# Patient Record
Sex: Female | Born: 1965 | Race: White | Hispanic: No | Smoking: Former smoker
Health system: Southern US, Community
[De-identification: ages and names within clinical notes are randomized; demographics above are authoritative.]

## PROBLEM LIST (undated history)

## (undated) DIAGNOSIS — J45909 Unspecified asthma, uncomplicated: Secondary | ICD-10-CM

## (undated) DIAGNOSIS — E079 Disorder of thyroid, unspecified: Secondary | ICD-10-CM

## (undated) DIAGNOSIS — U071 COVID-19: Secondary | ICD-10-CM

## (undated) DIAGNOSIS — I10 Essential (primary) hypertension: Secondary | ICD-10-CM

## (undated) DIAGNOSIS — F419 Anxiety disorder, unspecified: Secondary | ICD-10-CM

## (undated) DIAGNOSIS — E039 Hypothyroidism, unspecified: Secondary | ICD-10-CM

## (undated) DIAGNOSIS — D472 Monoclonal gammopathy: Secondary | ICD-10-CM

## (undated) DIAGNOSIS — K219 Gastro-esophageal reflux disease without esophagitis: Secondary | ICD-10-CM

## (undated) DIAGNOSIS — M1990 Unspecified osteoarthritis, unspecified site: Secondary | ICD-10-CM

## (undated) DIAGNOSIS — F32A Depression, unspecified: Secondary | ICD-10-CM

## (undated) DIAGNOSIS — J189 Pneumonia, unspecified organism: Secondary | ICD-10-CM

## (undated) DIAGNOSIS — F329 Major depressive disorder, single episode, unspecified: Secondary | ICD-10-CM

## (undated) HISTORY — DX: Depression, unspecified: F32.A

## (undated) HISTORY — DX: Anxiety disorder, unspecified: F41.9

## (undated) HISTORY — DX: Essential (primary) hypertension: I10

## (undated) HISTORY — PX: BREAST SURGERY: SHX581

## (undated) HISTORY — PX: HERNIA REPAIR: SHX51

## (undated) HISTORY — DX: Disorder of thyroid, unspecified: E07.9

## (undated) HISTORY — DX: Major depressive disorder, single episode, unspecified: F32.9

## (undated) HISTORY — PX: TOTAL THYROIDECTOMY: SHX2547

## (undated) HISTORY — PX: OTHER SURGICAL HISTORY: SHX169

## (undated) HISTORY — DX: Gastro-esophageal reflux disease without esophagitis: K21.9

## (undated) HISTORY — DX: Monoclonal gammopathy: D47.2

## (undated) HISTORY — DX: Unspecified osteoarthritis, unspecified site: M19.90

## (undated) HISTORY — DX: COVID-19: U07.1

## (undated) HISTORY — PX: NASAL SINUS SURGERY: SHX719

## (undated) HISTORY — PX: TUBAL LIGATION: SHX77

## (undated) HISTORY — PX: ENDOMETRIAL ABLATION: SHX621

## (undated) HISTORY — DX: Unspecified asthma, uncomplicated: J45.909

## (undated) MED FILL — Iron Sucrose Inj 20 MG/ML (Fe Equiv): INTRAVENOUS | Qty: 10 | Status: AC

---

## 1996-06-17 HISTORY — PX: BREAST SURGERY: SHX581

## 1996-06-17 HISTORY — PX: BREAST CYST ASPIRATION: SHX578

## 2003-06-18 HISTORY — PX: GASTRIC BYPASS: SHX52

## 2004-06-17 HISTORY — PX: ABLATION: SHX5711

## 2005-09-20 ENCOUNTER — Emergency Department: Payer: Self-pay | Admitting: Emergency Medicine

## 2006-02-06 ENCOUNTER — Ambulatory Visit: Payer: Self-pay | Admitting: Obstetrics and Gynecology

## 2006-02-11 ENCOUNTER — Ambulatory Visit: Payer: Self-pay | Admitting: Internal Medicine

## 2006-02-15 ENCOUNTER — Ambulatory Visit: Payer: Self-pay | Admitting: Internal Medicine

## 2006-03-17 ENCOUNTER — Ambulatory Visit: Payer: Self-pay | Admitting: Internal Medicine

## 2006-04-17 ENCOUNTER — Ambulatory Visit: Payer: Self-pay | Admitting: Internal Medicine

## 2006-06-13 ENCOUNTER — Ambulatory Visit: Payer: Self-pay | Admitting: Internal Medicine

## 2006-06-17 ENCOUNTER — Ambulatory Visit: Payer: Self-pay | Admitting: Internal Medicine

## 2006-08-16 ENCOUNTER — Ambulatory Visit: Payer: Self-pay | Admitting: Internal Medicine

## 2006-08-26 ENCOUNTER — Ambulatory Visit: Payer: Self-pay | Admitting: Oncology

## 2006-09-16 ENCOUNTER — Ambulatory Visit: Payer: Self-pay | Admitting: Internal Medicine

## 2006-09-16 ENCOUNTER — Ambulatory Visit: Payer: Self-pay | Admitting: Oncology

## 2006-10-21 ENCOUNTER — Ambulatory Visit: Payer: Self-pay | Admitting: Internal Medicine

## 2006-11-16 ENCOUNTER — Ambulatory Visit: Payer: Self-pay | Admitting: Internal Medicine

## 2006-12-16 ENCOUNTER — Ambulatory Visit: Payer: Self-pay | Admitting: Internal Medicine

## 2007-01-16 ENCOUNTER — Ambulatory Visit: Payer: Self-pay | Admitting: Internal Medicine

## 2007-01-26 ENCOUNTER — Emergency Department: Payer: Self-pay | Admitting: Emergency Medicine

## 2007-03-10 ENCOUNTER — Ambulatory Visit: Payer: Self-pay | Admitting: Internal Medicine

## 2007-03-18 ENCOUNTER — Ambulatory Visit: Payer: Self-pay | Admitting: Internal Medicine

## 2007-06-02 ENCOUNTER — Ambulatory Visit: Payer: Self-pay | Admitting: Internal Medicine

## 2007-06-18 ENCOUNTER — Ambulatory Visit: Payer: Self-pay | Admitting: Internal Medicine

## 2007-08-16 ENCOUNTER — Ambulatory Visit: Payer: Self-pay | Admitting: Internal Medicine

## 2007-08-24 ENCOUNTER — Ambulatory Visit: Payer: Self-pay | Admitting: Internal Medicine

## 2007-09-16 ENCOUNTER — Ambulatory Visit: Payer: Self-pay | Admitting: Internal Medicine

## 2007-11-24 ENCOUNTER — Ambulatory Visit: Payer: Self-pay | Admitting: Internal Medicine

## 2007-12-16 ENCOUNTER — Ambulatory Visit: Payer: Self-pay | Admitting: Internal Medicine

## 2008-02-26 ENCOUNTER — Ambulatory Visit: Payer: Self-pay | Admitting: Internal Medicine

## 2008-03-17 ENCOUNTER — Ambulatory Visit: Payer: Self-pay | Admitting: Internal Medicine

## 2008-04-12 ENCOUNTER — Emergency Department: Payer: Self-pay | Admitting: Emergency Medicine

## 2008-08-15 ENCOUNTER — Ambulatory Visit: Payer: Self-pay | Admitting: Internal Medicine

## 2008-08-24 ENCOUNTER — Ambulatory Visit: Payer: Self-pay | Admitting: Internal Medicine

## 2008-09-15 ENCOUNTER — Ambulatory Visit: Payer: Self-pay | Admitting: Internal Medicine

## 2008-11-18 ENCOUNTER — Ambulatory Visit: Payer: Self-pay | Admitting: Internal Medicine

## 2008-12-15 ENCOUNTER — Ambulatory Visit: Payer: Self-pay | Admitting: Internal Medicine

## 2009-02-13 ENCOUNTER — Ambulatory Visit: Payer: Self-pay | Admitting: Internal Medicine

## 2009-02-15 ENCOUNTER — Ambulatory Visit: Payer: Self-pay | Admitting: Internal Medicine

## 2009-05-10 ENCOUNTER — Ambulatory Visit: Payer: Self-pay | Admitting: Internal Medicine

## 2009-05-17 ENCOUNTER — Ambulatory Visit: Payer: Self-pay | Admitting: Internal Medicine

## 2009-07-18 ENCOUNTER — Ambulatory Visit: Payer: Self-pay | Admitting: Internal Medicine

## 2009-08-02 ENCOUNTER — Ambulatory Visit: Payer: Self-pay | Admitting: Internal Medicine

## 2009-08-15 ENCOUNTER — Ambulatory Visit: Payer: Self-pay | Admitting: Internal Medicine

## 2009-11-01 ENCOUNTER — Ambulatory Visit: Payer: Self-pay | Admitting: Internal Medicine

## 2009-11-15 ENCOUNTER — Ambulatory Visit: Payer: Self-pay | Admitting: Internal Medicine

## 2010-01-17 ENCOUNTER — Ambulatory Visit: Payer: Self-pay | Admitting: Internal Medicine

## 2010-02-15 ENCOUNTER — Ambulatory Visit: Payer: Self-pay | Admitting: Internal Medicine

## 2010-04-11 ENCOUNTER — Ambulatory Visit: Payer: Self-pay | Admitting: Internal Medicine

## 2010-04-17 ENCOUNTER — Ambulatory Visit: Payer: Self-pay | Admitting: Internal Medicine

## 2010-07-13 ENCOUNTER — Ambulatory Visit: Payer: Self-pay | Admitting: Internal Medicine

## 2010-07-18 ENCOUNTER — Ambulatory Visit: Payer: Self-pay | Admitting: Internal Medicine

## 2010-10-08 ENCOUNTER — Ambulatory Visit: Payer: Self-pay | Admitting: Internal Medicine

## 2010-10-16 ENCOUNTER — Ambulatory Visit: Payer: Self-pay | Admitting: Internal Medicine

## 2011-01-13 ENCOUNTER — Ambulatory Visit: Payer: Self-pay | Admitting: Internal Medicine

## 2011-04-04 ENCOUNTER — Ambulatory Visit: Payer: Self-pay | Admitting: Internal Medicine

## 2011-04-18 ENCOUNTER — Ambulatory Visit: Payer: Self-pay | Admitting: Internal Medicine

## 2011-04-22 LAB — HM MAMMOGRAPHY: HM Mammogram: NORMAL

## 2011-07-02 ENCOUNTER — Ambulatory Visit: Payer: Self-pay | Admitting: Internal Medicine

## 2011-07-02 LAB — CBC CANCER CENTER
Comment - H1-Com2: NORMAL
Eosinophil: 1 %
Lymphocytes: 35 %
MCV: 90 fL (ref 80–100)
Monocytes: 7 %
Platelet: 311 x10 3/mm (ref 150–440)
RDW: 13.4 % (ref 11.5–14.5)
Segmented Neutrophils: 57 %
WBC: 8.2 x10 3/mm (ref 3.6–11.0)

## 2011-07-02 LAB — IRON AND TIBC
Iron Bind.Cap.(Total): 389 ug/dL (ref 250–450)
Iron: 112 ug/dL (ref 50–170)
Unbound Iron-Bind.Cap.: 277 ug/dL

## 2011-07-19 ENCOUNTER — Ambulatory Visit: Payer: Self-pay | Admitting: Internal Medicine

## 2011-09-09 ENCOUNTER — Ambulatory Visit: Payer: Self-pay | Admitting: Family

## 2011-10-10 ENCOUNTER — Ambulatory Visit: Payer: Self-pay | Admitting: Family

## 2011-11-22 ENCOUNTER — Ambulatory Visit: Payer: Self-pay | Admitting: Internal Medicine

## 2011-11-22 LAB — IRON AND TIBC
Iron Saturation: 18 %
Unbound Iron-Bind.Cap.: 336 ug/dL

## 2011-12-16 ENCOUNTER — Ambulatory Visit: Payer: Self-pay | Admitting: Internal Medicine

## 2012-02-14 ENCOUNTER — Ambulatory Visit: Payer: Self-pay | Admitting: Internal Medicine

## 2012-02-14 LAB — FERRITIN: Ferritin (ARMC): 31 ng/mL (ref 8–388)

## 2012-02-14 LAB — CANCER CENTER HEMOGLOBIN: HGB: 13 g/dL (ref 12.0–16.0)

## 2012-02-14 LAB — IRON AND TIBC
Iron Saturation: 32 %
Unbound Iron-Bind.Cap.: 249 ug/dL

## 2012-02-16 ENCOUNTER — Ambulatory Visit: Payer: Self-pay | Admitting: Internal Medicine

## 2012-02-24 ENCOUNTER — Encounter: Payer: Self-pay | Admitting: Internal Medicine

## 2012-03-11 ENCOUNTER — Ambulatory Visit: Payer: Self-pay | Admitting: Internal Medicine

## 2012-04-21 ENCOUNTER — Ambulatory Visit (INDEPENDENT_AMBULATORY_CARE_PROVIDER_SITE_OTHER): Payer: No Typology Code available for payment source | Admitting: Internal Medicine

## 2012-04-21 ENCOUNTER — Encounter: Payer: Self-pay | Admitting: Internal Medicine

## 2012-04-21 VITALS — BP 130/90 | HR 78 | Temp 98.1°F | Ht 63.25 in | Wt 231.0 lb

## 2012-04-21 DIAGNOSIS — E669 Obesity, unspecified: Secondary | ICD-10-CM

## 2012-04-21 DIAGNOSIS — F419 Anxiety disorder, unspecified: Secondary | ICD-10-CM | POA: Insufficient documentation

## 2012-04-21 DIAGNOSIS — F329 Major depressive disorder, single episode, unspecified: Secondary | ICD-10-CM | POA: Insufficient documentation

## 2012-04-21 DIAGNOSIS — G4700 Insomnia, unspecified: Secondary | ICD-10-CM

## 2012-04-21 DIAGNOSIS — F341 Dysthymic disorder: Secondary | ICD-10-CM | POA: Insufficient documentation

## 2012-04-21 DIAGNOSIS — F32A Depression, unspecified: Secondary | ICD-10-CM

## 2012-04-21 DIAGNOSIS — E039 Hypothyroidism, unspecified: Secondary | ICD-10-CM | POA: Insufficient documentation

## 2012-04-21 DIAGNOSIS — E890 Postprocedural hypothyroidism: Secondary | ICD-10-CM | POA: Insufficient documentation

## 2012-04-21 MED ORDER — ALPRAZOLAM 1 MG PO TABS: 1.0000 mg | ORAL_TABLET | Freq: Three times a day (TID) | ORAL | Status: DC | PRN

## 2012-04-21 MED ORDER — SERTRALINE HCL 25 MG PO TABS: 25.0000 mg | ORAL_TABLET | Freq: Every day | ORAL | Status: DC

## 2012-04-21 MED ORDER — ZOLPIDEM TARTRATE 10 MG PO TABS: 10.0000 mg | ORAL_TABLET | Freq: Every evening | ORAL | Status: DC | PRN

## 2012-04-21 NOTE — Progress Notes (Signed)
Subjective:    Patient ID: Paula Garrett, female    DOB: 05/13/1966, 46 y.o.   MRN: [id number]  HPI 46 year old female with history of hypothyroidism, obesity status post gastric bypass, anxiety/depression, and insomnia presents to establish care. She is tearful today describing recent issues with ongoing anxiety and depression. She reports that previous PCP changed her medication from Wellbutrin and Pristique to Wellbutrin and Effexor. She reports significant worsening of her symptoms of depression with this change. She also reports that she was previously on alprazolam and this was changed to Clonopin was significant worsening of anxiety. She uses this medication for panic attacks with little improvement. She reports that symptoms of depression and anxiety first began after thyroidectomy and gastric bypass surgery. Ongoing issues with anxiety and depression have worsened as she has gained weight after gastric bypass surgery. She also feels that limited sleep is playing a role in her symptoms. She works as a Industrial/product designer. Her shift varies from day-to-day. She often has difficulty sleeping, and in the past used Ambien with improvement. However, her last PCP took her off this medication and she is now having difficulty sleeping. She has difficulty mostly falling asleep. She notes fatigue during the day.  Outpatient Encounter Prescriptions as of 04/21/2012  Medication Sig Dispense Refill  . buPROPion (WELLBUTRIN XL) 300 MG 24 hr tablet Take 300 mg by mouth daily.      . calcitRIOL (ROCALTROL) 0.5 MCG capsule Take 0.5 mcg by mouth once a week.      . ergocalciferol (VITAMIN D2) 50000 UNITS capsule Take 50,000 Units by mouth once a week.      . folic acid (FOLVITE) 1 MG tablet Take 1 mg by mouth daily.      Marland Kitchen levothyroxine (SYNTHROID, LEVOTHROID) 200 MCG tablet Take 200 mcg by mouth daily.      . metroNIDAZOLE (METROGEL) 0.75 % gel Apply 1 application topically 2 (two) times daily.      . Multiple Vitamin  (MULTIVITAMIN) tablet Take 1 tablet by mouth daily.      Marland Kitchen omeprazole (PRILOSEC) 40 MG capsule Take 40 mg by mouth daily.      . traMADol (ULTRAM) 50 MG tablet Take 50 mg by mouth 2 (two) times daily as needed.      . vitamin B-12 (CYANOCOBALAMIN) 1000 MCG tablet Take 1,000 mcg by mouth daily.      . [DISCONTINUED] clonazePAM (KLONOPIN) 1 MG tablet Take 1 mg by mouth 2 (two) times daily.      . [DISCONTINUED] venlafaxine XR (EFFEXOR-XR) 150 MG 24 hr capsule Take 150 mg by mouth daily.      Marland Kitchen ALPRAZolam (XANAX) 1 MG tablet Take 1 tablet (1 mg total) by mouth 3 (three) times daily as needed for sleep or anxiety.  90 tablet  2  . sertraline (ZOLOFT) 25 MG tablet Take 1 tablet (25 mg total) by mouth daily.  30 tablet  3  . zolpidem (AMBIEN) 10 MG tablet Take 1 tablet (10 mg total) by mouth at bedtime as needed for sleep.  30 tablet  3    Review of Systems  Constitutional: Positive for fatigue. Negative for fever, chills, appetite change and unexpected weight change.  HENT: Negative for ear pain, congestion, sore throat, trouble swallowing, neck pain, voice change and sinus pressure.   Eyes: Negative for visual disturbance.  Respiratory: Negative for cough, shortness of breath, wheezing and stridor.   Cardiovascular: Negative for chest pain, palpitations and leg swelling.  Gastrointestinal: Negative for nausea,  vomiting, abdominal pain, diarrhea, constipation, blood in stool, abdominal distention and anal bleeding.  Genitourinary: Negative for dysuria and flank pain.  Musculoskeletal: Negative for myalgias, arthralgias and gait problem.  Skin: Negative for color change and rash.  Neurological: Negative for dizziness and headaches.  Hematological: Negative for adenopathy. Does not bruise/bleed easily.  Psychiatric/Behavioral: Positive for sleep disturbance and dysphoric mood. Negative for suicidal ideas. The patient is nervous/anxious.        Objective:   Physical Exam  Constitutional: She is  oriented to person, place, and time. She appears well-developed and well-nourished. No distress.  HENT:  Head: Normocephalic and atraumatic.  Right Ear: External ear normal.  Left Ear: External ear normal.  Nose: Nose normal.  Mouth/Throat: Oropharynx is clear and moist. No oropharyngeal exudate.  Eyes: Conjunctivae normal are normal. Pupils are equal, round, and reactive to light. Right eye exhibits no discharge. Left eye exhibits no discharge. No scleral icterus.  Neck: Normal range of motion. Neck supple. No tracheal deviation present. No thyromegaly present.  Cardiovascular: Normal rate, regular rhythm, normal heart sounds and intact distal pulses.  Exam reveals no gallop and no friction rub.   No murmur heard. Pulmonary/Chest: Effort normal and breath sounds normal. No respiratory distress. She has no wheezes. She has no rales. She exhibits no tenderness.  Musculoskeletal: Normal range of motion. She exhibits no edema and no tenderness.  Lymphadenopathy:    She has no cervical adenopathy.  Neurological: She is alert and oriented to person, place, and time. No cranial nerve deficit. She exhibits normal muscle tone. Coordination normal.  Skin: Skin is warm and dry. No rash noted. She is not diaphoretic. No erythema. No pallor.  Psychiatric: Her speech is normal and behavior is normal. Judgment and thought content normal. Her mood appears anxious. Cognition and memory are normal. She exhibits a depressed mood.          Assessment & Plan:

## 2012-04-21 NOTE — Assessment & Plan Note (Signed)
Patient with severe symptoms of anxiety and depression which have recently worsened after change in medications by previous PCP. Will try stopping Effexor and changing to Zoloft 20 mg daily. We'll stop Clonopin and change back to alprazolam as this worked better for her. We'll continue Wellbutrin. Patient would likely benefit from counseling, however reluctant to start at this point. Followup for recheck in one month.

## 2012-04-21 NOTE — Assessment & Plan Note (Signed)
Recent TSH was normal per outside labs. We'll continue Synthroid at current dose. Followup in one month.

## 2012-04-21 NOTE — Assessment & Plan Note (Signed)
BMI 40. Suspect a recent weight gain is exacerbated by shift work schedule. Encouraged patient to take time for herself. Will review records on previous Gastric Bypass surgery and evaluation. Follow up 1 month.

## 2012-04-21 NOTE — Assessment & Plan Note (Signed)
Symptoms of insomnia exacerbated by shift work. Insomnia likely exacerbating symptoms of anxiety and depression. Patient did well with Ambien. Will restart medication. Followup one month.

## 2012-04-28 ENCOUNTER — Encounter: Payer: Self-pay | Admitting: Internal Medicine

## 2012-04-28 DIAGNOSIS — F419 Anxiety disorder, unspecified: Secondary | ICD-10-CM

## 2012-05-07 MED ORDER — SERTRALINE HCL 50 MG PO TABS: 50.0000 mg | ORAL_TABLET | Freq: Every day | ORAL | Status: DC

## 2012-05-21 ENCOUNTER — Encounter: Payer: Self-pay | Admitting: Internal Medicine

## 2012-05-21 ENCOUNTER — Ambulatory Visit (INDEPENDENT_AMBULATORY_CARE_PROVIDER_SITE_OTHER): Payer: No Typology Code available for payment source | Admitting: Internal Medicine

## 2012-05-21 VITALS — BP 140/90 | HR 77 | Temp 97.9°F | Resp 16 | Ht 63.5 in | Wt 232.0 lb

## 2012-05-21 DIAGNOSIS — F419 Anxiety disorder, unspecified: Secondary | ICD-10-CM

## 2012-05-21 DIAGNOSIS — J40 Bronchitis, not specified as acute or chronic: Secondary | ICD-10-CM | POA: Insufficient documentation

## 2012-05-21 DIAGNOSIS — F341 Dysthymic disorder: Secondary | ICD-10-CM

## 2012-05-21 MED ORDER — LEVOFLOXACIN 750 MG PO TABS: 750.0000 mg | ORAL_TABLET | Freq: Every day | ORAL | Status: AC

## 2012-05-21 MED ORDER — PREDNISONE (PAK) 10 MG PO TABS: ORAL_TABLET | ORAL | Status: DC

## 2012-05-21 MED ORDER — SERTRALINE HCL 100 MG PO TABS: 100.0000 mg | ORAL_TABLET | Freq: Every day | ORAL | Status: DC

## 2012-05-21 MED ORDER — ALBUTEROL SULFATE HFA 108 (90 BASE) MCG/ACT IN AERS: 2.0000 | INHALATION_SPRAY | Freq: Four times a day (QID) | RESPIRATORY_TRACT | Status: DC | PRN

## 2012-05-21 NOTE — Progress Notes (Signed)
Subjective:    Patient ID: Paula Garrett, female    DOB: Sep 09, 1965, 46 y.o.   MRN: [id number]  HPI 46 year old female with history of obesity, anxiety/depression, hypothyroidism presents for acute visit complaining of a two-week history of cough productive of purulent sputum, sinus congestion, frontal headache, shortness of breath. She denies any fever, chills, chest pain. She has been taking over-the-counter Mucinex with no improvement in her symptoms.  In regards to history of anxiety and depression, she was recently started on Zoloft. She reports some improvement in her symptoms on this medication. She denies any side effects from this medication. However, she continues to have some apathy and depressed mood. She questions whether increasing dose might be helpful.  Outpatient Encounter Prescriptions as of 05/21/2012  Medication Sig Dispense Refill  . ALPRAZolam (XANAX) 1 MG tablet Take 1 tablet (1 mg total) by mouth 3 (three) times daily as needed for sleep or anxiety.  90 tablet  2  . buPROPion (WELLBUTRIN XL) 300 MG 24 hr tablet Take 300 mg by mouth daily.      . calcitRIOL (ROCALTROL) 0.5 MCG capsule Take 0.5 mcg by mouth once a week.      . folic acid (FOLVITE) 1 MG tablet Take 1 mg by mouth daily.      Marland Kitchen levothyroxine (SYNTHROID, LEVOTHROID) 200 MCG tablet Take 200 mcg by mouth daily.      . metroNIDAZOLE (METROGEL) 0.75 % gel Apply 1 application topically 2 (two) times daily.      . Multiple Vitamin (MULTIVITAMIN) tablet Take 1 tablet by mouth daily.      Marland Kitchen omeprazole (PRILOSEC) 40 MG capsule Take 40 mg by mouth daily.      . sertraline (ZOLOFT) 100 MG tablet Take 1 tablet (100 mg total) by mouth daily.  30 tablet  6  . traMADol (ULTRAM) 50 MG tablet Take 50 mg by mouth 2 (two) times daily as needed.      . vitamin B-12 (CYANOCOBALAMIN) 1000 MCG tablet Take 1,000 mcg by mouth daily.      Marland Kitchen zolpidem (AMBIEN) 10 MG tablet Take 1 tablet (10 mg total) by mouth at bedtime as needed for  sleep.  30 tablet  3  . [DISCONTINUED] ergocalciferol (VITAMIN D2) 50000 UNITS capsule Take 50,000 Units by mouth once a week.      . [DISCONTINUED] sertraline (ZOLOFT) 50 MG tablet Take 1 tablet (50 mg total) by mouth daily.  30 tablet  6  . albuterol (PROVENTIL HFA;VENTOLIN HFA) 108 (90 BASE) MCG/ACT inhaler Inhale 2 puffs into the lungs every 6 (six) hours as needed for wheezing.  1 Inhaler  0  . levofloxacin (LEVAQUIN) 750 MG tablet Take 1 tablet (750 mg total) by mouth daily.  7 tablet  0  . predniSONE (STERAPRED UNI-PAK) 10 MG tablet Take 60mg  day 1 then taper by 10mg  daily  21 tablet  0    Review of Systems  Constitutional: Negative for fever, chills, appetite change, fatigue and unexpected weight change.  HENT: Positive for congestion, postnasal drip and sinus pressure. Negative for ear pain, sore throat, trouble swallowing, neck pain and voice change.   Eyes: Negative for visual disturbance.  Respiratory: Positive for cough and shortness of breath. Negative for wheezing and stridor.   Cardiovascular: Negative for chest pain, palpitations and leg swelling.  Gastrointestinal: Negative for nausea, vomiting, abdominal pain, diarrhea, constipation, blood in stool, abdominal distention and anal bleeding.  Genitourinary: Negative for dysuria and flank pain.  Musculoskeletal: Negative for myalgias,  arthralgias and gait problem.  Skin: Negative for color change and rash.  Neurological: Negative for dizziness and headaches.  Hematological: Negative for adenopathy. Does not bruise/bleed easily.  Psychiatric/Behavioral: Positive for dysphoric mood. Negative for suicidal ideas and sleep disturbance. The patient is nervous/anxious.        Objective:   Physical Exam  Constitutional: She is oriented to person, place, and time. She appears well-developed and well-nourished. No distress.  HENT:  Head: Normocephalic and atraumatic.  Right Ear: External ear normal.  Left Ear: External ear normal.   Nose: Nose normal.  Mouth/Throat: Oropharynx is clear and moist. No oropharyngeal exudate.  Eyes: Conjunctivae normal are normal. Pupils are equal, round, and reactive to light. Right eye exhibits no discharge. Left eye exhibits no discharge. No scleral icterus.  Neck: Normal range of motion. Neck supple. No tracheal deviation present. No thyromegaly present.  Cardiovascular: Normal rate, regular rhythm, normal heart sounds and intact distal pulses.  Exam reveals no gallop and no friction rub.   No murmur heard. Pulmonary/Chest: Effort normal. No accessory muscle usage. Not tachypneic. No respiratory distress. She has no decreased breath sounds. She has wheezes (scattered). She has rhonchi. She has no rales. She exhibits no tenderness.  Musculoskeletal: Normal range of motion. She exhibits no edema and no tenderness.  Lymphadenopathy:    She has no cervical adenopathy.  Neurological: She is alert and oriented to person, place, and time. No cranial nerve deficit. She exhibits normal muscle tone. Coordination normal.  Skin: Skin is warm and dry. No rash noted. She is not diaphoretic. No erythema. No pallor.  Psychiatric: Her speech is normal and behavior is normal. Judgment and thought content normal. She exhibits a depressed mood.          Assessment & Plan:

## 2012-05-21 NOTE — Patient Instructions (Signed)
[facility]

## 2012-05-21 NOTE — Assessment & Plan Note (Signed)
Symptoms are improved with use of Zoloft however patient continues to have some anxious and depressed mood. Will try increasing Zoloft to 100 mg daily. We discussed the potential interaction of Zoloft and Wellbutrin when used at higher doses. If she has any problems with these medications she will call or return to clinic. Followup 2 weeks.

## 2012-05-21 NOTE — Assessment & Plan Note (Signed)
Symptoms are consistent with acute bronchitis. Will treat with Levaquin and prednisone taper. We'll have albuterol as needed for cough, dyspnea, or wheezing. Patient will call his symptoms are not improving over the next 72 hours. Followup in 2 weeks.

## 2012-05-29 ENCOUNTER — Telehealth: Payer: Self-pay | Admitting: Internal Medicine

## 2012-05-29 NOTE — Telephone Encounter (Signed)
Patient Information:  Caller Name: Breona  Phone: [phone]  Patient: Paula Garrett, Paula Garrett  Gender: Female  DOB: [birth year]  Age: 46 Years  PCP: Ronna Polio (Adults only)  Pregnant: No  Office Follow Up:  Does the office need to follow up with this patient?: Yes  Instructions For The Office: Please follow up with patient if additional medication recommendations.  Patient was seen on 05-21-12 and treated with Levaquin and Prednisone and is still having Chest congestion, and ear pain.  RN Note:  Has noted some improvement with chest congestion states no longer choking on the phlegm.  Still has intermittent wheezing which is resolved by the albuterol. Mild right ear pain. Pain was present with evaluation and has worsened since last seen.  No drainage noted. Patient does not want to go to Urgent care today would like to check with provider to see if any additional medication recommendations.  If no follow up provided since end of day will call office on Monday to set up appointment for re-evaluation.  Symptoms  Reason For Call & Symptoms: Chest/Sinus congestion  Reviewed Health History In EMR: Yes  Reviewed Medications In EMR: Yes  Reviewed Allergies In EMR: Yes  Reviewed Surgeries / Procedures: Yes  Date of Onset of Symptoms: 05/14/2012  Treatments Tried: Patient on Prednisone taper and Levaquin  Treatments Tried Worked: Yes OB:  LMP: Unknown  Guideline(s) Used:  Colds  Disposition Per Guideline:   See Today in Office  Reason For Disposition Reached:   Earache  Advice Given:  For a Stuffy Nose - Use Nasal Washes:  Introduction: Saline (salt water) nasal irrigation (nasal wash) is an effective and simple home remedy for treating stuffy nose and sinus congestion. The nose can be irrigated by pouring, spraying, or squirting salt water into the nose and then letting it run back out.  How it Helps: The salt water rinses out excess mucus, washes out any irritants (dust, allergens)  that might be present, and moistens the nasal cavity.  Methods: There are several ways to perform nasal irrigation. You can use a saline nasal spray bottle (available over-the-counter), a rubber ear syringe, a medical syringe without the needle, or a Neti Pot.  Humidifier:  If the air in your home is dry, use a cool-mist humidifier  Patient Refused Recommendation:  Patient Will Follow Up With Office Later  Patient does not want to go to Urgent care and will follow up with office on Monday if provider does not call in mediction prior to based on office note.

## 2012-05-29 NOTE — Telephone Encounter (Signed)
Pt will need to be seen again if symptoms persistent. May either go to urgent care or [facility] over the weekend for Saturday clinic, or RTC here next week.

## 2012-06-01 NOTE — Telephone Encounter (Signed)
Pt has an appt with Dr. Lorin Picket tomorrow.

## 2012-06-02 ENCOUNTER — Encounter: Payer: Self-pay | Admitting: Internal Medicine

## 2012-06-02 ENCOUNTER — Ambulatory Visit (INDEPENDENT_AMBULATORY_CARE_PROVIDER_SITE_OTHER): Payer: No Typology Code available for payment source | Admitting: Internal Medicine

## 2012-06-02 VITALS — BP 132/80 | HR 83 | Temp 98.1°F | Ht 63.25 in | Wt 233.0 lb

## 2012-06-02 DIAGNOSIS — J069 Acute upper respiratory infection, unspecified: Secondary | ICD-10-CM

## 2012-06-02 MED ORDER — FIRST-DUKES MOUTHWASH MT SUSP: OROMUCOSAL | Status: DC

## 2012-06-02 MED ORDER — FLUTICASONE PROPIONATE 50 MCG/ACT NA SUSP: 2.0000 | Freq: Every day | NASAL | Status: DC

## 2012-06-02 MED ORDER — PREDNISONE 10 MG PO TABS: ORAL_TABLET | ORAL | Status: DC

## 2012-06-02 MED ORDER — CEFDINIR 300 MG PO CAPS: 300.0000 mg | ORAL_CAPSULE | Freq: Two times a day (BID) | ORAL | Status: DC

## 2012-06-02 NOTE — Patient Instructions (Addendum)
It was nice meeting you today.  I am sorry you are not feeling well.  I am going to give you an antibiotic [name]) - take one capsule 2x/day.  Flush with saline nasal spray 2-3x/day and use Flonase nasal spray as directed in the evening.  Prednisone taper as directed.  I am also going to give you Dukes mouthwash to swish and spit three times per day.  Let us know if your symptoms do not resolve.

## 2012-06-04 ENCOUNTER — Ambulatory Visit: Payer: No Typology Code available for payment source | Admitting: Internal Medicine

## 2012-06-07 ENCOUNTER — Encounter: Payer: Self-pay | Admitting: Internal Medicine

## 2012-06-07 NOTE — Progress Notes (Signed)
  Subjective:    Patient ID: Paula Garrett, female    DOB: 05-Feb-1966, 46 y.o.   MRN: [id number]  HPI 46 year old female with past history of hypothyroidism who comes in today with concerns regarding increased congestion.  Was seen on 05/21/12.  Was placed on Levaquin and prednisone taper.  Feels better, but still has increased congestion.  Congestion has loosened up some.   Still with increased chest congestion.  Head feels full.  Burning in her mouth/tongue.  Increased drainage and sore throat.  Ears feel like they have water in them and are itching.  Occasional subjective fever.  Previously on mucinex.    Past Medical History  Diagnosis Date  . Depression   . Arthritis   . GERD (gastroesophageal reflux disease)   . Thyroid disease     Review of Systems Patient denies any headache, lightheadedness or dizziness. Increased congestion and drainage as outlined.  Sore throat.  Cough occasionally productive.  No chest pain, tightness or palpitations.  No increased shortness of breath.  No nausea or vomiting.  No abdominal pain or cramping.  No bowel change, such as diarrhea.         Objective:   Physical Exam Filed Vitals:   06/02/12 0915  BP: 132/80  Pulse: 83  Temp: 98.1 F (62.59 C)   46 year old female in no acute distress.   HEENT:  Nares - erythematous turbinates.  OP- without lesions or erythema. TMs without erythema.  No significant tenderness to palpation over the sinuses.   NECK:  Supple.  Non tender.     HEART:  Appears to be regular. LUNGS:  Without crackles or wheezing audible.  Respirations even and unlabored.  Some increased congestion.  Clears some with coughing.  Increased cough with forced expiration.        Assessment & Plan:  PROBABLE URI/SINUSITIS.  Treated previously with Levaquin.  Will restart Mucinex/Robitussin as directed.  Saline nasal flushes and flonase nasal spray as directed.  Omnicef 300mg  2/day as directed.  Will also give prednisone taper starting at 40mg   and decreasing by 5mg  each day until down to zero mg.  Rest.  Fluids.  Explained to her if symptoms changed, worsened or did not resolve, she was to be reevaluated.

## 2012-06-09 ENCOUNTER — Encounter: Payer: Self-pay | Admitting: Internal Medicine

## 2012-06-09 ENCOUNTER — Ambulatory Visit (INDEPENDENT_AMBULATORY_CARE_PROVIDER_SITE_OTHER): Payer: No Typology Code available for payment source | Admitting: Internal Medicine

## 2012-06-09 ENCOUNTER — Ambulatory Visit (INDEPENDENT_AMBULATORY_CARE_PROVIDER_SITE_OTHER)
Admission: RE | Admit: 2012-06-09 | Discharge: 2012-06-09 | Disposition: A | Discharge status: Home / Self Care | Payer: No Typology Code available for payment source | Source: Ambulatory Visit | Attending: Internal Medicine | Admitting: Internal Medicine

## 2012-06-09 ENCOUNTER — Other Ambulatory Visit (HOSPITAL_COMMUNITY)
Admission: RE | Admit: 2012-06-09 | Discharge: 2012-06-09 | Disposition: A | Discharge status: Home / Self Care | Payer: No Typology Code available for payment source | Source: Ambulatory Visit | Attending: Internal Medicine | Admitting: Internal Medicine

## 2012-06-09 VITALS — BP 140/80 | HR 85 | Temp 97.6°F | Resp 16 | Ht 63.5 in | Wt 235.8 lb

## 2012-06-09 DIAGNOSIS — R9389 Abnormal findings on diagnostic imaging of other specified body structures: Secondary | ICD-10-CM

## 2012-06-09 DIAGNOSIS — Z1151 Encounter for screening for human papillomavirus (HPV): Secondary | ICD-10-CM | POA: Insufficient documentation

## 2012-06-09 DIAGNOSIS — F329 Major depressive disorder, single episode, unspecified: Secondary | ICD-10-CM

## 2012-06-09 DIAGNOSIS — Z720 Tobacco use: Secondary | ICD-10-CM

## 2012-06-09 DIAGNOSIS — G8929 Other chronic pain: Secondary | ICD-10-CM

## 2012-06-09 DIAGNOSIS — Z0000 Encounter for general adult medical examination without abnormal findings: Secondary | ICD-10-CM

## 2012-06-09 DIAGNOSIS — F419 Anxiety disorder, unspecified: Secondary | ICD-10-CM

## 2012-06-09 DIAGNOSIS — Z01419 Encounter for gynecological examination (general) (routine) without abnormal findings: Secondary | ICD-10-CM | POA: Insufficient documentation

## 2012-06-09 DIAGNOSIS — R05 Cough: Secondary | ICD-10-CM

## 2012-06-09 DIAGNOSIS — F17200 Nicotine dependence, unspecified, uncomplicated: Secondary | ICD-10-CM | POA: Insufficient documentation

## 2012-06-09 DIAGNOSIS — R059 Cough, unspecified: Secondary | ICD-10-CM

## 2012-06-09 DIAGNOSIS — E669 Obesity, unspecified: Secondary | ICD-10-CM

## 2012-06-09 DIAGNOSIS — F341 Dysthymic disorder: Secondary | ICD-10-CM

## 2012-06-09 DIAGNOSIS — R918 Other nonspecific abnormal finding of lung field: Secondary | ICD-10-CM

## 2012-06-09 MED ORDER — TRAMADOL HCL 50 MG PO TABS: 100.0000 mg | ORAL_TABLET | Freq: Two times a day (BID) | ORAL | Status: DC | PRN

## 2012-06-09 NOTE — Assessment & Plan Note (Signed)
BMI 41. Encouraged healthy diet, with limited intake of processed carbohydrates. Also encouraged regular physical activity with a goal of 40 minutes vigorous exercise 3 times per week.

## 2012-06-09 NOTE — Assessment & Plan Note (Signed)
Symptoms are much better controlled on current doses of sertraline and Wellbutrin. Will continue. Followup in 6 months or sooner as needed.

## 2012-06-09 NOTE — Progress Notes (Signed)
Subjective:    Patient ID: Paula Garrett, female    DOB: [birth year], 46 y.o.   MRN: [id number]  HPI 46 year old female with history of anxiety/depression, hypothyroidism, tobacco abuse presents for annual exam. She reports that she is generally doing well. She reports that symptoms of anxiety and depression are better controlled than they have been in the past. She denies any side effects noted from her sertraline or Wellbutrin. She reports that this is the first holiday season that she has been able to enjoy a long time.  In regards to tobacco use, she reports she is planning to quit in early 2014. She has been thinking about using an electronic cigarette.  She was recently evaluated and treated for bronchitis. She reports that symptoms of shortness of breath and cough have resolved. However, she notes that in the past she did have an abnormal chest x-ray, approximately one year ago which was supposed to have a followup study performed. She has not yet had the study performed.  Outpatient Prescriptions Prior to Visit  Medication Sig Dispense Refill  . albuterol (PROVENTIL HFA;VENTOLIN HFA) 108 (90 BASE) MCG/ACT inhaler Inhale 2 puffs into the lungs every 6 (six) hours as needed for wheezing.  1 Inhaler  0  . ALPRAZolam (XANAX) 1 MG tablet Take 1 tablet (1 mg total) by mouth 3 (three) times daily as needed for sleep or anxiety.  90 tablet  2  . buPROPion (WELLBUTRIN XL) 300 MG 24 hr tablet Take 300 mg by mouth daily.      . calcitRIOL (ROCALTROL) 0.5 MCG capsule Take 0.5 mcg by mouth once a week.      . Diphenhyd-Hydrocort-Nystatin (FIRST-DUKES MOUTHWASH) SUSP 10 cc's swish and spit tid  237 mL  0  . fluticasone (FLONASE) 50 MCG/ACT nasal spray Place 2 sprays into the nose daily.  16 g  0  . folic acid (FOLVITE) 1 MG tablet Take 1 mg by mouth daily.      Marland Kitchen levothyroxine (SYNTHROID, LEVOTHROID) 200 MCG tablet Take 200 mcg by mouth daily.      . metroNIDAZOLE (METROGEL) 0.75 % gel Apply 1  application topically 2 (two) times daily.      . Multiple Vitamin (MULTIVITAMIN) tablet Take 1 tablet by mouth daily.      Marland Kitchen omeprazole (PRILOSEC) 40 MG capsule Take 40 mg by mouth daily.      . sertraline (ZOLOFT) 100 MG tablet Take 1 tablet (100 mg total) by mouth daily.  30 tablet  6  . vitamin B-12 (CYANOCOBALAMIN) 1000 MCG tablet Take 1,000 mcg by mouth daily.      Marland Kitchen zolpidem (AMBIEN) 10 MG tablet Take 1 tablet (10 mg total) by mouth at bedtime as needed for sleep.  30 tablet  3   Last reviewed on 06/09/2012  9:55 AM by Jackson Latino, CMA  BP 140/80  Pulse 85  Temp 97.6 F (36.4 C) (Oral)  Resp 16  Ht 5' 3.5" (1.613 m)  Wt 235 lb 12 oz (106.935 kg)  BMI 41.11 kg/m2  SpO2 96%  Review of Systems  Constitutional: Negative for fever, chills, appetite change, fatigue and unexpected weight change.  HENT: Negative for ear pain, congestion, sore throat, trouble swallowing, neck pain, voice change and sinus pressure.   Eyes: Negative for visual disturbance.  Respiratory: Positive for cough. Negative for shortness of breath, wheezing and stridor.   Cardiovascular: Negative for chest pain, palpitations and leg swelling.  Gastrointestinal: Negative for nausea, vomiting, abdominal pain, diarrhea, constipation,  blood in stool, abdominal distention and anal bleeding.  Genitourinary: Negative for dysuria and flank pain.  Musculoskeletal: Negative for myalgias, arthralgias and gait problem.  Skin: Negative for color change and rash.  Neurological: Negative for dizziness and headaches.  Hematological: Negative for adenopathy. Does not bruise/bleed easily.  Psychiatric/Behavioral: Negative for suicidal ideas, sleep disturbance and dysphoric mood. The patient is not nervous/anxious.        Objective:   Physical Exam  Constitutional: She is oriented to person, place, and time. She appears well-developed and well-nourished. No distress.  HENT:  Head: Normocephalic and atraumatic.  Right  Ear: External ear normal.  Left Ear: External ear normal.  Nose: Nose normal.  Mouth/Throat: Oropharynx is clear and moist. No oropharyngeal exudate.  Eyes: Conjunctivae normal are normal. Pupils are equal, round, and reactive to light. Right eye exhibits no discharge. Left eye exhibits no discharge. No scleral icterus.  Neck: Normal range of motion. Neck supple. No tracheal deviation present. No thyromegaly present.  Cardiovascular: Normal rate, regular rhythm, normal heart sounds and intact distal pulses.  Exam reveals no gallop and no friction rub.   No murmur heard. Pulmonary/Chest: Effort normal and breath sounds normal. No respiratory distress. She has no wheezes. She has no rales. She exhibits no tenderness.  Abdominal: Soft. Bowel sounds are normal. She exhibits no distension and no mass. There is no tenderness. There is no rebound and no guarding.  Genitourinary: Rectum normal, vagina normal and uterus normal. No breast swelling, tenderness, discharge or bleeding. Pelvic exam was performed with patient supine. There is no rash, tenderness or lesion on the right labia. There is no rash, tenderness or lesion on the left labia. Uterus is not enlarged and not tender. Cervix exhibits no motion tenderness, no discharge and no friability. Right adnexum displays no mass, no tenderness and no fullness. Left adnexum displays no mass, no tenderness and no fullness. No erythema or tenderness around the vagina. No vaginal discharge found.  Musculoskeletal: Normal range of motion. She exhibits no edema and no tenderness.  Lymphadenopathy:    She has no cervical adenopathy.  Neurological: She is alert and oriented to person, place, and time. No cranial nerve deficit. She exhibits normal muscle tone. Coordination normal.  Skin: Skin is warm and dry. No rash noted. She is not diaphoretic. No erythema. No pallor.  Psychiatric: She has a normal mood and affect. Her behavior is normal. Judgment and thought  content normal.          Assessment & Plan:

## 2012-06-09 NOTE — Assessment & Plan Note (Signed)
General medical exam including breast exam and pelvic exam are normal today. Pap is pending. Health maintenance is up to date, except for mammogram which will be scheduled today. Encourage continued efforts at healthy diet and increase physical activity. Strongly encourage smoking cessation. Reviewed recent labs from November 2013 which showed normal CBC, normal kidney and liver function, normal thyroid function, and normal lipids. Followup for physical exam in one year or sooner as needed.

## 2012-06-09 NOTE — Assessment & Plan Note (Addendum)
Strongly encouraged smoking cessation. Discussed mechanisms of smoking cessation including the use of Wellbutrin, Chantix, or electronic cigarette for nicotine replacement. Approximately 5 min spent with pt discussing ongoing tobacco use and methods to assist her with smoking cessation.

## 2012-06-09 NOTE — Assessment & Plan Note (Signed)
Patient reported abnormal chest x-ray in the past. Chest x-ray today shows enlarged area at gastroesophageal junction. Suspect this is related to her history of gastric bypass surgery. However, will get chest CT for further evaluation.

## 2012-06-12 ENCOUNTER — Encounter: Payer: Self-pay | Admitting: Internal Medicine

## 2012-06-12 ENCOUNTER — Ambulatory Visit (HOSPITAL_COMMUNITY)
Admission: RE | Admit: 2012-06-12 | Discharge: 2012-06-12 | Disposition: A | Discharge status: Home / Self Care | Payer: No Typology Code available for payment source | Source: Ambulatory Visit | Attending: Internal Medicine | Admitting: Internal Medicine

## 2012-06-12 DIAGNOSIS — R05 Cough: Secondary | ICD-10-CM

## 2012-06-12 DIAGNOSIS — R059 Cough, unspecified: Secondary | ICD-10-CM

## 2012-06-12 DIAGNOSIS — J40 Bronchitis, not specified as acute or chronic: Secondary | ICD-10-CM | POA: Insufficient documentation

## 2012-06-12 DIAGNOSIS — K449 Diaphragmatic hernia without obstruction or gangrene: Secondary | ICD-10-CM | POA: Insufficient documentation

## 2012-06-12 DIAGNOSIS — R9389 Abnormal findings on diagnostic imaging of other specified body structures: Secondary | ICD-10-CM

## 2012-06-12 DIAGNOSIS — R918 Other nonspecific abnormal finding of lung field: Secondary | ICD-10-CM | POA: Insufficient documentation

## 2012-06-12 DIAGNOSIS — R0602 Shortness of breath: Secondary | ICD-10-CM | POA: Insufficient documentation

## 2012-06-12 MED ORDER — IOHEXOL 300 MG/ML  SOLN
100.0000 mL | Freq: Once | INTRAMUSCULAR | Status: AC | PRN
  Administered 2012-06-12: 100 mL via INTRAVENOUS

## 2012-06-17 ENCOUNTER — Ambulatory Visit: Payer: Self-pay | Admitting: Internal Medicine

## 2012-06-23 ENCOUNTER — Ambulatory Visit: Payer: Self-pay | Admitting: Internal Medicine

## 2012-07-07 ENCOUNTER — Encounter: Payer: Self-pay | Admitting: Internal Medicine

## 2012-07-19 ENCOUNTER — Other Ambulatory Visit: Payer: Self-pay | Admitting: Internal Medicine

## 2012-07-20 ENCOUNTER — Encounter: Payer: Self-pay | Admitting: Internal Medicine

## 2012-07-20 NOTE — Telephone Encounter (Signed)
Received refill electronically. Is it okay to refill medication.

## 2012-07-22 ENCOUNTER — Other Ambulatory Visit: Payer: Self-pay | Admitting: Internal Medicine

## 2012-07-22 NOTE — Telephone Encounter (Signed)
Pharmacist states the last refill they received was back in November with 2 refills. Is it okay to refill now and number of refills?

## 2012-07-22 NOTE — Telephone Encounter (Signed)
Rx called to pharmacy as instructed. 

## 2012-07-22 NOTE — Telephone Encounter (Signed)
Can we please check with pharmacy? I think that this was filled on 06/19/2012 for a 90 day supply.

## 2012-08-16 ENCOUNTER — Other Ambulatory Visit: Payer: Self-pay | Admitting: Internal Medicine

## 2012-08-28 ENCOUNTER — Encounter: Payer: Self-pay | Admitting: Internal Medicine

## 2012-08-28 MED ORDER — OMEPRAZOLE 40 MG PO CPDR: 40.0000 mg | DELAYED_RELEASE_CAPSULE | Freq: Every day | ORAL | Status: DC

## 2012-08-28 NOTE — Telephone Encounter (Signed)
Sent!

## 2012-09-14 ENCOUNTER — Other Ambulatory Visit: Payer: Self-pay | Admitting: Internal Medicine

## 2012-09-15 ENCOUNTER — Other Ambulatory Visit: Payer: Self-pay | Admitting: Internal Medicine

## 2012-09-27 ENCOUNTER — Other Ambulatory Visit: Payer: Self-pay | Admitting: Internal Medicine

## 2012-09-28 ENCOUNTER — Other Ambulatory Visit: Payer: Self-pay | Admitting: *Deleted

## 2012-09-28 MED ORDER — BUPROPION HCL ER (XL) 300 MG PO TB24: 300.0000 mg | ORAL_TABLET | Freq: Every day | ORAL | Status: DC

## 2012-09-28 NOTE — Telephone Encounter (Signed)
Eprescribed.

## 2012-11-05 ENCOUNTER — Other Ambulatory Visit: Payer: Self-pay | Admitting: Internal Medicine

## 2012-12-02 ENCOUNTER — Ambulatory Visit (INDEPENDENT_AMBULATORY_CARE_PROVIDER_SITE_OTHER): Payer: No Typology Code available for payment source | Admitting: Internal Medicine

## 2012-12-02 ENCOUNTER — Encounter: Payer: Self-pay | Admitting: Internal Medicine

## 2012-12-02 VITALS — BP 126/84 | HR 78 | Temp 98.1°F | Wt 241.0 lb

## 2012-12-02 DIAGNOSIS — E669 Obesity, unspecified: Secondary | ICD-10-CM

## 2012-12-02 DIAGNOSIS — K219 Gastro-esophageal reflux disease without esophagitis: Secondary | ICD-10-CM

## 2012-12-02 DIAGNOSIS — R5381 Other malaise: Secondary | ICD-10-CM | POA: Insufficient documentation

## 2012-12-02 DIAGNOSIS — M25519 Pain in unspecified shoulder: Secondary | ICD-10-CM

## 2012-12-02 DIAGNOSIS — E559 Vitamin D deficiency, unspecified: Secondary | ICD-10-CM | POA: Insufficient documentation

## 2012-12-02 DIAGNOSIS — F329 Major depressive disorder, single episode, unspecified: Secondary | ICD-10-CM

## 2012-12-02 DIAGNOSIS — R5383 Other fatigue: Secondary | ICD-10-CM

## 2012-12-02 DIAGNOSIS — E039 Hypothyroidism, unspecified: Secondary | ICD-10-CM

## 2012-12-02 DIAGNOSIS — M25512 Pain in left shoulder: Secondary | ICD-10-CM

## 2012-12-02 DIAGNOSIS — G8929 Other chronic pain: Secondary | ICD-10-CM | POA: Insufficient documentation

## 2012-12-02 DIAGNOSIS — G4700 Insomnia, unspecified: Secondary | ICD-10-CM

## 2012-12-02 DIAGNOSIS — F341 Dysthymic disorder: Secondary | ICD-10-CM

## 2012-12-02 MED ORDER — OMEPRAZOLE 40 MG PO CPDR: 40.0000 mg | DELAYED_RELEASE_CAPSULE | Freq: Every day | ORAL | Status: DC

## 2012-12-02 MED ORDER — LEVOTHYROXINE SODIUM 112 MCG PO TABS: 224.0000 ug | ORAL_TABLET | Freq: Every day | ORAL | Status: DC

## 2012-12-02 MED ORDER — ERGOCALCIFEROL 1.25 MG (50000 UT) PO CAPS: 50000.0000 [IU] | ORAL_CAPSULE | ORAL | Status: DC

## 2012-12-02 MED ORDER — BUPROPION HCL ER (XL) 300 MG PO TB24: 300.0000 mg | ORAL_TABLET | Freq: Every day | ORAL | Status: DC

## 2012-12-02 MED ORDER — ALPRAZOLAM 1 MG PO TABS: ORAL_TABLET | ORAL | Status: DC

## 2012-12-02 MED ORDER — ZOLPIDEM TARTRATE 10 MG PO TABS: ORAL_TABLET | ORAL | Status: DC

## 2012-12-02 NOTE — Assessment & Plan Note (Signed)
Symptoms most consistent with muscular strain from repetitive movement at her work. For now, we'll use conservative measures including resting her arm, icing and using nonsteroidals. If symptoms are persistent or worsening, discussed referral to sports medicine.

## 2012-12-02 NOTE — Progress Notes (Signed)
Subjective:    Patient ID: Paula Garrett, female    DOB: [birth year], 47 y.o.   MRN: [id number]  HPI 47 year old female with history of obesity status post gastric bypass, depression, anxiety, insomnia, hypothyroidism presents for followup. She reports that she is generally been feeling fatigued. She feels that she sleeps well at night and wakes refreshed. However, after work she is extremely fatigued it is not generally participate in family dinners or activities. She is unsure of depression may be related. She reports compliance with her medications. She is also frustrated by her weight. She reports her weight is back up to levels previous to bariatric surgery.  She also reports intermittent episodes of left shoulder pain. Pain is described as aching in the lateral left shoulder which is exacerbated by movement. She denies any weakness in her left arm. Pain improves without any intervention. She has a previous history of rotator cuff tear in her right shoulder and questions whether this may be contributing on the left.  Outpatient Encounter Prescriptions as of 12/02/2012  Medication Sig Dispense Refill  . ALPRAZolam (XANAX) 1 MG tablet TAKE ONE TABLET BY MOUTH THREE TIMES DAILY AS NEEDED FOR ANXIETY AND FOR SLEEP  90 tablet  1  . buPROPion (WELLBUTRIN XL) 300 MG 24 hr tablet Take 1 tablet (300 mg total) by mouth daily.  30 tablet  6  . cetirizine (ZYRTEC) 10 MG tablet Take 10 mg by mouth daily.      . fluticasone (FLONASE) 50 MCG/ACT nasal spray Place 2 sprays into the nose daily.  16 g  0  . levothyroxine (SYNTHROID, LEVOTHROID) 112 MCG tablet Take 2 tablets (224 mcg total) by mouth daily.  60 tablet  3  . Liniments (SALONPAS ARTHRITIS PAIN RELIEF) PADS Apply topically.      Marland Kitchen omeprazole (PRILOSEC) 40 MG capsule Take 1 capsule (40 mg total) by mouth daily.  30 capsule  11  . sertraline (ZOLOFT) 100 MG tablet Take 1 tablet (100 mg total) by mouth daily.  30 tablet  6  . vitamin B-12  (CYANOCOBALAMIN) 1000 MCG tablet Take 1,000 mcg by mouth daily.      Marland Kitchen zolpidem (AMBIEN) 10 MG tablet TAKE ONE TABLET BY MOUTH AT BEDTIME FOR SLEEP  30 tablet  3   No facility-administered encounter medications on file as of 12/02/2012.   BP 126/84  Pulse 78  Temp(Src) 98.1 F (36.7 C) (Oral)  Wt 241 lb (109.317 kg)  BMI 42.02 kg/m2  SpO2 97%  Review of Systems  Constitutional: Positive for fatigue. Negative for fever, chills, appetite change and unexpected weight change.  HENT: Negative for ear pain, congestion, sore throat, trouble swallowing, neck pain, voice change and sinus pressure.   Eyes: Negative for visual disturbance.  Respiratory: Negative for cough, shortness of breath, wheezing and stridor.   Cardiovascular: Negative for chest pain, palpitations and leg swelling.  Gastrointestinal: Negative for nausea, vomiting, abdominal pain, diarrhea, constipation, blood in stool, abdominal distention and anal bleeding.  Genitourinary: Negative for dysuria and flank pain.  Musculoskeletal: Positive for arthralgias (intermittent left shoulder pain ). Negative for myalgias and gait problem.  Skin: Negative for color change and rash.  Neurological: Negative for dizziness and headaches.  Hematological: Negative for adenopathy. Does not bruise/bleed easily.  Psychiatric/Behavioral: Positive for dysphoric mood. Negative for suicidal ideas and sleep disturbance. The patient is not nervous/anxious.        Objective:   Physical Exam  Constitutional: She is oriented to person, place, and time. She  appears well-developed and well-nourished. No distress.  HENT:  Head: Normocephalic and atraumatic.  Right Ear: External ear normal.  Left Ear: External ear normal.  Nose: Nose normal.  Mouth/Throat: Oropharynx is clear and moist. No oropharyngeal exudate.  Eyes: Conjunctivae are normal. Pupils are equal, round, and reactive to light. Right eye exhibits no discharge. Left eye exhibits no  discharge. No scleral icterus.  Neck: Normal range of motion. Neck supple. No tracheal deviation present. No thyromegaly present.  Cardiovascular: Normal rate, regular rhythm, normal heart sounds and intact distal pulses.  Exam reveals no gallop and no friction rub.   No murmur heard. Pulmonary/Chest: Effort normal and breath sounds normal. No accessory muscle usage. Not tachypneic. No respiratory distress. She has no decreased breath sounds. She has no wheezes. She has no rhonchi. She has no rales. She exhibits no tenderness.  Musculoskeletal: Normal range of motion. She exhibits no edema and no tenderness.  Lymphadenopathy:    She has no cervical adenopathy.  Neurological: She is alert and oriented to person, place, and time. No cranial nerve deficit. She exhibits normal muscle tone. Coordination normal.  Skin: Skin is warm and dry. No rash noted. She is not diaphoretic. No erythema. No pallor.  Psychiatric: Her speech is normal and behavior is normal. Judgment and thought content normal. Cognition and memory are normal. She exhibits a depressed mood. She expresses no suicidal ideation.          Assessment & Plan:

## 2012-12-02 NOTE — Assessment & Plan Note (Signed)
Recent TSH elevated at 16. Question malabsorption of levothyroxine given history of gastric bypass. Will try increasing dose to levothyroxine to 224 mcg daily. If no improvement in TSH in 4 weeks, discussed referral to endocrinology.

## 2012-12-02 NOTE — Assessment & Plan Note (Signed)
Recent vitamin D level low on labs at 18. Will start Drisdol 50,000 units weekly x12 weeks, then repeat level.

## 2012-12-02 NOTE — Assessment & Plan Note (Signed)
Symptoms well controlled with Ambien. Will continue. 

## 2012-12-02 NOTE — Assessment & Plan Note (Signed)
Generalized fatigue likely multifactorial with hypothyroidism, morbid obesity, depression. Also suspect sleep apnea is playing a role. Discussed setting up sleep study if symptoms are not improving with adjustment of thyroid medication. Followup 4 weeks.

## 2012-12-02 NOTE — Assessment & Plan Note (Signed)
Symptoms recently worsening. Related to fatigue. Suspect hypothyroidism playing a role. Will see if any improvement with higher dose of Levothyroxine. If no improvement, would favor adding Abilify.

## 2012-12-02 NOTE — Assessment & Plan Note (Signed)
Wt Readings from Last 3 Encounters:  12/02/12 241 lb (109.317 kg)  06/09/12 235 lb 12 oz (106.935 kg)  06/02/12 233 lb (105.688 kg)   Encouraged effort at healthy diet and regular physician activity. Fatigue currently limiting pt physical activity level. Will see if any improvement with better control of thyroid function.

## 2013-01-08 ENCOUNTER — Ambulatory Visit (INDEPENDENT_AMBULATORY_CARE_PROVIDER_SITE_OTHER): Payer: No Typology Code available for payment source | Admitting: Internal Medicine

## 2013-01-08 ENCOUNTER — Encounter: Payer: Self-pay | Admitting: Emergency Medicine

## 2013-01-08 ENCOUNTER — Encounter: Payer: Self-pay | Admitting: Internal Medicine

## 2013-01-08 VITALS — BP 152/98 | HR 80 | Temp 97.4°F | Wt 247.0 lb

## 2013-01-08 DIAGNOSIS — F341 Dysthymic disorder: Secondary | ICD-10-CM

## 2013-01-08 DIAGNOSIS — F329 Major depressive disorder, single episode, unspecified: Secondary | ICD-10-CM

## 2013-01-08 DIAGNOSIS — E538 Deficiency of other specified B group vitamins: Secondary | ICD-10-CM

## 2013-01-08 DIAGNOSIS — H6690 Otitis media, unspecified, unspecified ear: Secondary | ICD-10-CM

## 2013-01-08 DIAGNOSIS — H6691 Otitis media, unspecified, right ear: Secondary | ICD-10-CM | POA: Insufficient documentation

## 2013-01-08 DIAGNOSIS — E039 Hypothyroidism, unspecified: Secondary | ICD-10-CM

## 2013-01-08 MED ORDER — SERTRALINE HCL 100 MG PO TABS: 100.0000 mg | ORAL_TABLET | Freq: Every day | ORAL | Status: DC

## 2013-01-08 MED ORDER — ALPRAZOLAM 1 MG PO TABS: ORAL_TABLET | ORAL | Status: DC

## 2013-01-08 MED ORDER — BUPROPION HCL ER (XL) 300 MG PO TB24: 300.0000 mg | ORAL_TABLET | Freq: Every day | ORAL | Status: DC

## 2013-01-08 MED ORDER — CYANOCOBALAMIN 1000 MCG/ML IJ SOLN
1000.0000 ug | Freq: Once | INTRAMUSCULAR | Status: AC
  Administered 2013-01-08: 1000 ug via INTRAMUSCULAR

## 2013-01-08 MED ORDER — AMOXICILLIN-POT CLAVULANATE 875-125 MG PO TABS: 1.0000 | ORAL_TABLET | Freq: Two times a day (BID) | ORAL | Status: DC

## 2013-01-08 MED ORDER — ARIPIPRAZOLE 2 MG PO TABS: 2.0000 mg | ORAL_TABLET | Freq: Every day | ORAL | Status: DC

## 2013-01-08 NOTE — Assessment & Plan Note (Signed)
Exam is consistent with right otitis media. Will treat with Augmentin. Patient will return to clinic for recheck of ears next week. She will call sooner if symptoms are not improving.

## 2013-01-08 NOTE — Progress Notes (Signed)
Subjective:    Patient ID: Paula Garrett, female    DOB: 02-Oct-1965, 47 y.o.   MRN: [id number]  HPI 47 year old female with history of obesity status post gastric bypass surgery, depression, anxiety, hypothyroidism presents for followup. At her last visit she was noted to have elevated TSH at 16. Her dose of levothyroxine was increased to 224 mcg daily. Repeat TSH was 1. Despite this, she reports continued symptoms of fatigue, weight gain, depression. She reports frequent tearfulness. She reports frustration that her situation will never improve. She is not currently undergoing counseling. She denies suicidal ideation.  She is also concerned today about several days of right ear pain. She denies any fever or chills. She does have occasional nasal congestion. She has not taken any medication for pain.  Outpatient Encounter Prescriptions as of 01/08/2013  Medication Sig Dispense Refill  . ALPRAZolam (XANAX) 1 MG tablet TAKE ONE TABLET BY MOUTH THREE TIMES DAILY AS NEEDED FOR ANXIETY AND FOR SLEEP  90 tablet  1  . buPROPion (WELLBUTRIN XL) 300 MG 24 hr tablet Take 1 tablet (300 mg total) by mouth daily.  30 tablet  6  . cetirizine (ZYRTEC) 10 MG tablet Take 10 mg by mouth daily.      . fluticasone (FLONASE) 50 MCG/ACT nasal spray Place 2 sprays into the nose daily.  16 g  0  . levothyroxine (SYNTHROID, LEVOTHROID) 112 MCG tablet Take 2 tablets (224 mcg total) by mouth daily.  60 tablet  3  . omeprazole (PRILOSEC) 40 MG capsule Take 1 capsule (40 mg total) by mouth daily.  30 capsule  11  . sertraline (ZOLOFT) 100 MG tablet Take 1 tablet (100 mg total) by mouth daily.  30 tablet  6  . zolpidem (AMBIEN) 10 MG tablet TAKE ONE TABLET BY MOUTH AT BEDTIME FOR SLEEP  30 tablet  3  . [DISCONTINUED] ALPRAZolam (XANAX) 1 MG tablet TAKE ONE TABLET BY MOUTH THREE TIMES DAILY AS NEEDED FOR ANXIETY AND FOR SLEEP  90 tablet  1  . [DISCONTINUED] buPROPion (WELLBUTRIN XL) 300 MG 24 hr tablet Take 1 tablet (300 mg  total) by mouth daily.  30 tablet  6  . [DISCONTINUED] sertraline (ZOLOFT) 100 MG tablet Take 1 tablet (100 mg total) by mouth daily.  30 tablet  6  . albuterol (PROVENTIL HFA;VENTOLIN HFA) 108 (90 BASE) MCG/ACT inhaler Inhale 2 puffs into the lungs every 6 (six) hours as needed for wheezing.  1 Inhaler  0  . amoxicillin-clavulanate (AUGMENTIN) 875-125 MG per tablet Take 1 tablet by mouth 2 (two) times daily.  20 tablet  0  . ARIPiprazole (ABILIFY) 2 MG tablet Take 1 tablet (2 mg total) by mouth daily.  30 tablet  3  . calcitRIOL (ROCALTROL) 0.5 MCG capsule Take 0.5 mcg by mouth once a week.      . Diphenhyd-Hydrocort-Nystatin (FIRST-DUKES MOUTHWASH) SUSP 10 cc's swish and spit tid  237 mL  0  . ergocalciferol (DRISDOL) 50000 UNITS capsule Take 1 capsule (50,000 Units total) by mouth once a week.  4 capsule  3  . folic acid (FOLVITE) 1 MG tablet Take 1 mg by mouth daily.      . Liniments (SALONPAS ARTHRITIS PAIN RELIEF) PADS Apply topically.      . metroNIDAZOLE (METROGEL) 0.75 % gel Apply 1 application topically 2 (two) times daily.      . vitamin B-12 (CYANOCOBALAMIN) 1000 MCG tablet Take 1,000 mcg by mouth daily.      . [EXPIRED] cyanocobalamin ((VITAMIN  B-12)) injection 1,000 mcg        No facility-administered encounter medications on file as of 01/08/2013.   BP 152/98  Pulse 80  Temp(Src) 97.4 F (36.3 C) (Oral)  Wt 247 lb (112.038 kg)  BMI 43.06 kg/m2  SpO2 93%  Review of Systems  Constitutional: Negative for fever, chills, appetite change, fatigue and unexpected weight change.  HENT: Negative for ear pain, congestion, sore throat, trouble swallowing, neck pain, voice change and sinus pressure.   Eyes: Negative for visual disturbance.  Respiratory: Negative for cough, shortness of breath, wheezing and stridor.   Cardiovascular: Negative for chest pain, palpitations and leg swelling.  Gastrointestinal: Negative for nausea, vomiting, abdominal pain, diarrhea, constipation, blood in  stool, abdominal distention and anal bleeding.  Genitourinary: Negative for dysuria and flank pain.  Musculoskeletal: Negative for myalgias, arthralgias and gait problem.  Skin: Negative for color change and rash.  Neurological: Negative for dizziness and headaches.  Hematological: Negative for adenopathy. Does not bruise/bleed easily.  Psychiatric/Behavioral: Positive for sleep disturbance and dysphoric mood. Negative for suicidal ideas. The patient is nervous/anxious.        Objective:   Physical Exam  Constitutional: She is oriented to person, place, and time. She appears well-developed and well-nourished. No distress.  HENT:  Head: Normocephalic and atraumatic.  Right Ear: External ear normal.  Left Ear: External ear normal.  Nose: Nose normal.  Mouth/Throat: Oropharynx is clear and moist. No oropharyngeal exudate.  Eyes: Conjunctivae are normal. Pupils are equal, round, and reactive to light. Right eye exhibits no discharge. Left eye exhibits no discharge. No scleral icterus.  Neck: Normal range of motion. Neck supple. No tracheal deviation present. No thyromegaly present.  Cardiovascular: Normal rate, regular rhythm, normal heart sounds and intact distal pulses.  Exam reveals no gallop and no friction rub.   No murmur heard. Pulmonary/Chest: Effort normal and breath sounds normal. No respiratory distress. She has no wheezes. She has no rales. She exhibits no tenderness.  Musculoskeletal: Normal range of motion. She exhibits no edema and no tenderness.  Lymphadenopathy:    She has no cervical adenopathy.  Neurological: She is alert and oriented to person, place, and time. No cranial nerve deficit. She exhibits normal muscle tone. Coordination normal.  Skin: Skin is warm and dry. No rash noted. She is not diaphoretic. No erythema. No pallor.  Psychiatric: Her speech is normal and behavior is normal. Judgment and thought content normal. Her mood appears anxious. Cognition and memory  are normal. She exhibits a depressed mood. She expresses no suicidal ideation. She expresses no suicidal plans.          Assessment & Plan:

## 2013-01-08 NOTE — Assessment & Plan Note (Signed)
Symptoms of anxiety and depression are very poorly controlled. No improvement with Wellbutrin and sertraline. Will add Abilify  2 mg daily. Will set up psychiatric evaluation. Patient will contract for safety. Patient will call if any worsening symptoms.

## 2013-01-08 NOTE — Assessment & Plan Note (Signed)
Repeat TSH was normal at 1. Will continue levothyroxine 224 mcg daily. However, given ongoing symptoms of fatigue, depression, and weight gain ever since diagnosis of hypothyroidism, will set up an endocrinology evaluation. Question if low-dose Cytomel might be helpful.

## 2013-01-14 ENCOUNTER — Telehealth: Payer: Self-pay | Admitting: Internal Medicine

## 2013-01-14 MED ORDER — FLUCONAZOLE 150 MG PO TABS: 150.0000 mg | ORAL_TABLET | Freq: Once | ORAL | Status: DC

## 2013-01-14 NOTE — Telephone Encounter (Signed)
Fine to call in Diflucan 150mg po x 1 

## 2013-01-14 NOTE — Telephone Encounter (Signed)
Patient needing something for a yeast infection after taking antibiotics that were prescribed by Dr. Dan Humphreys.

## 2013-01-14 NOTE — Telephone Encounter (Signed)
Patient informed Rx sent to pharmacy.

## 2013-01-14 NOTE — Telephone Encounter (Signed)
To Dr. Walker 

## 2013-01-20 ENCOUNTER — Ambulatory Visit (INDEPENDENT_AMBULATORY_CARE_PROVIDER_SITE_OTHER): Payer: No Typology Code available for payment source | Admitting: Internal Medicine

## 2013-01-20 ENCOUNTER — Encounter: Payer: Self-pay | Admitting: Internal Medicine

## 2013-01-20 VITALS — BP 170/100 | HR 79 | Temp 98.5°F | Wt 250.0 lb

## 2013-01-20 DIAGNOSIS — E538 Deficiency of other specified B group vitamins: Secondary | ICD-10-CM

## 2013-01-20 DIAGNOSIS — F341 Dysthymic disorder: Secondary | ICD-10-CM

## 2013-01-20 DIAGNOSIS — I10 Essential (primary) hypertension: Secondary | ICD-10-CM | POA: Insufficient documentation

## 2013-01-20 DIAGNOSIS — F329 Major depressive disorder, single episode, unspecified: Secondary | ICD-10-CM

## 2013-01-20 MED ORDER — LISINOPRIL 10 MG PO TABS: 10.0000 mg | ORAL_TABLET | Freq: Every day | ORAL | Status: DC

## 2013-01-20 MED ORDER — CYANOCOBALAMIN 1000 MCG/ML IJ SOLN
1000.0000 ug | Freq: Once | INTRAMUSCULAR | Status: AC
  Administered 2013-01-20: 1000 ug via INTRAMUSCULAR

## 2013-01-20 NOTE — Patient Instructions (Signed)
Start Lisinopril 10mg  daily.  Check labs next week (not fasting).  Follow up 4 weeks.

## 2013-01-20 NOTE — Assessment & Plan Note (Signed)
Symptoms are much improved with the addition of Abilify. Psychiatric evaluation is pending for next week. We'll continue to monitor. Followup 4 weeks.

## 2013-01-20 NOTE — Progress Notes (Signed)
Subjective:    Patient ID: Paula Garrett, female    DOB: [birth year], 47 y.o.   MRN: [id number]  HPI 47 year old female with h/o depression presents for follow up. At her last visit, we started Abilify to better help control symptoms of depression. She reports significant improvement in symptoms of depressed mood. She is more interested in her typical activities. She denies any side effects from the medication. She is scheduled to establish care with a local psychiatrist next week.  She reports that ear pain has improved with treatment with antibiotics. She denies any recurrent fever, chills, cough.  She notes that her blood pressure has been running high, typically greater than 140/90. She denies any chest pain, palpitations, headache. She was on blood pressure medication the past prior to gastric bypass surgery. She is unsure which medication she took.  Outpatient Encounter Prescriptions as of 01/20/2013  Medication Sig Dispense Refill  . ALPRAZolam (XANAX) 1 MG tablet TAKE ONE TABLET BY MOUTH THREE TIMES DAILY AS NEEDED FOR ANXIETY AND FOR SLEEP  90 tablet  1  . ARIPiprazole (ABILIFY) 2 MG tablet Take 1 tablet (2 mg total) by mouth daily.  30 tablet  3  . buPROPion (WELLBUTRIN XL) 300 MG 24 hr tablet Take 1 tablet (300 mg total) by mouth daily.  30 tablet  6  . cetirizine (ZYRTEC) 10 MG tablet Take 10 mg by mouth daily.      . fluticasone (FLONASE) 50 MCG/ACT nasal spray Place 2 sprays into the nose daily.  16 g  0  . levothyroxine (SYNTHROID, LEVOTHROID) 112 MCG tablet Take 2 tablets (224 mcg total) by mouth daily.  60 tablet  3  . omeprazole (PRILOSEC) 40 MG capsule Take 1 capsule (40 mg total) by mouth daily.  30 capsule  11  . sertraline (ZOLOFT) 100 MG tablet Take 1 tablet (100 mg total) by mouth daily.  30 tablet  6  . zolpidem (AMBIEN) 10 MG tablet TAKE ONE TABLET BY MOUTH AT BEDTIME FOR SLEEP  30 tablet  3  . [DISCONTINUED] amoxicillin-clavulanate (AUGMENTIN) 875-125 MG per tablet Take 1  tablet by mouth 2 (two) times daily.  20 tablet  0  . albuterol (PROVENTIL HFA;VENTOLIN HFA) 108 (90 BASE) MCG/ACT inhaler Inhale 2 puffs into the lungs every 6 (six) hours as needed for wheezing.  1 Inhaler  0  . lisinopril (PRINIVIL,ZESTRIL) 10 MG tablet Take 1 tablet (10 mg total) by mouth daily.  90 tablet  3  . metroNIDAZOLE (METROGEL) 0.75 % gel Apply 1 application topically 2 (two) times daily.      . vitamin B-12 (CYANOCOBALAMIN) 1000 MCG tablet Take 1,000 mcg by mouth daily.       No facility-administered encounter medications on file as of 01/20/2013.   BP 170/100  Pulse 79  Temp(Src) 98.5 F (36.9 C) (Oral)  Wt 250 lb (113.399 kg)  BMI 43.59 kg/m2  SpO2 97%  Review of Systems  Constitutional: Negative for fever, chills, appetite change, fatigue and unexpected weight change.  HENT: Negative for ear pain, congestion, sore throat, trouble swallowing, neck pain, voice change and sinus pressure.   Eyes: Negative for visual disturbance.  Respiratory: Negative for cough, shortness of breath, wheezing and stridor.   Cardiovascular: Negative for chest pain, palpitations and leg swelling.  Gastrointestinal: Negative for nausea, vomiting, abdominal pain, diarrhea, constipation, blood in stool, abdominal distention and anal bleeding.  Genitourinary: Negative for dysuria and flank pain.  Musculoskeletal: Negative for myalgias, arthralgias and gait problem.  Skin:  Negative for color change and rash.  Neurological: Negative for dizziness and headaches.  Hematological: Negative for adenopathy. Does not bruise/bleed easily.  Psychiatric/Behavioral: Negative for suicidal ideas, sleep disturbance and dysphoric mood. The patient is not nervous/anxious.        Objective:   Physical Exam  Constitutional: She is oriented to person, place, and time. She appears well-developed and well-nourished. No distress.  HENT:  Head: Normocephalic and atraumatic.  Right Ear: External ear normal.  Left  Ear: External ear normal.  Nose: Nose normal.  Mouth/Throat: Oropharynx is clear and moist. No oropharyngeal exudate.  Eyes: Conjunctivae are normal. Pupils are equal, round, and reactive to light. Right eye exhibits no discharge. Left eye exhibits no discharge. No scleral icterus.  Neck: Normal range of motion. Neck supple. No tracheal deviation present. No thyromegaly present.  Cardiovascular: Normal rate, regular rhythm, normal heart sounds and intact distal pulses.  Exam reveals no gallop and no friction rub.   No murmur heard. Pulmonary/Chest: Effort normal and breath sounds normal. No accessory muscle usage. Not tachypneic. No respiratory distress. She has no decreased breath sounds. She has no wheezes. She has no rhonchi. She has no rales. She exhibits no tenderness.  Musculoskeletal: Normal range of motion. She exhibits no edema and no tenderness.  Lymphadenopathy:    She has no cervical adenopathy.  Neurological: She is alert and oriented to person, place, and time. No cranial nerve deficit. She exhibits normal muscle tone. Coordination normal.  Skin: Skin is warm and dry. No rash noted. She is not diaphoretic. No erythema. No pallor.  Psychiatric: She has a normal mood and affect. Her behavior is normal. Judgment and thought content normal.          Assessment & Plan:

## 2013-01-20 NOTE — Addendum Note (Signed)
Addended by: Chandra Batch E on: 01/20/2013 01:06 PM   Modules accepted: Orders

## 2013-01-20 NOTE — Assessment & Plan Note (Signed)
BP Readings from Last 3 Encounters:  01/20/13 170/100  01/08/13 152/98  12/02/12 126/84   Blood pressure recently elevated. Will restart lisinopril 10 mg daily. Will check creatinine and potassium next week. Followup in 4 weeks or sooner as needed.

## 2013-01-28 ENCOUNTER — Ambulatory Visit (INDEPENDENT_AMBULATORY_CARE_PROVIDER_SITE_OTHER): Payer: No Typology Code available for payment source | Admitting: *Deleted

## 2013-01-28 DIAGNOSIS — E538 Deficiency of other specified B group vitamins: Secondary | ICD-10-CM

## 2013-01-28 MED ORDER — CYANOCOBALAMIN 1000 MCG/ML IJ SOLN
1000.0000 ug | Freq: Once | INTRAMUSCULAR | Status: AC
  Administered 2013-01-28: 1000 ug via INTRAMUSCULAR

## 2013-02-02 ENCOUNTER — Other Ambulatory Visit: Payer: Self-pay | Admitting: *Deleted

## 2013-02-02 DIAGNOSIS — F329 Major depressive disorder, single episode, unspecified: Secondary | ICD-10-CM

## 2013-02-02 MED ORDER — ALPRAZOLAM 1 MG PO TABS: ORAL_TABLET | ORAL | Status: DC

## 2013-02-23 ENCOUNTER — Encounter: Payer: Self-pay | Admitting: *Deleted

## 2013-02-24 ENCOUNTER — Ambulatory Visit (INDEPENDENT_AMBULATORY_CARE_PROVIDER_SITE_OTHER): Payer: No Typology Code available for payment source | Admitting: Internal Medicine

## 2013-02-24 ENCOUNTER — Encounter: Payer: Self-pay | Admitting: Internal Medicine

## 2013-02-24 VITALS — BP 110/70 | HR 82 | Temp 97.9°F | Wt 252.0 lb

## 2013-02-24 DIAGNOSIS — I10 Essential (primary) hypertension: Secondary | ICD-10-CM

## 2013-02-24 DIAGNOSIS — F329 Major depressive disorder, single episode, unspecified: Secondary | ICD-10-CM

## 2013-02-24 DIAGNOSIS — E538 Deficiency of other specified B group vitamins: Secondary | ICD-10-CM

## 2013-02-24 DIAGNOSIS — G4700 Insomnia, unspecified: Secondary | ICD-10-CM

## 2013-02-24 DIAGNOSIS — F341 Dysthymic disorder: Secondary | ICD-10-CM

## 2013-02-24 DIAGNOSIS — E039 Hypothyroidism, unspecified: Secondary | ICD-10-CM

## 2013-02-24 DIAGNOSIS — Z23 Encounter for immunization: Secondary | ICD-10-CM

## 2013-02-24 MED ORDER — CYANOCOBALAMIN 1000 MCG/ML IJ SOLN
1000.0000 ug | Freq: Once | INTRAMUSCULAR | Status: AC
  Administered 2013-02-24: 1000 ug via INTRAMUSCULAR

## 2013-02-24 MED ORDER — CLONAZEPAM 0.5 MG PO TABS: 0.5000 mg | ORAL_TABLET | Freq: Three times a day (TID) | ORAL | Status: DC | PRN

## 2013-02-24 MED ORDER — SERTRALINE HCL 100 MG PO TABS: 150.0000 mg | ORAL_TABLET | Freq: Every day | ORAL | Status: DC

## 2013-02-24 NOTE — Progress Notes (Signed)
Subjective:    Patient ID: Paula Garrett, female    DOB: 28-Jan-1966, 47 y.o.   MRN: [id number]  HPI 47YO female with anxiety, depression, hypothyroidism, obesity, and hypertension presents for follow up.  HTN - Tolerating Lisinopril well. No side effects noted. Reports BP better controled at home, but did not bring record of BP today.  Anxiety/Depression - Recently seen by psychiatrist. Sertraline increased to 150mg  daily. Abilify continued. Wellbutrin stopped. Alprazolam stopped and Clonazepam started. Pt reports plan to taper off Clonazepam this month. Pt is tearful describing this, very anxious about stopping medication. Has follow up with psychiatrist this week.  Insomnia - Psychiatrist recently changed from Ambien to Trazodone. Notes some increased daytime drowsiness with this, however sleeping well at night.  Hypothyroidism - Continues to have fatigue, weight gain. Has scheduled evaluation with endocrinologist this month. Compliant with levothyroxine.  Outpatient Encounter Prescriptions as of 02/24/2013  Medication Sig Dispense Refill  . ARIPiprazole (ABILIFY) 2 MG tablet Take 1 tablet (2 mg total) by mouth daily.  30 tablet  3  . cetirizine (ZYRTEC) 10 MG tablet Take 10 mg by mouth daily.      . fluticasone (FLONASE) 50 MCG/ACT nasal spray Place 2 sprays into the nose daily.  16 g  0  . levothyroxine (SYNTHROID, LEVOTHROID) 112 MCG tablet Take 2 tablets (224 mcg total) by mouth daily.  60 tablet  3  . lisinopril (PRINIVIL,ZESTRIL) 10 MG tablet Take 1 tablet (10 mg total) by mouth daily.  90 tablet  3  . omeprazole (PRILOSEC) 40 MG capsule Take 1 capsule (40 mg total) by mouth daily.  30 capsule  11  . sertraline (ZOLOFT) 100 MG tablet Take 1.5 tablets (150 mg total) by mouth daily. Take 1.5 tablets daily  45 tablet  0  . traZODone (DESYREL) 100 MG tablet Take 100 mg by mouth at bedtime.      Marland Kitchen albuterol (PROVENTIL HFA;VENTOLIN HFA) 108 (90 BASE) MCG/ACT inhaler Inhale 2 puffs into the  lungs every 6 (six) hours as needed for wheezing.  1 Inhaler  0  . clonazePAM (KLONOPIN) 0.5 MG tablet Take 1 tablet (0.5 mg total) by mouth 3 (three) times daily as needed for anxiety.  20 tablet  1  . metroNIDAZOLE (METROGEL) 0.75 % gel Apply 1 application topically 2 (two) times daily.      . vitamin B-12 (CYANOCOBALAMIN) 1000 MCG tablet Take 1,000 mcg by mouth daily.       No facility-administered encounter medications on file as of 02/24/2013.   BP 110/70  Pulse 82  Temp(Src) 97.9 F (36.6 C) (Oral)  Wt 252 lb (114.306 kg)  BMI 43.93 kg/m2  SpO2 96%  Review of Systems  Constitutional: Positive for fatigue. Negative for fever, chills, appetite change and unexpected weight change.  HENT: Negative for ear pain, congestion, sore throat, trouble swallowing, neck pain, voice change and sinus pressure.   Eyes: Negative for visual disturbance.  Respiratory: Negative for cough, shortness of breath, wheezing and stridor.   Cardiovascular: Negative for chest pain, palpitations and leg swelling.  Gastrointestinal: Negative for nausea, vomiting, abdominal pain, diarrhea, constipation, blood in stool, abdominal distention and anal bleeding.  Genitourinary: Negative for dysuria and flank pain.  Musculoskeletal: Negative for myalgias, arthralgias and gait problem.  Skin: Negative for color change and rash.  Neurological: Negative for dizziness and headaches.  Hematological: Negative for adenopathy. Does not bruise/bleed easily.  Psychiatric/Behavioral: Positive for sleep disturbance and dysphoric mood. Negative for suicidal ideas. The patient is nervous/anxious.  Objective:   Physical Exam  Constitutional: She is oriented to person, place, and time. She appears well-developed and well-nourished. No distress.  HENT:  Head: Normocephalic and atraumatic.  Right Ear: External ear normal.  Left Ear: External ear normal.  Nose: Nose normal.  Mouth/Throat: Oropharynx is clear and moist. No  oropharyngeal exudate.  Eyes: Conjunctivae are normal. Pupils are equal, round, and reactive to light. Right eye exhibits no discharge. Left eye exhibits no discharge. No scleral icterus.  Neck: Normal range of motion. Neck supple. No tracheal deviation present. No thyromegaly present.  Cardiovascular: Normal rate, regular rhythm, normal heart sounds and intact distal pulses.  Exam reveals no gallop and no friction rub.   No murmur heard. Pulmonary/Chest: Effort normal and breath sounds normal. No accessory muscle usage. Not tachypneic. No respiratory distress. She has no decreased breath sounds. She has no wheezes. She has no rhonchi. She has no rales. She exhibits no tenderness.  Musculoskeletal: Normal range of motion. She exhibits no edema and no tenderness.  Lymphadenopathy:    She has no cervical adenopathy.  Neurological: She is alert and oriented to person, place, and time. No cranial nerve deficit. She exhibits normal muscle tone. Coordination normal.  Skin: Skin is warm and dry. No rash noted. She is not diaphoretic. No erythema. No pallor.  Psychiatric: Her speech is normal and behavior is normal. Judgment and thought content normal. Her mood appears anxious. Cognition and memory are normal. She exhibits a depressed mood. She expresses no suicidal ideation.          Assessment & Plan:

## 2013-02-24 NOTE — Addendum Note (Signed)
Addended by: Theola Sequin on: 02/24/2013 02:32 PM   Modules accepted: Orders

## 2013-02-24 NOTE — Assessment & Plan Note (Signed)
Endocrinology evaluation is pending. Will continue Levothyroxine daily. As previous note, question if low dose Cytomel or change to [place] thyroid might be helpful given persistent symptoms of fatigue, depression, weight gain.

## 2013-02-24 NOTE — Assessment & Plan Note (Signed)
Symptoms improved on current medications, however pt extremely anxious about any further tapering of Clonazepam. Encourage her to discuss this with her psychiatrist this week.

## 2013-02-24 NOTE — Assessment & Plan Note (Signed)
Recently changed form Ambien to Trazodone by her psychiatrist. Symptomatically doing well. Will continue Trazodone.

## 2013-02-24 NOTE — Assessment & Plan Note (Signed)
BP Readings from Last 3 Encounters:  02/24/13 110/70  01/20/13 170/100  01/08/13 152/98   BP well controlled on Lisinopril. Will continue.

## 2013-03-24 ENCOUNTER — Ambulatory Visit (INDEPENDENT_AMBULATORY_CARE_PROVIDER_SITE_OTHER): Payer: No Typology Code available for payment source | Admitting: Internal Medicine

## 2013-03-24 ENCOUNTER — Encounter: Payer: Self-pay | Admitting: Internal Medicine

## 2013-03-24 VITALS — BP 124/76 | HR 80 | Temp 97.8°F | Resp 12 | Ht 63.0 in | Wt 256.0 lb

## 2013-03-24 DIAGNOSIS — E890 Postprocedural hypothyroidism: Secondary | ICD-10-CM

## 2013-03-24 DIAGNOSIS — E039 Hypothyroidism, unspecified: Secondary | ICD-10-CM

## 2013-03-24 NOTE — Patient Instructions (Signed)
Please return in 3 months but we will stay in touch regarding your thyroid labs and further plan. Start Ergocalciferol 50,000 units once a week, as prescribed by Dr Dan Humphreys. Start a multivitamin a day. Start a B complex daily. Try the phone app My fitness pal or the website [email or web address].org for help with weight loss.

## 2013-03-24 NOTE — Progress Notes (Signed)
Patient ID: Paula Garrett, female   DOB: [birth year], 47 y.o.   MRN: [id number]   HPI  Paula Garrett is a 47 y.o.-year-old female, referred by her PCP, Dr. Dan Humphreys, for evaluation for hypothyroidism. She is here with her best friend who offers part of the history and offers support.   Pt. has been dx with hyperthyroidism in 2005, and she had hyperthyroidism and MNG - she had surgery as she opted for it against RAI tx. No clear dx of Graves ds.; is on Synthroid (brand name; tried generic >> did not work) 224 mcg, taken: - fasting - with water - separated by >30 min from b'fast  - no calcium,  Iron (was on IV iron as her po iron was irritating her stomach) - in pm: takes PPIs - no multivitamins    Pt denies feeling nodules in neck, hoarseness, dysphagia/odynophagia, SOB with lying down; she c/o: - weight gain: 35 lbs in last 6 mo - + fatigue: not getting out of bed all day in her days off. Works 5:30 am - 5:30 pm - no constipation; some diarrhea - + dry skin; dry lips  - no hair falling - no problems with concentration - + depression, + anxiety  She has a negative FH of thyroid disorders. No FH of thyroid cancer. No h/o radiation tx to head or neck.  I reviewed her chart and she also has a history of GBP - not compliant with vitamin supplements, vit D def. - did not start the Ergocalciferol Rx'd by Dr Dan Humphreys 2 mo ago, when vit D was 18, anxiety/depression  - Dr Edwin Dada, HTN, GERD  Had a prednisone taper 2 weeks ago for a respiratory inf.   Reviewed the following labs; - 10/30/2011: TSH 0.006, TT4 12 (4.5-12) - 01/16/2012: TSH 0.021 - 11/27/2012: TSH 16.2   Vit D 18.2 - 01/04/2013: TSH 1.060, TT4 7.9  ROS: Constitutional: + weight gain, + fatigue, no subjective hyperthermia/hypothermia, + poor sleep, + nocturia Eyes:+ blurry vision, no xerophthalmia ENT: no sore throat, + nodules palpated in throat, no dysphagia/odynophagia, no hoarseness Cardiovascular: no CP/+SOB/no palpitations/leg  swelling Respiratory: no cough/+ SOB/+ wheezing Gastrointestinal: no N/V/+ some D/no C, + GERD Musculoskeletal: + both  muscle/joint aches Skin: no rashes, + easy bruising + hair loss Neurological: no tremors/numbness/tingling/dizziness, + HA Psychiatric: no depression/anxiety Low libido  Past Medical History  Diagnosis Date  . Depression   . Arthritis   . GERD (gastroesophageal reflux disease)   . Thyroid disease    Past Surgical History  Procedure Laterality Date  . Gastric bypass  2005  . Total thyroidectomy    . Tubal ligation    . Breast surgery      cyst aspiration  . Nasal sinus surgery    . Breast surgery  1998    cyst aspiration  . Ablation  2006   History   Social History  . 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 Smoking status: Current Every Day Smoker  . Smokeless tobacco: Not on file  . Alcohol Use: 1.0 oz/week    2 drink(s) per week  . Drug Use: No  . Sexual Activity: Yes    Partners: Male   Social History Narrative   Lives in [place] with husband.      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 albuterol (PROVENTIL HFA;VENTOLIN HFA) 108 (90 BASE) MCG/ACT inhaler Inhale 2 puffs into the lungs every 6 (six) hours as needed for wheezing.  1 Inhaler  0  . cetirizine (ZYRTEC) 10 MG tablet Take 10 mg by mouth daily.      . clonazePAM (KLONOPIN) 0.5 MG tablet Take 1 tablet (0.5 mg total) by mouth 3 (three) times daily as needed for anxiety.  20 tablet  1  . fluticasone (FLONASE) 50 MCG/ACT nasal spray Place 2 sprays into the nose daily.  16 g  0  . levothyroxine (SYNTHROID, LEVOTHROID) 112 MCG tablet Take 2 tablets (224 mcg total) by mouth daily.  60 tablet  3  . lisinopril (PRINIVIL,ZESTRIL) 10 MG tablet Take  1 tablet (10 mg total) by mouth daily.  90 tablet  3  . omeprazole (PRILOSEC) 40 MG capsule Take 1 capsule (40 mg total) by mouth daily.  30 capsule  11  . traZODone (DESYREL) 100 MG tablet Take 100 mg by mouth at bedtime.       No current facility-administered medications on file prior to visit.   No Known Allergies  Family History  Problem Relation Age of Onset  . Heart disease Mother   . COPD Mother   . Arthritis Mother   . Cancer Mother     uterine  . COPD Father   . Arthritis Father   . Lupus Father   . Diabetes Maternal Grandmother   . Diabetes Maternal Grandfather   . Diabetes Paternal Grandmother   . Diabetes Paternal Grandfather    PE: BP 124/76  Pulse 80  Temp(Src) 97.8 F (36.6 C) (Oral)  Resp 12  Ht 5\' 3"  (1.6 m)  Wt 256 lb (116.121 kg)  BMI 45.36 kg/m2  SpO2 96% Wt Readings from Last 3 Encounters:  03/24/13 256 lb (116.121 kg)  02/24/13 252 lb (114.306 kg)  01/20/13 250 lb (113.399 kg)   Constitutional: obese, truncal obesity, in NAD but tearful Eyes: PERRLA, EOMI, no exophthalmos ENT: moist mucous membranes, no thyromegaly, no cervical lymphadenopathy. Large neck. Cardiovascular: RRR, No MRG Respiratory: CTA B Gastrointestinal: abdomen soft, NT, ND, BS+ Musculoskeletal: no deformities, strength intact in all 4 Skin: moist, warm, no rashes; angular cheilitis Neurological: slight tremor with outstretched hands, DTR normal in all 4  ASSESSMENT: 1. Hypothyroidism  PLAN:  1. Patient with long-standing hypothyroidism, on levothyroxine therapy. She feels fatigued and continues to gain weight. She also has anxiety and depression and is tearful throughout the visit today.  - We discussed about correct intake of levothyroxine, fasting, with water, separated by at least 30 minutes from breakfast, and separated by more than 4 hours from calcium, iron, multivitamins, acid reflux medications (PPIs). - last set of TFTs were normal, with TSH of 1.06 3 mo ago,  previously, 16, but this was after a period of noncompliance. I reinforced the need for absolute compliance with the Synthroid. From review of her previous test results and from her report, compliance does appear to be an issue for her. - will check thyroid tests today: TSH, free T4 - If these are abnormal, she will need to return in 6-8 weeks for repeat labs - If these are normal, I will see her back in 3 months - we discussed about the possible addition of T3 as suggested by PCP (this was not brought  about by the pt today) , but I explained that I do not think this is a good option for her as it is short-acting and can cause more emotional lability that pt already has, worsening both her anxiety and her depression.  - to help with her fatigue, I strongly suggested that she becomes compliant with her vitamins (did not start Ergocalciferol after her vitamin D was found low, at 18, and also has angular cheilitis, a sign of B vit deficiency). Advised to start a MVI a day - separated from Synthroid, and also a B complex a day. She agrees with this. She will need vitamin level recheck ~2 mo after starting the above.  - given suggestions for app and website for weight loss help (see pt instructions)  Received lab from [facility]: TSH 16.220. Did not receive free T4.   Discussed with the patient and she confirmed that she is taking the dose every day. In this case, we will need an increase in dose. I also advised her to return in 6 weeks for labs.

## 2013-03-26 ENCOUNTER — Telehealth: Payer: Self-pay | Admitting: *Deleted

## 2013-03-26 NOTE — Telephone Encounter (Signed)
Called pt and advised her that her TSH is 16.6. Please be sure that you take your Synthroid EVERY DAY, then we need to increase her dose. Advised her to not miss ANY doses. Pt understood.

## 2013-04-01 ENCOUNTER — Encounter: Payer: Self-pay | Admitting: Internal Medicine

## 2013-04-01 MED ORDER — LEVOTHYROXINE SODIUM 112 MCG PO TABS: ORAL_TABLET | ORAL | Status: DC

## 2013-04-01 MED ORDER — SYNTHROID 125 MCG PO TABS: ORAL_TABLET | ORAL | Status: DC

## 2013-04-05 ENCOUNTER — Encounter: Payer: Self-pay | Admitting: Internal Medicine

## 2013-04-24 ENCOUNTER — Other Ambulatory Visit: Payer: Self-pay | Admitting: Internal Medicine

## 2013-05-17 ENCOUNTER — Telehealth: Payer: Self-pay | Admitting: *Deleted

## 2013-05-17 NOTE — Telephone Encounter (Signed)
Called pt and lvm advising her that her labs are all normal. Gave her the values. Advised if she had any questions or concerns to call our office.

## 2013-05-28 ENCOUNTER — Ambulatory Visit (INDEPENDENT_AMBULATORY_CARE_PROVIDER_SITE_OTHER): Payer: No Typology Code available for payment source | Admitting: Internal Medicine

## 2013-05-28 VITALS — BP 126/72 | HR 77 | Wt 266.0 lb

## 2013-05-28 DIAGNOSIS — E559 Vitamin D deficiency, unspecified: Secondary | ICD-10-CM

## 2013-05-28 DIAGNOSIS — E039 Hypothyroidism, unspecified: Secondary | ICD-10-CM

## 2013-05-28 DIAGNOSIS — E669 Obesity, unspecified: Secondary | ICD-10-CM

## 2013-05-28 MED ORDER — PHENTERMINE HCL 37.5 MG PO CAPS: 37.5000 mg | ORAL_CAPSULE | ORAL | Status: DC

## 2013-05-28 NOTE — Progress Notes (Signed)
Pre visit review using our clinic review tool, if applicable. No additional management support is needed unless otherwise documented below in the visit note. 

## 2013-05-29 ENCOUNTER — Encounter: Payer: Self-pay | Admitting: Internal Medicine

## 2013-05-29 NOTE — Progress Notes (Signed)
Subjective:    Patient ID: Paula Garrett, female    DOB: 1966/05/31, 47 y.o.   MRN: [id number]  HPI 47 year old female with history of anxiety, obesity status post gastric bypass, hypertension, and hypothyroidism presents for followup. Her primary concern today is weight gain. She has gained almost 20 pounds in the last 3 months. She reports increased craving for carbohydrates. She is waking up during the night daily. Recently, her thyroid function was found to be low and she had dose of levothyroxine increased. Recheck of thyroid function she reports was normal. She is planning to start an exercise program with her friend. She would like to start medication to help with appetite suppression.  Outpatient Encounter Prescriptions as of 05/28/2013  Medication Sig  . albuterol (PROVENTIL HFA;VENTOLIN HFA) 108 (90 BASE) MCG/ACT inhaler Inhale 2 puffs into the lungs every 6 (six) hours as needed for wheezing.  . ARIPiprazole (ABILIFY) 5 MG tablet Take 5 mg by mouth daily.  . cetirizine (ZYRTEC) 10 MG tablet Take 10 mg by mouth daily.  . clonazePAM (KLONOPIN) 0.5 MG tablet Take 1 tablet (0.5 mg total) by mouth 3 (three) times daily as needed for anxiety.  . CYANOCOBALAMIN IJ Inject 1,000 mg as directed every 30 (thirty) days.  . fluticasone (FLONASE) 50 MCG/ACT nasal spray Place 2 sprays into the nose daily.  Marland Kitchen levothyroxine (SYNTHROID, LEVOTHROID) 112 MCG tablet Take by mouth 1 tab of 112 mcg and 1 tab of 125 mcg daily.  Marland Kitchen lisinopril (PRINIVIL,ZESTRIL) 10 MG tablet Take 1 tablet (10 mg total) by mouth daily.  Marland Kitchen omeprazole (PRILOSEC) 40 MG capsule Take 1 capsule (40 mg total) by mouth daily.  . sertraline (ZOLOFT) 100 MG tablet Take 200 mg by mouth daily. Take 1.5 tablets daily  . traZODone (DESYREL) 100 MG tablet Take 100 mg by mouth at bedtime.  . Vitamin D, Ergocalciferol, (DRISDOL) 50000 UNITS CAPS capsule Take 50,000 Units by mouth every 7 (seven) days.   BP 126/72  Pulse 77  Wt 266 lb  (120.657 kg)  SpO2 96%  Review of Systems  Constitutional: Positive for fatigue. Negative for fever, chills, appetite change and unexpected weight change.  HENT: Negative for congestion, ear pain, sinus pressure, sore throat, trouble swallowing and voice change.   Eyes: Negative for visual disturbance.  Respiratory: Negative for cough, shortness of breath, wheezing and stridor.   Cardiovascular: Negative for chest pain, palpitations and leg swelling.  Gastrointestinal: Negative for nausea, vomiting, abdominal pain, diarrhea, constipation, blood in stool, abdominal distention and anal bleeding.  Genitourinary: Negative for dysuria and flank pain.  Musculoskeletal: Negative for arthralgias, gait problem, myalgias and neck pain.  Skin: Negative for color change and rash.  Neurological: Negative for dizziness and headaches.  Hematological: Negative for adenopathy. Does not bruise/bleed easily.  Psychiatric/Behavioral: Positive for dysphoric mood. Negative for suicidal ideas and sleep disturbance. The patient is nervous/anxious.        Objective:   Physical Exam  Constitutional: She is oriented to person, place, and time. She appears well-developed and well-nourished. No distress.  HENT:  Head: Normocephalic and atraumatic.  Right Ear: External ear normal.  Left Ear: External ear normal.  Nose: Nose normal.  Mouth/Throat: Oropharynx is clear and moist. No oropharyngeal exudate.  Eyes: Conjunctivae are normal. Pupils are equal, round, and reactive to light. Right eye exhibits no discharge. Left eye exhibits no discharge. No scleral icterus.  Neck: Normal range of motion. Neck supple. No tracheal deviation present. No thyromegaly present.  Cardiovascular: Normal  rate, regular rhythm, normal heart sounds and intact distal pulses.  Exam reveals no gallop and no friction rub.   No murmur heard. Pulmonary/Chest: Effort normal and breath sounds normal. No accessory muscle usage. Not tachypneic.  No respiratory distress. She has no decreased breath sounds. She has no wheezes. She has no rhonchi. She has no rales. She exhibits no tenderness.  Musculoskeletal: Normal range of motion. She exhibits no edema and no tenderness.  Lymphadenopathy:    She has no cervical adenopathy.  Neurological: She is alert and oriented to person, place, and time. No cranial nerve deficit. She exhibits normal muscle tone. Coordination normal.  Skin: Skin is warm and dry. No rash noted. She is not diaphoretic. No erythema. No pallor.  Psychiatric: She has a normal mood and affect. Her behavior is normal. Judgment and thought content normal.          Assessment & Plan:

## 2013-05-29 NOTE — Assessment & Plan Note (Signed)
Recent thyroid function improved per pt report of outside labs. Continue current dose of Synthroid. Follow up with endocrinology as scheduled.

## 2013-05-29 NOTE — Assessment & Plan Note (Signed)
Previously low Vit D level. Will check level with labs before continuing high dose supplementation.

## 2013-05-29 NOTE — Assessment & Plan Note (Signed)
Wt Readings from Last 3 Encounters:  05/28/13 266 lb (120.657 kg)  03/24/13 256 lb (116.121 kg)  02/24/13 252 lb (114.306 kg)   Significant increase in weight, with 14lb weight gain over last 3 months. Complicated by hypothyroidism and anxiety with increased cravings for carbohydrates. Encouraged healthy diet and regular exercise. Will start phentermine to help with appetite suppression. She has taken this medication in the past and tolerated well. We discussed potential side effects of the medication. She will call if any problems. Follow up 4 weeks.

## 2013-06-17 DIAGNOSIS — K2270 Barrett's esophagus without dysplasia: Secondary | ICD-10-CM

## 2013-06-17 HISTORY — DX: Barrett's esophagus without dysplasia: K22.70

## 2013-06-30 ENCOUNTER — Encounter: Payer: Self-pay | Admitting: Internal Medicine

## 2013-06-30 ENCOUNTER — Ambulatory Visit (INDEPENDENT_AMBULATORY_CARE_PROVIDER_SITE_OTHER): Payer: No Typology Code available for payment source | Admitting: Internal Medicine

## 2013-06-30 VITALS — BP 118/84 | HR 74 | Temp 98.1°F | Ht 63.0 in | Wt 265.5 lb

## 2013-06-30 DIAGNOSIS — E890 Postprocedural hypothyroidism: Secondary | ICD-10-CM

## 2013-06-30 DIAGNOSIS — E039 Hypothyroidism, unspecified: Secondary | ICD-10-CM

## 2013-06-30 LAB — TSH: TSH: 9.82 u[IU]/mL — AB (ref 0.35–5.50)

## 2013-06-30 LAB — T4, FREE: Free T4: 1.02 ng/dL (ref 0.60–1.60)

## 2013-06-30 NOTE — Progress Notes (Signed)
Patient ID: Paula Garrett, female   DOB: 19-Sep-1965, 48 y.o.   MRN: [id number]   HPI  Paula Garrett is a 48 y.o.-year-old female, returning for f/u for hypothyroidism. Last visit 3 mo ago.  Reviewed hx: Pt. 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 for it against RAI tx) >> developed iatrogenic hypothyroidism >> tried generic Levothyroxine >> "did not work" >> Brand name Synthroid 224 mcg (probably malabsorption 2/2 GBP), taken: - fasting - with water - separated by >30 min from b'fast  - no calcium,  Iron (was on IV iron as her po iron was irritating her stomach) - + PPIs in pm - + multivitamins at night  Reviewed the following labs: - 05/06/2013: TSH 3.44, fT4 1.34 - 03/24/2013: TSH 16.22 - 01/04/2013: TSH 1.060, TT4 7.9  - 11/27/2012: TSH 16.2   Vit D 18.2 - 01/16/2012: TSH 0.021 - 10/30/2011: TSH 0.006, TT4 12 (4.5-12)  Pt denies feeling nodules in neck, hoarseness, dysphagia/odynophagia, SOB with lying down.  She c/o: - weight gain: 9 lbs since last visit - gained all her pre-GBP weight + 20 lbs. Started Phentermine 1 mo ago w/o results.  - + fatigue - no constipation; some diarrhea - + dry skin - no hair falling - no problems with concentration - + depression, + anxiety  She also has a history of GBP, vit D def., anxiety/depression  - Dr Jake Michaelis, HTN, GERD  No recent prednisone tapers.  I reviewed pt's medications, allergies, PMH, social hx, family hx and no changes required, except as mentioned above. She is tapering down Abilify.   ROS: Constitutional: + weight gain, + fatigue, + increased appetite, no subjective hyperthermia/hypothermia Eyes:no blurry vision, no xerophthalmia ENT: no sore throat, no nodules palpated in throat, no dysphagia/odynophagia, no hoarseness Cardiovascular: no CP/+SOB/no palpitations/leg swelling Respiratory: no cough/+ SOB/no wheezing Gastrointestinal: no N/V/D/C, +  GERD Musculoskeletal: no  Muscle/+ joint aches Skin: no rashes Neurological: no tremors/numbness/tingling/dizziness Low libido  PE: BP 118/84  Pulse 74  Temp(Src) 98.1 F (36.7 C) (Oral)  Ht 5\' 3"  (1.6 m)  Wt 265 lb 8 oz (120.43 kg)  BMI 47.04 kg/m2  SpO2 96% Wt Readings from Last 3 Encounters:  06/30/13 265 lb 8 oz (120.43 kg)  05/28/13 266 lb (120.657 kg)  03/24/13 256 lb (116.121 kg)   Constitutional: obese, truncal obesity, in NAD but tearful Re: weight Eyes: PERRLA, EOMI, no exophthalmos ENT: moist mucous membranes, no thyromegaly, no cervical lymphadenopathy. Large neck. Cardiovascular: RRR, No MRG Respiratory: CTA B Gastrointestinal: abdomen soft, NT, ND, BS+ Musculoskeletal: no deformities, strength intact in all 4 Skin: moist, warm, no rashes Neurological: slight tremor with outstretched hands, DTR normal in all 4  ASSESSMENT: 1. Post-surgical Hypothyroidism  2. Obesity  PLAN:  1. Patient with iatrogenic hypothyroidism hypothyroidism, on levothyroxine therapy. She feels fatigued and continues to gain weight. She also has anxiety and is again tearful throughout the visit today as she describes her trouble with weight gain.  - We discussed about correct intake of levothyroxine, fasting, with water, separated by at least 30 minutes from breakfast, and separated by more than 4 hours from calcium, iron, multivitamins, acid reflux medications (PPIs). She does take it EVERY day, correctly - last set of TFTs were normal. From review of her previous test results and from her report, compliance used to be an issue for her, but now takes the Synthroid daily - will check thyroid tests today: TSH,  free T4 - If these are abnormal, she will need to return in 6-8 weeks for repeat labs - If these are normal, I will see her back in 6 months  2. Obesity - discussed at length about diet, made suggestions and given materials to read about a plant-based diet  Office Visit on  06/30/2013  Component Date Value Range Status  . TSH 06/30/2013 9.82* 0.35 - 5.50 uIU/mL Final  . Free T4 06/30/2013 1.02  0.60 - 1.60 ng/dL Final   Msg sent: Dear Ms Penni Homans, [facility] is high again. Since it was so variable in the past, let's recheck the tests in 5 weeks (can drop by [facility] lab) and if still high, will adjust the thyroid hormone dose then. Please take the dose every day. Good luck with the diet!  Sincerely, Philemon Kingdom MD

## 2013-06-30 NOTE — Patient Instructions (Signed)
Please come back for a follow-up appointment in 6 months Please stop at the lab >> I will send you the results through [place]. Please let me know how the diet is going. Here are the materials about the vegan diet:  Plant-based diet materials: - Lectures (you tube):  Alyssa Grove: "Breaking the Food Seduction"  Doug Lisle: "How to Lose Weight, without Losing Your Mind"  Shari Heritage: "What is Insulin Resistance" [email or web address]://www.woods-mathews.com/ - Documentaries:  [place] over [employer], Sick and Nearly Dead  [employer] - Books:  Alyssa Grove: "Program for Reversing Diabetes"  Heath Gold: "The [place] Study"  Norma Fredrickson: "Supermarket Vegan" (cookbook) - Facebook pages:   Moshe Salisbury versus Knives  Vegucated  [employer] - Healthy nutrition info websites:  https://www.martin.info/

## 2013-06-30 NOTE — Progress Notes (Signed)
Pre-visit discussion using our clinic review tool. No additional management support is needed unless otherwise documented below in the visit note.  

## 2013-07-02 ENCOUNTER — Telehealth: Payer: Self-pay | Admitting: Internal Medicine

## 2013-07-02 MED ORDER — VITAMIN D (ERGOCALCIFEROL) 1.25 MG (50000 UNIT) PO CAPS: 50000.0000 [IU] | ORAL_CAPSULE | ORAL | Status: DC

## 2013-07-02 NOTE — Telephone Encounter (Signed)
Spoke with patient informed her lab results. Verbalized understanding, no other physician has given her this. It was las prescribed by Dr. Gilford Rile and she has 4 there to take. New prescription sent to pharmacy for patient to have 12 week supply as instructed by physician.

## 2013-07-02 NOTE — Telephone Encounter (Signed)
Recent Vit D level was very low at 15.9. I would recommend starting high dose Vit D, Drisdol 50000IU weekly x 12 weeks. Please ask pt if another provider has already started this?

## 2013-07-21 ENCOUNTER — Ambulatory Visit (INDEPENDENT_AMBULATORY_CARE_PROVIDER_SITE_OTHER): Payer: No Typology Code available for payment source | Admitting: Adult Health

## 2013-07-21 ENCOUNTER — Encounter: Payer: Self-pay | Admitting: Adult Health

## 2013-07-21 VITALS — BP 122/68 | HR 89 | Resp 12 | Wt 265.0 lb

## 2013-07-21 DIAGNOSIS — M545 Low back pain, unspecified: Secondary | ICD-10-CM

## 2013-07-21 MED ORDER — TRAMADOL HCL 50 MG PO TABS: 50.0000 mg | ORAL_TABLET | Freq: Three times a day (TID) | ORAL | Status: DC | PRN

## 2013-07-21 MED ORDER — CYCLOBENZAPRINE HCL 5 MG PO TABS: 5.0000 mg | ORAL_TABLET | Freq: Three times a day (TID) | ORAL | Status: DC | PRN

## 2013-07-21 MED ORDER — PREDNISONE 10 MG PO TABS: ORAL_TABLET | ORAL | Status: DC

## 2013-07-21 NOTE — Progress Notes (Signed)
   Subjective:    Patient ID: Paula Garrett, female    DOB: Jun 23, 1965, 48 y.o.   MRN: [id number]  HPI  Pt is a 48 y/o female presents with back pain that started on Saturday. She rode to [place] and noticed the pain when she got out of the car. She has been taking BC powders, ibuprofen, excedrin, tylenol. She reports that none of these have been helping. She cannot take NSAIDs 2/2 hx of gastric bypass but took one anyway because nothing was helping. She has a hx of low back pain with sciatica. No symptoms of sciatica this time. She finds it very difficult to sit. Works as a Programmer, applications and sits all day.  Current Outpatient Prescriptions on File Prior to Visit  Medication Sig Dispense Refill  . ARIPiprazole (ABILIFY) 5 MG tablet Take 2.5 mg by mouth daily.       . cetirizine (ZYRTEC) 10 MG tablet Take 10 mg by mouth daily.      . clonazePAM (KLONOPIN) 0.5 MG tablet Take 1 tablet (0.5 mg total) by mouth 3 (three) times daily as needed for anxiety.  20 tablet  1  . CYANOCOBALAMIN IJ Inject 1,000 mg as directed every 30 (thirty) days.      Marland Kitchen levothyroxine (SYNTHROID, LEVOTHROID) 112 MCG tablet Take by mouth 1 tab of 112 mcg and 1 tab of 125 mcg daily.  60 tablet  3  . lisinopril (PRINIVIL,ZESTRIL) 10 MG tablet Take 1 tablet (10 mg total) by mouth daily.  90 tablet  3  . omeprazole (PRILOSEC) 40 MG capsule Take 1 capsule (40 mg total) by mouth daily.  30 capsule  11  . phentermine 37.5 MG capsule Take 1 capsule (37.5 mg total) by mouth every morning.  30 capsule  1  . sertraline (ZOLOFT) 100 MG tablet Take 200 mg by mouth daily. Take 1.5 tablets daily      . traZODone (DESYREL) 100 MG tablet Take 100 mg by mouth at bedtime.      . Vitamin D, Ergocalciferol, (DRISDOL) 50000 UNITS CAPS capsule Take 1 capsule (50,000 Units total) by mouth every 7 (seven) days.  30 capsule  0   No current facility-administered medications on file prior to visit.    Review of Systems  Constitutional: Negative.     Respiratory: Negative.   Cardiovascular: Negative.   Musculoskeletal: Positive for back pain.       Trouble sitting. Pain is significant. No radiating down leg.  Neurological: Negative for weakness and numbness.  Psychiatric/Behavioral: Negative.        Objective:   Physical Exam  Constitutional: She is oriented to person, place, and time.  Pt is overweight. Appears to be very uncomfortable. She is leaning over the exam table.  Cardiovascular: Normal rate and regular rhythm.   Pulmonary/Chest: Effort normal. No respiratory distress.  Musculoskeletal: She exhibits tenderness.  Guarded movement. Positive straight leg raise on the right.  Neurological: She is alert and oriented to person, place, and time.  Skin: Skin is warm and dry.  Psychiatric: She has a normal mood and affect. Her behavior is normal. Judgment and thought content normal.    BP 122/68  Pulse 89  Resp 12  Wt 265 lb (120.203 kg)  SpO2 95%       Assessment & Plan:

## 2013-07-21 NOTE — Patient Instructions (Addendum)
  Also begin a prednisone taper as follows:   Day one-6 tablets  Day 2 - 5 1/2 tablets  Day 3 - 5 tablets  Day 4 - 4 1/2 tablets  Day 5 - 4 tablets  Day 6 - 3 1/2 tablets  Day 7 - 3 tablets  Day 8 - 2 1/2 tablets  Day 9 - 2 tablets  Day 10 - 1 1/2 tablets  Day 11 - 1 tablet  Day 12 - 1/2 tablet and complete   Flexeril for muscle spasms. This may cause sedation. Do not drive while taking.  Also take flexeril for muscle spasms. This will make you sleepy so only take this at bedtime when you are working.  Apply ice alternating with heat to the affected areas for 15 min at a time. Do this approximately 3-4 times daily.  Use a firm pillow between your knees when you lie on your side or under your knees when you lie on your back.    Return to clinic if your symptoms are not improving within 4 weeks or sooner if your symptoms worsen.

## 2013-07-21 NOTE — Assessment & Plan Note (Signed)
Prednisone taper to reduce inflammation Flexeril for muscle spasms Ultram for pain  RTC if no improvement within 2 weeks or sooner if necessary.

## 2013-07-21 NOTE — Progress Notes (Signed)
Pre visit review using our clinic review tool, if applicable. No additional management support is needed unless otherwise documented below in the visit note. 

## 2013-07-23 ENCOUNTER — Telehealth: Payer: Self-pay | Admitting: Internal Medicine

## 2013-07-23 NOTE — Telephone Encounter (Signed)
Relevant patient education assigned to patient using Emmi. ° °

## 2013-08-04 ENCOUNTER — Other Ambulatory Visit: Payer: No Typology Code available for payment source

## 2013-08-05 ENCOUNTER — Other Ambulatory Visit (INDEPENDENT_AMBULATORY_CARE_PROVIDER_SITE_OTHER): Payer: No Typology Code available for payment source

## 2013-08-05 DIAGNOSIS — E890 Postprocedural hypothyroidism: Secondary | ICD-10-CM

## 2013-08-05 DIAGNOSIS — E039 Hypothyroidism, unspecified: Secondary | ICD-10-CM

## 2013-08-05 LAB — TSH: TSH: 3.53 u[IU]/mL (ref 0.35–5.50)

## 2013-08-05 LAB — T4, FREE: Free T4: 1 ng/dL (ref 0.60–1.60)

## 2013-09-03 ENCOUNTER — Encounter: Payer: Self-pay | Admitting: *Deleted

## 2013-09-03 ENCOUNTER — Other Ambulatory Visit: Payer: Self-pay | Admitting: Internal Medicine

## 2013-09-03 NOTE — Telephone Encounter (Signed)
Sent mychart message

## 2013-09-03 NOTE — Telephone Encounter (Signed)
May refill x 1. Needs a follow up.

## 2013-09-03 NOTE — Telephone Encounter (Signed)
Okay to refill? 

## 2013-09-04 ENCOUNTER — Other Ambulatory Visit: Payer: Self-pay | Admitting: Internal Medicine

## 2013-09-06 NOTE — Telephone Encounter (Signed)
Okay to refill? 

## 2013-09-28 ENCOUNTER — Other Ambulatory Visit: Payer: Self-pay | Admitting: Internal Medicine

## 2013-12-17 ENCOUNTER — Other Ambulatory Visit: Payer: Self-pay | Admitting: Internal Medicine

## 2014-01-28 ENCOUNTER — Ambulatory Visit (INDEPENDENT_AMBULATORY_CARE_PROVIDER_SITE_OTHER): Payer: No Typology Code available for payment source | Admitting: Internal Medicine

## 2014-01-28 ENCOUNTER — Encounter: Payer: Self-pay | Admitting: Internal Medicine

## 2014-01-28 VITALS — BP 122/86 | HR 90 | Temp 98.0°F | Ht 63.0 in | Wt 263.0 lb

## 2014-01-28 DIAGNOSIS — R079 Chest pain, unspecified: Secondary | ICD-10-CM | POA: Insufficient documentation

## 2014-01-28 DIAGNOSIS — R51 Headache: Secondary | ICD-10-CM

## 2014-01-28 DIAGNOSIS — R0600 Dyspnea, unspecified: Secondary | ICD-10-CM | POA: Insufficient documentation

## 2014-01-28 DIAGNOSIS — F17200 Nicotine dependence, unspecified, uncomplicated: Secondary | ICD-10-CM

## 2014-01-28 DIAGNOSIS — F419 Anxiety disorder, unspecified: Secondary | ICD-10-CM

## 2014-01-28 DIAGNOSIS — Z1239 Encounter for other screening for malignant neoplasm of breast: Secondary | ICD-10-CM

## 2014-01-28 DIAGNOSIS — R519 Headache, unspecified: Secondary | ICD-10-CM | POA: Insufficient documentation

## 2014-01-28 DIAGNOSIS — D649 Anemia, unspecified: Secondary | ICD-10-CM | POA: Insufficient documentation

## 2014-01-28 DIAGNOSIS — Z1211 Encounter for screening for malignant neoplasm of colon: Secondary | ICD-10-CM

## 2014-01-28 DIAGNOSIS — R0989 Other specified symptoms and signs involving the circulatory and respiratory systems: Secondary | ICD-10-CM

## 2014-01-28 DIAGNOSIS — F329 Major depressive disorder, single episode, unspecified: Secondary | ICD-10-CM

## 2014-01-28 DIAGNOSIS — R5381 Other malaise: Secondary | ICD-10-CM

## 2014-01-28 DIAGNOSIS — R0609 Other forms of dyspnea: Secondary | ICD-10-CM

## 2014-01-28 DIAGNOSIS — I10 Essential (primary) hypertension: Secondary | ICD-10-CM

## 2014-01-28 DIAGNOSIS — E039 Hypothyroidism, unspecified: Secondary | ICD-10-CM

## 2014-01-28 DIAGNOSIS — E669 Obesity, unspecified: Secondary | ICD-10-CM | POA: Insufficient documentation

## 2014-01-28 DIAGNOSIS — E559 Vitamin D deficiency, unspecified: Secondary | ICD-10-CM

## 2014-01-28 DIAGNOSIS — F341 Dysthymic disorder: Secondary | ICD-10-CM

## 2014-01-28 DIAGNOSIS — Z720 Tobacco use: Secondary | ICD-10-CM

## 2014-01-28 DIAGNOSIS — D6489 Other specified anemias: Secondary | ICD-10-CM | POA: Insufficient documentation

## 2014-01-28 DIAGNOSIS — R5383 Other fatigue: Secondary | ICD-10-CM

## 2014-01-28 LAB — COMPREHENSIVE METABOLIC PANEL
ALBUMIN: 3.3 g/dL — AB (ref 3.5–5.2)
ALT: 15 U/L (ref 0–35)
AST: 15 U/L (ref 0–37)
Alkaline Phosphatase: 47 U/L (ref 39–117)
BILIRUBIN TOTAL: 0.6 mg/dL (ref 0.2–1.2)
BUN: 14 mg/dL (ref 6–23)
CO2: 26 mEq/L (ref 19–32)
Calcium: 9.7 mg/dL (ref 8.4–10.5)
Chloride: 104 mEq/L (ref 96–112)
Creatinine, Ser: 0.7 mg/dL (ref 0.4–1.2)
GFR: 95.57 mL/min (ref >60.00)
GLUCOSE: 85 mg/dL (ref 70–99)
POTASSIUM: 4.1 meq/L (ref 3.5–5.1)
Sodium: 137 mEq/L (ref 135–145)
Total Protein: 7.6 g/dL (ref 6.0–8.3)

## 2014-01-28 LAB — CBC WITH DIFFERENTIAL/PLATELET
BASOS ABS: 0 10*3/uL (ref 0.0–0.1)
Basophils Relative: 0.6 % (ref 0.0–3.0)
EOS ABS: 0 10*3/uL (ref 0.0–0.7)
EOS PCT: 0.5 % (ref 0.0–5.0)
HCT: 38.6 % (ref 36.0–46.0)
Hemoglobin: 12.8 g/dL (ref 12.0–15.0)
Lymphocytes Relative: 30.7 % (ref 12.0–46.0)
Lymphs Abs: 2.2 10*3/uL (ref 0.7–4.0)
MCHC: 33.1 g/dL (ref 30.0–36.0)
MCV: 92.2 fl (ref 78.0–100.0)
Monocytes Absolute: 0.5 10*3/uL (ref 0.1–1.0)
Monocytes Relative: 7.4 % (ref 3.0–12.0)
NEUTROS PCT: 60.8 % (ref 43.0–77.0)
Neutro Abs: 4.4 10*3/uL (ref 1.4–7.7)
Platelets: 263 10*3/uL (ref 150.0–400.0)
RBC: 4.19 Mil/uL (ref 3.87–5.11)
RDW: 15.6 % — ABNORMAL HIGH (ref 11.5–15.5)
WBC: 7.3 10*3/uL (ref 4.0–10.5)

## 2014-01-28 LAB — LUTEINIZING HORMONE: LH: 4.74 m[IU]/mL

## 2014-01-28 LAB — FOLLICLE STIMULATING HORMONE: FSH: 11 m[IU]/mL

## 2014-01-28 LAB — HEMOGLOBIN A1C: Hgb A1c MFr Bld: 5.8 % (ref 4.6–6.5)

## 2014-01-28 LAB — VITAMIN D 25 HYDROXY (VIT D DEFICIENCY, FRACTURES): VITD: 40.78 ng/mL (ref 30.00–100.00)

## 2014-01-28 LAB — TSH: TSH: 7.78 u[IU]/mL — AB (ref 0.35–4.50)

## 2014-01-28 LAB — T4, FREE: FREE T4: 1.01 ng/dL (ref 0.60–1.60)

## 2014-01-28 LAB — FERRITIN: Ferritin: 20.8 ng/mL (ref 10.0–291.0)

## 2014-01-28 MED ORDER — LISINOPRIL 10 MG PO TABS: 10.0000 mg | ORAL_TABLET | Freq: Every day | ORAL | Status: DC

## 2014-01-28 MED ORDER — OMEPRAZOLE 40 MG PO CPDR: 40.0000 mg | DELAYED_RELEASE_CAPSULE | Freq: Every day | ORAL | Status: DC

## 2014-01-28 MED ORDER — PHENTERMINE HCL 37.5 MG PO CAPS
37.5000 mg | ORAL_CAPSULE | ORAL | Status: DC
Start: 2014-01-28 — End: 2014-03-02

## 2014-01-28 MED ORDER — SYNTHROID 112 MCG PO TABS: 112.0000 ug | ORAL_TABLET | Freq: Every day | ORAL | Status: DC

## 2014-01-28 NOTE — Assessment & Plan Note (Signed)
Symptoms relatively stable on current medications. Will continue.

## 2014-01-28 NOTE — Patient Instructions (Addendum)
We will set up cardiology evaluation and GI evaluation.  Restart phentermine 37.5mg  in the morning.  Follow up in 4 weeks to recheck weight and blood pressure.

## 2014-01-28 NOTE — Assessment & Plan Note (Signed)
BP Readings from Last 3 Encounters:  01/28/14 122/86  07/21/13 122/68  06/30/13 118/84   BP well controlled on Lisinopril. Will continue. Renal function with labs.

## 2014-01-28 NOTE — Assessment & Plan Note (Signed)
Will check TSH with labs. Continue Levothyroxine. 

## 2014-01-28 NOTE — Assessment & Plan Note (Signed)
Recent mild daily headaches. Question if this may be related to recent anemia. Will check CBC with blood counts. Also recommended sleep study, but pt declines. Continue prn excedrine for now. Follow up in 4 weeks.

## 2014-01-28 NOTE — Assessment & Plan Note (Signed)
Recent bilateral chest pain. Symptoms atypical for CAD, EKG normal. However pt does have risk for CAD including tobacco abuse, HTN and obesity. Will set up cardiology referral for possible stress test.

## 2014-01-28 NOTE — Addendum Note (Signed)
Addended by: Ronette Deter A on: 01/28/2014 09:06 AM   Modules accepted: Orders

## 2014-01-28 NOTE — Assessment & Plan Note (Signed)
Generalized fatigue. Likely multifactorial with morbid obesity, tobacco abuse. Will check CBC, CMP, TSH, with labs. Recommended sleep study which she declines.

## 2014-01-28 NOTE — Assessment & Plan Note (Signed)
Encouraged smoking cessation. Not ready to quit.

## 2014-01-28 NOTE — Assessment & Plan Note (Signed)
Will recheck Vit D with labs today. 

## 2014-01-28 NOTE — Progress Notes (Signed)
Pre visit review using our clinic review tool, if applicable. No additional management support is needed unless otherwise documented below in the visit note. 

## 2014-01-28 NOTE — Assessment & Plan Note (Signed)
Wt Readings from Last 3 Encounters:  01/28/14 263 lb (119.296 kg)  07/21/13 265 lb (120.203 kg)  06/30/13 265 lb 8 oz (120.43 kg)   Encouraged healthy diet and exercise. Will restart phentermine, after cardiology evaluation complete.

## 2014-01-28 NOTE — Progress Notes (Signed)
Subjective:    Patient ID: Paula Garrett, female    DOB: 1965/08/23, 48 y.o.   MRN: [id number]  HPI 48YO female presents for follow up.  Headaches - having almost every day. Sometimes behind right eye.  No visual changes. No nausea. Taking Excedrin with some improvement.  Feeling tired. Questions if menopause may be playing a role. Has some occasional chest pain right or left chest off and on last several months. Occurs at rest, resolves after a few moments.Has chronic shortness of breath, but no change from previous. Continues to smoke. Not ready to quit.   Frustrated by weight. Not following any diet or exercise program. Would like to get started back on phentermine. Had gastric bypass in the past.  Review of Systems  Constitutional: Positive for fatigue. Negative for fever, chills, appetite change and unexpected weight change.  Eyes: Negative for visual disturbance.  Respiratory: Positive for shortness of breath. Negative for cough and wheezing.   Cardiovascular: Positive for chest pain. Negative for palpitations and leg swelling.  Gastrointestinal: Negative for abdominal pain, diarrhea and constipation.  Skin: Negative for color change and rash.  Neurological: Positive for headaches. Negative for weakness, light-headedness and numbness.  Hematological: Negative for adenopathy. Does not bruise/bleed easily.  Psychiatric/Behavioral: Positive for dysphoric mood. Negative for suicidal ideas and sleep disturbance. The patient is nervous/anxious.        Objective:    BP 122/86  Pulse 90  Temp(Src) 98 F (36.7 C) (Oral)  Ht 5\' 3"  (1.6 m)  Wt 263 lb (119.296 kg)  BMI 46.60 kg/m2  SpO2 95% Physical Exam  Constitutional: She is oriented to person, place, and time. She appears well-developed and well-nourished. No distress.  HENT:  Head: Normocephalic and atraumatic.  Right Ear: External ear normal.  Left Ear: External ear normal.  Nose: Nose normal.  Mouth/Throat: Oropharynx  is clear and moist. No oropharyngeal exudate.  Eyes: Conjunctivae are normal. Pupils are equal, round, and reactive to light. Right eye exhibits no discharge. Left eye exhibits no discharge. No scleral icterus.  Neck: Normal range of motion. Neck supple. No tracheal deviation present. No thyromegaly present.  Cardiovascular: Normal rate, regular rhythm, normal heart sounds and intact distal pulses.  Exam reveals no gallop and no friction rub.   No murmur heard. Pulmonary/Chest: Effort normal and breath sounds normal. No accessory muscle usage. Not tachypneic. No respiratory distress. She has no decreased breath sounds. She has no wheezes. She has no rhonchi. She has no rales. She exhibits no tenderness.  Musculoskeletal: Normal range of motion. She exhibits no edema and no tenderness.  Lymphadenopathy:    She has no cervical adenopathy.  Neurological: She is alert and oriented to person, place, and time. No cranial nerve deficit. She exhibits normal muscle tone. Coordination normal.  Skin: Skin is warm and dry. No rash noted. She is not diaphoretic. No erythema. No pallor.  Psychiatric: Her behavior is normal. Judgment and thought content normal. Her mood appears anxious. She exhibits a depressed mood.          Assessment & Plan:   Problem List Items Addressed This Visit     Unprioritized   Absolute anemia   Relevant Orders      CBC with Differential      Ferritin   Anxiety and depression - Primary     Symptoms relatively stable on current medications. Will continue.    Dyspnea     Mild chronic dyspnea. Likely secondary to obesity and smoking.  Encouraged weight loss and smoking cessation. Recommended sleep study to evaluate for sleep apnea, however she declines. Will set up cardiology eval given ongoing chest pain. We also discussed pulmonary referral for PFTs.    Relevant Orders      Ambulatory referral to Cardiology   Essential hypertension, benign      BP Readings from Last 3  Encounters:  01/28/14 122/86  07/21/13 122/68  06/30/13 118/84   BP well controlled on Lisinopril. Will continue. Renal function with labs.    Relevant Medications      lisinopril (PRINIVIL,ZESTRIL) tablet   Other Relevant Orders      Comprehensive metabolic panel   WCHENIDP(824.2)     Recent mild daily headaches. Question if this may be related to recent anemia. Will check CBC with blood counts. Also recommended sleep study, but pt declines. Continue prn excedrine for now. Follow up in 4 weeks.    Relevant Medications      clonazePAM (KLONOPIN) 0.5 MG tablet   Hypothyroidism     Will check TSH with labs. Continue Levothyroxine.    Relevant Medications      SYNTHROID 112 MCG tablet   Other Relevant Orders      TSH      T4, free   Other malaise and fatigue     Generalized fatigue. Likely multifactorial with morbid obesity, tobacco abuse. Will check CBC, CMP, TSH, with labs. Recommended sleep study which she declines.    Relevant Orders      LH      FSH   Pain in the chest     Recent bilateral chest pain. Symptoms atypical for CAD, EKG normal. However pt does have risk for CAD including tobacco abuse, HTN and obesity. Will set up cardiology referral for possible stress test.    Relevant Orders      Ambulatory referral to Cardiology      EKG 12-Lead (Completed)   Screening for breast cancer   Relevant Orders      MM Digital Screening   Severe obesity (BMI >= 40)      Wt Readings from Last 3 Encounters:  01/28/14 263 lb (119.296 kg)  07/21/13 265 lb (120.203 kg)  06/30/13 265 lb 8 oz (120.43 kg)   Encouraged healthy diet and exercise. Will restart phentermine, after cardiology evaluation complete.    Relevant Medications      phentermine capsule   Special screening for malignant neoplasms, colon   Relevant Orders      Ambulatory referral to Gastroenterology   Tobacco abuse     Encouraged smoking cessation. Not ready to quit.    Unspecified vitamin D deficiency      Will recheck Vit D with labs today.    Relevant Orders      Vit D  25 hydroxy (rtn osteoporosis monitoring)       Return in about 4 weeks (around 02/25/2014) for Recheck Weight.

## 2014-01-28 NOTE — Assessment & Plan Note (Signed)
Mild chronic dyspnea. Likely secondary to obesity and smoking. Encouraged weight loss and smoking cessation. Recommended sleep study to evaluate for sleep apnea, however she declines. Will set up cardiology eval given ongoing chest pain. We also discussed pulmonary referral for PFTs.

## 2014-02-01 ENCOUNTER — Other Ambulatory Visit: Payer: Self-pay | Admitting: *Deleted

## 2014-02-01 ENCOUNTER — Telehealth: Payer: Self-pay | Admitting: *Deleted

## 2014-02-01 MED ORDER — SYNTHROID 112 MCG PO TABS: 224.0000 ug | ORAL_TABLET | Freq: Every day | ORAL | Status: DC

## 2014-02-01 NOTE — Telephone Encounter (Signed)
Spoke with pt she states she has not seen Endocrinology.  She further states she is taking 2-112 mcg tablets of Synthroid

## 2014-02-01 NOTE — Telephone Encounter (Signed)
Fax received from pharmacy requesting Synthroid dosing.  Last refill on 4.14.15 pt was to take 2 Synthroid (224 mcg total) before breakfast however the 8.14.15 Rx stated 1 Synthroid (112 mcg total).  Please advise

## 2014-02-01 NOTE — Telephone Encounter (Signed)
Please confirm with pt, to be sure she has not seen endocrinology and they have made a change, but there should be no change in her dose, so should continue 270mcg daily for now.

## 2014-02-11 ENCOUNTER — Ambulatory Visit: Payer: No Typology Code available for payment source | Admitting: Cardiovascular Disease

## 2014-03-02 ENCOUNTER — Ambulatory Visit (INDEPENDENT_AMBULATORY_CARE_PROVIDER_SITE_OTHER): Payer: No Typology Code available for payment source | Admitting: Internal Medicine

## 2014-03-02 ENCOUNTER — Ambulatory Visit: Payer: Self-pay | Admitting: Internal Medicine

## 2014-03-02 ENCOUNTER — Encounter: Payer: Self-pay | Admitting: Internal Medicine

## 2014-03-02 VITALS — BP 130/92 | HR 78 | Temp 97.9°F | Ht 63.0 in | Wt 265.8 lb

## 2014-03-02 DIAGNOSIS — F419 Anxiety disorder, unspecified: Principal | ICD-10-CM

## 2014-03-02 DIAGNOSIS — F329 Major depressive disorder, single episode, unspecified: Secondary | ICD-10-CM

## 2014-03-02 DIAGNOSIS — F341 Dysthymic disorder: Secondary | ICD-10-CM

## 2014-03-02 DIAGNOSIS — I10 Essential (primary) hypertension: Secondary | ICD-10-CM

## 2014-03-02 LAB — HM MAMMOGRAPHY: HM Mammogram: NEGATIVE

## 2014-03-02 MED ORDER — CLONAZEPAM 1 MG PO TABS: 1.0000 mg | ORAL_TABLET | Freq: Two times a day (BID) | ORAL | Status: DC

## 2014-03-02 MED ORDER — CLONAZEPAM 0.5 MG PO TABS: 1.0000 mg | ORAL_TABLET | Freq: Two times a day (BID) | ORAL | Status: DC

## 2014-03-02 NOTE — Assessment & Plan Note (Signed)
BP Readings from Last 3 Encounters:  03/02/14 130/92  01/28/14 122/86  07/21/13 122/68   BP elevated today, however pt crying throughout visit. Encouraged her to keep follow up with Dr. Fletcher Anon.

## 2014-03-02 NOTE — Assessment & Plan Note (Signed)
Wt Readings from Last 3 Encounters:  03/02/14 265 lb 12 oz (120.543 kg)  01/28/14 263 lb (119.296 kg)  07/21/13 265 lb (120.203 kg)   Body mass index is 47.09 kg/(m^2). Encouraged healthy diet and exercise. She will also look into follow up with bariatric surgery.

## 2014-03-02 NOTE — Assessment & Plan Note (Signed)
Symptoms of anxiety poorly controlled. Will continue Sertraline and increase Clonazepam to 1mg  po bid. Follow up in 4 weeks or sooner as needed.

## 2014-03-02 NOTE — Progress Notes (Signed)
Subjective:    Patient ID: Paula Garrett, female    DOB: 1965/08/21, 48 y.o.   MRN: [id number]  HPI 48YO female presents for follow up.  Tearful today. Frustrated by psychiatrist, unable to find medications to help control anxiety. Depression is well controlled. Feeling anxious all of the time. Husband just had stroke, at age 21.   Frustrated by inability to lose weight. 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 Considering following up with bariatric surgery. Also working on [employer] and exercise with husband.   Review of Systems  Constitutional: Negative for fever, chills, appetite change, fatigue and unexpected weight change.  Eyes: Negative for visual disturbance.  Respiratory: Negative for shortness of breath.   Cardiovascular: Negative for chest pain and leg swelling.  Gastrointestinal: Negative for abdominal pain.  Skin: Negative for color change and rash.  Hematological: Negative for adenopathy. Does not bruise/bleed easily.  Psychiatric/Behavioral: Positive for sleep disturbance and dysphoric mood. Negative for suicidal ideas. The patient is nervous/anxious.        Objective:    BP 130/92  Pulse 78  Temp(Src) 97.9 F (36.6 C) (Oral)  Ht 5\' 3"  (1.6 m)  Wt 265 lb 12 oz (120.543 kg)  BMI 47.09 kg/m2  SpO2 95% Physical Exam  Constitutional: She is oriented to person, place, and time. She appears well-developed and well-nourished. No distress.  HENT:  Head: Normocephalic and atraumatic.  Right Ear: External ear normal.  Left Ear: External ear normal.  Nose: Nose normal.  Mouth/Throat: Oropharynx is clear and moist. No oropharyngeal exudate.  Eyes: Conjunctivae are normal. Pupils are equal, round, and reactive to light. Right eye exhibits no discharge. Left eye exhibits no discharge. No scleral icterus.  Neck: Normal range of motion. Neck supple. No tracheal deviation present. No thyromegaly present.  Cardiovascular: Normal rate, regular  rhythm, normal heart sounds and intact distal pulses.  Exam reveals no gallop and no friction rub.   No murmur heard. Pulmonary/Chest: Effort normal and breath sounds normal. No accessory muscle usage. Not tachypneic. No respiratory distress. She has no decreased breath sounds. She has no wheezes. She has no rhonchi. She has no rales. She exhibits no tenderness.  Musculoskeletal: Normal range of motion. She exhibits no edema and no tenderness.  Lymphadenopathy:    She has no cervical adenopathy.  Neurological: She is alert and oriented to person, place, and time. No cranial nerve deficit. She exhibits normal muscle tone. Coordination normal.  Skin: Skin is warm and dry. No rash noted. She is not diaphoretic. No erythema. No pallor.  Psychiatric: Her speech is normal. Judgment and thought content normal. Her mood appears anxious. She is agitated. Cognition and memory are normal. She exhibits a depressed mood. She expresses no suicidal ideation.          Assessment & Plan:   Problem List Items Addressed This Visit     Unprioritized   Anxiety and depression - Primary     Symptoms of anxiety poorly controlled. Will continue Sertraline and increase Clonazepam to 1mg  po bid. Follow up in 4 weeks or sooner as needed.    Essential hypertension, benign      BP Readings from Last 3 Encounters:  03/02/14 130/92  01/28/14 122/86  07/21/13 122/68   BP elevated today, however pt crying throughout visit. Encouraged her to keep follow up with Dr. Fletcher Anon.    Severe obesity (BMI >= 40)      Wt Readings from Last 3 Encounters:  03/02/14 265 lb 12 oz (120.543 kg)  01/28/14 263 lb (119.296 kg)  07/21/13 265 lb (120.203 kg)   Body mass index is 47.09 kg/(m^2). Encouraged healthy diet and exercise. She will also look into follow up with bariatric surgery.        Return in about 4 weeks (around 03/30/2014) for Recheck.

## 2014-03-02 NOTE — Progress Notes (Signed)
Pre visit review using our clinic review tool, if applicable. No additional management support is needed unless otherwise documented below in the visit note. 

## 2014-03-02 NOTE — Patient Instructions (Signed)
Increase Clonazepam to 1mg  twice daily as needed.  Follow up in 4 weeks or sooner as needed.

## 2014-03-03 ENCOUNTER — Encounter: Payer: Self-pay | Admitting: *Deleted

## 2014-03-07 ENCOUNTER — Ambulatory Visit (INDEPENDENT_AMBULATORY_CARE_PROVIDER_SITE_OTHER): Payer: No Typology Code available for payment source | Admitting: Cardiovascular Disease

## 2014-03-07 ENCOUNTER — Encounter: Payer: Self-pay | Admitting: Cardiovascular Disease

## 2014-03-07 VITALS — BP 131/88 | HR 75 | Ht 63.0 in | Wt 264.2 lb

## 2014-03-07 DIAGNOSIS — I10 Essential (primary) hypertension: Secondary | ICD-10-CM

## 2014-03-07 DIAGNOSIS — R079 Chest pain, unspecified: Secondary | ICD-10-CM

## 2014-03-07 DIAGNOSIS — R0789 Other chest pain: Secondary | ICD-10-CM

## 2014-03-07 DIAGNOSIS — F17200 Nicotine dependence, unspecified, uncomplicated: Secondary | ICD-10-CM

## 2014-03-07 DIAGNOSIS — Z720 Tobacco use: Secondary | ICD-10-CM

## 2014-03-07 NOTE — Assessment & Plan Note (Signed)
Chest pain is overall atypical with GI features. Anxiety might also be contributing. Nonetheless, she had significant exertional dyspnea with multiple risk factors for coronary artery disease including tobacco use, hypertension and family history of CAD. I recommend evaluation with a pharmacologic nuclear stress test. She reports inability to exercise on a treadmill due to significant shortness of breath. I discussed with the patient the importance of lifestyle changes in order to decrease the chance of future coronary artery disease and cardiovascular events. We discussed the importance of controlling risk factors, healthy diet as well as regular exercise. I also explained to him that a normal stress test does not rule out atherosclerosis.

## 2014-03-07 NOTE — Assessment & Plan Note (Signed)
Blood pressure is reasonably controlled. 

## 2014-03-07 NOTE — Patient Instructions (Addendum)
[facility] [place]  Your caregiver has ordered a Stress Test with nuclear imaging. The purpose of this test is to evaluate the blood supply to your heart muscle. This procedure is referred to as a "Non-Invasive Stress Test." This is because other than having an IV started in your vein, nothing is inserted or "invades" your body. Cardiac stress tests are done to find areas of poor blood flow to the heart by determining the extent of coronary artery disease (CAD). Some patients exercise on a treadmill, which naturally increases the blood flow to your heart, while others who are  unable to walk on a treadmill due to physical limitations have a pharmacologic/chemical stress agent called Lexiscan . This medicine will mimic walking on a treadmill by temporarily increasing your coronary blood flow.   Please note: these test may take anywhere between 2-4 hours to complete  PLEASE REPORT TO [place] AT THE FIRST DESK WILL DIRECT YOU WHERE TO GO  Date of Procedure:____09/24/15_________________________________  Arrival Time for Procedure:_____0715 am _________________________   PLEASE NOTIFY THE OFFICE AT LEAST 24 HOURS IN ADVANCE IF YOU ARE UNABLE TO KEEP YOUR APPOINTMENT.  [phone] AND  PLEASE NOTIFY NUCLEAR MEDICINE AT [facility] AT LEAST 24 HOURS IN ADVANCE IF YOU ARE UNABLE TO KEEP YOUR APPOINTMENT. [phone]  How to prepare for your Myoview test:  1. Do not eat or drink after midnight 2. No caffeine for 24 hours prior to test 3. No smoking 24 hours prior to test. 4. Your medication may be taken with water.  If your doctor stopped a medication because of this test, do not take that medication. 5. Ladies, please do not wear dresses.  Skirts or pants are appropriate. Please wear a short sleeve shirt. 6. No perfume, cologne or lotion. 7. Wear comfortable walking shoes. No heels!   Your physician recommends that you schedule a follow-up appointment in:  As needed

## 2014-03-07 NOTE — Progress Notes (Signed)
Primary care physician: Dr. Gilford Rile  HPI  This is a pleasant 48 year old female who was referred for evaluation of chest pain. She has no previous cardiac history. She has known history of hypertension, tobacco use, gastroesophageal reflux disease, stress, anxiety and family history of coronary artery disease. She reports intermittent episodes of substernal chest pain described as aching and occasional heartburn. Sometimes it's worse with certain food. She does complain of significant exertional dyspnea and thinks that she might have early COPD. She describes dyspnea with minimal activities. She continues to smoke one pack per day. She is not aware of previous cardiac workup.  No Known Allergies   Current Outpatient Prescriptions on File Prior to Visit  Medication Sig Dispense Refill  . cetirizine (ZYRTEC) 10 MG tablet Take 10 mg by mouth daily.      . clonazePAM (KLONOPIN) 1 MG tablet Take 1 tablet (1 mg total) by mouth 2 (two) times daily.  60 tablet  3  . CYANOCOBALAMIN IJ Inject 1,000 mg as directed every 30 (thirty) days.      Marland Kitchen lisinopril (PRINIVIL,ZESTRIL) 10 MG tablet Take 1 tablet (10 mg total) by mouth daily.  90 tablet  3  . omeprazole (PRILOSEC) 40 MG capsule Take 1 capsule (40 mg total) by mouth daily.  90 capsule  3  . sertraline (ZOLOFT) 100 MG tablet Take 200 mg by mouth daily. Take 1.5 tablets daily      . SYNTHROID 112 MCG tablet Take 2 tablets (224 mcg total) by mouth daily before breakfast.  60 tablet  3  . Vitamin D, Ergocalciferol, (DRISDOL) 50000 UNITS CAPS capsule Take 1 capsule (50,000 Units total) by mouth every 7 (seven) days.  30 capsule  0   No current facility-administered medications on file prior to visit.     Past Medical History  Diagnosis Date  . Depression   . Arthritis   . GERD (gastroesophageal reflux disease)   . Thyroid disease      Past Surgical History  Procedure Laterality Date  . Gastric bypass  2005  . Total thyroidectomy    . Tubal  ligation    . Breast surgery      cyst aspiration  . Nasal sinus surgery    . Breast surgery  1998    cyst aspiration  . Ablation  2006     Family History  Problem Relation Age of Onset  . Heart disease Mother   . COPD Mother   . Arthritis Mother   . Cancer Mother     uterine  . COPD Father   . Arthritis Father   . Lupus Father   . Diabetes Maternal Grandmother   . Diabetes Maternal Grandfather   . Diabetes Paternal Grandmother   . Diabetes Paternal Grandfather      History   Social History  . Marital Status: Married    Spouse Name: N/A    Number of Children: N/A  . Years of Education: N/A   Occupational History  . Not on file.   Social History Main Topics  . Smoking status: Current Every Day Smoker -- 1.00 packs/day for 30 years    Types: Cigarettes  . Smokeless tobacco: Never Used  . Alcohol Use: 1.0 oz/week    2 drink(s) per week  . Drug Use: No  . Sexual Activity: Yes    Partners: Male   Other Topics Concern  . Not on file   Social History Narrative   Lives in [place] with husband.  Work - [employer]      Diet - healthy diet   Exercise - limited   Caffeine use: daily     ROS A 10 point review of system was performed. It is negative other than that mentioned in the history of present illness.   PHYSICAL EXAM   BP 131/88  Pulse 75  Ht 5\' 3"  (1.6 m)  Wt 264 lb 4 oz (119.863 kg)  BMI 46.82 kg/m2 Constitutional: She is oriented to person, place, and time. She appears well-developed and well-nourished. No distress.  HENT: No nasal discharge.  Head: Normocephalic and atraumatic.  Eyes: Pupils are equal and round. No discharge.  Neck: Normal range of motion. Neck supple. No JVD present. No thyromegaly present.  Cardiovascular: Normal rate, regular rhythm, normal heart sounds. Exam reveals no gallop and no friction rub. No murmur heard.  Pulmonary/Chest: Effort normal and breath sounds normal. No stridor. No respiratory distress. She  has no wheezes. She has no rales. She exhibits no tenderness.  Abdominal: Soft. Bowel sounds are normal. She exhibits no distension. There is no tenderness. There is no rebound and no guarding.  Musculoskeletal: Normal range of motion. She exhibits no edema and no tenderness.  Neurological: She is alert and oriented to person, place, and time. Coordination normal.  Skin: Skin is warm and dry. No rash noted. She is not diaphoretic. No erythema. No pallor.  Psychiatric: She has a normal mood and affect. Her behavior is normal. Judgment and thought content normal.     EKG: Sinus  Rhythm  WITHIN NORMAL LIMITS    ASSESSMENT AND PLAN

## 2014-03-07 NOTE — Assessment & Plan Note (Signed)
I discussed with him the importance of smoking cessation. 

## 2014-03-10 ENCOUNTER — Ambulatory Visit: Payer: Self-pay | Admitting: Cardiovascular Disease

## 2014-03-10 DIAGNOSIS — R079 Chest pain, unspecified: Secondary | ICD-10-CM

## 2014-03-11 ENCOUNTER — Other Ambulatory Visit: Payer: Self-pay

## 2014-03-11 DIAGNOSIS — R079 Chest pain, unspecified: Secondary | ICD-10-CM

## 2014-03-16 ENCOUNTER — Telehealth: Payer: Self-pay | Admitting: *Deleted

## 2014-03-16 NOTE — Telephone Encounter (Signed)
Cardiac clearance faxed to [facility] clinic

## 2014-03-17 ENCOUNTER — Ambulatory Visit: Payer: No Typology Code available for payment source | Admitting: Internal Medicine

## 2014-03-17 HISTORY — PX: COLONOSCOPY: SHX174

## 2014-03-26 LAB — HM COLONOSCOPY

## 2014-03-31 ENCOUNTER — Ambulatory Visit: Payer: No Typology Code available for payment source | Admitting: Internal Medicine

## 2014-04-04 ENCOUNTER — Encounter: Payer: Self-pay | Admitting: Internal Medicine

## 2014-04-04 ENCOUNTER — Ambulatory Visit (INDEPENDENT_AMBULATORY_CARE_PROVIDER_SITE_OTHER): Payer: No Typology Code available for payment source | Admitting: Internal Medicine

## 2014-04-04 VITALS — BP 152/92 | HR 73 | Temp 98.1°F | Ht 63.0 in | Wt 262.8 lb

## 2014-04-04 DIAGNOSIS — F419 Anxiety disorder, unspecified: Principal | ICD-10-CM

## 2014-04-04 DIAGNOSIS — E039 Hypothyroidism, unspecified: Secondary | ICD-10-CM

## 2014-04-04 DIAGNOSIS — F418 Other specified anxiety disorders: Secondary | ICD-10-CM

## 2014-04-04 DIAGNOSIS — I10 Essential (primary) hypertension: Secondary | ICD-10-CM

## 2014-04-04 DIAGNOSIS — F329 Major depressive disorder, single episode, unspecified: Secondary | ICD-10-CM

## 2014-04-04 DIAGNOSIS — F32A Depression, unspecified: Secondary | ICD-10-CM

## 2014-04-04 LAB — T4, FREE: FREE T4: 0.95 ng/dL (ref 0.60–1.60)

## 2014-04-04 LAB — TSH: TSH: 10.09 u[IU]/mL — ABNORMAL HIGH (ref 0.35–4.50)

## 2014-04-04 NOTE — Patient Instructions (Addendum)
Labs today.  Follow up in 4 weeks. 

## 2014-04-04 NOTE — Progress Notes (Signed)
   Subjective:    Patient ID: Paula Garrett, female    DOB: 1965/06/20, 48 y.o.   MRN: [id number]  HPI 48YO female presents for follow up.  Recently evaluated by psychiatry for chest pain. Stress test was normal. Also recently had dose of Clonazepam increased to better control anxiety. Feels that dose of Clonazepam is helpful. Anxiety better controlled.  Scheduled for colonoscopy and endoscopy at [facility].  Review of Systems  Constitutional: Negative for fever, chills, appetite change, fatigue and unexpected weight change.  Eyes: Negative for visual disturbance.  Respiratory: Negative for shortness of breath.   Cardiovascular: Negative for chest pain and leg swelling.  Gastrointestinal: Negative for abdominal pain.  Skin: Negative for color change and rash.  Hematological: Negative for adenopathy. Does not bruise/bleed easily.  Psychiatric/Behavioral: Negative for dysphoric mood. The patient is nervous/anxious.        Objective:    BP 152/92  Pulse 73  Temp(Src) 98.1 F (36.7 C) (Oral)  Ht 5\' 3"  (1.6 m)  Wt 262 lb 12 oz (119.183 kg)  BMI 46.56 kg/m2  SpO2 96% Physical Exam  Constitutional: She is oriented to person, place, and time. She appears well-developed and well-nourished. No distress.  HENT:  Head: Normocephalic and atraumatic.  Right Ear: External ear normal.  Left Ear: External ear normal.  Nose: Nose normal.  Mouth/Throat: Oropharynx is clear and moist. No oropharyngeal exudate.  Eyes: Conjunctivae are normal. Pupils are equal, round, and reactive to light. Right eye exhibits no discharge. Left eye exhibits no discharge. No scleral icterus.  Neck: Normal range of motion. Neck supple. No tracheal deviation present. No thyromegaly present.  Cardiovascular: Normal rate, regular rhythm, normal heart sounds and intact distal pulses.  Exam reveals no gallop and no friction rub.   No murmur heard. Pulmonary/Chest: Effort normal and breath sounds normal. No  accessory muscle usage. Not tachypneic. No respiratory distress. She has no decreased breath sounds. She has no wheezes. She has no rhonchi. She has no rales. She exhibits no tenderness.  Musculoskeletal: Normal range of motion. She exhibits no edema and no tenderness.  Lymphadenopathy:    She has no cervical adenopathy.  Neurological: She is alert and oriented to person, place, and time. No cranial nerve deficit. She exhibits normal muscle tone. Coordination normal.  Skin: Skin is warm and dry. No rash noted. She is not diaphoretic. No erythema. No pallor.  Psychiatric: She has a normal mood and affect. Her behavior is normal. Judgment and thought content normal.          Assessment & Plan:   Problem List Items Addressed This Visit     Unprioritized   Anxiety and depression - Primary     Symptoms improved with higher dose of Clonazepam and continued Sertraline. Will continue.    Essential hypertension, benign      BP Readings from Last 3 Encounters:  04/04/14 152/92  03/07/14 131/88  03/02/14 130/92   BP elevated today, but has been generally well controlled. Will continue Lisinopril, recheck BP in 4 weeks.    Hypothyroidism     Last TSH elevated. Will recheck TSH and T4 with labs today.     Relevant Orders      TSH      T4, free       Return in about 4 weeks (around 05/02/2014) for Recheck.

## 2014-04-04 NOTE — Progress Notes (Signed)
Pre visit review using our clinic review tool, if applicable. No additional management support is needed unless otherwise documented below in the visit note. 

## 2014-04-04 NOTE — Assessment & Plan Note (Signed)
BP Readings from Last 3 Encounters:  04/04/14 152/92  03/07/14 131/88  03/02/14 130/92   BP elevated today, but has been generally well controlled. Will continue Lisinopril, recheck BP in 4 weeks.

## 2014-04-04 NOTE — Assessment & Plan Note (Signed)
Last TSH elevated. Will recheck TSH and T4 with labs today.

## 2014-04-04 NOTE — Assessment & Plan Note (Signed)
Symptoms improved with higher dose of Clonazepam and continued Sertraline. Will continue.

## 2014-04-05 ENCOUNTER — Ambulatory Visit: Payer: Self-pay | Admitting: Gastroenterology

## 2014-04-05 ENCOUNTER — Telehealth: Payer: Self-pay | Admitting: Internal Medicine

## 2014-04-05 LAB — HM COLONOSCOPY

## 2014-04-05 NOTE — Telephone Encounter (Signed)
emmi emailed °

## 2014-04-06 ENCOUNTER — Encounter: Payer: Self-pay | Admitting: Internal Medicine

## 2014-04-15 NOTE — Telephone Encounter (Signed)
The patient has scheduled an apt with Dr.Phadke

## 2014-04-27 ENCOUNTER — Encounter: Payer: Self-pay | Admitting: Internal Medicine

## 2014-05-03 ENCOUNTER — Encounter: Payer: Self-pay | Admitting: Endocrinology

## 2014-05-03 ENCOUNTER — Ambulatory Visit (INDEPENDENT_AMBULATORY_CARE_PROVIDER_SITE_OTHER): Payer: No Typology Code available for payment source | Admitting: Endocrinology

## 2014-05-03 VITALS — BP 144/86 | HR 89 | Wt 268.2 lb

## 2014-05-03 DIAGNOSIS — E890 Postprocedural hypothyroidism: Secondary | ICD-10-CM

## 2014-05-03 LAB — T4, FREE: Free T4: 0.81 ng/dL (ref 0.60–1.60)

## 2014-05-03 LAB — TSH: TSH: 10.4 u[IU]/mL — ABNORMAL HIGH (ref 0.35–4.50)

## 2014-05-03 NOTE — Patient Instructions (Addendum)
Is on a high dose of Synthroid. 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  Check thyroid levels today and reassess levels in 6 weeks. Most likely will need a higher dose ~237 mcg daily.   Please come back for a follow-up appointment in 6 weeks

## 2014-05-03 NOTE — Assessment & Plan Note (Signed)
Last TSH was elevated about 4 weeks ago. Will reassess her thyroid function today.  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   She appears to be taking her Synthroid correctly and not missed any doses except today.   If TSH still elevated, then will likely increase dose of Synthroid to 237 mcg daily.   RTC 6 weeks.

## 2014-05-03 NOTE — Progress Notes (Signed)
Reason for visit: Hypothyroidism  HPI  Paula Garrett is a 48 y.o.-year-old female, referred by her PCP, Rica Mast, MD,  for evaluation for hypothyroidism. Last seen by Dr Cruzita Lederer in 06/2013.   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 She was tried on generic levothyroxine and took it for about 6 months at that time. This medication "didnt work" and she has been on brand Synthroid since then.  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   Patient has been taking this medication fasting, with water, separate from breakfast > 30 minutes and from pills ( calcium, iron, PPIs, multivitamins)> 4 hours. Denies missing any doses recently, except didn't take a dose this morning. Since last week is working night and day shifts intermittently.  Her dose of Synthroid hasn't been changed recently.   I reviewed pt's thyroid tests: Lab Results  Component Value Date   TSH 10.09* 04/04/2014   TSH 7.78* 01/28/2014   TSH 3.53 08/05/2013   TSH 9.82* 06/30/2013   FREET4 0.95 04/04/2014   FREET4 1.01 01/28/2014   FREET4 1.00 08/05/2013   FREET4 1.02 06/30/2013     Review of systems: [ x  ] complains of    [  ] denies [ x ] weight gain, slow and progressive of close to 48 lbs in past 2 years  [  ] constipation  [ x ] fatigue, chronic since thyroid surgery   [  ] dry skin  [  ] cold intolerance [  ] hair loss [  ] noticing any enlargement in size of thyroid [  ] lumps in neck [  ] dysphagia [  ] change in voice [  ]  SOB with lying down.  No Family  Or personal history of thyroid cancer.  No history of XRT to head or neck.   I reviewed her chart and she also has a history of anxiety/depression and morbid obesity. She has tried phentermine this year with little success ( treated by Dr walker)   Current  Outpatient Prescriptions on File Prior to Visit  Medication Sig Dispense Refill  . cetirizine (ZYRTEC) 10 MG tablet Take 10 mg by mouth daily.    . clonazePAM (KLONOPIN) 1 MG tablet Take 1 tablet (1 mg total) by mouth 2 (two) times daily. 60 tablet 3  . CYANOCOBALAMIN IJ Inject 1,000 mg as directed every 30 (thirty) days.    Marland Kitchen lisinopril (PRINIVIL,ZESTRIL) 10 MG tablet Take 1 tablet (10 mg total) by mouth daily. 90 tablet 3  . pantoprazole (PROTONIX) 40 MG tablet Take 40 mg by mouth daily.    . sertraline (ZOLOFT) 100 MG tablet Take 200 mg by mouth daily.     Marland Kitchen SYNTHROID 112 MCG tablet Take 2 tablets (224 mcg total) by mouth daily before breakfast. 60 tablet 3  . Vitamin D, Ergocalciferol, (DRISDOL) 50000 UNITS CAPS capsule Take 1 capsule (50,000 Units total) by mouth every 7 (seven) days. 30 capsule 0   No current facility-administered medications on file prior to visit.   No Known Allergies     ROS: Review of Systems: [x]  complains of  [  ] denies General:   [x  ] Recent weight change [ x ] Fatigue  [  ] Loss of appetite Eyes: [  ]  Vision Difficulty [  ]  Eye pain  ENT: [  ]  Hearing difficulty [  ]  Difficulty Swallowing CVS: [  ] Chest pain [  ]  Palpitations/Irregular Heart beat [  ]  Shortness of breath lying flat [  ] Swelling of legs Resp: [  ] Frequent Cough [  ] Shortness of Breath  [  ]  Wheezing GI: [ x ] Heartburn  [  ] Nausea or Vomiting  [x  ] chronic Diarrhea [  ] Constipation  [  ] Abdominal Pain GU: [ x ]  Polyuria  [  ]  nocturia Bones/joints:  [  ]  Muscle aches  [x  ] Joint Pain  [  ] Bone pain Skin/Hair/Nails: [ x ]  Rash  [  ] New stretch marks [  ]  Itching [ x ] Hair loss [  ]  Excessive hair growth Reproduction: [  ] Low sexual desire , [  ]  Women: Menstrual cycle problems [  ]  Women: Breast Discharge [  ] Men: Difficulty with erections [  ]  Men: Enlarged Breasts CNS: [  ] Frequent Headaches [  ] Blurry vision [  ] Tremors [  ] Seizures [  ] Loss of  consciousness [  ] Localized weakness Endocrine: [  ]  Excess thirst [  ]  Feeling excessively hot [  ]  Feeling excessively cold Heme: [ x ]  Easy bruising [  ]  Enlarged glands or lumps in neck Allergy: [  ]  Food allergies [  ] Environmental allergies  PE: BP 144/86 mmHg  Pulse 89  Wt 268 lb 4 oz (121.677 kg)  SpO2 98% Wt Readings from Last 3 Encounters:  05/03/14 268 lb 4 oz (121.677 kg)  04/04/14 262 lb 12 oz (119.183 kg)  03/07/14 264 lb 4 oz (119.863 kg)    HEENT: [place]/AT, EOMI, no icterus, no proptosis, no chemosis, no mild lid lag, no retraction, eyes close completely Neck: thyroid gland - not palpable; negative Pemberton's sign; no lymphadenopathy Lungs: good air entry, clear bilaterally Heart: S1&S2 normal, regular rate & rhythm; no murmurs, rubs or gallops Abd: soft, NT, ND, no HSM, +BS, obesity Ext: min tremor in hands bilaterally, no edema, 2+ DP/PT pulses, good muscle mass Neuro: normal gait, 2+ reflexes bilaterally, normal 5/5 strength, no proximal myopathy  Derm: no pretibial myxoedema/skin dryness   ASSESSMENT: 1. Hypothyroidism, uncontrolled   Problem List Items Addressed This Visit      Endocrine   Hypothyroidism - Primary    Last TSH was elevated about 4 weeks ago. Will reassess her thyroid function today.  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   She appears to be taking her Synthroid correctly and not missed any doses except today.   If TSH still elevated, then will likely increase dose of Synthroid to 237 mcg daily.   RTC 6 weeks.     Relevant Orders      TSH      T4, free       -RTC in 6 weeks  Makenzee Choudhry [facility]  05/03/2014   1:46 PM \

## 2014-05-03 NOTE — Progress Notes (Signed)
Pre visit review using our clinic review tool, if applicable. No additional management support is needed unless otherwise documented below in the visit note. 

## 2014-05-04 ENCOUNTER — Other Ambulatory Visit: Payer: Self-pay | Admitting: Endocrinology

## 2014-05-04 MED ORDER — SYNTHROID 100 MCG PO TABS: 100.0000 ug | ORAL_TABLET | Freq: Every day | ORAL | Status: DC

## 2014-05-04 MED ORDER — SYNTHROID 137 MCG PO TABS: 137.0000 ug | ORAL_TABLET | Freq: Every day | ORAL | Status: DC

## 2014-05-27 DIAGNOSIS — Z86010 Personal history of colonic polyps: Secondary | ICD-10-CM | POA: Insufficient documentation

## 2014-05-27 DIAGNOSIS — K2270 Barrett's esophagus without dysplasia: Secondary | ICD-10-CM | POA: Insufficient documentation

## 2014-06-01 ENCOUNTER — Ambulatory Visit (INDEPENDENT_AMBULATORY_CARE_PROVIDER_SITE_OTHER): Payer: No Typology Code available for payment source | Admitting: Endocrinology

## 2014-06-01 ENCOUNTER — Encounter: Payer: Self-pay | Admitting: Endocrinology

## 2014-06-01 VITALS — BP 138/84 | HR 78 | Ht 63.0 in | Wt 264.2 lb

## 2014-06-01 DIAGNOSIS — E890 Postprocedural hypothyroidism: Secondary | ICD-10-CM

## 2014-06-01 DIAGNOSIS — Z23 Encounter for immunization: Secondary | ICD-10-CM

## 2014-06-01 LAB — TSH: TSH: 1.39 u[IU]/mL (ref 0.35–4.50)

## 2014-06-01 LAB — T4, FREE: FREE T4: 1.65 ng/dL — AB (ref 0.60–1.60)

## 2014-06-01 NOTE — Progress Notes (Signed)
Patient ID: Paula Garrett, female   DOB: [birth year], 48 y.o.   MRN: [id number]   Reason for visit: Hypothyroidism  HPI  Paula Garrett is a 48 y.o.-year-old female, for follow up for hypothyroidism. Last seen by me ~4 weeks ago.   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 She was tried on generic levothyroxine and took it for about 6 months at that time. This medication "didnt work" and she has been on brand Synthroid since then.  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   Dose of Synthroid changed ~4 weeks ago to 237 mcg daily from 225 mcg daily.   Patient has been taking this medication fasting, with water, separate from breakfast > 30 minutes and from pills ( calcium, iron, PPIs, multivitamins)> 4 hours. Denies missing any doses recently. Since last month is working night and day shifts intermittently.    I reviewed pt's thyroid tests: Lab Results  Component Value Date   TSH 10.40* 05/03/2014   TSH 10.09* 04/04/2014   TSH 7.78* 01/28/2014   TSH 3.53 08/05/2013   TSH 9.82* 06/30/2013   FREET4 0.81 05/03/2014   FREET4 0.95 04/04/2014   FREET4 1.01 01/28/2014   FREET4 1.00 08/05/2013   FREET4 1.02 06/30/2013    Interval ROS-  Review of systems: [ x  ] complains of    [  ] denies [ x ] weight gain, slow and progressive of close to 48 lbs in past 2 years- now weight loss 4 lbs past month  [  ] constipation  [ x ] fatigue, chronic since thyroid surgery - slight improvement recently  [  ] dry skin  [  ] cold intolerance [  ] hair loss [  ] noticing any enlargement in size of thyroid [  ] lumps in neck [  ] dysphagia [  ] change in voice [  ]  SOB with lying down.  No Family  Or personal history of thyroid cancer.  No history of XRT to head or neck.   I reviewed her chart and she also has a history of  anxiety/depression and morbid obesity. She has tried phentermine this year with little success ( treated by Dr walker) - took it for 2 months and has been off this since Oct 2015.   Current Outpatient Prescriptions on File Prior to Visit  Medication Sig Dispense Refill  . cetirizine (ZYRTEC) 10 MG tablet Take 10 mg by mouth daily.    . clonazePAM (KLONOPIN) 1 MG tablet Take 1 tablet (1 mg total) by mouth 2 (two) times daily. 60 tablet 3  . CYANOCOBALAMIN IJ Inject 1,000 mg as directed every 30 (thirty) days.    Marland Kitchen lisinopril (PRINIVIL,ZESTRIL) 10 MG tablet Take 1 tablet (10 mg total) by mouth daily. 90 tablet 3  . pantoprazole (PROTONIX) 40 MG tablet Take 40 mg by mouth daily.    . sertraline (ZOLOFT) 100 MG tablet Take 200 mg by mouth daily.     Marland Kitchen SYNTHROID 100 MCG tablet Take 1 tablet (100 mcg total) by mouth daily before breakfast. Take together with 137 mcg for TDD 237 mcg daily. DAW.code1 30 tablet 3  . SYNTHROID 137 MCG tablet Take 1 tablet (137 mcg total) by mouth daily before breakfast. Take together with Synthroid 100 mcg for TDD 237 mcg daily, DAW code1 30 tablet 3  .  Vitamin D, Ergocalciferol, (DRISDOL) 50000 UNITS CAPS capsule Take 1 capsule (50,000 Units total) by mouth every 7 (seven) days. 30 capsule 0   No current facility-administered medications on file prior to visit.   No Known Allergies      PE: BP 138/84 mmHg  Pulse 78  Ht 5\' 3"  (1.6 m)  Wt 264 lb 4 oz (119.863 kg)  BMI 46.82 kg/m2  SpO2 96% Wt Readings from Last 3 Encounters:  06/01/14 264 lb 4 oz (119.863 kg)  05/03/14 268 lb 4 oz (121.677 kg)  04/04/14 262 lb 12 oz (119.183 kg)    HEENT: [place]/AT, EOMI, no icterus, no proptosis, no chemosis, no mild lid lag, no retraction, eyes close completely Neck: thyroid gland - not palpable; negative Pemberton's sign; no lymphadenopathy Lungs: good air entry, clear bilaterally Heart: S1&S2 normal, regular rate & rhythm; no murmurs, rubs or gallops Ext: no tremor in  hands bilaterally, no edema, 2+ DP/PT pulses, good muscle mass Neuro: normal gait, 2+ reflexes bilaterally, normal 5/5 strength, no proximal myopathy  Derm: no pretibial myxoedema/skin dryness   ASSESSMENT: 1. Hypothyroidism, uncontrolled   Problem List Items Addressed This Visit      Endocrine   Hypothyroidism    Last TSH was elevated about 4 weeks ago. 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   She appears to be taking her Synthroid correctly and not missed any doses.  Dose increased about 4 weeks ago- check thyroid levels and adjust dose of Synthroid based on labs from today.    RTC 6 weeks.       Relevant Orders      TSH      T4, free    Other Visit Diagnoses    Need for influenza vaccination    -  Primary    Relevant Orders       Flu Vaccine QUAD 36+ mos PF IM (Fluarix Quad PF) (Completed)       Annual flu shot given to patient today in clinic.  -RTC in 2 months  Tremar Wickens [facility]  06/01/2014   4:18 PM \

## 2014-06-01 NOTE — Progress Notes (Signed)
Pre visit review using our clinic review tool, if applicable. No additional management support is needed unless otherwise documented below in the visit note. 

## 2014-06-01 NOTE — Assessment & Plan Note (Addendum)
Last TSH was elevated about 4 weeks ago. 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   She appears to be taking her Synthroid correctly and not missed any doses.  Dose increased about 4 weeks ago- check thyroid levels and adjust dose of Synthroid based on labs from today.   RTC 2 months

## 2014-06-01 NOTE — Patient Instructions (Signed)
Labs today.  Continue current synthroid for now. Adjust dose based on labs.   Please come back for a follow-up appointment in 2 months.

## 2014-06-02 ENCOUNTER — Other Ambulatory Visit: Payer: Self-pay | Admitting: Endocrinology

## 2014-06-02 DIAGNOSIS — E890 Postprocedural hypothyroidism: Secondary | ICD-10-CM

## 2014-06-23 ENCOUNTER — Encounter: Payer: Self-pay | Admitting: Internal Medicine

## 2014-06-23 ENCOUNTER — Ambulatory Visit (INDEPENDENT_AMBULATORY_CARE_PROVIDER_SITE_OTHER): Payer: No Typology Code available for payment source | Admitting: Internal Medicine

## 2014-06-23 VITALS — BP 138/84 | HR 87 | Temp 97.4°F | Ht 63.0 in | Wt 259.5 lb

## 2014-06-23 DIAGNOSIS — F329 Major depressive disorder, single episode, unspecified: Secondary | ICD-10-CM

## 2014-06-23 DIAGNOSIS — I10 Essential (primary) hypertension: Secondary | ICD-10-CM

## 2014-06-23 DIAGNOSIS — F418 Other specified anxiety disorders: Secondary | ICD-10-CM

## 2014-06-23 DIAGNOSIS — J320 Chronic maxillary sinusitis: Secondary | ICD-10-CM

## 2014-06-23 DIAGNOSIS — E890 Postprocedural hypothyroidism: Secondary | ICD-10-CM

## 2014-06-23 DIAGNOSIS — R5382 Chronic fatigue, unspecified: Secondary | ICD-10-CM

## 2014-06-23 DIAGNOSIS — F419 Anxiety disorder, unspecified: Principal | ICD-10-CM

## 2014-06-23 DIAGNOSIS — R519 Headache, unspecified: Secondary | ICD-10-CM

## 2014-06-23 DIAGNOSIS — E538 Deficiency of other specified B group vitamins: Secondary | ICD-10-CM

## 2014-06-23 DIAGNOSIS — R51 Headache: Secondary | ICD-10-CM

## 2014-06-23 MED ORDER — CLONAZEPAM 1 MG PO TABS: 1.0000 mg | ORAL_TABLET | Freq: Two times a day (BID) | ORAL | Status: DC

## 2014-06-23 MED ORDER — AMOXICILLIN-POT CLAVULANATE 875-125 MG PO TABS: 1.0000 | ORAL_TABLET | Freq: Two times a day (BID) | ORAL | Status: DC

## 2014-06-23 MED ORDER — CYANOCOBALAMIN 1000 MCG/ML IJ SOLN
1000.0000 ug | Freq: Once | INTRAMUSCULAR | Status: AC
  Administered 2014-06-23: 1000 ug via INTRAMUSCULAR

## 2014-06-23 MED ORDER — SERTRALINE HCL 100 MG PO TABS: 200.0000 mg | ORAL_TABLET | Freq: Every day | ORAL | Status: DC

## 2014-06-23 MED ORDER — PREDNISONE 10 MG PO TABS: ORAL_TABLET | ORAL | Status: DC

## 2014-06-23 NOTE — Patient Instructions (Addendum)
Start Prednisone taper and Augmentin for chronic sinus infection.  We will set up sleep study.  Follow up in 4 weeks for recheck.

## 2014-06-23 NOTE — Assessment & Plan Note (Signed)
Chronic fatigue, recently worsened. Suspect sleep apnea playing a role. Will set up sleep study. Will check CBC, CMP, TSH with labs.

## 2014-06-23 NOTE — Assessment & Plan Note (Signed)
BP Readings from Last 3 Encounters:  06/23/14 138/84  06/01/14 138/84  05/03/14 144/86   BP well controlled on current medication.

## 2014-06-23 NOTE — Progress Notes (Signed)
Pre visit review using our clinic review tool, if applicable. No additional management support is needed unless otherwise documented below in the visit note. 

## 2014-06-23 NOTE — Assessment & Plan Note (Signed)
Chronic recurrent sinus infections after previous sinus surgery. Will start Prednisone taper and Augmentin to see if any improvement in current headaches and sinus pressure. Follow up 4 weeks and prn.

## 2014-06-23 NOTE — Assessment & Plan Note (Signed)
Symptoms well controlled on current medications. Will continue. 

## 2014-06-23 NOTE — Assessment & Plan Note (Signed)
Wt Readings from Last 3 Encounters:  06/23/14 259 lb 8 oz (117.708 kg)  06/01/14 264 lb 4 oz (119.863 kg)  05/03/14 268 lb 4 oz (121.677 kg)   Congratulated pt on weight loss. Encouraged continued effort at healthy diet and exercise.

## 2014-06-23 NOTE — Assessment & Plan Note (Signed)
Recent headache likely related to chronic sinus infections. Will treat sinusitis. Will also set up sleep study to see if sleep apnea may be contributing.

## 2014-06-23 NOTE — Addendum Note (Signed)
Addended by: Leeanne Rio on: 06/23/2014 08:15 AM   Modules accepted: Orders

## 2014-06-23 NOTE — Progress Notes (Signed)
Subjective:    Patient ID: Paula Garrett, female    DOB: [birth year], 49 y.o.   MRN: [id number]  HPI 49YO female presents for follow up.  Headaches - wakes every morning over last few months with diffuse headache. Applies hot rag to head with some improvement. No fever. Notes some nasal congestion and sinus drainage. No consistent nausea or vomiting. Feels fatigued in general.   Wt Readings from Last 3 Encounters:  06/23/14 259 lb 8 oz (117.708 kg)  06/01/14 264 lb 4 oz (119.863 kg)  05/03/14 268 lb 4 oz (121.677 kg)     Past medical, surgical, family and social history per today's encounter.  Review of Systems  Constitutional: Negative for fever, chills, appetite change, fatigue and unexpected weight change.  Eyes: Negative for visual disturbance.  Respiratory: Negative for shortness of breath.   Cardiovascular: Negative for chest pain and leg swelling.  Gastrointestinal: Negative for nausea, vomiting, abdominal pain, diarrhea and constipation.  Musculoskeletal: Negative for myalgias and arthralgias.  Skin: Negative for color change and rash.  Neurological: Positive for headaches. Negative for tremors, syncope, weakness and numbness.  Hematological: Negative for adenopathy. Does not bruise/bleed easily.  Psychiatric/Behavioral: Negative for dysphoric mood. The patient is not nervous/anxious.        Objective:    BP 138/84 mmHg  Pulse 87  Temp(Src) 97.4 F (36.3 C) (Oral)  Ht 5\' 3"  (1.6 m)  Wt 259 lb 8 oz (117.708 kg)  BMI 45.98 kg/m2  SpO2 95% Physical Exam  Constitutional: She is oriented to person, place, and time. She appears well-developed and well-nourished. No distress.  HENT:  Head: Normocephalic and atraumatic.  Right Ear: External ear normal.  Left Ear: External ear normal.  Nose: Nose normal.  Mouth/Throat: Oropharynx is clear and moist. No oropharyngeal exudate.  Eyes: Conjunctivae are normal. Pupils are equal, round, and reactive to light. Right eye  exhibits no discharge. Left eye exhibits no discharge. No scleral icterus.  Neck: Normal range of motion. Neck supple. No tracheal deviation present. No thyromegaly present.  Cardiovascular: Normal rate, regular rhythm, normal heart sounds and intact distal pulses.  Exam reveals no gallop and no friction rub.   No murmur heard. Pulmonary/Chest: Effort normal and breath sounds normal. No accessory muscle usage. No tachypnea. No respiratory distress. She has no decreased breath sounds. She has no wheezes. She has no rhonchi. She has no rales. She exhibits no tenderness.  Musculoskeletal: Normal range of motion. She exhibits no edema or tenderness.  Lymphadenopathy:    She has no cervical adenopathy.  Neurological: She is alert and oriented to person, place, and time. No cranial nerve deficit. She exhibits normal muscle tone. Coordination normal.  Skin: Skin is warm and dry. No rash noted. She is not diaphoretic. No erythema. No pallor.  Psychiatric: She has a normal mood and affect. Her behavior is normal. Judgment and thought content normal.          Assessment & Plan:   Problem List Items Addressed This Visit      Unprioritized   Anxiety and depression - Primary    Symptoms well controlled on current medications. Will continue.    Cephalalgia    Recent headache likely related to chronic sinus infections. Will treat sinusitis. Will also set up sleep study to see if sleep apnea may be contributing.    Relevant Medications      sertraline (ZOLOFT) tablet      clonazePAM (KLONOPIN)  tablet   Chronic fatigue  Chronic fatigue, recently worsened. Suspect sleep apnea playing a role. Will set up sleep study. Will check CBC, CMP, TSH with labs.    Relevant Orders      Comprehensive metabolic panel      CBC w/Diff      Ferritin      Vitamin D (25 hydroxy)      Ambulatory referral to Sleep Studies   Chronic maxillary sinusitis    Chronic recurrent sinus infections after previous sinus  surgery. Will start Prednisone taper and Augmentin to see if any improvement in current headaches and sinus pressure. Follow up 4 weeks and prn.    Relevant Medications      AMOXICILLIN-POT CLAVULANATE 875-125 MG PO TABS      predniSONE (DELTASONE) tablet   Essential hypertension, benign    BP Readings from Last 3 Encounters:  06/23/14 138/84  06/01/14 138/84  05/03/14 144/86   BP well controlled on current medication.    Hypothyroidism    Repeat TSH and T4 next week.    Relevant Orders      TSH      T4, free   Severe obesity (BMI >= 40)    Wt Readings from Last 3 Encounters:  06/23/14 259 lb 8 oz (117.708 kg)  06/01/14 264 lb 4 oz (119.863 kg)  05/03/14 268 lb 4 oz (121.677 kg)   Congratulated pt on weight loss. Encouraged continued effort at healthy diet and exercise.        Return in about 4 weeks (around 07/21/2014) for Recheck.

## 2014-06-23 NOTE — Assessment & Plan Note (Signed)
Repeat TSH and T4 next week.

## 2014-06-24 ENCOUNTER — Other Ambulatory Visit: Payer: Self-pay | Admitting: Internal Medicine

## 2014-06-24 ENCOUNTER — Other Ambulatory Visit (INDEPENDENT_AMBULATORY_CARE_PROVIDER_SITE_OTHER): Payer: No Typology Code available for payment source

## 2014-06-24 ENCOUNTER — Telehealth: Payer: Self-pay | Admitting: Internal Medicine

## 2014-06-24 DIAGNOSIS — E890 Postprocedural hypothyroidism: Secondary | ICD-10-CM

## 2014-06-24 DIAGNOSIS — R5382 Chronic fatigue, unspecified: Secondary | ICD-10-CM

## 2014-06-24 LAB — COMPREHENSIVE METABOLIC PANEL
ALT: 17 U/L (ref 0–35)
AST: 18 U/L (ref 0–37)
Albumin: 3.5 g/dL (ref 3.5–5.2)
Alkaline Phosphatase: 53 U/L (ref 39–117)
BUN: 16 mg/dL (ref 6–23)
CO2: 24 mEq/L (ref 19–32)
Calcium: 9.4 mg/dL (ref 8.4–10.5)
Chloride: 103 mEq/L (ref 96–112)
Creatinine, Ser: 1.1 mg/dL (ref 0.4–1.2)
GFR: 55.12 mL/min — ABNORMAL LOW (ref >60.00)
GLUCOSE: 125 mg/dL — AB (ref 70–99)
POTASSIUM: 3.9 meq/L (ref 3.5–5.1)
SODIUM: 137 meq/L (ref 135–145)
TOTAL PROTEIN: 7.8 g/dL (ref 6.0–8.3)
Total Bilirubin: 0.4 mg/dL (ref 0.2–1.2)

## 2014-06-24 LAB — FERRITIN: Ferritin: 16.8 ng/mL (ref 10.0–291.0)

## 2014-06-24 LAB — CBC WITH DIFFERENTIAL/PLATELET
BASOS ABS: 0 10*3/uL (ref 0.0–0.1)
Basophils Relative: 0.2 % (ref 0.0–3.0)
EOS PCT: 0.2 % (ref 0.0–5.0)
Eosinophils Absolute: 0 10*3/uL (ref 0.0–0.7)
HCT: 40.3 % (ref 36.0–46.0)
HEMOGLOBIN: 13.2 g/dL (ref 12.0–15.0)
LYMPHS ABS: 2.7 10*3/uL (ref 0.7–4.0)
Lymphocytes Relative: 23.1 % (ref 12.0–46.0)
MCHC: 32.8 g/dL (ref 30.0–36.0)
MCV: 90.6 fl (ref 78.0–100.0)
MONO ABS: 0.8 10*3/uL (ref 0.1–1.0)
Monocytes Relative: 6.7 % (ref 3.0–12.0)
NEUTROS ABS: 8 10*3/uL — AB (ref 1.4–7.7)
NEUTROS PCT: 69.8 % (ref 43.0–77.0)
PLATELETS: 282 10*3/uL (ref 150.0–400.0)
RBC: 4.45 Mil/uL (ref 3.87–5.11)
RDW: 14.8 % (ref 11.5–15.5)
WBC: 11.5 10*3/uL — ABNORMAL HIGH (ref 4.0–10.5)

## 2014-06-24 LAB — VITAMIN D 25 HYDROXY (VIT D DEFICIENCY, FRACTURES): VITD: 16.8 ng/mL — ABNORMAL LOW (ref 30.00–100.00)

## 2014-06-24 LAB — T4, FREE: Free T4: 0.92 ng/dL (ref 0.60–1.60)

## 2014-06-24 LAB — TSH: TSH: 9.48 u[IU]/mL — AB (ref 0.35–4.50)

## 2014-06-24 MED ORDER — VITAMIN D (ERGOCALCIFEROL) 1.25 MG (50000 UNIT) PO CAPS: 50000.0000 [IU] | ORAL_CAPSULE | ORAL | Status: DC

## 2014-06-24 NOTE — Telephone Encounter (Signed)
emmi emailed °

## 2014-06-27 ENCOUNTER — Other Ambulatory Visit (INDEPENDENT_AMBULATORY_CARE_PROVIDER_SITE_OTHER): Payer: No Typology Code available for payment source

## 2014-06-27 ENCOUNTER — Telehealth: Payer: Self-pay | Admitting: *Deleted

## 2014-06-27 DIAGNOSIS — N179 Acute kidney failure, unspecified: Secondary | ICD-10-CM

## 2014-06-27 LAB — BASIC METABOLIC PANEL
BUN: 16 mg/dL (ref 6–23)
CO2: 28 mEq/L (ref 19–32)
Calcium: 9.4 mg/dL (ref 8.4–10.5)
Chloride: 101 mEq/L (ref 96–112)
Creatinine, Ser: 0.7 mg/dL (ref 0.4–1.2)
GFR: 88.01 mL/min (ref >60.00)
Glucose, Bld: 89 mg/dL (ref 70–99)
POTASSIUM: 4.1 meq/L (ref 3.5–5.1)
Sodium: 137 mEq/L (ref 135–145)

## 2014-06-27 NOTE — Telephone Encounter (Signed)
Was it just for a bmp?

## 2014-06-27 NOTE — Telephone Encounter (Signed)
What labs and dx?  

## 2014-06-27 NOTE — Telephone Encounter (Signed)
Yes, for acute renal failure

## 2014-07-22 ENCOUNTER — Ambulatory Visit (INDEPENDENT_AMBULATORY_CARE_PROVIDER_SITE_OTHER): Payer: No Typology Code available for payment source | Admitting: Internal Medicine

## 2014-07-22 ENCOUNTER — Encounter: Payer: Self-pay | Admitting: Internal Medicine

## 2014-07-22 VITALS — BP 116/79 | HR 74 | Temp 97.5°F | Ht 63.0 in | Wt 264.5 lb

## 2014-07-22 DIAGNOSIS — F418 Other specified anxiety disorders: Secondary | ICD-10-CM

## 2014-07-22 DIAGNOSIS — R519 Headache, unspecified: Secondary | ICD-10-CM

## 2014-07-22 DIAGNOSIS — R5382 Chronic fatigue, unspecified: Secondary | ICD-10-CM

## 2014-07-22 DIAGNOSIS — R51 Headache: Secondary | ICD-10-CM

## 2014-07-22 DIAGNOSIS — F419 Anxiety disorder, unspecified: Secondary | ICD-10-CM

## 2014-07-22 DIAGNOSIS — E559 Vitamin D deficiency, unspecified: Secondary | ICD-10-CM

## 2014-07-22 DIAGNOSIS — E039 Hypothyroidism, unspecified: Secondary | ICD-10-CM

## 2014-07-22 DIAGNOSIS — E538 Deficiency of other specified B group vitamins: Secondary | ICD-10-CM

## 2014-07-22 DIAGNOSIS — F329 Major depressive disorder, single episode, unspecified: Secondary | ICD-10-CM

## 2014-07-22 DIAGNOSIS — I10 Essential (primary) hypertension: Secondary | ICD-10-CM

## 2014-07-22 MED ORDER — CYANOCOBALAMIN 1000 MCG/ML IJ SOLN
1000.0000 ug | Freq: Once | INTRAMUSCULAR | Status: AC
  Administered 2014-07-22: 1000 ug via INTRAMUSCULAR

## 2014-07-22 MED ORDER — PANTOPRAZOLE SODIUM 40 MG PO TBEC: 40.0000 mg | DELAYED_RELEASE_TABLET | Freq: Every day | ORAL | Status: DC

## 2014-07-22 NOTE — Patient Instructions (Addendum)
Try splitting up Sertraline to 100mg  twice daily.  We will schedule a sleep study.  Labs in 3 weeks.

## 2014-07-22 NOTE — Assessment & Plan Note (Signed)
Will plan recheck Vit D in 1 month. Continue Drisdol.

## 2014-07-22 NOTE — Progress Notes (Signed)
Pre visit review using our clinic review tool, if applicable. No additional management support is needed unless otherwise documented below in the visit note. 

## 2014-07-22 NOTE — Progress Notes (Signed)
Subjective:    Patient ID: Paula Garrett, female    DOB: 30-Jan-1966, 49 y.o.   MRN: [id number]  HPI  48YO female presents for follow up.  Last seen 06/23/2014 - Treated for sinus infection, headache. Noted to have fatigue.  Continues to have fatigue.  No focal symptoms. On rotating shifts at work. Has not yet scheduled sleep study, but plans to. Snores at night, wakes self up at night. Sleep frequently interrupted.  Headache has improved after treatment of sinusitis.  Frustrated by persistent weight gain. Compliant with thyroid medication, however recent TSH 9.   Wt Readings from Last 3 Encounters:  07/22/14 264 lb 8 oz (119.976 kg)  06/23/14 259 lb 8 oz (117.708 kg)  06/01/14 264 lb 4 oz (119.863 kg)   Anxiety and depression have been generally well controlled with Sertraline and prn Clonazepam.  Past medical, surgical, family and social history per today's encounter.  Review of Systems  Constitutional: Positive for fatigue. Negative for fever, chills, appetite change and unexpected weight change.  Eyes: Negative for visual disturbance.  Respiratory: Negative for shortness of breath.   Cardiovascular: Negative for chest pain and leg swelling.  Gastrointestinal: Negative for nausea, vomiting, abdominal pain, diarrhea and constipation.  Skin: Negative for color change and rash.  Hematological: Negative for adenopathy. Does not bruise/bleed easily.  Psychiatric/Behavioral: Positive for sleep disturbance and dysphoric mood. The patient is not nervous/anxious.        Objective:    BP 116/79 mmHg  Pulse 74  Temp(Src) 97.5 F (36.4 C) (Oral)  Ht _0  (1.6 m)  Wt 264 lb 8 oz (119.976 kg)  BMI 46.87 kg/m2  SpO2 95% Physical Exam  Constitutional: She is oriented to person, place, and time. She appears well-developed and well-nourished. No distress.  HENT:  Head: Normocephalic and atraumatic.  Right Ear: External ear normal.  Left Ear: External ear normal.  Nose:  Nose normal.  Mouth/Throat: Oropharynx is clear and moist. No oropharyngeal exudate.  Eyes: Conjunctivae are normal. Pupils are equal, round, and reactive to light. Right eye exhibits no discharge. Left eye exhibits no discharge. No scleral icterus.  Neck: Normal range of motion. Neck supple. No tracheal deviation present. No thyromegaly present.  Cardiovascular: Normal rate, regular rhythm, normal heart sounds and intact distal pulses.  Exam reveals no gallop and no friction rub.   No murmur heard. Pulmonary/Chest: Effort normal and breath sounds normal. No respiratory distress. She has no wheezes. She has no rales. She exhibits no tenderness.  Musculoskeletal: Normal range of motion. She exhibits no edema or tenderness.  Lymphadenopathy:    She has no cervical adenopathy.  Neurological: She is alert and oriented to person, place, and time. No cranial nerve deficit. She exhibits normal muscle tone. Coordination normal.  Skin: Skin is warm and dry. No rash noted. She is not diaphoretic. No erythema. No pallor.  Psychiatric: Her speech is normal and behavior is normal. Judgment and thought content normal. Cognition and memory are normal. She exhibits a depressed mood.          Assessment & Plan:   Problem List Items Addressed This Visit      Unprioritized   Anxiety and depression    Symptoms generally well controlled on Sertraline however having some burning in abdomen when takes the medication. Will try dividing to BID.      Cephalalgia    Headache resolved after treatment of sinus infection.      Chronic fatigue  Chronic fatigue, multifactorial. Encouraged her to schedule sleep study and new referral placed. She will follow up with Dr. Howell Rucks regarding low thyroid function on Levothyroxine. Continue Vit D supplementation and B12 supplementation.      Relevant Orders   Ambulatory referral to Sleep Studies   Essential hypertension, benign - Primary    BP Readings from Last 3  Encounters:  07/22/14 116/79  06/23/14 138/84  06/01/14 138/84   BP well controlled on lisinopril. Will continue.      Relevant Orders   Comp Met (CMET)   Hypothyroidism    Lab Results  Component Value Date   TSH 9.48* 06/24/2014   TSH continues to be elevated on Levothyroxine 288mg daily. Suspect issues with absorption. Follow up scheduled with Dr. PHowell Rucksto discuss alternative options.      Vitamin D deficiency    Will plan recheck Vit D in 1 month. Continue Drisdol.      Relevant Orders   Vit D  25 hydroxy (rtn osteoporosis monitoring)    Other Visit Diagnoses    B12 deficiency        Relevant Medications    cyanocobalamin ((VITAMIN B-12)) injection 1,000 mcg (Completed) (Start on 07/22/2014 10:00 AM)        Return in about 4 weeks (around 08/19/2014) for Recheck.

## 2014-07-22 NOTE — Assessment & Plan Note (Signed)
Lab Results  Component Value Date   TSH 9.48* 06/24/2014   TSH continues to be elevated on Levothyroxine 248mcg daily. Suspect issues with absorption. Follow up scheduled with Dr. Howell Rucks to discuss alternative options.

## 2014-07-22 NOTE — Assessment & Plan Note (Signed)
Headache resolved after treatment of sinus infection.

## 2014-07-22 NOTE — Assessment & Plan Note (Signed)
Chronic fatigue, multifactorial. Encouraged her to schedule sleep study and new referral placed. She will follow up with Dr. Howell Rucks regarding low thyroid function on Levothyroxine. Continue Vit D supplementation and B12 supplementation.

## 2014-07-22 NOTE — Assessment & Plan Note (Signed)
BP Readings from Last 3 Encounters:  07/22/14 116/79  06/23/14 138/84  06/01/14 138/84   BP well controlled on lisinopril. Will continue.

## 2014-07-22 NOTE — Assessment & Plan Note (Signed)
Symptoms generally well controlled on Sertraline however having some burning in abdomen when takes the medication. Will try dividing to BID.

## 2014-08-03 ENCOUNTER — Encounter: Payer: Self-pay | Admitting: Endocrinology

## 2014-08-03 ENCOUNTER — Ambulatory Visit (INDEPENDENT_AMBULATORY_CARE_PROVIDER_SITE_OTHER): Payer: No Typology Code available for payment source | Admitting: Endocrinology

## 2014-08-03 VITALS — BP 128/82 | HR 82 | Resp 14 | Ht 63.0 in | Wt 265.5 lb

## 2014-08-03 DIAGNOSIS — E039 Hypothyroidism, unspecified: Secondary | ICD-10-CM

## 2014-08-03 LAB — TSH: TSH: 7.53 u[IU]/mL — ABNORMAL HIGH (ref 0.35–4.50)

## 2014-08-03 LAB — T4, FREE: Free T4: 0.91 ng/dL (ref 0.60–1.60)

## 2014-08-03 NOTE — Progress Notes (Signed)
Reason for visit: Hypothyroidism  HPI  Paula Garrett is a 49 y.o.-year-old female, for follow up for hypothyroidism. Last seen by me ~5-6 weeks ago.   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 She was tried on generic levothyroxine and took it for about 6 months at that time. This medication "didnt work" and she has been on brand Synthroid since then.  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   Dose of Synthroid changed Nov 2015 to 237 mcg daily from 225 mcg daily.   Patient has been taking this medication fasting, with water, separate from breakfast > 30 minutes and from pills ( calcium, iron, PPIs, multivitamins)> 4 hours. Denies missing any doses recently. Since last months is working night and day shifts intermittently.    I reviewed pt's thyroid tests: Lab Results  Component Value Date   TSH 9.48* 06/24/2014   TSH 1.39 06/01/2014   TSH 10.40* 05/03/2014   TSH 10.09* 04/04/2014   TSH 7.78* 01/28/2014   TSH 3.53 08/05/2013   TSH 9.82* 06/30/2013   FREET4 0.92 06/24/2014   FREET4 1.65* 06/01/2014   FREET4 0.81 05/03/2014   FREET4 0.95 04/04/2014   FREET4 1.01 01/28/2014   FREET4 1.00 08/05/2013   FREET4 1.02 06/30/2013    Interval ROS-  Review of systems: [ x  ] complains of    [  ] denies [ x ] weight gain, slow and progressive of close to 48 lbs in past 2 years- stable again  [  ] constipation  [ x ] fatigue, chronic since thyroid surgery - slight improvement recently  [x  ] dry skin-stable  [  ] cold intolerance [ x ] hair loss-stable [  ] noticing any enlargement in size of thyroid [  ] lumps in neck [  ] dysphagia [  ] change in voice [  ]  SOB with lying down.  No Family  Or personal history of thyroid cancer.  No history of XRT to head or neck.   I reviewed her chart and she also has a history  of anxiety/depression and morbid obesity. She has tried phentermine this year with little success ( treated by Dr walker) - took it for 2 months and has been off this since Oct 2015.   Current Outpatient Prescriptions on File Prior to Visit  Medication Sig Dispense Refill  . cetirizine (ZYRTEC) 10 MG tablet Take 10 mg by mouth daily.    . clonazePAM (KLONOPIN) 1 MG tablet Take 1 tablet (1 mg total) by mouth 2 (two) times daily. 60 tablet 3  . CYANOCOBALAMIN IJ Inject 1,000 mg as directed every 30 (thirty) days.    Marland Kitchen lisinopril (PRINIVIL,ZESTRIL) 10 MG tablet Take 1 tablet (10 mg total) by mouth daily. 90 tablet 3  . pantoprazole (PROTONIX) 40 MG tablet Take 1 tablet (40 mg total) by mouth daily. 90 tablet 3  . sertraline (ZOLOFT) 100 MG tablet Take 2 tablets (200 mg total) by mouth daily. 60 tablet 3  . SYNTHROID 100 MCG tablet Take 1 tablet (100 mcg total) by mouth daily before breakfast. Take together with 137 mcg for TDD 237 mcg daily. DAW.code1 30 tablet 3  . SYNTHROID 137 MCG tablet Take 1 tablet (137 mcg total) by mouth daily before breakfast. Take together with Synthroid 100 mcg for TDD 237 mcg daily, DAW code1  30 tablet 3  . Vitamin D, Ergocalciferol, (DRISDOL) 50000 UNITS CAPS capsule Take 1 capsule (50,000 Units total) by mouth every 7 (seven) days. 12 capsule 0   No current facility-administered medications on file prior to visit.   No Known Allergies      PE: BP 128/82 mmHg  Pulse 82  Resp 14  Ht 5\' 3"  (1.6 m)  Wt 265 lb 8 oz (120.43 kg)  BMI 47.04 kg/m2  SpO2 97% Wt Readings from Last 3 Encounters:  08/03/14 265 lb 8 oz (120.43 kg)  07/22/14 264 lb 8 oz (119.976 kg)  06/23/14 259 lb 8 oz (117.708 kg)    HEENT: [place]/AT, EOMI, no icterus, no proptosis, no chemosis, no mild lid lag, no retraction, eyes close completely Neck: thyroid gland - not palpable; negative Pemberton's sign; no lymphadenopathy Lungs: good air entry, clear bilaterally Heart: S1&S2 normal, regular  rate & rhythm; no murmurs, rubs or gallops Ext: fine tremor in hands bilaterally, no edema, 2+ DP/PT pulses, good muscle mass Neuro: normal gait, 2+ reflexes bilaterally, normal 5/5 strength, no proximal myopathy  Derm: no pretibial myxoedema/skin dryness   ASSESSMENT: 1. Hypothyroidism, uncontrolled   Problem List Items Addressed This Visit      Endocrine   Hypothyroidism - Primary    Last TSH was elevated about 4 weeks ago. 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 Goal would be to have her in mid normal range for TSH.   She appears to be taking her Synthroid correctly and not missed any doses.  check thyroid levels and adjust dose of Synthroid based on labs from today.   RTC 2 months           Relevant Orders   TSH   T4, free      -RTC in 2 months  Chapel Silverthorn [facility]  08/03/2014   1:52 PM

## 2014-08-03 NOTE — Assessment & Plan Note (Signed)
Last TSH was elevated about 4 weeks ago. 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 Goal would be to have her in mid normal range for TSH.   She appears to be taking her Synthroid correctly and not missed any doses.  check thyroid levels and adjust dose of Synthroid based on labs from today.   RTC 2 months

## 2014-08-03 NOTE — Patient Instructions (Signed)
Check labs today.  Adjust dose based on results.   Please come back for a follow-up appointment in 2 months.

## 2014-08-04 ENCOUNTER — Other Ambulatory Visit: Payer: Self-pay | Admitting: Endocrinology

## 2014-08-04 DIAGNOSIS — E039 Hypothyroidism, unspecified: Secondary | ICD-10-CM

## 2014-08-04 MED ORDER — SYNTHROID 125 MCG PO TABS: 250.0000 ug | ORAL_TABLET | Freq: Every day | ORAL | Status: DC

## 2014-08-24 ENCOUNTER — Other Ambulatory Visit (INDEPENDENT_AMBULATORY_CARE_PROVIDER_SITE_OTHER): Payer: No Typology Code available for payment source

## 2014-08-24 DIAGNOSIS — I10 Essential (primary) hypertension: Secondary | ICD-10-CM

## 2014-08-24 DIAGNOSIS — E559 Vitamin D deficiency, unspecified: Secondary | ICD-10-CM

## 2014-08-24 LAB — COMPREHENSIVE METABOLIC PANEL
ALT: 15 U/L (ref 0–35)
AST: 15 U/L (ref 0–37)
Albumin: 3.6 g/dL (ref 3.5–5.2)
Alkaline Phosphatase: 54 U/L (ref 39–117)
BUN: 18 mg/dL (ref 6–23)
CHLORIDE: 106 meq/L (ref 96–112)
CO2: 28 meq/L (ref 19–32)
CREATININE: 0.78 mg/dL (ref 0.40–1.20)
Calcium: 9.6 mg/dL (ref 8.4–10.5)
GFR: 82.77 mL/min (ref >60.00)
GLUCOSE: 90 mg/dL (ref 70–99)
POTASSIUM: 4.2 meq/L (ref 3.5–5.1)
SODIUM: 137 meq/L (ref 135–145)
TOTAL PROTEIN: 7.8 g/dL (ref 6.0–8.3)
Total Bilirubin: 0.2 mg/dL (ref 0.2–1.2)

## 2014-08-24 LAB — VITAMIN D 25 HYDROXY (VIT D DEFICIENCY, FRACTURES): VITD: 25.56 ng/mL — AB (ref 30.00–100.00)

## 2014-08-26 ENCOUNTER — Ambulatory Visit (INDEPENDENT_AMBULATORY_CARE_PROVIDER_SITE_OTHER): Payer: No Typology Code available for payment source | Admitting: Internal Medicine

## 2014-08-26 ENCOUNTER — Encounter: Payer: Self-pay | Admitting: Internal Medicine

## 2014-08-26 VITALS — BP 128/82 | HR 70 | Resp 14 | Ht 63.0 in | Wt 268.4 lb

## 2014-08-26 DIAGNOSIS — F418 Other specified anxiety disorders: Secondary | ICD-10-CM

## 2014-08-26 DIAGNOSIS — Z0000 Encounter for general adult medical examination without abnormal findings: Secondary | ICD-10-CM

## 2014-08-26 DIAGNOSIS — F329 Major depressive disorder, single episode, unspecified: Secondary | ICD-10-CM

## 2014-08-26 DIAGNOSIS — E538 Deficiency of other specified B group vitamins: Secondary | ICD-10-CM

## 2014-08-26 DIAGNOSIS — I10 Essential (primary) hypertension: Secondary | ICD-10-CM

## 2014-08-26 DIAGNOSIS — E039 Hypothyroidism, unspecified: Secondary | ICD-10-CM

## 2014-08-26 DIAGNOSIS — F419 Anxiety disorder, unspecified: Secondary | ICD-10-CM

## 2014-08-26 MED ORDER — CYANOCOBALAMIN 1000 MCG/ML IJ SOLN
1000.0000 ug | Freq: Once | INTRAMUSCULAR | Status: AC
  Administered 2014-08-26: 1000 ug via INTRAMUSCULAR

## 2014-08-26 NOTE — Assessment & Plan Note (Signed)
Symptomatically improved on 282mcg Synthroid dosing. Will plan to recheck TSH and Free t4 in 09/2013.

## 2014-08-26 NOTE — Assessment & Plan Note (Signed)
Symptoms well controlled on Sertraline and prn Clonazepam. Will continue.

## 2014-08-26 NOTE — Assessment & Plan Note (Signed)
BP Readings from Last 3 Encounters:  08/26/14 128/82  08/03/14 128/82  07/22/14 116/79   BP well controlled. Continue Lisinopril. Renal function with labs in 09/2014.

## 2014-08-26 NOTE — Patient Instructions (Signed)
Follow up for physical in 11/2013.

## 2014-08-26 NOTE — Progress Notes (Signed)
Subjective:    Patient ID: Paula Garrett, female    DOB: 1965-12-27, 49 y.o.   MRN: [id number]  HPI 48YO female presents for follow up.  Last seen 2/5 for hypothyroidism.  Recently dose of Synthroid was increased to 288mcg daily. Notes some improvement in energy level. Compliant with daily dosing. Takes on empty stomach with water.  Worked all night. Notes some fluid retention this morning.  Anxiety/Depression - Symptoms recently well controlled. Does not take Clonazepam every day.  Wt Readings from Last 3 Encounters:  08/26/14 268 lb 6.4 oz (121.745 kg)  08/03/14 265 lb 8 oz (120.43 kg)  07/22/14 264 lb 8 oz (119.976 kg)     Past medical, surgical, family and social history per today's encounter.  Review of Systems  Constitutional: Negative for fever, chills, appetite change, fatigue and unexpected weight change.  Eyes: Negative for visual disturbance.  Respiratory: Negative for shortness of breath.   Cardiovascular: Negative for chest pain and leg swelling.  Gastrointestinal: Negative for nausea, vomiting, abdominal pain, diarrhea and constipation.  Musculoskeletal: Negative for myalgias and arthralgias.  Skin: Negative for color change and rash.  Hematological: Negative for adenopathy. Does not bruise/bleed easily.  Psychiatric/Behavioral: Negative for sleep disturbance and dysphoric mood. The patient is not nervous/anxious.        Objective:    BP 128/82 mmHg  Pulse 70  Resp 14  Ht 5\' 3"  (1.6 m)  Wt 268 lb 6.4 oz (121.745 kg)  BMI 47.56 kg/m2  SpO2 96% Physical Exam  Constitutional: She is oriented to person, place, and time. She appears well-developed and well-nourished. No distress.  HENT:  Head: Normocephalic and atraumatic.  Right Ear: External ear normal.  Left Ear: External ear normal.  Nose: Nose normal.  Mouth/Throat: Oropharynx is clear and moist. No oropharyngeal exudate.  Eyes: Conjunctivae are normal. Pupils are equal, round, and reactive to  light. Right eye exhibits no discharge. Left eye exhibits no discharge. No scleral icterus.  Neck: Normal range of motion. Neck supple. No tracheal deviation present. No thyromegaly present.  Cardiovascular: Normal rate, regular rhythm, normal heart sounds and intact distal pulses.  Exam reveals no gallop and no friction rub.   No murmur heard. Pulmonary/Chest: Effort normal and breath sounds normal. No respiratory distress. She has no wheezes. She has no rales. She exhibits no tenderness.  Musculoskeletal: Normal range of motion. She exhibits no edema or tenderness.  Lymphadenopathy:    She has no cervical adenopathy.  Neurological: She is alert and oriented to person, place, and time. No cranial nerve deficit. She exhibits normal muscle tone. Coordination normal.  Skin: Skin is warm and dry. No rash noted. She is not diaphoretic. No erythema. No pallor.  Psychiatric: She has a normal mood and affect. Her behavior is normal. Judgment and thought content normal.          Assessment & Plan:   Problem List Items Addressed This Visit      Unprioritized   Anxiety and depression    Symptoms well controlled on Sertraline and prn Clonazepam. Will continue.      Essential hypertension, benign    BP Readings from Last 3 Encounters:  08/26/14 128/82  08/03/14 128/82  07/22/14 116/79   BP well controlled. Continue Lisinopril. Renal function with labs in 09/2014.      Hypothyroidism - Primary    Symptomatically improved on 239mcg Synthroid dosing. Will plan to recheck TSH and Free t4 in 09/2013.      Relevant  Orders   TSH   T4, free    Other Visit Diagnoses    Routine general medical examination at a health care facility        Relevant Orders    CBC with Differential/Platelet    Comprehensive metabolic panel    Lipid panel    Microalbumin / creatinine urine ratio    Vit D  25 hydroxy (rtn osteoporosis monitoring)    TSH    Hemoglobin A1c    T4, free        Return in about  3 months (around 11/26/2014) for Physical.

## 2014-08-26 NOTE — Progress Notes (Signed)
Pre visit review using our clinic review tool, if applicable. No additional management support is needed unless otherwise documented below in the visit note. 

## 2014-09-23 ENCOUNTER — Encounter: Payer: Self-pay | Admitting: Internal Medicine

## 2014-09-26 ENCOUNTER — Encounter: Payer: Self-pay | Admitting: Internal Medicine

## 2014-09-28 ENCOUNTER — Other Ambulatory Visit (INDEPENDENT_AMBULATORY_CARE_PROVIDER_SITE_OTHER): Payer: No Typology Code available for payment source

## 2014-09-28 DIAGNOSIS — E039 Hypothyroidism, unspecified: Secondary | ICD-10-CM | POA: Diagnosis not present

## 2014-09-28 DIAGNOSIS — Z0000 Encounter for general adult medical examination without abnormal findings: Secondary | ICD-10-CM

## 2014-09-28 LAB — COMPREHENSIVE METABOLIC PANEL
ALK PHOS: 52 U/L (ref 39–117)
ALT: 13 U/L (ref 0–35)
AST: 13 U/L (ref 0–37)
Albumin: 3.3 g/dL — ABNORMAL LOW (ref 3.5–5.2)
BUN: 15 mg/dL (ref 6–23)
CO2: 29 mEq/L (ref 19–32)
Calcium: 9.6 mg/dL (ref 8.4–10.5)
Chloride: 101 mEq/L (ref 96–112)
Creatinine, Ser: 0.79 mg/dL (ref 0.40–1.20)
GFR: 81.53 mL/min (ref >60.00)
Glucose, Bld: 90 mg/dL (ref 70–99)
Potassium: 4.4 mEq/L (ref 3.5–5.1)
SODIUM: 136 meq/L (ref 135–145)
TOTAL PROTEIN: 7.6 g/dL (ref 6.0–8.3)
Total Bilirubin: 0.3 mg/dL (ref 0.2–1.2)

## 2014-09-28 LAB — CBC WITH DIFFERENTIAL/PLATELET
BASOS PCT: 0.6 % (ref 0.0–3.0)
Basophils Absolute: 0 10*3/uL (ref 0.0–0.1)
EOS ABS: 0 10*3/uL (ref 0.0–0.7)
Eosinophils Relative: 0.6 % (ref 0.0–5.0)
HCT: 37 % (ref 36.0–46.0)
HEMOGLOBIN: 12.7 g/dL (ref 12.0–15.0)
LYMPHS PCT: 37.4 % (ref 12.0–46.0)
Lymphs Abs: 2.6 10*3/uL (ref 0.7–4.0)
MCHC: 34.3 g/dL (ref 30.0–36.0)
MCV: 89.6 fl (ref 78.0–100.0)
Monocytes Absolute: 0.6 10*3/uL (ref 0.1–1.0)
Monocytes Relative: 8.2 % (ref 3.0–12.0)
NEUTROS PCT: 53.2 % (ref 43.0–77.0)
Neutro Abs: 3.7 10*3/uL (ref 1.4–7.7)
Platelets: 233 10*3/uL (ref 150.0–400.0)
RBC: 4.13 Mil/uL (ref 3.87–5.11)
RDW: 14.9 % (ref 11.5–15.5)
WBC: 7 10*3/uL (ref 4.0–10.5)

## 2014-09-28 LAB — TSH: TSH: 7.88 u[IU]/mL — ABNORMAL HIGH (ref 0.35–4.50)

## 2014-09-28 LAB — LIPID PANEL
CHOL/HDL RATIO: 3
CHOLESTEROL: 251 mg/dL — AB (ref 0–200)
HDL: 89.7 mg/dL (ref >39.00)
LDL CALC: 138 mg/dL — AB (ref 0–99)
NonHDL: 161.3
TRIGLYCERIDES: 115 mg/dL (ref 0.0–149.0)
VLDL: 23 mg/dL (ref 0.0–40.0)

## 2014-09-28 LAB — MICROALBUMIN / CREATININE URINE RATIO
CREATININE, U: 109.7 mg/dL
Microalb Creat Ratio: 0.6 mg/g (ref 0.0–30.0)
Microalb, Ur: <0.7 mg/dL (ref 0.0–1.9)

## 2014-09-28 LAB — HEMOGLOBIN A1C: HEMOGLOBIN A1C: 5.8 % (ref 4.6–6.5)

## 2014-09-28 LAB — T4, FREE: FREE T4: 0.87 ng/dL (ref 0.60–1.60)

## 2014-09-28 LAB — VITAMIN D 25 HYDROXY (VIT D DEFICIENCY, FRACTURES): VITD: 24.79 ng/mL — AB (ref 30.00–100.00)

## 2014-09-30 ENCOUNTER — Other Ambulatory Visit: Payer: Self-pay

## 2014-09-30 DIAGNOSIS — E039 Hypothyroidism, unspecified: Secondary | ICD-10-CM

## 2014-09-30 MED ORDER — SYNTHROID 137 MCG PO TABS: 274.0000 ug | ORAL_TABLET | Freq: Every day | ORAL | Status: DC

## 2014-10-04 ENCOUNTER — Ambulatory Visit: Payer: No Typology Code available for payment source | Admitting: Endocrinology

## 2014-10-10 LAB — SURGICAL PATHOLOGY

## 2014-10-28 ENCOUNTER — Telehealth: Payer: Self-pay | Admitting: Internal Medicine

## 2014-10-28 MED ORDER — VITAMIN D (ERGOCALCIFEROL) 1.25 MG (50000 UNIT) PO CAPS: 50000.0000 [IU] | ORAL_CAPSULE | ORAL | Status: DC

## 2014-11-02 ENCOUNTER — Other Ambulatory Visit: Payer: Self-pay | Admitting: *Deleted

## 2014-11-02 MED ORDER — CLONAZEPAM 1 MG PO TABS: 1.0000 mg | ORAL_TABLET | Freq: Two times a day (BID) | ORAL | Status: DC

## 2014-11-02 NOTE — Telephone Encounter (Signed)
Rx printed- signed and faxed to pharmacy.

## 2014-11-02 NOTE — Telephone Encounter (Signed)
Fine to fill. 

## 2014-11-16 ENCOUNTER — Ambulatory Visit (INDEPENDENT_AMBULATORY_CARE_PROVIDER_SITE_OTHER): Payer: No Typology Code available for payment source | Admitting: Endocrinology

## 2014-11-16 ENCOUNTER — Encounter: Payer: Self-pay | Admitting: Endocrinology

## 2014-11-16 VITALS — BP 140/74 | HR 74 | Resp 12 | Ht 63.0 in | Wt 288.8 lb

## 2014-11-16 DIAGNOSIS — E039 Hypothyroidism, unspecified: Secondary | ICD-10-CM | POA: Diagnosis not present

## 2014-11-16 LAB — T4, FREE: FREE T4: 1.1 ng/dL (ref 0.60–1.60)

## 2014-11-16 LAB — TSH: TSH: 2.29 u[IU]/mL (ref 0.35–4.50)

## 2014-11-16 NOTE — Patient Instructions (Signed)
Labs today. Adjust dose based on levels.  Please come back for a follow-up appointment in 6 weeks

## 2014-11-16 NOTE — Progress Notes (Signed)
Pre visit review using our clinic review tool, if applicable. No additional management support is needed unless otherwise documented below in the visit note. 

## 2014-11-16 NOTE — Assessment & Plan Note (Signed)
Last TSH was elevated. 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 Goal would be to have her in mid normal range for TSH.   She appears to be taking her Synthroid correctly and not missed any doses.  check thyroid levels and adjust dose of Synthroid based on labs from today.

## 2014-11-16 NOTE — Progress Notes (Signed)
Patient ID: Paula Garrett, female   DOB: [birth year], 49 y.o.   MRN: [id number]    Reason for visit: Hypothyroidism  HPI  Paula Garrett is a 49 y.o.-year-old female, for follow up for hypothyroidism. Last seen by me Feb 2016.   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 She was tried on generic levothyroxine and took it for about 6 months at that time. This medication "didnt work" and she has been on brand Synthroid since then.  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   Dose of Synthroid changed April 2016 to 274 mcg daily from 250 mcg daily.   Patient has been taking this medication fasting, with water, separate from breakfast > 30 minutes and from pills ( calcium, iron, PPIs, multivitamins)> 4 hours. Denies missing any doses recently. Since last months is working night and day shifts intermittently.    I reviewed pt's thyroid tests: Lab Results  Component Value Date   TSH 7.88* 09/28/2014   TSH 7.53* 08/03/2014   TSH 9.48* 06/24/2014   TSH 1.39 06/01/2014   TSH 10.40* 05/03/2014   TSH 10.09* 04/04/2014   TSH 7.78* 01/28/2014   TSH 3.53 08/05/2013   TSH 9.82* 06/30/2013   FREET4 0.87 09/28/2014   FREET4 0.91 08/03/2014   FREET4 0.92 06/24/2014   FREET4 1.65* 06/01/2014   FREET4 0.81 05/03/2014   FREET4 0.95 04/04/2014   FREET4 1.01 01/28/2014   FREET4 1.00 08/05/2013   FREET4 1.02 06/30/2013    Interval ROS-  Review of systems: [ x  ] complains of    [  ] denies [ x ] weight gain, slow and progressive of close to 48 lbs in past 2 years- up since last time  [  ] constipation  [ x ] fatigue, chronic since thyroid surgery - slight improvement recently stable [ x ] hair loss-stable [  ] noticing any enlargement in size of thyroid [  ] lumps in neck [  ] dysphagia [  ] change in voice [  ]  SOB with lying  down.  No Family  Or personal history of thyroid cancer.  No history of XRT to head or neck.   I reviewed her chart and she also has a history of anxiety/depression and morbid obesity. She has tried phentermine this year with little success ( treated by Dr walker) - took it for 2 months and has been off this since Oct 2015.   Current Outpatient Prescriptions on File Prior to Visit  Medication Sig Dispense Refill  . cetirizine (ZYRTEC) 10 MG tablet Take 10 mg by mouth daily.    . clonazePAM (KLONOPIN) 1 MG tablet Take 1 tablet (1 mg total) by mouth 2 (two) times daily. 60 tablet 0  . CYANOCOBALAMIN IJ Inject 1,000 mg as directed every 30 (thirty) days.    Marland Kitchen lisinopril (PRINIVIL,ZESTRIL) 10 MG tablet Take 1 tablet (10 mg total) by mouth daily. 90 tablet 3  . pantoprazole (PROTONIX) 40 MG tablet Take 1 tablet (40 mg total) by mouth daily. 90 tablet 3  . sertraline (ZOLOFT) 100 MG tablet Take 2 tablets (200 mg total) by mouth daily. 60 tablet 3  . SYNTHROID 137 MCG tablet Take 2 tablets (274 mcg total) by mouth daily before breakfast. 60 tablet 3  . Vitamin D, Ergocalciferol, (DRISDOL) 50000 UNITS CAPS capsule Take 1  capsule (50,000 Units total) by mouth every 7 (seven) days. 12 capsule 0   No current facility-administered medications on file prior to visit.   No Known Allergies      PE: BP 140/74 mmHg  Pulse 74  Resp 12  Ht 5\' 3"  (1.6 m)  Wt 288 lb 12 oz (130.976 kg)  BMI 51.16 kg/m2  SpO2 96% Wt Readings from Last 3 Encounters:  11/16/14 288 lb 12 oz (130.976 kg)  08/26/14 268 lb 6.4 oz (121.745 kg)  08/03/14 265 lb 8 oz (120.43 kg)    HEENT: [place]/AT, EOMI, no icterus, no proptosis, no chemosis, no mild lid lag, no retraction, eyes close completely Neck: thyroid gland - not palpable; negative Pemberton's sign; no lymphadenopathy Lungs: good air entry, clear bilaterally Heart: S1&S2 normal, regular rate & rhythm; no murmurs, rubs or gallops Ext: no tremor in hands bilaterally,  no edema, 2+ DP/PT pulses, good muscle mass Neuro: normal gait, 2+ reflexes bilaterally, normal 5/5 strength, no proximal myopathy  Derm: no pretibial myxoedema/skin dryness   ASSESSMENT: 1. Hypothyroidism, uncontrolled   Problem List Items Addressed This Visit      Endocrine   Hypothyroidism - Primary    Last TSH was elevated. 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 Goal would be to have her in mid normal range for TSH.   She appears to be taking her Synthroid correctly and not missed any doses.  check thyroid levels and adjust dose of Synthroid based on labs from today.              Relevant Orders   TSH   T4, free      -RTC in 6 weeks  [facility]  11/16/2014   12:10 PM

## 2014-11-29 ENCOUNTER — Encounter: Payer: No Typology Code available for payment source | Admitting: Internal Medicine

## 2014-12-01 ENCOUNTER — Ambulatory Visit (INDEPENDENT_AMBULATORY_CARE_PROVIDER_SITE_OTHER): Payer: No Typology Code available for payment source | Admitting: Internal Medicine

## 2014-12-01 ENCOUNTER — Encounter: Payer: Self-pay | Admitting: Internal Medicine

## 2014-12-01 VITALS — BP 142/88 | HR 78 | Temp 98.1°F | Resp 14 | Ht 63.0 in | Wt 269.8 lb

## 2014-12-01 DIAGNOSIS — R300 Dysuria: Secondary | ICD-10-CM

## 2014-12-01 DIAGNOSIS — M545 Low back pain: Secondary | ICD-10-CM

## 2014-12-01 LAB — POCT URINALYSIS DIPSTICK
Bilirubin, UA: NEGATIVE
Blood, UA: NEGATIVE
GLUCOSE UA: NEGATIVE
Ketones, UA: NEGATIVE
Leukocytes, UA: NEGATIVE
NITRITE UA: NEGATIVE
PROTEIN UA: NEGATIVE
Spec Grav, UA: 1.01
Urobilinogen, UA: 0.2
pH, UA: 5.5

## 2014-12-01 MED ORDER — CYCLOBENZAPRINE HCL 5 MG PO TABS: 5.0000 mg | ORAL_TABLET | Freq: Three times a day (TID) | ORAL | Status: DC | PRN

## 2014-12-01 NOTE — Progress Notes (Signed)
   Subjective:    Patient ID: KELBI RENSTROM, female    DOB: Jul 04, 1965, 49 y.o.   MRN: [id number]  HPI  49YO female presents for acute visit.  Low back pain - Aching, throbbing pain across lower back over last several days with pressure in lower pelvis. No dysuria, hematuria. No fever, chills. Took Tylenol and Excedrine with no improvement.   Past medical, surgical, family and social history per today's encounter.  Review of Systems  Constitutional: Negative for fever, chills, appetite change, fatigue and unexpected weight change.  Eyes: Negative for visual disturbance.  Respiratory: Negative for shortness of breath.   Cardiovascular: Negative for chest pain and leg swelling.  Gastrointestinal: Negative for nausea, vomiting, abdominal pain, diarrhea and constipation.  Genitourinary: Positive for pelvic pain. Negative for dysuria, urgency and frequency.  Musculoskeletal: Positive for myalgias, back pain and arthralgias.  Skin: Negative for color change and rash.  Hematological: Negative for adenopathy. Does not bruise/bleed easily.  Psychiatric/Behavioral: Negative for dysphoric mood. The patient is not nervous/anxious.        Objective:    BP 142/88 mmHg  Pulse 78  Temp(Src) 98.1 F (36.7 C) (Oral)  Resp 14  Ht 5\' 3"  (1.6 m)  Wt 269 lb 12.8 oz (122.38 kg)  BMI 47.80 kg/m2  SpO2 97% Physical Exam  Constitutional: She is oriented to person, place, and time. She appears well-developed and well-nourished. No distress.  HENT:  Head: Normocephalic and atraumatic.  Right Ear: External ear normal.  Left Ear: External ear normal.  Nose: Nose normal.  Mouth/Throat: Oropharynx is clear and moist.  Eyes: Conjunctivae are normal. Pupils are equal, round, and reactive to light. Right eye exhibits no discharge. Left eye exhibits no discharge. No scleral icterus.  Neck: Normal range of motion. Neck supple. No tracheal deviation present. No thyromegaly present.  Cardiovascular: Normal  rate, regular rhythm, normal heart sounds and intact distal pulses.  Exam reveals no gallop and no friction rub.   No murmur heard. Pulmonary/Chest: Effort normal and breath sounds normal. No respiratory distress. She has no wheezes. She has no rales. She exhibits no tenderness.  Musculoskeletal: She exhibits no edema.       Lumbar back: She exhibits tenderness and pain. She exhibits normal range of motion and no bony tenderness.  Lymphadenopathy:    She has no cervical adenopathy.  Neurological: She is alert and oriented to person, place, and time. No cranial nerve deficit. She exhibits normal muscle tone. Coordination normal.  Skin: Skin is warm and dry. No rash noted. She is not diaphoretic. No erythema. No pallor.  Psychiatric: She has a normal mood and affect. Her behavior is normal. Judgment and thought content normal.          Assessment & Plan:   Problem List Items Addressed This Visit      Unprioritized   Low back pain - Primary    Low back pain c/w muscular strain. Will start prn Flexeril. Discussed potential side effects of this medication. Follow up prn if symptoms not improving. Discussed potential referral for PT and adding lumbar support at work.       Relevant Medications   cyclobenzaprine (FLEXERIL) 5 MG tablet    Other Visit Diagnoses    Dysuria        Relevant Orders    POCT Urinalysis Dipstick (Completed)        Return if symptoms worsen or fail to improve.

## 2014-12-01 NOTE — Patient Instructions (Signed)
Start Flexeril 5-10mg  every 8 hours for low back pain.  Follow up if symptoms are not improving.

## 2014-12-01 NOTE — Telephone Encounter (Signed)
Pt scheduled for 4/30

## 2014-12-01 NOTE — Progress Notes (Signed)
Pre visit review using our clinic review tool, if applicable. No additional management support is needed unless otherwise documented below in the visit note. 

## 2014-12-01 NOTE — Assessment & Plan Note (Signed)
Low back pain c/w muscular strain. Will start prn Flexeril. Discussed potential side effects of this medication. Follow up prn if symptoms not improving. Discussed potential referral for PT and adding lumbar support at work.

## 2014-12-12 ENCOUNTER — Encounter: Payer: Self-pay | Admitting: Internal Medicine

## 2014-12-12 ENCOUNTER — Ambulatory Visit (INDEPENDENT_AMBULATORY_CARE_PROVIDER_SITE_OTHER): Payer: No Typology Code available for payment source | Admitting: Internal Medicine

## 2014-12-12 VITALS — BP 135/82 | HR 76 | Temp 97.4°F | Ht 63.0 in | Wt 269.5 lb

## 2014-12-12 DIAGNOSIS — F419 Anxiety disorder, unspecified: Principal | ICD-10-CM

## 2014-12-12 DIAGNOSIS — F418 Other specified anxiety disorders: Secondary | ICD-10-CM

## 2014-12-12 DIAGNOSIS — F1190 Opioid use, unspecified, uncomplicated: Secondary | ICD-10-CM | POA: Insufficient documentation

## 2014-12-12 DIAGNOSIS — I10 Essential (primary) hypertension: Secondary | ICD-10-CM | POA: Diagnosis not present

## 2014-12-12 DIAGNOSIS — F329 Major depressive disorder, single episode, unspecified: Secondary | ICD-10-CM

## 2014-12-12 MED ORDER — VENLAFAXINE HCL ER 75 MG PO CP24: 75.0000 mg | ORAL_CAPSULE | Freq: Every day | ORAL | Status: DC

## 2014-12-12 MED ORDER — CLONAZEPAM 1 MG PO TABS: 1.0000 mg | ORAL_TABLET | Freq: Two times a day (BID) | ORAL | Status: DC

## 2014-12-12 NOTE — Progress Notes (Signed)
Subjective:    Patient ID: Paula Garrett, female    DOB: Jul 31, 1965, 49 y.o.   MRN: [id number]  HPI  49YO female presents for follow up.  Having some burning in stomach when taking Sertraline. Not similar to reflux symptoms. She would like to change to alternative medication. No nausea or vomiting. Similar to when she took iron supplements. Lasts for about 45min then improved. Taking pantoprazole.  Recent UDS was positive for oxycodone. She denies taking oxycodone. Reports there may have been Percocet in the bottle of Tylenol she had been using. Requests refill for Clonazepam.   Past medical, surgical, family and social history per today's encounter.  Review of Systems  Constitutional: Negative for fever, chills, appetite change, fatigue and unexpected weight change.  Eyes: Negative for visual disturbance.  Respiratory: Negative for shortness of breath.   Cardiovascular: Negative for chest pain and leg swelling.  Gastrointestinal: Negative for nausea, vomiting, abdominal pain, diarrhea and constipation.  Musculoskeletal: Positive for myalgias, back pain and arthralgias.  Skin: Negative for color change and rash.  Hematological: Negative for adenopathy. Does not bruise/bleed easily.  Psychiatric/Behavioral: Negative for sleep disturbance and dysphoric mood. The patient is not nervous/anxious.        Objective:    BP 135/82 mmHg  Pulse 76  Temp(Src) 97.4 F (36.3 C) (Oral)  Ht 5\' 3"  (1.6 m)  Wt 269 lb 8 oz (122.244 kg)  BMI 47.75 kg/m2  SpO2 96% Physical Exam  Constitutional: She is oriented to person, place, and time. She appears well-developed and well-nourished. No distress.  HENT:  Head: Normocephalic and atraumatic.  Right Ear: External ear normal.  Left Ear: External ear normal.  Nose: Nose normal.  Mouth/Throat: Oropharynx is clear and moist. No oropharyngeal exudate.  Eyes: Conjunctivae are normal. Pupils are equal, round, and reactive to light. Right eye  exhibits no discharge. Left eye exhibits no discharge. No scleral icterus.  Neck: Normal range of motion. Neck supple. No tracheal deviation present. No thyromegaly present.  Cardiovascular: Normal rate, regular rhythm, normal heart sounds and intact distal pulses.  Exam reveals no gallop and no friction rub.   No murmur heard. Pulmonary/Chest: Effort normal and breath sounds normal. No respiratory distress. She has no wheezes. She has no rales. She exhibits no tenderness.  Musculoskeletal: Normal range of motion. She exhibits no edema or tenderness.  Lymphadenopathy:    She has no cervical adenopathy.  Neurological: She is alert and oriented to person, place, and time. No cranial nerve deficit. She exhibits normal muscle tone. Coordination normal.  Skin: Skin is warm and dry. No rash noted. She is not diaphoretic. No erythema. No pallor.  Psychiatric: She has a normal mood and affect. Her speech is normal and behavior is normal. Judgment and thought content normal. She expresses no suicidal ideation.          Assessment & Plan:   Problem List Items Addressed This Visit      Unprioritized   Anxiety and depression - Primary    Chronic anxiety and depression. Unable to tolerate Sertraline because of abdominal pain with medication. Will try change back to Effexor, which she tolerated well in the past. Discussed potential side effects of these medications. Refilled Clonazepam x 1 month. Discussed risks of stopping this medication suddenly. Will set up psychiatry referral for any additional controlled refills, given UDS pos for oxycodone which was not prescribed.      Relevant Medications   venlafaxine XR (EFFEXOR XR) 75 MG 24  hr capsule   Other Relevant Orders   Ambulatory referral to Psychiatry   Essential hypertension, benign    BP Readings from Last 3 Encounters:  12/12/14 135/82  12/01/14 142/88  11/16/14 140/74   BP well controlled. Recent renal function normal. Continue  Lisinopril.      Narcotic drug use    UDS pos for Oxycodone. She is not prescribed this medication. Reports there may have been residue from Percocet in her Tylenol bottle. Discuss risks of narcotic use, particularly in combination with use of benzodiazepine. Will set up psychiatry evaluation for chronic management of anxiety.          Return in about 4 weeks (around 01/09/2015) for Physical.

## 2014-12-12 NOTE — Progress Notes (Signed)
Pre visit review using our clinic review tool, if applicable. No additional management support is needed unless otherwise documented below in the visit note. 

## 2014-12-12 NOTE — Assessment & Plan Note (Signed)
BP Readings from Last 3 Encounters:  12/12/14 135/82  12/01/14 142/88  11/16/14 140/74   BP well controlled. Recent renal function normal. Continue Lisinopril.

## 2014-12-12 NOTE — Assessment & Plan Note (Signed)
UDS pos for Oxycodone. She is not prescribed this medication. Reports there may have been residue from Percocet in her Tylenol bottle. Discuss risks of narcotic use, particularly in combination with use of benzodiazepine. Will set up psychiatry evaluation for chronic management of anxiety.

## 2014-12-12 NOTE — Assessment & Plan Note (Signed)
Chronic anxiety and depression. Unable to tolerate Sertraline because of abdominal pain with medication. Will try change back to Effexor, which she tolerated well in the past. Discussed potential side effects of these medications. Refilled Clonazepam x 1 month. Discussed risks of stopping this medication suddenly. Will set up psychiatry referral for any additional controlled refills, given UDS pos for oxycodone which was not prescribed.

## 2014-12-12 NOTE — Patient Instructions (Addendum)
Stop Sertraline.  Start Effexor 75mg  daily.  Follow up in 4 weeks or sooner as needed.

## 2015-01-03 ENCOUNTER — Ambulatory Visit: Payer: No Typology Code available for payment source | Admitting: Endocrinology

## 2015-01-04 ENCOUNTER — Encounter: Payer: No Typology Code available for payment source | Admitting: Endocrinology

## 2015-01-12 ENCOUNTER — Encounter: Payer: Self-pay | Admitting: Internal Medicine

## 2015-01-12 ENCOUNTER — Ambulatory Visit (INDEPENDENT_AMBULATORY_CARE_PROVIDER_SITE_OTHER): Payer: No Typology Code available for payment source | Admitting: Internal Medicine

## 2015-01-12 VITALS — BP 128/70 | HR 80 | Temp 98.1°F | Ht 63.0 in | Wt 269.0 lb

## 2015-01-12 DIAGNOSIS — F418 Other specified anxiety disorders: Secondary | ICD-10-CM

## 2015-01-12 DIAGNOSIS — F329 Major depressive disorder, single episode, unspecified: Secondary | ICD-10-CM

## 2015-01-12 DIAGNOSIS — F419 Anxiety disorder, unspecified: Secondary | ICD-10-CM

## 2015-01-12 DIAGNOSIS — E039 Hypothyroidism, unspecified: Secondary | ICD-10-CM

## 2015-01-12 DIAGNOSIS — Z0000 Encounter for general adult medical examination without abnormal findings: Secondary | ICD-10-CM | POA: Insufficient documentation

## 2015-01-12 DIAGNOSIS — I10 Essential (primary) hypertension: Secondary | ICD-10-CM

## 2015-01-12 LAB — COMPREHENSIVE METABOLIC PANEL
ALBUMIN: 3.5 g/dL (ref 3.5–5.2)
ALT: 16 U/L (ref 0–35)
AST: 16 U/L (ref 0–37)
Alkaline Phosphatase: 51 U/L (ref 39–117)
BUN: 9 mg/dL (ref 6–23)
CHLORIDE: 102 meq/L (ref 96–112)
CO2: 27 meq/L (ref 19–32)
Calcium: 9.4 mg/dL (ref 8.4–10.5)
Creatinine, Ser: 0.64 mg/dL (ref 0.40–1.20)
GFR: 103.84 mL/min (ref >60.00)
Glucose, Bld: 102 mg/dL — ABNORMAL HIGH (ref 70–99)
POTASSIUM: 4.1 meq/L (ref 3.5–5.1)
Sodium: 137 mEq/L (ref 135–145)
TOTAL PROTEIN: 7.8 g/dL (ref 6.0–8.3)
Total Bilirubin: 0.3 mg/dL (ref 0.2–1.2)

## 2015-01-12 LAB — T4, FREE: FREE T4: 0.93 ng/dL (ref 0.60–1.60)

## 2015-01-12 LAB — TSH: TSH: 5.08 u[IU]/mL — ABNORMAL HIGH (ref 0.35–4.50)

## 2015-01-12 MED ORDER — VENLAFAXINE HCL ER 150 MG PO CP24: 150.0000 mg | ORAL_CAPSULE | Freq: Every day | ORAL | Status: DC

## 2015-01-12 MED ORDER — CLONAZEPAM 1 MG PO TABS: 1.0000 mg | ORAL_TABLET | Freq: Two times a day (BID) | ORAL | Status: DC

## 2015-01-12 NOTE — Assessment & Plan Note (Signed)
Worsening anxiety and depression. Will increase Effexor to 150mg  daily. Referral to psychiatry in process. Encouraged her to limit use of Clonazepam as much as possible. Follow up in 4 weeks.

## 2015-01-12 NOTE — Assessment & Plan Note (Signed)
Wt Readings from Last 3 Encounters:  01/12/15 269 lb (122.018 kg)  12/12/14 269 lb 8 oz (122.244 kg)  12/01/14 269 lb 12.8 oz (122.38 kg)   Encouraged healthy diet and exercise. Will recheck TSH with labs.

## 2015-01-12 NOTE — Assessment & Plan Note (Signed)
Will check TSH with labs. 

## 2015-01-12 NOTE — Assessment & Plan Note (Signed)
BP Readings from Last 3 Encounters:  01/12/15 128/70  12/12/14 135/82  12/01/14 142/88   BP well controlled in clinic, however has been elevated at times between visits. Will continue Lisinopril. Renal function with labs.

## 2015-01-12 NOTE — Patient Instructions (Signed)
Labs today.  Increase Effexor to 150mg  daily.  Follow up in 4 weeks.

## 2015-01-12 NOTE — Progress Notes (Signed)
Pre visit review using our clinic review tool, if applicable. No additional management support is needed unless otherwise documented below in the visit note. 

## 2015-01-12 NOTE — Progress Notes (Signed)
Subjective:    Patient ID: Paula Garrett, female    DOB: 02-09-1966, 49 y.o.   MRN: [id number]  HPI  49YO female presents for follow up.   HTN - BP was elevated earlier this week, 190/100. Felt dizzy at the time. Had not taken medication. Took medication and BP improved. Last night was 150/90s.  Anxiety - Recently worsened with family and work stressors. Has not yet been contacted for psychiatry evaluation. Would like to increase Effexor. Trying to taper down on Clonazepam as much as possible. Skips evening dose intermittently.  Frustrated by lack of ability to lose weight. Has been more active. Not following any specific diet. Compliant with thyroid medication.  Wt Readings from Last 3 Encounters:  01/12/15 269 lb (122.018 kg)  12/12/14 269 lb 8 oz (122.244 kg)  12/01/14 269 lb 12.8 oz (122.38 kg)     Past medical, surgical, family and social history per today's encounter.  Review of Systems  Constitutional: Negative for fever, chills, appetite change, fatigue and unexpected weight change.  Eyes: Negative for visual disturbance.  Respiratory: Negative for shortness of breath.   Cardiovascular: Negative for chest pain and leg swelling.  Gastrointestinal: Negative for nausea, vomiting, abdominal pain, diarrhea and constipation.  Musculoskeletal: Negative for myalgias and arthralgias.  Skin: Negative for color change and rash.  Hematological: Negative for adenopathy. Does not bruise/bleed easily.  Psychiatric/Behavioral: Negative for sleep disturbance and dysphoric mood. The patient is not nervous/anxious.        Objective:    BP 128/70 mmHg  Pulse 80  Temp(Src) 98.1 F (36.7 C) (Oral)  Ht 5\' 3"  (1.6 m)  Wt 269 lb (122.018 kg)  BMI 47.66 kg/m2  SpO2 97% Physical Exam  Constitutional: She is oriented to person, place, and time. She appears well-developed and well-nourished. No distress.  HENT:  Head: Normocephalic and atraumatic.  Right Ear: External ear normal.    Left Ear: External ear normal.  Nose: Nose normal.  Mouth/Throat: Oropharynx is clear and moist. No oropharyngeal exudate.  Eyes: Conjunctivae are normal. Pupils are equal, round, and reactive to light. Right eye exhibits no discharge. Left eye exhibits no discharge. No scleral icterus.  Neck: Normal range of motion. Neck supple. No tracheal deviation present. No thyromegaly present.  Cardiovascular: Normal rate, regular rhythm, normal heart sounds and intact distal pulses.  Exam reveals no gallop and no friction rub.   No murmur heard. Pulmonary/Chest: Effort normal and breath sounds normal. No respiratory distress. She has no wheezes. She has no rales. She exhibits no tenderness.  Musculoskeletal: Normal range of motion. She exhibits no edema or tenderness.  Lymphadenopathy:    She has no cervical adenopathy.  Neurological: She is alert and oriented to person, place, and time. No cranial nerve deficit. She exhibits normal muscle tone. Coordination normal.  Skin: Skin is warm and dry. No rash noted. She is not diaphoretic. No erythema. No pallor.  Psychiatric: Her speech is normal and behavior is normal. Judgment and thought content normal. Her mood appears anxious. Cognition and memory are normal.          Assessment & Plan:   Problem List Items Addressed This Visit      Unprioritized   Anxiety and depression    Worsening anxiety and depression. Will increase Effexor to 150mg  daily. Referral to psychiatry in process. Encouraged her to limit use of Clonazepam as much as possible. Follow up in 4 weeks.      Relevant Medications   venlafaxine  XR (EFFEXOR-XR) 150 MG 24 hr capsule   Essential hypertension, benign    BP Readings from Last 3 Encounters:  01/12/15 128/70  12/12/14 135/82  12/01/14 142/88   BP well controlled in clinic, however has been elevated at times between visits. Will continue Lisinopril. Renal function with labs.      Relevant Orders   Comprehensive  metabolic panel   Hypothyroidism - Primary    Will check TSH with labs.      Relevant Orders   TSH   T4, free   Severe obesity (BMI >= 40)    Wt Readings from Last 3 Encounters:  01/12/15 269 lb (122.018 kg)  12/12/14 269 lb 8 oz (122.244 kg)  12/01/14 269 lb 12.8 oz (122.38 kg)   Encouraged healthy diet and exercise. Will recheck TSH with labs.          Return in about 4 weeks (around 02/09/2015) for Physical.

## 2015-02-07 ENCOUNTER — Other Ambulatory Visit: Payer: Self-pay | Admitting: Internal Medicine

## 2015-02-14 ENCOUNTER — Other Ambulatory Visit: Payer: Self-pay | Admitting: Internal Medicine

## 2015-02-14 ENCOUNTER — Other Ambulatory Visit: Payer: Self-pay | Admitting: *Deleted

## 2015-02-14 MED ORDER — CLONAZEPAM 1 MG PO TABS: 1.0000 mg | ORAL_TABLET | Freq: Two times a day (BID) | ORAL | Status: DC

## 2015-02-23 ENCOUNTER — Ambulatory Visit: Payer: PRIVATE HEALTH INSURANCE | Admitting: Psychiatry

## 2015-03-16 ENCOUNTER — Ambulatory Visit (INDEPENDENT_AMBULATORY_CARE_PROVIDER_SITE_OTHER): Payer: PRIVATE HEALTH INSURANCE | Admitting: Psychiatry

## 2015-03-16 ENCOUNTER — Encounter: Payer: Self-pay | Admitting: Psychiatry

## 2015-03-16 VITALS — BP 144/98 | HR 96 | Temp 97.4°F | Ht 63.0 in | Wt 267.0 lb

## 2015-03-16 DIAGNOSIS — F332 Major depressive disorder, recurrent severe without psychotic features: Secondary | ICD-10-CM

## 2015-03-16 MED ORDER — QUETIAPINE FUMARATE 50 MG PO TABS: ORAL_TABLET | ORAL | Status: DC

## 2015-03-16 MED ORDER — CLONAZEPAM 1 MG PO TABS: 1.0000 mg | ORAL_TABLET | Freq: Two times a day (BID) | ORAL | Status: DC
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
## 2015-03-27 ENCOUNTER — Encounter: Payer: Self-pay | Admitting: Internal Medicine

## 2015-03-27 ENCOUNTER — Ambulatory Visit (INDEPENDENT_AMBULATORY_CARE_PROVIDER_SITE_OTHER): Payer: No Typology Code available for payment source | Admitting: Internal Medicine

## 2015-03-27 ENCOUNTER — Other Ambulatory Visit (HOSPITAL_COMMUNITY)
Admission: RE | Admit: 2015-03-27 | Discharge: 2015-03-27 | Disposition: A | Discharge status: Home / Self Care | Payer: No Typology Code available for payment source | Source: Ambulatory Visit | Attending: Internal Medicine | Admitting: Internal Medicine

## 2015-03-27 VITALS — BP 149/79 | HR 89 | Temp 98.0°F | Ht 63.5 in | Wt 266.4 lb

## 2015-03-27 DIAGNOSIS — R1030 Lower abdominal pain, unspecified: Secondary | ICD-10-CM | POA: Diagnosis not present

## 2015-03-27 DIAGNOSIS — Z0000 Encounter for general adult medical examination without abnormal findings: Secondary | ICD-10-CM

## 2015-03-27 DIAGNOSIS — Z01419 Encounter for gynecological examination (general) (routine) without abnormal findings: Secondary | ICD-10-CM | POA: Insufficient documentation

## 2015-03-27 DIAGNOSIS — R109 Unspecified abdominal pain: Secondary | ICD-10-CM | POA: Insufficient documentation

## 2015-03-27 DIAGNOSIS — Z23 Encounter for immunization: Secondary | ICD-10-CM

## 2015-03-27 DIAGNOSIS — Z1151 Encounter for screening for human papillomavirus (HPV): Secondary | ICD-10-CM | POA: Insufficient documentation

## 2015-03-27 LAB — POCT URINALYSIS DIPSTICK
BILIRUBIN UA: NEGATIVE
Blood, UA: NEGATIVE
GLUCOSE UA: NEGATIVE
Ketones, UA: NEGATIVE
LEUKOCYTES UA: NEGATIVE
NITRITE UA: NEGATIVE
PH UA: 7
Protein, UA: 30
Spec Grav, UA: 1.015
Urobilinogen, UA: 0.2

## 2015-03-27 LAB — MICROALBUMIN / CREATININE URINE RATIO
Creatinine,U: 132.3 mg/dL
MICROALB UR: 1.6 mg/dL (ref 0.0–1.9)
Microalb Creat Ratio: 1.2 mg/g (ref 0.0–30.0)

## 2015-03-27 LAB — CBC WITH DIFFERENTIAL/PLATELET
BASOS PCT: 0.8 % (ref 0.0–3.0)
Basophils Absolute: 0.1 10*3/uL (ref 0.0–0.1)
EOS PCT: 0.3 % (ref 0.0–5.0)
Eosinophils Absolute: 0 10*3/uL (ref 0.0–0.7)
HEMATOCRIT: 37.3 % (ref 36.0–46.0)
HEMOGLOBIN: 12.7 g/dL (ref 12.0–15.0)
Lymphocytes Relative: 23.1 % (ref 12.0–46.0)
Lymphs Abs: 2.4 10*3/uL (ref 0.7–4.0)
MCHC: 34 g/dL (ref 30.0–36.0)
MCV: 89.6 fl (ref 78.0–100.0)
MONO ABS: 0.7 10*3/uL (ref 0.1–1.0)
MONOS PCT: 7.1 % (ref 3.0–12.0)
Neutro Abs: 7.1 10*3/uL (ref 1.4–7.7)
Neutrophils Relative %: 68.7 % (ref 43.0–77.0)
Platelets: 296 10*3/uL (ref 150.0–400.0)
RBC: 4.16 Mil/uL (ref 3.87–5.11)
RDW: 13.9 % (ref 11.5–15.5)
WBC: 10.4 10*3/uL (ref 4.0–10.5)

## 2015-03-27 LAB — COMPREHENSIVE METABOLIC PANEL
ALK PHOS: 57 U/L (ref 39–117)
ALT: 14 U/L (ref 0–35)
AST: 18 U/L (ref 0–37)
Albumin: 3.4 g/dL — ABNORMAL LOW (ref 3.5–5.2)
BUN: 10 mg/dL (ref 6–23)
CO2: 31 meq/L (ref 19–32)
Calcium: 9.9 mg/dL (ref 8.4–10.5)
Chloride: 97 mEq/L (ref 96–112)
Creatinine, Ser: 0.65 mg/dL (ref 0.40–1.20)
GFR: 101.92 mL/min (ref >60.00)
GLUCOSE: 94 mg/dL (ref 70–99)
POTASSIUM: 4.6 meq/L (ref 3.5–5.1)
SODIUM: 136 meq/L (ref 135–145)
TOTAL PROTEIN: 8.2 g/dL (ref 6.0–8.3)
Total Bilirubin: 0.2 mg/dL (ref 0.2–1.2)

## 2015-03-27 LAB — LIPID PANEL
CHOL/HDL RATIO: 3
Cholesterol: 210 mg/dL — ABNORMAL HIGH (ref 0–200)
HDL: 78.5 mg/dL (ref >39.00)
LDL CALC: 102 mg/dL — AB (ref 0–99)
NONHDL: 131.09
Triglycerides: 146 mg/dL (ref 0.0–149.0)
VLDL: 29.2 mg/dL (ref 0.0–40.0)

## 2015-03-27 LAB — VITAMIN D 25 HYDROXY (VIT D DEFICIENCY, FRACTURES): VITD: 22.2 ng/mL — AB (ref 30.00–100.00)

## 2015-03-27 LAB — HM PAP SMEAR

## 2015-03-27 LAB — TSH: TSH: 0.73 u[IU]/mL (ref 0.35–4.50)

## 2015-03-27 NOTE — Assessment & Plan Note (Addendum)
General medical exam normal today including breast and pelvic exam. PAP pending. Mammogram ordered. Colonoscopy UTD. Labs as ordered. Flu vaccine today. Encouraged healthy diet and exercise. Encourage smoking cessation.

## 2015-03-27 NOTE — Assessment & Plan Note (Signed)
Bilateral abdominal pain. Exam normal including pelvic exam. Will check CMP, CBC, urinalysis. Unclear etiology. Suspect increased bowel gas may be playing a role. Pt will call if symptoms are not improving.

## 2015-03-27 NOTE — Progress Notes (Signed)
Subjective:    Patient ID: Paula Garrett, female    DOB: 1965/12/10, 49 y.o.   MRN: [id number]  HPI  49YO female presents for physical exam.  Abdominal pain - Aching pressure x 5 days. Bilateral lower abdomen and lower back. No urinary symptoms. No fever, chills.  Treated for staph infection on face with Cipro a few weeks ago. Not MRSA per her report. No NVD. Not taking anything for pain.  Aside from this, feeling well. Completed Colonoscopy last year at [facility].  Wt Readings from Last 3 Encounters:  03/27/15 266 lb 6 oz (120.827 kg)  03/16/15 267 lb (121.11 kg)  01/12/15 269 lb (122.018 kg)   BP Readings from Last 3 Encounters:  03/27/15 149/79  03/16/15 144/98  01/12/15 128/70    Past Medical History  Diagnosis Date  . Depression   . Arthritis   . GERD (gastroesophageal reflux disease)   . Thyroid disease   . Hypertension   . Anxiety   . Asthma    Family History  Problem Relation Age of Onset  . Heart disease Mother   . COPD Mother   . Arthritis Mother   . Cancer Mother     uterine  . Lupus Mother   . Anxiety disorder Mother   . Depression Mother   . Drug abuse Mother   . COPD Father   . Arthritis Father   . Heart disease Father   . Heart attack Father   . Alcohol abuse Father   . Diabetes Maternal Grandmother   . Diabetes Maternal Grandfather   . Diabetes Paternal Grandmother   . Diabetes Paternal Grandfather   . Heart disease Brother   . Heart attack Brother   . Drug abuse Brother   . Anxiety disorder Brother   . Depression Brother   . Heart disease Brother    Past Surgical History  Procedure Laterality Date  . Gastric bypass  2005  . Total thyroidectomy    . Tubal ligation    . Breast surgery      cyst aspiration  . Nasal sinus surgery    . Breast surgery  1998    cyst aspiration  . Ablation  2006  . Hernia repair    . Endometrial ablation     Social History   Social History  . Marital Status: Married    Spouse Name: N/A    . Number of Children: N/A  . Years of Education: N/A   Social History Main Topics  . Smoking status: Current Every Day Smoker -- 1.00 packs/day for 30 years    Types: Cigarettes    Start date: 03/15/1980  . Smokeless tobacco: Never Used  . Alcohol Use: 13.2 oz/week    2 Standard drinks or equivalent, 20 Glasses of wine, 0 Cans of beer, 0 Shots of liquor per week  . Drug Use: No  . Sexual Activity:    Partners: Male   Other Topics Concern  . None   Social History Narrative   Lives in [place] with husband.      Work - [employer]      Diet - healthy diet   Exercise - limited   Caffeine use: daily    Review of Systems  Constitutional: Negative for fever, chills, appetite change, fatigue and unexpected weight change.  Eyes: Negative for visual disturbance.  Respiratory: Negative for shortness of breath.   Cardiovascular: Negative for chest pain and leg swelling.  Gastrointestinal: Positive for abdominal pain.  Negative for nausea, vomiting, diarrhea and constipation.  Genitourinary: Positive for pelvic pain. Negative for dysuria, urgency, frequency, vaginal bleeding, vaginal discharge and vaginal pain.  Musculoskeletal: Negative for myalgias and arthralgias.  Skin: Negative for color change and rash.  Hematological: Negative for adenopathy. Does not bruise/bleed easily.  Psychiatric/Behavioral: Negative for sleep disturbance and dysphoric mood. The patient is not nervous/anxious.        Objective:    BP 149/79 mmHg  Pulse 89  Temp(Src) 98 F (36.7 C) (Oral)  Ht 5' 3.5" (1.613 m)  Wt 266 lb 6 oz (120.827 kg)  BMI 46.44 kg/m2  SpO2 96% Physical Exam  Constitutional: She is oriented to person, place, and time. She appears well-developed and well-nourished. No distress.  HENT:  Head: Normocephalic and atraumatic.  Right Ear: External ear normal.  Left Ear: External ear normal.  Nose: Nose normal.  Mouth/Throat: Oropharynx is clear and moist. No oropharyngeal  exudate.  Eyes: Conjunctivae are normal. Pupils are equal, round, and reactive to light. Right eye exhibits no discharge. Left eye exhibits no discharge. No scleral icterus.  Neck: Normal range of motion. Neck supple. No tracheal deviation present. No thyromegaly present.  Cardiovascular: Normal rate, regular rhythm, normal heart sounds and intact distal pulses.  Exam reveals no gallop and no friction rub.   No murmur heard. Pulmonary/Chest: Effort normal and breath sounds normal. No respiratory distress. She has no wheezes. She has no rales. She exhibits no tenderness.  Abdominal: Soft. Bowel sounds are normal. She exhibits no distension and no mass. There is no tenderness. There is no rebound and no guarding.  Genitourinary: Rectum normal, vagina normal and uterus normal. No breast swelling, tenderness, discharge or bleeding. Pelvic exam was performed with patient supine. There is no rash, tenderness or lesion on the right labia. There is no rash, tenderness or lesion on the left labia. Uterus is not enlarged and not tender. Cervix exhibits no motion tenderness, no discharge and no friability. Right adnexum displays no mass, no tenderness and no fullness. Left adnexum displays no mass, no tenderness and no fullness. No erythema or tenderness in the vagina. No vaginal discharge found.  Musculoskeletal: Normal range of motion. She exhibits no edema or tenderness.  Lymphadenopathy:    She has no cervical adenopathy.  Neurological: She is alert and oriented to person, place, and time. No cranial nerve deficit. She exhibits normal muscle tone. Coordination normal.  Skin: Skin is warm and dry. No rash noted. She is not diaphoretic. No erythema. No pallor.  Psychiatric: She has a normal mood and affect. Her behavior is normal. Judgment and thought content normal.          Assessment & Plan:   Problem List Items Addressed This Visit      Unprioritized   Abdominal pain    Bilateral abdominal pain.  Exam normal including pelvic exam. Will check CMP, CBC, urinalysis. Unclear etiology. Suspect increased bowel gas may be playing a role. Pt will call if symptoms are not improving.      Routine general medical examination at a health care facility - Primary    General medical exam normal today including breast and pelvic exam. PAP pending. Mammogram ordered. Colonoscopy UTD. Labs as ordered. Flu vaccine today. Encouraged healthy diet and exercise. Encourage smoking cessation.      Relevant Orders   MM Digital Screening   CBC with Differential/Platelet   Comprehensive metabolic panel   Lipid panel   Microalbumin / creatinine urine ratio  Vit D  25 hydroxy (rtn osteoporosis monitoring)   TSH   POCT urinalysis dipstick       Return in about 2 weeks (around 04/10/2015) for Recheck Abdominal Pain.

## 2015-03-27 NOTE — Progress Notes (Signed)
Pre visit review using our clinic review tool, if applicable. No additional management support is needed unless otherwise documented below in the visit note. 

## 2015-03-27 NOTE — Addendum Note (Signed)
Addended by: Karlene Einstein D on: 03/27/2015 09:08 AM   Modules accepted: Orders

## 2015-03-27 NOTE — Patient Instructions (Addendum)
Call if any persistent symptoms of abdominal pain/pressure. We will send labs today.  Health Maintenance, Female Adopting a healthy lifestyle and getting preventive care can go a long way to promote health and wellness. Talk with your health care provider about what schedule of regular examinations is right for you. This is a good chance for you to check in with your provider about disease prevention and staying healthy. In between checkups, there are plenty of things you can do on your own. Experts have done a lot of research about which lifestyle changes and preventive measures are most likely to keep you healthy. Ask your health care provider for more information. WEIGHT AND DIET  Eat a healthy diet  Be sure to include plenty of vegetables, fruits, low-fat dairy products, and lean protein.  Do not eat a lot of foods high in solid fats, added sugars, or salt.  Get regular exercise. This is one of the most important things you can do for your health.  Most adults should exercise for at least 150 minutes each week. The exercise should increase your heart rate and make you sweat (moderate-intensity exercise).  Most adults should also do strengthening exercises at least twice a week. This is in addition to the moderate-intensity exercise.  Maintain a healthy weight  Body mass index (BMI) is a measurement that can be used to identify possible weight problems. It estimates body fat based on height and weight. Your health care provider can help determine your BMI and help you achieve or maintain a healthy weight.  For females 19 years of age and older:   A BMI below 18.5 is considered underweight.  A BMI of 18.5 to 24.9 is normal.  A BMI of 25 to 29.9 is considered overweight.  A BMI of 30 and above is considered obese.  Watch levels of cholesterol and blood lipids  You should start having your blood tested for lipids and cholesterol at 51 years of age, then have this test every 5  years.  You may need to have your cholesterol levels checked more often if:  Your lipid or cholesterol levels are high.  You are older than 51 years of age.  You are at high risk for heart disease.  CANCER SCREENING   Lung Cancer  Lung cancer screening is recommended for adults 42-2 years old who are at high risk for lung cancer because of a history of smoking.  A yearly low-dose CT scan of the lungs is recommended for people who:  Currently smoke.  Have quit within the past 15 years.  Have at least a 30-pack-year history of smoking. A pack year is smoking an average of one pack of cigarettes a day for 1 year.  Yearly screening should continue until it has been 15 years since you quit.  Yearly screening should stop if you develop a health problem that would prevent you from having lung cancer treatment.  Breast Cancer  Practice breast self-awareness. This means understanding how your breasts normally appear and feel.  It also means doing regular breast self-exams. Let your health care provider know about any changes, no matter how small.  If you are in your 20s or 30s, you should have a clinical breast exam (CBE) by a health care provider every 1-3 years as part of a regular health exam.  If you are 47 or older, have a CBE every year. Also consider having a breast X-ray (mammogram) every year.  If you have a family history of  breast cancer, talk to your health care provider about genetic screening.  If you are at high risk for breast cancer, talk to your health care provider about having an MRI and a mammogram every year.  Breast cancer gene (BRCA) assessment is recommended for women who have family members with BRCA-related cancers. BRCA-related cancers include:  Breast.  Ovarian.  Tubal.  Peritoneal cancers.  Results of the assessment will determine the need for genetic counseling and BRCA1 and BRCA2 testing. Cervical Cancer Your health care provider may  recommend that you be screened regularly for cancer of the pelvic organs (ovaries, uterus, and vagina). This screening involves a pelvic examination, including checking for microscopic changes to the surface of your cervix (Pap test). You may be encouraged to have this screening done every 3 years, beginning at age 50.  For women ages 32-65, health care providers may recommend pelvic exams and Pap testing every 3 years, or they may recommend the Pap and pelvic exam, combined with testing for human papilloma virus (HPV), every 5 years. Some types of HPV increase your risk of cervical cancer. Testing for HPV may also be done on women of any age with unclear Pap test results.  Other health care providers may not recommend any screening for nonpregnant women who are considered low risk for pelvic cancer and who do not have symptoms. Ask your health care provider if a screening pelvic exam is right for you.  If you have had past treatment for cervical cancer or a condition that could lead to cancer, you need Pap tests and screening for cancer for at least 20 years after your treatment. If Pap tests have been discontinued, your risk factors (such as having a new sexual partner) need to be reassessed to determine if screening should resume. Some women have medical problems that increase the chance of getting cervical cancer. In these cases, your health care provider may recommend more frequent screening and Pap tests. Colorectal Cancer  This type of cancer can be detected and often prevented.  Routine colorectal cancer screening usually begins at 51 years of age and continues through 51 years of age.  Your health care provider may recommend screening at an earlier age if you have risk factors for colon cancer.  Your health care provider may also recommend using home test kits to check for hidden blood in the stool.  A small camera at the end of a tube can be used to examine your colon directly  (sigmoidoscopy or colonoscopy). This is done to check for the earliest forms of colorectal cancer.  Routine screening usually begins at age 41.  Direct examination of the colon should be repeated every 5-10 years through 51 years of age. However, you may need to be screened more often if early forms of precancerous polyps or small growths are found. Skin Cancer  Check your skin from head to toe regularly.  Tell your health care provider about any new moles or changes in moles, especially if there is a change in a mole's shape or color.  Also tell your health care provider if you have a mole that is larger than the size of a pencil eraser.  Always use sunscreen. Apply sunscreen liberally and repeatedly throughout the day.  Protect yourself by wearing long sleeves, pants, a wide-brimmed hat, and sunglasses whenever you are outside. HEART DISEASE, DIABETES, AND HIGH BLOOD PRESSURE   High blood pressure causes heart disease and increases the risk of stroke. High blood pressure is more  likely to develop in:  People who have blood pressure in the high end of the normal range (130-139/85-89 mm Hg).  People who are overweight or obese.  People who are African American.  If you are 53-20 years of age, have your blood pressure checked every 3-5 years. If you are 47 years of age or older, have your blood pressure checked every year. You should have your blood pressure measured twice--once when you are at a hospital or clinic, and once when you are not at a hospital or clinic. Record the average of the two measurements. To check your blood pressure when you are not at a hospital or clinic, you can use:  An automated blood pressure machine at a pharmacy.  A home blood pressure monitor.  If you are between 63 years and 25 years old, ask your health care provider if you should take aspirin to prevent strokes.  Have regular diabetes screenings. This involves taking a blood sample to check your  fasting blood sugar level.  If you are at a normal weight and have a low risk for diabetes, have this test once every three years after 51 years of age.  If you are overweight and have a high risk for diabetes, consider being tested at a younger age or more often. PREVENTING INFECTION  Hepatitis B  If you have a higher risk for hepatitis B, you should be screened for this virus. You are considered at high risk for hepatitis B if:  You were born in a country where hepatitis B is common. Ask your health care provider which countries are considered high risk.  Your parents were born in a high-risk country, and you have not been immunized against hepatitis B (hepatitis B vaccine).  You have HIV or AIDS.  You use needles to inject street drugs.  You live with someone who has hepatitis B.  You have had sex with someone who has hepatitis B.  You get hemodialysis treatment.  You take certain medicines for conditions, including cancer, organ transplantation, and autoimmune conditions. Hepatitis C  Blood testing is recommended for:  Everyone born from [birth year] through 1967.  Anyone with known risk factors for hepatitis C. Sexually transmitted infections (STIs)  You should be screened for sexually transmitted infections (STIs) including gonorrhea and chlamydia if:  You are sexually active and are younger than 51 years of age.  You are older than 51 years of age and your health care provider tells you that you are at risk for this type of infection.  Your sexual activity has changed since you were last screened and you are at an increased risk for chlamydia or gonorrhea. Ask your health care provider if you are at risk.  If you do not have HIV, but are at risk, it may be recommended that you take a prescription medicine daily to prevent HIV infection. This is called pre-exposure prophylaxis (PrEP). You are considered at risk if:  You are sexually active and do not regularly use condoms or  know the HIV status of your partner(s).  You take drugs by injection.  You are sexually active with a partner who has HIV. Talk with your health care provider about whether you are at high risk of being infected with HIV. If you choose to begin PrEP, you should first be tested for HIV. You should then be tested every 3 months for as long as you are taking PrEP.  PREGNANCY   If you are premenopausal and you may  become pregnant, ask your health care provider about preconception counseling.  If you may become pregnant, take 400 to 800 micrograms (mcg) of folic acid every day.  If you want to prevent pregnancy, talk to your health care provider about birth control (contraception). OSTEOPOROSIS AND MENOPAUSE   Osteoporosis is a disease in which the bones lose minerals and strength with aging. This can result in serious bone fractures. Your risk for osteoporosis can be identified using a bone density scan.  If you are 85 years of age or older, or if you are at risk for osteoporosis and fractures, ask your health care provider if you should be screened.  Ask your health care provider whether you should take a calcium or vitamin D supplement to lower your risk for osteoporosis.  Menopause may have certain physical symptoms and risks.  Hormone replacement therapy may reduce some of these symptoms and risks. Talk to your health care provider about whether hormone replacement therapy is right for you.  HOME CARE INSTRUCTIONS   Schedule regular health, dental, and eye exams.  Stay current with your immunizations.   Do not use any tobacco products including cigarettes, chewing tobacco, or electronic cigarettes.  If you are pregnant, do not drink alcohol.  If you are breastfeeding, limit how much and how often you drink alcohol.  Limit alcohol intake to no more than 1 drink per day for nonpregnant women. One drink equals 12 ounces of beer, 5 ounces of wine, or 1 ounces of hard liquor.  Do  not use street drugs.  Do not share needles.  Ask your health care provider for help if you need support or information about quitting drugs.  Tell your health care provider if you often feel depressed.  Tell your health care provider if you have ever been abused or do not feel safe at home.   This information is not intended to replace advice given to you by your health care provider. Make sure you discuss any questions you have with your health care provider.   Document Released: 12/17/2010 Document Revised: 06/24/2014 Document Reviewed: 05/05/2013 Elsevier Interactive Patient Education [employer].

## 2015-03-28 LAB — CYTOLOGY - PAP

## 2015-04-05 ENCOUNTER — Ambulatory Visit: Payer: No Typology Code available for payment source

## 2015-04-15 ENCOUNTER — Emergency Department
Admission: EM | Admit: 2015-04-15 | Discharge: 2015-04-15 | Disposition: A | Discharge status: Home / Self Care | Payer: PRIVATE HEALTH INSURANCE | Attending: Emergency Medicine | Admitting: Emergency Medicine

## 2015-04-15 ENCOUNTER — Emergency Department: Payer: PRIVATE HEALTH INSURANCE

## 2015-04-15 DIAGNOSIS — R1032 Left lower quadrant pain: Secondary | ICD-10-CM | POA: Diagnosis present

## 2015-04-15 DIAGNOSIS — Z79899 Other long term (current) drug therapy: Secondary | ICD-10-CM | POA: Insufficient documentation

## 2015-04-15 DIAGNOSIS — R109 Unspecified abdominal pain: Secondary | ICD-10-CM

## 2015-04-15 DIAGNOSIS — K6389 Other specified diseases of intestine: Secondary | ICD-10-CM | POA: Insufficient documentation

## 2015-04-15 DIAGNOSIS — I10 Essential (primary) hypertension: Secondary | ICD-10-CM | POA: Insufficient documentation

## 2015-04-15 DIAGNOSIS — Z3202 Encounter for pregnancy test, result negative: Secondary | ICD-10-CM | POA: Diagnosis not present

## 2015-04-15 DIAGNOSIS — Z720 Tobacco use: Secondary | ICD-10-CM | POA: Insufficient documentation

## 2015-04-15 DIAGNOSIS — K529 Noninfective gastroenteritis and colitis, unspecified: Secondary | ICD-10-CM

## 2015-04-15 DIAGNOSIS — Z792 Long term (current) use of antibiotics: Secondary | ICD-10-CM | POA: Insufficient documentation

## 2015-04-15 DIAGNOSIS — F419 Anxiety disorder, unspecified: Secondary | ICD-10-CM | POA: Diagnosis not present

## 2015-04-15 LAB — URINALYSIS COMPLETE WITH MICROSCOPIC (ARMC ONLY)
Bilirubin Urine: NEGATIVE
Glucose, UA: NEGATIVE mg/dL
Hgb urine dipstick: NEGATIVE
Ketones, ur: NEGATIVE mg/dL
Leukocytes, UA: NEGATIVE
Nitrite: NEGATIVE
PH: 5 (ref 5.0–8.0)
PROTEIN: NEGATIVE mg/dL
SPECIFIC GRAVITY, URINE: 1.024 (ref 1.005–1.030)

## 2015-04-15 LAB — CBC
HEMATOCRIT: 35.6 % (ref 35.0–47.0)
HEMOGLOBIN: 12.1 g/dL (ref 12.0–16.0)
MCH: 30.4 pg (ref 26.0–34.0)
MCHC: 34.1 g/dL (ref 32.0–36.0)
MCV: 89 fL (ref 80.0–100.0)
Platelets: 279 10*3/uL (ref 150–440)
RBC: 4 MIL/uL (ref 3.80–5.20)
RDW: 13.7 % (ref 11.5–14.5)
WBC: 12.5 10*3/uL — ABNORMAL HIGH (ref 3.6–11.0)

## 2015-04-15 LAB — COMPREHENSIVE METABOLIC PANEL
ALBUMIN: 3.3 g/dL — AB (ref 3.5–5.0)
ALT: 18 U/L (ref 14–54)
ANION GAP: 8 (ref 5–15)
AST: 20 U/L (ref 15–41)
Alkaline Phosphatase: 60 U/L (ref 38–126)
BUN: 15 mg/dL (ref 6–20)
CHLORIDE: 103 mmol/L (ref 101–111)
CO2: 26 mmol/L (ref 22–32)
Calcium: 9.2 mg/dL (ref 8.9–10.3)
Creatinine, Ser: 0.68 mg/dL (ref 0.44–1.00)
GFR calc Af Amer: >60 mL/min (ref >60)
GFR calc non Af Amer: >60 mL/min (ref >60)
GLUCOSE: 99 mg/dL (ref 65–99)
POTASSIUM: 4.2 mmol/L (ref 3.5–5.1)
SODIUM: 137 mmol/L (ref 135–145)
Total Bilirubin: 0.3 mg/dL (ref 0.3–1.2)
Total Protein: 7.9 g/dL (ref 6.5–8.1)

## 2015-04-15 LAB — PREGNANCY, URINE: PREG TEST UR: NEGATIVE

## 2015-04-15 LAB — LIPASE, BLOOD: LIPASE: 26 U/L (ref 11–51)

## 2015-04-15 MED ORDER — HYDROCODONE-ACETAMINOPHEN 5-325 MG PO TABS: 1.0000 | ORAL_TABLET | ORAL | Status: DC | PRN

## 2015-04-15 MED ORDER — MORPHINE SULFATE (PF) 4 MG/ML IV SOLN
4.0000 mg | Freq: Once | INTRAVENOUS | Status: AC
  Administered 2015-04-15: 4 mg via INTRAVENOUS
  Filled 2015-04-15: qty 1

## 2015-04-15 MED ORDER — METRONIDAZOLE 500 MG PO TABS: 500.0000 mg | ORAL_TABLET | Freq: Two times a day (BID) | ORAL | Status: DC

## 2015-04-15 MED ORDER — CIPROFLOXACIN HCL 500 MG PO TABS: 500.0000 mg | ORAL_TABLET | Freq: Two times a day (BID) | ORAL | Status: DC

## 2015-04-15 MED ORDER — MORPHINE SULFATE (PF) 4 MG/ML IV SOLN
4.0000 mg | Freq: Once | INTRAVENOUS | Status: AC
  Administered 2015-04-15: 4 mg via INTRAVENOUS

## 2015-04-15 MED ORDER — ONDANSETRON HCL 4 MG/2ML IJ SOLN
4.0000 mg | Freq: Once | INTRAMUSCULAR | Status: AC
  Administered 2015-04-15: 4 mg via INTRAVENOUS
  Filled 2015-04-15: qty 2

## 2015-04-15 MED ORDER — MORPHINE SULFATE (PF) 4 MG/ML IV SOLN
INTRAVENOUS | Status: AC
  Filled 2015-04-15: qty 1

## 2015-04-15 NOTE — ED Notes (Addendum)
LLQ abd pain that started pain has been there for 3 weeks but pain has gotten worse recently. No fever.

## 2015-04-16 NOTE — ED Provider Notes (Signed)
[facility] Emergency Department Provider Note  ____________________________________________  Time seen: On arrival  I have reviewed the triage vital signs and the nursing notes.   HISTORY  Chief Complaint Abdominal Pain    HPI Paula Garrett is a 49 y.o. female presents with complaints of abdominal pain. She reports the pain is primarily in the left lower quadrant and is crampy in nature and moderate to severe. It is worse with pressure. It feels better when she lies on her opposite side. She denies fevers chills. No nausea no vomiting. She has had some loose stools but not diarrhea. No blood in her stools. She has never had this pain before.It started yesterday and became worse today     Past Medical History  Diagnosis Date  . Depression   . Arthritis   . GERD (gastroesophageal reflux disease)   . Thyroid disease   . Hypertension   . Anxiety   . Asthma     Patient Active Problem List   Diagnosis Date Noted  . Abdominal pain 03/27/2015  . Routine general medical examination at a health care facility 01/12/2015  . Narcotic drug use 12/12/2014  . Vitamin D deficiency 07/22/2014  . Chronic fatigue 06/23/2014  . Cephalalgia 06/23/2014  . 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 Severe obesity (BMI >= 40) ([place]) 01/28/2014  . Absolute anemia 01/28/2014  . Special screening for malignant neoplasms, colon 01/28/2014  . Screening for breast cancer 01/28/2014  . Headache(784.0) 01/28/2014  . Low back pain 07/21/2013  . Essential hypertension, benign 01/20/2013  . Unspecified vitamin D deficiency 12/02/2012  . Tobacco abuse 06/09/2012  . Anxiety and depression 04/21/2012  . Insomnia 04/21/2012  . Hypothyroidism 04/21/2012    Past Surgical History  Procedure Laterality Date  . Gastric bypass  2005  . Total thyroidectomy    . Tubal ligation    . Breast surgery      cyst aspiration  . Nasal sinus surgery    . Breast surgery  1998    cyst  aspiration  . Ablation  2006  . Hernia repair    . Endometrial ablation      Current Outpatient Rx  Name  Route  Sig  Dispense  Refill  . cetirizine (ZYRTEC) 10 MG tablet   Oral   Take 10 mg by mouth daily.         . ciprofloxacin (CIPRO) 500 MG tablet   Oral   Take 1 tablet (500 mg total) by mouth 2 (two) times daily.   14 tablet   0   . clonazePAM (KLONOPIN) 1 MG tablet   Oral   Take 1 tablet (1 mg total) by mouth 2 (two) times daily.   60 tablet   1   . CYANOCOBALAMIN IJ   Injection   Inject 1,000 mg as directed every 30 (thirty) days.         . cyclobenzaprine (FLEXERIL) 5 MG tablet   Oral   Take 1-2 tablets (5-10 mg total) by mouth 3 (three) times daily as needed for muscle spasms.   30 tablet   1   . HYDROcodone-acetaminophen (NORCO/VICODIN) 5-325 MG tablet   Oral   Take 1 tablet by mouth every 4 (four) hours as needed for moderate pain.   20 tablet   0   . lisinopril (PRINIVIL,ZESTRIL) 10 MG tablet      TAKE ONE TABLET BY MOUTH ONCE DAILY   90 tablet   0   . metroNIDAZOLE (FLAGYL)  500 MG tablet   Oral   Take 1 tablet (500 mg total) by mouth 2 (two) times daily after a meal.   14 tablet   0   . pantoprazole (PROTONIX) 40 MG tablet   Oral   Take 1 tablet (40 mg total) by mouth daily.   90 tablet   3   . QUEtiapine (SEROQUEL) 50 MG tablet      Take one tablet at bedtime for seven days then increase to two tablets at bedtime.   60 tablet   1   . SYNTHROID 137 MCG tablet   Oral   Take 2 tablets (274 mcg total) by mouth daily before breakfast.   60 tablet   3     Dispense as written.    New Rx change in dose. DAW: CODE 1 Brand   . venlafaxine XR (EFFEXOR-XR) 150 MG 24 hr capsule   Oral   Take 1 capsule (150 mg total) by mouth daily with breakfast.   30 capsule   3     Allergies Review of patient's allergies indicates no known allergies.  Family History  Problem Relation Age of Onset  . Heart disease Mother   . COPD Mother    . Arthritis Mother   . Cancer Mother     uterine  . Lupus Mother   . Anxiety disorder Mother   . Depression Mother   . Drug abuse Mother   . COPD Father   . Arthritis Father   . Heart disease Father   . Heart attack Father   . Alcohol abuse Father   . Diabetes Maternal Grandmother   . Diabetes Maternal Grandfather   . Diabetes Paternal Grandmother   . Diabetes Paternal Grandfather   . Heart disease Brother   . Heart attack Brother   . Drug abuse Brother   . Anxiety disorder Brother   . Depression Brother   . Heart disease Brother     Social History Social History  Substance Use Topics  . Smoking status: Current Every Day Smoker -- 1.00 packs/day for 30 years    Types: Cigarettes    Start date: 03/15/1980  . Smokeless tobacco: Never Used  . Alcohol Use: 13.2 oz/week    2 Standard drinks or equivalent, 20 Glasses of wine, 0 Cans of beer, 0 Shots of liquor per week    Review of Systems  Constitutional: Negative for fever. Eyes: Negative for visual changes. ENT: Negative for sore throat Cardiovascular: Negative for chest pain. Respiratory: Negative for shortness of breath. Gastrointestinal: Positive for abdominal pain Genitourinary: Negative for dysuria. Musculoskeletal: Negative for back pain. Skin: Negative for rash. Neurological: Negative for headaches or focal weakness Psychiatric: Positive for anxiety    ____________________________________________   PHYSICAL EXAM:  VITAL SIGNS: ED Triage Vitals  Enc Vitals Group     BP 04/15/15 1648 155/93 mmHg     Pulse Rate 04/15/15 1648 85     Resp 04/15/15 1648 16     Temp 04/15/15 1648 97.7 F (36.5 C)     Temp Source 04/15/15 1648 Oral     SpO2 04/15/15 1648 96 %     Weight 04/15/15 1648 266 lb (120.657 kg)     Height 04/15/15 1648 5\' 3"  (1.6 m)     Head Cir --      Peak Flow --      Pain Score 04/15/15 1650 4     Pain Loc --      Pain Edu? --  Excl. in [place]? --      Constitutional: Alert and  oriented. Well appearing and in no distress. Eyes: Conjunctivae are normal.  ENT   Head: Normocephalic and atraumatic.   Mouth/Throat: Mucous membranes are moist. Cardiovascular: Normal rate, regular rhythm. Normal and symmetric distal pulses are present in all extremities. No murmurs, rubs, or gallops. Respiratory: Normal respiratory effort without tachypnea nor retractions. Breath sounds are clear and equal bilaterally.  Gastrointestinal: Mild tenderness to palpation in the left lower quadrant. No rebound tenderness No distention. There is no CVA tenderness. Genitourinary: deferred Musculoskeletal: Nontender with normal range of motion in all extremities. No lower extremity tenderness nor edema. Neurologic:  Normal speech and language. No gross focal neurologic deficits are appreciated. Skin:  Skin is warm, dry and intact. No rash noted. Psychiatric: Mood and affect are normal. Patient exhibits appropriate insight and judgment.  ____________________________________________    LABS (pertinent positives/negatives)  Labs Reviewed  COMPREHENSIVE METABOLIC PANEL - Abnormal; Notable for the following:    Albumin 3.3 (*)    All other components within normal limits  CBC - Abnormal; Notable for the following:    WBC 12.5 (*)    All other components within normal limits  URINALYSIS COMPLETEWITH MICROSCOPIC ([facility] ONLY) - Abnormal; Notable for the following:    Color, Urine YELLOW (*)    APPearance HAZY (*)    Bacteria, UA RARE (*)    Squamous Epithelial / LPF 6-30 (*)    All other components within normal limits  LIPASE, BLOOD  PREGNANCY, URINE    ____________________________________________   EKG  None  ____________________________________________    RADIOLOGY I have personally reviewed any xrays that were ordered on this patient: CT shows possible colitis versus epiploic appendagitis  ____________________________________________   PROCEDURES  Procedure(s)  performed: none  Critical Care performed: none  ____________________________________________   INITIAL IMPRESSION / ASSESSMENT AND PLAN / ED COURSE  Pertinent labs & imaging results that were available during my care of the patient were reviewed by me and considered in my medical decision making (see chart for details).  Discussed CT results with patient. She feels significant better after receiving IV morphine in the emergency department. Her labs are overall reassuring. CT shows no surgical pathology. I will treat her with analgesics, antibiotics and have told her to return to the emergency department if any worsening of her symptoms.  ____________________________________________   FINAL CLINICAL IMPRESSION(S) / ED DIAGNOSES  Final diagnoses:  Flank pain, acute  Epiploic appendagitis     Lavonia Drafts, MD 04/16/15 0028

## 2015-04-18 ENCOUNTER — Ambulatory Visit: Payer: PRIVATE HEALTH INSURANCE | Admitting: Psychiatry

## 2015-04-21 ENCOUNTER — Ambulatory Visit
Admission: RE | Admit: 2015-04-21 | Discharge: 2015-04-21 | Disposition: A | Discharge status: Home / Self Care | Payer: No Typology Code available for payment source | Source: Ambulatory Visit | Attending: Internal Medicine | Admitting: Internal Medicine

## 2015-04-21 DIAGNOSIS — Z1231 Encounter for screening mammogram for malignant neoplasm of breast: Secondary | ICD-10-CM | POA: Diagnosis present

## 2015-04-21 DIAGNOSIS — Z0000 Encounter for general adult medical examination without abnormal findings: Secondary | ICD-10-CM

## 2015-04-23 ENCOUNTER — Other Ambulatory Visit: Payer: Self-pay | Admitting: Endocrinology

## 2015-04-25 ENCOUNTER — Encounter: Payer: Self-pay | Admitting: Internal Medicine

## 2015-04-25 ENCOUNTER — Encounter: Payer: Self-pay | Admitting: *Deleted

## 2015-04-25 ENCOUNTER — Ambulatory Visit (INDEPENDENT_AMBULATORY_CARE_PROVIDER_SITE_OTHER): Payer: No Typology Code available for payment source | Admitting: Internal Medicine

## 2015-04-25 VITALS — BP 142/84 | HR 79 | Temp 97.7°F | Wt 271.5 lb

## 2015-04-25 DIAGNOSIS — R1030 Lower abdominal pain, unspecified: Secondary | ICD-10-CM

## 2015-04-25 DIAGNOSIS — K22719 Barrett's esophagus with dysplasia, unspecified: Secondary | ICD-10-CM

## 2015-04-25 LAB — CBC WITH DIFFERENTIAL/PLATELET
BASOS ABS: 0 10*3/uL (ref 0.0–0.1)
Basophils Relative: 0.3 % (ref 0.0–3.0)
EOS ABS: 0 10*3/uL (ref 0.0–0.7)
Eosinophils Relative: 0.5 % (ref 0.0–5.0)
HCT: 38 % (ref 36.0–46.0)
Hemoglobin: 12.4 g/dL (ref 12.0–15.0)
LYMPHS ABS: 2.6 10*3/uL (ref 0.7–4.0)
LYMPHS PCT: 29.9 % (ref 12.0–46.0)
MCHC: 32.7 g/dL (ref 30.0–36.0)
MCV: 89.9 fl (ref 78.0–100.0)
MONOS PCT: 6.6 % (ref 3.0–12.0)
Monocytes Absolute: 0.6 10*3/uL (ref 0.1–1.0)
NEUTROS ABS: 5.4 10*3/uL (ref 1.4–7.7)
NEUTROS PCT: 62.7 % (ref 43.0–77.0)
PLATELETS: 286 10*3/uL (ref 150.0–400.0)
RBC: 4.22 Mil/uL (ref 3.87–5.11)
RDW: 14.2 % (ref 11.5–15.5)
WBC: 8.7 10*3/uL (ref 4.0–10.5)

## 2015-04-25 MED ORDER — HYDROCODONE-ACETAMINOPHEN 5-325 MG PO TABS: 1.0000 | ORAL_TABLET | ORAL | Status: DC | PRN

## 2015-04-25 MED ORDER — FLUCONAZOLE 150 MG PO TABS: 150.0000 mg | ORAL_TABLET | Freq: Once | ORAL | Status: DC

## 2015-04-25 MED ORDER — SYNTHROID 137 MCG PO TABS: 274.0000 ug | ORAL_TABLET | Freq: Every day | ORAL | Status: DC

## 2015-04-25 NOTE — Assessment & Plan Note (Addendum)
Persistent abdominal pain. CT showed colitis versus epiploic appendagitis. Minimal improvement with treatment with Cipro and Flagyl.  Recommended urgent evaluation with general surgery. She declines, stating she must go out of town for a family issue this week. Will plan to have her see general surgery and GI when she returns. Will continue Hydrocodone as needed for severe pain. Encouraged her to seek care in the ED when she is out of town if symptoms are worsening. She understands the potential risks of delaying her care.

## 2015-04-25 NOTE — Progress Notes (Signed)
Subjective:    Patient ID: Paula Garrett, female    DOB: 01-30-1966, 49 y.o.   MRN: [id number]  HPI  49YO female presents for colitis.  Colitis - Seen in ED 10/29 for abdominal pain. CT abdomen showed possible colitis versus epiploic appendagitis in descending colon. Treated with Cipro and Flagyl. Ever since starting Cipro/Flagyl having knee pain. Completed antibiotics, but continues to have abdominal pain. Improved compared to previous. Stools are loose. No fever, chills. Pain is 6-8/10 in pain in both abdomen and knees. No NV. Appetite poor. She reports she cannot pursue further workup of this until after 11/15. Planning to travel to [place] this weekend to care for her son's dogs.   Wt Readings from Last 3 Encounters:  04/25/15 271 lb 8 oz (123.152 kg)  04/15/15 266 lb (120.657 kg)  03/27/15 266 lb 6 oz (120.827 kg)   BP Readings from Last 3 Encounters:  04/25/15 142/84  04/15/15 121/72  03/27/15 149/79    Past Medical History  Diagnosis Date  . Depression   . Arthritis   . GERD (gastroesophageal reflux disease)   . Thyroid disease   . Hypertension   . Anxiety   . Asthma    Family History  Problem Relation Age of Onset  . Heart disease Mother   . COPD Mother   . Arthritis Mother   . Cancer Mother     uterine  . Lupus Mother   . Anxiety disorder Mother   . Depression Mother   . Drug abuse Mother   . COPD Father   . Arthritis Father   . Heart disease Father   . Heart attack Father   . Alcohol abuse Father   . Diabetes Maternal Grandmother   . Diabetes Maternal Grandfather   . Diabetes Paternal Grandmother   . Diabetes Paternal Grandfather   . Heart disease Brother   . Heart attack Brother   . Drug abuse Brother   . Anxiety disorder Brother   . Depression Brother   . Heart disease Brother    Past Surgical History  Procedure Laterality Date  . Gastric bypass  2005  . Total thyroidectomy    . Tubal ligation    . Breast surgery      cyst  aspiration  . Nasal sinus surgery    . Breast surgery  1998    cyst aspiration  . Ablation  2006  . Hernia repair    . Endometrial ablation    . Breast biopsy Left 1998   Social History   Social History  . Marital Status: Married    Spouse Name: N/A  . Number of Children: N/A  . Years of Education: N/A   Social History Main Topics  . Smoking status: Current Every Day Smoker -- 1.00 packs/day for 30 years    Types: Cigarettes    Start date: 03/15/1980  . Smokeless tobacco: Never Used  . Alcohol Use: 13.2 oz/week    2 Standard drinks or equivalent, 20 Glasses of wine, 0 Cans of beer, 0 Shots of liquor per week  . Drug Use: No  . Sexual Activity:    Partners: Male   Other Topics Concern  . None   Social History Narrative   Lives in [place] with husband.      Work - [employer]      Diet - healthy diet   Exercise - limited   Caffeine use: daily    Review of Systems  Constitutional: Positive for  appetite change and fatigue. Negative for fever, chills and unexpected weight change.  Eyes: Negative for visual disturbance.  Respiratory: Negative for cough and shortness of breath.   Cardiovascular: Negative for chest pain, palpitations and leg swelling.  Gastrointestinal: Positive for abdominal pain and diarrhea. Negative for nausea, vomiting, constipation, blood in stool and abdominal distention.  Musculoskeletal: Positive for arthralgias. Negative for joint swelling and gait problem.  Skin: Negative for color change and rash.  Hematological: Negative for adenopathy. Does not bruise/bleed easily.  Psychiatric/Behavioral: Positive for sleep disturbance and dysphoric mood. Negative for suicidal ideas. The patient is nervous/anxious.        Objective:    BP 142/84 mmHg  Pulse 79  Temp(Src) 97.7 F (36.5 C) (Oral)  Wt 271 lb 8 oz (123.152 kg)  SpO2 94% Physical Exam  Constitutional: She is oriented to person, place, and time. She appears well-developed and  well-nourished. No distress.  HENT:  Head: Normocephalic and atraumatic.  Right Ear: External ear normal.  Left Ear: External ear normal.  Nose: Nose normal.  Mouth/Throat: Oropharynx is clear and moist. No oropharyngeal exudate.  Eyes: Conjunctivae and EOM are normal. Pupils are equal, round, and reactive to light. Right eye exhibits no discharge.  Neck: Normal range of motion. Neck supple. No thyromegaly present.  Cardiovascular: Normal rate, regular rhythm, normal heart sounds and intact distal pulses.  Exam reveals no gallop and no friction rub.   No murmur heard. Pulmonary/Chest: Effort normal. No respiratory distress. She has no wheezes. She has no rales.  Abdominal: Soft. Bowel sounds are normal. She exhibits no distension and no mass. There is tenderness (mild diffuse). There is no rebound and no guarding.  Musculoskeletal: Normal range of motion. She exhibits no edema or tenderness.  Lymphadenopathy:    She has no cervical adenopathy.  Neurological: She is alert and oriented to person, place, and time. No cranial nerve deficit. Coordination normal.  Skin: Skin is warm and dry. No rash noted. She is not diaphoretic. No erythema. No pallor.  Psychiatric: Her speech is normal and behavior is normal. Judgment and thought content normal. Her mood appears anxious. She exhibits a depressed mood.          Assessment & Plan:   Problem List Items Addressed This Visit      Unprioritized   Abdominal pain - Primary    Persistent abdominal pain. CT showed colitis versus epiploic appendagitis. Minimal improvement with treatment with Cipro and Flagyl.  Recommended urgent evaluation with general surgery. She declines, stating she must go out of town for a family issue this week. Will plan to have her see general surgery and GI when she returns. Will continue Hydrocodone as needed for severe pain. Encouraged her to seek care in the ED when she is out of town if symptoms are worsening. She  understands the potential risks of delaying her care.      Relevant Medications   HYDROcodone-acetaminophen (NORCO/VICODIN) 5-325 MG tablet   Other Relevant Orders   Ambulatory referral to General Surgery   Ambulatory referral to Gastroenterology   CBC w/Diff   Barrett's esophagus with dysplasia    She reports history of BE. Will set up follow up with GI for follow up endoscopy.          Return in about 3 weeks (around 05/16/2015) for Recheck.

## 2015-04-25 NOTE — Patient Instructions (Signed)
We will set up evaluation with general surgeon to evaluate abdominal pain.  We will also set up follow up with GI.

## 2015-04-25 NOTE — Progress Notes (Signed)
Pre visit review using our clinic review tool, if applicable. No additional management support is needed unless otherwise documented below in the visit note. 

## 2015-04-25 NOTE — Assessment & Plan Note (Signed)
She reports history of BE. Will set up follow up with GI for follow up endoscopy.

## 2015-05-02 ENCOUNTER — Other Ambulatory Visit: Payer: Self-pay | Admitting: Internal Medicine

## 2015-05-03 ENCOUNTER — Ambulatory Visit (INDEPENDENT_AMBULATORY_CARE_PROVIDER_SITE_OTHER): Payer: PRIVATE HEALTH INSURANCE | Admitting: General Surgery

## 2015-05-03 ENCOUNTER — Encounter: Payer: Self-pay | Admitting: General Surgery

## 2015-05-03 ENCOUNTER — Telehealth: Payer: Self-pay

## 2015-05-03 VITALS — BP 154/88 | HR 80 | Resp 18 | Ht 63.0 in | Wt 268.0 lb

## 2015-05-03 DIAGNOSIS — R1032 Left lower quadrant pain: Secondary | ICD-10-CM | POA: Diagnosis not present

## 2015-05-03 NOTE — Telephone Encounter (Signed)
pt called states that for the last 2 weeks she is having joint pain and aching question if it could be the seroquel doing it.

## 2015-05-03 NOTE — Progress Notes (Signed)
Patient ID: Paula Garrett, female   DOB: [birth year], 49 y.o.   MRN: [id number]  Chief Complaint  Patient presents with  . Other    abdominal pain    HPI Paula Garrett is a 49 y.o. female here for evaluation of abdominal pain. She had a CT to check for possible kidney stones on 04/15/15. This study found inflammation in the descending colon with appendagitis most likely. She reports that she had lower abdominal pain that spread to her flank about a month and a half ago. She went to the ER for increased pain on 04/15/15. She now reports that the pain is almost gone and only has very little in the lower left abdominal area.  HPI  Past Medical History  Diagnosis Date  . Depression   . Arthritis   . GERD (gastroesophageal reflux disease)   . Thyroid disease   . Hypertension   . Anxiety   . Asthma     Past Surgical History  Procedure Laterality Date  . Gastric bypass  2005  . Total thyroidectomy    . Tubal ligation    . Breast surgery      cyst aspiration  . Nasal sinus surgery    . Breast surgery  1998    cyst aspiration  . Ablation  2006  . Hernia repair    . Endometrial ablation    . Breast biopsy Left 1998  . Colonoscopy  03/2014    Family History  Problem Relation Age of Onset  . Heart disease Mother   . COPD Mother   . Arthritis Mother   . Cancer Mother     uterine  . Lupus Mother   . Anxiety disorder Mother   . Depression Mother   . Drug abuse Mother   . COPD Father   . Arthritis Father   . Heart disease Father   . Heart attack Father   . Alcohol abuse Father   . Diabetes Maternal Grandmother   . Diabetes Maternal Grandfather   . Diabetes Paternal Grandmother   . Diabetes Paternal Grandfather   . Heart disease Brother   . Heart attack Brother   . Drug abuse Brother   . Anxiety disorder Brother   . Depression Brother   . Heart disease Brother     Social History Social History  Substance Use Topics  . Smoking status: Current Every Day Smoker --  1.00 packs/day for 30 years    Types: Cigarettes    Start date: 03/15/1980  . Smokeless tobacco: Never Used  . Alcohol Use: 13.2 oz/week    2 Standard drinks or equivalent, 20 Glasses of wine, 0 Cans of beer, 0 Shots of liquor per week    No Known Allergies  Current Outpatient Prescriptions  Medication Sig Dispense Refill  . clonazePAM (KLONOPIN) 1 MG tablet Take 1 tablet (1 mg total) by mouth 2 (two) times daily. 60 tablet 1  . cyclobenzaprine (FLEXERIL) 5 MG tablet Take 1-2 tablets (5-10 mg total) by mouth 3 (three) times daily as needed for muscle spasms. 30 tablet 1  . lisinopril (PRINIVIL,ZESTRIL) 10 MG tablet TAKE ONE TABLET BY MOUTH ONCE DAILY 90 tablet 0  . pantoprazole (PROTONIX) 40 MG tablet Take 1 tablet (40 mg total) by mouth daily. 90 tablet 3  . QUEtiapine (SEROQUEL) 50 MG tablet Take one tablet at bedtime for seven days then increase to two tablets at bedtime. 60 tablet 1  . SYNTHROID 137 MCG tablet Take 2 tablets (274  mcg total) by mouth daily before breakfast. 60 tablet 3  . venlafaxine XR (EFFEXOR-XR) 150 MG 24 hr capsule Take 1 capsule (150 mg total) by mouth daily with breakfast. 30 capsule 3  . HYDROcodone-acetaminophen (NORCO/VICODIN) 5-325 MG tablet Take 1 tablet by mouth every 6 (six) hours as needed for moderate pain. 30 tablet 0   No current facility-administered medications for this visit.    Review of Systems Review of Systems  Constitutional: Negative.   Respiratory: Negative.   Cardiovascular: Negative.   Gastrointestinal: Positive for abdominal pain (mild now) and diarrhea (due to bypass surgery). Negative for nausea, vomiting, constipation, blood in stool, abdominal distention, anal bleeding and rectal pain.    Blood pressure 154/88, pulse 80, resp. rate 18, height 5\' 3"  (1.6 m), weight 268 lb (121.564 kg).  Physical Exam Physical Exam  Constitutional: She is oriented to person, place, and time. She appears well-developed and well-nourished.  Eyes:  Conjunctivae are normal. No scleral icterus.  Neck: Neck supple.  Cardiovascular: Normal rate, regular rhythm and normal heart sounds.   Pulmonary/Chest: Effort normal and breath sounds normal.  Abdominal: Soft. Normal appearance. There is no tenderness.  Lymphadenopathy:    She has no cervical adenopathy.  Neurological: She is alert and oriented to person, place, and time.  Skin: Skin is warm and dry.  Psychiatric: She has a normal mood and affect.    Data Reviewed CT scan for stone completed through the emergency department dated 04/15/2015 with a diagnosis of 3 weeks of lower abdominal pain was notable for a focal area of inflammation in the junction of the descending/sigmoid colon just above appendicis epiploicae inflammation. 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 Uterine leiomyoma.  Assessment    Benign abdominal exam. Self resolution of appendices epiploicae inflammation.    Plan    No activity or dietary restrictions. She should call should she develop recurrent symptoms.    PCP: Ronette Deter   Robert Bellow 05/05/2015, 2:22 PM

## 2015-05-04 ENCOUNTER — Ambulatory Visit (INDEPENDENT_AMBULATORY_CARE_PROVIDER_SITE_OTHER): Payer: No Typology Code available for payment source | Admitting: Family Medicine

## 2015-05-04 ENCOUNTER — Encounter: Payer: Self-pay | Admitting: Family Medicine

## 2015-05-04 VITALS — BP 124/84 | HR 79 | Temp 97.8°F | Ht 63.0 in | Wt 268.4 lb

## 2015-05-04 DIAGNOSIS — M791 Myalgia: Secondary | ICD-10-CM

## 2015-05-04 DIAGNOSIS — R1030 Lower abdominal pain, unspecified: Secondary | ICD-10-CM | POA: Diagnosis not present

## 2015-05-04 DIAGNOSIS — M7918 Myalgia, other site: Secondary | ICD-10-CM | POA: Insufficient documentation

## 2015-05-04 MED ORDER — HYDROCODONE-ACETAMINOPHEN 5-325 MG PO TABS: 1.0000 | ORAL_TABLET | Freq: Four times a day (QID) | ORAL | Status: DC | PRN

## 2015-05-04 NOTE — Assessment & Plan Note (Signed)
Unremarkable exam. I refilled the patient's Vicodin 5/325 #30. All future refills need to come from the patient's primary as there are comments in the chart about potential narcotic abuse.

## 2015-05-04 NOTE — Progress Notes (Signed)
Subjective:  Patient ID: Paula Garrett, female    DOB: 18-Oct-1965  Age: 49 y.o. MRN: [id number]  CC: Shoulder pain, Knee pain  HPI:  49 year old female with an extensive past medical history presents with the above complaints.  Patient reports that for the past 2-3 weeks she's been experiencing bilateral shoulder pain as well as knee pain. She treats her symptoms to recent Cipro use as it is known to cause joint aches and pains. Exacerbated by certain activities. No relieving factors. She's been taking hydrocodone for her pain and is currently in need of a refill. No other complaints this time. No recent fall, trauma, injury.  Social Hx   Social History   Social History  . Marital Status: Married    Spouse Name: N/A  . Number of Children: N/A  . Years of Education: N/A   Social History Main Topics  . Smoking status: Current Every Day Smoker -- 1.00 packs/day for 30 years    Types: Cigarettes    Start date: 03/15/1980  . Smokeless tobacco: Never Used  . Alcohol Use: 13.2 oz/week    2 Standard drinks or equivalent, 20 Glasses of wine, 0 Cans of beer, 0 Shots of liquor per week  . Drug Use: No  . Sexual Activity:    Partners: Male   Other Topics Concern  . None   Social History Narrative   Lives in [place] with husband.      Work - [employer]      Diet - healthy diet   Exercise - limited   Caffeine use: daily   Review of Systems  Constitutional: Negative.   Musculoskeletal: Positive for arthralgias.   Objective:  BP 124/84 mmHg  Pulse 79  Temp(Src) 97.8 F (36.6 C) (Oral)  Ht 5\' 3"  (1.6 m)  Wt 268 lb 6 oz (121.734 kg)  BMI 47.55 kg/m2  SpO2 95%  BP/Weight 05/04/2015 05/03/2015 0000000  Systolic BP [id number] A999333  Diastolic BP 84 88 84  Wt. (Lbs) 268.38 268 271.5  BMI 47.55 47.49 48.11   Physical Exam  Constitutional: She appears well-developed. No distress.  Cardiovascular: Normal rate and regular rhythm.   Pulmonary/Chest: Effort normal and breath  sounds normal.  Musculoskeletal:  Knee exam unremarkable bilaterally. Shoulder exam with good range of motion. Patient reported pain with range of motion. No focal findings.  Neurological: She is alert.  Vitals reviewed.  Lab Results  Component Value Date   WBC 8.7 04/25/2015   HGB 12.4 04/25/2015   HCT 38.0 04/25/2015   PLT 286.0 04/25/2015   GLUCOSE 99 04/15/2015   CHOL 210* 03/27/2015   TRIG 146.0 03/27/2015   HDL 78.50 03/27/2015   LDLCALC 102* 03/27/2015   ALT 18 04/15/2015   AST 20 04/15/2015   NA 137 04/15/2015   K 4.2 04/15/2015   CL 103 04/15/2015   CREATININE 0.68 04/15/2015   BUN 15 04/15/2015   CO2 26 04/15/2015   TSH 0.73 03/27/2015   HGBA1C 5.8 09/28/2014   MICROALBUR 1.6 03/27/2015   Assessment & Plan:   Problem List Items Addressed This Visit    Musculoskeletal pain    Unremarkable exam. I refilled the patient's Vicodin 5/325 #30. All future refills need to come from the patient's primary as there are comments in the chart about potential narcotic abuse.      Abdominal pain - Primary   Relevant Medications   HYDROcodone-acetaminophen (NORCO/VICODIN) 5-325 MG tablet      Meds ordered this encounter  Medications  . HYDROcodone-acetaminophen (NORCO/VICODIN) 5-325 MG tablet    Sig: Take 1 tablet by mouth every 6 (six) hours as needed for moderate pain.    Dispense:  30 tablet    Refill:  0   Follow-up: PRN  Thersa Salt, DO

## 2015-05-04 NOTE — Patient Instructions (Signed)
Use the pain medication as directed.  See Dr. Gilford Rile for future refills or for recurrent pain  Take care  Dr. Lacinda Axon

## 2015-05-04 NOTE — Progress Notes (Signed)
Pre visit review using our clinic review tool, if applicable. No additional management support is needed unless otherwise documented below in the visit note. 

## 2015-05-05 ENCOUNTER — Ambulatory Visit: Payer: PRIVATE HEALTH INSURANCE | Admitting: Psychiatry

## 2015-05-05 DIAGNOSIS — R1032 Left lower quadrant pain: Secondary | ICD-10-CM | POA: Insufficient documentation

## 2015-05-10 NOTE — Telephone Encounter (Signed)
left message to call office back in regards to how her joint pain was doing.

## 2015-05-16 ENCOUNTER — Encounter: Payer: Self-pay | Admitting: Psychiatry

## 2015-05-16 ENCOUNTER — Ambulatory Visit (INDEPENDENT_AMBULATORY_CARE_PROVIDER_SITE_OTHER): Payer: PRIVATE HEALTH INSURANCE | Admitting: Psychiatry

## 2015-05-16 VITALS — BP 152/102 | HR 98 | Temp 98.9°F | Ht 63.0 in | Wt 265.0 lb

## 2015-05-16 DIAGNOSIS — F332 Major depressive disorder, recurrent severe without psychotic features: Secondary | ICD-10-CM

## 2015-05-16 MED ORDER — BREXPIPRAZOLE 1 MG PO TABS: 1.0000 mg | ORAL_TABLET | Freq: Every day | ORAL | Status: DC

## 2015-05-16 MED ORDER — CLONAZEPAM 1 MG PO TABS: 1.0000 mg | ORAL_TABLET | Freq: Two times a day (BID) | ORAL | Status: DC

## 2015-05-16 NOTE — Progress Notes (Addendum)
BH MD/PA/NP OP Progress Note  05/16/2015 3:01 PM Paula Garrett  MRN:  [id number]  Subjective:  Patient returns for follow-up of her major depressive disorder and generalized anxiety disorder. She states her biggest issues right now are physical problems. She states about 3 weeks after she started taking the Seroquel she developed joint pain. She states she's not sure to Seroquel but she did read this as a possible side effect of Seroquel. She stated however that the Seroquel has been helpful to her mood. She states she notices that she does not react as negatively to things. She states she does feel like her mood has been more level on the Seroquel. She states since she was worried about the Seroquel causing joint pain she has not remained at the 100 mg dose but rather just stayed at 50 mg.  She does state mental and emotional she feels like she's been slightly better. However she states that the joint pain has caused her stress. She also states she visited the emergency room in late October due to abdominal pain and was told she had appendagitis. She stated that also she took to take Cipro and that this caused her a lot of side effects. Chief Complaint: Physical Chief Complaint    Follow-up; Medication Refill     Visit Diagnosis:     ICD-9-CM ICD-10-CM   1. Major depressive disorder, recurrent, severe without psychotic features ([place]) 296.33 F33.2     Past Medical History:  Past Medical History  Diagnosis Date  . Depression   . Arthritis   . GERD (gastroesophageal reflux disease)   . Thyroid disease   . Hypertension   . Anxiety   . Asthma     Past Surgical History  Procedure Laterality Date  . Gastric bypass  2005  . Total thyroidectomy    . Tubal ligation    . Breast surgery      cyst aspiration  . Nasal sinus surgery    . Breast surgery  1998    cyst aspiration  . Ablation  2006  . Hernia repair    . Endometrial ablation    . Breast biopsy Left 1998  . Colonoscopy   03/2014   Family History:  Family History  Problem Relation Age of Onset  . Heart disease Mother   . COPD Mother   . Arthritis Mother   . Cancer Mother     uterine  . Lupus Mother   . Anxiety disorder Mother   . Depression Mother   . Drug abuse Mother   . COPD Father   . Arthritis Father   . Heart disease Father   . Heart attack Father   . Alcohol abuse Father   . Diabetes Maternal Grandmother   . Diabetes Maternal Grandfather   . Diabetes Paternal Grandmother   . Diabetes Paternal Grandfather   . Heart disease Brother   . Heart attack Brother   . Drug abuse Brother   . Anxiety disorder Brother   . Depression Brother   . Heart disease Brother    Social History:  Social History   Social History  . Marital Status: Married    Spouse Name: N/A  . Number of Children: N/A  . Years of Education: N/A   Social History Main Topics  . Smoking status: Current Every Day Smoker -- 1.00 packs/day for 30 years    Types: Cigarettes    Start date: 03/15/1980  . Smokeless tobacco: Never Used  . Alcohol Use:  13.2 oz/week    20 Glasses of wine, 0 Cans of beer, 0 Shots of liquor, 2 Standard drinks or equivalent per week  . Drug Use: No  . Sexual Activity:    Partners: Male   Other Topics Concern  . None   Social History Narrative   Lives in [place] with husband.      Work - [employer]      Diet - healthy diet   Exercise - limited   Caffeine use: daily   Additional History:   Assessment:   Musculoskeletal: Strength & Muscle Tone: within normal limits Gait & Station: normal Patient leans: N/A  Psychiatric Specialty Exam: HPI  Review of Systems  Musculoskeletal: Positive for joint pain.  Psychiatric/Behavioral: Negative for depression, suicidal ideas, hallucinations, memory loss and substance abuse. The patient is not nervous/anxious and does not have insomnia.   All other systems reviewed and are negative.   Blood pressure 152/102, pulse 98, temperature 98.9  F (37.2 C), temperature source Tympanic, height 5\' 3"  (1.6 m), weight 265 lb (120.203 kg), SpO2 94 %.Body mass index is 46.95 kg/(m^2).  General Appearance: Well Groomed  Eye Contact:  Good  Speech:  Normal Rate  Volume:  Normal  Mood:  Good mentally  Affect:  Constricted  Thought Process:  Linear  Orientation:  Full (Time, Place, and Person)  Thought Content:  Negative  Suicidal Thoughts:  No  Homicidal Thoughts:  No  Memory:  Immediate;   Good Recent;   Good Remote;   Good  Judgement:  Good  Insight:  Good  Psychomotor Activity:  Negative  Concentration:  Good  Recall:  Good  Fund of Knowledge: Good  Language: Good  Akathisia:  Negative  Handed:   AIMS (if indicated):  Done 05/16/15 and normal  Assets:  Communication Skills Desire for Improvement  ADL's:  Intact  Cognition: WNL  Sleep:  good   Is the patient at risk to self?  No. Has the patient been a risk to self in the past 6 months?  No. Has the patient been a risk to self within the distant past?  No. Is the patient a risk to others?  No. Has the patient been a risk to others in the past 6 months?  No. Has the patient been a risk to others within the distant past?  No.  Current Medications: Current Outpatient Prescriptions  Medication Sig Dispense Refill  . Brexpiprazole (REXULTI) 1 MG TABS Take 1 mg by mouth daily. 30 tablet 1  . clonazePAM (KLONOPIN) 1 MG tablet Take 1 tablet (1 mg total) by mouth 2 (two) times daily. 60 tablet 2  . lisinopril (PRINIVIL,ZESTRIL) 10 MG tablet TAKE ONE TABLET BY MOUTH ONCE DAILY 90 tablet 0  . pantoprazole (PROTONIX) 40 MG tablet Take 1 tablet (40 mg total) by mouth daily. 90 tablet 3  . SYNTHROID 137 MCG tablet Take 2 tablets (274 mcg total) by mouth daily before breakfast. 60 tablet 3  . venlafaxine XR (EFFEXOR-XR) 150 MG 24 hr capsule Take 1 capsule (150 mg total) by mouth daily with breakfast. 30 capsule 3  . cyclobenzaprine (FLEXERIL) 5 MG tablet Take 1-2 tablets (5-10 mg  total) by mouth 3 (three) times daily as needed for muscle spasms. (Patient not taking: Reported on 05/16/2015) 30 tablet 1  . HYDROcodone-acetaminophen (NORCO/VICODIN) 5-325 MG tablet Take 1 tablet by mouth every 6 (six) hours as needed for moderate pain. (Patient not taking: Reported on 05/16/2015) 30 tablet 0   No  current facility-administered medications for this visit.    Medical Decision Making:  Established Problem, Stable/Improving (1), Review of Medication Regimen & Side Effects (2) and Review of New Medication or Change in Dosage (2)  Treatment Plan Summary:Medication management and Plan   Major depressive disorder, recurrent, moderate-patient has been on Effexor XR which was recently raised in September to 150 mg daily. Did discuss the options would be to maximize her Effexor or perhaps looking at augmenting. She reports benefit with the Seroquel however she is concerned that it might be contributing to joint pain. Order to be sure within a discontinue the Seroquel at this time. Risk and benefits of Rexulti of been discussed and patient is able to consent. We will start rexulti 0.5 milligrams for 7 days and then she will go to 1 mg daily. We have provided patient with samples and then I have sent a prescription to her pharmacy that is on hold for her so she can assess her tolerance of the samples   Generalized anxiety disorder-patient continue her Klonopin 1 mg twice a day as needed.  Patient will follow up in 1 month.  Faith Rogue 05/16/2015, 3:01 PM

## 2015-05-16 NOTE — Patient Instructions (Signed)
Rexulti, take 0.5 (1/2) mg for seven days the increase to one mg daily.

## 2015-05-29 ENCOUNTER — Other Ambulatory Visit: Payer: Self-pay | Admitting: Internal Medicine

## 2015-05-29 NOTE — Telephone Encounter (Signed)
pt had appt on 05-16-15 and a follow up appt on 06-08-15

## 2015-05-31 ENCOUNTER — Telehealth: Payer: Self-pay

## 2015-05-31 ENCOUNTER — Encounter: Payer: Self-pay | Admitting: Internal Medicine

## 2015-05-31 ENCOUNTER — Ambulatory Visit (INDEPENDENT_AMBULATORY_CARE_PROVIDER_SITE_OTHER): Payer: No Typology Code available for payment source | Admitting: Internal Medicine

## 2015-05-31 VITALS — BP 146/95 | HR 86 | Temp 97.4°F | Ht 63.0 in | Wt 265.0 lb

## 2015-05-31 DIAGNOSIS — M2550 Pain in unspecified joint: Secondary | ICD-10-CM | POA: Diagnosis not present

## 2015-05-31 DIAGNOSIS — I10 Essential (primary) hypertension: Secondary | ICD-10-CM

## 2015-05-31 DIAGNOSIS — M791 Myalgia: Secondary | ICD-10-CM | POA: Diagnosis not present

## 2015-05-31 DIAGNOSIS — M609 Myositis, unspecified: Secondary | ICD-10-CM

## 2015-05-31 DIAGNOSIS — E538 Deficiency of other specified B group vitamins: Secondary | ICD-10-CM

## 2015-05-31 DIAGNOSIS — IMO0001 Reserved for inherently not codable concepts without codable children: Secondary | ICD-10-CM

## 2015-05-31 LAB — CBC WITH DIFFERENTIAL/PLATELET
Basophils Absolute: 0 10*3/uL (ref 0.0–0.1)
Basophils Relative: 0.3 % (ref 0.0–3.0)
EOS PCT: 0.7 % (ref 0.0–5.0)
Eosinophils Absolute: 0.1 10*3/uL (ref 0.0–0.7)
HEMATOCRIT: 38.1 % (ref 36.0–46.0)
HEMOGLOBIN: 12.8 g/dL (ref 12.0–15.0)
Lymphocytes Relative: 31.9 % (ref 12.0–46.0)
Lymphs Abs: 2.5 10*3/uL (ref 0.7–4.0)
MCHC: 33.5 g/dL (ref 30.0–36.0)
MCV: 88.6 fl (ref 78.0–100.0)
MONOS PCT: 6 % (ref 3.0–12.0)
Monocytes Absolute: 0.5 10*3/uL (ref 0.1–1.0)
NEUTROS ABS: 4.8 10*3/uL (ref 1.4–7.7)
Neutrophils Relative %: 61.1 % (ref 43.0–77.0)
Platelets: 297 10*3/uL (ref 150.0–400.0)
RBC: 4.3 Mil/uL (ref 3.87–5.11)
RDW: 14.4 % (ref 11.5–15.5)
WBC: 7.9 10*3/uL (ref 4.0–10.5)

## 2015-05-31 LAB — COMPREHENSIVE METABOLIC PANEL
ALBUMIN: 3.5 g/dL (ref 3.5–5.2)
ALK PHOS: 60 U/L (ref 39–117)
ALT: 22 U/L (ref 0–35)
AST: 21 U/L (ref 0–37)
BUN: 10 mg/dL (ref 6–23)
CALCIUM: 9.6 mg/dL (ref 8.4–10.5)
CHLORIDE: 100 meq/L (ref 96–112)
CO2: 28 mEq/L (ref 19–32)
CREATININE: 0.59 mg/dL (ref 0.40–1.20)
GFR: 113.89 mL/min (ref >60.00)
Glucose, Bld: 80 mg/dL (ref 70–99)
POTASSIUM: 4.6 meq/L (ref 3.5–5.1)
Sodium: 136 mEq/L (ref 135–145)
TOTAL PROTEIN: 7.7 g/dL (ref 6.0–8.3)
Total Bilirubin: 0.3 mg/dL (ref 0.2–1.2)

## 2015-05-31 LAB — CK: Total CK: 37 U/L (ref 7–177)

## 2015-05-31 LAB — C-REACTIVE PROTEIN: CRP: 0.3 mg/dL — AB (ref 0.5–20.0)

## 2015-05-31 LAB — RHEUMATOID FACTOR: RHEUMATOID FACTOR: 32 [IU]/mL — AB (ref <=14)

## 2015-05-31 LAB — SEDIMENTATION RATE: SED RATE: 29 mm/h — AB (ref 0–22)

## 2015-05-31 MED ORDER — LISINOPRIL 20 MG PO TABS: 20.0000 mg | ORAL_TABLET | Freq: Every day | ORAL | Status: DC

## 2015-05-31 MED ORDER — CYANOCOBALAMIN 1000 MCG/ML IJ SOLN
1000.0000 ug | Freq: Once | INTRAMUSCULAR | Status: AC
Start: 2015-05-31 — End: 2015-05-31
  Administered 2015-05-31: 1000 ug via INTRAMUSCULAR

## 2015-05-31 MED ORDER — GABAPENTIN 100 MG PO CAPS: 100.0000 mg | ORAL_CAPSULE | Freq: Three times a day (TID) | ORAL | Status: DC

## 2015-05-31 NOTE — Progress Notes (Signed)
Subjective:    Patient ID: Paula Garrett, female    DOB: [birth year], 49 y.o.   MRN: [id number]  HPI  49YO female presents for acute visit.  Continues to have severe pain in knees and shoulders bilaterally ever since taking Cipro. Ongoing now for several weeks. Pain is aching. Worse with movement and weight bearing. Pain across lateral shoulders and upper arms and in thigh muscles as well. No improvement in pain with Tylenol or Hydrocodone. Her mother has Lupus. Pain has limited her ability to exercise. She is trying to work on [employer] and exercise.  HTN - She is worried about her BP being elevated recently. Noted to be elevated at visit with psychiatrist and also with GI physician. No CP, HA, palpitations. She has been compliant with Lisinopril 66m daily.   Wt Readings from Last 3 Encounters:  05/31/15 265 lb (120.203 kg)  05/16/15 265 lb (120.203 kg)  05/04/15 268 lb 6 oz (121.734 kg)   BP Readings from Last 3 Encounters:  05/31/15 146/95  05/16/15 152/102  05/04/15 124/84    Past Medical History  Diagnosis Date  . Depression   . Arthritis   . GERD (gastroesophageal reflux disease)   . Thyroid disease   . Hypertension   . Anxiety   . Asthma    Family History  Problem Relation Age of Onset  . Heart disease Mother   . COPD Mother   . Arthritis Mother   . Cancer Mother     uterine  . Lupus Mother   . Anxiety disorder Mother   . Depression Mother   . Drug abuse Mother   . COPD Father   . Arthritis Father   . Heart disease Father   . Heart attack Father   . Alcohol abuse Father   . Diabetes Maternal Grandmother   . Diabetes Maternal Grandfather   . Diabetes Paternal Grandmother   . Diabetes Paternal Grandfather   . Heart disease Brother   . Heart attack Brother   . Drug abuse Brother   . Anxiety disorder Brother   . Depression Brother   . Heart disease Brother    Past Surgical History  Procedure Laterality Date  . Gastric bypass  2005  . Total  thyroidectomy    . Tubal ligation    . Breast surgery      cyst aspiration  . Nasal sinus surgery    . Breast surgery  1998    cyst aspiration  . Ablation  2006  . Hernia repair    . Endometrial ablation    . Breast biopsy Left 1998  . Colonoscopy  03/2014   Social History   Social History  . Marital Status: Married    Spouse Name: N/A  . Number of Children: N/A  . Years of Education: N/A   Social History Main Topics  . Smoking status: Current Every Day Smoker -- 1.00 packs/day for 30 years    Types: Cigarettes    Start date: 03/15/1980  . Smokeless tobacco: Never Used  . Alcohol Use: 13.2 oz/week    20 Glasses of wine, 0 Cans of beer, 0 Shots of liquor, 2 Standard drinks or equivalent per week  . Drug Use: No  . Sexual Activity:    Partners: Male   Other Topics Concern  . None   Social History Narrative   [place]      Work - [employer]      Diet -  healthy diet   Exercise - limited   Caffeine use: daily    Review of Systems  Constitutional: Negative for fever, chills, appetite change, fatigue and unexpected weight change.  Eyes: Negative for visual disturbance.  Respiratory: Negative for shortness of breath.   Cardiovascular: Negative for chest pain and leg swelling.  Gastrointestinal: Negative for abdominal pain.  Musculoskeletal: Positive for myalgias, back pain, arthralgias and gait problem. Negative for joint swelling, neck pain and neck stiffness.  Skin: Negative for color change and rash.  Neurological: Negative for dizziness, facial asymmetry, weakness, light-headedness, numbness and headaches.  Hematological: Negative for adenopathy. Does not bruise/bleed easily.  Psychiatric/Behavioral: Positive for sleep disturbance and dysphoric mood. Negative for suicidal ideas. The patient is nervous/anxious.        Objective:    BP 146/95 mmHg  Pulse 86  Temp(Src) 97.4 F (36.3 C) (Oral)  Ht '5\' 3"'  (1.6 m)  Wt 265 lb (120.203 kg)   BMI 46.95 kg/m2  SpO2 96% Physical Exam  Constitutional: She is oriented to person, place, and time. She appears well-developed and well-nourished. No distress.  HENT:  Head: Normocephalic and atraumatic.  Right Ear: External ear normal.  Left Ear: External ear normal.  Nose: Nose normal.  Mouth/Throat: Oropharynx is clear and moist. No oropharyngeal exudate.  Eyes: Conjunctivae are normal. Pupils are equal, round, and reactive to light. Right eye exhibits no discharge. Left eye exhibits no discharge. No scleral icterus.  Neck: Normal range of motion. Neck supple. No tracheal deviation present. No thyromegaly present.  Cardiovascular: Normal rate, regular rhythm, normal heart sounds and intact distal pulses.  Exam reveals no gallop and no friction rub.   No murmur heard. Pulmonary/Chest: Effort normal and breath sounds normal. No respiratory distress. She has no wheezes. She has no rales. She exhibits no tenderness.  Musculoskeletal: Normal range of motion. She exhibits no edema or tenderness.       Right shoulder: She exhibits pain. She exhibits normal range of motion, no tenderness and normal strength.       Left shoulder: She exhibits pain. She exhibits normal range of motion, no tenderness and normal strength.       Right knee: She exhibits normal range of motion and no swelling.       Left knee: She exhibits normal range of motion and no swelling.  Lymphadenopathy:    She has no cervical adenopathy.  Neurological: She is alert and oriented to person, place, and time. No cranial nerve deficit. She exhibits normal muscle tone. Coordination normal.  Skin: Skin is warm and dry. No rash noted. She is not diaphoretic. No erythema. No pallor.  Psychiatric: Her speech is normal and behavior is normal. Judgment and thought content normal. Her mood appears anxious. Cognition and memory are normal. She exhibits a depressed mood.          Assessment & Plan:   Problem List Items Addressed  This Visit      Unprioritized   Arthralgia - Primary    Diffuse arthralgia and myalgia which started fairly suddenly after taking Cipro. Exam normal today. Will check inflammatory markers and screen for lupus and PMR. Start Neurontin 175m po tid. Discussed potential side effects of this medication. Follow up in 2 weeks.      Relevant Medications   gabapentin (NEURONTIN) 100 MG capsule   Other Relevant Orders   CBC with Differential/Platelet   Comprehensive metabolic panel   Sed Rate (ESR)   C-reactive protein   ANA  Rheumatoid Factor   Essential hypertension, benign    BP Readings from Last 3 Encounters:  05/31/15 146/95  05/16/15 152/102  05/04/15 124/84   BP elevated. Will increase Lisinopril to 68m. Follow up recheck in 2 weeks. Will plan to recheck Cr and K at that time.      Relevant Medications   lisinopril (PRINIVIL,ZESTRIL) 20 MG tablet    Other Visit Diagnoses    Myalgia and myositis        Relevant Orders    CK (Creatine Kinase)        Return in about 2 weeks (around 06/14/2015) for Recheck.

## 2015-05-31 NOTE — Assessment & Plan Note (Signed)
BP Readings from Last 3 Encounters:  05/31/15 146/95  05/16/15 152/102  05/04/15 124/84   BP elevated. Will increase Lisinopril to 20mg . Follow up recheck in 2 weeks. Will plan to recheck Cr and K at that time.

## 2015-05-31 NOTE — Assessment & Plan Note (Signed)
Diffuse arthralgia and myalgia which started fairly suddenly after taking Cipro. Exam normal today. Will check inflammatory markers and screen for lupus and PMR. Start Neurontin 100mg  po tid. Discussed potential side effects of this medication. Follow up in 2 weeks.

## 2015-05-31 NOTE — Patient Instructions (Addendum)
Labs today.  Start Neurontin 100mg  three times daily.  Email with update early next week.  Increase Lisinopril to 20mg  daily.  Follow up 2 weeks.

## 2015-05-31 NOTE — Telephone Encounter (Signed)
pt states that you had changed her from seroquel 100mg  to rexulti because she had thought that the seroquel was giving her knee pain.  pt states that she has been off the seroquel and that she is still having knee pain.  pt states she is going to primary today about the knee pain.  Pt wants to know if she can go back on seroquel, she like it better then the rexulti.

## 2015-05-31 NOTE — Addendum Note (Signed)
Addended by: Vernetta Honey on: 05/31/2015 05:10 PM   Modules accepted: Orders

## 2015-06-01 ENCOUNTER — Encounter: Payer: Self-pay | Admitting: Internal Medicine

## 2015-06-01 LAB — ANA: Anti Nuclear Antibody(ANA): NEGATIVE

## 2015-06-02 NOTE — Telephone Encounter (Signed)
left message that it was ok to restart the seroquel per dr. Jimmye Norman.  left message that if she had any question or needed a refill to please notifiy the office.

## 2015-06-08 ENCOUNTER — Encounter: Payer: Self-pay | Admitting: Psychiatry

## 2015-06-08 ENCOUNTER — Ambulatory Visit (INDEPENDENT_AMBULATORY_CARE_PROVIDER_SITE_OTHER): Payer: PRIVATE HEALTH INSURANCE | Admitting: Psychiatry

## 2015-06-08 VITALS — BP 138/86 | HR 91 | Temp 99.4°F | Ht 63.0 in | Wt 268.2 lb

## 2015-06-08 DIAGNOSIS — F332 Major depressive disorder, recurrent severe without psychotic features: Secondary | ICD-10-CM

## 2015-06-08 MED ORDER — QUETIAPINE FUMARATE 100 MG PO TABS: 100.0000 mg | ORAL_TABLET | Freq: Every day | ORAL | Status: DC

## 2015-06-08 MED ORDER — VENLAFAXINE HCL ER 150 MG PO CP24: 150.0000 mg | ORAL_CAPSULE | ORAL | Status: DC

## 2015-06-08 NOTE — Progress Notes (Signed)
[facility] MD/PA/NP OP Progress Note  06/08/2015 2:47 PM Paula Garrett  MRN:  [id number]  Subjective:  Patient returns for follow-up of her major depressive disorder and generalized anxiety disorder. She states she is feeling good. At her last office visit we had started Rexulti and discontinued her Seroquel. We did this because she thought that the Seroquel may have been causing her to have joint pains. However she states she's had testing and it appears that she has rheumatoid arthritis and thus she realized it was not the Seroquel. She contacted the office and requested to restart Seroquel and discontinue the [place]. Return to Seroquel 100 mg at night. She states she feels good she feels like her mood is bright. She states that typically this time of year she somewhat stressed and depressed but she states this year she's not and she is ready for the holidays.  She indicated her primary care started some gabapentin for her joint pain and she states that has been helpful. Chief Complaint: Good Chief Complaint    Follow-up; Medication Refill     Visit Diagnosis:     ICD-9-CM ICD-10-CM   1. Major depressive disorder, recurrent, severe without psychotic features ([place]) 296.33 F33.2     Past Medical History:  Past Medical History  Diagnosis Date  . Depression   . Arthritis   . GERD (gastroesophageal reflux disease)   . Thyroid disease   . Hypertension   . Anxiety   . Asthma     Past Surgical History  Procedure Laterality Date  . Gastric bypass  2005  . Total thyroidectomy    . Tubal ligation    . Breast surgery      cyst aspiration  . Nasal sinus surgery    . Breast surgery  1998    cyst aspiration  . Ablation  2006  . Hernia repair    . Endometrial ablation    . Breast biopsy Left 1998  . Colonoscopy  03/2014   Family History:  Family History  Problem Relation Age of Onset  . Heart disease Mother   . COPD Mother   . Arthritis Mother   . Cancer Mother     uterine  . Lupus  Mother   . Anxiety disorder Mother   . Depression Mother   . Drug abuse Mother   . COPD Father   . Arthritis Father   . Heart disease Father   . Heart attack Father   . Alcohol abuse Father   . Diabetes Maternal Grandmother   . Diabetes Maternal Grandfather   . Diabetes Paternal Grandmother   . Diabetes Paternal Grandfather   . Heart disease Brother   . Heart attack Brother   . Drug abuse Brother   . Anxiety disorder Brother   . Depression Brother   . Heart disease Brother    Social History:  Social History   Social History  . Marital Status: Married    Spouse Name: N/A  . Number of Children: N/A  . Years of Education: N/A   Social History Main Topics  . Smoking status: Current Every Day Smoker -- 1.00 packs/day for 30 years    Types: Cigarettes    Start date: 03/15/1980  . Smokeless tobacco: Never Used  . Alcohol Use: 13.2 oz/week    20 Glasses of wine, 0 Cans of beer, 0 Shots of liquor, 2 Standard drinks or equivalent per week  . Drug Use: No  . Sexual Activity:    Partners: Male  Other Topics Concern  . None   Social History Narrative   Lives in [place] with husband.      Work - [employer]      Diet - healthy diet   Exercise - limited   Caffeine use: daily   Additional History:   Assessment:   Musculoskeletal: Strength & Muscle Tone: within normal limits Gait & Station: normal Patient leans: N/A  Psychiatric Specialty Exam: HPI  Review of Systems  Musculoskeletal: Negative for joint pain.  Psychiatric/Behavioral: Negative for depression, suicidal ideas, hallucinations, memory loss and substance abuse. The patient is not nervous/anxious and does not have insomnia.   All other systems reviewed and are negative.   Blood pressure 138/86, pulse 91, temperature 99.4 F (37.4 C), temperature source Tympanic, height 5\' 3"  (1.6 m), weight 268 lb 3.2 oz (121.655 kg), SpO2 95 %.Body mass index is 47.52 kg/(m^2).  General Appearance: Well Groomed  Eye  Contact:  Good  Speech:  Normal Rate  Volume:  Normal  Mood:  Good  Affect:  Bright, Able to laugh  Thought Process:  Linear  Orientation:  Full (Time, Place, and Person)  Thought Content:  Negative  Suicidal Thoughts:  No  Homicidal Thoughts:  No  Memory:  Immediate;   Good Recent;   Good Remote;   Good  Judgement:  Good  Insight:  Good  Psychomotor Activity:  Negative  Concentration:  Good  Recall:  Good  Fund of Knowledge: Good  Language: Good  Akathisia:  Negative  Handed:   AIMS (if indicated):  Done 05/16/15 and normal  Assets:  Communication Skills Desire for Improvement  ADL's:  Intact  Cognition: WNL  Sleep:  good   Is the patient at risk to self?  No. Has the patient been a risk to self in the past 6 months?  No. Has the patient been a risk to self within the distant past?  No. Is the patient a risk to others?  No. Has the patient been a risk to others in the past 6 months?  No. Has the patient been a risk to others within the distant past?  No.  Current Medications: Current Outpatient Prescriptions  Medication Sig Dispense Refill  . cetirizine (ZYRTEC) 10 MG tablet Take by mouth.    . clonazePAM (KLONOPIN) 1 MG tablet Take 1 tablet (1 mg total) by mouth 2 (two) times daily. 60 tablet 2  . gabapentin (NEURONTIN) 100 MG capsule Take 1 capsule (100 mg total) by mouth 3 (three) times daily. 90 capsule 3  . lisinopril (PRINIVIL,ZESTRIL) 20 MG tablet Take 1 tablet (20 mg total) by mouth daily. 90 tablet 3  . pantoprazole (PROTONIX) 40 MG tablet Take 1 tablet (40 mg total) by mouth daily. 90 tablet 3  . SYNTHROID 137 MCG tablet Take 2 tablets (274 mcg total) by mouth daily before breakfast. 60 tablet 3  . venlafaxine XR (EFFEXOR-XR) 150 MG 24 hr capsule Take 1 capsule (150 mg total) by mouth every morning. 30 capsule 4  . QUEtiapine (SEROQUEL) 100 MG tablet Take 1 tablet (100 mg total) by mouth at bedtime. 30 tablet 4   No current facility-administered medications  for this visit.    Medical Decision Making:  Established Problem, Stable/Improving (1), Review of Medication Regimen & Side Effects (2) and Review of New Medication or Change in Dosage (2)  Treatment Plan Summary:Medication management and Plan   Major depressive disorder, recurrent, moderate-patient has been on Effexor XR 150 mg. She resumed Seroquel 100  mg at bedtime for augmentation and reports good benefit on this combination.  Generalized anxiety disorder-patient continue her Klonopin 1 mg twice a day as needed.   Patient will follow up in 1 month.  Faith Rogue 06/08/2015, 2:47 PM

## 2015-06-13 NOTE — Progress Notes (Signed)
Spoke with Paula Garrett, discontinued medication.

## 2015-06-15 ENCOUNTER — Encounter: Payer: Self-pay | Admitting: Family Medicine

## 2015-06-15 ENCOUNTER — Ambulatory Visit (INDEPENDENT_AMBULATORY_CARE_PROVIDER_SITE_OTHER): Payer: No Typology Code available for payment source | Admitting: Family Medicine

## 2015-06-15 VITALS — BP 118/72 | HR 96 | Temp 97.7°F | Ht 63.0 in | Wt 262.0 lb

## 2015-06-15 DIAGNOSIS — R52 Pain, unspecified: Secondary | ICD-10-CM

## 2015-06-15 DIAGNOSIS — B349 Viral infection, unspecified: Secondary | ICD-10-CM

## 2015-06-15 LAB — POCT INFLUENZA A/B
INFLUENZA B, POC: NEGATIVE
Influenza A, POC: NEGATIVE

## 2015-06-15 NOTE — Patient Instructions (Signed)
Nice to meet you. Your symptoms are likely related to a viral illness. Your flu test is negative. You need to stay well hydrated. Please drink plenty of water. You can take Tylenol for any discomfort or fever. If you develop shortness of breath, chest pain, persistent fevers, recurrent severe abdominal pain, blood in your stool, vaginal discharge, urinary complaints, decreased oral intake, or any new or change in symptoms please seek medical attention.

## 2015-06-15 NOTE — Progress Notes (Signed)
Pre visit review using our clinic review tool, if applicable. No additional management support is needed unless otherwise documented below in the visit note. 

## 2015-06-15 NOTE — Progress Notes (Signed)
Patient ID: Paula Garrett, female   DOB: 1966/03/02, 49 y.o.   MRN: [id number]  Paula Rumps, MD Phone: [phone]  Paula Garrett is a 49 y.o. female who presents today for same-day appointment.  Patient comes in with a variety of complaints. She notes yesterday she developed sharp intermittent brief pains in her lower stomach. She felt like her energy was drained at that time. She then developed body aches. When she got home she developed liquid diarrhea. The abdominal pain resolved within 20-30 minutes of having the diarrhea. Has not had recurrence today of abdominal pain. Has continued to have diarrhea. She noted intermittent temperatures to 100.45F yesterday. She doesn't have any dysuria or urinary complaints. No vaginal discharge or vaginal complaints. No vaginal bleeding. She had a uterine ablation 10 years ago and has not had a period since that time. States she could not be pregnant. She's not ever had any abdominal surgeries. She has no abdominal pain today. No vomiting. Some mild nausea yesterday. No blood in her stool. She notes at this time her her major complaint is body aches, nasal congestion, and feeling drained. She has no abdominal pain since yesterday.  PMH: Current smoker   ROS see history of present illness  Objective  Physical Exam Filed Vitals:   06/15/15 1354  BP: 118/72  Pulse: 96  Temp: 97.7 F (36.5 C)    Physical Exam  Constitutional:  No acute distress, tired-appearing  HENT:  Head: Normocephalic and atraumatic.  Right Ear: External ear normal.  Left Ear: External ear normal.  Nose: Nose normal.  Mouth/Throat: Oropharynx is clear and moist. No oropharyngeal exudate.  Normal TMs bilaterally  Eyes: Conjunctivae are normal. Pupils are equal, round, and reactive to light.  Neck: Neck supple.  Cardiovascular: Normal rate, regular rhythm and normal heart sounds.   Pulmonary/Chest: Effort normal and breath sounds normal. No respiratory distress. She  has no wheezes. She has no rales.  Abdominal: Soft. Bowel sounds are normal. She exhibits no distension. There is no tenderness. There is no rebound and no guarding.  Musculoskeletal: She exhibits no edema.  Lymphadenopathy:    She has no cervical adenopathy.  Neurological: She is alert. Gait normal.  Skin: Skin is warm and dry.     Assessment/Plan: Please see individual problem list.  Viral illness History and exam consistent with viral illness likely leading to upper respiratory issues and diarrhea. Suspect abdominal discomfort was related to GI upset given that it has resolved following diarrhea and has not recurred. She has benign abdominal exam today. She is afebrile and her vital signs are stable. She had a negative rapid flu in the office. I discussed supportive care with the patient. She'll stay well hydrated. She'll continue to monitor. She is given return precautions.    Orders Placed This Encounter  Procedures  . POCT Influenza A/B    Dragon voice recognition software was used during the dictation process of this note. If any phrases or words seem inappropriate it is likely secondary to the translation process being inefficient.  Paula Garrett

## 2015-06-16 ENCOUNTER — Encounter: Payer: Self-pay | Admitting: Family Medicine

## 2015-06-16 DIAGNOSIS — B349 Viral infection, unspecified: Secondary | ICD-10-CM | POA: Insufficient documentation

## 2015-06-16 NOTE — Assessment & Plan Note (Signed)
History and exam consistent with viral illness likely leading to upper respiratory issues and diarrhea. Suspect abdominal discomfort was related to GI upset given that it has resolved following diarrhea and has not recurred. She has benign abdominal exam today. She is afebrile and her vital signs are stable. She had a negative rapid flu in the office. I discussed supportive care with the patient. She'll stay well hydrated. She'll continue to monitor. She is given return precautions.

## 2015-06-26 ENCOUNTER — Ambulatory Visit (INDEPENDENT_AMBULATORY_CARE_PROVIDER_SITE_OTHER)
Admission: RE | Admit: 2015-06-26 | Discharge: 2015-06-26 | Disposition: A | Discharge status: Home / Self Care | Payer: Managed Care, Other (non HMO) | Source: Ambulatory Visit | Attending: Internal Medicine | Admitting: Internal Medicine

## 2015-06-26 ENCOUNTER — Ambulatory Visit (INDEPENDENT_AMBULATORY_CARE_PROVIDER_SITE_OTHER): Payer: Managed Care, Other (non HMO) | Admitting: Internal Medicine

## 2015-06-26 ENCOUNTER — Encounter: Payer: Self-pay | Admitting: Internal Medicine

## 2015-06-26 VITALS — BP 122/71 | HR 88 | Temp 97.8°F | Ht 63.0 in | Wt 265.0 lb

## 2015-06-26 DIAGNOSIS — E538 Deficiency of other specified B group vitamins: Secondary | ICD-10-CM | POA: Diagnosis not present

## 2015-06-26 DIAGNOSIS — M2550 Pain in unspecified joint: Secondary | ICD-10-CM | POA: Diagnosis not present

## 2015-06-26 DIAGNOSIS — J209 Acute bronchitis, unspecified: Secondary | ICD-10-CM

## 2015-06-26 MED ORDER — PREDNISONE 10 MG PO TABS: ORAL_TABLET | ORAL | Status: DC

## 2015-06-26 MED ORDER — ALBUTEROL SULFATE HFA 108 (90 BASE) MCG/ACT IN AERS: 2.0000 | INHALATION_SPRAY | Freq: Four times a day (QID) | RESPIRATORY_TRACT | Status: DC | PRN

## 2015-06-26 MED ORDER — CYANOCOBALAMIN 1000 MCG/ML IJ SOLN
1000.0000 ug | Freq: Once | INTRAMUSCULAR | Status: AC
  Administered 2015-06-26: 1000 ug via INTRAMUSCULAR

## 2015-06-26 MED ORDER — HYDROCOD POLST-CPM POLST ER 10-8 MG/5ML PO SUER: 5.0000 mL | Freq: Two times a day (BID) | ORAL | Status: DC | PRN

## 2015-06-26 MED ORDER — AMOXICILLIN-POT CLAVULANATE 875-125 MG PO TABS: 1.0000 | ORAL_TABLET | Freq: Two times a day (BID) | ORAL | Status: DC

## 2015-06-26 NOTE — Assessment & Plan Note (Signed)
Symptoms initially improved with Gabapentin, then recently worsening with cold weather. Will continue Gabapentin. Adding prednisone for bronchitis which should also help with arthralgia. Recheck 2 weeks.

## 2015-06-26 NOTE — Assessment & Plan Note (Signed)
Symptoms of cough and dyspnea most consistent with bronchitis. Will start Augmentin. Prednisone taper. Encouraged smoking cessation. Albuterol prn. Tussionex as needed for cough. Discussed potential side effects of medication. Encouraged her to take a probiotic yogurt while on abx. CXR today. Follow up in 2 weeks and prn.

## 2015-06-26 NOTE — Progress Notes (Signed)
Subjective:    Patient ID: Paula Garrett, female    DOB: 1966-04-23, 50 y.o.   MRN: [id number]  HPI  50YO female presents for follow up.  Recently seen by Dr. Caryl Bis with viral infection. Also recently started on Gabapentin for arthralgia.  Initially symptoms improved, but now having dry cough and chest tightness. Has sore throat. Mild dyspnea. No wheezing. Not currently using albuterol. No recent fever. However felt hot yesterday. Trying to cut back on smoking.  Symptoms of arthralgia were initially improved with use of Gabapentin, then worsened with cooler weather. She is continuing to take Gabapentin.   Wt Readings from Last 3 Encounters:  06/26/15 265 lb (120.203 kg)  06/15/15 262 lb (118.842 kg)  06/08/15 268 lb 3.2 oz (121.655 kg)   BP Readings from Last 3 Encounters:  06/26/15 122/71  06/15/15 118/72  06/08/15 138/86    Past Medical History  Diagnosis Date  . Depression   . Arthritis   . GERD (gastroesophageal reflux disease)   . Thyroid disease   . Hypertension   . Anxiety   . Asthma    Family History  Problem Relation Age of Onset  . Heart disease Mother   . COPD Mother   . Arthritis Mother   . Cancer Mother     uterine  . Lupus Mother   . Anxiety disorder Mother   . Depression Mother   . Drug abuse Mother   . COPD Father   . Arthritis Father   . Heart disease Father   . Heart attack Father   . Alcohol abuse Father   . Diabetes Maternal Grandmother   . Diabetes Maternal Grandfather   . Diabetes Paternal Grandmother   . Diabetes Paternal Grandfather   . Heart disease Brother   . Heart attack Brother   . Drug abuse Brother   . Anxiety disorder Brother   . Depression Brother   . Heart disease Brother    Past Surgical History  Procedure Laterality Date  . Gastric bypass  2005  . Total thyroidectomy    . Tubal ligation    . Breast surgery      cyst aspiration  . Nasal sinus surgery    . Breast surgery  1998    cyst aspiration  .  Ablation  2006  . Hernia repair    . Endometrial ablation    . Breast biopsy Left 1998  . Colonoscopy  03/2014   Social History   Social History  . Marital Status: Married    Spouse Name: N/A  . Number of Children: N/A  . Years of Education: N/A   Social History Main Topics  . Smoking status: Current Every Day Smoker -- 1.00 packs/day for 30 years    Types: Cigarettes    Start date: 03/15/1980  . Smokeless tobacco: Never Used  . Alcohol Use: 13.2 oz/week    20 Glasses of wine, 0 Cans of beer, 0 Shots of liquor, 2 Standard drinks or equivalent per week  . Drug Use: No  . Sexual Activity:    Partners: Male   Other Topics Concern  . None   Social History Narrative   Lives in [place] with husband.      Work - [employer]      Diet - healthy diet   Exercise - limited   Caffeine use: daily    Review of Systems  Constitutional: Positive for fatigue. Negative for fever, chills and unexpected weight change.  HENT: Negative  for congestion, ear discharge, ear pain, facial swelling, hearing loss, mouth sores, nosebleeds, postnasal drip, rhinorrhea, sinus pressure, sneezing, sore throat, tinnitus, trouble swallowing and voice change.   Eyes: Negative for pain, discharge, redness and visual disturbance.  Respiratory: Positive for cough, chest tightness, shortness of breath and wheezing. Negative for stridor.   Cardiovascular: Negative for chest pain, palpitations and leg swelling.  Musculoskeletal: Positive for arthralgias. Negative for myalgias, neck pain and neck stiffness.  Skin: Negative for color change and rash.  Neurological: Negative for dizziness, weakness, light-headedness and headaches.  Hematological: Negative for adenopathy.  Psychiatric/Behavioral: Positive for sleep disturbance.       Objective:    BP 122/71 mmHg  Pulse 88  Temp(Src) 97.8 F (36.6 C) (Oral)  Ht 5\' 3"  (1.6 m)  Wt 265 lb (120.203 kg)  BMI 46.95 kg/m2  SpO2 95% Physical Exam    Constitutional: She is oriented to person, place, and time. She appears well-developed and well-nourished. No distress.  HENT:  Head: Normocephalic and atraumatic.  Right Ear: External ear normal.  Left Ear: External ear normal.  Nose: Nose normal.  Mouth/Throat: Oropharynx is clear and moist. No oropharyngeal exudate.  Eyes: Conjunctivae are normal. Pupils are equal, round, and reactive to light. Right eye exhibits no discharge. Left eye exhibits no discharge. No scleral icterus.  Neck: Normal range of motion. Neck supple. No tracheal deviation present. No thyromegaly present.  Cardiovascular: Normal rate, regular rhythm, normal heart sounds and intact distal pulses.  Exam reveals no gallop and no friction rub.   No murmur heard. Pulmonary/Chest: Effort normal. No accessory muscle usage. No tachypnea. No respiratory distress. She has decreased breath sounds. She has no wheezes. She has rhonchi. She has no rales. She exhibits no tenderness.  Musculoskeletal: Normal range of motion. She exhibits no edema or tenderness.  Lymphadenopathy:    She has no cervical adenopathy.  Neurological: She is alert and oriented to person, place, and time. No cranial nerve deficit. She exhibits normal muscle tone. Coordination normal.  Skin: Skin is warm and dry. No rash noted. She is not diaphoretic. No erythema. No pallor.  Psychiatric: She has a normal mood and affect. Her behavior is normal. Judgment and thought content normal.          Assessment & Plan:   Problem List Items Addressed This Visit      Unprioritized   Acute bronchitis - Primary    Symptoms of cough and dyspnea most consistent with bronchitis. Will start Augmentin. Prednisone taper. Encouraged smoking cessation. Albuterol prn. Tussionex as needed for cough. Discussed potential side effects of medication. Encouraged her to take a probiotic yogurt while on abx. CXR today. Follow up in 2 weeks and prn.      Relevant Medications    predniSONE (DELTASONE) 10 MG tablet   amoxicillin-clavulanate (AUGMENTIN) 875-125 MG tablet   Other Relevant Orders   DG Chest 2 View   Arthralgia    Symptoms initially improved with Gabapentin, then recently worsening with cold weather. Will continue Gabapentin. Adding prednisone for bronchitis which should also help with arthralgia. Recheck 2 weeks.          Return in about 2 weeks (around 07/10/2015) for Recheck.

## 2015-06-26 NOTE — Patient Instructions (Signed)
Chest xray today at [facility].  Start Augmentin twice daily.  Start Prednisone taper.  Use Tussionex as needed for cough.  Follow up 2 weeks and sooner as needed.

## 2015-06-26 NOTE — Addendum Note (Signed)
Addended by: Vernetta Honey on: 06/26/2015 05:00 PM   Modules accepted: Orders

## 2015-06-26 NOTE — Progress Notes (Signed)
Pre visit review using our clinic review tool, if applicable. No additional management support is needed unless otherwise documented below in the visit note. 

## 2015-06-27 ENCOUNTER — Telehealth: Payer: Self-pay | Admitting: *Deleted

## 2015-06-27 ENCOUNTER — Telehealth: Payer: Self-pay | Admitting: Internal Medicine

## 2015-06-27 ENCOUNTER — Other Ambulatory Visit: Payer: Self-pay | Admitting: Internal Medicine

## 2015-06-27 ENCOUNTER — Ambulatory Visit
Admission: RE | Admit: 2015-06-27 | Discharge: 2015-06-27 | Disposition: A | Discharge status: Home / Self Care | Payer: Managed Care, Other (non HMO) | Source: Ambulatory Visit | Attending: Internal Medicine | Admitting: Internal Medicine

## 2015-06-27 DIAGNOSIS — R9389 Abnormal findings on diagnostic imaging of other specified body structures: Secondary | ICD-10-CM

## 2015-06-27 DIAGNOSIS — R938 Abnormal findings on diagnostic imaging of other specified body structures: Secondary | ICD-10-CM | POA: Insufficient documentation

## 2015-06-27 MED ORDER — IOHEXOL 300 MG/ML  SOLN
75.0000 mL | Freq: Once | INTRAMUSCULAR | Status: AC | PRN
  Administered 2015-06-27: 75 mL via INTRAVENOUS

## 2015-06-27 MED ORDER — AZITHROMYCIN 250 MG PO TABS: ORAL_TABLET | ORAL | Status: DC

## 2015-06-27 NOTE — Progress Notes (Signed)
Called and spoke with pt about CXR results. Will get CT chest today.

## 2015-06-27 NOTE — Telephone Encounter (Signed)
Olivia Mackie from [facility] radiology wanted to make sure Dr Gilford Rile received patient Xray results. If not please call  Olivia Mackie at [phone].

## 2015-06-27 NOTE — Telephone Encounter (Signed)
Patient chest x-ray was faxed and is already in chart. The office where x-ray took place called to inform of final result.

## 2015-06-27 NOTE — Telephone Encounter (Signed)
Dr.Walker received x-ray results via epic

## 2015-06-28 ENCOUNTER — Encounter: Payer: Self-pay | Admitting: Internal Medicine

## 2015-07-10 ENCOUNTER — Encounter
Admission: RE | Disposition: A | Discharge status: Home / Self Care | Payer: Self-pay | Source: Ambulatory Visit | Attending: Gastroenterology

## 2015-07-10 ENCOUNTER — Ambulatory Visit
Admission: RE | Admit: 2015-07-10 | Discharge: 2015-07-10 | Disposition: A | Discharge status: Home / Self Care | Payer: Managed Care, Other (non HMO) | Source: Ambulatory Visit | Attending: Gastroenterology | Admitting: Gastroenterology

## 2015-07-10 ENCOUNTER — Ambulatory Visit: Payer: Managed Care, Other (non HMO) | Admitting: Anesthesiology

## 2015-07-10 ENCOUNTER — Encounter: Payer: Self-pay | Admitting: *Deleted

## 2015-07-10 DIAGNOSIS — J45909 Unspecified asthma, uncomplicated: Secondary | ICD-10-CM | POA: Diagnosis not present

## 2015-07-10 DIAGNOSIS — Z538 Procedure and treatment not carried out for other reasons: Secondary | ICD-10-CM | POA: Insufficient documentation

## 2015-07-10 DIAGNOSIS — Z79899 Other long term (current) drug therapy: Secondary | ICD-10-CM | POA: Diagnosis not present

## 2015-07-10 DIAGNOSIS — E079 Disorder of thyroid, unspecified: Secondary | ICD-10-CM | POA: Insufficient documentation

## 2015-07-10 DIAGNOSIS — Z8701 Personal history of pneumonia (recurrent): Secondary | ICD-10-CM | POA: Insufficient documentation

## 2015-07-10 DIAGNOSIS — K219 Gastro-esophageal reflux disease without esophagitis: Secondary | ICD-10-CM | POA: Insufficient documentation

## 2015-07-10 DIAGNOSIS — K2270 Barrett's esophagus without dysplasia: Secondary | ICD-10-CM | POA: Insufficient documentation

## 2015-07-10 DIAGNOSIS — I10 Essential (primary) hypertension: Secondary | ICD-10-CM | POA: Insufficient documentation

## 2015-07-10 DIAGNOSIS — F329 Major depressive disorder, single episode, unspecified: Secondary | ICD-10-CM | POA: Insufficient documentation

## 2015-07-10 DIAGNOSIS — M1991 Primary osteoarthritis, unspecified site: Secondary | ICD-10-CM | POA: Diagnosis not present

## 2015-07-10 SURGERY — ESOPHAGOGASTRODUODENOSCOPY (EGD) WITH PROPOFOL
Anesthesia: General

## 2015-07-10 MED ORDER — SODIUM CHLORIDE 0.9 % IV SOLN: INTRAVENOUS | Status: DC

## 2015-07-10 MED ORDER — SODIUM CHLORIDE 0.9 % IV SOLN
INTRAVENOUS | Status: DC
  Administered 2015-07-10: 10:00:00 via INTRAVENOUS

## 2015-07-10 NOTE — H&P (Signed)
Outpatient short stay form Pre-procedure 07/10/2015 9:48 AM Lollie Sails MD  Primary Physician: Dr. Ronette Deter  Reason for visit:  EGD  History of present illness:  Patient is a 50 year old female presenting today for EGD in regards to follow-up on Barrett's esophagus. Her last EGD was 05/27/2014 with findings of Barrett's esophagus and esophagitis at that time. She has been taking Protonix 40 mg daily. She denies any heartburn symptoms or dysphagia.    Current facility-administered medications:  .  0.9 %  sodium chloride infusion, , Intravenous, Continuous, Lollie Sails, MD, Last Rate: 20 mL/hr at 07/10/15 0946 .  0.9 %  sodium chloride infusion, , Intravenous, Continuous, Lollie Sails, MD  Prescriptions prior to admission  Medication Sig Dispense Refill Last Dose  . albuterol (PROVENTIL HFA;VENTOLIN HFA) 108 (90 Base) MCG/ACT inhaler Inhale 2 puffs into the lungs every 6 (six) hours as needed for wheezing or shortness of breath. 1 Inhaler 0 Past Month at Unknown time  . clonazePAM (KLONOPIN) 1 MG tablet Take 1 tablet (1 mg total) by mouth 2 (two) times daily. 60 tablet 2 07/09/2015 at Unknown time  . gabapentin (NEURONTIN) 100 MG capsule Take 1 capsule (100 mg total) by mouth 3 (three) times daily. 90 capsule 3 07/09/2015 at Unknown time  . lisinopril (PRINIVIL,ZESTRIL) 20 MG tablet Take 1 tablet (20 mg total) by mouth daily. 90 tablet 3 07/09/2015 at Unknown time  . pantoprazole (PROTONIX) 40 MG tablet Take 1 tablet (40 mg total) by mouth daily. 90 tablet 3 07/09/2015 at Unknown time  . QUEtiapine (SEROQUEL) 100 MG tablet Take 1 tablet (100 mg total) by mouth at bedtime. 30 tablet 4 07/09/2015 at Unknown time  . SYNTHROID 137 MCG tablet Take 2 tablets (274 mcg total) by mouth daily before breakfast. 60 tablet 3 07/09/2015 at Unknown time  . venlafaxine XR (EFFEXOR-XR) 150 MG 24 hr capsule Take 1 capsule (150 mg total) by mouth every morning. 30 capsule 4 07/09/2015 at Unknown  time  . amoxicillin-clavulanate (AUGMENTIN) 875-125 MG tablet Take 1 tablet by mouth 2 (two) times daily. 20 tablet 0   . azithromycin (ZITHROMAX Z-PAK) 250 MG tablet Take 2 pills day 1, then 1 pill daily days 2-5 6 tablet 0   . cetirizine (ZYRTEC) 10 MG tablet Take by mouth.   Taking  . chlorpheniramine-HYDROcodone (TUSSIONEX PENNKINETIC ER) 10-8 MG/5ML SUER Take 5 mLs by mouth every 12 (twelve) hours as needed. 140 mL 0   . predniSONE (DELTASONE) 10 MG tablet Take 60mg  by mouth on day 1, then taper by 10mg  daily until gone 21 tablet 0      No Known Allergies   Past Medical History  Diagnosis Date  . Depression   . Arthritis   . GERD (gastroesophageal reflux disease)   . Thyroid disease   . Hypertension   . Anxiety   . Asthma     Review of systems:      Physical Exam    Heart and lungs: Regular rate and rhythm without rub or gallop lungs are clear to auscultation    HEENT: Norm cephalic atraumatic eyes are anicteric    Other:     Pertinant exam for procedure: Soft protuberant nontender nondistended bowel sounds positive normoactive.    Planned proceedures: EGD and indicated procedures. I have discussed the risks benefits and complications of procedures to include not limited to bleeding, infection, perforation and the risk of sedation and the patient wishes to proceed.  On further discussion with  the patient states that she just completed a course of antibiotics for a diagnosis of pneumonia. This is been discussed with anesthesia. We'll need to wait about 2 or 3 weeks before we can proceed with EGD.    Lollie Sails, MD Gastroenterology 07/10/2015  9:48 AM

## 2015-07-10 NOTE — Anesthesia Preprocedure Evaluation (Deleted)
Anesthesia Evaluation  Patient identified by MRN, date of birth, ID band Patient awake    Reviewed: Allergy & Precautions, NPO status , Patient's Chart, lab work & pertinent test results  Airway Mallampati: III  TM Distance: <3 FB     Dental no notable dental hx.    Pulmonary asthma , Current Smoker,  Patient stated that she just had pneumonia.Marland Kitchenswelling of the ankles we will reschedule   Stated this after we had seen her   Pulmonary exam normal        Cardiovascular hypertension, Pt. on medications Normal cardiovascular exam     Neuro/Psych  Headaches, Anxiety Depression    GI/Hepatic Neg liver ROS, GERD  Medicated and Controlled,  Endo/Other  Hypothyroidism   Renal/GU negative Renal ROS  negative genitourinary   Musculoskeletal  (+) Arthritis , Osteoarthritis,    Abdominal Normal abdominal exam  (+)   Peds negative pediatric ROS (+)  Hematology  (+) anemia ,   Anesthesia Other Findings   Reproductive/Obstetrics                            Anesthesia Physical Anesthesia Plan  ASA: III  Anesthesia Plan: General   Post-op Pain Management:    Induction: Intravenous  Airway Management Planned: Nasal Cannula  Additional Equipment:   Intra-op Plan:   Post-operative Plan:   Informed Consent: I have reviewed the patients History and Physical, chart, labs and discussed the procedure including the risks, benefits and alternatives for the proposed anesthesia with the patient or authorized representative who has indicated his/her understanding and acceptance.   Dental advisory given  Plan Discussed with: CRNA and Surgeon  Anesthesia Plan Comments:         Anesthesia Quick Evaluation

## 2015-07-11 ENCOUNTER — Ambulatory Visit
Admission: RE | Admit: 2015-07-11 | Discharge: 2015-07-11 | Disposition: A | Discharge status: Home / Self Care | Payer: Managed Care, Other (non HMO) | Source: Ambulatory Visit | Attending: Internal Medicine | Admitting: Internal Medicine

## 2015-07-11 ENCOUNTER — Encounter: Payer: Self-pay | Admitting: Internal Medicine

## 2015-07-11 ENCOUNTER — Ambulatory Visit (INDEPENDENT_AMBULATORY_CARE_PROVIDER_SITE_OTHER): Payer: Managed Care, Other (non HMO) | Admitting: Internal Medicine

## 2015-07-11 VITALS — BP 117/73 | HR 79 | Temp 97.8°F | Ht 63.0 in | Wt 262.2 lb

## 2015-07-11 DIAGNOSIS — A419 Sepsis, unspecified organism: Secondary | ICD-10-CM | POA: Insufficient documentation

## 2015-07-11 DIAGNOSIS — J189 Pneumonia, unspecified organism: Secondary | ICD-10-CM

## 2015-07-11 DIAGNOSIS — Z538 Procedure and treatment not carried out for other reasons: Secondary | ICD-10-CM | POA: Diagnosis not present

## 2015-07-11 NOTE — Assessment & Plan Note (Signed)
Recent CXR and CT showed multifocal pneumonia. Symptoms improving after course of Augmentin and Azithromycin. Will get follow up CXR today. Follow up 4 weeks.

## 2015-07-11 NOTE — Patient Instructions (Addendum)
Chest xray today.  Follow up in 4 weeks. 

## 2015-07-11 NOTE — Progress Notes (Signed)
Subjective:    Patient ID: Paula Garrett, female    DOB: [birth year], 50 y.o.   MRN: [id number]  HPI  50YO female presents for follow up.  Recently seen for bronchitis. CT chest showed multifocal pneumonia. Treated with Augmentin and Azithromycin.  Feeling better. Coughing some, non-productive. No chest tightness. Temp at hospital yesterday 99.25F. She has been physically active walking without dyspnea.    Wt Readings from Last 3 Encounters:  07/11/15 262 lb 4 oz (118.956 kg)  06/26/15 265 lb (120.203 kg)  06/15/15 262 lb (118.842 kg)   BP Readings from Last 3 Encounters:  07/10/15 167/82  07/11/15 117/73  06/26/15 122/71    Past Medical History  Diagnosis Date  . Depression   . Arthritis   . GERD (gastroesophageal reflux disease)   . Thyroid disease   . Hypertension   . Anxiety   . Asthma    Family History  Problem Relation Age of Onset  . Heart disease Mother   . COPD Mother   . Arthritis Mother   . Cancer Mother     uterine  . Lupus Mother   . Anxiety disorder Mother   . Depression Mother   . Drug abuse Mother   . COPD Father   . Arthritis Father   . Heart disease Father   . Heart attack Father   . Alcohol abuse Father   . Diabetes Maternal Grandmother   . Diabetes Maternal Grandfather   . Diabetes Paternal Grandmother   . Diabetes Paternal Grandfather   . Heart disease Brother   . Heart attack Brother   . Drug abuse Brother   . Anxiety disorder Brother   . Depression Brother   . Heart disease Brother    Past Surgical History  Procedure Laterality Date  . Gastric bypass  2005  . Total thyroidectomy    . Tubal ligation    . Breast surgery      cyst aspiration  . Nasal sinus surgery    . Breast surgery  1998    cyst aspiration  . Ablation  2006  . Hernia repair    . Endometrial ablation    . Breast biopsy Left 1998  . Colonoscopy  03/2014   Social History   Social History  . Marital Status: Married    Spouse Name: N/A  . Number of  Children: N/A  . Years of Education: N/A   Social History Main Topics  . Smoking status: Current Every Day Smoker -- 1.00 packs/day for 30 years    Types: Cigarettes    Start date: 03/15/1980  . Smokeless tobacco: Never Used  . Alcohol Use: 13.2 oz/week    20 Glasses of wine, 0 Cans of beer, 0 Shots of liquor, 2 Standard drinks or equivalent per week  . Drug Use: No  . Sexual Activity:    Partners: Male   Other Topics Concern  . None   Social History Narrative   Lives in [place] with husband.      Work - [employer]      Diet - healthy diet   Exercise - limited   Caffeine use: daily    Review of Systems  Constitutional: Negative for fever, chills and unexpected weight change.  HENT: Negative for congestion, ear discharge, ear pain, facial swelling, hearing loss, mouth sores, nosebleeds, postnasal drip, rhinorrhea, sinus pressure, sneezing, sore throat, tinnitus, trouble swallowing and voice change.   Eyes: Negative for pain, discharge, redness and visual disturbance.  Respiratory: Positive for cough. Negative for chest tightness, shortness of breath, wheezing and stridor.   Cardiovascular: Negative for chest pain, palpitations and leg swelling.  Musculoskeletal: Negative for myalgias, arthralgias, neck pain and neck stiffness.  Skin: Negative for color change and rash.  Neurological: Negative for dizziness, weakness, light-headedness and headaches.  Hematological: Negative for adenopathy.       Objective:    BP 117/73 mmHg  Pulse 79  Temp(Src) 97.8 F (36.6 C) (Oral)  Ht 5\' 3"  (1.6 m)  Wt 262 lb 4 oz (118.956 kg)  BMI 46.47 kg/m2  SpO2 95% Physical Exam  Constitutional: She is oriented to person, place, and time. She appears well-developed and well-nourished. No distress.  HENT:  Head: Normocephalic and atraumatic.  Right Ear: External ear normal.  Left Ear: External ear normal.  Nose: Nose normal.  Mouth/Throat: Oropharynx is clear and moist. No  oropharyngeal exudate.  Eyes: Conjunctivae are normal. Pupils are equal, round, and reactive to light. Right eye exhibits no discharge. Left eye exhibits no discharge. No scleral icterus.  Neck: Normal range of motion. Neck supple. No tracheal deviation present. No thyromegaly present.  Cardiovascular: Normal rate, regular rhythm, normal heart sounds and intact distal pulses.  Exam reveals no gallop and no friction rub.   No murmur heard. Pulmonary/Chest: Effort normal. No accessory muscle usage. No tachypnea. No respiratory distress. She has no decreased breath sounds. She has wheezes (rare scattered). She has no rhonchi. She has no rales. She exhibits no tenderness.  Musculoskeletal: Normal range of motion. She exhibits no edema or tenderness.  Lymphadenopathy:    She has no cervical adenopathy.  Neurological: She is alert and oriented to person, place, and time. No cranial nerve deficit. She exhibits normal muscle tone. Coordination normal.  Skin: Skin is warm and dry. No rash noted. She is not diaphoretic. No erythema. No pallor.  Psychiatric: She has a normal mood and affect. Her behavior is normal. Judgment and thought content normal.          Assessment & Plan:   Problem List Items Addressed This Visit      Unprioritized   CAP (community acquired pneumonia) - Primary    Recent CXR and CT showed multifocal pneumonia. Symptoms improving after course of Augmentin and Azithromycin. Will get follow up CXR today. Follow up 4 weeks.      Relevant Orders   DG Chest 2 View       Return in about 4 weeks (around 08/08/2015) for Recheck.

## 2015-07-11 NOTE — Progress Notes (Signed)
Pre visit review using our clinic review tool, if applicable. No additional management support is needed unless otherwise documented below in the visit note. 

## 2015-07-14 ENCOUNTER — Ambulatory Visit (INDEPENDENT_AMBULATORY_CARE_PROVIDER_SITE_OTHER): Payer: 59 | Admitting: Psychiatry

## 2015-07-14 ENCOUNTER — Encounter: Payer: Self-pay | Admitting: Psychiatry

## 2015-07-14 VITALS — BP 156/98 | HR 84 | Resp 12 | Ht 63.0 in | Wt 263.0 lb

## 2015-07-14 DIAGNOSIS — F332 Major depressive disorder, recurrent severe without psychotic features: Secondary | ICD-10-CM | POA: Diagnosis not present

## 2015-07-14 MED ORDER — CLONAZEPAM 1 MG PO TABS: 1.0000 mg | ORAL_TABLET | Freq: Two times a day (BID) | ORAL | Status: DC

## 2015-07-14 NOTE — Patient Instructions (Signed)
Cognitive Behavioral Therapy (CBT)

## 2015-07-14 NOTE — Progress Notes (Signed)
BH MD/PA/NP OP Progress Note  07/14/2015 2:02 PM Paula Garrett  MRN:  [id number]  Subjective:  Patient returns for follow-up of her major depressive disorder and generalized anxiety disorder. He states things continue to go well for her. She states that her typical day is that she'll go to work, come home she might do some housework or clean. However she states her husband also does some of the housework and cleans. She states she is sleeping well. She does state one thing that she's noticed about herself or sometimes she'll think back to something in the past such as "i.e. her ex-husband" and then it stimulates strong emotions and she feels like she has difficulty letting go of the past issues. I discussed with her that perhaps therapy or even CBT might be helpful. Patient states she did go to therapy for a few times with a therapist and it was decided she did not need it. Patient is not interested in any therapy at this time.  Chief Complaint: Good  Visit Diagnosis:   No diagnosis found.  Past Medical History:  Past Medical History  Diagnosis Date  . Depression   . Arthritis   . GERD (gastroesophageal reflux disease)   . Thyroid disease   . Hypertension   . Anxiety   . Asthma     Past Surgical History  Procedure Laterality Date  . Gastric bypass  2005  . Total thyroidectomy    . Tubal ligation    . Breast surgery      cyst aspiration  . Nasal sinus surgery    . Breast surgery  1998    cyst aspiration  . Ablation  2006  . Hernia repair    . Endometrial ablation    . Breast biopsy Left 1998  . Colonoscopy  03/2014   Family History:  Family History  Problem Relation Age of Onset  . Heart disease Mother   . COPD Mother   . Arthritis Mother   . Cancer Mother     uterine  . Lupus Mother   . Anxiety disorder Mother   . Depression Mother   . Drug abuse Mother   . COPD Father   . Arthritis Father   . Heart disease Father   . Heart attack Father   . Alcohol abuse Father    . Diabetes Maternal Grandmother   . Diabetes Maternal Grandfather   . Diabetes Paternal Grandmother   . Diabetes Paternal Grandfather   . Heart disease Brother   . Heart attack Brother   . Drug abuse Brother   . Anxiety disorder Brother   . Depression Brother   . Heart disease Brother    Social History:  Social History   Social History  . Marital Status: Married    Spouse Name: N/A  . Number of Children: N/A  . Years of Education: N/A   Social History Main Topics  . Smoking status: Current Every Day Smoker -- 1.00 packs/day for 30 years    Types: Cigarettes    Start date: 03/15/1980  . Smokeless tobacco: Never Used  . Alcohol Use: 13.2 oz/week    20 Glasses of wine, 0 Cans of beer, 0 Shots of liquor, 2 Standard drinks or equivalent per week  . Drug Use: No  . Sexual Activity:    Partners: Male   Other Topics Concern  . Not on file   Social History Narrative   Lives in [place] with husband.      Work -  [employer] center      Diet - healthy diet   Exercise - limited   Caffeine use: daily   Additional History:   Assessment:   Musculoskeletal: Strength & Muscle Tone: within normal limits Gait & Station: normal Patient leans: N/A  Psychiatric Specialty Exam: HPI  Review of Systems  Musculoskeletal: Negative for joint pain.  Psychiatric/Behavioral: Negative for depression, suicidal ideas, hallucinations, memory loss and substance abuse. The patient is not nervous/anxious and does not have insomnia.   All other systems reviewed and are negative.   There were no vitals taken for this visit.There is no weight on file to calculate BMI.  General Appearance: Well Groomed  Eye Contact:  Good  Speech:  Normal Rate  Volume:  Normal  Mood:  Good  Affect:  joked about Probation officer leaving and her ending  up getting "shock treatment"  Thought Process:  Linear  Orientation:  Full (Time, Place, and Person)  Thought Content:  Negative  Suicidal Thoughts:  No  Homicidal  Thoughts:  No  Memory:  Immediate;   Good Recent;   Good Remote;   Good  Judgement:  Good  Insight:  Good  Psychomotor Activity:  Negative  Concentration:  Good  Recall:  Good  Fund of Knowledge: Good  Language: Good  Akathisia:  Negative  Handed:   AIMS (if indicated):  Done 05/16/15 and normal  Assets:  Communication Skills Desire for Improvement  ADL's:  Intact  Cognition: WNL  Sleep:  good   Is the patient at risk to self?  No. Has the patient been a risk to self in the past 6 months?  No. Has the patient been a risk to self within the distant past?  No. Is the patient a risk to others?  No. Has the patient been a risk to others in the past 6 months?  No. Has the patient been a risk to others within the distant past?  No.  Current Medications: Current Outpatient Prescriptions  Medication Sig Dispense Refill  . albuterol (PROVENTIL HFA;VENTOLIN HFA) 108 (90 Base) MCG/ACT inhaler Inhale 2 puffs into the lungs every 6 (six) hours as needed for wheezing or shortness of breath. 1 Inhaler 0  . cetirizine (ZYRTEC) 10 MG tablet Take by mouth.    . chlorpheniramine-HYDROcodone (TUSSIONEX PENNKINETIC ER) 10-8 MG/5ML SUER Take 5 mLs by mouth every 12 (twelve) hours as needed. 140 mL 0  . clonazePAM (KLONOPIN) 1 MG tablet Take 1 tablet (1 mg total) by mouth 2 (two) times daily. FILL IN MARCH 2017 60 tablet 2  . gabapentin (NEURONTIN) 100 MG capsule Take 1 capsule (100 mg total) by mouth 3 (three) times daily. 90 capsule 3  . lisinopril (PRINIVIL,ZESTRIL) 20 MG tablet Take 1 tablet (20 mg total) by mouth daily. 90 tablet 3  . pantoprazole (PROTONIX) 40 MG tablet Take 1 tablet (40 mg total) by mouth daily. 90 tablet 3  . QUEtiapine (SEROQUEL) 100 MG tablet Take 1 tablet (100 mg total) by mouth at bedtime. 30 tablet 4  . SYNTHROID 137 MCG tablet Take 2 tablets (274 mcg total) by mouth daily before breakfast. 60 tablet 3  . venlafaxine XR (EFFEXOR-XR) 150 MG 24 hr capsule Take 1 capsule  (150 mg total) by mouth every morning. 30 capsule 4   No current facility-administered medications for this visit.    Medical Decision Making:  Established Problem, Stable/Improving (1), Review of Medication Regimen & Side Effects (2) and Review of New Medication or Change in Dosage (2)  Treatment Plan Summary:Medication management and Plan   Major depressive disorder, recurrent, moderate-patient has been on Effexor XR 150 mg. She resumed Seroquel 100 mg at bedtime for augmentation and reports good benefit on this combination.  Generalized anxiety disorder-patient continue her Klonopin 1 mg twice a day as needed.   Patient will follow up in 3 month.is aware of my departure next month and that she will follow up with another provider within this clinic. He is been encouraged to call any questions or concerns prior to her next appointment.  Faith Rogue 07/14/2015, 2:02 PM

## 2015-08-08 ENCOUNTER — Encounter: Payer: Self-pay | Admitting: Internal Medicine

## 2015-08-08 ENCOUNTER — Ambulatory Visit (INDEPENDENT_AMBULATORY_CARE_PROVIDER_SITE_OTHER): Payer: Managed Care, Other (non HMO) | Admitting: Internal Medicine

## 2015-08-08 VITALS — BP 140/77 | HR 90 | Temp 97.5°F | Ht 63.0 in | Wt 266.2 lb

## 2015-08-08 DIAGNOSIS — R5382 Chronic fatigue, unspecified: Secondary | ICD-10-CM | POA: Diagnosis not present

## 2015-08-08 DIAGNOSIS — J189 Pneumonia, unspecified organism: Secondary | ICD-10-CM

## 2015-08-08 MED ORDER — CYANOCOBALAMIN 1000 MCG/ML IJ SOLN
1000.0000 ug | Freq: Once | INTRAMUSCULAR | Status: AC
  Administered 2015-08-08: 1000 ug via INTRAMUSCULAR

## 2015-08-08 NOTE — Progress Notes (Signed)
Pre visit review using our clinic review tool, if applicable. No additional management support is needed unless otherwise documented below in the visit note. 

## 2015-08-08 NOTE — Progress Notes (Signed)
Subjective:    Patient ID: Paula Garrett, female    DOB: 1966/05/01, 50 y.o.   MRN: [id number]  HPI  51YO female presents for follow up.  Breathing improved with no dyspnea. Occasional dry cough productive of thick phlegm, however improving. No fever. Not taking any medication for cough. Generally feeling well. No new concerns.   Wt Readings from Last 3 Encounters:  08/08/15 266 lb 4 oz (120.77 kg)  07/14/15 263 lb (119.296 kg)  07/11/15 262 lb 4 oz (118.956 kg)   BP Readings from Last 3 Encounters:  08/08/15 140/77  07/14/15 156/98  07/11/15 117/73    Past Medical History  Diagnosis Date  . Depression   . Arthritis   . GERD (gastroesophageal reflux disease)   . Thyroid disease   . Hypertension   . Anxiety   . Asthma    Family History  Problem Relation Age of Onset  . Heart disease Mother   . COPD Mother   . Arthritis Mother   . Cancer Mother     uterine  . Lupus Mother   . Anxiety disorder Mother   . Depression Mother   . Drug abuse Mother   . COPD Father   . Arthritis Father   . Heart disease Father   . Heart attack Father   . Alcohol abuse Father   . Diabetes Maternal Grandmother   . Diabetes Maternal Grandfather   . Diabetes Paternal Grandmother   . Diabetes Paternal Grandfather   . Heart disease Brother   . Heart attack Brother   . Drug abuse Brother   . Anxiety disorder Brother   . Depression Brother   . Heart disease Brother    Past Surgical History  Procedure Laterality Date  . Gastric bypass  2005  . Total thyroidectomy    . Tubal ligation    . Breast surgery      cyst aspiration  . Nasal sinus surgery    . Breast surgery  1998    cyst aspiration  . Ablation  2006  . Hernia repair    . Endometrial ablation    . Breast biopsy Left 1998  . Colonoscopy  03/2014   Social History   Social History  . Marital Status: Married    Spouse Name: N/A  . Number of Children: N/A  . Years of Education: N/A   Social History Main Topics    . Smoking status: Current Every Day Smoker -- 1.00 packs/day for 30 years    Types: Cigarettes    Start date: 03/15/1980  . Smokeless tobacco: Never Used  . Alcohol Use: 13.2 oz/week    20 Glasses of wine, 0 Cans of beer, 0 Shots of liquor, 2 Standard drinks or equivalent per week  . Drug Use: No  . Sexual Activity:    Partners: Male   Other Topics Concern  . None   Social History Narrative   Lives in [place] with husband.      Work - [employer] center      Diet - healthy diet   Exercise - limited   Caffeine use: daily    Review of Systems  Constitutional: Negative for fever, chills and unexpected weight change.  HENT: Negative for congestion, ear discharge, ear pain, facial swelling, hearing loss, mouth sores, nosebleeds, postnasal drip, rhinorrhea, sinus pressure, sneezing, sore throat, tinnitus, trouble swallowing and voice change.   Eyes: Negative for pain, discharge, redness and visual disturbance.  Respiratory: Positive for cough. Negative for  chest tightness, shortness of breath, wheezing and stridor.   Cardiovascular: Negative for chest pain, palpitations and leg swelling.  Musculoskeletal: Negative for myalgias, arthralgias, neck pain and neck stiffness.  Skin: Negative for color change and rash.  Neurological: Negative for dizziness, weakness, light-headedness and headaches.  Hematological: Negative for adenopathy.       Objective:    BP 140/77 mmHg  Pulse 90  Temp(Src) 97.5 F (36.4 C) (Oral)  Ht 5\' 3"  (1.6 m)  Wt 266 lb 4 oz (120.77 kg)  BMI 47.18 kg/m2  SpO2 94% Physical Exam  Constitutional: She is oriented to person, place, and time. She appears well-developed and well-nourished. No distress.  HENT:  Head: Normocephalic and atraumatic.  Right Ear: External ear normal.  Left Ear: External ear normal.  Nose: Nose normal.  Mouth/Throat: Oropharynx is clear and moist.  Eyes: Conjunctivae are normal. Pupils are equal, round, and reactive to light. Right  eye exhibits no discharge. Left eye exhibits no discharge. No scleral icterus.  Neck: Normal range of motion. Neck supple. No tracheal deviation present. No thyromegaly present.  Cardiovascular: Normal rate, regular rhythm, normal heart sounds and intact distal pulses.  Exam reveals no gallop and no friction rub.   No murmur heard. Pulmonary/Chest: Effort normal and breath sounds normal. No respiratory distress. She has no wheezes. She has no rales. She exhibits no tenderness.  Musculoskeletal: Normal range of motion. She exhibits no edema or tenderness.  Lymphadenopathy:    She has no cervical adenopathy.  Neurological: She is alert and oriented to person, place, and time. No cranial nerve deficit. She exhibits normal muscle tone. Coordination normal.  Skin: Skin is warm and dry. No rash noted. She is not diaphoretic. No erythema. No pallor.  Psychiatric: She has a normal mood and affect. Her behavior is normal. Judgment and thought content normal.          Assessment & Plan:   Problem List Items Addressed This Visit      Unprioritized   CAP (community acquired pneumonia) - Primary    Symptoms improving.  Exam normal.CXR showed near complete resolution of infiltrate. Will continue to monitor. Follow up in 2months and prn.          Return in about 3 months (around 11/05/2015) for Recheck.

## 2015-08-08 NOTE — Addendum Note (Signed)
Addended by: Nanci Pina on: 08/08/2015 11:14 AM   Modules accepted: Orders

## 2015-08-08 NOTE — Assessment & Plan Note (Signed)
Symptoms improving.  Exam normal.CXR showed near complete resolution of infiltrate. Will continue to monitor. Follow up in 42months and prn.

## 2015-08-14 ENCOUNTER — Encounter: Payer: Self-pay | Admitting: *Deleted

## 2015-08-15 ENCOUNTER — Ambulatory Visit: Payer: Managed Care, Other (non HMO) | Admitting: Certified Registered Nurse Anesthetist

## 2015-08-15 ENCOUNTER — Ambulatory Visit
Admission: RE | Admit: 2015-08-15 | Discharge: 2015-08-15 | Disposition: A | Discharge status: Home / Self Care | Payer: Managed Care, Other (non HMO) | Source: Ambulatory Visit | Attending: Gastroenterology | Admitting: Gastroenterology

## 2015-08-15 ENCOUNTER — Encounter
Admission: RE | Disposition: A | Discharge status: Home / Self Care | Payer: Self-pay | Source: Ambulatory Visit | Attending: Gastroenterology

## 2015-08-15 DIAGNOSIS — E039 Hypothyroidism, unspecified: Secondary | ICD-10-CM | POA: Insufficient documentation

## 2015-08-15 DIAGNOSIS — F419 Anxiety disorder, unspecified: Secondary | ICD-10-CM | POA: Insufficient documentation

## 2015-08-15 DIAGNOSIS — K219 Gastro-esophageal reflux disease without esophagitis: Secondary | ICD-10-CM | POA: Insufficient documentation

## 2015-08-15 DIAGNOSIS — F17210 Nicotine dependence, cigarettes, uncomplicated: Secondary | ICD-10-CM | POA: Insufficient documentation

## 2015-08-15 DIAGNOSIS — K2270 Barrett's esophagus without dysplasia: Secondary | ICD-10-CM | POA: Insufficient documentation

## 2015-08-15 DIAGNOSIS — I10 Essential (primary) hypertension: Secondary | ICD-10-CM | POA: Insufficient documentation

## 2015-08-15 DIAGNOSIS — Z79899 Other long term (current) drug therapy: Secondary | ICD-10-CM | POA: Insufficient documentation

## 2015-08-15 HISTORY — DX: Hypothyroidism, unspecified: E03.9

## 2015-08-15 HISTORY — PX: ESOPHAGOGASTRODUODENOSCOPY (EGD) WITH PROPOFOL: SHX5813

## 2015-08-15 HISTORY — DX: Pneumonia, unspecified organism: J18.9

## 2015-08-15 SURGERY — ESOPHAGOGASTRODUODENOSCOPY (EGD) WITH PROPOFOL
Anesthesia: General

## 2015-08-15 MED ORDER — PROPOFOL 500 MG/50ML IV EMUL
INTRAVENOUS | Status: DC | PRN
  Administered 2015-08-15: 140 ug/kg/min via INTRAVENOUS

## 2015-08-15 MED ORDER — LIDOCAINE HCL (CARDIAC) 20 MG/ML IV SOLN
INTRAVENOUS | Status: DC | PRN
  Administered 2015-08-15: 30 mg via INTRAVENOUS
  Administered 2015-08-15: 60 mg via INTRAVENOUS

## 2015-08-15 MED ORDER — SODIUM CHLORIDE 0.9 % IV SOLN
INTRAVENOUS | Status: DC
Start: 2015-08-15 — End: 2015-08-15
  Administered 2015-08-15: 1000 mL via INTRAVENOUS

## 2015-08-15 MED ORDER — MIDAZOLAM HCL 2 MG/2ML IJ SOLN
INTRAMUSCULAR | Status: DC | PRN
  Administered 2015-08-15: 1 mg via INTRAVENOUS

## 2015-08-15 MED ORDER — SODIUM CHLORIDE 0.9 % IV SOLN: INTRAVENOUS | Status: DC

## 2015-08-15 MED ORDER — PROPOFOL 10 MG/ML IV BOLUS
INTRAVENOUS | Status: DC | PRN
  Administered 2015-08-15 (×2): 30 mg via INTRAVENOUS
  Administered 2015-08-15: 20 mg via INTRAVENOUS
  Administered 2015-08-15 (×3): 30 mg via INTRAVENOUS
  Administered 2015-08-15: 20 mg via INTRAVENOUS
  Administered 2015-08-15: 30 mg via INTRAVENOUS

## 2015-08-15 MED ORDER — GLYCOPYRROLATE 0.2 MG/ML IJ SOLN
INTRAMUSCULAR | Status: DC | PRN
  Administered 2015-08-15: 0.2 mg via INTRAVENOUS

## 2015-08-15 NOTE — Anesthesia Postprocedure Evaluation (Signed)
Anesthesia Post Note  Patient: Paula Garrett  Procedure(s) Performed: Procedure(s) (LRB): ESOPHAGOGASTRODUODENOSCOPY (EGD) WITH PROPOFOL (N/A)  Patient location during evaluation: PACU Anesthesia Type: General Level of consciousness: awake and alert Pain management: pain level controlled Vital Signs Assessment: post-procedure vital signs reviewed and stable Respiratory status: spontaneous breathing, nonlabored ventilation, respiratory function stable and patient connected to nasal cannula oxygen Cardiovascular status: blood pressure returned to baseline and stable Postop Assessment: no signs of nausea or vomiting Anesthetic complications: no    Last Vitals:  Filed Vitals:   08/15/15 1318 08/15/15 1328  BP: 121/60 111/58  Pulse: 77 70  Temp:    Resp: 21 21    Last Pain: There were no vitals filed for this visit.               Molli Barrows

## 2015-08-15 NOTE — Anesthesia Procedure Notes (Signed)
Date/Time: 08/15/2015 12:26 PM Performed by: Johnna Acosta Pre-anesthesia Checklist: Patient identified, Emergency Drugs available, Suction available, Patient being monitored and Timeout performed Patient Re-evaluated:Patient Re-evaluated prior to inductionOxygen Delivery Method: Nasal cannula

## 2015-08-15 NOTE — H&P (Signed)
Outpatient short stay form Pre-procedure 08/15/2015 12:23 PM Paula Sails MD  Primary Physician: Dr. Ronette Deter  Reason for visit:  EGD  History of present illness:  Patient is a 50 year old female presenting today for EGD in follow-up of her history of Barrett's esophagus. This was seen initially on EGD about 2 years ago. She does have a history of gastric bypass. She currently takes Protonix daily. She denies any heartburn or dysphagia.    Current facility-administered medications:  .  0.9 %  sodium chloride infusion, , Intravenous, Continuous, Paula Sails, MD, Last Rate: 20 mL/hr at 08/15/15 1207, 1,000 mL at 08/15/15 1207 .  0.9 %  sodium chloride infusion, , Intravenous, Continuous, Paula Sails, MD  Prescriptions prior to admission  Medication Sig Dispense Refill Last Dose  . albuterol (PROVENTIL HFA;VENTOLIN HFA) 108 (90 Base) MCG/ACT inhaler Inhale 2 puffs into the lungs every 6 (six) hours as needed for wheezing or shortness of breath. 1 Inhaler 0 Taking  . cetirizine (ZYRTEC) 10 MG tablet Take by mouth.   Taking  . clonazePAM (KLONOPIN) 1 MG tablet Take 1 tablet (1 mg total) by mouth 2 (two) times daily. FILL IN MARCH 2017 60 tablet 2 Taking  . gabapentin (NEURONTIN) 100 MG capsule Take 1 capsule (100 mg total) by mouth 3 (three) times daily. 90 capsule 3 Taking  . lisinopril (PRINIVIL,ZESTRIL) 20 MG tablet Take 1 tablet (20 mg total) by mouth daily. 90 tablet 3 Taking  . pantoprazole (PROTONIX) 40 MG tablet Take 1 tablet (40 mg total) by mouth daily. 90 tablet 3 Taking  . QUEtiapine (SEROQUEL) 100 MG tablet Take 1 tablet (100 mg total) by mouth at bedtime. 30 tablet 4 Taking  . SYNTHROID 137 MCG tablet Take 2 tablets (274 mcg total) by mouth daily before breakfast. 60 tablet 3 Taking  . venlafaxine XR (EFFEXOR-XR) 150 MG 24 hr capsule Take 1 capsule (150 mg total) by mouth every morning. 30 capsule 4 Taking     No Known Allergies   Past Medical History   Diagnosis Date  . Depression   . Arthritis   . GERD (gastroesophageal reflux disease)   . Thyroid disease   . Hypertension   . Anxiety   . Asthma   . Hypothyroidism   . Pneumonia     Review of systems:      Physical Exam    Heart and lungs: Regular rate and rhythm without rub or gallop, lungs are bilaterally clear.    HEENT: Normocephalic atraumatic eyes are anicteric    Other:     Pertinant exam for procedure: Soft nontender nondistended bowel sounds positive normoactive.    Planned proceedures: EGD and indicated procedures. I have discussed the risks benefits and complications of procedures to include not limited to bleeding, infection, perforation and the risk of sedation and the patient wishes to proceed.    Paula Sails, MD Gastroenterology 08/15/2015  12:23 PM

## 2015-08-15 NOTE — Anesthesia Preprocedure Evaluation (Signed)
Anesthesia Evaluation  Patient identified by MRN, date of birth, ID band Patient awake    Reviewed: Allergy & Precautions, H&P , NPO status , Patient's Chart, lab work & pertinent test results, reviewed documented beta blocker date and time   Airway Mallampati: II   Neck ROM: full    Dental  (+) Poor Dentition   Pulmonary neg pulmonary ROS, asthma , resolved, Current Smoker,    Pulmonary exam normal        Cardiovascular hypertension, negative cardio ROS Normal cardiovascular exam     Neuro/Psych  Headaches, PSYCHIATRIC DISORDERS negative psych ROS   GI/Hepatic Neg liver ROS, GERD  ,  Endo/Other  Hypothyroidism   Renal/GU negative Renal ROS  negative genitourinary   Musculoskeletal   Abdominal   Peds  Hematology negative hematology ROS (+)   Anesthesia Other Findings Past Medical History:   Depression                                                   Arthritis                                                    GERD (gastroesophageal reflux disease)                       Thyroid disease                                              Hypertension                                                 Anxiety                                                      Asthma                                                       Hypothyroidism                                               Pneumonia                                                  Past Surgical History:   GASTRIC BYPASS  2005         TOTAL THYROIDECTOMY                                           TUBAL LIGATION                                                BREAST SURGERY                                                  Comment:cyst aspiration   NASAL SINUS SURGERY                                           BREAST SURGERY                                   1998           Comment:cyst aspiration   ABLATION                                          2006         HERNIA REPAIR                                                 ENDOMETRIAL ABLATION                                          BREAST BIOPSY                                   Left 1998         COLONOSCOPY                                      03/2014      Reproductive/Obstetrics                             Anesthesia Physical Anesthesia Plan  ASA: III  Anesthesia Plan: General   Post-op Pain Management:    Induction:   Airway Management Planned:   Additional Equipment:   Intra-op Plan:   Post-operative Plan:   Informed Consent: I have reviewed the patients History and Physical, chart, labs and discussed the procedure including the risks, benefits and alternatives for the proposed anesthesia with the patient or authorized representative who has indicated his/her understanding and acceptance.   Dental Advisory Given  Plan Discussed with: CRNA  Anesthesia Plan Comments:         Anesthesia Quick Evaluation

## 2015-08-15 NOTE — Transfer of Care (Signed)
Immediate Anesthesia Transfer of Care Note  Patient: Paula Garrett  Procedure(s) Performed: Procedure(s): ESOPHAGOGASTRODUODENOSCOPY (EGD) WITH PROPOFOL (N/A)  Patient Location: PACU  Anesthesia Type:General  Level of Consciousness: sedated  Airway & Oxygen Therapy: Patient Spontanous Breathing and Patient connected to nasal cannula oxygen  Post-op Assessment: Report given to RN and Post -op Vital signs reviewed and stable  Post vital signs: Reviewed and stable  Last Vitals:  Filed Vitals:   08/15/15 1258 08/15/15 1259  BP: 103/57 103/57  Pulse: 88 91  Temp: 36.3 C 36.3 C  Resp: 20 16    Complications: No apparent anesthesia complications

## 2015-08-15 NOTE — Op Note (Signed)
[facility] Gastroenterology Patient Name: Paula Garrett Procedure Date: 08/15/2015 11:51 AM MRN: [id number] Account #: [id number] Date of Birth: 03/14/1966 Admit Type: Outpatient Age: 50 Room: [facility] ENDO ROOM 3 Gender: Female Note Status: Finalized Procedure:            Upper GI endoscopy Indications:          Follow-up of Barrett's esophagus Providers:            Lollie Sails, MD Referring MD:         Eduard Clos. Gilford Rile, MD (Referring MD) Medicines:            Monitored Anesthesia Care Complications:        No immediate complications. Procedure:            Pre-Anesthesia Assessment:                       - ASA Grade Assessment: III - A patient with severe                        systemic disease.                       After obtaining informed consent, the endoscope was                        passed under direct vision. Throughout the procedure,                        the patient's blood pressure, pulse, and oxygen                        saturations were monitored continuously. The procedure                        was aborted. The scope was not inserted. Medications                        were given. The upper GI endoscopy was extremely                        difficult due to patient intolerance of esophageal                        intubation. Findings:      Multiple efforts were made to introduce the scope , unsuccessfully.       [name] would couch with introduction of the scope into the       hypopharyngeal area, and would not allow introduction of the scope into       the esophagus. Impression:           - No specimens collected. 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 Recommendation:       - Discharge patient to home.                       - Await repeat proceedure of about 6 weeks, consider                        steroid meds  before with inhalers. Will discuss with Dr                        Ronette Deter. 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 Number of Addenda: 0 Note Initiated On: 08/15/2015 11:51 AM      [facility]

## 2015-08-24 ENCOUNTER — Encounter: Payer: Self-pay | Admitting: Internal Medicine

## 2015-09-02 ENCOUNTER — Other Ambulatory Visit: Payer: Self-pay | Admitting: Internal Medicine

## 2015-09-30 ENCOUNTER — Other Ambulatory Visit: Payer: Self-pay | Admitting: Internal Medicine

## 2015-10-24 ENCOUNTER — Encounter: Payer: Self-pay | Admitting: Internal Medicine

## 2015-10-24 ENCOUNTER — Ambulatory Visit (INDEPENDENT_AMBULATORY_CARE_PROVIDER_SITE_OTHER): Payer: Managed Care, Other (non HMO) | Admitting: Internal Medicine

## 2015-10-24 VITALS — BP 130/84 | HR 83 | Temp 98.1°F | Resp 14 | Ht 63.0 in | Wt 264.4 lb

## 2015-10-24 DIAGNOSIS — M2550 Pain in unspecified joint: Secondary | ICD-10-CM

## 2015-10-24 DIAGNOSIS — K22719 Barrett's esophagus with dysplasia, unspecified: Secondary | ICD-10-CM

## 2015-10-24 DIAGNOSIS — R768 Other specified abnormal immunological findings in serum: Secondary | ICD-10-CM | POA: Diagnosis not present

## 2015-10-24 DIAGNOSIS — R5382 Chronic fatigue, unspecified: Secondary | ICD-10-CM | POA: Diagnosis not present

## 2015-10-24 DIAGNOSIS — J209 Acute bronchitis, unspecified: Secondary | ICD-10-CM | POA: Insufficient documentation

## 2015-10-24 MED ORDER — BUDESONIDE-FORMOTEROL FUMARATE 80-4.5 MCG/ACT IN AERO: 2.0000 | INHALATION_SPRAY | Freq: Two times a day (BID) | RESPIRATORY_TRACT | Status: DC

## 2015-10-24 MED ORDER — PREDNISONE 10 MG PO TABS: ORAL_TABLET | ORAL | Status: DC

## 2015-10-24 MED ORDER — AMOXICILLIN-POT CLAVULANATE 875-125 MG PO TABS: 1.0000 | ORAL_TABLET | Freq: Two times a day (BID) | ORAL | Status: DC

## 2015-10-24 NOTE — Progress Notes (Signed)
Subjective:    Patient ID: Paula Garrett, female    DOB: [birth year], 50 y.o.   MRN: [id number]  HPI  50YO female presents for follow up.  Having worsening joint pain in knees bilaterally. Taking Neurontin only at night because it makes her tired. Worse with physical activity. Improved some with rest. No swelling noted.  Feels tired all the time. Even with full night sleep. No focal symptoms other than arthralgia. Has not yet set up sleep study.  Some worsening dyspnea over the last week. Last week had some cough. No fever or chest pain. Notes increased smoking last week.   Wt Readings from Last 3 Encounters:  10/24/15 264 lb 6.4 oz (119.931 kg)  08/08/15 266 lb 4 oz (120.77 kg)  07/14/15 263 lb (119.296 kg)   BP Readings from Last 3 Encounters:  10/24/15 130/84  08/15/15 111/58  08/08/15 140/77    Past Medical History  Diagnosis Date  . Depression   . Arthritis   . GERD (gastroesophageal reflux disease)   . Thyroid disease   . Hypertension   . Anxiety   . Asthma   . Hypothyroidism   . Pneumonia    Family History  Problem Relation Age of Onset  . Heart disease Mother   . COPD Mother   . Arthritis Mother   . Cancer Mother     uterine  . Lupus Mother   . Anxiety disorder Mother   . Depression Mother   . Drug abuse Mother   . COPD Father   . Arthritis Father   . Heart disease Father   . Heart attack Father   . Alcohol abuse Father   . Diabetes Maternal Grandmother   . Diabetes Maternal Grandfather   . Diabetes Paternal Grandmother   . Diabetes Paternal Grandfather   . Heart disease Brother   . Heart attack Brother   . Drug abuse Brother   . Anxiety disorder Brother   . Depression Brother   . Heart disease Brother    Past Surgical History  Procedure Laterality Date  . Gastric bypass  2005  . Total thyroidectomy    . Tubal ligation    . Breast surgery      cyst aspiration  . Nasal sinus surgery    . Breast surgery  1998    cyst aspiration  .  Ablation  2006  . Hernia repair    . Endometrial ablation    . Breast biopsy Left 1998  . Colonoscopy  03/2014  . Esophagogastroduodenoscopy (egd) with propofol N/A 08/15/2015    Procedure: ESOPHAGOGASTRODUODENOSCOPY (EGD) WITH PROPOFOL;  Surgeon: Lollie Sails, MD;  Location: [facility] ENDOSCOPY;  Service: Endoscopy;  Laterality: N/A;   Social History   Social History  . Marital Status: Married    Spouse Name: N/A  . Number of Children: N/A  . Years of Education: N/A   Social History Main Topics  . Smoking status: Current Every Day Smoker -- 1.00 packs/day for 30 years    Types: Cigarettes    Start date: 03/15/1980  . Smokeless tobacco: Never Used  . Alcohol Use: 13.2 oz/week    20 Glasses of wine, 0 Cans of beer, 0 Shots of liquor, 2 Standard drinks or equivalent per week  . Drug Use: No  . Sexual Activity:    Partners: Male   Other Topics Concern  . None   Social History Narrative   Lives in [place] with husband.      Work -  [employer] center      Diet - healthy diet   Exercise - limited   Caffeine use: daily    Review of Systems  Constitutional: Positive for fatigue. Negative for fever, chills, appetite change and unexpected weight change.  HENT: Negative for congestion, postnasal drip, rhinorrhea, sinus pressure, sneezing, sore throat, trouble swallowing and voice change.   Eyes: Negative for visual disturbance.  Respiratory: Positive for cough, chest tightness, shortness of breath and wheezing.   Cardiovascular: Negative for chest pain, palpitations and leg swelling.  Gastrointestinal: Negative for abdominal pain, diarrhea and constipation.  Musculoskeletal: Positive for arthralgias.  Skin: Negative for color change and rash.  Neurological: Negative for weakness and headaches.  Hematological: Negative for adenopathy. Does not bruise/bleed easily.  Psychiatric/Behavioral: Negative for suicidal ideas, sleep disturbance and dysphoric mood. The patient is not  nervous/anxious.        Objective:    BP 130/84 mmHg  Pulse 83  Temp(Src) 98.1 F (36.7 C) (Oral)  Resp 14  Ht 5\' 3"  (1.6 m)  Wt 264 lb 6.4 oz (119.931 kg)  BMI 46.85 kg/m2  SpO2 94% Physical Exam  Constitutional: She is oriented to person, place, and time. She appears well-developed and well-nourished. No distress.  HENT:  Head: Normocephalic and atraumatic.  Right Ear: External ear normal.  Left Ear: External ear normal.  Nose: Nose normal.  Mouth/Throat: Oropharynx is clear and moist. No oropharyngeal exudate.  Eyes: Conjunctivae are normal. Pupils are equal, round, and reactive to light. Right eye exhibits no discharge. Left eye exhibits no discharge. No scleral icterus.  Neck: Normal range of motion. Neck supple. No tracheal deviation present. No thyromegaly present.  Cardiovascular: Normal rate, regular rhythm, normal heart sounds and intact distal pulses.  Exam reveals no gallop and no friction rub.   No murmur heard. Pulmonary/Chest: Effort normal. No accessory muscle usage. No tachypnea. No respiratory distress. She has decreased breath sounds. She has wheezes (scattered). She has rhonchi (scattered). She has no rales. She exhibits no tenderness.  Musculoskeletal: Normal range of motion. She exhibits no edema or tenderness.  Lymphadenopathy:    She has no cervical adenopathy.  Neurological: She is alert and oriented to person, place, and time. No cranial nerve deficit. She exhibits normal muscle tone. Coordination normal.  Skin: Skin is warm and dry. No rash noted. She is not diaphoretic. No erythema. No pallor.  Psychiatric: She has a normal mood and affect. Her behavior is normal. Judgment and thought content normal.          Assessment & Plan:   Problem List Items Addressed This Visit      Unprioritized   Acute bronchitis - Primary    Symptoms consistent with bronchitis. Will start Augmentin and Prednisone taper. Start Symbicort and continue prn Albuterol.  Recommended smoking cessation and pulmonary evaluation, which she declines for now.      Relevant Medications   predniSONE (DELTASONE) 10 MG tablet   Arthralgia    Chronic arthralgia with worsening bilateral knee pain. Starting prednisone taper. RF was pos. Suspect RA contributing to symptoms. Will set up rheumatology evaluation.      Relevant Orders   Ambulatory referral to Rheumatology   Barrett's esophagus with dysplasia    Discussed with Dr. Gustavo Lah yesterday. They have attempted EGD on 2 occasions. The first occasion, she had residual respiratory issues from pneumonia, and EGD was canceled. Second attempt she failed because of increased gagging and failure to pass tube. She is a smoker with likely  COPD and OSA that has been untreated (she has cancelled sleep studies). Will start Symbicort and Prednisone taper as well as Augmentin in effort to improve symptoms prior to upcoming endoscopy.       Chronic fatigue    Chronic fatigue. She is at high risk for OSA. She has postponed scheduled sleep study. Encouraged her to reschedule this.      Rheumatoid factor positive    Chronic arthralgia. RF pos. Will set up rheumatology evaluation.      Relevant Orders   Ambulatory referral to Rheumatology       Return in about 4 weeks (around 11/21/2015) for Recheck.  Ronette Deter, MD Internal Medicine [facility]

## 2015-10-24 NOTE — Assessment & Plan Note (Signed)
Chronic arthralgia. RF pos. Will set up rheumatology evaluation.

## 2015-10-24 NOTE — Assessment & Plan Note (Signed)
Symptoms consistent with bronchitis. Will start Augmentin and Prednisone taper. Start Symbicort and continue prn Albuterol. Recommended smoking cessation and pulmonary evaluation, which she declines for now.

## 2015-10-24 NOTE — Assessment & Plan Note (Signed)
Chronic fatigue. She is at high risk for OSA. She has postponed scheduled sleep study. Encouraged her to reschedule this.

## 2015-10-24 NOTE — Assessment & Plan Note (Signed)
Discussed with Dr. Gustavo Lah yesterday. They have attempted EGD on 2 occasions. The first occasion, she had residual respiratory issues from pneumonia, and EGD was canceled. Second attempt she failed because of increased gagging and failure to pass tube. She is a smoker with likely COPD and OSA that has been untreated (she has cancelled sleep studies). Will start Symbicort and Prednisone taper as well as Augmentin in effort to improve symptoms prior to upcoming endoscopy.

## 2015-10-24 NOTE — Patient Instructions (Signed)
Start Symbicort twice daily.  Start Prednisone taper.  Start Augmentin twice daily.  Use Albuterol as needed for shortness of breath.  Call [facility] to reschedule endoscopy.  We will set up rheumatology evaluation.  Consider pulmonary evaluation in the future.

## 2015-10-24 NOTE — Assessment & Plan Note (Signed)
Chronic arthralgia with worsening bilateral knee pain. Starting prednisone taper. RF was pos. Suspect RA contributing to symptoms. Will set up rheumatology evaluation.

## 2015-10-26 ENCOUNTER — Ambulatory Visit: Payer: PRIVATE HEALTH INSURANCE | Admitting: Psychiatry

## 2015-11-08 ENCOUNTER — Ambulatory Visit (INDEPENDENT_AMBULATORY_CARE_PROVIDER_SITE_OTHER): Payer: Managed Care, Other (non HMO) | Admitting: Internal Medicine

## 2015-11-08 ENCOUNTER — Encounter: Payer: Self-pay | Admitting: Internal Medicine

## 2015-11-08 VITALS — BP 138/82 | HR 78 | Ht 63.0 in | Wt 267.0 lb

## 2015-11-08 DIAGNOSIS — F329 Major depressive disorder, single episode, unspecified: Secondary | ICD-10-CM

## 2015-11-08 DIAGNOSIS — J209 Acute bronchitis, unspecified: Secondary | ICD-10-CM

## 2015-11-08 DIAGNOSIS — F418 Other specified anxiety disorders: Secondary | ICD-10-CM | POA: Diagnosis not present

## 2015-11-08 DIAGNOSIS — Z720 Tobacco use: Secondary | ICD-10-CM

## 2015-11-08 DIAGNOSIS — F419 Anxiety disorder, unspecified: Secondary | ICD-10-CM

## 2015-11-08 MED ORDER — PREDNISONE 10 MG PO TABS: ORAL_TABLET | ORAL | Status: DC

## 2015-11-08 NOTE — Assessment & Plan Note (Signed)
Encouraged smoking cessation. Discussed limitations of medical therapy with ongoing smoking.

## 2015-11-08 NOTE — Progress Notes (Signed)
Subjective:    Patient ID: Paula Garrett, female    DOB: 1966-01-09, 50 y.o.   MRN: [id number]  HPI  50YO female presents for follow up.  Recently treated for bronchitis with Prednisone and Augmentin. Continues to use Symbicort. Not using Albuterol.  Continues to have cough. Some chest tightness and wheezing at times. Was outside smoking for several hours last night.  Evaluation with rheumatology pending.  Tough week for her. Increased stress at work. 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 Difficult for her. Infant died. Taking Clonazepam prn to help with extreme anxiety. Worried about upcoming psychiatry appt.  Wt Readings from Last 3 Encounters:  11/08/15 267 lb (121.11 kg)  10/24/15 264 lb 6.4 oz (119.931 kg)  08/08/15 266 lb 4 oz (120.77 kg)   BP Readings from Last 3 Encounters:  11/08/15 138/82  10/24/15 130/84  08/15/15 111/58    Past Medical History  Diagnosis Date  . Depression   . Arthritis   . GERD (gastroesophageal reflux disease)   . Thyroid disease   . Hypertension   . Anxiety   . Asthma   . Hypothyroidism   . Pneumonia    Family History  Problem Relation Age of Onset  . Heart disease Mother   . COPD Mother   . Arthritis Mother   . Cancer Mother     uterine  . Lupus Mother   . Anxiety disorder Mother   . Depression Mother   . Drug abuse Mother   . COPD Father   . Arthritis Father   . Heart disease Father   . Heart attack Father   . Alcohol abuse Father   . Diabetes Maternal Grandmother   . Diabetes Maternal Grandfather   . Diabetes Paternal Grandmother   . Diabetes Paternal Grandfather   . Heart disease Brother   . Heart attack Brother   . Drug abuse Brother   . Anxiety disorder Brother   . Depression Brother   . Heart disease Brother    Past Surgical History  Procedure Laterality Date  . Gastric bypass  2005  . Total thyroidectomy    . Tubal ligation    . Breast surgery      cyst  aspiration  . Nasal sinus surgery    . Breast surgery  1998    cyst aspiration  . Ablation  2006  . Hernia repair    . Endometrial ablation    . Breast biopsy Left 1998  . Colonoscopy  03/2014  . Esophagogastroduodenoscopy (egd) with propofol N/A 08/15/2015    Procedure: ESOPHAGOGASTRODUODENOSCOPY (EGD) WITH PROPOFOL;  Surgeon: Lollie Sails, MD;  Location: [facility] ENDOSCOPY;  Service: Endoscopy;  Laterality: N/A;   Social History   Social History  . Marital Status: Married    Spouse Name: N/A  . Number of Children: N/A  . Years of Education: N/A   Social History Main Topics  . Smoking status: Current Every Day Smoker -- 1.00 packs/day for 30 years    Types: Cigarettes    Start date: 03/15/1980  . Smokeless tobacco: Never Used  . Alcohol Use: 13.2 oz/week    20 Glasses of wine, 0 Cans of beer, 0 Shots of liquor, 2 Standard drinks or equivalent per week  . Drug Use: No  . Sexual Activity:    Partners: Male   Other Topics Concern  . None   Social History Narrative   Lives in [place] with husband.  Work - [employer] center      Diet - healthy diet   Exercise - limited   Caffeine use: daily    Review of Systems  Constitutional: Negative for fever, chills, appetite change, fatigue and unexpected weight change.  HENT: Negative for congestion, postnasal drip, rhinorrhea, sinus pressure, sneezing, sore throat and voice change.   Eyes: Negative for visual disturbance.  Respiratory: Positive for cough, chest tightness and wheezing. Negative for shortness of breath.   Cardiovascular: Negative for chest pain and leg swelling.  Gastrointestinal: Negative for nausea, vomiting, abdominal pain, diarrhea and constipation.  Musculoskeletal: Positive for myalgias and arthralgias.  Skin: Negative for color change and rash.  Hematological: Negative for adenopathy. Does not bruise/bleed easily.  Psychiatric/Behavioral: Positive for sleep disturbance and dysphoric mood. Negative for  suicidal ideas. The patient is nervous/anxious.        Objective:    BP 138/82 mmHg  Pulse 78  Ht 5\' 3"  (1.6 m)  Wt 267 lb (121.11 kg)  BMI 47.31 kg/m2  SpO2 93% Physical Exam  Constitutional: She is oriented to person, place, and time. She appears well-developed and well-nourished. No distress.  HENT:  Head: Normocephalic and atraumatic.  Right Ear: External ear normal.  Left Ear: External ear normal.  Nose: Nose normal.  Mouth/Throat: Oropharynx is clear and moist. No oropharyngeal exudate or posterior oropharyngeal erythema.  Eyes: Conjunctivae are normal. Pupils are equal, round, and reactive to light. Right eye exhibits no discharge. Left eye exhibits no discharge. No scleral icterus.  Neck: Normal range of motion. Neck supple. No tracheal deviation present. No thyromegaly present.  Cardiovascular: Normal rate, regular rhythm, normal heart sounds and intact distal pulses.  Exam reveals no gallop and no friction rub.   No murmur heard. Pulmonary/Chest: Effort normal and breath sounds normal. No respiratory distress. She has no wheezes. She has no rales. She exhibits no tenderness.  Musculoskeletal: Normal range of motion. She exhibits no edema or tenderness.  Lymphadenopathy:    She has no cervical adenopathy.  Neurological: She is alert and oriented to person, place, and time. No cranial nerve deficit. She exhibits normal muscle tone. Coordination normal.  Skin: Skin is warm and dry. No rash noted. She is not diaphoretic. No erythema. No pallor.  Psychiatric: She has a normal mood and affect. Her behavior is normal. Judgment and thought content normal.          Assessment & Plan:   Problem List Items Addressed This Visit      Unprioritized   Acute bronchitis - Primary    Symptoms improving and exam improved, but continues to have some wheezing and dyspnea. Will repeat Prednisone taper. Follow up in 4 weeks and sooner as needed. Continue Symbicort and prn Albuterol.        Relevant Medications   predniSONE (DELTASONE) 10 MG tablet   Anxiety and depression    Difficult week for her. Offered support. Will continue prn Clonazepam. Continue Effexor. Continue Seroquel. Follow up with new psychiatrist as scheduled.      Tobacco abuse    Encouraged smoking cessation. Discussed limitations of medical therapy with ongoing smoking.          Return in about 4 weeks (around 12/06/2015) for Recheck.  Ronette Deter, MD Internal Medicine [facility]

## 2015-11-08 NOTE — Patient Instructions (Addendum)
Start Prednisone taper.  Follow up after evaluation with GI.

## 2015-11-08 NOTE — Assessment & Plan Note (Signed)
Difficult week for her. Offered support. Will continue prn Clonazepam. Continue Effexor. Continue Seroquel. Follow up with new psychiatrist as scheduled.

## 2015-11-08 NOTE — Assessment & Plan Note (Signed)
Symptoms improving and exam improved, but continues to have some wheezing and dyspnea. Will repeat Prednisone taper. Follow up in 4 weeks and sooner as needed. Continue Symbicort and prn Albuterol.

## 2015-11-08 NOTE — Progress Notes (Signed)
Pre visit review using our clinic review tool, if applicable. No additional management support is needed unless otherwise documented below in the visit note. 

## 2015-12-26 ENCOUNTER — Ambulatory Visit (INDEPENDENT_AMBULATORY_CARE_PROVIDER_SITE_OTHER): Payer: 59 | Admitting: Psychiatry

## 2015-12-26 DIAGNOSIS — M2550 Pain in unspecified joint: Secondary | ICD-10-CM

## 2015-12-26 DIAGNOSIS — F332 Major depressive disorder, recurrent severe without psychotic features: Secondary | ICD-10-CM | POA: Diagnosis not present

## 2015-12-26 MED ORDER — QUETIAPINE FUMARATE 100 MG PO TABS: 100.0000 mg | ORAL_TABLET | Freq: Every day | ORAL | Status: DC

## 2015-12-26 MED ORDER — CLONAZEPAM 0.5 MG PO TABS: 0.5000 mg | ORAL_TABLET | Freq: Two times a day (BID) | ORAL | Status: DC

## 2015-12-26 MED ORDER — VENLAFAXINE HCL ER 150 MG PO CP24: 150.0000 mg | ORAL_CAPSULE | Freq: Every day | ORAL | Status: DC

## 2015-12-26 MED ORDER — TOPIRAMATE 50 MG PO TABS
50.0000 mg | ORAL_TABLET | Freq: Every day | ORAL | Status: DC
Start: 2015-12-26 — End: 2016-01-31

## 2015-12-26 NOTE — Progress Notes (Signed)
BH MD/PA/NP OP Progress Note  12/26/2015 2:57 PM Paula Garrett  MRN:  [id number]  Subjective:  Patient is a 50 year old female who presented for the follow-up appointment. She was previously following Dr. Jimmye Norman. Patient reported that she is feeling very tired and has history of hypothyroidism and has gained a lot of weight. She appeared apprehensive during the interview. 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 She goes to work at 5:30 in the morning. She stated that she has been sleeping well with Seroquel but is concerned about her weight gain. She stated that she has been taking her medications as prescribed and has history of gastric bypass surgery in the past when she has lost 60 pounds. She continues to feel exhausted due to her massive weight . She is interested in taking medications to help her with the weight loss. She denied having any suicidal ideations or plans. She was calm and alert during the interview.  Chief Complaint:   Visit Diagnosis:     ICD-9-CM ICD-10-CM   1. Major depressive disorder, recurrent, severe without psychotic features ([place]) 296.33 F33.2     Past Medical History:  Past Medical History  Diagnosis Date  . Depression   . Arthritis   . GERD (gastroesophageal reflux disease)   . Thyroid disease   . Hypertension   . Anxiety   . Asthma   . Hypothyroidism   . Pneumonia     Past Surgical History  Procedure Laterality Date  . Gastric bypass  2005  . Total thyroidectomy    . Tubal ligation    . Breast surgery      cyst aspiration  . Nasal sinus surgery    . Breast surgery  1998    cyst aspiration  . Ablation  2006  . Hernia repair    . Endometrial ablation    . Breast biopsy Left 1998  . Colonoscopy  03/2014  . Esophagogastroduodenoscopy (egd) with propofol N/A 08/15/2015    Procedure: ESOPHAGOGASTRODUODENOSCOPY (EGD) WITH PROPOFOL;  Surgeon: Lollie Sails, MD;  Location: [facility] ENDOSCOPY;  Service: Endoscopy;   Laterality: N/A;   Family History:  Family History  Problem Relation Age of Onset  . Heart disease Mother   . COPD Mother   . Arthritis Mother   . Cancer Mother     uterine  . Lupus Mother   . Anxiety disorder Mother   . Depression Mother   . Drug abuse Mother   . COPD Father   . Arthritis Father   . Heart disease Father   . Heart attack Father   . Alcohol abuse Father   . Diabetes Maternal Grandmother   . Diabetes Maternal Grandfather   . Diabetes Paternal Grandmother   . Diabetes Paternal Grandfather   . Heart disease Brother   . Heart attack Brother   . Drug abuse Brother   . Anxiety disorder Brother   . Depression Brother   . Heart disease Brother    Social History:  Social History   Social History  . Marital Status: Married    Spouse Name: N/A  . Number of Children: N/A  . Years of Education: N/A   Social History Main Topics  . Smoking status: Current Every Day Smoker -- 1.00 packs/day for 30 years    Types: Cigarettes    Start date: 03/15/1980  . Smokeless tobacco: Never Used  . Alcohol Use: 13.2 oz/week    20 Glasses of wine, 0  Cans of beer, 0 Shots of liquor, 2 Standard drinks or equivalent per week  . Drug Use: No  . Sexual Activity:    Partners: Male   Other Topics Concern  . Not on file   Social History Narrative   Lives in [place] with husband.      Work - [employer] center      Diet - healthy diet   Exercise - limited   Caffeine use: daily   Additional History:   Assessment:   Musculoskeletal: Strength & Muscle Tone: within normal limits Gait & Station: normal Patient leans: N/A  Psychiatric Specialty Exam: HPI  Review of Systems  Musculoskeletal: Negative for joint pain.  Psychiatric/Behavioral: Negative for depression, suicidal ideas, hallucinations, memory loss and substance abuse. The patient is not nervous/anxious and does not have insomnia.   All other systems reviewed and are negative.   There were no vitals taken for this  visit.There is no weight on file to calculate BMI.  General Appearance: Well Groomed  Eye Contact:  Good  Speech:  Normal Rate  Volume:  Normal  Mood:  Anxious and Depressed  Affect:  Depressed and Tearful  Thought Process:  Linear  Orientation:  Full (Time, Place, and Person)  Thought Content:  WDL  Suicidal Thoughts:  No  Homicidal Thoughts:  No  Memory:  Immediate;   Good Recent;   Good Remote;   Good  Judgement:  Good  Insight:  Good  Psychomotor Activity:  Normal  Concentration:  Good  Recall:  Good  Fund of Knowledge: Good  Language: Good  Akathisia:  Negative  Handed:   AIMS (if indicated):  Done 05/16/15 and normal  Assets:  Communication Skills Desire for Improvement Social Support Talents/Skills  ADL's:  Intact  Cognition: WNL  Sleep:  good   Is the patient at risk to self?  No. Has the patient been a risk to self in the past 6 months?  No. Has the patient been a risk to self within the distant past?  No. Is the patient a risk to others?  No. Has the patient been a risk to others in the past 6 months?  No. Has the patient been a risk to others within the distant past?  No.  Current Medications: Current Outpatient Prescriptions  Medication Sig Dispense Refill  . albuterol (PROVENTIL HFA;VENTOLIN HFA) 108 (90 Base) MCG/ACT inhaler Inhale 2 puffs into the lungs every 6 (six) hours as needed for wheezing or shortness of breath. 1 Inhaler 0  . budesonide-formoterol (SYMBICORT) 80-4.5 MCG/ACT inhaler Inhale 2 puffs into the lungs 2 (two) times daily. 1 Inhaler 3  . cetirizine (ZYRTEC) 10 MG tablet Take by mouth.    . clonazePAM (KLONOPIN) 1 MG tablet Take 1 tablet (1 mg total) by mouth 2 (two) times daily. FILL IN MARCH 2017 60 tablet 2  . gabapentin (NEURONTIN) 100 MG capsule Take 1 capsule (100 mg total) by mouth 3 (three) times daily. 90 capsule 3  . lisinopril (PRINIVIL,ZESTRIL) 20 MG tablet Take 1 tablet (20 mg total) by mouth daily. 90 tablet 3  .  pantoprazole (PROTONIX) 40 MG tablet TAKE ONE TABLET BY MOUTH ONCE DAILY 90 tablet 3  . predniSONE (DELTASONE) 10 MG tablet Take 60mg  by mouth on day 1, then taper by 10mg  daily until gone 21 tablet 0  . QUEtiapine (SEROQUEL) 100 MG tablet Take 1 tablet (100 mg total) by mouth at bedtime. 30 tablet 4  . SYNTHROID 137 MCG tablet TAKE TWO TABLETS (  274 MCG) BY MOUTH ONCE DAILY BEFORE BREAKFAST 60 tablet 3  . venlafaxine XR (EFFEXOR-XR) 150 MG 24 hr capsule Take 1 capsule (150 mg total) by mouth every morning. 30 capsule 4   No current facility-administered medications for this visit.    Medical Decision Making:  Established Problem, Stable/Improving (1), Review of Medication Regimen & Side Effects (2) and Review of New Medication or Change in Dosage (2)  Treatment Plan Summary:Medication management and Plan   Major depressive disorder, recurrent, moderate-patient has been on Effexor XR 150 mg. She resumed Seroquel 100 mg at bedtime for augmentation and reports good benefit on this combination.  Generalized anxiety disorder-patient continue her Klonopin 0.5  mg twice a day as needed.  I will start her on Topamax 50 mg by mouth daily at bedtime to help with the weight loss. She agreed with the plan.    More than 50% of the time spent in psychoeducation, counseling and coordination of care.    This note was generated in part or whole with voice recognition software. Voice regonition is usually quite accurate but there are transcription errors that can and very often do occur. I apologize for any typographical errors that were not detected and corrected.   Rainey Pines, MD  12/26/2015, 2:57 PM

## 2015-12-29 ENCOUNTER — Ambulatory Visit: Payer: Managed Care, Other (non HMO) | Admitting: Internal Medicine

## 2016-01-17 ENCOUNTER — Ambulatory Visit (INDEPENDENT_AMBULATORY_CARE_PROVIDER_SITE_OTHER): Payer: Managed Care, Other (non HMO) | Admitting: Family Medicine

## 2016-01-17 ENCOUNTER — Encounter: Payer: Self-pay | Admitting: Family Medicine

## 2016-01-17 ENCOUNTER — Other Ambulatory Visit: Payer: Self-pay | Admitting: Family Medicine

## 2016-01-17 VITALS — BP 153/90 | HR 93 | Temp 98.3°F | Wt 265.4 lb

## 2016-01-17 DIAGNOSIS — E785 Hyperlipidemia, unspecified: Secondary | ICD-10-CM

## 2016-01-17 DIAGNOSIS — E559 Vitamin D deficiency, unspecified: Secondary | ICD-10-CM | POA: Diagnosis not present

## 2016-01-17 DIAGNOSIS — I10 Essential (primary) hypertension: Secondary | ICD-10-CM

## 2016-01-17 DIAGNOSIS — F32A Depression, unspecified: Secondary | ICD-10-CM

## 2016-01-17 DIAGNOSIS — F418 Other specified anxiety disorders: Secondary | ICD-10-CM

## 2016-01-17 DIAGNOSIS — F329 Major depressive disorder, single episode, unspecified: Secondary | ICD-10-CM

## 2016-01-17 DIAGNOSIS — F419 Anxiety disorder, unspecified: Secondary | ICD-10-CM

## 2016-01-17 DIAGNOSIS — E039 Hypothyroidism, unspecified: Secondary | ICD-10-CM

## 2016-01-17 DIAGNOSIS — Z130 Encounter for screening for diseases of the blood and blood-forming organs and certain disorders involving the immune mechanism: Secondary | ICD-10-CM

## 2016-01-17 LAB — CBC
HEMATOCRIT: 37.4 % (ref 36.0–46.0)
HEMOGLOBIN: 12.5 g/dL (ref 12.0–15.0)
MCHC: 33.4 g/dL (ref 30.0–36.0)
MCV: 89.4 fl (ref 78.0–100.0)
Platelets: 254 10*3/uL (ref 150.0–400.0)
RBC: 4.18 Mil/uL (ref 3.87–5.11)
RDW: 14.6 % (ref 11.5–15.5)
WBC: 6.9 10*3/uL (ref 4.0–10.5)

## 2016-01-17 LAB — LIPID PANEL
CHOL/HDL RATIO: 2
Cholesterol: 203 mg/dL — ABNORMAL HIGH (ref 0–200)
HDL: 84.7 mg/dL (ref >39.00)
LDL CALC: 100 mg/dL — AB (ref 0–99)
NONHDL: 117.81
TRIGLYCERIDES: 91 mg/dL (ref 0.0–149.0)
VLDL: 18.2 mg/dL (ref 0.0–40.0)

## 2016-01-17 LAB — HEMOGLOBIN A1C: HEMOGLOBIN A1C: 5.6 % (ref 4.6–6.5)

## 2016-01-17 LAB — TSH: TSH: 0.07 u[IU]/mL — AB (ref 0.35–4.50)

## 2016-01-17 LAB — COMPREHENSIVE METABOLIC PANEL
ALT: 16 U/L (ref 0–35)
AST: 17 U/L (ref 0–37)
Albumin: 3.5 g/dL (ref 3.5–5.2)
Alkaline Phosphatase: 45 U/L (ref 39–117)
BUN: 14 mg/dL (ref 6–23)
CALCIUM: 9.7 mg/dL (ref 8.4–10.5)
CHLORIDE: 105 meq/L (ref 96–112)
CO2: 24 meq/L (ref 19–32)
CREATININE: 0.65 mg/dL (ref 0.40–1.20)
GFR: 101.59 mL/min (ref >60.00)
GLUCOSE: 92 mg/dL (ref 70–99)
Potassium: 4.2 mEq/L (ref 3.5–5.1)
Sodium: 137 mEq/L (ref 135–145)
Total Bilirubin: 0.3 mg/dL (ref 0.2–1.2)
Total Protein: 8.3 g/dL (ref 6.0–8.3)

## 2016-01-17 LAB — VITAMIN D 25 HYDROXY (VIT D DEFICIENCY, FRACTURES): VITD: 23.92 ng/mL — AB (ref 30.00–100.00)

## 2016-01-17 MED ORDER — LEVOTHYROXINE SODIUM 112 MCG PO TABS: 112.0000 ug | ORAL_TABLET | Freq: Every day | ORAL | 0 refills | Status: DC

## 2016-01-17 NOTE — Assessment & Plan Note (Signed)
Uncontrolled. Secondary to noncompliance. Advise compliance with lisinopril.

## 2016-01-17 NOTE — Progress Notes (Signed)
Subjective:  Patient ID: Paula Garrett, female    DOB: 1965/07/18  Age: 50 y.o. MRN: [id number]  CC: Follow up/establish with me  HPI:  50 year old female with anxiety/depression, tobacco abuse, hypertension, hypothyroidism, vitamin d deficiency presents for follow up.  HTN  Has been well controlled.  Elevated today.  Patient has not taken her medication yesterday or today.  Anxiety/depression  Stable currently.  On Effexor, Klonopin, Seroquel.  Hypothyroidism  Stable. Needs labs today.  Social Hx   Social History   Social History  . Marital status: Married    Spouse name: N/A  . Number of children: N/A  . Years of education: N/A   Social History Main Topics  . Smoking status: Current Every Day Smoker    Packs/day: 1.00    Years: 30.00    Types: Cigarettes    Start date: 03/15/1980  . Smokeless tobacco: Never Used  . Alcohol use 13.2 oz/week    20 Glasses of wine, 2 Standard drinks or equivalent per week  . Drug use: No  . Sexual activity: Yes    Partners: Male   Other Topics Concern  . None   Social History Narrative   Lives in [place] with husband.      Work - [employer] center      Diet - healthy diet   Exercise - limited   Caffeine use: daily   Review of Systems  Constitutional: Negative.   Psychiatric/Behavioral: The patient is nervous/anxious.    Objective:  BP (!) 153/90 (BP Location: Right Arm, Patient Position: Sitting, Cuff Size: Large)   Pulse 93   Temp 98.3 F (36.8 C) (Oral)   Wt 265 lb 6 oz (120.4 kg)   SpO2 95%   BMI 47.01 kg/m   BP/Weight 01/17/2016 3/82/5053 02/21/6733  Systolic BP 193 790 240  Diastolic BP 90 82 84  Wt. (Lbs) 265.38 267 264.4  BMI 47.01 47.31 46.85   Physical Exam  Constitutional: She is oriented to person, place, and time.  Morbidly obese female in no acute distress.  Cardiovascular: Regular rhythm.   Pulmonary/Chest: Effort normal and breath sounds normal.  Neurological: She is alert and oriented to  person, place, and time.  Psychiatric:  Tearful during office visit today.  Vitals reviewed.  Lab Results  Component Value Date   WBC 6.9 01/17/2016   HGB 12.5 01/17/2016   HCT 37.4 01/17/2016   PLT 254.0 01/17/2016   GLUCOSE 92 01/17/2016   CHOL 203 (H) 01/17/2016   TRIG 91.0 01/17/2016   HDL 84.70 01/17/2016   LDLCALC 100 (H) 01/17/2016   ALT 16 01/17/2016   AST 17 01/17/2016   NA 137 01/17/2016   K 4.2 01/17/2016   CL 105 01/17/2016   CREATININE 0.65 01/17/2016   BUN 14 01/17/2016   CO2 24 01/17/2016   TSH 0.07 (L) 01/17/2016   HGBA1C 5.6 01/17/2016   MICROALBUR 1.6 03/27/2015    Assessment & Plan:   Problem List Items Addressed This Visit    Anxiety and depression    Stable at this time. Continue Klonopin, Effexor, Seroquel.      Essential hypertension - Primary    Uncontrolled. Secondary to noncompliance. Advise compliance with lisinopril.      Relevant Orders   Comp Met (CMET) (Completed)   Hypothyroidism    Stable. Labs today.      Relevant Orders   TSH (Completed)   Morbid obesity ([place])   Relevant Orders   HgB A1c (Completed)  Vitamin D deficiency   Relevant Orders   Vitamin D (25 hydroxy) (Completed)    Other Visit Diagnoses    Screening for deficiency anemia       Relevant Orders   CBC (Completed)   Hyperlipidemia       Relevant Orders   Lipid Profile (Completed)      No orders of the defined types were placed in this encounter.   Follow-up: No Follow-up on file.  [place]

## 2016-01-17 NOTE — Patient Instructions (Signed)
It was nice to see you today.  Continue current meds.  We will call regarding your lab results.  Follow-up in 6 months.  Take care  Dr. Lacinda Axon

## 2016-01-17 NOTE — Progress Notes (Signed)
Pre visit review using our clinic review tool, if applicable. No additional management support is needed unless otherwise documented below in the visit note. 

## 2016-01-17 NOTE — Assessment & Plan Note (Signed)
Stable at this time. Continue Klonopin, Effexor, Seroquel.

## 2016-01-17 NOTE — Assessment & Plan Note (Signed)
Stable. Labs today 

## 2016-01-19 ENCOUNTER — Encounter: Payer: Self-pay | Admitting: *Deleted

## 2016-01-19 ENCOUNTER — Other Ambulatory Visit: Payer: Self-pay | Admitting: Family Medicine

## 2016-01-19 ENCOUNTER — Encounter: Payer: Self-pay | Admitting: Family Medicine

## 2016-01-19 MED ORDER — LEVOTHYROXINE SODIUM 125 MCG PO TABS: 250.0000 ug | ORAL_TABLET | Freq: Every day | ORAL | 0 refills | Status: DC

## 2016-01-22 ENCOUNTER — Ambulatory Visit: Payer: Managed Care, Other (non HMO) | Admitting: Anesthesiology

## 2016-01-22 ENCOUNTER — Encounter
Admission: RE | Disposition: A | Discharge status: Home Health | Payer: Self-pay | Source: Ambulatory Visit | Attending: Gastroenterology

## 2016-01-22 ENCOUNTER — Encounter: Payer: Self-pay | Admitting: Anesthesiology

## 2016-01-22 ENCOUNTER — Ambulatory Visit
Admission: RE | Admit: 2016-01-22 | Discharge: 2016-01-22 | Disposition: A | Discharge status: Home Health | Payer: Managed Care, Other (non HMO) | Source: Ambulatory Visit | Attending: Gastroenterology | Admitting: Gastroenterology

## 2016-01-22 DIAGNOSIS — K2270 Barrett's esophagus without dysplasia: Secondary | ICD-10-CM | POA: Diagnosis present

## 2016-01-22 DIAGNOSIS — Z9884 Bariatric surgery status: Secondary | ICD-10-CM | POA: Insufficient documentation

## 2016-01-22 DIAGNOSIS — Z7951 Long term (current) use of inhaled steroids: Secondary | ICD-10-CM | POA: Insufficient documentation

## 2016-01-22 DIAGNOSIS — J45909 Unspecified asthma, uncomplicated: Secondary | ICD-10-CM | POA: Insufficient documentation

## 2016-01-22 DIAGNOSIS — Z79899 Other long term (current) drug therapy: Secondary | ICD-10-CM | POA: Insufficient documentation

## 2016-01-22 DIAGNOSIS — M1990 Unspecified osteoarthritis, unspecified site: Secondary | ICD-10-CM | POA: Diagnosis not present

## 2016-01-22 DIAGNOSIS — I10 Essential (primary) hypertension: Secondary | ICD-10-CM | POA: Insufficient documentation

## 2016-01-22 DIAGNOSIS — F419 Anxiety disorder, unspecified: Secondary | ICD-10-CM | POA: Insufficient documentation

## 2016-01-22 DIAGNOSIS — E039 Hypothyroidism, unspecified: Secondary | ICD-10-CM | POA: Diagnosis not present

## 2016-01-22 DIAGNOSIS — K219 Gastro-esophageal reflux disease without esophagitis: Secondary | ICD-10-CM | POA: Insufficient documentation

## 2016-01-22 DIAGNOSIS — F329 Major depressive disorder, single episode, unspecified: Secondary | ICD-10-CM | POA: Diagnosis not present

## 2016-01-22 DIAGNOSIS — K289 Gastrojejunal ulcer, unspecified as acute or chronic, without hemorrhage or perforation: Secondary | ICD-10-CM | POA: Diagnosis not present

## 2016-01-22 DIAGNOSIS — Z6841 Body Mass Index (BMI) 40.0 and over, adult: Secondary | ICD-10-CM | POA: Insufficient documentation

## 2016-01-22 HISTORY — PX: ESOPHAGOGASTRODUODENOSCOPY (EGD) WITH PROPOFOL: SHX5813

## 2016-01-22 SURGERY — ESOPHAGOGASTRODUODENOSCOPY (EGD) WITH PROPOFOL
Anesthesia: General

## 2016-01-22 MED ORDER — PROPOFOL 500 MG/50ML IV EMUL
INTRAVENOUS | Status: DC | PRN
  Administered 2016-01-22: 160 ug/kg/min via INTRAVENOUS

## 2016-01-22 MED ORDER — SODIUM CHLORIDE 0.9 % IV SOLN
INTRAVENOUS | Status: DC
  Administered 2016-01-22: 15:00:00 via INTRAVENOUS
  Administered 2016-01-22: 1000 mL via INTRAVENOUS

## 2016-01-22 MED ORDER — GLYCOPYRROLATE 0.2 MG/ML IJ SOLN
INTRAMUSCULAR | Status: DC | PRN
  Administered 2016-01-22: 0.2 mg via INTRAVENOUS

## 2016-01-22 MED ORDER — SODIUM CHLORIDE 0.9 % IV SOLN: INTRAVENOUS | Status: DC

## 2016-01-22 MED ORDER — PROPOFOL 10 MG/ML IV BOLUS
INTRAVENOUS | Status: DC | PRN
  Administered 2016-01-22 (×3): 50 mg via INTRAVENOUS
  Administered 2016-01-22: 20 mg via INTRAVENOUS

## 2016-01-22 MED ORDER — LIDOCAINE HCL (CARDIAC) 20 MG/ML IV SOLN
INTRAVENOUS | Status: DC | PRN
  Administered 2016-01-22: 100 mg via INTRAVENOUS

## 2016-01-22 MED ORDER — MIDAZOLAM HCL 2 MG/2ML IJ SOLN
INTRAMUSCULAR | Status: DC | PRN
Start: 2016-01-22 — End: 2016-01-22
  Administered 2016-01-22: 2 mg via INTRAVENOUS

## 2016-01-22 NOTE — Op Note (Signed)
[facility] Patient Name: Paula Garrett Procedure Date: 01/22/2016 2:52 PM MRN: [id number] Account #: [id number] Date of Birth: 04/18/1966 Admit Type: Outpatient Age: 50 Room: [facility] ENDO ROOM 4 Gender: Female Note Status: Finalized Procedure:            Upper GI endoscopy Indications:          Follow-up of Barrett's esophagus Providers:            Lollie Sails, MD Referring MD:         Barnie Del Lacinda Axon (Referring MD) Medicines:            Monitored Anesthesia Care Complications:        No immediate complications. Procedure:            Pre-Anesthesia Assessment:                       - ASA Grade Assessment: III - A patient with severe                        systemic disease.                       After obtaining informed consent, the endoscope was                        passed under direct vision. Throughout the procedure,                        the patient's blood pressure, pulse, and oxygen                        saturations were monitored continuously. The Endoscope                        was introduced through the mouth, and advanced to the                        proximal jejunum. The upper GI endoscopy was performed                        with moderate difficulty. The patient tolerated the                        procedure well. Findings:      There were esophageal mucosal changes consistent with short-segment       Barrett's esophagus present in the distal esophagus. The maximum       longitudinal extent of these mucosal changes was 3 cm in length. Mucosa       was biopsied with a cold forceps for histology in 4 quadrants at       intervals of 2 cm at 30 and 32 cm from the incisors. A total of 2       specimen bottles were sent to pathology.      Evidence of a gastric bypass was found. A gastric pouch with a 7 cm       length from the GE junction to the gastrojejunal anastomosis was found.       The staple line appeared intact. The  gastrojejunal anastomosis was       characterized by erosion. This was traversed. The pouch-to-jejunum limb  was characterized by healthy appearing mucosa. Impression:           - Esophageal mucosal changes consistent with                        short-segment Barrett's esophagus. Biopsied.                       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 Gastrojejunal anastomosis characterized by                        erosion with hematin material noted. Recommendation:       - Use Protonix (pantoprazole) 40 mg PO daily daily.                       - Use sucralfate tablets 1 gram PO QID for 1 month.                       - Use sucralfate tablets 1 gram PO BID indefinitely. Procedure Code(s):    --- Professional ---                       [phone], Esophagogastroduodenoscopy, flexible, transoral;                        with biopsy, single or multiple Diagnosis Code(s):    --- Professional ---                       K22.70, Barrett's esophagus without dysplasia                       K28.9, Gastrojejunal ulcer, unspecified as acute or                        chronic, without hemorrhage or perforation CPT copyright 2016 American Medical Association. All rights reserved. The codes documented in this report are preliminary and upon coder review may  be revised to meet current compliance requirements. Lollie Sails, MD 01/22/2016 3:21:03 PM This report has been signed electronically. Number of Addenda: 0 Note Initiated On: 01/22/2016 2:52 PM      [facility]

## 2016-01-22 NOTE — Anesthesia Preprocedure Evaluation (Signed)
Anesthesia Evaluation  Patient identified by MRN, date of birth, ID band Patient awake    Reviewed: Allergy & Precautions, NPO status , Patient's Chart, lab work & pertinent test results  Airway Mallampati: III  TM Distance: <3 FB     Dental no notable dental hx.    Pulmonary asthma , pneumonia, resolved, Current Smoker,  Patient stated that she just had pneumonia.Marland Kitchenswelling of the ankles we will reschedule   Stated this after we had seen her   Pulmonary exam normal        Cardiovascular hypertension, Pt. on medications Normal cardiovascular exam     Neuro/Psych  Headaches, PSYCHIATRIC DISORDERS Anxiety Depression negative neurological ROS     GI/Hepatic Neg liver ROS, GERD  Medicated and Controlled,  Endo/Other  Hypothyroidism   Renal/GU negative Renal ROS  Female GU complaint  negative genitourinary   Musculoskeletal  (+) Arthritis , Osteoarthritis,    Abdominal Normal abdominal exam  (+)   Peds negative pediatric ROS (+)  Hematology  (+) anemia ,   Anesthesia Other Findings Hx of endometrial ablation  Reproductive/Obstetrics                             Anesthesia Physical  Anesthesia Plan  ASA: III  Anesthesia Plan: General   Post-op Pain Management:    Induction: Intravenous  Airway Management Planned: Nasal Cannula  Additional Equipment:   Intra-op Plan:   Post-operative Plan:   Informed Consent: I have reviewed the patients History and Physical, chart, labs and discussed the procedure including the risks, benefits and alternatives for the proposed anesthesia with the patient or authorized representative who has indicated his/her understanding and acceptance.   Dental advisory given  Plan Discussed with: CRNA and Surgeon  Anesthesia Plan Comments:         Anesthesia Quick Evaluation

## 2016-01-22 NOTE — H&P (Signed)
Outpatient short stay form Pre-procedure 01/22/2016 2:48 PM Paula Sails MD  Primary Physician: Dr. Ronette Deter  Reason for visit:  EGD  History of present illness:  Patient is a 50 year old female presenting today for EGD. She has a personal history of Barrett's esophagus. Her last procedure was 04/05/2014. Patient had an attempted EGD on 08/15/2015 however due to reactive airway was unable to intubate. At that time there were some upper respiratory disorder that had complicated this. He is currently taking Protonix 40 mg a day. She denies any dysphagia or heartburn symptoms. 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    Current Facility-Administered Medications:  .  0.9 %  sodium chloride infusion, , Intravenous, Continuous, Paula Sails, MD, Last Rate: 20 mL/hr at 01/22/16 1353, 1,000 mL at 01/22/16 1353 .  0.9 %  sodium chloride infusion, , Intravenous, Continuous, Paula Sails, MD  Prescriptions Prior to Admission  Medication Sig Dispense Refill Last Dose  . albuterol (PROVENTIL HFA;VENTOLIN HFA) 108 (90 Base) MCG/ACT inhaler Inhale 2 puffs into the lungs every 6 (six) hours as needed for wheezing or shortness of breath. 1 Inhaler 0 01/21/2016 at Unknown time  . budesonide-formoterol (SYMBICORT) 80-4.5 MCG/ACT inhaler Inhale 2 puffs into the lungs 2 (two) times daily. 1 Inhaler 3 01/21/2016 at Unknown time  . cetirizine (ZYRTEC) 10 MG tablet Take by mouth.   01/21/2016 at Unknown time  . clonazePAM (KLONOPIN) 0.5 MG tablet Take 1 tablet (0.5 mg total) by mouth 2 (two) times daily. 60 tablet 1 01/21/2016 at Unknown time  . gabapentin (NEURONTIN) 100 MG capsule Take 1 capsule (100 mg total) by mouth 3 (three) times daily. 90 capsule 3 01/21/2016 at Unknown time  . levothyroxine (SYNTHROID) 125 MCG tablet Take 2 tablets (250 mcg total) by mouth daily. 120 tablet 0 01/21/2016 at Unknown time  . lisinopril (PRINIVIL,ZESTRIL) 20 MG tablet Take 1 tablet  (20 mg total) by mouth daily. 90 tablet 3 01/21/2016 at Unknown time  . pantoprazole (PROTONIX) 40 MG tablet TAKE ONE TABLET BY MOUTH ONCE DAILY 90 tablet 3 01/21/2016 at Unknown time  . QUEtiapine (SEROQUEL) 100 MG tablet Take 1 tablet (100 mg total) by mouth at bedtime. 30 tablet 1 01/21/2016 at Unknown time  . topiramate (TOPAMAX) 50 MG tablet Take 1 tablet (50 mg total) by mouth daily. 30 tablet 1 01/21/2016 at Unknown time  . venlafaxine XR (EFFEXOR-XR) 150 MG 24 hr capsule Take 1 capsule (150 mg total) by mouth daily with breakfast. 30 capsule 1 01/21/2016 at Unknown time     No Known Allergies   Past Medical History:  Diagnosis Date  . Anxiety   . Arthritis   . Asthma   . Depression   . GERD (gastroesophageal reflux disease)   . Hypertension   . Hypothyroidism   . Pneumonia   . Thyroid disease     Review of systems:      Physical Exam    Heart and lungs: Regular rate and rhythm without rub or gallop, lungs are bilaterally clear.    HEENT: Normocephalic atraumatic eyes are anicteric    Other:     Pertinant exam for procedure: Soft, morbidly obese. Bowel sounds positive normoactive. Nontender.    Planned proceedures: EGD and indicated procedures I have discussed the risks benefits and complications of procedures to include not limited to bleeding, infection, perforation and the risk of sedation and the patient wishes to proceed.    Billie Ruddy  Gustavo Lah, MD Gastroenterology 01/22/2016  2:48 PM

## 2016-01-22 NOTE — Transfer of Care (Signed)
Immediate Anesthesia Transfer of Care Note  Patient: Paula Garrett  Procedure(s) Performed: Procedure(s): ESOPHAGOGASTRODUODENOSCOPY (EGD) WITH PROPOFOL (N/A)  Patient Location: PACU  Anesthesia Type:General  Level of Consciousness: awake, alert  and oriented  Airway & Oxygen Therapy: Patient Spontanous Breathing and Patient connected to nasal cannula oxygen  Post-op Assessment: Report given to RN and Post -op Vital signs reviewed and stable  Post vital signs: Reviewed and stable  Last Vitals:  Vitals:   01/22/16 1336  BP: 133/90  Pulse: 77  Resp: 18  Temp: 37.6 C    Last Pain:  Vitals:   01/22/16 1336  TempSrc: Tympanic         Complications: No apparent anesthesia complications

## 2016-01-22 NOTE — Anesthesia Postprocedure Evaluation (Signed)
Anesthesia Post Note  Patient: Paula Garrett  Procedure(s) Performed: Procedure(s) (LRB): ESOPHAGOGASTRODUODENOSCOPY (EGD) WITH PROPOFOL (N/A)  Patient location during evaluation: PACU Anesthesia Type: General Level of consciousness: awake and alert and oriented Pain management: pain level controlled Vital Signs Assessment: post-procedure vital signs reviewed and stable Respiratory status: spontaneous breathing, nonlabored ventilation and respiratory function stable Cardiovascular status: blood pressure returned to baseline and stable Postop Assessment: no signs of nausea or vomiting Anesthetic complications: no    Last Vitals:  Vitals:   01/22/16 1530 01/22/16 1540  BP: 127/72 119/77  Pulse: 72 79  Resp: (!) 24 20  Temp:      Last Pain:  Vitals:   01/22/16 1522  TempSrc: Tympanic                 Reizel Calzada

## 2016-01-23 ENCOUNTER — Encounter: Payer: Self-pay | Admitting: Gastroenterology

## 2016-01-24 LAB — SURGICAL PATHOLOGY

## 2016-01-31 ENCOUNTER — Encounter: Payer: Self-pay | Admitting: Psychiatry

## 2016-01-31 ENCOUNTER — Ambulatory Visit (INDEPENDENT_AMBULATORY_CARE_PROVIDER_SITE_OTHER): Payer: 59 | Admitting: Psychiatry

## 2016-01-31 VITALS — BP 145/84 | HR 89 | Temp 97.8°F | Ht 63.0 in | Wt 268.6 lb

## 2016-01-31 DIAGNOSIS — F332 Major depressive disorder, recurrent severe without psychotic features: Secondary | ICD-10-CM | POA: Diagnosis not present

## 2016-01-31 MED ORDER — TOPIRAMATE 100 MG PO TABS: 100.0000 mg | ORAL_TABLET | Freq: Every day | ORAL | 1 refills | Status: DC

## 2016-01-31 NOTE — Progress Notes (Signed)
BH MD/PA/NP OP Progress Note  01/31/2016 2:59 PM Paula Garrett  MRN:  [id number]  Subjective:  Patient is a 50 year old female who presented for the follow-up appointment. She was previously following Dr. Jimmye Norman. Patient reported that she is feeling very tired and has history of hypothyroidism and has gained a lot of weight. She reported that she continues to feel anxious and has been working as a Brewing technologist. She started taking Topamax but has not noticed any difference. She wants to go higher on the dose. She sleeps well at night. She has been compliant with her weight loss medications. She is taking her medications as prescribed and is not having any side effects of the medication at this time. She denied having any perceptual disturbances. She denied having any suicidal homicidal ideations or plans.     Visit Diagnosis:     ICD-9-CM ICD-10-CM   1. Major depressive disorder, recurrent, severe without psychotic features ([place]) 296.33 F33.2     Past Medical History:  Past Medical History:  Diagnosis Date  . Anxiety   . Arthritis   . Asthma   . Depression   . GERD (gastroesophageal reflux disease)   . Hypertension   . Hypothyroidism   . Pneumonia   . Thyroid disease     Past Surgical History:  Procedure Laterality Date  . ABLATION  2006  . BREAST BIOPSY Left 1998  . BREAST SURGERY     cyst aspiration  . BREAST SURGERY  1998   cyst aspiration  . COLONOSCOPY  03/2014  . ENDOMETRIAL ABLATION    . ESOPHAGOGASTRODUODENOSCOPY (EGD) WITH PROPOFOL N/A 08/15/2015   Procedure: ESOPHAGOGASTRODUODENOSCOPY (EGD) WITH PROPOFOL;  Surgeon: Lollie Sails, MD;  Location: [facility] ENDOSCOPY;  Service: Endoscopy;  Laterality: N/A;  . ESOPHAGOGASTRODUODENOSCOPY (EGD) WITH PROPOFOL N/A 01/22/2016   Procedure: ESOPHAGOGASTRODUODENOSCOPY (EGD) WITH PROPOFOL;  Surgeon: Lollie Sails, MD;  Location[facility] END[facility]COPY;  Service: Endoscopy;  Laterality: N/A;  . GASTRIC BYPASS  2005  . HERNIA  REPAIR    . NASAL SINUS SURGERY    . TOTAL THYROIDECTOMY    . TUBAL LIGATION     Family History:  Family History  Problem Relation Age of Onset  . Heart disease Mother   . COPD Mother   . Arthritis Mother   . Cancer Mother     uterine  . Lupus Mother   . Anxiety disorder Mother   . Depression Mother   . Drug abuse Mother   . COPD Father   . Arthritis Father   . Heart disease Father   . Heart attack Father   . Alcohol abuse Father   . Heart disease Brother   . Heart attack Brother   . Drug abuse Brother   . Anxiety disorder Brother   . Depression Brother   . Heart disease Brother   . Diabetes Maternal Grandmother   . Diabetes Maternal Grandfather   . Diabetes Paternal Grandmother   . Diabetes Paternal Grandfather    Social History:  Social History   Social History  . Marital status: Married    Spouse name: N/A  . Number of children: N/A  . Years of education: N/A   Social History Main Topics  . Smoking status: Current Every Day Smoker    Packs/day: 1.00    Years: 30.00    Types: Cigarettes    Start date: 03/15/1980  . Smokeless tobacco: Never Used  . Alcohol use 13.2 oz/week    20 Glasses of  wine, 2 Standard drinks or equivalent per week  . Drug use: No  . Sexual activity: Yes    Partners: Male   Other Topics Concern  . Not on file   Social History Narrative   [place].      Work - [employer] center      Diet - healthy diet   Exercise - limited   Caffeine use: daily   Additional History:   Assessment:   Musculoskeletal: Strength & Muscle Tone: within normal limits Gait & Station: normal Patient leans: N/A  Psychiatric Specialty Exam: HPI  Review of Systems  Musculoskeletal: Negative for joint pain.  Psychiatric/Behavioral: Negative for depression, hallucinations, memory loss, substance abuse and suicidal ideas. The patient is not nervous/anxious and does not have insomnia.   All other systems reviewed and are negative.    There were no vitals taken for this visit.There is no height or weight on file to calculate BMI.  General Appearance: Well Groomed  Eye Contact:  Good  Speech:  Normal Rate  Volume:  Normal  Mood:  Anxious and Depressed  Affect:  Depressed and Tearful  Thought Process:  Linear  Orientation:  Full (Time, Place, and Person)  Thought Content:  WDL  Suicidal Thoughts:  No  Homicidal Thoughts:  No  Memory:  Immediate;   Good Recent;   Good Remote;   Good  Judgement:  Good  Insight:  Good  Psychomotor Activity:  Normal  Concentration:  Good  Recall:  Good  Fund of Knowledge: Good  Language: Good  Akathisia:  Negative  Handed:   AIMS (if indicated):  Done 05/16/15 and normal  Assets:  Communication Skills Desire for Improvement Social Support Talents/Skills  ADL's:  Intact  Cognition: WNL  Sleep:  good   Is the patient at risk to self?  No. Has the patient been a risk to self in the past 6 months?  No. Has the patient been a risk to self within the distant past?  No. Is the patient a risk to others?  No. Has the patient been a risk to others in the past 6 months?  No. Has the patient been a risk to others within the distant past?  No.  Current Medications: Current Outpatient Prescriptions  Medication Sig Dispense Refill  . albuterol (PROVENTIL HFA;VENTOLIN HFA) 108 (90 Base) MCG/ACT inhaler Inhale 2 puffs into the lungs every 6 (six) hours as needed for wheezing or shortness of breath. 1 Inhaler 0  . budesonide-formoterol (SYMBICORT) 80-4.5 MCG/ACT inhaler Inhale 2 puffs into the lungs 2 (two) times daily. 1 Inhaler 3  . cetirizine (ZYRTEC) 10 MG tablet Take by mouth.    . clonazePAM (KLONOPIN) 0.5 MG tablet Take 1 tablet (0.5 mg total) by mouth 2 (two) times daily. 60 tablet 1  . gabapentin (NEURONTIN) 100 MG capsule Take 1 capsule (100 mg total) by mouth 3 (three) times daily. 90 capsule 3  . levothyroxine (SYNTHROID) 125 MCG tablet Take 2 tablets (250 mcg total) by mouth  daily. 120 tablet 0  . lisinopril (PRINIVIL,ZESTRIL) 20 MG tablet Take 1 tablet (20 mg total) by mouth daily. 90 tablet 3  . pantoprazole (PROTONIX) 40 MG tablet TAKE ONE TABLET BY MOUTH ONCE DAILY 90 tablet 3  . QUEtiapine (SEROQUEL) 100 MG tablet Take 1 tablet (100 mg total) by mouth at bedtime. 30 tablet 1  . topiramate (TOPAMAX) 50 MG tablet Take 1 tablet (50 mg total) by mouth daily. 30 tablet 1  .  venlafaxine XR (EFFEXOR-XR) 150 MG 24 hr capsule Take 1 capsule (150 mg total) by mouth daily with breakfast. 30 capsule 1   No current facility-administered medications for this visit.     Medical Decision Making:  Established Problem, Stable/Improving (1), Review of Medication Regimen & Side Effects (2) and Review of New Medication or Change in Dosage (2)  Treatment Plan Summary:Medication management and Plan   Major depressive disorder, recurrent, moderate-patient has been on Effexor XR 150 mg. She resumed Seroquel 100 mg at bedtime for augmentation and reports good benefit on this combination.  Generalized anxiety disorder-patient continue her Klonopin 0.5  mg twice a day as needed.  I will start her on Topamax 100 mg by mouth daily at bedtime to help with the weight loss. She agreed with the plan.    More than 50% of the time spent in psychoeducation, counseling and coordination of care.    This note was generated in part or whole with voice recognition software. Voice regonition is usually quite accurate but there are transcription errors that can and very often do occur. I apologize for any typographical errors that were not detected and corrected.   Rainey Pines, MD  01/31/2016, 2:59 PM

## 2016-02-05 ENCOUNTER — Encounter: Payer: Self-pay | Admitting: Physician Assistant

## 2016-02-05 ENCOUNTER — Ambulatory Visit: Payer: Self-pay | Admitting: Physician Assistant

## 2016-02-05 VITALS — BP 140/89 | HR 93 | Temp 97.7°F

## 2016-02-05 DIAGNOSIS — J440 Chronic obstructive pulmonary disease with acute lower respiratory infection: Secondary | ICD-10-CM

## 2016-02-05 MED ORDER — METHYLPREDNISOLONE 4 MG PO TBPK: ORAL_TABLET | ORAL | 0 refills | Status: DC

## 2016-02-05 MED ORDER — CEFDINIR 300 MG PO CAPS: 300.0000 mg | ORAL_CAPSULE | Freq: Two times a day (BID) | ORAL | 0 refills | Status: DC

## 2016-02-05 NOTE — Progress Notes (Signed)
S: C/o cough and congestion with wheezing and chest pain, chest is sore from coughing, denies fever, chills, mucus was green this am, but mainly cough is dry and hacking; keeping pt awake at night;  denies cardiac type chest pain or sob, v/d, abd pain Remainder ros neg  O: vitals wnl, nad, tms clear, throat injected, neck supple no lymph, lungs with wheezing, cv rrr, neuro intact  A:  Acute bronchitis   P:  rx medication:  omnicef 300, medrol dose; smoking cessation, use otc meds, tylenol or motrin as needed for fever/chills, return if not better in 3 -5 days, return earlier if worsening

## 2016-02-28 ENCOUNTER — Encounter: Payer: Self-pay | Admitting: Psychiatry

## 2016-02-28 ENCOUNTER — Ambulatory Visit (INDEPENDENT_AMBULATORY_CARE_PROVIDER_SITE_OTHER): Payer: 59 | Admitting: Psychiatry

## 2016-02-28 VITALS — BP 163/100 | HR 90 | Temp 97.4°F | Ht 63.0 in | Wt 269.0 lb

## 2016-02-28 DIAGNOSIS — M2550 Pain in unspecified joint: Secondary | ICD-10-CM

## 2016-02-28 DIAGNOSIS — F331 Major depressive disorder, recurrent, moderate: Secondary | ICD-10-CM

## 2016-02-28 MED ORDER — GABAPENTIN 300 MG PO CAPS: 300.0000 mg | ORAL_CAPSULE | Freq: Every day | ORAL | 1 refills | Status: DC

## 2016-02-28 MED ORDER — VENLAFAXINE HCL ER 150 MG PO CP24: 150.0000 mg | ORAL_CAPSULE | Freq: Every day | ORAL | 1 refills | Status: DC

## 2016-02-28 MED ORDER — QUETIAPINE FUMARATE 100 MG PO TABS: 100.0000 mg | ORAL_TABLET | Freq: Every day | ORAL | 1 refills | Status: DC

## 2016-02-28 MED ORDER — CLONAZEPAM 0.5 MG PO TABS: 0.5000 mg | ORAL_TABLET | Freq: Two times a day (BID) | ORAL | 1 refills | Status: DC

## 2016-02-28 MED ORDER — TOPIRAMATE 100 MG PO TABS: 100.0000 mg | ORAL_TABLET | Freq: Every day | ORAL | 1 refills | Status: DC

## 2016-02-28 NOTE — Progress Notes (Signed)
BH MD/PA/NP OP Progress Note  02/28/2016 4:02 PM Paula Garrett  MRN:  [id number]  Subjective:  Patient is a 50 year old female who presented for the follow-up appointment. She reported that she is currently experiencing upper respiratory infection and has been taking medications for the same. She forgot to take her blood pressure medication and her blood pressure was extremely elevated. She reported that she is going to take the medication as soon as she will go home. We discussed about the medications for weight loss. She feels that the Topamax is helping her and she is not eating as well. However she drinks caffeine related beverages throughout the day including coffee and tea and soda. She is interested in losing weight. She is also interested in trying the phentermine. However I advised patient that due to her elevated blood pressure it might not be a good choice for her at this time.  She currently denied having any mood swings anger anxiety or paranoia   She is taking her medications as prescribed and is not having any side effects of the medication at this time. She denied having any perceptual disturbances. She denied having any suicidal homicidal ideations or plans.    Chief Complaint    Follow-up; Medication Refill     Visit Diagnosis:     ICD-9-CM ICD-10-CM   1. 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 Anxiety   . Arthritis   . Asthma   . Depression   . GERD (gastroesophageal reflux disease)   . Hypertension   . Hypothyroidism   . Pneumonia   . Thyroid disease     Past Surgical History:  Procedure Laterality Date  . ABLATION  2006  . BREAST BIOPSY Left 1998  . BREAST SURGERY     cyst aspiration  . BREAST SURGERY  1998   cyst aspiration  . COLONOSCOPY  03/2014  . ENDOMETRIAL ABLATION    . ESOPHAGOGASTRODUODENOSCOPY (EGD) WITH PROPOFOL N/A 08/15/2015    Procedure: ESOPHAGOGASTRODUODENOSCOPY (EGD) WITH PROPOFOL;  Surgeon: Lollie Sails, MD;  Location: [facility] ENDOSCOPY;  Service: Endoscopy;  Laterality: N/A;  . ESOPHAGOGASTRODUODENOSCOPY (EGD) WITH PROPOFOL N/A 01/22/2016   Procedure: ESOPHAGOGASTRODUODENOSCOPY (EGD) WITH PROPOFOL;  Surgeon: Lollie Sails, MD;  Location: [facility] ENDOSCOPY;  Service: Endoscopy;  Laterality: N/A;  . GASTRIC BYPASS  2005  . HERNIA REPAIR    . NASAL SINUS SURGERY    . TOTAL THYROIDECTOMY    . TUBAL LIGATION     Family History:  Family History  Problem Relation Age of Onset  . Heart disease Mother   . COPD Mother   . Arthritis Mother   . Cancer Mother     uterine  . Lupus Mother   . Anxiety disorder Mother   . Depression Mother   . Drug abuse Mother   . COPD Father   . Arthritis Father   . Heart disease Father   . Heart attack Father   . Alcohol abuse Father   . Heart disease Brother   . Heart attack Brother   . Drug abuse Brother   . Anxiety disorder Brother   . Depression Brother   . Heart disease Brother   . Diabetes Maternal Grandmother   . Diabetes Maternal Grandfather   . Diabetes Paternal Grandmother   . Diabetes Paternal Grandfather    Social History:  Social History   Social History  .  Marital status: Married    Spouse name: N/A  . Number of children: N/A  . Years of education: N/A   Social History Main Topics  . Smoking status: Current Every Day Smoker    Packs/day: 1.00    Years: 30.00    Types: Cigarettes    Start date: 03/15/1980  . Smokeless tobacco: Never Used  . Alcohol use 6.0 - 7.2 oz/week    2 Standard drinks or equivalent, 8 - 10 Glasses of wine per week  . Drug use: No  . Sexual activity: Yes    Partners: Male   Other Topics Concern  . None   Social History Narrative   Lives in [place] with husband.      Work - [employer] center      Diet - healthy diet   Exercise - limited   Caffeine use: daily   Additional History:   Assessment:    Musculoskeletal: Strength & Muscle Tone: within normal limits Gait & Station: normal Patient leans: N/A  Psychiatric Specialty Exam: HPI  Review of Systems  Musculoskeletal: Negative for joint pain.  Psychiatric/Behavioral: Negative for depression, hallucinations, memory loss, substance abuse and suicidal ideas. The patient is not nervous/anxious and does not have insomnia.   All other systems reviewed and are negative.   Blood pressure (!) 163/100, pulse 90, temperature 97.4 F (36.3 C), temperature source Oral, height 5\' 3"  (1.6 m), weight 269 lb (122 kg).Body mass index is 47.65 kg/m.  General Appearance: Well Groomed  Eye Contact:  Good  Speech:  Normal Rate  Volume:  Normal  Mood:  Anxious and Depressed  Affect:  Depressed and Tearful  Thought Process:  Linear  Orientation:  Full (Time, Place, and Person)  Thought Content:  WDL  Suicidal Thoughts:  No  Homicidal Thoughts:  No  Memory:  Immediate;   Good Recent;   Good Remote;   Good  Judgement:  Good  Insight:  Good  Psychomotor Activity:  Normal  Concentration:  Good  Recall:  Good  Fund of Knowledge: Good  Language: Good  Akathisia:  Negative  Handed:   AIMS (if indicated):  Done 05/16/15 and normal  Assets:  Communication Skills Desire for Improvement Social Support Talents/Skills  ADL's:  Intact  Cognition: WNL  Sleep:  good   Is the patient at risk to self?  No. Has the patient been a risk to self in the past 6 months?  No. Has the patient been a risk to self within the distant past?  No. Is the patient a risk to others?  No. Has the patient been a risk to others in the past 6 months?  No. Has the patient been a risk to others within the distant past?  No.  Current Medications: Current Outpatient Prescriptions  Medication Sig Dispense Refill  . budesonide-formoterol (SYMBICORT) 80-4.5 MCG/ACT inhaler Inhale 2 puffs into the lungs 2 (two) times daily. 1 Inhaler 3  . cefdinir (OMNICEF) 300 MG  capsule Take 1 capsule (300 mg total) by mouth 2 (two) times daily. 20 capsule 0  . cetirizine (ZYRTEC) 10 MG tablet Take by mouth.    . Cholecalciferol (VITAMIN D) 2000 units CAPS Take by mouth.    . clonazePAM (KLONOPIN) 0.5 MG tablet Take 1 tablet (0.5 mg total) by mouth 2 (two) times daily. 60 tablet 1  . gabapentin (NEURONTIN) 100 MG capsule Take 1 capsule (100 mg total) by mouth 3 (three) times daily. 90 capsule 3  . levothyroxine (SYNTHROID) 125 MCG  tablet Take 2 tablets (250 mcg total) by mouth daily. 120 tablet 0  . lisinopril (PRINIVIL,ZESTRIL) 20 MG tablet Take 1 tablet (20 mg total) by mouth daily. 90 tablet 3  . methylPREDNISolone (MEDROL DOSEPAK) 4 MG TBPK tablet Take 6 pills on day one then decrease by 1 pill each day 21 tablet 0  . pantoprazole (PROTONIX) 40 MG tablet TAKE ONE TABLET BY MOUTH ONCE DAILY 90 tablet 3  . QUEtiapine (SEROQUEL) 100 MG tablet Take 1 tablet (100 mg total) by mouth at bedtime. 30 tablet 1  . sucralfate (CARAFATE) 1 g tablet Take by mouth.    . topiramate (TOPAMAX) 100 MG tablet Take 1 tablet (100 mg total) by mouth daily. 30 tablet 1  . venlafaxine XR (EFFEXOR-XR) 150 MG 24 hr capsule Take 1 capsule (150 mg total) by mouth daily with breakfast. 30 capsule 1   No current facility-administered medications for this visit.     Medical Decision Making:  Established Problem, Stable/Improving (1), Review of Medication Regimen & Side Effects (2) and Review of New Medication or Change in Dosage (2)  Treatment Plan Summary:Medication management and Plan   Major depressive disorder, recurrent, moderate-patient has been on Effexor XR 150 mg. She resumed Seroquel 100 mg at bedtime for augmentation and reports good benefit on this combination.  Generalized anxiety disorder-patient continue her Klonopin 0.5 daily   Continue  Topamax 100 mg by mouth daily at bedtime to help with the weight loss. She agreed with the plan.    More than 50% of the time spent in  psychoeducation, counseling and coordination of care.    This note was generated in part or whole with voice recognition software. Voice regonition is usually quite accurate but there are transcription errors that can and very often do occur. I apologize for any typographical errors that were not detected and corrected.   Rainey Pines, MD  02/28/2016, 4:02 PM

## 2016-03-05 ENCOUNTER — Ambulatory Visit: Payer: Self-pay | Admitting: Physician Assistant

## 2016-03-05 ENCOUNTER — Encounter: Payer: Self-pay | Admitting: Physician Assistant

## 2016-03-05 VITALS — BP 120/70 | HR 95 | Temp 98.7°F

## 2016-03-05 DIAGNOSIS — J302 Other seasonal allergic rhinitis: Secondary | ICD-10-CM

## 2016-03-05 DIAGNOSIS — J4521 Mild intermittent asthma with (acute) exacerbation: Secondary | ICD-10-CM

## 2016-03-05 MED ORDER — MONTELUKAST SODIUM 10 MG PO TABS: 10.0000 mg | ORAL_TABLET | Freq: Every day | ORAL | 3 refills | Status: DC

## 2016-03-05 MED ORDER — METHYLPREDNISOLONE 4 MG PO TBPK: ORAL_TABLET | ORAL | 0 refills | Status: DC

## 2016-03-05 NOTE — Progress Notes (Signed)
S: C/o cough and congestion with wheezing and chest pain, chest is sore from coughing, denies fever, chills, mucus is clear, keeping pt awake at night;  Awakens with headache in the mornings, denies cardiac type chest pain or sob, v/d, abd pain + smoker Remainder ros neg  O: vitals wnl, nad, tms clear, throat injected, neck supple no lymph, lungs c t a,  cv rrr, neuro intact  A:  Acute bronchitis   P:  rx medication:  Singulair, medrol dose pack; smoking cessation, use otc meds, tylenol or motrin as needed for fever/chills, return if not better in 3 -5 days, return earlier if worsening, will refer to pulmonology if not improving in few days

## 2016-03-17 ENCOUNTER — Other Ambulatory Visit: Payer: Self-pay | Admitting: Family Medicine

## 2016-03-25 ENCOUNTER — Ambulatory Visit: Payer: 59 | Admitting: Psychiatry

## 2016-03-27 ENCOUNTER — Ambulatory Visit (INDEPENDENT_AMBULATORY_CARE_PROVIDER_SITE_OTHER): Payer: 59 | Admitting: Psychiatry

## 2016-03-27 VITALS — BP 128/82 | HR 92 | Ht 62.0 in | Wt 267.4 lb

## 2016-03-27 DIAGNOSIS — F331 Major depressive disorder, recurrent, moderate: Secondary | ICD-10-CM | POA: Diagnosis not present

## 2016-03-27 MED ORDER — CLONAZEPAM 0.5 MG PO TABS: 0.5000 mg | ORAL_TABLET | Freq: Two times a day (BID) | ORAL | 1 refills | Status: DC

## 2016-03-27 MED ORDER — VENLAFAXINE HCL ER 150 MG PO CP24: 150.0000 mg | ORAL_CAPSULE | Freq: Every day | ORAL | 1 refills | Status: DC

## 2016-03-27 MED ORDER — TOPIRAMATE 100 MG PO TABS: 100.0000 mg | ORAL_TABLET | Freq: Every day | ORAL | 1 refills | Status: DC

## 2016-03-27 MED ORDER — GABAPENTIN 300 MG PO CAPS: 300.0000 mg | ORAL_CAPSULE | Freq: Every day | ORAL | 1 refills | Status: DC

## 2016-03-27 MED ORDER — QUETIAPINE FUMARATE 100 MG PO TABS: 100.0000 mg | ORAL_TABLET | Freq: Every day | ORAL | 1 refills | Status: DC

## 2016-03-27 NOTE — Progress Notes (Signed)
BH MD/PA/NP OP Progress Note  03/27/2016 1:34 PM Paula Garrett  MRN:  [id number]  Subjective:  Patient is a 50 year old female who presented for the follow-up appointment. She was tearful during the interview. She reported that she has not lost any weight since her last appointment. 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 She reported it has been difficult for her to lose weight as she is set in treatment most of the time. We discussed about her medications.. She reported that she enjoys drinking soda and eats her lunch as she has been taking it from home. She reported that she has been trying hard to decrease weight but it is not moving. We discussed about her medications. She reported that she has been taking them as prescribed and is not experiencing any side effects at this time. Patient currently denied having any suicidal ideations or plans. She was recently given Medrol Dosepak for her upper respiratory infection. She has tried phentermine in the past but it was not effective for weight loss.     She currently denied having any mood swings anger anxiety or paranoia   She is taking her medications as prescribed and is not having any side effects of the medication at this time. She denied having any perceptual disturbances. She denied having any suicidal homicidal ideations or plans.     Visit Diagnosis:     ICD-9-CM ICD-10-CM   1. 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 Anxiety   . Arthritis   . Asthma   . Depression   . GERD (gastroesophageal reflux disease)   . Hypertension   . Hypothyroidism   . Pneumonia   . Thyroid disease     Past Surgical History:  Procedure Laterality Date  . ABLATION  2006  . BREAST BIOPSY Left 1998  . BREAST SURGERY     cyst aspiration  . BREAST SURGERY  1998   cyst aspiration  . COLONOSCOPY   03/2014  . ENDOMETRIAL ABLATION    . ESOPHAGOGASTRODUODENOSCOPY (EGD) WITH PROPOFOL N/A 08/15/2015   Procedure: ESOPHAGOGASTRODUODENOSCOPY (EGD) WITH PROPOFOL;  Surgeon: Lollie Sails, MD;  Location: [facility] ENDOSCOPY;  Service: Endoscopy;  Laterality: N/A;  . ESOPHAGOGASTRODUODENOSCOPY (EGD) WITH PROPOFOL N/A 01/22/2016   Procedure: ESOPHAGOGASTRODUODENOSCOPY (EGD) WITH PROPOFOL;  Surgeon: Lollie Sails, MD;  Location: [facility] ENDOSCOPY;  Service: Endoscopy;  Laterality: N/A;  . GASTRIC BYPASS  2005  . HERNIA REPAIR    . NASAL SINUS SURGERY    . TOTAL THYROIDECTOMY    . TUBAL LIGATION     Family History:  Family History  Problem Relation Age of Onset  . Heart disease Mother   . COPD Mother   . Arthritis Mother   . Cancer Mother     uterine  . Lupus Mother   . Anxiety disorder Mother   . Depression Mother   . Drug abuse Mother   . COPD Father   . Arthritis Father   . Heart disease Father   . Heart attack Father   . Alcohol abuse Father   . Heart disease Brother   . Heart attack Brother   . Drug abuse Brother   . Anxiety disorder Brother   . Depression Brother   . Heart disease Brother   . Diabetes Maternal Grandmother   . Diabetes Maternal Grandfather   .  Diabetes Paternal Grandmother   . Diabetes Paternal Grandfather    Social History:  Social History   Social History  . Marital status: Married    Spouse name: N/A  . Number of children: N/A  . Years of education: N/A   Social History Main Topics  . Smoking status: Current Every Day Smoker    Packs/day: 1.00    Years: 30.00    Types: Cigarettes    Start date: 03/15/1980  . Smokeless tobacco: Never Used  . Alcohol use 6.0 - 7.2 oz/week    2 Standard drinks or equivalent, 8 - 10 Glasses of wine per week  . Drug use: No  . Sexual activity: Yes    Partners: Male   Other Topics Concern  . Not on file   Social History Narrative   Lives in [place] with husband.      Work - [employer]      Diet - healthy  diet   Exercise - limited   Caffeine use: daily   Additional History:   Assessment:   Musculoskeletal: Strength & Muscle Tone: within normal limits Gait & Station: normal Patient leans: N/A  Psychiatric Specialty Exam: Medication Refill     Review of Systems  Musculoskeletal: Negative for joint pain.  Psychiatric/Behavioral: Negative for depression, hallucinations, memory loss, substance abuse and suicidal ideas. The patient is not nervous/anxious and does not have insomnia.   All other systems reviewed and are negative.   Blood pressure 128/82, pulse 92, height 5\' 2"  (1.575 m), weight 267 lb 6.4 oz (121.3 kg).Body mass index is 48.91 kg/m.  General Appearance: Well Groomed  Eye Contact:  Good  Speech:  Normal Rate  Volume:  Normal  Mood:  Anxious and Depressed  Affect:  Depressed and Tearful  Thought Process:  Linear  Orientation:  Full (Time, Place, and Person)  Thought Content:  WDL  Suicidal Thoughts:  No  Homicidal Thoughts:  No  Memory:  Immediate;   Good Recent;   Good Remote;   Good  Judgement:  Good  Insight:  Good  Psychomotor Activity:  Normal  Concentration:  Good  Recall:  Good  Fund of Knowledge: Good  Language: Good  Akathisia:  Negative  Handed:   AIMS (if indicated):  Done 05/16/15 and normal  Assets:  Communication Skills Desire for Improvement Social Support Talents/Skills  ADL's:  Intact  Cognition: WNL  Sleep:  good   Is the patient at risk to self?  No. Has the patient been a risk to self in the past 6 months?  No. Has the patient been a risk to self within the distant past?  No. Is the patient a risk to others?  No. Has the patient been a risk to others in the past 6 months?  No. Has the patient been a risk to others within the distant past?  No.  Current Medications: Current Outpatient Prescriptions  Medication Sig Dispense Refill  . budesonide-formoterol (SYMBICORT) 80-4.5 MCG/ACT inhaler Inhale 2 puffs into the lungs 2 (two)  times daily. 1 Inhaler 3  . cefdinir (OMNICEF) 300 MG capsule Take 1 capsule (300 mg total) by mouth 2 (two) times daily. (Patient not taking: Reported on 03/05/2016) 20 capsule 0  . cetirizine (ZYRTEC) 10 MG tablet Take by mouth.    . Cholecalciferol (VITAMIN D) 2000 units CAPS Take by mouth.    . clonazePAM (KLONOPIN) 0.5 MG tablet Take 1 tablet (0.5 mg total) by mouth 2 (two) times daily. 60 tablet 1  .  gabapentin (NEURONTIN) 300 MG capsule Take 1 capsule (300 mg total) by mouth at bedtime. 30 capsule 1  . lisinopril (PRINIVIL,ZESTRIL) 20 MG tablet Take 1 tablet (20 mg total) by mouth daily. 90 tablet 3  . montelukast (SINGULAIR) 10 MG tablet Take 1 tablet (10 mg total) by mouth at bedtime. 30 tablet 3  . pantoprazole (PROTONIX) 40 MG tablet TAKE ONE TABLET BY MOUTH ONCE DAILY 90 tablet 3  . QUEtiapine (SEROQUEL) 100 MG tablet Take 1 tablet (100 mg total) by mouth at bedtime. 30 tablet 1  . sucralfate (CARAFATE) 1 g tablet Take by mouth.    . SYNTHROID 125 MCG tablet TAKE TWO TABLETS BY MOUTH ONCE DAILY 120 tablet 1  . topiramate (TOPAMAX) 100 MG tablet Take 1 tablet (100 mg total) by mouth daily. 30 tablet 1  . venlafaxine XR (EFFEXOR-XR) 150 MG 24 hr capsule Take 1 capsule (150 mg total) by mouth daily with breakfast. 30 capsule 1   No current facility-administered medications for this visit.     Medical Decision Making:  Established Problem, Stable/Improving (1), Review of Medication Regimen & Side Effects (2) and Review of New Medication or Change in Dosage (2)  Treatment Plan Summary:Medication management and Plan   Patient has been on Effexor XR 150 mg.  She resumed Seroquel 100 mg at bedtime for augmentation and reports good benefit on this combination.  Generalized anxiety disorder-patient continue her Klonopin 0.5 daily   Continue  Topamax 100 mg by mouth daily at bedtime to help with the weight loss. She agreed with the plan.  Follow-up in one month or earlier depending on  her symptoms.    More than 50% of the time spent in psychoeducation, counseling and coordination of care.    This note was generated in part or whole with voice recognition software. Voice regonition is usually quite accurate but there are transcription errors that can and very often do occur. I apologize for any typographical errors that were not detected and corrected.   Rainey Pines, MD  03/27/2016, 1:34 PM

## 2016-04-24 ENCOUNTER — Ambulatory Visit: Payer: 59 | Admitting: Psychiatry

## 2016-05-01 ENCOUNTER — Ambulatory Visit (INDEPENDENT_AMBULATORY_CARE_PROVIDER_SITE_OTHER): Payer: Managed Care, Other (non HMO) | Admitting: Family Medicine

## 2016-05-01 ENCOUNTER — Encounter: Payer: Self-pay | Admitting: Family Medicine

## 2016-05-01 VITALS — BP 134/86 | HR 76 | Temp 98.2°F | Resp 18 | Wt 273.1 lb

## 2016-05-01 DIAGNOSIS — Z23 Encounter for immunization: Secondary | ICD-10-CM | POA: Diagnosis not present

## 2016-05-01 DIAGNOSIS — R0683 Snoring: Secondary | ICD-10-CM | POA: Diagnosis not present

## 2016-05-01 DIAGNOSIS — R5382 Chronic fatigue, unspecified: Secondary | ICD-10-CM

## 2016-05-01 DIAGNOSIS — M25531 Pain in right wrist: Secondary | ICD-10-CM | POA: Insufficient documentation

## 2016-05-01 MED ORDER — PREDNISONE 10 MG (48) PO TBPK: ORAL_TABLET | ORAL | 0 refills | Status: DC

## 2016-05-01 NOTE — Progress Notes (Signed)
Pre visit review using our clinic review tool, if applicable. No additional management support is needed unless otherwise documented below in the visit note. 

## 2016-05-01 NOTE — Patient Instructions (Signed)
Take the prednisone as prescribed.  We will call with your sleep study.  Follow up next year for routine follow up.  Take care  Dr. Lacinda Axon

## 2016-05-01 NOTE — Progress Notes (Addendum)
Subjective:  Patient ID: Paula Garrett, female    DOB: 08-28-1965  Age: 50 y.o. MRN: [id number]  CC: Wrist pain, Concern for OSA  HPI:  50 year old female with a complicated past medical history including hypertension, chronic fatigue, hypothyroidism, morbid obesity, tobacco abuse presents with the above complaints.  Right wrist pain  New problem.  Has been going on for the past 4 weeks.  Pain is located on the ulnar side of the wrist predominantly.  She does have some intermittent numbness and tingling. 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  Appears to be exacerbated by certain movements.  She's been taking Tylenol with no improvement. She does not take NSAIDs due to prior gastric bypass.  No associated swelling.  ? OSA  Patient reports chronic fatigue.  She also reports that she snores. She is morbidly obese.  She had spoken with her former doctor about this. She was scheduled for sleep study and never proceeded with it.  She would like to discuss having a sleep study.  Social Hx   Social History   Social History  . Marital status: Married    Spouse name: N/A  . Number of children: N/A  . Years of education: N/A   Social History Main Topics  . Smoking status: Current Every Day Smoker    Packs/day: 1.00    Years: 30.00    Types: Cigarettes    Start date: 03/15/1980  . Smokeless tobacco: Never Used  . Alcohol use 6.0 - 7.2 oz/week    2 Standard drinks or equivalent, 8 - 10 Glasses of wine per week  . Drug use: No  . Sexual activity: Yes    Partners: Male   Other Topics Concern  . None   Social History Narrative   Lives in [place] with husband.      Work - [employer] center      Diet - healthy diet   Exercise - limited   Caffeine use: daily   Review of Systems  Constitutional: Positive for fatigue.  Musculoskeletal:       Right wrist pain.   Objective:  BP 134/86 (BP  Location: Right Arm, Patient Position: Sitting, Cuff Size: Large)   Pulse 76   Temp 98.2 F (36.8 C) (Oral)   Resp 18   Wt 273 lb 2 oz (123.9 kg)   SpO2 95%   BMI 49.96 kg/m   BP/Weight 05/01/2016 03/27/2016 [id number]  Systolic BP [id number]  Diastolic BP 86 82 70  Wt. (Lbs) 273.13 267.4 -  BMI 49.96 48.91 -   Physical Exam  Constitutional: She is oriented to person, place, and time. She appears well-developed. No distress.  Morbidly obese.  Musculoskeletal:  Wrist: Inspection normal with no visible erythema or swelling. Pain with supination of wrist (ulnarly). Mild tenderness at the ulnar styloid. Negative Finkelstein, tinel's and phalens.  Neurological: She is alert and oriented to person, place, and time.  Psychiatric:  Morbidly obese.  Vitals reviewed.  Lab Results  Component Value Date   WBC 6.9 01/17/2016   HGB 12.5 01/17/2016   HCT 37.4 01/17/2016   PLT 254.0 01/17/2016   GLUCOSE 92 01/17/2016   CHOL 203 (H) 01/17/2016   TRIG 91.0 01/17/2016   HDL 84.70 01/17/2016   LDLCALC 100 (H) 01/17/2016   ALT 16 01/17/2016   AST 17 01/17/2016   NA 137 01/17/2016   K 4.2 01/17/2016   CL  105 01/17/2016   CREATININE 0.65 01/17/2016   BUN 14 01/17/2016   CO2 24 01/17/2016   TSH 0.07 (L) 01/17/2016   HGBA1C 5.6 01/17/2016   MICROALBUR 1.6 03/27/2015    Assessment & Plan:   Problem List Items Addressed This Visit    Snoring    Arranging sleep study.      Relevant Orders   Split night study   Right wrist pain - Primary    New problem. Appears to be secondary to tendonitis. I do not perform steroid injections on wrists (never been trained to do this). Trial of prednisone taper.      Chronic fatigue   Relevant Orders   Split night study    Other Visit Diagnoses    Encounter for immunization       Relevant Medications   predniSONE (STERAPRED UNI-PAK 48 TAB) 10 MG (48) TBPK tablet   Other Relevant Orders   Flu Vaccine QUAD 36+ mos IM (Completed)       Meds ordered this encounter  Medications  . predniSONE (STERAPRED UNI-PAK 48 TAB) 10 MG (48) TBPK tablet    Sig: Per package instructions    Dispense:  48 tablet    Refill:  0    Follow-up: PRN  [place]

## 2016-05-01 NOTE — Assessment & Plan Note (Signed)
New problem. Appears to be secondary to tendonitis. I do not perform steroid injections on wrists (never been trained to do this). Trial of prednisone taper.

## 2016-05-01 NOTE — Assessment & Plan Note (Signed)
Arranging sleep study. ?

## 2016-05-02 ENCOUNTER — Encounter: Payer: Self-pay | Admitting: Psychiatry

## 2016-05-02 ENCOUNTER — Ambulatory Visit (INDEPENDENT_AMBULATORY_CARE_PROVIDER_SITE_OTHER): Payer: 59 | Admitting: Psychiatry

## 2016-05-02 VITALS — BP 147/88 | HR 90 | Temp 97.8°F | Resp 17 | Wt 273.0 lb

## 2016-05-02 DIAGNOSIS — F331 Major depressive disorder, recurrent, moderate: Secondary | ICD-10-CM

## 2016-05-02 MED ORDER — CLONAZEPAM 0.5 MG PO TABS: 0.5000 mg | ORAL_TABLET | Freq: Two times a day (BID) | ORAL | 1 refills | Status: DC

## 2016-05-02 MED ORDER — PHENTERMINE HCL 30 MG PO CAPS: 30.0000 mg | ORAL_CAPSULE | ORAL | 0 refills | Status: DC

## 2016-05-02 MED ORDER — VENLAFAXINE HCL ER 150 MG PO CP24: 150.0000 mg | ORAL_CAPSULE | Freq: Every day | ORAL | 1 refills | Status: DC

## 2016-05-02 MED ORDER — GABAPENTIN 300 MG PO CAPS: 300.0000 mg | ORAL_CAPSULE | Freq: Every day | ORAL | 1 refills | Status: DC

## 2016-05-02 MED ORDER — TOPIRAMATE 100 MG PO TABS: 100.0000 mg | ORAL_TABLET | Freq: Every day | ORAL | 1 refills | Status: DC

## 2016-05-02 NOTE — Progress Notes (Signed)
BH MD/PA/NP OP Progress Note  05/02/2016 2:35 PM Paula Garrett  MRN:  [id number]  Subjective:  Patient is a 50 year old female who presented for the follow-up appointment. She was tearful during the interview. She reported that she is unable to lose any weight. She reported that she has noticed increase in her weight since her last appointment. 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 She is trying to lose weight and has been eating less but her weight continues to go higher. She reported that she has also tried phentermine in the past. She wants to start the medication again. We discussed about her medications. She wants to go higher on the dose of the phentermine. We discussed about stopping the Seroquel and she agreed with the plan. Patient appears anxious and apprehensive. She reported that her father is coming for the holidays. She is compliant with her medications. She denied having any suicidal ideations or plans at this time.      She currently denied having any mood swings anger anxiety or paranoia    Visit Diagnosis:     ICD-9-CM ICD-10-CM   1. 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 Anxiety   . Arthritis   . Asthma   . Depression   . GERD (gastroesophageal reflux disease)   . Hypertension   . Hypothyroidism   . Pneumonia   . Thyroid disease     Past Surgical History:  Procedure Laterality Date  . ABLATION  2006  . BREAST BIOPSY Left 1998  . BREAST SURGERY     cyst aspiration  . BREAST SURGERY  1998   cyst aspiration  . COLONOSCOPY  03/2014  . ENDOMETRIAL ABLATION    . ESOPHAGOGASTRODUODENOSCOPY (EGD) WITH PROPOFOL N/A 08/15/2015   Procedure: ESOPHAGOGASTRODUODENOSCOPY (EGD) WITH PROPOFOL;  Surgeon: Lollie Sails, MD;  Location: [facility] ENDOSCOPY;  Service: Endoscopy;  Laterality: N/A;  .  ESOPHAGOGASTRODUODENOSCOPY (EGD) WITH PROPOFOL N/A 01/22/2016   Procedure: ESOPHAGOGASTRODUODENOSCOPY (EGD) WITH PROPOFOL;  Surgeon: Lollie Sails, MD;  Location: [facility] ENDOSCOPY;  Service: Endoscopy;  Laterality: N/A;  . GASTRIC BYPASS  2005  . HERNIA REPAIR    . NASAL SINUS SURGERY    . TOTAL THYROIDECTOMY    . TUBAL LIGATION     Family History:  Family History  Problem Relation Age of Onset  . Heart disease Mother   . COPD Mother   . Arthritis Mother   . Cancer Mother     uterine  . Lupus Mother   . Anxiety disorder Mother   . Depression Mother   . Drug abuse Mother   . COPD Father   . Arthritis Father   . Heart disease Father   . Heart attack Father   . Alcohol abuse Father   . Heart disease Brother   . Heart attack Brother   . Drug abuse Brother   . Anxiety disorder Brother   . Depression Brother   . Heart disease Brother   . Diabetes Maternal Grandmother   . Diabetes Maternal Grandfather   . Diabetes Paternal Grandmother   . Diabetes Paternal Grandfather    Social History:  Social History   Social History  . Marital status: Married    Spouse name: N/A  . Number of children: N/A  . Years of education: N/A   Social History  Main Topics  . Smoking status: Current Every Day Smoker    Packs/day: 1.00    Years: 30.00    Types: Cigarettes    Start date: 03/15/1980  . Smokeless tobacco: Never Used  . Alcohol use 6.0 - 7.2 oz/week    2 Standard drinks or equivalent, 8 - 10 Glasses of wine per week  . Drug use: No  . Sexual activity: Yes    Partners: Male   Other Topics Concern  . Not on file   Social History Narrative   Lives in [place] with husband.      Work - [employer]      Diet - healthy diet   Exercise - limited   Caffeine use: daily   Additional History:   Assessment:   Musculoskeletal: Strength & Muscle Tone: within normal limits Gait & Station: normal Patient leans: N/A  Psychiatric Specialty Exam: Medication Refill     Review  of Systems  Musculoskeletal: Negative for joint pain.  Psychiatric/Behavioral: Negative for depression, hallucinations, memory loss, substance abuse and suicidal ideas. The patient is not nervous/anxious and does not have insomnia.   All other systems reviewed and are negative.   There were no vitals taken for this visit.There is no height or weight on file to calculate BMI.  General Appearance: Well Groomed  Eye Contact:  Good  Speech:  Normal Rate  Volume:  Normal  Mood:  Anxious and Depressed  Affect:  Depressed and Tearful  Thought Process:  Linear  Orientation:  Full (Time, Place, and Person)  Thought Content:  WDL  Suicidal Thoughts:  No  Homicidal Thoughts:  No  Memory:  Immediate;   Good Recent;   Good Remote;   Good  Judgement:  Good  Insight:  Good  Psychomotor Activity:  Normal  Concentration:  Good  Recall:  Good  Fund of Knowledge: Good  Language: Good  Akathisia:  Negative  Handed:   AIMS (if indicated):  Done 05/16/15 and normal  Assets:  Communication Skills Desire for Improvement Social Support Talents/Skills  ADL's:  Intact  Cognition: WNL  Sleep:  good   Is the patient at risk to self?  No. Has the patient been a risk to self in the past 6 months?  No. Has the patient been a risk to self within the distant past?  No. Is the patient a risk to others?  No. Has the patient been a risk to others in the past 6 months?  No. Has the patient been a risk to others within the distant past?  No.  Current Medications: Current Outpatient Prescriptions  Medication Sig Dispense Refill  . budesonide-formoterol (SYMBICORT) 80-4.5 MCG/ACT inhaler Inhale 2 puffs into the lungs 2 (two) times daily. 1 Inhaler 3  . cetirizine (ZYRTEC) 10 MG tablet Take by mouth.    . Cholecalciferol (VITAMIN D) 2000 units CAPS Take by mouth.    . clonazePAM (KLONOPIN) 0.5 MG tablet Take 1 tablet (0.5 mg total) by mouth 2 (two) times daily. 60 tablet 1  . gabapentin (NEURONTIN) 300 MG  capsule Take 1 capsule (300 mg total) by mouth at bedtime. 30 capsule 1  . lisinopril (PRINIVIL,ZESTRIL) 20 MG tablet Take 1 tablet (20 mg total) by mouth daily. 90 tablet 3  . montelukast (SINGULAIR) 10 MG tablet Take 1 tablet (10 mg total) by mouth at bedtime. 30 tablet 3  . pantoprazole (PROTONIX) 40 MG tablet TAKE ONE TABLET BY MOUTH ONCE DAILY 90 tablet 3  . predniSONE (STERAPRED  UNI-PAK 48 TAB) 10 MG (48) TBPK tablet Per package instructions 48 tablet 0  . QUEtiapine (SEROQUEL) 100 MG tablet Take 1 tablet (100 mg total) by mouth at bedtime. 30 tablet 1  . SYNTHROID 125 MCG tablet TAKE TWO TABLETS BY MOUTH ONCE DAILY 120 tablet 1  . topiramate (TOPAMAX) 100 MG tablet Take 1 tablet (100 mg total) by mouth daily. 30 tablet 1  . venlafaxine XR (EFFEXOR-XR) 150 MG 24 hr capsule Take 1 capsule (150 mg total) by mouth daily with breakfast. 30 capsule 1  . sucralfate (CARAFATE) 1 g tablet Take by mouth.     No current facility-administered medications for this visit.     Medical Decision Making:  Established Problem, Stable/Improving (1), Review of Medication Regimen & Side Effects (2) and Review of New Medication or Change in Dosage (2)  Treatment Plan Summary:Medication management and Plan   Patient has been on Effexor XR 150 mg. Continue Seroquel  Generalized anxiety disorder-patient continue her Klonopin 0.5 BID  Continue  Topamax 100 mg by mouth daily at bedtime to help with the weight loss. She agreed with the plan. I will start her on phentermine 30 mg daily. Advised her about the side effects and she agreed with the plan.  Follow-up in one month or earlier depending on her symptoms.    More than 50% of the time spent in psychoeducation, counseling and coordination of care.    This note was generated in part or whole with voice recognition software. Voice regonition is usually quite accurate but there are transcription errors that can and very often do occur. I apologize for any  typographical errors that were not detected and corrected.   Rainey Pines, MD  05/02/2016, 2:35 PM

## 2016-05-29 ENCOUNTER — Encounter: Payer: Self-pay | Admitting: Psychiatry

## 2016-05-29 ENCOUNTER — Ambulatory Visit (INDEPENDENT_AMBULATORY_CARE_PROVIDER_SITE_OTHER): Payer: 59 | Admitting: Psychiatry

## 2016-05-29 VITALS — BP 129/87 | HR 111 | Wt 265.2 lb

## 2016-05-29 DIAGNOSIS — F5081 Binge eating disorder: Secondary | ICD-10-CM

## 2016-05-29 DIAGNOSIS — F331 Major depressive disorder, recurrent, moderate: Secondary | ICD-10-CM

## 2016-05-29 MED ORDER — CLONAZEPAM 0.5 MG PO TABS: 0.5000 mg | ORAL_TABLET | Freq: Two times a day (BID) | ORAL | 1 refills | Status: DC

## 2016-05-29 MED ORDER — TOPIRAMATE 100 MG PO TABS: 100.0000 mg | ORAL_TABLET | Freq: Every day | ORAL | 1 refills | Status: DC

## 2016-05-29 MED ORDER — PHENTERMINE HCL 30 MG PO CAPS: 30.0000 mg | ORAL_CAPSULE | ORAL | 0 refills | Status: DC

## 2016-05-29 MED ORDER — VENLAFAXINE HCL ER 150 MG PO CP24: 150.0000 mg | ORAL_CAPSULE | Freq: Every day | ORAL | 1 refills | Status: DC

## 2016-05-29 MED ORDER — GABAPENTIN 300 MG PO CAPS: 300.0000 mg | ORAL_CAPSULE | Freq: Every day | ORAL | 1 refills | Status: DC

## 2016-05-29 NOTE — Progress Notes (Signed)
BH MD/PA/NP OP Progress Note  05/29/2016 3:06 PM Paula Garrett  MRN:  [id number]  Subjective:  Patient is a 50 year old female who presented for the follow-up appointment.She reported that she is trying to lose weight and she has almost lost 10 pounds since her last appointment. She was happy and reported that she has been compliant with her medications. She reported that she is taking her medications as prescribed. She is not having any side effects of the medications. She reported that she is willing to stop the Seroquel at this time. Patient currently denied having any suicidal ideations or plans. She reported that she slept well last night. She is anxious about the upcoming holidays as she is trying to eat less. She denied having any suicidal homicidal ideations or plans.  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 Binge eating disorder 307.50 F50.81     Past Medical History:  Past Medical History:  Diagnosis Date  . Anxiety   . Arthritis   . Asthma   . Depression   . GERD (gastroesophageal reflux disease)   . Hypertension   . Hypothyroidism   . Pneumonia   . Thyroid disease     Past Surgical History:  Procedure Laterality Date  . ABLATION  2006  . BREAST BIOPSY Left 1998  . BREAST SURGERY     cyst aspiration  . BREAST SURGERY  1998   cyst aspiration  . COLONOSCOPY  03/2014  . ENDOMETRIAL ABLATION    . ESOPHAGOGASTRODUODENOSCOPY (EGD) WITH PROPOFOL N/A 08/15/2015   Procedure: ESOPHAGOGASTRODUODENOSCOPY (EGD) WITH PROPOFOL;  Surgeon: Lollie Sails, MD;  Location: [facility] ENDOSCOPY;  Service: Endoscopy;  Laterality: N/A;  . ESOPHAGOGASTRODUODENOSCOPY (EGD) WITH PROPOFOL N/A 01/22/2016    Procedure: ESOPHAGOGASTRODUODENOSCOPY (EGD) WITH PROPOFOL;  Surgeon: Lollie Sails, MD;  Location: [facility] ENDOSCOPY;  Service: Endoscopy;  Laterality: N/A;  . GASTRIC BYPASS  2005  . HERNIA REPAIR    . NASAL SINUS SURGERY    . TOTAL THYROIDECTOMY    . TUBAL LIGATION     Family History:  Family History  Problem Relation Age of Onset  . Heart disease Mother   . COPD Mother   . Arthritis Mother   . Cancer Mother     uterine  . Lupus Mother   . Anxiety disorder Mother   . Depression Mother   . Drug abuse Mother   . COPD Father   . Arthritis Father   . Heart disease Father   . Heart attack Father   . Alcohol abuse Father   . Heart disease Brother   . Heart attack Brother   . Drug abuse Brother   . Anxiety disorder Brother   . Depression Brother   . Heart disease Brother   . Diabetes Maternal Grandmother   . Diabetes Maternal Grandfather   . Diabetes Paternal Grandmother   . Diabetes Paternal Grandfather    Social History:  Social History   Social History  . Marital status: Married    Spouse name: N/A  . Number of children: N/A  . Years of education: N/A   Social History Main Topics  . Smoking status: Current Every Day Smoker  Packs/day: 1.00    Years: 30.00    Types: Cigarettes    Start date: 03/15/1980  . Smokeless tobacco: Never Used  . Alcohol use 6.0 - 7.2 oz/week    2 Standard drinks or equivalent, 8 - 10 Glasses of wine per week  . Drug use: No  . Sexual activity: Yes    Partners: Male   Other Topics Concern  . None   Social History Narrative   Lives in [place] with husband.      Work - [employer]      Diet - healthy diet   Exercise - limited   Caffeine use: daily   Additional History:   Assessment:   Musculoskeletal: Strength & Muscle Tone: within normal limits Gait & Station: normal Patient leans: N/A  Psychiatric Specialty Exam: Medication Refill     Review of Systems  Musculoskeletal: Negative for joint pain.   Psychiatric/Behavioral: Negative for depression, hallucinations, memory loss, substance abuse and suicidal ideas. The patient is not nervous/anxious and does not have insomnia.   All other systems reviewed and are negative.   Blood pressure 129/87, pulse (!) 111, weight 265 lb 3.2 oz (120.3 kg).Body mass index is 48.51 kg/m.  General Appearance: Well Groomed  Eye Contact:  Good  Speech:  Normal Rate  Volume:  Normal  Mood:  Anxious and Depressed  Affect:  Depressed and Tearful  Thought Process:  Linear  Orientation:  Full (Time, Place, and Person)  Thought Content:  WDL  Suicidal Thoughts:  No  Homicidal Thoughts:  No  Memory:  Immediate;   Good Recent;   Good Remote;   Good  Judgement:  Good  Insight:  Good  Psychomotor Activity:  Normal  Concentration:  Good  Recall:  Good  Fund of Knowledge: Good  Language: Good  Akathisia:  Negative  Handed:   AIMS (if indicated):  Done 05/16/15 and normal  Assets:  Communication Skills Desire for Improvement Social Support Talents/Skills  ADL's:  Intact  Cognition: WNL  Sleep:  good   Is the patient at risk to self?  No. Has the patient been a risk to self in the past 6 months?  No. Has the patient been a risk to self within the distant past?  No. Is the patient a risk to others?  No. Has the patient been a risk to others in the past 6 months?  No. Has the patient been a risk to others within the distant past?  No.  Current Medications: Current Outpatient Prescriptions  Medication Sig Dispense Refill  . budesonide-formoterol (SYMBICORT) 80-4.5 MCG/ACT inhaler Inhale 2 puffs into the lungs 2 (two) times daily. 1 Inhaler 3  . cetirizine (ZYRTEC) 10 MG tablet Take by mouth.    . Cholecalciferol (VITAMIN D) 2000 units CAPS Take by mouth.    . clonazePAM (KLONOPIN) 0.5 MG tablet Take 1 tablet (0.5 mg total) by mouth 2 (two) times daily. 60 tablet 1  . gabapentin (NEURONTIN) 300 MG capsule Take 1 capsule (300 mg total) by mouth at  bedtime. 30 capsule 1  . lisinopril (PRINIVIL,ZESTRIL) 20 MG tablet Take 1 tablet (20 mg total) by mouth daily. 90 tablet 3  . montelukast (SINGULAIR) 10 MG tablet Take 1 tablet (10 mg total) by mouth at bedtime. 30 tablet 3  . pantoprazole (PROTONIX) 40 MG tablet TAKE ONE TABLET BY MOUTH ONCE DAILY 90 tablet 3  . phentermine 30 MG capsule Take 1 capsule (30 mg total) by mouth every morning. 30 capsule 0  .  predniSONE (STERAPRED UNI-PAK 48 TAB) 10 MG (48) TBPK tablet Per package instructions 48 tablet 0  . SYNTHROID 125 MCG tablet TAKE TWO TABLETS BY MOUTH ONCE DAILY 120 tablet 1  . topiramate (TOPAMAX) 100 MG tablet Take 1 tablet (100 mg total) by mouth daily. 30 tablet 1  . venlafaxine XR (EFFEXOR-XR) 150 MG 24 hr capsule Take 1 capsule (150 mg total) by mouth daily with breakfast. 30 capsule 1  . sucralfate (CARAFATE) 1 g tablet Take by mouth.     No current facility-administered medications for this visit.     Medical Decision Making:  Established Problem, Stable/Improving (1), Review of Medication Regimen & Side Effects (2) and Review of New Medication or Change in Dosage (2)  Treatment Plan Summary:Medication management and Plan   Patient has been on Effexor XR 150 mg.  She'll start tapering the dose of the Seroquel to 50 mg and then stop. Generalized anxiety disorder-patient continue her Klonopin 0.5 BID  Continue  Topamax 100 mg by mouth daily at bedtime to help with the weight loss. She agreed with the plan. Continue  phentermine 30 mg daily. Advised her about the side effects and she agreed with the plan.  Follow-up in one month or earlier depending on her symptoms.    More than 50% of the time spent in psychoeducation, counseling and coordination of care.    This note was generated in part or whole with voice recognition software. Voice regonition is usually quite accurate but there are transcription errors that can and very often do occur. I apologize for any typographical  errors that were not detected and corrected.   Rainey Pines, MD  05/29/2016, 3:06 PM

## 2016-06-07 ENCOUNTER — Encounter: Payer: Self-pay | Admitting: Physician Assistant

## 2016-06-07 ENCOUNTER — Ambulatory Visit: Payer: Self-pay | Admitting: Physician Assistant

## 2016-06-07 VITALS — BP 140/80 | HR 97 | Temp 97.8°F | Wt 264.0 lb

## 2016-06-07 DIAGNOSIS — N898 Other specified noninflammatory disorders of vagina: Secondary | ICD-10-CM

## 2016-06-07 DIAGNOSIS — N390 Urinary tract infection, site not specified: Secondary | ICD-10-CM

## 2016-06-07 LAB — POCT URINALYSIS DIPSTICK
Bilirubin, UA: NEGATIVE
Glucose, UA: NEGATIVE
KETONES UA: NEGATIVE
Nitrite, UA: NEGATIVE
PROTEIN UA: NEGATIVE
RBC UA: NEGATIVE
SPEC GRAV UA: 1.02
Urobilinogen, UA: 0.2
pH, UA: 5

## 2016-06-07 MED ORDER — METRONIDAZOLE 500 MG PO TABS: 500.0000 mg | ORAL_TABLET | Freq: Three times a day (TID) | ORAL | 0 refills | Status: DC

## 2016-06-07 MED ORDER — FLUCONAZOLE 150 MG PO TABS: ORAL_TABLET | ORAL | 0 refills | Status: DC

## 2016-06-07 MED ORDER — CIPROFLOXACIN HCL 500 MG PO TABS: 500.0000 mg | ORAL_TABLET | Freq: Two times a day (BID) | ORAL | 0 refills | Status: DC

## 2016-06-07 NOTE — Progress Notes (Addendum)
S:  C/o uti sx for 2 days, , urgency, frequency, denies vaginal discharge, abdominal pain or flank pain, no fever/chills, states she does have an odor from the vaginal area that smells like rotten cheese, is really strong odor, no discharge, no recent sexual activity Remainder ros neg  O:  Vitals wnl, nad, no cva tenderness, back nontender, lungs c t a,cv rrr, abd soft nontender, bs normal, n/v intact  A: uti, vaginal odor  P: cipro 500mg  bid x 3d, flagyl 500mg  tid x 7d, no etoh with medication, diflucan 150mg  1 now and 1 in a week; increase water intake, add cranberry juice, return if not improving in 2 -3 days, return earlier if worsening, discussed pyelonephritis sx   Urine culture + for ecoli, is resistant to cipro so sent rx for macrobid to [facility]

## 2016-06-10 LAB — URINE CULTURE

## 2016-06-11 MED ORDER — NITROFURANTOIN MONOHYD MACRO 100 MG PO CAPS: 100.0000 mg | ORAL_CAPSULE | Freq: Two times a day (BID) | ORAL | 0 refills | Status: DC

## 2016-06-11 NOTE — Addendum Note (Signed)
Addended by: Versie Starks on: 06/11/2016 08:49 AM   Modules accepted: Orders

## 2016-06-24 ENCOUNTER — Telehealth: Payer: Self-pay | Admitting: Family Medicine

## 2016-06-24 ENCOUNTER — Other Ambulatory Visit: Payer: Self-pay | Admitting: Family Medicine

## 2016-06-24 NOTE — Telephone Encounter (Signed)
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 Please transfer call back to me .

## 2016-06-25 NOTE — Telephone Encounter (Signed)
Pt called and left a voicemail stating for me to call her on her work number which is [phone].

## 2016-06-25 NOTE — Telephone Encounter (Signed)
Pt was called and the questions were given. She answered now form will be faxed.

## 2016-07-05 ENCOUNTER — Ambulatory Visit: Payer: 59 | Admitting: Psychiatry

## 2016-07-15 ENCOUNTER — Other Ambulatory Visit: Payer: Self-pay | Admitting: Family Medicine

## 2016-07-24 ENCOUNTER — Encounter: Payer: Self-pay | Admitting: Psychiatry

## 2016-07-24 ENCOUNTER — Ambulatory Visit (INDEPENDENT_AMBULATORY_CARE_PROVIDER_SITE_OTHER): Payer: 59 | Admitting: Psychiatry

## 2016-07-24 VITALS — BP 141/85 | HR 109 | Temp 97.3°F | Wt 240.2 lb

## 2016-07-24 DIAGNOSIS — F5081 Binge eating disorder: Secondary | ICD-10-CM | POA: Diagnosis not present

## 2016-07-24 DIAGNOSIS — F331 Major depressive disorder, recurrent, moderate: Secondary | ICD-10-CM

## 2016-07-24 MED ORDER — TOPIRAMATE 100 MG PO TABS: 100.0000 mg | ORAL_TABLET | Freq: Every day | ORAL | 1 refills | Status: DC

## 2016-07-24 MED ORDER — CLONAZEPAM 0.5 MG PO TABS: 0.5000 mg | ORAL_TABLET | Freq: Every day | ORAL | 1 refills | Status: DC

## 2016-07-24 MED ORDER — PHENTERMINE HCL 30 MG PO CAPS: 30.0000 mg | ORAL_CAPSULE | ORAL | 1 refills | Status: DC

## 2016-07-24 MED ORDER — GABAPENTIN 300 MG PO CAPS: 300.0000 mg | ORAL_CAPSULE | Freq: Every day | ORAL | 1 refills | Status: DC

## 2016-07-24 MED ORDER — VENLAFAXINE HCL ER 150 MG PO CP24: 150.0000 mg | ORAL_CAPSULE | Freq: Every day | ORAL | 1 refills | Status: DC

## 2016-07-24 NOTE — Progress Notes (Signed)
BH MD/PA/NP OP Progress Note  07/24/2016 3:24 PM Paula Garrett  MRN:  [id number]  Subjective:  Patient is a 51 year old female who presented for the follow-up appointment.She was sad and tearful during the interview. She reported that after her last appointment she has separated from her husband and is currently living with her friends. She reported that her husband has filed for separation. She was tearful during the interview. She reported that she has been looking for another place for herself. She reported that she is not planning to go back with him. Patient reported that she has lost almost 35 pounds since she has been started on the medication. She is doing well. She has started drinking since she has been separated from her husband as she has been feeling depressed. She reported that she drank 6 beers and to sort last night. Her blood pressure and pulse were elevated. We discussed about decreasing the dose of the Klonopin at this time.   She denied having any suicidal homicidal ideations or plans.   she was advised to start following with a therapist. Chief Complaint    Follow-up; Medication Refill     Visit Diagnosis:     ICD-9-CM ICD-10-CM   1. 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 Binge eating disorder 307.50 F50.81     Past Medical History:  Past Medical History:  Diagnosis Date  . Anxiety   . Arthritis   . Asthma   . Depression   . GERD (gastroesophageal reflux disease)   . Hypertension   . Hypothyroidism   . Pneumonia   . Thyroid disease     Past Surgical History:  Procedure Laterality Date  . ABLATION  2006  . BREAST BIOPSY Left 1998  . BREAST SURGERY     cyst aspiration  . BREAST SURGERY  1998   cyst aspiration  . COLONOSCOPY  03/2014  . ENDOMETRIAL ABLATION    . ESOPHAGOGASTRODUODENOSCOPY (EGD) WITH PROPOFOL N/A 08/15/2015   Procedure: ESOPHAGOGASTRODUODENOSCOPY (EGD) WITH PROPOFOL;  Surgeon: Lollie Sails,  MD;  Location: [facility] ENDOSCOPY;  Service: Endoscopy;  Laterality: N/A;  . ESOPHAGOGASTRODUODENOSCOPY (EGD) WITH PROPOFOL N/A 01/22/2016   Procedure: ESOPHAGOGASTRODUODENOSCOPY (EGD) WITH PROPOFOL;  Surgeon: Lollie Sails, MD;  Location: [facility] ENDOSCOPY;  Service: Endoscopy;  Laterality: N/A;  . GASTRIC BYPASS  2005  . HERNIA REPAIR    . NASAL SINUS SURGERY    . TOTAL THYROIDECTOMY    . TUBAL LIGATION     Family History:  Family History  Problem Relation Age of Onset  . Heart disease Mother   . COPD Mother   . Arthritis Mother   . Cancer Mother     uterine  . Lupus Mother   . Anxiety disorder Mother   . Depression Mother   . Drug abuse Mother   . COPD Father   . Arthritis Father   . Heart disease Father   . Heart attack Father   . Alcohol abuse Father   . Heart disease Brother   . Heart attack Brother   . Drug abuse Brother   . Anxiety disorder Brother   . Depression Brother   . Heart disease Brother   . Diabetes Maternal Grandmother   . Diabetes Maternal Grandfather   . Diabetes Paternal Grandmother   . Diabetes Paternal Grandfather    Social History:  Social History   Social History  . Marital status: Married    Spouse name: N/A  .  Number of children: N/A  . Years of education: N/A   Social History Main Topics  . Smoking status: Current Every Day Smoker    Packs/day: 1.00    Years: 30.00    Types: Cigarettes    Start date: 03/15/1980  . Smokeless tobacco: Never Used  . Alcohol use 6.0 - 7.2 oz/week    2 Standard drinks or equivalent, 8 - 10 Glasses of wine per week  . Drug use: No  . Sexual activity: Yes    Partners: Male   Other Topics Concern  . None   Social History Narrative   Lives in [place] with husband.      Work - [employer]      Diet - healthy diet   Exercise - limited   Caffeine use: daily   Additional History:   Assessment:   Musculoskeletal: Strength & Muscle Tone: within normal limits Gait & Station: normal Patient leans:  N/A  Psychiatric Specialty Exam: Medication Refill     Review of Systems  Musculoskeletal: Negative for joint pain.  Psychiatric/Behavioral: Negative for depression, hallucinations, memory loss, substance abuse and suicidal ideas. The patient is not nervous/anxious and does not have insomnia.   All other systems reviewed and are negative.   Blood pressure (!) 141/85, pulse (!) 109, temperature 97.3 F (36.3 C), temperature source Oral, weight 240 lb 3.2 oz (109 kg).Body mass index is 43.93 kg/m.  General Appearance: Well Groomed  Eye Contact:  Good  Speech:  Normal Rate  Volume:  Normal  Mood:  Anxious and Depressed  Affect:  Depressed and Tearful  Thought Process:  Linear  Orientation:  Full (Time, Place, and Person)  Thought Content:  WDL  Suicidal Thoughts:  No  Homicidal Thoughts:  No  Memory:  Immediate;   Good Recent;   Good Remote;   Good  Judgement:  Good  Insight:  Good  Psychomotor Activity:  Normal  Concentration:  Good  Recall:  Good  Fund of Knowledge: Good  Language: Good  Akathisia:  Negative  Handed:   AIMS (if indicated):  Done 05/16/15 and normal  Assets:  Communication Skills Desire for Improvement Social Support Talents/Skills  ADL's:  Intact  Cognition: WNL  Sleep:  good   Is the patient at risk to self?  No. Has the patient been a risk to self in the past 6 months?  No. Has the patient been a risk to self within the distant past?  No. Is the patient a risk to others?  No. Has the patient been a risk to others in the past 6 months?  No. Has the patient been a risk to others within the distant past?  No.  Current Medications: Current Outpatient Prescriptions  Medication Sig Dispense Refill  . budesonide-formoterol (SYMBICORT) 80-4.5 MCG/ACT inhaler Inhale 2 puffs into the lungs 2 (two) times daily. 1 Inhaler 3  . cetirizine (ZYRTEC) 10 MG tablet Take by mouth.    . Cholecalciferol (VITAMIN D) 2000 units CAPS Take by mouth.    . clonazePAM  (KLONOPIN) 0.5 MG tablet Take 1 tablet (0.5 mg total) by mouth daily. 30 tablet 1  . gabapentin (NEURONTIN) 300 MG capsule Take 1 capsule (300 mg total) by mouth at bedtime. 30 capsule 1  . lisinopril (PRINIVIL,ZESTRIL) 20 MG tablet Take 1 tablet (20 mg total) by mouth daily. 90 tablet 3  . montelukast (SINGULAIR) 10 MG tablet Take 1 tablet (10 mg total) by mouth at bedtime. 30 tablet 3  . pantoprazole (  PROTONIX) 40 MG tablet TAKE ONE TABLET BY MOUTH ONCE DAILY 90 tablet 3  . phentermine 30 MG capsule Take 1 capsule (30 mg total) by mouth every morning. 30 capsule 1  . SYNTHROID 125 MCG tablet TAKE TWO TABLETS BY MOUTH ONCE DAILY 180 tablet 3  . topiramate (TOPAMAX) 100 MG tablet Take 1 tablet (100 mg total) by mouth daily. 30 tablet 1  . venlafaxine XR (EFFEXOR-XR) 150 MG 24 hr capsule Take 1 capsule (150 mg total) by mouth daily with breakfast. 30 capsule 1  . sucralfate (CARAFATE) 1 g tablet Take by mouth.     No current facility-administered medications for this visit.     Medical Decision Making:  Established Problem, Stable/Improving (1), Review of Medication Regimen & Side Effects (2) and Review of New Medication or Change in Dosage (2)  Treatment Plan Summary:Medication management and Plan   Patient has been on Effexor XR 150 mg.  Generalized anxiety disorder-patient continue her Klonopin 0.5 qhs  Continue  Topamax 100 mg by mouth daily at bedtime to help with the weight loss. She agreed with the plan. Continue  phentermine 30 mg daily. Advised her about the side effects and she agreed with the plan. Advised patient to start seeing a therapist on a regular basis and she agreed with the plan.  Follow-up in one month or earlier depending on her symptoms.    More than 50% of the time spent in psychoeducation, counseling and coordination of care.    This note was generated in part or whole with voice recognition software. Voice regonition is usually quite accurate but there are  transcription errors that can and very often do occur. I apologize for any typographical errors that were not detected and corrected.   Rainey Pines, MD  07/24/2016, 3:24 PM

## 2016-08-13 ENCOUNTER — Other Ambulatory Visit: Payer: Self-pay

## 2016-08-13 MED ORDER — LISINOPRIL 20 MG PO TABS: 20.0000 mg | ORAL_TABLET | Freq: Every day | ORAL | 3 refills | Status: DC

## 2016-08-13 NOTE — Telephone Encounter (Signed)
Refilled on 05/31/2015 with 3 refills  Last Ov: 01/17/2016  Follow up not scheduled. Please advise.

## 2016-08-16 ENCOUNTER — Ambulatory Visit: Payer: Self-pay | Admitting: Physician Assistant

## 2016-08-16 ENCOUNTER — Encounter: Payer: Self-pay | Admitting: Physician Assistant

## 2016-08-16 VITALS — BP 159/90 | HR 110 | Temp 97.0°F | Ht 64.0 in | Wt 233.0 lb

## 2016-08-16 DIAGNOSIS — Z0000 Encounter for general adult medical examination without abnormal findings: Secondary | ICD-10-CM

## 2016-08-16 NOTE — Progress Notes (Signed)
   Subjective:Biometric physical    Patient ID: Paula Garrett, female    DOB: August 23, 1965, 51 y.o.   MRN: [id number]  HPI Patient here for annual physical for job.   Review of Systems Hypertension, Hyperthyroidism, and Depression.    Objective:   Physical Exam Obese.  HEENT unremarkable. Neck supple w/o adenopathy. Lungs CTA. Heart tachycardic at 110. BP elevate 159/90. Did not take BP medication this morning.         Assessment & Plan:annual physical  Follow up with PCP.

## 2016-08-23 ENCOUNTER — Other Ambulatory Visit: Payer: Self-pay | Admitting: Family Medicine

## 2016-08-23 MED ORDER — LISINOPRIL 20 MG PO TABS: 20.0000 mg | ORAL_TABLET | Freq: Every day | ORAL | 2 refills | Status: DC

## 2016-09-04 ENCOUNTER — Encounter: Payer: Self-pay | Admitting: Psychiatry

## 2016-09-04 ENCOUNTER — Ambulatory Visit (INDEPENDENT_AMBULATORY_CARE_PROVIDER_SITE_OTHER): Payer: 59 | Admitting: Psychiatry

## 2016-09-04 VITALS — BP 137/84 | HR 92 | Temp 97.4°F | Wt 227.0 lb

## 2016-09-04 DIAGNOSIS — F331 Major depressive disorder, recurrent, moderate: Secondary | ICD-10-CM | POA: Diagnosis not present

## 2016-09-04 DIAGNOSIS — F5081 Binge eating disorder: Secondary | ICD-10-CM

## 2016-09-04 MED ORDER — CLONAZEPAM 0.5 MG PO TABS: 0.5000 mg | ORAL_TABLET | Freq: Every day | ORAL | 1 refills | Status: DC

## 2016-09-04 MED ORDER — TOPIRAMATE 100 MG PO TABS: 100.0000 mg | ORAL_TABLET | Freq: Every day | ORAL | 1 refills | Status: DC

## 2016-09-04 MED ORDER — PHENTERMINE HCL 30 MG PO CAPS: 30.0000 mg | ORAL_CAPSULE | ORAL | 1 refills | Status: DC

## 2016-09-04 MED ORDER — GABAPENTIN 300 MG PO CAPS: 300.0000 mg | ORAL_CAPSULE | Freq: Every day | ORAL | 1 refills | Status: DC

## 2016-09-04 MED ORDER — VENLAFAXINE HCL ER 150 MG PO CP24: 150.0000 mg | ORAL_CAPSULE | Freq: Every day | ORAL | 1 refills | Status: DC

## 2016-09-04 NOTE — Progress Notes (Signed)
BH MD/PA/NP OP Progress Note  09/04/2016 11:21 AM Paula Garrett  MRN:  [id number]  Subjective:  Patient is a 51 year old female who presented for the follow-up appointment.She was off and alert during the interview. She reported that she is very excited that she has started losing weight. She is eating healthy and has been eating less during the daytime. She reported that she has been exercising on a regular basis and has been eating healthy. She is also taking Topamax on a regular basis. She reported that she does not take phentermine as prescribed. Patient reported that she has lost almost 13 pounds since her last appointment. Patient reported that she is looking for her apartment and her brother rents out  apartment and is waiting for her turn. She is currently renting a room with her friend. Patient reported that she wants to keep  her space clean. She is excited as her son is getting married in May. Patient reported that she is losing weight for his wedding plan. She appeared calm and alert during the interview. She denied using drugs or alcohol at this time. She is also smoking less now. She denied having any perceptual disturbances she denied suicidal ideations or plans.   Chief Complaint    Follow-up; Medication Refill     Visit Diagnosis:     ICD-9-CM ICD-10-CM   1. 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 Binge eating disorder 307.50 F50.81     Past Medical History:  Past Medical History:  Diagnosis Date  . Anxiety   . Arthritis   . Asthma   . Depression   . GERD (gastroesophageal reflux disease)   . Hypertension   . Hypothyroidism   . Pneumonia   . Thyroid disease     Past Surgical History:  Procedure Laterality Date  . ABLATION  2006  . BREAST BIOPSY Left 1998  . BREAST SURGERY     cyst aspiration  . BREAST SURGERY  1998   cyst aspiration  . COLONOSCOPY  03/2014  . ENDOMETRIAL ABLATION    . ESOPHAGOGASTRODUODENOSCOPY  (EGD) WITH PROPOFOL N/A 08/15/2015   Procedure: ESOPHAGOGASTRODUODENOSCOPY (EGD) WITH PROPOFOL;  Surgeon: Lollie Sails, MD;  [facility] ENDOSCOPY;  Service: Endoscopy;  Laterality: N/A;  . ESOPHAGOGASTRODUODENOSCOPY (EGD) WITH PROPOFOL N/A 01/22/2016   Procedure: ESOPHAGOGASTRODUODENOSCOPY (EGD) WITH PROPOFOL;  Surgeon: Lollie Sails, MD;  Location: [facility] ENDOSCOPY;  Service: Endoscopy;  Laterality: N/A;  . GASTRIC BYPASS  2005  . HERNIA REPAIR    . NASAL SINUS SURGERY    . TOTAL THYROIDECTOMY    . TUBAL LIGATION     Family History:  Family History  Problem Relation Age of Onset  . Heart disease Mother   . COPD Mother   . Arthritis Mother   . Cancer Mother     uterine  . Lupus Mother   . Anxiety disorder Mother   . Depression Mother   . Drug abuse Mother   . COPD Father   . Arthritis Father   . Heart disease Father   . Heart attack Father   . Alcohol abuse Father   . Heart disease Brother   . Heart attack Brother   . Drug abuse Brother   . Anxiety disorder Brother   . Depression Brother   . Heart disease Brother   . Diabetes Maternal Grandmother   . Diabetes Maternal Grandfather   . Diabetes Paternal Grandmother   . Diabetes Paternal Grandfather  Social History:  Social History   Social History  . Marital status: Married    Spouse name: N/A  . Number of children: N/A  . Years of education: N/A   Social History Main Topics  . Smoking status: Current Every Day Smoker    Packs/day: 1.00    Years: 30.00    Types: Cigarettes    Start date: 03/15/1980  . Smokeless tobacco: Never Used  . Alcohol use 6.0 - 7.2 oz/week    2 Standard drinks or equivalent, 8 - 10 Glasses of wine per week  . Drug use: No  . Sexual activity: Yes    Partners: Male   Other Topics Concern  . None   Social History Narrative   Lives in [place] with husband.      Work - [employer]      Diet - healthy diet   Exercise - limited   Caffeine use: daily   Additional  History:   Assessment:   Musculoskeletal: Strength & Muscle Tone: within normal limits Gait & Station: normal Patient leans: N/A  Psychiatric Specialty Exam: Medication Refill     Review of Systems  Musculoskeletal: Negative for joint pain.  Psychiatric/Behavioral: Negative for depression, hallucinations, memory loss, substance abuse and suicidal ideas. The patient is not nervous/anxious and does not have insomnia.   All other systems reviewed and are negative.   Blood pressure 137/84, pulse 92, temperature 97.4 F (36.3 C), temperature source Oral, weight 227 lb (103 kg).Body mass index is 38.96 kg/m.  General Appearance: Well Groomed  Eye Contact:  Good  Speech:  Normal Rate  Volume:  Normal  Mood:  Anxious and Depressed  Affect:  Depressed and Tearful  Thought Process:  Linear  Orientation:  Full (Time, Place, and Person)  Thought Content:  WDL  Suicidal Thoughts:  No  Homicidal Thoughts:  No  Memory:  Immediate;   Good Recent;   Good Remote;   Good  Judgement:  Good  Insight:  Good  Psychomotor Activity:  Normal  Concentration:  Good  Recall:  Good  Fund of Knowledge: Good  Language: Good  Akathisia:  Negative  Handed:   AIMS (if indicated):  Done 05/16/15 and normal  Assets:  Communication Skills Desire for Improvement Social Support Talents/Skills  ADL's:  Intact  Cognition: WNL  Sleep:  good   Is the patient at risk to self?  No. Has the patient been a risk to self in the past 6 months?  No. Has the patient been a risk to self within the distant past?  No. Is the patient a risk to others?  No. Has the patient been a risk to others in the past 6 months?  No. Has the patient been a risk to others within the distant past?  No.  Current Medications: Current Outpatient Prescriptions  Medication Sig Dispense Refill  . budesonide-formoterol (SYMBICORT) 80-4.5 MCG/ACT inhaler Inhale 2 puffs into the lungs 2 (two) times daily. 1 Inhaler 3  . cetirizine  (ZYRTEC) 10 MG tablet Take by mouth.    . Cholecalciferol (VITAMIN D) 2000 units CAPS Take by mouth.    . clonazePAM (KLONOPIN) 0.5 MG tablet Take 1 tablet (0.5 mg total) by mouth daily. 30 tablet 1  . gabapentin (NEURONTIN) 300 MG capsule Take 1 capsule (300 mg total) by mouth at bedtime. 30 capsule 1  . lisinopril (PRINIVIL,ZESTRIL) 20 MG tablet Take 1 tablet (20 mg total) by mouth daily. 90 tablet 2  . montelukast (SINGULAIR) 10  MG tablet Take 1 tablet (10 mg total) by mouth at bedtime. 30 tablet 3  . pantoprazole (PROTONIX) 40 MG tablet TAKE ONE TABLET BY MOUTH ONCE DAILY 90 tablet 3  . phentermine 30 MG capsule Take 1 capsule (30 mg total) by mouth every morning. 30 capsule 1  . SYNTHROID 125 MCG tablet TAKE TWO TABLETS BY MOUTH ONCE DAILY 180 tablet 3  . topiramate (TOPAMAX) 100 MG tablet Take 1 tablet (100 mg total) by mouth daily. 30 tablet 1  . venlafaxine XR (EFFEXOR-XR) 150 MG 24 hr capsule Take 1 capsule (150 mg total) by mouth daily with breakfast. 30 capsule 1  . sucralfate (CARAFATE) 1 g tablet Take by mouth.     No current facility-administered medications for this visit.     Medical Decision Making:  Established Problem, Stable/Improving (1), Review of Medication Regimen & Side Effects (2) and Review of New Medication or Change in Dosage (2)  Treatment Plan Summary:Medication management and Plan   Patient has been on Effexor XR 150 mg.  Generalized anxiety disorder-patient continue her Klonopin 0.5 qhs  She will also start taking gabapentin 300 mg by mouth daily at bedtime to help with her anxiety symptoms. Continue  Topamax 100 mg by mouth daily at bedtime to help with the weight loss. She agreed with the plan. Continue  phentermine 30 mg daily. Advised her about the side effects and she agreed with the plan. Advised patient to start seeing a therapist on a regular basis and she agreed with the plan.  Follow-up in  2 month or earlier depending on her symptoms.    More  than 50% of the time spent in psychoeducation, counseling and coordination of care.    This note was generated in part or whole with voice recognition software. Voice regonition is usually quite accurate but there are transcription errors that can and very often do occur. I apologize for any typographical errors that were not detected and corrected.   Rainey Pines, MD  09/04/2016, 11:21 AM

## 2016-09-12 ENCOUNTER — Encounter: Payer: Self-pay | Admitting: Physician Assistant

## 2016-09-12 ENCOUNTER — Ambulatory Visit: Payer: Self-pay | Admitting: Physician Assistant

## 2016-09-12 VITALS — BP 143/76 | HR 79 | Temp 97.6°F

## 2016-09-12 DIAGNOSIS — R059 Cough, unspecified: Secondary | ICD-10-CM

## 2016-09-12 DIAGNOSIS — R05 Cough: Secondary | ICD-10-CM

## 2016-09-12 DIAGNOSIS — J209 Acute bronchitis, unspecified: Secondary | ICD-10-CM

## 2016-09-12 MED ORDER — METHYLPREDNISOLONE 4 MG PO TBPK: ORAL_TABLET | ORAL | 0 refills | Status: DC

## 2016-09-12 MED ORDER — DEXAMETHASONE SODIUM PHOSPHATE 10 MG/ML IJ SOLN
10.0000 mg | Freq: Once | INTRAMUSCULAR | Status: AC
  Administered 2016-09-12: 10 mg via INTRAMUSCULAR

## 2016-09-12 MED ORDER — IPRATROPIUM-ALBUTEROL 0.5-2.5 (3) MG/3ML IN SOLN
3.0000 mL | Freq: Once | RESPIRATORY_TRACT | Status: AC
  Administered 2016-09-12: 3 mL via RESPIRATORY_TRACT

## 2016-09-12 MED ORDER — LEVOFLOXACIN 500 MG PO TABS: 500.0000 mg | ORAL_TABLET | Freq: Every day | ORAL | 0 refills | Status: DC

## 2016-09-12 MED ORDER — HYDROCOD POLST-CPM POLST ER 10-8 MG/5ML PO SUER: 5.0000 mL | Freq: Two times a day (BID) | ORAL | 0 refills | Status: DC | PRN

## 2016-09-12 NOTE — Progress Notes (Signed)
S: C/o cough and congestion, chest feels tight;  chest is sore from coughing, denies fever, chills, or body aches;  mucus is yellow but mostly cough is dry and hacking; keeping pt awake at night;  denies cardiac type chest pain or sob, v/d, abd pain Remainder ros neg  O: vitals wnl, nad, tms clear, throat injected, neck supple no lymph, lungs with wheezing, cv rrr, neuro intact, duoneb given, decreased coughing, increased air movement, pt tolerated svn well  A:  Acute bronchitis   P:  rx medication:  Medrol dose pack, tussionex 149ml nr, ; levaquin 500mg  qd x 7d;  smoking cessation, use otc meds, tylenol or motrin as needed for fever/chills, return if not better in 3 -5 days, return earlier if worsening

## 2016-09-12 NOTE — Patient Instructions (Addendum)
Acute Bronchitis, Adult Acute bronchitis is when air tubes (bronchi) in the lungs suddenly get swollen. The condition can make it hard to breathe. It can also cause these symptoms:  A cough.  Coughing up clear, yellow, or green mucus.  Wheezing.  Chest congestion.  Shortness of breath.  A fever.  Body aches.  Chills.  A sore throat. Follow these instructions at home: Medicines   Take over-the-counter and prescription medicines only as told by your doctor.  If you were prescribed an antibiotic medicine, take it as told by your doctor. Do not stop taking the antibiotic even if you start to feel better. General instructions   Rest.  Drink enough fluids to keep your pee (urine) clear or pale yellow.  Avoid smoking and secondhand smoke. If you smoke and you need help quitting, ask your doctor. Quitting will help your lungs heal faster.  Use an inhaler, cool mist vaporizer, or humidifier as told by your doctor.  Keep all follow-up visits as told by your doctor. This is important. How is this prevented? To lower your risk of getting this condition again:  Wash your hands often with soap and water. If you cannot use soap and water, use hand sanitizer.  Avoid contact with people who have cold symptoms.  Try not to touch your hands to your mouth, nose, or eyes.  Make sure to get the flu shot every year. Contact a doctor if:  Your symptoms do not get better in 2 weeks. Get help right away if:  You cough up blood.  You have chest pain.  You have very bad shortness of breath.  You become dehydrated.  You faint (pass out) or keep feeling like you are going to pass out.  You keep throwing up (vomiting).  You have a very bad headache.  Your fever or chills gets worse. This information is not intended to replace advice given to you by your health care provider. Make sure you discuss any questions you have with your health care provider. Document Released: 11/20/2007  Document Revised: 01/10/2016 Document Reviewed: 11/22/2015 Elsevier Interactive Patient Education  2017 [employer].  Steps to Quit Smoking Smoking tobacco can be bad for your health. It can also affect almost every organ in your body. Smoking puts you and people around you at risk for many serious long-lasting (chronic) diseases. Quitting smoking is hard, but it is one of the best things that you can do for your health. It is never too late to quit. What are the benefits of quitting smoking? When you quit smoking, you lower your risk for getting serious diseases and conditions. They can include:  Lung cancer or lung disease.  Heart disease.  Stroke.  Heart attack.  Not being able to have children (infertility).  Weak bones (osteoporosis) and broken bones (fractures). If you have coughing, wheezing, and shortness of breath, those symptoms may get better when you quit. You may also get sick less often. If you are pregnant, quitting smoking can help to lower your chances of having a baby of low birth weight. What can I do to help me quit smoking? Talk with your doctor about what can help you quit smoking. Some things you can do (strategies) include:  Quitting smoking totally, instead of slowly cutting back how much you smoke over a period of time.  Going to in-person counseling. You are more likely to quit if you go to many counseling sessions.  Using resources and support systems, such as:  Online  chats with a Social worker.  Phone quitlines.  Printed Furniture conservator/restorer.  Support groups or group counseling.  Text messaging programs.  Mobile phone apps or applications.  Taking medicines. Some of these medicines may have nicotine in them. If you are pregnant or breastfeeding, do not take any medicines to quit smoking unless your doctor says it is okay. Talk with your doctor about counseling or other things that can help you. Talk with your doctor about using more than one  strategy at the same time, such as taking medicines while you are also going to in-person counseling. This can help make quitting easier. What things can I do to make it easier to quit? Quitting smoking might feel very hard at first, but there is a lot that you can do to make it easier. Take these steps:  Talk to your family and friends. Ask them to support and encourage you.  Call phone quitlines, reach out to support groups, or work with a Social worker.  Ask people who smoke to not smoke around you.  Avoid places that make you want (trigger) to smoke, such as:  Bars.  Parties.  Smoke-break areas at work.  Spend time with people who do not smoke.  Lower the stress in your life. Stress can make you want to smoke. Try these things to help your stress:  Getting regular exercise.  Deep-breathing exercises.  Yoga.  Meditating.  Doing a body scan. To do this, close your eyes, focus on one area of your body at a time from head to toe, and notice which parts of your body are tense. Try to relax the muscles in those areas.  Download or buy apps on your mobile phone or tablet that can help you stick to your quit plan. There are many free apps, such as QuitGuide from the [employer] Office manager for Disease Control and Prevention). You can find more support from smokefree.gov and other websites. This information is not intended to replace advice given to you by your health care provider. Make sure you discuss any questions you have with your health care provider. Document Released: 03/30/2009 Document Revised: 01/30/2016 Document Reviewed: 10/18/2014 Elsevier Interactive Patient Education  2017 [employer].

## 2016-09-20 ENCOUNTER — Telehealth: Payer: Self-pay | Admitting: Family Medicine

## 2016-09-20 NOTE — Telephone Encounter (Signed)
Pt called is coming for an appt on 4/19 and would like to have a script for her lab work prior, she wants to have them done at work. States that it is over by the [facility] building. Please advise, thank you!  Call pt @ [phone]

## 2016-09-23 ENCOUNTER — Other Ambulatory Visit: Payer: Self-pay | Admitting: Family Medicine

## 2016-09-23 DIAGNOSIS — Z0000 Encounter for general adult medical examination without abnormal findings: Secondary | ICD-10-CM

## 2016-09-25 ENCOUNTER — Other Ambulatory Visit: Payer: Self-pay | Admitting: Family Medicine

## 2016-09-25 DIAGNOSIS — Z0000 Encounter for general adult medical examination without abnormal findings: Secondary | ICD-10-CM

## 2016-09-25 NOTE — Telephone Encounter (Signed)
Orders are in

## 2016-09-25 NOTE — Telephone Encounter (Signed)
A voicemail was left letting pt know labs were in.

## 2016-09-27 NOTE — Telephone Encounter (Signed)
Pt called back and wanted to know sure that labs were sent to the [facility] building for [employer]. Please advise, thank you!  Call pt @ [phone]

## 2016-09-27 NOTE — Telephone Encounter (Signed)
Disregard previous message 

## 2016-10-01 ENCOUNTER — Other Ambulatory Visit: Payer: Self-pay | Admitting: Psychiatry

## 2016-10-02 ENCOUNTER — Other Ambulatory Visit (INDEPENDENT_AMBULATORY_CARE_PROVIDER_SITE_OTHER): Payer: Managed Care, Other (non HMO)

## 2016-10-02 ENCOUNTER — Other Ambulatory Visit: Payer: Self-pay | Admitting: Family Medicine

## 2016-10-02 DIAGNOSIS — Z0000 Encounter for general adult medical examination without abnormal findings: Secondary | ICD-10-CM | POA: Diagnosis not present

## 2016-10-02 LAB — COMPREHENSIVE METABOLIC PANEL
ALBUMIN: 3.2 g/dL — AB (ref 3.5–5.2)
ALT: 25 U/L (ref 0–35)
AST: 19 U/L (ref 0–37)
Alkaline Phosphatase: 54 U/L (ref 39–117)
BUN: 12 mg/dL (ref 6–23)
CHLORIDE: 106 meq/L (ref 96–112)
CO2: 26 meq/L (ref 19–32)
CREATININE: 0.66 mg/dL (ref 0.40–1.20)
Calcium: 9.7 mg/dL (ref 8.4–10.5)
GFR: 99.55 mL/min (ref >60.00)
GLUCOSE: 97 mg/dL (ref 70–99)
Potassium: 4 mEq/L (ref 3.5–5.1)
SODIUM: 138 meq/L (ref 135–145)
Total Bilirubin: 0.4 mg/dL (ref 0.2–1.2)
Total Protein: 7.7 g/dL (ref 6.0–8.3)

## 2016-10-02 LAB — CBC
HCT: 40.5 % (ref 36.0–46.0)
Hemoglobin: 13.5 g/dL (ref 12.0–15.0)
MCHC: 33.3 g/dL (ref 30.0–36.0)
MCV: 90.8 fl (ref 78.0–100.0)
Platelets: 256 10*3/uL (ref 150.0–400.0)
RBC: 4.46 Mil/uL (ref 3.87–5.11)
RDW: 15.5 % (ref 11.5–15.5)
WBC: 6.2 10*3/uL (ref 4.0–10.5)

## 2016-10-02 LAB — LIPID PANEL
CHOL/HDL RATIO: 2
CHOLESTEROL: 213 mg/dL — AB (ref 0–200)
HDL: 86.1 mg/dL (ref >39.00)
LDL Cholesterol: 109 mg/dL — ABNORMAL HIGH (ref 0–99)
NONHDL: 126.8
Triglycerides: 89 mg/dL (ref 0.0–149.0)
VLDL: 17.8 mg/dL (ref 0.0–40.0)

## 2016-10-02 LAB — HEMOGLOBIN A1C: HEMOGLOBIN A1C: 5.9 % (ref 4.6–6.5)

## 2016-10-02 LAB — TSH: TSH: 0.01 u[IU]/mL — ABNORMAL LOW (ref 0.35–4.50)

## 2016-10-02 MED ORDER — LEVOTHYROXINE SODIUM 100 MCG PO TABS: 100.0000 ug | ORAL_TABLET | Freq: Every day | ORAL | 0 refills | Status: DC

## 2016-10-03 ENCOUNTER — Ambulatory Visit (INDEPENDENT_AMBULATORY_CARE_PROVIDER_SITE_OTHER): Payer: Managed Care, Other (non HMO) | Admitting: Family Medicine

## 2016-10-03 ENCOUNTER — Encounter: Payer: Self-pay | Admitting: Family Medicine

## 2016-10-03 VITALS — BP 136/88 | HR 83 | Temp 97.5°F | Ht 63.0 in | Wt 220.5 lb

## 2016-10-03 DIAGNOSIS — Z0000 Encounter for general adult medical examination without abnormal findings: Secondary | ICD-10-CM | POA: Diagnosis not present

## 2016-10-03 DIAGNOSIS — Z1239 Encounter for other screening for malignant neoplasm of breast: Secondary | ICD-10-CM

## 2016-10-03 DIAGNOSIS — Z1231 Encounter for screening mammogram for malignant neoplasm of breast: Secondary | ICD-10-CM | POA: Diagnosis not present

## 2016-10-03 MED ORDER — LEVOTHYROXINE SODIUM 200 MCG PO TABS: 200.0000 ug | ORAL_TABLET | Freq: Every day | ORAL | 0 refills | Status: DC

## 2016-10-03 NOTE — Patient Instructions (Signed)
Labs in 6 weeks.  Take care  Dr. Lacinda Axon  Health Maintenance, Female Adopting a healthy lifestyle and getting preventive care can go a long way to promote health and wellness. Talk with your health care provider about what schedule of regular examinations is right for you. This is a good chance for you to check in with your provider about disease prevention and staying healthy. In between checkups, there are plenty of things you can do on your own. Experts have done a lot of research about which lifestyle changes and preventive measures are most likely to keep you healthy. Ask your health care provider for more information. Weight and diet Eat a healthy diet  Be sure to include plenty of vegetables, fruits, low-fat dairy products, and lean protein.  Do not eat a lot of foods high in solid fats, added sugars, or salt.  Get regular exercise. This is one of the most important things you can do for your health.  Most adults should exercise for at least 150 minutes each week. The exercise should increase your heart rate and make you sweat (moderate-intensity exercise).  Most adults should also do strengthening exercises at least twice a week. This is in addition to the moderate-intensity exercise. Maintain a healthy weight  Body mass index (BMI) is a measurement that can be used to identify possible weight problems. It estimates body fat based on height and weight. Your health care provider can help determine your BMI and help you achieve or maintain a healthy weight.  For females 101 years of age and older:  A BMI below 18.5 is considered underweight.  A BMI of 18.5 to 24.9 is normal.  A BMI of 25 to 29.9 is considered overweight.  A BMI of 30 and above is considered obese. Watch levels of cholesterol and blood lipids  You should start having your blood tested for lipids and cholesterol at 53 years of age, then have this test every 5 years.  You may need to have your cholesterol levels  checked more often if:  Your lipid or cholesterol levels are high.  You are older than 53 years of age.  You are at high risk for heart disease. Cancer screening Lung Cancer  Lung cancer screening is recommended for adults 56-39 years old who are at high risk for lung cancer because of a history of smoking.  A yearly low-dose CT scan of the lungs is recommended for people who:  Currently smoke.  Have quit within the past 15 years.  Have at least a 30-pack-year history of smoking. A pack year is smoking an average of one pack of cigarettes a day for 1 year.  Yearly screening should continue until it has been 15 years since you quit.  Yearly screening should stop if you develop a health problem that would prevent you from having lung cancer treatment. Breast Cancer  Practice breast self-awareness. This means understanding how your breasts normally appear and feel.  It also means doing regular breast self-exams. Let your health care provider know about any changes, no matter how small.  If you are in your 20s or 30s, you should have a clinical breast exam (CBE) by a health care provider every 1-3 years as part of a regular health exam.  If you are 26 or older, have a CBE every year. Also consider having a breast X-ray (mammogram) every year.  If you have a family history of breast cancer, talk to your health care provider about genetic screening.  If you are at high risk for breast cancer, talk to your health care provider about having an MRI and a mammogram every year.  Breast cancer gene (BRCA) assessment is recommended for women who have family members with BRCA-related cancers. BRCA-related cancers include:  Breast.  Ovarian.  Tubal.  Peritoneal cancers.  Results of the assessment will determine the need for genetic counseling and BRCA1 and BRCA2 testing. Cervical Cancer  Your health care provider may recommend that you be screened regularly for cancer of the pelvic  organs (ovaries, uterus, and vagina). This screening involves a pelvic examination, including checking for microscopic changes to the surface of your cervix (Pap test). You may be encouraged to have this screening done every 3 years, beginning at age 75.  For women ages 49-65, health care providers may recommend pelvic exams and Pap testing every 3 years, or they may recommend the Pap and pelvic exam, combined with testing for human papilloma virus (HPV), every 5 years. Some types of HPV increase your risk of cervical cancer. Testing for HPV may also be done on women of any age with unclear Pap test results.  Other health care providers may not recommend any screening for nonpregnant women who are considered low risk for pelvic cancer and who do not have symptoms. Ask your health care provider if a screening pelvic exam is right for you.  If you have had past treatment for cervical cancer or a condition that could lead to cancer, you need Pap tests and screening for cancer for at least 20 years after your treatment. If Pap tests have been discontinued, your risk factors (such as having a new sexual partner) need to be reassessed to determine if screening should resume. Some women have medical problems that increase the chance of getting cervical cancer. In these cases, your health care provider may recommend more frequent screening and Pap tests. Colorectal Cancer  This type of cancer can be detected and often prevented.  Routine colorectal cancer screening usually begins at 53 years of age and continues through 53 years of age.  Your health care provider may recommend screening at an earlier age if you have risk factors for colon cancer.  Your health care provider may also recommend using home test kits to check for hidden blood in the stool.  A small camera at the end of a tube can be used to examine your colon directly (sigmoidoscopy or colonoscopy). This is done to check for the earliest forms  of colorectal cancer.  Routine screening usually begins at age 69.  Direct examination of the colon should be repeated every 5-10 years through 53 years of age. However, you may need to be screened more often if early forms of precancerous polyps or small growths are found. Skin Cancer  Check your skin from head to toe regularly.  Tell your health care provider about any new moles or changes in moles, especially if there is a change in a mole's shape or color.  Also tell your health care provider if you have a mole that is larger than the size of a pencil eraser.  Always use sunscreen. Apply sunscreen liberally and repeatedly throughout the day.  Protect yourself by wearing long sleeves, pants, a wide-brimmed hat, and sunglasses whenever you are outside. Heart disease, diabetes, and high blood pressure  High blood pressure causes heart disease and increases the risk of stroke. High blood pressure is more likely to develop in:  People who have blood pressure in the  high end of the normal range (130-139/85-89 mm Hg).  People who are overweight or obese.  People who are African American.  If you are 29-58 years of age, have your blood pressure checked every 3-5 years. If you are 1 years of age or older, have your blood pressure checked every year. You should have your blood pressure measured twice-once when you are at a hospital or clinic, and once when you are not at a hospital or clinic. Record the average of the two measurements. To check your blood pressure when you are not at a hospital or clinic, you can use:  An automated blood pressure machine at a pharmacy.  A home blood pressure monitor.  If you are between 46 years and 51 years old, ask your health care provider if you should take aspirin to prevent strokes.  Have regular diabetes screenings. This involves taking a blood sample to check your fasting blood sugar level.  If you are at a normal weight and have a low risk for  diabetes, have this test once every three years after 53 years of age.  If you are overweight and have a high risk for diabetes, consider being tested at a younger age or more often. Preventing infection Hepatitis B  If you have a higher risk for hepatitis B, you should be screened for this virus. You are considered at high risk for hepatitis B if:  You were born in a country where hepatitis B is common. Ask your health care provider which countries are considered high risk.  Your parents were born in a high-risk country, and you have not been immunized against hepatitis B (hepatitis B vaccine).  You have HIV or AIDS.  You use needles to inject street drugs.  You live with someone who has hepatitis B.  You have had sex with someone who has hepatitis B.  You get hemodialysis treatment.  You take certain medicines for conditions, including cancer, organ transplantation, and autoimmune conditions. Hepatitis C  Blood testing is recommended for:  Everyone born from [birth year] through 1967.  Anyone with known risk factors for hepatitis C. Sexually transmitted infections (STIs)  You should be screened for sexually transmitted infections (STIs) including gonorrhea and chlamydia if:  You are sexually active and are younger than 53 years of age.  You are older than 53 years of age and your health care provider tells you that you are at risk for this type of infection.  Your sexual activity has changed since you were last screened and you are at an increased risk for chlamydia or gonorrhea. Ask your health care provider if you are at risk.  If you do not have HIV, but are at risk, it may be recommended that you take a prescription medicine daily to prevent HIV infection. This is called pre-exposure prophylaxis (PrEP). You are considered at risk if:  You are sexually active and do not regularly use condoms or know the HIV status of your partner(s).  You take drugs by injection.  You are  sexually active with a partner who has HIV. Talk with your health care provider about whether you are at high risk of being infected with HIV. If you choose to begin PrEP, you should first be tested for HIV. You should then be tested every 3 months for as long as you are taking PrEP. Pregnancy  If you are premenopausal and you may become pregnant, ask your health care provider about preconception counseling.  If you may become  pregnant, take 400 to 800 micrograms (mcg) of folic acid every day.  If you want to prevent pregnancy, talk to your health care provider about birth control (contraception). Osteoporosis and menopause  Osteoporosis is a disease in which the bones lose minerals and strength with aging. This can result in serious bone fractures. Your risk for osteoporosis can be identified using a bone density scan.  If you are 7 years of age or older, or if you are at risk for osteoporosis and fractures, ask your health care provider if you should be screened.  Ask your health care provider whether you should take a calcium or vitamin D supplement to lower your risk for osteoporosis.  Menopause may have certain physical symptoms and risks.  Hormone replacement therapy may reduce some of these symptoms and risks. Talk to your health care provider about whether hormone replacement therapy is right for you. Follow these instructions at home:  Schedule regular health, dental, and eye exams.  Stay current with your immunizations.  Do not use any tobacco products including cigarettes, chewing tobacco, or electronic cigarettes.  If you are pregnant, do not drink alcohol.  If you are breastfeeding, limit how much and how often you drink alcohol.  Limit alcohol intake to no more than 1 drink per day for nonpregnant women. One drink equals 12 ounces of beer, 5 ounces of wine, or 1 ounces of hard liquor.  Do not use street drugs.  Do not share needles.  Ask your health care  provider for help if you need support or information about quitting drugs.  Tell your health care provider if you often feel depressed.  Tell your health care provider if you have ever been abused or do not feel safe at home. This information is not intended to replace advice given to you by your health care provider. Make sure you discuss any questions you have with your health care provider. Document Released: 12/17/2010 Document Revised: 11/09/2015 Document Reviewed: 03/07/2015 Elsevier Interactive Patient Education  2017 [employer].

## 2016-10-03 NOTE — Assessment & Plan Note (Signed)
Just had labs. Mammogram ordered. Will wait on Shingles vaccine until we have it here. Declines HIV and Hep C screening. Remainder of preventative health care up to date.

## 2016-10-03 NOTE — Progress Notes (Signed)
Pre visit review using our clinic review tool, if applicable. No additional management support is needed unless otherwise documented below in the visit note. 

## 2016-10-03 NOTE — Progress Notes (Signed)
Subjective:  Patient ID: Paula Garrett, female    DOB: 06-Nov-1965  Age: 51 y.o. MRN: [id number]  CC: Physical   HPI Paula Garrett is a 51 y.o. female presents to the clinic today for an annual physical exam.  Preventative Healthcare  Pap smear: Up to date.   Mammogram: In need of this year.   Colonoscopy: Up to date.   Immunizations  Tetanus - Up to date.  Pneumococcal - Up to date.   Flu - Up to date.   Zoster - Candidate for.   Hepatitis C screening - Declines.   Labs: Has had recent labs.   Alcohol use: See below.   Smoking/tobacco use: Current smoker.   STD/HIV testing: Declines.   PMH, Surgical Hx, Family Hx, Social History reviewed and updated as below.  Past Medical History:  Diagnosis Date  . Anxiety   . Arthritis   . Asthma   . Depression   . GERD (gastroesophageal reflux disease)   . Hypertension   . Hypothyroidism   . Pneumonia   . Thyroid disease    Past Surgical History:  Procedure Laterality Date  . ABLATION  2006  . BREAST BIOPSY Left 1998  . BREAST SURGERY     cyst aspiration  . BREAST SURGERY  1998   cyst aspiration  . COLONOSCOPY  03/2014  . ENDOMETRIAL ABLATION    . ESOPHAGOGASTRODUODENOSCOPY (EGD) WITH PROPOFOL N/A 08/15/2015   Procedure: ESOPHAGOGASTRODUODENOSCOPY (EGD) WITH PROPOFOL;  Surgeon: Lollie Sails, MD;  Location: [facility] ENDOSCOPY;  Service: Endoscopy;  Laterality: N/A;  . ESOPHAGOGASTRODUODENOSCOPY (EGD) WITH PROPOFOL N/A 01/22/2016   Procedure: ESOPHAGOGASTRODUODENOSCOPY (EGD) WITH PROPOFOL;  Surgeon: Lollie Sails, MD;  Location: [facility] ENDOSCOPY;  Service: Endoscopy;  Laterality: N/A;  . GASTRIC BYPASS  2005  . HERNIA REPAIR    . NASAL SINUS SURGERY    . TOTAL THYROIDECTOMY    . TUBAL LIGATION     Family History  Problem Relation Age of Onset  . Heart disease Mother   . COPD Mother   . Arthritis Mother   . Cancer Mother     uterine  . Lupus Mother   . Anxiety disorder Mother   . Depression Mother     . Drug abuse Mother   . COPD Father   . Arthritis Father   . Heart disease Father   . Heart attack Father   . Alcohol abuse Father   . Heart disease Brother   . Heart attack Brother   . Drug abuse Brother   . Anxiety disorder Brother   . Depression Brother   . Heart disease Brother   . Diabetes Maternal Grandmother   . Diabetes Maternal Grandfather   . Diabetes Paternal Grandmother   . Diabetes Paternal Grandfather    Social History  Substance Use Topics  . Smoking status: Current Every Day Smoker    Packs/day: 1.00    Years: 30.00    Types: Cigarettes    Start date: 03/15/1980  . Smokeless tobacco: Never Used  . Alcohol use 6.0 - 7.2 oz/week    2 Standard drinks or equivalent, 8 - 10 Glasses of wine per week   Review of Systems  Respiratory: Positive for cough and shortness of breath.   All other systems reviewed and are negative.  Objective:   Today's Vitals: BP 136/88   Pulse 83   Temp 97.5 F (36.4 C) (Oral)   Ht 5\' 3"  (1.6 m)   Wt 220 lb 8  oz (100 kg)   SpO2 97%   BMI 39.06 kg/m   Physical Exam  Constitutional: She is oriented to person, place, and time. She appears well-developed. No distress.  HENT:  Mouth/Throat: Oropharynx is clear and moist.  Eyes: Conjunctivae are normal.  Neck: Neck supple.  Cardiovascular: Normal rate and regular rhythm.   Pulmonary/Chest: Effort normal and breath sounds normal.  Abdominal: Soft. She exhibits no distension. There is no tenderness. There is no rebound and no guarding.  Musculoskeletal: Normal range of motion.  Neurological: She is alert and oriented to person, place, and time.  Skin: No rash noted.  Psychiatric: She has a normal mood and affect.  Vitals reviewed.  Assessment & Plan:   Problem List Items Addressed This Visit    Annual physical exam - Primary    Just had labs. Mammogram ordered. Will wait on Shingles vaccine until we have it here. Declines HIV and Hep C screening. Remainder of preventative  health care up to date.       Other Visit Diagnoses    Breast cancer screening       Relevant Orders   MM Digital Screening      Meds ordered this encounter  Medications  . levothyroxine (SYNTHROID, LEVOTHROID) 200 MCG tablet    Sig: Take 1 tablet (200 mcg total) by mouth daily before breakfast.    Dispense:  90 tablet    Refill:  0   Follow-up: 6 weeks for labs  Thersa Salt DO [facility]

## 2016-10-05 ENCOUNTER — Other Ambulatory Visit: Payer: Self-pay | Admitting: Physician Assistant

## 2016-10-07 ENCOUNTER — Other Ambulatory Visit: Payer: Self-pay | Admitting: Family Medicine

## 2016-10-07 MED ORDER — PANTOPRAZOLE SODIUM 40 MG PO TBEC: 40.0000 mg | DELAYED_RELEASE_TABLET | Freq: Every day | ORAL | 3 refills | Status: DC

## 2016-10-07 NOTE — Telephone Encounter (Signed)
Med refill for singulair approved 

## 2016-10-29 ENCOUNTER — Encounter: Payer: Self-pay | Admitting: Family Medicine

## 2016-11-13 ENCOUNTER — Ambulatory Visit
Admission: RE | Admit: 2016-11-13 | Discharge: 2016-11-13 | Disposition: A | Discharge status: Home / Self Care | Payer: Managed Care, Other (non HMO) | Source: Ambulatory Visit | Attending: Family Medicine | Admitting: Family Medicine

## 2016-11-13 DIAGNOSIS — Z1231 Encounter for screening mammogram for malignant neoplasm of breast: Secondary | ICD-10-CM | POA: Insufficient documentation

## 2017-01-09 ENCOUNTER — Other Ambulatory Visit: Payer: Self-pay | Admitting: Family Medicine

## 2017-04-16 ENCOUNTER — Other Ambulatory Visit: Payer: Self-pay | Admitting: Family Medicine

## 2017-05-02 ENCOUNTER — Ambulatory Visit: Payer: Self-pay | Admitting: Physician Assistant

## 2017-05-02 ENCOUNTER — Encounter: Payer: Self-pay | Admitting: Physician Assistant

## 2017-05-02 VITALS — BP 120/80 | HR 87 | Temp 97.7°F | Resp 16

## 2017-05-02 DIAGNOSIS — J069 Acute upper respiratory infection, unspecified: Secondary | ICD-10-CM

## 2017-05-02 LAB — POCT INFLUENZA A/B
INFLUENZA A, POC: NEGATIVE
INFLUENZA B, POC: NEGATIVE

## 2017-05-02 MED ORDER — METHYLPREDNISOLONE 4 MG PO TBPK: ORAL_TABLET | ORAL | 0 refills | Status: DC

## 2017-05-02 MED ORDER — CEFDINIR 300 MG PO CAPS: 300.0000 mg | ORAL_CAPSULE | Freq: Two times a day (BID) | ORAL | 0 refills | Status: DC

## 2017-05-02 NOTE — Progress Notes (Signed)
S: C/o runny nose and congestion with dry cough for 3 days, + fever, chills, and body aches;  denies cp/sob, v/d; mucus was green this am but clear throughout the day, cough is sporadic, is worried she will get bronchitis, did not go to work yesterday  Using otc meds: robitussin  O: PE: vitals wnl, nad,  perrl eomi, normocephalic, tms dull, nasal mucosa red and swollen, throat injected, neck supple no lymph, lungs with scattered wheezing in the lower lung fields bilaterally, cv rrr, neuro intact, flu swab neg  A:  Acute uri  P: drink fluids, continue regular meds , use otc meds of choice, return if not improving in 5 days, return earlier if worsening . Omnicef, medrol dose pack, work note

## 2017-05-05 ENCOUNTER — Ambulatory Visit: Payer: Self-pay | Admitting: Internal Medicine

## 2017-05-15 ENCOUNTER — Other Ambulatory Visit: Payer: Self-pay | Admitting: Family Medicine

## 2017-05-19 ENCOUNTER — Other Ambulatory Visit: Payer: Self-pay

## 2017-05-19 ENCOUNTER — Ambulatory Visit: Payer: 59 | Admitting: Psychiatry

## 2017-05-19 ENCOUNTER — Encounter: Payer: Self-pay | Admitting: Psychiatry

## 2017-05-19 VITALS — BP 154/83 | HR 99 | Temp 97.9°F | Wt 209.4 lb

## 2017-05-19 DIAGNOSIS — F331 Major depressive disorder, recurrent, moderate: Secondary | ICD-10-CM | POA: Diagnosis not present

## 2017-05-19 DIAGNOSIS — F5081 Binge eating disorder: Secondary | ICD-10-CM

## 2017-05-19 MED ORDER — TOPIRAMATE 50 MG PO TABS: 50.0000 mg | ORAL_TABLET | Freq: Two times a day (BID) | ORAL | 1 refills | Status: DC

## 2017-05-19 MED ORDER — TOPIRAMATE 50 MG PO TABS: 50.0000 mg | ORAL_TABLET | Freq: Every day | ORAL | 1 refills | Status: DC

## 2017-05-19 MED ORDER — TRAZODONE HCL 100 MG PO TABS: 100.0000 mg | ORAL_TABLET | Freq: Every day | ORAL | 1 refills | Status: DC

## 2017-05-19 MED ORDER — DULOXETINE HCL 30 MG PO CPEP: 30.0000 mg | ORAL_CAPSULE | Freq: Every day | ORAL | 1 refills | Status: DC

## 2017-05-19 NOTE — Progress Notes (Signed)
BH MD/PA/NP OP Progress Note  05/19/2017 3:45 PM Paula Garrett  MRN:  [id number]  Subjective:  Patient is a 51 year old female who presented for the follow-up appointment.She was last since in March.  She reported that she has stopped taking her medications in June. She was sedentary poorly the interview. Patient reported that she has lost several pounds since then. She reported that she has been feeling depressed and anxious and wants to restart her medications. She was anxious about her weight gain. We discussed about her medications and she reported that she wants to try something else. She is interested in trying the Topamax again as it has helped with her weight loss. She reported that she is not sleeping well at night.    Patient reported that she has poor relationship with her family. She denied having any suicidal ideations or plans. She does not use any drugs or alcohol at this time.   .   Chief Complaint    Follow-up; Medication Refill; Anxiety; Medication Problem     Visit Diagnosis:     ICD-10-CM   1. MDD (major depressive disorder), recurrent episode, moderate (HCC) F33.1   2. Binge eating disorder F50.81     Past Medical History:  Past Medical History:  Diagnosis Date  . Anxiety   . Arthritis   . Asthma   . Depression   . GERD (gastroesophageal reflux disease)   . Hypertension   . Hypothyroidism   . Pneumonia   . Thyroid disease     Past Surgical History:  Procedure Laterality Date  . ABLATION  2006  . BREAST BIOPSY Left 1998  . BREAST SURGERY     cyst aspiration  . BREAST SURGERY  1998   cyst aspiration  . COLONOSCOPY  03/2014  . ENDOMETRIAL ABLATION    . ESOPHAGOGASTRODUODENOSCOPY (EGD) WITH PROPOFOL N/A 08/15/2015   Procedure: ESOPHAGOGASTRODUODENOSCOPY (EGD) WITH PROPOFOL;  Surgeon: Lollie Sails, MD;  Location: [facility] ENDOSCOPY;  Service: Endoscopy;  Laterality: N/A;  . ESOPHAGOGASTRODUODENOSCOPY (EGD) WITH PROPOFOL N/A 01/22/2016   Procedure:  ESOPHAGOGASTRODUODENOSCOPY (EGD) WITH PROPOFOL;  Surgeon: Lollie Sails, MD;  Location: [facility] ENDOSCOPY;  Service: Endoscopy;  Laterality: N/A;  . GASTRIC BYPASS  2005  . HERNIA REPAIR    . NASAL SINUS SURGERY    . TOTAL THYROIDECTOMY    . TUBAL LIGATION     Family History:  Family History  Problem Relation Age of Onset  . Heart disease Mother   . COPD Mother   . Arthritis Mother   . Cancer Mother        uterine  . Lupus Mother   . Anxiety disorder Mother   . Depression Mother   . Drug abuse Mother   . COPD Father   . Arthritis Father   . Heart disease Father   . Heart attack Father   . Alcohol abuse Father   . Heart disease Brother   . Heart attack Brother   . Drug abuse Brother   . Anxiety disorder Brother   . Depression Brother   . Heart disease Brother   . Diabetes Maternal Grandmother   . Diabetes Maternal Grandfather   . Diabetes Paternal Grandmother   . Diabetes Paternal Grandfather    Social History:  Social History   Socioeconomic History  . Marital status: Divorced    Spouse name: None  . Number of children: 2  . Years of education: None  . Highest education level: High school graduate  Social Needs  .  Financial resource strain: Not hard at all  . Food insecurity - worry: Never true  . Food insecurity - inability: Never true  . Transportation needs - medical: No  . Transportation needs - non-medical: No  Occupational History  . None  Tobacco Use  . Smoking status: Current Every Day Smoker    Packs/day: 1.00    Years: 30.00    Pack years: 30.00    Types: Cigarettes    Start date: 03/15/1980  . Smokeless tobacco: Never Used  Substance and Sexual Activity  . Alcohol use: Yes    Alcohol/week: 6.0 - 7.2 oz    Types: 8 - 10 Glasses of wine, 2 Standard drinks or equivalent per week  . Drug use: No  . Sexual activity: Yes    Partners: Male  Other Topics Concern  . None  Social History Narrative   Lives in [place] with husband.      Work -  [employer]      Diet - healthy diet   Exercise - limited   Caffeine use: daily   Additional History:   Assessment:   Musculoskeletal: Strength & Muscle Tone: within normal limits Gait & Station: normal Patient leans: N/A  Psychiatric Specialty Exam: Medication Refill   Anxiety  Patient reports no insomnia, nervous/anxious behavior or suicidal ideas.      Review of Systems  Musculoskeletal: Negative for joint pain.  Psychiatric/Behavioral: Negative for depression, hallucinations, memory loss, substance abuse and suicidal ideas. The patient is not nervous/anxious and does not have insomnia.   All other systems reviewed and are negative.   Blood pressure (!) 154/83, pulse 99, temperature 97.9 F (36.6 C), temperature source Oral, weight 209 lb 6.4 oz (95 kg).Body mass index is 37.09 kg/m.  General Appearance: Well Groomed  Eye Contact:  Good  Speech:  Normal Rate  Volume:  Normal  Mood:  Anxious and Depressed  Affect:  Depressed and Tearful  Thought Process:  Linear  Orientation:  Full (Time, Place, and Person)  Thought Content:  WDL  Suicidal Thoughts:  No  Homicidal Thoughts:  No  Memory:  Immediate;   Good Recent;   Good Remote;   Good  Judgement:  Good  Insight:  Good  Psychomotor Activity:  Normal  Concentration:  Good  Recall:  Good  Fund of Knowledge: Good  Language: Good  Akathisia:  Negative  Handed:   AIMS (if indicated):  Done 05/16/15 and normal  Assets:  Communication Skills Desire for Improvement Social Support Talents/Skills  ADL's:  Intact  Cognition: WNL  Sleep:  good   Is the patient at risk to self?  No. Has the patient been a risk to self in the past 6 months?  No. Has the patient been a risk to self within the distant past?  No. Is the patient a risk to others?  No. Has the patient been a risk to others in the past 6 months?  No. Has the patient been a risk to others within the distant past?  No.  Current Medications: Current  Outpatient Medications  Medication Sig Dispense Refill  . SYNTHROID 200 MCG tablet TAKE 1 TABLET BY MOUTH ONCE DAILY WITH BREAKFAST 30 tablet 0  . budesonide-formoterol (SYMBICORT) 80-4.5 MCG/ACT inhaler Inhale 2 puffs into the lungs 2 (two) times daily. (Patient not taking: Reported on 05/19/2017) 1 Inhaler 3  . cefdinir (OMNICEF) 300 MG capsule Take 1 capsule (300 mg total) 2 (two) times daily by mouth. (Patient not taking: Reported on  05/19/2017) 20 capsule 0  . cetirizine (ZYRTEC) 10 MG tablet Take by mouth.    . Cholecalciferol (VITAMIN D) 2000 units CAPS Take by mouth.    Marland Kitchen lisinopril (PRINIVIL,ZESTRIL) 20 MG tablet Take 1 tablet (20 mg total) by mouth daily. (Patient not taking: Reported on 05/19/2017) 90 tablet 2  . montelukast (SINGULAIR) 10 MG tablet TAKE ONE TABLET BY MOUTH AT BEDTIME (Patient not taking: Reported on 05/19/2017) 90 tablet 3  . pantoprazole (PROTONIX) 40 MG tablet Take 1 tablet (40 mg total) by mouth daily. (Patient not taking: Reported on 05/19/2017) 90 tablet 3  . venlafaxine XR (EFFEXOR-XR) 150 MG 24 hr capsule Take 1 capsule (150 mg total) by mouth daily with breakfast. (Patient not taking: Reported on 05/19/2017) 30 capsule 1   No current facility-administered medications for this visit.     Medical Decision Making:  Established Problem, Stable/Improving (1), Review of Medication Regimen & Side Effects (2) and Review of New Medication or Change in Dosage (2)  Treatment Plan Summary:Medication management and Plan   I will start her on Cymbalta 30 mg by mouth daily. She will titrate the dose to 60 mg in 1 week. Discussed with her about the side effects of the medications and she agreed with the plan. She will be started on Topamax 50 mg by mouth daily to help with the weight loss. She will be started on trazodone 100 mg by mouth daily at bedtime for insomnia. Follow-up in one month or earlier depending on her symptoms Discussed her about the side effects the medication  and she agreed with the plan      More than 50% of the time spent in psychoeducation, counseling and coordination of care.    This note was generated in part or whole with voice recognition software. Voice regonition is usually quite accurate but there are transcription errors that can and very often do occur. I apologize for any typographical errors that were not detected and corrected.   Rainey Pines, MD  05/19/2017, 3:45 PM

## 2017-05-24 ENCOUNTER — Other Ambulatory Visit: Payer: Self-pay | Admitting: Family Medicine

## 2017-05-28 ENCOUNTER — Ambulatory Visit: Payer: Self-pay | Admitting: Internal Medicine

## 2017-06-03 ENCOUNTER — Telehealth: Payer: Self-pay | Admitting: Internal Medicine

## 2017-06-03 NOTE — Telephone Encounter (Unsigned)
Copied from [place] [id number]. Topic: Appointment Scheduling - New Patient >> Jun 03, 2017  1:06 PM Neva Seat wrote: Pt had an establish care appt with Dr. Terese Door on Dec. 12th.  She was a pt with Dr. Lacinda Axon.  However, the appt was canceled due to the weather.  She called to reschedule.  There was no New Pt availability with Dr. Aundra Dubin in time for her to get a refill on her medications Lisinopril and Synthroid.  Please call her asap to schedule an appt for pt to be seen with Dr. Aundra Dubin and to work out an arrangement for pt to receive the meds she is needing.  Her preferred pharmacy is [facility] on [address] [place]

## 2017-06-03 NOTE — Telephone Encounter (Signed)
Can we call and schedule OV for her to come in Thursday or Friday?

## 2017-06-23 ENCOUNTER — Other Ambulatory Visit: Payer: Self-pay

## 2017-06-23 MED ORDER — LISINOPRIL 20 MG PO TABS: 20.0000 mg | ORAL_TABLET | Freq: Every day | ORAL | 0 refills | Status: DC

## 2017-06-23 MED ORDER — SYNTHROID 200 MCG PO TABS: ORAL_TABLET | ORAL | 0 refills | Status: DC

## 2017-06-23 NOTE — Telephone Encounter (Signed)
No office visit scheduled  Previous office visit scheduled cancelled due to weather  Spoke to patient she is completely out of medication Office visit scheduled of 07/23/16

## 2017-07-14 ENCOUNTER — Ambulatory Visit: Payer: 59 | Admitting: Psychiatry

## 2017-07-23 ENCOUNTER — Encounter: Payer: Self-pay | Admitting: Internal Medicine

## 2017-07-23 ENCOUNTER — Ambulatory Visit: Payer: Managed Care, Other (non HMO) | Admitting: Internal Medicine

## 2017-07-23 VITALS — BP 160/90 | HR 71 | Temp 98.1°F | Ht 63.0 in | Wt 209.6 lb

## 2017-07-23 DIAGNOSIS — J4530 Mild persistent asthma, uncomplicated: Secondary | ICD-10-CM | POA: Diagnosis not present

## 2017-07-23 DIAGNOSIS — E538 Deficiency of other specified B group vitamins: Secondary | ICD-10-CM

## 2017-07-23 DIAGNOSIS — E785 Hyperlipidemia, unspecified: Secondary | ICD-10-CM | POA: Diagnosis not present

## 2017-07-23 DIAGNOSIS — R7303 Prediabetes: Secondary | ICD-10-CM | POA: Diagnosis not present

## 2017-07-23 DIAGNOSIS — Z1231 Encounter for screening mammogram for malignant neoplasm of breast: Secondary | ICD-10-CM

## 2017-07-23 DIAGNOSIS — Z780 Asymptomatic menopausal state: Secondary | ICD-10-CM

## 2017-07-23 DIAGNOSIS — I10 Essential (primary) hypertension: Secondary | ICD-10-CM

## 2017-07-23 DIAGNOSIS — K219 Gastro-esophageal reflux disease without esophagitis: Secondary | ICD-10-CM

## 2017-07-23 DIAGNOSIS — E559 Vitamin D deficiency, unspecified: Secondary | ICD-10-CM

## 2017-07-23 DIAGNOSIS — F329 Major depressive disorder, single episode, unspecified: Secondary | ICD-10-CM

## 2017-07-23 DIAGNOSIS — E039 Hypothyroidism, unspecified: Secondary | ICD-10-CM | POA: Diagnosis not present

## 2017-07-23 DIAGNOSIS — F419 Anxiety disorder, unspecified: Secondary | ICD-10-CM | POA: Diagnosis not present

## 2017-07-23 DIAGNOSIS — Z13818 Encounter for screening for other digestive system disorders: Secondary | ICD-10-CM | POA: Diagnosis not present

## 2017-07-23 MED ORDER — LISINOPRIL 20 MG PO TABS: 20.0000 mg | ORAL_TABLET | Freq: Every day | ORAL | 0 refills | Status: DC

## 2017-07-23 MED ORDER — BUDESONIDE-FORMOTEROL FUMARATE 80-4.5 MCG/ACT IN AERO: 2.0000 | INHALATION_SPRAY | Freq: Two times a day (BID) | RESPIRATORY_TRACT | 11 refills | Status: DC

## 2017-07-23 MED ORDER — CETIRIZINE HCL 10 MG PO TABS: 10.0000 mg | ORAL_TABLET | Freq: Every day | ORAL | 0 refills | Status: DC

## 2017-07-23 MED ORDER — TOPIRAMATE 50 MG PO TABS: 50.0000 mg | ORAL_TABLET | Freq: Two times a day (BID) | ORAL | 1 refills | Status: DC

## 2017-07-23 MED ORDER — PANTOPRAZOLE SODIUM 40 MG PO TBEC: 40.0000 mg | DELAYED_RELEASE_TABLET | Freq: Every day | ORAL | 3 refills | Status: DC

## 2017-07-23 MED ORDER — MONTELUKAST SODIUM 10 MG PO TABS: 10.0000 mg | ORAL_TABLET | Freq: Every day | ORAL | 3 refills | Status: DC

## 2017-07-23 MED ORDER — ALBUTEROL SULFATE HFA 108 (90 BASE) MCG/ACT IN AERS: 2.0000 | INHALATION_SPRAY | Freq: Four times a day (QID) | RESPIRATORY_TRACT | 11 refills | Status: DC | PRN

## 2017-07-23 MED ORDER — DULOXETINE HCL 30 MG PO CPEP: 60.0000 mg | ORAL_CAPSULE | Freq: Every day | ORAL | 1 refills | Status: DC

## 2017-07-23 NOTE — Patient Instructions (Signed)
F/u in 1-2 months    Tobacco Use Disorder Tobacco use disorder (TUD) is a mental disorder. It is the long-term use of tobacco in spite of related health problems or difficulty with normal life activities. Tobacco is most commonly smoked as cigarettes and less commonly as cigars or pipes. Smokeless chewing tobacco and snuff are also popular. People with TUD get a feeling of extreme pleasure (euphoria) from using tobacco and have a desire to use it again and again. Repeated use of tobacco can cause problems. The addictive effects of tobacco are due mainly tothe ingredient nicotine. Nicotine also causes a rush of adrenaline (epinephrine) in the body. This leads to increased blood pressure, heart rate, and breathing rate. These changes may cause problems for people with high blood pressure, weak hearts, or lung disease. High doses of nicotine in children and pets can lead to seizures and death. Tobacco contains a number of other unsafe chemicals. These chemicals are especially harmful when inhaled as smoke and can damage almost every organ in the body. Smokers live shorter lives than nonsmokers and are at risk of dying from a number of diseases and cancers. Tobacco smoke can also cause health problems for nonsmokers (due to inhaling secondhand smoke). Smoking is also a fire hazard. TUD usually starts in the late teenage years and is most common in young adults between the ages of 63 and 76 years. People who start smoking earlier in life are more likely to continue smoking as adults. TUD is somewhat more common in men than women. People with TUD are at higher risk for using alcohol and other drugs of abuse. What increases the risk? Risk factors for TUD include:  Having family members with the disorder.  Being around people who use tobacco.  Having an existing mental health issue such as schizophrenia, depression, bipolar disorder, ADHD, or posttraumatic stress disorder (PTSD).  What are the signs or  symptoms? People with tobacco use disorder have two or more of the following signs and symptoms within 12 months:  Use of more tobacco over a longer period than intended.  Not able to cut down or control tobacco use.  A lot of time spent obtaining or using tobacco.  Strong desire or urge to use tobacco (craving). Cravings may last for 6 months or longer after quitting.  Use of tobacco even when use leads to major problems at work, school, or home.  Use of tobacco even when use leads to relationship problems.  Giving up or cutting down on important life activities because of tobacco use.  Repeatedly using tobacco in situations where it puts you or others in physical danger, like smoking in bed.  Use of tobacco even when it is known that a physical or mental problem is likely related to tobacco use. ? Physical problems are numerous and may include chronic bronchitis, emphysema, lung and other cancers, gum disease, high blood pressure, heart disease, and stroke. ? Mental problems caused by tobacco may include difficulty sleeping and anxiety.  Need to use greater amounts of tobacco to get the same effect. This means you have developed a tolerance.  Withdrawal symptoms as a result of stopping or rapidly cutting back use. These symptoms may last a month or more after quitting and include the following: ? Depressed, anxious, or irritable mood. ? Difficulty concentrating. ? Increased appetite. ? Restlessness or trouble sleeping. ? Use of tobacco to avoid withdrawal symptoms.  How is this diagnosed? Tobacco use disorder is diagnosed by your health care  provider. A diagnosis may be made by:  Your health care provider asking questions about your tobacco use and any problems it may be causing.  A physical exam.  Lab tests.  You may be referred to a mental health professional or addiction specialist.  The severity of tobacco use disorder depends on the number of signs and symptoms you  have:  Mild-Two or three symptoms.  Moderate-Four or five symptoms.  Severe-Six or more symptoms.  How is this treated? Many people with tobacco use disorder are unable to quit on their own and need help. Treatment options include the following:  Nicotine replacement therapy (NRT). NRT provides nicotine without the other harmful chemicals in tobacco. NRT gradually lowers the dosage of nicotine in the body and reduces withdrawal symptoms. NRT is available in over-the-counter forms (gum, lozenges, and skin patches) as well as prescription forms (mouth inhaler and nasal spray).  Medicines.This may include: ? Antidepressant medicine that may reduce nicotine cravings. ? A medicine that acts on nicotine receptors in the brain to reduce cravings and withdrawal symptoms. It may also block the effects of tobacco in people with TUD who relapse.  Counseling or talk therapy. A form of talk therapy called behavioral therapy is commonly used to treat people with TUD. Behavioral therapy looks at triggers for tobacco use, how to avoid them, and how to cope with cravings. It is most effective in person or by phone but is also available in self-help forms (books and Internet websites).  Support groups. These provide emotional support, advice, and guidance for quitting tobacco.  The most effective treatment for TUD is usually a combination of medicine, talk therapy, and support groups. Follow these instructions at home:  Keep all follow-up visits as directed by your health care provider. This is important.  Take medicines only as directed by your health care provider.  Check with your health care provider before starting new prescription or over-the-counter medicines. Contact a health care provider if:  You are not able to take your medicines as prescribed.  Treatment is not helping your TUD and your symptoms get worse. Get help right away if:  You have serious thoughts about hurting yourself or  others.  You have trouble breathing, chest pain, sudden weakness, or sudden numbness in part of your body. This information is not intended to replace advice given to you by your health care provider. Make sure you discuss any questions you have with your health care provider. Document Released: 02/07/2004 Document Revised: 02/04/2016 Document Reviewed: 07/30/2013 Elsevier Interactive Patient Education  2018 [employer].   Bronchospasm, Adult Bronchospasm is a tightening of the airways going into the lungs. During an episode, it may be harder to breathe. You may cough, and you may make a whistling sound when you breathe (wheeze). This condition often affects people with asthma. What are the causes? This condition is caused by swelling and irritation in the airways. It can be triggered by:  An infection (common).  Seasonal allergies.  An allergic reaction.  Exercise.  Irritants. These include pollution, cigarette smoke, strong odors, aerosol sprays, and paint fumes.  Weather changes. Winds increase molds and pollens in the air. Cold air may cause swelling.  Stress and emotional upset.  What are the signs or symptoms? Symptoms of this condition include:  Wheezing. If the episode was triggered by an allergy, wheezing may start right away or hours later.  Nighttime coughing.  Frequent or severe coughing with a simple cold.  Chest tightness.  Shortness  of breath.  Decreased ability to exercise.  How is this diagnosed? This condition is usually diagnosed with a review of your medical history and a physical exam. Tests, such as lung function tests, are sometimes done to look for other conditions. The need for a chest X-ray depends on where the wheezing occurs and whether it is the first time you have wheezed. How is this treated? This condition may be treated with:  Inhaled medicines. These open up the airways and help you breathe. They can be taken with an inhaler or a  nebulizer device.  Corticosteroid medicines. These may be given for severe bronchospasm, usually when it is associated with asthma.  Avoiding triggers, such as irritants, infection, or allergies.  Follow these instructions at home: Medicines  Take over-the-counter and prescription medicines only as told by your health care provider.  If you need to use an inhaler or nebulizer to take your medicine, ask your health care provider to explain how to use it correctly. If you were given a spacer, always use it with your inhaler. Lifestyle  Reduce the number of triggers in your home. To do this: ? Change your heating and air conditioning filter at least once a month. ? Limit your use of fireplaces and wood stoves. ? Do not smoke. Do not allow smoking in your home. ? Avoid using perfumes and fragrances. ? Get rid of pests, such as roaches and mice, and their droppings. ? Remove any mold from your home. ? Keep your house clean and dust free. Use unscented cleaning products. ? Replace carpet with wood, tile, or vinyl flooring. Carpet can trap dander and dust. ? Use allergy-proof pillows, mattress covers, and box spring covers. ? Wash bed sheets and blankets every week in hot water. Dry them in a dryer. ? Use blankets that are made of polyester or cotton. ? Wash your hands often. ? Do not allow pets in your bedroom.  Avoid breathing in cold air when you exercise. General instructions  Have a plan for seeking medical care. Know when to call your health care provider and local emergency services, and where to get emergency care.  Stay up to date on your immunizations.  When you have an episode of bronchospasm, stay calm. Try to relax and breathe more slowly.  If you have asthma, make sure you have an asthma action plan.  Keep all follow-up visits as told by your health care provider. This is important. Contact a health care provider if:  You have muscle aches.  You have chest  pain.  The mucus that you cough up (sputum) changes from clear or white to yellow, green, gray, or bloody.  You have a fever.  Your sputum gets thicker. Get help right away if:  Your wheezing and coughing get worse, even after you take your prescribed medicines.  It gets even harder to breathe.  You develop severe chest pain. Summary  Bronchospasm is a tightening of the airways going into the lungs.  During an episode of bronchospasm, you may have a harder time breathing. You may cough and make a whistling sound when you breathe (wheeze).  Avoid exposure to triggers such as smoke, dust, mold, animal dander, and fragrances.  When you have an episode of bronchospasm, stay calm. Try to relax and breathe more slowly. This information is not intended to replace advice given to you by your health care provider. Make sure you discuss any questions you have with your health care provider. Document Released: 06/06/2003  Document Revised: 05/30/2016 Document Reviewed: 05/30/2016 Elsevier Interactive Patient Education  2017 [employer].

## 2017-07-23 NOTE — Progress Notes (Signed)
Pre visit review using our clinic review tool, if applicable. No additional management support is needed unless otherwise documented below in the visit note. 

## 2017-07-25 ENCOUNTER — Other Ambulatory Visit: Payer: Managed Care, Other (non HMO)

## 2017-07-25 ENCOUNTER — Other Ambulatory Visit (INDEPENDENT_AMBULATORY_CARE_PROVIDER_SITE_OTHER): Payer: Managed Care, Other (non HMO)

## 2017-07-25 DIAGNOSIS — E559 Vitamin D deficiency, unspecified: Secondary | ICD-10-CM | POA: Diagnosis not present

## 2017-07-25 DIAGNOSIS — Z780 Asymptomatic menopausal state: Secondary | ICD-10-CM

## 2017-07-25 DIAGNOSIS — E039 Hypothyroidism, unspecified: Secondary | ICD-10-CM

## 2017-07-25 DIAGNOSIS — I10 Essential (primary) hypertension: Secondary | ICD-10-CM

## 2017-07-25 DIAGNOSIS — E538 Deficiency of other specified B group vitamins: Secondary | ICD-10-CM | POA: Diagnosis not present

## 2017-07-25 DIAGNOSIS — Z13818 Encounter for screening for other digestive system disorders: Secondary | ICD-10-CM | POA: Diagnosis not present

## 2017-07-25 DIAGNOSIS — R7303 Prediabetes: Secondary | ICD-10-CM | POA: Diagnosis not present

## 2017-07-25 DIAGNOSIS — E785 Hyperlipidemia, unspecified: Secondary | ICD-10-CM

## 2017-07-25 LAB — CBC WITH DIFFERENTIAL/PLATELET
Basophils Absolute: 20 cells/uL (ref 0–200)
Basophils Relative: 0.3 %
EOS PCT: 0.3 %
Eosinophils Absolute: 20 cells/uL (ref 15–500)
HCT: 39.3 % (ref 35.0–45.0)
HEMOGLOBIN: 13.3 g/dL (ref 11.7–15.5)
Lymphs Abs: 2613 cells/uL (ref 850–3900)
MCH: 30.7 pg (ref 27.0–33.0)
MCHC: 33.8 g/dL (ref 32.0–36.0)
MCV: 90.8 fL (ref 80.0–100.0)
MONOS PCT: 7.8 %
MPV: 11.3 fL (ref 7.5–12.5)
NEUTROS ABS: 3524 {cells}/uL (ref 1500–7800)
Neutrophils Relative %: 52.6 %
PLATELETS: 264 10*3/uL (ref 140–400)
RBC: 4.33 10*6/uL (ref 3.80–5.10)
RDW: 14.1 % (ref 11.0–15.0)
TOTAL LYMPHOCYTE: 39 %
WBC mixed population: 523 cells/uL (ref 200–950)
WBC: 6.7 10*3/uL (ref 3.8–10.8)

## 2017-07-25 LAB — LIPID PANEL
CHOL/HDL RATIO: 2
CHOLESTEROL: 188 mg/dL (ref 0–200)
HDL: 75.8 mg/dL (ref >39.00)
LDL Cholesterol: 93 mg/dL (ref 0–99)
NonHDL: 112.3
TRIGLYCERIDES: 95 mg/dL (ref 0.0–149.0)
VLDL: 19 mg/dL (ref 0.0–40.0)

## 2017-07-25 LAB — URINALYSIS, ROUTINE W REFLEX MICROSCOPIC
Bilirubin Urine: NEGATIVE
HGB URINE DIPSTICK: NEGATIVE
KETONES UR: NEGATIVE
Nitrite: NEGATIVE
RBC / HPF: NONE SEEN (ref 0–?)
SPECIFIC GRAVITY, URINE: 1.015 (ref 1.000–1.030)
Total Protein, Urine: NEGATIVE
Urine Glucose: NEGATIVE
Urobilinogen, UA: 0.2 (ref 0.0–1.0)
pH: 6 (ref 5.0–8.0)

## 2017-07-25 LAB — COMPREHENSIVE METABOLIC PANEL
ALBUMIN: 3.1 g/dL — AB (ref 3.5–5.2)
ALK PHOS: 41 U/L (ref 39–117)
ALT: 12 U/L (ref 0–35)
AST: 13 U/L (ref 0–37)
BILIRUBIN TOTAL: 0.5 mg/dL (ref 0.2–1.2)
BUN: 11 mg/dL (ref 6–23)
CALCIUM: 9.5 mg/dL (ref 8.4–10.5)
CO2: 29 meq/L (ref 19–32)
CREATININE: 0.68 mg/dL (ref 0.40–1.20)
Chloride: 104 mEq/L (ref 96–112)
GFR: 95.88 mL/min (ref >60.00)
Glucose, Bld: 90 mg/dL (ref 70–99)
Potassium: 4.5 mEq/L (ref 3.5–5.1)
Sodium: 138 mEq/L (ref 135–145)
TOTAL PROTEIN: 7.3 g/dL (ref 6.0–8.3)

## 2017-07-25 LAB — VITAMIN D 25 HYDROXY (VIT D DEFICIENCY, FRACTURES): VITD: 18.49 ng/mL — AB (ref 30.00–100.00)

## 2017-07-25 LAB — VITAMIN B12: VITAMIN B 12: 137 pg/mL — AB (ref 211–911)

## 2017-07-25 LAB — TSH: TSH: 21.14 u[IU]/mL — ABNORMAL HIGH (ref 0.35–4.50)

## 2017-07-25 LAB — HEMOGLOBIN A1C: Hgb A1c MFr Bld: 5.7 % (ref 4.6–6.5)

## 2017-07-25 LAB — FOLLICLE STIMULATING HORMONE: FSH: 21 m[IU]/mL

## 2017-07-26 ENCOUNTER — Encounter: Payer: Self-pay | Admitting: Internal Medicine

## 2017-07-26 DIAGNOSIS — J45909 Unspecified asthma, uncomplicated: Secondary | ICD-10-CM | POA: Insufficient documentation

## 2017-07-26 DIAGNOSIS — E538 Deficiency of other specified B group vitamins: Secondary | ICD-10-CM | POA: Insufficient documentation

## 2017-07-26 LAB — HEPATITIS B SURFACE ANTIBODY, QUANTITATIVE: Hepatitis B-Post: <5 m[IU]/mL — ABNORMAL LOW (ref >=10)

## 2017-07-26 LAB — HEPATITIS C ANTIBODY
HEP C AB: NONREACTIVE
SIGNAL TO CUT-OFF: 0 (ref <1.00)

## 2017-07-26 MED ORDER — CHOLECALCIFEROL 1.25 MG (50000 UT) PO CAPS: 50000.0000 [IU] | ORAL_CAPSULE | ORAL | 1 refills | Status: DC

## 2017-07-26 MED ORDER — SYNTHROID 200 MCG PO TABS: ORAL_TABLET | ORAL | 0 refills | Status: DC

## 2017-07-26 NOTE — Progress Notes (Signed)
Chief Complaint  Patient presents with  . Follow-up    transfer from Dr. Lacinda Axon   Follow up  1. HTN uncontrolled has not taken meds needs refill Lisinopril 20 mg qd  2. Allergies needs refill of Singulair and zyrtec 3. Hypothyroidism on synthryoid 200 mcg qd will need refill  4. She c/o wt gain  5. H/o asthma/bronchitis still smoking x 30 years 1 ppd She is not using Symbicort inhaler regularly. She c/o wheezing and dry cough  6. 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 H/o anxiety/depression following with Dr. Gretel Acre. She does think she is going through menopause and c/o hot flashes and mood change     Review of Systems  Constitutional: Negative for weight loss.  HENT: Negative for hearing loss.   Eyes:       No vision changes   Respiratory: Positive for cough and wheezing.   Cardiovascular: Negative for chest pain.  Gastrointestinal: Negative for abdominal pain.  Musculoskeletal: Negative for falls.  Skin: Negative for rash.  Neurological: Negative for headaches.  Psychiatric/Behavioral: Negative for depression. The patient is not nervous/anxious.    Past Medical History:  Diagnosis Date  . Anxiety   . Arthritis   . Asthma   . Depression   . GERD (gastroesophageal reflux disease)   . Hypertension   . Hypothyroidism   . Pneumonia   . Thyroid disease    Past Surgical History:  Procedure Laterality Date  . ABLATION  2006  . BREAST BIOPSY Left 1998  . BREAST SURGERY     cyst aspiration  . BREAST SURGERY  1998   cyst aspiration  . COLONOSCOPY  03/2014  . ENDOMETRIAL ABLATION    . ESOPHAGOGASTRODUODENOSCOPY (EGD) WITH PROPOFOL N/A 08/15/2015   Procedure: ESOPHAGOGASTRODUODENOSCOPY (EGD) WITH PROPOFOL;  Surgeon: Lollie Sails, MD;  Location: [facility] ENDOSCOPY;  Service: Endoscopy;  Laterality: N/A;  . ESOPHAGOGASTRODUODENOSCOPY (EGD) WITH PROPOFOL N/A 01/22/2016   Procedure: ESOPHAGOGASTRODUODENOSCOPY (EGD) WITH PROPOFOL;  Surgeon: Lollie Sails, MD;   Location: [facility] ENDOSCOPY;  Service: Endoscopy;  Laterality: N/A;  . GASTRIC BYPASS  2005  . HERNIA REPAIR    . NASAL SINUS SURGERY    . TOTAL THYROIDECTOMY    . TUBAL LIGATION     Family History  Problem Relation Age of Onset  . Heart disease Mother   . COPD Mother   . Arthritis Mother   . Cancer Mother        uterine  . Lupus Mother   . Anxiety disorder Mother   . Depression Mother   . Drug abuse Mother   . COPD Father   . Arthritis Father   . Heart disease Father   . Heart attack Father   . Alcohol abuse Father   . Heart disease Brother   . Heart attack Brother   . Drug abuse Brother   . Anxiety disorder Brother   . Depression Brother   . Heart disease Brother   . Diabetes Maternal Grandmother   . Diabetes Maternal Grandfather   . Diabetes Paternal Grandmother   . Diabetes Paternal Grandfather    Social History   Socioeconomic History  . Marital status: Divorced    Spouse name: Not on file  . Number of children: 2  . Years of education: Not on file  . Highest education level: High school graduate  Social Needs  . Financial resource strain: Not hard at all  . Food insecurity - worry: Never true  .  Food insecurity - inability: Never true  . Transportation needs - medical: No  . Transportation needs - non-medical: No  Occupational History  . Not on file  Tobacco Use  . Smoking status: Current Every Day Smoker    Packs/day: 1.00    Years: 30.00    Pack years: 30.00    Types: Cigarettes    Start date: 03/15/1980  . Smokeless tobacco: Never Used  Substance and Sexual Activity  . Alcohol use: Yes    Alcohol/week: 6.0 - 7.2 oz    Types: 8 - 10 Glasses of wine, 2 Standard drinks or equivalent per week  . Drug use: No  . Sexual activity: Yes    Partners: Male  Other Topics Concern  . Not on file  Social History Narrative   Lives in [place] with husband.      Work - [employer] center      Diet - healthy diet   Exercise - limited   Caffeine use: daily    Current Meds  Medication Sig  . budesonide-formoterol (SYMBICORT) 80-4.5 MCG/ACT inhaler Inhale 2 puffs into the lungs 2 (two) times daily. Rinse mouth  . cefdinir (OMNICEF) 300 MG capsule Take 1 capsule (300 mg total) 2 (two) times daily by mouth.  . cetirizine (ZYRTEC) 10 MG tablet Take 1 tablet (10 mg total) by mouth daily.  . Cholecalciferol (VITAMIN D) 2000 units CAPS Take by mouth.  . DULoxetine (CYMBALTA) 30 MG capsule Take 2 capsules (60 mg total) by mouth daily.  Marland Kitchen lisinopril (PRINIVIL,ZESTRIL) 20 MG tablet Take 1 tablet (20 mg total) by mouth daily.  . montelukast (SINGULAIR) 10 MG tablet Take 1 tablet (10 mg total) by mouth at bedtime.  . pantoprazole (PROTONIX) 40 MG tablet Take 1 tablet (40 mg total) by mouth daily.  Marland Kitchen SYNTHROID 200 MCG tablet TAKE 1 TABLET BY MOUTH ONCE DAILY WITH BREAKFAST  . topiramate (TOPAMAX) 50 MG tablet Take 1 tablet (50 mg total) by mouth 2 (two) times daily.  . traZODone (DESYREL) 100 MG tablet Take 1 tablet (100 mg total) by mouth at bedtime.  . [DISCONTINUED] budesonide-formoterol (SYMBICORT) 80-4.5 MCG/ACT inhaler Inhale 2 puffs into the lungs 2 (two) times daily.  . [DISCONTINUED] cetirizine (ZYRTEC) 10 MG tablet Take by mouth.  . [DISCONTINUED] cetirizine (ZYRTEC) 10 MG tablet Take 1 tablet (10 mg total) by mouth daily.  . [DISCONTINUED] DULoxetine (CYMBALTA) 30 MG capsule Take 1 capsule (30 mg total) by mouth daily. Take 1 pill in am x 1 week then 2 pills daily  . [DISCONTINUED] lisinopril (PRINIVIL,ZESTRIL) 20 MG tablet Take 1 tablet (20 mg total) by mouth daily.  . [DISCONTINUED] lisinopril (PRINIVIL,ZESTRIL) 20 MG tablet Take 1 tablet (20 mg total) by mouth daily.  . [DISCONTINUED] montelukast (SINGULAIR) 10 MG tablet TAKE ONE TABLET BY MOUTH AT BEDTIME  . [DISCONTINUED] montelukast (SINGULAIR) 10 MG tablet Take 1 tablet (10 mg total) by mouth at bedtime.  . [DISCONTINUED] pantoprazole (PROTONIX) 40 MG tablet Take 1 tablet (40 mg total) by  mouth daily.  . [DISCONTINUED] topiramate (TOPAMAX) 50 MG tablet Take 1 tablet (50 mg total) by mouth daily.   No Known Allergies Recent Results (from the past 2160 hour(s))  POCT Influenza A/B (POC66)     Status: Normal   Collection Time: 05/02/17 10:49 AM  Result Value Ref Range   Influenza A, POC Negative Negative   Influenza B, POC Negative Negative  Hepatitis C antibody     Status: None   Collection Time: 07/25/17  8:23 AM  Result Value Ref Range   Hepatitis C Ab NON-REACTIVE NON-REACTI   SIGNAL TO CUT-OFF 0.00 <1.00  Hepatitis B surface antibody     Status: Abnormal   Collection Time: 07/25/17  8:23 AM  Result Value Ref Range   Hepatitis B-Post <5 (L) > OR = 10 mIU/mL    Comment: . Patient does not have immunity to hepatitis B virus. . For additional information, please refer to http://education.[facility].com/faq/FAQ105 (This link is being provided for informational/ educational purposes only).   [place]     Status: None   Collection Time: 07/25/17  8:23 AM  Result Value Ref Range   FSH 21.0 mIU/ML    Comment: Female Reference Range:  1.4-18.1 mIU/mLFemale Reference Range:Follicular Phase          2.5-10.2 mIU/mLMidCycle Peak          3.4-33.4 mIU/mLLuteal Phase          1.5-9.1 mIU/mLPost Menopausal     23.0-116.3 mIU/mLPregnant          <0.3 mIU/mL  B12     Status: Abnormal   Collection Time: 07/25/17  8:23 AM  Result Value Ref Range   Vitamin B-12 137 (L) 211 - 911 pg/mL  Vitamin D (25 hydroxy)     Status: Abnormal   Collection Time: 07/25/17  8:23 AM  Result Value Ref Range   VITD 18.49 (L) 30.00 - 100.00 ng/mL  TSH     Status: Abnormal   Collection Time: 07/25/17  8:23 AM  Result Value Ref Range   TSH 21.14 (H) 0.35 - 4.50 uIU/mL  Urinalysis, Routine w reflex microscopic     Status: Abnormal   Collection Time: 07/25/17  8:23 AM  Result Value Ref Range   Color, Urine YELLOW Yellow;Lt. Yellow   APPearance CLEAR Clear   Specific Gravity, Urine 1.015 1.000 -  1.030   pH 6.0 5.0 - 8.0   Total Protein, Urine NEGATIVE Negative   Urine Glucose NEGATIVE Negative   Ketones, ur NEGATIVE Negative   Bilirubin Urine NEGATIVE Negative   Hgb urine dipstick NEGATIVE Negative   Urobilinogen, UA 0.2 0.0 - 1.0   Leukocytes, UA TRACE (A) Negative   Nitrite NEGATIVE Negative   WBC, UA 3-6/hpf (A) 0-2/hpf   RBC / HPF none seen 0-2/hpf   Squamous Epithelial / LPF Rare(0-4/hpf) Rare(0-4/hpf)   Bacteria, UA Few(10-50/hpf) (A) None  Hemoglobin A1c     Status: None   Collection Time: 07/25/17  8:23 AM  Result Value Ref Range   Hgb A1c MFr Bld 5.7 4.6 - 6.5 %    Comment: Glycemic Control Guidelines for People with Diabetes:Non Diabetic:  <6%Goal of Therapy: <7%Additional Action Suggested:  >8%   Lipid panel     Status: None   Collection Time: 07/25/17  8:23 AM  Result Value Ref Range   Cholesterol 188 0 - 200 mg/dL    Comment: ATP III Classification       Desirable:  < 200 mg/dL               Borderline High:  200 - 239 mg/dL          High:  > = 240 mg/dL   Triglycerides 95.0 0.0 - 149.0 mg/dL    Comment: Normal:  <150 mg/dLBorderline High:  150 - 199 mg/dL   HDL 75.80 >39.00 mg/dL   VLDL 19.0 0.0 - 40.0 mg/dL   LDL Cholesterol 93 0 - 99 mg/dL   Total CHOL/HDL Ratio  2     Comment:                Men          Women1/2 Average Risk     3.4          3.3Average Risk          5.0          4.42X Average Risk          9.6          7.13X Average Risk          15.0          11.0                       NonHDL 112.30     Comment: NOTE:  Non-HDL goal should be 30 mg/dL higher than patient's LDL goal (i.e. LDL goal of < 70 mg/dL, would have non-HDL goal of < 100 mg/dL)  Comprehensive metabolic panel     Status: Abnormal   Collection Time: 07/25/17  8:23 AM  Result Value Ref Range   Sodium 138 135 - 145 mEq/L   Potassium 4.5 3.5 - 5.1 mEq/L   Chloride 104 96 - 112 mEq/L   CO2 29 19 - 32 mEq/L   Glucose, Bld 90 70 - 99 mg/dL   BUN 11 6 - 23 mg/dL   Creatinine, Ser 0.68  0.40 - 1.20 mg/dL   Total Bilirubin 0.5 0.2 - 1.2 mg/dL   Alkaline Phosphatase 41 39 - 117 U/L   AST 13 0 - 37 U/L   ALT 12 0 - 35 U/L   Total Protein 7.3 6.0 - 8.3 g/dL   Albumin 3.1 (L) 3.5 - 5.2 g/dL   Calcium 9.5 8.4 - 10.5 mg/dL   GFR 95.88 >60.00 mL/min  CBC with Differential/Platelet     Status: None   Collection Time: 07/25/17  1:03 PM  Result Value Ref Range   WBC 6.7 3.8 - 10.8 Thousand/uL   RBC 4.33 3.80 - 5.10 Million/uL   Hemoglobin 13.3 11.7 - 15.5 g/dL   HCT 39.3 35.0 - 45.0 %   MCV 90.8 80.0 - 100.0 fL   MCH 30.7 27.0 - 33.0 pg   MCHC 33.8 32.0 - 36.0 g/dL   RDW 14.1 11.0 - 15.0 %   Platelets 264 140 - 400 Thousand/uL   MPV 11.3 7.5 - 12.5 fL   Neutro Abs 3,524 1,500 - 7,800 cells/uL   Lymphs Abs 2,613 850 - 3,900 cells/uL   WBC mixed population 523 200 - 950 cells/uL   Eosinophils Absolute 20 15 - 500 cells/uL   Basophils Absolute 20 0 - 200 cells/uL   Neutrophils Relative % 52.6 %   Total Lymphocyte 39.0 %   Monocytes Relative 7.8 %   Eosinophils Relative 0.3 %   Basophils Relative 0.3 %   Objective  Body mass index is 37.13 kg/m. Wt Readings from Last 3 Encounters:  07/23/17 209 lb 9.6 oz (95.1 kg)  10/03/16 220 lb 8 oz (100 kg)  08/16/16 233 lb (105.7 kg)   Temp Readings from Last 3 Encounters:  07/23/17 98.1 F (36.7 C) (Oral)  05/02/17 97.7 F (36.5 C) (Oral)  10/03/16 97.5 F (36.4 C) (Oral)   BP Readings from Last 3 Encounters:  07/23/17 (!) 160/90  05/02/17 120/80  10/03/16 136/88   Pulse Readings from Last 3 Encounters:  07/23/17 71  05/02/17 87  10/03/16  83   O2 sat room air 95%  Physical Exam  Constitutional: She is oriented to person, place, and time and well-developed, well-nourished, and in no distress.  HENT:  Head: Normocephalic and atraumatic.  Mouth/Throat: Oropharynx is clear and moist and mucous membranes are normal.  Eyes: Conjunctivae are normal. Pupils are equal, round, and reactive to light.  Cardiovascular:  Normal rate, regular rhythm and normal heart sounds.  Pulmonary/Chest: Effort normal. She has wheezes.  Abdominal: Soft. Bowel sounds are normal.  Neurological: She is alert and oriented to person, place, and time. Gait normal. Gait normal.  Skin: Skin is warm, dry and intact.  Psychiatric: Mood, memory, affect and judgment normal.  Nursing note and vitals reviewed.   Assessment   1. Asthma/bronchitis uncontrolled pt noncompliant with symbicort still smoking  2. HTN uncontrolled no meds today  3. Allergies  4. Hypothyroidism uncontrolled  5. S/p gastric bypass  6. Anxiety/depression controlled for now with Dr. Gretel Acre  7. C/w menopause sx's  8.HM Plan  1.  Resume symbicort encouraged compliance  Prn albuterol  rec smoking cessation  Consider repeat CT chest age 52 y.o smoking x 30 years 1 ppd  2. rec take Lis 20 mg qd has not been taking  3. Refilled zyrtec and singulair   4.  Check TSH today Synthyroid on 200 mcg qd will determine dose after lab  5. Check B12 low will rec 1000 mcg orally qd , vit D  6.  Fu psych Dr. Gretel Acre  7. Check FSH today  8.  Flu shot given 2  Days after visit at lab visit  pna 23 had 03/2012 due at f/u  Tdap UTD  rec hep vaccine   Check labs CMET, CBC, lipid, A1C, UA, TSH, FSH, vit D, B12 hep B/C Friday   mammo due 10/2017 referred  Pap due 03/2018  EGD/colonoscopy had [facility] , need to get report colonoscopy 03/26/14     Provider: Dr. Olivia Mackie McLean-Scocuzza-Internal Medicine

## 2017-08-04 ENCOUNTER — Ambulatory Visit: Payer: 59 | Admitting: Psychiatry

## 2017-08-18 ENCOUNTER — Encounter: Payer: Self-pay | Admitting: Psychiatry

## 2017-08-18 ENCOUNTER — Ambulatory Visit: Payer: 59 | Admitting: Psychiatry

## 2017-08-18 VITALS — BP 159/90 | HR 88 | Ht 63.0 in | Wt 211.2 lb

## 2017-08-18 DIAGNOSIS — F5081 Binge eating disorder: Secondary | ICD-10-CM

## 2017-08-18 DIAGNOSIS — F331 Major depressive disorder, recurrent, moderate: Secondary | ICD-10-CM

## 2017-08-18 MED ORDER — TRAZODONE HCL 100 MG PO TABS: 100.0000 mg | ORAL_TABLET | Freq: Every day | ORAL | 2 refills | Status: DC

## 2017-08-18 MED ORDER — TOPIRAMATE 100 MG PO TABS: 100.0000 mg | ORAL_TABLET | Freq: Every day | ORAL | 2 refills | Status: DC

## 2017-08-18 MED ORDER — DULOXETINE HCL 60 MG PO CPEP: 60.0000 mg | ORAL_CAPSULE | Freq: Every day | ORAL | 2 refills | Status: DC

## 2017-08-18 NOTE — Progress Notes (Signed)
BH MD/PA/NP OP Progress Note  08/18/2017 3:35 PM Paula Garrett  MRN:  [id number]  Subjective:  Patient is a 52 year old female who presented for the follow-up appointment.She reported that she is currently having laryngitis and is having problems with speaking. Patient reported that she has been trying to lose weight and has lost approximately 60 pounds. She has been exercising and trying to eat less. Patient reported that she has been taking Topamax as well. She is not having any side effects the medication. We discussed about her medications in detail. She appeared compliant with her medications. Patient does not have any acute side effects of her medication. She reported that she will start exercising again as she has stopped due to the recent weather change and her laryngitis. She denied having any side effects of medication. She denied having any perceptual disturbances. She appeared receptive to her medications at this time.  .   .    Visit Diagnosis:     ICD-10-CM   1. MDD (major depressive disorder), recurrent episode, moderate (HCC) F33.1   2. Binge eating disorder F50.81     Past Medical History:  Past Medical History:  Diagnosis Date  . Anxiety   . Arthritis   . Asthma   . Depression   . GERD (gastroesophageal reflux disease)   . Hypertension   . Hypothyroidism   . Pneumonia   . Thyroid disease     Past Surgical History:  Procedure Laterality Date  . ABLATION  2006  . BREAST BIOPSY Left 1998  . BREAST SURGERY     cyst aspiration  . BREAST SURGERY  1998   cyst aspiration  . COLONOSCOPY  03/2014  . ENDOMETRIAL ABLATION    . ESOPHAGOGASTRODUODENOSCOPY (EGD) WITH PROPOFOL N/A 08/15/2015   Procedure: ESOPHAGOGASTRODUODENOSCOPY (EGD) WITH PROPOFOL;  Surgeon: Lollie Sails, MD;  Location: [facility] ENDOSCOPY;  Service: Endoscopy;  Laterality: N/A;  . ESOPHAGOGASTRODUODENOSCOPY (EGD) WITH PROPOFOL N/A 01/22/2016   Procedure: ESOPHAGOGASTRODUODENOSCOPY (EGD) WITH PROPOFOL;   Surgeon: Lollie Sails, MD;  Location: [facility] ENDOSCOPY;  Service: Endoscopy;  Laterality: N/A;  . GASTRIC BYPASS  2005  . HERNIA REPAIR    . NASAL SINUS SURGERY    . TOTAL THYROIDECTOMY    . TUBAL LIGATION     Family History:  Family History  Problem Relation Age of Onset  . Heart disease Mother   . COPD Mother   . Arthritis Mother   . Cancer Mother        uterine  . Lupus Mother   . Anxiety disorder Mother   . Depression Mother   . Drug abuse Mother   . COPD Father   . Arthritis Father   . Heart disease Father   . Heart attack Father   . Alcohol abuse Father   . Heart disease Brother   . Heart attack Brother   . Drug abuse Brother   . Anxiety disorder Brother   . Depression Brother   . Heart disease Brother   . Diabetes Maternal Grandmother   . Diabetes Maternal Grandfather   . Diabetes Paternal Grandmother   . Diabetes Paternal Grandfather    Social History:  Social History   Socioeconomic History  . Marital status: Divorced    Spouse name: None  . Number of children: 2  . Years of education: None  . Highest education level: High school graduate  Social Needs  . Financial resource strain: Not hard at all  . Food insecurity - worry: Never  true  . Food insecurity - inability: Never true  . Transportation needs - medical: No  . Transportation needs - non-medical: No  Occupational History  . None  Tobacco Use  . Smoking status: Current Every Day Smoker    Packs/day: 1.00    Years: 30.00    Pack years: 30.00    Types: Cigarettes    Start date: 03/15/1980  . Smokeless tobacco: Never Used  Substance and Sexual Activity  . Alcohol use: Yes    Alcohol/week: 6.0 - 7.2 oz    Types: 8 - 10 Glasses of wine, 2 Standard drinks or equivalent per week  . Drug use: No  . Sexual activity: Yes    Partners: Male  Other Topics Concern  . None  Social History Narrative   Lives in [place] with husband.      Work - [employer]      Diet - healthy diet    Exercise - limited   Caffeine use: daily   Additional History:   Assessment:   Musculoskeletal: Strength & Muscle Tone: within normal limits Gait & Station: normal Patient leans: N/A  Psychiatric Specialty Exam: Medication Refill   Anxiety  Patient reports no insomnia, nervous/anxious behavior or suicidal ideas.      Review of Systems  Musculoskeletal: Negative for joint pain.  Psychiatric/Behavioral: Negative for depression, hallucinations, memory loss, substance abuse and suicidal ideas. The patient is not nervous/anxious and does not have insomnia.   All other systems reviewed and are negative.   Blood pressure (!) 159/90, pulse 88, height 5\' 3"  (1.6 m), weight 211 lb 3.2 oz (95.8 kg).Body mass index is 37.41 kg/m.  General Appearance: Well Groomed  Eye Contact:  Good  Speech:  Normal Rate  Volume:  Normal  Mood:  Anxious and Depressed  Affect:  Depressed and Tearful  Thought Process:  Linear  Orientation:  Full (Time, Place, and Person)  Thought Content:  WDL  Suicidal Thoughts:  No  Homicidal Thoughts:  No  Memory:  Immediate;   Good Recent;   Good Remote;   Good  Judgement:  Good  Insight:  Good  Psychomotor Activity:  Normal  Concentration:  Good  Recall:  Good  Fund of Knowledge: Good  Language: Good  Akathisia:  Negative  Handed:   AIMS (if indicated):  Done 05/16/15 and normal  Assets:  Communication Skills Desire for Improvement Social Support Talents/Skills  ADL's:  Intact  Cognition: WNL  Sleep:  good   Is the patient at risk to self?  No. Has the patient been a risk to self in the past 6 months?  No. Has the patient been a risk to self within the distant past?  No. Is the patient a risk to others?  No. Has the patient been a risk to others in the past 6 months?  No. Has the patient been a risk to others within the distant past?  No.  Current Medications: Current Outpatient Medications  Medication Sig Dispense Refill  . albuterol  (PROVENTIL HFA;VENTOLIN HFA) 108 (90 Base) MCG/ACT inhaler Inhale 2 puffs into the lungs every 6 (six) hours as needed for wheezing or shortness of breath. 1 Inhaler 11  . budesonide-formoterol (SYMBICORT) 80-4.5 MCG/ACT inhaler Inhale 2 puffs into the lungs 2 (two) times daily. Rinse mouth 1 Inhaler 11  . cetirizine (ZYRTEC) 10 MG tablet Take 1 tablet (10 mg total) by mouth daily. 90 tablet 0  . Cholecalciferol (VITAMIN D) 2000 units CAPS Take by mouth.    Marland Kitchen  Cholecalciferol 50000 units capsule Take 1 capsule (50,000 Units total) by mouth once a week. 13 capsule 1  . DULoxetine (CYMBALTA) 60 MG capsule Take 1 capsule (60 mg total) by mouth daily. 30 capsule 2  . lisinopril (PRINIVIL,ZESTRIL) 20 MG tablet Take 1 tablet (20 mg total) by mouth daily. 90 tablet 0  . montelukast (SINGULAIR) 10 MG tablet Take 1 tablet (10 mg total) by mouth at bedtime. 90 tablet 3  . pantoprazole (PROTONIX) 40 MG tablet Take 1 tablet (40 mg total) by mouth daily. 90 tablet 3  . SYNTHROID 200 MCG tablet TAKE 1 TABLET BY MOUTH ONCE daily before breakfast 30 minutes 90 tablet 0  . topiramate (TOPAMAX) 100 MG tablet Take 1 tablet (100 mg total) by mouth daily. 30 tablet 2  . traZODone (DESYREL) 100 MG tablet Take 1 tablet (100 mg total) by mouth at bedtime. 30 tablet 2  . cefdinir (OMNICEF) 300 MG capsule Take 1 capsule (300 mg total) 2 (two) times daily by mouth. 20 capsule 0   No current facility-administered medications for this visit.     Medical Decision Making:  Established Problem, Stable/Improving (1), Review of Medication Regimen & Side Effects (2) and Review of New Medication or Change in Dosage (2)  Treatment Plan Summary:Medication management and Plan   Continue Cymbalta 60 mg by mouth daily Continue Topamax 100 mg by mouth daily at bedtime Continue  trazodone 100 mg by mouth daily at bedtime  Follow-up in 2 months or earlier depending on her symptoms     More than 50% of the time spent in  psychoeducation, counseling and coordination of care.    This note was generated in part or whole with voice recognition software. Voice regonition is usually quite accurate but there are transcription errors that can and very often do occur. I apologize for any typographical errors that were not detected and corrected.   Rainey Pines, MD  08/18/2017, 3:35 PM

## 2017-08-19 ENCOUNTER — Encounter: Payer: Self-pay | Admitting: Internal Medicine

## 2017-08-19 ENCOUNTER — Ambulatory Visit: Payer: Managed Care, Other (non HMO) | Admitting: Internal Medicine

## 2017-08-19 VITALS — BP 140/90 | HR 80 | Temp 98.0°F | Ht 63.0 in | Wt 209.8 lb

## 2017-08-19 DIAGNOSIS — E559 Vitamin D deficiency, unspecified: Secondary | ICD-10-CM | POA: Diagnosis not present

## 2017-08-19 DIAGNOSIS — E890 Postprocedural hypothyroidism: Secondary | ICD-10-CM | POA: Diagnosis not present

## 2017-08-19 DIAGNOSIS — E538 Deficiency of other specified B group vitamins: Secondary | ICD-10-CM | POA: Diagnosis not present

## 2017-08-19 DIAGNOSIS — J4521 Mild intermittent asthma with (acute) exacerbation: Secondary | ICD-10-CM | POA: Diagnosis not present

## 2017-08-19 DIAGNOSIS — J040 Acute laryngitis: Secondary | ICD-10-CM | POA: Diagnosis not present

## 2017-08-19 DIAGNOSIS — H9203 Otalgia, bilateral: Secondary | ICD-10-CM | POA: Diagnosis not present

## 2017-08-19 DIAGNOSIS — J029 Acute pharyngitis, unspecified: Secondary | ICD-10-CM | POA: Diagnosis not present

## 2017-08-19 MED ORDER — NEOMYCIN-POLYMYXIN-HC 3.5-10000-1 OT SOLN: 4.0000 [drp] | Freq: Four times a day (QID) | OTIC | 0 refills | Status: DC

## 2017-08-19 MED ORDER — AMOXICILLIN-POT CLAVULANATE 875-125 MG PO TABS: 1.0000 | ORAL_TABLET | Freq: Two times a day (BID) | ORAL | 0 refills | Status: DC

## 2017-08-19 MED ORDER — PREDNISONE 20 MG PO TABS: ORAL_TABLET | ORAL | 0 refills | Status: DC

## 2017-08-19 NOTE — Patient Instructions (Addendum)
Please let me know by the end of the week if you are not better  I will refer you to a thyroid specialist for your thyroid being uncontrolled-[facility]  1 week as needed otherwise 10/2017 with me  Results for Paula Garrett, Paula Garrett (MRN [id number]) as of 08/19/2017 16:08  Ref. Range 07/25/2017 08:23 07/25/2017 13:03  COMPREHENSIVE METABOLIC PANEL Unknown Rpt (A)   Sodium Latest Ref Range: 135 - 145 mEq/L 138   Potassium Latest Ref Range: 3.5 - 5.1 mEq/L 4.5   Chloride Latest Ref Range: 96 - 112 mEq/L 104   CO2 Latest Ref Range: 19 - 32 mEq/L 29   Glucose Latest Ref Range: 70 - 99 mg/dL 90   BUN Latest Ref Range: 6 - 23 mg/dL 11   Creatinine Latest Ref Range: 0.40 - 1.20 mg/dL 0.68   Calcium Latest Ref Range: 8.4 - 10.5 mg/dL 9.5   Alkaline Phosphatase Latest Ref Range: 39 - 117 U/L 41   Albumin Latest Ref Range: 3.5 - 5.2 g/dL 3.1 (L)   AST Latest Ref Range: 0 - 37 U/L 13   ALT Latest Ref Range: 0 - 35 U/L 12   Total Protein Latest Ref Range: 6.0 - 8.3 g/dL 7.3   Total Bilirubin Latest Ref Range: 0.2 - 1.2 mg/dL 0.5   GFR Latest Ref Range: >60.00 mL/min 95.88   Total CHOL/HDL Ratio Unknown 2   Cholesterol Latest Ref Range: 0 - 200 mg/dL 188   HDL Cholesterol Latest Ref Range: >39.00 mg/dL 75.80   LDL (calc) Latest Ref Range: 0 - 99 mg/dL 93   NonHDL Unknown 112.30   Triglycerides Latest Ref Range: 0.0 - 149.0 mg/dL 95.0   VLDL Latest Ref Range: 0.0 - 40.0 mg/dL 19.0   VITD Latest Ref Range: 30.00 - 100.00 ng/mL 18.49 (L)   Vitamin B12 Latest Ref Range: 211 - 911 pg/mL 137 (L)   WBC Latest Ref Range: 3.8 - 10.8 Thousand/uL  6.7  WBC mixed population Latest Ref Range: 200 - 950 cells/uL  523  RBC Latest Ref Range: 3.80 - 5.10 Million/uL  4.33  Hemoglobin Latest Ref Range: 11.7 - 15.5 g/dL  13.3  HCT Latest Ref Range: 35.0 - 45.0 %  39.3  MCV Latest Ref Range: 80.0 - 100.0 fL  90.8  MCH Latest Ref Range: 27.0 - 33.0 pg  30.7  MCHC Latest Ref Range: 32.0 - 36.0 g/dL  33.8  RDW  Latest Ref Range: 11.0 - 15.0 %  14.1  Platelets Latest Ref Range: 140 - 400 Thousand/uL  264  MPV Latest Ref Range: 7.5 - 12.5 fL  11.3  Neutrophils Latest Units: %  52.6  Monocytes Relative Latest Units: %  7.8  Eosinophil Latest Units: %  0.3  Basophil Latest Units: %  0.3  NEUT# Latest Ref Range: 1,500 - 7,800 cells/uL  3,524  Lymphocyte # Latest Ref Range: 850 - 3,900 cells/uL  2,613  Total Lymphocyte Latest Units: %  39.0  Eosinophils Absolute Latest Ref Range: 15 - 500 cells/uL  20  Basophils Absolute Latest Ref Range: 0 - 200 cells/uL  20  FSH Latest Units: mIU/ML 21.0   Hemoglobin A1C Latest Ref Range: 4.6 - 6.5 % 5.7   TSH Latest Ref Range: 0.35 - 4.50 uIU/mL 21.14 (H)   Hepatitis B-Post Latest Ref Range: > OR = 10 mIU/mL <5 (L)   Hepatitis C Ab Latest Ref Range: NON-REACTI  NON-REACTIVE   SIGNAL TO CUT-OFF Latest Ref Range: <1.00  0.00  URINALYSIS, ROUTINE W REFLEX MICROSCOPIC Unknown Rpt (A)   Appearance Latest Ref Range: Clear  CLEAR   Bilirubin Urine Latest Ref Range: Negative  NEGATIVE   Color, Urine Latest Ref Range: Yellow;Lt. Yellow  YELLOW   Hgb urine dipstick Latest Ref Range: Negative  NEGATIVE   Ketones, ur Latest Ref Range: Negative  NEGATIVE   Leukocytes, UA Latest Ref Range: Negative  TRACE (A)   Nitrite Latest Ref Range: Negative  NEGATIVE   pH Latest Ref Range: 5.0 - 8.0  6.0   Specific Gravity, Urine Latest Ref Range: 1.000 - 1.030  1.015   Urine Glucose Latest Ref Range: Negative  NEGATIVE   Urobilinogen, UA Latest Ref Range: 0.0 - 1.0  0.2

## 2017-08-19 NOTE — Progress Notes (Signed)
Pre visit review using our clinic review tool, if applicable. No additional management support is needed unless otherwise documented below in the visit note. 

## 2017-08-20 ENCOUNTER — Encounter: Payer: Self-pay | Admitting: Internal Medicine

## 2017-08-20 NOTE — Progress Notes (Signed)
Chief Complaint  Patient presents with  . Sore Throat    loss of voice   Sick visit  1. C/o no voice today and yesterday and sick for 2 days. One of co works had flu yesterday though she has not felt like flu sx's. She has been wheezing, sob, cough with white thick sputum, felt warm but not checked temp and sore throat x 2 weeks. She tried warm drinks and mouth wash w/o relief. She also c/o b/l ear pain  2. H/o asthma and allergies with allergy shots in the past.  3. Reviewed prior labs she is taking B12 1000 mcg otc and 50K vit D3.  4. Hypothyroidism s/p thyroidectomy she reports years ago she had hyperthyroidism. She is not missing doses of synthryroid 200 mcg and takes qam w/o other meds but TSH is 21.14.      Review of Systems  Constitutional: Positive for fever.  HENT: Positive for ear pain and sore throat.        +hoarseness   Eyes: Negative for blurred vision.  Respiratory: Positive for cough, sputum production, shortness of breath and wheezing.   Cardiovascular: Negative for chest pain.  Skin: Negative for rash.   Past Medical History:  Diagnosis Date  . Anxiety   . Arthritis   . Asthma   . Depression   . GERD (gastroesophageal reflux disease)   . Hypertension   . Hypothyroidism   . Pneumonia   . Thyroid disease    Past Surgical History:  Procedure Laterality Date  . ABLATION  2006  . BREAST BIOPSY Left 1998  . BREAST SURGERY     cyst aspiration  . BREAST SURGERY  1998   cyst aspiration  . COLONOSCOPY  03/2014  . ENDOMETRIAL ABLATION    . ESOPHAGOGASTRODUODENOSCOPY (EGD) WITH PROPOFOL N/A 08/15/2015   Procedure: ESOPHAGOGASTRODUODENOSCOPY (EGD) WITH PROPOFOL;  Surgeon: Lollie Sails, MD;  Location: [facility] ENDOSCOPY;  Service: Endoscopy;  Laterality: N/A;  . ESOPHAGOGASTRODUODENOSCOPY (EGD) WITH PROPOFOL N/A 01/22/2016   Procedure: ESOPHAGOGASTRODUODENOSCOPY (EGD) WITH PROPOFOL;  Surgeon: Lollie Sails, MD;  Location: [facility] ENDOSCOPY;  Service: Endoscopy;   Laterality: N/A;  . GASTRIC BYPASS  2005  . HERNIA REPAIR    . NASAL SINUS SURGERY    . TOTAL THYROIDECTOMY    . TUBAL LIGATION     Family History  Problem Relation Age of Onset  . Heart disease Mother   . COPD Mother   . Arthritis Mother   . Cancer Mother        uterine  . Lupus Mother   . Anxiety disorder Mother   . Depression Mother   . Drug abuse Mother   . COPD Father   . Arthritis Father   . Heart disease Father   . Heart attack Father   . Alcohol abuse Father   . Heart disease Brother   . Heart attack Brother   . Drug abuse Brother   . Anxiety disorder Brother   . Depression Brother   . Heart disease Brother   . Diabetes Maternal Grandmother   . Diabetes Maternal Grandfather   . Diabetes Paternal Grandmother   . Diabetes Paternal Grandfather    Social History   Socioeconomic History  . Marital status: Divorced    Spouse name: Not on file  . Number of children: 2  . Years of education: Not on file  . Highest education level: High school graduate  Social Needs  . Financial resource strain: Not hard at all  .  Food insecurity - worry: Never true  . Food insecurity - inability: Never true  . Transportation needs - medical: No  . Transportation needs - non-medical: No  Occupational History  . Not on file  Tobacco Use  . Smoking status: Current Every Day Smoker    Packs/day: 1.00    Years: 30.00    Pack years: 30.00    Types: Cigarettes    Start date: 03/15/1980  . Smokeless tobacco: Never Used  Substance and Sexual Activity  . Alcohol use: Yes    Alcohol/week: 6.0 - 7.2 oz    Types: 8 - 10 Glasses of wine, 2 Standard drinks or equivalent per week  . Drug use: No  . Sexual activity: Yes    Partners: Male  Other Topics Concern  . Not on file  Social History Narrative   Lives in [place] with husband.      Work - [employer]      Diet - healthy diet   Exercise - limited   Caffeine use: daily   Current Meds  Medication Sig  . albuterol  (PROVENTIL HFA;VENTOLIN HFA) 108 (90 Base) MCG/ACT inhaler Inhale 2 puffs into the lungs every 6 (six) hours as needed for wheezing or shortness of breath.  . budesonide-formoterol (SYMBICORT) 80-4.5 MCG/ACT inhaler Inhale 2 puffs into the lungs 2 (two) times daily. Rinse mouth  . cefdinir (OMNICEF) 300 MG capsule Take 1 capsule (300 mg total) 2 (two) times daily by mouth.  . cetirizine (ZYRTEC) 10 MG tablet Take 1 tablet (10 mg total) by mouth daily.  . Cholecalciferol (VITAMIN D) 2000 units CAPS Take by mouth.  . Cholecalciferol 50000 units capsule Take 1 capsule (50,000 Units total) by mouth once a week.  . DULoxetine (CYMBALTA) 60 MG capsule Take 1 capsule (60 mg total) by mouth daily.  Marland Kitchen lisinopril (PRINIVIL,ZESTRIL) 20 MG tablet Take 1 tablet (20 mg total) by mouth daily.  . montelukast (SINGULAIR) 10 MG tablet Take 1 tablet (10 mg total) by mouth at bedtime.  . pantoprazole (PROTONIX) 40 MG tablet Take 1 tablet (40 mg total) by mouth daily.  Marland Kitchen SYNTHROID 200 MCG tablet TAKE 1 TABLET BY MOUTH ONCE daily before breakfast 30 minutes  . topiramate (TOPAMAX) 100 MG tablet Take 1 tablet (100 mg total) by mouth daily.  . traZODone (DESYREL) 100 MG tablet Take 1 tablet (100 mg total) by mouth at bedtime.   No Known Allergies Recent Results (from the past 2160 hour(s))  Hepatitis C antibody     Status: None   Collection Time: 07/25/17  8:23 AM  Result Value Ref Range   Hepatitis C Ab NON-REACTIVE NON-REACTI   SIGNAL TO CUT-OFF 0.00 <1.00  Hepatitis B surface antibody     Status: Abnormal   Collection Time: 07/25/17  8:23 AM  Result Value Ref Range   Hepatitis B-Post <5 (L) > OR = 10 mIU/mL    Comment: . Patient does not have immunity to hepatitis B virus. . For additional information, please refer to http://education.[facility].com/faq/FAQ105 (This link is being provided for informational/ educational purposes only).   [place]     Status: None   Collection Time: 07/25/17  8:23 AM   Result Value Ref Range   FSH 21.0 mIU/ML    Comment: Female Reference Range:  1.4-18.1 mIU/mLFemale Reference Range:Follicular Phase          2.5-10.2 mIU/mLMidCycle Peak          3.4-33.4 mIU/mLLuteal Phase  1.5-9.1 mIU/mLPost Menopausal     23.0-116.3 mIU/mLPregnant          <0.3 mIU/mL  B12     Status: Abnormal   Collection Time: 07/25/17  8:23 AM  Result Value Ref Range   Vitamin B-12 137 (L) 211 - 911 pg/mL  Vitamin D (25 hydroxy)     Status: Abnormal   Collection Time: 07/25/17  8:23 AM  Result Value Ref Range   VITD 18.49 (L) 30.00 - 100.00 ng/mL  TSH     Status: Abnormal   Collection Time: 07/25/17  8:23 AM  Result Value Ref Range   TSH 21.14 (H) 0.35 - 4.50 uIU/mL  Urinalysis, Routine w reflex microscopic     Status: Abnormal   Collection Time: 07/25/17  8:23 AM  Result Value Ref Range   Color, Urine YELLOW Yellow;Lt. Yellow   APPearance CLEAR Clear   Specific Gravity, Urine 1.015 1.000 - 1.030   pH 6.0 5.0 - 8.0   Total Protein, Urine NEGATIVE Negative   Urine Glucose NEGATIVE Negative   Ketones, ur NEGATIVE Negative   Bilirubin Urine NEGATIVE Negative   Hgb urine dipstick NEGATIVE Negative   Urobilinogen, UA 0.2 0.0 - 1.0   Leukocytes, UA TRACE (A) Negative   Nitrite NEGATIVE Negative   WBC, UA 3-6/hpf (A) 0-2/hpf   RBC / HPF none seen 0-2/hpf   Squamous Epithelial / LPF Rare(0-4/hpf) Rare(0-4/hpf)   Bacteria, UA Few(10-50/hpf) (A) None  Hemoglobin A1c     Status: None   Collection Time: 07/25/17  8:23 AM  Result Value Ref Range   Hgb A1c MFr Bld 5.7 4.6 - 6.5 %    Comment: Glycemic Control Guidelines for People with Diabetes:Non Diabetic:  <6%Goal of Therapy: <7%Additional Action Suggested:  >8%   Lipid panel     Status: None   Collection Time: 07/25/17  8:23 AM  Result Value Ref Range   Cholesterol 188 0 - 200 mg/dL    Comment: ATP III Classification       Desirable:  < 200 mg/dL               Borderline High:  200 - 239 mg/dL          High:  > = 240  mg/dL   Triglycerides 95.0 0.0 - 149.0 mg/dL    Comment: Normal:  <150 mg/dLBorderline High:  150 - 199 mg/dL   HDL 75.80 >39.00 mg/dL   VLDL 19.0 0.0 - 40.0 mg/dL   LDL Cholesterol 93 0 - 99 mg/dL   Total CHOL/HDL Ratio 2     Comment:                Men          Women1/2 Average Risk     3.4          3.3Average Risk          5.0          4.42X Average Risk          9.6          7.13X Average Risk          15.0          11.0                       NonHDL 112.30     Comment: NOTE:  Non-HDL goal should be 30 mg/dL higher than patient's LDL goal (i.e. LDL goal of <  70 mg/dL, would have non-HDL goal of < 100 mg/dL)  Comprehensive metabolic panel     Status: Abnormal   Collection Time: 07/25/17  8:23 AM  Result Value Ref Range   Sodium 138 135 - 145 mEq/L   Potassium 4.5 3.5 - 5.1 mEq/L   Chloride 104 96 - 112 mEq/L   CO2 29 19 - 32 mEq/L   Glucose, Bld 90 70 - 99 mg/dL   BUN 11 6 - 23 mg/dL   Creatinine, Ser 0.68 0.40 - 1.20 mg/dL   Total Bilirubin 0.5 0.2 - 1.2 mg/dL   Alkaline Phosphatase 41 39 - 117 U/L   AST 13 0 - 37 U/L   ALT 12 0 - 35 U/L   Total Protein 7.3 6.0 - 8.3 g/dL   Albumin 3.1 (L) 3.5 - 5.2 g/dL   Calcium 9.5 8.4 - 10.5 mg/dL   GFR 95.88 >60.00 mL/min  CBC with Differential/Platelet     Status: None   Collection Time: 07/25/17  1:03 PM  Result Value Ref Range   WBC 6.7 3.8 - 10.8 Thousand/uL   RBC 4.33 3.80 - 5.10 Million/uL   Hemoglobin 13.3 11.7 - 15.5 g/dL   HCT 39.3 35.0 - 45.0 %   MCV 90.8 80.0 - 100.0 fL   MCH 30.7 27.0 - 33.0 pg   MCHC 33.8 32.0 - 36.0 g/dL   RDW 14.1 11.0 - 15.0 %   Platelets 264 140 - 400 Thousand/uL   MPV 11.3 7.5 - 12.5 fL   Neutro Abs 3,524 1,500 - 7,800 cells/uL   Lymphs Abs 2,613 850 - 3,900 cells/uL   WBC mixed population 523 200 - 950 cells/uL   Eosinophils Absolute 20 15 - 500 cells/uL   Basophils Absolute 20 0 - 200 cells/uL   Neutrophils Relative % 52.6 %   Total Lymphocyte 39.0 %   Monocytes Relative 7.8 %   Eosinophils  Relative 0.3 %   Basophils Relative 0.3 %   Objective  Body mass index is 37.16 kg/m. Wt Readings from Last 3 Encounters:  08/19/17 209 lb 12.8 oz (95.2 kg)  07/23/17 209 lb 9.6 oz (95.1 kg)  10/03/16 220 lb 8 oz (100 kg)   Temp Readings from Last 3 Encounters:  08/19/17 98 F (36.7 C) (Oral)  07/23/17 98.1 F (36.7 C) (Oral)  05/02/17 97.7 F (36.5 C) (Oral)   BP Readings from Last 3 Encounters:  08/19/17 140/90  07/23/17 (!) 160/90  05/02/17 120/80   Pulse Readings from Last 3 Encounters:  08/19/17 80  07/23/17 71  05/02/17 87   O2 sat room air 93%  Physical Exam  Constitutional: She is oriented to person, place, and time and well-developed, well-nourished, and in no distress. Vital signs are normal.  HENT:  Head: Normocephalic and atraumatic.  Mouth/Throat: Mucous membranes are normal. Posterior oropharyngeal erythema present.  Redness in b/l ears  +hoarse voice   Eyes: Conjunctivae are normal. Pupils are equal, round, and reactive to light.  Cardiovascular: Normal rate, regular rhythm and normal heart sounds.  Pulmonary/Chest: Effort normal. She has wheezes.  Mild wheezing b/l   Neurological: She is alert and oriented to person, place, and time. Gait normal. Gait normal.  Skin: Skin is warm, dry and intact.  Psychiatric: Mood, memory, affect and judgment normal.  Nursing note and vitals reviewed.   Assessment   1. Sore throat  2. Laryngitis  3. Asthma exacerbation  4. C/w otitis media with b/l ear pain and redness  5.  Hypothyroidism s/p thyroidectomy uncontrolled TSH 21.14. With h/o hyperthyroidism  6. Vit B12 and D def  7. HM Plan  1. Supportive care  2. See above warm gargles with salt, warm tea, lozengers  Note to be off work until next week  3. Prednisone x 10days, 4 puffs bid symbicort while sick then back to 2 p bid, prn albuterol Needs smoking cessation  4. Neomycin ear drops b/l, augmentin bid x 7 dayts  5. Refer to endocrine on synthryoid 200  mcg qam and TSH 21.14  6. On b12 1000 mcg qd, 50K vit D for now  7.  Flu shot had pna 23 had 03/2012 due at f/u  Tdap UTD  rec hep B vaccine  mammo due 10/2017 referred  Pap due 03/2018  EGD/colonoscopy had [facility] , need to get report colonoscopy 03/26/14   Provider: Dr. Olivia Mackie McLean-Scocuzza-Internal Medicine

## 2017-09-22 ENCOUNTER — Ambulatory Visit: Payer: Self-pay | Admitting: Internal Medicine

## 2017-10-13 ENCOUNTER — Ambulatory Visit: Payer: 59 | Admitting: Psychiatry

## 2017-10-23 ENCOUNTER — Ambulatory Visit: Payer: 59 | Admitting: Psychiatry

## 2017-10-27 ENCOUNTER — Ambulatory Visit: Payer: Self-pay | Admitting: Family Medicine

## 2017-10-27 VITALS — BP 163/91 | HR 74 | Temp 98.3°F | Resp 16 | Ht 63.0 in | Wt 208.0 lb

## 2017-10-27 DIAGNOSIS — Z008 Encounter for other general examination: Secondary | ICD-10-CM

## 2017-10-27 DIAGNOSIS — Z0189 Encounter for other specified special examinations: Principal | ICD-10-CM

## 2017-10-27 LAB — GLUCOSE, POCT (MANUAL RESULT ENTRY): POC Glucose: 92 mg/dl (ref 70–99)

## 2017-10-27 NOTE — Progress Notes (Signed)
Subjective: Annual biometrics screening  Patient presents for her annual biometric screening. Patient reports that she regularly sees her primary care provider and eats a healthy diet.  Patient reports she does not regularly exercise but that she intends to start going to the gym again regularly. PCP: Dr. Terese Door. Patient works for the [employer]. Patient denies any other issues or concerns.   Review of Systems Unremarkable  Objective  Physical Exam General: Awake, alert and oriented. No acute distress. Well developed, hydrated and nourished. Appears stated age.  HEENT: Supple neck without adenopathy. Sclera is non-icteric. The ear canal is clear without discharge. The tympanic membrane is normal in appearance with normal landmarks and cone of light. Nasal mucosa is pink and moist. Oral mucosa is pink and moist. The pharynx is normal in appearance without tonsillar swelling or exudates.  Skin: Skin in warm, dry and intact without rashes or lesions. Appropriate color for ethnicity. Cardiac: Heart rate and rhythm are normal. No murmurs, gallops, or rubs are auscultated.  Respiratory: The chest wall is symmetric and without deformity. No signs of respiratory distress. Lung sounds are clear in all lobes bilaterally without rales, ronchi, or wheezes.  Neurological: The patient is awake, alert and oriented to person, place, and time with normal speech.  Memory is normal and thought processes intact. No gait abnormalities are appreciated.  Psychiatric: Appropriate mood and affect.   Assessment Annual biometrics screening  Plan  Lipid panel pending. Encouraged routine visits with primary care provider.  Fasting blood sugar is 92 today. Patient's blood pressure is 163/91 today.  Patient reports she has not taken her blood pressure medication for 2 days.  Advised patient to take her blood pressure medication as prescribed and regularly monitor her blood pressure at home.  Discussed normal  values and instructed patient to report abnormal values to her primary care provider. Encouraged patient to get regular exercise and eat a healthy, well-rounded diet.

## 2017-10-28 LAB — LIPID PANEL
CHOLESTEROL TOTAL: 206 mg/dL — AB (ref 100–199)
Chol/HDL Ratio: 2.2 ratio (ref 0.0–4.4)
HDL: 92 mg/dL (ref >39)
LDL Calculated: 74 mg/dL (ref 0–99)
TRIGLYCERIDES: 198 mg/dL — AB (ref 0–149)
VLDL Cholesterol Cal: 40 mg/dL (ref 5–40)

## 2017-10-29 NOTE — Progress Notes (Signed)
Dear Ms. Penni Homans, I wanted to let you know that your lipid panel came back.  Everything is normal, with the exception of your total cholesterol and triglyceride level.  Your total cholesterol is elevated at 206, normal values are between 100 and 199.  Your triglyceride level is elevated at 198, normal values are between 0 and 149.  I want you to follow-up with your primary care provider regarding these results.

## 2017-11-10 ENCOUNTER — Other Ambulatory Visit: Payer: Self-pay | Admitting: Family Medicine

## 2017-11-20 ENCOUNTER — Other Ambulatory Visit (HOSPITAL_COMMUNITY)
Admission: RE | Admit: 2017-11-20 | Discharge: 2017-11-20 | Disposition: A | Discharge status: Home / Self Care | Payer: Managed Care, Other (non HMO) | Source: Ambulatory Visit | Attending: Internal Medicine | Admitting: Internal Medicine

## 2017-11-20 ENCOUNTER — Encounter: Payer: Self-pay | Admitting: Internal Medicine

## 2017-11-20 ENCOUNTER — Ambulatory Visit (INDEPENDENT_AMBULATORY_CARE_PROVIDER_SITE_OTHER): Payer: Managed Care, Other (non HMO) | Admitting: Internal Medicine

## 2017-11-20 VITALS — BP 148/80 | HR 87 | Temp 98.3°F | Ht 63.0 in | Wt 211.8 lb

## 2017-11-20 DIAGNOSIS — Z113 Encounter for screening for infections with a predominantly sexual mode of transmission: Secondary | ICD-10-CM

## 2017-11-20 DIAGNOSIS — G4700 Insomnia, unspecified: Secondary | ICD-10-CM | POA: Diagnosis not present

## 2017-11-20 DIAGNOSIS — F329 Major depressive disorder, single episode, unspecified: Secondary | ICD-10-CM

## 2017-11-20 DIAGNOSIS — E039 Hypothyroidism, unspecified: Secondary | ICD-10-CM

## 2017-11-20 DIAGNOSIS — Z1151 Encounter for screening for human papillomavirus (HPV): Secondary | ICD-10-CM | POA: Insufficient documentation

## 2017-11-20 DIAGNOSIS — I10 Essential (primary) hypertension: Secondary | ICD-10-CM | POA: Diagnosis not present

## 2017-11-20 DIAGNOSIS — R232 Flushing: Secondary | ICD-10-CM

## 2017-11-20 DIAGNOSIS — F419 Anxiety disorder, unspecified: Secondary | ICD-10-CM | POA: Diagnosis not present

## 2017-11-20 DIAGNOSIS — Z124 Encounter for screening for malignant neoplasm of cervix: Secondary | ICD-10-CM

## 2017-11-20 DIAGNOSIS — J309 Allergic rhinitis, unspecified: Secondary | ICD-10-CM | POA: Insufficient documentation

## 2017-11-20 DIAGNOSIS — E785 Hyperlipidemia, unspecified: Secondary | ICD-10-CM

## 2017-11-20 MED ORDER — LISINOPRIL-HYDROCHLOROTHIAZIDE 20-12.5 MG PO TABS
1.0000 | ORAL_TABLET | Freq: Every day | ORAL | 1 refills | Status: DC
Start: 2017-11-20 — End: 2018-05-12

## 2017-11-20 MED ORDER — CETIRIZINE HCL 10 MG PO TABS: 10.0000 mg | ORAL_TABLET | Freq: Every day | ORAL | 3 refills | Status: DC

## 2017-11-20 MED ORDER — VENLAFAXINE HCL ER 75 MG PO CP24: 75.0000 mg | ORAL_CAPSULE | Freq: Every day | ORAL | 1 refills | Status: DC

## 2017-11-20 MED ORDER — SYNTHROID 200 MCG PO TABS: ORAL_TABLET | ORAL | 0 refills | Status: DC

## 2017-11-20 MED ORDER — TEMAZEPAM 15 MG PO CAPS: 15.0000 mg | ORAL_CAPSULE | Freq: Every evening | ORAL | 1 refills | Status: DC | PRN

## 2017-11-20 NOTE — Patient Instructions (Signed)
Follow up in 6 weeks  Please schedule your mammogram  Take care   Hypertension Hypertension, commonly called high blood pressure, is when the force of blood pumping through the arteries is too strong. The arteries are the blood vessels that carry blood from the heart throughout the body. Hypertension forces the heart to work harder to pump blood and may cause arteries to become narrow or stiff. Having untreated or uncontrolled hypertension can cause heart attacks, strokes, kidney disease, and other problems. A blood pressure reading consists of a higher number over a lower number. Ideally, your blood pressure should be below 120/80. The first ("top") number is called the systolic pressure. It is a measure of the pressure in your arteries as your heart beats. The second ("bottom") number is called the diastolic pressure. It is a measure of the pressure in your arteries as the heart relaxes. What are the causes? The cause of this condition is not known. What increases the risk? Some risk factors for high blood pressure are under your control. Others are not. Factors you can change  Smoking.  Having type 2 diabetes mellitus, high cholesterol, or both.  Not getting enough exercise or physical activity.  Being overweight.  Having too much fat, sugar, calories, or salt (sodium) in your diet.  Drinking too much alcohol. Factors that are difficult or impossible to change  Having chronic kidney disease.  Having a family history of high blood pressure.  Age. Risk increases with age.  Race. You may be at higher risk if you are African-American.  Gender. Men are at higher risk than women before age 2. After age 23, women are at higher risk than men.  Having obstructive sleep apnea.  Stress. What are the signs or symptoms? Extremely high blood pressure (hypertensive crisis) may cause:  Headache.  Anxiety.  Shortness of breath.  Nosebleed.  Nausea and vomiting.  Severe chest  pain.  Jerky movements you cannot control (seizures).  How is this diagnosed? This condition is diagnosed by measuring your blood pressure while you are seated, with your arm resting on a surface. The cuff of the blood pressure monitor will be placed directly against the skin of your upper arm at the level of your heart. It should be measured at least twice using the same arm. Certain conditions can cause a difference in blood pressure between your right and left arms. Certain factors can cause blood pressure readings to be lower or higher than normal (elevated) for a short period of time:  When your blood pressure is higher when you are in a health care provider's office than when you are at home, this is called white coat hypertension. Most people with this condition do not need medicines.  When your blood pressure is higher at home than when you are in a health care provider's office, this is called masked hypertension. Most people with this condition may need medicines to control blood pressure.  If you have a high blood pressure reading during one visit or you have normal blood pressure with other risk factors:  You may be asked to return on a different day to have your blood pressure checked again.  You may be asked to monitor your blood pressure at home for 1 week or longer.  If you are diagnosed with hypertension, you may have other blood or imaging tests to help your health care provider understand your overall risk for other conditions. How is this treated? This condition is treated by making healthy  lifestyle changes, such as eating healthy foods, exercising more, and reducing your alcohol intake. Your health care provider may prescribe medicine if lifestyle changes are not enough to get your blood pressure under control, and if:  Your systolic blood pressure is above 130.  Your diastolic blood pressure is above 80.  Your personal target blood pressure may vary depending on your  medical conditions, your age, and other factors. Follow these instructions at home: Eating and drinking  Eat a diet that is high in fiber and potassium, and low in sodium, added sugar, and fat. An example eating plan is called the DASH (Dietary Approaches to Stop Hypertension) diet. To eat this way: ? Eat plenty of fresh fruits and vegetables. Try to fill half of your plate at each meal with fruits and vegetables. ? Eat whole grains, such as whole wheat pasta, brown rice, or whole grain bread. Fill about one quarter of your plate with whole grains. ? Eat or drink low-fat dairy products, such as skim milk or low-fat yogurt. ? Avoid fatty cuts of meat, processed or cured meats, and poultry with skin. Fill about one quarter of your plate with lean proteins, such as fish, chicken without skin, beans, eggs, and tofu. ? Avoid premade and processed foods. These tend to be higher in sodium, added sugar, and fat.  Reduce your daily sodium intake. Most people with hypertension should eat less than 1,500 mg of sodium a day.  Limit alcohol intake to no more than 1 drink a day for nonpregnant women and 2 drinks a day for men. One drink equals 12 oz of beer, 5 oz of wine, or 1 oz of hard liquor. Lifestyle  Work with your health care provider to maintain a healthy body weight or to lose weight. Ask what an ideal weight is for you.  Get at least 30 minutes of exercise that causes your heart to beat faster (aerobic exercise) most days of the week. Activities may include walking, swimming, or biking.  Include exercise to strengthen your muscles (resistance exercise), such as pilates or lifting weights, as part of your weekly exercise routine. Try to do these types of exercises for 30 minutes at least 3 days a week.  Do not use any products that contain nicotine or tobacco, such as cigarettes and e-cigarettes. If you need help quitting, ask your health care provider.  Monitor your blood pressure at home as  told by your health care provider.  Keep all follow-up visits as told by your health care provider. This is important. Medicines  Take over-the-counter and prescription medicines only as told by your health care provider. Follow directions carefully. Blood pressure medicines must be taken as prescribed.  Do not skip doses of blood pressure medicine. Doing this puts you at risk for problems and can make the medicine less effective.  Ask your health care provider about side effects or reactions to medicines that you should watch for. Contact a health care provider if:  You think you are having a reaction to a medicine you are taking.  You have headaches that keep coming back (recurring).  You feel dizzy.  You have swelling in your ankles.  You have trouble with your vision. Get help right away if:  You develop a severe headache or confusion.  You have unusual weakness or numbness.  You feel faint.  You have severe pain in your chest or abdomen.  You vomit repeatedly.  You have trouble breathing. Summary  Hypertension is when the  force of blood pumping through your arteries is too strong. If this condition is not controlled, it may put you at risk for serious complications.  Your personal target blood pressure may vary depending on your medical conditions, your age, and other factors. For most people, a normal blood pressure is less than 120/80.  Hypertension is treated with lifestyle changes, medicines, or a combination of both. Lifestyle changes include weight loss, eating a healthy, low-sodium diet, exercising more, and limiting alcohol. This information is not intended to replace advice given to you by your health care provider. Make sure you discuss any questions you have with your health care provider. Document Released: 06/03/2005 Document Revised: 05/01/2016 Document Reviewed: 05/01/2016 Elsevier Interactive Patient Education  [employer].

## 2017-11-20 NOTE — Progress Notes (Addendum)
Chief Complaint  Patient presents with  . Gynecologic Exam   F/u and wants pap  1. HTN uncontrolled on lis 20  2. HLD pt is not exercising  3. Hypothyroidism TSH >21 07/2017 has f/u 12/10/17 endocrine she is compliant with meds  4. Anxiety and depression stopped cymbalta 60 mg, topomax 100 mg, trazadone 100 mg qhs and does not want to see Dr. Gretel Acre again    Review of Systems  Constitutional: Negative for weight loss.  HENT: Negative for hearing loss.   Eyes: Negative for blurred vision.  Respiratory: Negative for shortness of breath.   Cardiovascular: Negative for chest pain.  Genitourinary:       +hot flashes   Skin: Negative for rash.  Neurological: Negative for headaches.  Psychiatric/Behavioral: Positive for depression. The patient is nervous/anxious and has insomnia.    Past Medical History:  Diagnosis Date  . Anxiety   . Arthritis   . Asthma   . Depression   . GERD (gastroesophageal reflux disease)   . Hypertension   . Hypothyroidism   . Pneumonia   . Thyroid disease    Past Surgical History:  Procedure Laterality Date  . ABLATION  2006  . BREAST BIOPSY Left 1998  . BREAST SURGERY     cyst aspiration  . BREAST SURGERY  1998   cyst aspiration  . COLONOSCOPY  03/2014  . ENDOMETRIAL ABLATION    . ESOPHAGOGASTRODUODENOSCOPY (EGD) WITH PROPOFOL N/A 08/15/2015   Procedure: ESOPHAGOGASTRODUODENOSCOPY (EGD) WITH PROPOFOL;  Surgeon: Lollie Sails, MD;  Location: [facility] ENDOSCOPY;  Service: Endoscopy;  Laterality: N/A;  . ESOPHAGOGASTRODUODENOSCOPY (EGD) WITH PROPOFOL N/A 01/22/2016   Procedure: ESOPHAGOGASTRODUODENOSCOPY (EGD) WITH PROPOFOL;  Surgeon: Lollie Sails, MD;  Location: [facility] ENDOSCOPY;  Service: Endoscopy;  Laterality: N/A;  . GASTRIC BYPASS  2005  . HERNIA REPAIR    . NASAL SINUS SURGERY    . TOTAL THYROIDECTOMY    . TUBAL LIGATION     Family History  Problem Relation Age of Onset  . Heart disease Mother   . COPD Mother   . Arthritis Mother   .  Cancer Mother        uterine  . Lupus Mother   . Anxiety disorder Mother   . Depression Mother   . Drug abuse Mother   . COPD Father   . Arthritis Father   . Heart disease Father   . Heart attack Father   . Alcohol abuse Father   . Heart disease Brother   . Heart attack Brother   . Drug abuse Brother   . Anxiety disorder Brother   . Depression Brother   . Heart disease Brother   . Diabetes Maternal Grandmother   . Diabetes Maternal Grandfather   . Diabetes Paternal Grandmother   . Diabetes Paternal Grandfather    Social History   Socioeconomic History  . Marital status: Divorced    Spouse name: Not on file  . Number of children: 2  . Years of education: Not on file  . Highest education level: High school graduate  Occupational History  . Not on file  Social Needs  . Financial resource strain: Not hard at all  . Food insecurity:    Worry: Never true    Inability: Never true  . Transportation needs:    Medical: No    Non-medical: No  Tobacco Use  . Smoking status: Current Every Day Smoker    Packs/day: 1.00    Years: 30.00  Pack years: 30.00    Types: Cigarettes    Start date: 03/15/1980  . Smokeless tobacco: Never Used  Substance and Sexual Activity  . Alcohol use: Yes    Alcohol/week: 6.0 - 7.2 oz    Types: 8 - 10 Glasses of wine, 2 Standard drinks or equivalent per week  . Drug use: No  . Sexual activity: Yes    Partners: Male  Lifestyle  . Physical activity:    Days per week: 4 days    Minutes per session: 40 min  . Stress: Very much  Relationships  . Social connections:    Talks on phone: Three times a week    Gets together: Once a week    Attends religious service: Never    Active member of club or organization: No    Attends meetings of clubs or organizations: Never    Relationship status: Divorced  . Intimate partner violence:    Fear of current or ex partner: No    Emotionally abused: No    Physically abused: No    Forced sexual  activity: No  Other Topics Concern  . Not on file  Social History Narrative   Lives in [place] with husband.      Work - [employer]      Diet - healthy diet   Exercise - limited   Caffeine use: daily   Current Meds  Medication Sig  . albuterol (PROVENTIL HFA;VENTOLIN HFA) 108 (90 Base) MCG/ACT inhaler Inhale 2 puffs into the lungs every 6 (six) hours as needed for wheezing or shortness of breath.  . budesonide-formoterol (SYMBICORT) 80-4.5 MCG/ACT inhaler Inhale 2 puffs into the lungs 2 (two) times daily. Rinse mouth  . cetirizine (ZYRTEC) 10 MG tablet Take 1 tablet (10 mg total) by mouth daily.  . Cholecalciferol (VITAMIN D) 2000 units CAPS Take by mouth.  . Cholecalciferol 50000 units capsule Take 1 capsule (50,000 Units total) by mouth once a week.  . montelukast (SINGULAIR) 10 MG tablet Take 1 tablet (10 mg total) by mouth at bedtime.  . pantoprazole (PROTONIX) 40 MG tablet TAKE 1 TABLET BY MOUTH ONCE DAILY  . SYNTHROID 200 MCG tablet TAKE 1 TABLET BY MOUTH ONCE daily before breakfast 30 minutes  . [DISCONTINUED] cetirizine (ZYRTEC) 10 MG tablet Take 1 tablet (10 mg total) by mouth daily.  . [DISCONTINUED] lisinopril (PRINIVIL,ZESTRIL) 20 MG tablet Take 1 tablet (20 mg total) by mouth daily.  . [DISCONTINUED] SYNTHROID 200 MCG tablet TAKE 1 TABLET BY MOUTH ONCE daily before breakfast 30 minutes   No Known Allergies Recent Results (from the past 2160 hour(s))  Lipid panel     Status: Abnormal   Collection Time: 10/27/17 10:03 AM  Result Value Ref Range   Cholesterol, Total 206 (H) 100 - 199 mg/dL   Triglycerides 198 (H) 0 - 149 mg/dL   HDL 92 >39 mg/dL   VLDL Cholesterol Cal 40 5 - 40 mg/dL   LDL Calculated 74 0 - 99 mg/dL   Chol/HDL Ratio 2.2 0.0 - 4.4 ratio    Comment:                                   T. Chol/HDL Ratio  Men  Women                               1/2 Avg.Risk  3.4    3.3                                    Avg.Risk  5.0    4.4                                2X Avg.Risk  9.6    7.1                                3X Avg.Risk 23.4   11.0   POCT glucose (manual entry)     Status: None   Collection Time: 10/27/17 10:14 AM  Result Value Ref Range   POC Glucose 92 70 - 99 mg/dl   Objective  Body mass index is 37.52 kg/m. Wt Readings from Last 3 Encounters:  11/20/17 211 lb 12.8 oz (96.1 kg)  10/27/17 208 lb (94.3 kg)  08/19/17 209 lb 12.8 oz (95.2 kg)   Temp Readings from Last 3 Encounters:  11/20/17 98.3 F (36.8 C) (Oral)  10/27/17 98.3 F (36.8 C) (Oral)  08/19/17 98 F (36.7 C) (Oral)   BP Readings from Last 3 Encounters:  11/20/17 (!) 148/80  10/27/17 (!) 163/91  08/19/17 140/90   Pulse Readings from Last 3 Encounters:  11/20/17 87  10/27/17 74  08/19/17 80    Physical Exam  Constitutional: She is oriented to person, place, and time. Vital signs are normal. She appears well-developed and well-nourished. She is cooperative.  HENT:  Head: Normocephalic and atraumatic.  Mouth/Throat: Oropharynx is clear and moist and mucous membranes are normal.  Eyes: Pupils are equal, round, and reactive to light. Conjunctivae are normal.  Cardiovascular: Normal rate, regular rhythm and normal heart sounds.  Pulmonary/Chest: Effort normal and breath sounds normal. Right breast exhibits no inverted nipple, no mass, no nipple discharge, no skin change and no tenderness. Left breast exhibits inverted nipple. Left breast exhibits no mass, no nipple discharge, no skin change and no tenderness. No breast swelling, tenderness, discharge or bleeding. Breasts are symmetrical.    Genitourinary: Vagina normal and uterus normal. Pelvic exam was performed with patient supine. Cervix exhibits no motion tenderness, no discharge and no friability.  Neurological: She is alert and oriented to person, place, and time. Gait normal.  Skin: Skin is warm, dry and intact.  Psychiatric: She has a normal mood and  affect. Her speech is normal and behavior is normal. Judgment and thought content normal. Cognition and memory are normal.  Nursing note and vitals reviewed.   Assessment   1. HTN/HLD uncontrolled  2. Hypothyroidism uncontrolled  3. Hot flashes  4. Anxiety and depression and insomnia 5. HM  Plan   1. Lis-hct 20-12.5 x 1 week 1 pill then increase to 2 pills  Given cholesterol handout  2. F/u endocrine 12/10/17  Refilled med temp supply  3. Add back effexor 75 xl qd  4. See #3 consider titrate up to 150 mg qd  Add restoril 15--30 mg qhs prn  5.  Flu shot had pna 23 had 03/2012 due at f/u in future  Tdap UTD  rec hep B vaccinefor future  mammo due 10/2017 referred pt needs to sch  Pap done today with breast exam  EGD/colonoscopy had [facility] , need to get report colonoscopy 03/26/14 rec smoking cesasation    Provider: Dr. Olivia Mackie McLean-Scocuzza-Internal Medicine

## 2017-11-24 LAB — CYTOLOGY - PAP
BACTERIAL VAGINITIS: NEGATIVE
CANDIDA VAGINITIS: NEGATIVE
Chlamydia: NEGATIVE
Diagnosis: NEGATIVE
HPV (WINDOPATH): NOT DETECTED
Neisseria Gonorrhea: NEGATIVE
Trichomonas: NEGATIVE

## 2017-12-08 DIAGNOSIS — Z9884 Bariatric surgery status: Secondary | ICD-10-CM | POA: Insufficient documentation

## 2017-12-31 ENCOUNTER — Telehealth: Payer: Self-pay | Admitting: Internal Medicine

## 2017-12-31 NOTE — Telephone Encounter (Signed)
mychart message has been sent to patient to ask question.

## 2017-12-31 NOTE — Telephone Encounter (Signed)
Call patient did she see Endocrine on 12/08/17?  They need to refill thyroid medication  Paula Garrett

## 2018-01-02 ENCOUNTER — Other Ambulatory Visit: Payer: Self-pay | Admitting: Internal Medicine

## 2018-01-02 DIAGNOSIS — E039 Hypothyroidism, unspecified: Secondary | ICD-10-CM

## 2018-01-02 MED ORDER — SYNTHROID 200 MCG PO TABS: ORAL_TABLET | ORAL | 0 refills | Status: DC

## 2018-01-09 ENCOUNTER — Other Ambulatory Visit: Payer: Self-pay

## 2018-01-09 ENCOUNTER — Ambulatory Visit: Payer: Managed Care, Other (non HMO) | Admitting: Internal Medicine

## 2018-01-09 ENCOUNTER — Encounter: Payer: Self-pay | Admitting: Internal Medicine

## 2018-01-09 VITALS — BP 132/74 | HR 108 | Temp 98.6°F | Ht 63.0 in | Wt 210.2 lb

## 2018-01-09 DIAGNOSIS — G4700 Insomnia, unspecified: Secondary | ICD-10-CM | POA: Diagnosis not present

## 2018-01-09 DIAGNOSIS — K22719 Barrett's esophagus with dysplasia, unspecified: Secondary | ICD-10-CM

## 2018-01-09 DIAGNOSIS — J4520 Mild intermittent asthma, uncomplicated: Secondary | ICD-10-CM

## 2018-01-09 DIAGNOSIS — E559 Vitamin D deficiency, unspecified: Secondary | ICD-10-CM

## 2018-01-09 DIAGNOSIS — E785 Hyperlipidemia, unspecified: Secondary | ICD-10-CM

## 2018-01-09 DIAGNOSIS — J029 Acute pharyngitis, unspecified: Secondary | ICD-10-CM

## 2018-01-09 DIAGNOSIS — I10 Essential (primary) hypertension: Secondary | ICD-10-CM

## 2018-01-09 DIAGNOSIS — E538 Deficiency of other specified B group vitamins: Secondary | ICD-10-CM

## 2018-01-09 DIAGNOSIS — G4733 Obstructive sleep apnea (adult) (pediatric): Secondary | ICD-10-CM

## 2018-01-09 MED ORDER — TEMAZEPAM 15 MG PO CAPS: 15.0000 mg | ORAL_CAPSULE | Freq: Every evening | ORAL | 2 refills | Status: DC | PRN

## 2018-01-09 MED ORDER — AZITHROMYCIN 250 MG PO TABS: ORAL_TABLET | ORAL | 0 refills | Status: DC

## 2018-01-09 NOTE — Progress Notes (Addendum)
Chief Complaint  Patient presents with  . Follow-up   F/u  1. HTN controlled on lis-hctz 20-12.5 2 pills qd  2. Hypothyroidism on cytomel 5 mg qd and levo 200 mcg last TSH 4.185 normal Ft4 1.07, Ft3 2.4  3. H/o OSA years ago before gastric bypass not on cpap consider cpap in future she does snore at night 4. Sore throat and cough new x 1 week nothing tried.  Review of Systems  Constitutional: Negative for weight loss.  HENT: Positive for sore throat. Negative for hearing loss.   Eyes: Negative for blurred vision.  Respiratory: Positive for cough.   Cardiovascular: Negative for chest pain.  Skin: Negative for rash.  Psychiatric/Behavioral: Negative for depression.   Past Medical History:  Diagnosis Date  . Anxiety   . Arthritis   . Asthma   . Depression   . GERD (gastroesophageal reflux disease)   . Hypertension   . Hypothyroidism   . Pneumonia   . Thyroid disease    Past Surgical History:  Procedure Laterality Date  . ABLATION  2006  . BREAST BIOPSY Left 1998  . BREAST SURGERY     cyst aspiration  . BREAST SURGERY  1998   cyst aspiration  . COLONOSCOPY  03/2014  . ENDOMETRIAL ABLATION    . ESOPHAGOGASTRODUODENOSCOPY (EGD) WITH PROPOFOL N/A 08/15/2015   Procedure: ESOPHAGOGASTRODUODENOSCOPY (EGD) WITH PROPOFOL;  Surgeon: Lollie Sails, MD;  Location: [facility] ENDOSCOPY;  Service: Endoscopy;  Laterality: N/A;  . ESOPHAGOGASTRODUODENOSCOPY (EGD) WITH PROPOFOL N/A 01/22/2016   Procedure: ESOPHAGOGASTRODUODENOSCOPY (EGD) WITH PROPOFOL;  Surgeon: Lollie Sails, MD;  Location: [facility] ENDOSCOPY;  Service: Endoscopy;  Laterality: N/A;  . GASTRIC BYPASS  2005  . HERNIA REPAIR    . NASAL SINUS SURGERY    . TOTAL THYROIDECTOMY    . TUBAL LIGATION     Family History  Problem Relation Age of Onset  . Heart disease Mother   . COPD Mother   . Arthritis Mother   . Cancer Mother        uterine  . Lupus Mother   . Anxiety disorder Mother   . Depression Mother   . Drug abuse  Mother   . COPD Father   . Arthritis Father   . Heart disease Father   . Heart attack Father   . Alcohol abuse Father   . Heart disease Brother   . Heart attack Brother   . Drug abuse Brother   . Anxiety disorder Brother   . Depression Brother   . Heart disease Brother   . Diabetes Maternal Grandmother   . Diabetes Maternal Grandfather   . Diabetes Paternal Grandmother   . Diabetes Paternal Grandfather    Social History   Socioeconomic History  . Marital status: Divorced    Spouse name: Not on file  . Number of children: 2  . Years of education: Not on file  . Highest education level: High school graduate  Occupational History  . Not on file  Social Needs  . Financial resource strain: Not hard at all  . Food insecurity:    Worry: Never true    Inability: Never true  . Transportation needs:    Medical: No    Non-medical: No  Tobacco Use  . Smoking status: Current Every Day Smoker    Packs/day: 1.00    Years: 30.00    Pack years: 30.00    Types: Cigarettes    Start date: 03/15/1980  . Smokeless tobacco: Never Used  Substance and Sexual Activity  . Alcohol use: Yes    Alcohol/week: 6.0 - 7.2 oz    Types: 8 - 10 Glasses of wine, 2 Standard drinks or equivalent per week  . Drug use: No  . Sexual activity: Yes    Partners: Male  Lifestyle  . Physical activity:    Days per week: 4 days    Minutes per session: 40 min  . Stress: Very much  Relationships  . Social connections:    Talks on phone: Three times a week    Gets together: Once a week    Attends religious service: Never    Active member of club or organization: No    Attends meetings of clubs or organizations: Never    Relationship status: Divorced  . Intimate partner violence:    Fear of current or ex partner: No    Emotionally abused: No    Physically abused: No    Forced sexual activity: No  Other Topics Concern  . Not on file  Social History Narrative   Lives in [place] with husband.       Work - [employer]      Diet - healthy diet   Exercise - limited   Caffeine use: daily   Current Meds  Medication Sig  . albuterol (PROVENTIL HFA;VENTOLIN HFA) 108 (90 Base) MCG/ACT inhaler Inhale 2 puffs into the lungs every 6 (six) hours as needed for wheezing or shortness of breath.  . budesonide-formoterol (SYMBICORT) 80-4.5 MCG/ACT inhaler Inhale 2 puffs into the lungs 2 (two) times daily. Rinse mouth  . cetirizine (ZYRTEC) 10 MG tablet Take 1 tablet (10 mg total) by mouth daily.  . Cholecalciferol (VITAMIN D) 2000 units CAPS Take by mouth.  . Cholecalciferol 50000 units capsule Take 1 capsule (50,000 Units total) by mouth once a week.  Marland Kitchen liothyronine (CYTOMEL) 5 MCG tablet Take 5 mcg by mouth daily.  Marland Kitchen lisinopril-hydrochlorothiazide (ZESTORETIC) 20-12.5 MG tablet Take 1 tablet by mouth daily. X 1 week in the  am then 2 pills daily in am  . montelukast (SINGULAIR) 10 MG tablet Take 1 tablet (10 mg total) by mouth at bedtime.  . pantoprazole (PROTONIX) 40 MG tablet TAKE 1 TABLET BY MOUTH ONCE DAILY  . SYNTHROID 200 MCG tablet TAKE 1 TABLET BY MOUTH ONCE daily before breakfast 30 minutes  . temazepam (RESTORIL) 15 MG capsule Take 1-2 capsules (15-30 mg total) by mouth at bedtime as needed for sleep.  Marland Kitchen venlafaxine XR (EFFEXOR XR) 75 MG 24 hr capsule Take 1 capsule (75 mg total) by mouth daily with breakfast.   No Known Allergies Recent Results (from the past 2160 hour(s))  Lipid panel     Status: Abnormal   Collection Time: 10/27/17 10:03 AM  Result Value Ref Range   Cholesterol, Total 206 (H) 100 - 199 mg/dL   Triglycerides 198 (H) 0 - 149 mg/dL   HDL 92 >39 mg/dL   VLDL Cholesterol Cal 40 5 - 40 mg/dL   LDL Calculated 74 0 - 99 mg/dL   Chol/HDL Ratio 2.2 0.0 - 4.4 ratio    Comment:                                   T. Chol/HDL Ratio  Men  Women                               1/2 Avg.Risk  3.4    3.3                                    Avg.Risk  5.0    4.4                                2X Avg.Risk  9.6    7.1                                3X Avg.Risk 23.4   11.0   POCT glucose (manual entry)     Status: None   Collection Time: 10/27/17 10:14 AM  Result Value Ref Range   POC Glucose 92 70 - 99 mg/dl  Cytology - PAP     Status: None   Collection Time: 11/20/17 12:00 AM  Result Value Ref Range   Adequacy      Satisfactory for evaluation  endocervical/transformation zone component PRESENT.   Diagnosis      NEGATIVE FOR INTRAEPITHELIAL LESIONS OR MALIGNANCY.   Bacterial vaginitis Negative for Bacterial Vaginitis Microorganisms     Comment: Normal Reference Range - Negative   Candida vaginitis Negative for Candida species     Comment: Normal Reference Range - Negative   Chlamydia Negative     Comment: Normal Reference Range - Negative   Neisseria gonorrhea Negative     Comment: Normal Reference Range - Negative   HPV NOT DETECTED     Comment: Normal Reference Range - NOT Detected   Trichomonas Negative     Comment: Normal Reference Range - Negative   Material Submitted CervicoVaginal Pap [ThinPrep Imaged]    Objective  Body mass index is 37.24 kg/m. Wt Readings from Last 3 Encounters:  01/09/18 210 lb 3.2 oz (95.3 kg)  11/20/17 211 lb 12.8 oz (96.1 kg)  10/27/17 208 lb (94.3 kg)   Temp Readings from Last 3 Encounters:  01/09/18 98.6 F (37 C) (Oral)  11/20/17 98.3 F (36.8 C) (Oral)  10/27/17 98.3 F (36.8 C) (Oral)   BP Readings from Last 3 Encounters:  01/09/18 132/74  11/20/17 (!) 148/80  10/27/17 (!) 163/91   Pulse Readings from Last 3 Encounters:  01/09/18 (!) 108  11/20/17 87  10/27/17 74    Physical Exam  Constitutional: She is oriented to person, place, and time. Vital signs are normal. She appears well-developed and well-nourished. She is cooperative.  HENT:  Head: Normocephalic and atraumatic.  Mouth/Throat: Oropharynx is clear and moist and mucous membranes are normal.  Eyes:  Pupils are equal, round, and reactive to light. Conjunctivae are normal.  Cardiovascular: Normal rate, regular rhythm and normal heart sounds.  Pulmonary/Chest: Effort normal and breath sounds normal. She has no wheezes.  Neurological: She is alert and oriented to person, place, and time. Gait normal.  Skin: Skin is warm, dry and intact.  Psychiatric: She has a normal mood and affect. Her speech is normal and behavior is normal. Judgment and thought content normal. Cognition and memory are normal.  Nursing note and vitals reviewed.   Assessment   1. HTN  2. Hypothyroidism  TSH nl 4.185  3. H/o osa not on cpap 4. HM 5. Sore throat and mild asthma exacerbation w/o wheezing Plan   1. Cont meds for now f/u 4 months  2. F/u endocrine 03/2018 reviewed labs  Levo 200, T3 5 mg qd  3. Consider sleep study in future  4.  Flu shothad pna 23 had 03/2012 due at f/u in future  Tdap UTD rec hepBvaccinefor future  Consider MMR in future   mammo due 10/2017 referred pt needs to sch  Pap neg 11/20/17 no HPV EGD/colonoscopy had [facility] , need to get report colonoscopy 03/26/14 Also EGD due 01/22/2019 barretts last 01/21/14 no dysplasia  rec smoking cesasation  5. zpack rec use inhalers and stop smoking   Provider: Dr. Olivia Mackie McLean-Scocuzza-Internal Medicine

## 2018-01-09 NOTE — Patient Instructions (Addendum)
  Vitamin D 38  T3, Free - [facility]  2.4    OTHER CHEM Component Name 12/08/2017   146 (L)  Vitamin B12   THYROID Component Name 12/08/2017 12/08/2017   4.185 TSH  T4  1.07   F/u 03/2018 Endocrine  Robitussin DM or Delsym  Vitamin D3 2000 IU total daily  Warm salt water gargles  Sore Throat A sore throat is pain, burning, irritation, or scratchiness in the throat. When you have a sore throat, you may feel pain or tenderness in your throat when you swallow or talk. Many things can cause a sore throat, including:  An infection.  Seasonal allergies.  Dryness in the air.  Irritants, such as smoke or pollution.  Gastroesophageal reflux disease (GERD).  A tumor.  A sore throat is often the first sign of another sickness. It may happen with other symptoms, such as coughing, sneezing, fever, and swollen neck glands. Most sore throats go away without medical treatment. Follow these instructions at home:  Take over-the-counter medicines only as told by your health care provider.  Drink enough fluids to keep your urine clear or pale yellow.  Rest as needed.  To help with pain, try: ? Sipping warm liquids, such as broth, herbal tea, or warm water. ? Eating or drinking cold or frozen liquids, such as frozen ice pops. ? Gargling with a salt-water mixture 3-4 times a day or as needed. To make a salt-water mixture, completely dissolve -1 tsp of salt in 1 cup of warm water. ? Sucking on hard candy or throat lozenges. ? Putting a cool-mist humidifier in your bedroom at night to moisten the air. ? Sitting in the bathroom with the door closed for 5-10 minutes while you run hot water in the shower.  Do not use any tobacco products, such as cigarettes, chewing tobacco, and e-cigarettes. If you need help quitting, ask your health care provider. Contact a health care provider if:  You have a fever for more than 2-3 days.  You have symptoms that last (are persistent) for more than  2-3 days.  Your throat does not get better within 7 days.  You have a fever and your symptoms suddenly get worse. Get help right away if:  You have difficulty breathing.  You cannot swallow fluids, soft foods, or your saliva.  You have increased swelling in your throat or neck.  You have persistent nausea and vomiting. This information is not intended to replace advice given to you by your health care provider. Make sure you discuss any questions you have with your health care provider. Document Released: 07/11/2004 Document Revised: 01/28/2016 Document Reviewed: 03/24/2015 Elsevier Interactive Patient Education  [employer].

## 2018-01-14 ENCOUNTER — Telehealth: Payer: Self-pay | Admitting: Internal Medicine

## 2018-01-14 NOTE — Telephone Encounter (Signed)
Call pharmacy and d/c cymbalta and trazdone   Thanks Eugenio Saenz

## 2018-01-14 NOTE — Telephone Encounter (Signed)
Called [facility] in [place] to get cymbalta and trazdone d/c those medications were DC'd by the pharmacist per Dr. Aundra Dubin.

## 2018-01-25 ENCOUNTER — Other Ambulatory Visit: Payer: Self-pay | Admitting: Internal Medicine

## 2018-01-25 ENCOUNTER — Telehealth: Payer: Self-pay | Admitting: Internal Medicine

## 2018-01-25 DIAGNOSIS — Z1231 Encounter for screening mammogram for malignant neoplasm of breast: Secondary | ICD-10-CM

## 2018-01-25 NOTE — Telephone Encounter (Signed)
Please have pt call and sch mammgram she is overdue  Given # [place]   Thanks [place]

## 2018-01-26 NOTE — Telephone Encounter (Signed)
mychart message has been sent to inform patient. 

## 2018-03-13 ENCOUNTER — Encounter: Payer: Self-pay | Admitting: Internal Medicine

## 2018-03-30 ENCOUNTER — Ambulatory Visit: Payer: Managed Care, Other (non HMO) | Admitting: Family Medicine

## 2018-03-30 ENCOUNTER — Encounter: Payer: Self-pay | Admitting: Family Medicine

## 2018-03-30 VITALS — BP 156/100 | HR 102 | Temp 98.3°F | Ht 62.0 in | Wt 213.6 lb

## 2018-03-30 DIAGNOSIS — Z23 Encounter for immunization: Secondary | ICD-10-CM

## 2018-03-30 DIAGNOSIS — A084 Viral intestinal infection, unspecified: Secondary | ICD-10-CM

## 2018-03-30 DIAGNOSIS — R112 Nausea with vomiting, unspecified: Secondary | ICD-10-CM

## 2018-03-30 MED ORDER — ONDANSETRON 4 MG PO TBDP: 4.0000 mg | ORAL_TABLET | Freq: Three times a day (TID) | ORAL | 1 refills | Status: DC | PRN

## 2018-03-30 NOTE — Progress Notes (Signed)
ll

## 2018-03-30 NOTE — Progress Notes (Signed)
Subjective:    Patient ID: Paula Garrett, female    DOB: [birth year], 52 y.o.   MRN: [id number]  HPI   Patient presents to clinic due to nausea, vomiting and upset stomach for 1 day.  States she was throwing up multiple times yesterday, but stomach seems better this morning.  States a GI bug has been going around at work.  Denies any fever or chills.  Denies diarrhea.  Denies any specific point tenderness of abdominal pain.  Patient states she has not taken her blood pressure medicine today and yesterday due to stomach being upset.  Patient Active Problem List   Diagnosis Date Noted  . OSA (obstructive sleep apnea) 01/09/2018  . Allergic rhinitis 11/20/2017  . Hyperlipidemia 11/20/2017  . Hot flashes 11/20/2017  . Asthma 07/26/2017  . Vitamin B12 deficiency 07/26/2017  . Snoring 05/01/2016  . 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 Annual physical exam 01/12/2015  . Chronic fatigue 06/23/2014  . 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 Barrett esophagus 05/27/2014  . Morbid obesity ([place]) 01/28/2014  . Essential hypertension 01/20/2013  . Vitamin D deficiency 12/02/2012  . Tobacco abuse 06/09/2012  . Anxiety and depression 04/21/2012  . Insomnia 04/21/2012  . Hypothyroidism 04/21/2012   Social History   Tobacco Use  . Smoking status: Current Every Day Smoker    Packs/day: 1.00    Years: 30.00    Pack years: 30.00    Types: Cigarettes    Start date: 03/15/1980  . Smokeless tobacco: Never Used  Substance Use Topics  . Alcohol use: Yes    Alcohol/week: 10.0 - 12.0 standard drinks    Types: 8 - 10 Glasses of wine, 2 Standard drinks or equivalent per week   Review of Systems  Constitutional: Negative for chills, fatigue and fever.  HENT: Negative for congestion, ear pain, sinus pain and sore throat.   Eyes: Negative.   Respiratory: Negative for cough, shortness of breath and wheezing.   Cardiovascular: Negative for chest pain, palpitations and leg swelling.   Gastrointestinal: +abdominal pain, nausea and vomiting. No diarrhea. Genitourinary: Negative for dysuria, frequency and urgency.  Musculoskeletal: Negative for arthralgias and myalgias.  Skin: Negative for color change, pallor and rash.  Neurological: Negative for syncope, light-headedness and headaches.  Psychiatric/Behavioral: The patient is not nervous/anxious.       Objective:   Physical Exam   Constitutional: She appears well-developed and well-nourished. No distress.  Head: Normocephalic and atraumatic.  Eyes: EOM are normal. No scleral icterus.  Neck: Normal range of motion. Neck supple. No tracheal deviation present.  Cardiovascular: Normal rate, regular rhythm and normal heart sounds.  Pulmonary/Chest: Effort normal and breath sounds normal. No respiratory distress.  Abdominal: Soft. Bowel sounds are normal. There is no tenderness. No guarding or rebound.  Neurological: She is alert and oriented to person, place, and time. Gait normal  Skin: Skin is warm and dry. No pallor.  Psychiatric: She has a normal mood and affect. Her behavior is normal. Thought content normal.   Nursing note and vitals reviewed.    Vitals:   03/30/18 1057 03/30/18 1122  BP: (!) 170/120 (!) 156/100  Pulse: (!) 102   Temp: 98.3 F (36.8 C)   SpO2: 94%    Assessment & Plan:   Viral gastroenteritis/nausea and vomiting- patient will take Zofran as needed for any nausea or vomiting.  Advised to drink clear liquids such as water, Gatorade, ginger ale, chicken broth, Jell-O and eat only bland foods such as  crackers, white rice, plain toast, plain pasta for the next 1 to 2 days, then slowly advance diet as tolerated.  Get good rest, do good handwashing.  Patient would like flu vaccine today, she has no fever so flu vaccine given in clinic.  Keep regular follow up as planned. Return to clinic sooner if issues arise.

## 2018-03-31 ENCOUNTER — Ambulatory Visit
Admission: RE | Admit: 2018-03-31 | Discharge: 2018-03-31 | Disposition: A | Discharge status: Home / Self Care | Payer: Managed Care, Other (non HMO) | Source: Ambulatory Visit | Attending: Internal Medicine | Admitting: Internal Medicine

## 2018-03-31 DIAGNOSIS — Z1231 Encounter for screening mammogram for malignant neoplasm of breast: Secondary | ICD-10-CM | POA: Diagnosis not present

## 2018-04-08 ENCOUNTER — Other Ambulatory Visit: Payer: Self-pay | Admitting: Internal Medicine

## 2018-04-08 DIAGNOSIS — E039 Hypothyroidism, unspecified: Secondary | ICD-10-CM

## 2018-04-08 MED ORDER — SYNTHROID 200 MCG PO TABS: ORAL_TABLET | ORAL | 0 refills | Status: DC

## 2018-05-12 ENCOUNTER — Ambulatory Visit: Payer: Managed Care, Other (non HMO) | Admitting: Internal Medicine

## 2018-05-12 ENCOUNTER — Encounter: Payer: Self-pay | Admitting: Internal Medicine

## 2018-05-12 VITALS — BP 150/90 | HR 93 | Temp 98.2°F | Ht 62.0 in | Wt 219.0 lb

## 2018-05-12 DIAGNOSIS — Z0184 Encounter for antibody response examination: Secondary | ICD-10-CM

## 2018-05-12 DIAGNOSIS — E039 Hypothyroidism, unspecified: Secondary | ICD-10-CM | POA: Diagnosis not present

## 2018-05-12 DIAGNOSIS — F419 Anxiety disorder, unspecified: Secondary | ICD-10-CM

## 2018-05-12 DIAGNOSIS — Z1159 Encounter for screening for other viral diseases: Secondary | ICD-10-CM

## 2018-05-12 DIAGNOSIS — F329 Major depressive disorder, single episode, unspecified: Secondary | ICD-10-CM

## 2018-05-12 DIAGNOSIS — Z720 Tobacco use: Secondary | ICD-10-CM

## 2018-05-12 DIAGNOSIS — Z6841 Body Mass Index (BMI) 40.0 and over, adult: Secondary | ICD-10-CM

## 2018-05-12 DIAGNOSIS — E559 Vitamin D deficiency, unspecified: Secondary | ICD-10-CM

## 2018-05-12 DIAGNOSIS — E538 Deficiency of other specified B group vitamins: Secondary | ICD-10-CM

## 2018-05-12 DIAGNOSIS — I10 Essential (primary) hypertension: Secondary | ICD-10-CM

## 2018-05-12 DIAGNOSIS — G4700 Insomnia, unspecified: Secondary | ICD-10-CM

## 2018-05-12 MED ORDER — TEMAZEPAM 30 MG PO CAPS: 30.0000 mg | ORAL_CAPSULE | Freq: Every evening | ORAL | 5 refills | Status: DC | PRN

## 2018-05-12 MED ORDER — LIRAGLUTIDE -WEIGHT MANAGEMENT 18 MG/3ML ~~LOC~~ SOPN: 0.6000 mg | PEN_INJECTOR | Freq: Every day | SUBCUTANEOUS | 2 refills | Status: DC

## 2018-05-12 MED ORDER — VENLAFAXINE HCL ER 150 MG PO CP24: 150.0000 mg | ORAL_CAPSULE | Freq: Every day | ORAL | 11 refills | Status: DC

## 2018-05-12 MED ORDER — LISINOPRIL-HYDROCHLOROTHIAZIDE 20-12.5 MG PO TABS: 2.0000 | ORAL_TABLET | Freq: Every day | ORAL | 3 refills | Status: DC

## 2018-05-12 MED ORDER — CYANOCOBALAMIN 1000 MCG/ML IJ SOLN
1000.0000 ug | Freq: Once | INTRAMUSCULAR | Status: AC
  Administered 2018-05-12: 1000 ug via INTRAMUSCULAR

## 2018-05-12 MED ORDER — PEN NEEDLES 31G X 8 MM MISC: 1.0000 | Freq: Every day | 3 refills | Status: DC

## 2018-05-12 NOTE — Progress Notes (Signed)
Chief Complaint  Patient presents with  . Follow-up   F/u 1. HTN uncontrolled on zestoretic 20-12.5 mg qd she had dentist appt today and was in pain and numbing to mouth right now  2. 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 She has not been working out 2/2 work schedule 3. Hypothyroidism on synthyroid 200 mg qd and cytomel 5 mg qd  4. Vitamin b12 def agreeable to do shots at home if B12 low will give B12 shot today  5. Tobacco abuse  6. Anxiety/depression pt has been taking effexor XR 150 mg qd instead of 75 mg qd and it it helpnig  Review of Systems  Constitutional: Negative for weight loss.  HENT: Negative for hearing loss.   Eyes: Negative for blurred vision.  Respiratory: Negative for shortness of breath.   Cardiovascular: Negative for chest pain.  Gastrointestinal: Negative for abdominal pain.  Musculoskeletal: Negative for falls.  Skin: Negative for rash.  Neurological: Negative for headaches.  Psychiatric/Behavioral: Negative for depression.   Past Medical History:  Diagnosis Date  . Anxiety   . Arthritis   . Asthma   . Depression   . GERD (gastroesophageal reflux disease)   . Hypertension   . Hypothyroidism   . Pneumonia   . Thyroid disease    Past Surgical History:  Procedure Laterality Date  . ABLATION  2006  . BREAST CYST ASPIRATION Left 1998  . BREAST SURGERY     cyst aspiration  . BREAST SURGERY  1998   cyst aspiration  . COLONOSCOPY  03/2014  . ENDOMETRIAL ABLATION    . ESOPHAGOGASTRODUODENOSCOPY (EGD) WITH PROPOFOL N/A 08/15/2015   Procedure: ESOPHAGOGASTRODUODENOSCOPY (EGD) WITH PROPOFOL;  Surgeon: Lollie Sails, MD;  Location: [facility] ENDOSCOPY;  Service: Endoscopy;  Laterality: N/A;  . ESOPHAGOGASTRODUODENOSCOPY (EGD) WITH PROPOFOL N/A 01/22/2016   Procedure: ESOPHAGOGASTRODUODENOSCOPY (EGD) WITH PROPOFOL;  Surgeon: Lollie Sails, MD;  Location: [facility] ENDOSCOPY;  Service: Endoscopy;  Laterality: N/A;  . GASTRIC BYPASS  2005  .  HERNIA REPAIR    . NASAL SINUS SURGERY    . TOTAL THYROIDECTOMY    . TUBAL LIGATION     Family History  Problem Relation Age of Onset  . Heart disease Mother   . COPD Mother   . Arthritis Mother   . Cancer Mother        uterine  . Lupus Mother   . Anxiety disorder Mother   . Depression Mother   . Drug abuse Mother   . COPD Father   . Arthritis Father   . Heart disease Father   . Heart attack Father   . Alcohol abuse Father   . Heart disease Brother   . Heart attack Brother   . Drug abuse Brother   . Anxiety disorder Brother   . Depression Brother   . Heart disease Brother   . Diabetes Maternal Grandmother   . Diabetes Maternal Grandfather   . Diabetes Paternal Grandmother   . Diabetes Paternal Grandfather    Social History   Socioeconomic History  . Marital status: Divorced    Spouse name: Not on file  . Number of children: 2  . Years of education: Not on file  . Highest education level: High school graduate  Occupational History  . Not on file  Social Needs  . Financial resource strain: Not hard at all  . Food insecurity:    Worry: Never true    Inability: Never true  .  Transportation needs:    Medical: No    Non-medical: No  Tobacco Use  . Smoking status: Current Every Day Smoker    Packs/day: 1.00    Years: 30.00    Pack years: 30.00    Types: Cigarettes    Start date: 03/15/1980  . Smokeless tobacco: Never Used  Substance and Sexual Activity  . Alcohol use: Yes    Alcohol/week: 10.0 - 12.0 standard drinks    Types: 8 - 10 Glasses of wine, 2 Standard drinks or equivalent per week  . Drug use: No  . Sexual activity: Yes    Partners: Male  Lifestyle  . Physical activity:    Days per week: 4 days    Minutes per session: 40 min  . Stress: Very much  Relationships  . Social connections:    Talks on phone: Three times a week    Gets together: Once a week    Attends religious service: Never    Active member of club or organization: No    Attends  meetings of clubs or organizations: Never    Relationship status: Divorced  . Intimate partner violence:    Fear of current or ex partner: No    Emotionally abused: No    Physically abused: No    Forced sexual activity: No  Other Topics Concern  . Not on file  Social History Narrative   Lives in [place] with husband.      Work - [employer]      Diet - healthy diet   Exercise - limited   Caffeine use: daily   Current Meds  Medication Sig  . albuterol (PROVENTIL HFA;VENTOLIN HFA) 108 (90 Base) MCG/ACT inhaler Inhale 2 puffs into the lungs every 6 (six) hours as needed for wheezing or shortness of breath.  . budesonide-formoterol (SYMBICORT) 80-4.5 MCG/ACT inhaler Inhale 2 puffs into the lungs 2 (two) times daily. Rinse mouth  . cetirizine (ZYRTEC) 10 MG tablet Take 1 tablet (10 mg total) by mouth daily.  . Cholecalciferol (VITAMIN D) 2000 units CAPS Take by mouth.  . liothyronine (CYTOMEL) 5 MCG tablet Take 5 mcg by mouth daily.  Marland Kitchen lisinopril-hydrochlorothiazide (ZESTORETIC) 20-12.5 MG tablet Take 2 tablets by mouth daily.  . montelukast (SINGULAIR) 10 MG tablet Take 1 tablet (10 mg total) by mouth at bedtime.  . pantoprazole (PROTONIX) 40 MG tablet TAKE 1 TABLET BY MOUTH ONCE DAILY  . SYNTHROID 200 MCG tablet TAKE 1 TABLET BY MOUTH ONCE daily before breakfast 30 minutes  . temazepam (RESTORIL) 15 MG capsule Take 1-2 capsules (15-30 mg total) by mouth at bedtime as needed for sleep.  Marland Kitchen venlafaxine XR (EFFEXOR XR) 75 MG 24 hr capsule Take 1 capsule (75 mg total) by mouth daily with breakfast.  . [DISCONTINUED] azithromycin (ZITHROMAX) 250 MG tablet 2 pills 01/10/18 and 1 pill day 2-5  . [DISCONTINUED] Cholecalciferol 50000 units capsule Take 1 capsule (50,000 Units total) by mouth once a week.  . [DISCONTINUED] lisinopril-hydrochlorothiazide (ZESTORETIC) 20-12.5 MG tablet Take 1 tablet by mouth daily. X 1 week in the  am then 2 pills daily in am  . [DISCONTINUED] ondansetron (ZOFRAN  ODT) 4 MG disintegrating tablet Take 1 tablet (4 mg total) by mouth every 8 (eight) hours as needed for nausea or vomiting.   No Known Allergies No results found for this or any previous visit (from the past 2160 hour(s)). Objective  Body mass index is 40.06 kg/m. Wt Readings from Last 3 Encounters:  05/12/18 219 lb (  99.3 kg)  03/30/18 213 lb 9.6 oz (96.9 kg)  01/09/18 210 lb 3.2 oz (95.3 kg)   Temp Readings from Last 3 Encounters:  05/12/18 98.2 F (36.8 C) (Oral)  03/30/18 98.3 F (36.8 C) (Oral)  01/09/18 98.6 F (37 C) (Oral)   BP Readings from Last 3 Encounters:  05/12/18 (!) 150/90  03/30/18 (!) 156/100  01/09/18 132/74   Pulse Readings from Last 3 Encounters:  05/12/18 93  03/30/18 (!) 102  01/09/18 (!) 108    Physical Exam  Constitutional: She is oriented to person, place, and time. Vital signs are normal. She appears well-developed and well-nourished. She is cooperative.  HENT:  Head: Normocephalic and atraumatic.  Mouth/Throat: Oropharynx is clear and moist and mucous membranes are normal.  Eyes: Pupils are equal, round, and reactive to light. Conjunctivae are normal.  Cardiovascular: Normal rate, regular rhythm and normal heart sounds.  Pulmonary/Chest: Effort normal and breath sounds normal.  Neurological: She is alert and oriented to person, place, and time. Gait normal.  Skin: Skin is warm, dry and intact.  Psychiatric: She has a normal mood and affect. Her speech is normal and behavior is normal. Judgment and thought content normal. Cognition and memory are normal.  Nursing note and vitals reviewed.   Assessment   1. HTN uncontrolled  2. Hypothyroidism post op 3. 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 Vitamin D and b12 def  5. Tobacco abuse  6. Anxiety and depression and insomnia 7. HM Plan  1. Increase zestoretic to 40-25 mg qd  2. Cont synthyroid 200 mg qd and cytomel 5 mg qd  F/u Endocrine 07/03/2018 sch fasting labs  3. Will try to Rx saxenda failed adipex in  past  4. Check vitamin D b12 shot today  5. rec smoking cessation  6. Refilled effexor xr 150 mg qd  Refilled restoril 30 mg qhs prn  7.  Flu shothad 03/30/18  pna 21 had 03/2012 due at f/uin future Tdap UTD10/25/13 rec hepBvaccinefor future Consider MMR in future   mammo  03/31/18 neg  Pap neg 11/20/17 no HPV EGD/colonoscopy had [facility] , need to get report colonoscopy 10/10/15KC Also EGD due 01/22/2019 barretts last 01/21/14 no dysplasia  rec smoking cesasation  Provider: Dr. Olivia Mackie McLean-Scocuzza-Internal Medicine

## 2018-05-12 NOTE — Patient Instructions (Signed)
Take 2 of blood pressure pills

## 2018-05-12 NOTE — Progress Notes (Signed)
Pre visit review using our clinic review tool, if applicable. No additional management support is needed unless otherwise documented below in the visit note. 

## 2018-05-21 ENCOUNTER — Other Ambulatory Visit: Payer: Self-pay

## 2018-06-01 ENCOUNTER — Ambulatory Visit: Payer: Self-pay

## 2018-06-01 ENCOUNTER — Encounter: Payer: Self-pay | Admitting: Family Medicine

## 2018-06-01 ENCOUNTER — Ambulatory Visit: Payer: Managed Care, Other (non HMO) | Admitting: Family Medicine

## 2018-06-01 VITALS — BP 142/82 | HR 88 | Temp 97.3°F | Ht 62.0 in | Wt 214.0 lb

## 2018-06-01 DIAGNOSIS — K5900 Constipation, unspecified: Secondary | ICD-10-CM

## 2018-06-01 DIAGNOSIS — E559 Vitamin D deficiency, unspecified: Secondary | ICD-10-CM

## 2018-06-01 DIAGNOSIS — Z0184 Encounter for antibody response examination: Secondary | ICD-10-CM

## 2018-06-01 DIAGNOSIS — Z1159 Encounter for screening for other viral diseases: Secondary | ICD-10-CM

## 2018-06-01 DIAGNOSIS — R1084 Generalized abdominal pain: Secondary | ICD-10-CM

## 2018-06-01 DIAGNOSIS — N390 Urinary tract infection, site not specified: Secondary | ICD-10-CM

## 2018-06-01 DIAGNOSIS — E039 Hypothyroidism, unspecified: Secondary | ICD-10-CM

## 2018-06-01 DIAGNOSIS — E538 Deficiency of other specified B group vitamins: Secondary | ICD-10-CM

## 2018-06-01 DIAGNOSIS — I10 Essential (primary) hypertension: Secondary | ICD-10-CM | POA: Diagnosis not present

## 2018-06-01 LAB — COMPREHENSIVE METABOLIC PANEL
ALBUMIN: 3.3 g/dL — AB (ref 3.5–5.2)
ALK PHOS: 58 U/L (ref 39–117)
ALT: 13 U/L (ref 0–35)
AST: 14 U/L (ref 0–37)
BILIRUBIN TOTAL: 0.3 mg/dL (ref 0.2–1.2)
BUN: 8 mg/dL (ref 6–23)
CALCIUM: 10.2 mg/dL (ref 8.4–10.5)
CO2: 28 mEq/L (ref 19–32)
Chloride: 99 mEq/L (ref 96–112)
Creatinine, Ser: 0.67 mg/dL (ref 0.40–1.20)
GFR: 97.22 mL/min (ref >60.00)
Glucose, Bld: 111 mg/dL — ABNORMAL HIGH (ref 70–99)
POTASSIUM: 3.7 meq/L (ref 3.5–5.1)
Sodium: 138 mEq/L (ref 135–145)
TOTAL PROTEIN: 7.8 g/dL (ref 6.0–8.3)

## 2018-06-01 LAB — CBC WITH DIFFERENTIAL/PLATELET
BASOS ABS: 0 10*3/uL (ref 0.0–0.1)
Basophils Relative: 0.5 % (ref 0.0–3.0)
EOS PCT: 0.6 % (ref 0.0–5.0)
Eosinophils Absolute: 0 10*3/uL (ref 0.0–0.7)
HEMATOCRIT: 38 % (ref 36.0–46.0)
HEMOGLOBIN: 12.9 g/dL (ref 12.0–15.0)
LYMPHS ABS: 1.9 10*3/uL (ref 0.7–4.0)
LYMPHS PCT: 30.3 % (ref 12.0–46.0)
MCHC: 33.8 g/dL (ref 30.0–36.0)
MCV: 92.1 fl (ref 78.0–100.0)
MONOS PCT: 8.8 % (ref 3.0–12.0)
Monocytes Absolute: 0.6 10*3/uL (ref 0.1–1.0)
NEUTROS PCT: 59.8 % (ref 43.0–77.0)
Neutro Abs: 3.7 10*3/uL (ref 1.4–7.7)
Platelets: 356 10*3/uL (ref 150.0–400.0)
RBC: 4.13 Mil/uL (ref 3.87–5.11)
RDW: 13.7 % (ref 11.5–15.5)
WBC: 6.3 10*3/uL (ref 4.0–10.5)

## 2018-06-01 LAB — POCT URINALYSIS DIPSTICK
Bilirubin, UA: NEGATIVE
Blood, UA: NEGATIVE
Glucose, UA: NEGATIVE
Ketones, UA: NEGATIVE
Nitrite, UA: NEGATIVE
PH UA: 6 (ref 5.0–8.0)
Protein, UA: NEGATIVE
SPEC GRAV UA: 1.015 (ref 1.010–1.025)
UROBILINOGEN UA: 0.2 U/dL

## 2018-06-01 LAB — LIPID PANEL
Cholesterol: 206 mg/dL — ABNORMAL HIGH (ref 0–200)
HDL: 86.5 mg/dL (ref >39.00)
LDL Cholesterol: 99 mg/dL (ref 0–99)
NONHDL: 119.1
Total CHOL/HDL Ratio: 2
Triglycerides: 103 mg/dL (ref 0.0–149.0)
VLDL: 20.6 mg/dL (ref 0.0–40.0)

## 2018-06-01 LAB — POCT URINE PREGNANCY: Preg Test, Ur: NEGATIVE

## 2018-06-01 LAB — AMYLASE: AMYLASE: 19 U/L — AB (ref 27–131)

## 2018-06-01 LAB — VITAMIN B12: Vitamin B-12: 190 pg/mL — ABNORMAL LOW (ref 211–911)

## 2018-06-01 LAB — VITAMIN D 25 HYDROXY (VIT D DEFICIENCY, FRACTURES): VITD: 27.82 ng/mL — ABNORMAL LOW (ref 30.00–100.00)

## 2018-06-01 LAB — T4, FREE: Free T4: 0.81 ng/dL (ref 0.60–1.60)

## 2018-06-01 LAB — LIPASE: LIPASE: 8 U/L — AB (ref 11.0–59.0)

## 2018-06-01 LAB — TSH: TSH: 0.12 u[IU]/mL — ABNORMAL LOW (ref 0.35–4.50)

## 2018-06-01 NOTE — Addendum Note (Signed)
Addended by: Neta Ehlers on: 06/01/2018 10:10 AM   Modules accepted: Orders

## 2018-06-01 NOTE — Telephone Encounter (Signed)
Pt. Reports she started having abdominal pain in the middle of her abdomen 1 week ago. Thought she was constipated and took a laxative. Having watery stools now. Having low abdominal pain with radiation to her back.Feels like she has gas. No distention. Tries to eat and can only take a "couple bites and I'm full." Appointment made for today.  Reason for Disposition . [1] MODERATE pain (e.g., interferes with normal activities) AND [2] pain comes and goes (cramps) AND [3] present > 24 hours  (Exception: pain with Vomiting or Diarrhea - see that Guideline)  Answer Assessment - Initial Assessment Questions 1. LOCATION: "Where does it hurt?"       Hurts low abd. 2. RADIATION: "Does the pain shoot anywhere else?" (e.g., chest, back)     Hurts into her back 3. ONSET: "When did the pain begin?" (e.g., minutes, hours or days ago)      About 1 week ago 4. SUDDEN: "Gradual or sudden onset?"     gRADUAL 5. PATTERN "Does the pain come and go, or is it constant?"    - If constant: "Is it getting better, staying the same, or worsening?"      (Note: Constant means the pain never goes away completely; most serious pain is constant and it progresses)     - If intermittent: "How long does it last?" "Do you have pain now?"     (Note: Intermittent means the pain goes away completely between bouts)     Comes and goes 6. SEVERITY: "How bad is the pain?"  (e.g., Scale 1-10; mild, moderate, or severe)   - MILD (1-3): doesn't interfere with normal activities, abdomen soft and not tender to touch    - MODERATE (4-7): interferes with normal activities or awakens from sleep, tender to touch    - SEVERE (8-10): excruciating pain, doubled over, unable to do any normal activities      4-5 7. RECURRENT SYMPTOM: "Have you ever had this type of abdominal pain before?" If so, ask: "When was the last time?" and "What happened that time?"      Yes 8. CAUSE: "What do you think is causing the abdominal pain?"     Unsure 9.  RELIEVING/AGGRAVATING FACTORS: "What makes it better or worse?" (e.g., movement, antacids, bowel movement)     Laying helps 10. OTHER SYMPTOMS: "Has there been any vomiting, diarrhea, constipation, or urine problems?"       Did have constipation and can't eat much 11. PREGNANCY: "Is there any chance you are pregnant?" "When was your last menstrual period?"       No  Protocols used: ABDOMINAL PAIN - [facility]

## 2018-06-01 NOTE — Progress Notes (Signed)
Subjective:    Patient ID: Paula Garrett, female    DOB: 05-18-1966, 52 y.o.   MRN: [id number]  HPI  Presents to clinic c/o ABD pain off and on for the past week.  Patient describes the pain as a pressure that comes and goes on both sides of abdomen, also sometimes in the lower abdomen.  States she has not really been having great bowel movements, so has been taking MiraLAX and also did an enema and drink a bottle of mag citrate yesterday.  Patient states she did have a small bowel movement and now has been having some loose stools.  Patient denies any fever or chills.  Patient denies vomiting.  Patient denies pain with urination.  Patient also has pending lab orders from her PCP Dr. Aundra Dubin, states she was well to get these labs last week but missed her appointment.  These labs are for check of thyroid, cholesterol, MMR immunity status, vit D and b12.  Patient Active Problem List   Diagnosis Date Noted  . Morbid obesity with BMI of 40.0-44.9, adult ([place]) 05/12/2018  . OSA (obstructive sleep apnea) 01/09/2018  . Allergic rhinitis 11/20/2017  . Hyperlipidemia 11/20/2017  . Hot flashes 11/20/2017  . Asthma 07/26/2017  . B12 deficiency 07/26/2017  . Snoring 05/01/2016  . 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 Annual physical exam 01/12/2015  . Chronic fatigue 06/23/2014  . 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 Barrett esophagus 05/27/2014  . Morbid obesity ([place]) 01/28/2014  . Essential hypertension 01/20/2013  . Vitamin D deficiency 12/02/2012  . Tobacco abuse 06/09/2012  . Anxiety and depression 04/21/2012  . Insomnia 04/21/2012  . Hypothyroidism 04/21/2012   Social History   Tobacco Use  . Smoking status: Current Every Day Smoker    Packs/day: 1.00    Years: 30.00    Pack years: 30.00    Types: Cigarettes    Start date: 03/15/1980  . Smokeless tobacco: Never Used  Substance Use Topics  . Alcohol use: Yes    Alcohol/week: 10.0 - 12.0 standard drinks   Types: 8 - 10 Glasses of wine, 2 Standard drinks or equivalent per week   Review of Systems  Constitutional: Negative for chills, fatigue and fever.  HENT: Negative for congestion, ear pain, sinus pain and sore throat.   Eyes: Negative.   Respiratory: Negative for cough, shortness of breath and wheezing.   Cardiovascular: Negative for chest pain, palpitations and leg swelling.  Gastrointestinal: +abdominal pain/pressure, diarrhea & bloating. No nausea and vomiting.  Genitourinary: Negative for dysuria, frequency and urgency.  Musculoskeletal: Negative for arthralgias and myalgias.  Skin: Negative for color change, pallor and rash.  Neurological: Negative for syncope, light-headedness and headaches.  Psychiatric/Behavioral: The patient is not nervous/anxious.       Objective:   Physical Exam Vitals signs and nursing note reviewed.  Constitutional:      General: She is not in acute distress.    Appearance: She is obese. She is not ill-appearing, toxic-appearing or diaphoretic.  HENT:     Head: Normocephalic and atraumatic.     Mouth/Throat:     Mouth: Mucous membranes are moist.  Eyes:     General: No scleral icterus.    Extraocular Movements: Extraocular movements intact.  Cardiovascular:     Rate and Rhythm: Normal rate and regular rhythm.     Heart sounds: Normal heart sounds.  Pulmonary:     Effort: Pulmonary effort is normal. No respiratory distress.     Breath  sounds: Normal breath sounds.  Abdominal:     Tenderness: There is generalized abdominal tenderness and tenderness in the right lower quadrant, suprapubic area and left lower quadrant. There is no right CVA tenderness, left CVA tenderness, guarding or rebound.  Skin:    General: Skin is warm and dry.     Capillary Refill: Capillary refill takes less than 2 seconds.     Coloration: Skin is not pale.  Neurological:     Mental Status: She is alert and oriented to person, place, and time.  Psychiatric:        Mood  and Affect: Mood normal.        Behavior: Behavior normal.       Vitals:   06/01/18 0909  BP: (!) 142/82  Pulse: 88  Temp: (!) 97.3 F (36.3 C)  SpO2: 96%   Wt Readings from Last 3 Encounters:  06/01/18 214 lb (97.1 kg)  05/12/18 219 lb (99.3 kg)  03/30/18 213 lb 9.6 oz (96.9 kg)    Assessment & Plan:   Generalized abdominal pain/constipation - UA unremarkable. Urine pregnancy negative. patient advised to continue using MiraLAX to help add fiber to oral intake and get bowel moving.  Patient will also get lab work today including CBC, CMP, amylase and lipase to further investigate abdominal pain.  We will get CT scan of abdomen and pelvis to investigate pain as well due to its unclear reason for cause.  Patient did get other lab work today as ordered by her PCP as discussed in the HPI.  Patient will keep regularly scheduled follow-up with PCP as planned.  Advised she will be contacted in regard to CT scan appointment.  Advised abdominal pain becomes increasingly worse, to call and let us know and/or go to ER right away.

## 2018-06-02 LAB — MEASLES/MUMPS/RUBELLA IMMUNITY
Mumps IgG: <9 AU/mL — ABNORMAL LOW
Rubella: <0.9 index — ABNORMAL LOW
Rubeola IgG: <13.5 AU/mL — ABNORMAL LOW

## 2018-06-03 LAB — URINE CULTURE
MICRO NUMBER:: 91501886
SPECIMEN QUALITY:: ADEQUATE

## 2018-06-03 MED ORDER — SULFAMETHOXAZOLE-TRIMETHOPRIM 800-160 MG PO TABS: 1.0000 | ORAL_TABLET | Freq: Two times a day (BID) | ORAL | 0 refills | Status: DC

## 2018-06-03 NOTE — Addendum Note (Signed)
Addended by: Philis Nettle on: 06/03/2018 03:00 PM   Modules accepted: Orders

## 2018-06-05 ENCOUNTER — Other Ambulatory Visit: Payer: Self-pay | Admitting: Internal Medicine

## 2018-06-05 DIAGNOSIS — E039 Hypothyroidism, unspecified: Secondary | ICD-10-CM

## 2018-06-05 MED ORDER — LIOTHYRONINE SODIUM 5 MCG PO TABS: 5.0000 ug | ORAL_TABLET | ORAL | Status: DC

## 2018-06-12 ENCOUNTER — Ambulatory Visit: Payer: Managed Care, Other (non HMO)

## 2018-06-18 ENCOUNTER — Telehealth: Payer: Self-pay | Admitting: Internal Medicine

## 2018-06-18 ENCOUNTER — Other Ambulatory Visit: Payer: Self-pay | Admitting: Internal Medicine

## 2018-06-18 DIAGNOSIS — E039 Hypothyroidism, unspecified: Secondary | ICD-10-CM

## 2018-06-18 MED ORDER — SYNTHROID 200 MCG PO TABS: ORAL_TABLET | ORAL | 0 refills | Status: DC

## 2018-06-18 NOTE — Telephone Encounter (Signed)
Call pt she needs to call [facility] endocrine and schedule appt with them to f/u with thyroid issues thyroid is still not controlled I want her to take cytomel T3 every other day 5 mg and same dose of thyroid medication synthyroid but she needs to continue to follow up with [facility] for her thyroid   Melissa call to schedule [facility] endocrine appt   [place]

## 2018-06-19 NOTE — Telephone Encounter (Signed)
Patient was informed.  Patient understood and no questions, comments, or concerns at this time.  

## 2018-07-30 ENCOUNTER — Encounter: Payer: Self-pay | Admitting: Internal Medicine

## 2018-08-18 ENCOUNTER — Encounter: Payer: Self-pay | Admitting: Internal Medicine

## 2018-09-10 ENCOUNTER — Other Ambulatory Visit: Payer: Self-pay | Admitting: Internal Medicine

## 2018-09-10 ENCOUNTER — Other Ambulatory Visit: Payer: Self-pay

## 2018-09-10 ENCOUNTER — Encounter: Payer: Self-pay | Admitting: Internal Medicine

## 2018-09-10 ENCOUNTER — Ambulatory Visit (INDEPENDENT_AMBULATORY_CARE_PROVIDER_SITE_OTHER): Payer: Managed Care, Other (non HMO) | Admitting: Internal Medicine

## 2018-09-10 VITALS — BP 154/86 | HR 75 | Temp 97.5°F | Ht 62.0 in | Wt 218.2 lb

## 2018-09-10 DIAGNOSIS — R7303 Prediabetes: Secondary | ICD-10-CM | POA: Diagnosis not present

## 2018-09-10 DIAGNOSIS — G4700 Insomnia, unspecified: Secondary | ICD-10-CM | POA: Diagnosis not present

## 2018-09-10 DIAGNOSIS — F329 Major depressive disorder, single episode, unspecified: Secondary | ICD-10-CM

## 2018-09-10 DIAGNOSIS — E538 Deficiency of other specified B group vitamins: Secondary | ICD-10-CM

## 2018-09-10 DIAGNOSIS — E039 Hypothyroidism, unspecified: Secondary | ICD-10-CM | POA: Diagnosis not present

## 2018-09-10 DIAGNOSIS — F419 Anxiety disorder, unspecified: Secondary | ICD-10-CM | POA: Diagnosis not present

## 2018-09-10 DIAGNOSIS — E785 Hyperlipidemia, unspecified: Secondary | ICD-10-CM | POA: Diagnosis not present

## 2018-09-10 DIAGNOSIS — I10 Essential (primary) hypertension: Secondary | ICD-10-CM | POA: Diagnosis not present

## 2018-09-10 DIAGNOSIS — Z0000 Encounter for general adult medical examination without abnormal findings: Secondary | ICD-10-CM | POA: Diagnosis not present

## 2018-09-10 DIAGNOSIS — J40 Bronchitis, not specified as acute or chronic: Secondary | ICD-10-CM

## 2018-09-10 DIAGNOSIS — J309 Allergic rhinitis, unspecified: Secondary | ICD-10-CM

## 2018-09-10 DIAGNOSIS — Z1231 Encounter for screening mammogram for malignant neoplasm of breast: Secondary | ICD-10-CM

## 2018-09-10 DIAGNOSIS — J4530 Mild persistent asthma, uncomplicated: Secondary | ICD-10-CM

## 2018-09-10 DIAGNOSIS — E559 Vitamin D deficiency, unspecified: Secondary | ICD-10-CM

## 2018-09-10 DIAGNOSIS — K219 Gastro-esophageal reflux disease without esophagitis: Secondary | ICD-10-CM

## 2018-09-10 LAB — TSH: TSH: 1.55 u[IU]/mL (ref 0.35–4.50)

## 2018-09-10 LAB — CBC WITH DIFFERENTIAL/PLATELET
BASOS PCT: 0.5 % (ref 0.0–3.0)
Basophils Absolute: 0 10*3/uL (ref 0.0–0.1)
Eosinophils Absolute: 0 10*3/uL (ref 0.0–0.7)
Eosinophils Relative: 0.4 % (ref 0.0–5.0)
HCT: 39.7 % (ref 36.0–46.0)
Hemoglobin: 13.8 g/dL (ref 12.0–15.0)
Lymphocytes Relative: 39.3 % (ref 12.0–46.0)
Lymphs Abs: 2.4 10*3/uL (ref 0.7–4.0)
MCHC: 34.7 g/dL (ref 30.0–36.0)
MCV: 92.6 fl (ref 78.0–100.0)
Monocytes Absolute: 0.5 10*3/uL (ref 0.1–1.0)
Monocytes Relative: 8 % (ref 3.0–12.0)
Neutro Abs: 3.1 10*3/uL (ref 1.4–7.7)
Neutrophils Relative %: 51.8 % (ref 43.0–77.0)
PLATELETS: 269 10*3/uL (ref 150.0–400.0)
RBC: 4.29 Mil/uL (ref 3.87–5.11)
RDW: 14.1 % (ref 11.5–15.5)
WBC: 6 10*3/uL (ref 4.0–10.5)

## 2018-09-10 LAB — LIPID PANEL
Cholesterol: 241 mg/dL — ABNORMAL HIGH (ref 0–200)
HDL: 115.4 mg/dL (ref >39.00)
LDL Cholesterol: 105 mg/dL — ABNORMAL HIGH (ref 0–99)
NonHDL: 125.38
Total CHOL/HDL Ratio: 2
Triglycerides: 104 mg/dL (ref 0.0–149.0)
VLDL: 20.8 mg/dL (ref 0.0–40.0)

## 2018-09-10 LAB — COMPREHENSIVE METABOLIC PANEL
ALT: 16 U/L (ref 0–35)
AST: 16 U/L (ref 0–37)
Albumin: 3.5 g/dL (ref 3.5–5.2)
Alkaline Phosphatase: 51 U/L (ref 39–117)
BUN: 11 mg/dL (ref 6–23)
CO2: 30 mEq/L (ref 19–32)
Calcium: 10.4 mg/dL (ref 8.4–10.5)
Chloride: 98 mEq/L (ref 96–112)
Creatinine, Ser: 0.78 mg/dL (ref 0.40–1.20)
GFR: 76.68 mL/min (ref >60.00)
Glucose, Bld: 98 mg/dL (ref 70–99)
Potassium: 4.2 mEq/L (ref 3.5–5.1)
Sodium: 137 mEq/L (ref 135–145)
Total Bilirubin: 0.5 mg/dL (ref 0.2–1.2)
Total Protein: 7.9 g/dL (ref 6.0–8.3)

## 2018-09-10 LAB — HEMOGLOBIN A1C: Hgb A1c MFr Bld: 5.8 % (ref 4.6–6.5)

## 2018-09-10 MED ORDER — AMLODIPINE BESYLATE 2.5 MG PO TABS: 2.5000 mg | ORAL_TABLET | Freq: Every day | ORAL | 3 refills | Status: DC

## 2018-09-10 MED ORDER — CETIRIZINE HCL 10 MG PO TABS: 10.0000 mg | ORAL_TABLET | Freq: Every day | ORAL | 3 refills | Status: DC

## 2018-09-10 MED ORDER — B-12 1000 MCG SL SUBL: 1.0000 | SUBLINGUAL_TABLET | Freq: Every day | SUBLINGUAL | Status: DC

## 2018-09-10 MED ORDER — ALBUTEROL SULFATE HFA 108 (90 BASE) MCG/ACT IN AERS: 2.0000 | INHALATION_SPRAY | Freq: Four times a day (QID) | RESPIRATORY_TRACT | 11 refills | Status: DC | PRN

## 2018-09-10 MED ORDER — MONTELUKAST SODIUM 10 MG PO TABS: 10.0000 mg | ORAL_TABLET | Freq: Every day | ORAL | 3 refills | Status: DC

## 2018-09-10 MED ORDER — SYNTHROID 200 MCG PO TABS: ORAL_TABLET | ORAL | 3 refills | Status: DC

## 2018-09-10 MED ORDER — CHOLECALCIFEROL 1.25 MG (50000 UT) PO CAPS: 50000.0000 [IU] | ORAL_CAPSULE | ORAL | 1 refills | Status: DC

## 2018-09-10 MED ORDER — CLONAZEPAM 1 MG PO TABS: 1.0000 mg | ORAL_TABLET | Freq: Every evening | ORAL | 5 refills | Status: DC | PRN

## 2018-09-10 MED ORDER — VENLAFAXINE HCL ER 150 MG PO CP24: 150.0000 mg | ORAL_CAPSULE | Freq: Every day | ORAL | 3 refills | Status: DC

## 2018-09-10 MED ORDER — BUDESONIDE-FORMOTEROL FUMARATE 80-4.5 MCG/ACT IN AERO: 2.0000 | INHALATION_SPRAY | Freq: Two times a day (BID) | RESPIRATORY_TRACT | 11 refills | Status: DC

## 2018-09-10 MED ORDER — LISINOPRIL-HYDROCHLOROTHIAZIDE 20-12.5 MG PO TABS: 2.0000 | ORAL_TABLET | Freq: Every day | ORAL | 3 refills | Status: DC

## 2018-09-10 MED ORDER — PANTOPRAZOLE SODIUM 40 MG PO TBEC: 40.0000 mg | DELAYED_RELEASE_TABLET | Freq: Every day | ORAL | 3 refills | Status: DC

## 2018-09-10 NOTE — Patient Instructions (Addendum)
Pneumonia 23 vaccine due -every 5 years (we have here)  Mumps and measles MMR vaccine 2 doses  Hepatitis B vaccine we have here 2 doses    Vitamin D Deficiency Vitamin D deficiency is when your body does not have enough vitamin D. Vitamin D is important to your body for many reasons:  It helps the body to absorb two important minerals, called calcium and phosphorus.  It plays a role in bone health.  It may help to prevent some diseases, such as diabetes and multiple sclerosis.  It plays a role in muscle function, including heart function. You can get vitamin D by:  Eating foods that naturally contain vitamin D.  Eating or drinking milk or other dairy products that have vitamin D added to them.  Taking a vitamin D supplement or a multivitamin supplement that contains vitamin D.  Being in the sun. Your body naturally makes vitamin D when your skin is exposed to sunlight. Your body changes the sunlight into a form of the vitamin that the body can use. If vitamin D deficiency is severe, it can cause a condition in which your bones become soft. In adults, this condition is called osteomalacia. In children, this condition is called rickets. What are the causes? Vitamin D deficiency may be caused by:  Not eating enough foods that contain vitamin D.  Not getting enough sun exposure.  Having certain digestive system diseases that make it difficult for your body to absorb vitamin D. These diseases include Crohn disease, chronic pancreatitis, and cystic fibrosis.  Having a surgery in which a part of the stomach or a part of the small intestine is removed.  Being obese.  Having chronic kidney disease or liver disease. What increases the risk? This condition is more likely to develop in:  Older people.  People who do not spend much time outdoors.  People who live in a long-term care facility.  People who have had broken bones.  People with weak or thin bones  (osteoporosis).  People who have a disease or condition that changes how the body absorbs vitamin D.  People who have dark skin.  People who take certain medicines, such as steroid medicines or certain seizure medicines.  People who are overweight or obese. What are the signs or symptoms? In mild cases of vitamin D deficiency, there may not be any symptoms. If the condition is severe, symptoms may include:  Bone pain.  Muscle pain.  Falling often.  Broken bones caused by a minor injury. How is this diagnosed? This condition is usually diagnosed with a blood test. How is this treated? Treatment for this condition may depend on what caused the condition. Treatment options include:  Taking vitamin D supplements.  Taking a calcium supplement. Your health care provider will suggest what dose is best for you. Follow these instructions at home:  Take medicines and supplements only as told by your health care provider.  Eat foods that contain vitamin D. Choices include: ? Fortified dairy products, cereals, or juices. Fortified means that vitamin D has been added to the food. Check the label on the package to be sure. ? Fatty fish, such as salmon or trout. ? Eggs. ? Oysters.  Do not use a tanning bed.  Maintain a healthy weight. Lose weight, if needed.  Keep all follow-up visits as told by your health care provider. This is important. Contact a health care provider if:  Your symptoms do not go away.  You feel like throwing  up (nausea) or you throw up (vomit).  You have fewer bowel movements than usual or it is difficult for you to have a bowel movement (constipation). This information is not intended to replace advice given to you by your health care provider. Make sure you discuss any questions you have with your health care provider. Document Released: 08/26/2011 Document Revised: 11/15/2015 Document Reviewed: 10/19/2014 Elsevier Interactive Patient Education  2019  [place].  Insomnia Insomnia is a sleep disorder that makes it difficult to fall asleep or stay asleep. Insomnia can cause fatigue, low energy, difficulty concentrating, mood swings, and poor performance at work or school. There are three different ways to classify insomnia:  Difficulty falling asleep.  Difficulty staying asleep.  Waking up too early in the morning. Any type of insomnia can be long-term (chronic) or short-term (acute). Both are common. Short-term insomnia usually lasts for three months or less. Chronic insomnia occurs at least three times a week for longer than three months. What are the causes? Insomnia may be caused by another condition, situation, or substance, such as:  Anxiety.  Certain medicines.  Gastroesophageal reflux disease (GERD) or other gastrointestinal conditions.  Asthma or other breathing conditions.  Restless legs syndrome, sleep apnea, or other sleep disorders.  Chronic pain.  Menopause.  Stroke.  Abuse of alcohol, tobacco, or illegal drugs.  Mental health conditions, such as depression.  Caffeine.  Neurological disorders, such as Alzheimer's disease.  An overactive thyroid (hyperthyroidism). Sometimes, the cause of insomnia may not be known. What increases the risk? Risk factors for insomnia include:  Gender. Women are affected more often than men.  Age. Insomnia is more common as you get older.  Stress.  Lack of exercise.  Irregular work schedule or working night shifts.  Traveling between different time zones.  Certain medical and mental health conditions. What are the signs or symptoms? If you have insomnia, the main symptom is having trouble falling asleep or having trouble staying asleep. This may lead to other symptoms, such as:  Feeling fatigued or having low energy.  Feeling nervous about going to sleep.  Not feeling rested in the morning.  Having trouble concentrating.  Feeling irritable, anxious, or  depressed. How is this diagnosed? This condition may be diagnosed based on:  Your symptoms and medical history. Your health care provider may ask about: ? Your sleep habits. ? Any medical conditions you have. ? Your mental health.  A physical exam. How is this treated? Treatment for insomnia depends on the cause. Treatment may focus on treating an underlying condition that is causing insomnia. Treatment may also include:  Medicines to help you sleep.  Counseling or therapy.  Lifestyle adjustments to help you sleep better. Follow these instructions at home: Eating and drinking   Limit or avoid alcohol, caffeinated beverages, and cigarettes, especially close to bedtime. These can disrupt your sleep.  Do not eat a large meal or eat spicy foods right before bedtime. This can lead to digestive discomfort that can make it hard for you to sleep. Sleep habits   Keep a sleep diary to help you and your health care provider figure out what could be causing your insomnia. Write down: ? When you sleep. ? When you wake up during the night. ? How well you sleep. ? How rested you feel the next day. ? Any side effects of medicines you are taking. ? What you eat and drink.  Make your bedroom a dark, comfortable place where it is easy to  fall asleep. ? Put up shades or blackout curtains to block light from outside. ? Use a white noise machine to block noise. ? Keep the temperature cool.  Limit screen use before bedtime. This includes: ? Watching TV. ? Using your smartphone, tablet, or computer.  Stick to a routine that includes going to bed and waking up at the same times every day and night. This can help you fall asleep faster. Consider making a quiet activity, such as reading, part of your nighttime routine.  Try to avoid taking naps during the day so that you sleep better at night.  Get out of bed if you are still awake after 15 minutes of trying to sleep. Keep the lights down, but  try reading or doing a quiet activity. When you feel sleepy, go back to bed. General instructions  Take over-the-counter and prescription medicines only as told by your health care provider.  Exercise regularly, as told by your health care provider. Avoid exercise starting several hours before bedtime.  Use relaxation techniques to manage stress. Ask your health care provider to suggest some techniques that may work well for you. These may include: ? Breathing exercises. ? Routines to release muscle tension. ? Visualizing peaceful scenes.  Make sure that you drive carefully. Avoid driving if you feel very sleepy.  Keep all follow-up visits as told by your health care provider. This is important. Contact a health care provider if:  You are tired throughout the day.  You have trouble in your daily routine due to sleepiness.  You continue to have sleep problems, or your sleep problems get worse. Get help right away if:  You have serious thoughts about hurting yourself or someone else. If you ever feel like you may hurt yourself or others, or have thoughts about taking your own life, get help right away. You can go to your nearest emergency department or call:  Your local emergency services (911 in the [place]  A suicide crisis helpline, such as the [place] at (367)540-2779. This is open 24 hours a day. Summary  Insomnia is a sleep disorder that makes it difficult to fall asleep or stay asleep.  Insomnia can be long-term (chronic) or short-term (acute).  Treatment for insomnia depends on the cause. Treatment may focus on treating an underlying condition that is causing insomnia.  Keep a sleep diary to help you and your health care provider figure out what could be causing your insomnia. This information is not intended to replace advice given to you by your health care provider. Make sure you discuss any questions you have with your health care  provider. Document Released: 05/31/2000 Document Revised: 03/13/2017 Document Reviewed: 03/13/2017 Elsevier Interactive Patient Education  2019 [employer].

## 2018-09-10 NOTE — Progress Notes (Signed)
Chief Complaint  Patient presents with  . Follow-up   Annual/Physical with work form 1. HTN uncontrolled noncompliant with meds on norvasc 2.5  Mg, zestroric 2 pills 20-12.5 but she misses some doses  2. Vitamin B12 and vitamin D on B12 1000 SL, D3 50K IU weekly  3. Anxiety/insomnia uncontrolled temazepam 30 mg qhs prn not helping she is on effexor 150 xr which is helping work is stressfull works as Production designer, theatre/television/film and more calls with suicidal attempts/ideation and family issues due to [place] she was on klonopin in the past which helped but no help with trazadone, lunesta, ambien  4. Hypothyroidism f/u with endocrine [facility] saw 06/2018 and d/c cytomet and only continue synthyroid (brand) 200 mcg qd due to cytomel not working  5. Chronic bronchitis with tobacco abuse she is smoking still but using symbicort inhaler and prn albuterol which is helping  6. Prediabetes last A1C 5.7    Review of Systems  Constitutional: Negative for weight loss.  HENT: Negative for hearing loss.   Eyes: Negative for blurred vision.  Respiratory: Negative for shortness of breath and wheezing.   Cardiovascular: Negative for chest pain.  Gastrointestinal: Negative for abdominal pain.  Skin: Negative for rash.  Neurological: Negative for headaches.  Psychiatric/Behavioral: The patient is nervous/anxious and has insomnia.    Past Medical History:  Diagnosis Date  . Anxiety   . Arthritis   . Asthma   . Depression   . GERD (gastroesophageal reflux disease)   . Hypertension   . Hypothyroidism   . Pneumonia   . Thyroid disease    Past Surgical History:  Procedure Laterality Date  . ABLATION  2006  . BREAST CYST ASPIRATION Left 1998  . BREAST SURGERY     cyst aspiration  . BREAST SURGERY  1998   cyst aspiration  . COLONOSCOPY  03/2014  . ENDOMETRIAL ABLATION    . ESOPHAGOGASTRODUODENOSCOPY (EGD) WITH PROPOFOL N/A 08/15/2015   Procedure: ESOPHAGOGASTRODUODENOSCOPY (EGD) WITH PROPOFOL;  Surgeon: Lollie Sails, MD;  Location[facility] ENDOSCOPY;  Service: Endoscopy;  Laterality: N/A;  . ESOPHAGOGASTRODUODENOSCOPY (EGD) WITH PROPOFOL N/A 01/22/2016   Procedure: ESOPHAGOGASTRODUODENOSCOPY (EGD) WITH PROPOFOL;  Surgeon: Lollie Sails, MD;  Location: [facility] ENDOSCOPY;  Service: Endoscopy;  Laterality: N/A;  . GASTRIC BYPASS  2005  . HERNIA REPAIR    . NASAL SINUS SURGERY    . TOTAL THYROIDECTOMY    . TUBAL LIGATION     Family History  Problem Relation Age of Onset  . Heart disease Mother   . COPD Mother   . Arthritis Mother   . Cancer Mother        uterine  . Lupus Mother   . Anxiety disorder Mother   . Depression Mother   . Drug abuse Mother   . COPD Father   . Arthritis Father   . Heart disease Father   . Heart attack Father   . Alcohol abuse Father   . Heart disease Brother   . Heart attack Brother   . Drug abuse Brother   . Anxiety disorder Brother   . Depression Brother   . Heart disease Brother   . Diabetes Maternal Grandmother   . Diabetes Maternal Grandfather   . Diabetes Paternal Grandmother   . Diabetes Paternal Grandfather    Social History   Socioeconomic History  . Marital status: Divorced    Spouse name: Not on file  . Number of children: 2  . Years of education: Not on file  .  Highest education level: High school graduate  Occupational History  . Not on file  Social Needs  . Financial resource strain: Not hard at all  . Food insecurity:    Worry: Never true    Inability: Never true  . Transportation needs:    Medical: No    Non-medical: No  Tobacco Use  . Smoking status: Current Every Day Smoker    Packs/day: 1.00    Years: 30.00    Pack years: 30.00    Types: Cigarettes    Start date: 03/15/1980  . Smokeless tobacco: Never Used  Substance and Sexual Activity  . Alcohol use: Yes    Alcohol/week: 10.0 - 12.0 standard drinks    Types: 8 - 10 Glasses of wine, 2 Standard drinks or equivalent per week  . Drug use: No  . Sexual activity: Yes     Partners: Male  Lifestyle  . Physical activity:    Days per week: 4 days    Minutes per session: 40 min  . Stress: Very much  Relationships  . Social connections:    Talks on phone: Three times a week    Gets together: Once a week    Attends religious service: Never    Active member of club or organization: No    Attends meetings of clubs or organizations: Never    Relationship status: Divorced  . Intimate partner violence:    Fear of current or ex partner: No    Emotionally abused: No    Physically abused: No    Forced sexual activity: No  Other Topics Concern  . 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 albuterol (PROVENTIL HFA;VENTOLIN HFA) 108 (90 Base) MCG/ACT inhaler Inhale 2 puffs into the lungs every 6 (six) hours as needed for wheezing or shortness of breath.  Marland Kitchen amLODipine (NORVASC) 2.5 MG tablet Take by mouth.  . budesonide-formoterol (SYMBICORT) 80-4.5 MCG/ACT inhaler Inhale 2 puffs into the lungs 2 (two) times daily. Rinse mouth  . cetirizine (ZYRTEC) 10 MG tablet Take 1 tablet (10 mg total) by mouth daily.  . Cholecalciferol (VITAMIN D) 2000 units CAPS Take by mouth.  . Insulin Pen Needle (PEN NEEDLES) 31G X 8 MM MISC 1 Device by Does not apply route daily.  Marland Kitchen liothyronine (CYTOMEL) 5 MCG tablet Take 1 tablet (5 mcg total) by mouth every other day.  . Liraglutide -Weight Management (SAXENDA) 18 MG/3ML SOPN Inject 0.6 mg into the skin daily. X 1 week, week 2 inject 1.2 mg daily, week 3 1.8 daily, week 4 2.4 daily and week 5 3.0 daily  . lisinopril-hydrochlorothiazide (ZESTORETIC) 20-12.5 MG tablet Take 2 tablets by mouth daily.  . montelukast (SINGULAIR) 10 MG tablet Take 1 tablet (10 mg total) by mouth at bedtime.  . pantoprazole (PROTONIX) 40 MG tablet TAKE 1 TABLET BY MOUTH ONCE DAILY  .  sulfamethoxazole-trimethoprim (BACTRIM DS,SEPTRA DS) 800-160 MG tablet Take 1 tablet by mouth 2 (two) times daily.  Marland Kitchen SYNTHROID 200 MCG tablet TAKE 1 TABLET BY MOUTH ONCE daily before breakfast 30 minutes  Further refills from [facility] endocrine Dr. Honor Junes, Marcello Moores no exceptions. Call to make appt endocrine  . temazepam (RESTORIL) 30 MG capsule Take 1 capsule (30 mg total) by mouth at  bedtime as needed for sleep.  Marland Kitchen venlafaxine XR (EFFEXOR-XR) 150 MG 24 hr capsule Take 1 capsule (150 mg total) by mouth daily with breakfast.   No Known Allergies No results found for this or any previous visit (from the past 2160 hour(s)). Objective  Body mass index is 39.91 kg/m. Wt Readings from Last 3 Encounters:  09/10/18 218 lb 3.2 oz (99 kg)  06/01/18 214 lb (97.1 kg)  05/12/18 219 lb (99.3 kg)   Temp Readings from Last 3 Encounters:  09/10/18 (!) 97.5 F (36.4 C) (Oral)  06/01/18 (!) 97.3 F (36.3 C) (Oral)  05/12/18 98.2 F (36.8 C) (Oral)   BP Readings from Last 3 Encounters:  09/10/18 (!) 154/86  06/01/18 (!) 142/82  05/12/18 (!) 150/90   Pulse Readings from Last 3 Encounters:  09/10/18 75  06/01/18 88  05/12/18 93    Physical Exam Vitals signs and nursing note reviewed.  Constitutional:      Appearance: Normal appearance. She is well-developed and well-groomed. She is obese.  HENT:     Head: Normocephalic and atraumatic.     Mouth/Throat:     Mouth: Mucous membranes are moist.     Pharynx: Oropharynx is clear.  Eyes:     Conjunctiva/sclera: Conjunctivae normal.     Pupils: Pupils are equal, round, and reactive to light.  Cardiovascular:     Rate and Rhythm: Normal rate and regular rhythm.     Heart sounds: Normal heart sounds. No murmur.  Pulmonary:     Effort: Pulmonary effort is normal.     Breath sounds: Wheezing present.     Comments: Mild wheezing right lower lung field but overall sig improved   Skin:    General: Skin is warm and dry.  Neurological:      General: No focal deficit present.     Mental Status: She is alert and oriented to person, place, and time. Mental status is at baseline.     Gait: Gait normal.  Psychiatric:        Attention and Perception: Attention and perception normal.        Mood and Affect: Mood and affect normal.        Speech: Speech normal.        Behavior: Behavior normal. Behavior is cooperative.        Thought Content: Thought content normal.        Cognition and Memory: Cognition and memory normal.        Judgment: Judgment normal.     Assessment   1. HTN/HLD uncontrolled noncompliant with meds  2. Vitamin B12 and vitamin D def  3. 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 Hypothyroidism f/u with endocrine [facility] saw 06/2018 and d/c cytomel and only continue synthyroid (brand) 200 mcg qd due to cytomel not working  5. Chronic bronchitis/asthma with tobacco abuse overall improved she is smoking still but using symbicort inhaler and prn albuterol which is helping  6. Prediabetes last A1C 5.7  7. Annual   Plan   1. Stressed med compliance  on norvasc 2.5  Mg, zestroric 2 pills 20-12.5 but she misses some doses at times Check fasting labs today cmet, cbc lipid, A1C  2.  Cont B12 1000 qd and D3 50K weekly  3. D/c restoril 30 mg and change to klonopin 1 mg qhs prn  Cont effexor 150 mg xr qd  4. F/u endocrine [facility]  Off cytomel 5 mg qd  Cont synthyroid 200 mcg qd brancd only  5. Cont inhalers  rec smoking cessation  6. Check A1C fasting today 7.  Flu shothad 03/30/18  pna 23 had 03/2012 due at f/uin future Tdap UTD10/25/13 rec hepBvaccinefor future Consider MMR in future Check fasting labs today and TSH today  with A1C   mammo  03/31/18 neg ordered today  Papneg 11/20/17 no HPV EGD/colonoscopy had [facility] , need to get report colonoscopy 10/10/15[facility] Also EGD due 01/22/2019 barretts last 01/21/14 no dysplasia rec smoking cesasationand exercise to lose with healthy diet choices  Filled out physical form for [employer]   Provider: Dr. Olivia Mackie McLean-Scocuzza-Internal Medicine

## 2018-09-10 NOTE — Progress Notes (Signed)
Pre visit review using our clinic review tool, if applicable. No additional management support is needed unless otherwise documented below in the visit note. 

## 2018-09-11 ENCOUNTER — Encounter: Payer: Self-pay | Admitting: Internal Medicine

## 2018-09-23 ENCOUNTER — Telehealth: Payer: Self-pay | Admitting: Internal Medicine

## 2018-09-23 NOTE — Telephone Encounter (Signed)
sch f/u by 03/2019   thanks [place]

## 2018-10-23 ENCOUNTER — Telehealth: Payer: Self-pay | Admitting: Internal Medicine

## 2018-10-23 ENCOUNTER — Other Ambulatory Visit: Payer: Self-pay

## 2018-10-23 ENCOUNTER — Other Ambulatory Visit: Payer: Self-pay | Admitting: Internal Medicine

## 2018-10-23 ENCOUNTER — Ambulatory Visit (INDEPENDENT_AMBULATORY_CARE_PROVIDER_SITE_OTHER): Payer: Managed Care, Other (non HMO) | Admitting: Internal Medicine

## 2018-10-23 DIAGNOSIS — W19XXXA Unspecified fall, initial encounter: Secondary | ICD-10-CM

## 2018-10-23 DIAGNOSIS — G4700 Insomnia, unspecified: Secondary | ICD-10-CM

## 2018-10-23 DIAGNOSIS — M7918 Myalgia, other site: Secondary | ICD-10-CM

## 2018-10-23 DIAGNOSIS — M25511 Pain in right shoulder: Secondary | ICD-10-CM | POA: Diagnosis not present

## 2018-10-23 DIAGNOSIS — R0781 Pleurodynia: Secondary | ICD-10-CM

## 2018-10-23 DIAGNOSIS — F419 Anxiety disorder, unspecified: Secondary | ICD-10-CM

## 2018-10-23 MED ORDER — OXYCODONE-ACETAMINOPHEN 5-325 MG PO TABS: 1.0000 | ORAL_TABLET | Freq: Two times a day (BID) | ORAL | 0 refills | Status: DC | PRN

## 2018-10-23 MED ORDER — CYCLOBENZAPRINE HCL 5 MG PO TABS: 5.0000 mg | ORAL_TABLET | Freq: Two times a day (BID) | ORAL | 0 refills | Status: DC | PRN

## 2018-10-23 MED ORDER — CLONAZEPAM 1 MG PO TABS: 1.0000 mg | ORAL_TABLET | Freq: Every evening | ORAL | 5 refills | Status: DC | PRN

## 2018-10-23 NOTE — Telephone Encounter (Signed)
Pt pharmacy has been updated. It had the incorrect state. Please resend medication. Pt is waitng at the pharmacy. Thank you!  Pharmacy is [facility] [place], [address] [place]

## 2018-10-23 NOTE — Telephone Encounter (Signed)
Called and informed pt that her medication was sent to the correct pharmacy.  Jesse Nosbisch,cma

## 2018-10-23 NOTE — Progress Notes (Signed)
Virtual Visit via Video Note  I connected with Paula Garrett  on 10/23/18 at 10:16 AM EDT by a video enabled telemedicine application and verified that I am speaking with the correct person using two identifiers.  Location patient: home Location provider:work  Persons participating in the virtual visit: patient, provider  I discussed the limitations of evaluation and management by telemedicine and the availability of in person appointments. The patient expressed understanding and agreed to proceed.   HPI: Fall down 13 carpeted stairs in home at 5:15 am this am and hitting bottom which is hardwood in a ball right shoulder hurting with small bruise, left rib painful but does not hurt to breath in and buttocks hurt nothing tried. No LOC and did not hit her head she was rushing to go down stairs to go to work. She doesn't feel like has broken bones. Tried Excedrin w/o relief.   ROS: See pertinent positives and negatives per HPI.  Past Medical History:  Diagnosis Date  . Anxiety   . Arthritis   . Asthma   . Depression   . GERD (gastroesophageal reflux disease)   . Hypertension   . Hypothyroidism   . Pneumonia   . Thyroid disease     Past Surgical History:  Procedure Laterality Date  . ABLATION  2006  . BREAST CYST ASPIRATION Left 1998  . BREAST SURGERY     cyst aspiration  . BREAST SURGERY  1998   cyst aspiration  . COLONOSCOPY  03/2014  . ENDOMETRIAL ABLATION    . ESOPHAGOGASTRODUODENOSCOPY (EGD) WITH PROPOFOL N/A 08/15/2015   Procedure: ESOPHAGOGASTRODUODENOSCOPY (EGD) WITH PROPOFOL;  Surgeon: Lollie Sails, MD;  Location: [facility] ENDOSCOPY;  Service: Endoscopy;  Laterality: N/A;  . ESOPHAGOGASTRODUODENOSCOPY (EGD) WITH PROPOFOL N/A 01/22/2016   Procedure: ESOPHAGOGASTRODUODENOSCOPY (EGD) WITH PROPOFOL;  Surgeon: Lollie Sails, MD;  Location: [facility] ENDOSCOPY;  Service: Endoscopy;  Laterality: N/A;  . GASTRIC BYPASS  2005  . HERNIA REPAIR    . NASAL SINUS SURGERY    . TOTAL  THYROIDECTOMY    . TUBAL LIGATION      Family History  Problem Relation Age of Onset  . Heart disease Mother   . COPD Mother   . Arthritis Mother   . Cancer Mother        uterine  . Lupus Mother   . Anxiety disorder Mother   . Depression Mother   . Drug abuse Mother   . COPD Father   . Arthritis Father   . Heart disease Father   . Heart attack Father   . Alcohol abuse Father   . Heart disease Brother   . Heart attack Brother   . Drug abuse Brother   . Anxiety disorder Brother   . Depression Brother   . Heart disease Brother   . Diabetes Maternal Grandmother   . Diabetes Maternal Grandfather   . Diabetes Paternal Grandmother   . Diabetes Paternal Grandfather     SOCIAL HX: lives alone   Current Outpatient Medications:  .  albuterol (PROVENTIL HFA;VENTOLIN HFA) 108 (90 Base) MCG/ACT inhaler, Inhale 2 puffs into the lungs every 6 (six) hours as needed for wheezing or shortness of breath., Disp: 1 Inhaler, Rfl: 11 .  amLODipine (NORVASC) 2.5 MG tablet, Take 1 tablet (2.5 mg total) by mouth daily., Disp: 90 tablet, Rfl: 3 .  budesonide-formoterol (SYMBICORT) 80-4.5 MCG/ACT inhaler, Inhale 2 puffs into the lungs 2 (two) times daily. Rinse mouth, Disp: 1 Inhaler, Rfl: 11 .  cetirizine (  ZYRTEC) 10 MG tablet, Take 1 tablet (10 mg total) by mouth daily., Disp: 90 tablet, Rfl: 3 .  Cholecalciferol 1.25 MG (50000 UT) capsule, Take 1 capsule (50,000 Units total) by mouth once a week., Disp: 13 capsule, Rfl: 1 .  clonazePAM (KLONOPIN) 1 MG tablet, Take 1 tablet (1 mg total) by mouth at bedtime as needed for anxiety., Disp: 30 tablet, Rfl: 5 .  Cyanocobalamin (B-12) 1000 MCG SUBL, Place 1 tablet under the tongue daily., Disp: 30 each, Rfl:  .  cyclobenzaprine (FLEXERIL) 5 MG tablet, Take 1-2 tablets (5-10 mg total) by mouth 2 (two) times daily as needed for muscle spasms., Disp: 40 tablet, Rfl: 0 .  Insulin Pen Needle (PEN NEEDLES) 31G X 8 MM MISC, 1 Device by Does not apply route  daily., Disp: 90 each, Rfl: 3 .  lisinopril-hydrochlorothiazide (ZESTORETIC) 20-12.5 MG tablet, Take 2 tablets by mouth daily., Disp: 180 tablet, Rfl: 3 .  montelukast (SINGULAIR) 10 MG tablet, Take 1 tablet (10 mg total) by mouth at bedtime., Disp: 90 tablet, Rfl: 3 .  oxyCODONE-acetaminophen (PERCOCET) 5-325 MG tablet, Take 1 tablet by mouth 2 (two) times daily as needed for severe pain., Disp: 10 tablet, Rfl: 0 .  pantoprazole (PROTONIX) 40 MG tablet, Take 1 tablet (40 mg total) by mouth daily. In am 30 min before food, Disp: 90 tablet, Rfl: 3 .  SYNTHROID 200 MCG tablet, TAKE 1 TABLET BY MOUTH ONCE daily before breakfast 30 minutes  Further refills from [facility] clinic endocrine Dr. Honor Junes, Marcello Moores no exceptions. Call to make appt endocrine, Disp: 90 tablet, Rfl: 3 .  venlafaxine XR (EFFEXOR-XR) 150 MG 24 hr capsule, Take 1 capsule (150 mg total) by mouth daily with breakfast., Disp: 90 capsule, Rfl: 3  EXAM:  VITALS per patient if applicable:  GENERAL: alert, oriented, appears well and in no acute distress  HEENT: atraumatic, conjunttiva clear, no obvious abnormalities on inspection of external nose and ears  NECK: normal movements of the head and neck  LUNGS: on inspection no signs of respiratory distress, breathing rate appears normal, no obvious gross SOB, gasping or wheezing  CV: no obvious cyanosis  MS: moves all visible extremities without noticeable abnormality  PSYCH/NEURO: pleasant and cooperative, no obvious depression or anxiety, speech and thought processing grossly intact  Skin small bruise right shoulder ASSESSMENT AND PLAN:  Discussed the following assessment and plan:  Acute pain of right shoulder - Plan: oxyCODONE-acetaminophen (PERCOCET) 5-325 MG tablet bid #10 no refills, cyclobenzaprine (FLEXERIL) 5 MG tablet 1-2 tablets bid prn #40   Rib pain on left side - Plan: oxyCODONE-acetaminophen (PERCOCET) 5-325 MG tablet, cyclobenzaprine (FLEXERIL) 5 MG  tablet  Buttock pain - Plan: oxyCODONE-acetaminophen (PERCOCET) 5-325 MG tablet, cyclobenzaprine (FLEXERIL) 5 MG tablet  Fall, initial encounter - Plan: oxyCODONE-acetaminophen (PERCOCET) 5-325 MG tablet, cyclobenzaprine (FLEXERIL) 5 MG tablet -let me know in 2 weeks if not better  Try heat, warm bath, aspercream/salonpas or bengay otc as well   Anxiety - Plan: clonazePAM (KLONOPIN) 1 MG tablet Insomnia, unspecified type - Plan: clonazePAM (KLONOPIN) 1 MG tablet Wants to change pharmacy to [facility] in [place] not due until 11/10/2018   HM Flu shothad10/14/19 pna 23 had 03/2012 due at f/uin future Tdap UTD10/25/13 rec hepBvaccinefor future Consider MMR in future Check fasting labs today and TSH today with A1C   mammo10/15/19 negordered today  Papneg 11/20/17 no HPV EGD/colonoscopy had [facility] , need to get report colonoscopy 10/10/15[facility] Also EGD due 01/22/2019 barretts last 01/21/14 no dysplasia  rec smoking cesasationand exercise to lose with healthy diet choices  Filled out physical form for [employer]      I discussed the assessment and treatment plan with the patient. The patient was provided an opportunity to ask questions and all were answered. The patient agreed with the plan and demonstrated an understanding of the instructions.   The patient was advised to call back or seek an in-person evaluation if the symptoms worsen or if the condition fails to improve as anticipated.  Time spent 15 minutes Delorise Jackson, MD

## 2018-10-23 NOTE — Telephone Encounter (Signed)
Resent   [place]

## 2018-10-23 NOTE — Telephone Encounter (Signed)
Sent to correct pharmacy sorry about this   [place]

## 2018-10-23 NOTE — Telephone Encounter (Signed)
Copied from [place] [id number]. Topic: General - Other >> Oct 23, 2018 11:39 AM Carolyn Stare wrote: Medication was sent to the wrong pharmacy, it was [facility] not [facility] , Pharmacy has been corrected in Epic, please resend     clonazePAM (KLONOPIN) 1 MG tablet    cyclobenzaprine (FLEXERIL) 5 MG tablet   oxyCODONE-acetaminophen (PERCOCET) 5-325 MG tablet

## 2018-12-24 ENCOUNTER — Inpatient Hospital Stay: Admission: RE | Admit: 2018-12-24 | Payer: Managed Care, Other (non HMO) | Source: Ambulatory Visit

## 2018-12-24 ENCOUNTER — Other Ambulatory Visit: Payer: Self-pay

## 2018-12-24 ENCOUNTER — Telehealth: Payer: Self-pay | Admitting: *Deleted

## 2018-12-24 ENCOUNTER — Encounter: Payer: Self-pay | Admitting: Internal Medicine

## 2018-12-24 ENCOUNTER — Ambulatory Visit (INDEPENDENT_AMBULATORY_CARE_PROVIDER_SITE_OTHER): Payer: Managed Care, Other (non HMO) | Admitting: Internal Medicine

## 2018-12-24 ENCOUNTER — Ambulatory Visit (INDEPENDENT_AMBULATORY_CARE_PROVIDER_SITE_OTHER): Payer: Managed Care, Other (non HMO)

## 2018-12-24 DIAGNOSIS — M545 Low back pain: Secondary | ICD-10-CM

## 2018-12-24 DIAGNOSIS — M5416 Radiculopathy, lumbar region: Secondary | ICD-10-CM

## 2018-12-24 DIAGNOSIS — G8929 Other chronic pain: Secondary | ICD-10-CM

## 2018-12-24 DIAGNOSIS — M25552 Pain in left hip: Secondary | ICD-10-CM

## 2018-12-24 MED ORDER — OXYCODONE-ACETAMINOPHEN 5-325 MG PO TABS: 1.0000 | ORAL_TABLET | Freq: Three times a day (TID) | ORAL | 0 refills | Status: DC | PRN

## 2018-12-24 MED ORDER — CYCLOBENZAPRINE HCL 5 MG PO TABS: 5.0000 mg | ORAL_TABLET | Freq: Two times a day (BID) | ORAL | 2 refills | Status: DC | PRN

## 2018-12-24 NOTE — Patient Instructions (Signed)

## 2018-12-24 NOTE — Telephone Encounter (Signed)
Letter in my chart  [place]

## 2018-12-24 NOTE — Progress Notes (Signed)
Virtual Visit via Video Note  I connected with Paula Garrett  on 12/24/18 at 10:35 AM EDT by a video enabled telemedicine application and verified that I am speaking with the correct person using two identifiers.  Location patient: home Location provider:work  Persons participating in the virtual visit: patient, provider  I discussed the limitations of evaluation and management by telemedicine and the availability of in person appointments. The patient expressed understanding and agreed to proceed.   HPI: 1. Low back pain and left hip pain chronic on and off x 8-10 years worse with sitting 60 hours she feels like she has a pinched nerve. Pain worse over the weekend severe and also worse yesterday 7/7 and 01/2019. Pain is  Severe today worse with standing, lying, sitting. She tried Excedrine at 4 am which did not help. She is not having numbness, weakness or bowel/bladder incontinence. Pain radiates down left lower leg and hip and she is having numbness.  She has tried lidocaine patch in the past w/o relief     ROS: See pertinent positives and negatives per HPI.  Past Medical History:  Diagnosis Date  . Anxiety   . Arthritis   . Asthma   . Depression   . GERD (gastroesophageal reflux disease)   . Hypertension   . Hypothyroidism   . Pneumonia   . Thyroid disease     Past Surgical History:  Procedure Laterality Date  . ABLATION  2006  . BREAST CYST ASPIRATION Left 1998  . BREAST SURGERY     cyst aspiration  . BREAST SURGERY  1998   cyst aspiration  . COLONOSCOPY  03/2014  . ENDOMETRIAL ABLATION    . ESOPHAGOGASTRODUODENOSCOPY (EGD) WITH PROPOFOL N/A 08/15/2015   Procedure: ESOPHAGOGASTRODUODENOSCOPY (EGD) WITH PROPOFOL;  Surgeon: Lollie Sails, MD;  Location: [facility] ENDOSCOPY;  Service: Endoscopy;  Laterality: N/A;  . ESOPHAGOGASTRODUODENOSCOPY (EGD) WITH PROPOFOL N/A 01/22/2016   Procedure: ESOPHAGOGASTRODUODENOSCOPY (EGD) WITH PROPOFOL;  Surgeon: Lollie Sails, MD;   Location: [facility] ENDOSCOPY;  Service: Endoscopy;  Laterality: N/A;  . GASTRIC BYPASS  2005  . HERNIA REPAIR    . NASAL SINUS SURGERY    . TOTAL THYROIDECTOMY    . TUBAL LIGATION      Family History  Problem Relation Age of Onset  . Heart disease Mother   . COPD Mother   . Arthritis Mother   . Cancer Mother        uterine  . Lupus Mother   . Anxiety disorder Mother   . Depression Mother   . Drug abuse Mother   . COPD Father   . Arthritis Father   . Heart disease Father   . Heart attack Father   . Alcohol abuse Father   . Heart disease Brother   . Heart attack Brother   . Drug abuse Brother   . Anxiety disorder Brother   . Depression Brother   . Heart disease Brother   . Cancer Brother        liver cancer age 82 y.o  . Diabetes Maternal Grandmother   . Diabetes Maternal Grandfather   . Diabetes Paternal Grandmother   . Diabetes Paternal Grandfather     SOCIAL HX: lives alone    Current Outpatient Medications:  .  albuterol (PROVENTIL HFA;VENTOLIN HFA) 108 (90 Base) MCG/ACT inhaler, Inhale 2 puffs into the lungs every 6 (six) hours as needed for wheezing or shortness of breath., Disp: 1 Inhaler, Rfl: 11 .  amLODipine (NORVASC) 2.5 MG tablet,  Take 1 tablet (2.5 mg total) by mouth daily., Disp: 90 tablet, Rfl: 3 .  budesonide-formoterol (SYMBICORT) 80-4.5 MCG/ACT inhaler, Inhale 2 puffs into the lungs 2 (two) times daily. Rinse mouth, Disp: 1 Inhaler, Rfl: 11 .  cetirizine (ZYRTEC) 10 MG tablet, Take 1 tablet (10 mg total) by mouth daily., Disp: 90 tablet, Rfl: 3 .  Cholecalciferol 1.25 MG (50000 UT) capsule, Take 1 capsule (50,000 Units total) by mouth once a week., Disp: 13 capsule, Rfl: 1 .  clonazePAM (KLONOPIN) 1 MG tablet, Take 1 tablet (1 mg total) by mouth at bedtime as needed for anxiety., Disp: 30 tablet, Rfl: 5 .  Cyanocobalamin (B-12) 1000 MCG SUBL, Place 1 tablet under the tongue daily., Disp: 30 each, Rfl:  .  cyclobenzaprine (FLEXERIL) 5 MG tablet, Take 1-2  tablets (5-10 mg total) by mouth 2 (two) times daily as needed for muscle spasms., Disp: 40 tablet, Rfl: 2 .  Insulin Pen Needle (PEN NEEDLES) 31G X 8 MM MISC, 1 Device by Does not apply route daily., Disp: 90 each, Rfl: 3 .  lisinopril-hydrochlorothiazide (ZESTORETIC) 20-12.5 MG tablet, Take 2 tablets by mouth daily., Disp: 180 tablet, Rfl: 3 .  montelukast (SINGULAIR) 10 MG tablet, Take 1 tablet (10 mg total) by mouth at bedtime., Disp: 90 tablet, Rfl: 3 .  oxyCODONE-acetaminophen (PERCOCET) 5-325 MG tablet, Take 1 tablet by mouth every 8 (eight) hours as needed for severe pain., Disp: 15 tablet, Rfl: 0 .  pantoprazole (PROTONIX) 40 MG tablet, Take 1 tablet (40 mg total) by mouth daily. In am 30 min before food, Disp: 90 tablet, Rfl: 3 .  SYNTHROID 200 MCG tablet, TAKE 1 TABLET BY MOUTH ONCE daily before breakfast 30 minutes  Further refills from [facility] clinic endocrine Dr. Honor Junes, Marcello Moores no exceptions. Call to make appt endocrine, Disp: 90 tablet, Rfl: 3 .  venlafaxine XR (EFFEXOR-XR) 150 MG 24 hr capsule, Take 1 capsule (150 mg total) by mouth daily with breakfast., Disp: 90 capsule, Rfl: 3  EXAM:  VITALS per patient if applicable:  GENERAL: alert, oriented, appears well and in no acute distress  HEENT: atraumatic, conjunttiva clear, no obvious abnormalities on inspection of external nose and ears  NECK: normal movements of the head and neck  LUNGS: on inspection no signs of respiratory distress, breathing rate appears normal, no obvious gross SOB, gasping or wheezing  CV: no obvious cyanosis  MS: moves all visible extremities without noticeable abnormality  PSYCH/NEURO: pleasant and cooperative, no obvious depression or anxiety, speech and thought processing grossly intact  ASSESSMENT AND PLAN:  Discussed the following assessment and plan:  Chronic low back pain, unspecified back pain laterality, unspecified whether sciatica present - Plan: cyclobenzaprine (FLEXERIL) 5 MG  tablet, oxyCODONE-acetaminophen (PERCOCET) 5-325 MG tablet, DG Lumbar Spine Complete, Xray low back and left hip   Left hip pain - Plan: cyclobenzaprine (FLEXERIL) 5 MG tablet, oxyCODONE-acetaminophen (PERCOCET) 5-325 MG tablet, DG Hip Unilat W OR W/O Pelvis 2-3 Views Left,  Lumbar radiculopathy - Plan: DG Lumbar Spine Complete, DG Hip Unilat W OR W/O Pelvis 2-3 Views Left See meds above   Hold steroids for now  Consider MRI in the future  PT did not help in the past      I discussed the assessment and treatment plan with the patient. The patient was provided an opportunity to ask questions and all were answered. The patient agreed with the plan and demonstrated an understanding of the instructions.   The patient was advised to call back  or seek an in-person evaluation if the symptoms worsen or if the condition fails to improve as anticipated.  Time spent 15 minute s Delorise Jackson, MD

## 2018-12-24 NOTE — Telephone Encounter (Signed)
Copied from [place] [id number]. Topic: General - Inquiry >> Dec 24, 2018 11:18 AM Yvette Rack wrote: Reason for CRM: Pt stated she needs a doctor's note for today's visit. Pt stated she has to come in later today for x-ray so she can pick it up then or it can be sent to [facility].

## 2019-01-20 ENCOUNTER — Encounter: Payer: Self-pay | Admitting: Internal Medicine

## 2019-01-20 ENCOUNTER — Ambulatory Visit (INDEPENDENT_AMBULATORY_CARE_PROVIDER_SITE_OTHER): Payer: Managed Care, Other (non HMO) | Admitting: Internal Medicine

## 2019-01-20 ENCOUNTER — Other Ambulatory Visit: Payer: Self-pay

## 2019-01-20 DIAGNOSIS — R197 Diarrhea, unspecified: Secondary | ICD-10-CM | POA: Diagnosis not present

## 2019-01-20 DIAGNOSIS — Z20828 Contact with and (suspected) exposure to other viral communicable diseases: Secondary | ICD-10-CM | POA: Diagnosis not present

## 2019-01-20 DIAGNOSIS — R51 Headache: Secondary | ICD-10-CM | POA: Diagnosis not present

## 2019-01-20 DIAGNOSIS — R519 Headache, unspecified: Secondary | ICD-10-CM

## 2019-01-20 DIAGNOSIS — R110 Nausea: Secondary | ICD-10-CM

## 2019-01-20 DIAGNOSIS — Z20822 Contact with and (suspected) exposure to covid-19: Secondary | ICD-10-CM

## 2019-01-20 MED ORDER — PROMETHAZINE HCL 25 MG PO TABS: 12.5000 mg | ORAL_TABLET | Freq: Three times a day (TID) | ORAL | 0 refills | Status: DC | PRN

## 2019-01-20 NOTE — Progress Notes (Signed)
Virtual Visit via Video Note  I connected with Paula Garrett   on 01/20/19 at  2:15 PM EDT by a video enabled telemedicine application and verified that I am speaking with the correct person using two identifiers.  Location patient: home Location provider:work or home office Persons participating in the virtual visit: patient, provider  I discussed the limitations of evaluation and management by telemedicine and the availability of in person appointments. The patient expressed understanding and agreed to proceed.   HPI: 1. Sunday c/o stomach virus sx's and Monday with diarrhea Monday watery loose projective x 10 x and abdominal cramps and leg pain. She has had h/a, sweats and chills as well. No fever, no sick contacts. H/a x 3 days with nausea nothing tried other than gatorade zero no diarrhea since Monday but has really not been eating. She did go to [place] last Friday with friends in [place] and has not been wearing a mask at work. No recent Abx    ROS: See pertinent positives and negatives per HPI.  Past Medical History:  Diagnosis Date  . Anxiety   . Arthritis   . Asthma   . Depression   . GERD (gastroesophageal reflux disease)   . Hypertension   . Hypothyroidism   . Pneumonia   . Thyroid disease     Past Surgical History:  Procedure Laterality Date  . ABLATION  2006  . BREAST CYST ASPIRATION Left 1998  . BREAST SURGERY     cyst aspiration  . BREAST SURGERY  1998   cyst aspiration  . COLONOSCOPY  03/2014  . ENDOMETRIAL ABLATION    . ESOPHAGOGASTRODUODENOSCOPY (EGD) WITH PROPOFOL N/A 08/15/2015   Procedure: ESOPHAGOGASTRODUODENOSCOPY (EGD) WITH PROPOFOL;  Surgeon: Lollie Sails, MD;  Location: [facility] ENDOSCOPY;  Service: Endoscopy;  Laterality: N/A;  . ESOPHAGOGASTRODUODENOSCOPY (EGD) WITH PROPOFOL N/A 01/22/2016   Procedure: ESOPHAGOGASTRODUODENOSCOPY (EGD) WITH PROPOFOL;  Surgeon: Lollie Sails, MD;  Location: [facility] ENDOSCOPY;  Service: Endoscopy;  Laterality: N/A;   . GASTRIC BYPASS  2005  . HERNIA REPAIR    . NASAL SINUS SURGERY    . TOTAL THYROIDECTOMY    . TUBAL LIGATION      Family History  Problem Relation Age of Onset  . Heart disease Mother   . COPD Mother   . Arthritis Mother   . Cancer Mother        uterine  . Lupus Mother   . Anxiety disorder Mother   . Depression Mother   . Drug abuse Mother   . COPD Father   . Arthritis Father   . Heart disease Father   . Heart attack Father   . Alcohol abuse Father   . Heart disease Brother   . Heart attack Brother   . Drug abuse Brother   . Anxiety disorder Brother   . Depression Brother   . Heart disease Brother   . Cancer Brother        liver cancer age 44 y.o  . Diabetes Maternal Grandmother   . Diabetes Maternal Grandfather   . Diabetes Paternal Grandmother   . Diabetes Paternal Grandfather     SOCIAL HX:  Lives in [place] single, divorced   Work - [employer]  Diet - healthy diet Exercise - limited Caffeine use: daily   Current Outpatient Medications:  .  albuterol (PROVENTIL HFA;VENTOLIN HFA) 108 (90 Base) MCG/ACT inhaler, Inhale 2 puffs into the lungs every 6 (six) hours as needed for wheezing or shortness of breath.,  Disp: 1 Inhaler, Rfl: 11 .  amLODipine (NORVASC) 2.5 MG tablet, Take 1 tablet (2.5 mg total) by mouth daily., Disp: 90 tablet, Rfl: 3 .  budesonide-formoterol (SYMBICORT) 80-4.5 MCG/ACT inhaler, Inhale 2 puffs into the lungs 2 (two) times daily. Rinse mouth, Disp: 1 Inhaler, Rfl: 11 .  cetirizine (ZYRTEC) 10 MG tablet, Take 1 tablet (10 mg total) by mouth daily., Disp: 90 tablet, Rfl: 3 .  Cholecalciferol 1.25 MG (50000 UT) capsule, Take 1 capsule (50,000 Units total) by mouth once a week., Disp: 13 capsule, Rfl: 1 .  clonazePAM (KLONOPIN) 1 MG tablet, Take 1 tablet (1 mg total) by mouth at bedtime as needed for anxiety., Disp: 30 tablet, Rfl: 5 .  Cyanocobalamin (B-12) 1000 MCG SUBL, Place 1 tablet under the tongue daily., Disp: 30 each, Rfl:  .   cyclobenzaprine (FLEXERIL) 5 MG tablet, Take 1-2 tablets (5-10 mg total) by mouth 2 (two) times daily as needed for muscle spasms., Disp: 40 tablet, Rfl: 2 .  Insulin Pen Needle (PEN NEEDLES) 31G X 8 MM MISC, 1 Device by Does not apply route daily., Disp: 90 each, Rfl: 3 .  lisinopril-hydrochlorothiazide (ZESTORETIC) 20-12.5 MG tablet, Take 2 tablets by mouth daily., Disp: 180 tablet, Rfl: 3 .  montelukast (SINGULAIR) 10 MG tablet, Take 1 tablet (10 mg total) by mouth at bedtime., Disp: 90 tablet, Rfl: 3 .  oxyCODONE-acetaminophen (PERCOCET) 5-325 MG tablet, Take 1 tablet by mouth every 8 (eight) hours as needed for severe pain., Disp: 15 tablet, Rfl: 0 .  pantoprazole (PROTONIX) 40 MG tablet, Take 1 tablet (40 mg total) by mouth daily. In am 30 min before food, Disp: 90 tablet, Rfl: 3 .  SYNTHROID 200 MCG tablet, TAKE 1 TABLET BY MOUTH ONCE daily before breakfast 30 minutes  Further refills from [facility] clinic endocrine Dr. Honor Junes, Marcello Moores no exceptions. Call to make appt endocrine, Disp: 90 tablet, Rfl: 3 .  venlafaxine XR (EFFEXOR-XR) 150 MG 24 hr capsule, Take 1 capsule (150 mg total) by mouth daily with breakfast., Disp: 90 capsule, Rfl: 3 .  promethazine (PHENERGAN) 25 MG tablet, Take 0.5-1 tablets (12.5-25 mg total) by mouth every 8 (eight) hours as needed for nausea or vomiting., Disp: 40 tablet, Rfl: 0  EXAM:  VITALS per patient if applicable:  GENERAL: alert, oriented, appears well and in no acute distress  HEENT: atraumatic, conjunttiva clear, no obvious abnormalities on inspection of external nose and ears  NECK: normal movements of the head and neck  LUNGS: on inspection no signs of respiratory distress, breathing rate appears normal, no obvious gross SOB, gasping or wheezing  CV: no obvious cyanosis  MS: moves all visible extremities without noticeable abnormality  PSYCH/NEURO: pleasant and cooperative, no obvious depression or anxiety, speech and thought processing grossly  intact  ASSESSMENT AND PLAN:  Discussed the following assessment and plan:  Nausea - Plan: promethazine (PHENERGAN) 25 MG tablet, Novel Coronavirus, NAA ([facility])  Brat diet  Call back if not better will do GI path panel and C diff   Diarrhea, unspecified type - Plan: Novel Coronavirus, NAA ([facility]),  Also rec peptobismol otc   Nonintractable headache, unspecified chronicity pattern, unspecified headache type - Plan: Novel Coronavirus, NAA ([facility]) Prn tylenol   Close Exposure to Covid-19 Virus - Plan: Novel Coronavirus, NAA ([facility]) Note for work to be off until results come back      I discussed the assessment and treatment plan with the patient. The patient was provided an opportunity to ask questions and  all were answered. The patient agreed with the plan and demonstrated an understanding of the instructions.   The patient was advised to call back or seek an in-person evaluation if the symptoms worsen or if the condition fails to improve as anticipated.  Time spent 15 minutes   Delorise Jackson, MD

## 2019-01-21 ENCOUNTER — Other Ambulatory Visit: Payer: Self-pay

## 2019-01-21 DIAGNOSIS — Z20822 Contact with and (suspected) exposure to covid-19: Secondary | ICD-10-CM

## 2019-01-22 LAB — NOVEL CORONAVIRUS, NAA: SARS-CoV-2, NAA: NOT DETECTED

## 2019-02-07 IMAGING — MG MM DIGITAL SCREENING BILAT W/ TOMO W/ CAD
8 series · 8 of 24 positions shown · non-contrast
Comparison: Previous exam(s).

CLINICAL DATA: Screening.

EXAM:
DIGITAL SCREENING BILATERAL MAMMOGRAM WITH TOMO AND CAD

[R CC synth-2D]
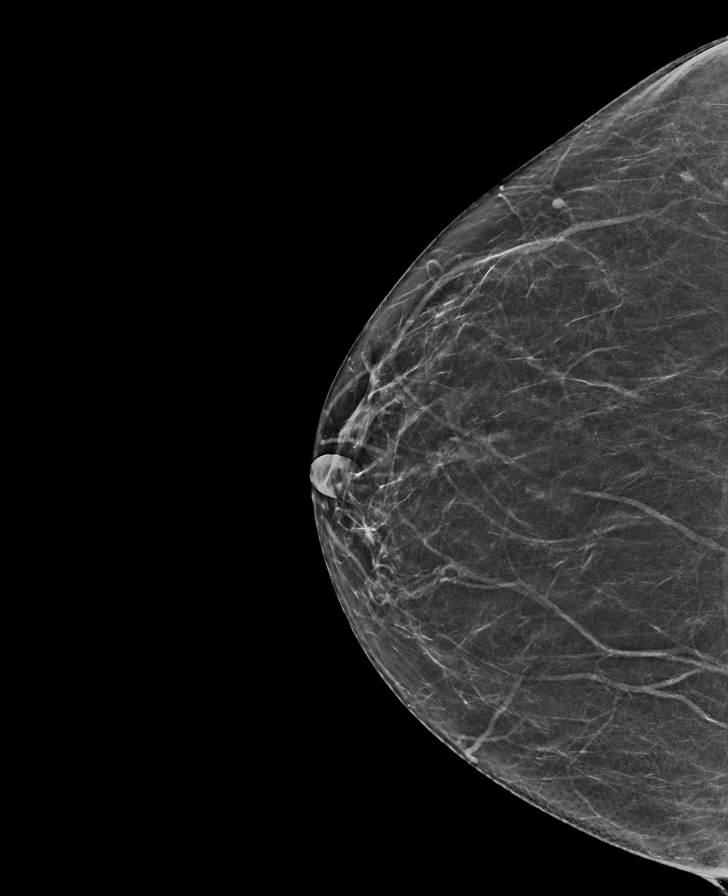

[R MLO synth-2D]
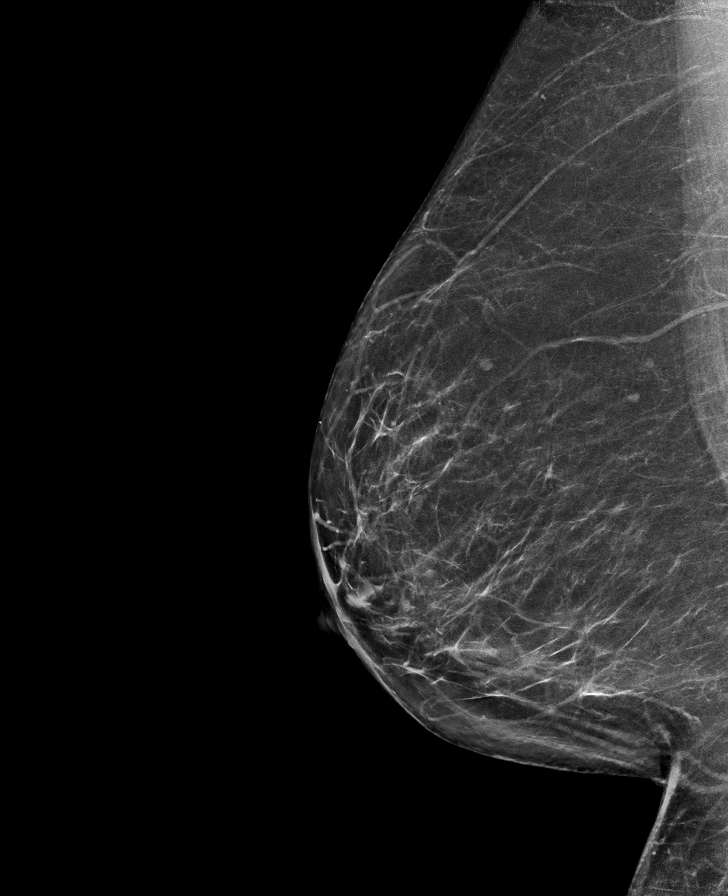

[L CC synth-2D]
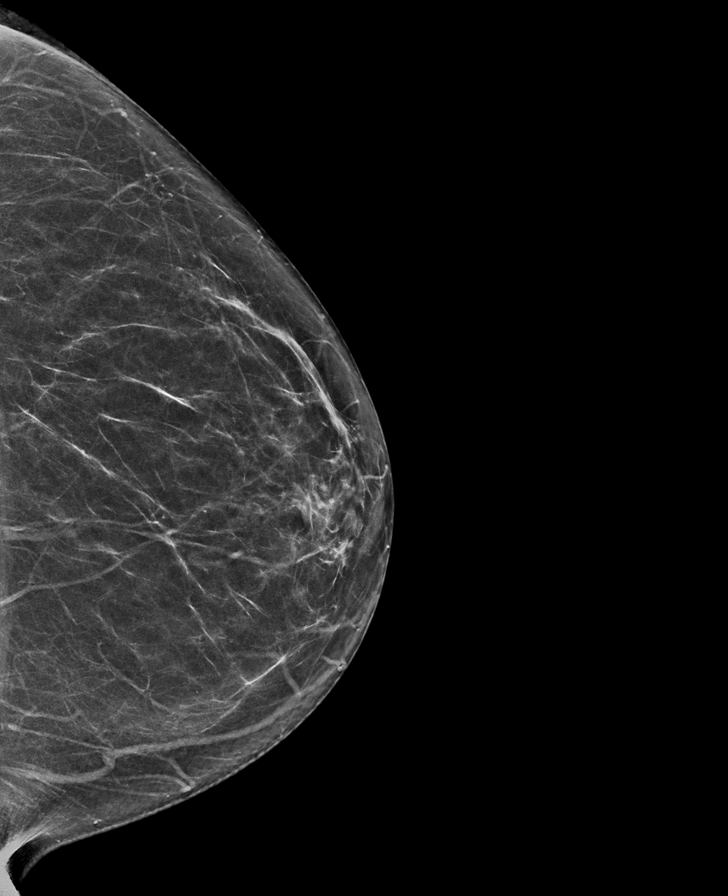

[L MLO synth-2D]
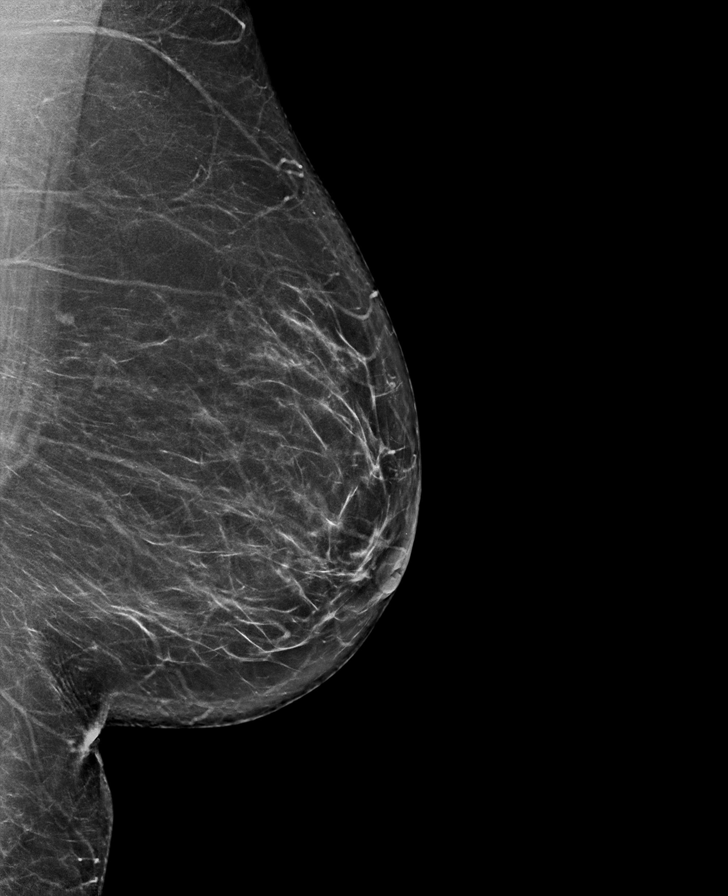

[L CC tomo · tomo slice 34/67.0]
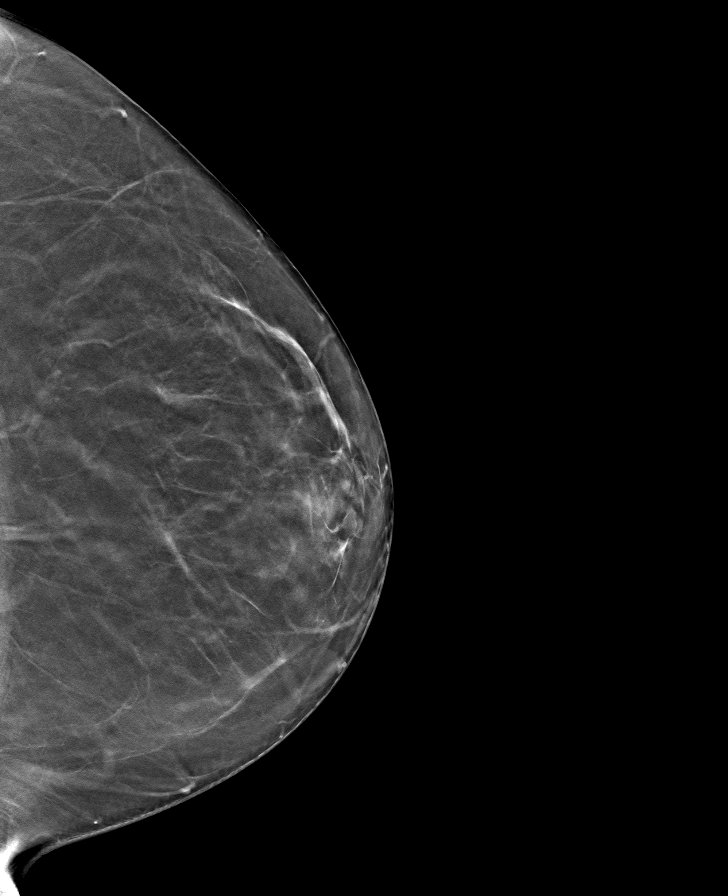

[R MLO tomo · tomo slice 35/70.0]
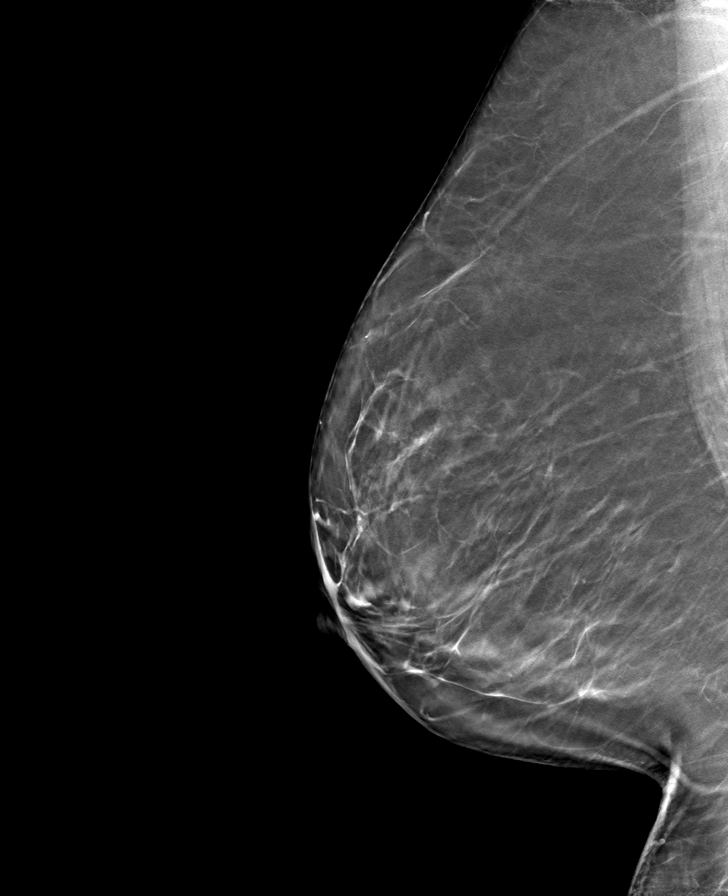

[R CC tomo · tomo slice 31/61.0]
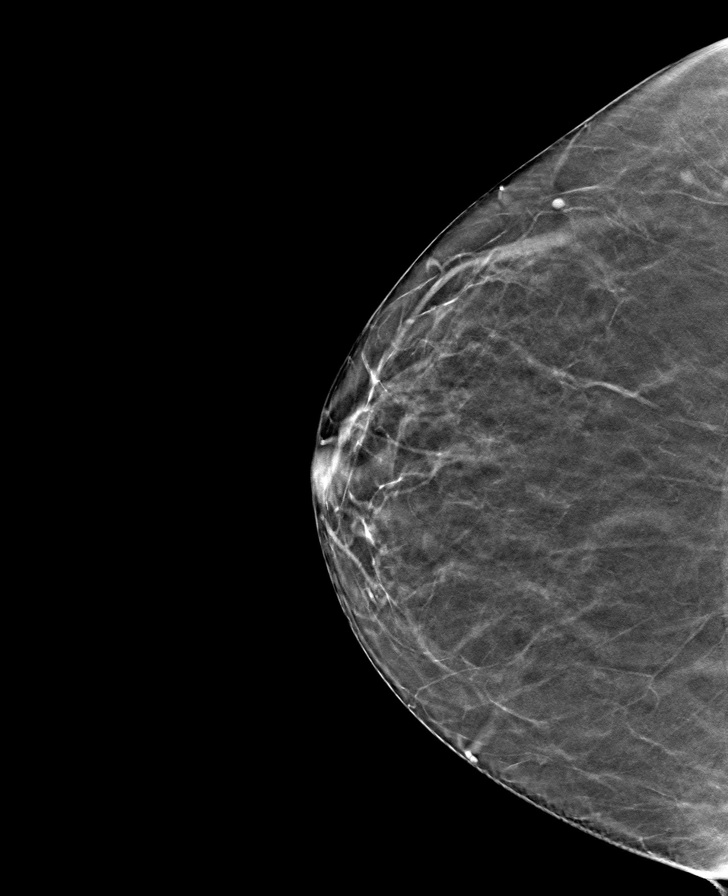

[L MLO tomo · tomo slice 39/76.0]
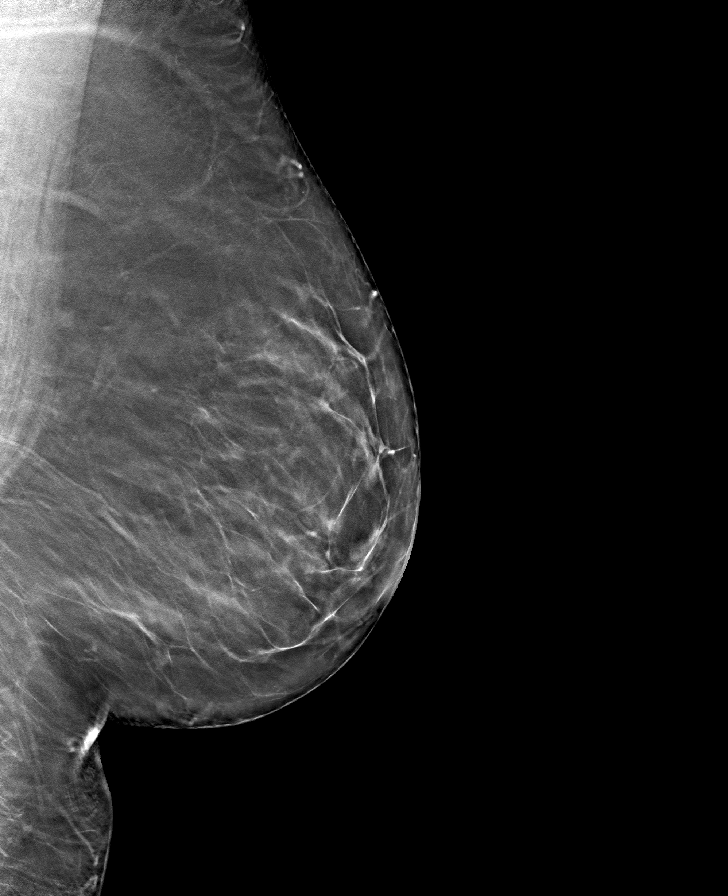

[8 of 24 positions shown; findings below may reference images not displayed]

ACR Breast Density Category b: There are scattered areas of
fibroglandular density.
FINDINGS: There are no findings suspicious for malignancy. Images were
processed with CAD.
IMPRESSION: No mammographic evidence of malignancy. A result letter of this
screening mammogram will be mailed directly to the patient.

RECOMMENDATION:
Screening mammogram in one year. (Code:[id number])

BI-RADS CATEGORY  1: Negative.

## 2019-03-10 ENCOUNTER — Telehealth: Payer: Self-pay

## 2019-03-10 ENCOUNTER — Ambulatory Visit (INDEPENDENT_AMBULATORY_CARE_PROVIDER_SITE_OTHER): Payer: Managed Care, Other (non HMO) | Admitting: Internal Medicine

## 2019-03-10 ENCOUNTER — Other Ambulatory Visit: Payer: Self-pay

## 2019-03-10 VITALS — Ht 62.0 in | Wt 218.2 lb

## 2019-03-10 DIAGNOSIS — E785 Hyperlipidemia, unspecified: Secondary | ICD-10-CM

## 2019-03-10 DIAGNOSIS — I10 Essential (primary) hypertension: Secondary | ICD-10-CM

## 2019-03-10 DIAGNOSIS — E559 Vitamin D deficiency, unspecified: Secondary | ICD-10-CM | POA: Diagnosis not present

## 2019-03-10 DIAGNOSIS — Z9884 Bariatric surgery status: Secondary | ICD-10-CM

## 2019-03-10 DIAGNOSIS — R110 Nausea: Secondary | ICD-10-CM

## 2019-03-10 DIAGNOSIS — E538 Deficiency of other specified B group vitamins: Secondary | ICD-10-CM

## 2019-03-10 DIAGNOSIS — K2270 Barrett's esophagus without dysplasia: Secondary | ICD-10-CM | POA: Diagnosis not present

## 2019-03-10 DIAGNOSIS — R7303 Prediabetes: Secondary | ICD-10-CM

## 2019-03-10 DIAGNOSIS — E039 Hypothyroidism, unspecified: Secondary | ICD-10-CM

## 2019-03-10 NOTE — Progress Notes (Signed)
Virtual Visit via Video Note  I connected with Tressa Busman  on 03/10/19 at  9:00 AM EDT by a video enabled telemedicine application and verified that I am speaking with the correct person using two identifiers.  Location patient: home Location provider:work or home office Persons participating in the virtual visit: patient, provider  I discussed the limitations of evaluation and management by telemedicine and the availability of in person appointments. The patient expressed understanding and agreed to proceed.   HPI: 1. HTN taking her meds but not checking BP at home on lis-hct 2 pills 20.12.5 and norvasc 2.5  2. Hypothyroidism on synthyroid 200 mcg qd  3. C/o nausea w/o vomitting s/p gastric bypass with h/o barretts. She still has GB and is not bothered by food 4. Arthritis right shoulder noted 2008 mild AC deg changes pain 4-5/10 and b/l knees and low back at times declines Xrays for now      ROS: See pertinent positives and negatives per HPI.  Past Medical History:  Diagnosis Date  . Anxiety   . Arthritis   . Asthma   . Depression   . GERD (gastroesophageal reflux disease)   . Hypertension   . Hypothyroidism   . Pneumonia   . Thyroid disease     Past Surgical History:  Procedure Laterality Date  . ABLATION  2006  . BREAST CYST ASPIRATION Left 1998  . BREAST SURGERY     cyst aspiration  . BREAST SURGERY  1998   cyst aspiration  . COLONOSCOPY  03/2014  . ENDOMETRIAL ABLATION    . ESOPHAGOGASTRODUODENOSCOPY (EGD) WITH PROPOFOL N/A 08/15/2015   Procedure: ESOPHAGOGASTRODUODENOSCOPY (EGD) WITH PROPOFOL;  Surgeon: Lollie Sails, MD;  Location: [facility] ENDOSCOPY;  Service: Endoscopy;  Laterality: N/A;  . ESOPHAGOGASTRODUODENOSCOPY (EGD) WITH PROPOFOL N/A 01/22/2016   Procedure: ESOPHAGOGASTRODUODENOSCOPY (EGD) WITH PROPOFOL;  Surgeon: Lollie Sails, MD;  Location: [facility] ENDOSCOPY;  Service: Endoscopy;  Laterality: N/A;  . GASTRIC BYPASS  2005  . HERNIA REPAIR    .  NASAL SINUS SURGERY    . TOTAL THYROIDECTOMY    . TUBAL LIGATION      Family History  Problem Relation Age of Onset  . Heart disease Mother   . COPD Mother   . Arthritis Mother   . Cancer Mother        uterine  . Lupus Mother   . Anxiety disorder Mother   . Depression Mother   . Drug abuse Mother   . COPD Father   . Arthritis Father   . Heart disease Father   . Heart attack Father   . Alcohol abuse Father   . Heart disease Brother   . Heart attack Brother   . Drug abuse Brother   . Anxiety disorder Brother   . Depression Brother   . Heart disease Brother   . Cancer Brother        liver cancer age 36 y.o  . Diabetes Maternal Grandmother   . Diabetes Maternal Grandfather   . Diabetes Paternal Grandmother   . Diabetes Paternal Grandfather     SOCIAL HX:  Lives in [place] single, divorced   Work - [employer] center    Current Outpatient Medications:  .  albuterol (PROVENTIL HFA;VENTOLIN HFA) 108 (90 Base) MCG/ACT inhaler, Inhale 2 puffs into the lungs every 6 (six) hours as needed for wheezing or shortness of breath., Disp: 1 Inhaler, Rfl: 11 .  amLODipine (NORVASC) 2.5 MG tablet, Take 1 tablet (2.5 mg total) by  mouth daily., Disp: 90 tablet, Rfl: 3 .  budesonide-formoterol (SYMBICORT) 80-4.5 MCG/ACT inhaler, Inhale 2 puffs into the lungs 2 (two) times daily. Rinse mouth, Disp: 1 Inhaler, Rfl: 11 .  cetirizine (ZYRTEC) 10 MG tablet, Take 1 tablet (10 mg total) by mouth daily., Disp: 90 tablet, Rfl: 3 .  Cholecalciferol 1.25 MG (50000 UT) capsule, Take 1 capsule (50,000 Units total) by mouth once a week., Disp: 13 capsule, Rfl: 1 .  clonazePAM (KLONOPIN) 1 MG tablet, Take 1 tablet (1 mg total) by mouth at bedtime as needed for anxiety., Disp: 30 tablet, Rfl: 5 .  Cyanocobalamin (B-12) 1000 MCG SUBL, Place 1 tablet under the tongue daily., Disp: 30 each, Rfl:  .  cyclobenzaprine (FLEXERIL) 5 MG tablet, Take 1-2 tablets (5-10 mg total) by mouth 2 (two) times daily as needed for  muscle spasms., Disp: 40 tablet, Rfl: 2 .  Insulin Pen Needle (PEN NEEDLES) 31G X 8 MM MISC, 1 Device by Does not apply route daily., Disp: 90 each, Rfl: 3 .  lisinopril-hydrochlorothiazide (ZESTORETIC) 20-12.5 MG tablet, Take 2 tablets by mouth daily., Disp: 180 tablet, Rfl: 3 .  montelukast (SINGULAIR) 10 MG tablet, Take 1 tablet (10 mg total) by mouth at bedtime., Disp: 90 tablet, Rfl: 3 .  oxyCODONE-acetaminophen (PERCOCET) 5-325 MG tablet, Take 1 tablet by mouth every 8 (eight) hours as needed for severe pain., Disp: 15 tablet, Rfl: 0 .  pantoprazole (PROTONIX) 40 MG tablet, Take 1 tablet (40 mg total) by mouth daily. In am 30 min before food, Disp: 90 tablet, Rfl: 3 .  promethazine (PHENERGAN) 25 MG tablet, Take 0.5-1 tablets (12.5-25 mg total) by mouth every 8 (eight) hours as needed for nausea or vomiting., Disp: 40 tablet, Rfl: 0 .  SYNTHROID 200 MCG tablet, TAKE 1 TABLET BY MOUTH ONCE daily before breakfast 30 minutes  Further refills from [facility] clinic endocrine Dr. Honor Junes, Marcello Moores no exceptions. Call to make appt endocrine, Disp: 90 tablet, Rfl: 3 .  venlafaxine XR (EFFEXOR-XR) 150 MG 24 hr capsule, Take 1 capsule (150 mg total) by mouth daily with breakfast., Disp: 90 capsule, Rfl: 3  EXAM:  VITALS per patient if applicable:  GENERAL: alert, oriented, appears well and in no acute distress  HEENT: atraumatic, conjunttiva clear, no obvious abnormalities on inspection of external nose and ears  NECK: normal movements of the head and neck  LUNGS: on inspection no signs of respiratory distress, breathing rate appears normal, no obvious gross SOB, gasping or wheezing  CV: no obvious cyanosis  MS: moves all visible extremities without noticeable abnormality  PSYCH/NEURO: pleasant and cooperative, no obvious depression or anxiety, speech and thought processing grossly intact  ASSESSMENT AND PLAN:  Discussed the following assessment and plan:  Barrett's esophagus without  dysplasia - Plan: Ambulatory referral to Gastroenterology needs EGD refer Dr. Gustavo Lah  H/O gastric bypass - Plan: Ambulatory referral to Gastroenterology  Nausea r/o GB disease - Plan: US Abdomen Complete, Ambulatory referral to Gastroenterology  Vitamin D deficiency - Plan: Vitamin D (25 hydroxy)  Essential hypertension - Plan: Comprehensive metabolic panel, CBC with Differential/Platelet, Lipid panel -rec buy bP cuff to monitor cont meds   B12 deficiency - Plan: B12  Hyperlipidemia, unspecified hyperlipidemia type rec healthy diet and exercise   Prediabetes - Plan: Hemoglobin A1c  Hypothyroidism, unspecified type - Plan: TSH Cont meds   HM Flu shothad10/14/19 pna 23 had 03/2012 due at f/uin future Tdap UTD10/25/13 rec hepBvaccinefor future Consider MMR in future Check fasting labs  mammo10/15/19 negordered needs to be sch Papneg 11/20/17 no HPV EGD/colonoscopy had [facility] , need to get report colonoscopy 10/10/15[facility] Also EGD due 01/22/2019 barretts last 01/21/14 no dysplasia -referred today  rec smoking cesasationand exercise to lose with healthy diet choices  -we discussed possible serious and likely etiologies, options for evaluation and workup, limitations of telemedicine visit vs in person visit, treatment, treatment risks and precautions. Pt prefers to treat via telemedicine empirically rather then risking or undertaking an in person visit at this moment. Patient agrees to seek prompt in person care if worsening, new symptoms arise, or if is not improving with treatment.   I discussed the assessment and treatment plan with the patient. The patient was provided an opportunity to ask questions and all were answered. The patient agreed with the plan and demonstrated an understanding of the instructions.   The patient was advised to call back or seek an in-person evaluation if the symptoms worsen or if the condition fails to improve as anticipated.  Time  spent 25 minutes  Delorise Jackson, MD

## 2019-03-10 NOTE — Telephone Encounter (Signed)
Copied from [place] [id number]. Topic: Appointment Scheduling - Scheduling Inquiry for Clinic >> Mar 10, 2019  7:54 AM Scherrie Gerlach wrote: Reason for CRM: pt aware virtual visit this am. Pt called 7:50 am.  Unable to reach office. I did document she is aware in notes.  Pt has done virtual w/ dr tracey before, and knows what to do.

## 2019-03-17 ENCOUNTER — Ambulatory Visit: Payer: Managed Care, Other (non HMO)

## 2019-03-19 ENCOUNTER — Other Ambulatory Visit: Payer: Managed Care, Other (non HMO)

## 2019-03-25 ENCOUNTER — Other Ambulatory Visit: Payer: Self-pay

## 2019-03-25 ENCOUNTER — Other Ambulatory Visit (INDEPENDENT_AMBULATORY_CARE_PROVIDER_SITE_OTHER): Payer: Managed Care, Other (non HMO)

## 2019-03-25 DIAGNOSIS — E538 Deficiency of other specified B group vitamins: Secondary | ICD-10-CM

## 2019-03-25 DIAGNOSIS — R7303 Prediabetes: Secondary | ICD-10-CM

## 2019-03-25 DIAGNOSIS — E559 Vitamin D deficiency, unspecified: Secondary | ICD-10-CM | POA: Diagnosis not present

## 2019-03-25 DIAGNOSIS — E039 Hypothyroidism, unspecified: Secondary | ICD-10-CM | POA: Diagnosis not present

## 2019-03-25 DIAGNOSIS — I10 Essential (primary) hypertension: Secondary | ICD-10-CM

## 2019-03-25 LAB — LIPID PANEL
Cholesterol: 218 mg/dL — ABNORMAL HIGH (ref 0–200)
HDL: 96.8 mg/dL (ref >39.00)
LDL Cholesterol: 94 mg/dL (ref 0–99)
NonHDL: 120.95
Total CHOL/HDL Ratio: 2
Triglycerides: 136 mg/dL (ref 0.0–149.0)
VLDL: 27.2 mg/dL (ref 0.0–40.0)

## 2019-03-25 LAB — COMPREHENSIVE METABOLIC PANEL
ALT: 16 U/L (ref 0–35)
AST: 15 U/L (ref 0–37)
Albumin: 3.2 g/dL — ABNORMAL LOW (ref 3.5–5.2)
Alkaline Phosphatase: 51 U/L (ref 39–117)
BUN: 15 mg/dL (ref 6–23)
CO2: 27 mEq/L (ref 19–32)
Calcium: 9.9 mg/dL (ref 8.4–10.5)
Chloride: 101 mEq/L (ref 96–112)
Creatinine, Ser: 0.71 mg/dL (ref 0.40–1.20)
GFR: 85.3 mL/min (ref >60.00)
Glucose, Bld: 85 mg/dL (ref 70–99)
Potassium: 4.4 mEq/L (ref 3.5–5.1)
Sodium: 137 mEq/L (ref 135–145)
Total Bilirubin: 0.3 mg/dL (ref 0.2–1.2)
Total Protein: 7.8 g/dL (ref 6.0–8.3)

## 2019-03-25 LAB — VITAMIN D 25 HYDROXY (VIT D DEFICIENCY, FRACTURES): VITD: 34.8 ng/mL (ref 30.00–100.00)

## 2019-03-25 LAB — HEMOGLOBIN A1C: Hgb A1c MFr Bld: 5.9 % (ref 4.6–6.5)

## 2019-03-25 LAB — TSH: TSH: 2.63 u[IU]/mL (ref 0.35–4.50)

## 2019-03-25 LAB — VITAMIN B12: Vitamin B-12: 287 pg/mL (ref 211–911)

## 2019-03-26 LAB — CBC WITH DIFFERENTIAL/PLATELET
Basophils Absolute: 0 10*3/uL (ref 0.0–0.1)
Basophils Relative: 0.8 % (ref 0.0–3.0)
Eosinophils Absolute: 0 10*3/uL (ref 0.0–0.7)
Eosinophils Relative: 1 % (ref 0.0–5.0)
HCT: 39 % (ref 36.0–46.0)
Hemoglobin: 13.1 g/dL (ref 12.0–15.0)
Lymphocytes Relative: 51.5 % — ABNORMAL HIGH (ref 12.0–46.0)
Lymphs Abs: 2.4 10*3/uL (ref 0.7–4.0)
MCHC: 33.5 g/dL (ref 30.0–36.0)
MCV: 95.7 fl (ref 78.0–100.0)
Monocytes Absolute: 0.4 10*3/uL (ref 0.1–1.0)
Monocytes Relative: 9.3 % (ref 3.0–12.0)
Neutro Abs: 1.7 10*3/uL (ref 1.4–7.7)
Neutrophils Relative %: 37.4 % — ABNORMAL LOW (ref 43.0–77.0)
Platelets: 255 10*3/uL (ref 150.0–400.0)
RBC: 4.08 Mil/uL (ref 3.87–5.11)
RDW: 14.5 % (ref 11.5–15.5)
WBC: 4.6 10*3/uL (ref 4.0–10.5)

## 2019-04-06 ENCOUNTER — Other Ambulatory Visit: Payer: Self-pay

## 2019-04-06 ENCOUNTER — Ambulatory Visit (INDEPENDENT_AMBULATORY_CARE_PROVIDER_SITE_OTHER): Payer: Managed Care, Other (non HMO) | Admitting: Family Medicine

## 2019-04-06 DIAGNOSIS — R05 Cough: Secondary | ICD-10-CM

## 2019-04-06 DIAGNOSIS — Z20828 Contact with and (suspected) exposure to other viral communicable diseases: Secondary | ICD-10-CM

## 2019-04-06 DIAGNOSIS — R0981 Nasal congestion: Secondary | ICD-10-CM

## 2019-04-06 DIAGNOSIS — R059 Cough, unspecified: Secondary | ICD-10-CM

## 2019-04-06 DIAGNOSIS — Z20822 Contact with and (suspected) exposure to covid-19: Secondary | ICD-10-CM

## 2019-04-06 MED ORDER — AMOXICILLIN-POT CLAVULANATE 875-125 MG PO TABS: 1.0000 | ORAL_TABLET | Freq: Two times a day (BID) | ORAL | 0 refills | Status: DC

## 2019-04-06 NOTE — Progress Notes (Signed)
Patient ID: Paula Garrett, female   DOB: 12/04/1965, 53 y.o.   MRN: [id number]    Virtual Visit via video Note  This visit type was conducted due to national recommendations for restrictions regarding the COVID-19 pandemic (e.g. social distancing).  This format is felt to be most appropriate for this patient at this time.  All issues noted in this document were discussed and addressed.  No physical exam was performed (except for noted visual exam findings with Video Visits).   I connected with Paula Garrett today at  3:20 PM EDT by a video enabled telemedicine application and verified that I am speaking with the correct person using two identifiers. Location patient: home Location provider: work or home office Persons participating in the virtual visit: patient, provider  I discussed the limitations, risks, security and privacy concerns of performing an evaluation and management service by video and the availability of in person appointments. I also discussed with the patient that there may be a patient responsible charge related to this service. The patient expressed understanding and agreed to proceed.   HPI:  Patient and I connected via video due to complaint of sinus congestion, cough and feeling rundown for a little over a week.  Patient states she has not been exposed to anyone who has been diagnosed with COVID-19.  States she does not believe she has gone anywhere that she could have been exposed, but also reports she did travel to the beach last week with her family.  Has been using Mucinex to help break up the cough with some effect.  Denies fever or chills.  Denies vomiting or diarrhea.  Denies body aches.  Denies chest pain.  Appetite normal.   ROS: See pertinent positives and negatives per HPI.  Past Medical History:  Diagnosis Date  . Anxiety   . Arthritis   . Asthma   . Depression   . GERD (gastroesophageal reflux disease)   . Hypertension   . Hypothyroidism   . Pneumonia    . Thyroid disease     Past Surgical History:  Procedure Laterality Date  . ABLATION  2006  . BREAST CYST ASPIRATION Left 1998  . BREAST SURGERY     cyst aspiration  . BREAST SURGERY  1998   cyst aspiration  . COLONOSCOPY  03/2014  . ENDOMETRIAL ABLATION    . ESOPHAGOGASTRODUODENOSCOPY (EGD) WITH PROPOFOL N/A 08/15/2015   Procedure: ESOPHAGOGASTRODUODENOSCOPY (EGD) WITH PROPOFOL;  Surgeon: Lollie Sails, MD;  Location: [facility] ENDOSCOPY;  Service: Endoscopy;  Laterality: N/A;  . ESOPHAGOGASTRODUODENOSCOPY (EGD) WITH PROPOFOL N/A 01/22/2016   Procedure: ESOPHAGOGASTRODUODENOSCOPY (EGD) WITH PROPOFOL;  Surgeon: Lollie Sails, MD;  Location: [facility] ENDOSCOPY;  Service: Endoscopy;  Laterality: N/A;  . GASTRIC BYPASS  2005  . HERNIA REPAIR    . NASAL SINUS SURGERY    . TOTAL THYROIDECTOMY    . TUBAL LIGATION      Family History  Problem Relation Age of Onset  . Heart disease Mother   . COPD Mother   . Arthritis Mother   . Cancer Mother        uterine  . Lupus Mother   . Anxiety disorder Mother   . Depression Mother   . Drug abuse Mother   . COPD Father   . Arthritis Father   . Heart disease Father   . Heart attack Father   . Alcohol abuse Father   . Heart disease Brother   . Heart attack Brother   . Drug  abuse Brother   . Anxiety disorder Brother   . Depression Brother   . Heart disease Brother   . Cancer Brother        liver cancer age 57 y.o  . Diabetes Maternal Grandmother   . Diabetes Maternal Grandfather   . Diabetes Paternal Grandmother   . Diabetes Paternal Grandfather     Social History   Tobacco Use  . Smoking status: Current Every Day Smoker    Packs/day: 1.00    Years: 30.00    Pack years: 30.00    Types: Cigarettes    Start date: 03/15/1980  . Smokeless tobacco: Never Used  Substance Use Topics  . Alcohol use: Yes    Alcohol/week: 10.0 - 12.0 standard drinks    Types: 8 - 10 Glasses of wine, 2 Standard drinks or equivalent per week     Current Outpatient Medications:  .  albuterol (PROVENTIL HFA;VENTOLIN HFA) 108 (90 Base) MCG/ACT inhaler, Inhale 2 puffs into the lungs every 6 (six) hours as needed for wheezing or shortness of breath., Disp: 1 Inhaler, Rfl: 11 .  amLODipine (NORVASC) 2.5 MG tablet, Take 1 tablet (2.5 mg total) by mouth daily., Disp: 90 tablet, Rfl: 3 .  budesonide-formoterol (SYMBICORT) 80-4.5 MCG/ACT inhaler, Inhale 2 puffs into the lungs 2 (two) times daily. Rinse mouth, Disp: 1 Inhaler, Rfl: 11 .  cetirizine (ZYRTEC) 10 MG tablet, Take 1 tablet (10 mg total) by mouth daily., Disp: 90 tablet, Rfl: 3 .  Cholecalciferol 1.25 MG (50000 UT) capsule, Take 1 capsule (50,000 Units total) by mouth once a week., Disp: 13 capsule, Rfl: 1 .  clonazePAM (KLONOPIN) 1 MG tablet, Take 1 tablet (1 mg total) by mouth at bedtime as needed for anxiety., Disp: 30 tablet, Rfl: 5 .  Cyanocobalamin (B-12) 1000 MCG SUBL, Place 1 tablet under the tongue daily., Disp: 30 each, Rfl:  .  cyclobenzaprine (FLEXERIL) 5 MG tablet, Take 1-2 tablets (5-10 mg total) by mouth 2 (two) times daily as needed for muscle spasms., Disp: 40 tablet, Rfl: 2 .  Insulin Pen Needle (PEN NEEDLES) 31G X 8 MM MISC, 1 Device by Does not apply route daily., Disp: 90 each, Rfl: 3 .  lisinopril-hydrochlorothiazide (ZESTORETIC) 20-12.5 MG tablet, Take 2 tablets by mouth daily., Disp: 180 tablet, Rfl: 3 .  montelukast (SINGULAIR) 10 MG tablet, Take 1 tablet (10 mg total) by mouth at bedtime., Disp: 90 tablet, Rfl: 3 .  oxyCODONE-acetaminophen (PERCOCET) 5-325 MG tablet, Take 1 tablet by mouth every 8 (eight) hours as needed for severe pain., Disp: 15 tablet, Rfl: 0 .  pantoprazole (PROTONIX) 40 MG tablet, Take 1 tablet (40 mg total) by mouth daily. In am 30 min before food, Disp: 90 tablet, Rfl: 3 .  promethazine (PHENERGAN) 25 MG tablet, Take 0.5-1 tablets (12.5-25 mg total) by mouth every 8 (eight) hours as needed for nausea or vomiting., Disp: 40 tablet, Rfl: 0 .   SYNTHROID 200 MCG tablet, TAKE 1 TABLET BY MOUTH ONCE daily before breakfast 30 minutes  Further refills from [facility] clinic endocrine Dr. Honor Junes, Marcello Moores no exceptions. Call to make appt endocrine, Disp: 90 tablet, Rfl: 3 .  venlafaxine XR (EFFEXOR-XR) 150 MG 24 hr capsule, Take 1 capsule (150 mg total) by mouth daily with breakfast., Disp: 90 capsule, Rfl: 3  EXAM: GENERAL: alert, oriented, appears well and in no acute distress  HEENT: atraumatic, conjunttiva clear, no obvious abnormalities on inspection of external nose and ears  NECK: normal movements of the head and  neck  LUNGS: on inspection no signs of respiratory distress, breathing rate appears normal, no obvious gross SOB, gasping or wheezing.  Coughs a few times  on video visit, cough does sound a little congested.  CV: no obvious cyanosis  MS: moves all visible extremities without noticeable abnormality  PSYCH/NEURO: pleasant and cooperative, no obvious depression or anxiety, speech and thought processing grossly intact  ASSESSMENT AND PLAN:  Discussed the following assessment and plan:  Cough, sinus congestion, possible COVID-19-advised patient that due to her symptoms I strongly recommend she be tested for COVID-19.  Patient states she knows she does not have that and refuses to be tested.  Long discussion with patient regards to COVID-19 symptoms being similar to other things like a sinus infection, congestion, upper respiratory infections, common cold viruses and many others.  Advised patient that we cannot easily determine one versus the other, that is why the testing is necessary.  Patient states because she does not have a fever she does not believe she has COVID-19.  Advised patient that fever is not a requirement to be diagnosed with COVID-19.  Again strongly recommended she be tested, she refuses pain.  Patient states she needs to go to work and cannot miss work.  Advised patient that I would assume her job requires her  to do a questionnaire every day about symptoms, and that if she were to lie about symptoms she could be putting herself and her, others in jeopardy as well as her job in jeopardy.  Patient verbalized understanding of this.  We will cover her with Augmentin to treat possible sinus infection.  Advised to continue Mucinex.  Advised to keep good fluid intake.  Again strongly recommended she be tested, she again refuses to be tested for COVID-19.   I discussed the assessment and treatment plan with the patient. The patient was provided an opportunity to ask questions and all were answered. The patient agreed with the plan and demonstrated an understanding of the instructions.   The patient was advised to call back or seek an in-person evaluation if the symptoms worsen or if the condition fails to improve as anticipated.    Jodelle Green, FNP

## 2019-04-14 ENCOUNTER — Other Ambulatory Visit: Payer: Self-pay | Admitting: Internal Medicine

## 2019-04-14 DIAGNOSIS — G4700 Insomnia, unspecified: Secondary | ICD-10-CM

## 2019-04-14 DIAGNOSIS — F419 Anxiety disorder, unspecified: Secondary | ICD-10-CM

## 2019-04-14 MED ORDER — CLONAZEPAM 1 MG PO TABS: 1.0000 mg | ORAL_TABLET | Freq: Every evening | ORAL | 5 refills | Status: DC | PRN

## 2019-07-07 ENCOUNTER — Other Ambulatory Visit: Payer: Self-pay

## 2019-07-07 ENCOUNTER — Ambulatory Visit (INDEPENDENT_AMBULATORY_CARE_PROVIDER_SITE_OTHER): Payer: Managed Care, Other (non HMO) | Admitting: Internal Medicine

## 2019-07-07 ENCOUNTER — Encounter: Payer: Self-pay | Admitting: Internal Medicine

## 2019-07-07 ENCOUNTER — Telehealth: Payer: Self-pay | Admitting: Internal Medicine

## 2019-07-07 VITALS — Ht 62.0 in | Wt 218.2 lb

## 2019-07-07 DIAGNOSIS — E039 Hypothyroidism, unspecified: Secondary | ICD-10-CM

## 2019-07-07 DIAGNOSIS — I10 Essential (primary) hypertension: Secondary | ICD-10-CM

## 2019-07-07 DIAGNOSIS — K2270 Barrett's esophagus without dysplasia: Secondary | ICD-10-CM

## 2019-07-07 DIAGNOSIS — R5383 Other fatigue: Secondary | ICD-10-CM

## 2019-07-07 DIAGNOSIS — F419 Anxiety disorder, unspecified: Secondary | ICD-10-CM | POA: Diagnosis not present

## 2019-07-07 DIAGNOSIS — R768 Other specified abnormal immunological findings in serum: Secondary | ICD-10-CM

## 2019-07-07 DIAGNOSIS — F32A Depression, unspecified: Secondary | ICD-10-CM

## 2019-07-07 DIAGNOSIS — M2550 Pain in unspecified joint: Secondary | ICD-10-CM

## 2019-07-07 DIAGNOSIS — R5382 Chronic fatigue, unspecified: Secondary | ICD-10-CM

## 2019-07-07 DIAGNOSIS — F329 Major depressive disorder, single episode, unspecified: Secondary | ICD-10-CM

## 2019-07-07 DIAGNOSIS — G4700 Insomnia, unspecified: Secondary | ICD-10-CM

## 2019-07-07 DIAGNOSIS — Z720 Tobacco use: Secondary | ICD-10-CM

## 2019-07-07 MED ORDER — CLONAZEPAM 1 MG PO TABS: ORAL_TABLET | ORAL | 5 refills | Status: DC

## 2019-07-07 MED ORDER — PREDNISONE 20 MG PO TABS: 20.0000 mg | ORAL_TABLET | Freq: Every day | ORAL | 0 refills | Status: DC

## 2019-07-07 NOTE — Telephone Encounter (Signed)
Call in klonopin to [facility]   Thanks [place]

## 2019-07-07 NOTE — Progress Notes (Signed)
Virtual Visit via Video Note  I connected with Paula Garrett  on 07/07/19 at 11:50 AM EST by a video enabled telemedicine application and verified that I am speaking with the correct person using two identifiers.  Location patient: work Environmental manager or home office Persons participating in the virtual visit: patient, provider  I discussed the limitations of evaluation and management by telemedicine and the availability of in person appointments. The patient expressed understanding and agreed to proceed.   HPI: 1. C/o fatigue, multiple joints painful right elbow swollen and painful, back, hips, worse pain in shoulders. She also has pain in ankles and knees worse x 2 months. Pain limits her activities she has to give her 50 yo mom a bath and this is painful. 5/10. Tries Tylenol w/o relief cant take nsaids has also tried excedrin w/o relief  Pain limits exercise and she wants to get healthy   05/31/15  Results for LORIEL, DIEHL (MRN [id number]) as of 07/07/2019 12:46  Ref. Range 05/31/2015 11:59  Anti Nuclear Antibody (ANA) Latest Ref Range: NEGATIVE  NEG  RA Latex Turbid. Latest Ref Range: <=14 IU/mL 32 (H)   2. 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 Tobacco abuse smoking cig on video today  4. HTN no machine still will order one get coworker to check BP at work  On norvasc 2.5 mg qd, lis-hctz 20-12.5 2 pills qd  5. Hypothyroidism on synthyroid 200 mcg qd   ROS: See pertinent positives and negatives per HPI.  Past Medical History:  Diagnosis Date  . Anxiety   . Arthritis   . Asthma   . Depression   . GERD (gastroesophageal reflux disease)   . Hypertension   . Hypothyroidism   . Pneumonia   . Thyroid disease     Past Surgical History:  Procedure Laterality  Date  . ABLATION  2006  . BREAST CYST ASPIRATION Left 1998  . BREAST SURGERY     cyst aspiration  . BREAST SURGERY  1998   cyst aspiration  . COLONOSCOPY  03/2014  . ENDOMETRIAL ABLATION    . ESOPHAGOGASTRODUODENOSCOPY (EGD) WITH PROPOFOL N/A 08/15/2015   Procedure: ESOPHAGOGASTRODUODENOSCOPY (EGD) WITH PROPOFOL;  Surgeon: Lollie Sails, MD;  Location: [facility] ENDOSCOPY;  Service: Endoscopy;  Laterality: N/A;  . ESOPHAGOGASTRODUODENOSCOPY (EGD) WITH PROPOFOL N/A 01/22/2016   Procedure: ESOPHAGOGASTRODUODENOSCOPY (EGD) WITH PROPOFOL;  Surgeon: Lollie Sails, MD;  Location: [facility] ENDOSCOPY;  Service: Endoscopy;  Laterality: N/A;  . GASTRIC BYPASS  2005  . HERNIA REPAIR    . NASAL SINUS SURGERY    . TOTAL THYROIDECTOMY    . TUBAL LIGATION      Family History  Problem Relation Age of Onset  . Heart disease Mother   . COPD Mother   . Arthritis Mother   . Cancer Mother        uterine  . Lupus Mother   . Anxiety disorder Mother   . Depression Mother   . Drug abuse Mother   . COPD Father   . Arthritis Father   . Heart disease Father   . Heart attack Father   . Alcohol abuse Father   . Heart disease Brother   . Heart attack Brother   . Drug abuse Brother   .  Anxiety disorder Brother   . Depression Brother   . Heart disease Brother   . Cancer Brother        liver cancer age 68 y.o  . Diabetes Maternal Grandmother   . Diabetes Maternal Grandfather   . Diabetes Paternal Grandmother   . 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  albuterol (PROVENTIL HFA;VENTOLIN HFA) 108 (90 Base) MCG/ACT inhaler, Inhale 2 puffs into the lungs every 6 (six) hours as needed for wheezing or shortness of breath., Disp: 1 Inhaler, Rfl: 11 .  amLODipine (NORVASC) 2.5 MG tablet, Take 1 tablet (2.5 mg total) by mouth daily., Disp: 90 tablet, Rfl: 3 .  budesonide-formoterol (SYMBICORT) 80-4.5 MCG/ACT inhaler, Inhale 2 puffs into the  lungs 2 (two) times daily. Rinse mouth, Disp: 1 Inhaler, Rfl: 11 .  cetirizine (ZYRTEC) 10 MG tablet, Take 1 tablet (10 mg total) by mouth daily., Disp: 90 tablet, Rfl: 3 .  Cholecalciferol 1.25 MG (50000 UT) capsule, Take 1 capsule (50,000 Units total) by mouth once a week., Disp: 13 capsule, Rfl: 1 .  clonazePAM (KLONOPIN) 1 MG tablet, 0.5 in am as needed and 1 mg qhs prn, Disp: 45 tablet, Rfl: 5 .  Cyanocobalamin (B-12) 1000 MCG SUBL, Place 1 tablet under the tongue daily., Disp: 30 each, Rfl:  .  cyclobenzaprine (FLEXERIL) 5 MG tablet, Take 1-2 tablets (5-10 mg total) by mouth 2 (two) times daily as needed for muscle spasms., Disp: 40 tablet, Rfl: 2 .  Insulin Pen Needle (PEN NEEDLES) 31G X 8 MM MISC, 1 Device by Does not apply route daily., Disp: 90 each, Rfl: 3 .  lisinopril-hydrochlorothiazide (ZESTORETIC) 20-12.5 MG tablet, Take 2 tablets by mouth daily., Disp: 180 tablet, Rfl: 3 .  montelukast (SINGULAIR) 10 MG tablet, Take 1 tablet (10 mg total) by mouth at bedtime., Disp: 90 tablet, Rfl: 3 .  oxyCODONE-acetaminophen (PERCOCET) 5-325 MG tablet, Take 1 tablet by mouth every 8 (eight) hours as needed for severe pain., Disp: 15 tablet, Rfl: 0 .  pantoprazole (PROTONIX) 40 MG tablet, Take 1 tablet (40 mg total) by mouth daily. In am 30 min before food, Disp: 90 tablet, Rfl: 3 .  promethazine (PHENERGAN) 25 MG tablet, Take 0.5-1 tablets (12.5-25 mg total) by mouth every 8 (eight) hours as needed for nausea or vomiting., Disp: 40 tablet, Rfl: 0 .  SYNTHROID 200 MCG tablet, TAKE 1 TABLET BY MOUTH ONCE daily before breakfast 30 minutes  Further refills from [facility] clinic endocrine Dr. Honor Junes, Marcello Moores no exceptions. Call to make appt endocrine, Disp: 90 tablet, Rfl: 3 .  venlafaxine XR (EFFEXOR-XR) 150 MG 24 hr capsule, Take 1 capsule (150 mg total) by mouth daily with breakfast., Disp: 90 capsule, Rfl: 3 .  predniSONE (DELTASONE) 20 MG tablet, Take 1 tablet (20 mg total) by mouth daily with  breakfast. 7-10 days, Disp: 10 tablet, Rfl: 0  EXAM:  VITALS per patient if applicable:  GENERAL: alert, oriented, appears well and in no acute distress  HEENT: atraumatic, conjunttiva clear, no obvious abnormalities on inspection of external nose and ears  NECK: normal movements of the head and neck  LUNGS: on inspection no signs of respiratory distress, breathing rate appears normal, no obvious gross SOB, gasping or wheezing  CV: no obvious cyanosis  MS: moves all visible extremities without noticeable abnormality  PSYCH/NEURO: pleasant and cooperative, no obvious depression or anxiety, speech and thought processing grossly intact  ASSESSMENT  AND PLAN:  Discussed the following assessment and plan:  Rheumatoid factor positive/polyarthralgia and fatigue - Plan: predniSONE (DELTASONE) 20 MG tablet, Rheumatoid Factor, Antinuclear Antib (ANA), Uric acid, Sedimentation rate, C-reactive protein, Cyclic citrul peptide antibody, IgG (QUEST), Comprehensive metabolic panel, CBC with Differential/Platelet Refer Dr. Amil Amen Also ? Right elbow bursitis vs tendonitis defer rheum  Anxiety/depression - Plan: clonazePAM (KLONOPIN) 1 MG tablet Insomnia, unspecified type - Plan: clonazePAM (KLONOPIN) 1 MG tablet Cont meds   Fatigue, unspecified type - Plan: Comprehensive metabolic panel, CBC with Differential/Platelet, TSH, Urinalysis, Routine w reflex microscopic  Hypothyroidism, unspecified type - Plan: TSH Cont meds last tsh 03/2019 normal   Tobacco abuse rec smoking cessation   Essential hypertension ? BP no check  rec cuff and monitor goal <130/<80  Chronic fatigue W/u with labs   Barrett's esophagus without dysplasia F/u [facility]   [facility] Flu shothad10/14/19sch upcoming if desired  pna 23 had 03/2012 due at f/uin futureshe is current smoker  Tdap UTD10/25/13 rec hepBvaccinefor future(consider new x 2 doses) Consider MMR in future Check nonfasting labs    mammo10/15/19 negordered needs to be sch givne # to do this Papneg 11/20/17 no HPV EGD/colonoscopy had [facility] , need to get report colonoscopy 10/10/15[facility] Also EGD due 01/22/2019 barretts last 01/21/14 no dysplasia -referred prev. Pt has not set up encouraged to do this  Skin 07/07/19 no issues   rec smoking cesasationand exercise to lose with healthy diet choices  -we discussed possible serious and likely etiologies, options for evaluation and workup, limitations of telemedicine visit vs in person visit, treatment, treatment risks and precautions. Pt prefers to treat via telemedicine empirically rather then risking or undertaking an in person visit at this moment. Patient agrees to seek prompt in person care if worsening, new symptoms arise, or if is not improving with treatment.   I discussed the assessment and treatment plan with the patient. The patient was provided an opportunity to ask questions and all were answered. The patient agreed with the plan and demonstrated an understanding of the instructions.   The patient was advised to call back or seek an in-person evaluation if the symptoms worsen or if the condition fails to improve as anticipated.  Time spent 30 minutes  Delorise Jackson, MD

## 2019-07-07 NOTE — Patient Instructions (Addendum)
Please call [facility] to schedule your mammogram [phone]  Consider calling [facility] -ask when next colonoscopy due and follow with your upper endoscopy this is due and was due 01/2019    Arthritis Arthritis is a term that is commonly used to refer to joint pain or joint disease. There are more than 100 types of arthritis. What are the causes? The most common cause of this condition is wear and tear of a joint. Other causes include:  Gout.  Inflammation of a joint.  An infection of a joint.  Sprains and other injuries near the joint.  A reaction to medicines or drugs, or an allergic reaction. In some cases, the cause may not be known. What are the signs or symptoms? The main symptom of this condition is pain in the joint during movement. Other symptoms include:  Redness, swelling, or stiffness at a joint.  Warmth coming from the joint.  Fever.  Overall feeling of illness. How is this diagnosed? This condition may be diagnosed with a physical exam and tests, including:  Blood tests.  Urine tests.  Imaging tests, such as X-rays, an MRI, or a CT scan. Sometimes, fluid is removed from a joint for testing. How is this treated? This condition may be treated with:  Treatment of the cause, if it is known.  Rest.  Raising (elevating) the joint.  Applying cold or hot packs to the joint.  Medicines to improve symptoms and reduce inflammation.  Injections of a steroid such as cortisone into the joint to help reduce pain and inflammation. Depending on the cause of your arthritis, you may need to make lifestyle changes to reduce stress on your joint. Changes may include:  Exercising more.  Losing weight. Follow these instructions at home: Medicines  Take over-the-counter and prescription medicines only as told by your health care provider.  Do not take aspirin to relieve pain if your health care provider thinks that gout may be causing your  pain. Activity  Rest your joint if told by your health care provider. Rest is important when your disease is active and your joint feels painful, swollen, or stiff.  Avoid activities that make the pain worse. It is important to balance activity with rest.  Exercise your joint regularly with range-of-motion exercises as told by your health care provider. Try doing low-impact exercise, such as: ? Swimming. ? Water aerobics. ? Biking. ? Walking. Managing pain, stiffness, and swelling      If directed, put ice on the joint. ? Put ice in a plastic bag. ? Place a towel between your skin and the bag. ? Leave the ice on for 20 minutes, 2-3 times per day.  If your joint is swollen, raise (elevate) it above the level of your heart if directed by your health care provider.  If your joint feels stiff in the morning, try taking a warm shower.  If directed, apply heat to the affected area as often as told by your health care provider. Use the heat source that your health care provider recommends, such as a moist heat pack or a heating pad. If you have diabetes, do not apply heat without permission from your health care provider. To apply heat: ? Place a towel between your skin and the heat source. ? Leave the heat on for 20-30 minutes. ? Remove the heat if your skin turns bright red. This is especially important if you are unable to feel pain, heat, or cold. You may have a  greater risk of getting burned. General instructions  Do not use any products that contain nicotine or tobacco, such as cigarettes, e-cigarettes, and chewing tobacco. If you need help quitting, ask your health care provider.  Keep all follow-up visits as told by your health care provider. This is important. Contact a health care provider if:  The pain gets worse.  You have a fever. Get help right away if:  You develop severe joint pain, swelling, or redness.  Many joints become painful and swollen.  You develop  severe back pain.  You develop severe weakness in your leg.  You cannot control your bladder or bowels. Summary  Arthritis is a term that is commonly used to refer to joint pain or joint disease. There are more than 100 types of arthritis.  The most common cause of this condition is wear and tear of a joint. Other causes include gout, inflammation or infection of the joint, sprains, or allergies.  Symptoms of this condition include redness, swelling, or stiffness of the joint. Other symptoms include warmth, fever, or feeling ill.  This condition is treated with rest, elevation, medicines, and applying cold or hot packs.  Follow your health care provider's instructions about medicines, activity, exercises, and other home care treatments. This information is not intended to replace advice given to you by your health care provider. Make sure you discuss any questions you have with your health care provider. Document Revised: 05/11/2018 Document Reviewed: 05/11/2018 Elsevier Patient Education  [place] is when the fluid-filled sac (bursa) that covers and protects a joint is swollen (inflamed). Bursitis is most common near joints such as the knees, elbows, hips, and shoulders. It can cause pain and stiffness. Follow these instructions at home: Medicines  Take over-the-counter and prescription medicines only as told by your doctor.  If you were prescribed an antibiotic medicine, take it as told by your doctor. Do not stop taking it even if you start to feel better. General instructions   Rest the affected area as told by your doctor. ? If you can, raise (elevate) the affected area above the level of your heart while you are sitting or lying down. ? Avoid doing things that make the pain worse.  Use a splint, brace, pad, or walking aid as told by your doctor.  If directed, put ice on the affected area: ? If you have a removable splint or brace, take it  off as told by your doctor. ? Put ice in a plastic bag. ? Place a towel between your skin and the bag, or between the splint or brace and the bag. ? Leave the ice on for 20 minutes, 2-3 times a day.  Keep all follow-up visits as told by your doctor. This is important. Preventing symptoms Do these things to help you not have symptoms again:  Wear knee pads if you kneel often.  Wear sturdy running or walking shoes that fit you well.  Take a lot of breaks during activities that involve doing the same movements again and again.  Before you do any activity that takes a lot of effort, get your body ready by stretching.  Stay at a healthy weight or lose weight if your doctor says you should. If you need help doing this, ask your doctor.  Exercise often. If you start any new physical activity, do it slowly. Contact a doctor if you:  Have a fever.  Have chills.  Have symptoms that do not  get better with treatment or home care. Summary  Bursitis is when the fluid-filled sac (bursa) that covers and protects a joint is swollen.  Rest the affected area as told by your doctor.  Avoid doing things that make the pain worse.  Put ice on the affected area as told by your doctor. This information is not intended to replace advice given to you by your health care provider. Make sure you discuss any questions you have with your health care provider. Document Revised: 05/16/2017 Document Reviewed: 04/18/2017 Elsevier Patient Education  [place].   Tendinitis  Tendinitis is inflammation of a tendon. A tendon is a strong cord of tissue that connects muscle to bone. Tendinitis can affect any tendon, but it most commonly affects the:  Shoulder tendon (rotator cuff).  Ankle tendon (Achilles tendon).  Elbow tendon (triceps tendon).  Tendons in the wrist. What are the causes? This condition may be caused by:  Overusing a tendon or muscle. This is common.  Age-related wear and  tear.  Injury.  Inflammatory conditions, such as arthritis.  Certain medicines. What increases the risk? You are more likely to develop this condition if you do activities that involve the same movements over and over again (repetitive motions). What are the signs or symptoms? Symptoms of this condition may include:  Pain.  Tenderness.  Mild swelling.  Decreased range of motion. How is this diagnosed? This condition is diagnosed with a physical exam. You may also have tests, such as:  Ultrasound. This uses sound waves to make an image of the inside of your body in the affected area.  MRI. How is this treated? This condition may be treated by resting, icing, applying pressure (compression), and raising (elevating) the affected area above the level of your heart. This is known as RICE therapy. Treatment may also include:  Medicines to help reduce inflammation or to help reduce pain.  Exercises or physical therapy to strengthen and stretch the tendon.  A brace or splint.  Surgery. This is rarely needed. Follow these instructions at home: If you have a splint or brace:  Wear the splint or brace as told by your health care provider. Remove it only as told by your health care provider.  Loosen the splint or brace if your fingers or toes tingle, become numb, or turn cold and blue.  Keep the splint or brace clean.  If the splint or brace is not waterproof: ? Do not let it get wet. ? Cover it with a watertight covering when you take a bath or shower. Managing pain, stiffness, and swelling  If directed, put ice on the affected area. ? If you have a removable splint or brace, remove it as told by your health care provider. ? Put ice in a plastic bag. ? Place a towel between your skin and the bag. ? Leave the ice on for 20 minutes, 2-3 times a day.  Move the fingers or toes of the affected limb often, if this applies. This can help to prevent stiffness and lessen  swelling.  If directed, raise (elevate) the affected area above the level of your heart while you are sitting or lying down.  If directed, apply heat to the affected area before you exercise. Use the heat source that your health care provider recommends, such as a moist heat pack or a heating pad.     ? Place a towel between your skin and the heat source. ? Leave the heat on for 20-30  minutes. ? Remove the heat if your skin turns bright red. This is especially important if you are unable to feel pain, heat, or cold. You may have a greater risk of getting burned. Driving  Do not drive or use heavy machinery while taking prescription pain medicine.  Ask your health care provider when it is safe to drive if you have a splint or brace on any part of your arm or leg. Activity  Rest the affected area as told by your health care provider.  Return to your normal activities as told by your health care provider. Ask your health care provider what activities are safe for you.  Avoid using the affected area while you are experiencing symptoms of tendinitis.  Do exercises as told by your health care provider. General instructions  If you have a splint, do not put pressure on any part of the splint until it is fully hardened. This may take several hours.  Wear an elastic bandage or compression wrap only as told by your health care provider.  Take over-the-counter and prescription medicines only as told by your health care provider.  Keep all follow-up visits as told by your health care provider. This is important. Contact a health care provider if:  Your symptoms do not improve.  You develop new, unexplained problems, such as numbness in your hands. Summary  Tendinitis is inflammation of a tendon.  You are more likely to develop this condition if you do activities that involve the same movements over and over again.  This condition may be treated by resting, icing, applying pressure  (compression), and elevating the area above the level of your heart. This is known as RICE therapy.  Avoid using the affected area while you are experiencing symptoms of tendinitis. This information is not intended to replace advice given to you by your health care provider. Make sure you discuss any questions you have with your health care provider. Document Revised: 12/09/2017 Document Reviewed: 10/22/2017 Elsevier Patient Education  [place].

## 2019-07-07 NOTE — Telephone Encounter (Signed)
Rx has been called in  

## 2019-07-08 ENCOUNTER — Telehealth: Payer: Self-pay | Admitting: Internal Medicine

## 2019-07-08 NOTE — Telephone Encounter (Signed)
[facility] wanted to have pt labs results for Rheumatoid Factor that are to be drawn on 07/14/19 sent to them

## 2019-07-14 ENCOUNTER — Other Ambulatory Visit (INDEPENDENT_AMBULATORY_CARE_PROVIDER_SITE_OTHER): Payer: Managed Care, Other (non HMO)

## 2019-07-14 ENCOUNTER — Other Ambulatory Visit: Payer: Self-pay

## 2019-07-14 DIAGNOSIS — E039 Hypothyroidism, unspecified: Secondary | ICD-10-CM

## 2019-07-14 DIAGNOSIS — R768 Other specified abnormal immunological findings in serum: Secondary | ICD-10-CM | POA: Diagnosis not present

## 2019-07-14 DIAGNOSIS — R7689 Other specified abnormal immunological findings in serum: Secondary | ICD-10-CM

## 2019-07-14 DIAGNOSIS — R5383 Other fatigue: Secondary | ICD-10-CM | POA: Diagnosis not present

## 2019-07-14 DIAGNOSIS — Z23 Encounter for immunization: Secondary | ICD-10-CM | POA: Diagnosis not present

## 2019-07-14 DIAGNOSIS — M2550 Pain in unspecified joint: Secondary | ICD-10-CM | POA: Diagnosis not present

## 2019-07-15 ENCOUNTER — Telehealth: Payer: Self-pay | Admitting: Internal Medicine

## 2019-07-15 LAB — URINALYSIS, ROUTINE W REFLEX MICROSCOPIC
Bilirubin Urine: NEGATIVE
Glucose, UA: NEGATIVE
Hgb urine dipstick: NEGATIVE
Ketones, ur: NEGATIVE
Leukocytes,Ua: NEGATIVE
Nitrite: NEGATIVE
Protein, ur: NEGATIVE
Specific Gravity, Urine: 1.01 (ref 1.001–1.03)
pH: 5.5 (ref 5.0–8.0)

## 2019-07-15 LAB — CBC WITH DIFFERENTIAL/PLATELET
Basophils Absolute: 0.1 10*3/uL (ref 0.0–0.1)
Basophils Relative: 0.7 % (ref 0.0–3.0)
Eosinophils Absolute: 0 10*3/uL (ref 0.0–0.7)
Eosinophils Relative: 0.3 % (ref 0.0–5.0)
HCT: 38.5 % (ref 36.0–46.0)
Hemoglobin: 12.7 g/dL (ref 12.0–15.0)
Lymphocytes Relative: 38.1 % (ref 12.0–46.0)
Lymphs Abs: 3.2 10*3/uL (ref 0.7–4.0)
MCHC: 33 g/dL (ref 30.0–36.0)
MCV: 96.4 fl (ref 78.0–100.0)
Monocytes Absolute: 0.4 10*3/uL (ref 0.1–1.0)
Monocytes Relative: 5.2 % (ref 3.0–12.0)
Neutro Abs: 4.6 10*3/uL (ref 1.4–7.7)
Neutrophils Relative %: 55.7 % (ref 43.0–77.0)
Platelets: 296 10*3/uL (ref 150.0–400.0)
RBC: 4 Mil/uL (ref 3.87–5.11)
RDW: 14 % (ref 11.5–15.5)
WBC: 8.3 10*3/uL (ref 4.0–10.5)

## 2019-07-15 LAB — COMPREHENSIVE METABOLIC PANEL
ALT: 16 U/L (ref 0–35)
AST: 13 U/L (ref 0–37)
Albumin: 3.5 g/dL (ref 3.5–5.2)
Alkaline Phosphatase: 51 U/L (ref 39–117)
BUN: 12 mg/dL (ref 6–23)
CO2: 26 mEq/L (ref 19–32)
Calcium: 10.2 mg/dL (ref 8.4–10.5)
Chloride: 100 mEq/L (ref 96–112)
Creatinine, Ser: 0.81 mg/dL (ref 0.40–1.20)
GFR: 73.18 mL/min (ref >60.00)
Glucose, Bld: 189 mg/dL — ABNORMAL HIGH (ref 70–99)
Potassium: 3.8 mEq/L (ref 3.5–5.1)
Sodium: 138 mEq/L (ref 135–145)
Total Bilirubin: 0.3 mg/dL (ref 0.2–1.2)
Total Protein: 8 g/dL (ref 6.0–8.3)

## 2019-07-15 LAB — URIC ACID: Uric Acid, Serum: 7.7 mg/dL — ABNORMAL HIGH (ref 2.4–7.0)

## 2019-07-15 LAB — SEDIMENTATION RATE: Sed Rate: 74 mm/hr — ABNORMAL HIGH (ref 0–30)

## 2019-07-15 LAB — C-REACTIVE PROTEIN: CRP: <1 mg/dL (ref 0.5–20.0)

## 2019-07-15 LAB — TSH: TSH: 11.82 u[IU]/mL — ABNORMAL HIGH (ref 0.35–4.50)

## 2019-07-15 NOTE — Telephone Encounter (Signed)
Pt returned call for lab results.

## 2019-07-16 LAB — RHEUMATOID FACTOR: Rheumatoid fact SerPl-aCnc: <14 IU/mL (ref <14)

## 2019-07-16 LAB — CYCLIC CITRUL PEPTIDE ANTIBODY, IGG: Cyclic Citrullin Peptide Ab: <16 UNITS

## 2019-07-16 LAB — ANA: Anti Nuclear Antibody (ANA): NEGATIVE

## 2019-07-26 ENCOUNTER — Encounter: Payer: Self-pay | Admitting: Internal Medicine

## 2019-07-26 DIAGNOSIS — K635 Polyp of colon: Secondary | ICD-10-CM | POA: Insufficient documentation

## 2019-07-28 ENCOUNTER — Ambulatory Visit
Admission: RE | Admit: 2019-07-28 | Discharge: 2019-07-28 | Disposition: A | Discharge status: Home / Self Care | Payer: Managed Care, Other (non HMO) | Source: Ambulatory Visit | Attending: Internal Medicine | Admitting: Internal Medicine

## 2019-07-28 ENCOUNTER — Ambulatory Visit: Payer: Managed Care, Other (non HMO)

## 2019-07-28 DIAGNOSIS — Z1231 Encounter for screening mammogram for malignant neoplasm of breast: Secondary | ICD-10-CM

## 2019-09-30 ENCOUNTER — Other Ambulatory Visit: Payer: Self-pay | Admitting: Internal Medicine

## 2019-09-30 DIAGNOSIS — K219 Gastro-esophageal reflux disease without esophagitis: Secondary | ICD-10-CM

## 2019-09-30 MED ORDER — PANTOPRAZOLE SODIUM 40 MG PO TBEC: 40.0000 mg | DELAYED_RELEASE_TABLET | Freq: Every day | ORAL | 3 refills | Status: DC

## 2019-10-07 ENCOUNTER — Ambulatory Visit: Payer: Managed Care, Other (non HMO) | Admitting: Internal Medicine

## 2019-10-07 ENCOUNTER — Encounter: Payer: Self-pay | Admitting: Internal Medicine

## 2019-10-07 ENCOUNTER — Other Ambulatory Visit: Payer: Self-pay

## 2019-10-07 VITALS — BP 124/82 | HR 92 | Temp 97.7°F | Ht 62.0 in | Wt 233.4 lb

## 2019-10-07 DIAGNOSIS — E039 Hypothyroidism, unspecified: Secondary | ICD-10-CM

## 2019-10-07 DIAGNOSIS — M1990 Unspecified osteoarthritis, unspecified site: Secondary | ICD-10-CM

## 2019-10-07 DIAGNOSIS — E538 Deficiency of other specified B group vitamins: Secondary | ICD-10-CM

## 2019-10-07 DIAGNOSIS — G4700 Insomnia, unspecified: Secondary | ICD-10-CM

## 2019-10-07 DIAGNOSIS — J40 Bronchitis, not specified as acute or chronic: Secondary | ICD-10-CM | POA: Diagnosis not present

## 2019-10-07 DIAGNOSIS — F419 Anxiety disorder, unspecified: Secondary | ICD-10-CM

## 2019-10-07 DIAGNOSIS — M1380 Other specified arthritis, unspecified site: Secondary | ICD-10-CM | POA: Insufficient documentation

## 2019-10-07 DIAGNOSIS — Z0000 Encounter for general adult medical examination without abnormal findings: Secondary | ICD-10-CM

## 2019-10-07 DIAGNOSIS — J309 Allergic rhinitis, unspecified: Secondary | ICD-10-CM

## 2019-10-07 DIAGNOSIS — I10 Essential (primary) hypertension: Secondary | ICD-10-CM

## 2019-10-07 DIAGNOSIS — F329 Major depressive disorder, single episode, unspecified: Secondary | ICD-10-CM

## 2019-10-07 DIAGNOSIS — E559 Vitamin D deficiency, unspecified: Secondary | ICD-10-CM

## 2019-10-07 MED ORDER — MONTELUKAST SODIUM 10 MG PO TABS: 10.0000 mg | ORAL_TABLET | Freq: Every day | ORAL | 3 refills | Status: DC

## 2019-10-07 MED ORDER — LISINOPRIL-HYDROCHLOROTHIAZIDE 20-12.5 MG PO TABS: 2.0000 | ORAL_TABLET | Freq: Every day | ORAL | 3 refills | Status: DC

## 2019-10-07 MED ORDER — ALBUTEROL SULFATE HFA 108 (90 BASE) MCG/ACT IN AERS: 2.0000 | INHALATION_SPRAY | Freq: Four times a day (QID) | RESPIRATORY_TRACT | 11 refills | Status: DC | PRN

## 2019-10-07 MED ORDER — BUDESONIDE-FORMOTEROL FUMARATE 80-4.5 MCG/ACT IN AERO: 2.0000 | INHALATION_SPRAY | Freq: Two times a day (BID) | RESPIRATORY_TRACT | 11 refills | Status: DC

## 2019-10-07 MED ORDER — B-12 1000 MCG SL SUBL: 1.0000 | SUBLINGUAL_TABLET | Freq: Every day | SUBLINGUAL | 3 refills | Status: DC

## 2019-10-07 MED ORDER — CETIRIZINE HCL 10 MG PO TABS: 10.0000 mg | ORAL_TABLET | Freq: Every day | ORAL | 3 refills | Status: DC

## 2019-10-07 MED ORDER — CLONAZEPAM 1 MG PO TABS: 1.0000 mg | ORAL_TABLET | Freq: Two times a day (BID) | ORAL | 5 refills | Status: DC | PRN

## 2019-10-07 MED ORDER — CHOLECALCIFEROL 1.25 MG (50000 UT) PO CAPS: 50000.0000 [IU] | ORAL_CAPSULE | ORAL | 1 refills | Status: DC

## 2019-10-07 MED ORDER — BUPROPION HCL ER (XL) 150 MG PO TB24: 150.0000 mg | ORAL_TABLET | Freq: Every day | ORAL | 3 refills | Status: DC

## 2019-10-07 NOTE — Progress Notes (Signed)
Chief Complaint  Patient presents with  . Annual Exam   Annual filled out health physical form for work  1. HTN doing better on lis hctz 2 pills qd stopped norvasc 2.5 mg qd  2. Hypothyroidism tsh uncontrolled tsh check 4 weeks with endocrine and on levo 200 mcg qd and f/u upcoming Endocrine 11/2019  3. ? Inflammatory arthritis Xrays with rheum all negative but ESR elevated and on prednisone x 30 days per Dr. Amil Amen  4. Anxiety/depression GAD 7 12 andPHQ 9 score 19 on effexor 150 mg qd and klonopin 0.5 in am and 1 mg qhs declines psych for now and does not want to see Dr. Nicolasa Ducking in the future and has seen 3 psych in the past and seen therapy in the past w/o help  She has tried zoloft, paxil, cymbalta in the past    Review of Systems  Constitutional: Negative for weight loss.  HENT: Negative for hearing loss.   Eyes: Negative for blurred vision.  Respiratory: Negative for shortness of breath.   Cardiovascular: Negative for chest pain.  Gastrointestinal: Negative for abdominal pain.  Musculoskeletal: Negative for falls.  Skin: Negative for rash.  Neurological: Negative for headaches.  Psychiatric/Behavioral: Positive for depression. The patient is nervous/anxious and has insomnia.    Past Medical History:  Diagnosis Date  . Anxiety   . Arthritis   . Asthma   . Depression   . GERD (gastroesophageal reflux disease)   . Hypertension   . Hypothyroidism   . Pneumonia   . Thyroid disease    Past Surgical History:  Procedure Laterality Date  . ABLATION  2006  . BREAST CYST ASPIRATION Left 1998  . BREAST SURGERY     cyst aspiration  . BREAST SURGERY  1998   cyst aspiration  . COLONOSCOPY  03/2014  . ENDOMETRIAL ABLATION    . ESOPHAGOGASTRODUODENOSCOPY (EGD) WITH PROPOFOL N/A 08/15/2015   Procedure: ESOPHAGOGASTRODUODENOSCOPY (EGD) WITH PROPOFOL;  Surgeon: Lollie Sails, MD;  Location: [facility] ENDOSCOPY;  Service: Endoscopy;  Laterality: N/A;  . ESOPHAGOGASTRODUODENOSCOPY (EGD)  WITH PROPOFOL N/A 01/22/2016   Procedure: ESOPHAGOGASTRODUODENOSCOPY (EGD) WITH PROPOFOL;  Surgeon: Lollie Sails, MD;  Location: [facility] ENDOSCOPY;  Service: Endoscopy;  Laterality: N/A;  . GASTRIC BYPASS  2005  . HERNIA REPAIR    . NASAL SINUS SURGERY    . TOTAL THYROIDECTOMY    . TUBAL LIGATION     Family History  Problem Relation Age of Onset  . Heart disease Mother   . COPD Mother   . Arthritis Mother   . Cancer Mother        uterine  . Lupus Mother   . Anxiety disorder Mother   . Depression Mother   . Drug abuse Mother   . COPD Father   . Arthritis Father   . Heart disease Father   . Heart attack Father   . Alcohol abuse Father   . Heart disease Brother   . Heart attack Brother   . Drug abuse Brother   . Anxiety disorder Brother   . Depression Brother   . Heart disease Brother   . Cancer Brother        liver cancer age 86 y.o  . Diabetes Maternal Grandmother   . Diabetes Maternal Grandfather   . Diabetes Paternal Grandmother   . Diabetes Paternal Grandfather    Social History   Socioeconomic History  . Marital status: Divorced    Spouse name: Not on file  . Number of  children: 2  . Years of education: Not on file  . Highest education level: High school graduate  Occupational History  . Not on file  Tobacco Use  . Smoking status: Current Every Day Smoker    Packs/day: 1.00    Years: 30.00    Pack years: 30.00    Types: Cigarettes    Start date: 03/15/1980  . Smokeless tobacco: Never Used  Substance and Sexual Activity  . Alcohol use: Yes    Alcohol/week: 10.0 - 12.0 standard drinks    Types: 8 - 10 Glasses of wine, 2 Standard drinks or equivalent per week  . Drug use: No  . Sexual activity: Yes    Partners: Male  Other Topics Concern  . Not on file  Social History Narrative   Lives in [place] single, divorced       Work - 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       Diet - healthy diet   Exercise - limited   Caffeine use: daily   Social  Determinants of Health   Financial Resource Strain:   . Difficulty of Paying Living Expenses:   Food Insecurity:   . Worried About Charity fundraiser in the Last Year:   . Arboriculturist in the Last Year:   Transportation Needs:   . Film/video editor (Medical):   Marland Kitchen Lack of Transportation (Non-Medical):   Physical Activity:   . Days of Exercise per Week:   . Minutes of Exercise per Session:   Stress:   . Feeling of Stress :   Social Connections:   . Frequency of Communication with Friends and Family:   . Frequency of Social Gatherings with Friends and Family:   . Attends Religious Services:   . Active Member of Clubs or Organizations:   . Attends Archivist Meetings:   Marland Kitchen Marital Status:   Intimate Partner Violence:   . Fear of Current or Ex-Partner:   . Emotionally Abused:   Marland Kitchen Physically Abused:   . Sexually Abused:    Current Meds  Medication Sig  . clonazePAM (KLONOPIN) 1 MG tablet Take 1 tablet (1 mg total) by mouth 2 (two) times daily as needed for anxiety. 0.5 in am as needed and 1 mg qhs prn  . Insulin Pen Needle (PEN NEEDLES) 31G X 8 MM MISC 1 Device by Does not apply route daily.  . pantoprazole (PROTONIX) 40 MG tablet Take 1 tablet (40 mg total) by mouth daily. In am 30 min before food  . predniSONE (DELTASONE) 20 MG tablet Take 1 tablet (20 mg total) by mouth daily with breakfast. 7-10 days  . SYNTHROID 200 MCG tablet TAKE 1 TABLET BY MOUTH ONCE daily before breakfast 30 minutes  Further refills from [facility] clinic endocrine Dr. Honor Junes, Marcello Moores no exceptions. Call to make appt endocrine  . venlafaxine XR (EFFEXOR-XR) 150 MG 24 hr capsule Take 1 capsule (150 mg total) by mouth daily with breakfast.  . [DISCONTINUED] albuterol (PROVENTIL HFA;VENTOLIN HFA) 108 (90 Base) MCG/ACT inhaler Inhale 2 puffs into the lungs every 6 (six) hours as needed for wheezing or shortness of breath.  . [DISCONTINUED] budesonide-formoterol (SYMBICORT) 80-4.5 MCG/ACT inhaler  Inhale 2 puffs into the lungs 2 (two) times daily. Rinse mouth  . [DISCONTINUED] cetirizine (ZYRTEC) 10 MG tablet Take 1 tablet (10 mg total) by mouth daily.  . [DISCONTINUED] Cholecalciferol 1.25 MG (50000 UT) capsule Take 1 capsule (50,000 Units total) by mouth once a week.  . [  DISCONTINUED] clonazePAM (KLONOPIN) 1 MG tablet 0.5 in am as needed and 1 mg qhs prn  . [DISCONTINUED] Cyanocobalamin (B-12) 1000 MCG SUBL Place 1 tablet under the tongue daily.  . [DISCONTINUED] lisinopril-hydrochlorothiazide (ZESTORETIC) 20-12.5 MG tablet Take 2 tablets by mouth daily.  . [DISCONTINUED] montelukast (SINGULAIR) 10 MG tablet Take 1 tablet (10 mg total) by mouth at bedtime.  Marland Kitchen albuterol (VENTOLIN HFA) 108 (90 Base) MCG/ACT inhaler Inhale 2 puffs into the lungs every 6 (six) hours as needed for wheezing or shortness of breath.  . budesonide-formoterol (SYMBICORT) 80-4.5 MCG/ACT inhaler Inhale 2 puffs into the lungs 2 (two) times daily. Rinse mouth  . cetirizine (ZYRTEC) 10 MG tablet Take 1 tablet (10 mg total) by mouth daily.  . Cholecalciferol 1.25 MG (50000 UT) capsule Take 1 capsule (50,000 Units total) by mouth once a week.  . Cyanocobalamin (B-12) 1000 MCG SUBL Place 1 tablet under the tongue daily.  Marland Kitchen lisinopril-hydrochlorothiazide (ZESTORETIC) 20-12.5 MG tablet Take 2 tablets by mouth daily.  . montelukast (SINGULAIR) 10 MG tablet Take 1 tablet (10 mg total) by mouth at bedtime.   No Known Allergies Recent Results (from the past 2160 hour(s))  Urinalysis, Routine w reflex microscopic     Status: None   Collection Time: 07/14/19  2:59 PM  Result Value Ref Range   Color, Urine YELLOW YELLOW   APPearance CLEAR CLEAR   Specific Gravity, Urine 1.010 1.001 - 1.03   pH 5.5 5.0 - 8.0   Glucose, UA NEGATIVE NEGATIVE   Bilirubin Urine NEGATIVE NEGATIVE   Ketones, ur NEGATIVE NEGATIVE   Hgb urine dipstick NEGATIVE NEGATIVE   Protein, ur NEGATIVE NEGATIVE   Nitrite NEGATIVE NEGATIVE   Leukocytes,Ua  NEGATIVE NEGATIVE  TSH     Status: Abnormal   Collection Time: 07/14/19  2:59 PM  Result Value Ref Range   TSH 11.82 (H) 0.35 - 4.50 uIU/mL  CBC with Differential/Platelet     Status: None   Collection Time: 07/14/19  2:59 PM  Result Value Ref Range   WBC 8.3 4.0 - 10.5 K/uL   RBC 4.00 3.87 - 5.11 Mil/uL   Hemoglobin 12.7 12.0 - 15.0 g/dL   HCT 38.5 36.0 - 46.0 %   MCV 96.4 78.0 - 100.0 fl   MCHC 33.0 30.0 - 36.0 g/dL   RDW 14.0 11.5 - 15.5 %   Platelets 296.0 150.0 - 400.0 K/uL   Neutrophils Relative % 55.7 43.0 - 77.0 %   Lymphocytes Relative 38.1 12.0 - 46.0 %   Monocytes Relative 5.2 3.0 - 12.0 %   Eosinophils Relative 0.3 0.0 - 5.0 %   Basophils Relative 0.7 0.0 - 3.0 %   Neutro Abs 4.6 1.4 - 7.7 K/uL   Lymphs Abs 3.2 0.7 - 4.0 K/uL   Monocytes Absolute 0.4 0.1 - 1.0 K/uL   Eosinophils Absolute 0.0 0.0 - 0.7 K/uL   Basophils Absolute 0.1 0.0 - 0.1 K/uL  Comprehensive metabolic panel     Status: Abnormal   Collection Time: 07/14/19  2:59 PM  Result Value Ref Range   Sodium 138 135 - 145 mEq/L   Potassium 3.8 3.5 - 5.1 mEq/L   Chloride 100 96 - 112 mEq/L   CO2 26 19 - 32 mEq/L   Glucose, Bld 189 (H) 70 - 99 mg/dL   BUN 12 6 - 23 mg/dL   Creatinine, Ser 0.81 0.40 - 1.20 mg/dL   Total Bilirubin 0.3 0.2 - 1.2 mg/dL   Alkaline Phosphatase 51 39 -  117 U/L   AST 13 0 - 37 U/L   ALT 16 0 - 35 U/L   Total Protein 8.0 6.0 - 8.3 g/dL   Albumin 3.5 3.5 - 5.2 g/dL   GFR 73.18 >60.00 mL/min   Calcium 10.2 8.4 - 45.6 mg/dL  Cyclic citrul peptide antibody, IgG ([facility])     Status: None   Collection Time: 07/14/19  2:59 PM  Result Value Ref Range   Cyclic Citrullin Peptide Ab <16 UNITS    Comment: Reference Range Negative:            <20 Weak Positive:       20-39 Moderate Positive:   40-59 Strong Positive:     >59 .   C-reactive protein     Status: None   Collection Time: 07/14/19  2:59 PM  Result Value Ref Range   CRP <1.0 0.5 - 20.0 mg/dL  Sedimentation rate      Status: Abnormal   Collection Time: 07/14/19  2:59 PM  Result Value Ref Range   Sed Rate 74 (H) 0 - 30 mm/hr  Uric acid     Status: Abnormal   Collection Time: 07/14/19  2:59 PM  Result Value Ref Range   Uric Acid, Serum 7.7 (H) 2.4 - 7.0 mg/dL  Antinuclear Antib (ANA)     Status: None   Collection Time: 07/14/19  2:59 PM  Result Value Ref Range   Anti Nuclear Antibody (ANA) NEGATIVE NEGATIVE    Comment: ANA IFA is a first line screen for detecting the presence of up to approximately 150 autoantibodies in various autoimmune diseases. A negative ANA IFA result suggests an ANA-associated autoimmune disease is not present at this time, but is not definitive. If there is high clinical suspicion for Sjogren's syndrome, testing for anti-SS-A/Ro antibody should be considered. Anti-Jo-1 antibody should be considered for clinically suspected inflammatory myopathies. . AC-0: Negative . International Consensus on ANA Patterns (https://www.hernandez-brewer.com/) . For additional information, please refer to http://education.[facility].com/faq/FAQ177 (This link is being provided for informational/ educational purposes only.) .   Rheumatoid Factor     Status: None   Collection Time: 07/14/19  2:59 PM  Result Value Ref Range   Rhuematoid fact SerPl-aCnc <14 <14 IU/mL   Objective  Body mass index is 42.69 kg/m. Wt Readings from Last 3 Encounters:  10/07/19 233 lb 6.4 oz (105.9 kg)  07/07/19 218 lb 3.2 oz (99 kg)  03/10/19 218 lb 3.2 oz (99 kg)   Temp Readings from Last 3 Encounters:  10/07/19 97.7 F (36.5 C) (Oral)  09/10/18 (!) 97.5 F (36.4 C) (Oral)  06/01/18 (!) 97.3 F (36.3 C) (Oral)   BP Readings from Last 3 Encounters:  10/07/19 124/82  09/10/18 (!) 154/86  06/01/18 (!) 142/82   Pulse Readings from Last 3 Encounters:  10/07/19 92  09/10/18 75  06/01/18 88    Physical Exam Vitals and nursing note reviewed.  Constitutional:      Appearance: Normal  appearance. She is well-developed and well-groomed. She is morbidly obese.  HENT:     Head: Normocephalic and atraumatic.     Mouth/Throat:     Mouth: Mucous membranes are moist.     Pharynx: Oropharynx is clear.  Eyes:     Conjunctiva/sclera: Conjunctivae normal.     Pupils: Pupils are equal, round, and reactive to light.  Cardiovascular:     Rate and Rhythm: Normal rate and regular rhythm.     Heart sounds: Normal heart sounds. No murmur.  Pulmonary:     Effort: Pulmonary effort is normal.     Breath sounds: Normal breath sounds.  Skin:    General: Skin is warm and dry.  Neurological:     General: No focal deficit present.     Mental Status: She is alert and oriented to person, place, and time. Mental status is at baseline.     Gait: Gait normal.  Psychiatric:        Attention and Perception: Attention and perception normal.        Mood and Affect: Mood and affect normal.        Speech: Speech normal.        Behavior: Behavior normal. Behavior is cooperative.        Thought Content: Thought content normal.        Cognition and Memory: Cognition and memory normal.        Judgment: Judgment normal.     Assessment  Plan  Annual physical exam Flu shothad1/27/21 pna 23 had 03/2012 due at f/uin futureshe is current smoker  covid shot had 2/2 need to doc dates   Tdap UTD10/25/13 rec hepBvaccinefor future(consider new x 2 doses) Consider MMR in future  mammo2/10/21 neg Papneg 11/20/17 no HPV EGD/colonoscopy had [facility] , need to get report colonoscopy 10/10/15[facility] Also EGD due 01/22/2019 barretts last 01/21/14 no dysplasia -referred prev. Pt has not set up encouraged to do this  Saw telephone visit [facility] 09/29/19 resch procedures Skin 10/07/19 no issues   rec smoking cesasationand exercise to lose with healthy diet choiceswaist 48.5 in today F/u [facility] Endocrine Dr. Asa Lente connell   Anxiety - Plan: clonazePAM (KLONOPIN) 1 MG tablet Insomnia, unspecified type -  Plan: clonazePAM (KLONOPIN) 1 MG tablet bid prn Anxiety and depression - Plan: buPROPion (WELLBUTRIN XL) 150 MG 24 hr tablet to effexor 150 mg qd -consider psych Dr. Harlene Ramus in [place] vs [place]  Bronchitis - Plan: albuterol (VENTOLIN HFA) 108 (90 Base) MCG/ACT inhaler, budesonide-formoterol (SYMBICORT) 80-4.5 MCG/ACT inhaler  Allergic rhinitis, unspecified seasonality, unspecified trigger - Plan: cetirizine (ZYRTEC) 10 MG tablet, montelukast (SINGULAIR) 10 MG tablet  Vitamin D deficiency - Plan: Cholecalciferol 1.25 MG (50000 UT) capsule  Vitamin B12 deficiency - Plan: Cyanocobalamin (B-12) 1000 MCG SUBL  Essential hypertension - Plan: lisinopril-hydrochlorothiazide (ZESTORETIC) 20-12.5 MG tablet Pt stopped norvasc 2.5 on her own   Hypothyroidism, unspecified type Cont meds  F/u [facility] endocrine  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 Olivia Mackie McLean-[facility]oc[facility]

## 2019-10-07 NOTE — Patient Instructions (Addendum)
Results for Paula Garrett, Paula Garrett (MRN [id number]) as of 10/07/2019 11:31  Ref. Range 06/01/2018 09:29 09/10/2018 10:10 03/25/2019 10:54 07/14/2019 14:59  TSH Latest Ref Range: 0.35 - 4.50 uIU/mL 0.12 (L) 1.55 2.63 11.82 (H)  T4,Free(Direct) Latest Ref Range: 0.60 - 1.60 ng/dL 0.81      Replika, bloom, headspace, calm, insight timer meditation apps   Results for Paula Garrett, Paula Garrett (MRN [id number]) as of 10/07/2019 11:32  Ref. Range 03/25/2019 10:54  Total CHOL/HDL Ratio Unknown 2  Cholesterol Latest Ref Range: 0 - 200 mg/dL 218 (H)  HDL Cholesterol Latest Ref Range: >39.00 mg/dL 96.80  LDL (calc) Latest Ref Range: 0 - 99 mg/dL 94  NonHDL Unknown 120.95  Triglycerides Latest Ref Range: 0.0 - 149.0 mg/dL 136.0  VLDL Latest Ref Range: 0.0 - 40.0 mg/dL 27.2     Cholesterol Content in Foods Cholesterol is a waxy, fat-like substance that helps to carry fat in the blood. The body needs cholesterol in small amounts, but too much cholesterol can cause damage to the arteries and heart. Most people should eat less than 200 milligrams (mg) of cholesterol a day. Foods with cholesterol  Cholesterol is found in animal-based foods, such as meat, seafood, and dairy. Generally, low-fat dairy and lean meats have less cholesterol than full-fat dairy and fatty meats. The milligrams of cholesterol per serving (mg per serving) of common cholesterol-containing foods are listed below. Meat and other proteins  Egg -- one large whole egg has 186 mg.  Veal shank -- 4 oz has 141 mg.  Lean ground [place] (93% lean) -- 4 oz has 118 mg.  Fat-trimmed lamb loin -- 4 oz has 106 mg.  Lean ground beef (90% lean) -- 4 oz has 100 mg.  Lobster -- 3.5 oz has 90 mg.  Pork loin chops -- 4 oz has 86 mg.  Canned salmon -- 3.5 oz has 83 mg.  Fat-trimmed beef top loin -- 4 oz has 78 mg.  Frankfurter -- 1 frank (3.5 oz) has 77 mg.  Crab -- 3.5 oz has 71 mg.  Roasted chicken without skin, white meat -- 4 oz has 66 mg.  Light  bologna -- 2 oz has 45 mg.  Deli-cut [place] -- 2 oz has 31 mg.  Canned tuna -- 3.5 oz has 31 mg.  Berniece Salines -- 1 oz has 29 mg.  Oysters and mussels (raw) -- 3.5 oz has 25 mg.  Mackerel -- 1 oz has 22 mg.  Trout -- 1 oz has 20 mg.  Pork sausage -- 1 link (1 oz) has 17 mg.  Salmon -- 1 oz has 16 mg.  Tilapia -- 1 oz has 14 mg. Dairy  Soft-serve ice cream --  cup (4 oz) has 103 mg.  Whole-milk yogurt -- 1 cup (8 oz) has 29 mg.  Cheddar cheese -- 1 oz has 28 mg.  American cheese -- 1 oz has 28 mg.  Whole milk -- 1 cup (8 oz) has 23 mg.  2% milk -- 1 cup (8 oz) has 18 mg.  Cream cheese -- 1 tablespoon (Tbsp) has 15 mg.  Cottage cheese --  cup (4 oz) has 14 mg.  Low-fat (1%) milk -- 1 cup (8 oz) has 10 mg.  Sour cream -- 1 Tbsp has 8.5 mg.  Low-fat yogurt -- 1 cup (8 oz) has 8 mg.  Nonfat Greek yogurt -- 1 cup (8 oz) has 7 mg.  Half-and-half cream -- 1 Tbsp has 5 mg. Fats and oils  Cod liver  oil -- 1 tablespoon (Tbsp) has 82 mg.  Butter -- 1 Tbsp has 15 mg.  Lard -- 1 Tbsp has 14 mg.  Bacon grease -- 1 Tbsp has 14 mg.  Mayonnaise -- 1 Tbsp has 5-10 mg.  Margarine -- 1 Tbsp has 3-10 mg. Exact amounts of cholesterol in these foods may vary depending on specific ingredients and brands. Foods without cholesterol Most plant-based foods do not have cholesterol unless you combine them with a food that has cholesterol. Foods without cholesterol include:  Grains and cereals.  Vegetables.  Fruits.  Vegetable oils, such as olive, canola, and sunflower oil.  Legumes, such as peas, beans, and lentils.  Nuts and seeds.  Egg whites. Summary  The body needs cholesterol in small amounts, but too much cholesterol can cause damage to the arteries and heart.  Most people should eat less than 200 milligrams (mg) of cholesterol a day. This information is not intended to replace advice given to you by your health care provider. Make sure you discuss any questions you  have with your health care provider. Document Revised: 05/16/2017 Document Reviewed: 01/28/2017 Elsevier Patient Education  [place].  High Cholesterol  High cholesterol is a condition in which the blood has high levels of a white, waxy, fat-like substance (cholesterol). The human body needs small amounts of cholesterol. The liver makes all the cholesterol that the body needs. Extra (excess) cholesterol comes from the food that we eat. Cholesterol is carried from the liver by the blood through the blood vessels. If you have high cholesterol, deposits (plaques) may build up on the walls of your blood vessels (arteries). Plaques make the arteries narrower and stiffer. Cholesterol plaques increase your risk for heart attack and stroke. Work with your health care provider to keep your cholesterol levels in a healthy range. What increases the risk? This condition is more likely to develop in people who:  Eat foods that are high in animal fat (saturated fat) or cholesterol.  Are overweight.  Are not getting enough exercise.  Have a family history of high cholesterol. What are the signs or symptoms? There are no symptoms of this condition. How is this diagnosed? This condition may be diagnosed from the results of a blood test.  If you are older than age 66, your health care provider may check your cholesterol every 4-6 years.  You may be checked more often if you already have high cholesterol or other risk factors for heart disease. The blood test for cholesterol measures:  "Bad" cholesterol (LDL cholesterol). This is the main type of cholesterol that causes heart disease. The desired level for LDL is less than 100.  "Good" cholesterol (HDL cholesterol). This type helps to protect against heart disease by cleaning the arteries and carrying the LDL away. The desired level for HDL is 60 or higher.  Triglycerides. These are fats that the body can store or burn for energy. The desired  number for triglycerides is lower than 150.  Total cholesterol. This is a measure of the total amount of cholesterol in your blood, including LDL cholesterol, HDL cholesterol, and triglycerides. A healthy number is less than 200. How is this treated? This condition is treated with diet changes, lifestyle changes, and medicines. Diet changes  This may include eating more whole grains, fruits, vegetables, nuts, and fish.  This may also include cutting back on red meat and foods that have a lot of added sugar. Lifestyle changes  Changes may include getting at least 40  minutes of aerobic exercise 3 times a week. Aerobic exercises include walking, biking, and swimming. Aerobic exercise along with a healthy diet can help you maintain a healthy weight.  Changes may also include quitting smoking. Medicines  Medicines are usually given if diet and lifestyle changes have failed to reduce your cholesterol to healthy levels.  Your health care provider may prescribe a statin medicine. Statin medicines have been shown to reduce cholesterol, which can reduce the risk of heart disease. Follow these instructions at home: Eating and drinking If told by your health care provider:  Eat chicken (without skin), fish, veal, shellfish, ground [place] breast, and round or loin cuts of red meat.  Do not eat fried foods or fatty meats, such as hot dogs and salami.  Eat plenty of fruits, such as apples.  Eat plenty of vegetables, such as broccoli, potatoes, and carrots.  Eat beans, peas, and lentils.  Eat grains such as barley, rice, couscous, and bulgur wheat.  Eat pasta without cream sauces.  Use skim or nonfat milk, and eat low-fat or nonfat yogurt and cheeses.  Do not eat or drink whole milk, cream, ice cream, egg yolks, or hard cheeses.  Do not eat stick margarine or tub margarines that contain trans fats (also called partially hydrogenated oils).  Do not eat saturated tropical oils, such as  coconut oil and palm oil.  Do not eat cakes, cookies, crackers, or other baked goods that contain trans fats.  General instructions  Exercise as directed by your health care provider. Increase your activity level with activities such as gardening, walking, and taking the stairs.  Take over-the-counter and prescription medicines only as told by your health care provider.  Do not use any products that contain nicotine or tobacco, such as cigarettes and e-cigarettes. If you need help quitting, ask your health care provider.  Keep all follow-up visits as told by your health care provider. This is important. Contact a health care provider if:  You are struggling to maintain a healthy diet or weight.  You need help to start on an exercise program.  You need help to stop smoking. Get help right away if:  You have chest pain.  You have trouble breathing. This information is not intended to replace advice given to you by your health care provider. Make sure you discuss any questions you have with your health care provider. Document Revised: 06/06/2017 Document Reviewed: 12/02/2015 Elsevier Patient Education  [place].

## 2019-10-11 ENCOUNTER — Encounter: Payer: Self-pay | Admitting: Internal Medicine

## 2019-10-12 ENCOUNTER — Encounter: Payer: Self-pay | Admitting: Internal Medicine

## 2019-10-12 ENCOUNTER — Other Ambulatory Visit: Payer: Self-pay | Admitting: Internal Medicine

## 2019-10-12 DIAGNOSIS — G4700 Insomnia, unspecified: Secondary | ICD-10-CM

## 2019-10-12 DIAGNOSIS — F419 Anxiety disorder, unspecified: Secondary | ICD-10-CM

## 2019-10-12 MED ORDER — CLONAZEPAM 1 MG PO TABS: 1.0000 mg | ORAL_TABLET | Freq: Two times a day (BID) | ORAL | 5 refills | Status: DC | PRN

## 2019-10-15 ENCOUNTER — Telehealth: Payer: Self-pay | Admitting: Internal Medicine

## 2019-10-15 NOTE — Telephone Encounter (Signed)
-----   Message from Delorise Jackson, MD sent at 10/07/2019 12:27 PM EDT ----- Documment covid vaccine dates please check [email or web address]

## 2019-10-15 NOTE — Telephone Encounter (Signed)
Shots not documented I [facility]. Will send mychart to patient to respond with a picture of her vaccination card.

## 2019-10-20 NOTE — Telephone Encounter (Signed)
Patient has seen the message on mychart but there has been no reply.

## 2019-10-27 ENCOUNTER — Other Ambulatory Visit: Payer: Self-pay | Admitting: Internal Medicine

## 2019-10-27 DIAGNOSIS — M1990 Unspecified osteoarthritis, unspecified site: Secondary | ICD-10-CM

## 2019-10-27 MED ORDER — HYDROXYCHLOROQUINE SULFATE 200 MG PO TABS: 200.0000 mg | ORAL_TABLET | Freq: Two times a day (BID) | ORAL | Status: DC

## 2020-01-04 ENCOUNTER — Other Ambulatory Visit
Admission: RE | Admit: 2020-01-04 | Discharge: 2020-01-04 | Disposition: A | Discharge status: Home / Self Care | Payer: Managed Care, Other (non HMO) | Source: Ambulatory Visit | Attending: Internal Medicine | Admitting: Internal Medicine

## 2020-01-04 ENCOUNTER — Other Ambulatory Visit: Payer: Self-pay

## 2020-01-04 DIAGNOSIS — Z01812 Encounter for preprocedural laboratory examination: Secondary | ICD-10-CM | POA: Insufficient documentation

## 2020-01-04 DIAGNOSIS — Z20822 Contact with and (suspected) exposure to covid-19: Secondary | ICD-10-CM | POA: Insufficient documentation

## 2020-01-04 LAB — SARS CORONAVIRUS 2 (TAT 6-24 HRS): SARS Coronavirus 2: NEGATIVE

## 2020-01-05 ENCOUNTER — Encounter: Payer: Self-pay | Admitting: Internal Medicine

## 2020-01-06 ENCOUNTER — Encounter
Admission: RE | Disposition: A | Discharge status: Home / Self Care | Payer: Self-pay | Source: Home / Self Care | Attending: Internal Medicine

## 2020-01-06 ENCOUNTER — Other Ambulatory Visit: Payer: Self-pay

## 2020-01-06 ENCOUNTER — Ambulatory Visit: Payer: Managed Care, Other (non HMO) | Admitting: Certified Registered Nurse Anesthetist

## 2020-01-06 ENCOUNTER — Encounter: Payer: Self-pay | Admitting: Internal Medicine

## 2020-01-06 ENCOUNTER — Ambulatory Visit
Admission: RE | Admit: 2020-01-06 | Discharge: 2020-01-06 | Disposition: A | Discharge status: Home / Self Care | Payer: Managed Care, Other (non HMO) | Attending: Internal Medicine | Admitting: Internal Medicine

## 2020-01-06 DIAGNOSIS — E039 Hypothyroidism, unspecified: Secondary | ICD-10-CM | POA: Insufficient documentation

## 2020-01-06 DIAGNOSIS — F329 Major depressive disorder, single episode, unspecified: Secondary | ICD-10-CM | POA: Diagnosis not present

## 2020-01-06 DIAGNOSIS — Z7951 Long term (current) use of inhaled steroids: Secondary | ICD-10-CM | POA: Insufficient documentation

## 2020-01-06 DIAGNOSIS — Z79890 Hormone replacement therapy (postmenopausal): Secondary | ICD-10-CM | POA: Insufficient documentation

## 2020-01-06 DIAGNOSIS — K219 Gastro-esophageal reflux disease without esophagitis: Secondary | ICD-10-CM | POA: Insufficient documentation

## 2020-01-06 DIAGNOSIS — K2270 Barrett's esophagus without dysplasia: Secondary | ICD-10-CM | POA: Insufficient documentation

## 2020-01-06 DIAGNOSIS — Z1211 Encounter for screening for malignant neoplasm of colon: Secondary | ICD-10-CM | POA: Insufficient documentation

## 2020-01-06 DIAGNOSIS — K5730 Diverticulosis of large intestine without perforation or abscess without bleeding: Secondary | ICD-10-CM | POA: Diagnosis not present

## 2020-01-06 DIAGNOSIS — G4730 Sleep apnea, unspecified: Secondary | ICD-10-CM | POA: Diagnosis not present

## 2020-01-06 DIAGNOSIS — J449 Chronic obstructive pulmonary disease, unspecified: Secondary | ICD-10-CM | POA: Diagnosis not present

## 2020-01-06 DIAGNOSIS — Z86010 Personal history of colonic polyps: Secondary | ICD-10-CM | POA: Diagnosis not present

## 2020-01-06 DIAGNOSIS — Z7952 Long term (current) use of systemic steroids: Secondary | ICD-10-CM | POA: Diagnosis not present

## 2020-01-06 DIAGNOSIS — Z79899 Other long term (current) drug therapy: Secondary | ICD-10-CM | POA: Diagnosis not present

## 2020-01-06 DIAGNOSIS — F419 Anxiety disorder, unspecified: Secondary | ICD-10-CM | POA: Diagnosis not present

## 2020-01-06 DIAGNOSIS — I10 Essential (primary) hypertension: Secondary | ICD-10-CM | POA: Diagnosis not present

## 2020-01-06 DIAGNOSIS — Z980 Intestinal bypass and anastomosis status: Secondary | ICD-10-CM | POA: Insufficient documentation

## 2020-01-06 DIAGNOSIS — Z9884 Bariatric surgery status: Secondary | ICD-10-CM | POA: Diagnosis not present

## 2020-01-06 HISTORY — PX: COLONOSCOPY WITH PROPOFOL: SHX5780

## 2020-01-06 HISTORY — PX: ESOPHAGOGASTRODUODENOSCOPY (EGD) WITH PROPOFOL: SHX5813

## 2020-01-06 LAB — SURGICAL PATHOLOGY

## 2020-01-06 SURGERY — COLONOSCOPY WITH PROPOFOL
Anesthesia: General

## 2020-01-06 MED ORDER — PROPOFOL 10 MG/ML IV BOLUS
INTRAVENOUS | Status: DC | PRN
  Administered 2020-01-06: 10 mg via INTRAVENOUS
  Administered 2020-01-06: 60 mg via INTRAVENOUS
  Administered 2020-01-06: 10 mg via INTRAVENOUS

## 2020-01-06 MED ORDER — LIDOCAINE HCL (CARDIAC) PF 100 MG/5ML IV SOSY
PREFILLED_SYRINGE | INTRAVENOUS | Status: DC | PRN
  Administered 2020-01-06: 100 mg via INTRAVENOUS

## 2020-01-06 MED ORDER — PROPOFOL 500 MG/50ML IV EMUL
INTRAVENOUS | Status: DC | PRN
  Administered 2020-01-06: 160 ug/kg/min via INTRAVENOUS

## 2020-01-06 MED ORDER — FENTANYL CITRATE (PF) 100 MCG/2ML IJ SOLN
INTRAMUSCULAR | Status: DC | PRN
  Administered 2020-01-06: 75 ug via INTRAVENOUS
  Administered 2020-01-06: 25 ug via INTRAVENOUS

## 2020-01-06 MED ORDER — SODIUM CHLORIDE 0.9 % IV SOLN
INTRAVENOUS | Status: DC
  Administered 2020-01-06: 1000 mL via INTRAVENOUS

## 2020-01-06 MED ORDER — FENTANYL CITRATE (PF) 100 MCG/2ML IJ SOLN
INTRAMUSCULAR | Status: AC
  Filled 2020-01-06: qty 2

## 2020-01-06 MED ORDER — GLYCOPYRROLATE 0.2 MG/ML IJ SOLN
INTRAMUSCULAR | Status: DC | PRN
  Administered 2020-01-06: .2 mg via INTRAVENOUS

## 2020-01-06 NOTE — Interval H&P Note (Signed)
History and Physical Interval Note:  01/06/2020 10:48 AM  Paula Garrett  has presented today for surgery, with the diagnosis of PRS HX COLON POLYPS BARRETTS.  The various methods of treatment have been discussed with the patient and family. After consideration of risks, benefits and other options for treatment, the patient has consented to  Procedure(s): COLONOSCOPY WITH PROPOFOL (N/A) ESOPHAGOGASTRODUODENOSCOPY (EGD) WITH PROPOFOL (N/A) as a surgical intervention.  The patient's history has been reviewed, patient examined, no change in status, stable for surgery.  I have reviewed the patient's chart and labs.  Questions were answered to the patient's satisfaction.     [place], [place]

## 2020-01-06 NOTE — H&P (Signed)
Outpatient short stay form Pre-procedure 01/06/2020 10:45 AM Marten Iles K. Alice Reichert, M.D.  Primary Physician: Orland Mustard, M.D.  Reason for visit: Barrett's esophagus, personal history of colon polyps.   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 No further GERD. No weight loss.                           Patient presents for colonoscopy for a personal hx of colon polyps. The patient denies abdominal pain, abnormal weight loss or rectal bleeding.      Current Facility-Administered Medications:  .  0.9 %  sodium chloride infusion, , Intravenous, Continuous, [place], Benay Pike, MD, Last Rate: 20 mL/hr at 01/06/20 1044, 1,000 mL at 01/06/20 1044  Medications Prior to Admission  Medication Sig Dispense Refill Last Dose  . amLODipine (NORVASC) 2.5 MG tablet Take 2.5 mg by mouth daily.   Past Week at Unknown time  . buPROPion (WELLBUTRIN XL) 150 MG 24 hr tablet Take 1 tablet (150 mg total) by mouth daily. 90 tablet 3 01/05/2020 at Unknown time  . cetirizine (ZYRTEC) 10 MG tablet Take 1 tablet (10 mg total) by mouth daily. 90 tablet 3 01/05/2020 at Unknown time  . Cholecalciferol 1.25 MG (50000 UT) capsule Take 1 capsule (50,000 Units total) by mouth once a week. 13 capsule 1 01/05/2020 at Unknown time  . clonazePAM (KLONOPIN) 1 MG tablet Take 1 tablet (1 mg total) by mouth 2 (two) times daily as needed for anxiety. (Patient taking differently: Take 1 mg by mouth 2 (two) times daily as needed for anxiety (Take 1/2 tablet every morning and 1 tablet every evening as needed). ) 60 tablet 5 01/06/2020 at 0700  . Cyanocobalamin (B-12) 1000 MCG SUBL Place 1 tablet under the tongue daily. 90 tablet 3 Past Week at Unknown time  . hydroxychloroquine (PLAQUENIL) 200 MG tablet Take 1 tablet (200 mg total) by mouth 2 (two) times daily.   Past Week at Unknown time  . lisinopril-hydrochlorothiazide (ZESTORETIC) 20-12.5 MG tablet Take 2 tablets by mouth daily. 180 tablet 3  01/05/2020 at Unknown time  . montelukast (SINGULAIR) 10 MG tablet Take 1 tablet (10 mg total) by mouth at bedtime. 90 tablet 3 01/05/2020 at Unknown time  . pantoprazole (PROTONIX) 40 MG tablet Take 1 tablet (40 mg total) by mouth daily. In am 30 min before food 90 tablet 3 01/05/2020 at Unknown time  . promethazine (PHENERGAN) 25 MG tablet    Past Week at Unknown time  . SYNTHROID 200 MCG tablet TAKE 1 TABLET BY MOUTH ONCE daily before breakfast 30 minutes  Further refills from [facility] clinic endocrine Dr. Honor Junes, Marcello Moores no exceptions. Call to make appt endocrine 90 tablet 3 01/05/2020 at Unknown time  . venlafaxine XR (EFFEXOR-XR) 150 MG 24 hr capsule Take 1 capsule (150 mg total) by mouth daily with breakfast. 90 capsule 3 01/05/2020 at Unknown time  . albuterol (VENTOLIN HFA) 108 (90 Base) MCG/ACT inhaler Inhale 2 puffs into the lungs every 6 (six) hours as needed for wheezing or shortness of breath. 18 g 11  at prn  . budesonide-formoterol (SYMBICORT) 80-4.5 MCG/ACT inhaler Inhale 2 puffs into the lungs 2 (two) times daily. Rinse mouth (Patient not taking: Reported on 01/06/2020) 1 Inhaler 11 Not Taking at Unknown time  . Insulin Pen Needle (PEN NEEDLES) 31G X 8 MM MISC 1 Device by Does not apply route daily. (Patient not taking: Reported on 01/06/2020) 90 each 3 Not  Taking at Unknown time  . predniSONE (DELTASONE) 20 MG tablet Take 1 tablet (20 mg total) by mouth daily with breakfast. 7-10 days (Patient not taking: Reported on 01/06/2020) 10 tablet 0 Completed Course at Unknown time  . sucralfate (CARAFATE) 1 g tablet Take 1 g by mouth 2 (two) times daily. (Patient not taking: Reported on 01/06/2020)   Not Taking at Unknown time     No Known Allergies   Past Medical History:  Diagnosis Date  . Anxiety   . Arthritis   . Asthma   . Barrett's esophagus 2015  . Depression   . GERD (gastroesophageal reflux disease)   . Hypertension   . Hypothyroidism   . Pneumonia   . Thyroid disease      Review of systems:  Otherwise negative.    Physical Exam  Gen: Alert, oriented. Appears stated age.  HEENT: [place]/AT. PERRLA. Lungs: CTA, no wheezes. CV: RR nl S1, S2. Abd: soft, benign, no masses. BS+ Ext: No edema. Pulses 2+    Planned procedures: Proceed with EGD and colonoscopy. The patient understands the nature of the planned procedure, indications, risks, alternatives and potential complications including but not limited to bleeding, infection, perforation, damage to internal organs and possible oversedation/side effects from anesthesia. The patient agrees and gives consent to proceed.  Please refer to procedure notes for findings, recommendations and patient disposition/instructions.     Ethel Meisenheimer K. Alice Reichert, M.D. Gastroenterology 01/06/2020  10:45 AM

## 2020-01-06 NOTE — Op Note (Signed)
[facility] Gastroenterology Patient Name: Paula Garrett Procedure Date: 01/06/2020 10:46 AM MRN: [id number] Account #: [id number] Date of Birth: 29-Sep-1965 Admit Type: Outpatient Age: 54 Room: [facility] ENDO ROOM 2 Gender: Female Note Status: Finalized Procedure:             Colonoscopy Indications:           Surveillance: Personal history of adenomatous polyps                         on last colonoscopy > 5 years ago Providers:             Lorie Apley K. Jaqwon Manfred MD, MD Medicines:             Propofol per Anesthesia Complications:         No immediate complications. Procedure:             Pre-Anesthesia Assessment:                        - The risks and benefits of the procedure and the                         sedation options and risks were discussed with the                         patient. All questions were answered and informed                         consent was obtained.                        - Patient identification and proposed procedure were                         verified prior to the procedure by the nurse. The                         procedure was verified in the procedure room.                        - ASA Grade Assessment: III - A patient with severe                         systemic disease.                        - After reviewing the risks and benefits, the patient                         was deemed in satisfactory condition to undergo the                         procedure.                        After obtaining informed consent, the colonoscope was                         passed under direct vision. Throughout the procedure,  the patient's blood pressure, pulse, and oxygen                         saturations were monitored continuously. The                         Colonoscope was introduced through the anus and                         advanced to the the cecum, identified by appendiceal                         orifice and  ileocecal valve. The colonoscopy was                         performed without difficulty. The patient tolerated                         the procedure well. The quality of the bowel                         preparation was excellent. The ileocecal valve,                         appendiceal orifice, and rectum were photographed. Findings:      The perianal and digital rectal examinations were normal. Pertinent       negatives include normal sphincter tone and no palpable rectal lesions.      Many small-mouthed diverticula were found in the sigmoid colon.      The exam was otherwise without abnormality on direct and retroflexion       views. Impression:            - Diverticulosis in the sigmoid colon.                        - The examination was otherwise normal on direct and                         retroflexion views.                        - No specimens collected. Recommendation:        - Await pathology results from EGD, also performed                         today.                        - Patient has a contact number available for                         emergencies. The signs and symptoms of potential                         delayed complications were discussed with the patient.                         Return to normal activities tomorrow. Written  discharge instructions were provided to the patient.                        - Resume previous diet.                        - Continue present medications.                        - Repeat colonoscopy in 5 years for surveillance.                        - Return to GI office PRN.                        - The findings and recommendations were discussed with                         the patient.                        - The findings and recommendations were discussed with                         the patient. Procedure Code(s):     --- Professional ---                        E0814, Colorectal cancer screening;  colonoscopy on                         individual at high risk Diagnosis Code(s):     --- Professional ---                        K57.30, Diverticulosis of large intestine without                         perforation or abscess without bleeding                        Z86.010, Personal history of colonic polyps CPT copyright 2019 [employer]. All rights reserved. The codes documented in this report are preliminary and upon coder review may  be revised to meet current compliance requirements. Efrain Sella MD, MD 01/06/2020 11:20:20 AM This report has been signed electronically. Number of Addenda: 0 Note Initiated On: 01/06/2020 10:46 AM Scope Withdrawal Time: 0 hours 6 minutes 7 seconds  Total Procedure Duration: 0 hours 9 minutes 15 seconds  Estimated Blood Loss:  Estimated blood loss: none.      [facility]

## 2020-01-06 NOTE — Op Note (Signed)
[facility] Gastroenterology Patient Name: Paula Garrett Procedure Date: 01/06/2020 10:47 AM MRN: [id number] Account #: [id number] Date of Birth: [birth year] Admit Type: Outpatient Age: 54 Room: [facility] ENDO ROOM 2 Gender: Female Note Status: Finalized Procedure:             Upper GI endoscopy Indications:           Surveillance for malignancy due to personal history of                         Barrett's esophagus Providers:             Benay Pike. Alice Reichert MD, MD Referring MD:          Nino Glow Mclean-Scocuzza MD, MD (Referring MD) Medicines:             Propofol per Anesthesia Complications:         No immediate complications. Procedure:             Pre-Anesthesia Assessment:                        - The risks and benefits of the procedure and the                         sedation options and risks were discussed with the                         patient. All questions were answered and informed                         consent was obtained.                        - Patient identification and proposed procedure were                         verified prior to the procedure by the nurse. The                         procedure was verified in the procedure room.                        - ASA Grade Assessment: III - A patient with severe                         systemic disease.                        - After reviewing the risks and benefits, the patient                         was deemed in satisfactory condition to undergo the                         procedure.                        After obtaining informed consent, the endoscope was                         passed under direct  vision. Throughout the procedure,                         the patient's blood pressure, pulse, and oxygen                         saturations were monitored continuously. The Endoscope                         was introduced through the mouth, and advanced to the                         third part of  duodenum. The upper GI endoscopy was                         accomplished without difficulty. The patient tolerated                         the procedure well. Findings:      There were esophageal mucosal changes secondary to established       long-segment Barrett's disease present in the distal esophagus. The       maximum longitudinal extent of these mucosal changes was 4 cm in length.       Mucosa was biopsied with a cold forceps for histology in 4 quadrants at       intervals of 2 cm from 27 to 31 cm from the incisors. A total of 2       specimen bottles were sent to pathology.      Evidence of a Roux-en-Y gastrojejunostomy was found. The gastrojejunal       anastomosis was characterized by healthy appearing mucosa. The       duodenum-to-jejunum limb was not examined as it could not be traversed.      The exam was otherwise without abnormality.      The examined jejunum was normal. Impression:            - Esophageal mucosal changes secondary to established                         long-segment Barrett's disease. Biopsied.                        - Roux-en-Y gastrojejunostomy with gastrojejunal                         anastomosis characterized by healthy appearing mucosa.                        - The examination was otherwise normal.                        - Normal examined jejunum. Recommendation:        - Await pathology results.                        - Proceed with colonoscopy Procedure Code(s):     --- Professional ---                        [phone], Esophagogastroduodenoscopy, flexible,  transoral; with biopsy, single or multiple Diagnosis Code(s):     --- Professional ---                        Z98.0, Intestinal bypass and anastomosis status                        K22.70, Barrett's esophagus without dysplasia CPT copyright 2019 American Medical Association. All rights reserved. The codes documented in this report are preliminary and upon coder review may   be revised to meet current compliance requirements. Efrain Sella MD, MD 01/06/2020 11:06:27 AM This report has been signed electronically. Number of Addenda: 0 Note Initiated On: 01/06/2020 10:47 AM Estimated Blood Loss:  Estimated blood loss: none.      [facility]

## 2020-01-06 NOTE — Transfer of Care (Signed)
Immediate Anesthesia Transfer of Care Note  Patient: Paula Garrett  Procedure(s) Performed: COLONOSCOPY WITH PROPOFOL (N/A ) ESOPHAGOGASTRODUODENOSCOPY (EGD) WITH PROPOFOL (N/A )  Patient Location: PACU  Anesthesia Type:General  Level of Consciousness: drowsy  Airway & Oxygen Therapy: Patient Spontanous Breathing and Patient connected to nasal cannula oxygen  Post-op Assessment: Report given to RN and Post -op Vital signs reviewed and stable  Post vital signs: Reviewed and stable  Last Vitals:  Vitals Value Taken Time  BP 104/65 01/06/20 1120  Temp    Pulse 72 01/06/20 1120  Resp 22 01/06/20 1120  SpO2 98 % 01/06/20 1120  Vitals shown include unvalidated device data.  Last Pain:  Vitals:   01/06/20 1120  TempSrc:   PainSc: 0-No pain         Complications: No complications documented.

## 2020-01-06 NOTE — Anesthesia Preprocedure Evaluation (Signed)
Anesthesia Evaluation  Patient identified by MRN, date of birth, ID band Patient awake    Reviewed: Allergy & Precautions, NPO status , Patient's Chart, lab work & pertinent test results  History of Anesthesia Complications Negative for: history of anesthetic complications  Airway Mallampati: III  TM Distance: <3 FB Neck ROM: Full    Dental no notable dental hx. (+) Teeth Intact   Pulmonary asthma , sleep apnea , neg pneumonia , COPD,  COPD inhaler, Current SmokerPatient did not abstain from smoking.,  Likely undiagnosed, not on cpap   Pulmonary exam normal breath sounds clear to auscultation       Cardiovascular Exercise Tolerance: Good METShypertension, Pt. on medications (-) CAD and (-) Past MI Normal cardiovascular exam(-) dysrhythmias  Rhythm:Regular Rate:Normal - Systolic murmurs    Neuro/Psych  Headaches, PSYCHIATRIC DISORDERS Anxiety Depression    GI/Hepatic Neg liver ROS, GERD  Medicated and Controlled,  Endo/Other  neg diabetesHypothyroidism   Renal/GU negative Renal ROS  Female GU complaint  negative genitourinary   Musculoskeletal  (+) Arthritis , Osteoarthritis,    Abdominal Normal abdominal exam  (+) + obese,   Peds negative pediatric ROS (+)  Hematology  (+) anemia ,   Anesthesia Other Findings Hx of endometrial ablation Past Medical History: No date: Anxiety No date: Arthritis No date: Asthma 2015: Barrett's esophagus No date: Depression No date: GERD (gastroesophageal reflux disease) No date: Hypertension No date: Hypothyroidism No date: Pneumonia No date: Thyroid disease   Reproductive/Obstetrics                             Anesthesia Physical  Anesthesia Plan  ASA: III  Anesthesia Plan: General   Post-op Pain Management:    Induction: Intravenous  PONV Risk Score and Plan: 2 and Ondansetron, Propofol infusion and TIVA  Airway Management Planned:  Nasal Cannula  Additional Equipment: None  Intra-op Plan:   Post-operative Plan:   Informed Consent: I have reviewed the patients History and Physical, chart, labs and discussed the procedure including the risks, benefits and alternatives for the proposed anesthesia with the patient or authorized representative who has indicated his/her understanding and acceptance.     Dental advisory given  Plan Discussed with: CRNA and Surgeon  Anesthesia Plan Comments: (Discussed risks of anesthesia with patient, including possibility of difficulty with spontaneous ventilation under anesthesia necessitating airway intervention, PONV, and rare risks such as cardiac or respiratory or neurological events. Patient understands.)        Anesthesia Quick Evaluation

## 2020-01-06 NOTE — Anesthesia Postprocedure Evaluation (Signed)
Anesthesia Post Note  Patient: Paula Garrett  Procedure(s) Performed: COLONOSCOPY WITH PROPOFOL (N/A ) ESOPHAGOGASTRODUODENOSCOPY (EGD) WITH PROPOFOL (N/A )  Patient location during evaluation: Endoscopy Anesthesia Type: General Level of consciousness: awake and alert Pain management: pain level controlled Vital Signs Assessment: post-procedure vital signs reviewed and stable Respiratory status: spontaneous breathing, nonlabored ventilation, respiratory function stable and patient connected to nasal cannula oxygen Cardiovascular status: blood pressure returned to baseline and stable Postop Assessment: no apparent nausea or vomiting Anesthetic complications: no   No complications documented.   Last Vitals:  Vitals:   01/06/20 1028 01/06/20 1120  BP: (!) 165/98 104/65  Pulse: 81 73  Resp: 16 (!) 6  Temp: (!) 35.6 C   SpO2: 98% 97%    Last Pain:  Vitals:   01/06/20 1120  TempSrc:   PainSc: 0-No pain                 Arita Miss

## 2020-01-07 ENCOUNTER — Encounter: Payer: Self-pay | Admitting: Internal Medicine

## 2020-01-17 ENCOUNTER — Encounter: Payer: Self-pay | Admitting: Internal Medicine

## 2020-02-02 ENCOUNTER — Ambulatory Visit (INDEPENDENT_AMBULATORY_CARE_PROVIDER_SITE_OTHER): Payer: Managed Care, Other (non HMO) | Admitting: Internal Medicine

## 2020-02-02 ENCOUNTER — Ambulatory Visit (INDEPENDENT_AMBULATORY_CARE_PROVIDER_SITE_OTHER): Payer: Managed Care, Other (non HMO)

## 2020-02-02 ENCOUNTER — Encounter: Payer: Self-pay | Admitting: Internal Medicine

## 2020-02-02 ENCOUNTER — Other Ambulatory Visit: Payer: Self-pay

## 2020-02-02 VITALS — BP 118/78 | HR 94 | Temp 98.1°F | Ht 63.0 in | Wt 219.0 lb

## 2020-02-02 DIAGNOSIS — Z23 Encounter for immunization: Secondary | ICD-10-CM | POA: Diagnosis not present

## 2020-02-02 DIAGNOSIS — M25561 Pain in right knee: Secondary | ICD-10-CM | POA: Diagnosis not present

## 2020-02-02 DIAGNOSIS — F419 Anxiety disorder, unspecified: Secondary | ICD-10-CM | POA: Diagnosis not present

## 2020-02-02 DIAGNOSIS — G8929 Other chronic pain: Secondary | ICD-10-CM

## 2020-02-02 DIAGNOSIS — T792XXD Traumatic secondary and recurrent hemorrhage and seroma, subsequent encounter: Secondary | ICD-10-CM

## 2020-02-02 DIAGNOSIS — Z1231 Encounter for screening mammogram for malignant neoplasm of breast: Secondary | ICD-10-CM

## 2020-02-02 DIAGNOSIS — G4700 Insomnia, unspecified: Secondary | ICD-10-CM | POA: Diagnosis not present

## 2020-02-02 DIAGNOSIS — R2241 Localized swelling, mass and lump, right lower limb: Secondary | ICD-10-CM

## 2020-02-02 DIAGNOSIS — E669 Obesity, unspecified: Secondary | ICD-10-CM

## 2020-02-02 DIAGNOSIS — R7303 Prediabetes: Secondary | ICD-10-CM | POA: Diagnosis not present

## 2020-02-02 DIAGNOSIS — E538 Deficiency of other specified B group vitamins: Secondary | ICD-10-CM

## 2020-02-02 DIAGNOSIS — E559 Vitamin D deficiency, unspecified: Secondary | ICD-10-CM

## 2020-02-02 DIAGNOSIS — M25512 Pain in left shoulder: Secondary | ICD-10-CM

## 2020-02-02 DIAGNOSIS — M1990 Unspecified osteoarthritis, unspecified site: Secondary | ICD-10-CM

## 2020-02-02 LAB — POCT GLYCOSYLATED HEMOGLOBIN (HGB A1C): Hemoglobin A1C: 5.3 % (ref 4.0–5.6)

## 2020-02-02 MED ORDER — CYANOCOBALAMIN 1000 MCG/ML IJ SOLN: 1000.0000 ug | INTRAMUSCULAR | 12 refills | Status: DC

## 2020-02-02 MED ORDER — "NEEDLE (DISP) 25G X 1-1/2"" MISC": 1.0000 | 2 refills | Status: DC

## 2020-02-02 MED ORDER — CHOLECALCIFEROL 1.25 MG (50000 UT) PO CAPS: 50000.0000 [IU] | ORAL_CAPSULE | ORAL | 3 refills | Status: DC

## 2020-02-02 MED ORDER — CYANOCOBALAMIN 1000 MCG/ML IJ SOLN: 1000.0000 ug | Freq: Once | INTRAMUSCULAR | 0 refills | Status: DC

## 2020-02-02 MED ORDER — CLONAZEPAM 1 MG PO TABS: 1.0000 mg | ORAL_TABLET | Freq: Two times a day (BID) | ORAL | 5 refills | Status: DC | PRN

## 2020-02-02 NOTE — Progress Notes (Addendum)
Chief Complaint  Patient presents with  . Leg Problem    right leg swelling/lump from fall   F/u  1. 11/2019 fell down stairs and bumped leg and has right upper shin knot which is painful if she rubs the area and c/o right knee pain 7/10 and c/o left shoulder pain Xray negative  2. Prediabetes repeat A1C 5.3 today she has lost 20 lbs in 6 weeks and [place] [facility] may try her on [facility] upcoming appt  3. Hypothyroidism tsh was 0.178 11/24/19 Dr. Jeanette Caprice endocrine  4. B12 was 202 agreeable to do home B12 injections 5. Inflammatory arthritis on plaquinel 200 mg bid   Review of Systems  Constitutional: Positive for weight loss.  HENT: Negative for hearing loss.   Eyes: Negative for blurred vision.  Respiratory: Negative for shortness of breath.   Cardiovascular: Negative for chest pain.  Gastrointestinal: Negative for abdominal pain.  Musculoskeletal: Negative for falls.  Skin: Negative for rash.  Neurological: Negative for headaches.  Psychiatric/Behavioral: Negative for depression. The patient is not nervous/anxious.    Past Medical History:  Diagnosis Date  . Anxiety   . Arthritis   . Asthma   . Barrett's esophagus 2015  . Depression   . GERD (gastroesophageal reflux disease)   . Hypertension   . Hypothyroidism   . Pneumonia   . Thyroid disease    Past Surgical History:  Procedure Laterality Date  . ABLATION  2006  . BREAST CYST ASPIRATION Left 1998  . BREAST SURGERY     cyst aspiration  . BREAST SURGERY  1998   cyst aspiration  . COLONOSCOPY  03/2014  . COLONOSCOPY WITH PROPOFOL N/A 01/06/2020   Procedure: COLONOSCOPY WITH PROPOFOL;  Surgeon: Toledo, Benay Pike, MD;  Location: [facility] ENDOSCOPY;  Service: Gastroenterology;  Laterality: N/A;  . ENDOMETRIAL ABLATION    . ESOPHAGOGASTRODUODENOSCOPY (EGD) WITH PROPOFOL N/A 08/15/2015   Procedure: ESOPHAGOGASTRODUODENOSCOPY (EGD) WITH PROPOFOL;  Surgeon: Lollie Sails, MD;  Location: [facility] ENDOSCOPY;  Service: Endoscopy;   Laterality: N/A;  . ESOPHAGOGASTRODUODENOSCOPY (EGD) WITH PROPOFOL N/A 01/22/2016   Procedure: ESOPHAGOGASTRODUODENOSCOPY (EGD) WITH PROPOFOL;  Surgeon: Lollie Sails, MD;  Location: [facility] ENDOSCOPY;  Service: Endoscopy;  Laterality: N/A;  . ESOPHAGOGASTRODUODENOSCOPY (EGD) WITH PROPOFOL N/A 01/06/2020   Procedure: ESOPHAGOGASTRODUODENOSCOPY (EGD) WITH PROPOFOL;  Surgeon: Toledo, Benay Pike, MD;  Location: [facility] ENDOSCOPY;  Service: Gastroenterology;  Laterality: N/A;  . GASTRIC BYPASS  2005  . HERNIA REPAIR    . NASAL SINUS SURGERY    . 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 TOTAL THYROIDECTOMY    . TUBAL LIGATION     Family History  Problem Relation Age of Onset  . Heart disease Mother   . COPD Mother   . Arthritis Mother   . Cancer Mother        uterine  . Lupus Mother   . Anxiety disorder Mother   . Depression Mother   . Drug abuse Mother   . COPD Father   . Arthritis Father   . Heart disease Father   . Heart attack Father   . Alcohol abuse Father   . Heart disease Brother   . Heart attack Brother   . Drug abuse Brother   . Anxiety disorder Brother   . Depression Brother   . Heart disease Brother   . Cancer Brother        liver cancer age 94 y.o  . Diabetes Maternal Grandmother   . Diabetes  Maternal Grandfather   . Diabetes Paternal Grandmother   . Diabetes Paternal Grandfather    Social History   Socioeconomic History  . Marital status: Divorced    Spouse name: Not on file  . Number of children: 2  . Years of education: Not on file  . Highest education level: High school graduate  Occupational History  . Not on file  Tobacco Use  . Smoking status: Current Every Day Smoker    Packs/day: 1.00    Years: 30.00    Pack years: 30.00    Types: Cigarettes    Start date: 03/15/1980  . Smokeless tobacco: Never Used  Vaping Use  . Vaping Use: Never used  Substance and Sexual Activity  . Alcohol use: Yes    Alcohol/week: 10.0 - 12.0 standard drinks    Types:  8 - 10 Glasses of wine, 2 Standard drinks or equivalent per week  . Drug use: No  . Sexual activity: Yes    Partners: Male  Other Topics Concern  . Not on file  Social History Narrative   Lives in [place] single, divorced       Work - 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       Diet - healthy diet   Exercise - limited   Caffeine use: daily   Social Determinants of Health   Financial Resource Strain:   . Difficulty of Paying Living Expenses:   Food Insecurity:   . Worried About Charity fundraiser in the Last Year:   . Arboriculturist in the Last Year:   Transportation Needs:   . Film/video editor (Medical):   Marland Kitchen Lack of Transportation (Non-Medical):   Physical Activity:   . Days of Exercise per Week:   . Minutes of Exercise per Session:   Stress:   . Feeling of Stress :   Social Connections:   . Frequency of Communication with Friends and Family:   . Frequency of Social Gatherings with Friends and Family:   . Attends Religious Services:   . Active Member of Clubs or Organizations:   . Attends Archivist Meetings:   Marland Kitchen Marital Status:   Intimate Partner Violence:   . Fear of Current or Ex-Partner:   . Emotionally Abused:   Marland Kitchen Physically Abused:   . Sexually Abused:    Current Meds  Medication Sig  . albuterol (VENTOLIN HFA) 108 (90 Base) MCG/ACT inhaler Inhale 2 puffs into the lungs every 6 (six) hours as needed for wheezing or shortness of breath.  . budesonide-formoterol (SYMBICORT) 80-4.5 MCG/ACT inhaler Inhale 2 puffs into the lungs 2 (two) times daily. Rinse mouth  . buPROPion (WELLBUTRIN XL) 150 MG 24 hr tablet Take 1 tablet (150 mg total) by mouth daily.  . cetirizine (ZYRTEC) 10 MG tablet Take 1 tablet (10 mg total) by mouth daily.  . Cholecalciferol 1.25 MG (50000 UT) capsule Take 1 capsule (50,000 Units total) by mouth once a week.  . clonazePAM (KLONOPIN) 1 MG tablet Take 1 tablet (1 mg total) by mouth 2 (two) times daily as needed for anxiety  (Take 1/2 tablet every morning and 1 tablet every evening as needed).  . hydroxychloroquine (PLAQUENIL) 200 MG tablet Take 1 tablet (200 mg total) by mouth 2 (two) times daily.  Marland Kitchen lisinopril-hydrochlorothiazide (ZESTORETIC) 20-12.5 MG tablet Take 2 tablets by mouth daily.  . montelukast (SINGULAIR) 10 MG tablet Take 1 tablet (10 mg total) by mouth at bedtime.  . pantoprazole (PROTONIX) 40  MG tablet Take 1 tablet (40 mg total) by mouth daily. In am 30 min before food  . SYNTHROID 200 MCG tablet TAKE 1 TABLET BY MOUTH ONCE daily before breakfast 30 minutes  Further refills from [facility] clinic endocrine Dr. Honor Junes, Marcello Moores no exceptions. Call to make appt endocrine  . venlafaxine XR (EFFEXOR-XR) 150 MG 24 hr capsule Take 1 capsule (150 mg total) by mouth daily with breakfast.  . [DISCONTINUED] Cholecalciferol 1.25 MG (50000 UT) capsule Take 1 capsule (50,000 Units total) by mouth once a week.  . [DISCONTINUED] clonazePAM (KLONOPIN) 1 MG tablet Take 1 tablet (1 mg total) by mouth 2 (two) times daily as needed for anxiety. (Patient taking differently: Take 1 mg by mouth 2 (two) times daily as needed for anxiety (Take 1/2 tablet every morning and 1 tablet every evening as needed). )  . [DISCONTINUED] Cyanocobalamin (B-12) 1000 MCG SUBL Place 1 tablet under the tongue daily.   No Known Allergies Recent Results (from the past 2160 hour(s))  SARS CORONAVIRUS 2 (TAT 6-24 HRS) Nasopharyngeal Nasopharyngeal Swab     Status: None   Collection Time: 01/04/20  9:39 AM   Specimen: Nasopharyngeal Swab  Result Value Ref Range   SARS Coronavirus 2 NEGATIVE NEGATIVE    Comment: (NOTE) SARS-CoV-2 target nucleic acids are NOT DETECTED.  The SARS-CoV-2 RNA is generally detectable in upper and lower respiratory specimens during the acute phase of infection. Negative results do not preclude SARS-CoV-2 infection, do not rule out co-infections with other pathogens, and should not be used as the sole basis for  treatment or other patient management decisions. Negative results must be combined with clinical observations, patient history, and epidemiological information. The expected result is Negative.  Fact Sheet for Patients: SugarRoll.be  Fact Sheet for Healthcare Providers: https://www.woods-mathews.com/  This test is not yet approved or cleared by the [place] FDA and  has been authorized for detection and/or diagnosis of SARS-CoV-2 by FDA under an Emergency Use Authorization (EUA). This EUA will remain  in effect (meaning this test can be used) for the duration of the COVID-19 declaration under Se ction 564(b)(1) of the Act, 21 U.S.C. section 360bbb-3(b)(1), unless the authorization is terminated or revoked sooner.  Performed at [facility], [place] [address], [place], [place] [address]   Surgical pathology     Status: None   Collection Time: 01/06/20 11:00 AM  Result Value Ref Range   SURGICAL PATHOLOGY      SURGICAL PATHOLOGY CASE: [id number] PATIENT: Paula Garrett Surgical Pathology Report     Specimen Submitted: A. Esophagus, '@27'  cm; cbx B. Esophagus, '@29'  cm; cbx  Clinical History: PRS HX colon polyps, Barrett's.  Barrett's esophagus; post surgical anatomical changes; diverticulosis; hemorrhoids      DIAGNOSIS: A. ESOPHAGUS, 27 CM; COLD BIOPSY: - BARRETT'S ESOPHAGUS. - NEGATIVE FOR DYSPLASIA AND MALIGNANCY.  B. ESOPHAGUS, 29 CM; COLD BIOPSY: - BARRETT'S ESOPHAGUS. - NEGATIVE FOR DYSPLASIA AND MALIGNANCY.   GROSS DESCRIPTION: A. Labeled: cbx esophagus at 27 cm Received: Formalin Tissue fragment(s): Multiple Size: Aggregate, 1 x 0.4 x 0.2 cm Description: Tan soft tissue fragments Entirely submitted in 1 cassette.  B. Labeled: cbx esophagus at 29 cm Received: Formalin Tissue fragment(s): Multiple Size: Aggregate, 0.8 x 0.5 x 0.2 cm Description: Tan soft tissue fragments Entirely submitted in 1  cassette.    Final Diagnosis performed by Betsy Pries, MD.   Electronically signed 01/10/2020 2:16:25PM The electronic signature indicates that the named Attending Pathologist has evaluated the specimen  Technical component performed at [facility], [address], [place], [place] [address] Lab: [id number] Dir: Rush Farmer, MD, MMM  Professional component performed at [facility], [facility], [place], [place],  [address] Lab: [phone] Dir: Dellia Nims. Rubinas, MD   POCT glycosylated hemoglobin (Hb A1C)     Status: Normal   Collection Time: 02/02/20  9:32 AM  Result Value Ref Range   Hemoglobin A1C 5.3 4.0 - 5.6 %   HbA1c POC (<> result, manual entry)     HbA1c, POC (prediabetic range)     HbA1c, POC (controlled diabetic range)     Objective  Body mass index is 38.79 kg/m. Wt Readings from Last 3 Encounters:  02/02/20 219 lb (99.3 kg)  01/06/20 (!) 222 lb 7.1 oz (100.9 kg)  10/07/19 233 lb 6.4 oz (105.9 kg)   Temp Readings from Last 3 Encounters:  02/02/20 98.1 F (36.7 C) (Oral)  01/06/20 (!) 96 F (35.6 C) (Temporal)  10/07/19 97.7 F (36.5 C) (Oral)   BP Readings from Last 3 Encounters:  02/02/20 118/78  01/06/20 104/65  10/07/19 124/82   Pulse Readings from Last 3 Encounters:  02/02/20 94  01/06/20 73  10/07/19 92    Physical Exam Vitals and nursing note reviewed.  Constitutional:      Appearance: Normal appearance. She is well-developed and well-groomed. She is obese.  HENT:     Head: Normocephalic and atraumatic.  Eyes:     Conjunctiva/sclera: Conjunctivae normal.     Pupils: Pupils are equal, round, and reactive to light.  Cardiovascular:     Rate and Rhythm: Normal rate and regular rhythm.     Heart sounds: Normal heart sounds. No murmur heard.   Pulmonary:     Effort: Pulmonary effort is normal.     Breath sounds: Normal breath sounds.  Musculoskeletal:       Legs:  Skin:    General: Skin is warm and dry.    Neurological:     General: No focal deficit present.     Mental Status: She is alert and oriented to person, place, and time. Mental status is at baseline.     Gait: Gait normal.  Psychiatric:        Attention and Perception: Attention and perception normal.        Mood and Affect: Mood and affect normal.        Speech: Speech normal.        Behavior: Behavior normal. Behavior is cooperative.        Thought Content: Thought content normal.        Cognition and Memory: Cognition and memory normal.        Judgment: Judgment normal.     Assessment  Plan  Chronic pain of right knee - Plan: DG Knee Complete 4 Views Right  Vitamin D deficiency - Plan: Cholecalciferol 1.25 MG (50000 UT) capsule  Anxiety - Plan: clonazePAM (KLONOPIN) 1 MG tablet Insomnia, unspecified type - Plan: clonazePAM (KLONOPIN) 1 MG tablet  B12 deficiency - Plan: NEEDLE, DISP, 25 G 25G X 1-1/2" MISC, cyanocobalamin (,VITAMIN B-12,) 1000 MCG/ML injection  Prediabetes - Plan: POCT glycosylated hemoglobin (Hb A1C)  Lower leg mass, right - Plan: US SOFT TISSUE LOWER EXTREMITY LIMITED RIGHT (NON-VASCULAR) Consider ortho to drain   Chronic left shoulder pain Consider MRI left shoulder and ortho   Inflammatory arthritis Cont plaquenil 200 mg bid  Will needed yearly eye exam   Obesity (BMI 30-39.9)  Continue healthy diet and  exercise   HM Flu shothad1/27/21 pna 23 had 03/2012, given today 02/02/20   covid shot had 2/2 pfizer disc booster today   Tdap UTD10/25/13 rec hepBvaccinefor future(consider new x 2 doses) Consider MMR in future  mammo2/10/21 neg, ordered  Papneg 11/20/17 no HPV EGD/colonoscopy had [facility] , need to get report colonoscopy 10/10/15[facility] Also EGD due 01/22/2019 barretts last 01/21/14 no dysplasia  EGD/colonoscopy [facility] 01/06/20 Dr. Alice Reichert EGD/Colonoscopy barretts neg bx f/u in 3 years egd and 5 years colonoscopy   Skin 02/02/20 no issues  rec smoking cesasationand exercise  to lose with healthy diet choiceswaist 46.5 in today F/u [facility] Dr. Asa Lente connell may get on wegovy  Anxiety - Plan: clonazePAM (KLONOPIN) 1 MG tablet Insomnia, unspecified type - Plan: clonazePAM (KLONOPIN) 1 MG tablet bid prn Anxiety and depression - Plan: buPROPion (WELLBUTRIN XL) 150 MG 24 hr tablet to effexor 150 mg qd -consider psych Dr. Harlene Ramus in [place] vs [place]  Vitamin D deficiency - Plan: Cholecalciferol 1.25 MG (50000 UT) capsule  Vitamin B12 deficiency - Plan: Cyanocobalamin (B-12) 1000 MCG SUBL  Essential hypertension - Plan: lisinopril-hydrochlorothiazide (ZESTORETIC) 20-12.5 MG tablet x 2 pills   Controlled   Hypothyroidism, unspecified type Cont meds  F/u [facility]  Inflammatory arthritis  F/u Dr. Amil Amen On steroids qd x 30 days completed course  Plaquenil 200 bid    Provider: Dr. Olivia Mackie McLean-Scocuzza-Internal Medicine

## 2020-02-03 MED ORDER — CLONAZEPAM 1 MG PO TABS: 1.0000 mg | ORAL_TABLET | Freq: Two times a day (BID) | ORAL | 5 refills | Status: DC | PRN

## 2020-02-03 NOTE — Addendum Note (Signed)
Addended by: Orland Mustard on: 02/03/2020 12:46 PM   Modules accepted: Orders

## 2020-02-07 ENCOUNTER — Ambulatory Visit
Admission: RE | Admit: 2020-02-07 | Discharge: 2020-02-07 | Disposition: A | Discharge status: Home / Self Care | Payer: Managed Care, Other (non HMO) | Source: Ambulatory Visit | Attending: Internal Medicine | Admitting: Internal Medicine

## 2020-02-07 ENCOUNTER — Other Ambulatory Visit: Payer: Self-pay

## 2020-02-07 DIAGNOSIS — R2241 Localized swelling, mass and lump, right lower limb: Secondary | ICD-10-CM | POA: Diagnosis not present

## 2020-02-09 ENCOUNTER — Encounter: Payer: Self-pay | Admitting: Internal Medicine

## 2020-02-09 DIAGNOSIS — M2550 Pain in unspecified joint: Secondary | ICD-10-CM

## 2020-02-09 DIAGNOSIS — M1990 Unspecified osteoarthritis, unspecified site: Secondary | ICD-10-CM

## 2020-02-09 DIAGNOSIS — S8011XD Contusion of right lower leg, subsequent encounter: Secondary | ICD-10-CM | POA: Insufficient documentation

## 2020-02-11 NOTE — Telephone Encounter (Signed)
Received fax requesting patient have a repeat BMP. Please advise   Dx codes given: M25.50, M19.90

## 2020-02-14 NOTE — Telephone Encounter (Signed)
It is okay to get her scheduled for the labs.  Please place the order.  It also looks like the B12 was refilled for 4 mL which should be 4 individual doses and it looks like there were 12 refills attached to that.  I am not sure why the pharmacy only gave her 1 vial.  Please follow-up with the patient regarding that.

## 2020-02-22 ENCOUNTER — Encounter: Payer: Self-pay | Admitting: Internal Medicine

## 2020-02-29 NOTE — Addendum Note (Signed)
Addended by: Orland Mustard on: 02/29/2020 12:45 AM   Modules accepted: Orders

## 2020-03-09 ENCOUNTER — Encounter: Payer: Self-pay | Admitting: Internal Medicine

## 2020-03-09 ENCOUNTER — Telehealth: Payer: Self-pay | Admitting: Internal Medicine

## 2020-03-10 NOTE — Telephone Encounter (Signed)
err

## 2020-04-12 ENCOUNTER — Telehealth: Payer: Self-pay | Admitting: Internal Medicine

## 2020-04-12 NOTE — Telephone Encounter (Signed)
Rejection Reason - Patient Declined - Pt has a balance of $1436 on her account. Pt is aware but is unable to get some of this paid down. Unable to schedule at this time " [facility] said on Apr 12, 2020 12:26 PM  "Pt will set up a payment plan when she can afford to do so. She understands she cannot be seen until then" [facility] said on Mar 21, 2020 3:11 PM  "she is aware that she has to speak to the financial department in order to schedule appt." [facility] said on Feb 29, 2020 2:36 PM

## 2020-04-13 NOTE — Telephone Encounter (Signed)
Good afternoon!  I spoke with pt she is willing to go to [facility]. Need new referral please and Thank you!

## 2020-04-13 NOTE — Addendum Note (Signed)
Addended by: Orland Mustard on: 04/13/2020 09:15 PM   Modules accepted: Orders

## 2020-04-13 NOTE — Telephone Encounter (Signed)
Thank you new referral sent

## 2020-04-13 NOTE — Telephone Encounter (Signed)
Ask pt if wants referral [facility] in reference to ortho referral 02/2020  This issue needs to be addressed   Thanks [place]

## 2020-04-14 NOTE — Telephone Encounter (Signed)
Your welcome received and sent.

## 2020-05-03 ENCOUNTER — Other Ambulatory Visit: Payer: Self-pay

## 2020-05-05 ENCOUNTER — Other Ambulatory Visit: Payer: Self-pay

## 2020-05-05 ENCOUNTER — Encounter: Payer: Self-pay | Admitting: Internal Medicine

## 2020-05-05 ENCOUNTER — Ambulatory Visit: Payer: Managed Care, Other (non HMO) | Admitting: Internal Medicine

## 2020-05-05 VITALS — BP 136/70 | HR 81 | Temp 97.5°F | Ht 63.0 in | Wt 203.1 lb

## 2020-05-05 DIAGNOSIS — F339 Major depressive disorder, recurrent, unspecified: Secondary | ICD-10-CM | POA: Insufficient documentation

## 2020-05-05 DIAGNOSIS — E039 Hypothyroidism, unspecified: Secondary | ICD-10-CM

## 2020-05-05 DIAGNOSIS — I10 Essential (primary) hypertension: Secondary | ICD-10-CM

## 2020-05-05 DIAGNOSIS — F419 Anxiety disorder, unspecified: Secondary | ICD-10-CM | POA: Diagnosis not present

## 2020-05-05 DIAGNOSIS — E669 Obesity, unspecified: Secondary | ICD-10-CM

## 2020-05-05 DIAGNOSIS — Z23 Encounter for immunization: Secondary | ICD-10-CM | POA: Diagnosis not present

## 2020-05-05 DIAGNOSIS — T792XXD Traumatic secondary and recurrent hemorrhage and seroma, subsequent encounter: Secondary | ICD-10-CM

## 2020-05-05 DIAGNOSIS — F32A Depression, unspecified: Secondary | ICD-10-CM

## 2020-05-05 MED ORDER — VENLAFAXINE HCL ER 150 MG PO CP24: 150.0000 mg | ORAL_CAPSULE | Freq: Every day | ORAL | 3 refills | Status: DC

## 2020-05-05 NOTE — Patient Instructions (Addendum)
Life insurance  Health insurance I.e Lenore Cordia or see card  [facility]   Dr. Toy Care  [phone] Goal blood pressure <130/<80 take lisinopril-hct 2 pills in the am   05/29/2020 Ancillary Orders Lab Toni Arthurs, MD  [address]  [place], [place] [address]  [phone]  [phone] (Fax)    06/05/2020 Office Visit Endocrinology Toni Arthurs, [place],  [address]  [phone]  [phone] (Fax)     Mammogram due 07/27/20  Pap due 11/20/20

## 2020-05-05 NOTE — Progress Notes (Signed)
Chief Complaint  Patient presents with  . Follow-up  . Immunizations   F/u  1. Anxiety and depression on wellbutrin 150 mg xl and klonopin 1 mg bid prn and effexor 150 mg qd  2. Obesity with weight loss trying goal is the 170s and [facility] thinking about Wegovy vs saxenda  3. Uncontrolled hypothyroidism 02/2020 tsh > 6 s/p thyroid removal and hyperca f/u Dr. Honor Junes 05/2020 for w/u hypercalcemia and repeat labs  4. Left shoulder pain better with [facility] shot [facility] Sabra Heck and right traumatic seroma healing per pt no intervention per ortho  5. +covid still no taste and smell since dx'ed 03/10/20 6. HTN sl elevated on lis hctz 20-12.5 2 pills qhs and will change meds to the am and if still elevated will consider add CCB norvasc 2.5 mg qd   Review of Systems  Constitutional: Positive for weight loss.  HENT: Negative for hearing loss.        +loss of taste and smell  Eyes: Negative for blurred vision.  Respiratory: Negative for shortness of breath.   Cardiovascular: Negative for chest pain.  Gastrointestinal: Negative for abdominal pain.  Musculoskeletal: Negative for falls.  Skin: Negative for rash.  Neurological: Negative for headaches.  Psychiatric/Behavioral: Positive for depression. The patient is nervous/anxious.    Past Medical History:  Diagnosis Date  . Anxiety   . Arthritis   . Asthma   . Barrett's esophagus 2015  . COVID-19    03/10/20  . Depression   . GERD (gastroesophageal reflux disease)   . Hypertension   . Hypothyroidism   . Pneumonia   . Thyroid disease    Past Surgical History:  Procedure Laterality Date  . ABLATION  2006  . BREAST CYST ASPIRATION Left 1998  . BREAST SURGERY     cyst aspiration  . BREAST SURGERY  1998   cyst aspiration  . COLONOSCOPY  03/2014  . COLONOSCOPY WITH PROPOFOL N/A 01/06/2020   Procedure: COLONOSCOPY WITH PROPOFOL;  Surgeon: Toledo, Benay Pike, MD;  Location: [facility] ENDOSCOPY;  Service: Gastroenterology;   Laterality: N/A;  . ENDOMETRIAL ABLATION    . ESOPHAGOGASTRODUODENOSCOPY (EGD) WITH PROPOFOL N/A 08/15/2015   Procedure: ESOPHAGOGASTRODUODENOSCOPY (EGD) WITH PROPOFOL;  Surgeon: Lollie Sails, MD;  Location[facility] ENDOSCOPY;  Service: Endoscopy;  Laterality: N/A;  . ESOPHAGOGASTRODUODENOSCOPY (EGD) WITH PROPOFOL N/A 01/22/2016   Procedure: ESOPHAGOGASTRODUODENOSCOPY (EGD) WITH PROPOFOL;  Surgeon: Lollie Sails, MD;  Location: [facility] ENDOSCOPY;  Service: Endoscopy;  Laterality: N/A;  . ESOPHAGOGASTRODUODENOSCOPY (EGD) WITH PROPOFOL N/A 01/06/2020   Procedure: ESOPHAGOGASTRODUODENOSCOPY (EGD) WITH PROPOFOL;  Surgeon: Toledo, Benay Pike, MD;  Location: [facility] ENDOSCOPY;  Service: Gastroenterology;  Laterality: N/A;  . GASTRIC BYPASS  2005  . HERNIA REPAIR    . NASAL SINUS SURGERY    . 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 TOTAL THYROIDECTOMY    . TUBAL LIGATION     Family History  Problem Relation Age of Onset  . Heart disease Mother   . COPD Mother   . Arthritis Mother   . Cancer Mother        uterine  . Lupus Mother   . Anxiety disorder Mother   . Depression Mother   . Drug abuse Mother   . COPD Father   . Arthritis Father   . Heart disease Father   . Heart attack Father   . Alcohol abuse Father   . Heart disease Brother   . Heart attack Brother   .  Drug abuse Brother   . Anxiety disorder Brother   . Depression Brother   . Heart disease Brother   . Cancer Brother        liver cancer age 35 y.o  . Diabetes Maternal Grandmother   . Diabetes Maternal Grandfather   . Diabetes Paternal Grandmother   . Diabetes Paternal Grandfather    Social History   Socioeconomic History  . Marital status: Divorced    Spouse name: Not on file  . Number of children: 2  . Years of education: Not on file  . Highest education level: High school graduate  Occupational History  . Not on file  Tobacco Use  . Smoking status: Current Every Day Smoker    Packs/day: 1.00    Years: 30.00     Pack years: 30.00    Types: Cigarettes    Start date: 03/15/1980  . Smokeless tobacco: Never Used  Vaping Use  . Vaping Use: Never used  Substance and Sexual Activity  . Alcohol use: Yes    Alcohol/week: 10.0 - 12.0 standard drinks    Types: 8 - 10 Glasses of wine, 2 Standard drinks or equivalent per week  . Drug use: No  . Sexual activity: Yes    Partners: Male  Other Topics Concern  . Not on file  Social History Narrative   Lives in [place] single, divorced       Work - 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       Diet - healthy diet   Exercise - limited   Caffeine use: daily   Social Determinants of Health   Financial Resource Strain:   . Difficulty of Paying Living Expenses: Not on file  Food Insecurity:   . Worried About Charity fundraiser in the Last Year: Not on file  . Ran Out of Food in the Last Year: Not on file  Transportation Needs:   . Lack of Transportation (Medical): Not on file  . Lack of Transportation (Non-Medical): Not on file  Physical Activity:   . Days of Exercise per Week: Not on file  . Minutes of Exercise per Session: Not on file  Stress:   . Feeling of Stress : Not on file  Social Connections:   . Frequency of Communication with Friends and Family: Not on file  . Frequency of Social Gatherings with Friends and Family: Not on file  . Attends Religious Services: Not on file  . Active Member of Clubs or Organizations: Not on file  . Attends Archivist Meetings: Not on file  . Marital Status: Not on file  Intimate Partner Violence:   . Fear of Current or Ex-Partner: Not on file  . Emotionally Abused: Not on file  . Physically Abused: Not on file  . Sexually Abused: Not on file   Current Meds  Medication Sig  . albuterol (VENTOLIN HFA) 108 (90 Base) MCG/ACT inhaler Inhale 2 puffs into the lungs every 6 (six) hours as needed for wheezing or shortness of breath.  . budesonide-formoterol (SYMBICORT) 80-4.5 MCG/ACT inhaler Inhale 2  puffs into the lungs 2 (two) times daily. Rinse mouth  . buPROPion (WELLBUTRIN XL) 150 MG 24 hr tablet Take 1 tablet (150 mg total) by mouth daily.  . cetirizine (ZYRTEC) 10 MG tablet Take 1 tablet (10 mg total) by mouth daily.  . Cholecalciferol 1.25 MG (50000 UT) capsule Take 1 capsule (50,000 Units total) by mouth once a week.  . clonazePAM (KLONOPIN) 1 MG tablet  Take 1 tablet (1 mg total) by mouth 2 (two) times daily as needed for anxiety.  . cyanocobalamin (,VITAMIN B-12,) 1000 MCG/ML injection Inject 1 mL (1,000 mcg total) into the muscle every 30 (thirty) days.  . hydroxychloroquine (PLAQUENIL) 200 MG tablet Take 1 tablet (200 mg total) by mouth 2 (two) times daily.  Marland Kitchen lisinopril-hydrochlorothiazide (ZESTORETIC) 20-12.5 MG tablet Take 2 tablets by mouth daily.  . methocarbamol (ROBAXIN) 500 MG tablet Take 500 mg by mouth 2 (two) times daily.  . montelukast (SINGULAIR) 10 MG tablet Take 1 tablet (10 mg total) by mouth at bedtime.  Marland Kitchen NEEDLE, DISP, 25 G 25G X 1-1/2" MISC 1 Device by Does not apply route every 30 (thirty) days.  . pantoprazole (PROTONIX) 40 MG tablet Take 1 tablet (40 mg total) by mouth daily. In am 30 min before food  . SYNTHROID 200 MCG tablet TAKE 1 TABLET BY MOUTH ONCE daily before breakfast 30 minutes  Further refills from [facility] clinic endocrine Dr. Honor Junes, Marcello Moores no exceptions. Call to make appt endocrine  . venlafaxine XR (EFFEXOR-XR) 150 MG 24 hr capsule Take 1 capsule (150 mg total) by mouth daily with breakfast.  . [DISCONTINUED] venlafaxine XR (EFFEXOR-XR) 150 MG 24 hr capsule Take 1 capsule (150 mg total) by mouth daily with breakfast.   No Known Allergies No results found for this or any previous visit (from the past 2160 hour(s)). Objective  Body mass index is 35.98 kg/m. Wt Readings from Last 3 Encounters:  05/05/20 203 lb 1.9 oz (92.1 kg)  02/02/20 219 lb (99.3 kg)  01/06/20 (!) 222 lb 7.1 oz (100.9 kg)   Temp Readings from Last 3 Encounters:   05/05/20 (!) 97.5 F (36.4 C) (Oral)  02/02/20 98.1 F (36.7 C) (Oral)  01/06/20 (!) 96 F (35.6 C) (Temporal)   BP Readings from Last 3 Encounters:  05/05/20 136/70  02/02/20 118/78  01/06/20 104/65   Pulse Readings from Last 3 Encounters:  05/05/20 81  02/02/20 94  01/06/20 73    Physical Exam Vitals and nursing note reviewed.  Constitutional:      Appearance: Normal appearance. She is well-developed and well-groomed. She is obese.  HENT:     Head: Normocephalic and atraumatic.  Eyes:     Conjunctiva/sclera: Conjunctivae normal.     Pupils: Pupils are equal, round, and reactive to light.  Cardiovascular:     Rate and Rhythm: Normal rate and regular rhythm.     Heart sounds: Normal heart sounds.  Pulmonary:     Effort: Pulmonary effort is normal.     Breath sounds: Normal breath sounds.  Skin:    General: Skin is warm and moist.  Neurological:     Mental Status: She is alert and oriented to person, place, and time.     Gait: Gait normal.  Psychiatric:        Attention and Perception: Attention and perception normal.        Mood and Affect: Mood and affect normal.        Speech: Speech normal.        Behavior: Behavior normal. Behavior is cooperative.        Thought Content: Thought content normal.        Cognition and Memory: Cognition and memory normal.        Judgment: Judgment normal.     Assessment  Plan  Anxiety and depression - Plan: venlafaxine XR (EFFEXOR-XR) 150 MG 24 hr capsule, Prn klonopin 1 mg bid prn  Ambulatory referral to Psychiatry, Ambulatory referral to Psychology  Traumatic seroma of lower leg, right, subsequent encounter [facility] right leg Dr. Sabra Heck no intervention will resolve on its own  Hypertension, unspecified type rec take lis-hctz 20-12.5 2 pills in the am consider add norvasc 2.5 mg qd  Hypercalcemia  Pending w/u 05/2020 with [facility]  Obesity (BMI 30-39.9) S/p wt loss surgery  rec healthy diet and exercise    Acquired hypothyroidism  F/u [facility] 05/2020   HM Flu shotgiven today pna 23 had 02/02/20   covid shot had 2/2 pfizer and booster 04/13/20 had covid 03/10/20 +   Tdap UTD10/25/13 rec hepBvaccinefor future(consider new x 2 doses) Consider MMR in future  mammo2/10/21neg, ordered   Papneg 11/20/17 no HPV will due pap at f/u  EGD/colonoscopy had [facility] , need to get report colonoscopy 10/10/15KC Also EGD due 01/22/2019 barretts last 01/21/14 no dysplasia EGD/colonoscopy [facility] 01/06/20 Dr. Alice Reichert EGD/Colonoscopy barretts neg bx f/u in 3 years egd and 5 years colonoscopy   Skin8/18/21 no issues  rec smoking cesasationand exercise to lose with healthy diet choiceswaist 46.5 in today  F/u [facility] Dr. Eldridge Scot get on wegovy vs saxenda   Provider: Dr. Olivia Mackie McLean-Scocuzza-Internal Medicine

## 2020-05-10 ENCOUNTER — Encounter: Payer: Self-pay | Admitting: Internal Medicine

## 2020-05-15 ENCOUNTER — Other Ambulatory Visit: Payer: Self-pay | Admitting: Internal Medicine

## 2020-05-15 DIAGNOSIS — B379 Candidiasis, unspecified: Secondary | ICD-10-CM

## 2020-05-15 MED ORDER — FLUCONAZOLE 150 MG PO TABS: 150.0000 mg | ORAL_TABLET | Freq: Once | ORAL | 0 refills | Status: DC

## 2020-05-31 ENCOUNTER — Telehealth: Payer: Self-pay | Admitting: Internal Medicine

## 2020-05-31 NOTE — Telephone Encounter (Signed)
lft vm at [facility] to follow up on referral.

## 2020-06-20 ENCOUNTER — Ambulatory Visit
Admission: RE | Admit: 2020-06-20 | Discharge: 2020-06-20 | Disposition: A | Discharge status: Home / Self Care | Payer: Managed Care, Other (non HMO) | Source: Ambulatory Visit | Attending: Oncology | Admitting: Oncology

## 2020-06-20 ENCOUNTER — Other Ambulatory Visit: Payer: Self-pay

## 2020-06-20 ENCOUNTER — Inpatient Hospital Stay: Payer: Managed Care, Other (non HMO)

## 2020-06-20 ENCOUNTER — Encounter: Payer: Self-pay | Admitting: Oncology

## 2020-06-20 ENCOUNTER — Inpatient Hospital Stay: Payer: Managed Care, Other (non HMO) | Attending: Oncology | Admitting: Oncology

## 2020-06-20 VITALS — BP 138/80 | HR 66 | Temp 97.8°F | Resp 18 | Wt 193.8 lb

## 2020-06-20 DIAGNOSIS — Z8719 Personal history of other diseases of the digestive system: Secondary | ICD-10-CM | POA: Insufficient documentation

## 2020-06-20 DIAGNOSIS — E039 Hypothyroidism, unspecified: Secondary | ICD-10-CM | POA: Diagnosis not present

## 2020-06-20 DIAGNOSIS — E538 Deficiency of other specified B group vitamins: Secondary | ICD-10-CM | POA: Insufficient documentation

## 2020-06-20 DIAGNOSIS — F17210 Nicotine dependence, cigarettes, uncomplicated: Secondary | ICD-10-CM | POA: Insufficient documentation

## 2020-06-20 DIAGNOSIS — R778 Other specified abnormalities of plasma proteins: Secondary | ICD-10-CM

## 2020-06-20 DIAGNOSIS — E559 Vitamin D deficiency, unspecified: Secondary | ICD-10-CM | POA: Insufficient documentation

## 2020-06-20 DIAGNOSIS — R5383 Other fatigue: Secondary | ICD-10-CM | POA: Insufficient documentation

## 2020-06-20 LAB — COMPREHENSIVE METABOLIC PANEL
ALT: 27 U/L (ref 0–44)
AST: 26 U/L (ref 15–41)
Albumin: 3.3 g/dL — ABNORMAL LOW (ref 3.5–5.0)
Alkaline Phosphatase: 40 U/L (ref 38–126)
Anion gap: 11 (ref 5–15)
BUN: 13 mg/dL (ref 6–20)
CO2: 24 mmol/L (ref 22–32)
Calcium: 10 mg/dL (ref 8.9–10.3)
Chloride: 87 mmol/L — ABNORMAL LOW (ref 98–111)
Creatinine, Ser: 0.61 mg/dL (ref 0.44–1.00)
GFR, Estimated: >60 mL/min (ref >60)
Glucose, Bld: 101 mg/dL — ABNORMAL HIGH (ref 70–99)
Potassium: 4 mmol/L (ref 3.5–5.1)
Sodium: 122 mmol/L — ABNORMAL LOW (ref 135–145)
Total Bilirubin: 0.3 mg/dL (ref 0.3–1.2)
Total Protein: 8.5 g/dL — ABNORMAL HIGH (ref 6.5–8.1)

## 2020-06-20 LAB — CBC WITH DIFFERENTIAL/PLATELET
Abs Immature Granulocytes: 0.01 10*3/uL (ref 0.00–0.07)
Basophils Absolute: 0 10*3/uL (ref 0.0–0.1)
Basophils Relative: 0 %
Eosinophils Absolute: 0 10*3/uL (ref 0.0–0.5)
Eosinophils Relative: 0 %
HCT: 34 % — ABNORMAL LOW (ref 36.0–46.0)
Hemoglobin: 12.3 g/dL (ref 12.0–15.0)
Immature Granulocytes: 0 %
Lymphocytes Relative: 42 %
Lymphs Abs: 2.8 10*3/uL (ref 0.7–4.0)
MCH: 31.8 pg (ref 26.0–34.0)
MCHC: 36.2 g/dL — ABNORMAL HIGH (ref 30.0–36.0)
MCV: 87.9 fL (ref 80.0–100.0)
Monocytes Absolute: 0.6 10*3/uL (ref 0.1–1.0)
Monocytes Relative: 9 %
Neutro Abs: 3.2 10*3/uL (ref 1.7–7.7)
Neutrophils Relative %: 49 %
Platelets: 267 10*3/uL (ref 150–400)
RBC: 3.87 MIL/uL (ref 3.87–5.11)
RDW: 13.6 % (ref 11.5–15.5)
Smear Review: NORMAL
WBC: 6.7 10*3/uL (ref 4.0–10.5)
nRBC: 0 % (ref 0.0–0.2)

## 2020-06-20 LAB — LACTATE DEHYDROGENASE: LDH: 77 U/L — ABNORMAL LOW (ref 98–192)

## 2020-06-20 NOTE — Progress Notes (Signed)
Hematology/Oncology Consult note [facility] [phone] [phone]  Patient Care Team: McLean-Scocuzza, Nino Glow, MD as PCP - General (Internal Medicine) Bary Castilla, Forest Gleason, MD (General Surgery) Jackolyn Confer, MD (Internal Medicine)   Name of the patient: Paula Garrett  [id number]  03-10-1966    Reason for referral-abnormal SPEP   Referring physician- O'connell  Date of visit: 06/20/20   History of presenting illness- Patient is a 55 year old female with a past medical history significant for vitamin D deficiency B12 deficiency hypothyroidism and history of gastric bypass.  She was recently seen by Dr. Janit Pagan from endocrinology for her hypothyroidism.  She was found to have a mildly elevated calcium and therefore an SPEP was checked.  SPEP and urine showed IgG lambda light chains.  SPEP showed 2.2 g of M protein serum calcium was mildly elevated at 10.7.  Serum creatinine normal at 0.8.  She is actively trying to lose weight and is on [employer].  She is also taking B12 injections.  Reports mild fatigue but denies other complaints.  Denies any back pain but reports occasional bilateral leg pain  ECOG PS- 1  Pain scale- 0   Review of systems- Review of Systems  Constitutional: Positive for malaise/fatigue. Negative for chills, fever and weight loss.  HENT: Negative for congestion, ear discharge and nosebleeds.   Eyes: Negative for blurred vision.  Respiratory: Negative for cough, hemoptysis, sputum production, shortness of breath and wheezing.   Cardiovascular: Negative for chest pain, palpitations, orthopnea and claudication.  Gastrointestinal: Negative for abdominal pain, blood in stool, constipation, diarrhea, heartburn, melena, nausea and vomiting.  Genitourinary: Negative for dysuria, flank pain, frequency, hematuria and urgency.  Musculoskeletal: Negative for back pain, joint pain and myalgias.  Skin: Negative for rash.   Neurological: Negative for dizziness, tingling, focal weakness, seizures, weakness and headaches.  Endo/Heme/Allergies: Does not bruise/bleed easily.  Psychiatric/Behavioral: Negative for depression and suicidal ideas. The patient does not have insomnia.     No Known Allergies  Patient Active Problem List   Diagnosis Date Noted  . Depression, recurrent ([place]) 05/05/2020  . Hypercalcemia 05/05/2020  . Traumatic seroma of lower leg, right, subsequent encounter 05/05/2020  . Traumatic hematoma of lower leg, right, subsequent encounter 02/09/2020  . 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 Colon polyps 07/26/2019  . 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 Prediabetes 09/10/2018  . Morbid obesity with BMI of 40.0-44.9, adult ([place]) 05/12/2018  . OSA (obstructive sleep apnea) 01/09/2018  . Allergic rhinitis 11/20/2017  . Hyperlipidemia 11/20/2017  . Hot flashes 11/20/2017  . Asthma 07/26/2017  . B12 deficiency 07/26/2017  . Snoring 05/01/2016  . Rheumatoid factor positive 10/24/2015  . Annual physical exam 01/12/2015  . Chronic fatigue 06/23/2014  . 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 Barrett esophagus 05/27/2014  . Essential hypertension 01/20/2013  . Vitamin D deficiency 12/02/2012  . Chronic left shoulder pain 12/02/2012  . Tobacco abuse 06/09/2012  . Anxiety and depression 04/21/2012  . Insomnia 04/21/2012  . Hypothyroidism 04/21/2012  . Obesity (BMI 30-39.9) 04/21/2012     Past Medical History:  Diagnosis Date  . Anxiety   . Arthritis   . Asthma   . Barrett's esophagus 2015  . COVID-19    03/10/20  . Depression   . GERD (gastroesophageal reflux disease)   . Hypertension   . Hypothyroidism   . Pneumonia   . Thyroid disease      Past Surgical History:  Procedure Laterality Date  . ABLATION  2006  .  BREAST CYST ASPIRATION Left 1998  . BREAST SURGERY     cyst aspiration  . BREAST SURGERY  1998   cyst aspiration  . COLONOSCOPY  03/2014  . COLONOSCOPY WITH  PROPOFOL N/A 01/06/2020   Procedure: COLONOSCOPY WITH PROPOFOL;  Surgeon: Toledo, Benay Pike, MD;  Location: [facility] ENDOSCOPY;  Service: Gastroenterology;  Laterality: N/A;  . ENDOMETRIAL ABLATION    . ESOPHAGOGASTRODUODENOSCOPY (EGD) WITH PROPOFOL N/A 08/15/2015   Procedure: ESOPHAGOGASTRODUODENOSCOPY (EGD) WITH PROPOFOL;  Surgeon: Lollie Sails, MD;  Location: [facility] ENDOSCOPY;  Service: Endoscopy;  Laterality: N/A;  . ESOPHAGOGASTRODUODENOSCOPY (EGD) WITH PROPOFOL N/A 01/22/2016   Procedure: ESOPHAGOGASTRODUODENOSCOPY (EGD) WITH PROPOFOL;  Surgeon: Lollie Sails, MD;  Location: [facility] ENDOSCOPY;  Service: Endoscopy;  Laterality: N/A;  . ESOPHAGOGASTRODUODENOSCOPY (EGD) WITH PROPOFOL N/A 01/06/2020   Procedure: ESOPHAGOGASTRODUODENOSCOPY (EGD) WITH PROPOFOL;  Surgeon: Toledo, Benay Pike, MD;  Location: [facility] ENDOSCOPY;  Service: Gastroenterology;  Laterality: N/A;  . GASTRIC BYPASS  2005  . HERNIA REPAIR    . NASAL SINUS SURGERY    . 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 TOTAL THYROIDECTOMY    . TUBAL LIGATION      Social History   Socioeconomic History  . Marital status: Divorced    Spouse name: Not on file  . Number of children: 2  . Years of education: Not on file  . Highest education level: High school graduate  Occupational History  . Not on file  Tobacco Use  . Smoking status: Current Every Day Smoker    Packs/day: 1.00    Years: 30.00    Pack years: 30.00    Types: Cigarettes    Start date: 03/15/1980  . Smokeless tobacco: Never Used  Vaping Use  . Vaping Use: Never used  Substance and Sexual Activity  . Alcohol use: Yes    Alcohol/week: 10.0 - 12.0 standard drinks    Types: 8 - 10 Glasses of wine, 2 Standard drinks or equivalent per week  . Drug use: No  . Sexual activity: Yes    Partners: Male  Other Topics Concern  . Not on file  Social History Narrative   Lives in [place] single, divorced       Work - 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       Diet -  healthy diet   Exercise - limited   Caffeine use: daily   Social Determinants of Health   Financial Resource Strain: Not on file  Food Insecurity: Not on file  Transportation Needs: Not on file  Physical Activity: Not on file  Stress: Not on file  Social Connections: Not on file  Intimate Partner Violence: Not on file     Family History  Problem Relation Age of Onset  . Heart disease Mother   . COPD Mother   . Arthritis Mother   . Cancer Mother        uterine  . Lupus Mother   . Anxiety disorder Mother   . Depression Mother   . Drug abuse Mother   . COPD Father   . Arthritis Father   . Heart disease Father   . Heart attack Father   . Alcohol abuse Father   . Heart disease Brother   . Heart attack Brother   . Drug abuse Brother   . Anxiety disorder Brother   . Depression Brother   . Heart disease Brother   . Cancer Brother        liver cancer  age 84 y.o  . Diabetes Maternal Grandmother   . Diabetes Maternal Grandfather   . Diabetes Paternal Grandmother   . Diabetes Paternal Grandfather      Current Outpatient Medications:  .  albuterol (VENTOLIN HFA) 108 (90 Base) MCG/ACT inhaler, Inhale 2 puffs into the lungs every 6 (six) hours as needed for wheezing or shortness of breath., Disp: 18 g, Rfl: 11 .  budesonide-formoterol (SYMBICORT) 80-4.5 MCG/ACT inhaler, Inhale 2 puffs into the lungs 2 (two) times daily. Rinse mouth, Disp: 1 Inhaler, Rfl: 11 .  buPROPion (WELLBUTRIN XL) 150 MG 24 hr tablet, Take 1 tablet (150 mg total) by mouth daily., Disp: 90 tablet, Rfl: 3 .  cetirizine (ZYRTEC) 10 MG tablet, Take 1 tablet (10 mg total) by mouth daily., Disp: 90 tablet, Rfl: 3 .  Cholecalciferol 1.25 MG (50000 UT) capsule, Take 1 capsule (50,000 Units total) by mouth once a week., Disp: 13 capsule, Rfl: 3 .  clonazePAM (KLONOPIN) 1 MG tablet, Take 1 tablet (1 mg total) by mouth 2 (two) times daily as needed for anxiety., Disp: 60 tablet, Rfl: 5 .  cyanocobalamin (,VITAMIN  B-12,) 1000 MCG/ML injection, Inject 1 mL (1,000 mcg total) into the muscle every 30 (thirty) days., Disp: 4 mL, Rfl: 12 .  hydroxychloroquine (PLAQUENIL) 200 MG tablet, Take 1 tablet (200 mg total) by mouth 2 (two) times daily., Disp: , Rfl:  .  lisinopril-hydrochlorothiazide (ZESTORETIC) 20-12.5 MG tablet, Take 2 tablets by mouth daily., Disp: 180 tablet, Rfl: 3 .  methocarbamol (ROBAXIN) 500 MG tablet, Take 500 mg by mouth 2 (two) times daily., Disp: , Rfl:  .  montelukast (SINGULAIR) 10 MG tablet, Take 1 tablet (10 mg total) by mouth at bedtime., Disp: 90 tablet, Rfl: 3 .  NEEDLE, DISP, 25 G 25G X 1-1/2" MISC, 1 Device by Does not apply route every 30 (thirty) days., Disp: 30 each, Rfl: 2 .  pantoprazole (PROTONIX) 40 MG tablet, Take 1 tablet (40 mg total) by mouth daily. In am 30 min before food, Disp: 90 tablet, Rfl: 3 .  SYNTHROID 200 MCG tablet, TAKE 1 TABLET BY MOUTH ONCE daily before breakfast 30 minutes  Further refills from [facility] clinic endocrine Dr. Honor Junes, Marcello Moores no exceptions. Call to make appt endocrine, Disp: 90 tablet, Rfl: 3 .  venlafaxine XR (EFFEXOR-XR) 150 MG 24 hr capsule, Take 1 capsule (150 mg total) by mouth daily with breakfast., Disp: 90 capsule, Rfl: 3   Physical exam: There were no vitals filed for this visit. Physical Exam Constitutional:      General: She is not in acute distress. Eyes:     Extraocular Movements: EOM normal.  Cardiovascular:     Rate and Rhythm: Normal rate and regular rhythm.     Heart sounds: Normal heart sounds.  Pulmonary:     Effort: Pulmonary effort is normal.     Breath sounds: Normal breath sounds.  Abdominal:     General: Bowel sounds are normal.     Palpations: Abdomen is soft.  Skin:    General: Skin is warm and dry.  Neurological:     Mental Status: She is alert and oriented to person, place, and time.        CMP Latest Ref Rng & Units 07/14/2019  Glucose 70 - 99 mg/dL 189(H)  BUN 6 - 23 mg/dL 12  Creatinine 0.40  - 1.20 mg/dL 0.81  Sodium 135 - 145 mEq/L 138  Potassium 3.5 - 5.1 mEq/L 3.8  Chloride 96 - 112 mEq/L  100  CO2 19 - 32 mEq/L 26  Calcium 8.4 - 10.5 mg/dL 10.2  Total Protein 6.0 - 8.3 g/dL 8.0  Total Bilirubin 0.2 - 1.2 mg/dL 0.3  Alkaline Phos 39 - 117 U/L 51  AST 0 - 37 U/L 13  ALT 0 - 35 U/L 16   CBC Latest Ref Rng & Units 07/14/2019  WBC 4.0 - 10.5 K/uL 8.3  Hemoglobin 12.0 - 15.0 g/dL 12.7  Hematocrit 36.0 - 46.0 % 38.5  Platelets 150.0 - 400.0 K/uL 296.0    No images are attached to the encounter.  No results found.  Assessment and plan- Patient is a 55 y.o. 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  Discussed differences between MGUS smoldering myeloma and overt multiple myeloma.  Patient does have a mildly elevated serum calcium of 10.7.  Total protein is at the upper limit of normal at 8.2.  Multiple myeloma remains in the differential.  Today I will check a CBC with differential, CMP, beta-2 microglobulin, LDH, myeloma panel, serum free light chains and 24-hour urine protein electrophoresis.  We will also order a bone survey.  I will see her back in 2 weeks time to discuss the results of her blood work and further management.  Depending on the results of her blood work from today she may also need a bone marrow biopsy to complete her work-up   Thank you for this kind referral and the opportunity to participate in the care of this patient   Visit Diagnosis 1. Abnormal SPEP     Dr. Randa Evens, MD, MPH [facility] at [facility] [phone] 06/20/2020 4:56 PM

## 2020-06-21 ENCOUNTER — Other Ambulatory Visit: Payer: Self-pay | Admitting: *Deleted

## 2020-06-21 DIAGNOSIS — R778 Other specified abnormalities of plasma proteins: Secondary | ICD-10-CM

## 2020-06-21 DIAGNOSIS — R768 Other specified abnormal immunological findings in serum: Secondary | ICD-10-CM

## 2020-06-21 LAB — KAPPA/LAMBDA LIGHT CHAINS
Kappa free light chain: 12.3 mg/L (ref 3.3–19.4)
Kappa, lambda light chain ratio: 0.04 — ABNORMAL LOW (ref 0.26–1.65)
Lambda free light chains: 303.8 mg/L — ABNORMAL HIGH (ref 5.7–26.3)

## 2020-06-21 LAB — BETA 2 MICROGLOBULIN, SERUM: Beta-2 Microglobulin: 1.7 mg/L (ref 0.6–2.4)

## 2020-06-21 NOTE — Progress Notes (Signed)
Called pt to let her know that her sodium level was low and if she can really hydrate herself with gatorade or powerade about 4 a day and then of course drink water 4 bottles to see if the sodium would come up. She does want to have her repeat the sodium level and I will check with Smith Robert as to when to recheck and call pt and she is agreeable to the plan

## 2020-06-22 LAB — MULTIPLE MYELOMA PANEL, SERUM
Albumin SerPl Elph-Mcnc: 3.7 g/dL (ref 2.9–4.4)
Albumin/Glob SerPl: 0.8 (ref 0.7–1.7)
Alpha 1: 0.2 g/dL (ref 0.0–0.4)
Alpha2 Glob SerPl Elph-Mcnc: 0.7 g/dL (ref 0.4–1.0)
B-Globulin SerPl Elph-Mcnc: 3.6 g/dL — ABNORMAL HIGH (ref 0.7–1.3)
Gamma Glob SerPl Elph-Mcnc: 0.2 g/dL — ABNORMAL LOW (ref 0.4–1.8)
Globulin, Total: 4.7 g/dL — ABNORMAL HIGH (ref 2.2–3.9)
IgA: 25 mg/dL — ABNORMAL LOW (ref 87–352)
IgG (Immunoglobin G), Serum: 3668 mg/dL — ABNORMAL HIGH (ref 586–1602)
IgM (Immunoglobulin M), Srm: 49 mg/dL (ref 26–217)
M Protein SerPl Elph-Mcnc: 3.1 g/dL — ABNORMAL HIGH
Total Protein ELP: 8.4 g/dL (ref 6.0–8.5)

## 2020-06-23 NOTE — Progress Notes (Signed)
Patient on schedule for BMB 06/28/2020, called and spoke with patient on phone with pre procedure instructions given. Made aware to be here @ 0730, NPO after MN prior to procedure, and driver for discharge post procedure/recovery. Stated understanding.

## 2020-06-27 ENCOUNTER — Other Ambulatory Visit: Payer: Self-pay | Admitting: Student

## 2020-06-28 ENCOUNTER — Other Ambulatory Visit: Payer: Self-pay

## 2020-06-28 ENCOUNTER — Ambulatory Visit
Admission: RE | Admit: 2020-06-28 | Discharge: 2020-06-28 | Disposition: A | Discharge status: Home / Self Care | Payer: Managed Care, Other (non HMO) | Source: Ambulatory Visit | Attending: Oncology | Admitting: Oncology

## 2020-06-28 DIAGNOSIS — C9000 Multiple myeloma not having achieved remission: Secondary | ICD-10-CM | POA: Insufficient documentation

## 2020-06-28 DIAGNOSIS — R778 Other specified abnormalities of plasma proteins: Secondary | ICD-10-CM | POA: Diagnosis not present

## 2020-06-28 DIAGNOSIS — R768 Other specified abnormal immunological findings in serum: Secondary | ICD-10-CM | POA: Insufficient documentation

## 2020-06-28 DIAGNOSIS — F17210 Nicotine dependence, cigarettes, uncomplicated: Secondary | ICD-10-CM | POA: Insufficient documentation

## 2020-06-28 DIAGNOSIS — Z79899 Other long term (current) drug therapy: Secondary | ICD-10-CM | POA: Diagnosis not present

## 2020-06-28 DIAGNOSIS — E039 Hypothyroidism, unspecified: Secondary | ICD-10-CM | POA: Diagnosis not present

## 2020-06-28 LAB — CBC WITH DIFFERENTIAL/PLATELET
Abs Immature Granulocytes: 0.01 10*3/uL (ref 0.00–0.07)
Basophils Absolute: 0 10*3/uL (ref 0.0–0.1)
Basophils Relative: 0 %
Eosinophils Absolute: 0 10*3/uL (ref 0.0–0.5)
Eosinophils Relative: 1 %
HCT: 37.9 % (ref 36.0–46.0)
Hemoglobin: 13.2 g/dL (ref 12.0–15.0)
Immature Granulocytes: 0 %
Lymphocytes Relative: 33 %
Lymphs Abs: 1.8 10*3/uL (ref 0.7–4.0)
MCH: 30.8 pg (ref 26.0–34.0)
MCHC: 34.8 g/dL (ref 30.0–36.0)
MCV: 88.3 fL (ref 80.0–100.0)
Monocytes Absolute: 0.7 10*3/uL (ref 0.1–1.0)
Monocytes Relative: 13 %
Neutro Abs: 2.9 10*3/uL (ref 1.7–7.7)
Neutrophils Relative %: 53 %
Platelets: 277 10*3/uL (ref 150–400)
RBC: 4.29 MIL/uL (ref 3.87–5.11)
RDW: 13.3 % (ref 11.5–15.5)
WBC: 5.5 10*3/uL (ref 4.0–10.5)
nRBC: 0 % (ref 0.0–0.2)

## 2020-06-28 LAB — PROTIME-INR
INR: 1 (ref 0.8–1.2)
Prothrombin Time: 12.3 seconds (ref 11.4–15.2)

## 2020-06-28 MED ORDER — MIDAZOLAM HCL 2 MG/2ML IJ SOLN
INTRAMUSCULAR | Status: AC | PRN
Start: 2020-06-28 — End: 2020-06-28
  Administered 2020-06-28 (×2): 1 mg via INTRAVENOUS

## 2020-06-28 MED ORDER — FENTANYL CITRATE (PF) 100 MCG/2ML IJ SOLN
INTRAMUSCULAR | Status: AC
  Filled 2020-06-28: qty 2

## 2020-06-28 MED ORDER — SODIUM CHLORIDE 0.9 % IV SOLN: INTRAVENOUS | Status: DC

## 2020-06-28 MED ORDER — FENTANYL CITRATE (PF) 100 MCG/2ML IJ SOLN
INTRAMUSCULAR | Status: AC | PRN
Start: 2020-06-28 — End: 2020-06-28
  Administered 2020-06-28 (×2): 50 ug via INTRAVENOUS

## 2020-06-28 MED ORDER — MIDAZOLAM HCL 2 MG/2ML IJ SOLN
INTRAMUSCULAR | Status: AC
  Filled 2020-06-28: qty 2

## 2020-06-28 MED ORDER — HEPARIN SOD (PORK) LOCK FLUSH 100 UNIT/ML IV SOLN
INTRAVENOUS | Status: AC
  Filled 2020-06-28: qty 5

## 2020-06-28 NOTE — H&P (Signed)
Chief Complaint: Patient was seen in consultation today for  at the request of Rao,Archana C  Referring Physician(s): Rao,Archana C  Patient Status: [facility] - Out-pt  History of Present Illness: Paula Garrett is a 55 y.o. female with hypothyroidism and found to have elevated calcium and abnormal SPEP.  Patient has been referred for a CT-guided bone marrow biopsy.  Patient has no complaints today.  She normally uses an inhaler but has not used one in a few days.  She denies fevers or chills.  Patient has been vaccinated for COVID-19.  Past Medical History:  Diagnosis Date  . Anxiety   . Arthritis   . Asthma   . Barrett's esophagus 2015  . COVID-19    03/10/20  . Depression   . GERD (gastroesophageal reflux disease)   . Hypertension   . Hypothyroidism   . Pneumonia   . Thyroid disease     Past Surgical History:  Procedure Laterality Date  . ABLATION  2006  . BREAST CYST ASPIRATION Left 1998  . BREAST SURGERY     cyst aspiration  . BREAST SURGERY  1998   cyst aspiration  . COLONOSCOPY  03/2014  . COLONOSCOPY WITH PROPOFOL N/A 01/06/2020   Procedure: COLONOSCOPY WITH PROPOFOL;  Surgeon: Toledo, Benay Pike, MD;  Location: [facility] ENDOSCOPY;  Service: Gastroenterology;  Laterality: N/A;  . ENDOMETRIAL ABLATION    . ESOPHAGOGASTRODUODENOSCOPY (EGD) WITH PROPOFOL N/A 08/15/2015   Procedure: ESOPHAGOGASTRODUODENOSCOPY (EGD) WITH PROPOFOL;  Surgeon: Lollie Sails, MD;  Location: [facility] ENDOSCOPY;  Service: Endoscopy;  Laterality: N/A;  . ESOPHAGOGASTRODUODENOSCOPY (EGD) WITH PROPOFOL N/A 01/22/2016   Procedure: ESOPHAGOGASTRODUODENOSCOPY (EGD) WITH PROPOFOL;  Surgeon: Lollie Sails, MD;  Location: [facility] ENDOSCOPY;  Service: Endoscopy;  Laterality: N/A;  . ESOPHAGOGASTRODUODENOSCOPY (EGD) WITH PROPOFOL N/A 01/06/2020   Procedure: ESOPHAGOGASTRODUODENOSCOPY (EGD) WITH PROPOFOL;  Surgeon: Toledo, Benay Pike, MD;  Location: [facility] ENDOSCOPY;  Service: Gastroenterology;  Laterality: N/A;   . GASTRIC BYPASS  2005  . HERNIA REPAIR    . NASAL SINUS SURGERY    . 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 TOTAL THYROIDECTOMY    . TUBAL LIGATION      Allergies: Patient has no known allergies.  Medications: Prior to Admission medications   Medication Sig Start Date End Date Taking? Authorizing Provider  albuterol (VENTOLIN HFA) 108 (90 Base) MCG/ACT inhaler Inhale 2 puffs into the lungs every 6 (six) hours as needed for wheezing or shortness of breath. 10/07/19  Yes McLean-Scocuzza, Nino Glow, MD  buPROPion (WELLBUTRIN XL) 150 MG 24 hr tablet Take 1 tablet (150 mg total) by mouth daily. 10/07/19  Yes McLean-Scocuzza, Nino Glow, MD  clonazePAM (KLONOPIN) 1 MG tablet Take 1 tablet (1 mg total) by mouth 2 (two) times daily as needed for anxiety. 02/03/20  Yes McLean-Scocuzza, Nino Glow, MD  cyanocobalamin (,VITAMIN B-12,) 1000 MCG/ML injection Inject 1 mL (1,000 mcg total) into the muscle every 30 (thirty) days. 02/02/20  Yes McLean-Scocuzza, Nino Glow, MD  hydroxychloroquine (PLAQUENIL) 200 MG tablet Take 1 tablet (200 mg total) by mouth 2 (two) times daily. 10/27/19  Yes McLean-Scocuzza, Nino Glow, MD  lisinopril-hydrochlorothiazide (ZESTORETIC) 20-12.5 MG tablet Take 2 tablets by mouth daily. 10/07/19  Yes McLean-Scocuzza, Nino Glow, MD  methocarbamol (ROBAXIN) 500 MG tablet Take 500 mg by mouth 2 (two) times daily. 05/01/20  Yes [provider]  montelukast (SINGULAIR) 10 MG tablet Take 1 tablet (10 mg total) by mouth at bedtime. 10/07/19  Yes McLean-Scocuzza,  Nino Glow, MD  NEEDLE, DISP, 25 G 25G X 1-1/2" MISC 1 Device by Does not apply route every 30 (thirty) days. 02/02/20  Yes McLean-Scocuzza, Nino Glow, MD  pantoprazole (PROTONIX) 40 MG tablet Take 1 tablet (40 mg total) by mouth daily. In am 30 min before food 09/30/19  Yes McLean-Scocuzza, Nino Glow, MD  SYNTHROID 200 MCG tablet TAKE 1 TABLET BY MOUTH ONCE daily before breakfast 30 minutes  Further refills from [facility] endocrine Dr.  Honor Junes, Marcello Moores no exceptions. Call to make appt endocrine 09/10/18  Yes McLean-Scocuzza, Nino Glow, MD  venlafaxine XR (EFFEXOR-XR) 150 MG 24 hr capsule Take 1 capsule (150 mg total) by mouth daily with breakfast. 05/05/20  Yes McLean-Scocuzza, Nino Glow, MD  budesonide-formoterol (SYMBICORT) 80-4.5 MCG/ACT inhaler Inhale 2 puffs into the lungs 2 (two) times daily. Rinse mouth Patient not taking: Reported on 06/28/2020 10/07/19   McLean-Scocuzza, Nino Glow, MD  cetirizine (ZYRTEC) 10 MG tablet Take 1 tablet (10 mg total) by mouth daily. Patient not taking: Reported on 06/28/2020 10/07/19   McLean-Scocuzza, Nino Glow, MD  Cholecalciferol 1.25 MG (50000 UT) capsule Take 1 capsule (50,000 Units total) by mouth once a week. Patient not taking: Reported on 06/28/2020 02/02/20   McLean-Scocuzza, Nino Glow, MD     Family History  Problem Relation Age of Onset  . Heart disease Mother   . COPD Mother   . Arthritis Mother   . Cancer Mother        uterine  . Lupus Mother   . Anxiety disorder Mother   . Depression Mother   . Drug abuse Mother   . COPD Father   . Arthritis Father   . Heart disease Father   . Heart attack Father   . Alcohol abuse Father   . Heart disease Brother   . Heart attack Brother   . Drug abuse Brother   . Anxiety disorder Brother   . Depression Brother   . Heart disease Brother   . Cancer Brother        liver cancer age 72 y.o  . Diabetes Maternal Grandmother   . Diabetes Maternal Grandfather   . Diabetes Paternal Grandmother   . Diabetes Paternal Grandfather     Social History   Socioeconomic History  . Marital status: Divorced    Spouse name: Not on file  . Number of children: 2  . Years of education: Not on file  . Highest education level: High school graduate  Occupational History  . Not on file  Tobacco Use  . Smoking status: Current Every Day Smoker    Packs/day: 1.00    Years: 30.00    Pack years: 30.00    Types: Cigarettes    Start date: 03/15/1980  .  Smokeless tobacco: Never Used  Vaping Use  . Vaping Use: Never used  Substance and Sexual Activity  . Alcohol use: Yes    Alcohol/week: 10.0 - 12.0 standard drinks    Types: 8 - 10 Glasses of wine, 2 Standard drinks or equivalent per week  . Drug use: No  . Sexual activity: Yes    Partners: Male  Other Topics Concern  . Not on file  Social History Narrative   Lives in [place] single, divorced       Work - 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       Diet - healthy diet   Exercise - limited   Caffeine use: daily   Social Determinants of Health  Financial Resource Strain: Not on file  Food Insecurity: Not on file  Transportation Needs: Not on file  Physical Activity: Not on file  Stress: Not on file  Social Connections: Not on file    ECOG Status: 0 - Asymptomatic   Review of Systems  Constitutional: Negative for chills and fatigue.  Respiratory: Positive for wheezing.   Cardiovascular: Negative.   Gastrointestinal: Negative.   Genitourinary: Negative.   Neurological: Negative.     Vital Signs: BP 118/66   Pulse 70   Temp 97.7 F (36.5 C) (Oral)   Resp 15   Ht _0  (1.6 m)   SpO2 96%   BMI 34.33 kg/m   Physical Exam Constitutional:      Appearance: Normal appearance. She is not ill-appearing.  Cardiovascular:     Rate and Rhythm: Normal rate and regular rhythm.     Heart sounds: Normal heart sounds.  Pulmonary:     Effort: Pulmonary effort is normal.     Breath sounds: Wheezing present.  Abdominal:     General: Abdomen is flat.  Neurological:     General: No focal deficit present.     Mental Status: She is alert and oriented to person, place, and time.     Imaging: DG Bone Survey Met  Result Date: 06/20/2020 CLINICAL DATA:  Abnormal serum protein electrophoresis, MGUS EXAM: METASTATIC BONE SURVEY COMPARISON:  None. FINDINGS: No focal lytic lesion identified. Mild degenerative changes noted within the lumbar spine. Mild atherosclerotic  calcification noted within the abdominal aorta. Surgical staple line noted within the left mid abdomen. Lungs are clear. Cardiac size within normal limits. Small hiatal hernia. IMPRESSION: No lytic lesions identified within the visualized axial and appendicular skeleton. Electronically Signed   By: Fidela Salisbury MD   On: 06/20/2020 22:50    Labs:  CBC: Recent Labs    07/14/19 1459 06/20/20 1549 06/28/20 0747  WBC 8.3 6.7 5.5  HGB 12.7 12.3 13.2  HCT 38.5 34.0* 37.9  PLT 296.0 267 277    COAGS: Recent Labs    06/28/20 0747  INR 1.0    BMP: Recent Labs    07/14/19 1459 06/20/20 1549  NA 138 122*  K 3.8 4.0  CL 100 87*  CO2 26 24  GLUCOSE 189* 101*  BUN 12 13  CALCIUM 10.2 10.0  CREATININE 0.81 0.61  GFRNONAA  --  >60    LIVER FUNCTION TESTS: Recent Labs    07/14/19 1459 06/20/20 1549  BILITOT 0.3 0.3  AST 13 26  ALT 16 27  ALKPHOS 51 40  PROT 8.0 8.5*  ALBUMIN 3.5 3.3*    TUMOR MARKERS: No results for input(s): AFPTM, CEA, CA199, CHROMGRNA in the last 8760 hours.  Assessment and Plan:  55 year old with mildly elevated calcium and abnormal SPEP.  Patient has been referred for a bone marrow biopsy.  CT-guided bone marrow biopsy was discussed with the patient.  No contraindication for a CT-guided bone marrow biopsy and moderate sedation.  Risks and benefits of bone marrow biopsy was discussed with the patient and/or patient's family including, but not limited to bleeding, infection, damage to adjacent structures or low yield requiring additional tests. All of the questions were answered and there is agreement to proceed. Consent signed and in chart.    Thank you for this interesting consult.  I greatly enjoyed meeting ANAYANSI RUNDQUIST and look forward to participating in their care.  A copy of this report was sent to the requesting provider  on this date.  Electronically Signed: Burman Riis, MD 06/28/2020, 8:32 AM   I spent a total of  15 minutes   in  face to face in clinical consultation, greater than 50% of which was counseling/coordinating care for bone marrow biopsy.

## 2020-06-28 NOTE — Procedures (Signed)
Interventional Radiology Procedure:   Indications: Abnormal SPEP  Procedure: CT guided bone marrow biopsy  Findings: 2 aspirates and 1 core from right ilium  Complications: None     EBL: Minimal, less than 10 ml  Plan: Discharge to home in one hour.   Lemond Griffee R. Anselm Pancoast, MD  Pager: [phone]

## 2020-07-05 ENCOUNTER — Other Ambulatory Visit: Payer: Self-pay | Admitting: Oncology

## 2020-07-05 ENCOUNTER — Other Ambulatory Visit: Payer: Self-pay | Admitting: *Deleted

## 2020-07-05 ENCOUNTER — Encounter (HOSPITAL_COMMUNITY): Payer: Self-pay | Admitting: Oncology

## 2020-07-05 DIAGNOSIS — R778 Other specified abnormalities of plasma proteins: Secondary | ICD-10-CM

## 2020-07-05 DIAGNOSIS — R768 Other specified abnormal immunological findings in serum: Secondary | ICD-10-CM

## 2020-07-05 NOTE — Progress Notes (Signed)
Whole

## 2020-07-06 ENCOUNTER — Ambulatory Visit: Payer: Managed Care, Other (non HMO) | Admitting: Oncology

## 2020-07-06 ENCOUNTER — Ambulatory Visit (HOSPITAL_COMMUNITY): Payer: Managed Care, Other (non HMO)

## 2020-07-06 ENCOUNTER — Encounter (HOSPITAL_COMMUNITY): Payer: Self-pay

## 2020-07-07 ENCOUNTER — Encounter (HOSPITAL_COMMUNITY): Payer: Self-pay | Admitting: Oncology

## 2020-07-10 ENCOUNTER — Ambulatory Visit: Payer: Managed Care, Other (non HMO) | Admitting: Oncology

## 2020-07-13 ENCOUNTER — Other Ambulatory Visit: Payer: Self-pay | Admitting: *Deleted

## 2020-07-13 DIAGNOSIS — R768 Other specified abnormal immunological findings in serum: Secondary | ICD-10-CM

## 2020-07-13 DIAGNOSIS — R778 Other specified abnormalities of plasma proteins: Secondary | ICD-10-CM

## 2020-07-14 LAB — SURGICAL PATHOLOGY

## 2020-07-17 ENCOUNTER — Inpatient Hospital Stay: Payer: Managed Care, Other (non HMO) | Admitting: Oncology

## 2020-07-20 ENCOUNTER — Ambulatory Visit
Admission: RE | Admit: 2020-07-20 | Discharge: 2020-07-20 | Disposition: A | Discharge status: Home / Self Care | Payer: Managed Care, Other (non HMO) | Source: Ambulatory Visit | Attending: Oncology | Admitting: Oncology

## 2020-07-20 ENCOUNTER — Other Ambulatory Visit: Payer: Self-pay

## 2020-07-20 DIAGNOSIS — R768 Other specified abnormal immunological findings in serum: Secondary | ICD-10-CM

## 2020-07-20 DIAGNOSIS — R778 Other specified abnormalities of plasma proteins: Secondary | ICD-10-CM

## 2020-07-20 MED ORDER — GADOBUTROL 1 MMOL/ML IV SOLN
7.5000 mL | Freq: Once | INTRAVENOUS | Status: AC | PRN
  Administered 2020-07-20: 7.5 mL via INTRAVENOUS

## 2020-07-21 ENCOUNTER — Inpatient Hospital Stay: Payer: Managed Care, Other (non HMO) | Attending: Oncology | Admitting: Oncology

## 2020-07-21 DIAGNOSIS — E039 Hypothyroidism, unspecified: Secondary | ICD-10-CM | POA: Diagnosis not present

## 2020-07-21 DIAGNOSIS — Z9884 Bariatric surgery status: Secondary | ICD-10-CM | POA: Insufficient documentation

## 2020-07-21 DIAGNOSIS — Z8719 Personal history of other diseases of the digestive system: Secondary | ICD-10-CM | POA: Diagnosis not present

## 2020-07-21 DIAGNOSIS — E559 Vitamin D deficiency, unspecified: Secondary | ICD-10-CM | POA: Insufficient documentation

## 2020-07-21 DIAGNOSIS — E538 Deficiency of other specified B group vitamins: Secondary | ICD-10-CM | POA: Diagnosis not present

## 2020-07-21 DIAGNOSIS — C9000 Multiple myeloma not having achieved remission: Secondary | ICD-10-CM | POA: Diagnosis not present

## 2020-07-21 DIAGNOSIS — D472 Monoclonal gammopathy: Secondary | ICD-10-CM

## 2020-07-21 DIAGNOSIS — F17210 Nicotine dependence, cigarettes, uncomplicated: Secondary | ICD-10-CM | POA: Diagnosis not present

## 2020-07-21 DIAGNOSIS — Z7189 Other specified counseling: Secondary | ICD-10-CM | POA: Diagnosis not present

## 2020-07-24 ENCOUNTER — Encounter: Payer: Self-pay | Admitting: Oncology

## 2020-07-24 DIAGNOSIS — Z7189 Other specified counseling: Secondary | ICD-10-CM | POA: Insufficient documentation

## 2020-07-24 DIAGNOSIS — C9000 Multiple myeloma not having achieved remission: Secondary | ICD-10-CM | POA: Insufficient documentation

## 2020-07-24 DIAGNOSIS — D472 Monoclonal gammopathy: Secondary | ICD-10-CM | POA: Insufficient documentation

## 2020-07-24 NOTE — Progress Notes (Signed)
Hematology/Oncology Consult note [facility]  [phone] [phone]  Patient Care Team: McLean-Scocuzza, Nino Glow, MD as PCP - General (Internal Medicine) Bary Castilla, Forest Gleason, MD (General Surgery) Jackolyn Confer, MD (Internal Medicine)   Name of the patient: Paula Garrett  [id number]  1966-03-26   Date of visit: 07/24/20  Diagnosis-smoldering multiple myeloma  Chief complaint/ Reason for visit-discussed results of blood work and MRI  Heme/Onc history: Patient is a 55 year old female with a past medical history significant for vitamin D deficiency B12 deficiency hypothyroidism and history of gastric bypass.  She was recently seen by Dr. Janit Pagan from endocrinology for her hypothyroidism.  She was found to have a mildly elevated calcium and therefore an SPEP was checked.  SPEP and urine showed IgG lambda light chains.  SPEP showed 2.2 g of M protein serum calcium was mildly elevated at 10.7.  Serum creatinine normal at 0.8.  She is actively trying to lose weight and is on [employer].  She is also taking B12 injections.  Reports mild fatigue but denies other complaints.  Denies any back pain but reports occasional bilateral leg pain  Results of myeloma work-up from 06/20/2020 showed an elevated IgG of 3668.  M protein was elevated at 3.1 and IFE showed IgG monoclonal lambda protein.  Beta-2 microglobulin and LDH were normal.  Serum free light chain ratio was elevated at 25 with a free light chain lambda of 303.  Bone marrow biopsy showed increased plasma cells 22% by manual count and 20% by IHC staining for CD138.  Cytogenetics for myeloma showed gain of 1 q.  PET CT scan was not approved by insurance.  MRI cervical and lumbar thoracic spine did not show any evidence of lytic lesions.  Bone survey was negative for bone lesions as well.  MRI pelvis with and without contrast showed diffuse patchy heterogeneous marrow concerning for  myeloma.   Interval history-patient is doing well overall and other than mild fatigue she denies other complaints at this time  ECOG PS- 1 Pain scale- 0   Review of systems- Review of Systems  Constitutional: Positive for malaise/fatigue. Negative for chills, fever and weight loss.  HENT: Negative for congestion, ear discharge and nosebleeds.   Eyes: Negative for blurred vision.  Respiratory: Negative for cough, hemoptysis, sputum production, shortness of breath and wheezing.   Cardiovascular: Negative for chest pain, palpitations, orthopnea and claudication.  Gastrointestinal: Negative for abdominal pain, blood in stool, constipation, diarrhea, heartburn, melena, nausea and vomiting.  Genitourinary: Negative for dysuria, flank pain, frequency, hematuria and urgency.  Musculoskeletal: Negative for back pain, joint pain and myalgias.  Skin: Negative for rash.  Neurological: Negative for dizziness, tingling, focal weakness, seizures, weakness and headaches.  Endo/Heme/Allergies: Does not bruise/bleed easily.  Psychiatric/Behavioral: Negative for depression and suicidal ideas. The patient does not have insomnia.       No Known Allergies   Past Medical History:  Diagnosis Date  . Anxiety   . Arthritis   . Asthma   . Barrett's esophagus 2015  . COVID-19    03/10/20  . Depression   . GERD (gastroesophageal reflux disease)   . Hypertension   . Hypothyroidism   . Pneumonia   . Thyroid disease      Past Surgical History:  Procedure Laterality Date  . ABLATION  2006  . BREAST CYST ASPIRATION Left 1998  . BREAST SURGERY     cyst aspiration  . [place]  cyst aspiration  . COLONOSCOPY  03/2014  . COLONOSCOPY WITH PROPOFOL N/A 01/06/2020   Procedure: COLONOSCOPY WITH PROPOFOL;  Surgeon: Toledo, Benay Pike, MD;  Location: [facility] ENDOSCOPY;  Service: Gastroenterology;  Laterality: N/A;  . ENDOMETRIAL ABLATION    . ESOPHAGOGASTRODUODENOSCOPY (EGD) WITH PROPOFOL N/A  08/15/2015   Procedure: ESOPHAGOGASTRODUODENOSCOPY (EGD) WITH PROPOFOL;  Surgeon: Lollie Sails, MD;  Location[facility] ENDOSCOPY;  Service: Endoscopy;  Laterality: N/A;  . ESOPHAGOGASTRODUODENOSCOPY (EGD) WITH PROPOFOL N/A 01/22/2016   Procedure: ESOPHAGOGASTRODUODENOSCOPY (EGD) WITH PROPOFOL;  Surgeon: Lollie Sails, MD;  Location: [facility] ENDOSCOPY;  Service: Endoscopy;  Laterality: N/A;  . ESOPHAGOGASTRODUODENOSCOPY (EGD) WITH PROPOFOL N/A 01/06/2020   Procedure: ESOPHAGOGASTRODUODENOSCOPY (EGD) WITH PROPOFOL;  Surgeon: Toledo, Benay Pike, MD;  Location: [facility] ENDOSCOPY;  Service: Gastroenterology;  Laterality: N/A;  . GASTRIC BYPASS  2005  . HERNIA REPAIR    . NASAL SINUS SURGERY    . 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 TOTAL THYROIDECTOMY    . TUBAL LIGATION      Social History   Socioeconomic History  . Marital status: Divorced    Spouse name: Not on file  . Number of children: 2  . Years of education: Not on file  . Highest education level: High school graduate  Occupational History  . Not on file  Tobacco Use  . Smoking status: Current Every Day Smoker    Packs/day: 1.00    Years: 30.00    Pack years: 30.00    Types: Cigarettes    Start date: 03/15/1980  . Smokeless tobacco: Never Used  Vaping Use  . Vaping Use: Never used  Substance and Sexual Activity  . Alcohol use: Yes    Alcohol/week: 10.0 - 12.0 standard drinks    Types: 8 - 10 Glasses of wine, 2 Standard drinks or equivalent per week  . Drug use: No  . Sexual activity: Yes    Partners: Male  Other Topics Concern  . Not on file  Social History Narrative   Lives in [place] single, divorced       Work - 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       Diet - healthy diet   Exercise - limited   Caffeine use: daily   Social Determinants of Health   Financial Resource Strain: Not on file  Food Insecurity: Not on file  Transportation Needs: Not on file  Physical Activity: Not on file  Stress: Not on  file  Social Connections: Not on file  Intimate Partner Violence: Not on file    Family History  Problem Relation Age of Onset  . Heart disease Mother   . COPD Mother   . Arthritis Mother   . Cancer Mother        uterine  . Lupus Mother   . Anxiety disorder Mother   . Depression Mother   . Drug abuse Mother   . COPD Father   . Arthritis Father   . Heart disease Father   . Heart attack Father   . Alcohol abuse Father   . Heart disease Brother   . Heart attack Brother   . Drug abuse Brother   . Anxiety disorder Brother   . Depression Brother   . Heart disease Brother   . Cancer Brother        liver cancer age 58 y.o  . Diabetes Maternal Grandmother   . Diabetes Maternal Grandfather   . Diabetes Paternal Grandmother   . Diabetes  Paternal Grandfather      Current Outpatient Medications:  .  albuterol (VENTOLIN HFA) 108 (90 Base) MCG/ACT inhaler, Inhale 2 puffs into the lungs every 6 (six) hours as needed for wheezing or shortness of breath., Disp: 18 g, Rfl: 11 .  buPROPion (WELLBUTRIN XL) 150 MG 24 hr tablet, Take 1 tablet (150 mg total) by mouth daily., Disp: 90 tablet, Rfl: 3 .  clonazePAM (KLONOPIN) 1 MG tablet, Take 1 tablet (1 mg total) by mouth 2 (two) times daily as needed for anxiety., Disp: 60 tablet, Rfl: 5 .  cyanocobalamin (,VITAMIN B-12,) 1000 MCG/ML injection, Inject 1 mL (1,000 mcg total) into the muscle every 30 (thirty) days., Disp: 4 mL, Rfl: 12 .  hydroxychloroquine (PLAQUENIL) 200 MG tablet, Take 1 tablet (200 mg total) by mouth 2 (two) times daily., Disp: , Rfl:  .  lisinopril-hydrochlorothiazide (ZESTORETIC) 20-12.5 MG tablet, Take 2 tablets by mouth daily., Disp: 180 tablet, Rfl: 3 .  methocarbamol (ROBAXIN) 500 MG tablet, Take 500 mg by mouth 2 (two) times daily., Disp: , Rfl:  .  montelukast (SINGULAIR) 10 MG tablet, Take 1 tablet (10 mg total) by mouth at bedtime., Disp: 90 tablet, Rfl: 3 .  NEEDLE, DISP, 25 G 25G X 1-1/2" MISC, 1 Device by Does not  apply route every 30 (thirty) days., Disp: 30 each, Rfl: 2 .  pantoprazole (PROTONIX) 40 MG tablet, Take 1 tablet (40 mg total) by mouth daily. In am 30 min before food, Disp: 90 tablet, Rfl: 3 .  SYNTHROID 200 MCG tablet, TAKE 1 TABLET BY MOUTH ONCE daily before breakfast 30 minutes  Further refills from [facility] clinic endocrine Dr. Honor Junes, Marcello Moores no exceptions. Call to make appt endocrine, Disp: 90 tablet, Rfl: 3 .  venlafaxine XR (EFFEXOR-XR) 150 MG 24 hr capsule, Take 1 capsule (150 mg total) by mouth daily with breakfast., Disp: 90 capsule, Rfl: 3 .  budesonide-formoterol (SYMBICORT) 80-4.5 MCG/ACT inhaler, Inhale 2 puffs into the lungs 2 (two) times daily. Rinse mouth (Patient not taking: No sig reported), Disp: 1 Inhaler, Rfl: 11 .  cetirizine (ZYRTEC) 10 MG tablet, Take 1 tablet (10 mg total) by mouth daily. (Patient not taking: No sig reported), Disp: 90 tablet, Rfl: 3 .  Cholecalciferol 1.25 MG (50000 UT) capsule, Take 1 capsule (50,000 Units total) by mouth once a week. (Patient not taking: No sig reported), Disp: 13 capsule, Rfl: 3  Physical exam:  Physical Exam Constitutional:      General: She is not in acute distress. Eyes:     Extraocular Movements: EOM normal.  Cardiovascular:     Rate and Rhythm: Normal rate and regular rhythm.     Heart sounds: Normal heart sounds.  Pulmonary:     Effort: Pulmonary effort is normal.     Breath sounds: Normal breath sounds.  Abdominal:     General: Bowel sounds are normal.     Palpations: Abdomen is soft.  Skin:    General: Skin is warm and dry.  Neurological:     Mental Status: She is alert and oriented to person, place, and time.      CMP Latest Ref Rng & Units 06/20/2020  Glucose 70 - 99 mg/dL 101(H)  BUN 6 - 20 mg/dL 13  Creatinine 0.44 - 1.00 mg/dL 0.61  Sodium 135 - 145 mmol/L 122(L)  Potassium 3.5 - 5.1 mmol/L 4.0  Chloride 98 - 111 mmol/L 87(L)  CO2 22 - 32 mmol/L 24  Calcium 8.9 - 10.3 mg/dL 10.0  Total  Protein 6.5  - 8.1 g/dL 8.5(H)  Total Bilirubin 0.3 - 1.2 mg/dL 0.3  Alkaline Phos 38 - 126 U/L 40  AST 15 - 41 U/L 26  ALT 0 - 44 U/L 27   CBC Latest Ref Rng & Units 06/28/2020  WBC 4.0 - 10.5 K/uL 5.5  Hemoglobin 12.0 - 15.0 g/dL 13.2  Hematocrit 36.0 - 46.0 % 37.9  Platelets 150 - 400 K/uL 277                         MR Cervical Spine W Wo Contrast  Result Date: 07/20/2020 CLINICAL DATA:  Possible multiple myeloma.  Abnormal bone marrow. EXAM: MRI CERVICAL SPINE WITHOUT AND WITH CONTRAST TECHNIQUE: Multiplanar and multiecho pulse sequences of the cervical spine, to include the craniocervical junction and cervicothoracic junction, were obtained without and with intravenous contrast. CONTRAST:  7.64m GADAVIST GADOBUTROL 1 MMOL/ML IV SOLN COMPARISON:  None. FINDINGS: Images are degraded by motion. Alignment: Normal Vertebrae: No fracture, evidence of discitis, or bone lesion. Cord: Normal Posterior Fossa, vertebral arteries, paraspinal tissues: Normal Disc levels: There is no spinal canal stenosis or evidence of neural impingement. No abnormal contrast enhancement. IMPRESSION: Motion degraded study without evidence of multiple myeloma. Electronically Signed   By: KUlyses JarredM.D.   On: 07/20/2020 22:36   MR Thoracic Spine W Wo Contrast  Result Date: 07/20/2020 CLINICAL DATA:  Assessment for multiple myeloma. Abnormal bone marrow. EXAM: MRI THORACIC WITHOUT AND WITH CONTRAST TECHNIQUE: Multiplanar and multiecho pulse sequences of the thoracic spine were obtained without and with intravenous contrast. CONTRAST:  7.5382mGADAVIST GADOBUTROL 1 MMOL/ML IV SOLN COMPARISON:  None. FINDINGS: Alignment:  Physiologic. Vertebrae: No fracture, evidence of discitis, or bone lesion. Cord:  Normal signal and morphology. Paraspinal and other soft tissues: Negative. Disc levels: No spinal canal or neural foraminal stenosis. No abnormal contrast enhancement. IMPRESSION: Normal MRI of the thoracic spine.  Electronically Signed   By: KeUlyses Jarred.D.   On: 07/20/2020 22:23   MR Lumbar Spine W Wo Contrast  Result Date: 07/20/2020 CLINICAL DATA:  Abnormal bone marrow. Assessment for multiple myeloma. EXAM: MRI LUMBAR SPINE WITHOUT AND WITH CONTRAST TECHNIQUE: Multiplanar and multiecho pulse sequences of the lumbar spine were obtained without and with intravenous contrast. CONTRAST:  7.82m63mADAVIST GADOBUTROL 1 MMOL/ML IV SOLN COMPARISON:  None. FINDINGS: Segmentation:  Normal Alignment:  Normal Vertebrae: No fracture, evidence of discitis, or bone lesion. No abnormal contrast enhancement. Conus medullaris and cauda equina: Conus extends to the L1 level. Conus and cauda equina appear normal. Paraspinal and other soft tissues: Negative Disc levels: L1-L2: Minimal disc bulge. No spinal canal stenosis. No neural foraminal stenosis. L2-L3: Normal disc space and facet joints. No spinal canal stenosis. No neural foraminal stenosis. L3-L4: Small disc bulge and mild facet hypertrophy. No spinal canal stenosis. No neural foraminal stenosis. L4-L5: Small disc bulge. No spinal canal stenosis. No neural foraminal stenosis. L5-S1: Normal disc space and facet joints. No spinal canal stenosis. No neural foraminal stenosis. Visualized sacrum: Normal. IMPRESSION: 1. No evidence of multiple myeloma. 2. Mild lumbar degenerative disc disease without spinal canal or neural foraminal stenosis. Electronically Signed   By: KevUlyses JarredD.   On: 07/20/2020 22:27   MR PELVIS W WO CONTRAST  Result Date: 07/21/2020 CLINICAL DATA:  Evaluate for multiple myeloma. EXAM: MRI PELVIS WITHOUT AND WITH CONTRAST TECHNIQUE: Multiplanar multisequence MR imaging of the pelvis was performed both before and after administration of intravenous  contrast. CONTRAST:  7.37m GADAVIST GADOBUTROL 1 MMOL/ML IV SOLN COMPARISON:  Metastatic bone survey 06/20/2020 FINDINGS: Urinary Tract: The bladder is unremarkable. No bladder mass or calculi. Bowel: The rectum,  sigmoid colon and visualized small-bowel loops are grossly normal. Vascular/Lymphatic: No aneurysm or lymphadenopathy the. Reproductive: Retroverted uterus with uterine fibroids. Nabothian cysts are noted along the cervix. The ovaries are unremarkable. Other:  No free pelvic fluid collections or inguinal hernia. Musculoskeletal: Diffuse patchy E heterogeneous low T1 signal intensity also quite apparent on the T2 and postcontrast images. Findings consistent with a diffuse marrow process. Although this could be seen with smoking, anemia or osteoporosis I think it is most likely related to multiple myeloma given the patient's history. No fracture. There is a benign-appearing cystic lesion noted in the lowest sacral element. IMPRESSION: 1. Diffuse patchy heterogeneous marrow highly suspicious for multiple myeloma. 2. No pelvic lymphadenopathy. 3. Retroverted uterus with uterine fibroids. Electronically Signed   By: PMarijo SanesM.D.   On: 07/21/2020 10:40   CT BONE MARROW BIOPSY & ASPIRATION  Result Date: 06/28/2020 INDICATION: 55year old with abnormal SPEP and elevated calcium. Request for bone marrow biopsy. EXAM: CT GUIDED BONE MARROW ASPIRATES AND BIOPSY Physician: AStephan Minister HAnselm Pancoast MD MEDICATIONS: None. ANESTHESIA/SEDATION: Fentanyl 100 mcg IV; Versed 2.0 mg IV Moderate Sedation Time:  14 minutes The patient was continuously monitored during the procedure by the interventional radiology nurse under my direct supervision. COMPLICATIONS: None immediate. PROCEDURE: The procedure was explained to the patient. The risks and benefits of the procedure were discussed and the patient's questions were addressed. Informed consent was obtained from the patient. The patient was placed prone on CT table. Images of the pelvis were obtained. The right side of back was prepped and draped in sterile fashion. The skin and right posterior ilium were anesthetized with 1% lidocaine. 11 gauge bone needle was directed into the right  ilium with CT guidance. Two aspirates and one core biopsy were obtained. Bandage placed over the puncture site. IMPRESSION: CT guided bone marrow aspiration and core biopsy. Electronically Signed   By: AMarkus DaftM.D.   On: 06/28/2020 11:24     Assessment and plan- Patient is a 55y.o. female with abnormal SPEP here to discuss the results of blood work  Patient was referred to me for suspicion of multiple myeloma.  I discussed the results of the blood work with her in detail.  Myeloma panel Shows an elevated IgG of 3668 with an M protein of 3.1 which was IgG lambda.  Serum free light chain ratio is elevated at 25.  Calcium levels are at 10 and have never been more than 11 correcting for albumin.  Serum creatinine is normal.  She is not anemic and her hemoglobin is remained stable around 12-13 over the last 2 years.  Myeloma panel shows 22% plasma cells.  Bone survey and MRI spine did not reveal any evidence of lytic lesions.  MRI pelvis did not again show definitive lytic lesion but showed a heterogeneous patchy appearance.  [facility], she does not fit the criteria of overt multiple myeloma yet and can be best classified as smoldering multiple myeloma.  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 which is an adverse prognostic factor in multiple myeloma.  Also it would be reasonable to monitor her  closely given the ongoing Covid omicron surge since she does not clearly meet criteria for overt multiple myeloma.  My plan is to monitor CBC with differential, CMP, myeloma panel and serum free light chains in 2 months in 4 months and I will see her back in 4 months.  Patient is yet to give is a 24-hour urine sample which she said she will be doing it soon.   Visit Diagnosis 1. Smoldering multiple myeloma  ([place])   2. Goals of care, counseling/discussion      Dr. Randa Evens, MD, MPH [facility] [phone] 07/24/2020 8:37 AM

## 2020-07-28 ENCOUNTER — Other Ambulatory Visit: Payer: Self-pay | Admitting: Internal Medicine

## 2020-07-28 DIAGNOSIS — B379 Candidiasis, unspecified: Secondary | ICD-10-CM

## 2020-07-28 NOTE — Telephone Encounter (Signed)
Please advise to refill 

## 2020-08-01 ENCOUNTER — Other Ambulatory Visit: Payer: Self-pay | Admitting: Internal Medicine

## 2020-08-01 DIAGNOSIS — F419 Anxiety disorder, unspecified: Secondary | ICD-10-CM

## 2020-08-01 DIAGNOSIS — G4700 Insomnia, unspecified: Secondary | ICD-10-CM

## 2020-09-18 ENCOUNTER — Inpatient Hospital Stay: Payer: Managed Care, Other (non HMO) | Attending: Oncology

## 2020-09-18 DIAGNOSIS — Z9884 Bariatric surgery status: Secondary | ICD-10-CM | POA: Insufficient documentation

## 2020-09-18 DIAGNOSIS — F17210 Nicotine dependence, cigarettes, uncomplicated: Secondary | ICD-10-CM | POA: Diagnosis not present

## 2020-09-18 DIAGNOSIS — E039 Hypothyroidism, unspecified: Secondary | ICD-10-CM | POA: Diagnosis not present

## 2020-09-18 DIAGNOSIS — Z8719 Personal history of other diseases of the digestive system: Secondary | ICD-10-CM | POA: Diagnosis not present

## 2020-09-18 DIAGNOSIS — C9000 Multiple myeloma not having achieved remission: Secondary | ICD-10-CM | POA: Insufficient documentation

## 2020-09-18 DIAGNOSIS — E559 Vitamin D deficiency, unspecified: Secondary | ICD-10-CM | POA: Insufficient documentation

## 2020-09-18 DIAGNOSIS — D472 Monoclonal gammopathy: Secondary | ICD-10-CM

## 2020-09-18 DIAGNOSIS — E538 Deficiency of other specified B group vitamins: Secondary | ICD-10-CM | POA: Insufficient documentation

## 2020-09-18 LAB — COMPREHENSIVE METABOLIC PANEL
ALT: 22 U/L (ref 0–44)
AST: 33 U/L (ref 15–41)
Albumin: 3.3 g/dL — ABNORMAL LOW (ref 3.5–5.0)
Alkaline Phosphatase: 46 U/L (ref 38–126)
Anion gap: 11 (ref 5–15)
BUN: 14 mg/dL (ref 6–20)
CO2: 24 mmol/L (ref 22–32)
Calcium: 10.2 mg/dL (ref 8.9–10.3)
Chloride: 93 mmol/L — ABNORMAL LOW (ref 98–111)
Creatinine, Ser: 0.78 mg/dL (ref 0.44–1.00)
GFR, Estimated: >60 mL/min (ref >60)
Glucose, Bld: 154 mg/dL — ABNORMAL HIGH (ref 70–99)
Potassium: 4.1 mmol/L (ref 3.5–5.1)
Sodium: 128 mmol/L — ABNORMAL LOW (ref 135–145)
Total Bilirubin: 0.7 mg/dL (ref 0.3–1.2)
Total Protein: 8.8 g/dL — ABNORMAL HIGH (ref 6.5–8.1)

## 2020-09-18 LAB — CBC WITH DIFFERENTIAL/PLATELET
Abs Immature Granulocytes: 0.02 10*3/uL (ref 0.00–0.07)
Basophils Absolute: 0 10*3/uL (ref 0.0–0.1)
Basophils Relative: 1 %
Eosinophils Absolute: 0 10*3/uL (ref 0.0–0.5)
Eosinophils Relative: 0 %
HCT: 35.9 % — ABNORMAL LOW (ref 36.0–46.0)
Hemoglobin: 12.5 g/dL (ref 12.0–15.0)
Immature Granulocytes: 0 %
Lymphocytes Relative: 25 %
Lymphs Abs: 1.7 10*3/uL (ref 0.7–4.0)
MCH: 30.6 pg (ref 26.0–34.0)
MCHC: 34.8 g/dL (ref 30.0–36.0)
MCV: 87.8 fL (ref 80.0–100.0)
Monocytes Absolute: 0.5 10*3/uL (ref 0.1–1.0)
Monocytes Relative: 8 %
Neutro Abs: 4.3 10*3/uL (ref 1.7–7.7)
Neutrophils Relative %: 66 %
Platelets: 251 10*3/uL (ref 150–400)
RBC: 4.09 MIL/uL (ref 3.87–5.11)
RDW: 15 % (ref 11.5–15.5)
WBC: 6.6 10*3/uL (ref 4.0–10.5)
nRBC: 0 % (ref 0.0–0.2)

## 2020-09-19 LAB — KAPPA/LAMBDA LIGHT CHAINS
Kappa free light chain: 13 mg/L (ref 3.3–19.4)
Kappa, lambda light chain ratio: 0.05 — ABNORMAL LOW (ref 0.26–1.65)
Lambda free light chains: 239.7 mg/L — ABNORMAL HIGH (ref 5.7–26.3)

## 2020-09-21 LAB — MULTIPLE MYELOMA PANEL, SERUM
Albumin SerPl Elph-Mcnc: 3.7 g/dL (ref 2.9–4.4)
Albumin/Glob SerPl: 0.9 (ref 0.7–1.7)
Alpha 1: 0.3 g/dL (ref 0.0–0.4)
Alpha2 Glob SerPl Elph-Mcnc: 0.6 g/dL (ref 0.4–1.0)
B-Globulin SerPl Elph-Mcnc: 3.5 g/dL — ABNORMAL HIGH (ref 0.7–1.3)
Gamma Glob SerPl Elph-Mcnc: 0.2 g/dL — ABNORMAL LOW (ref 0.4–1.8)
Globulin, Total: 4.6 g/dL — ABNORMAL HIGH (ref 2.2–3.9)
IgA: 27 mg/dL — ABNORMAL LOW (ref 87–352)
IgG (Immunoglobin G), Serum: 3859 mg/dL — ABNORMAL HIGH (ref 586–1602)
IgM (Immunoglobulin M), Srm: 43 mg/dL (ref 26–217)
M Protein SerPl Elph-Mcnc: 2.7 g/dL — ABNORMAL HIGH
Total Protein ELP: 8.3 g/dL (ref 6.0–8.5)

## 2020-09-22 LAB — PROTEIN ELECTRO, RANDOM URINE
Albumin ELP, Urine: 5.6 %
Alpha-1-Globulin, U: 1.3 %
Alpha-2-Globulin, U: 5.2 %
Beta Globulin, U: 83.8 %
Gamma Globulin, U: 4.2 %
M Component, Ur: 51.9 % — ABNORMAL HIGH
Total Protein, Urine: 56.7 mg/dL

## 2020-09-25 ENCOUNTER — Telehealth: Payer: Self-pay | Admitting: Internal Medicine

## 2020-09-25 DIAGNOSIS — G4700 Insomnia, unspecified: Secondary | ICD-10-CM

## 2020-09-25 DIAGNOSIS — F419 Anxiety disorder, unspecified: Secondary | ICD-10-CM

## 2020-09-25 MED ORDER — CLONAZEPAM 1 MG PO TABS: ORAL_TABLET | ORAL | 0 refills | Status: DC

## 2020-09-25 NOTE — Telephone Encounter (Signed)
Pt called and advised on below. Pt verbalized understanding of this.

## 2020-09-25 NOTE — Telephone Encounter (Signed)
Patient is on clonazePAM (KLONOPIN) 1 MG tablet. She is going out of town tomorrow morning. She will run out of medication while she is gone. Could she get an emergency refill of 7 to 10 days until she gets back in town.

## 2020-09-25 NOTE — Telephone Encounter (Signed)
Call pt I am covering for [employer] Appears she picked up klonopin 08/29/20 so she should not be out of medication tomorrow 09/26/20. Please emphasize that she can only take twice per day prn  I have refilled however for 10 days  I looked up patient on [place] Controlled Substances Reporting System PMP AWARE and saw no activity that raised concern of inappropriate use.

## 2020-10-18 ENCOUNTER — Telehealth (INDEPENDENT_AMBULATORY_CARE_PROVIDER_SITE_OTHER): Payer: Managed Care, Other (non HMO) | Admitting: Internal Medicine

## 2020-10-18 ENCOUNTER — Encounter: Payer: Self-pay | Admitting: Internal Medicine

## 2020-10-18 ENCOUNTER — Other Ambulatory Visit: Payer: Self-pay

## 2020-10-18 VITALS — BP 148/99 | HR 77 | Ht 63.0 in | Wt 171.0 lb

## 2020-10-18 DIAGNOSIS — E785 Hyperlipidemia, unspecified: Secondary | ICD-10-CM

## 2020-10-18 DIAGNOSIS — Z113 Encounter for screening for infections with a predominantly sexual mode of transmission: Secondary | ICD-10-CM

## 2020-10-18 DIAGNOSIS — F439 Reaction to severe stress, unspecified: Secondary | ICD-10-CM

## 2020-10-18 DIAGNOSIS — F419 Anxiety disorder, unspecified: Secondary | ICD-10-CM

## 2020-10-18 DIAGNOSIS — F32A Depression, unspecified: Secondary | ICD-10-CM

## 2020-10-18 DIAGNOSIS — D472 Monoclonal gammopathy: Secondary | ICD-10-CM

## 2020-10-18 DIAGNOSIS — I10 Essential (primary) hypertension: Secondary | ICD-10-CM | POA: Diagnosis not present

## 2020-10-18 DIAGNOSIS — R5383 Other fatigue: Secondary | ICD-10-CM

## 2020-10-18 DIAGNOSIS — R7303 Prediabetes: Secondary | ICD-10-CM

## 2020-10-18 DIAGNOSIS — Z1231 Encounter for screening mammogram for malignant neoplasm of breast: Secondary | ICD-10-CM

## 2020-10-18 DIAGNOSIS — B379 Candidiasis, unspecified: Secondary | ICD-10-CM | POA: Diagnosis not present

## 2020-10-18 DIAGNOSIS — N760 Acute vaginitis: Secondary | ICD-10-CM

## 2020-10-18 DIAGNOSIS — R42 Dizziness and giddiness: Secondary | ICD-10-CM

## 2020-10-18 DIAGNOSIS — Z13220 Encounter for screening for lipoid disorders: Secondary | ICD-10-CM

## 2020-10-18 DIAGNOSIS — E039 Hypothyroidism, unspecified: Secondary | ICD-10-CM

## 2020-10-18 DIAGNOSIS — E538 Deficiency of other specified B group vitamins: Secondary | ICD-10-CM

## 2020-10-18 DIAGNOSIS — J40 Bronchitis, not specified as acute or chronic: Secondary | ICD-10-CM

## 2020-10-18 DIAGNOSIS — G4700 Insomnia, unspecified: Secondary | ICD-10-CM

## 2020-10-18 DIAGNOSIS — N3000 Acute cystitis without hematuria: Secondary | ICD-10-CM

## 2020-10-18 DIAGNOSIS — E871 Hypo-osmolality and hyponatremia: Secondary | ICD-10-CM

## 2020-10-18 DIAGNOSIS — C9000 Multiple myeloma not having achieved remission: Secondary | ICD-10-CM

## 2020-10-18 DIAGNOSIS — D259 Leiomyoma of uterus, unspecified: Secondary | ICD-10-CM

## 2020-10-18 DIAGNOSIS — E559 Vitamin D deficiency, unspecified: Secondary | ICD-10-CM

## 2020-10-18 DIAGNOSIS — D649 Anemia, unspecified: Secondary | ICD-10-CM

## 2020-10-18 MED ORDER — VENLAFAXINE HCL ER 150 MG PO CP24: 150.0000 mg | ORAL_CAPSULE | Freq: Every day | ORAL | 3 refills | Status: DC

## 2020-10-18 MED ORDER — ALBUTEROL SULFATE HFA 108 (90 BASE) MCG/ACT IN AERS: 2.0000 | INHALATION_SPRAY | Freq: Four times a day (QID) | RESPIRATORY_TRACT | 11 refills | Status: DC | PRN

## 2020-10-18 MED ORDER — AMLODIPINE BESYLATE 2.5 MG PO TABS: 2.5000 mg | ORAL_TABLET | Freq: Every day | ORAL | 3 refills | Status: DC

## 2020-10-18 MED ORDER — FLUCONAZOLE 150 MG PO TABS
150.0000 mg | ORAL_TABLET | Freq: Once | ORAL | 0 refills | Status: AC
Start: 2020-10-18 — End: 2020-10-18

## 2020-10-18 MED ORDER — TELMISARTAN 80 MG PO TABS: ORAL_TABLET | ORAL | 3 refills | Status: DC

## 2020-10-18 MED ORDER — CLONAZEPAM 1 MG PO TABS: ORAL_TABLET | ORAL | 2 refills | Status: DC

## 2020-10-18 MED ORDER — BUDESONIDE-FORMOTEROL FUMARATE 80-4.5 MCG/ACT IN AERO: 2.0000 | INHALATION_SPRAY | Freq: Two times a day (BID) | RESPIRATORY_TRACT | 11 refills | Status: DC

## 2020-10-18 NOTE — Patient Instructions (Signed)
Consider psychiatry and therapy   [facility]  [place] [phone]   [facility] [place]  [address] [place] [address]  [phone]  psychiatry Dr. Louann Liv (lady)  [place] [address] [phone]

## 2020-10-18 NOTE — Progress Notes (Signed)
Virtual Visit via Video Note  I connected with Paula Garrett   on 10/18/20 at  3:30 PM EDT by a video enabled telemedicine application and verified that I am speaking with the correct person using two identifiers.  Location patient: friends house  Location provider:work or home office Persons participating in the virtual visit: patient, provider  I discussed the limitations of evaluation and management by telemedicine and the availability of in person appointments. The patient expressed understanding and agreed to proceed.   HPI: 1. 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 She is not depressed or SI  She is on klonopin 1 mg bid wellbutrin 150 mg xl (not taking) and effexor 150 mg xr  We discuss rec psych and therapy for sx's above   3. C/o vaginal itching and discharge 4.smoldering MM f/u with Paula Garrett CT C/T/L spine overall normal and being monitored and will consider chemo, meds and BMT in future if declines for now stable  MRI 07/20/20 pelvis  IMPRESSION: 1. Diffuse patchy heterogeneous marrow highly suspicious for multiple myeloma.  -COVID-19 vaccine status: 3/3   ROS: See pertinent positives and negatives per HPI.  Past Medical History:  Diagnosis Date  . Anxiety   . Arthritis   . Asthma   . Barrett's esophagus 2015  . COVID-19    03/10/20  . Depression   . GERD (gastroesophageal reflux disease)   . Hypertension   . Hypothyroidism   . Pneumonia   . Thyroid disease     Past Surgical History:   Procedure Laterality Date  . ABLATION  2006  . BREAST CYST ASPIRATION Left 1998  . BREAST SURGERY     cyst aspiration  . BREAST SURGERY  1998   cyst aspiration  . COLONOSCOPY  03/2014  . COLONOSCOPY WITH PROPOFOL N/A 01/06/2020   Procedure: COLONOSCOPY WITH PROPOFOL;  Surgeon: Toledo, Benay Pike, MD;  Location: [facility] ENDOSCOPY;  Service: Gastroenterology;  Laterality: N/A;  . ENDOMETRIAL ABLATION    . ESOPHAGOGASTRODUODENOSCOPY (EGD) WITH PROPOFOL N/A 08/15/2015   Procedure: ESOPHAGOGASTRODUODENOSCOPY (EGD) WITH PROPOFOL;  Surgeon: Paula Sails, MD;  Location: [facility] ENDOSCOPY;  Service: Endoscopy;  Laterality: N/A;  . ESOPHAGOGASTRODUODENOSCOPY (EGD) WITH PROPOFOL N/A 01/22/2016   Procedure: ESOPHAGOGASTRODUODENOSCOPY (EGD) WITH PROPOFOL;  Surgeon: Paula Sails, MD;  Location: [facility] ENDOSCOPY;  Service: Endoscopy;  Laterality: N/A;  . ESOPHAGOGASTRODUODENOSCOPY (EGD) WITH PROPOFOL N/A 01/06/2020   Procedure: ESOPHAGOGASTRODUODENOSCOPY (EGD) WITH PROPOFOL;  Surgeon: Toledo, Benay Pike, MD;  Location: [facility] ENDOSCOPY;  Service: Gastroenterology;  Laterality: N/A;  . GASTRIC BYPASS  2005  . HERNIA REPAIR    . NASAL SINUS SURGERY    . 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 TOTAL THYROIDECTOMY    . TUBAL LIGATION       Current Outpatient Medications:  .  amLODipine (NORVASC) 2.5 MG tablet, Take 1 tablet (2.5 mg total) by mouth daily., Disp: 90 tablet, Rfl: 3 .  cetirizine (ZYRTEC) 10  MG tablet, Take 1 tablet (10 mg total) by mouth daily., Disp: 90 tablet, Rfl: 3 .  Cholecalciferol 1.25 MG (50000 UT) capsule, Take 1 capsule (50,000 Units total) by mouth once a week., Disp: 13 capsule, Rfl: 3 .  cyanocobalamin (,VITAMIN B-12,) 1000 MCG/ML injection, Inject 1 mL (1,000 mcg total) into the muscle every 30 (thirty) days., Disp: 4 mL, Rfl: 12 .  methocarbamol (ROBAXIN) 500 MG tablet, Take 500 mg by mouth 2 (two) times daily., Disp: , Rfl:  .  montelukast (SINGULAIR) 10 MG tablet, Take 1  tablet (10 mg total) by mouth at bedtime., Disp: 90 tablet, Rfl: 3 .  NEEDLE, DISP, 25 G 25G X 1-1/2" MISC, 1 Device by Does not apply route every 30 (thirty) days., Disp: 30 each, Rfl: 2 .  SYNTHROID 200 MCG tablet, TAKE 1 TABLET BY MOUTH ONCE daily before breakfast 30 minutes  Further refills from [facility] endocrine Dr. Honor Garrett, Paula Garrett no exceptions. Call to make appt endocrine, Disp: 90 tablet, Rfl: 3 .  telmisartan (MICARDIS) 80 MG tablet, 1/2 pill x 4 days in the am and day 5 1 whole pill if BP >130/>80 goal BP <130/<80. D/c lisinopril 20-hctz 12.5 2 pills daily, Disp: 90 tablet, Rfl: 3 .  albuterol (VENTOLIN HFA) 108 (90 Base) MCG/ACT inhaler, Inhale 2 puffs into the lungs every 6 (six) hours as needed for wheezing or shortness of breath., Disp: 18 g, Rfl: 11 .  budesonide-formoterol (SYMBICORT) 80-4.5 MCG/ACT inhaler, Inhale 2 puffs into the lungs 2 (two) times daily. Rinse mouth, Disp: 1 each, Rfl: 11 .  buPROPion (WELLBUTRIN XL) 150 MG 24 hr tablet, Take 1 tablet (150 mg total) by mouth daily. (Patient not taking: Reported on 10/18/2020), Disp: 90 tablet, Rfl: 3 .  clonazePAM (KLONOPIN) 1 MG tablet, TAKE 1 TABLET(1 MG) BY MOUTH TWICE DAILY AS NEEDED FOR ANXIETY, Disp: 60 tablet, Rfl: 2 .  fluconazole (DIFLUCAN) 150 MG tablet, Take 1 tablet (150 mg total) by mouth once for 1 dose. May repeat in 3 days if needed another dose, Disp: 2 tablet, Rfl: 0 .  pantoprazole (PROTONIX) 40 MG tablet, Take 1 tablet (40 mg total) by mouth daily. In am 30 min before food (Patient not taking: Reported on 10/18/2020), Disp: 90 tablet, Rfl: 3 .  venlafaxine XR (EFFEXOR-XR) 150 MG 24 hr capsule, Take 1 capsule (150 mg total) by mouth daily with breakfast., Disp: 90 capsule, Rfl: 3  EXAM:  VITALS per patient if applicable:  GENERAL: alert, oriented, appears well and in no acute distress  HEENT: atraumatic, conjunttiva clear, no obvious abnormalities on inspection of external nose and ears  NECK: normal  movements of the head and neck  LUNGS: on inspection no signs of respiratory distress, breathing rate appears normal, no obvious gross SOB, gasping or wheezing  CV: no obvious cyanosis  MS: moves all visible extremities without noticeable abnormality  PSYCH/NEURO: pleasant and cooperative, no obvious depression or anxiety, speech and thought processing grossly intact  ASSESSMENT AND PLAN:  Discussed the following assessment and plan:  Hypertension, unspecified type - Plan: telmisartan (MICARDIS) 80 MG tablet 1/2 pill x 4 days then increase to 1 pill qd if BP >130/>80 amLODipine (NORVASC) 2.5 MG tablet Stop lis 20-12.5 2 pills daily due to hyponatremia 128 09/18/20 labs  If dizziness continues consider brain imaging  Monitor BP  If BP continues to spike consider titrate off effexor   Acute vaginitis Yeast infection - Plan: fluconazole (DIFLUCAN) 150 MG tablet  Stress Anxiety w/o  depression/SI - Plan: clonazePAM (KLONOPIN) 1 MG tablet Insomnia, unspecified type - Plan: clonazePAM (KLONOPIN) 1 MG tablet bid prn She is on klonopin 1 mg bid wellbutrin 150 mg xl (not taking) and effexor 150 mg xr  We discuss rec psych and therapy for sx's above  Consider psychiatry and therapy   [facility] [place]  [place] [phone]   [facility] [place]  [address]  [place] [address]  [phone]  psychiatry Dr. 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 Janese Garrett  Bronchitis - Plan: budesonide-formoterol (SYMBICORT) 80-4.5 MCG/ACT inhaler, albuterol (VENTOLIN HFA) 108 (90 Base) MCG/ACT inhaler rec smoking cessation    [facility] Fasting labs 10/27/20 Flu shotutd pna 23 had 02/02/20  covid shot had 3/3 pfizer covid 03/10/20 +  Tdap UTD10/25/13 rec hepBvaccinefor future(consider new x  2 doses) Consider MMR in future  mammo2/10/21neg, ordered  Papneg 11/20/17 no HPV will due pap at f/u 11/21/20  EGD/colonoscopy had [facility] , need to get report colonoscopy 10/10/15[facility] Also EGD due 01/22/2019 barretts last 01/21/14 no dysplasia [facility]Dr. ToledoEGD/Colonoscopy barretts neg bx f/u in 3 years egd and 5 years colonoscopy   Skin8/18/21no issues  rec smoking cesasationand exercise to lose with healthy diet choiceswaist 46.5 in today  F/u [facility] Endocrine Dr. Eldridge Scot get on wegovy vs saxenda pt appt 12/11/20    -we discussed possible serious and likely etiologies, options for evaluation and workup, limitations of telemedicine visit vs in person visit, treatment, treatment risks and precautions.    Discussed options for inperson care if PCP office not available. Did let this patient know that I only do telemedicine on Tuesdays and Thursdays for [place]. Advised to schedule follow up visit with PCP or [facility] if any further questions or concerns to avoid delays in care.   I discussed the assessment and treatment plan with the patient. The patient was provided an opportunity to ask questions and all were answered. The patient agreed with the plan and demonstrated an understanding of the instructions.    Time spent 30 minutes  Delorise Jackson, MD

## 2020-10-18 NOTE — Progress Notes (Signed)
Lightheadedness and abnormal blood pressure. Patient states has not been taking blood pressure medication everyday.  Patient also having allergy issues. Patient was also diagnosed with myeloma.  Bp this morning was 157/95 Took meds and checked 2 hours later 175/95.  Just took again an hour ago 145/81.

## 2020-10-20 ENCOUNTER — Telehealth: Payer: Self-pay | Admitting: Internal Medicine

## 2020-10-20 ENCOUNTER — Other Ambulatory Visit: Payer: Self-pay | Admitting: Internal Medicine

## 2020-10-20 DIAGNOSIS — K219 Gastro-esophageal reflux disease without esophagitis: Secondary | ICD-10-CM

## 2020-10-20 NOTE — Telephone Encounter (Signed)
Prior authorization has been submitted for patient's budesonide-formoterol (SYMBICORT) 80-4.5 MCG/ACT inhaler  Awaiting approval or denial.

## 2020-10-27 ENCOUNTER — Other Ambulatory Visit: Payer: Self-pay

## 2020-10-27 ENCOUNTER — Other Ambulatory Visit (INDEPENDENT_AMBULATORY_CARE_PROVIDER_SITE_OTHER): Payer: Managed Care, Other (non HMO)

## 2020-10-27 DIAGNOSIS — I10 Essential (primary) hypertension: Secondary | ICD-10-CM | POA: Diagnosis not present

## 2020-10-27 DIAGNOSIS — R5383 Other fatigue: Secondary | ICD-10-CM | POA: Diagnosis not present

## 2020-10-27 DIAGNOSIS — R7303 Prediabetes: Secondary | ICD-10-CM

## 2020-10-27 DIAGNOSIS — E559 Vitamin D deficiency, unspecified: Secondary | ICD-10-CM

## 2020-10-27 DIAGNOSIS — Z113 Encounter for screening for infections with a predominantly sexual mode of transmission: Secondary | ICD-10-CM

## 2020-10-27 DIAGNOSIS — E039 Hypothyroidism, unspecified: Secondary | ICD-10-CM

## 2020-10-27 DIAGNOSIS — E538 Deficiency of other specified B group vitamins: Secondary | ICD-10-CM

## 2020-10-27 DIAGNOSIS — Z13220 Encounter for screening for lipoid disorders: Secondary | ICD-10-CM

## 2020-10-27 DIAGNOSIS — D649 Anemia, unspecified: Secondary | ICD-10-CM | POA: Diagnosis not present

## 2020-10-27 DIAGNOSIS — E785 Hyperlipidemia, unspecified: Secondary | ICD-10-CM | POA: Diagnosis not present

## 2020-10-27 DIAGNOSIS — E871 Hypo-osmolality and hyponatremia: Secondary | ICD-10-CM

## 2020-10-27 DIAGNOSIS — N760 Acute vaginitis: Secondary | ICD-10-CM

## 2020-10-27 LAB — COMPREHENSIVE METABOLIC PANEL
ALT: 11 U/L (ref 0–35)
AST: 17 U/L (ref 0–37)
Albumin: 3.3 g/dL — ABNORMAL LOW (ref 3.5–5.2)
Alkaline Phosphatase: 43 U/L (ref 39–117)
BUN: 11 mg/dL (ref 6–23)
CO2: 28 mEq/L (ref 19–32)
Calcium: 10.3 mg/dL (ref 8.4–10.5)
Chloride: 99 mEq/L (ref 96–112)
Creatinine, Ser: 0.67 mg/dL (ref 0.40–1.20)
GFR: 97.22 mL/min (ref >60.00)
Glucose, Bld: 72 mg/dL (ref 70–99)
Potassium: 4.1 mEq/L (ref 3.5–5.1)
Sodium: 137 mEq/L (ref 135–145)
Total Bilirubin: 0.3 mg/dL (ref 0.2–1.2)
Total Protein: 8.3 g/dL (ref 6.0–8.3)

## 2020-10-27 LAB — CBC WITH DIFFERENTIAL/PLATELET
Basophils Absolute: 0 10*3/uL (ref 0.0–0.1)
Basophils Relative: 1 % (ref 0.0–3.0)
Eosinophils Absolute: 0 10*3/uL (ref 0.0–0.7)
Eosinophils Relative: 1.2 % (ref 0.0–5.0)
HCT: 36.5 % (ref 36.0–46.0)
Hemoglobin: 12.4 g/dL (ref 12.0–15.0)
Lymphocytes Relative: 41.1 % (ref 12.0–46.0)
Lymphs Abs: 1.4 10*3/uL (ref 0.7–4.0)
MCHC: 33.9 g/dL (ref 30.0–36.0)
MCV: 93.3 fl (ref 78.0–100.0)
Monocytes Absolute: 0.3 10*3/uL (ref 0.1–1.0)
Monocytes Relative: 9.7 % (ref 3.0–12.0)
Neutro Abs: 1.6 10*3/uL (ref 1.4–7.7)
Neutrophils Relative %: 47 % (ref 43.0–77.0)
Platelets: 236 10*3/uL (ref 150.0–400.0)
RBC: 3.91 Mil/uL (ref 3.87–5.11)
RDW: 16.1 % — ABNORMAL HIGH (ref 11.5–15.5)
WBC: 3.4 10*3/uL — ABNORMAL LOW (ref 4.0–10.5)

## 2020-10-27 LAB — LIPID PANEL
Cholesterol: 241 mg/dL — ABNORMAL HIGH (ref 0–200)
HDL: 107.6 mg/dL (ref >39.00)
LDL Cholesterol: 122 mg/dL — ABNORMAL HIGH (ref 0–99)
NonHDL: 133.31
Total CHOL/HDL Ratio: 2
Triglycerides: 58 mg/dL (ref 0.0–149.0)
VLDL: 11.6 mg/dL (ref 0.0–40.0)

## 2020-10-27 LAB — VITAMIN D 25 HYDROXY (VIT D DEFICIENCY, FRACTURES): VITD: 51 ng/mL (ref 30.00–100.00)

## 2020-10-27 LAB — VITAMIN B12: Vitamin B-12: 435 pg/mL (ref 211–911)

## 2020-10-27 LAB — TSH: TSH: 28.7 u[IU]/mL — ABNORMAL HIGH (ref 0.35–4.50)

## 2020-10-27 LAB — HEMOGLOBIN A1C: Hgb A1c MFr Bld: 5.6 % (ref 4.6–6.5)

## 2020-10-27 LAB — T4, FREE: Free T4: 1 ng/dL (ref 0.60–1.60)

## 2020-10-28 LAB — URINALYSIS, ROUTINE W REFLEX MICROSCOPIC
Bilirubin, UA: NEGATIVE
Glucose, UA: NEGATIVE
Ketones, UA: NEGATIVE
Nitrite, UA: POSITIVE — AB
RBC, UA: NEGATIVE
Specific Gravity, UA: 1.012 (ref 1.005–1.030)
Urobilinogen, Ur: 0.2 mg/dL (ref 0.2–1.0)
pH, UA: 6 (ref 5.0–7.5)

## 2020-10-28 LAB — SODIUM, URINE, RANDOM: Sodium, Ur: 95 mmol/L

## 2020-10-28 LAB — IRON,TIBC AND FERRITIN PANEL
Ferritin: 15 ng/mL (ref 15–150)
Iron Saturation: 13 % — ABNORMAL LOW (ref 15–55)
Iron: 40 ug/dL (ref 27–159)
Total Iron Binding Capacity: 304 ug/dL (ref 250–450)
UIBC: 264 ug/dL (ref 131–425)

## 2020-10-28 LAB — MICROALBUMIN / CREATININE URINE RATIO
Creatinine, Urine: 57.2 mg/dL
Microalb/Creat Ratio: 32 mg/g creat — ABNORMAL HIGH (ref 0–29)
Microalbumin, Urine: 18.1 ug/mL

## 2020-10-28 LAB — MICROSCOPIC EXAMINATION
Casts: NONE SEEN /lpf
RBC, Urine: NONE SEEN /hpf (ref 0–2)
WBC, UA: >30 /hpf — AB (ref 0–5)

## 2020-10-28 LAB — HIV ANTIBODY (ROUTINE TESTING W REFLEX): HIV Screen 4th Generation wRfx: NONREACTIVE

## 2020-10-29 ENCOUNTER — Encounter: Payer: Self-pay | Admitting: Internal Medicine

## 2020-10-30 ENCOUNTER — Other Ambulatory Visit: Payer: Self-pay | Admitting: Internal Medicine

## 2020-10-30 DIAGNOSIS — J40 Bronchitis, not specified as acute or chronic: Secondary | ICD-10-CM

## 2020-10-30 MED ORDER — BUDESONIDE-FORMOTEROL FUMARATE 160-4.5 MCG/ACT IN AERO: 2.0000 | INHALATION_SPRAY | Freq: Two times a day (BID) | RESPIRATORY_TRACT | 11 refills | Status: DC

## 2020-11-06 ENCOUNTER — Encounter: Payer: Self-pay | Admitting: Internal Medicine

## 2020-11-06 ENCOUNTER — Other Ambulatory Visit: Payer: Self-pay | Admitting: Internal Medicine

## 2020-11-06 DIAGNOSIS — R300 Dysuria: Secondary | ICD-10-CM

## 2020-11-07 NOTE — Addendum Note (Signed)
Addended by: Orland Mustard on: 11/07/2020 08:44 AM   Modules accepted: Orders

## 2020-11-08 NOTE — Telephone Encounter (Signed)
Sent 160/4.5 dose symbicort

## 2020-11-08 NOTE — Telephone Encounter (Signed)
Prior authorization has been denied.

## 2020-11-10 ENCOUNTER — Telehealth: Payer: Self-pay | Admitting: Oncology

## 2020-11-10 NOTE — Telephone Encounter (Signed)
VM left to notify patient that her appt on 6/16 needs to be rescheduled to due a change in MD clinic days.

## 2020-11-21 ENCOUNTER — Ambulatory Visit (INDEPENDENT_AMBULATORY_CARE_PROVIDER_SITE_OTHER): Payer: Managed Care, Other (non HMO) | Admitting: Internal Medicine

## 2020-11-21 ENCOUNTER — Encounter: Payer: Self-pay | Admitting: Internal Medicine

## 2020-11-21 ENCOUNTER — Other Ambulatory Visit (HOSPITAL_COMMUNITY)
Admission: RE | Admit: 2020-11-21 | Discharge: 2020-11-21 | Disposition: A | Discharge status: Home / Self Care | Payer: Managed Care, Other (non HMO) | Source: Ambulatory Visit | Attending: Internal Medicine | Admitting: Internal Medicine

## 2020-11-21 ENCOUNTER — Other Ambulatory Visit: Payer: Self-pay

## 2020-11-21 VITALS — BP 136/78 | HR 72 | Temp 97.5°F | Ht 63.0 in | Wt 173.4 lb

## 2020-11-21 DIAGNOSIS — Z124 Encounter for screening for malignant neoplasm of cervix: Secondary | ICD-10-CM | POA: Insufficient documentation

## 2020-11-21 DIAGNOSIS — N760 Acute vaginitis: Secondary | ICD-10-CM | POA: Insufficient documentation

## 2020-11-21 DIAGNOSIS — G8929 Other chronic pain: Secondary | ICD-10-CM

## 2020-11-21 DIAGNOSIS — G4700 Insomnia, unspecified: Secondary | ICD-10-CM

## 2020-11-21 DIAGNOSIS — I10 Essential (primary) hypertension: Secondary | ICD-10-CM | POA: Diagnosis not present

## 2020-11-21 DIAGNOSIS — Z0000 Encounter for general adult medical examination without abnormal findings: Secondary | ICD-10-CM | POA: Diagnosis not present

## 2020-11-21 DIAGNOSIS — F32A Depression, unspecified: Secondary | ICD-10-CM

## 2020-11-21 DIAGNOSIS — M25512 Pain in left shoulder: Secondary | ICD-10-CM

## 2020-11-21 DIAGNOSIS — F419 Anxiety disorder, unspecified: Secondary | ICD-10-CM

## 2020-11-21 DIAGNOSIS — J309 Allergic rhinitis, unspecified: Secondary | ICD-10-CM

## 2020-11-21 DIAGNOSIS — R319 Hematuria, unspecified: Secondary | ICD-10-CM

## 2020-11-21 MED ORDER — MONTELUKAST SODIUM 10 MG PO TABS: 10.0000 mg | ORAL_TABLET | Freq: Every day | ORAL | 3 refills | Status: DC

## 2020-11-21 MED ORDER — CETIRIZINE HCL 10 MG PO TABS: 10.0000 mg | ORAL_TABLET | Freq: Every day | ORAL | 3 refills | Status: DC

## 2020-11-21 MED ORDER — CLONAZEPAM 1 MG PO TABS: ORAL_TABLET | ORAL | 2 refills | Status: DC

## 2020-11-21 MED ORDER — AMLODIPINE BESYLATE 5 MG PO TABS: 5.0000 mg | ORAL_TABLET | Freq: Every day | ORAL | 3 refills | Status: DC

## 2020-11-21 MED ORDER — BUPROPION HCL ER (XL) 150 MG PO TB24
150.0000 mg | ORAL_TABLET | Freq: Every day | ORAL | 3 refills | Status: DC
Start: 2020-11-21 — End: 2021-10-25

## 2020-11-21 NOTE — Patient Instructions (Addendum)
[facility] [place]  [place] [phone]   [facility] [place]  [address]  [place] [address]  [phone]   Please call and schedule mammogram  Increase norvasc to 5 mg daily total   Cholesterol Content in Foods Cholesterol is a waxy, fat-like substance that helps to carry fat in the blood. The body needs cholesterol in small amounts, but too much cholesterol can cause damage to the arteries and heart. Most people should eat less than 200 milligrams (mg) of cholesterol a day. Foods with cholesterol Cholesterol is found in animal-based foods, such as meat, seafood, and dairy. Generally, low-fat dairy and lean meats have less cholesterol than full-fat dairy and fatty meats. The milligrams of cholesterol per serving (mg per serving) of common cholesterol-containing foods are listed below. Meat and other proteins  Egg -- one large whole egg has 186 mg.  Veal shank -- 4 oz has 141 mg.  Lean ground [place] (93% lean) -- 4 oz has 118 mg.  Fat-trimmed lamb loin -- 4 oz has 106 mg.  Lean ground beef (90% lean) -- 4 oz has 100 mg.  Lobster -- 3.5 oz has 90 mg.  Pork loin chops -- 4 oz has 86 mg.  Canned salmon -- 3.5 oz has 83 mg.  Fat-trimmed beef top loin -- 4 oz has 78 mg.  Frankfurter -- 1 frank (3.5 oz) has 77 mg.  Crab -- 3.5 oz has 71 mg.  Roasted chicken without skin, white meat -- 4 oz has 66 mg.  Light bologna -- 2 oz has 45 mg.  Deli-cut [place] -- 2 oz has 31 mg.  Canned tuna -- 3.5 oz has 31 mg.  Berniece Salines -- 1 oz has 29 mg.  Oysters and mussels (raw) -- 3.5 oz has 25 mg.  Mackerel -- 1 oz has 22 mg.  Trout -- 1 oz has 20 mg.  Pork sausage -- 1 link (1 oz) has 17 mg.  Salmon -- 1 oz has 16 mg.  Tilapia -- 1 oz has 14 mg. Dairy  Soft-serve ice cream --  cup (4 oz) has 103 mg.  Whole-milk yogurt -- 1 cup (8 oz) has 29 mg.  Cheddar cheese  -- 1 oz has 28 mg.  American cheese -- 1 oz has 28 mg.  Whole milk -- 1 cup (8 oz) has 23 mg.  2% milk -- 1 cup (8 oz) has 18 mg.  Cream cheese -- 1 tablespoon (Tbsp) has 15 mg.  Cottage cheese --  cup (4 oz) has 14 mg.  Low-fat (1%) milk -- 1 cup (8 oz) has 10 mg.  Sour cream -- 1 Tbsp has 8.5 mg.  Low-fat yogurt -- 1 cup (8 oz) has 8 mg.  Nonfat Greek yogurt -- 1 cup (8 oz) has 7 mg.  Half-and-half cream -- 1 Tbsp has 5 mg. Fats and oils  Cod liver oil -- 1 tablespoon (Tbsp) has 82 mg.  Butter -- 1 Tbsp has 15 mg.  Lard -- 1 Tbsp has 14 mg.  Bacon grease -- 1 Tbsp has 14 mg.  Mayonnaise -- 1 Tbsp has 5-10 mg.  Margarine -- 1 Tbsp has 3-10 mg. Exact amounts of cholesterol in these foods may vary depending on specific ingredients and brands.   Foods without cholesterol Most plant-based foods do not have cholesterol unless you combine them with a food that has cholesterol. Foods without cholesterol include:  Grains and cereals.  Vegetables.  Fruits.  Vegetable oils, such as olive, canola, and sunflower oil.  Legumes, such as peas, beans, and lentils.  Nuts and seeds.  Egg whites.   Summary  The body needs cholesterol in small amounts, but too much cholesterol can cause damage to the arteries and heart.  Most people should eat less than 200 milligrams (mg) of cholesterol a day. This information is not intended to replace advice given to you by your health care provider. Make sure you discuss any questions you have with your health care provider. Document Revised: 10/25/2019 Document Reviewed: 10/25/2019 Elsevier Patient Education  2021 [place].  High Cholesterol  High cholesterol is a condition in which the blood has high levels of a white, waxy substance similar to fat (cholesterol). The liver makes all the cholesterol that the body needs. The human body needs small amounts of cholesterol to help build cells. A person gets extra or excess  cholesterol from the food that he or she eats. The blood carries cholesterol from the liver to the rest of the body. If you have high cholesterol, deposits (plaques) may build up on the walls of your arteries. Arteries are the blood vessels that carry blood away from your heart. These plaques make the arteries narrow and stiff. Cholesterol plaques increase your risk for heart attack and stroke. Work with your health care provider to keep your cholesterol levels in a healthy range. What increases the risk? The following factors may make you more likely to develop this condition:  Eating foods that are high in animal fat (saturated fat) or cholesterol.  Being overweight.  Not getting enough exercise.  A family history of high cholesterol (familial hypercholesterolemia).  Use of tobacco products.  Having diabetes. What are the signs or symptoms? There are no symptoms of this condition. How is this diagnosed? This condition may be diagnosed based on the results of a blood test.  If you are older than 57 years of age, your health care provider may check your cholesterol levels every 4-6 years.  You may be checked more often if you have high cholesterol or other risk factors for heart disease. The blood test for cholesterol measures:  "Bad" cholesterol, or LDL cholesterol. This is the main type of cholesterol that causes heart disease. The desired level is less than 100 mg/dL.  "Good" cholesterol, or HDL cholesterol. HDL helps protect against heart disease by cleaning the arteries and carrying the LDL to the liver for processing. The desired level for HDL is 60 mg/dL or higher.  Triglycerides. These are fats that your body can store or burn for energy. The desired level is less than 150 mg/dL.  Total cholesterol. This measures the total amount of cholesterol in your blood and includes LDL, HDL, and triglycerides. The desired level is less than 200 mg/dL. How is this treated? This  condition may be treated with:  Diet changes. You may be asked to eat foods that have more fiber and less saturated fats or added sugar.  Lifestyle changes. These may include regular exercise, maintaining a healthy weight, and quitting use of tobacco products.  Medicines. These are given when diet and lifestyle changes have not worked. You may be prescribed a statin medicine to help lower your cholesterol levels. Follow these instructions at home: Eating and drinking  Eat a healthy, balanced diet. This diet includes: ? Daily servings of a variety of fresh, frozen, or canned fruits and vegetables. ? Daily servings of whole  grain foods that are rich in fiber. ? Foods that are low in saturated fats and trans fats. These include poultry and fish without skin, lean cuts of meat, and low-fat dairy products. ? A variety of fish, especially oily fish that contain omega-3 fatty acids. Aim to eat fish at least 2 times a week.  Avoid foods and drinks that have added sugar.  Use healthy cooking methods, such as roasting, grilling, broiling, baking, poaching, steaming, and stir-frying. Do not fry your food except for stir-frying.   Lifestyle  Get regular exercise. Aim to exercise for a total of 150 minutes a week. Increase your activity level by doing activities such as gardening, walking, and taking the stairs.  Do not use any products that contain nicotine or tobacco, such as cigarettes, e-cigarettes, and chewing tobacco. If you need help quitting, ask your health care provider.   General instructions  Take over-the-counter and prescription medicines only as told by your health care provider.  Keep all follow-up visits as told by your health care provider. This is important. Where to find more information  American Heart Association: www.heart.org  National Heart, Lung, and Blood Institute: https://wilson-eaton.com/ Contact a health care provider if:  You have trouble achieving or maintaining a  healthy diet or weight.  You are starting an exercise program.  You are unable to stop smoking. Get help right away if:  You have chest pain.  You have trouble breathing.  You have any symptoms of a stroke. "BE FAST" is an easy way to remember the main warning signs of a stroke: ? B - Balance. Signs are dizziness, sudden trouble walking, or loss of balance. ? E - Eyes. Signs are trouble seeing or a sudden change in vision. ? F - Face. Signs are sudden weakness or numbness of the face, or the face or eyelid drooping on one side. ? A - Arms. Signs are weakness or numbness in an arm. This happens suddenly and usually on one side of the body. ? S - Speech. Signs are sudden trouble speaking, slurred speech, or trouble understanding what people say. ? T - Time. Time to call emergency services. Write down what time symptoms started.  You have other signs of a stroke, such as: ? A sudden, severe headache with no known cause. ? Nausea or vomiting. ? Seizure. These symptoms may represent a serious problem that is an emergency. Do not wait to see if the symptoms will go away. Get medical help right away. Call your local emergency services (911 in the [place] Do not drive yourself to the hospital. Summary  Cholesterol plaques increase your risk for heart attack and stroke. Work with your health care provider to keep your cholesterol levels in a healthy range.  Eat a healthy, balanced diet, get regular exercise, and maintain a healthy weight.  Do not use any products that contain nicotine or tobacco, such as cigarettes, e-cigarettes, and chewing tobacco.  Get help right away if you have any symptoms of a stroke. This information is not intended to replace advice given to you by your health care provider. Make sure you discuss any questions you have with your health care provider. Document Revised: 05/03/2019 Document Reviewed: 05/03/2019 Elsevier Patient Education  2021 [employer].

## 2020-11-21 NOTE — Progress Notes (Signed)
Chief Complaint  Patient presents with  . Follow-up  . Gynecologic Exam   Annual with pap  1. htn uncontrolled norvasc 2.5 mg qd on micardis 80 mg qd  2. Hypothyroidism f/u [facility] t[facility] uncontrolled  3. Left shoulder pain had steroid x 1 Dr. Sabra Heck will f/[facility]   Review of Systems  Constitutional: Negative for weight loss.  HENT: Negative for hearing loss.   Eyes: Negative for blurred vision.  Respiratory: Negative for shortness of breath.   Cardiovascular: Negative for chest pain.  Gastrointestinal: Negative for abdominal pain.  Musculoskeletal: Positive for joint pain.  Skin: Negative for rash.  Neurological: Negative for headaches.  Psychiatric/Behavioral: The patient is not nervous/anxious.    Past Medical History:  Diagnosis Date  . Anxiety   . Arthritis   . Asthma   . Barrett's esophagus 2015  . COVID-19    03/10/20  . Depression   . GERD (gastroesophageal reflux disease)   . Hypertension   . Hypothyroidism   . Pneumonia   . Thyroid disease    Past Surgical History:  Procedure Laterality Date  . ABLATION  2006  . BREAST CYST ASPIRATION Left 1998  . BREAST SURGERY     cyst aspiration  . BREAST SURGERY  1998   cyst aspiration  . COLONOSCOPY  03/2014  . COLONOSCOPY WITH PROPOFOL N/A 01/06/2020   Procedure: COLONOSCOPY WITH PROPOFOL;  Surgeon: Toledo, Benay Pike, MD;  Location: [facility] ENDOSCOPY;  Service: Gastroenterology;  Laterality: N/A;  . ENDOMETRIAL ABLATION    . ESOPHAGOGASTRODUODENOSCOPY (EGD) WITH PROPOFOL N/A 08/15/2015   Procedure: ESOPHAGOGASTRODUODENOSCOPY (EGD) WITH PROPOFOL;  Surgeon: Lollie Sails, MD;  Location: [facility] ENDOSCOPY;  Service: Endoscopy;  Laterality: N/A;  . ESOPHAGOGASTRODUODENOSCOPY (EGD) WITH PROPOFOL N/A 01/22/2016   Procedure: ESOPHAGOGASTRODUODENOSCOPY (EGD) WITH PROPOFOL;  Surgeon: Lollie Sails, MD;  Location: [facility] ENDOSCOPY;  Service: Endoscopy;  Laterality: N/A;  . ESOPHAGOGASTRODUODENOSCOPY (EGD) WITH  PROPOFOL N/A 01/06/2020   Procedure: ESOPHAGOGASTRODUODENOSCOPY (EGD) WITH PROPOFOL;  Surgeon: Toledo, Benay Pike, MD;  Location: [facility] ENDOSCOPY;  Service: Gastroenterology;  Laterality: N/A;  . GASTRIC BYPASS  2005  . HERNIA REPAIR    . NASAL SINUS SURGERY    . 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 TOTAL THYROIDECTOMY    . TUBAL LIGATION     Family History  Problem Relation Age of Onset  . Heart disease Mother   . COPD Mother   . Arthritis Mother   . Cancer Mother        uterine  . Lupus Mother   . Anxiety disorder Mother   . Depression Mother   . Drug abuse Mother   . COPD Father   . Arthritis Father   . Heart disease Father   . Heart attack Father   . Alcohol abuse Father   . Heart disease Brother   . Heart attack Brother   . Drug abuse Brother   . Anxiety disorder Brother   . Depression Brother   . Heart disease Brother   . Cancer Brother        liver cancer age 87 y.o  . Diabetes Maternal Grandmother   . Diabetes Maternal Grandfather   . Diabetes Paternal Grandmother   . Diabetes Paternal Grandfather    Social History   Socioeconomic History  . Marital status: Divorced    Spouse name: Not on file  . Number of children: 2  . Years of education: Not on file  .  Highest education level: High school graduate  Occupational History  . Not on file  Tobacco Use  . Smoking status: Current Every Day Smoker    Packs/day: 1.00    Years: 30.00    Pack years: 30.00    Types: Cigarettes    Start date: 03/15/1980  . Smokeless tobacco: Never Used  Vaping Use  . Vaping Use: Never used  Substance and Sexual Activity  . Alcohol use: Yes    Alcohol/week: 10.0 - 12.0 standard drinks    Types: 8 - 10 Glasses of wine, 2 Standard drinks or equivalent per week  . Drug use: No  . Sexual activity: Yes    Partners: Male  Other Topics Concern  . 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 albuterol (VENTOLIN HFA) 108 (90 Base) MCG/ACT inhaler Inhale 2 puffs into the lungs every 6 (six) hours as needed for wheezing or shortness of breath.  . budesonide-formoterol (SYMBICORT) 160-4.5 MCG/ACT inhaler Inhale 2 puffs into the lungs 2 (two) times daily. Rinse mouth  . Cholecalciferol 1.25 MG (50000 UT) capsule Take 1 capsule (50,000 Units total) by mouth once a week.  . cyanocobalamin (,VITAMIN B-12,) 1000 MCG/ML injection Inject 1 mL (1,000 mcg total) into the muscle every 30 (thirty) days.  . methocarbamol (ROBAXIN) 500 MG tablet Take 500 mg by mouth 2 (two) times daily.  Marland Kitchen NEEDLE, DISP, 25 G 25G X 1-1/2" MISC 1 Device by Does not apply route every 30 (thirty) days.  . pantoprazole (PROTONIX) 40 MG tablet TAKE 1 TABLET(40 MG) BY MOUTH DAILY IN THE MORNING 30 MINUTES BEFORE FOOD  . SYNTHROID 200 MCG tablet TAKE 1 TABLET BY MOUTH ONCE daily before breakfast 30 minutes  Further refills from [facility] clinic endocrine Dr. Honor Junes, Marcello Moores no exceptions. Call to make appt endocrine  . telmisartan (MICARDIS) 80 MG tablet 1/2 pill x 4 days in the am and day 5 1 whole pill if BP >130/>80 goal BP <130/<80. D/c lisinopril 20-hctz 12.5 2 pills daily  . venlafaxine XR (EFFEXOR-XR) 150 MG 24 hr capsule Take 1 capsule (150 mg total) by mouth daily with breakfast.  . [DISCONTINUED] amLODipine (NORVASC) 2.5 MG tablet Take 1 tablet (2.5 mg total) by mouth daily.  . [DISCONTINUED] buPROPion (WELLBUTRIN XL) 150 MG 24 hr tablet Take 1 tablet (150 mg total) by mouth daily.  . [DISCONTINUED] cetirizine (ZYRTEC) 10 MG tablet Take 1 tablet  (10 mg total) by mouth daily.  . [DISCONTINUED] clonazePAM (KLONOPIN) 1 MG tablet TAKE 1 TABLET(1 MG) BY MOUTH TWICE DAILY AS NEEDED FOR ANXIETY  . [DISCONTINUED] montelukast (SINGULAIR) 10 MG tablet Take 1 tablet (10 mg total) by mouth at bedtime.   Allergies  Allergen Reactions  . Hctz [Hydrochlorothiazide]     Hyponatremia    Recent Results (from the past 2160 hour(s))  Protein Electro, Random Urine     Status: Abnormal   Collection Time: 09/18/20  2:46  PM  Result Value Ref Range   Total Protein, Urine 56.7 Not Estab. mg/dL   Albumin ELP, Urine 5.6 %   Alpha-1-Globulin, U 1.3 %   Alpha-2-Globulin, U 5.2 %   Beta Globulin, U 83.8 %   Gamma Globulin, U 4.2 %   M Component, Ur 51.9 (H) Not Observed %   Please Note: Comment     Comment: (NOTE) Protein electrophoresis scan will follow via computer, mail, or courier delivery. Performed At: [facility] [place], [place] [id number] Rush Farmer MD [id number]   Kappa/lambda light chains     Status: Abnormal   Collection Time: 09/18/20  2:46 PM  Result Value Ref Range   Kappa free light chain 13.0 3.3 - 19.4 mg/L   Lamda free light chains 239.7 (H) 5.7 - 26.3 mg/L   Kappa, lamda light chain ratio 0.05 (L) 0.26 - 1.65    Comment: (NOTE) Performed At: [facility] [place], [place] [id number] Rush Farmer MD GY:[id number]   Multiple Myeloma Panel (SPEP&IFE w/QIG)     Status: Abnormal   Collection Time: 09/18/20  2:46 PM  Result Value Ref Range   IgG (Immunoglobin G), Serum 3,859 (H) 586 - 1,602 mg/dL   IgA 27 (L) 87 - 352 mg/dL    Comment: Result confirmed on concentration.   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    Please Note Comment     Comment: (NOTE) Protein electrophoresis scan will follow via computer, mail, or courier delivery. Performed At: [facility] [place], [place] [id number] Rush Farmer MD OY:[id number]   Comprehensive metabolic panel     Status: Abnormal   Collection Time: 09/18/20  2:46 PM  Result Value Ref Range   Sodium 128 (L) 135 - 145 mmol/L   Potassium 4.1 3.5 - 5.1 mmol/L   Chloride 93 (L) 98 - 111 mmol/L   CO2 24 22 - 32 mmol/L   Glucose, Bld 154 (H) 70 - 99 mg/dL    Comment: Glucose reference range applies only to samples taken after fasting for at least 8 hours.   BUN 14 6 - 20 mg/dL   Creatinine, Ser 0.78 0.44 - 1.00 mg/dL   Calcium 10.2 8.9 - 10.3 mg/dL   Total Protein 8.8 (H) 6.5 - 8.1 g/dL   Albumin 3.3 (L) 3.5 - 5.0 g/dL   AST 33 15 - 41 U/L   ALT 22 0 - 44 U/L   Alkaline Phosphatase 46 38 - 126 U/L   Total Bilirubin 0.7 0.3 - 1.2 mg/dL   GFR, Estimated >60 >60 mL/min    Comment: (NOTE) Calculated using the CKD-EPI Creatinine Equation (2021)    Anion gap 11 5 - 15    Comment: Performed at [facility], [address] [place],  [place] [address]  CBC with Differential/Platelet     Status: Abnormal   Collection Time: 09/18/20  2:46 PM  Result Value Ref Range   WBC 6.6 4.0 - 10.5 K/uL   RBC 4.09 3.87 - 5.11 MIL/uL   Hemoglobin 12.5 12.0 - 15.0 g/dL   HCT 35.9 (L) 36.0 - 46.0 %   MCV 87.8 80.0 - 100.0 fL   MCH 30.6 26.0 - 34.0 pg   MCHC 34.8 30.0 - 36.0 g/dL   RDW 15.0 11.5 - 15.5 %   Platelets 251 150 - 400 K/uL   nRBC 0.0 0.0 - 0.2 %   Neutrophils Relative % 66 %   Neutro Abs 4.3 1.7 - 7.7 K/uL   Lymphocytes Relative 25 %   Lymphs Abs 1.7 0.7 - 4.0  K/uL   Monocytes Relative 8 %   Monocytes Absolute 0.5 0.1 - 1.0 K/uL   Eosinophils Relative 0 %   Eosinophils Absolute 0.0 0.0 - 0.5 K/uL   Basophils Relative 1 %   Basophils Absolute 0.0 0.0 - 0.1 K/uL   Immature Granulocytes 0 %   Abs Immature Granulocytes 0.02 0.00 - 0.07 K/uL    Comment: Performed at [facility], [address] [place], [place] [address]  Iron, TIBC and Ferritin Panel     Status: Abnormal   Collection Time: 10/27/20  8:31 AM  Result Value Ref Range   Total Iron Binding Capacity 304 250 - 450 ug/dL   UIBC 264 131 - 425 ug/dL   Iron 40 27 - 159 ug/dL   Iron Saturation 13 (L) 15 - 55 %   Ferritin 15 15 - 150 ng/mL  HIV antibody (with reflex)     Status: None   Collection Time: 10/27/20  8:31 AM  Result Value Ref Range   HIV Screen 4th Generation wRfx Non Reactive Non Reactive    Comment: HIV Negative HIV-1/HIV-2 antibodies and HIV-1 p24 antigen were NOT detected. There is no laboratory evidence of HIV infection.   Microalbumin / creatinine urine ratio     Status: Abnormal   Collection Time: 10/27/20  8:31 AM  Result Value Ref Range   Creatinine, Urine 57.2 Not Estab. mg/dL   Microalbumin, Urine 18.1 Not Estab. ug/mL   Microalb/Creat Ratio 32 (H) 0 - 29 mg/g creat    Comment:                        Normal:                0 -  29                        Moderately increased: 30 - 300                        Severely increased:       >300   Sodium, urine, random     Status: None   Collection Time: 10/27/20  8:31 AM  Result Value Ref Range   Sodium, Ur 95 Not Estab. mmol/L  Urinalysis, Routine w reflex microscopic     Status: Abnormal   Collection Time: 10/27/20  8:31 AM  Result Value Ref Range   Specific Gravity, UA 1.012 1.005 - 1.030   pH, UA 6.0 5.0 - 7.5   Color, UA Yellow Yellow   Appearance Ur Cloudy (A) Clear   Leukocytes,UA 3+ (A) Negative   Protein,UA Trace  Negative/Trace   Glucose, UA Negative Negative   Ketones, UA Negative Negative    RBC, UA Negative Negative   Bilirubin, UA Negative Negative   Urobilinogen, Ur 0.2 0.2 - 1.0 mg/dL   Nitrite, UA Positive (A) Negative   Microscopic Examination See below:     Comment: Microscopic was indicated and was performed.  Vitamin D (25 hydroxy)     Status: None   Collection Time: 10/27/20  8:31 AM  Result Value Ref Range   VITD 51.00 30.00 - 100.00 ng/mL  B12     Status: None   Collection Time: 10/27/20  8:31 AM  Result Value Ref Range   Vitamin B-12 435 211 - 911 pg/mL  T4, free     Status: None   Collection Time: 10/27/20  8:31 AM  Result Value Ref Range   Free T4 1.00 0.60 - 1.60 ng/dL    Comment: Specimens from patients who are undergoing biotin therapy and /or ingesting biotin supplements may contain high levels of biotin.  The higher biotin concentration in these specimens interferes with this Free T4 assay.  Specimens that contain high levels  of biotin may cause false high results for this Free T4 assay.  Please interpret results in light of the total clinical presentation of the patient.    CBC w/Diff     Status: Abnormal   Collection Time: 10/27/20  8:31 AM  Result Value Ref Range   WBC 3.4 (L) 4.0 - 10.5 K/uL   RBC 3.91 3.87 - 5.11 Mil/uL   Hemoglobin 12.4 12.0 - 15.0 g/dL   HCT 36.5 36.0 - 46.0 %   MCV 93.3 78.0 - 100.0 fl   MCHC 33.9 30.0 - 36.0 g/dL   RDW 16.1 (H) 11.5 - 15.5 %   Platelets 236.0 150.0 - 400.0 K/uL   Neutrophils Relative % 47.0 43.0 - 77.0 %   Lymphocytes Relative 41.1 12.0 - 46.0 %   Monocytes Relative 9.7 3.0 - 12.0 %   Eosinophils Relative 1.2 0.0 - 5.0 %   Basophils Relative 1.0 0.0 - 3.0 %   Neutro Abs 1.6 1.4 - 7.7 K/uL   Lymphs Abs 1.4 0.7 - 4.0 K/uL   Monocytes Absolute 0.3 0.1 - 1.0 K/uL   Eosinophils Absolute 0.0 0.0 - 0.7 K/uL   Basophils Absolute 0.0 0.0 - 0.1 K/uL  TSH     Status: Abnormal   Collection Time: 10/27/20  8:31 AM  Result Value Ref Range   TSH 28.70 (H) 0.35 - 4.50 uIU/mL  Hemoglobin A1c     Status: None    Collection Time: 10/27/20  8:31 AM  Result Value Ref Range   Hgb A1c MFr Bld 5.6 4.6 - 6.5 %    Comment: Glycemic Control Guidelines for People with Diabetes:Non Diabetic:  <6%Goal of Therapy: <7%Additional Action Suggested:  >8%   Lipid panel     Status: Abnormal   Collection Time: 10/27/20  8:31 AM  Result Value Ref Range   Cholesterol 241 (H) 0 - 200 mg/dL    Comment: ATP III Classification       Desirable:  < 200 mg/dL               Borderline High:  200 - 239 mg/dL          High:  > = 240 mg/dL   Triglycerides 58.0 0.0 - 149.0 mg/dL    Comment: Normal:  <150 mg/dLBorderline High:  150 - 199 mg/dL   HDL 107.60 >  39.00 mg/dL   VLDL 11.6 0.0 - 40.0 mg/dL   LDL Cholesterol 122 (H) 0 - 99 mg/dL   Total CHOL/HDL Ratio 2     Comment:                Men          Women1/2 Average Risk     3.4          3.3Average Risk          5.0          4.42X Average Risk          9.6          7.13X Average Risk          15.0          11.0                       NonHDL 133.31     Comment: NOTE:  Non-HDL goal should be 30 mg/dL higher than patient's LDL goal (i.e. LDL goal of < 70 mg/dL, would have non-HDL goal of < 100 mg/dL)  Comprehensive metabolic panel     Status: Abnormal   Collection Time: 10/27/20  8:31 AM  Result Value Ref Range   Sodium 137 135 - 145 mEq/L   Potassium 4.1 3.5 - 5.1 mEq/L   Chloride 99 96 - 112 mEq/L   CO2 28 19 - 32 mEq/L   Glucose, Bld 72 70 - 99 mg/dL   BUN 11 6 - 23 mg/dL   Creatinine, Ser 0.67 0.40 - 1.20 mg/dL   Total Bilirubin 0.3 0.2 - 1.2 mg/dL   Alkaline Phosphatase 43 39 - 117 U/L   AST 17 0 - 37 U/L   ALT 11 0 - 35 U/L   Total Protein 8.3 6.0 - 8.3 g/dL   Albumin 3.3 (L) 3.5 - 5.2 g/dL   GFR 97.22 >60.00 mL/min    Comment: Calculated using the CKD-EPI Creatinine Equation (2021)   Calcium 10.3 8.4 - 10.5 mg/dL  Microscopic Examination     Status: Abnormal   Collection Time: 10/27/20  8:31 AM   Urine  Result Value Ref Range   WBC, UA >30 (A) 0 - 5 /hpf   RBC  None seen 0 - 2 /hpf   Epithelial Cells (non renal) 0-10 0 - 10 /hpf   Casts None seen None seen /lpf   Bacteria, UA Many (A) None seen/Few   Objective  Body mass index is 30.72 kg/m. Wt Readings from Last 3 Encounters:  11/21/20 173 lb 6.4 oz (78.7 kg)  10/18/20 171 lb (77.6 kg)  06/20/20 193 lb 12.8 oz (87.9 kg)   Temp Readings from Last 3 Encounters:  11/21/20 (!) 97.5 F (36.4 C) (Oral)  06/28/20 97.7 F (36.5 C) (Oral)  06/20/20 97.8 F (36.6 C) (Tympanic)   BP Readings from Last 3 Encounters:  11/21/20 136/78  10/18/20 (!) 148/99  06/28/20 107/61   Pulse Readings from Last 3 Encounters:  11/21/20 72  10/18/20 77  06/28/20 75    Physical Exam Vitals and nursing note reviewed. Exam conducted with a chaperone present.  Constitutional:      Appearance: Normal appearance. She is well-developed and well-groomed. She is obese.  HENT:     Head: Normocephalic and atraumatic.  Eyes:     Conjunctiva/sclera: Conjunctivae normal.     Pupils: Pupils are equal, round, and reactive to light.  Cardiovascular:  Rate and Rhythm: Normal rate and regular rhythm.     Heart sounds: Normal heart sounds. No murmur heard.   Pulmonary:     Effort: Pulmonary effort is normal.     Breath sounds: Normal breath sounds.  Chest:     Chest wall: No mass.  Breasts: Breasts are symmetrical.     Right: Normal. No axillary adenopathy.     Left: Normal. No axillary adenopathy.    Genitourinary:    Pubic Area: No rash.      Labia:        Right: Lesion present. No rash.        Left: Lesion present. No rash.      Vagina: Normal.     Cervix: Normal.     Uterus: Normal.      Adnexa: Right adnexa normal and left adnexa normal.    Lymphadenopathy:     Upper Body:     Right upper body: No axillary adenopathy.     Left upper body: No axillary adenopathy.  Skin:    General: Skin is warm and dry.  Neurological:     General: No focal deficit present.     Mental Status: She is alert  and oriented to person, place, and time. Mental status is at baseline.     Gait: Gait normal.  Psychiatric:        Attention and Perception: Attention and perception normal.        Mood and Affect: Mood normal.        Speech: Speech normal.        Behavior: Behavior normal. Behavior is cooperative.        Thought Content: Thought content normal.        Cognition and Memory: Cognition and memory normal.        Judgment: Judgment normal.     Assessment  Plan  Annual physical exam  HypertFasting labs 10/27/20 Flu shotutd pna 23 had 02/02/20 Consider prevnar   covid shot had 3/3 pfizer covid 03/10/20 +consider 4th dose  Consider shingrix Tdap UTD10/25/13 rec hepBvaccinefor future(consider new x 2 doses) Consider MMR in future  mammo2/10/21neg, ordered  Papneg 11/20/17 no HPVwill due pap at f/u 11/21/20  EGD/colonoscopy had [facility] , need to get report colonoscopy 10/10/15[facility] Also EGD due 01/22/2019 barretts last 01/21/14 no dysplasia [facility]Dr. ToledoEGD/Colonoscopy barretts neg bx f/u in 3 years egd and 5 years colonoscopy   Skin8/18/21no issues  rec smoking cesasationand exercise to lose with healthy diet choiceswaist 46.5 in today  F/u [facility] End[facility] Dr. Eldridge Scot get on wegovyvs saxenda pt appt 12/11/20  ension, unspecified type - Plan: amLODipine (NORVASC) 5 MG tablet micardis 80 mg qd  Monitor BP   Routine cervical smear - Plan: Cytology - PAP( [place])  Acute vaginitis - Plan: Cervicovaginal ancillary only( [place])  Hematuria, unspecified type - Plan: Urine Culture  Anxiety and depression/insomnia see below - Plan: buPROPion (WELLBUTRIN XL) 150 MG 24 hr tablet Call [facility]  Allergic rhinitis, unspecified seasonality, unspecified trigger - Plan: cetirizine (ZYRTEC) 10 MG tablet, montelukast (SINGULAIR) 10 MG tablet   Insomnia, unspecified type - Plan: clonazePAM (KLONOPIN) 1 MG tablet  Chronic left  shoulder pain  [facility]    Provider: Dr. Olivia Mackie McLean-Scocuzza-Internal Medicine

## 2020-11-22 LAB — CERVICOVAGINAL ANCILLARY ONLY
Bacterial Vaginitis (gardnerella): NEGATIVE
Candida Glabrata: NEGATIVE
Candida Vaginitis: NEGATIVE
Chlamydia: NEGATIVE
Comment: NEGATIVE
Comment: NEGATIVE
Comment: NEGATIVE
Comment: NEGATIVE
Comment: NEGATIVE
Comment: NORMAL
Neisseria Gonorrhea: NEGATIVE
Trichomonas: NEGATIVE

## 2020-11-22 LAB — CYTOLOGY - PAP
Comment: NEGATIVE
Diagnosis: NEGATIVE
High risk HPV: NEGATIVE

## 2020-11-23 ENCOUNTER — Telehealth: Payer: Self-pay

## 2020-11-23 NOTE — Telephone Encounter (Signed)
LMTCB for labs. 

## 2020-11-30 ENCOUNTER — Ambulatory Visit: Payer: Managed Care, Other (non HMO) | Admitting: Oncology

## 2020-11-30 ENCOUNTER — Other Ambulatory Visit: Payer: Managed Care, Other (non HMO)

## 2020-12-11 ENCOUNTER — Inpatient Hospital Stay: Payer: Managed Care, Other (non HMO) | Attending: Oncology

## 2020-12-11 ENCOUNTER — Inpatient Hospital Stay (HOSPITAL_BASED_OUTPATIENT_CLINIC_OR_DEPARTMENT_OTHER): Payer: Managed Care, Other (non HMO) | Admitting: Oncology

## 2020-12-11 ENCOUNTER — Encounter: Payer: Self-pay | Admitting: Oncology

## 2020-12-11 VITALS — BP 133/82 | HR 80 | Temp 97.6°F | Resp 18 | Wt 169.0 lb

## 2020-12-11 DIAGNOSIS — D472 Monoclonal gammopathy: Secondary | ICD-10-CM

## 2020-12-11 DIAGNOSIS — C9000 Multiple myeloma not having achieved remission: Secondary | ICD-10-CM | POA: Insufficient documentation

## 2020-12-11 DIAGNOSIS — F17210 Nicotine dependence, cigarettes, uncomplicated: Secondary | ICD-10-CM | POA: Insufficient documentation

## 2020-12-11 DIAGNOSIS — R634 Abnormal weight loss: Secondary | ICD-10-CM | POA: Diagnosis not present

## 2020-12-11 DIAGNOSIS — M25512 Pain in left shoulder: Secondary | ICD-10-CM | POA: Diagnosis not present

## 2020-12-11 LAB — CBC WITH DIFFERENTIAL/PLATELET
Abs Immature Granulocytes: 0.02 10*3/uL (ref 0.00–0.07)
Basophils Absolute: 0 10*3/uL (ref 0.0–0.1)
Basophils Relative: 0 %
Eosinophils Absolute: 0 10*3/uL (ref 0.0–0.5)
Eosinophils Relative: 0 %
HCT: 38.4 % (ref 36.0–46.0)
Hemoglobin: 13.1 g/dL (ref 12.0–15.0)
Immature Granulocytes: 0 %
Lymphocytes Relative: 31 %
Lymphs Abs: 2 10*3/uL (ref 0.7–4.0)
MCH: 31.1 pg (ref 26.0–34.0)
MCHC: 34.1 g/dL (ref 30.0–36.0)
MCV: 91.2 fL (ref 80.0–100.0)
Monocytes Absolute: 0.6 10*3/uL (ref 0.1–1.0)
Monocytes Relative: 8 %
Neutro Abs: 4 10*3/uL (ref 1.7–7.7)
Neutrophils Relative %: 61 %
Platelets: 280 10*3/uL (ref 150–400)
RBC: 4.21 MIL/uL (ref 3.87–5.11)
RDW: 14.5 % (ref 11.5–15.5)
WBC: 6.6 10*3/uL (ref 4.0–10.5)
nRBC: 0 % (ref 0.0–0.2)

## 2020-12-11 LAB — COMPREHENSIVE METABOLIC PANEL
ALT: 16 U/L (ref 0–44)
AST: 21 U/L (ref 15–41)
Albumin: 3.1 g/dL — ABNORMAL LOW (ref 3.5–5.0)
Alkaline Phosphatase: 47 U/L (ref 38–126)
Anion gap: 9 (ref 5–15)
BUN: 13 mg/dL (ref 6–20)
CO2: 27 mmol/L (ref 22–32)
Calcium: 10 mg/dL (ref 8.9–10.3)
Chloride: 93 mmol/L — ABNORMAL LOW (ref 98–111)
Creatinine, Ser: 0.81 mg/dL (ref 0.44–1.00)
GFR, Estimated: >60 mL/min (ref >60)
Glucose, Bld: 139 mg/dL — ABNORMAL HIGH (ref 70–99)
Potassium: 3.9 mmol/L (ref 3.5–5.1)
Sodium: 129 mmol/L — ABNORMAL LOW (ref 135–145)
Total Bilirubin: 0.7 mg/dL (ref 0.3–1.2)
Total Protein: 8.3 g/dL — ABNORMAL HIGH (ref 6.5–8.1)

## 2020-12-12 LAB — KAPPA/LAMBDA LIGHT CHAINS
Kappa free light chain: 12.7 mg/L (ref 3.3–19.4)
Kappa, lambda light chain ratio: 0.04 — ABNORMAL LOW (ref 0.26–1.65)
Lambda free light chains: 287.5 mg/L — ABNORMAL HIGH (ref 5.7–26.3)

## 2020-12-13 LAB — MULTIPLE MYELOMA PANEL, SERUM
Albumin SerPl Elph-Mcnc: 3.5 g/dL (ref 2.9–4.4)
Albumin/Glob SerPl: 0.8 (ref 0.7–1.7)
Alpha 1: 0.2 g/dL (ref 0.0–0.4)
Alpha2 Glob SerPl Elph-Mcnc: 0.6 g/dL (ref 0.4–1.0)
B-Globulin SerPl Elph-Mcnc: 3.5 g/dL — ABNORMAL HIGH (ref 0.7–1.3)
Gamma Glob SerPl Elph-Mcnc: 0.2 g/dL — ABNORMAL LOW (ref 0.4–1.8)
Globulin, Total: 4.5 g/dL — ABNORMAL HIGH (ref 2.2–3.9)
IgA: 25 mg/dL — ABNORMAL LOW (ref 87–352)
IgG (Immunoglobin G), Serum: 3357 mg/dL — ABNORMAL HIGH (ref 586–1602)
IgM (Immunoglobulin M), Srm: 49 mg/dL (ref 26–217)
M Protein SerPl Elph-Mcnc: 3.1 g/dL — ABNORMAL HIGH
Total Protein ELP: 8 g/dL (ref 6.0–8.5)

## 2020-12-13 LAB — PROTEIN ELECTRO, RANDOM URINE
Albumin ELP, Urine: 3.8 %
Alpha-1-Globulin, U: 0.4 %
Alpha-2-Globulin, U: 1.4 %
Beta Globulin, U: 92.6 %
Gamma Globulin, U: 1.7 %
M Component, Ur: 68.6 % — ABNORMAL HIGH
Total Protein, Urine: 55.5 mg/dL

## 2020-12-14 NOTE — Progress Notes (Signed)
Hematology/Oncology Consult note [facility]  Telephone:([phone] Fax:([phone]  Patient Care Team: McLean-Scocuzza, Nino Glow, MD as PCP - General (Internal Medicine) Bary Castilla, Forest Gleason, MD (General Surgery) Jackolyn Confer, MD (Internal Medicine)   Name of the patient: Paula Garrett  [id number]  1965/08/07   Date of visit: 12/14/20  Diagnosis- smoldering multiple myeloma  Chief complaint/ Reason for visit-routine follow-up of smoldering multiple myeloma  Heme/Onc history: Patient is a 55 year old female with a past medical history significant for vitamin D deficiency B12 deficiency hypothyroidism and history of gastric bypass.  She was recently seen by Dr. Janit Pagan from endocrinology for her hypothyroidism.  She was found to have a mildly elevated calcium and therefore an SPEP was checked.  SPEP and urine showed IgG lambda light chains.  SPEP showed 2.2 g of M protein serum calcium was mildly elevated at 10.7.  Serum creatinine normal at 0.8.   She is actively trying to lose weight and is on [employer].  She is also taking B12 injections.  Reports mild fatigue but denies other complaints.  Denies any back pain but reports occasional bilateral leg pain   Results of myeloma work-up from 06/20/2020 showed an elevated IgG of 3668.  M protein was elevated at 3.1 and IFE showed IgG monoclonal lambda protein.  Beta-2 microglobulin and LDH were normal.  Serum free light chain ratio was elevated at 25 with a free light chain lambda of 303.  Bone marrow biopsy showed increased plasma cells 22% by manual count and 20% by IHC staining for CD138.  Cytogenetics for myeloma showed gain of 1 q.  PET CT scan was not approved by insurance.  MRI cervical and lumbar thoracic spine did not show any evidence of lytic lesions.  Bone survey was negative for bone lesions as well.  MRI pelvis with and without contrast showed diffuse patchy heterogeneous marrow concerning for  myeloma.    Interval history-patient is doing well so far denies any new complaints today.  Appetite and weight have remained stable.She does have some ongoing left shoulder pain for which she had x-rays done which were unremarkable.  She is following up with [facility] for this.  She has lost considerable weight over the last 5 months and down to 169 pounds from a prior weight of 193 pounds.  ECOG PS- 1 Pain scale- 0   Review of systems- Review of Systems  Constitutional:  Positive for malaise/fatigue. Negative for chills, fever and weight loss.  HENT:  Negative for congestion, ear discharge and nosebleeds.   Eyes:  Negative for blurred vision.  Respiratory:  Negative for cough, hemoptysis, sputum production, shortness of breath and wheezing.   Cardiovascular:  Negative for chest pain, palpitations, orthopnea and claudication.  Gastrointestinal:  Negative for abdominal pain, blood in stool, constipation, diarrhea, heartburn, melena, nausea and vomiting.  Genitourinary:  Negative for dysuria, flank pain, frequency, hematuria and urgency.  Musculoskeletal:  Negative for back pain, joint pain and myalgias.  Skin:  Negative for rash.  Neurological:  Negative for dizziness, tingling, focal weakness, seizures, weakness and headaches.  Endo/Heme/Allergies:  Does not bruise/bleed easily.  Psychiatric/Behavioral:  Negative for depression and suicidal ideas. The patient does not have insomnia.       Allergies  Allergen Reactions   Hctz [Hydrochlorothiazide]     Hyponatremia      Past Medical History:  Diagnosis Date   Anxiety    Arthritis    Asthma    Barrett's esophagus  2015   COVID-19    03/10/20   Depression    GERD (gastroesophageal reflux disease)    Hypertension    Hypothyroidism    Pneumonia    Thyroid disease      Past Surgical History:  Procedure Laterality Date   ABLATION  2006   BREAST CYST ASPIRATION Left 1998   BREAST SURGERY     cyst aspiration   BREAST  SURGERY  1998   cyst aspiration   COLONOSCOPY  03/2014   COLONOSCOPY WITH PROPOFOL N/A 01/06/2020   Procedure: COLONOSCOPY WITH PROPOFOL;  Surgeon: Toledo, Benay Pike, MD;  Location: [facility] ENDOSCOPY;  Service: Gastroenterology;  Laterality: N/A;   ENDOMETRIAL ABLATION     ESOPHAGOGASTRODUODENOSCOPY (EGD) WITH PROPOFOL N/A 08/15/2015   Procedure: ESOPHAGOGASTRODUODENOSCOPY (EGD) WITH PROPOFOL;  Surgeon: Lollie Sails, MD;  Location: [facility] ENDOSCOPY;  Service: Endoscopy;  Laterality: N/A;   ESOPHAGOGASTRODUODENOSCOPY (EGD) WITH PROPOFOL N/A 01/22/2016   Procedure: ESOPHAGOGASTRODUODENOSCOPY (EGD) WITH PROPOFOL;  Surgeon: Lollie Sails, MD;  Location: [facility] ENDOSCOPY;  Service: Endoscopy;  Laterality: N/A;   ESOPHAGOGASTRODUODENOSCOPY (EGD) WITH PROPOFOL N/A 01/06/2020   Procedure: ESOPHAGOGASTRODUODENOSCOPY (EGD) WITH PROPOFOL;  Surgeon: Toledo, Benay Pike, MD;  Location: [facility] ENDOSCOPY;  Service: Gastroenterology;  Laterality: N/A;   GASTRIC BYPASS  2005   HERNIA REPAIR     NASAL SINUS SURGERY     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   TOTAL THYROIDECTOMY     TUBAL LIGATION      Social History   Socioeconomic History   Marital status: Divorced    Spouse name: Not on file   Number of children: 2   Years of education: Not on file   Highest education level: High school graduate  Occupational History   Not on file  Tobacco Use   Smoking status: Every Day    Packs/day: 1.00    Years: 30.00    Pack years: 30.00    Types: Cigarettes    Start date: 03/15/1980   Smokeless tobacco: Never  Vaping Use   Vaping Use: Never used  Substance and Sexual Activity   Alcohol use: Yes    Alcohol/week: 10.0 - 12.0 standard drinks    Types: 8 - 10 Glasses of wine, 2 Standard drinks or equivalent per week   Drug use: No   Sexual activity: Yes    Partners: Male  Other Topics Concern   Not on file  Social History Narrative   Lives in [place] single, divorced       Work - [employer] will  be retiring 05/2020       Diet - healthy diet   Exercise - limited   Caffeine use: daily   Social Determinants of Health   Financial Resource Strain: Not on file  Food Insecurity: Not on file  Transportation Needs: Not on file  Physical Activity: Not on file  Stress: Not on file  Social Connections: Not on file  Intimate Partner Violence: Not on file    Family History  Problem Relation Age of Onset   Heart disease Mother    COPD Mother    Arthritis Mother    Cancer Mother        uterine   Lupus Mother    Anxiety disorder Mother    Depression Mother    Drug abuse Mother    COPD Father    Arthritis Father    Heart disease Father    Heart attack Father    Alcohol abuse  Father    Heart disease Brother    Heart attack Brother    Drug abuse Brother    Anxiety disorder Brother    Depression Brother    Heart disease Brother    Cancer Brother        liver cancer age 49 y.o   Diabetes Maternal Grandmother    Diabetes Maternal Grandfather    Diabetes Paternal Grandmother    Diabetes Paternal Grandfather      Current Outpatient Medications:    albuterol (VENTOLIN HFA) 108 (90 Base) MCG/ACT inhaler, Inhale 2 puffs into the lungs every 6 (six) hours as needed for wheezing or shortness of breath., Disp: 18 g, Rfl: 11   amLODipine (NORVASC) 5 MG tablet, Take 1 tablet (5 mg total) by mouth daily., Disp: 90 tablet, Rfl: 3   budesonide-formoterol (SYMBICORT) 160-4.5 MCG/ACT inhaler, Inhale 2 puffs into the lungs 2 (two) times daily. Rinse mouth, Disp: 1 each, Rfl: 11   buPROPion (WELLBUTRIN XL) 150 MG 24 hr tablet, Take 1 tablet (150 mg total) by mouth daily., Disp: 90 tablet, Rfl: 3   cetirizine (ZYRTEC) 10 MG tablet, Take 1 tablet (10 mg total) by mouth daily., Disp: 90 tablet, Rfl: 3   Cholecalciferol 1.25 MG (50000 UT) capsule, Take 1 capsule (50,000 Units total) by mouth once a week., Disp: 13 capsule, Rfl: 3   clonazePAM (KLONOPIN) 1 MG tablet, TAKE 1 TABLET(1 MG) BY MOUTH  TWICE DAILY AS NEEDED FOR ANXIETY, Disp: 60 tablet, Rfl: 2   cyanocobalamin (,VITAMIN B-12,) 1000 MCG/ML injection, Inject 1 mL (1,000 mcg total) into the muscle every 30 (thirty) days., Disp: 4 mL, Rfl: 12   montelukast (SINGULAIR) 10 MG tablet, Take 1 tablet (10 mg total) by mouth at bedtime., Disp: 90 tablet, Rfl: 3   NEEDLE, DISP, 25 G 25G X 1-1/2" MISC, 1 Device by Does not apply route every 30 (thirty) days., Disp: 30 each, Rfl: 2   pantoprazole (PROTONIX) 40 MG tablet, TAKE 1 TABLET(40 MG) BY MOUTH DAILY IN THE MORNING 30 MINUTES BEFORE FOOD, Disp: 90 tablet, Rfl: 3   SYNTHROID 200 MCG tablet, TAKE 1 TABLET BY MOUTH ONCE daily before breakfast 30 minutes  Further refills from [facility] clinic endocrine Dr. Mee Hives no exceptions. Call to make appt endocrine, Disp: 90 tablet, Rfl: 3   telmisartan (MICARDIS) 80 MG tablet, 1/2 pill x 4 days in the am and day 5 1 whole pill if BP >130/>80 goal BP <130/<80. D/c lisinopril 20-hctz 12.5 2 pills daily, Disp: 90 tablet, Rfl: 3   venlafaxine XR (EFFEXOR-XR) 150 MG 24 hr capsule, Take 1 capsule (150 mg total) by mouth daily with breakfast., Disp: 90 capsule, Rfl: 3  Physical exam:  Vitals:   12/11/20 1410  BP: 133/82  Pulse: 80  Resp: 18  Temp: 97.6 F (36.4 C)  SpO2: 95%  Weight: 169 lb (76.7 kg)   Physical Exam Constitutional:      General: She is not in acute distress. Cardiovascular:     Rate and Rhythm: Normal rate and regular rhythm.     Heart sounds: Normal heart sounds.  Pulmonary:     Effort: Pulmonary effort is normal.     Breath sounds: Normal breath sounds.  Abdominal:     General: Bowel sounds are normal.     Palpations: Abdomen is soft.  Skin:    General: Skin is warm and dry.  Neurological:     Mental Status: She is alert and oriented to person, place, and  time.     CMP Latest Ref Rng & Units 12/11/2020  Glucose 70 - 99 mg/dL 139(H)  BUN 6 - 20 mg/dL 13  Creatinine 0.44 - 1.00 mg/dL 0.81  Sodium 135 - 145  mmol/L 129(L)  Potassium 3.5 - 5.1 mmol/L 3.9  Chloride 98 - 111 mmol/L 93(L)  CO2 22 - 32 mmol/L 27  Calcium 8.9 - 10.3 mg/dL 10.0  Total Protein 6.5 - 8.1 g/dL 8.3(H)  Total Bilirubin 0.3 - 1.2 mg/dL 0.7  Alkaline Phos 38 - 126 U/L 47  AST 15 - 41 U/L 21  ALT 0 - 44 U/L 16   CBC Latest Ref Rng & Units 12/11/2020  WBC 4.0 - 10.5 K/uL 6.6  Hemoglobin 12.0 - 15.0 g/dL 13.1  Hematocrit 36.0 - 46.0 % 38.4  Platelets 150 - 400 K/uL 280    No images are attached to the encounter.  No results found.   Assessment and plan- Patient is a 55 y.o. female with history of smoldering multiple myeloma here for routine follow-up  No crab criteria based on labs.  She is not anemic.  Renal functions are normal.  Serum calcium is 10 and has been stable at this range over the last 1 year.  Total protein stable at around 8.3-8.8.  Myeloma panel shows an M protein of 3.1 again stable over the last 6 months.  IgG levels elevated at 3357 but overall stable.  Serum free light chain ratio remains around 25.  No overt indication for progression to myeloma.  No need for repeat bone marrow biopsy at this time.  Repeat CBC CMP myeloma panel and serum free light chains in 3 months in 6 months and I will see her back in 6 months  Patient's ongoing weight loss is concerning.  If she continues to have weight loss I will consider getting CT scans to rule out malignancy   Visit Diagnosis 1. Smoldering multiple myeloma ([place])      Dr. Randa Evens, MD, MPH [facility] at [facility] [phone] 12/14/2020 8:39 AM

## 2020-12-22 ENCOUNTER — Other Ambulatory Visit: Payer: Managed Care, Other (non HMO)

## 2020-12-22 ENCOUNTER — Ambulatory Visit: Payer: Managed Care, Other (non HMO) | Admitting: Oncology

## 2021-03-07 ENCOUNTER — Other Ambulatory Visit: Payer: Self-pay | Admitting: Internal Medicine

## 2021-03-07 DIAGNOSIS — E538 Deficiency of other specified B group vitamins: Secondary | ICD-10-CM

## 2021-03-13 ENCOUNTER — Inpatient Hospital Stay: Payer: Managed Care, Other (non HMO)

## 2021-03-15 ENCOUNTER — Inpatient Hospital Stay: Payer: Managed Care, Other (non HMO) | Attending: Oncology

## 2021-03-15 ENCOUNTER — Other Ambulatory Visit: Payer: Self-pay

## 2021-03-15 DIAGNOSIS — C9000 Multiple myeloma not having achieved remission: Secondary | ICD-10-CM | POA: Insufficient documentation

## 2021-03-15 DIAGNOSIS — F17210 Nicotine dependence, cigarettes, uncomplicated: Secondary | ICD-10-CM | POA: Insufficient documentation

## 2021-03-15 DIAGNOSIS — R634 Abnormal weight loss: Secondary | ICD-10-CM | POA: Diagnosis not present

## 2021-03-15 DIAGNOSIS — M25512 Pain in left shoulder: Secondary | ICD-10-CM | POA: Diagnosis not present

## 2021-03-15 DIAGNOSIS — D472 Monoclonal gammopathy: Secondary | ICD-10-CM

## 2021-03-15 LAB — CBC WITH DIFFERENTIAL/PLATELET
Abs Immature Granulocytes: 0.01 10*3/uL (ref 0.00–0.07)
Basophils Absolute: 0 10*3/uL (ref 0.0–0.1)
Basophils Relative: 1 %
Eosinophils Absolute: 0 10*3/uL (ref 0.0–0.5)
Eosinophils Relative: 0 %
HCT: 39.8 % (ref 36.0–46.0)
Hemoglobin: 13.1 g/dL (ref 12.0–15.0)
Immature Granulocytes: 0 %
Lymphocytes Relative: 31 %
Lymphs Abs: 1.5 10*3/uL (ref 0.7–4.0)
MCH: 30.4 pg (ref 26.0–34.0)
MCHC: 32.9 g/dL (ref 30.0–36.0)
MCV: 92.3 fL (ref 80.0–100.0)
Monocytes Absolute: 0.4 10*3/uL (ref 0.1–1.0)
Monocytes Relative: 7 %
Neutro Abs: 3.1 10*3/uL (ref 1.7–7.7)
Neutrophils Relative %: 61 %
Platelets: 310 10*3/uL (ref 150–400)
RBC: 4.31 MIL/uL (ref 3.87–5.11)
RDW: 14.4 % (ref 11.5–15.5)
WBC: 5 10*3/uL (ref 4.0–10.5)
nRBC: 0 % (ref 0.0–0.2)

## 2021-03-16 ENCOUNTER — Other Ambulatory Visit: Payer: Self-pay | Admitting: Internal Medicine

## 2021-03-16 DIAGNOSIS — Z1231 Encounter for screening mammogram for malignant neoplasm of breast: Secondary | ICD-10-CM

## 2021-04-09 ENCOUNTER — Ambulatory Visit
Admission: RE | Admit: 2021-04-09 | Discharge: 2021-04-09 | Disposition: A | Discharge status: Home / Self Care | Payer: Managed Care, Other (non HMO) | Source: Ambulatory Visit | Attending: Internal Medicine | Admitting: Internal Medicine

## 2021-04-09 ENCOUNTER — Other Ambulatory Visit: Payer: Self-pay

## 2021-04-09 DIAGNOSIS — Z1231 Encounter for screening mammogram for malignant neoplasm of breast: Secondary | ICD-10-CM | POA: Insufficient documentation

## 2021-04-20 ENCOUNTER — Other Ambulatory Visit: Payer: Self-pay | Admitting: Internal Medicine

## 2021-04-20 DIAGNOSIS — G4700 Insomnia, unspecified: Secondary | ICD-10-CM

## 2021-04-20 DIAGNOSIS — F419 Anxiety disorder, unspecified: Secondary | ICD-10-CM

## 2021-04-20 NOTE — Telephone Encounter (Signed)
RX Refill: klonopin Last Seen: 11-21-20 Last Ordered: 11-21-20 Next Appt: na

## 2021-05-18 ENCOUNTER — Other Ambulatory Visit: Payer: Self-pay | Admitting: Internal Medicine

## 2021-05-18 DIAGNOSIS — F32A Depression, unspecified: Secondary | ICD-10-CM

## 2021-06-06 ENCOUNTER — Other Ambulatory Visit: Payer: Self-pay | Admitting: *Deleted

## 2021-06-06 DIAGNOSIS — D472 Monoclonal gammopathy: Secondary | ICD-10-CM

## 2021-06-12 ENCOUNTER — Inpatient Hospital Stay (HOSPITAL_BASED_OUTPATIENT_CLINIC_OR_DEPARTMENT_OTHER): Payer: Managed Care, Other (non HMO) | Admitting: Oncology

## 2021-06-12 ENCOUNTER — Other Ambulatory Visit: Payer: Self-pay

## 2021-06-12 ENCOUNTER — Encounter: Payer: Self-pay | Admitting: Oncology

## 2021-06-12 ENCOUNTER — Inpatient Hospital Stay: Payer: Managed Care, Other (non HMO) | Attending: Oncology

## 2021-06-12 VITALS — BP 185/83 | HR 81 | Temp 96.6°F | Resp 20 | Wt 166.4 lb

## 2021-06-12 DIAGNOSIS — C9000 Multiple myeloma not having achieved remission: Secondary | ICD-10-CM | POA: Diagnosis not present

## 2021-06-12 DIAGNOSIS — R61 Generalized hyperhidrosis: Secondary | ICD-10-CM | POA: Insufficient documentation

## 2021-06-12 DIAGNOSIS — D472 Monoclonal gammopathy: Secondary | ICD-10-CM

## 2021-06-12 DIAGNOSIS — F17210 Nicotine dependence, cigarettes, uncomplicated: Secondary | ICD-10-CM | POA: Insufficient documentation

## 2021-06-12 DIAGNOSIS — R634 Abnormal weight loss: Secondary | ICD-10-CM | POA: Diagnosis not present

## 2021-06-12 DIAGNOSIS — R110 Nausea: Secondary | ICD-10-CM

## 2021-06-12 DIAGNOSIS — N951 Menopausal and female climacteric states: Secondary | ICD-10-CM | POA: Insufficient documentation

## 2021-06-12 LAB — COMPREHENSIVE METABOLIC PANEL
ALT: 14 U/L (ref 0–44)
AST: 18 U/L (ref 15–41)
Albumin: 3 g/dL — ABNORMAL LOW (ref 3.5–5.0)
Alkaline Phosphatase: 47 U/L (ref 38–126)
Anion gap: 12 (ref 5–15)
BUN: 11 mg/dL (ref 6–20)
CO2: 25 mmol/L (ref 22–32)
Calcium: 9.9 mg/dL (ref 8.9–10.3)
Chloride: 95 mmol/L — ABNORMAL LOW (ref 98–111)
Creatinine, Ser: 0.69 mg/dL (ref 0.44–1.00)
GFR, Estimated: >60 mL/min (ref >60)
Glucose, Bld: 96 mg/dL (ref 70–99)
Potassium: 3.8 mmol/L (ref 3.5–5.1)
Sodium: 132 mmol/L — ABNORMAL LOW (ref 135–145)
Total Bilirubin: 0.4 mg/dL (ref 0.3–1.2)
Total Protein: 8.8 g/dL — ABNORMAL HIGH (ref 6.5–8.1)

## 2021-06-12 LAB — CBC WITH DIFFERENTIAL/PLATELET
Abs Immature Granulocytes: 0.01 10*3/uL (ref 0.00–0.07)
Basophils Absolute: 0.1 10*3/uL (ref 0.0–0.1)
Basophils Relative: 1 %
Eosinophils Absolute: 0 10*3/uL (ref 0.0–0.5)
Eosinophils Relative: 0 %
HCT: 39.7 % (ref 36.0–46.0)
Hemoglobin: 13.4 g/dL (ref 12.0–15.0)
Immature Granulocytes: 0 %
Lymphocytes Relative: 38 %
Lymphs Abs: 2.9 10*3/uL (ref 0.7–4.0)
MCH: 30 pg (ref 26.0–34.0)
MCHC: 33.8 g/dL (ref 30.0–36.0)
MCV: 89 fL (ref 80.0–100.0)
Monocytes Absolute: 0.6 10*3/uL (ref 0.1–1.0)
Monocytes Relative: 8 %
Neutro Abs: 4.1 10*3/uL (ref 1.7–7.7)
Neutrophils Relative %: 53 %
Platelets: 295 10*3/uL (ref 150–400)
RBC: 4.46 MIL/uL (ref 3.87–5.11)
RDW: 13.8 % (ref 11.5–15.5)
WBC: 7.7 10*3/uL (ref 4.0–10.5)
nRBC: 0 % (ref 0.0–0.2)

## 2021-06-12 MED ORDER — ONDANSETRON HCL 8 MG PO TABS: 8.0000 mg | ORAL_TABLET | Freq: Three times a day (TID) | ORAL | 0 refills | Status: DC | PRN

## 2021-06-12 NOTE — Progress Notes (Signed)
Patient states she is nausea 75% of the time. She can't eat or drink like she use to because of this. Patient is nausea as we speak now.

## 2021-06-12 NOTE — Progress Notes (Signed)
Hematology/Oncology Consult note [facility]  Telephone:([phone] Fax:([phone]  Patient Care Team: McLean-Scocuzza, Nino Glow, MD as PCP - General (Internal Medicine) Bary Castilla, Forest Gleason, MD (General Surgery) Jackolyn Confer, MD (Internal Medicine)   Name of the patient: Paula Garrett  [id number]  1965-12-13   Date of visit: 06/12/21  Diagnosis- smoldering multiple myeloma  Chief complaint/ Reason for visit-routine follow-up of smoldering multiple myeloma  Heme/Onc history:  Patient is a 55 year old female with a past medical history significant for vitamin D deficiency B12 deficiency hypothyroidism and history of gastric bypass.  She was recently seen by Dr. Janit Pagan from endocrinology for her hypothyroidism.  She was found to have a mildly elevated calcium and therefore an SPEP was checked.  SPEP and urine showed IgG lambda light chains.  SPEP showed 2.2 g of M protein serum calcium was mildly elevated at 10.7.  Serum creatinine normal at 0.8.   She is actively trying to lose weight and is on [employer].  She is also taking B12 injections.  Reports mild fatigue but denies other complaints.  Denies any back pain but reports occasional bilateral leg pain   Results of myeloma work-up from 06/20/2020 showed an elevated IgG of 3668.  M protein was elevated at 3.1 and IFE showed IgG monoclonal lambda protein.  Beta-2 microglobulin and LDH were normal.  Serum free light chain ratio was elevated at 25 with a free light chain lambda of 303.  Bone marrow biopsy showed increased plasma cells 22% by manual count and 20% by IHC staining for CD138.  Cytogenetics for myeloma showed gain of 1 q.  PET CT scan was not approved by insurance.  MRI cervical and lumbar thoracic spine did not show any evidence of lytic lesions.  Bone survey was negative for bone lesions as well.  MRI pelvis with and without contrast showed diffuse patchy heterogeneous marrow concerning for  myeloma.  Interval history-patient reports new symptom of nausea for the past 2 months.  States she is 75% of the time nauseated which is preventing her from eating and drinking.  States she has not vomit due to history of gastric bypass.  Denies any abdominal pain.  Reports hot flashes that have now worsened into night sweats.  Wakes up almost nightly and has to change her clothes.  She is curious if she has lost more weight from her visit in June.  Has allover aches and pains but this is a chronic finding.  ECOG PS- 1 Pain scale- 0   Review of systems- Review of Systems  Constitutional:  Positive for weight loss. Negative for chills, fever and malaise/fatigue.  HENT:  Negative for congestion, ear pain and tinnitus.   Eyes: Negative.  Negative for blurred vision and double vision.  Respiratory: Negative.  Negative for cough, sputum production and shortness of breath.   Cardiovascular: Negative.  Negative for chest pain, palpitations and leg swelling.  Gastrointestinal:  Positive for nausea. Negative for abdominal pain, constipation, diarrhea and vomiting.  Genitourinary:  Negative for dysuria, frequency and urgency.  Musculoskeletal:  Positive for joint pain and myalgias. Negative for back pain and falls.  Skin: Negative.  Negative for rash.  Neurological: Negative.  Negative for weakness and headaches.  Endo/Heme/Allergies: Negative.  Does not bruise/bleed easily.       Night sweats  Psychiatric/Behavioral:  Negative for depression. The patient has insomnia. The patient is not nervous/anxious.       Allergies  Allergen Reactions  Hctz [Hydrochlorothiazide]     Hyponatremia      Past Medical History:  Diagnosis Date   Anxiety    Arthritis    Asthma    Barrett's esophagus 2015   COVID-19    03/10/20   Depression    GERD (gastroesophageal reflux disease)    Hypertension    Hypothyroidism    Pneumonia    Thyroid disease      Past Surgical History:  Procedure Laterality  Date   ABLATION  2006   BREAST CYST ASPIRATION Left 1998   BREAST SURGERY     cyst aspiration   BREAST SURGERY  1998   cyst aspiration   COLONOSCOPY  03/2014   COLONOSCOPY WITH PROPOFOL N/A 01/06/2020   Procedure: COLONOSCOPY WITH PROPOFOL;  Surgeon: Toledo, Benay Pike, MD;  Location: [facility] ENDOSCOPY;  Service: Gastroenterology;  Laterality: N/A;   ENDOMETRIAL ABLATION     ESOPHAGOGASTRODUODENOSCOPY (EGD) WITH PROPOFOL N/A 08/15/2015   Procedure: ESOPHAGOGASTRODUODENOSCOPY (EGD) WITH PROPOFOL;  Surgeon: Lollie Sails, MD;  Location: [facility] ENDOSCOPY;  Service: Endoscopy;  Laterality: N/A;   ESOPHAGOGASTRODUODENOSCOPY (EGD) WITH PROPOFOL N/A 01/22/2016   Procedure: ESOPHAGOGASTRODUODENOSCOPY (EGD) WITH PROPOFOL;  Surgeon: Lollie Sails, MD;  Location: [facility] ENDOSCOPY;  Service: Endoscopy;  Laterality: N/A;   ESOPHAGOGASTRODUODENOSCOPY (EGD) WITH PROPOFOL N/A 01/06/2020   Procedure: ESOPHAGOGASTRODUODENOSCOPY (EGD) WITH PROPOFOL;  Surgeon: Toledo, Benay Pike, MD;  Location: [facility] ENDOSCOPY;  Service: Gastroenterology;  Laterality: N/A;   GASTRIC BYPASS  2005   HERNIA REPAIR     NASAL SINUS SURGERY     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   TOTAL THYROIDECTOMY     TUBAL LIGATION      Social History   Socioeconomic History   Marital status: Divorced    Spouse name: Not on file   Number of children: 2   Years of education: Not on file   Highest education level: High school graduate  Occupational History   Not on file  Tobacco Use   Smoking status: Every Day    Packs/day: 1.00    Years: 30.00    Pack years: 30.00    Types: Cigarettes    Start date: 03/15/1980   Smokeless tobacco: Never  Vaping Use   Vaping Use: Never used  Substance and Sexual Activity   Alcohol use: Yes    Alcohol/week: 10.0 - 12.0 standard drinks    Types: 8 - 10 Glasses of wine, 2 Standard drinks or equivalent per week   Drug use: No   Sexual activity: Yes    Partners: Male  Other Topics Concern   Not  on file  Social History Narrative   Lives in [place] single, divorced       Work - 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       Diet - healthy diet   Exercise - limited   Caffeine use: daily   Social Determinants of Health   Financial Resource Strain: Not on file  Food Insecurity: Not on file  Transportation Needs: Not on file  Physical Activity: Not on file  Stress: Not on file  Social Connections: Not on file  Intimate Partner Violence: Not on file    Family History  Problem Relation Age of Onset   Heart disease Mother    COPD Mother    Arthritis Mother    Cancer Mother        uterine   Lupus Mother    Anxiety disorder Mother    Depression  Mother    Drug abuse Mother    COPD Father    Arthritis Father    Heart disease Father    Heart attack Father    Alcohol abuse Father    Diabetes Maternal Grandmother    Diabetes Maternal Grandfather    Diabetes Paternal Grandmother    Diabetes Paternal Grandfather    Heart disease Brother    Heart attack Brother    Drug abuse Brother    Anxiety disorder Brother    Depression Brother    Heart disease Brother    Cancer Brother        liver cancer age 81 y.o   Breast cancer Neg Hx      Current Outpatient Medications:    albuterol (VENTOLIN HFA) 108 (90 Base) MCG/ACT inhaler, Inhale 2 puffs into the lungs every 6 (six) hours as needed for wheezing or shortness of breath., Disp: 18 g, Rfl: 11   amLODipine (NORVASC) 5 MG tablet, Take 1 tablet (5 mg total) by mouth daily., Disp: 90 tablet, Rfl: 3   budesonide-formoterol (SYMBICORT) 160-4.5 MCG/ACT inhaler, Inhale 2 puffs into the lungs 2 (two) times daily. Rinse mouth, Disp: 1 each, Rfl: 11   buPROPion (WELLBUTRIN XL) 150 MG 24 hr tablet, Take 1 tablet (150 mg total) by mouth daily., Disp: 90 tablet, Rfl: 3   cetirizine (ZYRTEC) 10 MG tablet, Take 1 tablet (10 mg total) by mouth daily., Disp: 90 tablet, Rfl: 3   Cholecalciferol 1.25 MG (50000 UT) capsule, Take 1 capsule  (50,000 Units total) by mouth once a week., Disp: 13 capsule, Rfl: 3   clonazePAM (KLONOPIN) 1 MG tablet, TAKE 1 TABLET(1 MG) BY MOUTH TWICE DAILY AS NEEDED FOR ANXIETY, Disp: 60 tablet, Rfl: 2   cyanocobalamin (,VITAMIN B-12,) 1000 MCG/ML injection, INJECT 1 ML INTO THE MUSCLE EVERY 30 DAYS, Disp: 4 mL, Rfl: 12   montelukast (SINGULAIR) 10 MG tablet, Take 1 tablet (10 mg total) by mouth at bedtime., Disp: 90 tablet, Rfl: 3   NEEDLE, DISP, 25 G 25G X 1-1/2" MISC, 1 Device by Does not apply route every 30 (thirty) days., Disp: 30 each, Rfl: 2   ondansetron (ZOFRAN) 8 MG tablet, Take 1 tablet (8 mg total) by mouth every 8 (eight) hours as needed for nausea or vomiting., Disp: 60 tablet, Rfl: 0   pantoprazole (PROTONIX) 40 MG tablet, TAKE 1 TABLET(40 MG) BY MOUTH DAILY IN THE MORNING 30 MINUTES BEFORE FOOD, Disp: 90 tablet, Rfl: 3   SYNTHROID 200 MCG tablet, TAKE 1 TABLET BY MOUTH ONCE daily before breakfast 30 minutes  Further refills from [facility] clinic endocrine Dr. Honor Junes, Marcello Moores no exceptions. Call to make appt endocrine, Disp: 90 tablet, Rfl: 3   telmisartan (MICARDIS) 80 MG tablet, 1/2 pill x 4 days in the am and day 5 1 whole pill if BP >130/>80 goal BP <130/<80. D/c lisinopril 20-hctz 12.5 2 pills daily, Disp: 90 tablet, Rfl: 3   venlafaxine XR (EFFEXOR-XR) 150 MG 24 hr capsule, TAKE 1 CAPSULE(150 MG) BY MOUTH DAILY WITH BREAKFAST, Disp: 90 capsule, Rfl: 3  Physical exam:  Vitals:   06/12/21 1322  BP: (!) 185/83  Pulse: 81  Resp: 20  Temp: (!) 96.6 F (35.9 C)  SpO2: 100%  Weight: 166 lb 6.4 oz (75.5 kg)   Physical Exam Constitutional:      Appearance: Normal appearance.  HENT:     Head: Normocephalic and atraumatic.  Eyes:     Pupils: Pupils are equal, round, and reactive to  light.  Cardiovascular:     Rate and Rhythm: Normal rate and regular rhythm.     Heart sounds: Normal heart sounds. No murmur heard. Pulmonary:     Effort: Pulmonary effort is normal.     Breath  sounds: Normal breath sounds. No wheezing.  Abdominal:     General: Bowel sounds are normal. There is no distension.     Palpations: Abdomen is soft.     Tenderness: There is no abdominal tenderness.  Musculoskeletal:        General: Normal range of motion.     Cervical back: Normal range of motion.  Skin:    General: Skin is warm and dry.     Findings: No rash.  Neurological:     Mental Status: She is alert and oriented to person, place, and time.  Psychiatric:        Judgment: Judgment normal.     CMP Latest Ref Rng & Units 06/12/2021  Glucose 70 - 99 mg/dL 96  BUN 6 - 20 mg/dL 11  Creatinine 0.44 - 1.00 mg/dL 0.69  Sodium 135 - 145 mmol/L 132(L)  Potassium 3.5 - 5.1 mmol/L 3.8  Chloride 98 - 111 mmol/L 95(L)  CO2 22 - 32 mmol/L 25  Calcium 8.9 - 10.3 mg/dL 9.9  Total Protein 6.5 - 8.1 g/dL 8.8(H)  Total Bilirubin 0.3 - 1.2 mg/dL 0.4  Alkaline Phos 38 - 126 U/L 47  AST 15 - 41 U/L 18  ALT 0 - 44 U/L 14   CBC Latest Ref Rng & Units 06/12/2021  WBC 4.0 - 10.5 K/uL 7.7  Hemoglobin 12.0 - 15.0 g/dL 13.4  Hematocrit 36.0 - 46.0 % 39.7  Platelets 150 - 400 K/uL 295    No images are attached to the encounter.  No results found.   Assessment and plan- Patient is a 55 y.o. female with history of smoldering multiple myeloma here for routine follow-up.  No crab criteria based on lab work today.  She is not anemic.  Renal function is normal.  Calcium level is 9.9.  Total protein is 8.8.  Myeloma panel is pending.  No overt indication for progression to myeloma.  No need for repeat bone marrow biopsy at this time.  Repeat CBC, CMP and myeloma panel with free light chains in 3 months and in 6 months.  She will see Dr. [name] back in 6 months with an assessment.  Weight loss-more or less stable.  Weight today is 166 which is 3 pounds down from June.  Nausea-unclear etiology but will send in a prescription for Zofran to use as needed.  Hot flashes/night  sweats-postmenopausal?  Disposition- Lab work in 3 months only and in 6 months see Dr. [name] with assessment and labs. Awaiting myeloma labs.  We will let her know if her appointments will change based on those labs.  I spent 25 minutes dedicated to the care of this patient (face-to-face and non-face-to-face) on the date of the encounter to include what is described in the assessment and plan.  Visit Diagnosis 1. Smoldering multiple myeloma   2. Night sweats   3. Nausea without vomiting     Faythe Casa, NP 06/12/2021 3:50 PM

## 2021-06-13 LAB — KAPPA/LAMBDA LIGHT CHAINS
Kappa free light chain: 12.8 mg/L (ref 3.3–19.4)
Kappa, lambda light chain ratio: 0.04 — ABNORMAL LOW (ref 0.26–1.65)
Lambda free light chains: 296.9 mg/L — ABNORMAL HIGH (ref 5.7–26.3)

## 2021-06-15 LAB — MULTIPLE MYELOMA PANEL, SERUM
Albumin SerPl Elph-Mcnc: 3.6 g/dL (ref 2.9–4.4)
Albumin/Glob SerPl: 0.7 (ref 0.7–1.7)
Alpha 1: 0.3 g/dL (ref 0.0–0.4)
Alpha2 Glob SerPl Elph-Mcnc: 0.7 g/dL (ref 0.4–1.0)
B-Globulin SerPl Elph-Mcnc: 4.2 g/dL — ABNORMAL HIGH (ref 0.7–1.3)
Gamma Glob SerPl Elph-Mcnc: 0.2 g/dL — ABNORMAL LOW (ref 0.4–1.8)
Globulin, Total: 5.4 g/dL — ABNORMAL HIGH (ref 2.2–3.9)
IgA: 22 mg/dL — ABNORMAL LOW (ref 87–352)
IgG (Immunoglobin G), Serum: 3754 mg/dL — ABNORMAL HIGH (ref 586–1602)
IgM (Immunoglobulin M), Srm: 55 mg/dL (ref 26–217)
M Protein SerPl Elph-Mcnc: 3.2 g/dL — ABNORMAL HIGH
Total Protein ELP: 9 g/dL — ABNORMAL HIGH (ref 6.0–8.5)

## 2021-07-17 ENCOUNTER — Other Ambulatory Visit: Payer: Self-pay | Admitting: Internal Medicine

## 2021-07-17 DIAGNOSIS — F419 Anxiety disorder, unspecified: Secondary | ICD-10-CM

## 2021-07-17 DIAGNOSIS — G4700 Insomnia, unspecified: Secondary | ICD-10-CM

## 2021-09-03 ENCOUNTER — Other Ambulatory Visit: Payer: Self-pay | Admitting: *Deleted

## 2021-09-03 DIAGNOSIS — D472 Monoclonal gammopathy: Secondary | ICD-10-CM

## 2021-09-10 ENCOUNTER — Inpatient Hospital Stay: Payer: Managed Care, Other (non HMO) | Attending: Oncology

## 2021-09-10 ENCOUNTER — Other Ambulatory Visit: Payer: Self-pay

## 2021-09-10 DIAGNOSIS — D472 Monoclonal gammopathy: Secondary | ICD-10-CM | POA: Diagnosis present

## 2021-09-10 LAB — COMPREHENSIVE METABOLIC PANEL
ALT: 13 U/L (ref 0–44)
AST: 19 U/L (ref 15–41)
Albumin: 3.1 g/dL — ABNORMAL LOW (ref 3.5–5.0)
Alkaline Phosphatase: 44 U/L (ref 38–126)
Anion gap: 9 (ref 5–15)
BUN: 9 mg/dL (ref 6–20)
CO2: 25 mmol/L (ref 22–32)
Calcium: 9.9 mg/dL (ref 8.9–10.3)
Chloride: 97 mmol/L — ABNORMAL LOW (ref 98–111)
Creatinine, Ser: 0.68 mg/dL (ref 0.44–1.00)
GFR, Estimated: >60 mL/min (ref >60)
Glucose, Bld: 110 mg/dL — ABNORMAL HIGH (ref 70–99)
Potassium: 3.8 mmol/L (ref 3.5–5.1)
Sodium: 131 mmol/L — ABNORMAL LOW (ref 135–145)
Total Bilirubin: 0.4 mg/dL (ref 0.3–1.2)
Total Protein: 8.6 g/dL — ABNORMAL HIGH (ref 6.5–8.1)

## 2021-09-10 LAB — CBC WITH DIFFERENTIAL/PLATELET
Abs Immature Granulocytes: 0.02 10*3/uL (ref 0.00–0.07)
Basophils Absolute: 0 10*3/uL (ref 0.0–0.1)
Basophils Relative: 1 %
Eosinophils Absolute: 0 10*3/uL (ref 0.0–0.5)
Eosinophils Relative: 0 %
HCT: 39.5 % (ref 36.0–46.0)
Hemoglobin: 13.2 g/dL (ref 12.0–15.0)
Immature Granulocytes: 0 %
Lymphocytes Relative: 33 %
Lymphs Abs: 1.7 10*3/uL (ref 0.7–4.0)
MCH: 30.1 pg (ref 26.0–34.0)
MCHC: 33.4 g/dL (ref 30.0–36.0)
MCV: 90 fL (ref 80.0–100.0)
Monocytes Absolute: 0.4 10*3/uL (ref 0.1–1.0)
Monocytes Relative: 7 %
Neutro Abs: 3.1 10*3/uL (ref 1.7–7.7)
Neutrophils Relative %: 59 %
Platelets: 293 10*3/uL (ref 150–400)
RBC: 4.39 MIL/uL (ref 3.87–5.11)
RDW: 14.6 % (ref 11.5–15.5)
WBC: 5.2 10*3/uL (ref 4.0–10.5)
nRBC: 0 % (ref 0.0–0.2)

## 2021-09-11 LAB — KAPPA/LAMBDA LIGHT CHAINS
Kappa free light chain: 11.8 mg/L (ref 3.3–19.4)
Kappa, lambda light chain ratio: 0.04 — ABNORMAL LOW (ref 0.26–1.65)
Lambda free light chains: 275.7 mg/L — ABNORMAL HIGH (ref 5.7–26.3)

## 2021-09-14 LAB — MULTIPLE MYELOMA PANEL, SERUM
Albumin SerPl Elph-Mcnc: 3.6 g/dL (ref 2.9–4.4)
Albumin/Glob SerPl: 0.8 (ref 0.7–1.7)
Alpha 1: 0.3 g/dL (ref 0.0–0.4)
Alpha2 Glob SerPl Elph-Mcnc: 0.7 g/dL (ref 0.4–1.0)
B-Globulin SerPl Elph-Mcnc: 3.5 g/dL — ABNORMAL HIGH (ref 0.7–1.3)
Gamma Glob SerPl Elph-Mcnc: 0.3 g/dL — ABNORMAL LOW (ref 0.4–1.8)
Globulin, Total: 4.8 g/dL — ABNORMAL HIGH (ref 2.2–3.9)
IgA: 20 mg/dL — ABNORMAL LOW (ref 87–352)
IgG (Immunoglobin G), Serum: 3441 mg/dL — ABNORMAL HIGH (ref 586–1602)
IgM (Immunoglobulin M), Srm: 48 mg/dL (ref 26–217)
M Protein SerPl Elph-Mcnc: 2.2 g/dL — ABNORMAL HIGH
Total Protein ELP: 8.4 g/dL (ref 6.0–8.5)

## 2021-09-25 ENCOUNTER — Encounter: Payer: Self-pay | Admitting: Internal Medicine

## 2021-10-01 ENCOUNTER — Other Ambulatory Visit: Payer: Self-pay | Admitting: Internal Medicine

## 2021-10-01 DIAGNOSIS — E538 Deficiency of other specified B group vitamins: Secondary | ICD-10-CM

## 2021-10-01 MED ORDER — "SYRINGE/NEEDLE (DISP) 25G X 1-1/2"" 3 ML MISC": 1.0000 | 0 refills | Status: DC

## 2021-10-09 ENCOUNTER — Encounter: Payer: Managed Care, Other (non HMO) | Admitting: Internal Medicine

## 2021-10-10 ENCOUNTER — Encounter: Payer: Managed Care, Other (non HMO) | Admitting: Internal Medicine

## 2021-10-15 ENCOUNTER — Other Ambulatory Visit: Payer: Self-pay | Admitting: Internal Medicine

## 2021-10-15 DIAGNOSIS — F419 Anxiety disorder, unspecified: Secondary | ICD-10-CM

## 2021-10-15 DIAGNOSIS — G4700 Insomnia, unspecified: Secondary | ICD-10-CM

## 2021-10-25 ENCOUNTER — Ambulatory Visit (INDEPENDENT_AMBULATORY_CARE_PROVIDER_SITE_OTHER): Payer: Managed Care, Other (non HMO) | Admitting: Internal Medicine

## 2021-10-25 ENCOUNTER — Encounter: Payer: Self-pay | Admitting: Internal Medicine

## 2021-10-25 VITALS — BP 124/80 | HR 68 | Temp 97.5°F | Resp 14 | Ht 63.0 in | Wt 173.2 lb

## 2021-10-25 DIAGNOSIS — E538 Deficiency of other specified B group vitamins: Secondary | ICD-10-CM | POA: Diagnosis not present

## 2021-10-25 DIAGNOSIS — I10 Essential (primary) hypertension: Secondary | ICD-10-CM | POA: Diagnosis not present

## 2021-10-25 DIAGNOSIS — Z9884 Bariatric surgery status: Secondary | ICD-10-CM

## 2021-10-25 DIAGNOSIS — J40 Bronchitis, not specified as acute or chronic: Secondary | ICD-10-CM

## 2021-10-25 DIAGNOSIS — G4700 Insomnia, unspecified: Secondary | ICD-10-CM | POA: Diagnosis not present

## 2021-10-25 DIAGNOSIS — J309 Allergic rhinitis, unspecified: Secondary | ICD-10-CM

## 2021-10-25 DIAGNOSIS — E559 Vitamin D deficiency, unspecified: Secondary | ICD-10-CM

## 2021-10-25 DIAGNOSIS — R5382 Chronic fatigue, unspecified: Secondary | ICD-10-CM

## 2021-10-25 DIAGNOSIS — R7303 Prediabetes: Secondary | ICD-10-CM | POA: Diagnosis not present

## 2021-10-25 DIAGNOSIS — E785 Hyperlipidemia, unspecified: Secondary | ICD-10-CM

## 2021-10-25 DIAGNOSIS — E611 Iron deficiency: Secondary | ICD-10-CM | POA: Diagnosis not present

## 2021-10-25 DIAGNOSIS — Z1231 Encounter for screening mammogram for malignant neoplasm of breast: Secondary | ICD-10-CM

## 2021-10-25 DIAGNOSIS — F419 Anxiety disorder, unspecified: Secondary | ICD-10-CM | POA: Diagnosis not present

## 2021-10-25 DIAGNOSIS — Z6830 Body mass index (BMI) 30.0-30.9, adult: Secondary | ICD-10-CM

## 2021-10-25 DIAGNOSIS — K219 Gastro-esophageal reflux disease without esophagitis: Secondary | ICD-10-CM

## 2021-10-25 DIAGNOSIS — E039 Hypothyroidism, unspecified: Secondary | ICD-10-CM

## 2021-10-25 DIAGNOSIS — F32A Depression, unspecified: Secondary | ICD-10-CM

## 2021-10-25 LAB — COMPREHENSIVE METABOLIC PANEL
ALT: 8 U/L (ref 0–35)
AST: 9 U/L (ref 0–37)
Albumin: 2.7 g/dL — ABNORMAL LOW (ref 3.5–5.2)
Alkaline Phosphatase: 42 U/L (ref 39–117)
BUN: 11 mg/dL (ref 6–23)
CO2: 29 mEq/L (ref 19–32)
Calcium: 9.3 mg/dL (ref 8.4–10.5)
Chloride: 100 mEq/L (ref 96–112)
Creatinine, Ser: 0.69 mg/dL (ref 0.40–1.20)
GFR: 95.86 mL/min (ref >60.00)
Glucose, Bld: 80 mg/dL (ref 70–99)
Potassium: 4.9 mEq/L (ref 3.5–5.1)
Sodium: 135 mEq/L (ref 135–145)
Total Bilirubin: 0.3 mg/dL (ref 0.2–1.2)
Total Protein: 7.4 g/dL (ref 6.0–8.3)

## 2021-10-25 LAB — LIPID PANEL
Cholesterol: 191 mg/dL (ref 0–200)
HDL: 75.1 mg/dL (ref >39.00)
LDL Cholesterol: 104 mg/dL — ABNORMAL HIGH (ref 0–99)
NonHDL: 115.43
Total CHOL/HDL Ratio: 3
Triglycerides: 59 mg/dL (ref 0.0–149.0)
VLDL: 11.8 mg/dL (ref 0.0–40.0)

## 2021-10-25 LAB — IBC + FERRITIN
Ferritin: 22.8 ng/mL (ref 10.0–291.0)
Iron: 99 ug/dL (ref 42–145)
Saturation Ratios: 30.7 % (ref 20.0–50.0)
TIBC: 322 ug/dL (ref 250.0–450.0)
Transferrin: 230 mg/dL (ref 212.0–360.0)

## 2021-10-25 LAB — HEMOGLOBIN A1C: Hgb A1c MFr Bld: 6.2 % (ref 4.6–6.5)

## 2021-10-25 LAB — VITAMIN B12: Vitamin B-12: >1504 pg/mL — ABNORMAL HIGH (ref 211–911)

## 2021-10-25 LAB — TSH: TSH: 7.83 u[IU]/mL — ABNORMAL HIGH (ref 0.35–5.50)

## 2021-10-25 MED ORDER — BUPROPION HCL ER (XL) 150 MG PO TB24: 150.0000 mg | ORAL_TABLET | Freq: Every day | ORAL | 3 refills | Status: DC

## 2021-10-25 MED ORDER — WEGOVY 0.5 MG/0.5ML ~~LOC~~ SOAJ: 0.5000 mg | SUBCUTANEOUS | 0 refills | Status: DC

## 2021-10-25 MED ORDER — BUDESONIDE-FORMOTEROL FUMARATE 160-4.5 MCG/ACT IN AERO: 2.0000 | INHALATION_SPRAY | Freq: Two times a day (BID) | RESPIRATORY_TRACT | 11 refills | Status: DC

## 2021-10-25 MED ORDER — WEGOVY 0.25 MG/0.5ML ~~LOC~~ SOAJ: 0.2500 mg | SUBCUTANEOUS | 0 refills | Status: DC

## 2021-10-25 MED ORDER — TELMISARTAN 80 MG PO TABS: ORAL_TABLET | ORAL | 3 refills | Status: DC

## 2021-10-25 MED ORDER — PANTOPRAZOLE SODIUM 40 MG PO TBEC: DELAYED_RELEASE_TABLET | ORAL | 3 refills | Status: DC

## 2021-10-25 MED ORDER — "SYRINGE/NEEDLE (DISP) 25G X 1-1/2"" 3 ML MISC": 1.0000 | 1 refills | Status: DC

## 2021-10-25 MED ORDER — MONTELUKAST SODIUM 10 MG PO TABS: 10.0000 mg | ORAL_TABLET | Freq: Every day | ORAL | 3 refills | Status: DC

## 2021-10-25 MED ORDER — WEGOVY 1.7 MG/0.75ML ~~LOC~~ SOAJ: 1.7000 mg | SUBCUTANEOUS | 0 refills | Status: DC

## 2021-10-25 MED ORDER — VORTIOXETINE HBR 5 MG PO TABS: 5.0000 mg | ORAL_TABLET | Freq: Every day | ORAL | 3 refills | Status: DC

## 2021-10-25 MED ORDER — WEGOVY 1 MG/0.5ML ~~LOC~~ SOAJ: 1.0000 mg | SUBCUTANEOUS | 0 refills | Status: DC

## 2021-10-25 MED ORDER — AMLODIPINE BESYLATE 5 MG PO TABS: 5.0000 mg | ORAL_TABLET | Freq: Every day | ORAL | 3 refills | Status: DC

## 2021-10-25 MED ORDER — CYANOCOBALAMIN 1000 MCG/ML IJ SOLN
1000.0000 ug | Freq: Once | INTRAMUSCULAR | Status: AC
  Administered 2021-10-25: 1000 ug via INTRAMUSCULAR

## 2021-10-25 MED ORDER — WEGOVY 2.4 MG/0.75ML ~~LOC~~ SOAJ: 2.4000 mg | SUBCUTANEOUS | 5 refills | Status: DC

## 2021-10-25 MED ORDER — CHOLECALCIFEROL 1.25 MG (50000 UT) PO CAPS: 50000.0000 [IU] | ORAL_CAPSULE | ORAL | 3 refills | Status: DC

## 2021-10-25 NOTE — Progress Notes (Signed)
Chief Complaint  ?Patient presents with  ? Annual Exam  ?  Had coffee w/creamer this morning, denies any pain.  ? ?F/u  ?1. Htn controlled on micardis 80 and norvasc 5 mg qd  ?2. Hypothyroidism tsh > 8 in 08/2021 f/u endocrine on levo 200 mcg qd  ?3.obesity s/p gastric bypass unable to lose wt wants to try wegovy ?4. Anxiety >depression stopped effexor and wellbutrin not effective on klonopin has been on zoloft, cymbalta in the past w/o help  ?Has not called [facility] stress is taking care if 58 y.o mom and brother and retired but still workin g ? ? ?Review of Systems  ?Constitutional:  Negative for weight loss.  ?HENT:  Negative for hearing loss.   ?Eyes:  Negative for blurred vision.  ?Respiratory:  Negative for shortness of breath.   ?Cardiovascular:  Negative for chest pain.  ?Gastrointestinal:  Negative for abdominal pain and blood in stool.  ?Genitourinary:  Negative for dysuria.  ?Musculoskeletal:  Negative for falls and joint pain.  ?Skin:  Negative for rash.  ?Neurological:  Negative for headaches.  ?Psychiatric/Behavioral:  Negative for depression.   ?Past Medical History:  ?Diagnosis Date  ? Anxiety   ? Arthritis   ? Asthma   ? Barrett's esophagus 2015  ? COVID-19   ? 03/10/20  ? Depression   ? GERD (gastroesophageal reflux disease)   ? Hypertension   ? Hypothyroidism   ? Pneumonia   ? Thyroid disease   ? ?Past Surgical History:  ?Procedure Laterality Date  ? ABLATION  2006  ? BREAST CYST ASPIRATION Left 1998  ? BREAST SURGERY    ? cyst aspiration  ? BREAST SURGERY  1998  ? cyst aspiration  ? COLONOSCOPY  03/2014  ? COLONOSCOPY WITH PROPOFOL N/A 01/06/2020  ? Procedure: COLONOSCOPY WITH PROPOFOL;  Surgeon: Toledo, Benay Pike, MD;  Location: [facility] ENDOSCOPY;  Service: Gastroenterology;  Laterality: N/A;  ? ENDOMETRIAL ABLATION    ? ESOPHAGOGASTRODUODENOSCOPY (EGD) WITH PROPOFOL N/A 08/15/2015  ? Procedure: ESOPHAGOGASTRODUODENOSCOPY (EGD) WITH PROPOFOL;  Surgeon: Lollie Sails, MD;  Location: [facility]  ENDOSCOPY;  Service: Endoscopy;  Laterality: N/A;  ? ESOPHAGOGASTRODUODENOSCOPY (EGD) WITH PROPOFOL N/A 01/22/2016  ? Procedure: ESOPHAGOGASTRODUODENOSCOPY (EGD) WITH PROPOFOL;  Surgeon: Lollie Sails, MD;  Location: [facility] ENDOSCOPY;  Service: Endoscopy;  Laterality: N/A;  ? ESOPHAGOGASTRODUODENOSCOPY (EGD) WITH PROPOFOL N/A 01/06/2020  ? Procedure: ESOPHAGOGASTRODUODENOSCOPY (EGD) WITH PROPOFOL;  Surgeon: Toledo, Benay Pike, MD;  Location: [facility] ENDOSCOPY;  Service: Gastroenterology;  Laterality: N/A;  ? GASTRIC BYPASS  2005  ? HERNIA REPAIR    ? NASAL SINUS SURGERY    ? 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 TOTAL THYROIDECTOMY    ? TUBAL LIGATION    ? ?Family History  ?Problem Relation Age of Onset  ? Heart disease Mother   ? COPD Mother   ? Arthritis Mother   ? Cancer Mother   ?     uterine  ? Lupus Mother   ? Anxiety disorder Mother   ? Depression Mother   ? Drug abuse Mother   ? COPD Father   ? Arthritis Father   ? Heart disease Father   ? Heart attack Father   ? Alcohol abuse Father   ? Diabetes Maternal Grandmother   ? Diabetes Maternal Grandfather   ? Diabetes Paternal Grandmother   ? Diabetes Paternal Grandfather   ? Heart disease Brother   ? Heart attack Brother   ? Drug abuse Brother   ?  Anxiety disorder Brother   ? Depression Brother   ? Heart disease Brother   ? Cancer Brother   ?     liver cancer age 66 y.o  ? Breast cancer Neg Hx   ? ?Social History  ? ?Socioeconomic History  ? Marital status: Divorced  ?  Spouse name: Not on file  ? Number of children: 2  ? Years of education: Not on file  ? Highest education level: High school graduate  ?Occupational History  ? Not on file  ?Tobacco Use  ? Smoking status: Every Day  ?  Packs/day: 1.00  ?  Years: 30.00  ?  Pack years: 30.00  ?  Types: Cigarettes  ?  Start date: 03/15/1980  ? Smokeless tobacco: Never  ?Vaping Use  ? Vaping Use: Never used  ?Substance and Sexual Activity  ? Alcohol use: Yes  ?  Alcohol/week: 10.0 - 12.0 standard drinks  ?  Types: 8  - 10 Glasses of wine, 2 Standard drinks or equivalent per week  ? Drug use: No  ? Sexual activity: Yes  ?  Partners: Male  ?Other Topics Concern  ? Not on file  ?Social History Narrative  ? Lives in [place] single, divorced   ?   ? Work - 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   ?   ? Diet - healthy diet  ? Exercise - limited  ? Caffeine use: daily  ? ?Social Determinants of Health  ? ?Financial Resource Strain: Not on file  ?Food Insecurity: Not on file  ?Transportation Needs: Not on file  ?Physical Activity: Not on file  ?Stress: Not on file  ?Social Connections: Not on file  ?Intimate Partner Violence: Not on file  ? ?Current Meds  ?Medication Sig  ? cetirizine (ZYRTEC) 10 MG tablet Take 1 tablet (10 mg total) by mouth daily.  ? clonazePAM (KLONOPIN) 1 MG tablet TAKE 1 TABLET(1 MG) BY MOUTH TWICE DAILY AS NEEDED FOR ANXIETY  ? cyanocobalamin (,VITAMIN B-12,) 1000 MCG/ML injection INJECT 1 ML INTO THE MUSCLE EVERY 30 DAYS  ? ondansetron (ZOFRAN) 8 MG tablet Take 1 tablet (8 mg total) by mouth every 8 (eight) hours as needed for nausea or vomiting.  ? Semaglutide-Weight Management (WEGOVY) 0.25 MG/0.5ML SOAJ Inject 0.25 mg into the skin once a week.  ? Semaglutide-Weight Management (WEGOVY) 0.5 MG/0.5ML SOAJ Inject 0.5 mg into the skin once a week.  ? Semaglutide-Weight Management (WEGOVY) 1 MG/0.5ML SOAJ Inject 1 mg into the skin once a week.  ? Semaglutide-Weight Management (WEGOVY) 1.7 MG/0.75ML SOAJ Inject 1.7 mg into the skin once a week.  ? Semaglutide-Weight Management (WEGOVY) 2.4 MG/0.75ML SOAJ Inject 2.4 mg into the skin once a week.  ? SYNTHROID 200 MCG tablet TAKE 1 TABLET BY MOUTH ONCE daily before breakfast 30 minutes  Further refills [facility] Dr. Honor Junes, Marcello Moores no exceptions. Call to make appt endocrine  ? vortioxetine HBr (TRINTELLIX) 5 MG TABS tablet Take 1 tablet (5 mg total) by mouth daily. Failed cymbalta, zoloft, effexor and wellbutrin  ? [DISCONTINUED] albuterol  (VENTOLIN HFA) 108 (90 Base) MCG/ACT inhaler Inhale 2 puffs into the lungs every 6 (six) hours as needed for wheezing or shortness of breath.  ? [DISCONTINUED] amLODipine (NORVASC) 5 MG tablet Take 1 tablet (5 mg total) by mouth daily.  ? [DISCONTINUED] budesonide-formoterol (SYMBICORT) 160-4.5 MCG/ACT inhaler Inhale 2 puffs into the lungs 2 (two) times daily. Rinse mouth  ? [DISCONTINUED] buPROPion (WELLBUTRIN XL) 150 MG 24 hr tablet  Take 1 tablet (150 mg total) by mouth daily.  ? [DISCONTINUED] Cholecalciferol 1.25 MG (50000 UT) capsule Take 1 capsule (50,000 Units total) by mouth once a week.  ? [DISCONTINUED] montelukast (SINGULAIR) 10 MG tablet Take 1 tablet (10 mg total) by mouth at bedtime.  ? [DISCONTINUED] NEEDLE, DISP, 25 G 25G X 1-1/2" MISC 1 Device by Does not apply route every 30 (thirty) days.  ? [DISCONTINUED] pantoprazole (PROTONIX) 40 MG tablet TAKE 1 TABLET(40 MG) BY MOUTH DAILY IN THE MORNING 30 MINUTES BEFORE FOOD  ? [DISCONTINUED] SYRINGE-NEEDLE, DISP, 3 ML 25G X 1-1/2" 3 ML MISC 1 Device by Does not apply route every 30 (thirty) days. With B12 shot  ? [DISCONTINUED] telmisartan (MICARDIS) 80 MG tablet 1/2 pill x 4 days in the am and day 5 1 whole pill if BP >130/>80 goal BP <130/<80. D/c lisinopril 20-hctz 12.5 2 pills daily  ? [DISCONTINUED] venlafaxine XR (EFFEXOR-XR) 150 MG 24 hr capsule TAKE 1 CAPSULE(150 MG) BY MOUTH DAILY WITH BREAKFAST  ? ?Current Facility-Administered Medications for the 10/25/21 encounter (Office Visit) with McLean-Scocuzza, Nino Glow, MD  ?Medication  ? cyanocobalamin ((VITAMIN B-12)) injection 1,000 mcg  ? ?Allergies  ?Allergen Reactions  ? Hctz [Hydrochlorothiazide]   ?  Hyponatremia   ? ?Recent Results (from the past 2160 hour(s))  ?Kappa/lambda light chains     Status: Abnormal  ? Collection Time: 09/10/21  1:53 PM  ?Result Value Ref Range  ? Kappa free light chain 11.8 3.3 - 19.4 mg/L  ? Lambda free light chains 275.7 (H) 5.7 - 26.3 mg/L  ? Kappa, lambda light chain  ratio 0.04 (L) 0.26 - 1.65  ?  Comment: (NOTE) ?Performed At: [facility] ?[address] [place], [place] [id number] ?Rush Farmer MD [id number] ?  ?Multiple Myeloma Panel (SPEP&IFE w/QIG)     Statu

## 2021-10-25 NOTE — Patient Instructions (Addendum)
Vortioxetine Tablets ?What is this medication? ?Vortioxetine (vor tee [employer] e teen) treats depression. It increases the amount of serotonin in the brain, a substance that helps regulate mood. ?This medicine may be used for other purposes; ask your health care provider or pharmacist if you have questions. ?COMMON BRAND NAME(S): BRINTELLIX, Trintellix ?What should I tell my care team before I take this medication? ?They need to know if you have any of these conditions: ?Bipolar disorder or a family history of bipolar disorder ?Bleeding disorder ?Glaucoma ?Liver disease ?Low levels of sodium in the blood ?Seizures ?Suicidal thoughts, plans, or attempt; a previous suicide attempt by you or a family member ?Take medications that treat or prevent blood clots ?Taken an MAOI such as Marplan, Nardil, or Parnate in the last 14 days ?An unusual or allergic reaction to vortioxetine, other medications, foods, dyes, or preservatives ?Pregnant or trying to get pregnant ?Breast-feeding ?How should I use this medication? ?Take this medication by mouth with water. Take it as directed on the prescription label at the same time every day. You can take it with or without food. If it upsets your stomach, take it with food. Keep taking it unless your care team tells you to stop. ?A special MedGuide will be given to you by the pharmacist with each prescription and refill. Be sure to read this information carefully each time. ?Talk to your care team about the use of this medication in children. Special care may be needed. ?Overdosage: If you think you have taken too much of this medicine contact a poison control center or emergency room at once. ?NOTE: This medicine is only for you. Do not share this medicine with others. ?What if I miss a dose? ?If you miss a dose, take it as soon as you can. If it is almost time for your next dose, take only that dose. Do not take double or extra doses. ?What may interact with this medication? ?Do not take  this medication with any of the following: ?Linezolid ?MAOIs like Carbex, Eldepryl, Marplan, Nardil, and Parnate ?Methylene blue (injected into a vein) ?This medication may also interact with the following: ?Aspirin and aspirin-like medications ?Carbamazepine ?Certain medications for depression, anxiety, or psychotic disturbances ?Certain medications for migraine headache like almotriptan, eletriptan, frovatriptan, naratriptan, rizatriptan, sumatriptan, zolmitriptan ?Diuretics ?Fentanyl ?Furazolidone ?Isoniazid ?Lithium ?Medications that treat or prevent blood clots like warfarin, enoxaparin, and dalteparin ?NSAIDs, medications for pain and inflammation, like ibuprofen or naproxen ?Phenytoin ?Procarbazine ?Quinidine ?Rasagiline ?Rifampin ?Safinamide ?Supplements like St. John's Wort, kava kava, valerian ?Tramadol ?Tryptophan ?This list may not describe all possible interactions. Give your health care provider a list of all the medicines, herbs, non-prescription drugs, or dietary supplements you use. Also tell them if you smoke, drink alcohol, or use illegal drugs. Some items may interact with your medicine. ?What should I watch for while using this medication? ?Visit your care team for regular checks on your progress. Tell your care team if your symptoms do not start to get better or if they get worse. ?Patients and their families should watch out for new or worsening depression or thoughts of suicide. Also watch out for sudden changes in feelings such as feeling anxious, agitated, panicky, irritable, hostile, aggressive, impulsive, severely restless, overly excited and hyperactive, or not being able to sleep. If this happens, especially at the beginning of treatment or after a change in dose, call your care team. ?You may get drowsy or dizzy. Do not drive, use machinery, or do anything that needs  mental alertness until you know how this medication affects you. Do not stand or sit up quickly, especially if you are  an older patient. This reduces the risk of dizzy or fainting spells. ?What side effects may I notice from receiving this medication? ?Side effects that you should report to your care team as soon as possible: ?Allergic reactions--skin rash, itching, hives, swelling of the face, lips, tongue, or throat ?Bleeding--bloody or black, tar-like stools, vomiting blood or brown material that looks like coffee grounds, red or dark brown urine, small red or purple spots on skin, unusual bruising or bleeding ?Irritability, confusion, fast or irregular heartbeat, muscle stiffness, twitching muscles, sweating, high fever, seizure, chills, vomiting, diarrhea, which may be signs of serotonin syndrome ?Low sodium level--muscle weakness, fatigue, dizziness, headache, confusion ?Sudden eye pain or change in vision such as blurry vision, seeing halos around lights, vision loss ?Thoughts of suicide or self-harm, worsening mood, feelings of depression ?Side effects that usually do not require medical attention (report to your care team if they continue or are bothersome): ?Change in sex drive or performance ?Constipation ?Nausea ?Vomiting ?This list may not describe all possible side effects. Call your doctor for medical advice about side effects. You may report side effects to FDA at 1-800-FDA-1088. ?Where should I keep my medication? ?Keep out of the reach of children and pets. ?Store at room temperature between 20 and 25 degrees C (68 and 77 degrees F). Throw away any unused medication after the expiration date. ?NOTE: This sheet is a summary. It may not cover all possible information. If you have questions about this medicine, talk to your doctor, pharmacist, or health care provider. ?? 2023 Elsevier/Gold Standard (2021-05-04 00:00:00) ? ?Semaglutide Injection (Weight Management) ?What is this medication? ?SEMAGLUTIDE (SEM a GLOO tide) promotes weight loss. It may also be used to maintain weight loss. It works by decreasing appetite.  Changes to diet and exercise are often combined with this medication. ?This medicine may be used for other purposes; ask your health care provider or pharmacist if you have questions. ?COMMON BRAND NAME(S): Wegovy ?What should I tell my care team before I take this medication? ?They need to know if you have any of these conditions: ?Endocrine tumors (MEN 2) or if someone in your family had these tumors ?Eye disease, vision problems ?Gallbladder disease ?History of depression or mental health disease ?History of pancreatitis ?Kidney disease ?Stomach or intestine problems ?Suicidal thoughts, plans, or attempt; a previous suicide attempt by you or a family member ?Thyroid cancer or if someone in your family had thyroid cancer ?An unusual or allergic reaction to semaglutide, other medications, foods, dyes, or preservatives ?Pregnant or trying to get pregnant ?Breast-feeding ?How should I use this medication? ?This medication is injected under the skin. You will be taught how to prepare and give it. Take it as directed on the prescription label. It is given once every week (every 7 days). Keep taking it unless your care team tells you to stop. ?It is important that you put your used needles and pens in a special sharps container. Do not put them in a trash can. If you do not have a sharps container, call your pharmacist or care team to get one. ?A special MedGuide will be given to you by the pharmacist with each prescription and refill. Be sure to read this information carefully each time. ?This medication comes with INSTRUCTIONS FOR USE. Ask your pharmacist for directions on how to use this medication. Read the information carefully.  Talk to your pharmacist or care team if you have questions. ?Talk to your care team about the use of this medication in children. While it may be prescribed for children as young as 12 years for selected conditions, precautions do apply. ?Overdosage: If you think you have taken too much of  this medicine contact a poison control center or emergency room at once. ?NOTE: This medicine is only for you. Do not share this medicine with others. ?What if I miss a dose? ?If you miss a dose and th

## 2021-10-29 ENCOUNTER — Telehealth: Payer: Self-pay

## 2021-10-29 NOTE — Telephone Encounter (Signed)
PA started via covermymeds for pt's vortioxetine HBr (TRINTELLIX) 5 MG TABS ? ?Key: [id number]  ?Rx #: [id number] ? ?Awaiting denial or approval  ?

## 2021-11-14 ENCOUNTER — Other Ambulatory Visit: Payer: Self-pay | Admitting: Internal Medicine

## 2021-11-14 DIAGNOSIS — K219 Gastro-esophageal reflux disease without esophagitis: Secondary | ICD-10-CM

## 2021-11-15 ENCOUNTER — Other Ambulatory Visit: Payer: Self-pay | Admitting: Internal Medicine

## 2021-11-15 ENCOUNTER — Encounter: Payer: Self-pay | Admitting: Internal Medicine

## 2021-11-16 ENCOUNTER — Other Ambulatory Visit: Payer: Self-pay | Admitting: Internal Medicine

## 2021-11-16 ENCOUNTER — Telehealth: Payer: Self-pay

## 2021-11-16 DIAGNOSIS — F419 Anxiety disorder, unspecified: Secondary | ICD-10-CM

## 2021-11-16 DIAGNOSIS — G4700 Insomnia, unspecified: Secondary | ICD-10-CM

## 2021-11-16 MED ORDER — VORTIOXETINE HBR 5 MG PO TABS: 5.0000 mg | ORAL_TABLET | Freq: Every day | ORAL | 11 refills | Status: DC

## 2021-11-16 NOTE — Telephone Encounter (Signed)
[employer] insurance '@800'$ -[phone] to go pt's Trintellix authorized since PA was not working through [employer]. S/w Bill '@cigna'$  and had PA authorized. Plan has restriction of 30 tabs monthly only which does pay out, unable to give 90 day supply due coverage restriction which would cause denial.   New rx will be sent to pharmacy with 30 day supply only to [facility]   Case [id number]  Effective date: 11/16/21-indefinite (no ending coverage)

## 2021-12-11 ENCOUNTER — Other Ambulatory Visit: Payer: Managed Care, Other (non HMO)

## 2021-12-11 ENCOUNTER — Ambulatory Visit: Payer: Managed Care, Other (non HMO) | Admitting: Oncology

## 2021-12-14 ENCOUNTER — Other Ambulatory Visit: Payer: Self-pay

## 2021-12-14 ENCOUNTER — Encounter: Payer: Self-pay | Admitting: Oncology

## 2021-12-14 ENCOUNTER — Inpatient Hospital Stay: Payer: Managed Care, Other (non HMO) | Attending: Oncology

## 2021-12-14 ENCOUNTER — Inpatient Hospital Stay (HOSPITAL_BASED_OUTPATIENT_CLINIC_OR_DEPARTMENT_OTHER): Payer: Managed Care, Other (non HMO) | Admitting: Oncology

## 2021-12-14 VITALS — BP 126/81 | HR 68 | Temp 96.2°F | Ht 63.0 in | Wt 164.2 lb

## 2021-12-14 DIAGNOSIS — D509 Iron deficiency anemia, unspecified: Secondary | ICD-10-CM

## 2021-12-14 DIAGNOSIS — Z8049 Family history of malignant neoplasm of other genital organs: Secondary | ICD-10-CM | POA: Diagnosis not present

## 2021-12-14 DIAGNOSIS — D472 Monoclonal gammopathy: Secondary | ICD-10-CM

## 2021-12-14 DIAGNOSIS — D508 Other iron deficiency anemias: Secondary | ICD-10-CM | POA: Diagnosis not present

## 2021-12-14 DIAGNOSIS — Z800 Family history of malignant neoplasm of digestive organs: Secondary | ICD-10-CM | POA: Diagnosis not present

## 2021-12-14 DIAGNOSIS — F17210 Nicotine dependence, cigarettes, uncomplicated: Secondary | ICD-10-CM | POA: Diagnosis not present

## 2021-12-14 DIAGNOSIS — Z9884 Bariatric surgery status: Secondary | ICD-10-CM | POA: Insufficient documentation

## 2021-12-14 DIAGNOSIS — C9000 Multiple myeloma not having achieved remission: Secondary | ICD-10-CM | POA: Insufficient documentation

## 2021-12-14 LAB — CBC WITH DIFFERENTIAL/PLATELET
Abs Immature Granulocytes: 0 10*3/uL (ref 0.00–0.07)
Basophils Absolute: 0 10*3/uL (ref 0.0–0.1)
Basophils Relative: 1 %
Eosinophils Absolute: 0.1 10*3/uL (ref 0.0–0.5)
Eosinophils Relative: 2 %
HCT: 35.1 % — ABNORMAL LOW (ref 36.0–46.0)
Hemoglobin: 11.6 g/dL — ABNORMAL LOW (ref 12.0–15.0)
Immature Granulocytes: 0 %
Lymphocytes Relative: 43 %
Lymphs Abs: 2.4 10*3/uL (ref 0.7–4.0)
MCH: 30 pg (ref 26.0–34.0)
MCHC: 33 g/dL (ref 30.0–36.0)
MCV: 90.7 fL (ref 80.0–100.0)
Monocytes Absolute: 0.6 10*3/uL (ref 0.1–1.0)
Monocytes Relative: 12 %
Neutro Abs: 2.2 10*3/uL (ref 1.7–7.7)
Neutrophils Relative %: 42 %
Platelets: 276 10*3/uL (ref 150–400)
RBC: 3.87 MIL/uL (ref 3.87–5.11)
RDW: 14.9 % (ref 11.5–15.5)
WBC: 5.3 10*3/uL (ref 4.0–10.5)
nRBC: 0 % (ref 0.0–0.2)

## 2021-12-14 LAB — COMPREHENSIVE METABOLIC PANEL
ALT: 9 U/L (ref 0–44)
AST: 12 U/L — ABNORMAL LOW (ref 15–41)
Albumin: 2.5 g/dL — ABNORMAL LOW (ref 3.5–5.0)
Alkaline Phosphatase: 39 U/L (ref 38–126)
Anion gap: 7 (ref 5–15)
BUN: 19 mg/dL (ref 6–20)
CO2: 26 mmol/L (ref 22–32)
Calcium: 9.6 mg/dL (ref 8.9–10.3)
Chloride: 101 mmol/L (ref 98–111)
Creatinine, Ser: 0.53 mg/dL (ref 0.44–1.00)
GFR, Estimated: >60 mL/min (ref >60)
Glucose, Bld: 100 mg/dL — ABNORMAL HIGH (ref 70–99)
Potassium: 4 mmol/L (ref 3.5–5.1)
Sodium: 134 mmol/L — ABNORMAL LOW (ref 135–145)
Total Bilirubin: 0.5 mg/dL (ref 0.3–1.2)
Total Protein: 8.2 g/dL — ABNORMAL HIGH (ref 6.5–8.1)

## 2021-12-14 NOTE — Progress Notes (Signed)
Hematology/Oncology Consult note [facility]  Telephone:([phone] [phone]  Patient Care Team: McLean-Scocuzza, Nino Glow, MD as PCP - General (Internal Medicine) Bary Castilla, Forest Gleason, MD (General Surgery) Jackolyn Confer, MD (Internal Medicine)   Name of the patient: Paula Garrett  [id number]  1966/03/08   Date of visit: 12/14/21  Diagnosis- smoldering multiple myeloma  Chief complaint/ Reason for visit-routine follow-up of smoldering multiple myeloma  Heme/Onc history:  Patient is a 56 year old female with a past medical history significant for vitamin D deficiency B12 deficiency hypothyroidism and history of gastric bypass.  She was recently seen by Dr. Janit Pagan from endocrinology for her hypothyroidism.  She was found to have a mildly elevated calcium and therefore an SPEP was checked.  SPEP and urine showed IgG lambda light chains.  SPEP showed 2.2 g of M protein serum calcium was mildly elevated at 10.7.  Serum creatinine normal at 0.8.   She is actively trying to lose weight and is on [employer].  She is also taking B12 injections.  Reports mild fatigue but denies other complaints.  Denies any back pain but reports occasional bilateral leg pain   Results of myeloma work-up from 06/20/2020 showed an elevated IgG of 3668.  M protein was elevated at 3.1 and IFE showed IgG monoclonal lambda protein.  Beta-2 microglobulin and LDH were normal.  Serum free light chain ratio was elevated at 25 with a free light chain lambda of 303.  Bone marrow biopsy showed increased plasma cells 22% by manual count and 20% by IHC staining for CD138.  Cytogenetics for myeloma showed gain of 1 q.  PET CT scan was not approved by insurance.  MRI cervical and lumbar thoracic spine did not show any evidence of lytic lesions.  Bone survey was negative for bone lesions as well.  MRI pelvis with and without contrast showed diffuse patchy heterogeneous marrow concerning for  myeloma  Interval history-patient reports some fatigue but denies other complaints at this time.  Denies any new aches and pains at this time.  Appetite and weight have remained stable  ECOG PS- 1 Pain scale- 0 Opioid associated constipation- no  Review of systems- Review of Systems  Constitutional:  Positive for malaise/fatigue. Negative for chills, fever and weight loss.  HENT:  Negative for congestion, ear discharge and nosebleeds.   Eyes:  Negative for blurred vision.  Respiratory:  Negative for cough, hemoptysis, sputum production, shortness of breath and wheezing.   Cardiovascular:  Negative for chest pain, palpitations, orthopnea and claudication.  Gastrointestinal:  Negative for abdominal pain, blood in stool, constipation, diarrhea, heartburn, melena, nausea and vomiting.  Genitourinary:  Negative for dysuria, flank pain, frequency, hematuria and urgency.  Musculoskeletal:  Negative for back pain, joint pain and myalgias.  Skin:  Negative for rash.  Neurological:  Negative for dizziness, tingling, focal weakness, seizures, weakness and headaches.  Endo/Heme/Allergies:  Does not bruise/bleed easily.  Psychiatric/Behavioral:  Negative for depression and suicidal ideas. The patient does not have insomnia.       Allergies  Allergen Reactions   Hctz [Hydrochlorothiazide]     Hyponatremia      Past Medical History:  Diagnosis Date   Anxiety    Arthritis    Asthma    Barrett's esophagus 2015   COVID-19    03/10/20   Depression    GERD (gastroesophageal reflux disease)    Hypertension    Hypothyroidism    Pneumonia    Thyroid disease  Past Surgical History:  Procedure Laterality Date   ABLATION  2006   BREAST CYST ASPIRATION Left 1998   BREAST SURGERY     cyst aspiration   BREAST SURGERY  1998   cyst aspiration   COLONOSCOPY  03/2014   COLONOSCOPY WITH PROPOFOL N/A 01/06/2020   Procedure: COLONOSCOPY WITH PROPOFOL;  Surgeon: Toledo, Benay Pike, MD;   Location: [facility] ENDOSCOPY;  Service: Gastroenterology;  Laterality: N/A;   ENDOMETRIAL ABLATION     ESOPHAGOGASTRODUODENOSCOPY (EGD) WITH PROPOFOL N/A 08/15/2015   Procedure: ESOPHAGOGASTRODUODENOSCOPY (EGD) WITH PROPOFOL;  Surgeon: Lollie Sails, MD;  Location: [facility] ENDOSCOPY;  Service: Endoscopy;  Laterality: N/A;   ESOPHAGOGASTRODUODENOSCOPY (EGD) WITH PROPOFOL N/A 01/22/2016   Procedure: ESOPHAGOGASTRODUODENOSCOPY (EGD) WITH PROPOFOL;  Surgeon: Lollie Sails, MD;  Location: [facility] ENDOSCOPY;  Service: Endoscopy;  Laterality: N/A;   ESOPHAGOGASTRODUODENOSCOPY (EGD) WITH PROPOFOL N/A 01/06/2020   Procedure: ESOPHAGOGASTRODUODENOSCOPY (EGD) WITH PROPOFOL;  Surgeon: Toledo, Benay Pike, MD;  Location: [facility] ENDOSCOPY;  Service: Gastroenterology;  Laterality: N/A;   GASTRIC BYPASS  2005   HERNIA REPAIR     NASAL SINUS SURGERY     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   TOTAL THYROIDECTOMY     TUBAL LIGATION      Social History   Socioeconomic History   Marital status: Divorced    Spouse name: Not on file   Number of children: 2   Years of education: Not on file   Highest education level: High school graduate  Occupational History   Not on file  Tobacco Use   Smoking status: Every Day    Packs/day: 1.00    Years: 30.00    Total pack years: 30.00    Types: Cigarettes    Start date: 03/15/1980   Smokeless tobacco: Never  Vaping Use   Vaping Use: Never used  Substance and Sexual Activity   Alcohol use: Yes    Alcohol/week: 10.0 - 12.0 standard drinks of alcohol    Types: 8 - 10 Glasses of wine, 2 Standard drinks or equivalent per week   Drug use: No   Sexual activity: Yes    Partners: Male  Other Topics Concern   Not on file  Social History Narrative   Lives in [place] single, divorced       Work - 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       Diet - healthy diet   Exercise - limited   Caffeine use: daily   Social Determinants of Health   Financial Resource Strain:  Low Risk  (05/19/2017)   Overall Financial Resource Strain (CARDIA)    Difficulty of Paying Living Expenses: Not hard at all  Food Insecurity: No Food Insecurity (05/19/2017)   Hunger Vital Sign    Worried About Running Out of Food in the Last Year: Never true    Ran Out of Food in the Last Year: Never true  Transportation Needs: No Transportation Needs (05/19/2017)   PRAPARE - Hydrologist (Medical): No    Lack of Transportation (Non-Medical): No  Physical Activity: Sufficiently Active (05/19/2017)   Exercise Vital Sign    Days of Exercise per Week: 4 days    Minutes of Exercise per Session: 40 min  Stress: Stress Concern Present (05/19/2017)   [place]    Feeling of Stress : Very much  Social Connections: Moderately Isolated (05/19/2017)   Social Connection and Isolation Panel [NHANES]  Frequency of Communication with Friends and Family: Three times a week    Frequency of Social Gatherings with Friends and Family: Once a week    Attends Religious Services: Never    Marine scientist or Organizations: No    Attends Archivist Meetings: Never    Marital Status: Divorced  Human resources officer Violence: Not At Risk (05/19/2017)   Humiliation, Afraid, Rape, and Kick questionnaire    Fear of Current or Ex-Partner: No    Emotionally Abused: No    Physically Abused: No    Sexually Abused: No    Family History  Problem Relation Age of Onset   Heart disease Mother    COPD Mother    Arthritis Mother    Cancer Mother        uterine   Lupus Mother    Anxiety disorder Mother    Depression Mother    Drug abuse Mother    COPD Father    Arthritis Father    Heart disease Father    Heart attack Father    Alcohol abuse Father    Diabetes Maternal Grandmother    Diabetes Maternal Grandfather    Diabetes Paternal Grandmother    Diabetes Paternal Grandfather    Heart disease Brother     Heart attack Brother    Drug abuse Brother    Anxiety disorder Brother    Depression Brother    Heart disease Brother    Cancer Brother        liver cancer age 56 y.o   Breast cancer Neg Hx      Current Outpatient Medications:    amLODipine (NORVASC) 5 MG tablet, Take 1 tablet (5 mg total) by mouth daily., Disp: 90 tablet, Rfl: 3   budesonide-formoterol (SYMBICORT) 160-4.5 MCG/ACT inhaler, Inhale 2 puffs into the lungs 2 (two) times daily. Rinse mouth, Disp: 1 each, Rfl: 11   cetirizine (ZYRTEC) 10 MG tablet, Take 1 tablet (10 mg total) by mouth daily., Disp: 90 tablet, Rfl: 3   Cholecalciferol 1.25 MG (50000 UT) capsule, Take 1 capsule (50,000 Units total) by mouth once a week., Disp: 13 capsule, Rfl: 3   clonazePAM (KLONOPIN) 1 MG tablet, TAKE 1 TABLET(1 MG) BY MOUTH TWICE DAILY AS NEEDED FOR ANXIETY, Disp: 60 tablet, Rfl: 2   cyanocobalamin (,VITAMIN B-12,) 1000 MCG/ML injection, INJECT 1 ML INTO THE MUSCLE EVERY 30 DAYS, Disp: 4 mL, Rfl: 12   montelukast (SINGULAIR) 10 MG tablet, Take 1 tablet (10 mg total) by mouth at bedtime., Disp: 90 tablet, Rfl: 3   ondansetron (ZOFRAN) 8 MG tablet, Take 1 tablet (8 mg total) by mouth every 8 (eight) hours as needed for nausea or vomiting., Disp: 60 tablet, Rfl: 0   pantoprazole (PROTONIX) 40 MG tablet, 30 min before lunch or dinner, Disp: 90 tablet, Rfl: 3   Semaglutide-Weight Management (WEGOVY) 0.25 MG/0.5ML SOAJ, Inject 0.25 mg into the skin once a week., Disp: 2 mL, Rfl: 0   Semaglutide-Weight Management (WEGOVY) 0.5 MG/0.5ML SOAJ, Inject 0.5 mg into the skin once a week., Disp: 2 mL, Rfl: 0   Semaglutide-Weight Management (WEGOVY) 1 MG/0.5ML SOAJ, Inject 1 mg into the skin once a week., Disp: 2 mL, Rfl: 0   Semaglutide-Weight Management (WEGOVY) 1.7 MG/0.75ML SOAJ, Inject 1.7 mg into the skin once a week., Disp: 3 mL, Rfl: 0   Semaglutide-Weight Management (WEGOVY) 2.4 MG/0.75ML SOAJ, Inject 2.4 mg into the skin once a week., Disp: 3 mL,  Rfl: 5   SYNTHROID  200 MCG tablet, TAKE 1 TABLET BY MOUTH ONCE daily before breakfast 30 minutes  Further refills from [facility] clinic endocrine Dr. Honor Junes, Marcello Moores no exceptions. Call to make appt endocrine, Disp: 90 tablet, Rfl: 3   SYRINGE-NEEDLE, DISP, 3 ML 25G X 1-1/2" 3 ML MISC, 1 Device by Does not apply route every 30 (thirty) days. With B12 shot, Disp: 12 each, Rfl: 1   telmisartan (MICARDIS) 80 MG tablet, 1 whole pill qd goal BP <130/<80., Disp: 90 tablet, Rfl: 3   vortioxetine HBr (TRINTELLIX) 5 MG TABS tablet, Take 1 tablet (5 mg total) by mouth daily. Failed cymbalta, zoloft, effexor and wellbutrin, Disp: 30 tablet, Rfl: 11  Physical exam:  Vitals:   12/14/21 1000  BP: 126/81  Pulse: 68  Temp: (!) 96.2 F (35.7 C)  TempSrc: Tympanic  SpO2: 96%  Weight: 164 lb 3.2 oz (74.5 kg)  Height: _0  (1.6 m)   Physical Exam Constitutional:      General: She is not in acute distress. Cardiovascular:     Rate and Rhythm: Normal rate and regular rhythm.     Heart sounds: Normal heart sounds.  Pulmonary:     Effort: Pulmonary effort is normal.     Breath sounds: Normal breath sounds.  Abdominal:     General: Bowel sounds are normal.     Palpations: Abdomen is soft.  Skin:    General: Skin is warm and dry.  Neurological:     Mental Status: She is alert and oriented to person, place, and time.         Latest Ref Rng & Units 12/14/2021    9:54 AM  CMP  Glucose 70 - 99 mg/dL 100   BUN 6 - 20 mg/dL 19   Creatinine 0.44 - 1.00 mg/dL 0.53   Sodium 135 - 145 mmol/L 134   Potassium 3.5 - 5.1 mmol/L 4.0   Chloride 98 - 111 mmol/L 101   CO2 22 - 32 mmol/L 26   Calcium 8.9 - 10.3 mg/dL 9.6   Total Protein 6.5 - 8.1 g/dL 8.2   Total Bilirubin 0.3 - 1.2 mg/dL 0.5   Alkaline Phos 38 - 126 U/L 39   AST 15 - 41 U/L 12   ALT 0 - 44 U/L 9       Latest Ref Rng & Units 12/14/2021    9:54 AM  CBC  WBC 4.0 - 10.5 K/uL 5.3   Hemoglobin 12.0 - 15.0 g/dL 11.6   Hematocrit 36.0 -  46.0 % 35.1   Platelets 150 - 400 K/uL 276      Assessment and plan- Patient is a 56 y.o. female who is here for follow-up of following issues:  Smoldering multiple myeloma: Her myeloma panel from March 2023 showed a stable M protein that fluctuates between 2-3.  No clear rising trend.  IgG stable in the 3000's.  Lambda light chain also fluctuates between 2 50-300 with a ratio of around 25 for lambda to kappa.  I will repeat these numbers again in 2 months.  Patient is mildly anemic today with a hemoglobin of 11.5.  Ferritin levels are low at 22.  She has a history of gastric bypass.  I will therefore proceed with 5 doses of Venofer at this time and repeat CBC ferritin and iron studies in 2 months along with myeloma labs.  Discussed risks and benefits of IV iron including all but not limited to possible risk of infusion anaphylactic reaction.  Patient understands and  agrees to proceed as planned   Visit Diagnosis 1. Smoldering multiple myeloma   2. Iron deficiency anemia, unspecified iron deficiency anemia type      Dr. Randa Evens, MD, MPH [facility] at [facility] [phone] 12/14/2021 2:49 PM

## 2021-12-20 ENCOUNTER — Inpatient Hospital Stay: Payer: Managed Care, Other (non HMO) | Attending: Oncology

## 2021-12-20 VITALS — BP 144/78 | HR 97 | Temp 98.4°F | Resp 18

## 2021-12-20 DIAGNOSIS — F17210 Nicotine dependence, cigarettes, uncomplicated: Secondary | ICD-10-CM | POA: Diagnosis not present

## 2021-12-20 DIAGNOSIS — Z9884 Bariatric surgery status: Secondary | ICD-10-CM | POA: Diagnosis not present

## 2021-12-20 DIAGNOSIS — C9000 Multiple myeloma not having achieved remission: Secondary | ICD-10-CM | POA: Insufficient documentation

## 2021-12-20 DIAGNOSIS — D509 Iron deficiency anemia, unspecified: Secondary | ICD-10-CM

## 2021-12-20 DIAGNOSIS — Z8049 Family history of malignant neoplasm of other genital organs: Secondary | ICD-10-CM | POA: Diagnosis not present

## 2021-12-20 DIAGNOSIS — D508 Other iron deficiency anemias: Secondary | ICD-10-CM | POA: Insufficient documentation

## 2021-12-20 DIAGNOSIS — Z800 Family history of malignant neoplasm of digestive organs: Secondary | ICD-10-CM | POA: Insufficient documentation

## 2021-12-20 MED ORDER — SODIUM CHLORIDE 0.9 % IV SOLN
Freq: Once | INTRAVENOUS | Status: AC
  Filled 2021-12-20: qty 250

## 2021-12-20 MED ORDER — SODIUM CHLORIDE 0.9 % IV SOLN
200.0000 mg | INTRAVENOUS | Status: DC
  Administered 2021-12-20: 200 mg via INTRAVENOUS
  Filled 2021-12-20: qty 10

## 2021-12-20 MED FILL — Iron Sucrose Inj 20 MG/ML (Fe Equiv): INTRAVENOUS | Qty: 10 | Status: AC

## 2021-12-20 NOTE — Patient Instructions (Signed)
[facility] AT [place]  Discharge Instructions: Thank you for choosing [facility] to provide your oncology and hematology care.  If you have a lab appointment with the [facility], please go directly to the [facility] and check in at the registration area.  Wear comfortable clothing and clothing appropriate for easy access to any Portacath or PICC line.   We strive to give you quality time with your provider. You may need to reschedule your appointment if you arrive late (15 or more minutes).  Arriving late affects you and other patients whose appointments are after yours.  Also, if you miss three or more appointments without notifying the office, you may be dismissed from the clinic at the provider's discretion.      For prescription refill requests, have your pharmacy contact our office and allow 72 hours for refills to be completed.    Today you received the following chemotherapy and/or immunotherapy agents VENOFER a      To help prevent nausea and vomiting after your treatment, we encourage you to take your nausea medication as directed.  BELOW ARE SYMPTOMS THAT SHOULD BE REPORTED IMMEDIATELY: *FEVER GREATER THAN 100.4 F (38 C) OR HIGHER *CHILLS OR SWEATING *NAUSEA AND VOMITING THAT IS NOT CONTROLLED WITH YOUR NAUSEA MEDICATION *UNUSUAL SHORTNESS OF BREATH *UNUSUAL BRUISING OR BLEEDING *URINARY PROBLEMS (pain or burning when urinating, or frequent urination) *BOWEL PROBLEMS (unusual diarrhea, constipation, pain near the anus) TENDERNESS IN MOUTH AND THROAT WITH OR WITHOUT PRESENCE OF ULCERS (sore throat, sores in mouth, or a toothache) UNUSUAL RASH, SWELLING OR PAIN  UNUSUAL VAGINAL DISCHARGE OR ITCHING   Items with * indicate a potential emergency and should be followed up as soon as possible or go to the Emergency Department if any problems should occur.  Please show the CHEMOTHERAPY ALERT CARD or IMMUNOTHERAPY ALERT CARD at check-in to  the Emergency Department and triage nurse.  Should you have questions after your visit or need to cancel or reschedule your appointment, please contact [facility] [place] AT [place]  [phone] and follow the prompts.  Office hours are 8:00 a.m. to 4:30 p.m. Monday - Friday. Please note that voicemails left after 4:00 p.m. may not be returned until the following business day.  We are closed weekends and major holidays. You have access to a nurse at all times for urgent questions. Please call the main number to the clinic [phone] and follow the prompts.  For any non-urgent questions, you may also contact your provider using MyChart. We now offer e-Visits for anyone 55 and older to request care online for non-urgent symptoms. For details visit [email or web address].   Also download the MyChart app! Go to the app store, search "MyChart", open the app, select [facility], and log in with your MyChart username and password.  Masks are optional in the cancer centers. If you would like for your care team to wear a mask while they are taking care of you, please let them know. For doctor visits, patients may have with them one support person who is at least 58 years old. At this time, visitors are not allowed in the infusion area.  Iron Sucrose Injection What is this medication? IRON SUCROSE (EYE ern SOO krose) treats low levels of iron (iron deficiency anemia) in people with kidney disease. Iron is a mineral that plays an important role in making red blood cells, which carry oxygen from your lungs to the rest of your body. This medicine may  be used for other purposes; ask your health care provider or pharmacist if you have questions. COMMON BRAND NAME(S): Venofer What should I tell my care team before I take this medication? They need to know if you have any of these conditions: Anemia not caused by low iron levels Heart disease High levels of iron in the blood Kidney  disease Liver disease An unusual or allergic reaction to iron, other medications, foods, dyes, or preservatives Pregnant or trying to get pregnant Breast-feeding How should I use this medication? This medication is for infusion into a vein. It is given in a hospital or clinic setting. Talk to your care team about the use of this medication in children. While this medication may be prescribed for children as young as 2 years for selected conditions, precautions do apply. Overdosage: If you think you have taken too much of this medicine contact a poison control center or emergency room at once. NOTE: This medicine is only for you. Do not share this medicine with others. What if I miss a dose? It is important not to miss your dose. Call your care team if you are unable to keep an appointment. What may interact with this medication? Do not take this medication with any of the following: Deferoxamine Dimercaprol Other iron products This medication may also interact with the following: Chloramphenicol Deferasirox This list may not describe all possible interactions. Give your health care provider a list of all the medicines, herbs, non-prescription drugs, or dietary supplements you use. Also tell them if you smoke, drink alcohol, or use illegal drugs. Some items may interact with your medicine. What should I watch for while using this medication? Visit your care team regularly. Tell your care team if your symptoms do not start to get better or if they get worse. You may need blood work done while you are taking this medication. You may need to follow a special diet. Talk to your care team. Foods that contain iron include: whole grains/cereals, dried fruits, beans, or peas, leafy green vegetables, and organ meats (liver, kidney). What side effects may I notice from receiving this medication? Side effects that you should report to your care team as soon as possible: Allergic reactions--skin rash,  itching, hives, swelling of the face, lips, tongue, or throat Low blood pressure--dizziness, feeling faint or lightheaded, blurry vision Shortness of breath Side effects that usually do not require medical attention (report to your care team if they continue or are bothersome): Flushing Headache Joint pain Muscle pain Nausea Pain, redness, or irritation at injection site This list may not describe all possible side effects. Call your doctor for medical advice about side effects. You may report side effects to FDA at 1-800-FDA-1088. Where should I keep my medication? This medication is given in a hospital or clinic and will not be stored at home. NOTE: This sheet is a summary. It may not cover all possible information. If you have questions about this medicine, talk to your doctor, pharmacist, or health care provider.  2023 Elsevier/Gold Standard (2020-10-27 00:00:00)

## 2021-12-27 ENCOUNTER — Inpatient Hospital Stay: Payer: Managed Care, Other (non HMO)

## 2021-12-27 VITALS — BP 118/57 | HR 73 | Temp 96.2°F | Resp 18

## 2021-12-27 DIAGNOSIS — D509 Iron deficiency anemia, unspecified: Secondary | ICD-10-CM

## 2021-12-27 DIAGNOSIS — C9000 Multiple myeloma not having achieved remission: Secondary | ICD-10-CM | POA: Diagnosis not present

## 2021-12-27 MED ORDER — SODIUM CHLORIDE 0.9 % IV SOLN
Freq: Once | INTRAVENOUS | Status: AC
  Filled 2021-12-27: qty 250

## 2021-12-27 MED ORDER — SODIUM CHLORIDE 0.9 % IV SOLN
200.0000 mg | INTRAVENOUS | Status: DC
  Administered 2021-12-27: 200 mg via INTRAVENOUS
  Filled 2021-12-27: qty 10

## 2021-12-27 NOTE — Patient Instructions (Signed)

## 2021-12-31 ENCOUNTER — Inpatient Hospital Stay: Payer: Managed Care, Other (non HMO)

## 2022-01-01 ENCOUNTER — Inpatient Hospital Stay: Payer: Managed Care, Other (non HMO)

## 2022-01-01 MED FILL — Iron Sucrose Inj 20 MG/ML (Fe Equiv): INTRAVENOUS | Qty: 10 | Status: AC

## 2022-01-02 ENCOUNTER — Inpatient Hospital Stay: Payer: Managed Care, Other (non HMO)

## 2022-01-02 VITALS — BP 140/73 | HR 78 | Temp 96.9°F | Resp 18

## 2022-01-02 DIAGNOSIS — C9000 Multiple myeloma not having achieved remission: Secondary | ICD-10-CM | POA: Diagnosis not present

## 2022-01-02 DIAGNOSIS — D509 Iron deficiency anemia, unspecified: Secondary | ICD-10-CM

## 2022-01-02 MED ORDER — SODIUM CHLORIDE 0.9 % IV SOLN
200.0000 mg | INTRAVENOUS | Status: DC
  Administered 2022-01-02: 200 mg via INTRAVENOUS
  Filled 2022-01-02: qty 200

## 2022-01-02 MED ORDER — SODIUM CHLORIDE 0.9 % IV SOLN
Freq: Once | INTRAVENOUS | Status: AC
  Filled 2022-01-02: qty 250

## 2022-01-02 NOTE — Patient Instructions (Signed)

## 2022-01-03 MED FILL — Iron Sucrose Inj 20 MG/ML (Fe Equiv): INTRAVENOUS | Qty: 10 | Status: AC

## 2022-01-04 ENCOUNTER — Inpatient Hospital Stay: Payer: Managed Care, Other (non HMO)

## 2022-01-04 VITALS — BP 129/79 | HR 75 | Temp 100.0°F | Resp 18

## 2022-01-04 DIAGNOSIS — D509 Iron deficiency anemia, unspecified: Secondary | ICD-10-CM

## 2022-01-04 DIAGNOSIS — C9000 Multiple myeloma not having achieved remission: Secondary | ICD-10-CM | POA: Diagnosis not present

## 2022-01-04 MED ORDER — SODIUM CHLORIDE 0.9 % IV SOLN
200.0000 mg | INTRAVENOUS | Status: DC
  Administered 2022-01-04: 200 mg via INTRAVENOUS
  Filled 2022-01-04: qty 200

## 2022-01-04 MED ORDER — SODIUM CHLORIDE 0.9 % IV SOLN
Freq: Once | INTRAVENOUS | Status: AC
  Filled 2022-01-04: qty 250

## 2022-01-04 NOTE — Patient Instructions (Signed)

## 2022-01-07 ENCOUNTER — Inpatient Hospital Stay: Payer: Managed Care, Other (non HMO)

## 2022-01-07 VITALS — BP 142/61 | HR 67 | Temp 98.9°F | Resp 18

## 2022-01-07 DIAGNOSIS — D509 Iron deficiency anemia, unspecified: Secondary | ICD-10-CM

## 2022-01-07 DIAGNOSIS — C9000 Multiple myeloma not having achieved remission: Secondary | ICD-10-CM | POA: Diagnosis not present

## 2022-01-07 MED ORDER — SODIUM CHLORIDE 0.9 % IV SOLN
200.0000 mg | INTRAVENOUS | Status: DC
  Administered 2022-01-07: 200 mg via INTRAVENOUS
  Filled 2022-01-07: qty 10

## 2022-01-07 MED ORDER — SODIUM CHLORIDE 0.9 % IV SOLN
Freq: Once | INTRAVENOUS | Status: AC
  Filled 2022-01-07: qty 250

## 2022-01-08 ENCOUNTER — Other Ambulatory Visit: Payer: Self-pay | Admitting: Internal Medicine

## 2022-01-08 DIAGNOSIS — F419 Anxiety disorder, unspecified: Secondary | ICD-10-CM

## 2022-01-08 DIAGNOSIS — G4700 Insomnia, unspecified: Secondary | ICD-10-CM

## 2022-02-20 ENCOUNTER — Other Ambulatory Visit: Payer: Managed Care, Other (non HMO)

## 2022-02-22 ENCOUNTER — Telehealth: Payer: Managed Care, Other (non HMO) | Admitting: Oncology

## 2022-03-11 ENCOUNTER — Inpatient Hospital Stay: Payer: Managed Care, Other (non HMO) | Attending: Oncology

## 2022-03-11 DIAGNOSIS — C9000 Multiple myeloma not having achieved remission: Secondary | ICD-10-CM | POA: Insufficient documentation

## 2022-03-11 DIAGNOSIS — D472 Monoclonal gammopathy: Secondary | ICD-10-CM

## 2022-03-11 LAB — CBC WITH DIFFERENTIAL/PLATELET
Abs Immature Granulocytes: 0.03 10*3/uL (ref 0.00–0.07)
Basophils Absolute: 0 10*3/uL (ref 0.0–0.1)
Basophils Relative: 0 %
Eosinophils Absolute: 0 10*3/uL (ref 0.0–0.5)
Eosinophils Relative: 0 %
HCT: 40.7 % (ref 36.0–46.0)
Hemoglobin: 14 g/dL (ref 12.0–15.0)
Immature Granulocytes: 0 %
Lymphocytes Relative: 28 %
Lymphs Abs: 2.9 10*3/uL (ref 0.7–4.0)
MCH: 29.7 pg (ref 26.0–34.0)
MCHC: 34.4 g/dL (ref 30.0–36.0)
MCV: 86.2 fL (ref 80.0–100.0)
Monocytes Absolute: 0.7 10*3/uL (ref 0.1–1.0)
Monocytes Relative: 7 %
Neutro Abs: 6.6 10*3/uL (ref 1.7–7.7)
Neutrophils Relative %: 65 %
Platelets: 411 10*3/uL — ABNORMAL HIGH (ref 150–400)
RBC: 4.72 MIL/uL (ref 3.87–5.11)
RDW: 14.8 % (ref 11.5–15.5)
WBC: 10.3 10*3/uL (ref 4.0–10.5)
nRBC: 0 % (ref 0.0–0.2)

## 2022-03-11 LAB — FERRITIN: Ferritin: 98 ng/mL (ref 11–307)

## 2022-03-11 LAB — IRON AND TIBC
Iron: 89 ug/dL (ref 28–170)
Saturation Ratios: 29 % (ref 10.4–31.8)
TIBC: 312 ug/dL (ref 250–450)
UIBC: 223 ug/dL

## 2022-03-12 ENCOUNTER — Other Ambulatory Visit: Payer: Self-pay | Admitting: *Deleted

## 2022-03-12 ENCOUNTER — Encounter: Payer: Self-pay | Admitting: Oncology

## 2022-03-12 ENCOUNTER — Inpatient Hospital Stay (HOSPITAL_BASED_OUTPATIENT_CLINIC_OR_DEPARTMENT_OTHER): Payer: Managed Care, Other (non HMO) | Admitting: Oncology

## 2022-03-12 DIAGNOSIS — D472 Monoclonal gammopathy: Secondary | ICD-10-CM

## 2022-03-12 LAB — KAPPA/LAMBDA LIGHT CHAINS
Kappa free light chain: 12.7 mg/L (ref 3.3–19.4)
Kappa, lambda light chain ratio: 0.03 — ABNORMAL LOW (ref 0.26–1.65)
Lambda free light chains: 490.7 mg/L — ABNORMAL HIGH (ref 5.7–26.3)

## 2022-03-12 NOTE — Progress Notes (Signed)
Pt is in the middle of moving and she was given a medication for anxiety but she does not know what it is. She has pain every day but today because of her packing a moving it is higher than usual but did not want to say a number. She said it would not be fair to give it today because other days she is much better. Because of the move she has some meds and she does not know hte name or how much to take

## 2022-03-14 ENCOUNTER — Other Ambulatory Visit: Payer: Self-pay | Admitting: Internal Medicine

## 2022-03-14 ENCOUNTER — Encounter: Payer: Self-pay | Admitting: *Deleted

## 2022-03-14 ENCOUNTER — Other Ambulatory Visit: Payer: Self-pay

## 2022-03-14 ENCOUNTER — Telehealth: Payer: Self-pay | Admitting: *Deleted

## 2022-03-14 DIAGNOSIS — D472 Monoclonal gammopathy: Secondary | ICD-10-CM

## 2022-03-14 DIAGNOSIS — E538 Deficiency of other specified B group vitamins: Secondary | ICD-10-CM

## 2022-03-14 NOTE — Telephone Encounter (Signed)
I got a secure chat about the pt. And dr [name] wanted to add a lab encounter for 3 months from now. I made the appt and send her my chart message with the new lab appt. Also printed it out and sent it in the mail

## 2022-03-14 NOTE — Progress Notes (Signed)
I connected with Paula Garrett on 03/14/22 at  3:15 PM EDT by video enabled telemedicine visit and verified that I am speaking with the correct person using two identifiers.   I discussed the limitations, risks, security and privacy concerns of performing an evaluation and management service by telemedicine and the availability of in-person appointments. I also discussed with the patient that there may be a patient responsible charge related to this service. The patient expressed understanding and agreed to proceed.  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  She was recently seen by Dr. Janit Pagan from endocrinology for her hypothyroidism.  She was found to have a mildly elevated calcium and therefore an SPEP was checked.  SPEP and urine showed IgG lambda light chains.  SPEP showed 2.2 g of M protein serum calcium was mildly elevated at 10.7.  Serum creatinine normal at 0.8.    Results of myeloma work-up from 06/20/2020 showed an elevated IgG of 3668.  M protein was elevated at 3.1 and IFE showed IgG monoclonal lambda protein.  Beta-2 microglobulin and LDH were normal.  Serum free light chain ratio was elevated at 25 with a free light chain lambda of 303.  Bone marrow biopsy showed increased plasma cells 22% by manual count and 20% by IHC staining for CD138.  Cytogenetics for myeloma showed gain of 1 q.  PET CT scan was not approved by insurance.  MRI cervical and lumbar thoracic spine did not show any evidence of lytic lesions.  Bone survey was negative for bone lesions as well.  MRI pelvis with and without contrast showed diffuse patchy heterogeneous marrow concerning  for myeloma  Interval history reports occasional cough and fatigue.  Denies any new aches and pains anywhere.   Review of Systems  Constitutional:  Positive for malaise/fatigue. Negative for chills, fever and weight loss.  HENT:  Negative for congestion, ear discharge and nosebleeds.   Eyes:  Negative for blurred vision.  Respiratory:  Positive for cough. Negative for hemoptysis, sputum production, shortness of breath and wheezing.   Cardiovascular:  Negative for chest pain, palpitations, orthopnea and claudication.  Gastrointestinal:  Negative for abdominal pain, blood in stool, constipation, diarrhea, heartburn, melena, nausea and vomiting.  Genitourinary:  Negative for dysuria, flank pain, frequency, hematuria and urgency.  Musculoskeletal:  Negative for back pain, joint pain and myalgias.  Skin:  Negative for rash.  Neurological:  Negative for dizziness, tingling, focal weakness, seizures, weakness and headaches.  Endo/Heme/Allergies:  Does not bruise/bleed easily.  Psychiatric/Behavioral:  Negative for depression and suicidal ideas. The patient does not have insomnia.     Allergies  Allergen Reactions   Hctz [Hydrochlorothiazide]     Hyponatremia     Past Medical History:  Diagnosis Date   Anxiety    Arthritis    Asthma    Barrett's esophagus 2015   COVID-19    03/10/20   Depression    GERD (gastroesophageal reflux disease)    Hypertension    Hypothyroidism    Pneumonia    Thyroid disease     Past Surgical History:  Procedure Laterality Date   ABLATION  2006   BREAST CYST ASPIRATION Left 1998  BREAST SURGERY     cyst aspiration   BREAST SURGERY  1998   cyst aspiration   COLONOSCOPY  03/2014   COLONOSCOPY WITH PROPOFOL N/A 01/06/2020   Procedure: COLONOSCOPY WITH PROPOFOL;  Surgeon: Toledo, Benay Pike, MD;  Location: [facility] ENDOSCOPY;  Service: Gastroenterology;  Laterality: N/A;   ENDOMETRIAL ABLATION     ESOPHAGOGASTRODUODENOSCOPY (EGD) WITH PROPOFOL N/A  08/15/2015   Procedure: ESOPHAGOGASTRODUODENOSCOPY (EGD) WITH PROPOFOL;  Surgeon: Lollie Sails, MD;  Location: [facility] ENDOSCOPY;  Service: Endoscopy;  Laterality: N/A;   ESOPHAGOGASTRODUODENOSCOPY (EGD) WITH PROPOFOL N/A 01/22/2016   Procedure: ESOPHAGOGASTRODUODENOSCOPY (EGD) WITH PROPOFOL;  Surgeon: Lollie Sails, MD;  Location: [facility] ENDOSCOPY;  Service: Endoscopy;  Laterality: N/A;   ESOPHAGOGASTRODUODENOSCOPY (EGD) WITH PROPOFOL N/A 01/06/2020   Procedure: ESOPHAGOGASTRODUODENOSCOPY (EGD) WITH PROPOFOL;  Surgeon: Toledo, Benay Pike, MD;  Location: [facility] ENDOSCOPY;  Service: Gastroenterology;  Laterality: N/A;   GASTRIC BYPASS  2005   HERNIA REPAIR     NASAL SINUS SURGERY     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   TOTAL THYROIDECTOMY     TUBAL LIGATION      Social History   Socioeconomic History   Marital status: Divorced    Spouse name: Not on file   Number of children: 2   Years of education: Not on file   Highest education level: High school graduate  Occupational History   Not on file  Tobacco Use   Smoking status: Every Day    Packs/day: 1.00    Years: 30.00    Total pack years: 30.00    Types: Cigarettes    Start date: 03/15/1980   Smokeless tobacco: Never  Vaping Use   Vaping Use: Never used  Substance and Sexual Activity   Alcohol use: Yes    Alcohol/week: 10.0 - 12.0 standard drinks of alcohol    Types: 8 - 10 Glasses of wine, 2 Standard drinks or equivalent per week   Drug use: No   Sexual activity: Yes    Partners: Male  Other Topics Concern   Not on file  Social History Narrative   Lives in [place] single, divorced       Work - 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       Diet - healthy diet   Exercise - limited   Caffeine use: daily   Social Determinants of Health   Financial Resource Strain: Low Risk  (05/19/2017)   Overall Financial Resource Strain (CARDIA)    Difficulty of Paying Living Expenses: Not hard at all  Food Insecurity: No Food  Insecurity (05/19/2017)   Hunger Vital Sign    Worried About Running Out of Food in the Last Year: Never true    Ran Out of Food in the Last Year: Never true  Transportation Needs: No Transportation Needs (05/19/2017)   PRAPARE - Hydrologist (Medical): No    Lack of Transportation (Non-Medical): No  Physical Activity: Sufficiently Active (05/19/2017)   Exercise Vital Sign    Days of Exercise per Week: 4 days    Minutes of Exercise per Session: 40 min  Stress: Stress Concern Present (05/19/2017)   [place]    Feeling of Stress : Very much  Social Connections: Moderately Isolated (05/19/2017)   Social Connection and Isolation Panel [NHANES]    Frequency of Communication with Friends and Family: Three times a week    Frequency of Social Gatherings with  Friends and Family: Once a week    Attends Religious Services: Never    Marine scientist or Organizations: No    Attends Archivist Meetings: Never    Marital Status: Divorced  Human resources officer Violence: Not At Risk (05/19/2017)   Humiliation, Afraid, Rape, and Kick questionnaire    Fear of Current or Ex-Partner: No    Emotionally Abused: No    Physically Abused: No    Sexually Abused: No    Family History  Problem Relation Age of Onset   Heart disease Mother    COPD Mother    Arthritis Mother    Cancer Mother        uterine   Lupus Mother    Anxiety disorder Mother    Depression Mother    Drug abuse Mother    COPD Father    Arthritis Father    Heart disease Father    Heart attack Father    Alcohol abuse Father    Diabetes Maternal Grandmother    Diabetes Maternal Grandfather    Diabetes Paternal Grandmother    Diabetes Paternal Grandfather    Heart disease Brother    Heart attack Brother    Drug abuse Brother    Anxiety disorder Brother    Depression Brother    Heart disease Brother    Cancer Brother         liver cancer age 50 y.o   Breast cancer Neg Hx      Current Outpatient Medications:    amLODipine (NORVASC) 5 MG tablet, Take 1 tablet (5 mg total) by mouth daily., Disp: 90 tablet, Rfl: 3   budesonide-formoterol (SYMBICORT) 160-4.5 MCG/ACT inhaler, Inhale 2 puffs into the lungs 2 (two) times daily. Rinse mouth, Disp: 1 each, Rfl: 11   cetirizine (ZYRTEC) 10 MG tablet, Take 1 tablet (10 mg total) by mouth daily., Disp: 90 tablet, Rfl: 3   clonazePAM (KLONOPIN) 1 MG tablet, Take 1 mg by mouth 2 (two) times daily as needed for anxiety., Disp: , Rfl:    cyanocobalamin (,VITAMIN B-12,) 1000 MCG/ML injection, INJECT 1 ML INTO THE MUSCLE EVERY 30 DAYS, Disp: 4 mL, Rfl: 12   levothyroxine (SYNTHROID) 112 MCG tablet, Take 224 mcg by mouth daily before breakfast. Pt states that she has to take 2 pills a day to get 224 mg, Disp: , Rfl:    montelukast (SINGULAIR) 10 MG tablet, Take 1 tablet (10 mg total) by mouth at bedtime., Disp: 90 tablet, Rfl: 3   ondansetron (ZOFRAN) 8 MG tablet, Take 1 tablet (8 mg total) by mouth every 8 (eight) hours as needed for nausea or vomiting., Disp: 60 tablet, Rfl: 0   pantoprazole (PROTONIX) 40 MG tablet, 30 min before lunch or dinner, Disp: 90 tablet, Rfl: 3   SYRINGE-NEEDLE, DISP, 3 ML 25G X 1-1/2" 3 ML MISC, 1 Device by Does not apply route every 30 (thirty) days. With B12 shot, Disp: 12 each, Rfl: 1   telmisartan (MICARDIS) 80 MG tablet, 1 whole pill qd goal BP <130/<80., Disp: 90 tablet, Rfl: 3  No results found.  No images are attached to the encounter.      Latest Ref Rng & Units 12/14/2021    9:54 AM  CMP  Glucose 70 - 99 mg/dL 100   BUN 6 - 20 mg/dL 19   Creatinine 0.44 - 1.00 mg/dL 0.53   Sodium 135 - 145 mmol/L 134   Potassium 3.5 - 5.1 mmol/L 4.0   Chloride 98 -  111 mmol/L 101   CO2 22 - 32 mmol/L 26   Calcium 8.9 - 10.3 mg/dL 9.6   Total Protein 6.5 - 8.1 g/dL 8.2   Total Bilirubin 0.3 - 1.2 mg/dL 0.5   Alkaline Phos 38 - 126 U/L 39   AST 15 -  41 U/L 12   ALT 0 - 44 U/L 9       Latest Ref Rng & Units 03/11/2022    1:23 PM  CBC  WBC 4.0 - 10.5 K/uL 10.3   Hemoglobin 12.0 - 15.0 g/dL 14.0   Hematocrit 36.0 - 46.0 % 40.7   Platelets 150 - 400 K/uL 411      Observation/objective: Appears in no acute distress over video visit today.  Breathing is nonlabored  Assessment and plan: Patient is a 31 year oldfemale with history of IgG lambda smoldering multiple myeloma here for routine follow-up  Patient does not presently have any crab criteria.  Hemoglobin is normal at 14.  Most recent BMP showed a normal creatinine of 0.8 and calcium level of 10.3.  Free light chain was not back at the time of my visit but did come back at lambda light chain of 490 which was elevated as compared to 275 before.  Myeloma panel shows M protein of 2.9 which is somewhat stable as compared to her prior values which have fluctuated between 2.7-3.2.  However in light of increasing free light chains I will continue to monitor her myeloma numbers every 3 months and I will see her back in 6 months.  I will also repeat her bone survey in about 6 months.  If free light chain ratio is higher at that time of 3 months she may require a PET scan or repeat bone marrow biopsy to rule out progression to overt multiple myeloma.  Follow-up instructions: As above  I discussed the assessment and treatment plan with the patient. The patient was provided an opportunity to ask questions and all were answered. The patient agreed with the plan and demonstrated an understanding of the instructions.   The patient was advised to call back or seek an in-person evaluation if the symptoms worsen or if the condition fails to improve as anticipated.  I provided 15 minutes of face-to-face video visit time during this encounter, and > 50% was spent counseling as documented under my assessment & plan.  Visit Diagnosis: 1. Smoldering multiple myeloma     Dr. Randa Evens, MD, MPH [facility] at  [facility] Tel- [phone] 03/14/2022 12:52 PM

## 2022-03-15 LAB — MULTIPLE MYELOMA PANEL, SERUM
Albumin SerPl Elph-Mcnc: 3.4 g/dL (ref 2.9–4.4)
Albumin/Glob SerPl: 0.7 (ref 0.7–1.7)
Alpha 1: 0.3 g/dL (ref 0.0–0.4)
Alpha2 Glob SerPl Elph-Mcnc: 0.8 g/dL (ref 0.4–1.0)
B-Globulin SerPl Elph-Mcnc: 3.8 g/dL — ABNORMAL HIGH (ref 0.7–1.3)
Gamma Glob SerPl Elph-Mcnc: 0.3 g/dL — ABNORMAL LOW (ref 0.4–1.8)
Globulin, Total: 5.2 g/dL — ABNORMAL HIGH (ref 2.2–3.9)
IgA: 22 mg/dL — ABNORMAL LOW (ref 87–352)
IgG (Immunoglobin G), Serum: 3834 mg/dL — ABNORMAL HIGH (ref 586–1602)
IgM (Immunoglobulin M), Srm: 48 mg/dL (ref 26–217)
M Protein SerPl Elph-Mcnc: 2.9 g/dL — ABNORMAL HIGH
Total Protein ELP: 8.6 g/dL — ABNORMAL HIGH (ref 6.0–8.5)

## 2022-03-18 ENCOUNTER — Encounter: Payer: Self-pay | Admitting: Internal Medicine

## 2022-03-18 ENCOUNTER — Ambulatory Visit: Payer: Managed Care, Other (non HMO) | Admitting: Internal Medicine

## 2022-03-18 VITALS — BP 140/78 | HR 78 | Temp 98.1°F | Ht 63.0 in | Wt 158.8 lb

## 2022-03-18 DIAGNOSIS — Z23 Encounter for immunization: Secondary | ICD-10-CM

## 2022-03-18 DIAGNOSIS — Z0000 Encounter for general adult medical examination without abnormal findings: Secondary | ICD-10-CM

## 2022-03-18 DIAGNOSIS — D472 Monoclonal gammopathy: Secondary | ICD-10-CM

## 2022-03-18 DIAGNOSIS — E538 Deficiency of other specified B group vitamins: Secondary | ICD-10-CM | POA: Diagnosis not present

## 2022-03-18 DIAGNOSIS — J40 Bronchitis, not specified as acute or chronic: Secondary | ICD-10-CM | POA: Diagnosis not present

## 2022-03-18 DIAGNOSIS — Z0001 Encounter for general adult medical examination with abnormal findings: Secondary | ICD-10-CM | POA: Diagnosis not present

## 2022-03-18 DIAGNOSIS — J309 Allergic rhinitis, unspecified: Secondary | ICD-10-CM

## 2022-03-18 DIAGNOSIS — E039 Hypothyroidism, unspecified: Secondary | ICD-10-CM

## 2022-03-18 DIAGNOSIS — R7303 Prediabetes: Secondary | ICD-10-CM

## 2022-03-18 DIAGNOSIS — M5136 Other intervertebral disc degeneration, lumbar region: Secondary | ICD-10-CM | POA: Diagnosis not present

## 2022-03-18 DIAGNOSIS — R053 Chronic cough: Secondary | ICD-10-CM

## 2022-03-18 DIAGNOSIS — I10 Essential (primary) hypertension: Secondary | ICD-10-CM

## 2022-03-18 DIAGNOSIS — K219 Gastro-esophageal reflux disease without esophagitis: Secondary | ICD-10-CM

## 2022-03-18 MED ORDER — METHYLPREDNISOLONE 4 MG PO TBPK: ORAL_TABLET | ORAL | 0 refills | Status: DC

## 2022-03-18 MED ORDER — CYCLOBENZAPRINE HCL 5 MG PO TABS: 5.0000 mg | ORAL_TABLET | Freq: Every evening | ORAL | 1 refills | Status: DC | PRN

## 2022-03-18 MED ORDER — CYANOCOBALAMIN 1000 MCG/ML IJ SOLN
1000.0000 ug | Freq: Once | INTRAMUSCULAR | Status: AC
  Administered 2022-03-18: 1000 ug via INTRAMUSCULAR

## 2022-03-18 MED ORDER — BUDESONIDE-FORMOTEROL FUMARATE 160-4.5 MCG/ACT IN AERO: 2.0000 | INHALATION_SPRAY | Freq: Two times a day (BID) | RESPIRATORY_TRACT | 11 refills | Status: DC

## 2022-03-18 MED ORDER — AZITHROMYCIN 250 MG PO TABS: ORAL_TABLET | ORAL | 0 refills | Status: AC

## 2022-03-18 NOTE — Progress Notes (Signed)
Pt was administered the vitamin B12 injection in the right deltoid. Pt tolerated the shot well.   [place], CMA

## 2022-03-18 NOTE — Patient Instructions (Addendum)
Mucinex for high blood pressure   Upper endoscopy due 12/2022   Pending bone survey scan   Covid shot due 2 weeks   Then   Pneumococcal Conjugate Vaccine (Prevnar 20) Suspension for Injection  Then   Tdap 04/10/22 due  What is this medication? PNEUMOCOCCAL VACCINE (NEU mo KOK al vak SEEN) is a vaccine. It prevents pneumococcus bacterial infections. These bacteria can cause serious infections like pneumonia, meningitis, and blood infections. This vaccine will not treat an infection and will not cause infection. This vaccine is recommended for adults 18 years and older. This medicine may be used for other purposes; ask your health care provider or pharmacist if you have questions. COMMON BRAND NAME(S): Prevnar 20 What should I tell my care team before I take this medication? They need to know if you have any of these conditions: bleeding disorder fever immune system problems an unusual or allergic reaction to pneumococcal vaccine, diphtheria toxoid, other vaccines, other medicines, foods, dyes, or preservatives pregnant or trying to get pregnant breast-feeding How should I use this medication? This vaccine is injected into a muscle. It is given by a health care provider. A copy of Vaccine Information Statements will be given before each vaccination. Be sure to read this information carefully each time. This sheet may change often. Talk to your health care provider about the use of this medicine in children. Special care may be needed. Overdosage: If you think you have taken too much of this medicine contact a poison control center or emergency room at once. NOTE: This medicine is only for you. Do not share this medicine with others. What if I miss a dose? This does not apply. This medicine is not for regular use. What may interact with this medication? medicines for cancer chemotherapy medicines that suppress your immune function steroid medicines like prednisone or cortisone This  list may not describe all possible interactions. Give your health care provider a list of all the medicines, herbs, non-prescription drugs, or dietary supplements you use. Also tell them if you smoke, drink alcohol, or use illegal drugs. Some items may interact with your medicine. What should I watch for while using this medication? Mild fever and pain should go away in 3 days or less. Report any unusual symptoms to your health care provider. What side effects may I notice from receiving this medication? Side effects that you should report to your doctor or health care professional as soon as possible: allergic reactions (skin rash, itching or hives; swelling of the face, lips, or tongue) confusion fast, irregular heartbeat fever over 102 degrees F muscle weakness seizures trouble breathing unusual bruising or bleeding Side effects that usually do not require medical attention (report to your doctor or health care professional if they continue or are bothersome): fever of 102 degrees F or less headache joint pain muscle cramps, pain pain, tender at site where injected This list may not describe all possible side effects. Call your doctor for medical advice about side effects. You may report side effects to FDA at 1-800-FDA-1088. Where should I keep my medication? This vaccine is only given by a health care provider. It will not be stored at home. NOTE: This sheet is a summary. It may not cover all possible information. If you have questions about this medicine, talk to your doctor, pharmacist, or health care provider.  2023 Elsevier/Gold Standard (2020-02-04 00:00:00)

## 2022-03-18 NOTE — Progress Notes (Unsigned)
Chief Complaint  Patient presents with   Follow-up    F/u pt would also like the flu shot and a b12 shot   HPI ROS Past Medical History:  Diagnosis Date   Anxiety    Arthritis    Asthma    Barrett's esophagus 2015   COVID-19    03/10/20   Depression    GERD (gastroesophageal reflux disease)    Hypertension    Hypothyroidism    Pneumonia    Thyroid disease    Past Surgical History:  Procedure Laterality Date   ABLATION  2006   BREAST CYST ASPIRATION Left 1998   BREAST SURGERY     cyst aspiration   BREAST SURGERY  1998   cyst aspiration   COLONOSCOPY  03/2014   COLONOSCOPY WITH PROPOFOL N/A 01/06/2020   Procedure: COLONOSCOPY WITH PROPOFOL;  Surgeon: Toledo, Benay Pike, MD;  Location: [facility] ENDOSCOPY;  Service: Gastroenterology;  Laterality: N/A;   ENDOMETRIAL ABLATION     ESOPHAGOGASTRODUODENOSCOPY (EGD) WITH PROPOFOL N/A 08/15/2015   Procedure: ESOPHAGOGASTRODUODENOSCOPY (EGD) WITH PROPOFOL;  Surgeon: Lollie Sails, MD;  Location: [facility] ENDOSCOPY;  Service: Endoscopy;  Laterality: N/A;   ESOPHAGOGASTRODUODENOSCOPY (EGD) WITH PROPOFOL N/A 01/22/2016   Procedure: ESOPHAGOGASTRODUODENOSCOPY (EGD) WITH PROPOFOL;  Surgeon: Lollie Sails, MD;  Location: [facility] ENDOSCOPY;  Service: Endoscopy;  Laterality: N/A;   ESOPHAGOGASTRODUODENOSCOPY (EGD) WITH PROPOFOL N/A 01/06/2020   Procedure: ESOPHAGOGASTRODUODENOSCOPY (EGD) WITH PROPOFOL;  Surgeon: Toledo, Benay Pike, MD;  Location: [facility] ENDOSCOPY;  Service: Gastroenterology;  Laterality: N/A;   GASTRIC BYPASS  2005   HERNIA REPAIR     NASAL SINUS SURGERY     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   TOTAL THYROIDECTOMY     TUBAL LIGATION     Family History  Problem Relation Age of Onset   Heart disease Mother    COPD Mother    Arthritis Mother    Cancer Mother        uterine   Lupus Mother    Anxiety disorder Mother    Depression Mother    Drug abuse Mother    COPD Father    Arthritis Father    Heart disease Father     Heart attack Father    Alcohol abuse Father    Diabetes Maternal Grandmother    Diabetes Maternal Grandfather    Diabetes Paternal Grandmother    Diabetes Paternal Grandfather    Heart disease Brother    Heart attack Brother    Drug abuse Brother    Anxiety disorder Brother    Depression Brother    Heart disease Brother    Cancer Brother        liver cancer age 96 y.o   Breast cancer Neg Hx    Social History   Socioeconomic History   Marital status: Divorced    Spouse name: Not on file   Number of children: 2   Years of education: Not on file   Highest education level: High school graduate  Occupational History   Not on file  Tobacco Use   Smoking status: Every Day    Packs/day: 1.00    Years: 30.00    Total pack years: 30.00    Types: Cigarettes    Start date: 03/15/1980   Smokeless tobacco: Never  Vaping Use   Vaping Use: Never used  Substance and Sexual Activity   Alcohol use: Yes    Alcohol/week: 10.0 - 12.0 standard drinks of alcohol    Types:  8 - 10 Glasses of wine, 2 Standard drinks or equivalent per week   Drug use: No   Sexual activity: Yes    Partners: Male  Other Topics Concern   Not on file  Social History Narrative   Lives in [place] single, divorced       Work - 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       Diet - healthy diet   Exercise - limited   Caffeine use: daily   Social Determinants of Health   Financial Resource Strain: Low Risk  (05/19/2017)   Overall Financial Resource Strain (CARDIA)    Difficulty of Paying Living Expenses: Not hard at all  Food Insecurity: No Food Insecurity (05/19/2017)   Hunger Vital Sign    Worried About Running Out of Food in the Last Year: Never true    [place] in the Last Year: Never true  Transportation Needs: No Transportation Needs (05/19/2017)   PRAPARE - Hydrologist (Medical): No    Lack of Transportation (Non-Medical): No  Physical Activity: Sufficiently Active  (05/19/2017)   Exercise Vital Sign    Days of Exercise per Week: 4 days    Minutes of Exercise per Session: 40 min  Stress: Stress Concern Present (05/19/2017)   [place]    Feeling of Stress : Very much  Social Connections: Moderately Isolated (05/19/2017)   Social Connection and Isolation Panel [NHANES]    Frequency of Communication with Friends and Family: Three times a week    Frequency of Social Gatherings with Friends and Family: Once a week    Attends Religious Services: Never    Marine scientist or Organizations: No    Attends Archivist Meetings: Never    Marital Status: Divorced  Human resources officer Violence: Not At Risk (05/19/2017)   Humiliation, Afraid, Rape, and Kick questionnaire    Fear of Current or Ex-Partner: No    Emotionally Abused: No    Physically Abused: No    Sexually Abused: No   Current Meds  Medication Sig   amLODipine (NORVASC) 5 MG tablet Take 1 tablet (5 mg total) by mouth daily.   budesonide-formoterol (SYMBICORT) 160-4.5 MCG/ACT inhaler Inhale 2 puffs into the lungs 2 (two) times daily. Rinse mouth   cetirizine (ZYRTEC) 10 MG tablet Take 1 tablet (10 mg total) by mouth daily.   clonazePAM (KLONOPIN) 1 MG tablet Take 1 mg by mouth 2 (two) times daily as needed for anxiety.   cyanocobalamin (VITAMIN B12) 1000 MCG/ML injection INJECT 1 ML IN THE MUSCLE EVERY 30 DAYS   levothyroxine (SYNTHROID) 112 MCG tablet Take 224 mcg by mouth daily before breakfast. Pt states that she has to take 2 pills a day to get 224 mg   montelukast (SINGULAIR) 10 MG tablet Take 1 tablet (10 mg total) by mouth at bedtime.   ondansetron (ZOFRAN) 8 MG tablet Take 1 tablet (8 mg total) by mouth every 8 (eight) hours as needed for nausea or vomiting.   pantoprazole (PROTONIX) 40 MG tablet 30 min before lunch or dinner   SYRINGE-NEEDLE, DISP, 3 ML 25G X 1-1/2" 3 ML MISC 1 Device by Does not apply route every  30 (thirty) days. With B12 shot   telmisartan (MICARDIS) 80 MG tablet 1 whole pill qd goal BP <130/<80.   Allergies  Allergen Reactions   Hctz [Hydrochlorothiazide]     Hyponatremia    Recent Results (  from the past 2160 hour(s))  Kappa/lambda light chains     Status: Abnormal   Collection Time: 03/11/22  1:23 PM  Result Value Ref Range   Kappa free light chain 12.7 3.3 - 19.4 mg/L   Lambda free light chains 490.7 (H) 5.7 - 26.3 mg/L   Kappa, lambda light chain ratio 0.03 (L) 0.26 - 1.65    Comment: (NOTE) Performed At: [facility] [address] [place], [place] [id number] Rush Farmer MD [id number]   Ferritin     Status: None   Collection Time: 03/11/22  1:23 PM  Result Value Ref Range   Ferritin 98 11 - 307 ng/mL    Comment: Performed at [facility], [address] [place], [place] [address]  Iron and TIBC     Status: None   Collection Time: 03/11/22  1:23 PM  Result Value Ref Range   Iron 89 28 - 170 ug/dL   TIBC 312 250 - 450 ug/dL   Saturation Ratios 29 10.4 - 31.8 %   UIBC 223 ug/dL    Comment: Performed at [facility] [address] [place], [place] [address]  CBC with Differential/Platelet     Status: Abnormal   Collection Time: 03/11/22  1:23 PM  Result Value Ref Range   WBC 10.3 4.0 - 10.5 K/uL   RBC 4.72 3.87 - 5.11 MIL/uL   Hemoglobin 14.0 12.0 - 15.0 g/dL   HCT 40.7 36.0 - 46.0 %   MCV 86.2 80.0 - 100.0 fL   MCH 29.7 26.0 - 34.0 pg   MCHC 34.4 30.0 - 36.0 g/dL   RDW 14.8 11.5 - 15.5 %   Platelets 411 (H) 150 - 400 K/uL   nRBC 0.0 0.0 - 0.2 %   Neutrophils Relative % 65 %   Neutro Abs 6.6 1.7 - 7.7 K/uL   Lymphocytes Relative 28 %   Lymphs Abs 2.9 0.7 - 4.0 K/uL   Monocytes Relative 7 %   Monocytes Absolute 0.7 0.1 - 1.0 K/uL   Eosinophils Relative 0 %   Eosinophils Absolute 0.0 0.0 - 0.5 K/uL   Basophils Relative 0 %   Basophils Absolute 0.0 0.0 - 0.1 K/uL   Immature Granulocytes 0 %   Abs Immature  Granulocytes 0.03 0.00 - 0.07 K/uL    Comment: Performed at [facility], [address] [place], [place] [address]  Multiple Myeloma Panel (SPEP&IFE w/QIG)     Status: Abnormal   Collection Time: 03/11/22  1:24 PM  Result Value Ref Range   IgG (Immunoglobin G), Serum 3,834 (H) 586 - 1,602 mg/dL   IgA 22 (L) 87 - 352 mg/dL    Comment: Result confirmed on concentration.   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  Please Note Comment     Comment: (NOTE) Protein electrophoresis scan will follow via computer, mail, or courier delivery. Performed At: [facility] [place], [place] [id number] Rush Farmer MD MV:[id number]    Objective  Body mass index is 28.13 kg/m. Wt Readings from Last 3 Encounters:  03/18/22 158 lb 12.8 oz (72 kg)  12/14/21 164 lb 3.2 oz (74.5 kg)  10/25/21 173 lb 3.2 oz (78.6 kg)   Temp Readings from Last 3 Encounters:  03/18/22 98.1 F (36.7 C) (Oral)  01/07/22 98.9 F (37.2 C)  01/04/22 100 F (37.8 C) (Tympanic)   BP Readings from Last 3 Encounters:  03/18/22 (!) 140/78  01/07/22 (!) 142/61  01/04/22 129/79   Pulse Readings from Last 3 Encounters:  03/18/22 78  01/07/22 67  01/04/22 75    Physical Exam  Assessment  Plan  No diagnosis found.  HM Flu shot utd pna 23 had 02/02/20  Consider prevnar 13   covid shot had 3/3 pfizer covid 03/10/20 +  consider 4th dose  Consider shingrix  Tdap UTD 04/10/12 rec hep B vaccine for future (consider new x 2 doses) Consider MMR in future    mammo  07/28/19 neg, ordered 04/09/21 neg ordered 03/2022   Pap neg 11/20/17 no HPV  11/21/20 neg neg HPV   EGD/colonoscopy had [facility] , need to get report colonoscopy 03/26/14 [facility] Also EGD due 01/22/2019 barretts last 01/21/14 no dysplasia  EGD/colonoscopy [facility] 01/06/20 Dr. Alice Reichert EGD/Colonoscopy barretts neg bx f/u in 3 years egd and 5 years colonoscopy    Skin 02/02/20 no issues    rec smoking cesasation and exercise to lose with healthy diet choices waist 46.5 in today   F/u [facility] Endocrine Dr. Honor Junes m      Provider: Dr. Olivia Mackie McLean-Scocuzza-Internal Medicine

## 2022-03-20 MED ORDER — CETIRIZINE HCL 10 MG PO TABS: 10.0000 mg | ORAL_TABLET | Freq: Every day | ORAL | 3 refills | Status: DC

## 2022-03-20 MED ORDER — MONTELUKAST SODIUM 10 MG PO TABS: 10.0000 mg | ORAL_TABLET | Freq: Every day | ORAL | 3 refills | Status: DC

## 2022-03-20 MED ORDER — TELMISARTAN 80 MG PO TABS: ORAL_TABLET | ORAL | 3 refills | Status: DC

## 2022-03-20 MED ORDER — PANTOPRAZOLE SODIUM 40 MG PO TBEC: DELAYED_RELEASE_TABLET | ORAL | 3 refills | Status: AC

## 2022-03-20 MED ORDER — AMLODIPINE BESYLATE 5 MG PO TABS: 5.0000 mg | ORAL_TABLET | Freq: Every day | ORAL | 3 refills | Status: DC

## 2022-03-20 MED ORDER — ALBUTEROL SULFATE HFA 108 (90 BASE) MCG/ACT IN AERS: 2.0000 | INHALATION_SPRAY | Freq: Four times a day (QID) | RESPIRATORY_TRACT | 11 refills | Status: DC | PRN

## 2022-03-21 ENCOUNTER — Encounter: Payer: Self-pay | Admitting: Oncology

## 2022-03-21 NOTE — Addendum Note (Signed)
Addended by: Orland Mustard on: 03/21/2022 05:41 PM   Modules accepted: Level of Service

## 2022-03-26 ENCOUNTER — Ambulatory Visit: Admission: RE | Admit: 2022-03-26 | Payer: Managed Care, Other (non HMO) | Source: Ambulatory Visit

## 2022-04-22 ENCOUNTER — Other Ambulatory Visit: Payer: Self-pay | Admitting: Family

## 2022-04-22 ENCOUNTER — Telehealth: Payer: Self-pay | Admitting: Internal Medicine

## 2022-04-22 MED ORDER — CLONAZEPAM 1 MG PO TABS: 1.0000 mg | ORAL_TABLET | Freq: Two times a day (BID) | ORAL | 0 refills | Status: DC | PRN

## 2022-04-22 NOTE — Telephone Encounter (Signed)
Refill clonazePAM clonazePAM (KLONOPIN) 1 MG tablet Does the patient need a follow up . She has been without her medication for 6 days.

## 2022-05-08 ENCOUNTER — Ambulatory Visit: Payer: Managed Care, Other (non HMO) | Admitting: Nurse Practitioner

## 2022-05-16 ENCOUNTER — Other Ambulatory Visit: Payer: Self-pay | Admitting: Oncology

## 2022-06-13 ENCOUNTER — Inpatient Hospital Stay: Payer: Managed Care, Other (non HMO) | Attending: Oncology

## 2022-06-13 DIAGNOSIS — D472 Monoclonal gammopathy: Secondary | ICD-10-CM | POA: Diagnosis present

## 2022-06-13 LAB — COMPREHENSIVE METABOLIC PANEL
ALT: 16 U/L (ref 0–44)
AST: 17 U/L (ref 15–41)
Albumin: 2.9 g/dL — ABNORMAL LOW (ref 3.5–5.0)
Alkaline Phosphatase: 50 U/L (ref 38–126)
Anion gap: 12 (ref 5–15)
BUN: 13 mg/dL (ref 6–20)
CO2: 25 mmol/L (ref 22–32)
Calcium: 9.7 mg/dL (ref 8.9–10.3)
Chloride: 96 mmol/L — ABNORMAL LOW (ref 98–111)
Creatinine, Ser: 0.88 mg/dL (ref 0.44–1.00)
GFR, Estimated: >60 mL/min (ref >60)
Glucose, Bld: 117 mg/dL — ABNORMAL HIGH (ref 70–99)
Potassium: 3.8 mmol/L (ref 3.5–5.1)
Sodium: 133 mmol/L — ABNORMAL LOW (ref 135–145)
Total Bilirubin: 0.6 mg/dL (ref 0.3–1.2)
Total Protein: 8.9 g/dL — ABNORMAL HIGH (ref 6.5–8.1)

## 2022-06-13 LAB — CBC WITH DIFFERENTIAL/PLATELET
Abs Immature Granulocytes: 0.03 10*3/uL (ref 0.00–0.07)
Basophils Absolute: 0 10*3/uL (ref 0.0–0.1)
Basophils Relative: 0 %
Eosinophils Absolute: 0 10*3/uL (ref 0.0–0.5)
Eosinophils Relative: 0 %
HCT: 36.7 % (ref 36.0–46.0)
Hemoglobin: 12.6 g/dL (ref 12.0–15.0)
Immature Granulocytes: 0 %
Lymphocytes Relative: 25 %
Lymphs Abs: 2.2 10*3/uL (ref 0.7–4.0)
MCH: 31.3 pg (ref 26.0–34.0)
MCHC: 34.3 g/dL (ref 30.0–36.0)
MCV: 91.3 fL (ref 80.0–100.0)
Monocytes Absolute: 0.7 10*3/uL (ref 0.1–1.0)
Monocytes Relative: 7 %
Neutro Abs: 6 10*3/uL (ref 1.7–7.7)
Neutrophils Relative %: 68 %
Platelets: 332 10*3/uL (ref 150–400)
RBC: 4.02 MIL/uL (ref 3.87–5.11)
RDW: 14.9 % (ref 11.5–15.5)
WBC: 9 10*3/uL (ref 4.0–10.5)
nRBC: 0 % (ref 0.0–0.2)

## 2022-06-14 LAB — KAPPA/LAMBDA LIGHT CHAINS
Kappa free light chain: 15.9 mg/L (ref 3.3–19.4)
Kappa, lambda light chain ratio: 0.04 — ABNORMAL LOW (ref 0.26–1.65)
Lambda free light chains: 447.6 mg/L — ABNORMAL HIGH (ref 5.7–26.3)

## 2022-06-18 LAB — MULTIPLE MYELOMA PANEL, SERUM
Albumin SerPl Elph-Mcnc: 3.3 g/dL (ref 2.9–4.4)
Albumin/Glob SerPl: 0.7 (ref 0.7–1.7)
Alpha 1: 0.3 g/dL (ref 0.0–0.4)
Alpha2 Glob SerPl Elph-Mcnc: 0.7 g/dL (ref 0.4–1.0)
B-Globulin SerPl Elph-Mcnc: 3.6 g/dL — ABNORMAL HIGH (ref 0.7–1.3)
Gamma Glob SerPl Elph-Mcnc: 0.4 g/dL (ref 0.4–1.8)
Globulin, Total: 5.1 g/dL — ABNORMAL HIGH (ref 2.2–3.9)
IgA: 25 mg/dL — ABNORMAL LOW (ref 87–352)
IgG (Immunoglobin G), Serum: 3757 mg/dL — ABNORMAL HIGH (ref 586–1602)
IgM (Immunoglobulin M), Srm: 53 mg/dL (ref 26–217)
M Protein SerPl Elph-Mcnc: 2.8 g/dL — ABNORMAL HIGH
Total Protein ELP: 8.4 g/dL (ref 6.0–8.5)

## 2022-06-24 ENCOUNTER — Other Ambulatory Visit: Payer: Self-pay | Admitting: Family

## 2022-07-01 ENCOUNTER — Other Ambulatory Visit: Payer: Self-pay | Admitting: Nurse Practitioner

## 2022-09-10 ENCOUNTER — Inpatient Hospital Stay: Payer: Managed Care, Other (non HMO) | Attending: Oncology

## 2022-09-10 DIAGNOSIS — C9000 Multiple myeloma not having achieved remission: Secondary | ICD-10-CM | POA: Insufficient documentation

## 2022-09-10 DIAGNOSIS — D472 Monoclonal gammopathy: Secondary | ICD-10-CM

## 2022-09-10 LAB — CBC WITH DIFFERENTIAL/PLATELET
Abs Immature Granulocytes: 0.01 10*3/uL (ref 0.00–0.07)
Basophils Absolute: 0 10*3/uL (ref 0.0–0.1)
Basophils Relative: 1 %
Eosinophils Absolute: 0.1 10*3/uL (ref 0.0–0.5)
Eosinophils Relative: 1 %
HCT: 38 % (ref 36.0–46.0)
Hemoglobin: 12.8 g/dL (ref 12.0–15.0)
Immature Granulocytes: 0 %
Lymphocytes Relative: 38 %
Lymphs Abs: 2.9 10*3/uL (ref 0.7–4.0)
MCH: 31.5 pg (ref 26.0–34.0)
MCHC: 33.7 g/dL (ref 30.0–36.0)
MCV: 93.6 fL (ref 80.0–100.0)
Monocytes Absolute: 0.6 10*3/uL (ref 0.1–1.0)
Monocytes Relative: 8 %
Neutro Abs: 4.1 10*3/uL (ref 1.7–7.7)
Neutrophils Relative %: 52 %
Platelets: 281 10*3/uL (ref 150–400)
RBC: 4.06 MIL/uL (ref 3.87–5.11)
RDW: 13 % (ref 11.5–15.5)
WBC: 7.7 10*3/uL (ref 4.0–10.5)
nRBC: 0 % (ref 0.0–0.2)

## 2022-09-10 LAB — COMPREHENSIVE METABOLIC PANEL
ALT: 17 U/L (ref 0–44)
AST: 20 U/L (ref 15–41)
Albumin: 3 g/dL — ABNORMAL LOW (ref 3.5–5.0)
Alkaline Phosphatase: 48 U/L (ref 38–126)
Anion gap: 7 (ref 5–15)
BUN: 15 mg/dL (ref 6–20)
CO2: 26 mmol/L (ref 22–32)
Calcium: 10.1 mg/dL (ref 8.9–10.3)
Chloride: 101 mmol/L (ref 98–111)
Creatinine, Ser: 0.88 mg/dL (ref 0.44–1.00)
GFR, Estimated: >60 mL/min (ref >60)
Glucose, Bld: 89 mg/dL (ref 70–99)
Potassium: 4 mmol/L (ref 3.5–5.1)
Sodium: 134 mmol/L — ABNORMAL LOW (ref 135–145)
Total Bilirubin: 0.4 mg/dL (ref 0.3–1.2)
Total Protein: 8.6 g/dL — ABNORMAL HIGH (ref 6.5–8.1)

## 2022-09-11 LAB — KAPPA/LAMBDA LIGHT CHAINS
Kappa free light chain: 12.1 mg/L (ref 3.3–19.4)
Kappa, lambda light chain ratio: 0.04 — ABNORMAL LOW (ref 0.26–1.65)
Lambda free light chains: 308.9 mg/L — ABNORMAL HIGH (ref 5.7–26.3)

## 2022-09-17 ENCOUNTER — Inpatient Hospital Stay: Payer: Managed Care, Other (non HMO) | Admitting: Oncology

## 2022-09-17 LAB — MULTIPLE MYELOMA PANEL, SERUM
Albumin SerPl Elph-Mcnc: 3.6 g/dL (ref 2.9–4.4)
Albumin/Glob SerPl: 0.8 (ref 0.7–1.7)
Alpha 1: 0.2 g/dL (ref 0.0–0.4)
Alpha2 Glob SerPl Elph-Mcnc: 0.6 g/dL (ref 0.4–1.0)
B-Globulin SerPl Elph-Mcnc: 3.5 g/dL — ABNORMAL HIGH (ref 0.7–1.3)
Gamma Glob SerPl Elph-Mcnc: 0.2 g/dL — ABNORMAL LOW (ref 0.4–1.8)
Globulin, Total: 4.7 g/dL — ABNORMAL HIGH (ref 2.2–3.9)
IgA: 17 mg/dL — ABNORMAL LOW (ref 87–352)
IgG (Immunoglobin G), Serum: 3227 mg/dL — ABNORMAL HIGH (ref 586–1602)
IgM (Immunoglobulin M), Srm: 31 mg/dL (ref 26–217)
M Protein SerPl Elph-Mcnc: 3.5 g/dL — ABNORMAL HIGH
Total Protein ELP: 8.3 g/dL (ref 6.0–8.5)

## 2022-09-27 ENCOUNTER — Ambulatory Visit: Payer: Managed Care, Other (non HMO) | Admitting: Nurse Practitioner

## 2022-09-27 ENCOUNTER — Encounter: Payer: Self-pay | Admitting: Nurse Practitioner

## 2022-09-27 VITALS — BP 150/80 | HR 76 | Temp 98.0°F | Ht 63.0 in | Wt 164.2 lb

## 2022-09-27 DIAGNOSIS — R079 Chest pain, unspecified: Secondary | ICD-10-CM

## 2022-09-27 DIAGNOSIS — Z1231 Encounter for screening mammogram for malignant neoplasm of breast: Secondary | ICD-10-CM | POA: Diagnosis not present

## 2022-09-27 DIAGNOSIS — F419 Anxiety disorder, unspecified: Secondary | ICD-10-CM

## 2022-09-27 MED ORDER — CLONAZEPAM 1 MG PO TABS: 1.0000 mg | ORAL_TABLET | Freq: Two times a day (BID) | ORAL | 0 refills | Status: DC | PRN

## 2022-09-27 NOTE — Patient Instructions (Addendum)
Rx sent to pharmacy. Referral sent for psychiatry and mammogram. Check BP at home and bring the number to the nurse visit in 2 weeks.

## 2022-09-27 NOTE — Progress Notes (Signed)
Established Patient Office Visit  Subjective:  Patient ID: Paula Garrett, female    DOB: 11/05/1965  Age: 57 y.o. MRN: [id number]  CC:  Chief Complaint  Patient presents with   Acute Visit    Pain with breath/SOB/ anxiety    HPI  Paula Garrett presents for off and on chest pain and SOB since a month.  The has history of anxiety.    Her mother passed away recently and she is going through stress and anxiety.  She is seen by the therapist and psychiatrists online. She is taking Remeron, propanolol, hydroxyzine and Prozac.   She is requesting refill for Klonopin today and wants to see a psychiatrist in person.   HPI   Past Medical History:  Diagnosis Date   Anxiety    Arthritis    Asthma    Barrett's esophagus 2015   COVID-19    03/10/20   Depression    GERD (gastroesophageal reflux disease)    Hypertension    Hypothyroidism    Pneumonia    Thyroid disease     Past Surgical History:  Procedure Laterality Date   ABLATION  2006   BREAST CYST ASPIRATION Left 1998   BREAST SURGERY     cyst aspiration   BREAST SURGERY  1998   cyst aspiration   COLONOSCOPY  03/2014   COLONOSCOPY WITH PROPOFOL N/A 01/06/2020   Procedure: COLONOSCOPY WITH PROPOFOL;  Surgeon: Toledo, Boykin Nearing, MD;  Location: [facility] ENDOSCOPY;  Service: Gastroenterology;  Laterality: N/A;   ENDOMETRIAL ABLATION     ESOPHAGOGASTRODUODENOSCOPY (EGD) WITH PROPOFOL N/A 08/15/2015   Procedure: ESOPHAGOGASTRODUODENOSCOPY (EGD) WITH PROPOFOL;  Surgeon: Christena Deem, MD;  Location: [facility] ENDOSCOPY;  Service: Endoscopy;  Laterality: N/A;   ESOPHAGOGASTRODUODENOSCOPY (EGD) WITH PROPOFOL N/A 01/22/2016   Procedure: ESOPHAGOGASTRODUODENOSCOPY (EGD) WITH PROPOFOL;  Surgeon: Christena Deem, MD;  Location: [facility] ENDOSCOPY;  Service: Endoscopy;  Laterality: N/A;   ESOPHAGOGASTRODUODENOSCOPY (EGD) WITH PROPOFOL N/A 01/06/2020   Procedure: ESOPHAGOGASTRODUODENOSCOPY (EGD) WITH PROPOFOL;  Surgeon: Toledo, Boykin Nearing, MD;   Location: [facility] ENDOSCOPY;  Service: Gastroenterology;  Laterality: N/A;   GASTRIC BYPASS  2005   HERNIA REPAIR     NASAL SINUS SURGERY     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   TOTAL THYROIDECTOMY     TUBAL LIGATION      Family History  Problem Relation Age of Onset   Heart disease Mother    COPD Mother    Arthritis Mother    Cancer Mother        uterine   Lupus Mother    Anxiety disorder Mother    Depression Mother    Drug abuse Mother    COPD Father    Arthritis Father    Heart disease Father    Heart attack Father    Alcohol abuse Father    Diabetes Maternal Grandmother    Diabetes Maternal Grandfather    Diabetes Paternal Grandmother    Diabetes Paternal Grandfather    Heart disease Brother    Heart attack Brother    Drug abuse Brother    Anxiety disorder Brother    Depression Brother    Heart disease Brother    Cancer Brother        liver cancer age 56 y.o   Breast cancer Neg Hx     Social History   Socioeconomic History   Marital status: Divorced    Spouse name: Not on file   Number of  children: 2   Years of education: Not on file   Highest education level: High school graduate  Occupational History   Not on file  Tobacco Use   Smoking status: Every Day    Packs/day: 0.50    Years: 30.00    Additional pack years: 0.00    Total pack years: 15.00    Types: Cigarettes    Start date: 03/15/1980   Smokeless tobacco: Never  Vaping Use   Vaping Use: Never used  Substance and Sexual Activity   Alcohol use: Yes    Alcohol/week: 10.0 - 12.0 standard drinks of alcohol    Types: 8 - 10 Glasses of wine, 2 Standard drinks or equivalent per week   Drug use: No   Sexual activity: Yes    Partners: Male  Other Topics Concern   Not on file  Social History Narrative   Lives in [place] single, divorced       Work - [employer]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       Diet - healthy diet   Exercise - limited   Caffeine use: daily   Social Determinants of  Health   Financial Resource Strain: Low Risk  (05/19/2017)   Overall Financial Resource Strain (CARDIA)    Difficulty of Paying Living Expenses: Not hard at all  Food Insecurity: No Food Insecurity (05/19/2017)   Hunger Vital Sign    Worried About Running Out of Food in the Last Year: Never true    Ran Out of Food in the Last Year: Never true  Transportation Needs: No Transportation Needs (05/19/2017)   PRAPARE - Administrator, Civil Service (Medical): No    Lack of Transportation (Non-Medical): No  Physical Activity: Sufficiently Active (05/19/2017)   Exercise Vital Sign    Days of Exercise per Week: 4 days    Minutes of Exercise per Session: 40 min  Stress: Stress Concern Present (05/19/2017)   [employer] of Occupational Health - Occupational Stress Questionnaire    Feeling of Stress : Very much  Social Connections: Moderately Isolated (05/19/2017)   Social Connection and Isolation Panel [NHANES]    Frequency of Communication with Friends and Family: Three times a week    Frequency of Social Gatherings with Friends and Family: Once a week    Attends Religious Services: Never    Database administrator or Organizations: No    Attends Banker Meetings: Never    Marital Status: Divorced  Catering manager Violence: Not At Risk (05/19/2017)   Humiliation, Afraid, Rape, and Kick questionnaire    Fear of Current or Ex-Partner: No    Emotionally Abused: No    Physically Abused: No    Sexually Abused: No     Outpatient Medications Prior to Visit  Medication Sig Dispense Refill   albuterol (VENTOLIN HFA) 108 (90 Base) MCG/ACT inhaler Inhale 2 puffs into the lungs every 6 (six) hours as needed for wheezing or shortness of breath. 18 g 11   amLODipine (NORVASC) 5 MG tablet Take 1 tablet (5 mg total) by mouth daily. 90 tablet 3   budesonide-formoterol (SYMBICORT) 160-4.5 MCG/ACT inhaler Inhale 2 puffs into the lungs 2 (two) times daily. Rinse mouth 1 each 11    cetirizine (ZYRTEC) 10 MG tablet Take 1 tablet (10 mg total) by mouth daily. 90 tablet 3   cyanocobalamin (VITAMIN B12) 1000 MCG/ML injection INJECT 1 ML IN THE MUSCLE EVERY 30 DAYS 4 mL 12   cyclobenzaprine (FLEXERIL) 5 MG  tablet Take 1 tablet (5 mg total) by mouth at bedtime as needed for muscle spasms. 90 tablet 1   FLUoxetine (PROZAC) 40 MG capsule Take 40 mg by mouth daily. thriveworks     hydrOXYzine (ATARAX) 50 MG tablet Take 25 mg by mouth 2 (two) times daily as needed.     hydrOXYzine (VISTARIL) 50 MG capsule SMARTSIG:1-2 Capsule(s) By Mouth PRN     levothyroxine (SYNTHROID) 112 MCG tablet Take 224 mcg by mouth daily before breakfast. Pt states that she has to take 2 pills a day to get 224 mg     methylPREDNISolone (MEDROL DOSEPAK) 4 MG TBPK tablet Use as directed in am with food 21 tablet 0   mirtazapine (REMERON) 30 MG tablet Take 30 mg by mouth at bedtime.     montelukast (SINGULAIR) 10 MG tablet Take 1 tablet (10 mg total) by mouth at bedtime. 90 tablet 3   ondansetron (ZOFRAN) 8 MG tablet Take 1 tablet (8 mg total) by mouth every 8 (eight) hours as needed for nausea or vomiting. 60 tablet 0   pantoprazole (PROTONIX) 40 MG tablet 30 min before lunch or dinner 90 tablet 3   propranolol (INDERAL) 10 MG tablet Take 10 mg by mouth every 6 (six) hours as needed.     SYRINGE-NEEDLE, DISP, 3 ML 25G X 1-1/2" 3 ML MISC 1 Device by Does not apply route every 30 (thirty) days. With B12 shot 12 each 1   telmisartan (MICARDIS) 80 MG tablet 1 whole pill qd goal BP <130/<80. 90 tablet 3   clonazePAM (KLONOPIN) 1 MG tablet Take 1 tablet (1 mg total) by mouth 2 (two) times daily as needed for anxiety. 30 tablet 0   No facility-administered medications prior to visit.    Allergies  Allergen Reactions   Hctz [Hydrochlorothiazide]     Hyponatremia     ROS Review of Systems  Constitutional: Negative.   HENT: Negative.    Respiratory:  Positive for shortness of breath.   Cardiovascular:   Positive for chest pain.  Neurological: Negative.   Psychiatric/Behavioral:  Positive for sleep disturbance. The patient is nervous/anxious.       Objective:    Physical Exam Constitutional:      Appearance: Normal appearance.  Cardiovascular:     Rate and Rhythm: Normal rate and regular rhythm.     Pulses: Normal pulses.     Heart sounds: Normal heart sounds. No murmur heard. Pulmonary:     Effort: Pulmonary effort is normal.     Breath sounds: Normal breath sounds. No stridor. No wheezing.  Musculoskeletal:        General: No swelling or tenderness.     Right lower leg: No edema.     Left lower leg: No edema.  Skin:    Coloration: Skin is not jaundiced.     Findings: No erythema.  Neurological:     General: No focal deficit present.     Mental Status: She is alert and oriented to person, place, and time. Mental status is at baseline.  Psychiatric:        Mood and Affect: Mood normal.        Behavior: Behavior normal.        Thought Content: Thought content normal.        Judgment: Judgment normal.     BP (!) 150/80   Pulse 76   Temp 98 F (36.7 C) (Oral)   Ht  (1.6 m)   Wt 164 lb  3.2 oz (74.5 kg)   SpO2 98%   BMI 29.09 kg/m  Wt Readings from Last 3 Encounters:  10/01/22 164 lb (74.4 kg)  09/27/22 164 lb 3.2 oz (74.5 kg)  03/18/22 158 lb 12.8 oz (72 kg)     Health Maintenance  Topic Date Due   Zoster Vaccines- Shingrix (1 of 2) Never done   DTaP/Tdap/Td (2 - Td or Tdap) 04/10/2022   COVID-19 Vaccine (4 - 2023-24 season) 10/13/2022 (Originally 02/15/2022)   INFLUENZA VACCINE  01/16/2023   MAMMOGRAM  04/10/2023   PAP SMEAR-Modifier  11/22/2023   COLONOSCOPY (Pts 45-37yrs Insurance coverage will need to be confirmed)  01/05/2030   HPV VACCINES  Aged Out   Lung Cancer Screening  Discontinued    There are no preventive care reminders to display for this patient.  Lab Results  Component Value Date   TSH 7.83 (H) 10/25/2021   Lab Results  Component  Value Date   WBC 7.7 09/10/2022   HGB 12.8 09/10/2022   HCT 38.0 09/10/2022   MCV 93.6 09/10/2022   PLT 281 09/10/2022   Lab Results  Component Value Date   NA 134 (L) 09/10/2022   K 4.0 09/10/2022   CO2 26 09/10/2022   GLUCOSE 89 09/10/2022   BUN 15 09/10/2022   CREATININE 0.88 09/10/2022   BILITOT 0.4 09/10/2022   ALKPHOS 48 09/10/2022   AST 20 09/10/2022   ALT 17 09/10/2022   PROT 8.6 (H) 09/10/2022   ALBUMIN 3.0 (L) 09/10/2022   CALCIUM 10.1 09/10/2022   ANIONGAP 7 09/10/2022   GFR 95.86 10/25/2021   Lab Results  Component Value Date   CHOL 191 10/25/2021   Lab Results  Component Value Date   HDL 75.10 10/25/2021   Lab Results  Component Value Date   LDLCALC 104 (H) 10/25/2021   Lab Results  Component Value Date   TRIG 59.0 10/25/2021   Lab Results  Component Value Date   CHOLHDL 3 10/25/2021   Lab Results  Component Value Date   HGBA1C 6.2 10/25/2021      Assessment & Plan:  Chest pain, unspecified type Assessment & Plan: Patient BP  Vitals:   09/27/22 1327 09/27/22 1435  BP: (!) 150/80 (!) 150/80    in the office. Advised pt to follow a low sodium and heart healthy diet. Continue telmisartan, amlodipine and propranolol. EKG reassuring.      Orders: -     EKG 12-Lead  Anxiety -     Ambulatory referral to Psychiatry -     clonazePAM; Take 1 tablet (1 mg total) by mouth 2 (two) times daily as needed for anxiety.  Dispense: 60 tablet; Refill: 0  Screening mammogram, encounter for -     3D Screening Mammogram, Left and Right; Future    Follow-up: Return in about 2 weeks (around 10/11/2022) for nurse visit BP check.   Kara Dies, NP

## 2022-10-01 ENCOUNTER — Inpatient Hospital Stay: Payer: Managed Care, Other (non HMO) | Attending: Oncology | Admitting: Oncology

## 2022-10-01 ENCOUNTER — Encounter: Payer: Self-pay | Admitting: Oncology

## 2022-10-01 VITALS — BP 128/90 | HR 97 | Temp 97.9°F | Resp 18 | Ht 63.0 in | Wt 164.0 lb

## 2022-10-01 DIAGNOSIS — Z8719 Personal history of other diseases of the digestive system: Secondary | ICD-10-CM | POA: Insufficient documentation

## 2022-10-01 DIAGNOSIS — F17210 Nicotine dependence, cigarettes, uncomplicated: Secondary | ICD-10-CM | POA: Insufficient documentation

## 2022-10-01 DIAGNOSIS — C9000 Multiple myeloma not having achieved remission: Secondary | ICD-10-CM | POA: Insufficient documentation

## 2022-10-01 DIAGNOSIS — Z8049 Family history of malignant neoplasm of other genital organs: Secondary | ICD-10-CM | POA: Insufficient documentation

## 2022-10-01 DIAGNOSIS — D472 Monoclonal gammopathy: Secondary | ICD-10-CM | POA: Diagnosis not present

## 2022-10-01 NOTE — Progress Notes (Signed)
Patient is having pain in chest that is moving to her back for 2 months.

## 2022-10-04 ENCOUNTER — Telehealth: Payer: Self-pay | Admitting: *Deleted

## 2022-10-04 ENCOUNTER — Encounter: Payer: Self-pay | Admitting: Oncology

## 2022-10-04 NOTE — Telephone Encounter (Signed)
Called pt to let her about not to forget about getting the xray. [facility] and they do those xrays on Sundays from 8 to 4:30. The patient is agreeable to that.

## 2022-10-04 NOTE — Progress Notes (Signed)
Hematology/Oncology Consult note [facility]  [phone] [phone]  Patient Care Team: Kara Dies, NP as PCP - General (Nurse Practitioner) Earline Mayotte, MD (General Surgery) Shelia Media, MD (Internal Medicine) Creig Hines, MD as Consulting Physician (Oncology)   Name of the patient: Paula Garrett  [id number]  1965/09/18   Date of visit: 10/04/22  Diagnosis-smoldering multiple myeloma  Chief complaint/ Reason for visit-routine follow-up of smoldering multiple myeloma  Heme/Onc history: Patient is a 57 year old female with a past medical history significant for vitamin D deficiency B12 deficiency hypothyroidism and history of gastric bypass.  She was recently seen by Dr. Vincent Gros from endocrinology for her hypothyroidism.  She was found to have a mildly elevated calcium and therefore an SPEP was checked.  SPEP and urine showed IgG lambda light chains.  SPEP showed 2.2 g of M protein serum calcium was mildly elevated at 10.7.  Serum creatinine normal at 0.8.    Results of myeloma work-up from 06/20/2020 showed an elevated IgG of 3668.  M protein was elevated at 3.1 and IFE showed IgG monoclonal lambda protein.  Beta-2 microglobulin and LDH were normal.  Serum free light chain ratio was elevated at 25 with a free light chain lambda of 303.  Bone marrow biopsy showed increased plasma cells 22% by manual count and 20% by IHC staining for CD138.  Cytogenetics for myeloma showed gain of 1 q.  PET CT scan was not approved by insurance.  MRI cervical and lumbar thoracic spine did not show any evidence of lytic lesions.  Bone survey was negative for bone lesions as well.  MRI pelvis with and without contrast showed diffuse patchy heterogeneous marrow concerning for myeloma  Interval history-patient is doing well presently.  Appetite and weight have remained stable.  Denies any back pain.  She reports having left chest wall pain that  radiates to her back which has been present on and off for the last 2 months.  She was seen by PCP and had cardiac workup done including EKG which was unremarkable.  ECOG PS- 1 Pain scale- 3 Opioid associated constipation- no  Review of systems- Review of Systems  Constitutional:  Negative for chills, fever, malaise/fatigue and weight loss.  HENT:  Negative for congestion, ear discharge and nosebleeds.   Eyes:  Negative for blurred vision.  Respiratory:  Negative for cough, hemoptysis, sputum production, shortness of breath and wheezing.   Cardiovascular:  Negative for chest pain, palpitations, orthopnea and claudication.  Gastrointestinal:  Negative for abdominal pain, blood in stool, constipation, diarrhea, heartburn, melena, nausea and vomiting.  Genitourinary:  Negative for dysuria, flank pain, frequency, hematuria and urgency.  Musculoskeletal:  Negative for back pain, joint pain and myalgias.  Skin:  Negative for rash.  Neurological:  Negative for dizziness, tingling, focal weakness, seizures, weakness and headaches.  Endo/Heme/Allergies:  Does not bruise/bleed easily.  Psychiatric/Behavioral:  Negative for depression and suicidal ideas. The patient does not have insomnia.       Allergies  Allergen Reactions   Hctz [Hydrochlorothiazide]     Hyponatremia      Past Medical History:  Diagnosis Date   Anxiety    Arthritis    Asthma    Barrett's esophagus 2015   COVID-19    03/10/20   Depression    GERD (gastroesophageal reflux disease)    Hypertension    Hypothyroidism    Pneumonia    Thyroid disease      Past Surgical  History:  Procedure Laterality Date   ABLATION  2006   BREAST CYST ASPIRATION Left 1998   BREAST SURGERY     cyst aspiration   BREAST SURGERY  1998   cyst aspiration   COLONOSCOPY  03/2014   COLONOSCOPY WITH PROPOFOL N/A 01/06/2020   Procedure: COLONOSCOPY WITH PROPOFOL;  Surgeon: Toledo, Boykin Nearing, MD;  Location: [facility] ENDOSCOPY;  Service:  Gastroenterology;  Laterality: N/A;   ENDOMETRIAL ABLATION     ESOPHAGOGASTRODUODENOSCOPY (EGD) WITH PROPOFOL N/A 08/15/2015   Procedure: ESOPHAGOGASTRODUODENOSCOPY (EGD) WITH PROPOFOL;  Surgeon: Christena Deem, MD;  Location: [facility] ENDOSCOPY;  Service: Endoscopy;  Laterality: N/A;   ESOPHAGOGASTRODUODENOSCOPY (EGD) WITH PROPOFOL N/A 01/22/2016   Procedure: ESOPHAGOGASTRODUODENOSCOPY (EGD) WITH PROPOFOL;  Surgeon: Christena Deem, MD;  Location: [facility] ENDOSCOPY;  Service: Endoscopy;  Laterality: N/A;   ESOPHAGOGASTRODUODENOSCOPY (EGD) WITH PROPOFOL N/A 01/06/2020   Procedure: ESOPHAGOGASTRODUODENOSCOPY (EGD) WITH PROPOFOL;  Surgeon: Toledo, Boykin Nearing, MD;  Location: [facility] ENDOSCOPY;  Service: Gastroenterology;  Laterality: N/A;   GASTRIC BYPASS  2005   HERNIA REPAIR     NASAL SINUS SURGERY     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   TOTAL THYROIDECTOMY     TUBAL LIGATION      Social History   Socioeconomic History   Marital status: Divorced    Spouse name: Not on file   Number of children: 2   Years of education: Not on file   Highest education level: High school graduate  Occupational History   Not on file  Tobacco Use   Smoking status: Every Day    Packs/day: 0.50    Years: 30.00    Additional pack years: 0.00    Total pack years: 15.00    Types: Cigarettes    Start date: 03/15/1980   Smokeless tobacco: Never  Vaping Use   Vaping Use: Never used  Substance and Sexual Activity   Alcohol use: Yes    Alcohol/week: 10.0 - 12.0 standard drinks of alcohol    Types: 8 - 10 Glasses of wine, 2 Standard drinks or equivalent per week   Drug use: No   Sexual activity: Yes    Partners: Male  Other Topics Concern   Not on file  Social History Narrative   Lives in [place] single, divorced       Work - 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       Diet - healthy diet   Exercise - limited   Caffeine use: daily   Social Determinants of Health   Financial Resource Strain: Low  Risk  (05/19/2017)   Overall Financial Resource Strain (CARDIA)    Difficulty of Paying Living Expenses: Not hard at all  Food Insecurity: No Food Insecurity (05/19/2017)   Hunger Vital Sign    Worried About Running Out of Food in the Last Year: Never true    Ran Out of Food in the Last Year: Never true  Transportation Needs: No Transportation Needs (05/19/2017)   PRAPARE - Administrator, Civil Service (Medical): No    Lack of Transportation (Non-Medical): No  Physical Activity: Sufficiently Active (05/19/2017)   Exercise Vital Sign    Days of Exercise per Week: 4 days    Minutes of Exercise per Session: 40 min  Stress: Stress Concern Present (05/19/2017)   [employer] of Occupational Health - Occupational Stress Questionnaire    Feeling of Stress : Very much  Social Connections: Moderately Isolated (05/19/2017)   Social Connection  and Isolation Panel [NHANES]    Frequency of Communication with Friends and Family: Three times a week    Frequency of Social Gatherings with Friends and Family: Once a week    Attends Religious Services: Never    Database administrator or Organizations: No    Attends Banker Meetings: Never    Marital Status: Divorced  Catering manager Violence: Not At Risk (05/19/2017)   Humiliation, Afraid, Rape, and Kick questionnaire    Fear of Current or Ex-Partner: No    Emotionally Abused: No    Physically Abused: No    Sexually Abused: No    Family History  Problem Relation Age of Onset   Heart disease Mother    COPD Mother    Arthritis Mother    Cancer Mother        uterine   Lupus Mother    Anxiety disorder Mother    Depression Mother    Drug abuse Mother    COPD Father    Arthritis Father    Heart disease Father    Heart attack Father    Alcohol abuse Father    Diabetes Maternal Grandmother    Diabetes Maternal Grandfather    Diabetes Paternal Grandmother    Diabetes Paternal Grandfather    Heart disease Brother     Heart attack Brother    Drug abuse Brother    Anxiety disorder Brother    Depression Brother    Heart disease Brother    Cancer Brother        liver cancer age 59 y.o   Breast cancer Neg Hx      Current Outpatient Medications:    albuterol (VENTOLIN HFA) 108 (90 Base) MCG/ACT inhaler, Inhale 2 puffs into the lungs every 6 (six) hours as needed for wheezing or shortness of breath., Disp: 18 g, Rfl: 11   amLODipine (NORVASC) 5 MG tablet, Take 1 tablet (5 mg total) by mouth daily., Disp: 90 tablet, Rfl: 3   budesonide-formoterol (SYMBICORT) 160-4.5 MCG/ACT inhaler, Inhale 2 puffs into the lungs 2 (two) times daily. Rinse mouth, Disp: 1 each, Rfl: 11   cetirizine (ZYRTEC) 10 MG tablet, Take 1 tablet (10 mg total) by mouth daily., Disp: 90 tablet, Rfl: 3   clonazePAM (KLONOPIN) 1 MG tablet, Take 1 tablet (1 mg total) by mouth 2 (two) times daily as needed for anxiety., Disp: 60 tablet, Rfl: 0   cyanocobalamin (VITAMIN B12) 1000 MCG/ML injection, INJECT 1 ML IN THE MUSCLE EVERY 30 DAYS, Disp: 4 mL, Rfl: 12   cyclobenzaprine (FLEXERIL) 5 MG tablet, Take 1 tablet (5 mg total) by mouth at bedtime as needed for muscle spasms., Disp: 90 tablet, Rfl: 1   FLUoxetine (PROZAC) 40 MG capsule, Take 40 mg by mouth daily. thriveworks, Disp: , Rfl:    hydrOXYzine (ATARAX) 50 MG tablet, Take 25 mg by mouth 2 (two) times daily as needed., Disp: , Rfl:    hydrOXYzine (VISTARIL) 50 MG capsule, SMARTSIG:1-2 Capsule(s) By Mouth PRN, Disp: , Rfl:    levothyroxine (SYNTHROID) 112 MCG tablet, Take 224 mcg by mouth daily before breakfast. Pt states that she has to take 2 pills a day to get 224 mg, Disp: , Rfl:    methylPREDNISolone (MEDROL DOSEPAK) 4 MG TBPK tablet, Use as directed in am with food, Disp: 21 tablet, Rfl: 0   mirtazapine (REMERON) 30 MG tablet, Take 30 mg by mouth at bedtime., Disp: , Rfl:    montelukast (SINGULAIR) 10 MG tablet, Take  1 tablet (10 mg total) by mouth at bedtime., Disp: 90 tablet, Rfl: 3    ondansetron (ZOFRAN) 8 MG tablet, Take 1 tablet (8 mg total) by mouth every 8 (eight) hours as needed for nausea or vomiting., Disp: 60 tablet, Rfl: 0   pantoprazole (PROTONIX) 40 MG tablet, 30 min before lunch or dinner, Disp: 90 tablet, Rfl: 3   propranolol (INDERAL) 10 MG tablet, Take 10 mg by mouth every 6 (six) hours as needed., Disp: , Rfl:    SYRINGE-NEEDLE, DISP, 3 ML 25G X 1-1/2" 3 ML MISC, 1 Device by Does not apply route every 30 (thirty) days. With B12 shot, Disp: 12 each, Rfl: 1   telmisartan (MICARDIS) 80 MG tablet, 1 whole pill qd goal BP <130/<80., Disp: 90 tablet, Rfl: 3  Physical exam:  Vitals:   10/01/22 1529  BP: (!) 128/90  Pulse: 97  Resp: 18  Temp: 97.9 F (36.6 C)  TempSrc: Tympanic  SpO2: 98%  Weight: 164 lb (74.4 kg)  Height: 5\' 3"  (1.6 m)   Physical Exam Cardiovascular:     Rate and Rhythm: Normal rate and regular rhythm.     Heart sounds: Normal heart sounds.  Pulmonary:     Effort: Pulmonary effort is normal.     Breath sounds: Normal breath sounds.  Abdominal:     General: Bowel sounds are normal.     Palpations: Abdomen is soft.  Skin:    General: Skin is warm and dry.  Neurological:     Mental Status: She is alert and oriented to person, place, and time.         Latest Ref Rng & Units 09/10/2022    1:29 PM  CMP  Glucose 70 - 99 mg/dL 89   BUN 6 - 20 mg/dL 15   Creatinine 1.61 - 1.00 mg/dL 0.96   Sodium 045 - 409 mmol/L 134   Potassium 3.5 - 5.1 mmol/L 4.0   Chloride 98 - 111 mmol/L 101   CO2 22 - 32 mmol/L 26   Calcium 8.9 - 10.3 mg/dL 81.1   Total Protein 6.5 - 8.1 g/dL 8.6   Total Bilirubin 0.3 - 1.2 mg/dL 0.4   Alkaline Phos 38 - 126 U/L 48   AST 15 - 41 U/L 20   ALT 0 - 44 U/L 17       Latest Ref Rng & Units 09/10/2022    1:29 PM  CBC  WBC 4.0 - 10.5 K/uL 7.7   Hemoglobin 12.0 - 15.0 g/dL 91.4   Hematocrit 78.2 - 46.0 % 38.0   Platelets 150 - 400 K/uL 281      Assessment and plan- Patient is a 57 y.o. female with  history of IgG lambda smoldering multiple myeloma here for routine follow-up   Patient is not presently anemic and does not have any evidence of hypercalcemia or renal injury.  Patient was noted to have an M protein of 3.2 g even back in December 2022 and is presently at 3.5.  Serum free lambda light chain had gone up to 496 months ago and is again down to 308.  Overall her myeloma numbers have fluctuated with no clear rising trend to suggest any overt progression to multiple myeloma.  She has not obtained a bone survey in the last 2 years and I have encouraged her to go for bone survey again today.  She had her last MRI spine in February 2022 which did not show any evidence of lytic lesions.  I plan is to continue to monitor her smoldering multiple myeloma with CBC with differential CMP myeloma panel and serum free light chains every 3 months and I will see her back in 6 months    Visit Diagnosis 1. Smoldering multiple myeloma      Dr. Owens Shark, MD, MPH [facility] [phone] 10/04/2022 1:09 PM

## 2022-10-07 DIAGNOSIS — F419 Anxiety disorder, unspecified: Secondary | ICD-10-CM | POA: Insufficient documentation

## 2022-10-07 DIAGNOSIS — R079 Chest pain, unspecified: Secondary | ICD-10-CM | POA: Insufficient documentation

## 2022-10-07 DIAGNOSIS — F411 Generalized anxiety disorder: Secondary | ICD-10-CM | POA: Insufficient documentation

## 2022-10-07 NOTE — Assessment & Plan Note (Signed)
Refilled Klonopin. Referral sent to psych for further evaluation

## 2022-10-07 NOTE — Assessment & Plan Note (Signed)
Patient BP  Vitals:   09/27/22 1327 09/27/22 1435  BP: (!) 150/80 (!) 150/80    in the office. Advised pt to follow a low sodium and heart healthy diet. Continue telmisartan, amlodipine and propranolol. EKG reassuring.

## 2022-10-09 ENCOUNTER — Ambulatory Visit
Admission: RE | Admit: 2022-10-09 | Discharge: 2022-10-09 | Disposition: A | Discharge status: Home / Self Care | Payer: Managed Care, Other (non HMO) | Source: Ambulatory Visit | Attending: Oncology | Admitting: Oncology

## 2022-10-09 ENCOUNTER — Ambulatory Visit
Admission: RE | Admit: 2022-10-09 | Discharge: 2022-10-09 | Discharge status: Home / Self Care | Payer: Managed Care, Other (non HMO) | Source: Ambulatory Visit | Attending: Nurse Practitioner

## 2022-10-09 DIAGNOSIS — D472 Monoclonal gammopathy: Secondary | ICD-10-CM | POA: Insufficient documentation

## 2022-10-09 DIAGNOSIS — Z1231 Encounter for screening mammogram for malignant neoplasm of breast: Secondary | ICD-10-CM

## 2022-10-17 ENCOUNTER — Ambulatory Visit: Payer: Managed Care, Other (non HMO)

## 2022-10-21 ENCOUNTER — Ambulatory Visit: Payer: Managed Care, Other (non HMO)

## 2022-10-29 ENCOUNTER — Telehealth: Payer: Self-pay

## 2022-10-29 ENCOUNTER — Ambulatory Visit: Payer: Managed Care, Other (non HMO)

## 2022-10-29 ENCOUNTER — Other Ambulatory Visit: Payer: Self-pay | Admitting: Nurse Practitioner

## 2022-10-29 DIAGNOSIS — F419 Anxiety disorder, unspecified: Secondary | ICD-10-CM

## 2022-10-29 DIAGNOSIS — I10 Essential (primary) hypertension: Secondary | ICD-10-CM

## 2022-10-29 NOTE — Telephone Encounter (Signed)
A referral to psychiatry was submitted on 09/27/2022. Pt stated that she has not heard anything from them to schedule an appointment.

## 2022-10-29 NOTE — Progress Notes (Unsigned)
Pt presented for bp check and I  Checked it in her left . I checked it I her left arm and Bp was 156/86 O2 was 94 hr was 73. Had pt to relax for about 10 mins and went back to check it and I checked it in her left arm again an her Bp was 162/88 and  O2 was 94 and hr was 78. Pt experiences High anxiety . Pt had no sob cp or ha.

## 2022-10-29 NOTE — Telephone Encounter (Signed)
Refilled: 09/27/2022 Last OV: 09/27/2022 Next OV: not scheduled

## 2022-10-31 ENCOUNTER — Encounter: Payer: Self-pay | Admitting: Nurse Practitioner

## 2022-10-31 NOTE — Telephone Encounter (Signed)
Spoke to pt and scheduled her an appt to come in tomorrow 12/02/22 to check BP and possibly begin mediaion

## 2022-10-31 NOTE — Telephone Encounter (Signed)
Please call the patient and get the BP readings she have and medication she is taking for BP control.

## 2022-11-01 ENCOUNTER — Ambulatory Visit (INDEPENDENT_AMBULATORY_CARE_PROVIDER_SITE_OTHER): Payer: Managed Care, Other (non HMO)

## 2022-11-01 ENCOUNTER — Ambulatory Visit: Payer: Managed Care, Other (non HMO) | Admitting: Nurse Practitioner

## 2022-11-01 ENCOUNTER — Telehealth: Payer: Self-pay

## 2022-11-01 VITALS — BP 138/70 | HR 71 | Temp 98.1°F | Ht 63.0 in | Wt 161.8 lb

## 2022-11-01 DIAGNOSIS — I10 Essential (primary) hypertension: Secondary | ICD-10-CM

## 2022-11-01 DIAGNOSIS — J40 Bronchitis, not specified as acute or chronic: Secondary | ICD-10-CM | POA: Diagnosis not present

## 2022-11-01 DIAGNOSIS — M5136 Other intervertebral disc degeneration, lumbar region: Secondary | ICD-10-CM | POA: Diagnosis not present

## 2022-11-01 DIAGNOSIS — R0602 Shortness of breath: Secondary | ICD-10-CM

## 2022-11-01 MED ORDER — AMOXICILLIN-POT CLAVULANATE 875-125 MG PO TABS: 1.0000 | ORAL_TABLET | Freq: Two times a day (BID) | ORAL | 0 refills | Status: AC

## 2022-11-01 MED ORDER — BUDESONIDE-FORMOTEROL FUMARATE 160-4.5 MCG/ACT IN AERO: 2.0000 | INHALATION_SPRAY | Freq: Two times a day (BID) | RESPIRATORY_TRACT | 11 refills | Status: DC

## 2022-11-01 MED ORDER — AMLODIPINE BESYLATE 10 MG PO TABS: 10.0000 mg | ORAL_TABLET | Freq: Every day | ORAL | 3 refills | Status: DC

## 2022-11-01 MED ORDER — CYCLOBENZAPRINE HCL 5 MG PO TABS
5.0000 mg | ORAL_TABLET | Freq: Every evening | ORAL | 1 refills | Status: DC | PRN
Start: 2022-11-01 — End: 2023-01-09

## 2022-11-01 MED ORDER — FLUOXETINE HCL 40 MG PO CAPS: 40.0000 mg | ORAL_CAPSULE | Freq: Every day | ORAL | 1 refills | Status: DC

## 2022-11-01 NOTE — Progress Notes (Signed)
Established Patient Office Visit  Subjective:  Patient ID: Paula Garrett, female    DOB: [birth year]  Age: 57 y.o. MRN: [id number]  CC:  Chief Complaint  Patient presents with   Hypertension    HPI  Paula Garrett presents for hypertension. She state that her BP was high 180/110 yesterday and 170/90 this morning. She has some sob and congestion since months its off and on.  She denies any headache, chest pain and dizziness.   Past Medical History:  Diagnosis Date   Anxiety    Arthritis    Asthma    Barrett's esophagus 2015   COVID-19    03/10/20   Depression    GERD (gastroesophageal reflux disease)    Hypertension    Hypothyroidism    Pneumonia    Thyroid disease     Past Surgical History:  Procedure Laterality Date   ABLATION  2006   BREAST CYST ASPIRATION Left 1998   BREAST SURGERY     cyst aspiration   BREAST SURGERY  1998   cyst aspiration   COLONOSCOPY  03/2014   COLONOSCOPY WITH PROPOFOL N/A 01/06/2020   Procedure: COLONOSCOPY WITH PROPOFOL;  Surgeon: Toledo, Boykin Nearing, MD;  Location: [facility] ENDOSCOPY;  Service: Gastroenterology;  Laterality: N/A;   ENDOMETRIAL ABLATION     ESOPHAGOGASTRODUODENOSCOPY (EGD) WITH PROPOFOL N/A 08/15/2015   Procedure: ESOPHAGOGASTRODUODENOSCOPY (EGD) WITH PROPOFOL;  Surgeon: Christena Deem, MD;  Location: [facility] ENDOSCOPY;  Service: Endoscopy;  Laterality: N/A;   ESOPHAGOGASTRODUODENOSCOPY (EGD) WITH PROPOFOL N/A 01/22/2016   Procedure: ESOPHAGOGASTRODUODENOSCOPY (EGD) WITH PROPOFOL;  Surgeon: Christena Deem, MD;  Location: [facility] ENDOSCOPY;  Service: Endoscopy;  Laterality: N/A;   ESOPHAGOGASTRODUODENOSCOPY (EGD) WITH PROPOFOL N/A 01/06/2020   Procedure: ESOPHAGOGASTRODUODENOSCOPY (EGD) WITH PROPOFOL;  Surgeon: Toledo, Boykin Nearing, MD;  Location: [facility] ENDOSCOPY;  Service: Gastroenterology;  Laterality: N/A;   GASTRIC BYPASS  2005   HERNIA REPAIR     NASAL SINUS SURGERY     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   TOTAL  THYROIDECTOMY     TUBAL LIGATION      Family History  Problem Relation Age of Onset   Heart disease Mother    COPD Mother    Arthritis Mother    Cancer Mother        uterine   Lupus Mother    Anxiety disorder Mother    Depression Mother    Drug abuse Mother    COPD Father    Arthritis Father    Heart disease Father    Heart attack Father    Alcohol abuse Father    Diabetes Maternal Grandmother    Diabetes Maternal Grandfather    Diabetes Paternal Grandmother    Diabetes Paternal Grandfather    Heart disease Brother    Heart attack Brother    Drug abuse Brother    Anxiety disorder Brother    Depression Brother    Heart disease Brother    Cancer Brother        liver cancer age 85 y.o   Breast cancer Neg Hx     Social History   Socioeconomic History   Marital status: Divorced    Spouse name: Not on file   Number of children: 2   Years of education: Not on file   Highest education level: GED or equivalent  Occupational History   Not on file  Tobacco Use   Smoking status: Every Day    Packs/day: 0.50  Years: 30.00    Additional pack years: 0.00    Total pack years: 15.00    Types: Cigarettes    Start date: 03/15/1980   Smokeless tobacco: Never  Vaping Use   Vaping Use: Never used  Substance and Sexual Activity   Alcohol use: Yes    Alcohol/week: 10.0 - 12.0 standard drinks of alcohol    Types: 8 - 10 Glasses of wine, 2 Standard drinks or equivalent per week   Drug use: No   Sexual activity: Yes    Partners: Male  Other Topics Concern   Not on file  Social History Narrative   Lives in [place] single, divorced       Work - 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       Diet - healthy diet   Exercise - limited   Caffeine use: daily   Social Determinants of Health   Financial Resource Strain: Low Risk  (11/01/2022)   Overall Financial Resource Strain (CARDIA)    Difficulty of Paying Living Expenses: Not very hard  Food Insecurity: No Food Insecurity  (11/01/2022)   Hunger Vital Sign    Worried About Running Out of Food in the Last Year: Never true    Ran Out of Food in the Last Year: Never true  Transportation Needs: No Transportation Needs (11/01/2022)   PRAPARE - Administrator, Civil Service (Medical): No    Lack of Transportation (Non-Medical): No  Physical Activity: Insufficiently Active (11/01/2022)   Exercise Vital Sign    Days of Exercise per Week: 3 days    Minutes of Exercise per Session: 20 min  Stress: Stress Concern Present (11/01/2022)   [employer] of Occupational Health - Occupational Stress Questionnaire    Feeling of Stress : Very much  Social Connections: Socially Isolated (11/01/2022)   Social Connection and Isolation Panel [NHANES]    Frequency of Communication with Friends and Family: More than three times a week    Frequency of Social Gatherings with Friends and Family: Once a week    Attends Religious Services: Never    Database administrator or Organizations: No    Attends Engineer, structural: Not on file    Marital Status: Divorced  Intimate Partner Violence: Not At Risk (05/19/2017)   Humiliation, Afraid, Rape, and Kick questionnaire    Fear of Current or Ex-Partner: No    Emotionally Abused: No    Physically Abused: No    Sexually Abused: No     Outpatient Medications Prior to Visit  Medication Sig Dispense Refill   albuterol (VENTOLIN HFA) 108 (90 Base) MCG/ACT inhaler Inhale 2 puffs into the lungs every 6 (six) hours as needed for wheezing or shortness of breath. 18 g 11   cetirizine (ZYRTEC) 10 MG tablet Take 1 tablet (10 mg total) by mouth daily. 90 tablet 3   clonazePAM (KLONOPIN) 1 MG tablet TAKE 1 TABLET(1 MG) BY MOUTH TWICE DAILY AS NEEDED FOR ANXIETY 60 tablet 0   cyanocobalamin (VITAMIN B12) 1000 MCG/ML injection INJECT 1 ML IN THE MUSCLE EVERY 30 DAYS 4 mL 12   levothyroxine (SYNTHROID) 112 MCG tablet Take 224 mcg by mouth daily before breakfast. Pt states that she  has to take 2 pills a day to get 224 mg     montelukast (SINGULAIR) 10 MG tablet Take 1 tablet (10 mg total) by mouth at bedtime. 90 tablet 3   ondansetron (ZOFRAN) 8 MG tablet Take 1 tablet (8 mg total)  by mouth every 8 (eight) hours as needed for nausea or vomiting. 60 tablet 0   pantoprazole (PROTONIX) 40 MG tablet 30 min before lunch or dinner 90 tablet 3   SYRINGE-NEEDLE, DISP, 3 ML 25G X 1-1/2" 3 ML MISC 1 Device by Does not apply route every 30 (thirty) days. With B12 shot 12 each 1   telmisartan (MICARDIS) 80 MG tablet 1 whole pill qd goal BP <130/<80. 90 tablet 3   amLODipine (NORVASC) 5 MG tablet Take 1 tablet (5 mg total) by mouth daily. 90 tablet 3   budesonide-formoterol (SYMBICORT) 160-4.5 MCG/ACT inhaler Inhale 2 puffs into the lungs 2 (two) times daily. Rinse mouth 1 each 11   cyclobenzaprine (FLEXERIL) 5 MG tablet Take 1 tablet (5 mg total) by mouth at bedtime as needed for muscle spasms. 90 tablet 1   FLUoxetine (PROZAC) 40 MG capsule Take 40 mg by mouth daily. thriveworks     hydrOXYzine (ATARAX) 50 MG tablet Take 25 mg by mouth 2 (two) times daily as needed.     hydrOXYzine (VISTARIL) 50 MG capsule SMARTSIG:1-2 Capsule(s) By Mouth PRN     methylPREDNISolone (MEDROL DOSEPAK) 4 MG TBPK tablet Use as directed in am with food 21 tablet 0   mirtazapine (REMERON) 30 MG tablet Take 30 mg by mouth at bedtime.     propranolol (INDERAL) 10 MG tablet Take 10 mg by mouth every 6 (six) hours as needed.     No facility-administered medications prior to visit.    Allergies  Allergen Reactions   Hctz [Hydrochlorothiazide]     Hyponatremia     ROS Review of Systems  Constitutional: Negative.   HENT:  Positive for congestion.   Respiratory:  Positive for shortness of breath.   Cardiovascular: Negative.   Gastrointestinal: Negative.   Neurological: Negative.   Psychiatric/Behavioral:  The patient is nervous/anxious.   Amlodipine,    Objective:    Physical Exam Constitutional:       Appearance: Normal appearance. She is obese.  HENT:     Head: Normocephalic.     Right Ear: Tympanic membrane normal.     Left Ear: Tympanic membrane normal.     Nose: Nose normal.     Mouth/Throat:     Mouth: Mucous membranes are moist.     Pharynx: Oropharynx is clear.  Eyes:     Extraocular Movements: Extraocular movements intact.     Conjunctiva/sclera: Conjunctivae normal.     Pupils: Pupils are equal, round, and reactive to light.  Cardiovascular:     Rate and Rhythm: Normal rate and regular rhythm.     Pulses: Normal pulses.     Heart sounds: Normal heart sounds.  Pulmonary:     Effort: Pulmonary effort is normal. No respiratory distress.     Breath sounds: Normal breath sounds. No rhonchi.  Abdominal:     General: Bowel sounds are normal.     Palpations: Abdomen is soft. There is no mass.     Tenderness: There is no abdominal tenderness.     Hernia: No hernia is present.  Musculoskeletal:        General: Normal range of motion.     Cervical back: Neck supple. No tenderness.  Skin:    General: Skin is warm.     Capillary Refill: Capillary refill takes less than 2 seconds.  Neurological:     General: No focal deficit present.     Mental Status: She is alert and oriented to person, place, and time.  Mental status is at baseline.  Psychiatric:        Mood and Affect: Mood normal.        Behavior: Behavior normal.        Thought Content: Thought content normal.        Judgment: Judgment normal.     BP 138/70   Pulse 71   Temp 98.1 F (36.7 C)   Ht 5\' 3"  (1.6 m)   Wt 161 lb 12.8 oz (73.4 kg)   SpO2 96%   BMI 28.66 kg/m  Wt Readings from Last 3 Encounters:  11/01/22 161 lb 12.8 oz (73.4 kg)  10/01/22 164 lb (74.4 kg)  09/27/22 164 lb 3.2 oz (74.5 kg)     Health Maintenance  Topic Date Due   Zoster Vaccines- Shingrix (1 of 2) Never done   COVID-19 Vaccine (4 - 2023-24 season) 02/15/2022   DTaP/Tdap/Td (2 - Td or Tdap) 04/10/2022   INFLUENZA VACCINE   01/16/2023   PAP SMEAR-Modifier  11/22/2023   MAMMOGRAM  10/08/2024   Colonoscopy  01/05/2030   HPV VACCINES  Aged Out   Lung Cancer Screening  Discontinued    There are no preventive care reminders to display for this patient.  Lab Results  Component Value Date   TSH 7.83 (H) 10/25/2021   Lab Results  Component Value Date   WBC 7.7 09/10/2022   HGB 12.8 09/10/2022   HCT 38.0 09/10/2022   MCV 93.6 09/10/2022   PLT 281 09/10/2022   Lab Results  Component Value Date   NA 134 (L) 09/10/2022   K 4.0 09/10/2022   CO2 26 09/10/2022   GLUCOSE 89 09/10/2022   BUN 15 09/10/2022   CREATININE 0.88 09/10/2022   BILITOT 0.4 09/10/2022   ALKPHOS 48 09/10/2022   AST 20 09/10/2022   ALT 17 09/10/2022   PROT 8.6 (H) 09/10/2022   ALBUMIN 3.0 (L) 09/10/2022   CALCIUM 10.1 09/10/2022   ANIONGAP 7 09/10/2022   GFR 95.86 10/25/2021   Lab Results  Component Value Date   CHOL 191 10/25/2021   Lab Results  Component Value Date   HDL 75.10 10/25/2021   Lab Results  Component Value Date   LDLCALC 104 (H) 10/25/2021   Lab Results  Component Value Date   TRIG 59.0 10/25/2021   Lab Results  Component Value Date   CHOLHDL 3 10/25/2021   Lab Results  Component Value Date   HGBA1C 6.2 10/25/2021      Assessment & Plan:  SOB (shortness of breath) Assessment & Plan: Chest x-ray ordered.  Orders: -     DG Chest 2 View; Future  Bronchitis Assessment & Plan: Will treat with Augmentin. Advised patient to take Symbicort inhaler twice a day and albuterol as needed. Chest x-ray pending.  Orders: -     Budesonide-Formoterol Fumarate; Inhale 2 puffs into the lungs 2 (two) times daily. Rinse mouth  Dispense: 1 each; Refill: 11  DDD (degenerative disc disease), lumbar -     Cyclobenzaprine HCl; Take 1 tablet (5 mg total) by mouth at bedtime as needed for muscle spasms.  Dispense: 90 tablet; Refill: 1  Benign essential hypertension Assessment & Plan: Patient BP  Vitals:    11/01/22 1309 11/01/22 1358  BP: (!) 140/78 138/70    in the office. Advised pt to follow a low sodium and heart healthy diet. Amlodipine increased from 5 mg to 10 mg Continue telmisartan and amlodipine.  Will continue to monitor    Other  orders -     amLODIPine Besylate; Take 1 tablet (10 mg total) by mouth daily.  Dispense: 90 tablet; Refill: 3 -     FLUoxetine HCl; Take 1 capsule (40 mg total) by mouth daily. thriveworks  Dispense: 90 capsule; Refill: 1 -     Amoxicillin-Pot Clavulanate; Take 1 tablet by mouth 2 (two) times daily for 7 days.  Dispense: 14 tablet; Refill: 0    Follow-up: Return in about 1 month (around 12/02/2022) for hypertension.   Kara Dies, NP

## 2022-11-01 NOTE — Telephone Encounter (Signed)
Patient states at check-out that she will call back to schedule her blood pressure machine calibration.

## 2022-11-01 NOTE — Patient Instructions (Signed)
X rays ordered. Amlodipine increased from 5 to 10 mg. Augmentin sent to the pharmacy.

## 2022-11-01 NOTE — Telephone Encounter (Signed)
Pt is being seen in the office today.

## 2022-11-04 ENCOUNTER — Encounter: Payer: Self-pay | Admitting: Nurse Practitioner

## 2022-11-04 ENCOUNTER — Telehealth: Payer: Self-pay | Admitting: Nurse Practitioner

## 2022-11-04 NOTE — Telephone Encounter (Signed)
Ok to write her a work note 

## 2022-11-04 NOTE — Telephone Encounter (Signed)
Patient was in office last week 2x, because of high BP. Patient was scheduled to work on Thursday, but did not because of her high BP. Patient is requesting a work excuse for 10/31/2022.

## 2022-11-05 NOTE — Telephone Encounter (Signed)
Printed and placed in folder for signature.

## 2022-11-05 NOTE — Telephone Encounter (Signed)
Note already done

## 2022-11-05 NOTE — Telephone Encounter (Signed)
Yes please

## 2022-11-05 NOTE — Telephone Encounter (Signed)
Is this okay to do?

## 2022-11-16 ENCOUNTER — Encounter: Payer: Self-pay | Admitting: Nurse Practitioner

## 2022-11-16 NOTE — Assessment & Plan Note (Addendum)
Patient BP  Vitals:   11/01/22 1309 11/01/22 1358  BP: (!) 140/78 138/70    in the office. Advised pt to follow a low sodium and heart healthy diet. Amlodipine increased from 5 mg to 10 mg Continue telmisartan and amlodipine.  Will continue to monitor

## 2022-11-16 NOTE — Assessment & Plan Note (Addendum)
Will treat with Augmentin. Advised patient to take Symbicort inhaler twice a day and albuterol as needed. Chest x-ray pending.

## 2022-11-16 NOTE — Assessment & Plan Note (Signed)
Chest xray ordered

## 2022-11-19 ENCOUNTER — Ambulatory Visit: Payer: Managed Care, Other (non HMO)

## 2022-11-20 ENCOUNTER — Encounter: Payer: Self-pay | Admitting: Oncology

## 2022-11-20 ENCOUNTER — Encounter: Payer: Self-pay | Admitting: Family Medicine

## 2022-11-20 ENCOUNTER — Ambulatory Visit: Payer: Managed Care, Other (non HMO) | Admitting: Family Medicine

## 2022-11-20 VITALS — BP 132/78 | HR 77 | Temp 97.8°F | Resp 20 | Ht 61.5 in | Wt 168.4 lb

## 2022-11-20 DIAGNOSIS — R7303 Prediabetes: Secondary | ICD-10-CM

## 2022-11-20 DIAGNOSIS — D472 Monoclonal gammopathy: Secondary | ICD-10-CM

## 2022-11-20 DIAGNOSIS — F332 Major depressive disorder, recurrent severe without psychotic features: Secondary | ICD-10-CM | POA: Insufficient documentation

## 2022-11-20 DIAGNOSIS — Z7689 Persons encountering health services in other specified circumstances: Secondary | ICD-10-CM

## 2022-11-20 DIAGNOSIS — F5104 Psychophysiologic insomnia: Secondary | ICD-10-CM

## 2022-11-20 DIAGNOSIS — F331 Major depressive disorder, recurrent, moderate: Secondary | ICD-10-CM

## 2022-11-20 DIAGNOSIS — J4489 Other specified chronic obstructive pulmonary disease: Secondary | ICD-10-CM

## 2022-11-20 DIAGNOSIS — F419 Anxiety disorder, unspecified: Secondary | ICD-10-CM

## 2022-11-20 DIAGNOSIS — I10 Essential (primary) hypertension: Secondary | ICD-10-CM

## 2022-11-20 DIAGNOSIS — E890 Postprocedural hypothyroidism: Secondary | ICD-10-CM | POA: Diagnosis not present

## 2022-11-20 DIAGNOSIS — E785 Hyperlipidemia, unspecified: Secondary | ICD-10-CM | POA: Diagnosis not present

## 2022-11-20 DIAGNOSIS — E538 Deficiency of other specified B group vitamins: Secondary | ICD-10-CM | POA: Diagnosis not present

## 2022-11-20 DIAGNOSIS — R0609 Other forms of dyspnea: Secondary | ICD-10-CM

## 2022-11-20 DIAGNOSIS — F17200 Nicotine dependence, unspecified, uncomplicated: Secondary | ICD-10-CM

## 2022-11-20 DIAGNOSIS — E039 Hypothyroidism, unspecified: Secondary | ICD-10-CM

## 2022-11-20 MED ORDER — ESCITALOPRAM OXALATE 10 MG PO TABS
10.0000 mg | ORAL_TABLET | Freq: Every day | ORAL | 2 refills | Status: AC
Start: 2022-11-20 — End: ?

## 2022-11-20 MED ORDER — CLONAZEPAM 1 MG PO TABS: 1.0000 mg | ORAL_TABLET | Freq: Two times a day (BID) | ORAL | 2 refills | Status: DC

## 2022-11-20 MED ORDER — ZOLPIDEM TARTRATE 10 MG PO TABS
5.0000 mg | ORAL_TABLET | Freq: Every evening | ORAL | 2 refills | Status: DC | PRN
Start: 2022-11-20 — End: 2023-03-04

## 2022-11-20 NOTE — Progress Notes (Signed)
Subjective:    Patient ID: Paula Garrett, female    DOB: 16-May-1966, 57 y.o.   MRN: [id number]  Paula Garrett is a 57 y.o. female presenting on 11/20/2022 for Establish Care (The pt is tearful because her mother just recently passed away. 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 Current Meds - Amlodipine 10mg  daily, Telmisartan 80mg  daily Reports good compliance, took meds today. Tolerating well, w/o complaints. Denies CP, dyspnea, HA, edema, dizziness / lightheadedness  Generalized Anxiety Disorder, Panic Attacks  Insomnia Major Depression recurrent moderate  She was followed by prior [facility]. She was on Klonopin previously. Previous PCP left and she saw PA since, and they would not re order the Klonopin.  Upcoming [facility] Lost mother recently, significant stressors with anxiety. She passed recently and her brother moved to [facility].  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  Smoldering Multiple Myeloma S/p Gastric Bypass  Abnormal lab results and Vitamin Deficiency  Hypothyroidism Followed by [facility] Endocrinology Dr Gershon Crane She has high dose since difficult due to absorption On Levothyroxine 125 mcg x 2 = 250 mcg      11/20/2022   10:27 AM 11/01/2022    1:10 PM 09/27/2022    1:37 PM  Depression screen PHQ 2/9  Decreased Interest 2 3 2   Down, Depressed, Hopeless 2 3 3   PHQ - 2 Score 4 6 5   Altered sleeping 2 3 3   Tired, decreased energy 2 2 3   Change in appetite 2 1 3   Feeling bad or failure about  yourself  2 2 3   Trouble concentrating 3 3 3   Moving slowly or fidgety/restless 3 1 3   Suicidal thoughts 0 0 0  PHQ-9 Score 18 18 23   Difficult doing work/chores Very difficult Somewhat difficult Extremely dIfficult      11/20/2022   11:23 AM 11/01/2022    1:10 PM 09/27/2022    1:37 PM 10/18/2020    3:36 PM  GAD 7 : Generalized Anxiety Score  Nervous, Anxious, on Edge 3 3 3 2   Control/stop worrying 3 2 3  0  Worry too much - different things 3 2 3  0  Trouble relaxing 3 2 3 2   Restless 1 2 3 2   Easily annoyed or irritable 1 2 3 1   Afraid - awful might happen 1 2 3  0  Total GAD 7 Score 15 15 21 7   Anxiety Difficulty Somewhat difficult Somewhat difficult Extremely difficult Very difficult     Past Medical History:  Diagnosis Date   Anxiety    Arthritis    Asthma    Barrett's esophagus 2015   COVID-19    03/10/20   Depression    GERD (gastroesophageal reflux disease)    Hypertension    Hypothyroidism    Pneumonia    Smoldering myeloma    Thyroid disease    Past Surgical History:  Procedure Laterality Date   ABLATION  2006   BREAST CYST ASPIRATION Left 1998   BREAST SURGERY     cyst aspiration   BREAST SURGERY  1998   cyst aspiration  COLONOSCOPY  03/2014   COLONOSCOPY WITH PROPOFOL N/A 01/06/2020   Procedure: COLONOSCOPY WITH PROPOFOL;  Surgeon: Toledo, Boykin Nearing, MD;  Location: [facility] ENDOSCOPY;  Service: Gastroenterology;  Laterality: N/A;   ENDOMETRIAL ABLATION     ESOPHAGOGASTRODUODENOSCOPY (EGD) WITH PROPOFOL N/A 08/15/2015   Procedure: ESOPHAGOGASTRODUODENOSCOPY (EGD) WITH PROPOFOL;  Surgeon: Christena Deem, MD;  Location: [facility] ENDOSCOPY;  Service: Endoscopy;  Laterality: N/A;   ESOPHAGOGASTRODUODENOSCOPY (EGD) WITH PROPOFOL N/A 01/22/2016   Procedure: ESOPHAGOGASTRODUODENOSCOPY (EGD) WITH PROPOFOL;  Surgeon: Christena Deem, MD;  Location: [facility] ENDOSCOPY;  Service: Endoscopy;  Laterality: N/A;   ESOPHAGOGASTRODUODENOSCOPY (EGD) WITH PROPOFOL N/A 01/06/2020    Procedure: ESOPHAGOGASTRODUODENOSCOPY (EGD) WITH PROPOFOL;  Surgeon: Toledo, Boykin Nearing, MD;  Location: [facility] ENDOSCOPY;  Service: Gastroenterology;  Laterality: N/A;   GASTRIC BYPASS  2005   HERNIA REPAIR     NASAL SINUS SURGERY     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   TOTAL THYROIDECTOMY     TUBAL LIGATION     Social History   Socioeconomic History   Marital status: Divorced    Spouse name: Not on file   Number of children: 2   Years of education: Not on file   Highest education level: GED or equivalent  Occupational History   Occupation: Part-time  Tobacco Use   Smoking status: Every Day    Packs/day: 0.50    Years: 30.00    Additional pack years: 0.00    Total pack years: 15.00    Types: Cigarettes    Start date: 03/15/1980   Smokeless tobacco: Never  Vaping Use   Vaping Use: Never used  Substance and Sexual Activity   Alcohol use: Yes    Alcohol/week: 10.0 - 12.0 standard drinks of alcohol    Types: 8 - 10 Glasses of wine, 2 Standard drinks or equivalent per week   Drug use: No   Sexual activity: Yes    Partners: Male  Other Topics Concern   Not on file  Social History Narrative   Lives in [place] single, divorced       Work - 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       Diet - healthy diet   Exercise - limited   Caffeine use: daily   Social Determinants of Health   Financial Resource Strain: Low Risk  (11/01/2022)   Overall Financial Resource Strain (CARDIA)    Difficulty of Paying Living Expenses: Not very hard  Food Insecurity: No Food Insecurity (11/01/2022)   Hunger Vital Sign    Worried About Running Out of Food in the Last Year: Never true    Ran Out of Food in the Last Year: Never true  Transportation Needs: No Transportation Needs (11/01/2022)   PRAPARE - Administrator, Civil Service (Medical): No    Lack of Transportation (Non-Medical): No  Physical Activity: Insufficiently Active (11/01/2022)   Exercise Vital Sign    Days of  Exercise per Week: 3 days    Minutes of Exercise per Session: 20 min  Stress: Stress Concern Present (11/01/2022)   [employer] of Occupational Health - Occupational Stress Questionnaire    Feeling of Stress : Very much  Social Connections: Socially Isolated (11/01/2022)   Social Connection and Isolation Panel [NHANES]    Frequency of Communication with Friends and Family: More than three times a week    Frequency of Social Gatherings with Friends and Family: Once a week    Attends Religious Services: Never  Active Member of Clubs or Organizations: No    Attends Banker Meetings: Not on file    Marital Status: Divorced  Intimate Partner Violence: Not At Risk (05/19/2017)   Humiliation, Afraid, Rape, and Kick questionnaire    Fear of Current or Ex-Partner: No    Emotionally Abused: No    Physically Abused: No    Sexually Abused: No   Family History  Problem Relation Age of Onset   Heart disease Mother    COPD Mother    Arthritis Mother    Cancer Mother        uterine   Lupus Mother    Anxiety disorder Mother    Depression Mother    Drug abuse Mother    COPD Father    Arthritis Father    Heart disease Father    Heart attack Father    Alcohol abuse Father    Heart disease Brother    Heart attack Brother    Drug abuse Brother    Anxiety disorder Brother    Depression Brother    Heart disease Brother    Cancer Brother        liver cancer age 59 y.o   Diabetes Maternal Grandmother    Diabetes Maternal Grandfather    Diabetes Paternal Grandmother    Diabetes Paternal Grandfather    Breast cancer Neg Hx    Current Outpatient Medications on File Prior to Visit  Medication Sig   albuterol (VENTOLIN HFA) 108 (90 Base) MCG/ACT inhaler Inhale 2 puffs into the lungs every 6 (six) hours as needed for wheezing or shortness of breath.   amLODipine (NORVASC) 10 MG tablet Take 1 tablet (10 mg total) by mouth daily.   budesonide-formoterol (SYMBICORT) 160-4.5  MCG/ACT inhaler Inhale 2 puffs into the lungs 2 (two) times daily. Rinse mouth   cetirizine (ZYRTEC) 10 MG tablet Take 1 tablet (10 mg total) by mouth daily.   cyanocobalamin (VITAMIN B12) 1000 MCG/ML injection INJECT 1 ML IN THE MUSCLE EVERY 30 DAYS   cyclobenzaprine (FLEXERIL) 5 MG tablet Take 1 tablet (5 mg total) by mouth at bedtime as needed for muscle spasms.   levothyroxine (SYNTHROID) 125 MCG tablet Take 250 mcg by mouth daily before breakfast.   montelukast (SINGULAIR) 10 MG tablet Take 1 tablet (10 mg total) by mouth at bedtime.   pantoprazole (PROTONIX) 40 MG tablet 30 min before lunch or dinner   SYRINGE-NEEDLE, DISP, 3 ML 25G X 1-1/2" 3 ML MISC 1 Device by Does not apply route every 30 (thirty) days. With B12 shot   telmisartan (MICARDIS) 80 MG tablet 1 whole pill qd goal BP <130/<80.   Vitamin D, Ergocalciferol, (DRISDOL) 1.25 MG (50000 UNIT) CAPS capsule Take 50,000 Units by mouth every 7 (seven) days.   ondansetron (ZOFRAN) 8 MG tablet Take 1 tablet (8 mg total) by mouth every 8 (eight) hours as needed for nausea or vomiting. (Patient not taking: Reported on 11/20/2022)   No current facility-administered medications on file prior to visit.    Review of Systems Per HPI unless specifically indicated above      Objective:    BP 132/78 (BP Location: Right Arm, Patient Position: Sitting, Cuff Size: Normal)   Pulse 77   Temp 97.8 F (36.6 C) (Oral)   Resp 20   Ht 5' 1.5" (1.562 m)   Wt 168 lb 6.4 oz (76.4 kg)   SpO2 94%   BMI 31.30 kg/m   Wt Readings from Last 3 Encounters:  11/20/22 168 lb 6.4 oz (76.4 kg)  11/01/22 161 lb 12.8 oz (73.4 kg)  10/01/22 164 lb (74.4 kg)    Physical Exam Vitals and nursing note reviewed.  Constitutional:      General: She is not in acute distress.    Appearance: Normal appearance. She is well-developed. She is not diaphoretic.     Comments: Well-appearing, comfortable, cooperative  HENT:     Head: Normocephalic and atraumatic.  Eyes:      General:        Right eye: No discharge.        Left eye: No discharge.     Conjunctiva/sclera: Conjunctivae normal.  Cardiovascular:     Rate and Rhythm: Normal rate.  Pulmonary:     Effort: Pulmonary effort is normal. No respiratory distress.     Breath sounds: No wheezing or rales.     Comments: Mild reduced air movement diffusely. No obvious wheezing some tight sounds Skin:    General: Skin is warm and dry.     Findings: No erythema or rash.  Neurological:     Mental Status: She is alert and oriented to person, place, and time.  Psychiatric:        Mood and Affect: Mood normal.        Behavior: Behavior normal.        Thought Content: Thought content normal.     Comments: Well groomed, good eye contact, normal speech and thoughts    Results for orders placed or performed in visit on 09/10/22  Kappa/lambda light chains  Result Value Ref Range   Kappa free light chain 12.1 3.3 - 19.4 mg/L   Lambda free light chains 308.9 (H) 5.7 - 26.3 mg/L   Kappa, lambda light chain ratio 0.04 (L) 0.26 - 1.65  Multiple Myeloma Panel (SPEP&IFE w/QIG)  Result Value Ref Range   IgG (Immunoglobin G), Serum 3,227 (H) 586 - 1,602 mg/dL   IgA 17 (L) 87 - 161 mg/dL   IgM (Immunoglobulin M), Srm 31 26 - 217 mg/dL   Total Protein ELP 8.3 6.0 - 8.5 g/dL   Albumin SerPl Elph-Mcnc 3.6 2.9 - 4.4 g/dL   Alpha 1 0.2 0.0 - 0.4 g/dL   Alpha2 Glob SerPl Elph-Mcnc 0.6 0.4 - 1.0 g/dL   B-Globulin SerPl Elph-Mcnc 3.5 (H) 0.7 - 1.3 g/dL   Gamma Glob SerPl Elph-Mcnc 0.2 (L) 0.4 - 1.8 g/dL   M Protein SerPl Elph-Mcnc 3.5 (H) Not Observed g/dL   Globulin, Total 4.7 (H) 2.2 - 3.9 g/dL   Albumin/Glob SerPl 0.8 0.7 - 1.7   IFE 1 Comment (A)    Please Note Comment   Comprehensive metabolic panel  Result Value Ref Range   Sodium 134 (L) 135 - 145 mmol/L   Potassium 4.0 3.5 - 5.1 mmol/L   Chloride 101 98 - 111 mmol/L   CO2 26 22 - 32 mmol/L   Glucose, Bld 89 70 - 99 mg/dL   BUN 15 6 - 20 mg/dL    Creatinine, Ser 0.96 0.44 - 1.00 mg/dL   Calcium 04.5 8.9 - 40.9 mg/dL   Total Protein 8.6 (H) 6.5 - 8.1 g/dL   Albumin 3.0 (L) 3.5 - 5.0 g/dL   AST 20 15 - 41 U/L   ALT 17 0 - 44 U/L   Alkaline Phosphatase 48 38 - 126 U/L   Total Bilirubin 0.4 0.3 - 1.2 mg/dL   GFR, Estimated >81 >19 mL/min   Anion gap 7 5 -  15  CBC with Differential/Platelet  Result Value Ref Range   WBC 7.7 4.0 - 10.5 K/uL   RBC 4.06 3.87 - 5.11 MIL/uL   Hemoglobin 12.8 12.0 - 15.0 g/dL   HCT 14.7 82.9 - 56.2 %   MCV 93.6 80.0 - 100.0 fL   MCH 31.5 26.0 - 34.0 pg   MCHC 33.7 30.0 - 36.0 g/dL   RDW 13.0 86.5 - 78.4 %   Platelets 281 150 - 400 K/uL   nRBC 0.0 0.0 - 0.2 %   Neutrophils Relative % 52 %   Neutro Abs 4.1 1.7 - 7.7 K/uL   Lymphocytes Relative 38 %   Lymphs Abs 2.9 0.7 - 4.0 K/uL   Monocytes Relative 8 %   Monocytes Absolute 0.6 0.1 - 1.0 K/uL   Eosinophils Relative 1 %   Eosinophils Absolute 0.1 0.0 - 0.5 K/uL   Basophils Relative 1 %   Basophils Absolute 0.0 0.0 - 0.1 K/uL   Immature Granulocytes 0 %   Abs Immature Granulocytes 0.01 0.00 - 0.07 K/uL      Assessment & Plan:   Problem List Items Addressed This Visit     Anxiety   Relevant Medications   clonazePAM (KLONOPIN) 1 MG tablet   escitalopram (LEXAPRO) 10 MG tablet   Other Relevant Orders   Ambulatory referral to Psychiatry   B12 deficiency   Benign essential hypertension - Primary   Hyperlipidemia   Hypothyroidism   Relevant Medications   levothyroxine (SYNTHROID) 125 MCG tablet   Insomnia   Relevant Medications   zolpidem (AMBIEN) 10 MG tablet   escitalopram (LEXAPRO) 10 MG tablet   Other Relevant Orders   Ambulatory referral to Psychiatry   MDD (major depressive disorder), recurrent episode, moderate (HCC)   Relevant Medications   escitalopram (LEXAPRO) 10 MG tablet   Other Relevant Orders   Ambulatory referral to Psychiatry   Postoperative hypothyroidism   Relevant Medications   levothyroxine (SYNTHROID) 125 MCG  tablet   Prediabetes   Smoldering multiple myeloma   Relevant Medications   clonazePAM (KLONOPIN) 1 MG tablet   Tobacco use disorder   Relevant Orders   Ambulatory referral to Pulmonology   Other Visit Diagnoses     Encounter to establish care with new doctor       Asthma-COPD overlap syndrome       Relevant Orders   Ambulatory referral to Pulmonology   Dyspnea on exertion       Relevant Orders   Ambulatory referral to Pulmonology       Establish care with new PCP Review outside records  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 Hold Symbicort - Trial Free Sample [place] today, 1 week, 2 puff TWICE A DAY dosing, may order if preferred, patient will contact us back.  Generalized Anxiety w Panic Insomnia Major Depression recurrent Acute on chronic problem with recent passing of her mother, and fam health issues causing significant stressors for her. She has been on BDZ for years, previously managed on Clonazepam. Back on med, needs to continue Will agree to continue Clonazepam 1mg  twice a day, add refills Add Zolpidem 10mg  dose can take half for 5mg  if prefer, AT NIGHT insomnia, for now acutely given situation with inc stressors Phase off of Fluoxetine 40mg , seems ineffective, taper per AVS over 2 weeks then start new SSRI Lexapro escitalopram 10mg  daily  Referral to [facility])  Other topics reviewed  Hypothyroidism Followed by [facility] Endocrine Dr Gershon Crane, keep current dosing Levothyroxine x 2 = 250  Orders Placed This Encounter  Procedures   Ambulatory referral to Pulmonology    Referral Priority:   Routine    Referral Type:   Consultation    Referral Reason:   Specialty Services Required    Requested Specialty:   Pulmonary Disease    Number of Visits Requested:   1   Ambulatory  referral to Psychiatry    Referral Priority:   Routine    Referral Type:   Psychiatric    Referral Reason:   Specialty Services Required    Requested Specialty:   Psychiatry    Number of Visits Requested:   1     Meds ordered this encounter  Medications   clonazePAM (KLONOPIN) 1 MG tablet    Sig: Take 1 tablet (1 mg total) by mouth 2 (two) times daily.    Dispense:  60 tablet    Refill:  2   zolpidem (AMBIEN) 10 MG tablet    Sig: Take 0.5-1 tablets (5-10 mg total) by mouth at bedtime as needed for sleep.    Dispense:  30 tablet    Refill:  2   escitalopram (LEXAPRO) 10 MG tablet    Sig: Take 1 tablet (10 mg total) by mouth daily.    Dispense:  30 tablet    Refill:  2      Follow up plan: Return in about 6 weeks (around 01/01/2023) for 6 weeks follow-up Anxiety, Mood, Insomnia - updates.  Saralyn Pilar, DO [facility] 11/20/2022, 10:37 AM

## 2022-11-20 NOTE — Patient Instructions (Addendum)
Thank you for coming to the office today.  They will call you with apt. If you don't hear back within 2 weeks, you can call them.  [facility] [address], [address] [place], [place] [address] Phone: [phone]  Trial on Breztri 2 puff twice a day 7 day sample Message or call if you want me to order the new inhaler.  [facility] (Adult, Peds, Geriatric, Counseling) [address], [address] [place], [place] [address] Phone: [phone] Fax: [phone]  Switch anti depressant  Fluoxetine 40 > 20 > 40 > 20 for 1 week Next week 20 every day 3rd week - start new pill Lexapro 10mg  and stop Fluoxetine   Please schedule a Follow-up Appointment to: Return in about 6 weeks (around 01/01/2023) for 6 weeks follow-up Anxiety, Mood, Insomnia - updates.  If you have any other questions or concerns, please feel free to call the office or send a message through MyChart. You may also schedule an earlier appointment if necessary.  Additionally, you may be receiving a survey about your experience at our office within a few days to 1 week by e-mail or mail. We value your feedback.  Saralyn Pilar, DO [facility], [place]

## 2022-11-26 ENCOUNTER — Encounter: Payer: Self-pay | Admitting: Oncology

## 2022-12-03 ENCOUNTER — Ambulatory Visit: Payer: Managed Care, Other (non HMO) | Admitting: Nurse Practitioner

## 2022-12-13 ENCOUNTER — Ambulatory Visit: Payer: Managed Care, Other (non HMO) | Admitting: Student in an Organized Health Care Education/Training Program

## 2022-12-13 ENCOUNTER — Encounter: Payer: Self-pay | Admitting: Student in an Organized Health Care Education/Training Program

## 2022-12-13 VITALS — BP 150/90 | HR 99 | Temp 97.6°F | Ht 61.5 in | Wt 173.8 lb

## 2022-12-13 DIAGNOSIS — F17200 Nicotine dependence, unspecified, uncomplicated: Secondary | ICD-10-CM

## 2022-12-13 DIAGNOSIS — R0602 Shortness of breath: Secondary | ICD-10-CM

## 2022-12-13 DIAGNOSIS — F17210 Nicotine dependence, cigarettes, uncomplicated: Secondary | ICD-10-CM

## 2022-12-13 LAB — NITRIC OXIDE: Nitric Oxide: 5

## 2022-12-13 MED ORDER — AEROCHAMBER MV MISC
0 refills | Status: AC
Start: 2022-12-13 — End: ?

## 2022-12-13 MED ORDER — NICOTINE POLACRILEX 2 MG MT LOZG
2.0000 mg | LOZENGE | OROMUCOSAL | 3 refills | Status: DC | PRN
Start: 2022-12-13 — End: 2023-02-06

## 2022-12-13 MED ORDER — NICOTINE 21 MG/24HR TD PT24
21.0000 mg | MEDICATED_PATCH | TRANSDERMAL | 0 refills | Status: DC
Start: 2022-12-13 — End: 2023-02-06

## 2022-12-13 MED ORDER — NICOTINE 14 MG/24HR TD PT24
14.0000 mg | MEDICATED_PATCH | TRANSDERMAL | 0 refills | Status: AC
Start: 2022-12-13 — End: 2022-12-27

## 2022-12-13 MED ORDER — BREZTRI AEROSPHERE 160-9-4.8 MCG/ACT IN AERO
2.0000 | INHALATION_SPRAY | Freq: Two times a day (BID) | RESPIRATORY_TRACT | 12 refills | Status: DC
Start: 2022-12-13 — End: 2023-02-06

## 2022-12-13 MED ORDER — NICOTINE 7 MG/24HR TD PT24
7.0000 mg | MEDICATED_PATCH | TRANSDERMAL | 0 refills | Status: AC
Start: 2022-12-13 — End: 2022-12-27

## 2022-12-13 NOTE — Telephone Encounter (Signed)
Spoke to [facility]. They have the 14mg  and 21mg  of nicotine patches on file. They will fill the 14mg  once patient completed the 7mg .

## 2022-12-13 NOTE — Telephone Encounter (Signed)
Dr. Dgayli, please advise. Thanks   

## 2022-12-13 NOTE — Telephone Encounter (Signed)
The pharmacy did not receive the Rx for  the nicotine patch's 21 mg or 14mg  and the Nicotine polacrilex. Pt is going out of town Monday  Pharmacy: [facility] in [place]

## 2022-12-13 NOTE — Telephone Encounter (Signed)
Dr. Aundria Rud, please advise if patient was supposed to start with 21mg  or 7mg ?   Spoke to [facility] and they have Nicotine polacrilex. Rx will be readyf or pickup after 11am tomorrow.

## 2022-12-13 NOTE — Telephone Encounter (Signed)
The prescriptions were def sent. Can we call the pharmacy and check if we need to send again?

## 2022-12-13 NOTE — Progress Notes (Signed)
Assessment & Plan:   1. SOB (shortness of breath)  Presents for the evaluation of shortness of breath, cough, and wheeze in the setting of smoking history as well as note of asthma and COPD in the medical record. She's not had pulmonary function testing to confirm said diagnosis. FENO today is low at 5 ppb and lungs are clear on auscultation. I have asked the patient to show me how she uses her Breztri, and while it is correct, I suspect there's room for improvement (in terms of technique and compliance).  I agree with empiric trial of Breztri which I will continue and refill. This will allow for asthma coverage as well as COPD. I will obtain pulmonary function testing to confirm the diagnosis and have sent a prescription for a spacer device to the patient's pharmacy. She is a smoker and would benefit from LDCT for lung cancer screening.  - Nitric oxide - Pulmonary Function Test [facility] Only; Future - Spacer/Aero-Holding Chambers (AEROCHAMBER MV) inhaler; Use as instructed  Dispense: 1 each; Refill: 0 - Ambulatory Referral for Lung Cancer Scre - Budeson-Glycopyrrol-Formoterol (BREZTRI AEROSPHERE) 160-9-4.8 MCG/ACT AERO; Inhale 2 puffs into the lungs in the morning and at bedtime.  Dispense: 10.7 g; Refill: 12  2. Tobacco use disorder  Smokes 1 pack per day and smoking cessation counseling was provided. Patient willing to try and quit. Will initiate nicotine patches and lozenges to help with her effort.  - nicotine (NICODERM CQ - DOSED IN MG/24 HOURS) 14 mg/24hr patch; Place 1 patch (14 mg total) onto the skin daily for 14 days.  Dispense: 14 patch; Refill: 0 - nicotine (NICODERM CQ - DOSED IN MG/24 HOURS) 21 mg/24hr patch; Place 1 patch (21 mg total) onto the skin daily.  Dispense: 42 patch; Refill: 0 - nicotine (NICODERM CQ - DOSED IN MG/24 HR) 7 mg/24hr patch; Place 1 patch (7 mg total) onto the skin daily for 14 days.  Dispense: 14 patch; Refill: 0 - nicotine polacrilex (NICOTINE MINI) 2  MG lozenge; Take 1 lozenge (2 mg total) by mouth every 2 (two) hours as needed for smoking cessation.  Dispense: 72 lozenge; Refill: 3   Return in about 3 months (around 03/15/2023).  I spent 60 minutes caring for this patient today, including preparing to see the patient, obtaining a medical history , reviewing a separately obtained history, performing a medically appropriate examination and/or evaluation, counseling and educating the patient/family/caregiver, ordering medications, tests, or procedures, documenting clinical information in the electronic health record, and independently interpreting results (not separately reported/billed) and communicating results to the patient/family/caregiver  Raechel Chute, MD [facility] 12/13/2022 11:37 AM    End of visit medications:  Meds ordered this encounter  Medications   Spacer/Aero-Holding Chambers (AEROCHAMBER MV) inhaler    Sig: Use as instructed    Dispense:  1 each    Refill:  0   nicotine (NICODERM CQ - DOSED IN MG/24 HOURS) 14 mg/24hr patch    Sig: Place 1 patch (14 mg total) onto the skin daily for 14 days.    Dispense:  14 patch    Refill:  0   nicotine (NICODERM CQ - DOSED IN MG/24 HOURS) 21 mg/24hr patch    Sig: Place 1 patch (21 mg total) onto the skin daily.    Dispense:  42 patch    Refill:  0   nicotine (NICODERM CQ - DOSED IN MG/24 HR) 7 mg/24hr patch    Sig: Place 1 patch (7 mg  total) onto the skin daily for 14 days.    Dispense:  14 patch    Refill:  0   nicotine polacrilex (NICOTINE MINI) 2 MG lozenge    Sig: Take 1 lozenge (2 mg total) by mouth every 2 (two) hours as needed for smoking cessation.    Dispense:  72 lozenge    Refill:  3   Budeson-Glycopyrrol-Formoterol (BREZTRI AEROSPHERE) 160-9-4.8 MCG/ACT AERO    Sig: Inhale 2 puffs into the lungs in the morning and at bedtime.    Dispense:  10.7 g    Refill:  12     Current Outpatient Medications:    albuterol (VENTOLIN HFA) 108 (90  Base) MCG/ACT inhaler, Inhale 2 puffs into the lungs every 6 (six) hours as needed for wheezing or shortness of breath., Disp: 18 g, Rfl: 11   amLODipine (NORVASC) 10 MG tablet, Take 1 tablet (10 mg total) by mouth daily., Disp: 90 tablet, Rfl: 3   Budeson-Glycopyrrol-Formoterol (BREZTRI AEROSPHERE) 160-9-4.8 MCG/ACT AERO, Inhale 2 puffs into the lungs in the morning and at bedtime., Disp: 10.7 g, Rfl: 12   cetirizine (ZYRTEC) 10 MG tablet, Take 1 tablet (10 mg total) by mouth daily., Disp: 90 tablet, Rfl: 3   clonazePAM (KLONOPIN) 1 MG tablet, Take 1 tablet (1 mg total) by mouth 2 (two) times daily., Disp: 60 tablet, Rfl: 2   cyanocobalamin (VITAMIN B12) 1000 MCG/ML injection, INJECT 1 ML IN THE MUSCLE EVERY 30 DAYS, Disp: 4 mL, Rfl: 12   cyclobenzaprine (FLEXERIL) 5 MG tablet, Take 1 tablet (5 mg total) by mouth at bedtime as needed for muscle spasms., Disp: 90 tablet, Rfl: 1   escitalopram (LEXAPRO) 10 MG tablet, Take 1 tablet (10 mg total) by mouth daily., Disp: 30 tablet, Rfl: 2   levothyroxine (SYNTHROID) 125 MCG tablet, Take 250 mcg by mouth daily before breakfast., Disp: , Rfl:    montelukast (SINGULAIR) 10 MG tablet, Take 1 tablet (10 mg total) by mouth at bedtime., Disp: 90 tablet, Rfl: 3   nicotine (NICODERM CQ - DOSED IN MG/24 HOURS) 14 mg/24hr patch, Place 1 patch (14 mg total) onto the skin daily for 14 days., Disp: 14 patch, Rfl: 0   nicotine (NICODERM CQ - DOSED IN MG/24 HOURS) 21 mg/24hr patch, Place 1 patch (21 mg total) onto the skin daily., Disp: 42 patch, Rfl: 0   nicotine (NICODERM CQ - DOSED IN MG/24 HR) 7 mg/24hr patch, Place 1 patch (7 mg total) onto the skin daily for 14 days., Disp: 14 patch, Rfl: 0   nicotine polacrilex (NICOTINE MINI) 2 MG lozenge, Take 1 lozenge (2 mg total) by mouth every 2 (two) hours as needed for smoking cessation., Disp: 72 lozenge, Rfl: 3   ondansetron (ZOFRAN) 8 MG tablet, Take 1 tablet (8 mg total) by mouth every 8 (eight) hours as needed for  nausea or vomiting., Disp: 60 tablet, Rfl: 0   pantoprazole (PROTONIX) 40 MG tablet, 30 min before lunch or dinner, Disp: 90 tablet, Rfl: 3   Spacer/Aero-Holding Chambers (AEROCHAMBER MV) inhaler, Use as instructed, Disp: 1 each, Rfl: 0   SYRINGE-NEEDLE, DISP, 3 ML 25G X 1-1/2" 3 ML MISC, 1 Device by Does not apply route every 30 (thirty) days. With B12 shot, Disp: 12 each, Rfl: 1   telmisartan (MICARDIS) 80 MG tablet, 1 whole pill qd goal BP <130/<80., Disp: 90 tablet, Rfl: 3   Vitamin D, Ergocalciferol, (DRISDOL) 1.25 MG (50000 UNIT) CAPS capsule, Take 50,000 Units by mouth every 7 (seven)  days., Disp: , Rfl:    zolpidem (AMBIEN) 10 MG tablet, Take 0.5-1 tablets (5-10 mg total) by mouth at bedtime as needed for sleep., Disp: 30 tablet, Rfl: 2   Subjective:   PATIENT ID: Paula Garrett GENDER: female DOB: 05-09-1966, MRN: [id number]  Chief Complaint  Patient presents with   pulmonary consult    SOB with exertion and with laying flat and prod cough with clear sputum.     HPI  Patient is a pleasant 57 year old female presenting to clinic for the evaluation of shortness of breath.  Patient reports symptoms of over 6 months. These mostly consist of exertional dyspnea, shortness at breath with rest, cough, wheeze, and sputum production. The cough is productive of whitish sputum and she denies any hemoptysis or yellow/green colored sputum. Reports wheeze that is occasional and a rattling in her chest. No chest pain or chest tightness reported, no fevers or chills, and no night sweats. Patient also reports that her mother passed away recently and that has made her anxiety worse which is contributing to her symptoms.  Patient was seen by her primary care physician for said symptoms and a CXR ordered was negative. She was given a sample of Breztri that she's used intermittently without noticing any relief. She continues to smoke cigarettes.  She has a long standing history of smoking, and has smoked  a pack a day for over 30 years. She works as a Industrial/product designer. She doesn't have any other occupational exposures and denies illicit drug use.  Ancillary information including prior medications, full medical/surgical/family/social histories, and PFTs (when available) are listed below and have been reviewed.   Review of Systems  Constitutional:  Negative for chills, fever, malaise/fatigue and weight loss.  Respiratory:  Positive for cough, sputum production, shortness of breath and wheezing. Negative for hemoptysis.   Cardiovascular:  Negative for chest pain.  Skin:  Negative for rash.     Objective:   Vitals:   12/13/22 1109  BP: (!) 150/90  Pulse: 99  Temp: 97.6 F (36.4 C)  TempSrc: Temporal  SpO2: 94%  Weight: 173 lb 12.8 oz (78.8 kg)  Height: 5' 1.5" (1.562 m)   94% on RA BMI Readings from Last 3 Encounters:  12/13/22 32.31 kg/m  11/20/22 31.30 kg/m  11/01/22 28.66 kg/m   Wt Readings from Last 3 Encounters:  12/13/22 173 lb 12.8 oz (78.8 kg)  11/20/22 168 lb 6.4 oz (76.4 kg)  11/01/22 161 lb 12.8 oz (73.4 kg)    Physical Exam Constitutional:      Appearance: Normal appearance. She is obese.  Eyes:     Pupils: Pupils are equal, round, and reactive to light.  Cardiovascular:     Rate and Rhythm: Normal rate and regular rhythm.  Pulmonary:     Effort: Pulmonary effort is normal.     Breath sounds: Normal breath sounds. No stridor. No wheezing or rales.  Neurological:     General: No focal deficit present.     Mental Status: She is alert and oriented to Garrett, place, and time. Mental status is at baseline.       Ancillary Information    Past Medical History:  Diagnosis Date   Anxiety    Arthritis    Asthma    Barrett's esophagus 2015   COVID-19    03/10/20   Depression    GERD (gastroesophageal reflux disease)    Hypertension    Hypothyroidism    Pneumonia    Smoldering  myeloma    Thyroid disease      Family History  Problem Relation Age of  Onset   Heart disease Mother    COPD Mother    Arthritis Mother    Cancer Mother        uterine   Lupus Mother    Anxiety disorder Mother    Depression Mother    Drug abuse Mother    COPD Father    Arthritis Father    Heart disease Father    Heart attack Father    Alcohol abuse Father    Heart disease Brother    Heart attack Brother    Drug abuse Brother    Anxiety disorder Brother    Depression Brother    Heart disease Brother    Cancer Brother        liver cancer age 78 y.o   Diabetes Maternal Grandmother    Diabetes Maternal Grandfather    Diabetes Paternal Grandmother    Diabetes Paternal Grandfather    Breast cancer Neg Hx      Past Surgical History:  Procedure Laterality Date   ABLATION  2006   BREAST CYST ASPIRATION Left 1998   BREAST SURGERY     cyst aspiration   BREAST SURGERY  1998   cyst aspiration   COLONOSCOPY  03/2014   COLONOSCOPY WITH PROPOFOL N/A 01/06/2020   Procedure: COLONOSCOPY WITH PROPOFOL;  Surgeon: Toledo, Boykin Nearing, MD;  Location: [facility] ENDOSCOPY;  Service: Gastroenterology;  Laterality: N/A;   ENDOMETRIAL ABLATION     ESOPHAGOGASTRODUODENOSCOPY (EGD) WITH PROPOFOL N/A 08/15/2015   Procedure: ESOPHAGOGASTRODUODENOSCOPY (EGD) WITH PROPOFOL;  Surgeon: Christena Deem, MD;  Location[facility] ENDOSCOPY;  Service: Endoscopy;  Laterality: N/A;   ESOPHAGOGASTRODUODENOSCOPY (EGD) WITH PROPOFOL N/A 01/22/2016   Procedure: ESOPHAGOGASTRODUODENOSCOPY (EGD) WITH PROPOFOL;  Surgeon: Christena Deem, MD;  Location: [facility] ENDOSCOPY;  Service: Endoscopy;  Laterality: N/A;   ESOPHAGOGASTRODUODENOSCOPY (EGD) WITH PROPOFOL N/A 01/06/2020   Procedure: ESOPHAGOGASTRODUODENOSCOPY (EGD) WITH PROPOFOL;  Surgeon: Toledo, Boykin Nearing, MD;  Location: [facility] ENDOSCOPY;  Service: Gastroenterology;  Laterality: N/A;   GASTRIC BYPASS  2005   HERNIA REPAIR     NASAL SINUS SURGERY     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   TOTAL THYROIDECTOMY     TUBAL LIGATION      Social  History   Socioeconomic History   Marital status: Divorced    Spouse name: Not on file   Number of children: 2   Years of education: Not on file   Highest education level: GED or equivalent  Occupational History   Occupation: Part-time  Tobacco Use   Smoking status: Every Day    Packs/day: 1.00    Years: 30.00    Additional pack years: 0.00    Total pack years: 30.00    Types: Cigarettes    Start date: 03/15/1980   Smokeless tobacco: Never  Vaping Use   Vaping Use: Never used  Substance and Sexual Activity   Alcohol use: Yes    Alcohol/week: 10.0 - 12.0 standard drinks of alcohol    Types: 8 - 10 Glasses of wine, 2 Standard drinks or equivalent per week   Drug use: No   Sexual activity: Yes    Partners: Male  Other Topics Concern   Not on file  Social History Narrative   Lives in [place] single, divorced       Work - 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  Diet - healthy diet   Exercise - limited   Caffeine use: daily   Social Determinants of Health   Financial Resource Strain: Low Risk  (11/01/2022)   Overall Financial Resource Strain (CARDIA)    Difficulty of Paying Living Expenses: Not very hard  Food Insecurity: No Food Insecurity (11/01/2022)   Hunger Vital Sign    Worried About Running Out of Food in the Last Year: Never true    Ran Out of Food in the Last Year: Never true  Transportation Needs: No Transportation Needs (11/01/2022)   PRAPARE - Administrator, Civil Service (Medical): No    Lack of Transportation (Non-Medical): No  Physical Activity: Insufficiently Active (11/01/2022)   Exercise Vital Sign    Days of Exercise per Week: 3 days    Minutes of Exercise per Session: 20 min  Stress: Stress Concern Present (11/01/2022)   [employer] of Occupational Health - Occupational Stress Questionnaire    Feeling of Stress : Very much  Social Connections: Socially Isolated (11/01/2022)   Social Connection and Isolation Panel [NHANES]     Frequency of Communication with Friends and Family: More than three times a week    Frequency of Social Gatherings with Friends and Family: Once a week    Attends Religious Services: Never    Database administrator or Organizations: No    Attends Banker Meetings: Not on file    Marital Status: Divorced  Intimate Partner Violence: Not At Risk (05/19/2017)   Humiliation, Afraid, Rape, and Kick questionnaire    Fear of Current or Ex-Partner: No    Emotionally Abused: No    Physically Abused: No    Sexually Abused: No     Allergies  Allergen Reactions   Hctz [Hydrochlorothiazide]     Hyponatremia      CBC    Component Value Date/Time   WBC 7.7 09/10/2022 1329   RBC 4.06 09/10/2022 1329   HGB 12.8 09/10/2022 1329   HGB 13.0 02/14/2012 1528   HCT 38.0 09/10/2022 1329   HCT 38.5 07/02/2011 1529   PLT 281 09/10/2022 1329   PLT 311 07/02/2011 1529   MCV 93.6 09/10/2022 1329   MCV 90 07/02/2011 1529   MCH 31.5 09/10/2022 1329   MCHC 33.7 09/10/2022 1329   RDW 13.0 09/10/2022 1329   RDW 13.4 07/02/2011 1529   LYMPHSABS 2.9 09/10/2022 1329   MONOABS 0.6 09/10/2022 1329   EOSABS 0.1 09/10/2022 1329   BASOSABS 0.0 09/10/2022 1329    Pulmonary Functions Testing Results:     No data to display          Outpatient Medications Prior to Visit  Medication Sig Dispense Refill   albuterol (VENTOLIN HFA) 108 (90 Base) MCG/ACT inhaler Inhale 2 puffs into the lungs every 6 (six) hours as needed for wheezing or shortness of breath. 18 g 11   amLODipine (NORVASC) 10 MG tablet Take 1 tablet (10 mg total) by mouth daily. 90 tablet 3   cetirizine (ZYRTEC) 10 MG tablet Take 1 tablet (10 mg total) by mouth daily. 90 tablet 3   clonazePAM (KLONOPIN) 1 MG tablet Take 1 tablet (1 mg total) by mouth 2 (two) times daily. 60 tablet 2   cyanocobalamin (VITAMIN B12) 1000 MCG/ML injection INJECT 1 ML IN THE MUSCLE EVERY 30 DAYS 4 mL 12   cyclobenzaprine (FLEXERIL) 5 MG tablet Take 1  tablet (5 mg total) by mouth at bedtime as needed for muscle spasms. 90  tablet 1   escitalopram (LEXAPRO) 10 MG tablet Take 1 tablet (10 mg total) by mouth daily. 30 tablet 2   levothyroxine (SYNTHROID) 125 MCG tablet Take 250 mcg by mouth daily before breakfast.     montelukast (SINGULAIR) 10 MG tablet Take 1 tablet (10 mg total) by mouth at bedtime. 90 tablet 3   ondansetron (ZOFRAN) 8 MG tablet Take 1 tablet (8 mg total) by mouth every 8 (eight) hours as needed for nausea or vomiting. 60 tablet 0   pantoprazole (PROTONIX) 40 MG tablet 30 min before lunch or dinner 90 tablet 3   SYRINGE-NEEDLE, DISP, 3 ML 25G X 1-1/2" 3 ML MISC 1 Device by Does not apply route every 30 (thirty) days. With B12 shot 12 each 1   telmisartan (MICARDIS) 80 MG tablet 1 whole pill qd goal BP <130/<80. 90 tablet 3   Vitamin D, Ergocalciferol, (DRISDOL) 1.25 MG (50000 UNIT) CAPS capsule Take 50,000 Units by mouth every 7 (seven) days.     zolpidem (AMBIEN) 10 MG tablet Take 0.5-1 tablets (5-10 mg total) by mouth at bedtime as needed for sleep. 30 tablet 2   Budeson-Glycopyrrol-Formoterol (BREZTRI AEROSPHERE) 160-9-4.8 MCG/ACT AERO Inhale 2 puffs into the lungs 2 (two) times daily.     budesonide-formoterol (SYMBICORT) 160-4.5 MCG/ACT inhaler Inhale 2 puffs into the lungs 2 (two) times daily. Rinse mouth (Patient not taking: Reported on 12/13/2022) 1 each 11   No facility-administered medications prior to visit.

## 2022-12-31 ENCOUNTER — Other Ambulatory Visit: Payer: Managed Care, Other (non HMO)

## 2023-01-01 ENCOUNTER — Encounter: Payer: Self-pay | Admitting: Family Medicine

## 2023-01-01 ENCOUNTER — Ambulatory Visit: Payer: Managed Care, Other (non HMO) | Admitting: Family Medicine

## 2023-01-01 ENCOUNTER — Other Ambulatory Visit: Payer: Self-pay | Admitting: Family Medicine

## 2023-01-01 VITALS — BP 138/86 | HR 79 | Ht 61.5 in | Wt 175.0 lb

## 2023-01-01 DIAGNOSIS — F331 Major depressive disorder, recurrent, moderate: Secondary | ICD-10-CM

## 2023-01-01 DIAGNOSIS — F419 Anxiety disorder, unspecified: Secondary | ICD-10-CM | POA: Diagnosis not present

## 2023-01-01 DIAGNOSIS — I10 Essential (primary) hypertension: Secondary | ICD-10-CM

## 2023-01-01 DIAGNOSIS — R0781 Pleurodynia: Secondary | ICD-10-CM | POA: Diagnosis not present

## 2023-01-01 DIAGNOSIS — E039 Hypothyroidism, unspecified: Secondary | ICD-10-CM

## 2023-01-01 DIAGNOSIS — E538 Deficiency of other specified B group vitamins: Secondary | ICD-10-CM

## 2023-01-01 DIAGNOSIS — R7303 Prediabetes: Secondary | ICD-10-CM

## 2023-01-01 DIAGNOSIS — M545 Low back pain, unspecified: Secondary | ICD-10-CM | POA: Diagnosis not present

## 2023-01-01 DIAGNOSIS — E785 Hyperlipidemia, unspecified: Secondary | ICD-10-CM

## 2023-01-01 DIAGNOSIS — Z0000 Encounter for general adult medical examination without abnormal findings: Secondary | ICD-10-CM

## 2023-01-01 DIAGNOSIS — S2232XD Fracture of one rib, left side, subsequent encounter for fracture with routine healing: Secondary | ICD-10-CM

## 2023-01-01 MED ORDER — HYDROCODONE-ACETAMINOPHEN 5-325 MG PO TABS
1.0000 | ORAL_TABLET | ORAL | 0 refills | Status: DC | PRN
Start: 2023-01-01 — End: 2023-01-08

## 2023-01-01 NOTE — Patient Instructions (Addendum)
Thank you for coming to the office today.  Take Hydrocodone as needed for pain for the back and ribs.  Let me know if interested to repeat X-ray  Heating pad and ice packs as needed.  I am glad you are doing well with Psychiatry now and mood improved. Keep on track with them.   DUE for FASTING BLOOD WORK (no food or drink after midnight before the lab appointment, only water or coffee without cream/sugar on the morning of)  SCHEDULE "Lab Only" visit in the morning at the clinic for lab draw in 4 MONTHS   - Make sure Lab Only appointment is at about 1 week before your next appointment, so that results will be available  For Lab Results, once available within 2-3 days of blood draw, you can can log in to MyChart online to view your results and a brief explanation. Also, we can discuss results at next follow-up visit.   Please schedule a Follow-up Appointment to: Return in about 4 months (around 05/04/2023) for 4 month fasting lab only then 1 week later Annual Physical.  If you have any other questions or concerns, please feel free to call the office or send a message through MyChart. You may also schedule an earlier appointment if necessary.  Additionally, you may be receiving a survey about your experience at our office within a few days to 1 week by e-mail or mail. We value your feedback.  Saralyn Pilar, DO [facility], [place]

## 2023-01-01 NOTE — Progress Notes (Signed)
Subjective:    Patient ID: Paula Garrett, female    DOB: Apr 27, 1966, 57 y.o.   MRN: [id number]  Paula Garrett is a 57 y.o. female presenting on 01/01/2023 for Anxiety and Back Pain Larey Seat 12/28/2022 in a kitchen on a tile floor)   HPI  Left Rib Fracture / Rib Pain History of Back Pain / History of Sciatica  Prior rib fracture unsure when, May 2024. Injury with lifting and straining helping her mother. Severe coughing spell in July, 3rd-4th and then recent fall injury slipped on tile floor 12/28/22 landed on back.  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 Current Meds - Amlodipine 10mg  daily, Telmisartan 80mg  daily Reports good compliance, took meds today. Tolerating well, w/o complaints. Denies CP, dyspnea, HA, edema, dizziness / lightheadedness   Generalized Anxiety Disorder, Panic Attacks  Insomnia Major Depression recurrent moderate  Followed by new Psychiatry now since last visit after referral. Established with Psychiatry at [facility] Continued on Lexapro 10mg  daily, Zolpidem 10mg  She has done well with Psych. She is getting along with them well and off to a good start   Seasonal Environmental Allergies Question with allergy medications   Asthma-COPD Overlap Tobacco Use disorder She has followed with Dr Tommie Ard Pulm, upcoming imaging and management Nicotine patches On Breztri    Smoldering Multiple Myeloma S/p Gastric Bypass   Abnormal lab results and Vitamin Deficiency   Hypothyroidism Followed by [facility] Endocrinology Dr Gershon Crane She has high dose since difficult due to absorption On Levothyroxine 125 mcg x 2 = 250 mcg      01/01/2023    8:50 AM 11/20/2022   10:27 AM 11/01/2022    1:10 PM  Depression screen PHQ 2/9  Decreased Interest 1 2 3   Down,  Depressed, Hopeless 1 2 3   PHQ - 2 Score 2 4 6   Altered sleeping 1 2 3   Tired, decreased energy 1 2 2   Change in appetite 2 2 1   Feeling bad or failure about yourself  1 2 2   Trouble concentrating 2 3 3   Moving slowly or fidgety/restless 3 3 1   Suicidal thoughts 0 0 0  PHQ-9 Score 12 18 18   Difficult doing work/chores Somewhat difficult Very difficult Somewhat difficult      01/01/2023    8:50 AM 11/20/2022   11:23 AM 11/01/2022    1:10 PM 09/27/2022    1:37 PM  GAD 7 : Generalized Anxiety Score  Nervous, Anxious, on Edge 1 3 3 3   Control/stop worrying 1 3 2 3   Worry too much - different things 1 3 2 3   Trouble relaxing 1 3 2 3   Restless 0 1 2 3   Easily annoyed or irritable 1 1 2 3   Afraid - awful might happen 2 1 2 3   Total GAD 7 Score 7 15 15 21   Anxiety Difficulty  Somewhat difficult Somewhat difficult Extremely difficult      Social History   Tobacco Use   Smoking status: Every Day    Current packs/day: 1.00    Average packs/day: 1 pack/day for 42.8 years (42.8 ttl pk-yrs)    Types: Cigarettes    Start date: 03/15/1980   Smokeless tobacco: Never  Vaping Use   Vaping status: Never Used  Substance Use Topics   Alcohol use: Yes  Alcohol/week: 10.0 - 12.0 standard drinks of alcohol    Types: 8 - 10 Glasses of wine, 2 Standard drinks or equivalent per week   Drug use: No    Review of Systems Per HPI unless specifically indicated above     Objective:    BP 138/86 (BP Location: Left Arm, Cuff Size: Normal)   Pulse 79   Ht 5' 1.5" (1.562 m)   Wt 175 lb (79.4 kg)   SpO2 95%   BMI 32.53 kg/m   Wt Readings from Last 3 Encounters:  01/01/23 175 lb (79.4 kg)  12/13/22 173 lb 12.8 oz (78.8 kg)  11/20/22 168 lb 6.4 oz (76.4 kg)    Physical Exam Vitals and nursing note reviewed.  Constitutional:      General: She is not in acute distress.    Appearance: She is well-developed. She is obese. She is not diaphoretic.     Comments: Well-appearing, comfortable,  cooperative  HENT:     Head: Normocephalic and atraumatic.  Eyes:     General:        Right eye: No discharge.        Left eye: No discharge.     Conjunctiva/sclera: Conjunctivae normal.  Neck:     Thyroid: No thyromegaly.  Cardiovascular:     Rate and Rhythm: Normal rate and regular rhythm.     Heart sounds: Normal heart sounds. No murmur heard. Pulmonary:     Effort: Pulmonary effort is normal. No respiratory distress.     Breath sounds: Normal breath sounds. No wheezing or rales.  Chest:     Chest wall: Tenderness (Left mid to upper ribs) present.  Musculoskeletal:        General: Normal range of motion.     Cervical back: Normal range of motion and neck supple.  Lymphadenopathy:     Cervical: No cervical adenopathy.  Skin:    General: Skin is warm and dry.     Findings: No erythema or rash.  Neurological:     Mental Status: She is alert and oriented to person, place, and time.  Psychiatric:        Behavior: Behavior normal.     Comments: Well groomed, good eye contact, normal speech and thoughts    Results for orders placed or performed in visit on 12/13/22  Nitric oxide  Result Value Ref Range   Nitric Oxide 5       Assessment & Plan:   Problem List Items Addressed This Visit     Anxiety   Low back pain   Relevant Medications   HYDROcodone-acetaminophen (NORCO/VICODIN) 5-325 MG tablet   Other Relevant Orders   DG Lumbar Spine Complete   MDD (major depressive disorder), recurrent episode, moderate (HCC) - Primary   Other Visit Diagnoses     Closed fracture of one rib of left side with routine healing, subsequent encounter       Relevant Medications   HYDROcodone-acetaminophen (NORCO/VICODIN) 5-325 MG tablet   Other Relevant Orders   DG Ribs Unilateral Left   Rib pain on left side       Relevant Orders   DG Ribs Unilateral Left       L Rib fracture previously. Now with recurrent pain, recent fall injury. Could have pain flare from prior coughing  issue as well and chronic back pain.  Order short term 5 day acute supply Hydrocodone medication.  Future orders Chest X-ray +  L Rib Series, walk in if not improving  Topical therapy recommended + Heating pad and ice packs as needed.  Depression recurrent / Anxiety Improved overall Continue current therapy / Psych management  Orders Placed This Encounter  Procedures   DG Ribs Unilateral Left    Standing Status:   Future    Standing Expiration Date:   01/01/2024    Order Specific Question:   Reason for Exam (SYMPTOM  OR DIAGNOSIS REQUIRED)    Answer:   fall injury 7/13 back and left rib pain    Order Specific Question:   Is patient pregnant?    Answer:   No    Order Specific Question:   Preferred imaging location?    Answer:   [facility] Lumbar Spine Complete    Standing Status:   Future    Standing Expiration Date:   01/01/2024    Order Specific Question:   Reason for Exam (SYMPTOM  OR DIAGNOSIS REQUIRED)    Answer:   fall injury 7/13 back and left rib pain    Order Specific Question:   Is patient pregnant?    Answer:   No    Order Specific Question:   Preferred imaging location?    Answer:   [facility]     Meds ordered this encounter  Medications   HYDROcodone-acetaminophen (NORCO/VICODIN) 5-325 MG tablet    Sig: Take 1 tablet by mouth every 4 (four) hours as needed for moderate pain.    Dispense:  30 tablet    Refill:  0     Follow up plan: Return in about 4 months (around 05/04/2023) for 4 month fasting lab only then 1 week later Annual Physical.  Future labs ordered for 04/28/23  Saralyn Pilar, DO [facility] 01/01/2023, 8:54 AM

## 2023-01-06 ENCOUNTER — Inpatient Hospital Stay: Payer: Managed Care, Other (non HMO) | Attending: Oncology

## 2023-01-07 ENCOUNTER — Ambulatory Visit: Payer: Self-pay | Admitting: *Deleted

## 2023-01-07 ENCOUNTER — Ambulatory Visit
Admission: RE | Admit: 2023-01-07 | Discharge: 2023-01-07 | Disposition: A | Discharge status: Home / Self Care | Payer: Managed Care, Other (non HMO) | Attending: Family Medicine | Admitting: Family Medicine

## 2023-01-07 ENCOUNTER — Ambulatory Visit
Admission: RE | Admit: 2023-01-07 | Discharge: 2023-01-07 | Disposition: A | Discharge status: Home / Self Care | Payer: Managed Care, Other (non HMO) | Source: Ambulatory Visit | Attending: Family Medicine | Admitting: Family Medicine

## 2023-01-07 DIAGNOSIS — R0781 Pleurodynia: Secondary | ICD-10-CM | POA: Insufficient documentation

## 2023-01-07 DIAGNOSIS — M545 Low back pain, unspecified: Secondary | ICD-10-CM

## 2023-01-07 DIAGNOSIS — S2232XD Fracture of one rib, left side, subsequent encounter for fracture with routine healing: Secondary | ICD-10-CM

## 2023-01-07 DIAGNOSIS — R0789 Other chest pain: Secondary | ICD-10-CM

## 2023-01-07 NOTE — Telephone Encounter (Signed)
Awaiting results of x-rays that were performed yesterday.  Dr. Althea Charon will be back in the office tomorrow and can consider changing or increasing her pain medication if deemed appropriate.

## 2023-01-07 NOTE — Telephone Encounter (Addendum)
Patient requesting increase in pain medication or alternative medication due to severe pain and she will be visiting her son on Saturday in the [place] and does not want to be in pain. Please advise .    Chief Complaint: severe pain left shoulder, wants xrays  Symptoms: severe pain left shoulder/ rib area. Reports hydrocodone not working. Chest pain / SOB patient reports is due to pain level causing sx .  Frequency: now  and on going since 01/01/23 Pertinent Negatives: Patient denies difficulty breathing no N/V reported. No sweating reported.  Disposition: [] ED /[] Urgent Care (no appt availability in office) / [] Appointment(In office/virtual)/ []  [place] [facility]/ [] Home Care/ [] Refused Recommended Disposition /[] [place] [facility]/ [x]  Follow-up with PCP Additional Notes:  While triaging patient on phone pt request for NT to call her back due to she was at work and needed to use  her cell phone for triage. When called patient back pt reports she spoke to [place], and was told to come in to office for xrays . Patient did not go to ED.     Reason for Disposition  [1] SEVERE pain AND [2] not improved 2 hours after pain medicine  Answer Assessment - Initial Assessment Questions 1. ONSET: "When did the pain start?"     On going has been seen last OV 01/01/23 2. LOCATION: "Where is the pain located?"     Left shoulder / rib area 3. PAIN: "How bad is the pain?" (Scale 1-10; or mild, moderate, severe)   - MILD (1-3): doesn't interfere with normal activities   - MODERATE (4-7): interferes with normal activities (e.g., work or school) or awakens from sleep   - SEVERE (8-10): excruciating pain, unable to do any normal activities, unable to move arm at all due to pain     Reports as severe even with taking hydrocodone 4. WORK OR EXERCISE: "Has there been any recent work or exercise that involved this part of the body?"     na 5. CAUSE: "What do you think is causing the shoulder pain?"      Not sure  6. OTHER SYMPTOMS: "Do you have any other symptoms?" (e.g., neck pain, swelling, rash, fever, numbness, weakness)     Chest discomfort / pain , shortness of breath.  7. PREGNANCY: "Is there any chance you are pregnant?" "When was your last menstrual period?"     na  Protocols used: Shoulder Pain-A-AH

## 2023-01-08 ENCOUNTER — Ambulatory Visit: Payer: Self-pay | Admitting: *Deleted

## 2023-01-08 DIAGNOSIS — R0781 Pleurodynia: Secondary | ICD-10-CM

## 2023-01-08 DIAGNOSIS — S2242XD Multiple fractures of ribs, left side, subsequent encounter for fracture with routine healing: Secondary | ICD-10-CM

## 2023-01-08 MED ORDER — OXYCODONE-ACETAMINOPHEN 5-325 MG PO TABS
1.0000 | ORAL_TABLET | ORAL | 0 refills | Status: AC | PRN
Start: 2023-01-08 — End: 2023-01-13

## 2023-01-08 MED ORDER — OXYCODONE-ACETAMINOPHEN 5-325 MG PO TABS
1.0000 | ORAL_TABLET | ORAL | 0 refills | Status: DC | PRN
Start: 2023-01-08 — End: 2023-01-08

## 2023-01-08 NOTE — Telephone Encounter (Addendum)
  Chief Complaint: Came to office yesterday 7/23 and had x rays done.   She is requesting they be interpreted and also needing pain medication.   Hydrocodone is not helping and she is out of it. Symptoms: Severe pain left side of her chest beside her breast radiating around her left side and into her left shoulder blade.   Having shooting pains from her left shoulder blade. Frequency: Pt cry and stating pain is 15/10 this morning. Pertinent Negatives: Patient denies recent injuries.   The fractured rib is from May when she had a body scan done by her cancer dr.   No recent injuries.   Doesn't know why she is having this pain. Disposition: [] ED /[] Urgent Care (no appt availability in office) / [] Appointment(In office/virtual)/ []  [place] [facility]/ [] Home Care/ [] Refused Recommended Disposition /[] [place] [facility]/ [x]  Follow-up with PCP Additional Notes: High priority message sent to Dr. Althea Charon.     I called into [place] and spoke with [place].   She will get the message to Dr. Althea Charon.

## 2023-01-08 NOTE — Telephone Encounter (Signed)
Message from Ranger T sent at 01/08/2023  8:25 AM EDT  Summary: rib pain/medication needed   Patient called stated she is out of medication and her pain is worse. On a scale of 1-10 her pain is 15. She is requesting more meds or anything provider can do. Please f/u with patient.          Call History  Contact Date/Time Type Contact Phone/Fax User  01/08/2023 08:23 AM EDT Phone (Incoming) Matty, [place] (Self) [phone] Rexene Edison) Elon Jester   Reason for Disposition  [1] Caller has URGENT medicine question about med that PCP or specialist prescribed AND [2] triager unable to answer question    Had x rays done yesterday at the office.   Needs them interpreted and pain medicine.   The hydrocodone is not working.  Answer Assessment - Initial Assessment Questions 1. NAME of MEDICINE: "What medicine(s) are you calling about?"     I'm out of medication.   I'm in so much pain.   Hydrocodone is not working I took my last one this morning.   I even took 2 hydrocodone and it's not helping this pain. I called yesterday and came in for x rays at the office.   I've not heard anything back.   Can Dr. Althea Charon please look at my x rays and prescribe something for me?   Pt is crying and c/o severe pain in left chest beside of her breast radiating around into he left side and into her left shoulder blade.    2. QUESTION: "What is your question?" (e.g., double dose of medicine, side effect)     Can I have something else for pain?    I took my last Hydrocodone this morning.     I have a fractured rib from in May.    The body scan from my cancer dr showed a fractured rib but we don't know when it happened.   I didn't know anything about having a fractured rib.    It's a shooting pain from my left shoulder blade in my back.     I had a bone scan done too.  They also checked me for pneumonia.      Yesterday they did 10 different views at your office.   If I need to go to the ED I will.  I really don't want  to go to the ED.  The wait is so long and I'm in so much pain.   Every day this pain is getting worse.    I'm supposed see my son who is in the [facility] Saturday.   I can't make that trip with this pain.    I just need something for this pain.   I hurt so bad.     If he needs to see me I can come in but I can't take a shower so I might stink but I don't care at this point.    3. PRESCRIBER: "Who prescribed the medicine?" Reason: if prescribed by specialist, call should be referred to that group.     Dr. Althea Charon 4. SYMPTOMS: "Do you have any symptoms?" If Yes, ask: "What symptoms are you having?"  "How bad are the symptoms (e.g., mild, moderate, severe)     Severe pain in left rib area and into her left shoulder blade.   See above 5. PREGNANCY:  "Is there any chance that you are pregnant?" "When was your last menstrual period?"     Not asked due to  age  Protocols used: Medication Question Call-A-AH

## 2023-01-08 NOTE — Telephone Encounter (Signed)
I am back in office today. Reviewing this request. Her X-rays of Lumbar Spine and Left Rib series were completed yesterday 01/07/23 but the Radiologist has not interpreted them yet.  I understand she wants the results. But I cannot solve that. We will let her know once they are available.  If not back by tomorrow, may need to contact Toniann Fail to check status on X-rays results.  Regarding pain medication, she was given short course of Hydrocodone for pain.  I can adjust her pain medicine to a different version instead of Hydrocodone we can trial Oxycodone that may work better for her rib fracture pain.  I will send short term supply and we can contact her with X-ray results later this week.  Saralyn Pilar, DO [facility] 01/08/2023, 9:54 AM

## 2023-01-08 NOTE — Telephone Encounter (Signed)
Patient aware of new RX.  

## 2023-01-08 NOTE — Addendum Note (Signed)
Addended by: Smitty Cords on: 01/08/2023 09:56 AM   Modules accepted: Orders

## 2023-01-09 ENCOUNTER — Encounter: Payer: Self-pay | Admitting: Family Medicine

## 2023-01-09 ENCOUNTER — Telehealth: Payer: Self-pay | Admitting: Family Medicine

## 2023-01-09 DIAGNOSIS — R0781 Pleurodynia: Secondary | ICD-10-CM

## 2023-01-09 MED ORDER — CYCLOBENZAPRINE HCL 10 MG PO TABS
5.0000 mg | ORAL_TABLET | Freq: Three times a day (TID) | ORAL | 1 refills | Status: DC | PRN
Start: 2023-01-09 — End: 2023-09-01

## 2023-01-09 MED ORDER — PREDNISONE 20 MG PO TABS: ORAL_TABLET | ORAL | 0 refills | Status: DC

## 2023-01-09 NOTE — Telephone Encounter (Signed)
See phone note

## 2023-01-09 NOTE — Telephone Encounter (Signed)
Called patient.  I advised that I cannot order higher dose Oxycodone 1 day later, she just filled 30 pills of Oxycodone 5/325 on 01/08/23.  She may double up but will run out of meds sooner, she is traveling out of state this weekend.  I advised we can add Prednisone taper 7 day and Flexeril inc dose 10mg  as needed.  She can take Aleve + current oxycodone as needed.  We are worried about rib fracture or re-injury.  Also I called X-ray radiology dept, they will make sure X-rays are reviewed and read tonight and available tomorrow 7/26  We will need to notify patient of X-ray result when available.  Saralyn Pilar, DO [facility] 01/09/2023, 5:17 PM

## 2023-01-09 NOTE — Telephone Encounter (Signed)
Pt is calling in because she had xrays done earlier this week and hasn't gotten the results as of yet. Pt says the medication she was prescribed isn't helping much and she wants to know if she can get the dose changed to a higher dose or if she can get a muscle relaxer or steroid with the medication because she needs some relief from the pain. Pt says she will be going out of town on Saturday and she wants this figured out by then if possible. oxyCODONE-acetaminophen (PERCOCET/ROXICET) 5-325 MG tablet [[id number]] Pt says he took a 10 from her neighbor and it helped her. So she wants to know if she could get a 10 instead of a 5 in the medication. Please follow up with pt.

## 2023-01-10 ENCOUNTER — Telehealth: Payer: Self-pay

## 2023-01-10 NOTE — Telephone Encounter (Signed)
Left message informing patient of results. °

## 2023-01-10 NOTE — Telephone Encounter (Signed)
-----   Message from Saralyn Pilar sent at 01/10/2023  8:09 AM EDT ----- Please notify patient that her X-ray results are now available in MyChart to review.  Left Rib series X-ray shows no fracture or displaced ribs. Normal image. She most likely has rib contusion that explains her pain.  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 No fracture or other injury.  Saralyn Pilar, DO [facility] 01/10/2023, 8:08 AM

## 2023-01-10 NOTE — Telephone Encounter (Signed)
Left message for patient to call back  

## 2023-01-23 ENCOUNTER — Inpatient Hospital Stay: Payer: Managed Care, Other (non HMO) | Attending: Oncology

## 2023-01-23 ENCOUNTER — Ambulatory Visit: Payer: Self-pay

## 2023-01-23 NOTE — Telephone Encounter (Signed)
  Chief Complaint: Chest pain - left side under rib radiates around to back Symptoms: above Frequency: 1 month  - increasing in frequency Pertinent Negatives: Patient denies  Disposition: [x] ED /[] Urgent Care (no appt availability in office) / [] Appointment(In office/virtual)/ []  [place] [facility]/ [] Home Care/ [] Refused Recommended Disposition /[] [place] [facility]/ []  Follow-up with PCP Additional Notes: Pt state  that pain is left side, and when not in pain she has a numbness. Advised ED. Pt will take a shower and an Advil. She will monitor s/s and go to ed if pain returns.  Reason for Disposition  [1] Chest pain (or "angina") comes and goes AND [2] is happening more often (increasing in frequency) or getting worse (increasing in severity)  (Exception: Chest pains that last only a few seconds.)  Answer Assessment - Initial Assessment Questions 1. LOCATION: "Where does it hurt?"       Left  2. RADIATION: "Does the pain go anywhere else?" (e.g., into neck, jaw, arms, back)     Middle back 3. ONSET: "When did the chest pain begin?" (Minutes, hours or days)      1 month - getting worse 4. PATTERN: "Does the pain come and go, or has it been constant since it started?"  "Does it get worse with exertion?"      Comes and goes 5. DURATION: "How long does it last" (e.g., seconds, minutes, hours)     2 minutes 6. SEVERITY: "How bad is the pain?"  (e.g., Scale 1-10; mild, moderate, or severe)    - MILD (1-3): doesn't interfere with normal activities     - MODERATE (4-7): interferes with normal activities or awakens from sleep    - SEVERE (8-10): excruciating pain, unable to do any normal activities       10/10 7. CARDIAC RISK FACTORS: "Do you have any history of heart problems or risk factors for heart disease?" (e.g., angina, prior heart attack; diabetes, high blood pressure, high cholesterol, smoker, or strong family history of heart disease)     HTN 8. PULMONARY RISK FACTORS: "Do you  have any history of lung disease?"  (e.g., blood clots in lung, asthma, emphysema, birth control pills)     no 9. CAUSE: "What do you think is causing the chest pain?"     unsure 10. OTHER SYMPTOMS: "Do you have any other symptoms?" (e.g., dizziness, nausea, vomiting, sweating, fever, difficulty breathing, cough)       no  Protocols used: Chest Pain-A-AH

## 2023-01-24 ENCOUNTER — Emergency Department: Payer: Managed Care, Other (non HMO)

## 2023-01-24 ENCOUNTER — Other Ambulatory Visit: Payer: Self-pay

## 2023-01-24 ENCOUNTER — Inpatient Hospital Stay
Admission: EM | Admit: 2023-01-24 | Discharge: 2023-02-06 | DRG: 823 | Disposition: A | Discharge status: Home Health | Payer: Managed Care, Other (non HMO) | Attending: Internal Medicine | Admitting: Internal Medicine

## 2023-01-24 DIAGNOSIS — I10 Essential (primary) hypertension: Secondary | ICD-10-CM | POA: Diagnosis present

## 2023-01-24 DIAGNOSIS — Z6836 Body mass index (BMI) 36.0-36.9, adult: Secondary | ICD-10-CM | POA: Diagnosis not present

## 2023-01-24 DIAGNOSIS — Z86010 Personal history of colonic polyps: Secondary | ICD-10-CM

## 2023-01-24 DIAGNOSIS — Z89422 Acquired absence of other left toe(s): Secondary | ICD-10-CM

## 2023-01-24 DIAGNOSIS — E669 Obesity, unspecified: Secondary | ICD-10-CM | POA: Diagnosis present

## 2023-01-24 DIAGNOSIS — R0602 Shortness of breath: Secondary | ICD-10-CM | POA: Diagnosis present

## 2023-01-24 DIAGNOSIS — Z800 Family history of malignant neoplasm of digestive organs: Secondary | ICD-10-CM

## 2023-01-24 DIAGNOSIS — F17210 Nicotine dependence, cigarettes, uncomplicated: Secondary | ICD-10-CM | POA: Diagnosis present

## 2023-01-24 DIAGNOSIS — E8770 Fluid overload, unspecified: Secondary | ICD-10-CM | POA: Diagnosis not present

## 2023-01-24 DIAGNOSIS — R079 Chest pain, unspecified: Secondary | ICD-10-CM | POA: Diagnosis present

## 2023-01-24 DIAGNOSIS — G4700 Insomnia, unspecified: Secondary | ICD-10-CM | POA: Diagnosis present

## 2023-01-24 DIAGNOSIS — Z8616 Personal history of COVID-19: Secondary | ICD-10-CM | POA: Diagnosis not present

## 2023-01-24 DIAGNOSIS — T502X5A Adverse effect of carbonic-anhydrase inhibitors, benzothiadiazides and other diuretics, initial encounter: Secondary | ICD-10-CM | POA: Diagnosis not present

## 2023-01-24 DIAGNOSIS — Z9889 Other specified postprocedural states: Secondary | ICD-10-CM

## 2023-01-24 DIAGNOSIS — F419 Anxiety disorder, unspecified: Secondary | ICD-10-CM | POA: Diagnosis present

## 2023-01-24 DIAGNOSIS — J9601 Acute respiratory failure with hypoxia: Secondary | ICD-10-CM | POA: Diagnosis not present

## 2023-01-24 DIAGNOSIS — Z818 Family history of other mental and behavioral disorders: Secondary | ICD-10-CM

## 2023-01-24 DIAGNOSIS — Z79899 Other long term (current) drug therapy: Secondary | ICD-10-CM

## 2023-01-24 DIAGNOSIS — K2270 Barrett's esophagus without dysplasia: Secondary | ICD-10-CM | POA: Diagnosis present

## 2023-01-24 DIAGNOSIS — Z9884 Bariatric surgery status: Secondary | ICD-10-CM

## 2023-01-24 DIAGNOSIS — G893 Neoplasm related pain (acute) (chronic): Secondary | ICD-10-CM

## 2023-01-24 DIAGNOSIS — Z79890 Hormone replacement therapy (postmenopausal): Secondary | ICD-10-CM

## 2023-01-24 DIAGNOSIS — E890 Postprocedural hypothyroidism: Secondary | ICD-10-CM | POA: Diagnosis present

## 2023-01-24 DIAGNOSIS — Z862 Personal history of diseases of the blood and blood-forming organs and certain disorders involving the immune mechanism: Secondary | ICD-10-CM

## 2023-01-24 DIAGNOSIS — N179 Acute kidney failure, unspecified: Secondary | ICD-10-CM | POA: Diagnosis not present

## 2023-01-24 DIAGNOSIS — R0609 Other forms of dyspnea: Secondary | ICD-10-CM | POA: Diagnosis not present

## 2023-01-24 DIAGNOSIS — C9000 Multiple myeloma not having achieved remission: Secondary | ICD-10-CM | POA: Diagnosis present

## 2023-01-24 DIAGNOSIS — Z9189 Other specified personal risk factors, not elsewhere classified: Secondary | ICD-10-CM

## 2023-01-24 DIAGNOSIS — K219 Gastro-esophageal reflux disease without esophagitis: Secondary | ICD-10-CM | POA: Diagnosis present

## 2023-01-24 DIAGNOSIS — D472 Monoclonal gammopathy: Secondary | ICD-10-CM | POA: Diagnosis present

## 2023-01-24 DIAGNOSIS — G9589 Other specified diseases of spinal cord: Secondary | ICD-10-CM | POA: Diagnosis not present

## 2023-01-24 DIAGNOSIS — J40 Bronchitis, not specified as acute or chronic: Secondary | ICD-10-CM

## 2023-01-24 DIAGNOSIS — D62 Acute posthemorrhagic anemia: Secondary | ICD-10-CM

## 2023-01-24 DIAGNOSIS — E039 Hypothyroidism, unspecified: Secondary | ICD-10-CM | POA: Diagnosis present

## 2023-01-24 DIAGNOSIS — M1990 Unspecified osteoarthritis, unspecified site: Secondary | ICD-10-CM | POA: Diagnosis present

## 2023-01-24 DIAGNOSIS — K5903 Drug induced constipation: Secondary | ICD-10-CM

## 2023-01-24 DIAGNOSIS — J811 Chronic pulmonary edema: Secondary | ICD-10-CM | POA: Diagnosis not present

## 2023-01-24 DIAGNOSIS — R768 Other specified abnormal immunological findings in serum: Secondary | ICD-10-CM | POA: Diagnosis present

## 2023-01-24 DIAGNOSIS — R410 Disorientation, unspecified: Secondary | ICD-10-CM | POA: Diagnosis not present

## 2023-01-24 DIAGNOSIS — Z825 Family history of asthma and other chronic lower respiratory diseases: Secondary | ICD-10-CM

## 2023-01-24 DIAGNOSIS — Z515 Encounter for palliative care: Secondary | ICD-10-CM

## 2023-01-24 DIAGNOSIS — D434 Neoplasm of uncertain behavior of spinal cord: Secondary | ICD-10-CM | POA: Diagnosis not present

## 2023-01-24 DIAGNOSIS — M5414 Radiculopathy, thoracic region: Secondary | ICD-10-CM | POA: Diagnosis present

## 2023-01-24 DIAGNOSIS — E8779 Other fluid overload: Secondary | ICD-10-CM | POA: Diagnosis not present

## 2023-01-24 DIAGNOSIS — Z01812 Encounter for preprocedural laboratory examination: Secondary | ICD-10-CM

## 2023-01-24 DIAGNOSIS — M532X4 Spinal instabilities, thoracic region: Secondary | ICD-10-CM | POA: Diagnosis present

## 2023-01-24 DIAGNOSIS — G9341 Metabolic encephalopathy: Secondary | ICD-10-CM | POA: Diagnosis not present

## 2023-01-24 DIAGNOSIS — R197 Diarrhea, unspecified: Secondary | ICD-10-CM | POA: Diagnosis not present

## 2023-01-24 DIAGNOSIS — J45909 Unspecified asthma, uncomplicated: Secondary | ICD-10-CM | POA: Diagnosis present

## 2023-01-24 DIAGNOSIS — Z833 Family history of diabetes mellitus: Secondary | ICD-10-CM

## 2023-01-24 DIAGNOSIS — M4804 Spinal stenosis, thoracic region: Secondary | ICD-10-CM | POA: Insufficient documentation

## 2023-01-24 DIAGNOSIS — F17200 Nicotine dependence, unspecified, uncomplicated: Secondary | ICD-10-CM | POA: Diagnosis present

## 2023-01-24 DIAGNOSIS — Z813 Family history of other psychoactive substance abuse and dependence: Secondary | ICD-10-CM

## 2023-01-24 DIAGNOSIS — Z6710 Type A blood, Rh positive: Secondary | ICD-10-CM

## 2023-01-24 DIAGNOSIS — E785 Hyperlipidemia, unspecified: Secondary | ICD-10-CM | POA: Diagnosis present

## 2023-01-24 DIAGNOSIS — M4854XA Collapsed vertebra, not elsewhere classified, thoracic region, initial encounter for fracture: Secondary | ICD-10-CM | POA: Diagnosis present

## 2023-01-24 DIAGNOSIS — E871 Hypo-osmolality and hyponatremia: Secondary | ICD-10-CM

## 2023-01-24 DIAGNOSIS — Z8249 Family history of ischemic heart disease and other diseases of the circulatory system: Secondary | ICD-10-CM

## 2023-01-24 DIAGNOSIS — Z808 Family history of malignant neoplasm of other organs or systems: Secondary | ICD-10-CM

## 2023-01-24 DIAGNOSIS — F32A Depression, unspecified: Secondary | ICD-10-CM | POA: Diagnosis present

## 2023-01-24 DIAGNOSIS — T402X5A Adverse effect of other opioids, initial encounter: Secondary | ICD-10-CM | POA: Diagnosis not present

## 2023-01-24 DIAGNOSIS — Z8269 Family history of other diseases of the musculoskeletal system and connective tissue: Secondary | ICD-10-CM

## 2023-01-24 DIAGNOSIS — G4733 Obstructive sleep apnea (adult) (pediatric): Secondary | ICD-10-CM | POA: Diagnosis present

## 2023-01-24 DIAGNOSIS — F411 Generalized anxiety disorder: Secondary | ICD-10-CM | POA: Diagnosis present

## 2023-01-24 DIAGNOSIS — Z8261 Family history of arthritis: Secondary | ICD-10-CM

## 2023-01-24 DIAGNOSIS — Z8701 Personal history of pneumonia (recurrent): Secondary | ICD-10-CM

## 2023-01-24 DIAGNOSIS — Z811 Family history of alcohol abuse and dependence: Secondary | ICD-10-CM

## 2023-01-24 LAB — BASIC METABOLIC PANEL
Anion gap: 13 (ref 5–15)
BUN: 12 mg/dL (ref 6–20)
CO2: 23 mmol/L (ref 22–32)
Calcium: 11.1 mg/dL — ABNORMAL HIGH (ref 8.9–10.3)
Chloride: 93 mmol/L — ABNORMAL LOW (ref 98–111)
Creatinine, Ser: 0.79 mg/dL (ref 0.44–1.00)
GFR, Estimated: >60 mL/min (ref >60)
Glucose, Bld: 100 mg/dL — ABNORMAL HIGH (ref 70–99)
Potassium: 4 mmol/L (ref 3.5–5.1)
Sodium: 129 mmol/L — ABNORMAL LOW (ref 135–145)

## 2023-01-24 LAB — CBC
HCT: 37 % (ref 36.0–46.0)
Hemoglobin: 12.6 g/dL (ref 12.0–15.0)
MCH: 32.3 pg (ref 26.0–34.0)
MCHC: 34.1 g/dL (ref 30.0–36.0)
MCV: 94.9 fL (ref 80.0–100.0)
Platelets: 299 10*3/uL (ref 150–400)
RBC: 3.9 MIL/uL (ref 3.87–5.11)
RDW: 13.6 % (ref 11.5–15.5)
WBC: 8.1 10*3/uL (ref 4.0–10.5)
nRBC: 0 % (ref 0.0–0.2)

## 2023-01-24 LAB — TROPONIN I (HIGH SENSITIVITY)
Troponin I (High Sensitivity): <2 ng/L (ref <18)
Troponin I (High Sensitivity): 3 ng/L (ref <18)

## 2023-01-24 LAB — LACTATE DEHYDROGENASE: LDH: 67 U/L — ABNORMAL LOW (ref 98–192)

## 2023-01-24 MED ORDER — ONDANSETRON HCL 4 MG/2ML IJ SOLN
4.0000 mg | Freq: Once | INTRAMUSCULAR | Status: AC
  Administered 2023-01-24: 4 mg via INTRAVENOUS
  Filled 2023-01-24: qty 2

## 2023-01-24 MED ORDER — VITAMIN D (ERGOCALCIFEROL) 1.25 MG (50000 UNIT) PO CAPS
50000.0000 [IU] | ORAL_CAPSULE | ORAL | Status: DC
  Administered 2023-01-25 – 2023-02-01 (×2): 50000 [IU] via ORAL
  Filled 2023-01-24 (×2): qty 1

## 2023-01-24 MED ORDER — SODIUM CHLORIDE 0.9 % IV SOLN: INTRAVENOUS | Status: DC

## 2023-01-24 MED ORDER — IPRATROPIUM-ALBUTEROL 0.5-2.5 (3) MG/3ML IN SOLN: 3.0000 mL | Freq: Four times a day (QID) | RESPIRATORY_TRACT | Status: AC | PRN

## 2023-01-24 MED ORDER — BUDESON-GLYCOPYRROL-FORMOTEROL 160-9-4.8 MCG/ACT IN AERO: 2.0000 | INHALATION_SPRAY | RESPIRATORY_TRACT | Status: DC

## 2023-01-24 MED ORDER — HEPARIN SODIUM (PORCINE) 5000 UNIT/ML IJ SOLN
5000.0000 [IU] | Freq: Three times a day (TID) | INTRAMUSCULAR | Status: DC
  Administered 2023-01-24 – 2023-01-27 (×9): 5000 [IU] via SUBCUTANEOUS
  Filled 2023-01-24 (×9): qty 1

## 2023-01-24 MED ORDER — IPRATROPIUM-ALBUTEROL 0.5-2.5 (3) MG/3ML IN SOLN
3.0000 mL | Freq: Once | RESPIRATORY_TRACT | Status: AC
  Administered 2023-01-24: 3 mL via RESPIRATORY_TRACT
  Filled 2023-01-24: qty 3

## 2023-01-24 MED ORDER — CLONAZEPAM 0.5 MG PO TABS
1.0000 mg | ORAL_TABLET | Freq: Two times a day (BID) | ORAL | Status: DC
  Administered 2023-01-24 – 2023-02-03 (×19): 1 mg via ORAL
  Filled 2023-01-24 (×19): qty 2

## 2023-01-24 MED ORDER — ONDANSETRON HCL 4 MG PO TABS: 4.0000 mg | ORAL_TABLET | Freq: Four times a day (QID) | ORAL | Status: AC | PRN

## 2023-01-24 MED ORDER — HYDROMORPHONE HCL 1 MG/ML IJ SOLN
0.5000 mg | INTRAMUSCULAR | Status: DC | PRN
  Administered 2023-01-24: 0.5 mg via INTRAVENOUS
  Filled 2023-01-24: qty 0.5

## 2023-01-24 MED ORDER — SENNOSIDES-DOCUSATE SODIUM 8.6-50 MG PO TABS
1.0000 | ORAL_TABLET | Freq: Two times a day (BID) | ORAL | Status: DC
  Administered 2023-01-24 – 2023-01-27 (×7): 1 via ORAL
  Filled 2023-01-24 (×7): qty 1

## 2023-01-24 MED ORDER — ACETAMINOPHEN 325 MG PO TABS
650.0000 mg | ORAL_TABLET | Freq: Four times a day (QID) | ORAL | Status: DC | PRN
  Administered 2023-01-25 – 2023-01-27 (×7): 650 mg via ORAL
  Filled 2023-01-24 (×7): qty 2

## 2023-01-24 MED ORDER — CYCLOBENZAPRINE HCL 5 MG PO TABS
7.5000 mg | ORAL_TABLET | Freq: Three times a day (TID) | ORAL | Status: DC | PRN
  Administered 2023-01-25: 7.5 mg via ORAL
  Filled 2023-01-24 (×2): qty 1.5

## 2023-01-24 MED ORDER — MORPHINE SULFATE (PF) 4 MG/ML IV SOLN
4.0000 mg | INTRAVENOUS | Status: DC | PRN
  Administered 2023-01-24 – 2023-01-25 (×2): 4 mg via INTRAVENOUS
  Filled 2023-01-24 (×2): qty 1

## 2023-01-24 MED ORDER — ONDANSETRON HCL 4 MG/2ML IJ SOLN: 4.0000 mg | Freq: Four times a day (QID) | INTRAMUSCULAR | Status: AC | PRN

## 2023-01-24 MED ORDER — GADOBUTROL 1 MMOL/ML IV SOLN
7.0000 mL | Freq: Once | INTRAVENOUS | Status: AC | PRN
  Administered 2023-01-24: 7 mL via INTRAVENOUS

## 2023-01-24 MED ORDER — NICOTINE 21 MG/24HR TD PT24
21.0000 mg | MEDICATED_PATCH | Freq: Every day | TRANSDERMAL | Status: DC | PRN
  Administered 2023-01-25 – 2023-01-26 (×2): 21 mg via TRANSDERMAL
  Filled 2023-01-24 (×2): qty 1

## 2023-01-24 MED ORDER — IOHEXOL 350 MG/ML SOLN
75.0000 mL | Freq: Once | INTRAVENOUS | Status: AC | PRN
  Administered 2023-01-24: 75 mL via INTRAVENOUS

## 2023-01-24 MED ORDER — ESCITALOPRAM OXALATE 10 MG PO TABS
10.0000 mg | ORAL_TABLET | Freq: Every day | ORAL | Status: DC
  Administered 2023-01-24 – 2023-02-06 (×13): 10 mg via ORAL
  Filled 2023-01-24 (×13): qty 1

## 2023-01-24 MED ORDER — UMECLIDINIUM BROMIDE 62.5 MCG/ACT IN AEPB
1.0000 | INHALATION_SPRAY | Freq: Every day | RESPIRATORY_TRACT | Status: DC
  Administered 2023-01-24 – 2023-02-06 (×13): 1 via RESPIRATORY_TRACT
  Filled 2023-01-24 (×2): qty 7

## 2023-01-24 MED ORDER — MORPHINE SULFATE (PF) 4 MG/ML IV SOLN
6.0000 mg | Freq: Once | INTRAVENOUS | Status: AC
  Administered 2023-01-24: 6 mg via INTRAVENOUS
  Filled 2023-01-24: qty 2

## 2023-01-24 MED ORDER — LEVOTHYROXINE SODIUM 50 MCG PO TABS
250.0000 ug | ORAL_TABLET | Freq: Every day | ORAL | Status: DC
  Administered 2023-01-25 – 2023-02-06 (×12): 250 ug via ORAL
  Filled 2023-01-24 (×12): qty 1

## 2023-01-24 MED ORDER — SENNOSIDES-DOCUSATE SODIUM 8.6-50 MG PO TABS
1.0000 | ORAL_TABLET | Freq: Every evening | ORAL | Status: DC | PRN
  Filled 2023-01-24: qty 1

## 2023-01-24 MED ORDER — MOMETASONE FURO-FORMOTEROL FUM 200-5 MCG/ACT IN AERO
2.0000 | INHALATION_SPRAY | Freq: Two times a day (BID) | RESPIRATORY_TRACT | Status: DC
  Administered 2023-01-24 – 2023-02-06 (×25): 2 via RESPIRATORY_TRACT
  Filled 2023-01-24: qty 8.8

## 2023-01-24 MED ORDER — DEXAMETHASONE SODIUM PHOSPHATE 4 MG/ML IJ SOLN
8.0000 mg | Freq: Two times a day (BID) | INTRAMUSCULAR | Status: DC
  Administered 2023-01-24 – 2023-01-27 (×7): 8 mg via INTRAVENOUS
  Filled 2023-01-24 (×7): qty 2

## 2023-01-24 MED ORDER — AMLODIPINE BESYLATE 10 MG PO TABS
10.0000 mg | ORAL_TABLET | Freq: Every day | ORAL | Status: DC
  Administered 2023-01-24 – 2023-01-30 (×6): 10 mg via ORAL
  Filled 2023-01-24: qty 2
  Filled 2023-01-24 (×2): qty 1
  Filled 2023-01-24 (×2): qty 2
  Filled 2023-01-24: qty 1
  Filled 2023-01-24: qty 2

## 2023-01-24 MED ORDER — KETOROLAC TROMETHAMINE 30 MG/ML IJ SOLN: 15.0000 mg | Freq: Once | INTRAMUSCULAR | Status: DC

## 2023-01-24 MED ORDER — BENZONATATE 100 MG PO CAPS: 100.0000 mg | ORAL_CAPSULE | Freq: Three times a day (TID) | ORAL | Status: AC | PRN

## 2023-01-24 MED ORDER — POLYETHYLENE GLYCOL 3350 17 G PO PACK
17.0000 g | PACK | Freq: Two times a day (BID) | ORAL | Status: AC | PRN
  Administered 2023-01-24: 17 g via ORAL
  Filled 2023-01-24: qty 1

## 2023-01-24 MED ORDER — ACETAMINOPHEN 650 MG RE SUPP: 650.0000 mg | Freq: Four times a day (QID) | RECTAL | Status: DC | PRN

## 2023-01-24 MED ORDER — IRBESARTAN 150 MG PO TABS
300.0000 mg | ORAL_TABLET | Freq: Every day | ORAL | Status: DC
  Administered 2023-01-25 – 2023-01-27 (×3): 300 mg via ORAL
  Filled 2023-01-24 (×3): qty 2

## 2023-01-24 MED ORDER — KETOROLAC TROMETHAMINE 30 MG/ML IJ SOLN
15.0000 mg | Freq: Once | INTRAMUSCULAR | Status: AC
  Administered 2023-01-24: 15 mg via INTRAVENOUS
  Filled 2023-01-24: qty 1

## 2023-01-24 MED ORDER — PANTOPRAZOLE SODIUM 40 MG PO TBEC
40.0000 mg | DELAYED_RELEASE_TABLET | Freq: Two times a day (BID) | ORAL | Status: DC
  Administered 2023-01-24 – 2023-02-06 (×25): 40 mg via ORAL
  Filled 2023-01-24 (×25): qty 1

## 2023-01-24 MED ORDER — MORPHINE SULFATE (PF) 2 MG/ML IV SOLN: 2.0000 mg | INTRAVENOUS | Status: DC | PRN

## 2023-01-24 MED ORDER — ZOLPIDEM TARTRATE 5 MG PO TABS
5.0000 mg | ORAL_TABLET | Freq: Every evening | ORAL | Status: DC | PRN
  Administered 2023-01-24 – 2023-02-05 (×8): 5 mg via ORAL
  Filled 2023-01-24 (×9): qty 1

## 2023-01-24 NOTE — Assessment & Plan Note (Addendum)
Neurosurgical procedure done on 8/13 by Dr. Katrinka Blazing.  Patient had a posterior segmental instrumentation T4-T9, posterior lateral arthrodesis from T4-T9, thoracic laminectomy at T6 and T7.  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  Oncology increased oxycodone to 20 mg twice daily.  Placed back on low-dose gabapentin.

## 2023-01-24 NOTE — ED Notes (Signed)
Patient reports pain with breathing at this time. 8/10

## 2023-01-24 NOTE — Assessment & Plan Note (Addendum)
zolpidem 5 mg nightly as needed for sleep resumed

## 2023-01-24 NOTE — ED Notes (Signed)
Patient urinated while at ct scan. This RN unable to collect sample at this time

## 2023-01-24 NOTE — ED Triage Notes (Signed)
Pt to ED for chest pain and shob for a month. Reports had chest xray a couple weeks ago and had bruised ribs from coughing. Pt in NAD, RR even and unlabored. Ambulatory to triage.

## 2023-01-24 NOTE — Assessment & Plan Note (Signed)
Pantoprazole 40 mg p.o. twice daily before meals ordered

## 2023-01-24 NOTE — Assessment & Plan Note (Addendum)
nebulizer standing dose.

## 2023-01-24 NOTE — Consult Note (Signed)
Hematology/Oncology Consult note [facility] [phone] [phone]  Patient Care Team: Smitty Cords, DO as PCP - General (Family Medicine) Lemar Livings, Merrily Pew, MD (General Surgery) Shelia Media, MD (Internal Medicine) Creig Hines, MD as Consulting Physician (Oncology)   Name of the patient: Paula Garrett  [id number]  Feb 17, 1966    Reason for referral- ***   Referring physician- ***  Date of visit: @TODAY @   History of presenting illness- ***  ECOG PS- ***  Pain scale- ***   Review of systems- ROS  Allergies  Allergen Reactions   Hctz [Hydrochlorothiazide]     Hyponatremia     Patient Active Problem List   Diagnosis Date Noted   Shortness of breath 01/24/2023   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   At risk for constipation 01/24/2023   MDD (major depressive disorder), recurrent episode, moderate (HCC) 11/20/2022   SOB (shortness of breath) 11/01/2022   Chest pain 10/07/2022   Anxiety 10/07/2022   Iron deficiency anemia 12/14/2021   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   Goals of care, counseling/discussion 07/24/2020   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   Hypercalcemia 05/05/2020   Traumatic seroma of lower leg, right, subsequent encounter 05/05/2020   Traumatic hematoma of lower leg, right, subsequent encounter 02/09/2020   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   Colon polyps 07/26/2019   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   Prediabetes 09/10/2018   OSA (obstructive sleep apnea) 01/09/2018   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   Allergic rhinitis 11/20/2017   Hyperlipidemia 11/20/2017   Hot flashes 11/20/2017   Asthma 07/26/2017   B12 deficiency 07/26/2017   Snoring 05/01/2016   Rheumatoid factor positive 10/24/2015   Annual physical exam 01/12/2015   Chronic fatigue 06/23/2014   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   Barrett's esophagus without dysplasia 05/27/2014   History of adenomatous  polyp of colon 05/27/2014   Anemia 01/28/2014   Morbid obesity (HCC) 01/28/2014   Low back pain 07/21/2013   Benign essential hypertension 01/20/2013   Vitamin D deficiency 12/02/2012   Chronic left shoulder pain 12/02/2012   Tobacco use disorder 06/09/2012   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   Insomnia 04/21/2012   Hypothyroidism 04/21/2012   Obesity (BMI 30-39.9) 04/21/2012   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     Past Medical History:  Diagnosis Date   Anxiety    Arthritis    Asthma    Barrett's esophagus 2015   COVID-19    03/10/20   Depression    GERD (gastroesophageal reflux disease)    Hypertension    Hypothyroidism    Pneumonia    Smoldering myeloma    Thyroid disease      Past Surgical History:  Procedure Laterality Date   ABLATION  2006   BREAST CYST ASPIRATION Left 1998   BREAST SURGERY     cyst aspiration   BREAST SURGERY  1998   cyst aspiration   COLONOSCOPY  03/2014   COLONOSCOPY WITH PROPOFOL N/A 01/06/2020   Procedure: COLONOSCOPY WITH PROPOFOL;  Surgeon: Toledo, Boykin Nearing, MD;  Location: [facility] ENDOSCOPY;  Service: Gastroenterology;  Laterality: N/A;   ENDOMETRIAL ABLATION     ESOPHAGOGASTRODUODENOSCOPY (EGD) WITH PROPOFOL N/A 08/15/2015   Procedure: ESOPHAGOGASTRODUODENOSCOPY (EGD) WITH PROPOFOL;  Surgeon: Christena Deem, MD;  Location[facility] ENDOSCOPY;  Service: Endoscopy;  Laterality: N/A;   ESOPHAGOGASTRODUODENOSCOPY (EGD) WITH PROPOFOL N/A 01/22/2016   Procedure: ESOPHAGOGASTRODUODENOSCOPY (EGD) WITH PROPOFOL;  Surgeon: Christena Deem, MD;  Location: [facility] ENDOSCOPY;  Service: Endoscopy;  Laterality: N/A;  ESOPHAGOGASTRODUODENOSCOPY (EGD) WITH PROPOFOL N/A 01/06/2020   Procedure: ESOPHAGOGASTRODUODENOSCOPY (EGD) WITH PROPOFOL;  Surgeon: Toledo, Boykin Nearing, MD;  Location: [facility] ENDOSCOPY;  Service: Gastroenterology;  Laterality: N/A;   GASTRIC BYPASS  2005   HERNIA REPAIR     NASAL SINUS SURGERY     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    TOTAL THYROIDECTOMY     TUBAL LIGATION      Social History   Socioeconomic History   Marital status: Divorced    Spouse name: Not on file   Number of children: 2   Years of education: Not on file   Highest education level: GED or equivalent  Occupational History   Occupation: Part-time  Tobacco Use   Smoking status: Every Day    Current packs/day: 1.00    Average packs/day: 1 pack/day for 42.9 years (42.9 ttl pk-yrs)    Types: Cigarettes    Start date: 03/15/1980   Smokeless tobacco: Never  Vaping Use   Vaping status: Never Used  Substance and Sexual Activity   Alcohol use: Yes    Alcohol/week: 10.0 - 12.0 standard drinks of alcohol    Types: 8 - 10 Glasses of wine, 2 Standard drinks or equivalent per week   Drug use: No   Sexual activity: Yes    Partners: Male  Other Topics Concern   Not on file  Social History Narrative   Lives in [place] single, divorced       Work - 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       Diet - healthy diet   Exercise - limited   Caffeine use: daily   Social Determinants of Health   Financial Resource Strain: Low Risk  (11/01/2022)   Overall Financial Resource Strain (CARDIA)    Difficulty of Paying Living Expenses: Not very hard  Food Insecurity: No Food Insecurity (01/24/2023)   Hunger Vital Sign    Worried About Running Out of Food in the Last Year: Never true    Ran Out of Food in the Last Year: Never true  Transportation Needs: No Transportation Needs (01/24/2023)   PRAPARE - Administrator, Civil Service (Medical): No    Lack of Transportation (Non-Medical): No  Physical Activity: Insufficiently Active (11/01/2022)   Exercise Vital Sign    Days of Exercise per Week: 3 days    Minutes of Exercise per Session: 20 min  Stress: Stress Concern Present (11/01/2022)   [employer] of Occupational Health - Occupational Stress Questionnaire    Feeling of Stress : Very much  Social Connections: Socially Isolated (11/01/2022)    Social Connection and Isolation Panel [NHANES]    Frequency of Communication with Friends and Family: More than three times a week    Frequency of Social Gatherings with Friends and Family: Once a week    Attends Religious Services: Never    Database administrator or Organizations: No    Attends Engineer, structural: Not on file    Marital Status: Divorced  Intimate Partner Violence: Not At Risk (01/24/2023)   Humiliation, Afraid, Rape, and Kick questionnaire    Fear of Current or Ex-Partner: No    Emotionally Abused: No    Physically Abused: No    Sexually Abused: No     Family History  Problem Relation Age of Onset   Heart disease Mother    COPD Mother    Arthritis Mother    Cancer Mother  uterine   Lupus Mother    Anxiety disorder Mother    Depression Mother    Drug abuse Mother    COPD Father    Arthritis Father    Heart disease Father    Heart attack Father    Alcohol abuse Father    Heart disease Brother    Heart attack Brother    Drug abuse Brother    Anxiety disorder Brother    Depression Brother    Heart disease Brother    Cancer Brother        liver cancer age 20 y.o   Diabetes Maternal Grandmother    Diabetes Maternal Grandfather    Diabetes Paternal Grandmother    Diabetes Paternal Grandfather    Breast cancer Neg Hx      Current Facility-Administered Medications:    0.9 %  sodium chloride infusion, , Intravenous, Continuous, Cox, Amy N, DO, Last Rate: 75 mL/hr at 01/24/23 1434, New Bag at 01/24/23 1434   acetaminophen (TYLENOL) tablet 650 mg, 650 mg, Oral, Q6H PRN **OR** acetaminophen (TYLENOL) suppository 650 mg, 650 mg, Rectal, Q6H PRN, Cox, Amy N, DO   amLODipine (NORVASC) tablet 10 mg, 10 mg, Oral, Daily, Cox, Amy N, DO, 10 mg at 01/24/23 1551   benzonatate (TESSALON) capsule 100 mg, 100 mg, Oral, TID PRN, Cox, Amy N, DO   clonazePAM (KLONOPIN) tablet 1 mg, 1 mg, Oral, BID, Cox, Amy N, DO   cyclobenzaprine (FLEXERIL) tablet 7.5 mg,  7.5 mg, Oral, TID PRN, Cox, Amy N, DO   dexamethasone (DECADRON) injection 8 mg, 8 mg, Intravenous, Q12H, Creig Hines, MD   escitalopram (LEXAPRO) tablet 10 mg, 10 mg, Oral, Daily, Cox, Amy N, DO, 10 mg at 01/24/23 1551   heparin injection 5,000 Units, 5,000 Units, Subcutaneous, Q8H, Cox, Amy N, DO   ipratropium-albuterol (DUONEB) 0.5-2.5 (3) MG/3ML nebulizer solution 3 mL, 3 mL, Nebulization, Q6H PRN, Cox, Amy N, DO   [START ON 01/25/2023] irbesartan (AVAPRO) tablet 300 mg, 300 mg, Oral, Daily, Cox, Amy N, DO   [START ON 01/25/2023] levothyroxine (SYNTHROID) tablet 250 mcg, 250 mcg, Oral, QAC breakfast, Cox, Amy N, DO   mometasone-formoterol (DULERA) 200-5 MCG/ACT inhaler 2 puff, 2 puff, Inhalation, BID, Coulter, Carolyn, RPH   morphine (PF) 2 MG/ML injection 2 mg, 2 mg, Intravenous, Q4H PRN, Cox, Amy N, DO   morphine (PF) 4 MG/ML injection 4 mg, 4 mg, Intravenous, Q4H PRN, Cox, Amy N, DO   nicotine (NICODERM CQ - dosed in mg/24 hours) patch 21 mg, 21 mg, Transdermal, Daily PRN, Cox, Amy N, DO   ondansetron (ZOFRAN) tablet 4 mg, 4 mg, Oral, Q6H PRN **OR** ondansetron (ZOFRAN) injection 4 mg, 4 mg, Intravenous, Q6H PRN, Cox, Amy N, DO   pantoprazole (PROTONIX) EC tablet 40 mg, 40 mg, Oral, BID AC, Cox, Amy N, DO   polyethylene glycol (MIRALAX / GLYCOLAX) packet 17 g, 17 g, Oral, BID PRN, Cox, Amy N, DO   senna-docusate (Senokot-S) tablet 1 tablet, 1 tablet, Oral, QHS PRN, Cox, Amy N, DO   umeclidinium bromide (INCRUSE ELLIPTA) 62.5 MCG/ACT 1 puff, 1 puff, Inhalation, Daily, Coulter, [place], RPH   [START ON 01/25/2023] Vitamin D (Ergocalciferol) (DRISDOL) 1.25 MG (50000 UNIT) capsule 50,000 Units, 50,000 Units, Oral, Q7 days, Cox, Amy N, DO   zolpidem (AMBIEN) tablet 5 mg, 5 mg, Oral, QHS PRN, Cox, Amy N, DO   Physical exam:  Vitals:   01/24/23 1330 01/24/23 1400 01/24/23 1430 01/24/23 1513  BP: (!) 133/103 Marland Kitchen)  132/92 (!) 108/95 (!) 148/83  Pulse: 76 71 72 76  Resp: 13 11 19 16   Temp:     98.2 F (36.8 C)  TempSrc:    Oral  SpO2: 94% 93% 96% 95%  Weight:      Height:       Physical Exam        Latest Ref Rng & Units 01/24/2023    8:45 AM  CMP  Glucose 70 - 99 mg/dL 811   BUN 6 - 20 mg/dL 12   Creatinine 9.14 - 1.00 mg/dL 7.82   Sodium 956 - 213 mmol/L 129   Potassium 3.5 - 5.1 mmol/L 4.0   Chloride 98 - 111 mmol/L 93   CO2 22 - 32 mmol/L 23   Calcium 8.9 - 10.3 mg/dL 08.6       Latest Ref Rng & Units 01/24/2023    8:45 AM  CBC  WBC 4.0 - 10.5 K/uL 8.1   Hemoglobin 12.0 - 15.0 g/dL 57.8   Hematocrit 46.9 - 46.0 % 37.0   Platelets 150 - 400 K/uL 299     @IMAGES @  MR THORACIC SPINE W WO CONTRAST  Result Date: 01/24/2023 CLINICAL DATA:  Soft tissue mass with bony destruction involving the left seventh rib and T6 and T7 vertebral bodies. EXAM: MRI CERVICAL AND THORACIC SPINE WITHOUT AND WITH CONTRAST TECHNIQUE: Multiplanar and multiecho pulse sequences of the cervical spine, to include the craniocervical junction and cervicothoracic junction, and the thoracic spine, were obtained without and with intravenous contrast. CONTRAST:  7mL GADAVIST GADOBUTROL 1 MMOL/ML IV SOLN COMPARISON:  Same day CTA chest, cervical and thoracic spine MRI 07/20/2020 FINDINGS: MRI CERVICAL SPINE FINDINGS Alignment: There is trace retrolisthesis of C3 on C4 and trace anterolisthesis of C5 on C6. Vertebrae: Vertebral body heights are preserved. Background marrow signal is normal. A T1 hypointense/T2 hyperintense lesion in the clivus is unchanged since 2022, favored benign. There is no suspicious marrow signal abnormality or marrow edema in the cervical spine. There is no abnormal marrow enhancement in the cervical spine. There is a 6 mm enhancing lesion in the right occipital calvarium which was not seen on the prior cervical spine MRI from 2022; however, this area may not has been included within the field of view. Cord: Normal in signal and morphology. Posterior Fossa, vertebral arteries,  paraspinal tissues: The imaged posterior fossa is unremarkable. The vertebral artery flow voids are normal. The paraspinal soft tissues are unremarkable. Disc levels: There is overall mild degenerative change in the cervical spine without significant spinal canal or neural foraminal stenosis. MRI THORACIC SPINE FINDINGS Alignment:  Normal. Vertebrae: Background marrow signal is normal. There is infiltrative T1 hypointensity occupying most of the T6 and T7 vertebral bodies extending into the left pedicles and facets. There is a bulky soft tissue mass in the adjacent left paraspinal/subpleural soft tissues with destruction of the seventh rib. The bulk of the soft tissue component measures up to proximally 7.2 cm x 3.4 cm x 2.7 cm. 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 There is mild loss of vertebral body height at both levels consistent with associated pathologic fractures. There is compression deformity of the T11 vertebral body with up to approximately 30% loss of vertebral body height anteriorly and  minimal bony retropulsion. There is faint edema along the superior endplate consistent with acute to subacute chronicity. This fracture may be pathologic. There are probable additional lesions involving multiple additional ribs (for example 25-21, 25-24). The above findings are new since the MRI from 2022. Cord:  There is no cord signal abnormality or abnormal enhancement. Paraspinal and other soft tissues: Unremarkable, aside from the bulky paraspinal tumor described above. Disc levels: There is a small disc protrusion at T10-T11 without significant spinal canal or neural foraminal stenosis. Otherwise, there is overall minimal background degenerative change without significant  spinal canal or neural foraminal stenosis. IMPRESSION: 1. Large soft tissue mass in the left paraspinal soft tissues at T6-T7 with destruction of the seventh rib and invasion of the T6 and T7 vertebral bodies with involvement of the left posterior elements as well as epidural tumor in the spinal canal resulting in moderate spinal canal stenosis without frank cord compression or cord edema. 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 2. Acute to subacute compression fracture of the T11 vertebral body with up to approximately 30% loss of vertebral body height and minimal bony retropulsion, possibly pathologic. 3. Small enhancing lesion in the right occipital calvarium, not definitely present on the prior cervical spine from 2022 (though possibly not included within the field of view on that study), suspicious for an additional metastatic or myeloma lesion. 4. Additional probable lesions involving multiple additional ribs. 5. No acute or suspicious finding in the cervical spine. Electronically Signed   By: Lesia Hausen M.D.   On: 01/24/2023 14:33   MR Cervical Spine W and Wo Contrast  Result Date: 01/24/2023 CLINICAL DATA:  Soft tissue mass with bony destruction involving the left seventh rib and T6 and T7 vertebral bodies. EXAM: MRI CERVICAL AND THORACIC SPINE WITHOUT AND WITH CONTRAST TECHNIQUE: Multiplanar and multiecho pulse sequences of the cervical spine, to include the craniocervical junction and cervicothoracic junction, and the thoracic spine, were obtained without and with intravenous contrast. CONTRAST:  7mL GADAVIST GADOBUTROL 1 MMOL/ML IV SOLN COMPARISON:  Same day CTA chest, cervical and thoracic spine MRI 07/20/2020 FINDINGS: MRI CERVICAL SPINE FINDINGS Alignment: There is trace retrolisthesis of C3 on C4 and trace anterolisthesis of C5 on C6. Vertebrae: Vertebral body heights are preserved. Background marrow signal is normal. A T1 hypointense/T2  hyperintense lesion in the clivus is unchanged since 2022, favored benign. There is no suspicious marrow signal abnormality or marrow edema in the cervical spine. There is no abnormal marrow enhancement in the cervical spine. There is a 6 mm enhancing lesion in the right occipital calvarium which was not seen on the prior cervical spine MRI from 2022; however, this area may not has been included within the field of view. Cord: Normal in signal and morphology. Posterior Fossa, vertebral arteries, paraspinal tissues: The imaged posterior fossa is unremarkable. The vertebral artery flow voids are normal. The paraspinal soft tissues are unremarkable. Disc levels: There is overall mild degenerative change in the cervical spine without significant spinal canal or neural foraminal stenosis. MRI THORACIC SPINE FINDINGS Alignment:  Normal. Vertebrae: Background marrow signal is normal. There is infiltrative T1 hypointensity occupying most of the T6 and T7 vertebral bodies extending into the left pedicles and facets. There is a bulky soft tissue mass in the adjacent left paraspinal/subpleural soft tissues with destruction of the seventh rib. The bulk of the soft tissue component measures up to proximally 7.2 cm x 3.4 cm x 2.7 cm. There is extension into  the T6-T7 and T7-T8 neural foramina as well as epidural tumor along the ventral and left dorsal lateral aspects of the spinal canal resulting in moderate spinal canal stenosis with effacement of the thecal sac but no frank cord compression or cord edema. There is mild loss of vertebral body height at both levels consistent with associated pathologic fractures. There is compression deformity of the T11 vertebral body with up to approximately 30% loss of vertebral body height anteriorly and minimal bony retropulsion. There is faint edema along the superior endplate consistent with acute to subacute chronicity. This fracture may be pathologic. There are probable additional  lesions involving multiple additional ribs (for example 25-21, 25-24). The above findings are new since the MRI from 2022. Cord:  There is no cord signal abnormality or abnormal enhancement. Paraspinal and other soft tissues: Unremarkable, aside from the bulky paraspinal tumor described above. Disc levels: There is a small disc protrusion at T10-T11 without significant spinal canal or neural foraminal stenosis. Otherwise, there is overall minimal background degenerative change without significant spinal canal or neural foraminal stenosis. IMPRESSION: 1. Large soft tissue mass in the left paraspinal soft tissues at T6-T7 with destruction of the seventh rib and invasion of the T6 and T7 vertebral bodies with involvement of the left posterior elements as well as epidural tumor in the spinal canal resulting in moderate spinal canal stenosis without frank cord compression or cord edema. 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 2. Acute to subacute compression fracture of the T11 vertebral body with up to approximately 30% loss of vertebral body height and minimal bony retropulsion, possibly pathologic. 3. Small enhancing lesion in the right occipital calvarium, not definitely present on the prior cervical spine from 2022 (though possibly not included within the field of view on that study), suspicious for an additional metastatic or myeloma lesion. 4. Additional probable lesions involving multiple additional ribs. 5. No acute or suspicious finding in the cervical spine. Electronically Signed   By: Lesia Hausen M.D.   On: 01/24/2023 14:33   CT Angio Chest PE W and/or Wo Contrast  Result Date: 01/24/2023 CLINICAL DATA:  Chest pain and shortness of breath for a month. EXAM: CT ANGIOGRAPHY CHEST WITH CONTRAST TECHNIQUE: Multidetector CT imaging of the chest was performed using the standard protocol during bolus administration of intravenous contrast. Multiplanar CT  image reconstructions and MIPs were obtained to evaluate the vascular anatomy. RADIATION DOSE REDUCTION: This exam was performed according to the departmental dose-optimization program which includes automated exposure control, adjustment of the mA and/or kV according to patient size and/or use of iterative reconstruction technique. CONTRAST:  75mL OMNIPAQUE IOHEXOL 350 MG/ML SOLN COMPARISON:  X-ray 01/24/2023 and older.  Previous CT January 2017 FINDINGS: Cardiovascular: The thoracic aorta has a normal course and caliber with minimal calcified plaque. There is a bovine type aortic arch, normal variant. Coronary artery calcifications are seen. Please correlate for other coronary risk factors. Heart is nonenlarged. There is slight wall thickening of the left ventricle. Prominent fat along the intra-atrial septum. Pulmonary arteries show some slight enlargement centrally. Please correlate for any evidence of pulmonary artery hypertension. There is heterogeneous enhancement of the pulmonary arterial tree limiting evaluation for emboli. No obvious large and central embolus although several areas are nondiagnostic. Mediastinum/Nodes: Surgical changes identified with a moderate hiatal hernia. Small thyroid gland. No specific abnormal lymph node enlargement identified in the axillary region, hilum or mediastinum. There are some small nodes identified in the posterior mediastinum, posterior  to the descending thoracic aorta such as series 4, image 86 but not pathologic by size criteria. Lungs/Pleura: There is some linear opacity lung bases likely scar or atelectasis. No consolidation. Breathing motion identified. No pleural effusion or pneumothorax. 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 Benign hepatic cysts as well. Musculoskeletal: There is large destructive mass involving the posterior aspect of the left seventh rib with extension to involve the central canal  of the spine in the associated vertebral bodies of T6 and T7. Cord compression is possible. This mass is estimated in diameter on series 4, image 65 at 8.5 by 4.3 cm. Slight extension along the extrapleural space in the posterior mediastinum as well. There is question of some subtle other lucent lesions identified along the spine but indeterminate. Critical Value/emergent results were called by telephone at the time of interpretation on 01/24/2023 at 8:41 am to provider [facility] , who verbally acknowledged these results. Review of the MIP images confirms the above findings. IMPRESSION: Aggressive soft tissue mass with bone destruction involving the left seventh rib as well as the T6 and T7 vertebral bodies and central canal. A neoplastic process is possible. There also is a possibility of significant cord compression with canal encroachment along the spine. Recommend further evaluation with spine MRI with and without contrast and a whole-body bone scan. There appears to be some other subtle lucent bone lesions along the spine and sternum. Please correlate for any history of known malignancy including such processes as myeloma or other. Poor opacification of the pulmonary arterial tree limiting evaluation. Several areas are non diagnostic for pulmonary emboli. No obvious large and central embolus. Aortic Atherosclerosis (ICD10-I70.0). Electronically Signed   By: Karen Kays M.D.   On: 01/24/2023 11:47   DG Chest 2 View  Result Date: 01/24/2023 CLINICAL DATA:  Chest pain EXAM: CHEST - 2 VIEW COMPARISON:  Chest x-ray dated January 07, 2023 FINDINGS: The heart size and mediastinal contours are within normal limits. Small hiatal hernia. Both lungs are clear. Pleural-based nodular opacity of the left posterior hemithorax. The visualized skeletal structures are unremarkable. IMPRESSION: Pleural-based nodular opacity of the left posterior hemithorax. Recommend further evaluation with contrast enhanced chest CT.  Electronically Signed   By: Allegra Lai M.D.   On: 01/24/2023 09:38   DG Ribs Unilateral Left  Result Date: 01/09/2023 CLINICAL DATA:  Fall, left rib pain. EXAM: LEFT RIBS - 2 VIEW COMPARISON:  Chest radiograph 11/01/2022 FINDINGS: The cardiomediastinal silhouette is stable and within normal limits. Linear opacities in the left midlung likely reflect atelectasis or scar. There is no other focal airspace opacity. There is no pulmonary edema. There is no pleural effusion or pneumothorax There is no displaced rib fracture or other acute osseous abnormality. IMPRESSION: No displaced rib fracture or other acute osseous abnormality identified. Electronically Signed   By: Lesia Hausen M.D.   On: 01/09/2023 17:13   DG Lumbar Spine Complete  Result Date: 01/09/2023 CLINICAL DATA:  Trauma, fall, pain EXAM: LUMBAR SPINE - COMPLETE 4+ VIEW COMPARISON:  12/24/2018 FINDINGS: No recent fracture is seen. Alignment of posterior margins of vertebral bodies appears normal. Small bony spurs are noted. Facet hypertrophy is seen, more so at L5-S1 level. Arterial calcifications are seen in aorta. IMPRESSION: No recent fracture is seen. Degenerative changes are noted, more so in facet joints in lower lumbar spine. Aortic arteriosclerosis. Electronically Signed   By: Ernie Avena M.D.   On: 01/09/2023 17:12  Assessment and plan- Patient is a 57 y.o. female ***   Thank you for this kind referral and the opportunity to participate in the care of this  Patient   Visit Diagnosis 1. Spinal cord mass (HCC)     Dr. Owens Shark, MD, MPH [facility] [phone] 01/24/2023

## 2023-01-24 NOTE — Hospital Course (Addendum)
[name] is a 57 year old female with history of hypertension, depression, anxiety, history of tobacco use, GERD, who presents to the emergency department for chief concerns of chest pain and shortness of breath for 1 month.  Patient also has known multiple myeloma which has been followed as outpatient without requiring any treatment. Imaging study showed large soft tissue mass in left paraspinal soft tissue at T6-T7 with destruction of seventh rib and invasion of T6 and T7 vertebral bodies involving left posterior elements as well as epidural tumor and spinal canal with moderate spinal canal stenosis without frank cord compression or cord edema, acute/subacute compression fracture of T11 vertebral body with up to 30% vertebral body height and minimal bony retropulsion, possibly pathologic. Right occipital calvarium with small enhancing lesion, suspicious for additional metastatic or myeloma lesion. Patient has been seen by neurosurgery, started on IV steroids.   Surgery 8/13: 1. Posterior Segmental Instrumentation T4 to T9; 2. Posterolateral arthrodesis from T4 to T9; 3. Thoracic Laminotomy at T6 and T7.  8/19.  Unable to come off oxygen today.  Patient able to walk on 100 feet today.  Calcium rose up to 12.7 and pamidronate ordered.  CT scan head negative for stroke but did show bone lesions consistent with multiple myeloma.  Cut back on pain medications secondary to altered mental status 8/20.  Patient having a lot of pain this morning.  Calcium up a little bit further.  Started calcitonin until pamidronate starts working.  Oncology increased oxycodone to 20 mg every 12 hours and we resumed low-dose gabapentin and increased oxycodone dose. 8/21--started seeing pt today. Just got back from her right hilum  BM bx per IR. Sister at bedside. Pt very tearful, emotional. Wants to go home. Lot of pain.

## 2023-01-24 NOTE — Assessment & Plan Note (Signed)
Senna docusate 1 tablet nightly as needed for mild constipation, GlycoLax/MiraLAX 17 g p.o. twice daily as needed for moderate, mild constipation, 5 days ordered

## 2023-01-24 NOTE — Assessment & Plan Note (Addendum)
With acute metabolic encephalopathy.  Last calcium 13.8.   Likely increase in calcium is secondary to diuretics and multiple myeloma.  Received pamidronate on 8/19.  Will give calcitonin today.

## 2023-01-24 NOTE — ED Provider Notes (Signed)
[facility] Provider Note    Event Date/Time   First MD Initiated Contact with Patient 01/24/23 0920     (approximate)   History   Chest Pain and Shortness of Breath   HPI  Paula Garrett is a 57 y.o. female here with chest discomfort and shortness of breath.  The patient states that over the last month, she has had progressively worsening left-sided chest pain.  This seems to be somewhat pleuritic.  It is occasionally better with sitting upright.  She states the pain is fairly constant.  She has been seen for this and treated with musculoskeletal pain without significantly.  She said x-rays are unremarkable.  Denies any falls.  No fevers.  No leg swelling.     Physical Exam   Triage Vital Signs: ED Triage Vitals [01/24/23 0845]  Encounter Vitals Group     BP (!) 150/121     Systolic BP Percentile      Diastolic BP Percentile      Pulse Rate 76     Resp 20     Temp 97.8 F (36.6 C)     Temp src      SpO2 98 %     Weight 170 lb (77.1 kg)     Height 5\' 2"  (1.575 m)     Head Circumference      Peak Flow      Pain Score 6     Pain Loc      Pain Education      Exclude from Growth Chart     Most recent vital signs: Vitals:   01/24/23 1430 01/24/23 1513  BP: (!) 108/95 (!) 148/83  Pulse: 72 76  Resp: 19 16  Temp:  98.2 F (36.8 C)  SpO2: 96% 95%     General: Awake, no distress.  CV:  Good peripheral perfusion.  Regular rate and rhythm.  No murmurs. Resp:  Normal work of breathing.  Mild diffuse expiratory wheezes, slightly diminished aeration. Abd:  No distention.  No tenderness or rebound.  No guarding. Other:  No lower extremity edema or asymmetry.   ED Results / Procedures / Treatments   Labs (all labs ordered are listed, but only abnormal results are displayed) Labs Reviewed  BASIC METABOLIC PANEL - Abnormal; Notable for the following components:      Result Value   Sodium 129 (*)    Chloride 93 (*)    Glucose, Bld 100 (*)     Calcium 11.1 (*)    All other components within normal limits  LACTATE DEHYDROGENASE - Abnormal; Notable for the following components:   LDH 67 (*)    All other components within normal limits  CBC  MULTIPLE MYELOMA PANEL, SERUM  KAPPA/LAMBDA LIGHT CHAINS  UPEP/TP, 24-HR URINE  BETA 2 MICROGLOBULIN, SERUM  BASIC METABOLIC PANEL  CBC  POC URINE PREG, ED  TROPONIN I (HIGH SENSITIVITY)  TROPONIN I (HIGH SENSITIVITY)     EKG Normal sinus rhythm, ventricular rate 73.  PR 132, QRS 80, QTc 423 point acute ST elevations or depressions.  No EKG evidence of acute ischemia or infarct.   RADIOLOGY Chest x-ray: Pleural-based nodular opacity left posterior hemithorax CT angio: destructive lesion T6/7 with canal involvement   I also independently reviewed and agree with radiologist interpretations.   PROCEDURES:  Critical Care performed: No  .1-3 Lead EKG Interpretation  Performed by: Shaune Pollack, MD Authorized by: Shaune Pollack, MD     Interpretation: normal  ECG rate:  60-80   ECG rate assessment: normal     Rhythm: sinus rhythm     Ectopy: none     Conduction: normal   Comments:     Indication: chest pain     MEDICATIONS ORDERED IN ED: Medications  levothyroxine (SYNTHROID) tablet 250 mcg (has no administration in time range)  acetaminophen (TYLENOL) tablet 650 mg (has no administration in time range)    Or  acetaminophen (TYLENOL) suppository 650 mg (has no administration in time range)  ondansetron (ZOFRAN) tablet 4 mg (has no administration in time range)    Or  ondansetron (ZOFRAN) injection 4 mg (has no administration in time range)  heparin injection 5,000 Units (has no administration in time range)  ipratropium-albuterol (DUONEB) 0.5-2.5 (3) MG/3ML nebulizer solution 3 mL (has no administration in time range)  morphine (PF) 2 MG/ML injection 2 mg (has no administration in time range)  morphine (PF) 4 MG/ML injection 4 mg (4 mg Intravenous Given  01/24/23 1723)  nicotine (NICODERM CQ - dosed in mg/24 hours) patch 21 mg (has no administration in time range)  amLODipine (NORVASC) tablet 10 mg (10 mg Oral Given 01/24/23 1551)  escitalopram (LEXAPRO) tablet 10 mg (10 mg Oral Given 01/24/23 1551)  irbesartan (AVAPRO) tablet 300 mg (has no administration in time range)  zolpidem (AMBIEN) tablet 5 mg (has no administration in time range)  pantoprazole (PROTONIX) EC tablet 40 mg (40 mg Oral Given 01/24/23 1717)  clonazePAM (KLONOPIN) tablet 1 mg (has no administration in time range)  cyclobenzaprine (FLEXERIL) tablet 7.5 mg (has no administration in time range)  Vitamin D (Ergocalciferol) (DRISDOL) 1.25 MG (50000 UNIT) capsule 50,000 Units (has no administration in time range)  dexamethasone (DECADRON) injection 8 mg (has no administration in time range)  polyethylene glycol (MIRALAX / GLYCOLAX) packet 17 g (17 g Oral Given 01/24/23 1718)  mometasone-formoterol (DULERA) 200-5 MCG/ACT inhaler 2 puff (2 puffs Inhalation Given 01/24/23 1725)  umeclidinium bromide (INCRUSE ELLIPTA) 62.5 MCG/ACT 1 puff (1 puff Inhalation Given 01/24/23 1727)  benzonatate (TESSALON) capsule 100 mg (has no administration in time range)  HYDROmorphone (DILAUDID) injection 0.5 mg (has no administration in time range)  senna-docusate (Senokot-S) tablet 1 tablet (1 tablet Oral Given 01/24/23 1717)  0.9 %  sodium chloride infusion ( Intravenous New Bag/Given 01/24/23 1716)  ipratropium-albuterol (DUONEB) 0.5-2.5 (3) MG/3ML nebulizer solution 3 mL (3 mLs Nebulization Given 01/24/23 1001)  ipratropium-albuterol (DUONEB) 0.5-2.5 (3) MG/3ML nebulizer solution 3 mL (3 mLs Nebulization Given 01/24/23 1001)  iohexol (OMNIPAQUE) 350 MG/ML injection 75 mL (75 mLs Intravenous Contrast Given 01/24/23 1024)  morphine (PF) 4 MG/ML injection 6 mg (6 mg Intravenous Given 01/24/23 1321)  ketorolac (TORADOL) 30 MG/ML injection 15 mg (15 mg Intravenous Given 01/24/23 1320)  ondansetron (ZOFRAN) injection 4 mg (4 mg  Intravenous Given 01/24/23 1321)  gadobutrol (GADAVIST) 1 MMOL/ML injection 7 mL (7 mLs Intravenous Contrast Given 01/24/23 1300)     IMPRESSION / MDM / ASSESSMENT AND PLAN / ED COURSE  I reviewed the triage vital signs and the nursing notes.                              Differential diagnosis includes, but is not limited to, PE, malignancy, pleurisy, pleural effusion, PNA, MSK pain, thoracic pain  Patient's presentation is most consistent with acute presentation with potential threat to life or bodily function.  The patient is on the cardiac monitor to evaluate  for evidence of arrhythmia and/or significant heart rate changes  57 yo F here with left sided chest pain and back pain. 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 Pt has a h/o smoldering MM. CBC is unremarkable. BMP with mild hypercalcemia. Discussed imaging with Radiology and will obtain MRI, plan to admit to medicine for IV Steroids, medical management of pain and steroids. Dr. Smith Robert consulted, who has also discussed case with Dr. Katrinka Blazing of Neurosurgery - pt may be a candidate for surgery for stabilization, but will need to be tx and stabilized at this time. Pt updated on her CT findings and plan of care.    FINAL CLINICAL IMPRESSION(S) / ED DIAGNOSES   Final diagnoses:  Spinal cord mass (HCC)     Rx / DC Orders   ED Discharge Orders     None        Note:  This document was prepared using Dragon voice recognition software and may include unintentional dictation errors.   Shaune Pollack, MD 01/24/23 2026

## 2023-01-24 NOTE — Assessment & Plan Note (Addendum)
Home c Lexapro 10 mg daily, lonazepam 1 mg p.o. twice daily resumed

## 2023-01-24 NOTE — H&P (Signed)
History and Physical   [name]:[id number] DOB: 07-02-1965 DOA: 01/24/2023  PCP: Smitty Cords, DO  Outpatient Specialists: Dr. Smith Robert Patient coming from: home  I have personally briefly reviewed patient's old medical records in [facility] EMR.  Chief Concern: shortness of breath, chest pain  HPI: [name] is a 57 year old female with history of hypertension, depression, anxiety, history of tobacco use, GERD, who presents to the emergency department for chief concerns of chest pain and shortness of breath for 1 month.  Vitals in the ED showed temperature 98.2, respiration rate of 13, heart rate 76, blood pressure 133/106, SpO2 of 94% on room air.  Serum sodium is 129, potassium 4.0, chloride 93, bicarb 23, BUN of 12, serum creatinine 0.79, EGFR greater than 60 Kabbe nonfasting blood glucose of 100, WBC 8.1, hemoglobin 12.6, platelets of 299.  High sensitive troponin with less than 2 and on repeat was 3.  CTA PE: Aggressive soft tissue mass with bone destruction involving the left seventh rib as well as T6 and T7 vertebral bodies and central canal.  Neoplastic process is possible.  Possibility of spinal cord compression and canal encroachment along the spine.  MRI cervical and thoracic spine w & wo contrast: Large soft tissue mass in left paraspinal soft tissue at T6-T7 with destruction of seventh rib and invasion of T6 and T7 vertebral bodies involving left posterior elements as well as epidural tumor and spinal canal with moderate spinal canal stenosis without frank cord compression or cord edema.  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  Acute/subacute compression fracture of T11 vertebral body with up to 30% vertebral body height and minimal bony retropulsion, possibly pathologic.  Right occipital calvarium with small enhancing lesion, suspicious for additional metastatic or myeloma lesion.  ED treatment DuoNebs one-time treatment,  Toradol 50 mg IV one-time dose, morphine 6 mg IV one-time dose, ondansetron 4 mg IV one-time dose. ------------------------- At bedside, patient was able to tell me her name, age, current location, current year. She reports that over the last month, she has been having chest  discomfort with shortness of breath. She reports the chest pain is worst with breathing. She denies trauma to her person. She denies recent weight loss.   She reports the pain is improved with sitting up. She has been treating the pain with msk pain medications without significant improvement. She denies fever, chills, dysuria, diarrhea. She endorses constipation, and her last BM was about 3-4 days ago.  She reports she did intentionally loose weight two years ago and has kept that weight off since.   Social history: She endorses smoking cigarettes. She denies etoh and recreational drug use.   ROS: Constitutional: no weight change, no fever ENT/Mouth: no sore throat, no rhinorrhea Eyes: no eye pain, no vision changes Cardiovascular: + chest pain, + dyspnea,  no edema, no palpitations Respiratory: no cough, no sputum, + wheezing Gastrointestinal: + nausea, no vomiting, no diarrhea, no constipation Genitourinary: no urinary incontinence, no dysuria, no hematuria Musculoskeletal: no arthralgias, no myalgias Skin: no skin lesions, no pruritus, Neuro: + weakness, no loss of consciousness, no syncope Psych: no anxiety, no depression, + decrease appetite Heme/Lymph: no bruising, no bleeding  ED Course: Discussed with emergency medicine provider, patient requiring hospitalization for chief concerns of destructive lesion centered around T6, T7 vertebral bodies with substantial destruction of both T6 and T7 vertebral bodies, discerning for spinal cord compression.  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 Smith Robert) and neurosurgery (Dr. Madaline Brilliant) are aware Per EDP discussion with neurosurgical service and medical oncology, tentative plan for neurosurgery next Tuesday, 8/13 AM team to coordinate with specialists as appropriate  At risk for constipation Senna docusate 1 tablet nightly as needed for mild constipation, GlycoLax/MiraLAX 17 g p.o. twice daily as needed for moderate, mild constipation, 5 days ordered  Anxiety Home c Lexapro 10 mg daily, lonazepam 1 mg p.o. twice daily resumed  Hypercalcemia Mild NS 125 ml/hr, 36 hours ordered AM team to consider pamidronate should sCalcium not improve with IV fluid hydration   Asthma Does not appear to be in acute exacerbation Home maintenance inhaler resumed on admission DuoNebs every 6 hours as needed for shortness of breath and wheezing, 3 days ordered  Barrett's esophagus without dysplasia Pantoprazole 40 mg p.o. twice daily before meals ordered  Benign essential hypertension Home amlodipine 10 mg daily, home telmisartan 80 mg equivalent resumed on admission  Hypothyroidism Home levothyroxine 250 mcg daily before breakfast resumed  Insomnia Home zolpidem 5 mg nightly as needed for sleep resumed  Chart reviewed.   DVT  prophylaxis: Heparin 5000 units subcutaneous every 8 hours Code Status: Full code Diet: Heart healthy Family Communication: no, patient updates her family Disposition Plan: Pending neurosurgery evaluation and final recommendation Consults called: Neurosurgery, medical oncology Admission status: Telemetry medical, inpatient  Past Medical History:  Diagnosis Date   Anxiety    Arthritis    Asthma    Barrett's esophagus 2015   COVID-19    03/10/20   Depression    GERD (gastroesophageal reflux disease)    Hypertension    Hypothyroidism    Pneumonia    Smoldering myeloma    Thyroid disease    Past Surgical History:  Procedure Laterality Date   ABLATION  2006   BREAST CYST ASPIRATION Left 1998   BREAST SURGERY     cyst aspiration   BREAST SURGERY  1998   cyst aspiration   COLONOSCOPY  03/2014   COLONOSCOPY WITH PROPOFOL N/A 01/06/2020   Procedure: COLONOSCOPY WITH PROPOFOL;  Surgeon: Toledo, Boykin Nearing, MD;  Location: [facility] ENDOSCOPY;  Service: Gastroenterology;  Laterality: N/A;   ENDOMETRIAL ABLATION     ESOPHAGOGASTRODUODENOSCOPY (EGD) WITH PROPOFOL N/A 08/15/2015   Procedure: ESOPHAGOGASTRODUODENOSCOPY (EGD) WITH PROPOFOL;  Surgeon: Christena Deem, MD;  Location: [facility] ENDOSCOPY;  Service: Endoscopy;  Laterality: N/A;   ESOPHAGOGASTRODUODENOSCOPY (EGD) WITH PROPOFOL N/A 01/22/2016   Procedure: ESOPHAGOGASTRODUODENOSCOPY (EGD) WITH PROPOFOL;  Surgeon: Christena Deem, MD;  Location: [facility] ENDOSCOPY;  Service: Endoscopy;  Laterality: N/A;   ESOPHAGOGASTRODUODENOSCOPY (EGD) WITH PROPOFOL N/A 01/06/2020   Procedure: ESOPHAGOGASTRODUODENOSCOPY (EGD) WITH PROPOFOL;  Surgeon: Augusta,  Boykin Nearing, MD;  Location: [facility] ENDOSCOPY;  Service: Gastroenterology;  Laterality: N/A;   GASTRIC BYPASS  2005   HERNIA REPAIR     NASAL SINUS SURGERY     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   TOTAL THYROIDECTOMY     TUBAL LIGATION     Social History:  reports that she has been smoking cigarettes.  She started smoking about 42 years ago. She has a 42.9 pack-year smoking history. She has never used smokeless tobacco. She reports current alcohol use of about 10.0 - 12.0 standard drinks of alcohol per week. She reports that she does not use drugs.  Allergies  Allergen Reactions   Hctz [Hydrochlorothiazide]     Hyponatremia    Family History  Problem Relation Age of Onset   Heart disease Mother    COPD Mother    Arthritis Mother    Cancer Mother        uterine   Lupus Mother    Anxiety disorder Mother    Depression Mother    Drug abuse Mother    COPD Father    Arthritis Father    Heart disease Father    Heart attack Father    Alcohol abuse Father    Heart disease Brother    Heart attack Brother    Drug abuse Brother    Anxiety disorder Brother    Depression Brother    Heart disease Brother    Cancer Brother        liver cancer age 7 y.o   Diabetes Maternal Grandmother    Diabetes Maternal Grandfather    Diabetes Paternal Grandmother    Diabetes Paternal Grandfather    Breast cancer Neg Hx    Family history: Family history reviewed and not pertinent.  Prior to Admission medications   Medication Sig Start Date End Date Taking? Authorizing Provider  amLODipine (NORVASC) 10 MG tablet Take 1 tablet (10 mg total) by mouth daily. 11/01/22  Yes Kara Dies, NP  Budeson-Glycopyrrol-Formoterol (BREZTRI AEROSPHERE) 160-9-4.8 MCG/ACT AERO Inhale 2 puffs into the lungs in the morning and at bedtime. 12/13/22  Yes Dgayli, Lianne Bushy, MD  cetirizine (ZYRTEC) 10 MG tablet Take 1 tablet (10 mg total) by mouth daily. 03/20/22  Yes McLean-Scocuzza, Pasty Spillers, MD  clonazePAM (KLONOPIN) 1 MG tablet Take 1 tablet (1 mg total) by mouth 2 (two) times daily. 11/20/22  Yes Karamalegos, Netta Neat, DO  cyanocobalamin (VITAMIN B12) 1000 MCG/ML injection INJECT 1 ML IN THE MUSCLE EVERY 30 DAYS 03/15/22  Yes McLean-Scocuzza, Pasty Spillers, MD  cyclobenzaprine (FLEXERIL) 10 MG tablet Take 0.5-1 tablets (5-10  mg total) by mouth 3 (three) times daily as needed for muscle spasms. 01/09/23  Yes Karamalegos, Netta Neat, DO  escitalopram (LEXAPRO) 10 MG tablet Take 1 tablet (10 mg total) by mouth daily. 11/20/22  Yes Karamalegos, Netta Neat, DO  levothyroxine (SYNTHROID) 125 MCG tablet Take 250 mcg by mouth daily before breakfast. 11/20/22  Yes [provider]  pantoprazole (PROTONIX) 40 MG tablet 30 min before lunch or dinner 03/20/22  Yes McLean-Scocuzza, Pasty Spillers, MD  telmisartan (MICARDIS) 80 MG tablet 1 whole pill qd goal BP <130/<80. 03/20/22  Yes McLean-Scocuzza, Pasty Spillers, MD  Vitamin D, Ergocalciferol, (DRISDOL) 1.25 MG (50000 UNIT) CAPS capsule Take 50,000 Units by mouth every 7 (seven) days.   Yes [provider]  zolpidem (AMBIEN) 10 MG tablet Take 0.5-1 tablets (5-10 mg total) by mouth at bedtime as needed for sleep. 11/20/22  Yes  Karamalegos, Alexander J, DO  albuterol (VENTOLIN HFA) 108 (90 Base) MCG/ACT inhaler Inhale 2 puffs into the lungs every 6 (six) hours as needed for wheezing or shortness of breath. Patient not taking: Reported on 01/24/2023 03/20/22   McLean-Scocuzza, Pasty Spillers, MD  montelukast (SINGULAIR) 10 MG tablet Take 1 tablet (10 mg total) by mouth at bedtime. Patient not taking: Reported on 01/24/2023 03/20/22   McLean-Scocuzza, Pasty Spillers, MD  nicotine (NICODERM CQ - DOSED IN MG/24 HOURS) 21 mg/24hr patch Place 1 patch (21 mg total) onto the skin daily. Patient not taking: Reported on 01/24/2023 12/13/22 01/24/23  Raechel Chute, MD  nicotine polacrilex (NICOTINE MINI) 2 MG lozenge Take 1 lozenge (2 mg total) by mouth every 2 (two) hours as needed for smoking cessation. Patient not taking: Reported on 01/24/2023 12/13/22 03/13/23  Raechel Chute, MD  ondansetron (ZOFRAN) 8 MG tablet Take 1 tablet (8 mg total) by mouth every 8 (eight) hours as needed for nausea or vomiting. Patient not taking: Reported on 01/24/2023 06/12/21   Mauro Kaufmann, NP  predniSONE (DELTASONE) 20 MG tablet Take  daily with food. Start with 60mg  (3 pills) x 2 days, then reduce to 40mg  (2 pills) x 2 days, then 20mg  (1 pill) x 3 days Patient not taking: Reported on 01/24/2023 01/09/23   Smitty Cords, DO  Spacer/Aero-Holding Chambers (AEROCHAMBER MV) inhaler Use as instructed 12/13/22   Raechel Chute, MD  SYRINGE-NEEDLE, DISP, 3 ML 25G X 1-1/2" 3 ML MISC 1 Device by Does not apply route every 30 (thirty) days. With B12 shot 10/25/21   McLean-Scocuzza, Pasty Spillers, MD   Physical Exam: Vitals:   01/24/23 1330 01/24/23 1400 01/24/23 1430 01/24/23 1513  BP: (!) 133/103 (!) 132/92 (!) 108/95 (!) 148/83  Pulse: 76 71 72 76  Resp: 13 11 19 16   Temp:    98.2 F (36.8 C)  TempSrc:    Oral  SpO2: 94% 93% 96% 95%  Weight:      Height:       Constitutional: appears age appropriate, NAD, calm Eyes: PERRL, lids and conjunctivae normal ENMT: Mucous membranes are moist. Posterior pharynx clear of any exudate or lesions. Age-appropriate dentition. Hearing appropriate Neck: normal, supple, no masses, no thyromegaly Respiratory: clear to auscultation bilaterally, mild bilateral wheezing, no crackles. Normal respiratory effort. No accessory muscle use.  Cardiovascular: Regular rate and rhythm, no murmurs / rubs / gallops. No extremity edema. 2+ pedal pulses. No carotid bruits.  Abdomen: obese abdomen, no tenderness, no masses palpated, no hepatosplenomegaly. Bowel sounds positive.  Musculoskeletal: no clubbing / cyanosis. No joint deformity upper and lower extremities. Good ROM, no contractures, no atrophy. Normal muscle tone.  Skin: no rashes, lesions, ulcers. No induration Neurologic: Sensation intact. Strength 5/5 in all 4.  Psychiatric: Normal judgment and insight. Alert and oriented x 3. Normal mood.   EKG: independently reviewed, showing sinus rhythm with rate of 73, QTc 423  Chest x-ray on Admission: I personally reviewed and I agree with radiologist reading as below.  MR THORACIC SPINE W WO  CONTRAST  Result Date: 01/24/2023 CLINICAL DATA:  Soft tissue mass with bony destruction involving the left seventh rib and T6 and T7 vertebral bodies. EXAM: MRI CERVICAL AND THORACIC SPINE WITHOUT AND WITH CONTRAST TECHNIQUE: Multiplanar and multiecho pulse sequences of the cervical spine, to include the craniocervical junction and cervicothoracic junction, and the thoracic spine, were obtained without and with intravenous contrast. CONTRAST:  7mL GADAVIST GADOBUTROL 1 MMOL/ML IV SOLN COMPARISON:  Same  day CTA chest, cervical and thoracic spine MRI 07/20/2020 FINDINGS: MRI CERVICAL SPINE FINDINGS Alignment: There is trace retrolisthesis of C3 on C4 and trace anterolisthesis of C5 on C6. Vertebrae: Vertebral body heights are preserved. Background marrow signal is normal. A T1 hypointense/T2 hyperintense lesion in the clivus is unchanged since 2022, favored benign. There is no suspicious marrow signal abnormality or marrow edema in the cervical spine. There is no abnormal marrow enhancement in the cervical spine. There is a 6 mm enhancing lesion in the right occipital calvarium which was not seen on the prior cervical spine MRI from 2022; however, this area may not has been included within the field of view. Cord: Normal in signal and morphology. Posterior Fossa, vertebral arteries, paraspinal tissues: The imaged posterior fossa is unremarkable. The vertebral artery flow voids are normal. The paraspinal soft tissues are unremarkable. Disc levels: There is overall mild degenerative change in the cervical spine without significant spinal canal or neural foraminal stenosis. MRI THORACIC SPINE FINDINGS Alignment:  Normal. Vertebrae: Background marrow signal is normal. There is infiltrative T1 hypointensity occupying most of the T6 and T7 vertebral bodies extending into the left pedicles and facets. There is a bulky soft tissue mass in the adjacent left paraspinal/subpleural soft tissues with destruction of the seventh  rib. The bulk of the soft tissue component measures up to proximally 7.2 cm x 3.4 cm x 2.7 cm. 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 There is mild loss of vertebral body height at both levels consistent with associated pathologic fractures. There is compression deformity of the T11 vertebral body with up to approximately 30% loss of vertebral body height anteriorly and minimal bony retropulsion. There is faint edema along the superior endplate consistent with acute to subacute chronicity. This fracture may be pathologic. There are probable additional lesions involving multiple additional ribs (for example 25-21, 25-24). The above findings are new since the MRI from 2022. Cord:  There is no cord signal abnormality or abnormal enhancement. Paraspinal and other soft tissues: Unremarkable, aside from the bulky paraspinal tumor described above. Disc levels: There is a small disc protrusion at T10-T11 without significant spinal canal or neural foraminal stenosis. Otherwise, there is overall minimal background degenerative change without significant spinal canal or neural foraminal stenosis. IMPRESSION: 1. Large soft tissue mass in the left paraspinal soft tissues at T6-T7 with destruction of the seventh rib and invasion of the T6 and T7 vertebral bodies with involvement of the left posterior elements as well as epidural tumor in the spinal canal resulting in moderate spinal canal stenosis without frank cord compression or cord edema. 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 2. Acute to subacute compression fracture of the T11 vertebral body with up to approximately 30% loss of vertebral body height and minimal bony retropulsion,  possibly pathologic. 3. Small enhancing lesion in the right occipital calvarium, not definitely present on the prior cervical spine from 2022 (though possibly not included within the field of view on that study), suspicious for an additional metastatic or myeloma lesion. 4. Additional probable lesions involving multiple additional ribs. 5. No acute or suspicious finding in the cervical spine. Electronically Signed   By: Lesia Hausen M.D.   On: 01/24/2023 14:33   MR Cervical  Spine W and Wo Contrast  Result Date: 01/24/2023 CLINICAL DATA:  Soft tissue mass with bony destruction involving the left seventh rib and T6 and T7 vertebral bodies. EXAM: MRI CERVICAL AND THORACIC SPINE WITHOUT AND WITH CONTRAST TECHNIQUE: Multiplanar and multiecho pulse sequences of the cervical spine, to include the craniocervical junction and cervicothoracic junction, and the thoracic spine, were obtained without and with intravenous contrast. CONTRAST:  7mL GADAVIST GADOBUTROL 1 MMOL/ML IV SOLN COMPARISON:  Same day CTA chest, cervical and thoracic spine MRI 07/20/2020 FINDINGS: MRI CERVICAL SPINE FINDINGS Alignment: There is trace retrolisthesis of C3 on C4 and trace anterolisthesis of C5 on C6. Vertebrae: Vertebral body heights are preserved. Background marrow signal is normal. A T1 hypointense/T2 hyperintense lesion in the clivus is unchanged since 2022, favored benign. There is no suspicious marrow signal abnormality or marrow edema in the cervical spine. There is no abnormal marrow enhancement in the cervical spine. There is a 6 mm enhancing lesion in the right occipital calvarium which was not seen on the prior cervical spine MRI from 2022; however, this area may not has been included within the field of view. Cord: Normal in signal and morphology. Posterior Fossa, vertebral arteries, paraspinal tissues: The imaged posterior fossa is unremarkable. The vertebral artery flow voids are normal. The paraspinal soft tissues are  unremarkable. Disc levels: There is overall mild degenerative change in the cervical spine without significant spinal canal or neural foraminal stenosis. MRI THORACIC SPINE FINDINGS Alignment:  Normal. Vertebrae: Background marrow signal is normal. There is infiltrative T1 hypointensity occupying most of the T6 and T7 vertebral bodies extending into the left pedicles and facets. There is a bulky soft tissue mass in the adjacent left paraspinal/subpleural soft tissues with destruction of the seventh rib. The bulk of the soft tissue component measures up to proximally 7.2 cm x 3.4 cm x 2.7 cm. 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 There is mild loss of vertebral body height at both levels consistent with associated pathologic fractures. There is compression deformity of the T11 vertebral body with up to approximately 30% loss of vertebral body height anteriorly and minimal bony retropulsion. There is faint edema along the superior endplate consistent with acute to subacute chronicity. This fracture may be pathologic. There are probable additional lesions involving multiple additional ribs (for example 25-21, 25-24). The above findings are new since the MRI from 2022. Cord:  There is no cord signal abnormality or abnormal enhancement. Paraspinal and other soft tissues: Unremarkable, aside from the bulky paraspinal tumor described above. Disc levels: There is a small disc protrusion at T10-T11 without significant spinal canal or neural foraminal stenosis. Otherwise, there is overall minimal background degenerative change without significant spinal canal or neural foraminal stenosis. IMPRESSION: 1. Large soft tissue mass in the left paraspinal soft tissues at T6-T7 with destruction of  the seventh rib and invasion of the T6 and T7 vertebral bodies with involvement of the left posterior elements as well as epidural tumor in the spinal canal resulting in moderate spinal canal stenosis without frank cord compression or cord edema. 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 2. Acute to subacute compression fracture of the T11 vertebral body with up to approximately 30% loss of vertebral body height and  minimal bony retropulsion, possibly pathologic. 3. Small enhancing lesion in the right occipital calvarium, not definitely present on the prior cervical spine from 2022 (though possibly not included within the field of view on that study), suspicious for an additional metastatic or myeloma lesion. 4. Additional probable lesions involving multiple additional ribs. 5. No acute or suspicious finding in the cervical spine. Electronically Signed   By: Lesia Hausen M.D.   On: 01/24/2023 14:33   CT Angio Chest PE W and/or Wo Contrast  Result Date: 01/24/2023 CLINICAL DATA:  Chest pain and shortness of breath for a month. EXAM: CT ANGIOGRAPHY CHEST WITH CONTRAST TECHNIQUE: Multidetector CT imaging of the chest was performed using the standard protocol during bolus administration of intravenous contrast. Multiplanar CT image reconstructions and MIPs were obtained to evaluate the vascular anatomy. RADIATION DOSE REDUCTION: This exam was performed according to the departmental dose-optimization program which includes automated exposure control, adjustment of the mA and/or kV according to patient size and/or use of iterative reconstruction technique. CONTRAST:  75mL OMNIPAQUE IOHEXOL 350 MG/ML SOLN COMPARISON:  X-ray 01/24/2023 and older.  Previous CT January 2017 FINDINGS: Cardiovascular: The thoracic aorta has a normal course and caliber with minimal calcified plaque. There is a bovine type aortic arch, normal variant. Coronary artery calcifications are  seen. Please correlate for other coronary risk factors. Heart is nonenlarged. There is slight wall thickening of the left ventricle. Prominent fat along the intra-atrial septum. Pulmonary arteries show some slight enlargement centrally. Please correlate for any evidence of pulmonary artery hypertension. There is heterogeneous enhancement of the pulmonary arterial tree limiting evaluation for emboli. No obvious large and central embolus although several areas are nondiagnostic. Mediastinum/Nodes: Surgical changes identified with a moderate hiatal hernia. Small thyroid gland. No specific abnormal lymph node enlargement identified in the axillary region, hilum or mediastinum. There are some small nodes identified in the posterior mediastinum, posterior to the descending thoracic aorta such as series 4, image 86 but not pathologic by size criteria. Lungs/Pleura: There is some linear opacity lung bases likely scar or atelectasis. No consolidation. Breathing motion identified. No pleural effusion or pneumothorax. 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 Benign hepatic cysts as well. Musculoskeletal: There is large destructive mass involving the posterior aspect of the left seventh rib with extension to involve the central canal of the spine in the associated vertebral bodies of T6 and T7. Cord compression is possible. This mass is estimated in diameter on series 4, image 65 at 8.5 by 4.3 cm. Slight extension along the extrapleural space in the posterior mediastinum as well. There is question of some subtle other lucent lesions identified along the spine but indeterminate. Critical Value/emergent results were called by telephone at the time of interpretation on 01/24/2023 at 8:41 am to provider [facility] , who verbally acknowledged these results. Review of the MIP images confirms the above findings. IMPRESSION: Aggressive soft tissue mass with bone destruction  involving the left seventh rib as well as the T6 and T7 vertebral bodies and central canal. A neoplastic process is possible. There also is a possibility of significant cord compression with canal encroachment along the spine. Recommend further evaluation with spine MRI with and without contrast and a whole-body bone scan. There appears to be some other subtle lucent bone lesions along the spine and sternum. Please correlate for any history of known malignancy including such processes as myeloma or other. Poor opacification of the pulmonary arterial tree limiting evaluation. Several  areas are non diagnostic for pulmonary emboli. No obvious large and central embolus. Aortic Atherosclerosis (ICD10-I70.0). Electronically Signed   By: Karen Kays M.D.   On: 01/24/2023 11:47   DG Chest 2 View  Result Date: 01/24/2023 CLINICAL DATA:  Chest pain EXAM: CHEST - 2 VIEW COMPARISON:  Chest x-ray dated January 07, 2023 FINDINGS: The heart size and mediastinal contours are within normal limits. Small hiatal hernia. Both lungs are clear. Pleural-based nodular opacity of the left posterior hemithorax. The visualized skeletal structures are unremarkable. IMPRESSION: Pleural-based nodular opacity of the left posterior hemithorax. Recommend further evaluation with contrast enhanced chest CT. Electronically Signed   By: Allegra Lai M.D.   On: 01/24/2023 09:38    Labs on Admission: I have personally reviewed following labs  CBC: Recent Labs  Lab 01/24/23 0845  WBC 8.1  HGB 12.6  HCT 37.0  MCV 94.9  PLT 299   Basic Metabolic Panel: Recent Labs  Lab 01/24/23 0845  NA 129*  K 4.0  CL 93*  CO2 23  GLUCOSE 100*  BUN 12  CREATININE 0.79  CALCIUM 11.1*   GFR: Estimated Creatinine Clearance: 72.8 mL/min (by C-G formula based on SCr of 0.79 mg/dL).  Urine analysis:    Component Value Date/Time   COLORURINE YELLOW 07/14/2019 1459   APPEARANCEUR Cloudy (A) 10/27/2020 0831   LABSPEC 1.010 07/14/2019 1459    PHURINE 5.5 07/14/2019 1459   GLUCOSEU Negative 10/27/2020 0831   GLUCOSEU NEGATIVE 07/25/2017 0823   HGBUR NEGATIVE 07/14/2019 1459   BILIRUBINUR Negative 10/27/2020 0831   KETONESUR NEGATIVE 07/14/2019 1459   PROTEINUR Trace 10/27/2020 0831   PROTEINUR NEGATIVE 07/14/2019 1459   UROBILINOGEN 0.2 06/01/2018 0927   UROBILINOGEN 0.2 07/25/2017 0823   NITRITE Positive (A) 10/27/2020 0831   NITRITE NEGATIVE 07/14/2019 1459   LEUKOCYTESUR 3+ (A) 10/27/2020 0831   LEUKOCYTESUR NEGATIVE 07/14/2019 1459   This document was prepared using Dragon Voice Recognition software and may include unintentional dictation errors.  Dr. Sedalia Muta [facility]  If 7PM-7AM, please contact overnight-coverage provider If 7AM-7PM, please contact day attending provider [email or web address]  01/24/2023, 4:56 PM

## 2023-01-24 NOTE — Plan of Care (Signed)

## 2023-01-24 NOTE — Progress Notes (Signed)
Neurosurgery brief note full consult note to follow  Was contacted by the emergency room and the oncology team regarding this patient.  Per reports she is a 57 year old female who had presented with chest discomfort and shortness of breath.  She stated this is occurred over the last month progressively worse left-sided chest pain.   She denied any difficulty with walking motor or sensory difficulties in her lower extremities radicular pain loss of bowel or bladder function.  She apparently is neurologically intact per report emergency room as well as the oncology teams.  EXAM: CT ANGIOGRAPHY CHEST WITH CONTRAST IMPRESSION: Aggressive soft tissue mass with bone destruction involving the left seventh rib as well as the T6 and T7 vertebral bodies and central canal. A neoplastic process is possible. There also is a possibility of significant cord compression with canal encroachment along the spine.   Recommend further evaluation with spine MRI with and without contrast and a whole-body bone scan. There appears to be some other subtle lucent bone lesions along the spine and sternum.   Please correlate for any history of known malignancy including such processes as myeloma or other.   Poor opacification of the pulmonary arterial tree limiting evaluation. Several areas are non diagnostic for pulmonary emboli. No obvious large and central embolus.   Aortic Atherosclerosis (ICD10-I70.0).     Electronically Signed   By: Karen Kays M.D.   On: 01/24/2023 11:47  MRI imaging of the thoracic spine demonstrates a large soft tissue mass consistent with that seen on the CT scan imaging  AP: Overall the patient appears to have a bony destructive lesion centered around T6 and T7 vertebral bodies with substantial destruction of both the T6 and T7 vertebral bodies by the soft tissue mass which then extends over and disrupt the left seventh rib as well with some encroachment into the central spinal  canal and abutment of the spinal cord.  Overall it is thought to be concerning given the other irregular lesions appearing in the other vertebral bodies consistent with multiple myeloma or some other neoplastic process. The patient is being admitted to the hospital for further workup and evaluation along with the oncology teams.  This likely warrants surgical stabilization prior to substantial therapy for risk of loss of bony stability and potential worsening, compromise of the canal.  It is reassuring that she is by report neurologically intact.  We will arrange surgical stabilization next week with Dr. Katrinka Blazing to be done in conjunction with the oncology team treatment planning.  In the meantime I would place the patient in a TLSO brace when out of bed or mobilizing.  She can proceed with activity as tolerated.  Peter Garter. Madaline Brilliant, MD Neurosurgery

## 2023-01-24 NOTE — Assessment & Plan Note (Addendum)
Continue levothyroxine 

## 2023-01-24 NOTE — Assessment & Plan Note (Addendum)
Home amlodipine 10 mg daily, home telmisartan 80 mg equivalent resumed on admission

## 2023-01-25 DIAGNOSIS — G9589 Other specified diseases of spinal cord: Secondary | ICD-10-CM | POA: Diagnosis not present

## 2023-01-25 DIAGNOSIS — M4804 Spinal stenosis, thoracic region: Secondary | ICD-10-CM | POA: Diagnosis not present

## 2023-01-25 DIAGNOSIS — C9000 Multiple myeloma not having achieved remission: Secondary | ICD-10-CM

## 2023-01-25 MED ORDER — OXYCODONE-ACETAMINOPHEN 5-325 MG PO TABS
1.0000 | ORAL_TABLET | ORAL | Status: DC | PRN
  Administered 2023-01-25 – 2023-01-27 (×12): 1 via ORAL
  Filled 2023-01-25 (×12): qty 1

## 2023-01-25 NOTE — H&P (View-Only) (Signed)
Neurosurgery-New Consultation Evaluation 01/25/2023 Paula Garrett [id number]  Identifying Statement: Paula Garrett is a 57 y.o. female from [place] [address] with   Physician Requesting Consultation: No ref. provider found  History of Present Illness: The patient is a 57 year old right-hand female past medical history significant for previously known myeloma not having previously required treatment who presented to the emergency room with pleuritic left-sided chest pain worsening over the last several months but particularly exacerbated in the last 1 week.  She presented to the emergency room with shortness of breath and underwent CT scan angiography of the chest which demonstrated a large soft tissue mass with bony distraction involving the T6 and T7 vertebral bodies with encroachment of the central canal and compression of the spinal cord and thoracic region with extension over into the vertebral body toward the rib.  She is admitted to the oncology service for further consideration of treatment of this presumed progression of her myeloma.  Neurosurgery is consulted for further evaluation of this thoracic mass  The patient herself denies any radicular pain numbness or tingling in her lower extremities.  She is able to ambulate and actually describes that she feels less thoracic pain when she is standing upright and slightly worse when she is lying recumbent or on her side.  She denies any bowel or bladder difficulties she denies any numbness or tingling in her else in her extremities.  She notes that she has intermittent radiating spasms and pain around her chest wall around the level of T6-7 distribution    Past Medical History:  Past Medical History:  Diagnosis Date   Anxiety    Arthritis    Asthma    Barrett's esophagus 2015   COVID-19    03/10/20   Depression    GERD (gastroesophageal reflux disease)    Hypertension    Hypothyroidism    Pneumonia    Smoldering myeloma    Thyroid  disease     Social History: Social History   Socioeconomic History   Marital status: Divorced    Spouse name: Not on file   Number of children: 2   Years of education: Not on file   Highest education level: GED or equivalent  Occupational History   Occupation: Part-time  Tobacco Use   Smoking status: Every Day    Current packs/day: 1.00    Average packs/day: 1 pack/day for 42.9 years (42.9 ttl pk-yrs)    Types: Cigarettes    Start date: 03/15/1980   Smokeless tobacco: Never  Vaping Use   Vaping status: Never Used  Substance and Sexual Activity   Alcohol use: Yes    Alcohol/week: 10.0 - 12.0 standard drinks of alcohol    Types: 8 - 10 Glasses of wine, 2 Standard drinks or equivalent per week   Drug use: No   Sexual activity: Yes    Partners: Male  Other Topics Concern   Not on file  Social History Narrative   Lives in [place] single, divorced       Work - 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       Diet - healthy diet   Exercise - limited   Caffeine use: daily   Social Determinants of Health   Financial Resource Strain: Low Risk  (11/01/2022)   Overall Financial Resource Strain (CARDIA)    Difficulty of Paying Living Expenses: Not very hard  Food Insecurity: No Food Insecurity (01/24/2023)   Hunger Vital Sign    Worried About Running Out of  Food in the Last Year: Never true    Ran Out of Food in the Last Year: Never true  Transportation Needs: No Transportation Needs (01/24/2023)   PRAPARE - Administrator, Civil Service (Medical): No    Lack of Transportation (Non-Medical): No  Physical Activity: Insufficiently Active (11/01/2022)   Exercise Vital Sign    Days of Exercise per Week: 3 days    Minutes of Exercise per Session: 20 min  Stress: Stress Concern Present (11/01/2022)   [employer] of Occupational Health - Occupational Stress Questionnaire    Feeling of Stress : Very much  Social Connections: Socially Isolated (11/01/2022)   Social  Connection and Isolation Panel [NHANES]    Frequency of Communication with Friends and Family: More than three times a week    Frequency of Social Gatherings with Friends and Family: Once a week    Attends Religious Services: Never    Database administrator or Organizations: No    Attends Engineer, structural: Not on file    Marital Status: Divorced  Intimate Partner Violence: Not At Risk (01/24/2023)   Humiliation, Afraid, Rape, and Kick questionnaire    Fear of Current or Ex-Partner: No    Emotionally Abused: No    Physically Abused: No    Sexually Abused: No   Living arrangements (living alone, with partner): Lives at home  Family History: Family History  Problem Relation Age of Onset   Heart disease Mother    COPD Mother    Arthritis Mother    Cancer Mother        uterine   Lupus Mother    Anxiety disorder Mother    Depression Mother    Drug abuse Mother    COPD Father    Arthritis Father    Heart disease Father    Heart attack Father    Alcohol abuse Father    Heart disease Brother    Heart attack Brother    Drug abuse Brother    Anxiety disorder Brother    Depression Brother    Heart disease Brother    Cancer Brother        liver cancer age 78 y.o   Diabetes Maternal Grandmother    Diabetes Maternal Grandfather    Diabetes Paternal Grandmother    Diabetes Paternal Grandfather    Breast cancer Neg Hx     Review of Systems:  Review of Systems - General ROS: Negative Psychological ROS: Negative Ophthalmic ROS: Negative ENT ROS: Negative Hematological and Lymphatic ROS: Negative  Endocrine ROS: Negative Respiratory ROS: Negative Cardiovascular ROS: Negative Gastrointestinal ROS: Negative Genito-Urinary ROS: Negative Musculoskeletal ROS: Notes that she has spasming muscle pains intermittently around her rib cage on her left and side Neurological ROS: Negative Dermatological ROS: Negative  Physical Exam: BP (!) 145/98 (BP Location: Left Arm)    Pulse 78   Temp 98.4 F (36.9 C) (Oral)   Resp 18   Ht 5\' 2"  (1.575 m)   Wt 77.1 kg   SpO2 96%   BMI 31.09 kg/m  Body mass index is 31.09 kg/m. Body surface area is 1.84 meters squared. General appearance: Alert, cooperative, in no acute distress Head: Normocephalic, atraumatic Eyes: Normal, EOM intact Oropharynx: Moist without lesions Neck: Supple, no tenderness Heart: Normal, regular rate and rhythm, without murmur Lungs: Clear to auscultation, good air exchange Abdomen: Soft, nondistended Ext: No edema in LE bilaterally, good distal pulses  Neurologic exam:  Mental status: alertness: alert, orientation: person,  place, time, affect: normal Speech: fluent and clear Cranial nerves:  II: Visual fields are full by confrontation, no ptosis III/IV/VI: extra-ocular motions intact bilaterally V/VII:no evidence of facial droop or weakness and facial sensation intact VIII: hearing normal XI: trapezius strength symmetric,  sternocleidomastoid strength symmetric XII: tongue strength symmetric  Motor:strength symmetric 5/5, normal muscle mass and tone in all extremities and no pronator drift Sensory: intact to light touch in all extremities Reflexes: 2+ and symmetric bilaterally for arms and legs Coordination: intact finger to nose Gait: normal   Laboratory: Results for orders placed or performed during the hospital encounter of 01/24/23  Basic metabolic panel  Result Value Ref Range   Sodium 129 (L) 135 - 145 mmol/L   Potassium 4.0 3.5 - 5.1 mmol/L   Chloride 93 (L) 98 - 111 mmol/L   CO2 23 22 - 32 mmol/L   Glucose, Bld 100 (H) 70 - 99 mg/dL   BUN 12 6 - 20 mg/dL   Creatinine, Ser 1.61 0.44 - 1.00 mg/dL   Calcium 09.6 (H) 8.9 - 10.3 mg/dL   GFR, Estimated >04 >54 mL/min   Anion gap 13 5 - 15  CBC  Result Value Ref Range   WBC 8.1 4.0 - 10.5 K/uL   RBC 3.90 3.87 - 5.11 MIL/uL   Hemoglobin 12.6 12.0 - 15.0 g/dL   HCT 09.8 11.9 - 14.7 %   MCV 94.9 80.0 - 100.0 fL   MCH  32.3 26.0 - 34.0 pg   MCHC 34.1 30.0 - 36.0 g/dL   RDW 82.9 56.2 - 13.0 %   Platelets 299 150 - 400 K/uL   nRBC 0.0 0.0 - 0.2 %  Lactate dehydrogenase  Result Value Ref Range   LDH 67 (L) 98 - 192 U/L  Basic metabolic panel  Result Value Ref Range   Sodium 127 (L) 135 - 145 mmol/L   Potassium 4.1 3.5 - 5.1 mmol/L   Chloride 97 (L) 98 - 111 mmol/L   CO2 22 22 - 32 mmol/L   Glucose, Bld 149 (H) 70 - 99 mg/dL   BUN 13 6 - 20 mg/dL   Creatinine, Ser 8.65 0.44 - 1.00 mg/dL   Calcium 78.4 8.9 - 69.6 mg/dL   GFR, Estimated >29 >52 mL/min   Anion gap 8 5 - 15  CBC  Result Value Ref Range   WBC 7.9 4.0 - 10.5 K/uL   RBC 3.44 (L) 3.87 - 5.11 MIL/uL   Hemoglobin 11.2 (L) 12.0 - 15.0 g/dL   HCT 84.1 (L) 32.4 - 40.1 %   MCV 95.6 80.0 - 100.0 fL   MCH 32.6 26.0 - 34.0 pg   MCHC 34.0 30.0 - 36.0 g/dL   RDW 02.7 25.3 - 66.4 %   Platelets 264 150 - 400 K/uL   nRBC 0.0 0.0 - 0.2 %  Troponin I (High Sensitivity)  Result Value Ref Range   Troponin I (High Sensitivity) <2 <18 ng/L  Troponin I (High Sensitivity)  Result Value Ref Range   Troponin I (High Sensitivity) 3 <18 ng/L   I personally reviewed labs  Imaging: CLINICAL DATA:  Soft tissue mass with bony destruction involving the left seventh rib and T6 and T7 vertebral bodies.   EXAM: MRI CERVICAL AND THORACIC SPINE WITHOUT AND WITH CONTRAST   TECHNIQUE: Multiplanar and multiecho pulse sequences of the cervical spine, to include the craniocervical junction and cervicothoracic junction, and the thoracic spine, were obtained without and with intravenous contrast.   CONTRAST:  7mL GADAVIST GADOBUTROL 1 MMOL/ML IV SOLN   COMPARISON:  Same day CTA chest, cervical and thoracic spine MRI 07/20/2020   FINDINGS: MRI CERVICAL SPINE FINDINGS   Alignment: There is trace retrolisthesis of C3 on C4 and trace anterolisthesis of C5 on C6.   Vertebrae: Vertebral body heights are preserved. Background marrow signal is normal. A T1  hypointense/T2 hyperintense lesion in the clivus is unchanged since 2022, favored benign. There is no suspicious marrow signal abnormality or marrow edema in the cervical spine. There is no abnormal marrow enhancement in the cervical spine.   There is a 6 mm enhancing lesion in the right occipital calvarium which was not seen on the prior cervical spine MRI from 2022; however, this area may not has been included within the field of view.   Cord: Normal in signal and morphology.   Posterior Fossa, vertebral arteries, paraspinal tissues: The imaged posterior fossa is unremarkable. The vertebral artery flow voids are normal. The paraspinal soft tissues are unremarkable.   Disc levels:   There is overall mild degenerative change in the cervical spine without significant spinal canal or neural foraminal stenosis.   MRI THORACIC SPINE FINDINGS   Alignment:  Normal.   Vertebrae: Background marrow signal is normal.   There is infiltrative T1 hypointensity occupying most of the T6 and T7 vertebral bodies extending into the left pedicles and facets. There is a bulky soft tissue mass in the adjacent left paraspinal/subpleural soft tissues with destruction of the seventh rib. The bulk of the soft tissue component measures up to proximally 7.2 cm x 3.4 cm x 2.7 cm. 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 There is mild loss of vertebral body height at both levels consistent with associated pathologic fractures.   There is compression deformity of the T11 vertebral body with up to approximately 30% loss of vertebral body height anteriorly and minimal bony retropulsion. There is faint edema along the superior endplate consistent with acute to subacute  chronicity. This fracture may be pathologic.   There are probable additional lesions involving multiple additional ribs (for example 25-21, 25-24).   The above findings are new since the MRI from 2022.   Cord:  There is no cord signal abnormality or abnormal enhancement.   Paraspinal and other soft tissues: Unremarkable, aside from the bulky paraspinal tumor described above.   Disc levels:   There is a small disc protrusion at T10-T11 without significant spinal canal or neural foraminal stenosis. Otherwise, there is overall minimal background degenerative change without significant spinal canal or neural foraminal stenosis.   IMPRESSION: 1. Large soft tissue mass in the left paraspinal soft tissues at T6-T7 with destruction of the seventh rib and invasion of the T6 and T7 vertebral bodies with involvement of the left posterior elements as well as epidural tumor in the spinal canal resulting in moderate spinal canal stenosis without frank cord compression or cord edema. 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 2. Acute to subacute compression fracture of the T11 vertebral body with up to approximately 30% loss of vertebral body height and minimal bony retropulsion, possibly pathologic. 3. Small enhancing lesion in the right occipital calvarium, not definitely present on the prior cervical spine from 2022 (though possibly not included  within the field of view on that study), suspicious for an additional metastatic or myeloma lesion. 4. Additional probable lesions involving multiple additional ribs. 5. No acute or suspicious finding in the cervical spine.     Electronically Signed   By: Lesia Hausen M.D.   On: 01/24/2023 14:33   CLINICAL DATA:  Chest pain and shortness of breath for a month.   EXAM: CT ANGIOGRAPHY CHEST WITH CONTRAST   TECHNIQUE: Multidetector CT imaging of the chest was performed using  the standard protocol during bolus administration of intravenous contrast. Multiplanar CT image reconstructions and MIPs were obtained to evaluate the vascular anatomy.   RADIATION DOSE REDUCTION: This exam was performed according to the departmental dose-optimization program which includes automated exposure control, adjustment of the mA and/or kV according to patient size and/or use of iterative reconstruction technique.   CONTRAST:  75mL OMNIPAQUE IOHEXOL 350 MG/ML SOLN   COMPARISON:  X-ray 01/24/2023 and older.  Previous CT January 2017   FINDINGS: Cardiovascular: The thoracic aorta has a normal course and caliber with minimal calcified plaque. There is a bovine type aortic arch, normal variant. Coronary artery calcifications are seen. Please correlate for other coronary risk factors. Heart is nonenlarged. There is slight wall thickening of the left ventricle. Prominent fat along the intra-atrial septum. Pulmonary arteries show some slight enlargement centrally. Please correlate for any evidence of pulmonary artery hypertension. There is heterogeneous enhancement of the pulmonary arterial tree limiting evaluation for emboli. No obvious large and central embolus although several areas are nondiagnostic.   Mediastinum/Nodes: Surgical changes identified with a moderate hiatal hernia. Small thyroid gland. No specific abnormal lymph node enlargement identified in the axillary region, hilum or mediastinum. There are some small nodes identified in the posterior mediastinum, posterior to the descending thoracic aorta such as series 4, image 86 but not pathologic by size criteria.   Lungs/Pleura: There is some linear opacity lung bases likely scar or atelectasis. No consolidation. Breathing motion identified. No pleural effusion or pneumothorax.   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 Benign hepatic cysts as well.    Musculoskeletal: There is large destructive mass involving the posterior aspect of the left seventh rib with extension to involve the central canal of the spine in the associated vertebral bodies of T6 and T7. Cord compression is possible. This mass is estimated in diameter on series 4, image 65 at 8.5 by 4.3 cm. Slight extension along the extrapleural space in the posterior mediastinum as well. There is question of some subtle other lucent lesions identified along the spine but indeterminate.   Critical Value/emergent results were called by telephone at the time of interpretation on 01/24/2023 at 8:41 am to provider [facility] , who verbally acknowledged these results.   Review of the MIP images confirms the above findings.   IMPRESSION: Aggressive soft tissue mass with bone destruction involving the left seventh rib as well as the T6 and T7 vertebral bodies and central canal. A neoplastic process is possible. There also is a possibility of significant cord compression with canal encroachment along the spine.   Recommend further evaluation with spine MRI with and without contrast and a whole-body bone scan. There appears to be some other subtle lucent bone lesions along the spine and sternum.   Please correlate for any history of known malignancy including such processes as myeloma or other.   Poor opacification of the pulmonary arterial tree limiting evaluation. Several areas are non diagnostic  for pulmonary emboli. No obvious large and central embolus.   Aortic Atherosclerosis (ICD10-I70.0).     Electronically Signed   By: Karen Kays M.D.   On: 01/24/2023 11:47     I personally reviewed radiology studies to include:   Impression/Plan:   Overall the patient is a 57 year old female past medical history significant for no myeloma no previous treatment to a presented with several months of progressively worsening pleuritic chest pain exacerbated in the last week  found to have a destructive soft tissue mass at T6-7 that erodes substantial portions of the vertebral bodies of T6 and 7 extending through the posterior elements with disruption of the bone on the side with extension over and destruction of the rib heads at T7 over into the thoracic region with soft tissue mass compressing and displacing the thoracic spinal cord and encroaching within the thoracic spinal canal.  This is all concerning for neoplastic process with encroachment of the thoracic canal and destabilization of the thoracic spine potentially through disruption of the bony elements in this region. The patient is to undergo treatment however given the risk for potential destabilization of the spine as the soft tissue mass is treated preoperative stabilization of the region is likely warranted to prevent collapse of the thoracic spinal region under the necessary treatments.  1.  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  Plan Operating room Thursday with Dr. Katrinka Blazing for thoracic stabilization, please send a type and screen as well as full set of coagulation panel labs including INR PTT and PT.  TLSO brace for comfort, activity as tolerated Neurosurgery will continue to follow  Peter Garter. Madaline Brilliant, MD Neurosurgery

## 2023-01-25 NOTE — Progress Notes (Signed)
  Progress Note   Patient: Paula Garrett SWF:[id number] DOB: 19-Oct-1965 DOA: 01/24/2023     1 DOS: the patient was seen and examined on 01/25/2023   Brief hospital course: [name] is a 57 year old female with history of hypertension, depression, anxiety, history of tobacco use, GERD, who presents to the emergency department for chief concerns of chest pain and shortness of breath for 1 month.  Patient also has known multiple myeloma which has been followed as outpatient without requiring any treatment. Imaging study showed large soft tissue mass in left paraspinal soft tissue at T6-T7 with destruction of seventh rib and invasion of T6 and T7 vertebral bodies involving left posterior elements as well as epidural tumor and spinal canal with moderate spinal canal stenosis without frank cord compression or cord edema, acute/subacute compression fracture of T11 vertebral body with up to 30% vertebral body height and minimal bony retropulsion, possibly pathologic. Right occipital calvarium with small enhancing lesion, suspicious for additional metastatic or myeloma lesion. Patient has been seen by neurosurgery, started on IV steroids.  Scheduled for spinal surgery on next Tuesday.   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 Patient has been evaluated by neurosurgery as well as oncology, started on IV steroids.  Scheduled for spinal surgery on  Tuesday.  Will keep patient in the hospital, continue IV steroids.  Continue pain control.  Hypercalcemia. Hyponatremia. Calcium level has normalized after giving fluids.  Discontinue IV fluids.  Continue monitor BMP.  Essential hypertension. Continue amlodipine.    Subjective:  Patient still has some pain in the left flank, not short of breath today.  Physical Exam: Vitals:   01/24/23 1941 01/25/23 0045 01/25/23 0414 01/25/23 0821  BP: (!) 161/82 123/78 131/79 (!) 145/98  Pulse: 65 63 77 78  Resp: 16 18 20 18   Temp: 97.6 F (36.4 C) 97.9 F (36.6 C) 97.6 F (36.4 C) 98.4 F (36.9 C)  TempSrc: Oral  Oral Oral  SpO2: 94% 93% 93% 96%  Weight:      Height:       General exam: Appears calm and comfortable  Respiratory system: Clear to auscultation. Respiratory effort normal. Cardiovascular system: S1 & S2 heard, RRR. No JVD, murmurs, rubs, gallops or clicks. No pedal edema. Gastrointestinal system: Abdomen is nondistended, soft and nontender. No organomegaly or masses felt. Normal bowel sounds heard. Central nervous system: Alert and oriented. No focal neurological deficits. Extremities: Symmetric 5 x 5 power. Skin: No rashes, lesions or ulcers Psychiatry: Judgement and insight appear normal. Mood & affect appropriate.    Data Reviewed:  Lab results reviewed, MRI results reviewed.  CT scan results reviewed.  Family Communication: None  Disposition: Status is: Inpatient Remains inpatient appropriate because: Severity of disease, pending inpatient procedure.     Time spent: 35 minutes  Author: Marrion Coy, MD 01/25/2023 11:22 AM  For on call review [email or web address].

## 2023-01-25 NOTE — Progress Notes (Signed)
Cardiac monitoring discontinued per Dr Chipper Herb, MD

## 2023-01-25 NOTE — Plan of Care (Signed)

## 2023-01-25 NOTE — Consult Note (Signed)
Neurosurgery-New Consultation Evaluation 01/25/2023 Paula Garrett [id number]  Identifying Statement: Paula Garrett is a 57 y.o. female from [place] [address] with   Physician Requesting Consultation: No ref. provider found  History of Present Illness: The patient is a 57 year old right-hand female past medical history significant for previously known myeloma not having previously required treatment who presented to the emergency room with pleuritic left-sided chest pain worsening over the last several months but particularly exacerbated in the last 1 week.  She presented to the emergency room with shortness of breath and underwent CT scan angiography of the chest which demonstrated a large soft tissue mass with bony distraction involving the T6 and T7 vertebral bodies with encroachment of the central canal and compression of the spinal cord and thoracic region with extension over into the vertebral body toward the rib.  She is admitted to the oncology service for further consideration of treatment of this presumed progression of her myeloma.  Neurosurgery is consulted for further evaluation of this thoracic mass  The patient herself denies any radicular pain numbness or tingling in her lower extremities.  She is able to ambulate and actually describes that she feels less thoracic pain when she is standing upright and slightly worse when she is lying recumbent or on her side.  She denies any bowel or bladder difficulties she denies any numbness or tingling in her else in her extremities.  She notes that she has intermittent radiating spasms and pain around her chest wall around the level of T6-7 distribution    Past Medical History:  Past Medical History:  Diagnosis Date   Anxiety    Arthritis    Asthma    Barrett's esophagus 2015   COVID-19    03/10/20   Depression    GERD (gastroesophageal reflux disease)    Hypertension    Hypothyroidism    Pneumonia    Smoldering myeloma    Thyroid  disease     Social History: Social History   Socioeconomic History   Marital status: Divorced    Spouse name: Not on file   Number of children: 2   Years of education: Not on file   Highest education level: GED or equivalent  Occupational History   Occupation: Part-time  Tobacco Use   Smoking status: Every Day    Current packs/day: 1.00    Average packs/day: 1 pack/day for 42.9 years (42.9 ttl pk-yrs)    Types: Cigarettes    Start date: 03/15/1980   Smokeless tobacco: Never  Vaping Use   Vaping status: Never Used  Substance and Sexual Activity   Alcohol use: Yes    Alcohol/week: 10.0 - 12.0 standard drinks of alcohol    Types: 8 - 10 Glasses of wine, 2 Standard drinks or equivalent per week   Drug use: No   Sexual activity: Yes    Partners: Male  Other Topics Concern   Not on file  Social History Narrative   Lives in [place] single, divorced       Work - 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       Diet - healthy diet   Exercise - limited   Caffeine use: daily   Social Determinants of Health   Financial Resource Strain: Low Risk  (11/01/2022)   Overall Financial Resource Strain (CARDIA)    Difficulty of Paying Living Expenses: Not very hard  Food Insecurity: No Food Insecurity (01/24/2023)   Hunger Vital Sign    Worried About Running Out of  Food in the Last Year: Never true    Ran Out of Food in the Last Year: Never true  Transportation Needs: No Transportation Needs (01/24/2023)   PRAPARE - Administrator, Civil Service (Medical): No    Lack of Transportation (Non-Medical): No  Physical Activity: Insufficiently Active (11/01/2022)   Exercise Vital Sign    Days of Exercise per Week: 3 days    Minutes of Exercise per Session: 20 min  Stress: Stress Concern Present (11/01/2022)   [employer] of Occupational Health - Occupational Stress Questionnaire    Feeling of Stress : Very much  Social Connections: Socially Isolated (11/01/2022)   Social  Connection and Isolation Panel [NHANES]    Frequency of Communication with Friends and Family: More than three times a week    Frequency of Social Gatherings with Friends and Family: Once a week    Attends Religious Services: Never    Database administrator or Organizations: No    Attends Engineer, structural: Not on file    Marital Status: Divorced  Intimate Partner Violence: Not At Risk (01/24/2023)   Humiliation, Afraid, Rape, and Kick questionnaire    Fear of Current or Ex-Partner: No    Emotionally Abused: No    Physically Abused: No    Sexually Abused: No   Living arrangements (living alone, with partner): Lives at home  Family History: Family History  Problem Relation Age of Onset   Heart disease Mother    COPD Mother    Arthritis Mother    Cancer Mother        uterine   Lupus Mother    Anxiety disorder Mother    Depression Mother    Drug abuse Mother    COPD Father    Arthritis Father    Heart disease Father    Heart attack Father    Alcohol abuse Father    Heart disease Brother    Heart attack Brother    Drug abuse Brother    Anxiety disorder Brother    Depression Brother    Heart disease Brother    Cancer Brother        liver cancer age 78 y.o   Diabetes Maternal Grandmother    Diabetes Maternal Grandfather    Diabetes Paternal Grandmother    Diabetes Paternal Grandfather    Breast cancer Neg Hx     Review of Systems:  Review of Systems - General ROS: Negative Psychological ROS: Negative Ophthalmic ROS: Negative ENT ROS: Negative Hematological and Lymphatic ROS: Negative  Endocrine ROS: Negative Respiratory ROS: Negative Cardiovascular ROS: Negative Gastrointestinal ROS: Negative Genito-Urinary ROS: Negative Musculoskeletal ROS: Notes that she has spasming muscle pains intermittently around her rib cage on her left and side Neurological ROS: Negative Dermatological ROS: Negative  Physical Exam: BP (!) 145/98 (BP Location: Left Arm)    Pulse 78   Temp 98.4 F (36.9 C) (Oral)   Resp 18   Ht 5\' 2"  (1.575 m)   Wt 77.1 kg   SpO2 96%   BMI 31.09 kg/m  Body mass index is 31.09 kg/m. Body surface area is 1.84 meters squared. General appearance: Alert, cooperative, in no acute distress Head: Normocephalic, atraumatic Eyes: Normal, EOM intact Oropharynx: Moist without lesions Neck: Supple, no tenderness Heart: Normal, regular rate and rhythm, without murmur Lungs: Clear to auscultation, good air exchange Abdomen: Soft, nondistended Ext: No edema in LE bilaterally, good distal pulses  Neurologic exam:  Mental status: alertness: alert, orientation: person,  place, time, affect: normal Speech: fluent and clear Cranial nerves:  II: Visual fields are full by confrontation, no ptosis III/IV/VI: extra-ocular motions intact bilaterally V/VII:no evidence of facial droop or weakness and facial sensation intact VIII: hearing normal XI: trapezius strength symmetric,  sternocleidomastoid strength symmetric XII: tongue strength symmetric  Motor:strength symmetric 5/5, normal muscle mass and tone in all extremities and no pronator drift Sensory: intact to light touch in all extremities Reflexes: 2+ and symmetric bilaterally for arms and legs Coordination: intact finger to nose Gait: normal   Laboratory: Results for orders placed or performed during the hospital encounter of 01/24/23  Basic metabolic panel  Result Value Ref Range   Sodium 129 (L) 135 - 145 mmol/L   Potassium 4.0 3.5 - 5.1 mmol/L   Chloride 93 (L) 98 - 111 mmol/L   CO2 23 22 - 32 mmol/L   Glucose, Bld 100 (H) 70 - 99 mg/dL   BUN 12 6 - 20 mg/dL   Creatinine, Ser 1.61 0.44 - 1.00 mg/dL   Calcium 09.6 (H) 8.9 - 10.3 mg/dL   GFR, Estimated >04 >54 mL/min   Anion gap 13 5 - 15  CBC  Result Value Ref Range   WBC 8.1 4.0 - 10.5 K/uL   RBC 3.90 3.87 - 5.11 MIL/uL   Hemoglobin 12.6 12.0 - 15.0 g/dL   HCT 09.8 11.9 - 14.7 %   MCV 94.9 80.0 - 100.0 fL   MCH  32.3 26.0 - 34.0 pg   MCHC 34.1 30.0 - 36.0 g/dL   RDW 82.9 56.2 - 13.0 %   Platelets 299 150 - 400 K/uL   nRBC 0.0 0.0 - 0.2 %  Lactate dehydrogenase  Result Value Ref Range   LDH 67 (L) 98 - 192 U/L  Basic metabolic panel  Result Value Ref Range   Sodium 127 (L) 135 - 145 mmol/L   Potassium 4.1 3.5 - 5.1 mmol/L   Chloride 97 (L) 98 - 111 mmol/L   CO2 22 22 - 32 mmol/L   Glucose, Bld 149 (H) 70 - 99 mg/dL   BUN 13 6 - 20 mg/dL   Creatinine, Ser 8.65 0.44 - 1.00 mg/dL   Calcium 78.4 8.9 - 69.6 mg/dL   GFR, Estimated >29 >52 mL/min   Anion gap 8 5 - 15  CBC  Result Value Ref Range   WBC 7.9 4.0 - 10.5 K/uL   RBC 3.44 (L) 3.87 - 5.11 MIL/uL   Hemoglobin 11.2 (L) 12.0 - 15.0 g/dL   HCT 84.1 (L) 32.4 - 40.1 %   MCV 95.6 80.0 - 100.0 fL   MCH 32.6 26.0 - 34.0 pg   MCHC 34.0 30.0 - 36.0 g/dL   RDW 02.7 25.3 - 66.4 %   Platelets 264 150 - 400 K/uL   nRBC 0.0 0.0 - 0.2 %  Troponin I (High Sensitivity)  Result Value Ref Range   Troponin I (High Sensitivity) <2 <18 ng/L  Troponin I (High Sensitivity)  Result Value Ref Range   Troponin I (High Sensitivity) 3 <18 ng/L   I personally reviewed labs  Imaging: CLINICAL DATA:  Soft tissue mass with bony destruction involving the left seventh rib and T6 and T7 vertebral bodies.   EXAM: MRI CERVICAL AND THORACIC SPINE WITHOUT AND WITH CONTRAST   TECHNIQUE: Multiplanar and multiecho pulse sequences of the cervical spine, to include the craniocervical junction and cervicothoracic junction, and the thoracic spine, were obtained without and with intravenous contrast.   CONTRAST:  7mL GADAVIST GADOBUTROL 1 MMOL/ML IV SOLN   COMPARISON:  Same day CTA chest, cervical and thoracic spine MRI 07/20/2020   FINDINGS: MRI CERVICAL SPINE FINDINGS   Alignment: There is trace retrolisthesis of C3 on C4 and trace anterolisthesis of C5 on C6.   Vertebrae: Vertebral body heights are preserved. Background marrow signal is normal. A T1  hypointense/T2 hyperintense lesion in the clivus is unchanged since 2022, favored benign. There is no suspicious marrow signal abnormality or marrow edema in the cervical spine. There is no abnormal marrow enhancement in the cervical spine.   There is a 6 mm enhancing lesion in the right occipital calvarium which was not seen on the prior cervical spine MRI from 2022; however, this area may not has been included within the field of view.   Cord: Normal in signal and morphology.   Posterior Fossa, vertebral arteries, paraspinal tissues: The imaged posterior fossa is unremarkable. The vertebral artery flow voids are normal. The paraspinal soft tissues are unremarkable.   Disc levels:   There is overall mild degenerative change in the cervical spine without significant spinal canal or neural foraminal stenosis.   MRI THORACIC SPINE FINDINGS   Alignment:  Normal.   Vertebrae: Background marrow signal is normal.   There is infiltrative T1 hypointensity occupying most of the T6 and T7 vertebral bodies extending into the left pedicles and facets. There is a bulky soft tissue mass in the adjacent left paraspinal/subpleural soft tissues with destruction of the seventh rib. The bulk of the soft tissue component measures up to proximally 7.2 cm x 3.4 cm x 2.7 cm. 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 There is mild loss of vertebral body height at both levels consistent with associated pathologic fractures.   There is compression deformity of the T11 vertebral body with up to approximately 30% loss of vertebral body height anteriorly and minimal bony retropulsion. There is faint edema along the superior endplate consistent with acute to subacute  chronicity. This fracture may be pathologic.   There are probable additional lesions involving multiple additional ribs (for example 25-21, 25-24).   The above findings are new since the MRI from 2022.   Cord:  There is no cord signal abnormality or abnormal enhancement.   Paraspinal and other soft tissues: Unremarkable, aside from the bulky paraspinal tumor described above.   Disc levels:   There is a small disc protrusion at T10-T11 without significant spinal canal or neural foraminal stenosis. Otherwise, there is overall minimal background degenerative change without significant spinal canal or neural foraminal stenosis.   IMPRESSION: 1. Large soft tissue mass in the left paraspinal soft tissues at T6-T7 with destruction of the seventh rib and invasion of the T6 and T7 vertebral bodies with involvement of the left posterior elements as well as epidural tumor in the spinal canal resulting in moderate spinal canal stenosis without frank cord compression or cord edema. 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 2. Acute to subacute compression fracture of the T11 vertebral body with up to approximately 30% loss of vertebral body height and minimal bony retropulsion, possibly pathologic. 3. Small enhancing lesion in the right occipital calvarium, not definitely present on the prior cervical spine from 2022 (though possibly not included  within the field of view on that study), suspicious for an additional metastatic or myeloma lesion. 4. Additional probable lesions involving multiple additional ribs. 5. No acute or suspicious finding in the cervical spine.     Electronically Signed   By: Lesia Hausen M.D.   On: 01/24/2023 14:33   CLINICAL DATA:  Chest pain and shortness of breath for a month.   EXAM: CT ANGIOGRAPHY CHEST WITH CONTRAST   TECHNIQUE: Multidetector CT imaging of the chest was performed using  the standard protocol during bolus administration of intravenous contrast. Multiplanar CT image reconstructions and MIPs were obtained to evaluate the vascular anatomy.   RADIATION DOSE REDUCTION: This exam was performed according to the departmental dose-optimization program which includes automated exposure control, adjustment of the mA and/or kV according to patient size and/or use of iterative reconstruction technique.   CONTRAST:  75mL OMNIPAQUE IOHEXOL 350 MG/ML SOLN   COMPARISON:  X-ray 01/24/2023 and older.  Previous CT January 2017   FINDINGS: Cardiovascular: The thoracic aorta has a normal course and caliber with minimal calcified plaque. There is a bovine type aortic arch, normal variant. Coronary artery calcifications are seen. Please correlate for other coronary risk factors. Heart is nonenlarged. There is slight wall thickening of the left ventricle. Prominent fat along the intra-atrial septum. Pulmonary arteries show some slight enlargement centrally. Please correlate for any evidence of pulmonary artery hypertension. There is heterogeneous enhancement of the pulmonary arterial tree limiting evaluation for emboli. No obvious large and central embolus although several areas are nondiagnostic.   Mediastinum/Nodes: Surgical changes identified with a moderate hiatal hernia. Small thyroid gland. No specific abnormal lymph node enlargement identified in the axillary region, hilum or mediastinum. There are some small nodes identified in the posterior mediastinum, posterior to the descending thoracic aorta such as series 4, image 86 but not pathologic by size criteria.   Lungs/Pleura: There is some linear opacity lung bases likely scar or atelectasis. No consolidation. Breathing motion identified. No pleural effusion or pneumothorax.   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 Benign hepatic cysts as well.    Musculoskeletal: There is large destructive mass involving the posterior aspect of the left seventh rib with extension to involve the central canal of the spine in the associated vertebral bodies of T6 and T7. Cord compression is possible. This mass is estimated in diameter on series 4, image 65 at 8.5 by 4.3 cm. Slight extension along the extrapleural space in the posterior mediastinum as well. There is question of some subtle other lucent lesions identified along the spine but indeterminate.   Critical Value/emergent results were called by telephone at the time of interpretation on 01/24/2023 at 8:41 am to provider [facility] , who verbally acknowledged these results.   Review of the MIP images confirms the above findings.   IMPRESSION: Aggressive soft tissue mass with bone destruction involving the left seventh rib as well as the T6 and T7 vertebral bodies and central canal. A neoplastic process is possible. There also is a possibility of significant cord compression with canal encroachment along the spine.   Recommend further evaluation with spine MRI with and without contrast and a whole-body bone scan. There appears to be some other subtle lucent bone lesions along the spine and sternum.   Please correlate for any history of known malignancy including such processes as myeloma or other.   Poor opacification of the pulmonary arterial tree limiting evaluation. Several areas are non diagnostic  for pulmonary emboli. No obvious large and central embolus.   Aortic Atherosclerosis (ICD10-I70.0).     Electronically Signed   By: Karen Kays M.D.   On: 01/24/2023 11:47     I personally reviewed radiology studies to include:   Impression/Plan:   Overall the patient is a 57 year old female past medical history significant for no myeloma no previous treatment to a presented with several months of progressively worsening pleuritic chest pain exacerbated in the last week  found to have a destructive soft tissue mass at T6-7 that erodes substantial portions of the vertebral bodies of T6 and 7 extending through the posterior elements with disruption of the bone on the side with extension over and destruction of the rib heads at T7 over into the thoracic region with soft tissue mass compressing and displacing the thoracic spinal cord and encroaching within the thoracic spinal canal.  This is all concerning for neoplastic process with encroachment of the thoracic canal and destabilization of the thoracic spine potentially through disruption of the bony elements in this region. The patient is to undergo treatment however given the risk for potential destabilization of the spine as the soft tissue mass is treated preoperative stabilization of the region is likely warranted to prevent collapse of the thoracic spinal region under the necessary treatments.  1.  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  Plan Operating room Thursday with Dr. Katrinka Blazing for thoracic stabilization, please send a type and screen as well as full set of coagulation panel labs including INR PTT and PT.  TLSO brace for comfort, activity as tolerated Neurosurgery will continue to follow  Peter Garter. Madaline Brilliant, MD Neurosurgery

## 2023-01-26 DIAGNOSIS — G9589 Other specified diseases of spinal cord: Secondary | ICD-10-CM | POA: Diagnosis not present

## 2023-01-26 DIAGNOSIS — C9000 Multiple myeloma not having achieved remission: Secondary | ICD-10-CM | POA: Diagnosis not present

## 2023-01-26 LAB — BASIC METABOLIC PANEL WITH GFR
Anion gap: 11 (ref 5–15)
BUN: 12 mg/dL (ref 6–20)
CO2: 23 mmol/L (ref 22–32)
Calcium: 9.8 mg/dL (ref 8.9–10.3)
Chloride: 97 mmol/L — ABNORMAL LOW (ref 98–111)
Creatinine, Ser: 0.7 mg/dL (ref 0.44–1.00)
GFR, Estimated: >60 mL/min (ref >60)
Glucose, Bld: 121 mg/dL — ABNORMAL HIGH (ref 70–99)
Potassium: 4.1 mmol/L (ref 3.5–5.1)
Sodium: 131 mmol/L — ABNORMAL LOW (ref 135–145)

## 2023-01-26 MED ORDER — LACTULOSE 10 GM/15ML PO SOLN
20.0000 g | Freq: Once | ORAL | Status: AC
  Administered 2023-01-26: 20 g via ORAL
  Filled 2023-01-26: qty 30

## 2023-01-26 NOTE — Progress Notes (Signed)
Hematology/Oncology Consult note [facility]  [phone] [phone]  Patient Care Team: Smitty Cords, DO as PCP - General (Family Medicine) Lemar Livings Merrily Pew, MD (General Surgery) Shelia Media, MD (Internal Medicine) Creig Hines, MD as Consulting Physician (Oncology)   Name of the patient: Banita Credeur  [id number]  59-Dec-1967   Date of visit: 01/26/2023   Interval history-pain is better controlled today with as needed Percocet.  She has not had a good bowel movement for the last 5 days.  ECOG PS- 1 Pain scale- 4 Opioid associated constipation- yes  Review of systems- Review of Systems  Constitutional:  Negative for chills, fever, malaise/fatigue and weight loss.  HENT:  Negative for congestion, ear discharge and nosebleeds.   Eyes:  Negative for blurred vision.  Respiratory:  Negative for cough, hemoptysis, sputum production, shortness of breath and wheezing.   Cardiovascular:  Negative for chest pain, palpitations, orthopnea and claudication.  Gastrointestinal:  Positive for constipation. Negative for abdominal pain, blood in stool, diarrhea, heartburn, melena, nausea and vomiting.  Genitourinary:  Negative for dysuria, flank pain, frequency, hematuria and urgency.  Musculoskeletal:  Negative for back pain, joint pain and myalgias.       Left chest wall pain radiating to the back  Skin:  Negative for rash.  Neurological:  Negative for dizziness, tingling, focal weakness, seizures, weakness and headaches.  Endo/Heme/Allergies:  Does not bruise/bleed easily.  Psychiatric/Behavioral:  Negative for depression and suicidal ideas. The patient does not have insomnia.       Allergies  Allergen Reactions   Hctz [Hydrochlorothiazide]     Hyponatremia      Past Medical History:  Diagnosis Date   Anxiety    Arthritis    Asthma    Barrett's esophagus 2015   COVID-19    03/10/20   Depression    GERD  (gastroesophageal reflux disease)    Hypertension    Hypothyroidism    Pneumonia    Smoldering myeloma    Thyroid disease      Past Surgical History:  Procedure Laterality Date   ABLATION  2006   BREAST CYST ASPIRATION Left 1998   BREAST SURGERY     cyst aspiration   BREAST SURGERY  1998   cyst aspiration   COLONOSCOPY  03/2014   COLONOSCOPY WITH PROPOFOL N/A 01/06/2020   Procedure: COLONOSCOPY WITH PROPOFOL;  Surgeon: Toledo, Boykin Nearing, MD;  Location: [facility] ENDOSCOPY;  Service: Gastroenterology;  Laterality: N/A;   ENDOMETRIAL ABLATION     ESOPHAGOGASTRODUODENOSCOPY (EGD) WITH PROPOFOL N/A 08/15/2015   Procedure: ESOPHAGOGASTRODUODENOSCOPY (EGD) WITH PROPOFOL;  Surgeon: Christena Deem, MD;  Location: [facility] ENDOSCOPY;  Service: Endoscopy;  Laterality: N/A;   ESOPHAGOGASTRODUODENOSCOPY (EGD) WITH PROPOFOL N/A 01/22/2016   Procedure: ESOPHAGOGASTRODUODENOSCOPY (EGD) WITH PROPOFOL;  Surgeon: Christena Deem, MD;  Location: [facility] ENDOSCOPY;  Service: Endoscopy;  Laterality: N/A;   ESOPHAGOGASTRODUODENOSCOPY (EGD) WITH PROPOFOL N/A 01/06/2020   Procedure: ESOPHAGOGASTRODUODENOSCOPY (EGD) WITH PROPOFOL;  Surgeon: Toledo, Boykin Nearing, MD;  Location: [facility] ENDOSCOPY;  Service: Gastroenterology;  Laterality: N/A;   GASTRIC BYPASS  2005   HERNIA REPAIR     NASAL SINUS SURGERY     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   TOTAL THYROIDECTOMY     TUBAL LIGATION      Social History   Socioeconomic History   Marital status: Divorced    Spouse name: Not on file   Number of children: 2   Years  of education: Not on file   Highest education level: GED or equivalent  Occupational History   Occupation: Part-time  Tobacco Use   Smoking status: Every Day    Current packs/day: 1.00    Average packs/day: 1 pack/day for 42.9 years (42.9 ttl pk-yrs)    Types: Cigarettes    Start date: 03/15/1980   Smokeless tobacco: Never  Vaping Use   Vaping status: Never Used  Substance and Sexual Activity    Alcohol use: Yes    Alcohol/week: 10.0 - 12.0 standard drinks of alcohol    Types: 8 - 10 Glasses of wine, 2 Standard drinks or equivalent per week   Drug use: No   Sexual activity: Yes    Partners: Male  Other Topics Concern   Not on file  Social History Narrative   Lives in [place] single, divorced       Work - 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       Diet - healthy diet   Exercise - limited   Caffeine use: daily   Social Determinants of Health   Financial Resource Strain: Low Risk  (11/01/2022)   Overall Financial Resource Strain (CARDIA)    Difficulty of Paying Living Expenses: Not very hard  Food Insecurity: No Food Insecurity (01/24/2023)   Hunger Vital Sign    Worried About Running Out of Food in the Last Year: Never true    Ran Out of Food in the Last Year: Never true  Transportation Needs: No Transportation Needs (01/24/2023)   PRAPARE - Administrator, Civil Service (Medical): No    Lack of Transportation (Non-Medical): No  Physical Activity: Insufficiently Active (11/01/2022)   Exercise Vital Sign    Days of Exercise per Week: 3 days    Minutes of Exercise per Session: 20 min  Stress: Stress Concern Present (11/01/2022)   [employer] of Occupational Health - Occupational Stress Questionnaire    Feeling of Stress : Very much  Social Connections: Socially Isolated (11/01/2022)   Social Connection and Isolation Panel [NHANES]    Frequency of Communication with Friends and Family: More than three times a week    Frequency of Social Gatherings with Friends and Family: Once a week    Attends Religious Services: Never    Database administrator or Organizations: No    Attends Engineer, structural: Not on file    Marital Status: Divorced  Intimate Partner Violence: Not At Risk (01/24/2023)   Humiliation, Afraid, Rape, and Kick questionnaire    Fear of Current or Ex-Partner: No    Emotionally Abused: No    Physically Abused: No    Sexually  Abused: No    Family History  Problem Relation Age of Onset   Heart disease Mother    COPD Mother    Arthritis Mother    Cancer Mother        uterine   Lupus Mother    Anxiety disorder Mother    Depression Mother    Drug abuse Mother    COPD Father    Arthritis Father    Heart disease Father    Heart attack Father    Alcohol abuse Father    Heart disease Brother    Heart attack Brother    Drug abuse Brother    Anxiety disorder Brother    Depression Brother    Heart disease Brother    Cancer Brother        liver cancer age 9  y.o   Diabetes Maternal Grandmother    Diabetes Maternal Grandfather    Diabetes Paternal Grandmother    Diabetes Paternal Grandfather    Breast cancer Neg Hx      Current Facility-Administered Medications:    acetaminophen (TYLENOL) tablet 650 mg, 650 mg, Oral, Q6H PRN, 650 mg at 01/26/23 1654 **OR** acetaminophen (TYLENOL) suppository 650 mg, 650 mg, Rectal, Q6H PRN, Cox, Amy N, DO   amLODipine (NORVASC) tablet 10 mg, 10 mg, Oral, Daily, Cox, Amy N, DO, 10 mg at 01/26/23 1039   clonazePAM (KLONOPIN) tablet 1 mg, 1 mg, Oral, BID, Cox, Amy N, DO, 1 mg at 01/26/23 1039   cyclobenzaprine (FLEXERIL) tablet 7.5 mg, 7.5 mg, Oral, TID PRN, Cox, Amy N, DO, 7.5 mg at 01/25/23 0047   dexamethasone (DECADRON) injection 8 mg, 8 mg, Intravenous, Q12H, Creig Hines, MD, 8 mg at 01/26/23 1039   escitalopram (LEXAPRO) tablet 10 mg, 10 mg, Oral, Daily, Cox, Amy N, DO, 10 mg at 01/26/23 1039   heparin injection 5,000 Units, 5,000 Units, Subcutaneous, Q8H, Cox, Amy N, DO, 5,000 Units at 01/26/23 1655   ipratropium-albuterol (DUONEB) 0.5-2.5 (3) MG/3ML nebulizer solution 3 mL, 3 mL, Nebulization, Q6H PRN, Cox, Amy N, DO   irbesartan (AVAPRO) tablet 300 mg, 300 mg, Oral, Daily, Cox, Amy N, DO, 300 mg at 01/26/23 1039   levothyroxine (SYNTHROID) tablet 250 mcg, 250 mcg, Oral, QAC breakfast, Cox, Amy N, DO, 250 mcg at 01/26/23 0516   mometasone-formoterol (DULERA) 200-5  MCG/ACT inhaler 2 puff, 2 puff, Inhalation, BID, Coulter, Carolyn, RPH, 2 puff at 01/26/23 1059   nicotine (NICODERM CQ - dosed in mg/24 hours) patch 21 mg, 21 mg, Transdermal, Daily PRN, Cox, Amy N, DO, 21 mg at 01/26/23 0413   ondansetron (ZOFRAN) tablet 4 mg, 4 mg, Oral, Q6H PRN **OR** ondansetron (ZOFRAN) injection 4 mg, 4 mg, Intravenous, Q6H PRN, Cox, Amy N, DO   oxyCODONE-acetaminophen (PERCOCET/ROXICET) 5-325 MG per tablet 1 tablet, 1 tablet, Oral, Q4H PRN, Marrion Coy, MD, 1 tablet at 01/26/23 1655   pantoprazole (PROTONIX) EC tablet 40 mg, 40 mg, Oral, BID AC, Cox, Amy N, DO, 40 mg at 01/26/23 1655   polyethylene glycol (MIRALAX / GLYCOLAX) packet 17 g, 17 g, Oral, BID PRN, Cox, Amy N, DO, 17 g at 01/24/23 1718   senna-docusate (Senokot-S) tablet 1 tablet, 1 tablet, Oral, BID, Cox, Amy N, DO, 1 tablet at 01/26/23 1039   umeclidinium bromide (INCRUSE ELLIPTA) 62.5 MCG/ACT 1 puff, 1 puff, Inhalation, Daily, Coulter, Carolyn, RPH, 1 puff at 01/26/23 1100   Vitamin D (Ergocalciferol) (DRISDOL) 1.25 MG (50000 UNIT) capsule 50,000 Units, 50,000 Units, Oral, Q7 days, Cox, Amy N, DO, 50,000 Units at 01/25/23 1051   zolpidem (AMBIEN) tablet 5 mg, 5 mg, Oral, QHS PRN, Cox, Amy N, DO, 5 mg at 01/25/23 2218  Physical exam:  Vitals:   01/25/23 2028 01/26/23 0505 01/26/23 0939 01/26/23 1627  BP: 135/81 123/79 (!) 153/87 (!) 146/86  Pulse: 73 72 92 83  Resp: 16 18 17 16   Temp: 97.9 F (36.6 C) (!) 97.5 F (36.4 C) (!) 97.4 F (36.3 C) 98.1 F (36.7 C)  TempSrc: Oral     SpO2: 95% 94% 100% 95%  Weight:      Height:       Physical Exam Cardiovascular:     Rate and Rhythm: Normal rate and regular rhythm.     Heart sounds: Normal heart sounds.  Pulmonary:     Effort:  Pulmonary effort is normal.     Breath sounds: Normal breath sounds.  Abdominal:     General: Bowel sounds are normal.     Palpations: Abdomen is soft.  Skin:    General: Skin is warm and dry.  Neurological:     General:  No focal deficit present.     Mental Status: She is alert and oriented to person, place, and time.         Latest Ref Rng & Units 01/26/2023    4:01 AM  CMP  Glucose 70 - 99 mg/dL 962   BUN 6 - 20 mg/dL 12   Creatinine 9.52 - 1.00 mg/dL 8.41   Sodium 324 - 401 mmol/L 131   Potassium 3.5 - 5.1 mmol/L 4.1   Chloride 98 - 111 mmol/L 97   CO2 22 - 32 mmol/L 23   Calcium 8.9 - 10.3 mg/dL 9.8       Latest Ref Rng & Units 01/25/2023    4:52 AM  CBC  WBC 4.0 - 10.5 K/uL 7.9   Hemoglobin 12.0 - 15.0 g/dL 02.7   Hematocrit 25.3 - 46.0 % 32.9   Platelets 150 - 400 K/uL 264     @IMAGES @  MR THORACIC SPINE W WO CONTRAST  Result Date: 01/24/2023 CLINICAL DATA:  Soft tissue mass with bony destruction involving the left seventh rib and T6 and T7 vertebral bodies. EXAM: MRI CERVICAL AND THORACIC SPINE WITHOUT AND WITH CONTRAST TECHNIQUE: Multiplanar and multiecho pulse sequences of the cervical spine, to include the craniocervical junction and cervicothoracic junction, and the thoracic spine, were obtained without and with intravenous contrast. CONTRAST:  7mL GADAVIST GADOBUTROL 1 MMOL/ML IV SOLN COMPARISON:  Same day CTA chest, cervical and thoracic spine MRI 07/20/2020 FINDINGS: MRI CERVICAL SPINE FINDINGS Alignment: There is trace retrolisthesis of C3 on C4 and trace anterolisthesis of C5 on C6. Vertebrae: Vertebral body heights are preserved. Background marrow signal is normal. A T1 hypointense/T2 hyperintense lesion in the clivus is unchanged since 2022, favored benign. There is no suspicious marrow signal abnormality or marrow edema in the cervical spine. There is no abnormal marrow enhancement in the cervical spine. There is a 6 mm enhancing lesion in the right occipital calvarium which was not seen on the prior cervical spine MRI from 2022; however, this area may not has been included within the field of view. Cord: Normal in signal and morphology. Posterior Fossa, vertebral arteries, paraspinal  tissues: The imaged posterior fossa is unremarkable. The vertebral artery flow voids are normal. The paraspinal soft tissues are unremarkable. Disc levels: There is overall mild degenerative change in the cervical spine without significant spinal canal or neural foraminal stenosis. MRI THORACIC SPINE FINDINGS Alignment:  Normal. Vertebrae: Background marrow signal is normal. There is infiltrative T1 hypointensity occupying most of the T6 and T7 vertebral bodies extending into the left pedicles and facets. There is a bulky soft tissue mass in the adjacent left paraspinal/subpleural soft tissues with destruction of the seventh rib. The bulk of the soft tissue component measures up to proximally 7.2 cm x 3.4 cm x 2.7 cm. 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 There is mild loss of vertebral body height at both levels consistent with associated pathologic fractures. There is compression deformity  of the T11 vertebral body with up to approximately 30% loss of vertebral body height anteriorly and minimal bony retropulsion. There is faint edema along the superior endplate consistent with acute to subacute chronicity. This fracture may be pathologic. There are probable additional lesions involving multiple additional ribs (for example 25-21, 25-24). The above findings are new since the MRI from 2022. Cord:  There is no cord signal abnormality or abnormal enhancement. Paraspinal and other soft tissues: Unremarkable, aside from the bulky paraspinal tumor described above. Disc levels: There is a small disc protrusion at T10-T11 without significant spinal canal or neural foraminal stenosis. Otherwise, there is overall minimal background degenerative change without significant spinal canal or  neural foraminal stenosis. IMPRESSION: 1. Large soft tissue mass in the left paraspinal soft tissues at T6-T7 with destruction of the seventh rib and invasion of the T6 and T7 vertebral bodies with involvement of the left posterior elements as well as epidural tumor in the spinal canal resulting in moderate spinal canal stenosis without frank cord compression or cord edema. 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 2. Acute to subacute compression fracture of the T11 vertebral body with up to approximately 30% loss of vertebral body height and minimal bony retropulsion, possibly pathologic. 3. Small enhancing lesion in the right occipital calvarium, not definitely present on the prior cervical spine from 2022 (though possibly not included within the field of view on that study), suspicious for an additional metastatic or myeloma lesion. 4. Additional probable lesions involving multiple additional ribs. 5. No acute or suspicious finding in the cervical spine. Electronically Signed   By: Lesia Hausen M.D.   On: 01/24/2023 14:33   MR Cervical Spine W and Wo Contrast  Result Date: 01/24/2023 CLINICAL DATA:  Soft tissue mass with bony destruction involving the left seventh rib and T6 and T7 vertebral bodies. EXAM: MRI CERVICAL AND THORACIC SPINE WITHOUT AND WITH CONTRAST TECHNIQUE: Multiplanar and multiecho pulse sequences of the cervical spine, to include the craniocervical junction and cervicothoracic junction, and the thoracic spine, were obtained without and with intravenous contrast. CONTRAST:  7mL GADAVIST GADOBUTROL 1 MMOL/ML IV SOLN COMPARISON:  Same day CTA chest, cervical and thoracic spine MRI 07/20/2020 FINDINGS: MRI CERVICAL SPINE FINDINGS Alignment: There is trace retrolisthesis of C3 on C4 and trace anterolisthesis of C5 on C6. Vertebrae: Vertebral body heights are preserved. Background marrow signal is normal. A T1 hypointense/T2 hyperintense  lesion in the clivus is unchanged since 2022, favored benign. There is no suspicious marrow signal abnormality or marrow edema in the cervical spine. There is no abnormal marrow enhancement in the cervical spine. There is a 6 mm enhancing lesion in the right occipital calvarium which was not seen on the prior cervical spine MRI from 2022; however, this area may not has been included within the field of view. Cord: Normal in signal and morphology. Posterior Fossa, vertebral arteries, paraspinal tissues: The imaged posterior fossa is unremarkable. The vertebral artery flow voids are normal. The paraspinal soft tissues are unremarkable. Disc levels: There is overall mild degenerative change in the cervical spine without significant spinal canal or neural foraminal stenosis. MRI THORACIC SPINE FINDINGS Alignment:  Normal. Vertebrae: Background marrow signal is normal. There is infiltrative T1 hypointensity occupying most of the T6 and T7 vertebral bodies extending into the left pedicles and facets. There is a bulky soft tissue mass in the adjacent left paraspinal/subpleural soft tissues with destruction of the seventh rib. The bulk of the soft tissue  component measures up to proximally 7.2 cm x 3.4 cm x 2.7 cm. 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 There is mild loss of vertebral body height at both levels consistent with associated pathologic fractures. There is compression deformity of the T11 vertebral body with up to approximately 30% loss of vertebral body height anteriorly and minimal bony retropulsion. There is faint edema along the superior endplate consistent with acute to subacute chronicity. This fracture may be pathologic. There are probable additional lesions involving  multiple additional ribs (for example 25-21, 25-24). The above findings are new since the MRI from 2022. Cord:  There is no cord signal abnormality or abnormal enhancement. Paraspinal and other soft tissues: Unremarkable, aside from the bulky paraspinal tumor described above. Disc levels: There is a small disc protrusion at T10-T11 without significant spinal canal or neural foraminal stenosis. Otherwise, there is overall minimal background degenerative change without significant spinal canal or neural foraminal stenosis. IMPRESSION: 1. Large soft tissue mass in the left paraspinal soft tissues at T6-T7 with destruction of the seventh rib and invasion of the T6 and T7 vertebral bodies with involvement of the left posterior elements as well as epidural tumor in the spinal canal resulting in moderate spinal canal stenosis without frank cord compression or cord edema. 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 2. Acute to subacute compression fracture of the T11 vertebral body with up to approximately 30% loss of vertebral body height and minimal bony retropulsion, possibly pathologic. 3. Small enhancing lesion in the right occipital calvarium, not definitely present on the prior cervical spine from 2022 (though possibly not included within the field of view on that study), suspicious for an additional metastatic or myeloma lesion. 4. Additional probable lesions involving multiple additional ribs. 5. No acute or suspicious finding in the cervical spine. Electronically Signed   By: Lesia Hausen M.D.   On: 01/24/2023 14:33   CT Angio Chest PE W and/or Wo Contrast  Result Date: 01/24/2023 CLINICAL DATA:  Chest pain and shortness of breath for a month. EXAM: CT ANGIOGRAPHY CHEST WITH CONTRAST TECHNIQUE: Multidetector CT imaging of the chest was performed using the standard protocol during bolus administration of intravenous contrast. Multiplanar CT image  reconstructions and MIPs were obtained to evaluate the vascular anatomy. RADIATION DOSE REDUCTION: This exam was performed according to the departmental dose-optimization program which includes automated exposure control, adjustment of the mA and/or kV according to patient size and/or use of iterative reconstruction technique. CONTRAST:  75mL OMNIPAQUE IOHEXOL 350 MG/ML SOLN COMPARISON:  X-ray 01/24/2023 and older.  Previous CT January 2017 FINDINGS: Cardiovascular: The thoracic aorta has a normal course and caliber with minimal calcified plaque. There is a bovine type aortic arch, normal variant. Coronary artery calcifications are seen. Please correlate for other coronary risk factors. Heart is nonenlarged. There is slight wall thickening of the left ventricle. Prominent fat along the intra-atrial septum. Pulmonary arteries show some slight enlargement centrally. Please correlate for any evidence of pulmonary artery hypertension. There is heterogeneous enhancement of the pulmonary arterial tree limiting evaluation for emboli. No obvious large and central embolus although several areas are nondiagnostic. Mediastinum/Nodes: Surgical changes identified with a moderate hiatal hernia. Small thyroid gland. No specific abnormal lymph node enlargement identified in  the axillary region, hilum or mediastinum. There are some small nodes identified in the posterior mediastinum, posterior to the descending thoracic aorta such as series 4, image 86 but not pathologic by size criteria. Lungs/Pleura: There is some linear opacity lung bases likely scar or atelectasis. No consolidation. Breathing motion identified. No pleural effusion or pneumothorax. 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 Benign hepatic cysts as well. Musculoskeletal: There is large destructive mass involving the posterior aspect of the left seventh rib with extension to involve the central canal of the  spine in the associated vertebral bodies of T6 and T7. Cord compression is possible. This mass is estimated in diameter on series 4, image 65 at 8.5 by 4.3 cm. Slight extension along the extrapleural space in the posterior mediastinum as well. There is question of some subtle other lucent lesions identified along the spine but indeterminate. Critical Value/emergent results were called by telephone at the time of interpretation on 01/24/2023 at 8:41 am to provider [facility] , who verbally acknowledged these results. Review of the MIP images confirms the above findings. IMPRESSION: Aggressive soft tissue mass with bone destruction involving the left seventh rib as well as the T6 and T7 vertebral bodies and central canal. A neoplastic process is possible. There also is a possibility of significant cord compression with canal encroachment along the spine. Recommend further evaluation with spine MRI with and without contrast and a whole-body bone scan. There appears to be some other subtle lucent bone lesions along the spine and sternum. Please correlate for any history of known malignancy including such processes as myeloma or other. Poor opacification of the pulmonary arterial tree limiting evaluation. Several areas are non diagnostic for pulmonary emboli. No obvious large and central embolus. Aortic Atherosclerosis (ICD10-I70.0). Electronically Signed   By: Karen Kays M.D.   On: 01/24/2023 11:47   DG Chest 2 View  Result Date: 01/24/2023 CLINICAL DATA:  Chest pain EXAM: CHEST - 2 VIEW COMPARISON:  Chest x-ray dated January 07, 2023 FINDINGS: The heart size and mediastinal contours are within normal limits. Small hiatal hernia. Both lungs are clear. Pleural-based nodular opacity of the left posterior hemithorax. The visualized skeletal structures are unremarkable. IMPRESSION: Pleural-based nodular opacity of the left posterior hemithorax. Recommend further evaluation with contrast enhanced chest CT. Electronically  Signed   By: Allegra Lai M.D.   On: 01/24/2023 09:38   DG Ribs Unilateral Left  Result Date: 01/09/2023 CLINICAL DATA:  Fall, left rib pain. EXAM: LEFT RIBS - 2 VIEW COMPARISON:  Chest radiograph 11/01/2022 FINDINGS: The cardiomediastinal silhouette is stable and within normal limits. Linear opacities in the left midlung likely reflect atelectasis or scar. There is no other focal airspace opacity. There is no pulmonary edema. There is no pleural effusion or pneumothorax There is no displaced rib fracture or other acute osseous abnormality. IMPRESSION: No displaced rib fracture or other acute osseous abnormality identified. Electronically Signed   By: Lesia Hausen M.D.   On: 01/09/2023 17:13   DG Lumbar Spine Complete  Result Date: 01/09/2023 CLINICAL DATA:  Trauma, fall, pain EXAM: LUMBAR SPINE - COMPLETE 4+ VIEW COMPARISON:  12/24/2018 FINDINGS: No recent fracture is seen. Alignment of posterior margins of vertebral bodies appears normal. Small bony spurs are noted. Facet hypertrophy is seen, more so at L5-S1 level. Arterial calcifications are seen in aorta. IMPRESSION: No recent fracture is seen. Degenerative changes are noted, more so in facet joints in lower lumbar spine. Aortic arteriosclerosis.  Electronically Signed   By: Ernie Avena M.D.   On: 01/09/2023 17:12     Assessment and plan- Patient is a 57 y.o. 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  We will be getting tissue diagnosis at the time of surgery.  Continue 8 mg IV every 12 Decadron and following surgery she can have a slow steroid taper as an outpatient for 2 weeks.  I have ordered myeloma panel and serum free light chains as well as 24-hour urine protein electrophoresis all of  which are pending.  She will need a PET scan and bone marrow biopsy as an outpatient.  Roughly 2 to 3 weeks after surgery and healing is completed she will need to start systemic treatment for multiple myeloma if surgery confirms the diagnosis.  Neoplasm related pain: Continue as needed Percocet.  She may need a long-acting pain medicine as an outpatient and we will continue to follow  Opioid associated constipation: Continue MiraLAX and senna.  She was given 1 dose of lactulose today and she may need it more frequently if she does not have a bowel movement within the next 24 hours.  I will also have palliative care see her tomorrow to address her pain and constipation   Visit Diagnosis 1. Spinal cord mass (HCC)      Dr. Owens Shark, MD, MPH [facility] at [facility] [phone] 01/26/2023 9:12 PM

## 2023-01-26 NOTE — Progress Notes (Signed)
Progress Note   Patient: BRESHAWN CITRANO XBJ:[id number] DOB: Jun 14, 1966 DOA: 01/24/2023     2 DOS: the patient was seen and examined on 01/26/2023   Brief hospital course: Ms. Tannia Baragan is a 57 year old female with history of hypertension, depression, anxiety, history of tobacco use, GERD, who presents to the emergency department for chief concerns of chest pain and shortness of breath for 1 month.  Patient also has known multiple myeloma which has been followed as outpatient without requiring any treatment. Imaging study showed large soft tissue mass in left paraspinal soft tissue at T6-T7 with destruction of seventh rib and invasion of T6 and T7 vertebral bodies involving left posterior elements as well as epidural tumor and spinal canal with moderate spinal canal stenosis without frank cord compression or cord edema, acute/subacute compression fracture of T11 vertebral body with up to 30% vertebral body height and minimal bony retropulsion, possibly pathologic. Right occipital calvarium with small enhancing lesion, suspicious for additional metastatic or myeloma lesion. Patient has been seen by neurosurgery, started on IV steroids.  Scheduled for spinal surgery on next Tuesday.   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 Patient has been evaluated by neurosurgery as well as oncology, started on IV steroids.  Scheduled for spinal surgery on  Tuesday.  Will keep patient in the hospital, continue IV steroids.   Pain medicine changed to Percocet, patient doing well.  Patient has some constipation, last bowel movement 5 days ago.  Will give lactulose.   Hypercalcemia. Hyponatremia. Calcium level has normalized after giving fluids.  Sodium level also improving.   Essential hypertension. Continue amlodipine.      Subjective:  Patient doing better, pain under control.  Complaining of constipation without abdominal pain or nausea vomiting.  Physical Exam: Vitals:   01/25/23 1547 01/25/23 2028 01/26/23 0505 01/26/23 0939  BP: 126/80 135/81 123/79 (!) 153/87  Pulse: 77 73 72 92  Resp: 16 16 18 17   Temp: 97.6 F (36.4 C) 97.9 F (36.6 C) (!) 97.5 F (36.4 C) (!) 97.4 F (36.3 C)  TempSrc: Oral Oral    SpO2: 92% 95% 94% 100%  Weight:      Height:       General exam: Appears calm and comfortable  Respiratory system: Clear to auscultation. Respiratory effort normal. Cardiovascular system: S1 & S2 heard, RRR. No JVD, murmurs, rubs, gallops or clicks. No pedal edema. Gastrointestinal system: Abdomen is nondistended, soft and nontender. No organomegaly or masses felt. Normal bowel sounds heard. Central nervous system: Alert and oriented. No focal neurological deficits. Extremities: Symmetric 5 x 5 power. Skin: No rashes, lesions or ulcers Psychiatry: Judgement and insight appear normal. Mood & affect appropriate.    Data Reviewed:  Lab results reviewed.  Family Communication: None  Disposition: Status is: Inpatient Remains inpatient appropriate because: Severity of disease, inpatient procedure.     Time spent: 35 minutes  Author: Marrion Coy, MD 01/26/2023  9:52 AM  For on call review [email or web address].

## 2023-01-26 NOTE — Plan of Care (Signed)

## 2023-01-26 NOTE — Progress Notes (Signed)
Neurosurgery visit note Patient seen and examined.  She is tearful in the bedside right now having just getting her medications for her back pain otherwise sitting up in the bed crosslegged.  She notes that she feels better when she stands up out of bed and the pain eases slightly. Neurologic exam she is awake alert and appropriate cranial nerves for gross infection was a 4 extremities grossly strength sensation  AP: Overall neurologic exam is reassuring.  For pain she could try a TLSO brace for comfort as needed Continue to mobilize as you are doing.  Plan for surgery with Dr. Katrinka Blazing Tuesday for stabilization of the thoracic spine.  Peter Garter. Madaline Brilliant, MD Neurosurgery

## 2023-01-27 ENCOUNTER — Inpatient Hospital Stay: Payer: Managed Care, Other (non HMO)

## 2023-01-27 ENCOUNTER — Telehealth: Payer: Self-pay

## 2023-01-27 DIAGNOSIS — Z515 Encounter for palliative care: Secondary | ICD-10-CM

## 2023-01-27 DIAGNOSIS — K5903 Drug induced constipation: Secondary | ICD-10-CM

## 2023-01-27 DIAGNOSIS — G9589 Other specified diseases of spinal cord: Secondary | ICD-10-CM | POA: Diagnosis not present

## 2023-01-27 DIAGNOSIS — C9000 Multiple myeloma not having achieved remission: Secondary | ICD-10-CM | POA: Diagnosis not present

## 2023-01-27 MED ORDER — LACTULOSE 10 GM/15ML PO SOLN
20.0000 g | Freq: Once | ORAL | Status: AC
  Administered 2023-01-27: 20 g via ORAL
  Filled 2023-01-27: qty 30

## 2023-01-27 MED ORDER — ALUM & MAG HYDROXIDE-SIMETH 200-200-20 MG/5ML PO SUSP
30.0000 mL | ORAL | Status: DC | PRN
  Administered 2023-01-27 – 2023-02-05 (×3): 30 mL via ORAL
  Filled 2023-01-27 (×3): qty 30

## 2023-01-27 NOTE — Progress Notes (Signed)
Rounded and saw Paula Garrett this afternoon.  She continues to have full strength in her bilateral lower extremities.  She continues to have radicular pain in her thoracic region.  We discussed the risk and benefits of surgery including weakness, numbness, pain, need for further surgery.  We discussed the risk of paralysis.  We also discussed the risk of anesthesia.  Given her lytic lesion in her thoracic spine with bony destruction of T6 and T7 and extension into the posterior elements unilaterally at 1 level and bilaterally at another level loss of most of her anterior spinal architecture on the pathologic side we will plan for prophylactic stabilization and fusion.  Given the access we will also obtain specimen for biopsy and partial decompression.  Patient agrees to go forward with the procedure.  We are planning on posting the case for tomorrow midmorning.  All of her questions were answered.

## 2023-01-27 NOTE — Plan of Care (Signed)
  Problem: Education: Goal: Knowledge of General Education information will improve Description: Including pain rating scale, medication(s)/side effects and non-pharmacologic comfort measures Outcome: Progressing   Problem: Activity: Goal: Risk for activity intolerance will decrease Outcome: Progressing   Problem: Elimination: Goal: Will not experience complications related to bowel motility Outcome: Progressing Goal: Will not experience complications related to urinary retention Outcome: Progressing   

## 2023-01-27 NOTE — Progress Notes (Signed)
Progress Note   Patient: Paula Garrett:[id number] DOB: 17-Jan-1966 DOA: 01/24/2023     3 DOS: the patient was seen and examined on 01/27/2023   Brief hospital course: [name] is a 57 year old female with history of hypertension, depression, anxiety, history of tobacco use, GERD, who presents to the emergency department for chief concerns of chest pain and shortness of breath for 1 month.  Patient also has known multiple myeloma which has been followed as outpatient without requiring any treatment. Imaging study showed large soft tissue mass in left paraspinal soft tissue at T6-T7 with destruction of seventh rib and invasion of T6 and T7 vertebral bodies involving left posterior elements as well as epidural tumor and spinal canal with moderate spinal canal stenosis without frank cord compression or cord edema, acute/subacute compression fracture of T11 vertebral body with up to 30% vertebral body height and minimal bony retropulsion, possibly pathologic. Right occipital calvarium with small enhancing lesion, suspicious for additional metastatic or myeloma lesion. Patient has been seen by neurosurgery, started on IV steroids.  Scheduled for spinal surgery on next Tuesday.   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 Patient has been evaluated by neurosurgery as well as oncology, started on IV steroids.  Scheduled for spinal surgery on  Tuesday.  Will keep patient in the hospital, continue IV steroids.   Pain medicine changed to Percocet, patient doing well.  Surgery for spinal stabilization will be performed tomorrow.  Severe constipation. Secondary to pain medicine, patient still has no bowel movement after giving lactulose.  Will give additional dose of lactulose and give enema.   Hypercalcemia. Hyponatremia. Calcium level has normalized after giving fluids.  Sodium level also improving.   Essential hypertension. Continue amlodipine.      Subjective:  Patient pain is better today, still constipated, no bowel movement after giving lactulose yesterday.  Physical Exam: Vitals:   01/26/23 1627 01/26/23 2110 01/27/23 0405 01/27/23 0739  BP: (!) 146/86 (!) 144/79 136/72 139/80  Pulse: 83 88 79 76  Resp: 16 18  16   Temp: 98.1 F (36.7 C) 97.8 F (36.6 C) 97.8 F (36.6 C) 97.8 F (36.6 C)  TempSrc:  Oral Oral   SpO2: 95% 95% 91% 94%  Weight:      Height:       General exam: Appears calm and comfortable  Respiratory system: Clear to auscultation. Respiratory effort normal. Cardiovascular system: S1 & S2 heard, RRR. No JVD, murmurs, rubs, gallops or clicks. No pedal edema. Gastrointestinal system: Abdomen is nondistended, soft and nontender. No organomegaly or masses felt. Normal bowel sounds heard. Central nervous system: Alert and oriented. No focal neurological deficits. Extremities: Symmetric 5 x 5 power. Skin: No rashes, lesions or ulcers Psychiatry: Judgement and insight appear normal. Mood & affect appropriate.     Data Reviewed:  There are no new results to review at this time.  Family Communication: None  Disposition: Status  is: Inpatient Remains inpatient appropriate because: Severity of disease, inpatient procedure.     Time spent: 35 minutes  Author: Marrion Coy, MD 01/27/2023 12:27 PM  For on call review [email or web address].

## 2023-01-27 NOTE — Consult Note (Signed)
Palliative Medicine [facility] at [facility] Telephone:([phone]) [phone] [phone]   Name: Paula Garrett Date: 01/27/2023 MRN: [id number]  DOB: [birth year]  Patient Care Team: Smitty Cords, DO as PCP - General (Family Medicine) Lemar Livings, Merrily Pew, MD (General Surgery) Shelia Media, MD (Internal Medicine) Creig Hines, MD as Consulting Physician (Oncology)    REASON FOR CONSULTATION: Paula Garrett is a 57 y.o. female with multiple medical problems including hypertension, depression, anxiety, history of tobacco abuse, and multiple myeloma, who was admitted to hospital 01/24/2023 with chest pain and shortness of breath and was found to have a large soft tissue mass in the left T6-T7 paraspinal area with destruction of seventh rib and invasion of T6 and T7 vertebral bodies.  Patient has had ongoing pain and constipation.  She was referred to palliative care to address goals and manage ongoing symptoms.  SOCIAL HISTORY:     reports that she has been smoking cigarettes. She started smoking about 42 years ago. She has a 42.9 pack-year smoking history. She has never used smokeless tobacco. She reports current alcohol use of about 10.0 - 12.0 standard drinks of alcohol per week. She reports that she does not use drugs.  Patient is unmarried and lives at home alone.  She has a son in [place] and a daughter who lives in [place].  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   ADVANCE DIRECTIVES:  Does not have  CODE STATUS: Full code  PAST MEDICAL HISTORY: Past Medical History:  Diagnosis Date   Anxiety    Arthritis    Asthma    Barrett's esophagus 2015   COVID-19    03/10/20   Depression    GERD (gastroesophageal reflux disease)    Hypertension    Hypothyroidism    Pneumonia    Smoldering myeloma    Thyroid disease     PAST SURGICAL HISTORY:  Past Surgical  History:  Procedure Laterality Date   ABLATION  2006   BREAST CYST ASPIRATION Left 1998   BREAST SURGERY     cyst aspiration   BREAST SURGERY  1998   cyst aspiration   COLONOSCOPY  03/2014   COLONOSCOPY WITH PROPOFOL N/A 01/06/2020   Procedure: COLONOSCOPY WITH PROPOFOL;  Surgeon: Toledo, Boykin Nearing, MD;  Location: [facility] ENDOSCOPY;  Service: Gastroenterology;  Laterality: N/A;   ENDOMETRIAL ABLATION     ESOPHAGOGASTRODUODENOSCOPY (EGD) WITH PROPOFOL N/A 08/15/2015   Procedure: ESOPHAGOGASTRODUODENOSCOPY (EGD) WITH PROPOFOL;  Surgeon: Christena Deem, MD;  Location: [facility] ENDOSCOPY;  Service: Endoscopy;  Laterality: N/A;   ESOPHAGOGASTRODUODENOSCOPY (EGD) WITH PROPOFOL N/A 01/22/2016   Procedure: ESOPHAGOGASTRODUODENOSCOPY (EGD) WITH PROPOFOL;  Surgeon: Christena Deem, MD;  Location: [facility] ENDOSCOPY;  Service: Endoscopy;  Laterality: N/A;   ESOPHAGOGASTRODUODENOSCOPY (EGD) WITH PROPOFOL N/A 01/06/2020   Procedure: ESOPHAGOGASTRODUODENOSCOPY (EGD) WITH PROPOFOL;  Surgeon: Toledo, Boykin Nearing, MD;  Location: [facility] ENDOSCOPY;  Service: Gastroenterology;  Laterality: N/A;   GASTRIC BYPASS  2005   HERNIA REPAIR     NASAL SINUS SURGERY     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   TOTAL THYROIDECTOMY     TUBAL LIGATION      HEMATOLOGY/ONCOLOGY HISTORY:  Oncology History   No history exists.    ALLERGIES:  is allergic to hctz [hydrochlorothiazide].  MEDICATIONS:  Current Facility-Administered Medications  Medication Dose Route Frequency Provider Last Rate Last Admin   acetaminophen (TYLENOL) tablet  650 mg  650 mg Oral Q6H PRN Cox, Amy N, DO   650 mg at 01/27/23 0848   Or   acetaminophen (TYLENOL) suppository 650 mg  650 mg Rectal Q6H PRN Cox, Amy N, DO       amLODipine (NORVASC) tablet 10 mg  10 mg Oral Daily Cox, Amy N, DO   10 mg at 01/27/23 0849   clonazePAM (KLONOPIN) tablet 1 mg  1 mg Oral BID Cox, Amy N, DO   1 mg at 01/27/23 0848   cyclobenzaprine (FLEXERIL) tablet 7.5 mg  7.5 mg  Oral TID PRN Cox, Amy N, DO   7.5 mg at 01/25/23 0047   dexamethasone (DECADRON) injection 8 mg  8 mg Intravenous Q12H Creig Hines, MD   8 mg at 01/27/23 0848   escitalopram (LEXAPRO) tablet 10 mg  10 mg Oral Daily Cox, Amy N, DO   10 mg at 01/27/23 0848   heparin injection 5,000 Units  5,000 Units Subcutaneous Q8H Cox, Amy N, DO   5,000 Units at 01/27/23 0544   irbesartan (AVAPRO) tablet 300 mg  300 mg Oral Daily Cox, Amy N, DO   300 mg at 01/27/23 0848   levothyroxine (SYNTHROID) tablet 250 mcg  250 mcg Oral QAC breakfast Cox, Amy N, DO   250 mcg at 01/27/23 0544   mometasone-formoterol (DULERA) 200-5 MCG/ACT inhaler 2 puff  2 puff Inhalation BID Coulter, Eber Jones, RPH   2 puff at 01/27/23 0847   nicotine (NICODERM CQ - dosed in mg/24 hours) patch 21 mg  21 mg Transdermal Daily PRN Cox, Amy N, DO   21 mg at 01/26/23 0413   ondansetron (ZOFRAN) tablet 4 mg  4 mg Oral Q6H PRN Cox, Amy N, DO       Or   ondansetron (ZOFRAN) injection 4 mg  4 mg Intravenous Q6H PRN Cox, Amy N, DO       oxyCODONE-acetaminophen (PERCOCET/ROXICET) 5-325 MG per tablet 1 tablet  1 tablet Oral Q4H PRN Marrion Coy, MD   1 tablet at 01/27/23 1105   pantoprazole (PROTONIX) EC tablet 40 mg  40 mg Oral BID AC Cox, Amy N, DO   40 mg at 01/27/23 0849   polyethylene glycol (MIRALAX / GLYCOLAX) packet 17 g  17 g Oral BID PRN Cox, Amy N, DO   17 g at 01/24/23 1718   senna-docusate (Senokot-S) tablet 1 tablet  1 tablet Oral BID Cox, Amy N, DO   1 tablet at 01/27/23 0858   umeclidinium bromide (INCRUSE ELLIPTA) 62.5 MCG/ACT 1 puff  1 puff Inhalation Daily Coulter, Eber Jones, RPH   1 puff at 01/27/23 0847   Vitamin D (Ergocalciferol) (DRISDOL) 1.25 MG (50000 UNIT) capsule 50,000 Units  50,000 Units Oral Q7 days Cox, Amy N, DO   50,000 Units at 01/25/23 1051   zolpidem (AMBIEN) tablet 5 mg  5 mg Oral QHS PRN Cox, Amy N, DO   5 mg at 01/25/23 2218    VITAL SIGNS: BP 139/80 (BP Location: Left Arm)   Pulse 76   Temp 97.8 F (36.6 C)    Resp 16   Ht 5\' 2"  (1.575 m)   Wt 170 lb (77.1 kg)   SpO2 94%   BMI 31.09 kg/m  Filed Weights   01/24/23 0845  Weight: 170 lb (77.1 kg)    Estimated body mass index is 31.09 kg/m as calculated from the following:   Height as of this encounter: 5\' 2"  (1.575 m).   Weight  as of this encounter: 170 lb (77.1 kg).  LABS: CBC:    Component Value Date/Time   WBC 7.9 01/25/2023 0452   HGB 11.2 (L) 01/25/2023 0452   HGB 13.0 02/14/2012 1528   HCT 32.9 (L) 01/25/2023 0452   HCT 38.5 07/02/2011 1529   PLT 264 01/25/2023 0452   PLT 311 07/02/2011 1529   MCV 95.6 01/25/2023 0452   MCV 90 07/02/2011 1529   NEUTROABS 4.1 09/10/2022 1329   LYMPHSABS 2.9 09/10/2022 1329   MONOABS 0.6 09/10/2022 1329   EOSABS 0.1 09/10/2022 1329   BASOSABS 0.0 09/10/2022 1329   Comprehensive Metabolic Panel:    Component Value Date/Time   NA 131 (L) 01/26/2023 0401   K 4.1 01/26/2023 0401   CL 97 (L) 01/26/2023 0401   CO2 23 01/26/2023 0401   BUN 12 01/26/2023 0401   CREATININE 0.70 01/26/2023 0401   GLUCOSE 121 (H) 01/26/2023 0401   CALCIUM 9.8 01/26/2023 0401   AST 20 09/10/2022 1329   ALT 17 09/10/2022 1329   ALKPHOS 48 09/10/2022 1329   BILITOT 0.4 09/10/2022 1329   PROT 8.6 (H) 09/10/2022 1329   ALBUMIN 3.0 (L) 09/10/2022 1329    RADIOGRAPHIC STUDIES: MR THORACIC SPINE W WO CONTRAST  Result Date: 01/24/2023 CLINICAL DATA:  Soft tissue mass with bony destruction involving the left seventh rib and T6 and T7 vertebral bodies. EXAM: MRI CERVICAL AND THORACIC SPINE WITHOUT AND WITH CONTRAST TECHNIQUE: Multiplanar and multiecho pulse sequences of the cervical spine, to include the craniocervical junction and cervicothoracic junction, and the thoracic spine, were obtained without and with intravenous contrast. CONTRAST:  7mL GADAVIST GADOBUTROL 1 MMOL/ML IV SOLN COMPARISON:  Same day CTA chest, cervical and thoracic spine MRI 07/20/2020 FINDINGS: MRI CERVICAL SPINE FINDINGS Alignment: There is  trace retrolisthesis of C3 on C4 and trace anterolisthesis of C5 on C6. Vertebrae: Vertebral body heights are preserved. Background marrow signal is normal. A T1 hypointense/T2 hyperintense lesion in the clivus is unchanged since 2022, favored benign. There is no suspicious marrow signal abnormality or marrow edema in the cervical spine. There is no abnormal marrow enhancement in the cervical spine. There is a 6 mm enhancing lesion in the right occipital calvarium which was not seen on the prior cervical spine MRI from 2022; however, this area may not has been included within the field of view. Cord: Normal in signal and morphology. Posterior Fossa, vertebral arteries, paraspinal tissues: The imaged posterior fossa is unremarkable. The vertebral artery flow voids are normal. The paraspinal soft tissues are unremarkable. Disc levels: There is overall mild degenerative change in the cervical spine without significant spinal canal or neural foraminal stenosis. MRI THORACIC SPINE FINDINGS Alignment:  Normal. Vertebrae: Background marrow signal is normal. There is infiltrative T1 hypointensity occupying most of the T6 and T7 vertebral bodies extending into the left pedicles and facets. There is a bulky soft tissue mass in the adjacent left paraspinal/subpleural soft tissues with destruction of the seventh rib. The bulk of the soft tissue component measures up to proximally 7.2 cm x 3.4 cm x 2.7 cm. 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 There is mild loss of vertebral body height at both levels consistent with associated pathologic fractures. There is compression deformity of the T11  vertebral body with up to approximately 30% loss of vertebral body height anteriorly and minimal bony  retropulsion. There is faint edema along the superior endplate consistent with acute to subacute chronicity. This fracture may be pathologic. There are probable additional lesions involving multiple additional ribs (for example 25-21, 25-24). The above findings are new since the MRI from 2022. Cord:  There is no cord signal abnormality or abnormal enhancement. Paraspinal and other soft tissues: Unremarkable, aside from the bulky paraspinal tumor described above. Disc levels: There is a small disc protrusion at T10-T11 without significant spinal canal or neural foraminal stenosis. Otherwise, there is overall minimal background degenerative change without significant spinal canal or neural foraminal stenosis. IMPRESSION: 1. Large soft tissue mass in the left paraspinal soft tissues at T6-T7 with destruction of the seventh rib and invasion of the T6 and T7 vertebral bodies with involvement of the left posterior elements as well as epidural tumor in the spinal canal resulting in moderate spinal canal stenosis without frank cord compression or cord edema. 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 2. Acute to subacute compression fracture of the T11 vertebral body with up to approximately 30% loss of vertebral body height and minimal bony retropulsion, possibly pathologic. 3. Small enhancing lesion in the right occipital calvarium, not definitely present on the prior cervical spine from 2022 (though possibly not included within the field of view on that study), suspicious for an additional metastatic or myeloma lesion. 4. Additional probable lesions involving multiple additional ribs. 5. No acute or suspicious finding in the cervical spine. Electronically Signed   By: Lesia Hausen M.D.   On: 01/24/2023 14:33   MR Cervical Spine W and Wo Contrast  Result Date: 01/24/2023 CLINICAL DATA:  Soft tissue mass with bony destruction involving the left seventh rib and T6 and  T7 vertebral bodies. EXAM: MRI CERVICAL AND THORACIC SPINE WITHOUT AND WITH CONTRAST TECHNIQUE: Multiplanar and multiecho pulse sequences of the cervical spine, to include the craniocervical junction and cervicothoracic junction, and the thoracic spine, were obtained without and with intravenous contrast. CONTRAST:  7mL GADAVIST GADOBUTROL 1 MMOL/ML IV SOLN COMPARISON:  Same day CTA chest, cervical and thoracic spine MRI 07/20/2020 FINDINGS: MRI CERVICAL SPINE FINDINGS Alignment: There is trace retrolisthesis of C3 on C4 and trace anterolisthesis of C5 on C6. Vertebrae: Vertebral body heights are preserved. Background marrow signal is normal. A T1 hypointense/T2 hyperintense lesion in the clivus is unchanged since 2022, favored benign. There is no suspicious marrow signal abnormality or marrow edema in the cervical spine. There is no abnormal marrow enhancement in the cervical spine. There is a 6 mm enhancing lesion in the right occipital calvarium which was not seen on the prior cervical spine MRI from 2022; however, this area may not has been included within the field of view. Cord: Normal in signal and morphology. Posterior Fossa, vertebral arteries, paraspinal tissues: The imaged posterior fossa is unremarkable. The vertebral artery flow voids are normal. The paraspinal soft tissues are unremarkable. Disc levels: There is overall mild degenerative change in the cervical spine without significant spinal canal or neural foraminal stenosis. MRI THORACIC SPINE FINDINGS Alignment:  Normal. Vertebrae: Background marrow signal is normal. There is infiltrative T1 hypointensity occupying most of the T6 and T7 vertebral bodies extending into the left pedicles and facets. There is a bulky soft tissue mass in the adjacent left paraspinal/subpleural soft tissues with destruction of the seventh rib. The bulk of the soft tissue component measures up  to proximally 7.2 cm x 3.4 cm x 2.7 cm. There is extension into the T6-T7 and  T7-T8 neural foramina as well as epidural tumor along the ventral and left dorsal lateral aspects of the spinal canal resulting in moderate spinal canal stenosis with effacement of the thecal sac but no frank cord compression or cord edema. There is mild loss of vertebral body height at both levels consistent with associated pathologic fractures. There is compression deformity of the T11 vertebral body with up to approximately 30% loss of vertebral body height anteriorly and minimal bony retropulsion. There is faint edema along the superior endplate consistent with acute to subacute chronicity. This fracture may be pathologic. There are probable additional lesions involving multiple additional ribs (for example 25-21, 25-24). The above findings are new since the MRI from 2022. Cord:  There is no cord signal abnormality or abnormal enhancement. Paraspinal and other soft tissues: Unremarkable, aside from the bulky paraspinal tumor described above. Disc levels: There is a small disc protrusion at T10-T11 without significant spinal canal or neural foraminal stenosis. Otherwise, there is overall minimal background degenerative change without significant spinal canal or neural foraminal stenosis. IMPRESSION: 1. Large soft tissue mass in the left paraspinal soft tissues at T6-T7 with destruction of the seventh rib and invasion of the T6 and T7 vertebral bodies with involvement of the left posterior elements as well as epidural tumor in the spinal canal resulting in moderate spinal canal stenosis without frank cord compression or cord edema. 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 2. Acute to subacute compression fracture of the T11 vertebral body with up to approximately 30% loss of vertebral body height and minimal bony retropulsion, possibly pathologic. 3. Small enhancing lesion in the right occipital calvarium, not definitely present on the prior cervical spine  from 2022 (though possibly not included within the field of view on that study), suspicious for an additional metastatic or myeloma lesion. 4. Additional probable lesions involving multiple additional ribs. 5. No acute or suspicious finding in the cervical spine. Electronically Signed   By: Lesia Hausen M.D.   On: 01/24/2023 14:33   CT Angio Chest PE W and/or Wo Contrast  Result Date: 01/24/2023 CLINICAL DATA:  Chest pain and shortness of breath for a month. EXAM: CT ANGIOGRAPHY CHEST WITH CONTRAST TECHNIQUE: Multidetector CT imaging of the chest was performed using the standard protocol during bolus administration of intravenous contrast. Multiplanar CT image reconstructions and MIPs were obtained to evaluate the vascular anatomy. RADIATION DOSE REDUCTION: This exam was performed according to the departmental dose-optimization program which includes automated exposure control, adjustment of the mA and/or kV according to patient size and/or use of iterative reconstruction technique. CONTRAST:  75mL OMNIPAQUE IOHEXOL 350 MG/ML SOLN COMPARISON:  X-ray 01/24/2023 and older.  Previous CT January 2017 FINDINGS: Cardiovascular: The thoracic aorta has a normal course and caliber with minimal calcified plaque. There is a bovine type aortic arch, normal variant. Coronary artery calcifications are seen. Please correlate for other coronary risk factors. Heart is nonenlarged. There is slight wall thickening of the left ventricle. Prominent fat along the intra-atrial septum. Pulmonary arteries show some slight enlargement centrally. Please correlate for any evidence of pulmonary artery hypertension. There is heterogeneous enhancement of the pulmonary arterial tree limiting evaluation for emboli. No obvious large and central embolus although several areas are nondiagnostic. Mediastinum/Nodes: Surgical changes identified with a moderate hiatal hernia. Small thyroid gland. No specific abnormal lymph node enlargement identified  in the axillary  region, hilum or mediastinum. There are some small nodes identified in the posterior mediastinum, posterior to the descending thoracic aorta such as series 4, image 86 but not pathologic by size criteria. Lungs/Pleura: There is some linear opacity lung bases likely scar or atelectasis. No consolidation. Breathing motion identified. No pleural effusion or pneumothorax. 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 Benign hepatic cysts as well. Musculoskeletal: There is large destructive mass involving the posterior aspect of the left seventh rib with extension to involve the central canal of the spine in the associated vertebral bodies of T6 and T7. Cord compression is possible. This mass is estimated in diameter on series 4, image 65 at 8.5 by 4.3 cm. Slight extension along the extrapleural space in the posterior mediastinum as well. There is question of some subtle other lucent lesions identified along the spine but indeterminate. Critical Value/emergent results were called by telephone at the time of interpretation on 01/24/2023 at 8:41 am to provider [facility] , who verbally acknowledged these results. Review of the MIP images confirms the above findings. IMPRESSION: Aggressive soft tissue mass with bone destruction involving the left seventh rib as well as the T6 and T7 vertebral bodies and central canal. A neoplastic process is possible. There also is a possibility of significant cord compression with canal encroachment along the spine. Recommend further evaluation with spine MRI with and without contrast and a whole-body bone scan. There appears to be some other subtle lucent bone lesions along the spine and sternum. Please correlate for any history of known malignancy including such processes as myeloma or other. Poor opacification of the pulmonary arterial tree limiting evaluation. Several areas are non diagnostic for pulmonary emboli.  No obvious large and central embolus. Aortic Atherosclerosis (ICD10-I70.0). Electronically Signed   By: Karen Kays M.D.   On: 01/24/2023 11:47   DG Chest 2 View  Result Date: 01/24/2023 CLINICAL DATA:  Chest pain EXAM: CHEST - 2 VIEW COMPARISON:  Chest x-ray dated January 07, 2023 FINDINGS: The heart size and mediastinal contours are within normal limits. Small hiatal hernia. Both lungs are clear. Pleural-based nodular opacity of the left posterior hemithorax. The visualized skeletal structures are unremarkable. IMPRESSION: Pleural-based nodular opacity of the left posterior hemithorax. Recommend further evaluation with contrast enhanced chest CT. Electronically Signed   By: Allegra Lai M.D.   On: 01/24/2023 09:38   DG Ribs Unilateral Left  Result Date: 01/09/2023 CLINICAL DATA:  Fall, left rib pain. EXAM: LEFT RIBS - 2 VIEW COMPARISON:  Chest radiograph 11/01/2022 FINDINGS: The cardiomediastinal silhouette is stable and within normal limits. Linear opacities in the left midlung likely reflect atelectasis or scar. There is no other focal airspace opacity. There is no pulmonary edema. There is no pleural effusion or pneumothorax There is no displaced rib fracture or other acute osseous abnormality. IMPRESSION: No displaced rib fracture or other acute osseous abnormality identified. Electronically Signed   By: Lesia Hausen M.D.   On: 01/09/2023 17:13   DG Lumbar Spine Complete  Result Date: 01/09/2023 CLINICAL DATA:  Trauma, fall, pain EXAM: LUMBAR SPINE - COMPLETE 4+ VIEW COMPARISON:  12/24/2018 FINDINGS: No recent fracture is seen. Alignment of posterior margins of vertebral bodies appears normal. Small bony spurs are noted. Facet hypertrophy is seen, more so at L5-S1 level. Arterial calcifications are seen in aorta. IMPRESSION: No recent fracture is seen. Degenerative changes are noted, more so in facet joints in lower lumbar spine. Aortic arteriosclerosis. Electronically Signed  By: Ernie Avena M.D.   On: 01/09/2023 17:12    PERFORMANCE STATUS (ECOG) : 1 - Symptomatic but completely ambulatory  Review of Systems Unless otherwise noted, a complete review of systems is negative.  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  She is pending neurosurgery tomorrow.  Patient has had ongoing persistent back pain requiring frequent dosing of Percocet.  Today, patient says that she is doing much better as long as she takes Percocet every 4-5 hours around-the-clock.  Hopefully, pain will improve following surgery.  Could consider starting long-acting opioid if needed.  Constipation has also been a problem.  Patient endorses chronic constipation but this has been worse on opioids.  She has had several doses of lactulose and enema.  Patient says that she has taken several walks around the nursing station today and that has helped her have a small bowel movement.  Patient verbalized agreement with current scope of treatment.    At baseline, she lives at home alone but reports that she has good social support from friends and work family.  She has a son and daughter who do not live nearby.  PLAN: -Continue current scope of treatment -Continue as needed Percocet -Can consider starting a long-acting opioid if needed -Continue aggressive bowel regimen -Could consider methylnaltrexone for opioid-induced constipation -Patient pending surgery tomorrow   Time Total: 30 minutes  Visit consisted of counseling and education dealing with the complex and emotionally intense issues of symptom management and palliative care in the setting of serious and potentially  life-threatening illness.Greater than 50%  of this time was spent counseling and coordinating care related to the above assessment and plan.  Signed by: Laurette Schimke, PhD, NP-C

## 2023-01-27 NOTE — Telephone Encounter (Unsigned)
Copied from CRM [id number]. Topic: General - Inquiry >> Jan 27, 2023  9:53 AM Paula Garrett wrote: Reason for CRM: pt called saying she had to go to the hospital this weekend and they found a mass and that is was what was causing the pain she was having.  She is going to be having surgery.  She is at [facility].

## 2023-01-27 NOTE — Telephone Encounter (Signed)
Reviewed ER note and imaging.  Will leave for PCP review as well.

## 2023-01-27 NOTE — Plan of Care (Signed)

## 2023-01-28 ENCOUNTER — Inpatient Hospital Stay: Payer: Managed Care, Other (non HMO) | Admitting: Anesthesiology

## 2023-01-28 ENCOUNTER — Encounter
Admission: EM | Disposition: A | Discharge status: Home Health | Payer: Self-pay | Source: Home / Self Care | Attending: Internal Medicine

## 2023-01-28 ENCOUNTER — Encounter: Payer: Self-pay | Admitting: Neurosurgery

## 2023-01-28 ENCOUNTER — Inpatient Hospital Stay: Payer: Managed Care, Other (non HMO)

## 2023-01-28 ENCOUNTER — Other Ambulatory Visit: Payer: Self-pay

## 2023-01-28 DIAGNOSIS — G9589 Other specified diseases of spinal cord: Secondary | ICD-10-CM | POA: Diagnosis not present

## 2023-01-28 DIAGNOSIS — M532X4 Spinal instabilities, thoracic region: Secondary | ICD-10-CM

## 2023-01-28 DIAGNOSIS — C9000 Multiple myeloma not having achieved remission: Secondary | ICD-10-CM | POA: Diagnosis not present

## 2023-01-28 DIAGNOSIS — D434 Neoplasm of uncertain behavior of spinal cord: Secondary | ICD-10-CM

## 2023-01-28 HISTORY — PX: APPLICATION OF INTRAOPERATIVE CT SCAN: SHX6668

## 2023-01-28 LAB — BPAM RBC
Blood Product Expiration Date: 202408302359
ISSUE DATE / TIME: 202408131302
Unit Type and Rh: 6200

## 2023-01-28 LAB — PREPARE RBC (CROSSMATCH)

## 2023-01-28 LAB — ABO/RH: ABO/RH(D): A POS

## 2023-01-28 SURGERY — POSTERIOR THORACIC FUSION 4 LEVELS
Anesthesia: General

## 2023-01-28 MED ORDER — PROPOFOL 10 MG/ML IV BOLUS
INTRAVENOUS | Status: DC | PRN
Start: 2023-01-28 — End: 2023-01-28
  Administered 2023-01-28: 150 mg via INTRAVENOUS

## 2023-01-28 MED ORDER — BUPIVACAINE HCL (PF) 0.5 % IJ SOLN
INTRAMUSCULAR | Status: AC
  Filled 2023-01-28: qty 30

## 2023-01-28 MED ORDER — ACETAMINOPHEN 650 MG RE SUPP: 650.0000 mg | RECTAL | Status: DC | PRN

## 2023-01-28 MED ORDER — ACETAMINOPHEN 500 MG PO TABS
1000.0000 mg | ORAL_TABLET | Freq: Four times a day (QID) | ORAL | Status: DC
  Administered 2023-01-28 – 2023-02-06 (×32): 1000 mg via ORAL
  Filled 2023-01-28 (×34): qty 2

## 2023-01-28 MED ORDER — HYDROMORPHONE HCL 1 MG/ML IJ SOLN
0.5000 mg | INTRAMUSCULAR | Status: DC | PRN
  Administered 2023-01-28 – 2023-01-31 (×5): 0.5 mg via INTRAVENOUS
  Filled 2023-01-28 (×5): qty 0.5

## 2023-01-28 MED ORDER — LACTATED RINGERS IV SOLN: INTRAVENOUS | Status: DC

## 2023-01-28 MED ORDER — PHENYLEPHRINE HCL (PRESSORS) 10 MG/ML IV SOLN
INTRAVENOUS | Status: AC
  Filled 2023-01-28: qty 1

## 2023-01-28 MED ORDER — VANCOMYCIN HCL IN DEXTROSE 1-5 GM/200ML-% IV SOLN
INTRAVENOUS | Status: AC
  Filled 2023-01-28: qty 200

## 2023-01-28 MED ORDER — PHENYLEPHRINE HCL (PRESSORS) 10 MG/ML IV SOLN
INTRAVENOUS | Status: DC | PRN
  Administered 2023-01-28 (×4): 120 ug via INTRAVENOUS

## 2023-01-28 MED ORDER — PHENOL 1.4 % MT LIQD: 1.0000 | OROMUCOSAL | Status: DC | PRN

## 2023-01-28 MED ORDER — ONDANSETRON HCL 4 MG/2ML IJ SOLN
INTRAMUSCULAR | Status: DC | PRN
  Administered 2023-01-28: 4 mg via INTRAVENOUS

## 2023-01-28 MED ORDER — EPINEPHRINE PF 1 MG/ML IJ SOLN
INTRAMUSCULAR | Status: AC
  Filled 2023-01-28: qty 1

## 2023-01-28 MED ORDER — FENTANYL CITRATE (PF) 100 MCG/2ML IJ SOLN
INTRAMUSCULAR | Status: AC
  Filled 2023-01-28: qty 2

## 2023-01-28 MED ORDER — DEXAMETHASONE SODIUM PHOSPHATE 10 MG/ML IJ SOLN
INTRAMUSCULAR | Status: DC | PRN
  Administered 2023-01-28: 10 mg via INTRAVENOUS

## 2023-01-28 MED ORDER — KETAMINE HCL 10 MG/ML IJ SOLN
INTRAMUSCULAR | Status: DC | PRN
  Administered 2023-01-28: 30 mg via INTRAVENOUS
  Administered 2023-01-28: 20 mg via INTRAVENOUS

## 2023-01-28 MED ORDER — KETOROLAC TROMETHAMINE 15 MG/ML IJ SOLN
15.0000 mg | Freq: Four times a day (QID) | INTRAMUSCULAR | Status: AC
  Administered 2023-01-28 – 2023-01-29 (×4): 15 mg via INTRAVENOUS
  Filled 2023-01-28 (×4): qty 1

## 2023-01-28 MED ORDER — OXYCODONE HCL 5 MG PO TABS: 5.0000 mg | ORAL_TABLET | Freq: Once | ORAL | Status: DC | PRN

## 2023-01-28 MED ORDER — ACETAMINOPHEN 325 MG PO TABS
650.0000 mg | ORAL_TABLET | ORAL | Status: DC | PRN
  Administered 2023-02-04: 650 mg via ORAL
  Filled 2023-01-28: qty 2

## 2023-01-28 MED ORDER — SODIUM CHLORIDE 0.9% FLUSH: 3.0000 mL | INTRAVENOUS | Status: DC | PRN

## 2023-01-28 MED ORDER — ONDANSETRON HCL 4 MG/2ML IJ SOLN
INTRAMUSCULAR | Status: AC
  Filled 2023-01-28: qty 2

## 2023-01-28 MED ORDER — VANCOMYCIN HCL 1000 MG IV SOLR
INTRAVENOUS | Status: AC
  Filled 2023-01-28: qty 20

## 2023-01-28 MED ORDER — ACETAMINOPHEN 10 MG/ML IV SOLN: 1000.0000 mg | Freq: Once | INTRAVENOUS | Status: DC | PRN

## 2023-01-28 MED ORDER — LACTATED RINGERS IV SOLN: INTRAVENOUS | Status: DC | PRN

## 2023-01-28 MED ORDER — SODIUM CHLORIDE (PF) 0.9 % IJ SOLN
INTRAMUSCULAR | Status: DC | PRN
  Administered 2023-01-28: 60 mL via INTRAMUSCULAR

## 2023-01-28 MED ORDER — ALBUMIN HUMAN 5 % IV SOLN
INTRAVENOUS | Status: DC | PRN
Start: 2023-01-28 — End: 2023-01-28

## 2023-01-28 MED ORDER — CEFAZOLIN SODIUM-DEXTROSE 2-4 GM/100ML-% IV SOLN
INTRAVENOUS | Status: AC
  Filled 2023-01-28: qty 100

## 2023-01-28 MED ORDER — BUPIVACAINE LIPOSOME 1.3 % IJ SUSP
INTRAMUSCULAR | Status: AC
  Filled 2023-01-28: qty 20

## 2023-01-28 MED ORDER — ENOXAPARIN SODIUM 40 MG/0.4ML IJ SOSY
40.0000 mg | PREFILLED_SYRINGE | INTRAMUSCULAR | Status: DC
  Administered 2023-01-29 – 2023-02-06 (×9): 40 mg via SUBCUTANEOUS
  Filled 2023-01-28 (×9): qty 0.4

## 2023-01-28 MED ORDER — SODIUM CHLORIDE FLUSH 0.9 % IV SOLN
INTRAVENOUS | Status: AC
  Filled 2023-01-28: qty 20

## 2023-01-28 MED ORDER — OXYCODONE HCL 5 MG/5ML PO SOLN: 5.0000 mg | Freq: Once | ORAL | Status: DC | PRN

## 2023-01-28 MED ORDER — SUCCINYLCHOLINE CHLORIDE 200 MG/10ML IV SOSY
PREFILLED_SYRINGE | INTRAVENOUS | Status: AC
  Filled 2023-01-28: qty 10

## 2023-01-28 MED ORDER — MORPHINE SULFATE (PF) 2 MG/ML IV SOLN: 1.0000 mg | INTRAVENOUS | Status: DC | PRN

## 2023-01-28 MED ORDER — ROCURONIUM BROMIDE 10 MG/ML (PF) SYRINGE
PREFILLED_SYRINGE | INTRAVENOUS | Status: AC
  Filled 2023-01-28: qty 10

## 2023-01-28 MED ORDER — OXYCODONE HCL 5 MG PO TABS
ORAL_TABLET | ORAL | Status: AC
  Filled 2023-01-28: qty 2

## 2023-01-28 MED ORDER — LIDOCAINE HCL (CARDIAC) PF 100 MG/5ML IV SOSY
PREFILLED_SYRINGE | INTRAVENOUS | Status: DC | PRN
  Administered 2023-01-28: 100 mg via INTRAVENOUS

## 2023-01-28 MED ORDER — DOCUSATE SODIUM 100 MG PO CAPS
100.0000 mg | ORAL_CAPSULE | Freq: Two times a day (BID) | ORAL | Status: DC
  Administered 2023-01-28 – 2023-02-05 (×16): 100 mg via ORAL
  Filled 2023-01-28 (×16): qty 1

## 2023-01-28 MED ORDER — 0.9 % SODIUM CHLORIDE (POUR BTL) OPTIME
TOPICAL | Status: DC | PRN
  Administered 2023-01-28: 1000 mL
  Administered 2023-01-28: 500 mL

## 2023-01-28 MED ORDER — OXYCODONE HCL 5 MG PO TABS
10.0000 mg | ORAL_TABLET | ORAL | Status: DC | PRN
  Administered 2023-01-28 – 2023-02-03 (×23): 10 mg via ORAL
  Filled 2023-01-28 (×22): qty 2

## 2023-01-28 MED ORDER — VANCOMYCIN HCL IN DEXTROSE 1-5 GM/200ML-% IV SOLN
1000.0000 mg | Freq: Once | INTRAVENOUS | Status: AC
  Administered 2023-01-28: 1000 mg via INTRAVENOUS

## 2023-01-28 MED ORDER — DEXAMETHASONE SODIUM PHOSPHATE 10 MG/ML IJ SOLN
INTRAMUSCULAR | Status: AC
  Filled 2023-01-28: qty 1

## 2023-01-28 MED ORDER — HYDROMORPHONE HCL 1 MG/ML IJ SOLN
INTRAMUSCULAR | Status: DC | PRN
Start: 2023-01-28 — End: 2023-01-28
  Administered 2023-01-28 (×2): .5 mg via INTRAVENOUS

## 2023-01-28 MED ORDER — LIDOCAINE HCL (PF) 2 % IJ SOLN
INTRAMUSCULAR | Status: AC
  Filled 2023-01-28: qty 5

## 2023-01-28 MED ORDER — SODIUM CHLORIDE 0.9 % IV SOLN: INTRAVENOUS | Status: DC

## 2023-01-28 MED ORDER — MIDAZOLAM HCL 2 MG/2ML IJ SOLN
INTRAMUSCULAR | Status: DC | PRN
  Administered 2023-01-28: 2 mg via INTRAVENOUS

## 2023-01-28 MED ORDER — PROPOFOL 10 MG/ML IV BOLUS
INTRAVENOUS | Status: AC
  Filled 2023-01-28: qty 20

## 2023-01-28 MED ORDER — BISACODYL 10 MG RE SUPP: 10.0000 mg | Freq: Every day | RECTAL | Status: DC | PRN

## 2023-01-28 MED ORDER — CEFAZOLIN SODIUM-DEXTROSE 2-4 GM/100ML-% IV SOLN: 2.0000 g | Freq: Once | INTRAVENOUS | Status: DC

## 2023-01-28 MED ORDER — CYCLOBENZAPRINE HCL 10 MG PO TABS
10.0000 mg | ORAL_TABLET | Freq: Three times a day (TID) | ORAL | Status: DC | PRN
  Administered 2023-01-30: 10 mg via ORAL
  Filled 2023-01-28: qty 1

## 2023-01-28 MED ORDER — KETAMINE HCL 50 MG/5ML IJ SOSY
PREFILLED_SYRINGE | INTRAMUSCULAR | Status: AC
  Filled 2023-01-28: qty 5

## 2023-01-28 MED ORDER — FENTANYL CITRATE (PF) 100 MCG/2ML IJ SOLN
25.0000 ug | INTRAMUSCULAR | Status: DC | PRN
  Administered 2023-01-28 (×2): 50 ug via INTRAVENOUS

## 2023-01-28 MED ORDER — MENTHOL 3 MG MT LOZG: 1.0000 | LOZENGE | OROMUCOSAL | Status: DC | PRN

## 2023-01-28 MED ORDER — SODIUM CHLORIDE 0.9 % IV SOLN
250.0000 mL | INTRAVENOUS | Status: DC
  Administered 2023-01-28: 250 mL via INTRAVENOUS

## 2023-01-28 MED ORDER — SODIUM CHLORIDE 1 G PO TABS
1.0000 g | ORAL_TABLET | Freq: Two times a day (BID) | ORAL | Status: DC
  Administered 2023-01-28 – 2023-02-06 (×18): 1 g via ORAL
  Filled 2023-01-28 (×19): qty 1

## 2023-01-28 MED ORDER — SENNA 8.6 MG PO TABS
1.0000 | ORAL_TABLET | Freq: Two times a day (BID) | ORAL | Status: DC
  Administered 2023-01-28 – 2023-02-04 (×15): 8.6 mg via ORAL
  Filled 2023-01-28 (×15): qty 1

## 2023-01-28 MED ORDER — ROCURONIUM BROMIDE 100 MG/10ML IV SOLN
INTRAVENOUS | Status: DC | PRN
  Administered 2023-01-28: 30 mg via INTRAVENOUS
  Administered 2023-01-28: 20 mg via INTRAVENOUS
  Administered 2023-01-28: 50 mg via INTRAVENOUS
  Administered 2023-01-28: 20 mg via INTRAVENOUS

## 2023-01-28 MED ORDER — SURGIFLO WITH THROMBIN (HEMOSTATIC MATRIX KIT) OPTIME
TOPICAL | Status: DC | PRN
  Administered 2023-01-28: 2 via TOPICAL

## 2023-01-28 MED ORDER — GABAPENTIN 300 MG PO CAPS
300.0000 mg | ORAL_CAPSULE | Freq: Three times a day (TID) | ORAL | Status: DC
  Administered 2023-01-28 – 2023-01-31 (×9): 300 mg via ORAL
  Filled 2023-01-28 (×9): qty 1

## 2023-01-28 MED ORDER — LACTULOSE 10 GM/15ML PO SOLN
20.0000 g | Freq: Once | ORAL | Status: AC
  Administered 2023-01-28: 20 g via ORAL
  Filled 2023-01-28: qty 30

## 2023-01-28 MED ORDER — SODIUM CHLORIDE 0.9% FLUSH
3.0000 mL | Freq: Two times a day (BID) | INTRAVENOUS | Status: DC
  Administered 2023-01-28 – 2023-02-06 (×17): 3 mL via INTRAVENOUS

## 2023-01-28 MED ORDER — MIDAZOLAM HCL 2 MG/2ML IJ SOLN
INTRAMUSCULAR | Status: AC
  Filled 2023-01-28: qty 2

## 2023-01-28 MED ORDER — ONDANSETRON HCL 4 MG/2ML IJ SOLN: 4.0000 mg | Freq: Once | INTRAMUSCULAR | Status: DC | PRN

## 2023-01-28 MED ORDER — ALBUMIN HUMAN 5 % IV SOLN
INTRAVENOUS | Status: AC
  Filled 2023-01-28: qty 250

## 2023-01-28 MED ORDER — SUCCINYLCHOLINE CHLORIDE 200 MG/10ML IV SOSY
PREFILLED_SYRINGE | INTRAVENOUS | Status: DC | PRN
  Administered 2023-01-28: 100 mg via INTRAVENOUS

## 2023-01-28 MED ORDER — MORPHINE SULFATE (PF) 2 MG/ML IV SOLN
2.0000 mg | INTRAVENOUS | Status: DC | PRN
  Administered 2023-01-28: 2 mg via INTRAVENOUS
  Filled 2023-01-28: qty 1

## 2023-01-28 MED ORDER — MAGNESIUM CITRATE PO SOLN: 1.0000 | Freq: Once | ORAL | Status: DC | PRN

## 2023-01-28 MED ORDER — PHENYLEPHRINE HCL-NACL 20-0.9 MG/250ML-% IV SOLN
INTRAVENOUS | Status: DC | PRN
  Administered 2023-01-28: 40 ug/min via INTRAVENOUS

## 2023-01-28 MED ORDER — HYDROMORPHONE HCL 1 MG/ML IJ SOLN
INTRAMUSCULAR | Status: AC
  Filled 2023-01-28: qty 1

## 2023-01-28 MED ORDER — VANCOMYCIN HCL 1000 MG IV SOLR
INTRAVENOUS | Status: DC | PRN
Start: 2023-01-28 — End: 2023-01-28
  Administered 2023-01-28: 1000 mg

## 2023-01-28 MED ORDER — OXYCODONE HCL 5 MG PO TABS
5.0000 mg | ORAL_TABLET | ORAL | Status: DC | PRN
  Administered 2023-01-28 – 2023-02-04 (×5): 5 mg via ORAL
  Filled 2023-01-28 (×5): qty 1

## 2023-01-28 MED ORDER — BUPIVACAINE-EPINEPHRINE (PF) 0.5% -1:200000 IJ SOLN
INTRAMUSCULAR | Status: DC | PRN
  Administered 2023-01-28: 10 mL

## 2023-01-28 MED ORDER — FENTANYL CITRATE (PF) 100 MCG/2ML IJ SOLN
INTRAMUSCULAR | Status: DC | PRN
  Administered 2023-01-28: 100 ug via INTRAVENOUS
  Administered 2023-01-28 (×2): 50 ug via INTRAVENOUS

## 2023-01-28 SURGICAL SUPPLY — 59 items
ADH SKN CLS APL DERMABOND .7 (GAUZE/BANDAGES/DRESSINGS) ×1
AGENT HMST KT MTR STRL THRMB (HEMOSTASIS) ×2
BASIN KIT SINGLE STR (MISCELLANEOUS) ×1 IMPLANT
BONE CANC CHIPS 20CC PCAN1/4 (Bone Implant) ×1 IMPLANT
BUR NEURO DRILL SOFT 3.0X3.8M (BURR) ×1 IMPLANT
CHIPS CANC BONE 20CC PCAN1/4 (Bone Implant) ×1 IMPLANT
CNTNR URN SCR LID CUP LEK RST (MISCELLANEOUS) IMPLANT
CONT SPEC 4OZ STRL OR WHT (MISCELLANEOUS) ×1
DERMABOND ADVANCED .7 DNX12 (GAUZE/BANDAGES/DRESSINGS) ×1 IMPLANT
DRAPE C ARM PK CFD 31 SPINE (DRAPES) ×1 IMPLANT
DRAPE LAPAROTOMY 100X77 ABD (DRAPES) ×1 IMPLANT
DRAPE SCAN PATIENT (DRAPES) IMPLANT
DRESSING PEEL AND PLAC PRVNA20 (GAUZE/BANDAGES/DRESSINGS) IMPLANT
DRSG PEEL AND PLACE PREVENA 20 (GAUZE/BANDAGES/DRESSINGS) ×1
DRSG TELFA 3X4 N-ADH STERILE (GAUZE/BANDAGES/DRESSINGS) IMPLANT
ELECT REM PT RETURN 9FT ADLT (ELECTROSURGICAL) ×1
ELECTRODE REM PT RTRN 9FT ADLT (ELECTROSURGICAL) ×1 IMPLANT
GAUZE 4X4 16PLY ~~LOC~~+RFID DBL (SPONGE) IMPLANT
GLOVE BIOGEL PI IND STRL 6.5 (GLOVE) ×1 IMPLANT
GLOVE SURG SYN 6.5 ES PF (GLOVE) ×2 IMPLANT
GLOVE SURG SYN 6.5 PF PI (GLOVE) ×2 IMPLANT
GLOVE SURG SYN 8.5 E (GLOVE) ×3 IMPLANT
GLOVE SURG SYN 8.5 PF PI (GLOVE) ×3 IMPLANT
GOWN SRG LRG LVL 4 IMPRV REINF (GOWNS) ×1 IMPLANT
GOWN SRG XL LVL 3 NONREINFORCE (GOWNS) ×1 IMPLANT
GOWN STRL NON-REIN TWL XL LVL3 (GOWNS) ×1
GOWN STRL REIN LRG LVL4 (GOWNS) ×1
GRAFT BNE CANC CHIPS 1-8 20CC (Bone Implant) IMPLANT
HOLDER FOLEY CATH W/STRAP (MISCELLANEOUS) ×1 IMPLANT
KIT PREVENA INCISION MGT 13 (CANNISTER) ×1 IMPLANT
KIT SPINAL PRONEVIEW (KITS) ×1 IMPLANT
MANIFOLD NEPTUNE II (INSTRUMENTS) ×1 IMPLANT
MARKER SKIN DUAL TIP RULER LAB (MISCELLANEOUS) ×1 IMPLANT
NDL SAFETY ECLIP 18X1.5 (MISCELLANEOUS) ×1 IMPLANT
NS IRRIG 1000ML POUR BTL (IV SOLUTION) ×1 IMPLANT
NS IRRIG 500ML POUR BTL (IV SOLUTION) IMPLANT
PACK LAMINECTOMY ARMC (PACKS) ×1 IMPLANT
PAD ARMBOARD 7.5X6 YLW CONV (MISCELLANEOUS) ×1 IMPLANT
PUTTY DBX 5CC (Putty) IMPLANT
ROD RELINE-O 5.5X300 STRT NS (Rod) IMPLANT
ROD RELINE-O 5.5X300MM STRT (Rod) ×1 IMPLANT
SCREW LOCK RELINE 5.5 TULIP (Screw) IMPLANT
SCREW RELINE POLY 4.5X40MM (Screw) ×6 IMPLANT
SCREW RELINE-O 4.5X45MM POLY (Screw) IMPLANT
SCREW RLINE PLY 2S 40X4.5XPA (Screw) IMPLANT
SOLUTION IRRIG SURGIPHOR (IV SOLUTION) ×1 IMPLANT
SURGIFLO W/THROMBIN 8M KIT (HEMOSTASIS) ×1 IMPLANT
SUT ETHILON 3-0 FS-10 30 BLK (SUTURE) ×1
SUT V-LOC 90 ABS DVC 3-0 CL (SUTURE) IMPLANT
SUT VIC AB 0 CT1 27 (SUTURE) ×1
SUT VIC AB 0 CT1 27XCR 8 STRN (SUTURE) ×2 IMPLANT
SUT VIC AB 2-0 CT1 18 (SUTURE) ×2 IMPLANT
SUT VIC AB 2-0 CT1 27 (SUTURE) ×2
SUT VIC AB 2-0 CT1 TAPERPNT 27 (SUTURE) IMPLANT
SUTURE EHLN 3-0 FS-10 30 BLK (SUTURE) IMPLANT
SYR 10ML LL (SYRINGE) ×1 IMPLANT
SYR 30ML LL (SYRINGE) ×2 IMPLANT
TRAP FLUID SMOKE EVACUATOR (MISCELLANEOUS) ×1 IMPLANT
WATER STERILE IRR 1000ML POUR (IV SOLUTION) ×2 IMPLANT

## 2023-01-28 NOTE — Progress Notes (Signed)
Progress Note   Patient: Paula Garrett NGE:[id number] DOB: 1965/08/21 DOA: 01/24/2023     4 DOS: the patient was seen and examined on 01/28/2023   Brief hospital course: [name] is a 57 year old female with history of hypertension, depression, anxiety, history of tobacco use, GERD, who presents to the emergency department for chief concerns of chest pain and shortness of breath for 1 month.  Patient also has known multiple myeloma which has been followed as outpatient without requiring any treatment. Imaging study showed large soft tissue mass in left paraspinal soft tissue at T6-T7 with destruction of seventh rib and invasion of T6 and T7 vertebral bodies involving left posterior elements as well as epidural tumor and spinal canal with moderate spinal canal stenosis without frank cord compression or cord edema, acute/subacute compression fracture of T11 vertebral body with up to 30% vertebral body height and minimal bony retropulsion, possibly pathologic. Right occipital calvarium with small enhancing lesion, suspicious for additional metastatic or myeloma lesion. Patient has been seen by neurosurgery, started on IV steroids.   Surgery 8/13: 1. Posterior Segmental Instrumentation T4 to T9 2. Posterolateral arthrodesis from T4 to T9 3. Thoracic Laminotomy at T6 and T7   Principal Problem:   Mass of spinal cord (HCC) Active Problems:   Insomnia   Hypothyroidism   Obesity (BMI 30-39.9)   Tobacco use disorder   Benign essential hypertension   Barrett's esophagus without dysplasia   Rheumatoid factor positive   Asthma   Hyperlipidemia   OSA (obstructive sleep apnea)   Hypercalcemia   Smoldering multiple myeloma   Postoperative hypothyroidism   Anxiety   Shortness of breath   At risk for constipation   Multiple myeloma (HCC)   Spinal stenosis, thoracic region   Drug induced constipation   Palliative care encounter   Spinal instability of thoracic region   Assessment and  Plan: Multiple myeloma with paraspinal mass and thoracic spinal stenosis. Patient has been evaluated by neurosurgery as well as oncology, started on IV steroids.  Had a spinal surgery to stabilize thoracic spine today, continue pain medicine. Postoperatively hold off steroids and nicotine patch. Prophylactic heparin can be started tomorrow.   Severe constipation. Had a bowel movement after giving lactulose.     Hypercalcemia. Hyponatremia. Calcium level has normalized after giving fluids.   Worsening hyponatremia today, start salt tablets 1 g twice a day, started fluid restriction.   Essential hypertension. Continue amlodipine.      Subjective:  Patient has significant back pain postop.  No nausea vomiting.  Physical Exam: Vitals:   01/28/23 1408 01/28/23 1412 01/28/23 1433 01/28/23 1550  BP:  109/70 97/66 110/70  Pulse: 61 61 68   Resp:  16 16   Temp:  98 F (36.7 C) (!) 97.5 F (36.4 C)   TempSrc:   Axillary   SpO2: 92% 93% 100%   Weight:      Height:       General exam: Appears calm and comfortable  Respiratory system: Clear to auscultation. Respiratory effort normal. Cardiovascular system: S1 & S2 heard, RRR. No JVD, murmurs, rubs, gallops or clicks. No pedal edema. Gastrointestinal system: Abdomen is nondistended, soft and nontender. No organomegaly or masses felt. Normal bowel sounds heard. Central nervous system: Alert and oriented. No focal neurological deficits. Extremities: Symmetric 5 x 5 power. Skin: No rashes, lesions or ulcers Psychiatry: Judgement and insight appear normal. Mood & affect appropriate.    Data Reviewed:  Lab results reviewed.  Family Communication: None  Disposition: Status is: Inpatient Remains inpatient appropriate because: Severity of disease, postop.     Time spent: 35 minutes  Author: Marrion Coy, MD 01/28/2023 4:15 PM  For on call review [email or web address].

## 2023-01-28 NOTE — Plan of Care (Signed)

## 2023-01-28 NOTE — Anesthesia Procedure Notes (Signed)
Procedure Name: Intubation Date/Time: 01/28/2023 9:55 AM  Performed by: Berniece Pap, CRNAPre-anesthesia Checklist: Patient identified, Emergency Drugs available, Suction available and Patient being monitored Patient Re-evaluated:Patient Re-evaluated prior to induction Oxygen Delivery Method: Circle system utilized Preoxygenation: Pre-oxygenation with 100% oxygen Induction Type: IV induction Ventilation: Mask ventilation without difficulty Laryngoscope Size: McGraph and 4 Grade View: Grade II Tube type: Oral Tube size: 7.0 mm Number of attempts: 1 Airway Equipment and Method: Stylet and Oral airway Placement Confirmation: ETT inserted through vocal cords under direct vision, positive ETCO2 and breath sounds checked- equal and bilateral Secured at: 21 cm Tube secured with: Tape Dental Injury: Teeth and Oropharynx as per pre-operative assessment

## 2023-01-28 NOTE — Op Note (Addendum)
Indications: Paula Garrett is a 57 y.o. female with new onset of chest pain after which she had an extensive workup which demonstrated a lytic lesion at T6-7 causing severe compromise of the vertebral bodies as well as posterior elements on the unilateral left side at T6 and bilateral involvement at T7.  This in combination gave an unstable SIMS score.  She also was developing a coronal abnormality.  Given her instability and plan for radiation chemotherapy we performed open biopsy and surgical fixation/fusion of her thoracic spine.  Findings: Hematogenous tumor found at the posterior elements of T6, final path is pending  Preoperative Diagnosis:  Spinal instability of the thoracic spine Thoracic spinal tumor  Postoperative Diagnosis: same   EBL: 1000 ml IVF: see anesthesia record Drains: Subfascial Hemovac, Prevena Disposition: Extubated and Stable to PACU Complications: none  No foley catheter was placed.   Preoperative Note:   Risks of surgery discussed include: infection, bleeding, stroke, coma, death, paralysis, CSF leak, nerve/spinal cord injury, numbness, tingling, weakness, complex regional pain syndrome, recurrent stenosis and/or disc herniation, vascular injury, development of instability, neck/back pain, need for further surgery, persistent symptoms, development of deformity, and the risks of anesthesia. The patient understood these risks and agreed to proceed.  Operative Note:   OPERATIVE PROCEDURE:  1. Posterior Segmental Instrumentation T4 to T9 2. Posterolateral arthrodesis from T4 to T9 3. Thoracic Laminotomy at T6 and T7 4. Use of Allograft 5. Use of intraoperative navigation   OPERATIVE PROCEDURE:  After induction of general anesthesia, the patient was placed in the prone position on the operative table.  A midline incision was then planned using fluoroscopy.  A timeout was performed, and antibiotics given.  Next, a midline incision was planned.  The patient  was prepped and draped in the usual fashion.  All the pressure points were padded.  After a comprehensive timeout verifying the patient's name, MRN, planned procedure, and images local anesthetic was injected into the midline incision.  After giving this time to side and a midline incision was made.  It was opened sharply.  Was brought down to the level of the thoracic fascia.  Then a subperiosteal dissection was used from T4 to T9in order to expose the posterior spinal elements.  At each level we identified the spinous process brought the dissection down to the level of the lamina and expose the medial aspect of the transverse process.  At the most cranial level we took care not to violate the facet capsule.  After satisfactory exposure has been obtained our attention turned towards instrumentation.  At this point we fixated the navigation reference frame to the most inferior spinous process.  We fixed this into place and ensure that it was in good standing.  We then covered the patient in a sterile fashion and prepared for the three-dimensional C arm image acquisition.  We performed an image acquisition and afterwards checked the reference.  The reference was not well registered based off of multiple points and therefore we chose to obtain a new image.  Once imaging was adequate we then turned our attention towards placing pedicle screws.  First using a navigated drill guide we planned the for screw at the most caudal level.  On the right side we started at T9, we found a ideal starting point with a good trajectory down the pedicle.  Based off of the intraoperative imaging we sized the pedicle screw appropriately.  We then utilized the navigated drill guide to place a pilot hole.  After the pilot hole was placed we then palpated this with a hole probe.  Given the softness of the bone, we went straight to screw placement without tapping.  Utilizing a navigated screw we placed it into ideal position.  There was  good purchase.  We then repeated this process at T8, T7, T6, T5, and T4.  We then turned our attention to the contralateral side.  Using the same technique we placed pedicle screws at T9 and T8 on the left.  Because the pathologic level did not have adequate bone for purchase we skipped T6 and T7.  We then placed pedicle screws into T5 and T4.  Once our instrumentation was placed we then turned our attention towards the tumor.  Utilizing the navigation we identified a portion of the posterior spinal elements including the lamina and transverse process which looked to have a significant amount of tumor within them at both the T6 and T7 level.  Utilizing high-speed drill we decorticated these levels and were able to identify abnormal appearing tissue which likely represented the spinal mass.  We sent this for frozen specimens.  It came back as a hematogenous based mass but needed further evaluation to clearly identify its pathology.  Given that we had a adequate specimen we removed the rest of the local tumor within the exposure via laminotomy at T6 and T7.  Once we had good control we then irrigated copiously.  We obtained meticulous hemostasis.  We then utilized a high-speed drill to perform arthrodesis bilaterally at the T4-5 interspace, T5-6 interspace, T7-8 interspace, and T8-9 interspace.  We utilized a high-speed drill to perform right sided only arthrodesis at T6-7.  Then we placed a mixture of bone chips and DBX putty at each decorticated level to complete the arthrodesis.  We then obtained meticulous hemostasis.  We did a significant copious amount of irrigation.  We then placed a subfascial drain.  We closed in multiple layers.  V-Loc suture was used on the most superficial aspect.  The wound drain was secured into place with a nylon.  The Prevena was utilized over top of the wound after it had been cleaned.  The counts were correct, no immediate complications.  Implants used:   Implant Name Type  Inv. Item Serial No. Manufacturer Lot No. LRB No. Used Action  BONE [facility] CHIPS 20CC PCAN1/4 - [id number] Bone Implant BONE [facility] CHIPS [facility] PCAN1/4 [id number] [facility]  N/A 1 Implanted  PUTTY DBX 5CC - [id number] Putty PUTTY DBX 5CC [id number] [employer] TRANS[employer]ANT FNDN  N/A 1 Implanted  SCREW RELINE POLY 4.5X40MM - [id number] Screw SCREW RELINE POLY 4.5X40MM  [employer]  N/A 6 Implanted  SCREW RELINE-O 4.5X45MM POLY - [id number] Screw SCREW RELINE-O 4.5X45MM POLY  [employer]  N/A 4 Implanted  SCREW LOCK RELINE 5.5 [place] - [id number] Screw SCREW LOCK RELINE 5.5 [place]  [employer]  N/A 10 Implanted  ROD RELINE-O 5.5X300MM STRT - [id number] Rod ROD RELINE-O 5.5X300MM STRT  [employer]  N/A 1 Implanted    I performed the procedure without an Designer, television/film set.  Lovenia Kim, MD

## 2023-01-28 NOTE — Anesthesia Preprocedure Evaluation (Addendum)
Anesthesia Evaluation  Patient identified by MRN, date of birth, ID band Patient awake    Reviewed: Allergy & Precautions, NPO status , Patient's Chart, lab work & pertinent test results  History of Anesthesia Complications Negative for: history of anesthetic complications  Airway Mallampati: IV   Neck ROM: Full    Dental   Crowns :   Pulmonary asthma , COPD, Current Smoker (1 ppd) and Patient abstained from smoking.   Pulmonary exam normal breath sounds clear to auscultation       Cardiovascular hypertension, Normal cardiovascular exam Rhythm:Regular Rate:Normal  ECG 01/24/23: NSR; rightward axis; no STEMI   Neuro/Psych  Headaches PSYCHIATRIC DISORDERS Anxiety Depression       GI/Hepatic ,GERD  ,,S/p gastric bypass   Endo/Other  Hypothyroidism  Obesity; hyponatremia; prediabetes  Renal/GU negative Renal ROS     Musculoskeletal   Abdominal   Peds  Hematology  (+) Blood dyscrasia, anemia Multiple myeloma   Anesthesia Other Findings   Reproductive/Obstetrics                             Anesthesia Physical Anesthesia Plan  ASA: 3  Anesthesia Plan: General   Post-op Pain Management:    Induction: Intravenous  PONV Risk Score and Plan: 2 and Ondansetron, Dexamethasone and Treatment may vary due to age or medical condition  Airway Management Planned: Oral ETT  Additional Equipment:   Intra-op Plan:   Post-operative Plan: Extubation in OR  Informed Consent: I have reviewed the patients History and Physical, chart, labs and discussed the procedure including the risks, benefits and alternatives for the proposed anesthesia with the patient or authorized representative who has indicated his/her understanding and acceptance.     Dental advisory given  Plan Discussed with: CRNA  Anesthesia Plan Comments: (Patient consented for risks of anesthesia including but not limited to:  -  adverse reactions to medications - damage to eyes, teeth, lips or other oral mucosa - nerve damage due to positioning  - sore throat or hoarseness - damage to heart, brain, nerves, lungs, other parts of body or loss of life  Informed patient about role of CRNA in peri- and intra-operative care.  Patient voiced understanding.)        Anesthesia Quick Evaluation

## 2023-01-28 NOTE — Telephone Encounter (Signed)
Acknowledged.

## 2023-01-28 NOTE — Interval H&P Note (Signed)
History and Physical Interval Note:  01/28/2023 9:08 AM  Paula Garrett  has presented today for surgery, with the diagnosis of T4, T9 posterior spinal fusion.  The various methods of treatment have been discussed with the patient and family. After consideration of risks, benefits and other options for treatment, the patient has consented to  Procedure(s): POSTERIOR THORACIC FUSION T4 - T9 with biopsy (N/A) APPLICATION OF INTRAOPERATIVE CT SCAN (N/A) as a surgical intervention.  The patient's history has been reviewed, patient examined, no change in status, stable for surgery.  We discussed this case with the primary team as well as our oncology colleagues.  Given the likely response to chemotherapy and radiation were planning for prophylactic fixation of the spine with open biopsy.  I have reviewed the patient's chart and labs.  Questions were answered to the patient's satisfaction.     Loreen Freud

## 2023-01-28 NOTE — Transfer of Care (Signed)
Immediate Anesthesia Transfer of Care Note  Patient: Paula Garrett  Procedure(s) Performed: POSTERIOR THORACIC FUSION T4 - T9 with biopsy APPLICATION OF INTRAOPERATIVE CT SCAN  Patient Location: PACU  Anesthesia Type:General  Level of Consciousness: awake and alert   Airway & Oxygen Therapy: Patient Spontanous Breathing and Patient connected to nasal cannula oxygen  Post-op Assessment: Report given to RN and Post -op Vital signs reviewed and stable  Post vital signs: Reviewed and stable  Last Vitals:  Vitals Value Taken Time  BP 112/69 01/28/23 1338  Temp    Pulse 66 01/28/23 1341  Resp 15 01/28/23 1341  SpO2 97 % 01/28/23 1341  Vitals shown include unfiled device data.  Last Pain:  Vitals:   01/28/23 0801  TempSrc: Temporal  PainSc: 6       Patients Stated Pain Goal: 0 (01/27/23 0548)  Complications: No notable events documented.

## 2023-01-29 ENCOUNTER — Encounter: Payer: Self-pay | Admitting: Neurosurgery

## 2023-01-29 DIAGNOSIS — I10 Essential (primary) hypertension: Secondary | ICD-10-CM

## 2023-01-29 DIAGNOSIS — D62 Acute posthemorrhagic anemia: Secondary | ICD-10-CM

## 2023-01-29 DIAGNOSIS — G9589 Other specified diseases of spinal cord: Secondary | ICD-10-CM | POA: Diagnosis not present

## 2023-01-29 DIAGNOSIS — C9000 Multiple myeloma not having achieved remission: Secondary | ICD-10-CM | POA: Diagnosis not present

## 2023-01-29 DIAGNOSIS — N179 Acute kidney failure, unspecified: Secondary | ICD-10-CM

## 2023-01-29 DIAGNOSIS — F419 Anxiety disorder, unspecified: Secondary | ICD-10-CM

## 2023-01-29 DIAGNOSIS — E039 Hypothyroidism, unspecified: Secondary | ICD-10-CM

## 2023-01-29 DIAGNOSIS — K2270 Barrett's esophagus without dysplasia: Secondary | ICD-10-CM

## 2023-01-29 DIAGNOSIS — K5903 Drug induced constipation: Secondary | ICD-10-CM | POA: Diagnosis not present

## 2023-01-29 DIAGNOSIS — E669 Obesity, unspecified: Secondary | ICD-10-CM

## 2023-01-29 DIAGNOSIS — E871 Hypo-osmolality and hyponatremia: Secondary | ICD-10-CM

## 2023-01-29 MED ORDER — LACTULOSE 10 GM/15ML PO SOLN
30.0000 g | Freq: Every day | ORAL | Status: DC
  Administered 2023-01-29: 30 g via ORAL
  Filled 2023-01-29 (×2): qty 60

## 2023-01-29 NOTE — Assessment & Plan Note (Addendum)
Last BMI 36.45.

## 2023-01-29 NOTE — Plan of Care (Signed)

## 2023-01-29 NOTE — TOC Initial Note (Addendum)
Transition of Care [facility]) - Initial/Assessment Note    Patient Details  Name: Paula Garrett MRN: [id number] Date of Birth: 08/22/1965  Transition of Care [facility]) CM/SW Contact:    Margarito Liner, LCSW Phone Number: 01/29/2023, 12:07 PM  Clinical Narrative:  CSW met with patient. Brother and sister-in-law at bedside. CSW introduced role and explained that therapy recommendations would be discussed. Patient is agreeable to home health. No agency preference. Will start search. She is agreeable to RW. Since only Medicaid covers shower chairs, she will see if she can borrow one from a friend or get one at the hospice thrift store. No further concerns. CSW will continue to follow patient and her family for support and facilitate return home once stable. Family/friend will transport her home at discharge. She said she has several people she can call.            1:47 pm: [facility] declined. Iantha Fallen is checking her insurance. CSW ordered RW through [employer].  3:59 pm: [facility] can accept patient but copay will be $75 per visit. CSW is checking with other agencies to see if there is any price difference. So far [facility], [facility], [facility], [facility], and [facility] are unable to accept. Left messages for [facility] and [facility].  4:13 pm: [facility] is not in network. Frances Furbish said copays would be at least $75 per visit for them. Patient does not want to pay that much for home health and declined outpatient therapy. She reports doing well moving around at the hospital and has adequate family support. She will contact her PCP if she changes her mind after discharge. Patient confirmed her walker was delivered.  Expected Discharge Plan: Home w Home Health Services Barriers to Discharge: Continued Medical Work up   Patient Goals and CMS Choice   CMS Medicare.gov Compare Post Acute Care list provided to:: Patient        Expected Discharge Plan and Services     Post Acute Care Choice: Durable Medical Equipment, Home  Health Living arrangements for the past 2 months: Apartment                                      Prior Living Arrangements/Services Living arrangements for the past 2 months: Apartment Lives with:: Self Patient language and need for interpreter reviewed:: Yes Do you feel safe going back to the place where you live?: Yes      Need for Family Participation in Patient Care: Yes (Comment) Care giver support system in place?: Yes (comment)   Criminal Activity/Legal Involvement Pertinent to Current Situation/Hospitalization: No - Comment as needed  Activities of Daily Living Home Assistive Devices/Equipment: None ADL Screening (condition at time of admission) Patient's cognitive ability adequate to safely complete daily activities?: Yes Is the patient deaf or have difficulty hearing?: No Does the patient have difficulty seeing, even when wearing glasses/contacts?: No Does the patient have difficulty concentrating, remembering, or making decisions?: No Patient able to express need for assistance with ADLs?: Yes Does the patient have difficulty dressing or bathing?: No Independently performs ADLs?: Yes (appropriate for developmental age) Does the patient have difficulty walking or climbing stairs?: No Weakness of Legs: None Weakness of Arms/Hands: None  Permission Sought/Granted Permission sought to share information with : Facility Medical sales representative Permission granted to share information with : Yes, Verbal Permission Granted  Share Information with NAME: Alinda Money and Nita Sells [place]  Permission granted to share  info w AGENCY: Home health agencies  Permission granted to share info w Relationship: Brother and sister-in-law  Permission granted to share info w Contact Information: Alinda Money: [phone]: [phone]  Emotional Assessment Appearance:: Appears stated age Attitude/Demeanor/Rapport: Engaged, Gracious Affect (typically observed): Accepting, Appropriate, Calm,  Pleasant Orientation: : Oriented to Self, Oriented to Place, Oriented to  Time, Oriented to Situation Alcohol / Substance Use: Not Applicable Psych Involvement: No (comment)  Admission diagnosis:  Shortness of breath [R06.02] Patient Active Problem List   Diagnosis Date Noted   Spinal instability of 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/28/2023   Drug induced constipation 01/27/2023   Palliative care encounter 01/27/2023   Multiple myeloma (HCC) 01/25/2023   Spinal stenosis, 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   Shortness of breath 01/24/2023   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   At risk for constipation 01/24/2023   MDD (major depressive disorder), recurrent episode, moderate (HCC) 11/20/2022   SOB (shortness of breath) 11/01/2022   Chest pain 10/07/2022   Anxiety 10/07/2022   Iron deficiency anemia 12/14/2021   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   Goals of care, counseling/discussion 07/24/2020   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   Hypercalcemia 05/05/2020   Traumatic seroma of lower leg, right, subsequent encounter 05/05/2020   Traumatic hematoma of lower leg, right, subsequent encounter 02/09/2020   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   Colon polyps 07/26/2019   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   Prediabetes 09/10/2018   OSA (obstructive sleep apnea) 01/09/2018   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   Allergic rhinitis 11/20/2017   Hyperlipidemia 11/20/2017   Hot flashes 11/20/2017   Asthma 07/26/2017   B12 deficiency 07/26/2017   Snoring 05/01/2016   Rheumatoid factor positive 10/24/2015   Annual physical exam 01/12/2015   Chronic fatigue 06/23/2014   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   Barrett's esophagus without dysplasia 05/27/2014   History of adenomatous polyp of colon 05/27/2014   Anemia 01/28/2014   Morbid obesity (HCC) 01/28/2014   Low back pain 07/21/2013   Benign essential hypertension 01/20/2013   Vitamin D deficiency 12/02/2012   Chronic left shoulder pain 12/02/2012    Tobacco use disorder 06/09/2012   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   Insomnia 04/21/2012   Hypothyroidism 04/21/2012   Obesity (BMI 30-39.9) 04/21/2012   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   PCP:  Smitty Cords, DO Pharmacy:   [facility] [phone] [place], [place] - [address] AT [facility] [address] [place] [address] [place] [address] Phone: [phone] Fax: [phone]     Social Determinants of Health (SDOH) Social History: SDOH Screenings   Food Insecurity: No Food Insecurity (01/24/2023)  Housing: Low Risk  (01/24/2023)  Transportation Needs: No Transportation Needs (01/24/2023)  Utilities: Not At Risk (01/24/2023)  Alcohol Screen: Low Risk  (01/01/2023)  Depression (PHQ2-9): High Risk (01/01/2023)  Financial Resource Strain: Low Risk  (11/01/2022)  Physical Activity: Insufficiently Active (11/01/2022)  Social Connections: Socially Isolated (11/01/2022)  Stress: Stress Concern Present (11/01/2022)  Tobacco Use: High Risk (01/24/2023)   SDOH Interventions:     Readmission Risk Interventions     No data to display

## 2023-01-29 NOTE — Assessment & Plan Note (Addendum)
Lactulose made as needed.

## 2023-01-29 NOTE — Progress Notes (Signed)
Neurosurgery Progress Note  History: ZYANNE FACKELMAN is a 57 y.o s/p T4-9 PSF, T6-7 lamiotomy for tumor  POD1: Some trouble with the drain coming disconnected overnight. Pt feels her back is well controlled and denies any concerns  Physical Exam: Vitals:   01/29/23 0032 01/29/23 0600  BP: (!) 99/57 112/62  Pulse: 65 66  Resp: 20 19  Temp: 97.6 F (36.4 C) 97.7 F (36.5 C)  SpO2: 97% 97%    AA Ox3 CNI  Strength:5/5 throughout BLE HV 240 since surgery Incision covered with incisional wound VAC.  Data:  Other tests/results: Pathology pending   Assessment/Plan:  LINSY BALDASSARI is a 57 year old with a history of multiple myeloma found to have lytic lesions at T6 and T7 status post T4-9 posterior spinal fusion and T6-7 laminotomies.  - mobilize - pain control - ok for DVT prophylaxis - PTOT - will keep HV and wound vac in place - remainder of care per primary team. - Please call with any questions or concerns.   Manning Charity PA-C Department of Neurosurgery

## 2023-01-29 NOTE — Evaluation (Addendum)
Physical Therapy Evaluation Patient Details Name: Paula Garrett MRN: [id number] DOB: [birth year] Today's Date: 01/29/2023  History of Present Illness  Pt is a 57 year old female presenting to the ED with chest pain and SOB. Imaging study showed large soft tissue mass in left paraspinal soft tissue at T6-T7 with destruction of seventh rib and invasion of T6 and T7 vertebral bodies involving left posterior elements as well as epidural tumor and spinal canal with moderate spinal canal stenosis without frank cord compression or cord edema, acute/subacute compression fracture of T11 vertebral body with up to 30% vertebral body height and minimal bony retropulsion, possibly pathologic. Right occipital calvarium with small enhancing lesion, suspicious for additional metastatic or myeloma lesion.Pt is now s/p Posterior Segmental Instrumentation T4 to T9, Posterolateral arthrodesis from T4 to T9 ,Thoracic Laminotomy at T6 and T7 01/28/23.        PMH significant for HTN, depression, anxiety, history of tobacco use, GERD, known multiple myeloma followed as outpatient  Clinical Impression   Pt presents sitting in recliner with friends in the room. She currently lives alone in a 1st floor apartment with a level entry. PTA she was independent with all mobiltiy/ ADLs.   PT sensation and strength screening unremarkable. Pt performed 4 stage balance testing and showed difficulty in tandem stance (noteable sway/ instability/ reaching for UE support), increasing risk for falls. Pt performed sit<>stand and sit>supine with supervision for safety and to ensure drain remained in place. Pt tolerated ambulating ~227ft with CGA with RW/no AD, pt noted her walking feels the same as prior to surgery just slower.  Pt would benefit from continued skilled care to maximize functional abilities.       If plan is discharge home, recommend the following: A little help with walking and/or transfers;A little help with  bathing/dressing/bathroom;Help with stairs or ramp for entrance;Direct supervision/assist for medications management;Assistance with cooking/housework;Assist for transportation   Can travel by private vehicle        Equipment Recommendations Rolling walker (2 wheels)  Recommendations for Other Services       Functional Status Assessment Patient has had a recent decline in their functional status and demonstrates the ability to make significant improvements in function in a reasonable and predictable amount of time.     Precautions / Restrictions Precautions Precautions: Fall Restrictions Weight Bearing Restrictions: No      Mobility  Bed Mobility Overal bed mobility: Needs Assistance Bed Mobility: Sit to Supine       Sit to supine: HOB elevated, Supervision   General bed mobility comments: supervision for safety/ ensuring drain remains in place    Transfers Overall transfer level: Needs assistance Equipment used: None Transfers: Sit to/from Stand Sit to Stand: Supervision                Ambulation/Gait Ambulation/Gait assistance: Contact guard assist Gait Distance (Feet): 200 Feet Assistive device: Rolling walker (2 wheels), None Gait Pattern/deviations: Step-through pattern, WFL(Within Functional Limits)          Stairs            Wheelchair Mobility     Tilt Bed    Modified Rankin (Stroke Patients Only)       Balance Overall balance assessment: Needs assistance Sitting-balance support: Feet supported Sitting balance-Leahy Scale: Normal     Standing balance support: No upper extremity supported Standing balance-Leahy Scale: Good  Pertinent Vitals/Pain Pain Assessment Pain Assessment: No/denies pain Pain Score: 4  Pain Location: incision/ mid back Pain Descriptors / Indicators: Discomfort Pain Intervention(s): Monitored during session    Home Living Family/patient expects to be  discharged to:: Private residence Living Arrangements: Alone Available Help at Discharge: Friend(s);Available PRN/intermittently;Neighbor Type of Home: Apartment Home Access: Level entry       Home Layout: One level Home Equipment: Agricultural consultant (2 wheels) Additional Comments: RW was her mothers, pt unsure on how old it is    Prior Function Prior Level of Function : Independent/Modified Independent;Working/employed             Mobility Comments: Ambulated without AD ADLs Comments: indep in ADL/IADL, works part time     Extremity/Trunk Assessment   Upper Extremity Assessment Upper Extremity Assessment: Overall WFL for tasks assessed    Lower Extremity Assessment Lower Extremity Assessment: Overall WFL for tasks assessed (Gross LE MMT 4-5/5 (some pain noted with L knee flex), sensation intact)    Cervical / Trunk Assessment Cervical / Trunk Assessment: Back Surgery  Communication   Communication Communication: No apparent difficulties Cueing Techniques: Verbal cues  Cognition Arousal: Alert Behavior During Therapy: WFL for tasks assessed/performed Overall Cognitive Status: Within Functional Limits for tasks assessed                                          General Comments General comments (skin integrity, edema, etc.): 4 stage balance: unable to maintain tandem for >/=10s, noteable sway/ instability/ reaching for UE support    Exercises     Assessment/Plan    PT Assessment Patient needs continued PT services  PT Problem List Decreased range of motion;Decreased activity tolerance;Decreased balance;Decreased mobility;Decreased strength;Decreased coordination;Decreased knowledge of use of DME;Decreased safety awareness;Decreased knowledge of precautions       PT Treatment Interventions DME instruction;Gait training;Stair training;Functional mobility training;Therapeutic activities;Therapeutic exercise;Patient/family education;Neuromuscular  re-education;Balance training    PT Goals (Current goals can be found in the Care Plan section)  Acute Rehab PT Goals Patient Stated Goal: return home PT Goal Formulation: With patient Time For Goal Achievement: 02/12/23 Potential to Achieve Goals: Good    Frequency Min 1X/week     Co-evaluation               AM-PAC PT "6 Clicks" Mobility  Outcome Measure Help needed turning from your back to your side while in a flat bed without using bedrails?: A Little Help needed moving from lying on your back to sitting on the side of a flat bed without using bedrails?: A Little Help needed moving to and from a bed to a chair (including a wheelchair)?: A Little Help needed standing up from a chair using your arms (e.g., wheelchair or bedside chair)?: A Little Help needed to walk in hospital room?: A Little Help needed climbing 3-5 steps with a railing? : A Little 6 Click Score: 18    End of Session   Activity Tolerance: Patient tolerated treatment well Patient left: in bed;with call bell/phone within reach   PT Visit Diagnosis: Other abnormalities of gait and mobility (R26.89);Difficulty in walking, not elsewhere classified (R26.2)    Time: 1016-1040 PT Time Calculation (min) (ACUTE ONLY): 24 min   Charges:   PT Evaluation $PT Eval Low Complexity: 1 Low PT Treatments $Therapeutic Activity: 8-22 mins PT General Charges $$ ACUTE PT VISIT: 1 Visit   , PT, SPT 11:26 AM,01/29/23

## 2023-01-29 NOTE — Progress Notes (Signed)
Progress Note   Patient: Paula Garrett NGE:[id number] DOB: Apr 20, 1966 DOA: 01/24/2023     5 DOS: the patient was seen and examined on 01/29/2023   Brief hospital course: Paula Garrett is a 57 year old female with history of hypertension, depression, anxiety, history of tobacco use, GERD, who presents to the emergency department for chief concerns of chest pain and shortness of breath for 1 month.  Patient also has known multiple myeloma which has been followed as outpatient without requiring any treatment. Imaging study showed large soft tissue mass in left paraspinal soft tissue at T6-T7 with destruction of seventh rib and invasion of T6 and T7 vertebral bodies involving left posterior elements as well as epidural tumor and spinal canal with moderate spinal canal stenosis without frank cord compression or cord edema, acute/subacute compression fracture of T11 vertebral body with up to 30% vertebral body height and minimal bony retropulsion, possibly pathologic. Right occipital calvarium with small enhancing lesion, suspicious for additional metastatic or myeloma lesion. Patient has been seen by neurosurgery, started on IV steroids.   Surgery 8/13: 1. Posterior Segmental Instrumentation T4 to T9; 2. Posterolateral arthrodesis from T4 to T9; 3. Thoracic Laminotomy at T6 and T7.  8/14.  Patient walked to the bathroom and her drain was disconnected.  Blood was dripping out of the drain.  I contacted neurosurgery and okay to clean with alcohol swab and hooked back the drain.  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 Katrinka Blazing.  Patient had a posterior segmental instrumentation T4-T9, posterior lateral arthrodesis from T4-T9, thoracic laminectomy at T6 and T7.  Patient seen walking back from bathroom by herself.  Her drain disconnected on the walk to the bathroom.  Spoke with neurosurgery okay to clean with alcohol swab then hooked back up.  Multiple  myeloma (HCC) Follow-up biopsy from spine.  Follow-up outpatient oncology.  Drug induced constipation Added lactulose daily.  Hypercalcemia Last calcium 9.3  Acute blood loss anemia Hemoglobin went down to 8.9 with surgery yesterday and IV fluids.  Continue to monitor.  AKI (acute kidney injury) (HCC) Continue IV fluids today with creatinine rise up to 1.05 and yesterday was 0.65.  Hold Avapro.  Hyponatremia Patient on salt tablets.  Benign essential hypertension Continue Norvasc.  Hold Avapro with acute kidney injury.  Barrett's esophagus without dysplasia Continue Protonix  Anxiety Continue Lexapro and clonazepam  Obesity (BMI 30-39.9) BMI 31.09  Asthma Respiratory status stable  Hypothyroidism levothyroxine 250 mcg daily  Insomnia zolpidem 5 mg nightly as needed for sleep resumed        Subjective: Patient seen walking back from the bathroom.  Her drain from back surgery yesterday got disconnected.  Spoke with neurosurgery okay to reconnect with using alcohol swab to clean both edges.  Physical Exam: Vitals:   01/28/23 2009 01/29/23 0032 01/29/23 0600 01/29/23 0855  BP: 91/68 (!) 99/57 112/62 116/63  Pulse: 75 65 66 79  Resp: 18 20 19 15   Temp: (!) 97.5 F (36.4 C) 97.6 F (36.4 C) 97.7 F (36.5 C) 98.2 F (36.8 C)  TempSrc:   Oral   SpO2: 97% 97% 97% 98%  Weight:      Height:       Physical Exam HENT:     Head: Normocephalic.     Mouth/Throat:     Pharynx: No oropharyngeal exudate.  Eyes:     General: Lids are normal.     Conjunctiva/sclera: Conjunctivae normal.  Cardiovascular:  Rate and Rhythm: Normal rate and regular rhythm.     Heart sounds: Normal heart sounds, S1 normal and S2 normal.  Pulmonary:     Breath sounds: No decreased breath sounds, wheezing, rhonchi or rales.  Musculoskeletal:     Right lower leg: No swelling.     Left lower leg: No swelling.  Skin:    General: Skin is warm.     Findings: No rash.  Neurological:      Mental Status: She is alert and oriented to person, place, and time.     Data Reviewed: Sodium 128, creatinine 1.05, white blood cell count 12, hemoglobin 8.9   Disposition: Status is: Inpatient Remains inpatient appropriate because: Postoperative day 1 for neurosurgery.  Planned Discharge Destination: Home with Home Health    Time spent: 28 minutes  Author: Alford Highland, MD 01/29/2023 1:11 PM  For on call review [email or web address].

## 2023-01-29 NOTE — Evaluation (Signed)
Occupational Therapy Evaluation Patient Details Name: Paula Garrett MRN: [id number] DOB: Jul 11, 1965 Today's Date: 01/29/2023   History of Present Illness Pt is a 57 year old female presenting to the ED with chest pain and SOB. Imaging study showed large soft tissue mass in left paraspinal soft tissue at T6-T7 with destruction of seventh rib and invasion of T6 and T7 vertebral bodies involving left posterior elements as well as epidural tumor and spinal canal with moderate spinal canal stenosis without frank cord compression or cord edema, acute/subacute compression fracture of T11 vertebral body with up to 30% vertebral body height and minimal bony retropulsion, possibly pathologic. Right occipital calvarium with small enhancing lesion, suspicious for additional metastatic or myeloma lesion.Pt is now s/p Posterior Segmental Instrumentation T4 to T9, Posterolateral arthrodesis from T4 to T9 ,Thoracic Laminotomy at T6 and T7 01/28/23        PMH significant for epression, anxiety, history of tobacco use, GERD, known multiple myeloma followed as outpatient   Clinical Impression   Chart reviewed,  pt greeted in bed, agreeable to OT evaluation. Pt is alert and oriented x4. PTA Pt is indep in ADL/IADL, amb with no AD. Pt completes ADL at a supervision level with amb/standing, set up in seated. Pt presents with deficits in activity tolerance, knowledge of DME affecting safe and optimal ADL completion. Pt will benefit from OT to address deficits and to faciliate return to PLOF. Pt will also benefit from ongoing education re: ADL completion with DME/ adaptive equipment. OT will follow acutely.       If plan is discharge home, recommend the following: A little help with walking and/or transfers;A little help with bathing/dressing/bathroom;Assist for transportation;Help with stairs or ramp for entrance;Assistance with cooking/housework    Functional Status Assessment  Patient has had a recent decline in their  functional status and demonstrates the ability to make significant improvements in function in a reasonable and predictable amount of time.  Equipment Recommendations  Tub/shower bench    Recommendations for Other Services       Precautions / Restrictions Precautions Precautions: Fall Restrictions Weight Bearing Restrictions: No      Mobility Bed Mobility Overal bed mobility: Needs Assistance Bed Mobility: Supine to Sit     Supine to sit: Supervision, HOB elevated     General bed mobility comments: no vcs for body mechanics needed    Transfers Overall transfer level: Needs assistance Equipment used: None Transfers: Sit to/from Stand Sit to Stand: Supervision                  Balance Overall balance assessment: Needs assistance Sitting-balance support: Feet supported Sitting balance-Leahy Scale: Normal     Standing balance support: No upper extremity supported Standing balance-Leahy Scale: Good                             ADL either performed or assessed with clinical judgement   ADL Overall ADL's : Needs assistance/impaired Eating/Feeding: Set up   Grooming: Wash/dry hands;Wash/dry face;Supervision/safety Grooming Details (indicate cue type and reason): supervision standing, set up sitting             Lower Body Dressing: Minimal assistance Lower Body Dressing Details (indicate cue type and reason): simulated, anticipate MIN A for pants Toilet Transfer: Supervision/safety;Contact guard assist;Ambulation Toilet Transfer Details (indicate cue type and reason): STS from toilet with no use of grab bars Toileting- Clothing Manipulation and Hygiene: Supervision/safety;Sitting/lateral lean  Functional mobility during ADLs: Supervision/safety;Contact guard assist (CGA for drains/lines, approx 20' in room)       Vision Patient Visual Report: No change from baseline              Pertinent Vitals/Pain Pain Assessment Pain  Assessment: 0-10 Pain Score: 4  Pain Location: incision Pain Descriptors / Indicators: Discomfort Pain Intervention(s): Monitored during session     Extremity/Trunk Assessment Upper Extremity Assessment Upper Extremity Assessment: Overall WFL for tasks assessed (MMT not formally assessed)   Lower Extremity Assessment Lower Extremity Assessment: Overall WFL for tasks assessed   Cervical / Trunk Assessment Cervical / Trunk Assessment: Back Surgery   Communication Communication Communication: No apparent difficulties Cueing Techniques: Verbal cues   Cognition Arousal: Alert Behavior During Therapy: WFL for tasks assessed/performed Overall Cognitive Status: Within Functional Limits for tasks assessed                                       General Comments  vss throughout, all drains/wound vac/IV intact pre/post session    Exercises Other Exercises Other Exercises: edu re: role of OT, role of rehab, discharge recommendations, ADL completion at home, DME use        Home Living Family/patient expects to be discharged to:: Private residence Living Arrangements: Alone Available Help at Discharge: Friend(s);Available PRN/intermittently (says can get 24/7 if needed post discharge) Type of Home: Apartment Home Access: Level entry     Home Layout: One level     Bathroom Shower/Tub: Tub/shower unit;Curtain   Bathroom Toilet: Standard Bathroom Accessibility: Yes   Home Equipment: Agricultural consultant (2 wheels)          Prior Functioning/Environment Prior Level of Function : Independent/Modified Independent;History of Falls (last six months);Working/employed             Mobility Comments: amb with no AD, fell at the pool about a month ago ADLs Comments: indep in ADL/IADL, works part time        OT Problem List: Decreased activity tolerance;Decreased knowledge of precautions;Decreased knowledge of use of DME or AE      OT Treatment/Interventions:  Self-care/ADL training;Therapeutic exercise;DME and/or AE instruction;Patient/family education;Therapeutic activities    OT Goals(Current goals can be found in the care plan section) Acute Rehab OT Goals Patient Stated Goal: go home OT Goal Formulation: With patient Time For Goal Achievement: 02/12/23 Potential to Achieve Goals: Good ADL Goals Pt Will Perform Grooming: with modified independence;standing Pt Will Perform Lower Body Dressing: with modified independence;with adaptive equipment Pt Will Transfer to Toilet: with modified independence;ambulating Pt Will Perform Toileting - Clothing Manipulation and hygiene: with modified independence;sit to/from stand  OT Frequency: Min 1X/week    Co-evaluation              AM-PAC OT "6 Clicks" Daily Activity     Outcome Measure Help from another person eating meals?: None Help from another person taking care of personal grooming?: None Help from another person toileting, which includes using toliet, bedpan, or urinal?: None Help from another person bathing (including washing, rinsing, drying)?: A Little Help from another person to put on and taking off regular upper body clothing?: None Help from another person to put on and taking off regular lower body clothing?: A Little 6 Click Score: 22   End of Session Equipment Utilized During Treatment: Rolling walker (2 wheels) Nurse Communication: Mobility status  Activity Tolerance: Patient tolerated  treatment well Patient left: in chair;with call bell/phone within reach  OT Visit Diagnosis: Other abnormalities of gait and mobility (R26.89)                Time: 8295-6213 OT Time Calculation (min): 24 min Charges:  OT General Charges $OT Visit: 1 Visit OT Evaluation $OT Eval Moderate Complexity: 1 Mod  Oleta Mouse, OTD OTR/L  01/29/23, 10:22 AM

## 2023-01-29 NOTE — Assessment & Plan Note (Addendum)
Watch creatinine with IV Lasix.  (Creatinine went up to 1.05 from a baseline creatinine of 0.65.) last creatinine 1.04 with diuresis.

## 2023-01-29 NOTE — Anesthesia Postprocedure Evaluation (Signed)
Anesthesia Post Note  Patient: Paula Garrett  Procedure(s) Performed: POSTERIOR THORACIC FUSION T4 - T9 with biopsy APPLICATION OF INTRAOPERATIVE CT SCAN  Patient location during evaluation: PACU Anesthesia Type: General Level of consciousness: awake and alert Pain management: pain level controlled Vital Signs Assessment: post-procedure vital signs reviewed and stable Respiratory status: spontaneous breathing, nonlabored ventilation and respiratory function stable Cardiovascular status: blood pressure returned to baseline and stable Postop Assessment: no apparent nausea or vomiting Anesthetic complications: no   No notable events documented.   Last Vitals:  Vitals:   01/29/23 0600 01/29/23 0855  BP: 112/62 116/63  Pulse: 66 79  Resp: 19 15  Temp: 36.5 C 36.8 C  SpO2: 97% 98%    Last Pain:  Vitals:   01/29/23 1009  TempSrc:   PainSc: 4                  Foye Deer

## 2023-01-29 NOTE — Assessment & Plan Note (Addendum)
Patient on salt tablets.  Sodium slowly rising to 132.

## 2023-01-29 NOTE — Assessment & Plan Note (Addendum)
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  Oncology message me about setting up a bone marrow biopsy for potentially tomorrow.

## 2023-01-29 NOTE — Assessment & Plan Note (Addendum)
Hemoglobin 8.9 today.

## 2023-01-30 ENCOUNTER — Inpatient Hospital Stay: Payer: Managed Care, Other (non HMO)

## 2023-01-30 DIAGNOSIS — E8770 Fluid overload, unspecified: Secondary | ICD-10-CM

## 2023-01-30 DIAGNOSIS — K5903 Drug induced constipation: Secondary | ICD-10-CM | POA: Diagnosis not present

## 2023-01-30 DIAGNOSIS — G9589 Other specified diseases of spinal cord: Secondary | ICD-10-CM | POA: Diagnosis not present

## 2023-01-30 DIAGNOSIS — C9000 Multiple myeloma not having achieved remission: Secondary | ICD-10-CM | POA: Diagnosis not present

## 2023-01-30 LAB — BASIC METABOLIC PANEL
Anion gap: 7 (ref 5–15)
BUN: 24 mg/dL — ABNORMAL HIGH (ref 6–20)
CO2: 22 mmol/L (ref 22–32)
Calcium: 9.7 mg/dL (ref 8.9–10.3)
Chloride: 99 mmol/L (ref 98–111)
Creatinine, Ser: 0.97 mg/dL (ref 0.44–1.00)
GFR, Estimated: >60 mL/min (ref >60)
Glucose, Bld: 107 mg/dL — ABNORMAL HIGH (ref 70–99)
Potassium: 3.8 mmol/L (ref 3.5–5.1)
Sodium: 130 mmol/L — ABNORMAL LOW (ref 135–145)

## 2023-01-30 LAB — CBC
HCT: 28.4 % — ABNORMAL LOW (ref 36.0–46.0)
Hemoglobin: 9.3 g/dL — ABNORMAL LOW (ref 12.0–15.0)
MCH: 31.6 pg (ref 26.0–34.0)
MCHC: 32.7 g/dL (ref 30.0–36.0)
MCV: 96.6 fL (ref 80.0–100.0)
Platelets: 226 10*3/uL (ref 150–400)
RBC: 2.94 MIL/uL — ABNORMAL LOW (ref 3.87–5.11)
RDW: 13.8 % (ref 11.5–15.5)
WBC: 9.4 10*3/uL (ref 4.0–10.5)
nRBC: 0 % (ref 0.0–0.2)

## 2023-01-30 MED ORDER — FUROSEMIDE 10 MG/ML IJ SOLN
40.0000 mg | Freq: Once | INTRAMUSCULAR | Status: DC
  Filled 2023-01-30: qty 4

## 2023-01-30 MED ORDER — ALBUTEROL SULFATE (2.5 MG/3ML) 0.083% IN NEBU
2.5000 mg | INHALATION_SOLUTION | Freq: Four times a day (QID) | RESPIRATORY_TRACT | Status: DC
  Administered 2023-01-30 – 2023-02-01 (×8): 2.5 mg via RESPIRATORY_TRACT
  Filled 2023-01-30 (×8): qty 3

## 2023-01-30 MED ORDER — ALBUTEROL SULFATE (2.5 MG/3ML) 0.083% IN NEBU
2.5000 mg | INHALATION_SOLUTION | RESPIRATORY_TRACT | Status: DC | PRN
  Administered 2023-01-30: 2.5 mg via RESPIRATORY_TRACT
  Filled 2023-01-30: qty 3

## 2023-01-30 MED ORDER — FUROSEMIDE 10 MG/ML IJ SOLN
20.0000 mg | Freq: Once | INTRAMUSCULAR | Status: AC
  Administered 2023-01-30: 20 mg via INTRAVENOUS
  Filled 2023-01-30: qty 2

## 2023-01-30 MED ORDER — CYCLOBENZAPRINE HCL 10 MG PO TABS
10.0000 mg | ORAL_TABLET | Freq: Three times a day (TID) | ORAL | Status: DC
  Administered 2023-01-30 – 2023-02-03 (×14): 10 mg via ORAL
  Filled 2023-01-30 (×14): qty 1

## 2023-01-30 NOTE — Progress Notes (Signed)
Neurosurgery Progress Note  History: PERIS SHAFIQ is a 57 y.o s/p T4-9 PSF, T6-7 lamiotomy for tumor  POD2: Pt reporting more back pain this morning POD1: Some trouble with the drain coming disconnected overnight. Pt feels her back is well controlled and denies any concerns  Physical Exam: Vitals:   01/29/23 1956 01/30/23 0435  BP: 99/60 131/72  Pulse: 77 78  Resp: 18 20  Temp: 97.9 F (36.6 C) 98.2 F (36.8 C)  SpO2: 96% 92%    AA Ox3 CNI  Strength:5/5 throughout BLE HV 50  Incision covered with incisional wound VAC.  Data:  Other tests/results: Pathology pending   Assessment/Plan:  CHERON RODRIGUES is a 57 year old with a history of multiple myeloma found to have lytic lesions at T6 and T7 status post T4-9 posterior spinal fusion and T6-7 laminotomies.  - mobilize - pain control - ok for DVT prophylaxis - PTOT - will likely remove HV this afternoon. Plan to keep wound vac in place for 3-5 days post-op depending on dispo planning.  - remainder of care per primary team. - Please call with any questions or concerns.   Manning Charity PA-C Department of Neurosurgery

## 2023-01-30 NOTE — Progress Notes (Signed)
Physical Therapy Treatment Patient Details Name: Paula Garrett MRN: [id number] DOB: 11/12/1965 Today's Date: 01/30/2023   History of Present Illness Pt is a 57 year old female presenting to the ED with chest pain and SOB. Imaging study showed large soft tissue mass in left paraspinal soft tissue at T6-T7 with destruction of seventh rib and invasion of T6 and T7 vertebral bodies involving left posterior elements as well as epidural tumor and spinal canal with moderate spinal canal stenosis without frank cord compression or cord edema, acute/subacute compression fracture of T11 vertebral body with up to 30% vertebral body height and minimal bony retropulsion, possibly pathologic. Right occipital calvarium with small enhancing lesion, suspicious for additional metastatic or myeloma lesion.Pt is now s/p Posterior Segmental Instrumentation T4 to T9, Posterolateral arthrodesis from T4 to T9 ,Thoracic Laminotomy at T6 and T7 01/28/23.        PMH significant for HTN, depression, anxiety, history of tobacco use, GERD, known multiple myeloma followed as outpatient    PT Comments  Patient agreeable to PT. She reports having pain medication not long ago and wanted to walk in the hallway. Patient walked several feet without device (per her request) with narrow base of support and generally unsteady. Encouraged patient to use rolling walker for safety and improved balance and gait pattern. Patient still required intermittent cues for negotiation of rolling walker and for safety due to mild veering to the right. She walked a lap in the hallway, no shortness of breath is noted. Recommend to continue PT to maximize independence and decrease caregiver burden.    If plan is discharge home, recommend the following: A little help with walking and/or transfers;A little help with bathing/dressing/bathroom;Help with stairs or ramp for entrance;Direct supervision/assist for medications management;Assistance with  cooking/housework;Assist for transportation   Can travel by private vehicle        Equipment Recommendations  Rolling walker (2 wheels)    Recommendations for Other Services       Precautions / Restrictions Precautions Precautions: Fall Precaution Comments: wound vac, drain Restrictions Weight Bearing Restrictions: No     Mobility  Bed Mobility Overal bed mobility: Needs Assistance Bed Mobility: Supine to Sit     Supine to sit: Supervision, HOB elevated     General bed mobility comments: cues for logroll technique for comfort    Transfers Overall transfer level: Needs assistance Equipment used: None Transfers: Sit to/from Stand Sit to Stand: Supervision           General transfer comment: supervision for safety.    Ambulation/Gait Ambulation/Gait assistance: Contact guard assist Gait Distance (Feet): 200 Feet Assistive device: Rolling walker (2 wheels) Gait Pattern/deviations: Step-through pattern, Drifts right/left Gait velocity: decreased     General Gait Details: initially patient requesting to walk without device. patient reports feeling mildly unsteady after the first few feet and requested to use rolling walker which is encouraged for safety and fall prevention. occasional safety cues required for negotiation around obstacles and to avoid veering (mild and to the right)   Stairs             Wheelchair Mobility     Tilt Bed    Modified Rankin (Stroke Patients Only)       Balance Overall balance assessment: Needs assistance Sitting-balance support: Feet supported Sitting balance-Leahy Scale: Normal     Standing balance support: No upper extremity supported Standing balance-Leahy Scale: Good  Cognition Arousal: Alert Behavior During Therapy: WFL for tasks assessed/performed Overall Cognitive Status: Within Functional Limits for tasks assessed                                           Exercises      General Comments General comments (skin integrity, edema, etc.): encourage short distance ambulation with rolling walker and being out of bed to chair routinely for upright conditioning as patient reports her chest sounds crackled      Pertinent Vitals/Pain Pain Assessment Pain Assessment: Faces Faces Pain Scale: Hurts little more Pain Location: back Pain Descriptors / Indicators: Sore Pain Intervention(s): Limited activity within patient's tolerance, Monitored during session, Premedicated before session, Repositioned    Home Living                          Prior Function            PT Goals (current goals can now be found in the care plan section) Acute Rehab PT Goals Patient Stated Goal: to go home PT Goal Formulation: With patient Time For Goal Achievement: 02/12/23 Potential to Achieve Goals: Good Progress towards PT goals: Progressing toward goals    Frequency    Min 1X/week      PT Plan      Co-evaluation              AM-PAC PT "6 Clicks" Mobility   Outcome Measure  Help needed turning from your back to your side while in a flat bed without using bedrails?: A Little Help needed moving from lying on your back to sitting on the side of a flat bed without using bedrails?: A Little Help needed moving to and from a bed to a chair (including a wheelchair)?: A Little Help needed standing up from a chair using your arms (e.g., wheelchair or bedside chair)?: A Little Help needed to walk in hospital room?: A Little Help needed climbing 3-5 steps with a railing? : A Little 6 Click Score: 18    End of Session   Activity Tolerance: Patient tolerated treatment well Patient left: in chair;with call bell/phone within reach;with chair alarm set   PT Visit Diagnosis: Other abnormalities of gait and mobility (R26.89);Difficulty in walking, not elsewhere classified (R26.2)     Time: 1117-1130 PT Time Calculation (min)  (ACUTE ONLY): 13 min  Charges:    $Therapeutic Activity: 8-22 mins PT General Charges $$ ACUTE PT VISIT: 1 Visit                    Donna Bernard, PT, MPT    Ina Homes 01/30/2023, 11:50 AM

## 2023-01-30 NOTE — Progress Notes (Signed)
Progress Note   Patient: Paula Garrett UJW:[id number] DOB: Jun 14, 1966 DOA: 01/24/2023     6 DOS: the patient was seen and examined on 01/30/2023   Brief hospital course: [name] is a 57 year old female with history of hypertension, depression, anxiety, history of tobacco use, GERD, who presents to the emergency department for chief concerns of chest pain and shortness of breath for 1 month.  Patient also has known multiple myeloma which has been followed as outpatient without requiring any treatment. Imaging study showed large soft tissue mass in left paraspinal soft tissue at T6-T7 with destruction of seventh rib and invasion of T6 and T7 vertebral bodies involving left posterior elements as well as epidural tumor and spinal canal with moderate spinal canal stenosis without frank cord compression or cord edema, acute/subacute compression fracture of T11 vertebral body with up to 30% vertebral body height and minimal bony retropulsion, possibly pathologic. Right occipital calvarium with small enhancing lesion, suspicious for additional metastatic or myeloma lesion. Patient has been seen by neurosurgery, started on IV steroids.   Surgery 8/13: 1. Posterior Segmental Instrumentation T4 to T9; 2. Posterolateral arthrodesis from T4 to T9; 3. Thoracic Laminotomy at T6 and T7.  8/14.  Patient walked to the bathroom and her drain was disconnected.  Blood was dripping out of the drain.  I contacted neurosurgery and okay to clean with alcohol swab and hooked back the drain. 8/15.  Patient having a lot more pain today.  Having some cough.  Chest x-ray concerning for fluid overload.  IV fluids discontinued this morning.  Give a dose of Lasix.  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 Katrinka Blazing.  Patient had a posterior segmental instrumentation T4-T9, posterior lateral arthrodesis from T4-T9, thoracic laminectomy at T6 and T7.  Patient with more pain  today.  Pain control and muscle relaxer added.  Case discussed with neurosurgical team and they would like to watch a few more days with the wound VAC here in the hospital.  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  Fluid overload Discontinued IV fluids.  Chest x-ray looks more consistent with fluid overload.  Will give dose of Lasix.  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  Follow-up outpatient oncology.  Drug induced constipation Had diarrhea yesterday.  Will discontinue lactulose and continue other medications for constipation.  Hypercalcemia Last calcium 9.7  Acute blood loss anemia Hemoglobin 9.3 today.  Continue to monitor.  AKI (acute kidney injury) (HCC) Creatinine improved to 0.97.  Chest x-ray concerning for fluid overload.  IV fluids discontinued.  1 dose of Lasix ordered.  Check creatinine tomorrow.  Hyponatremia Patient on salt tablets.  Sodium slowly rising to 130.  Benign essential hypertension Continue Norvasc.  Hold Avapro with acute kidney injury.  Barrett's esophagus without dysplasia Continue Protonix  Anxiety Continue Lexapro and clonazepam  Obesity (BMI 30-39.9) BMI 31.09  Asthma Will make nebulizer standing dose.  Hypothyroidism Continue levothyroxine.  Insomnia As needed Ambien        Subjective: Patient in a lot more pain today with regards to her back.  More uncomfortable.  Also states she is unable to get the interpreter spirometer up to what she was breathing yesterday.  Some shortness of breath and cough.  Physical Exam: Vitals:   01/29/23 1956 01/30/23 0435 01/30/23 0855 01/30/23 1221  BP: 99/60 131/72 131/62   Pulse: 77 78 95 85  Resp: 18 20 17 16   Temp:  97.9 F (36.6 C) 98.2 F (36.8 C) 99.1 F (37.3 C)   TempSrc: Oral  Oral   SpO2: 96% 92% 90% 90%  Weight:      Height:       Physical Exam HENT:     Head: Normocephalic.     Mouth/Throat:     Pharynx: No oropharyngeal exudate.  Eyes:      General: Lids are normal.     Conjunctiva/sclera: Conjunctivae normal.  Cardiovascular:     Rate and Rhythm: Normal rate and regular rhythm.     Heart sounds: Normal heart sounds, S1 normal and S2 normal.  Pulmonary:     Breath sounds: Examination of the right-lower field reveals decreased breath sounds and rhonchi. Examination of the left-lower field reveals decreased breath sounds and rhonchi. Decreased breath sounds and rhonchi present. No wheezing or rales.  Abdominal:     Palpations: Abdomen is soft.     Tenderness: There is no abdominal tenderness.  Musculoskeletal:     Right lower leg: No swelling.     Left lower leg: No swelling.  Skin:    General: Skin is warm.     Findings: No rash.  Neurological:     Mental Status: She is alert and oriented to person, place, and time.     Data Reviewed: White blood cell count 9.4, hemoglobin 9.3, platelet count 226, sodium 130, potassium 3.8, creatinine 0.97 Disposition: Status is: Inpatient Remains inpatient appropriate because: Patient with a lot more pain today.  Neurosurgical team wants to have the wound VAC on potentially through the weekend.  Planned Discharge Destination: Home    Time spent: 28 minutes Case discussed with neurosurgical team.  Author: Alford Highland, MD 01/30/2023 2:27 PM  For on call review [email or web address].

## 2023-01-30 NOTE — Plan of Care (Signed)

## 2023-01-30 NOTE — Progress Notes (Addendum)
Occupational Therapy Treatment Patient Details Name: Paula Garrett MRN: [id number] DOB: 07-24-1965 Today's Date: 01/30/2023   History of present illness Pt is a 57 year old female presenting to the ED with chest pain and SOB. Imaging study showed large soft tissue mass in left paraspinal soft tissue at T6-T7 with destruction of seventh rib and invasion of T6 and T7 vertebral bodies involving left posterior elements as well as epidural tumor and spinal canal with moderate spinal canal stenosis without frank cord compression or cord edema, acute/subacute compression fracture of T11 vertebral body with up to 30% vertebral body height and minimal bony retropulsion, possibly pathologic. Right occipital calvarium with small enhancing lesion, suspicious for additional metastatic or myeloma lesion.Pt is now s/p Posterior Segmental Instrumentation T4 to T9, Posterolateral arthrodesis from T4 to T9 ,Thoracic Laminotomy at T6 and T7 01/28/23.        PMH significant for HTN, depression, anxiety, history of tobacco use, GERD, known multiple myeloma followed as outpatient   OT comments  Chart reviewed to date, pt greeted in bed, agreeable to OT tx session targeting improved LB dressing. Pt reports increased soreness in her back on this date, 8/10; reports she was pre medicated. Pt with increased sway while amb to bathroom requiring intermittent CGA, MIN A for LB dressing with use of AE with fair carry over. All lines/leads/drain/vac intact pre/post session. Pt is making progress towards goals, will continue to benefit from skilled OT to address deficits. Pt is left in bed, all needs met. OT will follow acutely.       If plan is discharge home, recommend the following:  A little help with walking and/or transfers;A little help with bathing/dressing/bathroom;Assist for transportation;Help with stairs or ramp for entrance;Assistance with cooking/housework   Equipment Recommendations  Tub/shower bench     Recommendations for Other Services      Precautions / Restrictions Precautions Precautions: Fall Precaution Comments: wound vac, drain Restrictions Weight Bearing Restrictions: No       Mobility Bed Mobility Overal bed mobility: Needs Assistance Bed Mobility: Supine to Sit, Sit to Supine     Supine to sit: Supervision, HOB elevated Sit to supine: Supervision, HOB elevated   General bed mobility comments: intermittent vcs for body mechanics    Transfers Overall transfer level: Needs assistance Equipment used: None Transfers: Sit to/from Stand Sit to Stand: Supervision                 Balance Overall balance assessment: Needs assistance Sitting-balance support: Feet supported Sitting balance-Leahy Scale: Normal     Standing balance support: No upper extremity supported Standing balance-Leahy Scale: Good                             ADL either performed or assessed with clinical judgement   ADL Overall ADL's : Needs assistance/impaired     Grooming: Wash/dry hands;Sitting;Set up               Lower Body Dressing: Minimal assistance Lower Body Dressing Details (indicate cue type and reason): provided education re: LB dressing with use of sock aid/reacher; fair carry over Toilet Transfer: Supervision/safety;Ambulation;Regular Toilet   Toileting- Architect and Hygiene: Supervision/safety;Sitting/lateral lean Toileting - Clothing Manipulation Details (indicate cue type and reason): continent urination on toilet     Functional mobility during ADLs: Supervision/safety;Contact guard assist (intermittent CGA, pt with increased sway as compared to yesterday, household distances)  Cognition Arousal: Alert, Lethargic (recently got pain meds) Behavior During Therapy: WFL for tasks assessed/performed Overall Cognitive Status: Within Functional Limits for tasks assessed                                           Exercises Other Exercises Other Exercises: edu re: use of AE for LB dressing, IS use, importance of progressing mobility with staff assist       General Comments pt with noted ?chest crackling, encouraged use of IS with pt with good demo; spo2 93% on RA, HR 100 bpm    Pertinent Vitals/ Pain       Pain Assessment Pain Assessment: 0-10 Pain Score: 8  Pain Location: back Pain Descriptors / Indicators: Sore Pain Intervention(s): Premedicated before session, Monitored during session, Limited activity within patient's tolerance, Repositioned   Frequency  Min 1X/week        Progress Toward Goals  OT Goals(current goals can now be found in the care plan section)  Progress towards OT goals: Progressing toward goals      AM-PAC OT "6 Clicks" Daily Activity     Outcome Measure   Help from another person eating meals?: None Help from another person taking care of personal grooming?: None Help from another person toileting, which includes using toliet, bedpan, or urinal?: None Help from another person bathing (including washing, rinsing, drying)?: A Little Help from another person to put on and taking off regular upper body clothing?: None Help from another person to put on and taking off regular lower body clothing?: A Little 6 Click Score: 22    End of Session    OT Visit Diagnosis: Other abnormalities of gait and mobility (R26.89)   Activity Tolerance Patient tolerated treatment well   Patient Left in bed;with call bell/phone within reach;with bed alarm set   Nurse Communication Mobility status        Time: 2951-8841 OT Time Calculation (min): 15 min  Charges: OT General Charges $OT Visit: 1 Visit OT Treatments $Self Care/Home Management : 8-22 mins  Oleta Mouse, OTD OTR/L  01/30/23, 11:00 AM

## 2023-01-30 NOTE — Assessment & Plan Note (Addendum)
Chest x-ray concerning for fluid overload.  IV Lasix twice daily.  Echocardiogram shows normal ejection fraction.

## 2023-01-31 ENCOUNTER — Inpatient Hospital Stay: Payer: Managed Care, Other (non HMO)

## 2023-01-31 ENCOUNTER — Inpatient Hospital Stay (HOSPITAL_COMMUNITY)
Admit: 2023-01-31 | Discharge: 2023-01-31 | Disposition: A | Discharge status: Home / Self Care | Payer: Managed Care, Other (non HMO) | Attending: Internal Medicine | Admitting: Internal Medicine

## 2023-01-31 DIAGNOSIS — R0609 Other forms of dyspnea: Secondary | ICD-10-CM | POA: Diagnosis not present

## 2023-01-31 DIAGNOSIS — J9601 Acute respiratory failure with hypoxia: Secondary | ICD-10-CM

## 2023-01-31 DIAGNOSIS — C9000 Multiple myeloma not having achieved remission: Secondary | ICD-10-CM | POA: Diagnosis not present

## 2023-01-31 DIAGNOSIS — E8779 Other fluid overload: Secondary | ICD-10-CM

## 2023-01-31 DIAGNOSIS — Z515 Encounter for palliative care: Secondary | ICD-10-CM | POA: Diagnosis not present

## 2023-01-31 DIAGNOSIS — G9589 Other specified diseases of spinal cord: Secondary | ICD-10-CM | POA: Diagnosis not present

## 2023-01-31 LAB — BASIC METABOLIC PANEL
Anion gap: 9 (ref 5–15)
BUN: 25 mg/dL — ABNORMAL HIGH (ref 6–20)
CO2: 24 mmol/L (ref 22–32)
Calcium: 10.4 mg/dL — ABNORMAL HIGH (ref 8.9–10.3)
Chloride: 98 mmol/L (ref 98–111)
Creatinine, Ser: 1.01 mg/dL — ABNORMAL HIGH (ref 0.44–1.00)
GFR, Estimated: >60 mL/min (ref >60)
Glucose, Bld: 96 mg/dL (ref 70–99)
Potassium: 4.4 mmol/L (ref 3.5–5.1)
Sodium: 131 mmol/L — ABNORMAL LOW (ref 135–145)

## 2023-01-31 LAB — HEMOGLOBIN: Hemoglobin: 8.4 g/dL — ABNORMAL LOW (ref 12.0–15.0)

## 2023-01-31 LAB — ECHOCARDIOGRAM COMPLETE
AR max vel: 3.09 cm2
AV Area VTI: 3.04 cm2
AV Area mean vel: 3.04 cm2
AV Mean grad: 9 mmHg
AV Peak grad: 18.4 mmHg
Ao pk vel: 2.15 m/s
Area-P 1/2: 3.72 cm2
Height: 62 in
MV VTI: 2.9 cm2
S' Lateral: 3.6 cm
Weight: 2720 oz

## 2023-01-31 LAB — BRAIN NATRIURETIC PEPTIDE: B Natriuretic Peptide: 43 pg/mL (ref 0.0–100.0)

## 2023-01-31 MED ORDER — GABAPENTIN 300 MG PO CAPS
400.0000 mg | ORAL_CAPSULE | Freq: Three times a day (TID) | ORAL | Status: DC
  Administered 2023-01-31 – 2023-02-03 (×10): 400 mg via ORAL
  Filled 2023-01-31 (×10): qty 1

## 2023-01-31 MED ORDER — POLYETHYLENE GLYCOL 3350 17 G PO PACK
17.0000 g | PACK | Freq: Two times a day (BID) | ORAL | Status: DC
  Administered 2023-01-31 – 2023-02-06 (×11): 17 g via ORAL
  Filled 2023-01-31 (×12): qty 1

## 2023-01-31 MED ORDER — FUROSEMIDE 10 MG/ML IJ SOLN
40.0000 mg | Freq: Once | INTRAMUSCULAR | Status: AC
  Administered 2023-01-31: 40 mg via INTRAVENOUS
  Filled 2023-01-31: qty 4

## 2023-01-31 MED ORDER — AZITHROMYCIN 500 MG PO TABS
250.0000 mg | ORAL_TABLET | Freq: Every day | ORAL | Status: AC
  Administered 2023-02-01 – 2023-02-04 (×4): 250 mg via ORAL
  Filled 2023-01-31 (×4): qty 1

## 2023-01-31 MED ORDER — SODIUM CHLORIDE 0.9 % IV SOLN
2.0000 g | INTRAVENOUS | Status: DC
  Administered 2023-01-31 – 2023-02-04 (×5): 2 g via INTRAVENOUS
  Filled 2023-01-31 (×5): qty 20

## 2023-01-31 MED ORDER — OXYCODONE HCL ER 10 MG PO T12A
10.0000 mg | EXTENDED_RELEASE_TABLET | Freq: Two times a day (BID) | ORAL | Status: DC
  Administered 2023-01-31 – 2023-02-03 (×8): 10 mg via ORAL
  Filled 2023-01-31 (×8): qty 1

## 2023-01-31 MED ORDER — AZITHROMYCIN 500 MG PO TABS
500.0000 mg | ORAL_TABLET | Freq: Every day | ORAL | Status: AC
  Administered 2023-01-31: 500 mg via ORAL
  Filled 2023-01-31: qty 1

## 2023-01-31 MED ORDER — DIAZEPAM 2 MG PO TABS: 2.0000 mg | ORAL_TABLET | Freq: Three times a day (TID) | ORAL | Status: DC | PRN

## 2023-01-31 MED ORDER — MORPHINE SULFATE (PF) 2 MG/ML IV SOLN
2.0000 mg | INTRAVENOUS | Status: AC | PRN
  Administered 2023-01-31: 2 mg via INTRAVENOUS
  Filled 2023-01-31: qty 1

## 2023-01-31 MED ORDER — POTASSIUM CHLORIDE CRYS ER 20 MEQ PO TBCR
20.0000 meq | EXTENDED_RELEASE_TABLET | Freq: Two times a day (BID) | ORAL | Status: DC
  Administered 2023-01-31 – 2023-02-03 (×6): 20 meq via ORAL
  Filled 2023-01-31 (×6): qty 1

## 2023-01-31 MED ORDER — FUROSEMIDE 10 MG/ML IJ SOLN
40.0000 mg | Freq: Two times a day (BID) | INTRAMUSCULAR | Status: DC
  Administered 2023-01-31 – 2023-02-02 (×4): 40 mg via INTRAVENOUS
  Filled 2023-01-31 (×4): qty 4

## 2023-01-31 MED ORDER — CELECOXIB 200 MG PO CAPS
200.0000 mg | ORAL_CAPSULE | Freq: Two times a day (BID) | ORAL | Status: DC
  Administered 2023-01-31 – 2023-02-06 (×13): 200 mg via ORAL
  Filled 2023-01-31 (×13): qty 1

## 2023-01-31 MED ORDER — LACTULOSE 10 GM/15ML PO SOLN
10.0000 g | Freq: Two times a day (BID) | ORAL | Status: DC | PRN
  Administered 2023-02-02 (×2): 20 g via ORAL
  Filled 2023-01-31 (×2): qty 30

## 2023-01-31 NOTE — Progress Notes (Signed)
Occupational Therapy Treatment Patient Details Name: Paula Garrett MRN: [id number] DOB: Aug 26, 1965 Today's Date: 01/31/2023   History of present illness Pt is a 57 year old female presenting to the ED with chest pain and SOB. Imaging study showed large soft tissue mass in left paraspinal soft tissue at T6-T7 with destruction of seventh rib and invasion of T6 and T7 vertebral bodies involving left posterior elements as well as epidural tumor and spinal canal with moderate spinal canal stenosis without frank cord compression or cord edema, acute/subacute compression fracture of T11 vertebral body with up to 30% vertebral body height and minimal bony retropulsion, possibly pathologic. Right occipital calvarium with small enhancing lesion, suspicious for additional metastatic or myeloma lesion.Pt is now s/p Posterior Segmental Instrumentation T4 to T9, Posterolateral arthrodesis from T4 to T9 ,Thoracic Laminotomy at T6 and T7 01/28/23.        PMH significant for HTN, depression, anxiety, history of tobacco use, GERD, known multiple myeloma followed as outpatient   OT comments  Chart reviewed to date, noted pt with a new oxygen requirement on 6 L via HFNC. Pt spo2 >95% throughout, monitored via ear lobe as pt has red sns nails, ?poor pleth with dynamap finger sensor. Pt report s8/10 pain, recently pre medicated, asking to wash up and change clothes. Pt performs ADL at a supervision level with intermittent vcs for precautions/body mechanics. Pt is making progress towards goals, will continue to benefit from skilled OT to address functional deficits. OT will follow acutely.       If plan is discharge home, recommend the following:  A little help with walking and/or transfers;A little help with bathing/dressing/bathroom;Assist for transportation;Help with stairs or ramp for entrance;Assistance with cooking/housework   Equipment Recommendations  Tub/shower bench    Recommendations for Other Services       Precautions / Restrictions Precautions Precautions: Fall Precaution Comments: wound vac, new o2 requirement Restrictions Weight Bearing Restrictions: No       Mobility Bed Mobility Overal bed mobility: Needs Assistance Bed Mobility: Supine to Sit, Sit to Supine     Supine to sit: Supervision, HOB elevated Sit to supine: Supervision, HOB elevated   General bed mobility comments: intermittent vcs for body mechanics    Transfers Overall transfer level: Needs assistance Equipment used: Rolling walker (2 wheels) Transfers: Sit to/from Stand Sit to Stand: Supervision                 Balance Overall balance assessment: Needs assistance Sitting-balance support: Feet supported Sitting balance-Leahy Scale: Good     Standing balance support: Bilateral upper extremity supported Standing balance-Leahy Scale: Good                             ADL either performed or assessed with clinical judgement   ADL Overall ADL's : Needs assistance/impaired     Grooming: Wash/dry face;Sitting;Set up   Upper Body Bathing: Supervision/ safety;Sitting   Lower Body Bathing: Sit to/from stand;Supervison/ safety   Upper Body Dressing : Supervision/safety;Sitting Upper Body Dressing Details (indicate cue type and reason): new gown Lower Body Dressing: Supervision/safety Lower Body Dressing Details (indicate cue type and reason): underwear, socks using figure 4 technique with intermittent vcs Toilet Transfer: Radiographer, therapeutic Details (indicate cue type and reason): simulated                  Cognition Arousal: Alert Behavior During Therapy: WFL for tasks assessed/performed Overall Cognitive Status: Within  Functional Limits for tasks assessed                                                General Comments spo2 >95% via ear lobe monitor with good pleth on 6 L via [place]; good demo of flutter valve    Pertinent Vitals/ Pain        Pain Assessment Pain Assessment: 0-10 Pain Score: 8  Pain Location: back Pain Descriptors / Indicators: Discomfort Pain Intervention(s): Premedicated before session, Limited activity within patient's tolerance, Monitored during session, Repositioned   Frequency  Min 1X/week        Progress Toward Goals  OT Goals(current goals can now be found in the care plan section)  Progress towards OT goals: Progressing toward goals      AM-PAC OT "6 Clicks" Daily Activity     Outcome Measure   Help from another person eating meals?: None Help from another person taking care of personal grooming?: None Help from another person toileting, which includes using toliet, bedpan, or urinal?: None Help from another person bathing (including washing, rinsing, drying)?: A Little Help from another person to put on and taking off regular upper body clothing?: None Help from another person to put on and taking off regular lower body clothing?: A Little 6 Click Score: 22    End of Session Equipment Utilized During Treatment: Rolling walker (2 wheels);Oxygen  OT Visit Diagnosis: Other abnormalities of gait and mobility (R26.89)   Activity Tolerance Patient tolerated treatment well   Patient Left in bed;with call bell/phone within reach;with bed alarm set   Nurse Communication Mobility status;Other (comment) (vitals)        Time: 1610-9604 OT Time Calculation (min): 20 min  Charges: OT General Charges $OT Visit: 1 Visit OT Treatments $Self Care/Home Management : 8-22 mins Oleta Mouse, OTD OTR/L  01/31/23, 12:11 PM

## 2023-01-31 NOTE — Progress Notes (Signed)
Progress Note   Patient: Paula Garrett QIO:[id number] DOB: [birth year] DOA: 01/24/2023     7 DOS: the patient was seen and examined on 01/31/2023   Brief hospital course: [name] is a 57 year old female with history of hypertension, depression, anxiety, history of tobacco use, GERD, who presents to the emergency department for chief concerns of chest pain and shortness of breath for 1 month.  Patient also has known multiple myeloma which has been followed as outpatient without requiring any treatment. Imaging study showed large soft tissue mass in left paraspinal soft tissue at T6-T7 with destruction of seventh rib and invasion of T6 and T7 vertebral bodies involving left posterior elements as well as epidural tumor and spinal canal with moderate spinal canal stenosis without frank cord compression or cord edema, acute/subacute compression fracture of T11 vertebral body with up to 30% vertebral body height and minimal bony retropulsion, possibly pathologic. Right occipital calvarium with small enhancing lesion, suspicious for additional metastatic or myeloma lesion. Patient has been seen by neurosurgery, started on IV steroids.   Surgery 8/13: 1. Posterior Segmental Instrumentation T4 to T9; 2. Posterolateral arthrodesis from T4 to T9; 3. Thoracic Laminotomy at T6 and T7.  8/14.  Patient walked to the bathroom and her drain was disconnected.  Blood was dripping out of the drain.  I contacted neurosurgery and okay to clean with alcohol swab and hooked back the drain. 8/15.  Patient having a lot more pain today.  Having some cough.  Chest x-ray concerning for fluid overload.  IV fluids discontinued this morning.  Give a dose of Lasix.  Assessment and Plan: * Acute hypoxic respiratory failure (HCC) Patient placed on high flow nasal cannula.  Last chest x-ray more concerning for fluid overload.  Patient given Lasix 40 mg IV this morning will give another 40 this afternoon.  Will start antibiotics  just in case pneumonia.  Fluid overload Chest x-ray concerning for fluid overload.  IV Lasix given this morning and will give again this afternoon.  Echocardiogram ordered.  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  Follow-up outpatient oncology.  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 Katrinka Blazing.  Patient had a posterior segmental instrumentation T4-T9, posterior lateral arthrodesis from T4-T9, thoracic laminectomy at T6 and T7.  Patient with more pain last 2 days.  Palliative care added OxyContin and adjusted pain medications.  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  Drug induced constipation Lactulose made as needed.  Hypercalcemia Last calcium 10.4  Acute blood loss anemia 8.4 today  AKI (acute kidney injury) (HCC) Watch creatinine with IV Lasix.  (Creatinine went up to 1.05 from a baseline creatinine of 0.65.) last creatinine 1.01.  Hyponatremia Patient on salt tablets.  Sodium slowly rising to 131.  Benign essential hypertension Hold Norvasc and Avapro.  IV Lasix.  Barrett's esophagus without dysplasia Continue Protonix  Anxiety Continue Lexapro and clonazepam  Obesity (BMI 30-39.9) BMI 31.09  Asthma nebulizer standing dose.  Hypothyroidism Continue levothyroxine.  Insomnia As needed Ambien        Subjective: Patient this morning on high flow nasal cannula.  Patient this morning complained of back pain more than trouble breathing.  Physical Exam: Vitals:   01/31/23 0551 01/31/23 0756 01/31/23 0809 01/31/23 1123  BP: (!) 143/61 (!) 106/57    Pulse: 100 87    Resp: 20 16    Temp: 97.8 F (36.6 C) 98.2 F (36.8 C)    TempSrc: Oral  Oral    SpO2: (!) 86% (!) 83% 91% 90%  Weight:      Height:       Physical Exam HENT:     Head: Normocephalic.     Mouth/Throat:     Pharynx: No oropharyngeal exudate.  Eyes:     General: Lids are normal.     Conjunctiva/sclera: Conjunctivae normal.   Cardiovascular:     Rate and Rhythm: Normal rate and regular rhythm.     Heart sounds: Normal heart sounds, S1 normal and S2 normal.  Pulmonary:     Breath sounds: Examination of the right-middle field reveals decreased breath sounds. Examination of the left-middle field reveals decreased breath sounds. Examination of the right-lower field reveals decreased breath sounds and rhonchi. Examination of the left-lower field reveals decreased breath sounds and rhonchi. Decreased breath sounds and rhonchi present. No wheezing or rales.  Abdominal:     Palpations: Abdomen is soft.     Tenderness: There is no abdominal tenderness.  Musculoskeletal:     Right lower leg: No swelling.     Left lower leg: No swelling.  Skin:    General: Skin is warm.     Findings: No rash.  Neurological:     Mental Status: She is alert and oriented to person, place, and time.     Data Reviewed: Hemoglobin 8.4, calcium 10.4, sodium 131, creatinine 1.01  Disposition: Status is: Inpatient Remains inpatient appropriate because: With acute respiratory failure today and hypoxia.  Will diurese today.  Added antibiotics just in case pneumonia.  Planned Discharge Destination: Home with Home Health    Time spent: 28 minutes  Author: Alford Highland, MD 01/31/2023 3:22 PM  For on call review [email or web address].

## 2023-01-31 NOTE — Plan of Care (Signed)

## 2023-01-31 NOTE — Progress Notes (Signed)
Physical Therapy Treatment Patient Details Name: Paula Garrett MRN: [id number] DOB: 1965-09-25 Today's Date: 01/31/2023   History of Present Illness Pt is a 57 year old female presenting to the ED with chest pain and SOB. Imaging study showed large soft tissue mass in left paraspinal soft tissue at T6-T7 with destruction of seventh rib and invasion of T6 and T7 vertebral bodies involving left posterior elements as well as epidural tumor and spinal canal with moderate spinal canal stenosis without frank cord compression or cord edema, acute/subacute compression fracture of T11 vertebral body with up to 30% vertebral body height and minimal bony retropulsion, possibly pathologic. Right occipital calvarium with small enhancing lesion, suspicious for additional metastatic or myeloma lesion.Pt is now s/p Posterior Segmental Instrumentation T4 to T9, Posterolateral arthrodesis from T4 to T9 ,Thoracic Laminotomy at T6 and T7 01/28/23.        PMH significant for HTN, depression, anxiety, history of tobacco use, GERD, known multiple myeloma followed as outpatient    PT Comments  Patient complains of continued pain despite medication but wants to get out of bed and walk. Patient walked a lap in the hallway with rolling walker, slow and cautious with no loss of balance. Patient now on 6 L02, unable to get accurate pleth with Sp02 during activity. No shortness of breath is noted with activity. Recommend to continue PT to maximize independence and facilitate return to prior level of function.     If plan is discharge home, recommend the following: A little help with walking and/or transfers;A little help with bathing/dressing/bathroom;Help with stairs or ramp for entrance;Direct supervision/assist for medications management;Assistance with cooking/housework;Assist for transportation   Can travel by private vehicle        Equipment Recommendations  Rolling walker (2 wheels)    Recommendations for Other Services        Precautions / Restrictions Precautions Precautions: Fall Precaution Comments: wound vac, new 02 Restrictions Weight Bearing Restrictions: No     Mobility  Bed Mobility Overal bed mobility: Needs Assistance Bed Mobility: Supine to Sit, Sit to Supine     Supine to sit: Supervision, HOB elevated Sit to supine: Supervision, HOB elevated   General bed mobility comments: encourage logroll technique    Transfers Overall transfer level: Needs assistance Equipment used: Rolling walker (2 wheels) Transfers: Sit to/from Stand Sit to Stand: Supervision           General transfer comment: supervision for safety    Ambulation/Gait Ambulation/Gait assistance: Contact guard assist Gait Distance (Feet): 200 Feet Assistive device: Rolling walker (2 wheels) Gait Pattern/deviations: Step-through pattern, Drifts right/left Gait velocity: decreased     General Gait Details: patient ambulated in hallway with rolling walker. no loss of balance with ambulation, however patient is slow and cautious. activity tolerance is limited by pain. patient is now on 6 L02 (unable to get accurate pleth reading while ambulating) however no significant shortness of breath is noted with activity   Stairs             Wheelchair Mobility     Tilt Bed    Modified Rankin (Stroke Patients Only)       Balance Overall balance assessment: Needs assistance Sitting-balance support: Feet supported Sitting balance-Leahy Scale: Good     Standing balance support: Bilateral upper extremity supported Standing balance-Leahy Scale: Good  Cognition Arousal: Alert Behavior During Therapy: WFL for tasks assessed/performed Overall Cognitive Status: Within Functional Limits for tasks assessed                                          Exercises      General Comments General comments (skin integrity, edema, etc.): spo2 >95% via ear  lobe monitor with good pleth on 6 L via [place]; good demo of flutter valve      Pertinent Vitals/Pain Pain Assessment Pain Assessment: 0-10 Pain Score: 8  Pain Location: back Pain Descriptors / Indicators: Discomfort Pain Intervention(s): Limited activity within patient's tolerance, Repositioned, Premedicated before session, Monitored during session    Home Living                          Prior Function            PT Goals (current goals can now be found in the care plan section) Acute Rehab PT Goals Patient Stated Goal: to go home PT Goal Formulation: With patient Time For Goal Achievement: 02/12/23 Potential to Achieve Goals: Good Progress towards PT goals: Progressing toward goals    Frequency    Min 1X/week      PT Plan      Co-evaluation              AM-PAC PT "6 Clicks" Mobility   Outcome Measure  Help needed turning from your back to your side while in a flat bed without using bedrails?: A Little Help needed moving from lying on your back to sitting on the side of a flat bed without using bedrails?: A Little Help needed moving to and from a bed to a chair (including a wheelchair)?: A Little Help needed standing up from a chair using your arms (e.g., wheelchair or bedside chair)?: A Little Help needed to walk in hospital room?: A Little Help needed climbing 3-5 steps with a railing? : A Little 6 Click Score: 18    End of Session Equipment Utilized During Treatment: Oxygen Activity Tolerance: Patient tolerated treatment well;Patient limited by pain Patient left: in bed;with call bell/phone within reach Nurse Communication: Mobility status PT Visit Diagnosis: Other abnormalities of gait and mobility (R26.89);Difficulty in walking, not elsewhere classified (R26.2)     Time: 1100-1117 PT Time Calculation (min) (ACUTE ONLY): 17 min  Charges:    $Therapeutic Activity: 8-22 mins PT General Charges $$ ACUTE PT VISIT: 1 Visit                      Donna Bernard, PT, MPT    Ina Homes 01/31/2023, 1:23 PM

## 2023-01-31 NOTE — Progress Notes (Signed)
Neurosurgery Progress Note  History: Paula Garrett is a 57 y.o s/p T4-9 PSF, T6-7 lamiotomy for tumor  POD3: Increased back pain this morning in addition to some SOB. Pt currently getting breathing treatment  POD2: Pt reporting more back pain this morning POD1: Some trouble with the drain coming disconnected overnight. Pt feels her back is well controlled and denies any concerns  Physical Exam: Vitals:   01/31/23 0756 01/31/23 0809  BP: (!) 106/57   Pulse: 87   Resp: 16   Temp: 98.2 F (36.8 C)   SpO2: (!) 83% 91%    AA Ox3 CNI  Strength:5/5 throughout BLE HV 45 Incision covered with incisional wound VAC.  Data:  Other tests/results: Pathology pending   Assessment/Plan:  Paula Garrett is a 57 year old with a history of multiple myeloma found to have lytic lesions at T6 and T7 status post T4-9 posterior spinal fusion and T6-7 laminotomies.  - mobilize - pain control; will make some medication changes given patients increased pain level - ok for DVT prophylaxis - PTOT - HV removed. Will remove wound vac prior to d/c  - remainder of care per primary team. - Please call with any questions or concerns.   Manning Charity PA-C Department of Neurosurgery

## 2023-01-31 NOTE — Progress Notes (Signed)
Palliative Medicine [facility] at [facility] [phone] [phone]   Name: Paula Garrett Date: 01/31/2023 MRN: [id number]  DOB: 1966-02-17  Patient Care Team: Smitty Cords, DO as PCP - General (Family Medicine) Lemar Livings, Merrily Pew, MD (General Surgery) Shelia Media, MD (Internal Medicine) Creig Hines, MD as Consulting Physician (Oncology)    REASON FOR CONSULTATION: Paula Garrett is a 57 y.o. female with multiple medical problems including hypertension, depression, anxiety, history of tobacco abuse, and multiple myeloma, who was admitted to hospital 01/24/2023 with chest pain and shortness of breath and was found to have a large soft tissue mass in the left T6-T7 paraspinal area with destruction of seventh rib and invasion of T6 and T7 vertebral bodies.  Patient has had ongoing pain and constipation.  She was referred to palliative care to address goals and manage ongoing symptoms.    CODE STATUS: Full code  PAST MEDICAL HISTORY: Past Medical History:  Diagnosis Date   Anxiety    Arthritis    Asthma    Barrett's esophagus 2015   COVID-19    03/10/20   Depression    GERD (gastroesophageal reflux disease)    Hypertension    Hypothyroidism    Pneumonia    Smoldering myeloma    Thyroid disease     PAST SURGICAL HISTORY:  Past Surgical History:  Procedure Laterality Date   ABLATION  2006   APPLICATION OF INTRAOPERATIVE CT SCAN N/A 01/28/2023   Procedure: APPLICATION OF INTRAOPERATIVE CT SCAN;  Surgeon: Loreen Freud, MD;  Location: [facility] ORS;  Service: Neurosurgery;  Laterality: N/A;   BREAST CYST ASPIRATION Left 1998   BREAST SURGERY     cyst aspiration   BREAST SURGERY  1998   cyst aspiration   COLONOSCOPY  03/2014   COLONOSCOPY WITH PROPOFOL N/A 01/06/2020   Procedure: COLONOSCOPY WITH PROPOFOL;  Surgeon: Toledo, Boykin Nearing, MD;  Location: [facility] ENDOSCOPY;  Service: Gastroenterology;  Laterality:  N/A;   ENDOMETRIAL ABLATION     ESOPHAGOGASTRODUODENOSCOPY (EGD) WITH PROPOFOL N/A 08/15/2015   Procedure: ESOPHAGOGASTRODUODENOSCOPY (EGD) WITH PROPOFOL;  Surgeon: Christena Deem, MD;  Location: [facility] ENDOSCOPY;  Service: Endoscopy;  Laterality: N/A;   ESOPHAGOGASTRODUODENOSCOPY (EGD) WITH PROPOFOL N/A 01/22/2016   Procedure: ESOPHAGOGASTRODUODENOSCOPY (EGD) WITH PROPOFOL;  Surgeon: Christena Deem, MD;  Location: [facility] ENDOSCOPY;  Service: Endoscopy;  Laterality: N/A;   ESOPHAGOGASTRODUODENOSCOPY (EGD) WITH PROPOFOL N/A 01/06/2020   Procedure: ESOPHAGOGASTRODUODENOSCOPY (EGD) WITH PROPOFOL;  Surgeon: Toledo, Boykin Nearing, MD;  Location: [facility] ENDOSCOPY;  Service: Gastroenterology;  Laterality: N/A;   GASTRIC BYPASS  2005   HERNIA REPAIR     NASAL SINUS SURGERY     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   TOTAL THYROIDECTOMY     TUBAL LIGATION      HEMATOLOGY/ONCOLOGY HISTORY:  Oncology History   No history exists.    ALLERGIES:  is allergic to hctz [hydrochlorothiazide].  MEDICATIONS:  Current Facility-Administered Medications  Medication Dose Route Frequency Provider Last Rate Last Admin   0.9 %  sodium chloride infusion  250 mL Intravenous Continuous Susanne Nelta Caudill, PA 1 mL/hr at 01/29/23 0018 Infusion Verify at 01/29/23 0018   acetaminophen (TYLENOL) tablet 650 mg  650 mg Oral Q4H PRN Susanne Kourosh Jablonsky, PA       Or   acetaminophen (TYLENOL) suppository 650 mg  650 mg Rectal Q4H PRN Susanne Denard Tuminello, PA       acetaminophen (TYLENOL)  tablet 1,000 mg  1,000 mg Oral Q6H Susanne Brennan Litzinger, [place]   1,000 mg at 01/31/23 0914   albuterol (PROVENTIL) (2.5 MG/3ML) 0.083% nebulizer solution 2.5 mg  2.5 mg Nebulization QID Alford Highland, MD   2.5 mg at 01/31/23 1120   alum & mag hydroxide-simeth (MAALOX/MYLANTA) 200-200-20 MG/5ML suspension 30 mL  30 mL Oral Q4H PRN Susanne Miski Feldpausch, PA   30 mL at 01/29/23 0034   bisacodyl (DULCOLAX) suppository 10 mg  10 mg Rectal Daily PRN Susanne Feather Berrie, PA       celecoxib (CELEBREX) capsule 200 mg  200 mg Oral BID Susanne Jermall Isaacson, PA   200 mg at 01/31/23 1129   clonazePAM (KLONOPIN) tablet 1 mg  1 mg Oral BID Susanne Tenessa Marsee, PA   1 mg at 01/31/23 0915   cyclobenzaprine (FLEXERIL) tablet 10 mg  10 mg Oral TID Susanne Shawnice Tilmon, PA   10 mg at 01/31/23 0915   docusate sodium (COLACE) capsule 100 mg  100 mg Oral BID Susanne Cheyne Bungert, PA   100 mg at 01/31/23 0914   enoxaparin (LOVENOX) injection 40 mg  40 mg Subcutaneous Q24H Susanne Morgen Linebaugh, [place]   40 mg at 01/31/23 3244   escitalopram (LEXAPRO) tablet 10 mg  10 mg Oral Daily Susanne [place], [place]   10 mg at 01/31/23 0102   gabapentin (NEURONTIN) capsule 400 mg  400 mg Oral TID Susanne Leaman Abe, PA       lactulose (CHRONULAC) 10 GM/15ML solution 10-20 g  10-20 g Oral BID PRN Rayfield Beem, Daryl Eastern, NP       levothyroxine (SYNTHROID) tablet 250 mcg  250 mcg Oral QAC breakfast Susanne Quante Pettry, [place]   250 mcg at 01/31/23 7253   magnesium citrate solution 1 Bottle  1 Bottle Oral Once PRN Susanne Jhoana Upham, PA       menthol-cetylpyridinium (CEPACOL) lozenge 3 mg  1 lozenge Oral PRN Susanne Idonia Zollinger, PA       Or   phenol (CHLORASEPTIC) mouth spray 1 spray  1 spray Mouth/Throat PRN Susanne Jerrald Doverspike, PA       mometasone-formoterol [facility]) 200-5 MCG/ACT inhaler 2 puff  2 puff Inhalation BID Susanne Dionna Wiedemann, PA   2 puff at 01/31/23 6644   morphine (PF) 2 MG/ML injection 2 mg  2 mg Intravenous Q2H PRN Susanne Brennan Karam, PA   2 mg at 01/31/23 1129   oxyCODONE (Oxy IR/ROXICODONE) immediate release tablet 10 mg  10 mg Oral Q3H PRN Susanne Lucianne Smestad, PA   10 mg at 01/31/23 0915   oxyCODONE (Oxy IR/ROXICODONE) immediate release tablet 5 mg  5 mg Oral Q3H PRN Susanne Nikeisha Klutz, PA   5 mg at 01/28/23 1814   oxyCODONE (OXYCONTIN) 12 hr tablet 10 mg  10 mg Oral Q12H Safiyah Cisney, Daryl Eastern, NP       pantoprazole (PROTONIX) EC tablet 40 mg  40 mg Oral BID AC Susanne Garreth Burnsworth, PA   40 mg at  01/31/23 0914   polyethylene glycol (MIRALAX / GLYCOLAX) packet 17 g  17 g Oral BID Edker Punt, Daryl Eastern, NP       senna (SENOKOT) tablet 8.6 mg  1 tablet Oral BID Susanne Kili Gracy, PA   8.6 mg at 01/31/23 0913   sodium chloride flush (NS) 0.9 % injection 3 mL  3 mL Intravenous Q12H Susanne Spiros Greenfeld, PA   3 mL at 01/31/23 0917   sodium chloride flush (NS) 0.9 %  injection 3 mL  3 mL Intravenous PRN Susanne Cheryal Salas, PA       sodium chloride tablet 1 g  1 g Oral BID WC Susanne Kishawn Pickar, PA   1 g at 01/31/23 0914   umeclidinium bromide (INCRUSE ELLIPTA) 62.5 MCG/ACT 1 puff  1 puff Inhalation Daily Susanne Dede Dobesh, [place]   1 puff at 01/31/23 3664   Vitamin D (Ergocalciferol) (DRISDOL) 1.25 MG (50000 UNIT) capsule 50,000 Units  50,000 Units Oral Q7 days Susanne Nyasiah Moffet, PA   50,000 Units at 01/25/23 1051   zolpidem (AMBIEN) tablet 5 mg  5 mg Oral QHS PRN Susanne Lyla Jasek, PA   5 mg at 01/30/23 2126    VITAL SIGNS: BP (!) 106/57 (BP Location: Right Arm)   Pulse 87   Temp 98.2 F (36.8 C) (Oral)   Resp 16   Ht 5\' 2"  (1.575 m)   Wt 170 lb (77.1 kg)   SpO2 90%   BMI 31.09 kg/m  Filed Weights   01/24/23 0845  Weight: 170 lb (77.1 kg)    Estimated body mass index is 31.09 kg/m as calculated from the following:   Height as of this encounter: 5\' 2"  (1.575 m).   Weight as of this encounter: 170 lb (77.1 kg).  LABS: CBC:    Component Value Date/Time   WBC 9.4 01/30/2023 0324   HGB 8.4 (L) 01/31/2023 0356   HGB 13.0 02/14/2012 1528   HCT 28.4 (L) 01/30/2023 0324   HCT 38.5 07/02/2011 1529   PLT 226 01/30/2023 0324   PLT 311 07/02/2011 1529   MCV 96.6 01/30/2023 0324   MCV 90 07/02/2011 1529   NEUTROABS 4.1 09/10/2022 1329   LYMPHSABS 2.9 09/10/2022 1329   MONOABS 0.6 09/10/2022 1329   EOSABS 0.1 09/10/2022 1329   BASOSABS 0.0 09/10/2022 1329   Comprehensive Metabolic Panel:    Component Value Date/Time   NA 131 (L) 01/31/2023 0356   K 4.4 01/31/2023 0356   CL 98  01/31/2023 0356   CO2 24 01/31/2023 0356   BUN 25 (H) 01/31/2023 0356   CREATININE 1.01 (H) 01/31/2023 0356   GLUCOSE 96 01/31/2023 0356   CALCIUM 10.4 (H) 01/31/2023 0356   AST 20 09/10/2022 1329   ALT 17 09/10/2022 1329   ALKPHOS 48 09/10/2022 1329   BILITOT 0.4 09/10/2022 1329   PROT 8.6 (H) 09/10/2022 1329   ALBUMIN 3.0 (L) 09/10/2022 1329    RADIOGRAPHIC STUDIES: DG Chest Port 1 View  Result Date: 01/30/2023 CLINICAL DATA:  Cough, shortness of breath EXAM: PORTABLE CHEST 1 VIEW COMPARISON:  01/24/2023 FINDINGS: Cardiomegaly. Diffuse bilateral interstitial pulmonary opacity and trace pleural effusions. Interval thoracic fusion. IMPRESSION: Cardiomegaly with diffuse bilateral interstitial pulmonary opacity and trace pleural effusions, most consistent with edema. Electronically Signed   By: Jearld Lesch M.D.   On: 01/30/2023 14:07   DG Thoracic Spine 2 View  Result Date: 01/28/2023 CLINICAL DATA:  Elective surgery. T4 through T9 fusion of biopsy for lesion with intra 3D imaging. EXAM: THORACIC SPINE 2 VIEWS COMPARISON:  Preoperative imaging. FINDINGS: Intraoperative 3D imaging of the thoracic spine obtained. Posterior rod with pedicle screws at multiple levels. Known paravertebral mass better assessed on preoperative imaging. No fluoroscopic spot views provided. Fluoroscopy time 1 minutes 30 seconds. Dose is 97.045 mGy IMPRESSION: Intraoperative 3D imaging of the thoracic spine. Electronically Signed   By: Narda Rutherford M.D.   On: 01/28/2023 16:28   DG C-Arm 1-60 Min-No Report  Result Date:  01/28/2023 Fluoroscopy was utilized by the requesting physician.  No radiographic interpretation.   DG C-Arm 1-60 Min-No Report  Result Date: 01/28/2023 Fluoroscopy was utilized by the requesting physician.  No radiographic interpretation.   DG C-Arm 1-60 Min-No Report  Result Date: 01/28/2023 Fluoroscopy was utilized by the requesting physician.  No radiographic interpretation.   DG C-Arm  1-60 Min-No Report  Result Date: 01/28/2023 Fluoroscopy was utilized by the requesting physician.  No radiographic interpretation.   MR THORACIC SPINE W WO CONTRAST  Result Date: 01/24/2023 CLINICAL DATA:  Soft tissue mass with bony destruction involving the left seventh rib and T6 and T7 vertebral bodies. EXAM: MRI CERVICAL AND THORACIC SPINE WITHOUT AND WITH CONTRAST TECHNIQUE: Multiplanar and multiecho pulse sequences of the cervical spine, to include the craniocervical junction and cervicothoracic junction, and the thoracic spine, were obtained without and with intravenous contrast. CONTRAST:  7mL GADAVIST GADOBUTROL 1 MMOL/ML IV SOLN COMPARISON:  Same day CTA chest, cervical and thoracic spine MRI 07/20/2020 FINDINGS: MRI CERVICAL SPINE FINDINGS Alignment: There is trace retrolisthesis of C3 on C4 and trace anterolisthesis of C5 on C6. Vertebrae: Vertebral body heights are preserved. Background marrow signal is normal. A T1 hypointense/T2 hyperintense lesion in the clivus is unchanged since 2022, favored benign. There is no suspicious marrow signal abnormality or marrow edema in the cervical spine. There is no abnormal marrow enhancement in the cervical spine. There is a 6 mm enhancing lesion in the right occipital calvarium which was not seen on the prior cervical spine MRI from 2022; however, this area may not has been included within the field of view. Cord: Normal in signal and morphology. Posterior Fossa, vertebral arteries, paraspinal tissues: The imaged posterior fossa is unremarkable. The vertebral artery flow voids are normal. The paraspinal soft tissues are unremarkable. Disc levels: There is overall mild degenerative change in the cervical spine without significant spinal canal or neural foraminal stenosis. MRI THORACIC SPINE FINDINGS Alignment:  Normal. Vertebrae: Background marrow signal is normal. There is infiltrative T1 hypointensity occupying most of the T6 and T7 vertebral bodies  extending into the left pedicles and facets. There is a bulky soft tissue mass in the adjacent left paraspinal/subpleural soft tissues with destruction of the seventh rib. The bulk of the soft tissue component measures up to proximally 7.2 cm x 3.4 cm x 2.7 cm. 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 There is mild loss of vertebral body height at both levels consistent with associated pathologic fractures. There is compression deformity of the T11 vertebral body with up to approximately 30% loss of vertebral body height anteriorly and minimal bony retropulsion. There is faint edema along the superior endplate consistent with acute to subacute chronicity. This fracture may be pathologic. There are probable additional lesions involving multiple additional ribs (for example 25-21, 25-24). The above findings are new since the MRI from 2022. Cord:  There is no cord signal abnormality or abnormal enhancement. Paraspinal and other soft tissues: Unremarkable, aside from the bulky paraspinal tumor described above. Disc levels: There is a small disc protrusion at T10-T11 without significant spinal canal or neural foraminal stenosis. Otherwise, there is overall minimal background degenerative change without significant spinal canal or neural foraminal stenosis. IMPRESSION: 1. Large soft tissue mass in the left  paraspinal soft tissues at T6-T7 with destruction of the seventh rib and invasion of the T6 and T7 vertebral bodies with involvement of the left posterior elements as well as epidural tumor in the spinal canal resulting in moderate spinal canal stenosis without frank cord compression or cord edema. 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 2. Acute to subacute compression fracture of the T11 vertebral body with up to approximately 30% loss of vertebral body height and minimal bony retropulsion, possibly pathologic. 3. Small enhancing lesion in the right occipital calvarium, not definitely present on the prior cervical spine from 2022 (though possibly not included within the field of view on that study), suspicious for an additional metastatic or myeloma lesion. 4. Additional probable lesions involving multiple additional ribs. 5. No acute or suspicious finding in the cervical spine. Electronically Signed   By: Lesia Hausen M.D.   On: 01/24/2023 14:33   MR Cervical Spine W and Wo Contrast  Result Date: 01/24/2023 CLINICAL DATA:  Soft tissue mass with bony destruction involving the left seventh rib and T6 and T7 vertebral bodies. EXAM: MRI CERVICAL AND THORACIC SPINE WITHOUT AND WITH CONTRAST TECHNIQUE: Multiplanar and multiecho pulse sequences of the cervical spine, to include the craniocervical junction and cervicothoracic junction, and the thoracic spine, were obtained without and with intravenous contrast. CONTRAST:  7mL GADAVIST GADOBUTROL 1 MMOL/ML IV SOLN COMPARISON:  Same day CTA chest, cervical and thoracic spine MRI 07/20/2020 FINDINGS: MRI CERVICAL SPINE FINDINGS Alignment: There is trace retrolisthesis of C3 on C4 and trace anterolisthesis of C5 on C6. Vertebrae: Vertebral body heights are preserved. Background marrow signal is normal. A T1 hypointense/T2 hyperintense lesion in the clivus is unchanged since 2022, favored benign. There is no suspicious marrow signal abnormality or marrow edema in the cervical spine. There is no abnormal marrow enhancement in the cervical spine. There is a 6 mm enhancing lesion in the right occipital calvarium which was not seen on the prior cervical spine MRI from 2022; however, this area may not has been included within the field of view. Cord: Normal in signal and morphology. Posterior Fossa,  vertebral arteries, paraspinal tissues: The imaged posterior fossa is unremarkable. The vertebral artery flow voids are normal. The paraspinal soft tissues are unremarkable. Disc levels: There is overall mild degenerative change in the cervical spine without significant spinal canal or neural foraminal stenosis. MRI THORACIC SPINE FINDINGS Alignment:  Normal. Vertebrae: Background marrow signal is normal. There is infiltrative T1 hypointensity occupying most of the T6 and T7 vertebral bodies extending into the left pedicles and facets. There is a bulky soft tissue mass in the adjacent left paraspinal/subpleural soft tissues with destruction of the seventh rib. The bulk of the soft tissue component measures up to proximally 7.2 cm x 3.4 cm x 2.7 cm. 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 There is mild loss of vertebral body height at both levels consistent with associated pathologic fractures. There is compression deformity of the T11 vertebral body with up to approximately 30% loss of vertebral body height anteriorly and minimal bony retropulsion. There is faint edema along the superior endplate consistent with acute to subacute chronicity. This fracture may be pathologic. There are probable additional lesions involving multiple additional ribs (for example  25-21, 25-24). The above findings are new since the MRI from 2022. Cord:  There is no cord signal abnormality or abnormal enhancement. Paraspinal and other soft tissues: Unremarkable, aside from the bulky paraspinal tumor described above. Disc levels: There is a small disc protrusion at T10-T11 without significant spinal canal or neural foraminal stenosis. Otherwise, there is overall minimal background degenerative change  without significant spinal canal or neural foraminal stenosis. IMPRESSION: 1. Large soft tissue mass in the left paraspinal soft tissues at T6-T7 with destruction of the seventh rib and invasion of the T6 and T7 vertebral bodies with involvement of the left posterior elements as well as epidural tumor in the spinal canal resulting in moderate spinal canal stenosis without frank cord compression or cord edema. 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 2. Acute to subacute compression fracture of the T11 vertebral body with up to approximately 30% loss of vertebral body height and minimal bony retropulsion, possibly pathologic. 3. Small enhancing lesion in the right occipital calvarium, not definitely present on the prior cervical spine from 2022 (though possibly not included within the field of view on that study), suspicious for an additional metastatic or myeloma lesion. 4. Additional probable lesions involving multiple additional ribs. 5. No acute or suspicious finding in the cervical spine. Electronically Signed   By: Lesia Hausen M.D.   On: 01/24/2023 14:33   CT Angio Chest PE W and/or Wo Contrast  Result Date: 01/24/2023 CLINICAL DATA:  Chest pain and shortness of breath for a month. EXAM: CT ANGIOGRAPHY CHEST WITH CONTRAST TECHNIQUE: Multidetector CT imaging of the chest was performed using the standard protocol during bolus administration of intravenous contrast. Multiplanar CT image reconstructions and MIPs were obtained to evaluate the vascular anatomy. RADIATION DOSE REDUCTION: This exam was performed according to the departmental dose-optimization program which includes automated exposure control, adjustment of the mA and/or kV according to patient size and/or use of iterative reconstruction technique. CONTRAST:  75mL OMNIPAQUE IOHEXOL 350 MG/ML SOLN COMPARISON:  X-ray 01/24/2023 and older.  Previous CT January 2017 FINDINGS: Cardiovascular: The  thoracic aorta has a normal course and caliber with minimal calcified plaque. There is a bovine type aortic arch, normal variant. Coronary artery calcifications are seen. Please correlate for other coronary risk factors. Heart is nonenlarged. There is slight wall thickening of the left ventricle. Prominent fat along the intra-atrial septum. Pulmonary arteries show some slight enlargement centrally. Please correlate for any evidence of pulmonary artery hypertension. There is heterogeneous enhancement of the pulmonary arterial tree limiting evaluation for emboli. No obvious large and central embolus although several areas are nondiagnostic. Mediastinum/Nodes: Surgical changes identified with a moderate hiatal hernia. Small thyroid gland. No specific abnormal lymph node enlargement identified in the axillary region, hilum or mediastinum. There are some small nodes identified in the posterior mediastinum, posterior to the descending thoracic aorta such as series 4, image 86 but not pathologic by size criteria. Lungs/Pleura: There is some linear opacity lung bases likely scar or atelectasis. No consolidation. Breathing motion identified. No pleural effusion or pneumothorax. 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 Benign hepatic cysts as well. Musculoskeletal: There is large destructive mass involving the posterior aspect of the left seventh rib with extension to involve the central canal of the spine in the associated vertebral bodies of T6 and T7. Cord compression is possible. This mass is estimated in diameter on series 4, image 65 at 8.5 by 4.3 cm. Slight extension  along the extrapleural space in the posterior mediastinum as well. There is question of some subtle other lucent lesions identified along the spine but indeterminate. Critical Value/emergent results were called by telephone at the time of interpretation on 01/24/2023 at 8:41 am to provider [facility] , who verbally acknowledged these results. Review of the MIP images confirms the above findings. IMPRESSION: Aggressive soft tissue mass with bone destruction involving the left seventh rib as well as the T6 and T7 vertebral bodies and central canal. A neoplastic process is possible. There also is a possibility of significant cord compression with canal encroachment along the spine. Recommend further evaluation with spine MRI with and without contrast and a whole-body bone scan. There appears to be some other subtle lucent bone lesions along the spine and sternum. Please correlate for any history of known malignancy including such processes as myeloma or other. Poor opacification of the pulmonary arterial tree limiting evaluation. Several areas are non diagnostic for pulmonary emboli. No obvious large and central embolus. Aortic Atherosclerosis (ICD10-I70.0). Electronically Signed   By: Karen Kays M.D.   On: 01/24/2023 11:47   DG Chest 2 View  Result Date: 01/24/2023 CLINICAL DATA:  Chest pain EXAM: CHEST - 2 VIEW COMPARISON:  Chest x-ray dated January 07, 2023 FINDINGS: The heart size and mediastinal contours are within normal limits. Small hiatal hernia. Both lungs are clear. Pleural-based nodular opacity of the left posterior hemithorax. The visualized skeletal structures are unremarkable. IMPRESSION: Pleural-based nodular opacity of the left posterior hemithorax. Recommend further evaluation with contrast enhanced chest CT. Electronically Signed   By: Allegra Lai M.D.   On: 01/24/2023 09:38   DG Ribs Unilateral Left  Result Date: 01/09/2023 CLINICAL DATA:  Fall, left rib pain. EXAM: LEFT RIBS - 2 VIEW COMPARISON:  Chest radiograph 11/01/2022 FINDINGS: The cardiomediastinal silhouette is stable and within normal limits. Linear opacities in the left midlung likely reflect atelectasis or scar. There is no other focal airspace opacity. There is no pulmonary edema. There is no pleural effusion or  pneumothorax There is no displaced rib fracture or other acute osseous abnormality. IMPRESSION: No displaced rib fracture or other acute osseous abnormality identified. Electronically Signed   By: Lesia Hausen M.D.   On: 01/09/2023 17:13   DG Lumbar Spine Complete  Result Date: 01/09/2023 CLINICAL DATA:  Trauma, fall, pain EXAM: LUMBAR SPINE - COMPLETE 4+ VIEW COMPARISON:  12/24/2018 FINDINGS: No recent fracture is seen. Alignment of posterior margins of vertebral bodies appears normal. Small bony spurs are noted. Facet hypertrophy is seen, more so at L5-S1 level. Arterial calcifications are seen in aorta. IMPRESSION: No recent fracture is seen. Degenerative changes are noted, more so in facet joints in lower lumbar spine. Aortic arteriosclerosis. Electronically Signed   By: Ernie Avena M.D.   On: 01/09/2023 17:12    PERFORMANCE STATUS (ECOG) : 1 - Symptomatic but completely ambulatory  Review of Systems Unless otherwise noted, a complete review of systems is negative.  Physical Exam General: NAD Pulmonary: Unlabored Extremities: no edema, no joint deformities Skin: no rashes Neurological: Weakness but otherwise nonfocal  IMPRESSION: Patient's status post posterior segmental instrumentation T4-T9, posterior lateral arthrodesis from T4-T9, thoracic laminectomy at T6 and T7.  Has a wound VAC in place.  Pathology positive for plasma cells neoplasm.  Patient reports pain has been worse yesterday and today.  She has been receiving frequent dosing of oxycodone about every 3-4 hours.  Additionally, patient has received intermittent IV hydromorphone and acetaminophen.  Total oral MME approximately 80 mg in past 24 hours.  Patient says oxycodone is effective but short-lived.  Will start patient on long-acting opioid.  Patient also reports constipation and says that she has not had a bowel movement since Monday.  Will again liberalize bowel regimen.  PLAN: -Continue current scope of  treatment -Continue oxycodone as needed for breakthrough pain -Start OxyContin 10 mg every 12 hours -Liberalize bowel regimen (add twice daily MiraLAX and as needed lactulose)  Case and plan discussed with Dr. Smith Robert   Time Total: 20 minutes  Visit consisted of counseling and education dealing with the complex and emotionally intense issues of symptom management and palliative care in the setting of serious and potentially life-threatening illness.Greater than 50%  of this time was spent counseling and coordinating care related to the above assessment and plan.  Signed by: Laurette Schimke, PhD, NP-C

## 2023-01-31 NOTE — Assessment & Plan Note (Addendum)
Patient had a pulse ox of 86% on nasal cannula on 8/16.  Patient placed on high flow nasal cannula on 8/16.  Patient still on 1 to 2 L of oxygen.  Would like to try to get off oxygen prior to disposition.

## 2023-02-01 ENCOUNTER — Inpatient Hospital Stay: Payer: Managed Care, Other (non HMO)

## 2023-02-01 DIAGNOSIS — G9589 Other specified diseases of spinal cord: Secondary | ICD-10-CM | POA: Diagnosis not present

## 2023-02-01 DIAGNOSIS — J9601 Acute respiratory failure with hypoxia: Secondary | ICD-10-CM | POA: Diagnosis not present

## 2023-02-01 DIAGNOSIS — C9000 Multiple myeloma not having achieved remission: Secondary | ICD-10-CM | POA: Diagnosis not present

## 2023-02-01 DIAGNOSIS — E8779 Other fluid overload: Secondary | ICD-10-CM | POA: Diagnosis not present

## 2023-02-01 LAB — CBC
HCT: 22.4 % — ABNORMAL LOW (ref 36.0–46.0)
Hemoglobin: 7.7 g/dL — ABNORMAL LOW (ref 12.0–15.0)
MCH: 32.2 pg (ref 26.0–34.0)
MCHC: 34.4 g/dL (ref 30.0–36.0)
MCV: 93.7 fL (ref 80.0–100.0)
Platelets: 236 10*3/uL (ref 150–400)
RBC: 2.39 MIL/uL — ABNORMAL LOW (ref 3.87–5.11)
RDW: 13.3 % (ref 11.5–15.5)
WBC: 7 10*3/uL (ref 4.0–10.5)
nRBC: 0 % (ref 0.0–0.2)

## 2023-02-01 LAB — BASIC METABOLIC PANEL
Anion gap: 8 (ref 5–15)
BUN: 32 mg/dL — ABNORMAL HIGH (ref 6–20)
CO2: 24 mmol/L (ref 22–32)
Calcium: 11 mg/dL — ABNORMAL HIGH (ref 8.9–10.3)
Chloride: 100 mmol/L (ref 98–111)
Creatinine, Ser: 1.04 mg/dL — ABNORMAL HIGH (ref 0.44–1.00)
GFR, Estimated: >60 mL/min (ref >60)
Glucose, Bld: 88 mg/dL (ref 70–99)
Potassium: 4 mmol/L (ref 3.5–5.1)
Sodium: 132 mmol/L — ABNORMAL LOW (ref 135–145)

## 2023-02-01 LAB — MAGNESIUM: Magnesium: 1.6 mg/dL — ABNORMAL LOW (ref 1.7–2.4)

## 2023-02-01 LAB — BRAIN NATRIURETIC PEPTIDE: B Natriuretic Peptide: 71 pg/mL (ref 0.0–100.0)

## 2023-02-01 MED ORDER — MAGNESIUM SULFATE 2 GM/50ML IV SOLN
2.0000 g | Freq: Once | INTRAVENOUS | Status: AC
  Administered 2023-02-01: 2 g via INTRAVENOUS
  Filled 2023-02-01: qty 50

## 2023-02-01 MED ORDER — ALBUTEROL SULFATE (2.5 MG/3ML) 0.083% IN NEBU
2.5000 mg | INHALATION_SOLUTION | Freq: Three times a day (TID) | RESPIRATORY_TRACT | Status: DC
  Administered 2023-02-01 – 2023-02-03 (×5): 2.5 mg via RESPIRATORY_TRACT
  Filled 2023-02-01 (×6): qty 3

## 2023-02-01 NOTE — Progress Notes (Signed)
Progress Note   Patient: Paula Garrett VZD:[id number] DOB: 18-Feb-1966 DOA: 01/24/2023     8 DOS: the patient was seen and examined on 02/01/2023   Brief hospital course: Paula Garrett is a 57 year old female with history of hypertension, depression, anxiety, history of tobacco use, GERD, who presents to the emergency department for chief concerns of chest pain and shortness of breath for 1 month.  Patient also has known multiple myeloma which has been followed as outpatient without requiring any treatment. Imaging study showed large soft tissue mass in left paraspinal soft tissue at T6-T7 with destruction of seventh rib and invasion of T6 and T7 vertebral bodies involving left posterior elements as well as epidural tumor and spinal canal with moderate spinal canal stenosis without frank cord compression or cord edema, acute/subacute compression fracture of T11 vertebral body with up to 30% vertebral body height and minimal bony retropulsion, possibly pathologic. Right occipital calvarium with small enhancing lesion, suspicious for additional metastatic or myeloma lesion. Patient has been seen by neurosurgery, started on IV steroids.   Surgery 8/13: 1. Posterior Segmental Instrumentation T4 to T9; 2. Posterolateral arthrodesis from T4 to T9; 3. Thoracic Laminotomy at T6 and T7.  8/14.  Patient walked to the bathroom and her drain was disconnected.  Blood was dripping out of the drain.  I contacted neurosurgery and okay to clean with alcohol swab and hooked back the drain. 8/15.  Patient having a lot more pain today.  Having some cough.  Chest x-ray concerning for fluid overload.  IV fluids discontinued this morning.  Give a dose of Lasix. 8/16.  Patient was on high flow nasal cannula 6 L.  Lasix ordered twice daily.  Antibiotics empirically started just in case infection. 8/17.  Down to 2 L.  Try to taper off oxygen.  Continue Lasix twice daily.  Repeat chest x-ray consistent with fluid overload.   Echocardiogram showed a normal EF.  Assessment and Plan: * Acute hypoxic respiratory failure (HCC) Patient had a pulse ox of 86% on nasal cannula on 8/16.  Patient placed on high flow nasal cannula on 8/16.  Patient on IV diuresis with Lasix 40 mg IV twice daily.  Down to 2 L.  Check pulse ox on room air.  Fluid overload Chest x-ray concerning for fluid overload.  IV Lasix twice daily.  Echocardiogram shows normal ejection fraction.  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  Follow-up outpatient oncology.  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 Katrinka Blazing.  Patient had a posterior segmental instrumentation T4-T9, posterior lateral arthrodesis from T4-T9, thoracic laminectomy at T6 and T7.  Patient with more pain last 2 days.  Palliative care added OxyContin and adjusted pain medications.  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  Pain better controlled today.  Drug induced constipation Lactulose made as needed.  Hypercalcemia Last calcium 11.0.  Check again tomorrow.  Acute blood loss anemia Hemoglobin 7.7 today.  May end up needing a blood transfusion.  AKI (acute kidney injury) (HCC) Watch creatinine with IV Lasix.  (Creatinine went up to 1.05 from a baseline creatinine of 0.65.) last creatinine 1.04 with diuresis.  Hyponatremia Patient on salt tablets.  Sodium slowly rising to 132.  Benign essential hypertension Hold Norvasc and Avapro.  IV Lasix.  Barrett's esophagus without dysplasia Continue Protonix  Anxiety Continue Lexapro and clonazepam  Obesity (BMI 30-39.9) Last BMI 31.09  Asthma nebulizer standing dose.  Hypothyroidism Continue levothyroxine.  Insomnia As  needed Ambien        Subjective: Patient feels pain is better controlled today.  Not having much shortness of breath.  Down to 2 L.  Admitted with shortness of breath and chest pain found to have a mass on spinal cord.  Physical  Exam: Vitals:   02/01/23 0755 02/01/23 0847 02/01/23 0856 02/01/23 1127  BP:  (!) 89/48 (!) 109/56   Pulse:  80 83   Resp:  20    Temp:  98.6 F (37 C)    TempSrc:      SpO2: 97% 95%  95%  Weight:      Height:       Physical Exam HENT:     Head: Normocephalic.     Mouth/Throat:     Pharynx: No oropharyngeal exudate.  Eyes:     General: Lids are normal.     Conjunctiva/sclera: Conjunctivae normal.  Cardiovascular:     Rate and Rhythm: Normal rate and regular rhythm.     Heart sounds: Normal heart sounds, S1 normal and S2 normal.  Pulmonary:     Breath sounds: Examination of the right-lower field reveals decreased breath sounds and rhonchi. Examination of the left-lower field reveals decreased breath sounds and rhonchi. Decreased breath sounds and rhonchi present. No wheezing or rales.  Abdominal:     Palpations: Abdomen is soft.     Tenderness: There is no abdominal tenderness.  Musculoskeletal:     Right lower leg: No swelling.     Left lower leg: No swelling.  Skin:    General: Skin is warm.     Findings: No rash.  Neurological:     Mental Status: She is alert and oriented to person, place, and time.     Data Reviewed: Sodium 132, creatinine 1.04, calcium 11.0, hemoglobin 7.7  Family Communication: Declined  Disposition: Status is: Inpatient Remains inpatient appropriate because: Trying to get off oxygen prior to disposition.  Neurosurgery wants to keep wound VAC on until Monday.  Planned Discharge Destination: Home    Time spent: 28 minutes  Author: Alford Highland, MD 02/01/2023 2:50 PM  For on call review [email or web address].

## 2023-02-01 NOTE — Progress Notes (Addendum)
Physical Therapy Treatment Patient Details Name: Paula Garrett MRN: [id number] DOB: Feb 19, 1966 Today's Date: 02/01/2023   History of Present Illness Pt is a 57 year old female presenting to the ED with chest pain and SOB. Imaging study showed large soft tissue mass in left paraspinal soft tissue at T6-T7 with destruction of seventh rib and invasion of T6 and T7 vertebral bodies involving left posterior elements as well as epidural tumor and spinal canal with moderate spinal canal stenosis without frank cord compression or cord edema, acute/subacute compression fracture of T11 vertebral body with up to 30% vertebral body height and minimal bony retropulsion, possibly pathologic. Right occipital calvarium with small enhancing lesion, suspicious for additional metastatic or myeloma lesion.Pt is now s/p Posterior Segmental Instrumentation T4 to T9, Posterolateral arthrodesis from T4 to T9 ,Thoracic Laminotomy at T6 and T7 01/28/23.        PMH significant for HTN, depression, anxiety, history of tobacco use, GERD, known multiple myeloma followed as outpatient    PT Comments  Pt seen for PT tx with pt agreeable. Pt presents with some decreased awareness as pt finishing breathing treatment but it's falling out of her mouth. After breathing treatment, pt is able to ambulate without AD but with min assist, multiple LOB with assistance to correct. Pt then ambulates again with RW & CGA, still 1 LOB with min assist to correct. Pt does report she plans to use RW at d/c & PT reinforces this. Pt toileted with supervision. Will continue to follow pt acutely to address balance, gait with LRAD, & safety with mobility.  Pt on 2L/min via nasal cannula throughout session, SpO2 91% or > during session, pt without c/o SOB.   If plan is discharge home, recommend the following: A little help with walking and/or transfers;A little help with bathing/dressing/bathroom;Help with stairs or ramp for entrance;Direct supervision/assist  for medications management;Assistance with cooking/housework;Assist for transportation   Can travel by private vehicle        Equipment Recommendations  Rolling walker (2 wheels)    Recommendations for Other Services       Precautions / Restrictions Precautions Precautions: Fall Precaution Comments: back wound vac, new O2 Restrictions Weight Bearing Restrictions: No     Mobility  Bed Mobility Overal bed mobility: Modified Independent Bed Mobility: Supine to Sit     Supine to sit: Modified independent (Device/Increase time), HOB elevated          Transfers Overall transfer level: Needs assistance Equipment used: None Transfers: Sit to/from Stand Sit to Stand: Supervision           General transfer comment: STS from EOB, toilet, cuing to use grab bars during STS from toilet    Ambulation/Gait Ambulation/Gait assistance: Contact guard assist, Min assist Gait Distance (Feet): 200 Feet (+ 200 ft) Assistive device: None, Rolling walker (2 wheels) Gait Pattern/deviations: Decreased step length - left, Decreased dorsiflexion - left, Decreased dorsiflexion - right, Decreased step length - right, Decreased stride length Gait velocity: decreased     General Gait Details: Pt ambulates 1 lap around nurses station without AD with min assist, occasional LOB with mod assist to correct. Pt ambulates again in hallway with RW & CGA, min assist 2/2 1 LOB.   Stairs             Wheelchair Mobility     Tilt Bed    Modified Rankin (Stroke Patients Only)       Balance Overall balance assessment: Needs assistance Sitting-balance support: Feet supported  Sitting balance-Leahy Scale: Good     Standing balance support: No upper extremity supported, During functional activity Standing balance-Leahy Scale: Poor                              Cognition Arousal: Alert Behavior During Therapy: WFL for tasks assessed/performed Overall Cognitive Status: No  family/caregiver present to determine baseline cognitive functioning                                 General Comments: Pt follows commands throughout session, but appears to have decreased safety awareness at times (standing up without PT beside her, multiple LOB with pt seeming to have decreased awareness)        Exercises Other Exercises Other Exercises: Pt performed 10x STS from chair with CGA<>min assist with focus on BLE strengthening & endurance training.    General Comments General comments (skin integrity, edema, etc.): Pt toilets with continent void.      Pertinent Vitals/Pain Pain Assessment Pain Assessment: No/denies pain    Home Living                          Prior Function            PT Goals (current goals can now be found in the care plan section) Acute Rehab PT Goals Patient Stated Goal: to go home PT Goal Formulation: With patient Time For Goal Achievement: 02/12/23 Potential to Achieve Goals: Good Progress towards PT goals: Progressing toward goals    Frequency    Min 1X/week      PT Plan      Co-evaluation              AM-PAC PT "6 Clicks" Mobility   Outcome Measure  Help needed turning from your back to your side while in a flat bed without using bedrails?: None Help needed moving from lying on your back to sitting on the side of a flat bed without using bedrails?: None Help needed moving to and from a bed to a chair (including a wheelchair)?: A Little Help needed standing up from a chair using your arms (e.g., wheelchair or bedside chair)?: A Little Help needed to walk in hospital room?: A Little Help needed climbing 3-5 steps with a railing? : A Little 6 Click Score: 20    End of Session Equipment Utilized During Treatment: Oxygen Activity Tolerance: Patient tolerated treatment well Patient left: in chair;with chair alarm set;with call bell/phone within reach Nurse Communication: Mobility status PT  Visit Diagnosis: Muscle weakness (generalized) (M62.81);Unsteadiness on feet (R26.81);Other abnormalities of gait and mobility (R26.89)     Time: 9147-8295 PT Time Calculation (min) (ACUTE ONLY): 21 min  Charges:    $Therapeutic Activity: 8-22 mins PT General Charges $$ ACUTE PT VISIT: 1 Visit                     Aleda Grana, PT, DPT 02/01/23, 12:09 PM   Sandi Mariscal 02/01/2023, 12:06 PM

## 2023-02-01 NOTE — Progress Notes (Signed)
Neurosurgery Progress Note  History: KONYA SCHILD is a 57 y.o s/p T4-9 PSF, T6-7 lamiotomy for tumor  POD4: Stable overnight. At baseline this morning POD3: Increased back pain this morning in addition to some SOB. Pt currently getting breathing treatment  POD2: Pt reporting more back pain this morning POD1: Some trouble with the drain coming disconnected overnight. Pt feels her back is well controlled and denies any concerns  Physical Exam: Vitals:   02/01/23 0847 02/01/23 0856  BP: (!) 89/48 (!) 109/56  Pulse: 80 83  Resp: 20   Temp: 98.6 F (37 C)   SpO2: 95%     AA Ox3 CNI  Strength:5/5 throughout BLE Incision covered with incisional wound VAC, dressing reinforced due to low grade leak  Data:  Other tests/results: Pathology pending   Assessment/Plan:  NATOYIA CASHDOLLAR is a 57 year old with a history of multiple myeloma found to have lytic lesions at T6 and T7 status post T4-9 posterior spinal fusion and T6-7 laminotomies.  - mobilize - pain control; will make some medication changes given patients increased pain level - ok for DVT prophylaxis - PTOT - Will remove wound vac prior to d/c  - remainder of care per primary team. - Please call with any questions or concerns.   Lovenia Kim MD Department of Neurosurgery

## 2023-02-01 NOTE — Plan of Care (Signed)

## 2023-02-02 DIAGNOSIS — G9589 Other specified diseases of spinal cord: Secondary | ICD-10-CM | POA: Diagnosis not present

## 2023-02-02 DIAGNOSIS — C9000 Multiple myeloma not having achieved remission: Secondary | ICD-10-CM | POA: Diagnosis not present

## 2023-02-02 DIAGNOSIS — E8779 Other fluid overload: Secondary | ICD-10-CM | POA: Diagnosis not present

## 2023-02-02 DIAGNOSIS — J9601 Acute respiratory failure with hypoxia: Secondary | ICD-10-CM | POA: Diagnosis not present

## 2023-02-02 LAB — BASIC METABOLIC PANEL
Anion gap: 9 (ref 5–15)
BUN: 35 mg/dL — ABNORMAL HIGH (ref 6–20)
CO2: 24 mmol/L (ref 22–32)
Calcium: 12.1 mg/dL — ABNORMAL HIGH (ref 8.9–10.3)
Chloride: 99 mmol/L (ref 98–111)
Creatinine, Ser: 1.36 mg/dL — ABNORMAL HIGH (ref 0.44–1.00)
GFR, Estimated: 45 mL/min — ABNORMAL LOW (ref >60)
Glucose, Bld: 105 mg/dL — ABNORMAL HIGH (ref 70–99)
Potassium: 4.5 mmol/L (ref 3.5–5.1)
Sodium: 132 mmol/L — ABNORMAL LOW (ref 135–145)

## 2023-02-02 LAB — CBC
HCT: 23.1 % — ABNORMAL LOW (ref 36.0–46.0)
Hemoglobin: 7.9 g/dL — ABNORMAL LOW (ref 12.0–15.0)
MCH: 32.4 pg (ref 26.0–34.0)
MCHC: 34.2 g/dL (ref 30.0–36.0)
MCV: 94.7 fL (ref 80.0–100.0)
Platelets: 284 10*3/uL (ref 150–400)
RBC: 2.44 MIL/uL — ABNORMAL LOW (ref 3.87–5.11)
RDW: 13.4 % (ref 11.5–15.5)
WBC: 8.6 10*3/uL (ref 4.0–10.5)
nRBC: 0 % (ref 0.0–0.2)

## 2023-02-02 LAB — TYPE AND SCREEN
ABO/RH(D): A POS
Antibody Screen: NEGATIVE

## 2023-02-02 MED ORDER — LACTULOSE 10 GM/15ML PO SOLN
30.0000 g | Freq: Once | ORAL | Status: AC
  Administered 2023-02-02: 30 g via ORAL
  Filled 2023-02-02: qty 60

## 2023-02-02 NOTE — Plan of Care (Signed)
  Problem: Education: Goal: Knowledge of General Education information will improve Description: Including pain rating scale, medication(s)/side effects and non-pharmacologic comfort measures Outcome: Progressing   Problem: Health Behavior/Discharge Planning: Goal: Ability to manage health-related needs will improve Outcome: Progressing   Problem: Clinical Measurements: Goal: Ability to maintain clinical measurements within normal limits will improve Outcome: Progressing Goal: Will remain free from infection Outcome: Progressing Goal: Diagnostic test results will improve Outcome: Progressing Goal: Respiratory complications will improve Outcome: Progressing Goal: Cardiovascular complication will be avoided Outcome: Progressing   Problem: Activity: Goal: Risk for activity intolerance will decrease Outcome: Not Progressing   Problem: Nutrition: Goal: Adequate nutrition will be maintained Outcome: Progressing   Problem: Coping: Goal: Level of anxiety will decrease Outcome: Progressing   Problem: Elimination: Goal: Will not experience complications related to bowel motility Outcome: Progressing Goal: Will not experience complications related to urinary retention Outcome: Progressing   Problem: Pain Managment: Goal: General experience of comfort will improve Outcome: Progressing   Problem: Education: Goal: Ability to verbalize activity precautions or restrictions will improve Outcome: Progressing Goal: Knowledge of the prescribed therapeutic regimen will improve Outcome: Progressing Goal: Understanding of discharge needs will improve Outcome: Progressing   Problem: Skin Integrity: Goal: Risk for impaired skin integrity will decrease Outcome: Progressing   Problem: Safety: Goal: Ability to remain free from injury will improve Outcome: Progressing   Problem: Activity: Goal: Ability to avoid complications of mobility impairment will improve Outcome:  Progressing Goal: Ability to tolerate increased activity will improve Outcome: Progressing Goal: Will remain free from falls Outcome: Progressing   Problem: Bowel/Gastric: Goal: Gastrointestinal status for postoperative course will improve Outcome: Progressing   Problem: Clinical Measurements: Goal: Ability to maintain clinical measurements within normal limits will improve Outcome: Progressing Goal: Postoperative complications will be avoided or minimized Outcome: Progressing Goal: Diagnostic test results will improve Outcome: Progressing   Problem: Pain Management: Goal: Pain level will decrease Outcome: Progressing   Problem: Skin Integrity: Goal: Will show signs of wound healing Outcome: Progressing   Problem: Health Behavior/Discharge Planning: Goal: Identification of resources available to assist in meeting health care needs will improve Outcome: Progressing   Problem: Bladder/Genitourinary: Goal: Urinary functional status for postoperative course will improve Outcome: Progressing

## 2023-02-02 NOTE — Progress Notes (Signed)
Progress Note   Patient: Paula Garrett ZOX:[id number] DOB: 10-May-1966 DOA: 01/24/2023     9 DOS: the patient was seen and examined on 02/02/2023   Brief hospital course: Paula Garrett is a 57 year old female with history of hypertension, depression, anxiety, history of tobacco use, GERD, who presents to the emergency department for chief concerns of chest pain and shortness of breath for 1 month.  Patient also has known multiple myeloma which has been followed as outpatient without requiring any treatment. Imaging study showed large soft tissue mass in left paraspinal soft tissue at T6-T7 with destruction of seventh rib and invasion of T6 and T7 vertebral bodies involving left posterior elements as well as epidural tumor and spinal canal with moderate spinal canal stenosis without frank cord compression or cord edema, acute/subacute compression fracture of T11 vertebral body with up to 30% vertebral body height and minimal bony retropulsion, possibly pathologic. Right occipital calvarium with small enhancing lesion, suspicious for additional metastatic or myeloma lesion. Patient has been seen by neurosurgery, started on IV steroids.   Surgery 8/13: 1. Posterior Segmental Instrumentation T4 to T9; 2. Posterolateral arthrodesis from T4 to T9; 3. Thoracic Laminotomy at T6 and T7.  8/14.  Patient walked to the bathroom and her drain was disconnected.  Blood was dripping out of the drain.  I contacted neurosurgery and okay to clean with alcohol swab and hooked back the drain. 8/15.  Patient having a lot more pain today.  Having some cough.  Chest x-ray concerning for fluid overload.  IV fluids discontinued this morning.  Give a dose of Lasix. 8/16.  Patient was on high flow nasal cannula 6 L.  Lasix ordered twice daily.  Antibiotics empirically started just in case infection. 8/17.  Down to 2 L.  Try to taper off oxygen.  Continue Lasix twice daily.  Repeat chest x-ray consistent with fluid overload.   Echocardiogram showed a normal EF. 8/18.  Hold Lasix with creatinine up to 1.37.  Calcium also increased with diuresis.  Will check again tomorrow.  Assessment and Plan: * Acute hypoxic respiratory failure (HCC) Patient had a pulse ox of 86% on nasal cannula on 8/16.  Patient placed on high flow nasal cannula on 8/16.  Patient on 2 L nasal cannula.  Check room air pulse ox with ambulation.  Holding Lasix with creatinine up to 1.36 today.   Fluid overload Chest x-ray concerning for fluid overload.  Hold Lasix with creatinine up to 1.36.  Echocardiogram shows normal ejection fraction.  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  Oncology message me about setting up a bone marrow biopsy for potentially tomorrow.  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 Katrinka Blazing.  Patient had a posterior segmental instrumentation T4-T9, posterior lateral arthrodesis from T4-T9, thoracic laminectomy at T6 and T7.  Palliative care added OxyContin and adjusted pain medications.  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  Pain better controlled today.  Drug induced constipation Lactulose dose today  Hypercalcemia Last calcium 12.1.   Likely increase in calcium is secondary to diuretics.  Recheck calcium tomorrow.  Case discussed with oncology.  Acute blood loss anemia Hemoglobin 7.9  today.  May end up needing a blood transfusion.  AKI (acute kidney injury) (HCC) Hold Lasix today with creatinine up to 1.36.  Recheck creatinine tomorrow.  Hyponatremia Patient on salt tablets.  Sodium slowly rising to 132.  Benign essential hypertension Holding medications.  Barrett's esophagus without dysplasia Continue  Protonix  Anxiety Continue Lexapro and clonazepam  Obesity (BMI 30-39.9) Last BMI 31.09  Asthma nebulizer standing dose.  Hypothyroidism Continue levothyroxine.  Insomnia As needed Ambien        Subjective: Patient's pain is better  controlled with long-acting pain medication.  Oncology potentially setting up a bone marrow biopsy for tomorrow.  Patient had back surgery for a spinal cord mass.  Patient feels like her breathing is better.    Physical Exam: Vitals:   02/01/23 1956 02/01/23 2017 02/02/23 0530 02/02/23 0742  BP:  129/66 123/72   Pulse:  82 74   Resp:  16 16   Temp:  97.8 F (36.6 C) (!) 97.5 F (36.4 C)   TempSrc:  Oral Oral   SpO2: 93% 98% 95% 95%  Weight:      Height:       Physical Exam HENT:     Head: Normocephalic.     Mouth/Throat:     Pharynx: No oropharyngeal exudate.  Eyes:     General: Lids are normal.     Conjunctiva/sclera: Conjunctivae normal.  Cardiovascular:     Rate and Rhythm: Normal rate and regular rhythm.     Heart sounds: Normal heart sounds, S1 normal and S2 normal.  Pulmonary:     Breath sounds: Examination of the right-lower field reveals decreased breath sounds and rhonchi. Examination of the left-lower field reveals decreased breath sounds and rhonchi. Decreased breath sounds and rhonchi present. No wheezing or rales.  Abdominal:     Palpations: Abdomen is soft.     Tenderness: There is no abdominal tenderness.  Musculoskeletal:     Right lower leg: No swelling.     Left lower leg: No swelling.  Skin:    General: Skin is warm.     Findings: No rash.  Neurological:     Mental Status: She is alert and oriented to person, place, and time.     Data Reviewed: Sodium 132, creatinine 1.36, calcium 12.1, hemoglobin 7.9  Family Communication: Declined  Disposition: Status is: Inpatient Remains inpatient appropriate because: Oncology just messaged me about probably setting up a bone marrow biopsy for tomorrow.  Trying to get off oxygen prior to disposition.  Neurosurgery to evaluate on whether wound VAC can come off tomorrow  Planned Discharge Destination: Home    Time spent: 28 minutes  Author: Alford Highland, MD 02/02/2023 1:59 PM  For on call review  [email or web address]

## 2023-02-02 NOTE — Plan of Care (Signed)
  Problem: Education: Goal: Knowledge of General Education information will improve Description: Including pain rating scale, medication(s)/side effects and non-pharmacologic comfort measures Outcome: Progressing   Problem: Health Behavior/Discharge Planning: Goal: Ability to manage health-related needs will improve Outcome: Progressing   Problem: Clinical Measurements: Goal: Will remain free from infection Outcome: Progressing Goal: Respiratory complications will improve Outcome: Progressing   Problem: Activity: Goal: Risk for activity intolerance will decrease Outcome: Progressing   Problem: Nutrition: Goal: Adequate nutrition will be maintained Outcome: Progressing   Problem: Coping: Goal: Level of anxiety will decrease Outcome: Progressing   Problem: Elimination: Goal: Will not experience complications related to urinary retention Outcome: Progressing   Problem: Pain Managment: Goal: General experience of comfort will improve Outcome: Progressing   Problem: Skin Integrity: Goal: Risk for impaired skin integrity will decrease Outcome: Progressing   Problem: Education: Goal: Knowledge of the prescribed therapeutic regimen will improve Outcome: Progressing Goal: Understanding of discharge needs will improve Outcome: Progressing   Problem: Activity: Goal: Ability to tolerate increased activity will improve Outcome: Progressing   Problem: Bowel/Gastric: Goal: Gastrointestinal status for postoperative course will improve Outcome: Progressing   Problem: Health Behavior/Discharge Planning: Goal: Identification of resources available to assist in meeting health care needs will improve Outcome: Progressing   Problem: Bladder/Genitourinary: Goal: Urinary functional status for postoperative course will improve Outcome: Progressing

## 2023-02-03 ENCOUNTER — Inpatient Hospital Stay: Payer: Managed Care, Other (non HMO)

## 2023-02-03 ENCOUNTER — Ambulatory Visit: Payer: Managed Care, Other (non HMO) | Admitting: Family Medicine

## 2023-02-03 DIAGNOSIS — G9589 Other specified diseases of spinal cord: Secondary | ICD-10-CM | POA: Diagnosis not present

## 2023-02-03 DIAGNOSIS — E8779 Other fluid overload: Secondary | ICD-10-CM | POA: Diagnosis not present

## 2023-02-03 DIAGNOSIS — J9601 Acute respiratory failure with hypoxia: Secondary | ICD-10-CM | POA: Diagnosis not present

## 2023-02-03 DIAGNOSIS — R410 Disorientation, unspecified: Secondary | ICD-10-CM

## 2023-02-03 LAB — BASIC METABOLIC PANEL
Anion gap: 8 (ref 5–15)
BUN: 32 mg/dL — ABNORMAL HIGH (ref 6–20)
CO2: 27 mmol/L (ref 22–32)
Calcium: 12.7 mg/dL — ABNORMAL HIGH (ref 8.9–10.3)
Chloride: 97 mmol/L — ABNORMAL LOW (ref 98–111)
Creatinine, Ser: 1.17 mg/dL — ABNORMAL HIGH (ref 0.44–1.00)
GFR, Estimated: 54 mL/min — ABNORMAL LOW (ref >60)
Glucose, Bld: 85 mg/dL (ref 70–99)
Potassium: 4.9 mmol/L (ref 3.5–5.1)
Sodium: 132 mmol/L — ABNORMAL LOW (ref 135–145)

## 2023-02-03 LAB — CBC
HCT: 24.1 % — ABNORMAL LOW (ref 36.0–46.0)
Hemoglobin: 7.9 g/dL — ABNORMAL LOW (ref 12.0–15.0)
MCH: 31.7 pg (ref 26.0–34.0)
MCHC: 32.8 g/dL (ref 30.0–36.0)
MCV: 96.8 fL (ref 80.0–100.0)
Platelets: 339 10*3/uL (ref 150–400)
RBC: 2.49 MIL/uL — ABNORMAL LOW (ref 3.87–5.11)
RDW: 13.2 % (ref 11.5–15.5)
WBC: 8.9 10*3/uL (ref 4.0–10.5)
nRBC: 0 % (ref 0.0–0.2)

## 2023-02-03 MED ORDER — NALOXONE HCL 0.4 MG/ML IJ SOLN: 0.4000 mg | INTRAMUSCULAR | Status: DC | PRN

## 2023-02-03 MED ORDER — ALBUTEROL SULFATE (2.5 MG/3ML) 0.083% IN NEBU
2.5000 mg | INHALATION_SOLUTION | Freq: Two times a day (BID) | RESPIRATORY_TRACT | Status: DC
  Administered 2023-02-03 – 2023-02-06 (×6): 2.5 mg via RESPIRATORY_TRACT
  Filled 2023-02-03 (×6): qty 3

## 2023-02-03 MED ORDER — OXYCODONE HCL 5 MG PO TABS: 5.0000 mg | ORAL_TABLET | ORAL | Status: DC | PRN

## 2023-02-03 MED ORDER — SODIUM CHLORIDE 0.9 % IV SOLN
60.0000 mg | Freq: Once | INTRAVENOUS | Status: AC
  Administered 2023-02-03: 60 mg via INTRAVENOUS
  Filled 2023-02-03: qty 20

## 2023-02-03 MED ORDER — CLONAZEPAM 0.5 MG PO TABS
1.0000 mg | ORAL_TABLET | Freq: Two times a day (BID) | ORAL | Status: DC | PRN
  Administered 2023-02-04 – 2023-02-05 (×2): 1 mg via ORAL
  Filled 2023-02-03 (×3): qty 2

## 2023-02-03 MED ORDER — GABAPENTIN 100 MG PO CAPS: 100.0000 mg | ORAL_CAPSULE | Freq: Three times a day (TID) | ORAL | Status: DC

## 2023-02-03 NOTE — Plan of Care (Signed)
  Problem: Education: Goal: Knowledge of General Education information will improve Description: Including pain rating scale, medication(s)/side effects and non-pharmacologic comfort measures Outcome: Progressing   Problem: Health Behavior/Discharge Planning: Goal: Ability to manage health-related needs will improve Outcome: Progressing   Problem: Clinical Measurements: Goal: Ability to maintain clinical measurements within normal limits will improve Outcome: Progressing Goal: Will remain free from infection Outcome: Progressing Goal: Diagnostic test results will improve Outcome: Progressing Goal: Respiratory complications will improve Outcome: Progressing Goal: Cardiovascular complication will be avoided Outcome: Progressing   Problem: Activity: Goal: Risk for activity intolerance will decrease Outcome: Progressing   Problem: Nutrition: Goal: Adequate nutrition will be maintained Outcome: Progressing   Problem: Coping: Goal: Level of anxiety will decrease Outcome: Progressing   Problem: Elimination: Goal: Will not experience complications related to bowel motility Outcome: Progressing Goal: Will not experience complications related to urinary retention Outcome: Progressing   Problem: Pain Managment: Goal: General experience of comfort will improve Outcome: Progressing   Problem: Safety: Goal: Ability to remain free from injury will improve Outcome: Progressing   Problem: Skin Integrity: Goal: Risk for impaired skin integrity will decrease Outcome: Progressing   Problem: Education: Goal: Ability to verbalize activity precautions or restrictions will improve Outcome: Progressing Goal: Knowledge of the prescribed therapeutic regimen will improve Outcome: Progressing Goal: Understanding of discharge needs will improve Outcome: Progressing   Problem: Activity: Goal: Ability to avoid complications of mobility impairment will improve Outcome: Progressing Goal:  Ability to tolerate increased activity will improve Outcome: Progressing Goal: Will remain free from falls Outcome: Progressing   Problem: Bowel/Gastric: Goal: Gastrointestinal status for postoperative course will improve Outcome: Not Progressing   Problem: Clinical Measurements: Goal: Ability to maintain clinical measurements within normal limits will improve Outcome: Progressing Goal: Postoperative complications will be avoided or minimized Outcome: Progressing Goal: Diagnostic test results will improve Outcome: Progressing   Problem: Pain Management: Goal: Pain level will decrease Outcome: Progressing   Problem: Skin Integrity: Goal: Will show signs of wound healing Outcome: Progressing   Problem: Health Behavior/Discharge Planning: Goal: Identification of resources available to assist in meeting health care needs will improve Outcome: Progressing   Problem: Bladder/Genitourinary: Goal: Urinary functional status for postoperative course will improve Outcome: Progressing

## 2023-02-03 NOTE — Progress Notes (Signed)
Progress Note   Patient: Paula Garrett UJW:[id number] DOB: [birth year] DOA: 01/24/2023     10 DOS: the patient was seen and examined on 02/03/2023   Brief hospital course: [name] is a 57 year old female with history of hypertension, depression, anxiety, history of tobacco use, GERD, who presents to the emergency department for chief concerns of chest pain and shortness of breath for 1 month.  Patient also has known multiple myeloma which has been followed as outpatient without requiring any treatment. Imaging study showed large soft tissue mass in left paraspinal soft tissue at T6-T7 with destruction of seventh rib and invasion of T6 and T7 vertebral bodies involving left posterior elements as well as epidural tumor and spinal canal with moderate spinal canal stenosis without frank cord compression or cord edema, acute/subacute compression fracture of T11 vertebral body with up to 30% vertebral body height and minimal bony retropulsion, possibly pathologic. Right occipital calvarium with small enhancing lesion, suspicious for additional metastatic or myeloma lesion. Patient has been seen by neurosurgery, started on IV steroids.   Surgery 8/13: 1. Posterior Segmental Instrumentation T4 to T9; 2. Posterolateral arthrodesis from T4 to T9; 3. Thoracic Laminotomy at T6 and T7.  8/14.  Patient walked to the bathroom and her drain was disconnected.  Blood was dripping out of the drain.  I contacted neurosurgery and okay to clean with alcohol swab and hooked back the drain. 8/15.  Patient having a lot more pain today.  Having some cough.  Chest x-ray concerning for fluid overload.  IV fluids discontinued this morning.  Give a dose of Lasix. 8/16.  Patient was on high flow nasal cannula 6 L.  Lasix ordered twice daily.  Antibiotics empirically started just in case infection. 8/17.  Down to 2 L.  Try to taper off oxygen.  Continue Lasix twice daily.  Repeat chest x-ray consistent with fluid overload.   Echocardiogram showed a normal EF. 8/18.  Hold Lasix with creatinine up to 1.37.  Calcium also increased with diuresis.  Will check again tomorrow. 8/19.  Physical therapist concerned that she was more confused.  Unable to come off oxygen today.  Patient able to walk on 100 feet today.  Calcium rose up to 12.7 and pamidronate ordered.  CT scan head ordered.  Assessment and Plan: * Hypercalcemia Last calcium 12.9.   Likely increase in calcium is secondary to diuretics.  Since calcium is rising pamidronate ordered.  Acute hypoxic respiratory failure (HCC) Patient had a pulse ox of 86% on nasal cannula on 8/16.  Patient placed on high flow nasal cannula on 8/16.  Patient unable to come off oxygen today with saturations into the 80s.   Fluid overload Chest x-ray concerning for fluid overload.  Hold Lasix today.  Echocardiogram with a normal EF of 60%.  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  Bone marrow biopsy can be done on Wednesday.  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 Katrinka Blazing.  Patient had a posterior segmental instrumentation T4-T9, posterior lateral arthrodesis from T4-T9, thoracic laminectomy at T6 and T7.  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  Pain control.  Confusion Decrease dose of gabapentin.  Could be pain medication related to her hypercalcemia related.  CT scan of the head ordered.  Drug induced constipation Continue medications for constipation.  Acute blood loss anemia Hemoglobin 7.9  today.  May end up needing a blood transfusion.  AKI (acute kidney injury) (HCC) Hold Lasix today  with creatinine up to 1.36.  Recheck creatinine tomorrow.  Hyponatremia Patient on salt tablets.  Sodium slowly rising to 132.  Benign essential hypertension Holding medications.  Barrett's esophagus without dysplasia Continue Protonix  Anxiety Continue Lexapro and clonazepam  Obesity (BMI 30-39.9) Last BMI  36.45.  Asthma nebulizer standing dose.  Hypothyroidism Continue levothyroxine.  Insomnia As needed Ambien        Subjective: Patient feels okay.  Still having back pain.  States she is not having any shortness of breath.  Physical Exam: Vitals:   02/03/23 0353 02/03/23 0430 02/03/23 0624 02/03/23 0801  BP: (!) 142/82   (!) 148/81  Pulse: 81   86  Resp: 17   18  Temp: 97.7 F (36.5 C)   98 F (36.7 C)  TempSrc: Oral   Oral  SpO2: 90% 98%  91%  Weight:   90.4 kg   Height:       Physical Exam HENT:     Head: Normocephalic.     Mouth/Throat:     Pharynx: No oropharyngeal exudate.  Eyes:     General: Lids are normal.     Conjunctiva/sclera: Conjunctivae normal.  Cardiovascular:     Rate and Rhythm: Normal rate and regular rhythm.     Heart sounds: Normal heart sounds, S1 normal and S2 normal.  Pulmonary:     Breath sounds: Examination of the right-lower field reveals decreased breath sounds and rhonchi. Examination of the left-lower field reveals decreased breath sounds and rhonchi. Decreased breath sounds and rhonchi present. No wheezing or rales.  Musculoskeletal:     Right lower leg: No swelling.     Left lower leg: No swelling.  Skin:    General: Skin is warm.     Findings: No rash.  Neurological:     Mental Status: She is alert.     Comments: For me answers all questions appropriately.  With physical therapist seems more confused.     Data Reviewed: Creatinine 1.17, sodium 132, hemoglobin 7.9  Disposition: Status is: Inpatient Remains inpatient appropriate because: Unable to come off oxygen today.  Giving pamidronate because calcium up to 12.9.  Patient has some confusion today will get a CT scan of the head but likely secondary to hypercalcemia.  Planned Discharge Destination: Home    Time spent: 30 minutes Case discussed with oncology, physical therapy.  Author: Alford Highland, MD 02/03/2023 4:19 PM  For on call review [email or web address].

## 2023-02-03 NOTE — Progress Notes (Signed)
Physical Therapy Treatment Patient Details Name: Paula Garrett MRN: [id number] DOB: 26-Jan-1966 Today's Date: 02/03/2023   History of Present Illness Pt is a 57 year old female presenting to the ED with chest pain and SOB. Imaging study showed large soft tissue mass in left paraspinal soft tissue at T6-T7 with destruction of seventh rib and invasion of T6 and T7 vertebral bodies involving left posterior elements as well as epidural tumor and spinal canal with moderate spinal canal stenosis without frank cord compression or cord edema, acute/subacute compression fracture of T11 vertebral body with up to 30% vertebral body height and minimal bony retropulsion, possibly pathologic. Right occipital calvarium with small enhancing lesion, suspicious for additional metastatic or myeloma lesion.Pt is now s/p Posterior Segmental Instrumentation T4 to T9, Posterolateral arthrodesis from T4 to T9 ,Thoracic Laminotomy at T6 and T7 01/28/23.        PMH significant for HTN, depression, anxiety, history of tobacco use, GERD, known multiple myeloma followed as outpatient    PT Comments  Pt seen for PT tx with pt agreeable. Pt received in bed with lights out but awakened & agreeable to tx. Pt appears lethargic, requiring cuing for increased alertness. Pt is able to ambulate without AD with min assist with impaired gait pattern as noted below. Pt ambulates with RW & CGA. Attempted to have pt perform standing balance activity but pt with poor ability to return demo. Pt with impaired cognition, problem solving, safety awareness & team made aware of PT's concerns.    If plan is discharge home, recommend the following: A little help with walking and/or transfers;A little help with bathing/dressing/bathroom;Help with stairs or ramp for entrance;Direct supervision/assist for medications management;Assistance with cooking/housework;Assist for transportation   Can travel by private vehicle        Equipment Recommendations   Rolling walker (2 wheels)    Recommendations for Other Services       Precautions / Restrictions Precautions Precautions: Fall Precaution Comments: back wound vac, new O2 Restrictions Weight Bearing Restrictions: No     Mobility  Bed Mobility Overal bed mobility: Modified Independent Bed Mobility: Supine to Sit, Sit to Supine     Supine to sit: Modified independent (Device/Increase time), HOB elevated Sit to supine: Modified independent (Device/Increase time), HOB elevated        Transfers Overall transfer level: Needs assistance Equipment used: None Transfers: Sit to/from Stand Sit to Stand: Supervision           General transfer comment: STS from EOB    Ambulation/Gait Ambulation/Gait assistance: Min assist, Contact guard assist Gait Distance (Feet): 100 Feet Assistive device: None, Rolling walker (2 wheels) Gait Pattern/deviations: Decreased step length - left, Decreased dorsiflexion - left, Decreased dorsiflexion - right, Decreased step length - right, Decreased stride length Gait velocity: decreased     General Gait Details: decreased weight shift to LLE during stance phase, decreased foot clearance RLE during swing phase. When ambulating with RW pt leaning & RW coming up off of ground & pt with decreased awareness of this.   Stairs             Wheelchair Mobility     Tilt Bed    Modified Rankin (Stroke Patients Only)       Balance Overall balance assessment: Needs assistance Sitting-balance support: Feet supported Sitting balance-Leahy Scale: Good     Standing balance support: During functional activity, No upper extremity supported Standing balance-Leahy Scale: Poor  Cognition Arousal: Lethargic Behavior During Therapy: WFL for tasks assessed/performed Overall Cognitive Status: No family/caregiver present to determine baseline cognitive functioning                                  General Comments: Pt not oriented to month (reports current month is April or May, but is oriented to year), poor ability to follow simple commands or return demonstrate tasks, poor safety awareness, recall, problem solving.        Exercises Other Exercises Other Exercises: Attempted to have pt perform standing step taps with alternating BLE but pt unable to return demo even with max cuing.    General Comments General comments (skin integrity, edema, etc.): Attempted to wean pt from 1L/min to room air but SpO2 drops as low as 83% (inconsistent pleth waveform) so pt placed back on 1L/min for remainder of session. Pt denies SOB.      Pertinent Vitals/Pain Pain Assessment Pain Assessment: No/denies pain    Home Living                          Prior Function            PT Goals (current goals can now be found in the care plan section) Acute Rehab PT Goals Patient Stated Goal: to go home PT Goal Formulation: With patient Time For Goal Achievement: 02/12/23 Potential to Achieve Goals: Good Progress towards PT goals: Progressing toward goals    Frequency    Min 1X/week      PT Plan      Co-evaluation              AM-PAC PT "6 Clicks" Mobility   Outcome Measure  Help needed turning from your back to your side while in a flat bed without using bedrails?: None Help needed moving from lying on your back to sitting on the side of a flat bed without using bedrails?: None Help needed moving to and from a bed to a chair (including a wheelchair)?: A Little Help needed standing up from a chair using your arms (e.g., wheelchair or bedside chair)?: A Little Help needed to walk in hospital room?: A Little Help needed climbing 3-5 steps with a railing? : A Little 6 Click Score: 20    End of Session Equipment Utilized During Treatment: Oxygen Activity Tolerance: Patient tolerated treatment well Patient left: with chair alarm set;in chair;with call bell/phone  within reach Nurse Communication: Mobility status (notified MD & nurse of pt's impaired cognition) PT Visit Diagnosis: Muscle weakness (generalized) (M62.81);Unsteadiness on feet (R26.81);Other abnormalities of gait and mobility (R26.89)     Time: 1045-1110 PT Time Calculation (min) (ACUTE ONLY): 25 min  Charges:    $Therapeutic Activity: 23-37 mins PT General Charges $$ ACUTE PT VISIT: 1 Visit                     Aleda Grana, PT, DPT 02/03/23, 12:04 PM   Sandi Mariscal 02/03/2023, 12:03 PM

## 2023-02-03 NOTE — Progress Notes (Signed)
At 2200, assisted patient in ambulating to bathroom. Patient voided and performed night time ADLs while on room air. Patient returned to bed. O2 reading 98% on room air. Placed [place] on 1 lpm and instructed patient to call for assistance if she felt short of breathe. Assessed patient O2 sporadically during night with 02 ranging from 93-98% on 1 lpm .

## 2023-02-03 NOTE — Plan of Care (Signed)

## 2023-02-03 NOTE — Progress Notes (Signed)
Occupational Therapy Treatment Patient Details Name: Paula Garrett MRN: [id number] DOB: 1965-12-15 Today's Date: 02/03/2023   History of present illness Pt is a 57 year old female presenting to the ED with chest pain and SOB. Imaging study showed large soft tissue mass in left paraspinal soft tissue at T6-T7 with destruction of seventh rib and invasion of T6 and T7 vertebral bodies involving left posterior elements as well as epidural tumor and spinal canal with moderate spinal canal stenosis without frank cord compression or cord edema, acute/subacute compression fracture of T11 vertebral body with up to 30% vertebral body height and minimal bony retropulsion, possibly pathologic. Right occipital calvarium with small enhancing lesion, suspicious for additional metastatic or myeloma lesion.Pt is now s/p Posterior Segmental Instrumentation T4 to T9, Posterolateral arthrodesis from T4 to T9 ,Thoracic Laminotomy at T6 and T7 01/28/23.        PMH significant for HTN, depression, anxiety, history of tobacco use, GERD, known multiple myeloma followed as outpatient   OT comments  Upon entering the room, pt supine in bed with RN present and agreeable to OT intervention. Pt appears very lethargic and is given wet cloth in an attempt alert further for session. Pt performs bed mobility without assistance and combs matted hair while seated on EOB with close supervision. Pt fatigues quickly and reports needing to lay down and returns herself to supine. Meal tray has arrived and OT placed it in front of pt at end of session. Call bell and all needed items within reach upon exiting the room.       If plan is discharge home, recommend the following:  A little help with walking and/or transfers;A little help with bathing/dressing/bathroom;Assist for transportation;Help with stairs or ramp for entrance;Assistance with cooking/housework   Equipment Recommendations  Tub/shower bench       Precautions / Restrictions  Precautions Precautions: Fall;Back Precaution Comments: back wound vac, new O2 Required Braces or Orthoses: Spinal Brace Spinal Brace: Thoracolumbosacral orthotic Restrictions Weight Bearing Restrictions: No       Mobility Bed Mobility Overal bed mobility: Modified Independent             General bed mobility comments: encourage logroll technique    Transfers                         Balance Overall balance assessment: Needs assistance Sitting-balance support: Feet supported Sitting balance-Leahy Scale: Good                                     ADL either performed or assessed with clinical judgement   ADL Overall ADL's : Needs assistance/impaired     Grooming: Sitting;Set up;Wash/dry face;Brushing hair;Supervision/safety                                      Extremity/Trunk Assessment Upper Extremity Assessment Upper Extremity Assessment: Overall WFL for tasks assessed   Lower Extremity Assessment Lower Extremity Assessment: Overall WFL for tasks assessed        Vision Patient Visual Report: No change from baseline            Cognition Arousal: Lethargic Behavior During Therapy: WFL for tasks assessed/performed Overall Cognitive Status: Impaired/Different from baseline  General Comments Attempted to wean pt from 1L/min to room air but SpO2 drops as low as 83% (inconsistent pleth waveform) so pt placed back on 1L/min for remainder of session. Pt denies SOB.    Pertinent Vitals/ Pain       Pain Assessment Pain Assessment: No/denies pain         Frequency  Min 1X/week        Progress Toward Goals  OT Goals(current goals can now be found in the care plan section)  Progress towards OT goals: Progressing toward goals            AM-PAC OT "6 Clicks" Daily Activity     Outcome Measure   Help from another person eating meals?:  None Help from another person taking care of personal grooming?: None Help from another person toileting, which includes using toliet, bedpan, or urinal?: None Help from another person bathing (including washing, rinsing, drying)?: A Little Help from another person to put on and taking off regular upper body clothing?: None Help from another person to put on and taking off regular lower body clothing?: A Little 6 Click Score: 22    End of Session Equipment Utilized During Treatment: Rolling walker (2 wheels);Oxygen  OT Visit Diagnosis: Other abnormalities of gait and mobility (R26.89)   Activity Tolerance Patient limited by fatigue;Patient limited by lethargy   Patient Left in bed;with call bell/phone within reach;with bed alarm set   Nurse Communication Mobility status        Time: 1610-9604 OT Time Calculation (min): 14 min  Charges: OT General Charges $OT Visit: 1 Visit OT Treatments $Self Care/Home Management : 8-22 mins  Jackquline Denmark, MS, OTR/L , CBIS ascom [phone]  02/03/23, 3:41 PM

## 2023-02-03 NOTE — TOC Progression Note (Signed)
Transition of Care [facility]) - Progression Note    Patient Details  Name: Paula Garrett MRN: [id number] Date of Birth: [birth year]  [facility] CM/SW Contact  Allena Katz, LCSW Phone Number: 02/03/2023, 11:28 AM  Clinical Narrative:   CSW following. Neuro wanting to keep wound vac on until today. TOC following for care plan updates.    Expected Discharge Plan: Home w Home Health Services Barriers to Discharge: Continued Medical Work up  Expected Discharge Plan and Services     Post Acute Care Choice: Durable Medical Equipment, Home Health Living arrangements for the past 2 months: Apartment                                       Social Determinants of Health (SDOH) Interventions SDOH Screenings   Food Insecurity: No Food Insecurity (01/24/2023)  Housing: Low Risk  (01/24/2023)  Transportation Needs: No Transportation Needs (01/24/2023)  Utilities: Not At Risk (01/24/2023)  Alcohol Screen: Low Risk  (01/01/2023)  Depression (PHQ2-9): High Risk (01/01/2023)  Financial Resource Strain: Low Risk  (11/01/2022)  Physical Activity: Insufficiently Active (11/01/2022)  Social Connections: Socially Isolated (11/01/2022)  Stress: Stress Concern Present (11/01/2022)  Tobacco Use: High Risk (01/24/2023)    Readmission Risk Interventions     No data to display

## 2023-02-03 NOTE — Assessment & Plan Note (Signed)
Likely hypercalcemia related.  Patient with a lot more pain after cutting back pain medications yesterday.  Oncology went up on OxyContin dose today.  CT scan of the head negative

## 2023-02-03 NOTE — Progress Notes (Signed)
Orthopedic Tech Progress Note Patient Details:  [name] 08-31-1965 [id number]  Called in order to [employer] for a TLSO BRACE   Patient ID: Paula Garrett, female   DOB: [birth year], 57 y.o.   MRN: [id number]  Donald Pore 02/03/2023, 11:40 AM

## 2023-02-03 NOTE — Progress Notes (Signed)
TLSO brace called into ortho tech.  Will continue to monitor pt.

## 2023-02-04 ENCOUNTER — Other Ambulatory Visit: Payer: Self-pay | Admitting: Radiology

## 2023-02-04 DIAGNOSIS — E8779 Other fluid overload: Secondary | ICD-10-CM | POA: Diagnosis not present

## 2023-02-04 DIAGNOSIS — G9589 Other specified diseases of spinal cord: Secondary | ICD-10-CM | POA: Diagnosis not present

## 2023-02-04 DIAGNOSIS — Z01812 Encounter for preprocedural laboratory examination: Secondary | ICD-10-CM

## 2023-02-04 DIAGNOSIS — J9601 Acute respiratory failure with hypoxia: Secondary | ICD-10-CM | POA: Diagnosis not present

## 2023-02-04 LAB — CBC
HCT: 26.5 % — ABNORMAL LOW (ref 36.0–46.0)
Hemoglobin: 8.9 g/dL — ABNORMAL LOW (ref 12.0–15.0)
MCH: 32 pg (ref 26.0–34.0)
MCHC: 33.6 g/dL (ref 30.0–36.0)
MCV: 95.3 fL (ref 80.0–100.0)
Platelets: 397 10*3/uL (ref 150–400)
RBC: 2.78 MIL/uL — ABNORMAL LOW (ref 3.87–5.11)
RDW: 13.3 % (ref 11.5–15.5)
WBC: 11.9 10*3/uL — ABNORMAL HIGH (ref 4.0–10.5)
nRBC: 0 % (ref 0.0–0.2)

## 2023-02-04 LAB — BASIC METABOLIC PANEL
Anion gap: 7 (ref 5–15)
BUN: 24 mg/dL — ABNORMAL HIGH (ref 6–20)
CO2: 29 mmol/L (ref 22–32)
Calcium: 13.8 mg/dL (ref 8.9–10.3)
Chloride: 96 mmol/L — ABNORMAL LOW (ref 98–111)
Creatinine, Ser: 1.01 mg/dL — ABNORMAL HIGH (ref 0.44–1.00)
GFR, Estimated: >60 mL/min (ref >60)
Glucose, Bld: 91 mg/dL (ref 70–99)
Potassium: 4.8 mmol/L (ref 3.5–5.1)
Sodium: 132 mmol/L — ABNORMAL LOW (ref 135–145)

## 2023-02-04 LAB — CALCIUM: Calcium: 12.6 mg/dL — ABNORMAL HIGH (ref 8.9–10.3)

## 2023-02-04 MED ORDER — GABAPENTIN 100 MG PO CAPS
100.0000 mg | ORAL_CAPSULE | Freq: Three times a day (TID) | ORAL | Status: DC
  Administered 2023-02-04 – 2023-02-06 (×7): 100 mg via ORAL
  Filled 2023-02-04 (×7): qty 1

## 2023-02-04 MED ORDER — DEXAMETHASONE 4 MG PO TABS
4.0000 mg | ORAL_TABLET | Freq: Two times a day (BID) | ORAL | Status: DC
  Administered 2023-02-04 – 2023-02-06 (×5): 4 mg via ORAL
  Filled 2023-02-04 (×5): qty 1

## 2023-02-04 MED ORDER — OXYCODONE HCL 5 MG PO TABS: 5.0000 mg | ORAL_TABLET | ORAL | Status: DC | PRN

## 2023-02-04 MED ORDER — OXYCODONE HCL ER 20 MG PO T12A
20.0000 mg | EXTENDED_RELEASE_TABLET | Freq: Two times a day (BID) | ORAL | Status: DC
  Administered 2023-02-04 – 2023-02-06 (×5): 20 mg via ORAL
  Filled 2023-02-04 (×5): qty 1

## 2023-02-04 MED ORDER — OXYCODONE HCL 5 MG PO TABS
5.0000 mg | ORAL_TABLET | Freq: Once | ORAL | Status: AC
  Administered 2023-02-04: 5 mg via ORAL
  Filled 2023-02-04: qty 1

## 2023-02-04 MED ORDER — CALCITONIN (SALMON) 200 UNIT/ML IJ SOLN
4.0000 [IU]/kg | Freq: Two times a day (BID) | INTRAMUSCULAR | Status: AC
  Administered 2023-02-04 – 2023-02-05 (×4): 350 [IU] via INTRAMUSCULAR
  Filled 2023-02-04 (×4): qty 1.75

## 2023-02-04 MED ORDER — OXYCODONE HCL 5 MG PO TABS
10.0000 mg | ORAL_TABLET | ORAL | Status: DC | PRN
  Administered 2023-02-04 – 2023-02-06 (×5): 10 mg via ORAL
  Filled 2023-02-04 (×6): qty 2

## 2023-02-04 NOTE — Progress Notes (Signed)
Hematology/Oncology Consult note [facility]  Telephone:([phone] [phone]  Patient Care Team: Smitty Cords, DO as PCP - General (Family Medicine) Lemar Livings, Merrily Pew, MD (General Surgery) Shelia Media, MD (Internal Medicine) Creig Hines, MD as Consulting Physician (Oncology)   Name of the patient: Paula Garrett  [id number]  04-29-1966   Date of visit: 02/04/2023   Interval history-reports having left chest wall pain which has gotten worse and feels that her pain is not controlled.  There were reports that she was drowsy this morning although when I saw her she was alert and oriented to self place and time.  ECOG PS- 1 Pain scale- 6 Opioid associated constipation- no  Review of systems- Review of Systems  Musculoskeletal:        Left chest wall pain      Allergies  Allergen Reactions   Hctz [Hydrochlorothiazide]     Hyponatremia      Past Medical History:  Diagnosis Date   Anxiety    Arthritis    Asthma    Barrett's esophagus 2015   COVID-19    03/10/20   Depression    GERD (gastroesophageal reflux disease)    Hypertension    Hypothyroidism    Pneumonia    Smoldering myeloma    Thyroid disease      Past Surgical History:  Procedure Laterality Date   ABLATION  2006   APPLICATION OF INTRAOPERATIVE CT SCAN N/A 01/28/2023   Procedure: APPLICATION OF INTRAOPERATIVE CT SCAN;  Surgeon: Loreen Freud, MD;  Location: [facility] ORS;  Service: Neurosurgery;  Laterality: N/A;   BREAST CYST ASPIRATION Left 1998   BREAST SURGERY     cyst aspiration   BREAST SURGERY  1998   cyst aspiration   COLONOSCOPY  03/2014   COLONOSCOPY WITH PROPOFOL N/A 01/06/2020   Procedure: COLONOSCOPY WITH PROPOFOL;  Surgeon: Toledo, Boykin Nearing, MD;  Location: [facility] ENDOSCOPY;  Service: Gastroenterology;  Laterality: N/A;   ENDOMETRIAL ABLATION     ESOPHAGOGASTRODUODENOSCOPY (EGD) WITH PROPOFOL N/A 08/15/2015   Procedure:  ESOPHAGOGASTRODUODENOSCOPY (EGD) WITH PROPOFOL;  Surgeon: Christena Deem, MD;  Location: [facility] ENDOSCOPY;  Service: Endoscopy;  Laterality: N/A;   ESOPHAGOGASTRODUODENOSCOPY (EGD) WITH PROPOFOL N/A 01/22/2016   Procedure: ESOPHAGOGASTRODUODENOSCOPY (EGD) WITH PROPOFOL;  Surgeon: Christena Deem, MD;  Location: [facility] ENDOSCOPY;  Service: Endoscopy;  Laterality: N/A;   ESOPHAGOGASTRODUODENOSCOPY (EGD) WITH PROPOFOL N/A 01/06/2020   Procedure: ESOPHAGOGASTRODUODENOSCOPY (EGD) WITH PROPOFOL;  Surgeon: Toledo, Boykin Nearing, MD;  Location: [facility] ENDOSCOPY;  Service: Gastroenterology;  Laterality: N/A;   GASTRIC BYPASS  2005   HERNIA REPAIR     NASAL SINUS SURGERY     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   TOTAL THYROIDECTOMY     TUBAL LIGATION      Social History   Socioeconomic History   Marital status: Divorced    Spouse name: Not on file   Number of children: 2   Years of education: Not on file   Highest education level: GED or equivalent  Occupational History   Occupation: Part-time  Tobacco Use   Smoking status: Every Day    Current packs/day: 1.00    Average packs/day: 1 pack/day for 42.9 years (42.9 ttl pk-yrs)    Types: Cigarettes    Start date: 03/15/1980   Smokeless tobacco: Never  Vaping Use   Vaping status: Never Used  Substance and Sexual Activity   Alcohol use: Yes    Alcohol/week:  10.0 - 12.0 standard drinks of alcohol    Types: 8 - 10 Glasses of wine, 2 Standard drinks or equivalent per week   Drug use: No   Sexual activity: Yes    Partners: Male  Other Topics Concern   Not on file  Social History Narrative   Lives in [place] single, divorced       Work - 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       Diet - healthy diet   Exercise - limited   Caffeine use: daily   Social Determinants of Health   Financial Resource Strain: Low Risk  (11/01/2022)   Overall Financial Resource Strain (CARDIA)    Difficulty of Paying Living Expenses: Not very hard  Food  Insecurity: No Food Insecurity (01/24/2023)   Hunger Vital Sign    Worried About Running Out of Food in the Last Year: Never true    Ran Out of Food in the Last Year: Never true  Transportation Needs: No Transportation Needs (01/24/2023)   PRAPARE - Administrator, Civil Service (Medical): No    Lack of Transportation (Non-Medical): No  Physical Activity: Insufficiently Active (11/01/2022)   Exercise Vital Sign    Days of Exercise per Week: 3 days    Minutes of Exercise per Session: 20 min  Stress: Stress Concern Present (11/01/2022)   [employer] of Occupational Health - Occupational Stress Questionnaire    Feeling of Stress : Very much  Social Connections: Socially Isolated (11/01/2022)   Social Connection and Isolation Panel [NHANES]    Frequency of Communication with Friends and Family: More than three times a week    Frequency of Social Gatherings with Friends and Family: Once a week    Attends Religious Services: Never    Database administrator or Organizations: No    Attends Engineer, structural: Not on file    Marital Status: Divorced  Intimate Partner Violence: Not At Risk (01/24/2023)   Humiliation, Afraid, Rape, and Kick questionnaire    Fear of Current or Ex-Partner: No    Emotionally Abused: No    Physically Abused: No    Sexually Abused: No    Family History  Problem Relation Age of Onset   Heart disease Mother    COPD Mother    Arthritis Mother    Cancer Mother        uterine   Lupus Mother    Anxiety disorder Mother    Depression Mother    Drug abuse Mother    COPD Father    Arthritis Father    Heart disease Father    Heart attack Father    Alcohol abuse Father    Heart disease Brother    Heart attack Brother    Drug abuse Brother    Anxiety disorder Brother    Depression Brother    Heart disease Brother    Cancer Brother        liver cancer age 79 y.o   Diabetes Maternal Grandmother    Diabetes Maternal Grandfather    Diabetes  Paternal Grandmother    Diabetes Paternal Grandfather    Breast cancer Neg Hx      Current Facility-Administered Medications:    0.9 %  sodium chloride infusion, 250 mL, Intravenous, Continuous, Susanne Borders, [place], Last Rate: 1 mL/hr at 02/03/23 1855, Infusion Verify at 02/03/23 1855   acetaminophen (TYLENOL) tablet 650 mg, 650 mg, Oral, Q4H PRN, 650 mg at 02/04/23 0247 **OR** acetaminophen (  TYLENOL) suppository 650 mg, 650 mg, Rectal, Q4H PRN, Susanne Borders, PA   acetaminophen (TYLENOL) tablet 1,000 mg, 1,000 mg, Oral, Q6H, Susanne Borders, PA, 1,000 mg at 02/04/23 1044   albuterol (PROVENTIL) (2.5 MG/3ML) 0.083% nebulizer solution 2.5 mg, 2.5 mg, Nebulization, BID, Wieting, Richard, MD, 2.5 mg at 02/04/23 0744   alum & mag hydroxide-simeth (MAALOX/MYLANTA) 200-200-20 MG/5ML suspension 30 mL, 30 mL, Oral, Q4H PRN, Susanne Borders, PA, 30 mL at 01/29/23 0034   bisacodyl (DULCOLAX) suppository 10 mg, 10 mg, Rectal, Daily PRN, Susanne Borders, PA   calcitonin (MIACALCIN) injection 350 Units, 4 Units/kg, Intramuscular, BID, Wieting, Richard, MD, 350 Units at 02/04/23 1045   cefTRIAXone (ROCEPHIN) 2 g in sodium chloride 0.9 % 100 mL IVPB, 2 g, Intravenous, Q24H, Alford Highland, MD, Stopped at 02/03/23 1811   celecoxib (CELEBREX) capsule 200 mg, 200 mg, Oral, BID, Susanne Borders, PA, 200 mg at 02/04/23 8657   clonazePAM (KLONOPIN) tablet 1 mg, 1 mg, Oral, BID PRN, Alford Highland, MD, 1 mg at 02/04/23 0817   dexamethasone (DECADRON) tablet 4 mg, 4 mg, Oral, Q12H, Creig Hines, MD, 4 mg at 02/04/23 1045   docusate sodium (COLACE) capsule 100 mg, 100 mg, Oral, BID, Susanne Borders, PA, 100 mg at 02/04/23 0809   enoxaparin (LOVENOX) injection 40 mg, 40 mg, Subcutaneous, Q24H, Susanne Borders, [place], 40 mg at 02/04/23 0810   escitalopram (LEXAPRO) tablet 10 mg, 10 mg, Oral, Daily, Susanne Borders, PA, 10 mg at 02/04/23 0809   gabapentin (NEURONTIN) capsule 100 mg, 100 mg,  Oral, TID, Renae Gloss, Richard, MD, 100 mg at 02/04/23 0845   lactulose (CHRONULAC) 10 GM/15ML solution 10-20 g, 10-20 g, Oral, BID PRN, Borders, Daryl Eastern, NP, 20 g at 02/02/23 2206   levothyroxine (SYNTHROID) tablet 250 mcg, 250 mcg, Oral, QAC breakfast, Susanne Borders, PA, 250 mcg at 02/04/23 8469   magnesium citrate solution 1 Bottle, 1 Bottle, Oral, Once PRN, Susanne Borders, PA   menthol-cetylpyridinium (CEPACOL) lozenge 3 mg, 1 lozenge, Oral, PRN **OR** phenol (CHLORASEPTIC) mouth spray 1 spray, 1 spray, Mouth/Throat, PRN, Susanne Borders, PA   mometasone-formoterol [facility] 200-5 MCG/ACT inhaler 2 puff, 2 puff, Inhalation, BID, Susanne Borders, PA, 2 puff at 02/04/23 6295   naloxone [facility]) injection 0.4 mg, 0.4 mg, Intravenous, PRN, Alford Highland, MD   oxyCODONE (Oxy IR/ROXICODONE) immediate release tablet 10 mg, 10 mg, Oral, Q4H PRN, Alford Highland, MD   oxyCODONE (OXYCONTIN) 12 hr tablet 20 mg, 20 mg, Oral, Q12H, Creig Hines, MD, 20 mg at 02/04/23 1045   pantoprazole (PROTONIX) EC tablet 40 mg, 40 mg, Oral, BID AC, Susanne Borders, [place], 40 mg at 02/04/23 0809   polyethylene glycol (MIRALAX / GLYCOLAX) packet 17 g, 17 g, Oral, BID, Borders, Daryl Eastern, NP, 17 g at 02/04/23 2841   senna (SENOKOT) tablet 8.6 mg, 1 tablet, Oral, BID, Susanne Borders, PA, 8.6 mg at 02/04/23 0809   sodium chloride flush (NS) 0.9 % injection 3 mL, 3 mL, Intravenous, Q12H, Susanne Borders, PA, 3 mL at 02/04/23 0818   sodium chloride flush (NS) 0.9 % injection 3 mL, 3 mL, Intravenous, PRN, Susanne Borders, PA   sodium chloride tablet 1 g, 1 g, Oral, BID WC, Susanne Borders, PA, 1 g at 02/04/23 0810   umeclidinium bromide (INCRUSE ELLIPTA) 62.5 MCG/ACT 1 puff, 1 puff, Inhalation, Daily, Susanne Borders, [place], 1 puff at 02/04/23 3244   Vitamin  D (Ergocalciferol) (DRISDOL) 1.25 MG (50000 UNIT) capsule 50,000 Units, 50,000 Units, Oral, Q7 days, Susanne Borders, [place], 50,000 Units at  02/01/23 6440   zolpidem (AMBIEN) tablet 5 mg, 5 mg, Oral, QHS PRN, Susanne Borders, PA, 5 mg at 02/02/23 2207  Physical exam:  Vitals:   02/03/23 2033 02/04/23 0500 02/04/23 0541 02/04/23 0840  BP:   (!) 159/96 (!) 141/96  Pulse:   78 88  Resp:   19 18  Temp:   98.3 F (36.8 C) 98.1 F (36.7 C)  TempSrc:   Oral Oral  SpO2: 92%  95% 95%  Weight:  193 lb 5.5 oz (87.7 kg)    Height:       Physical Exam Cardiovascular:     Rate and Rhythm: Normal rate and regular rhythm.     Heart sounds: Normal heart sounds.  Pulmonary:     Effort: Pulmonary effort is normal.     Breath sounds: Normal breath sounds.  Abdominal:     General: Bowel sounds are normal.     Palpations: Abdomen is soft.  Skin:    General: Skin is warm and dry.  Neurological:     Mental Status: She is alert and oriented to person, place, and time.         Latest Ref Rng & Units 02/04/2023    5:27 AM  CMP  Glucose 70 - 99 mg/dL 91   BUN 6 - 20 mg/dL 24   Creatinine 3.47 - 1.00 mg/dL 4.25   Sodium 956 - 387 mmol/L 132   Potassium 3.5 - 5.1 mmol/L 4.8   Chloride 98 - 111 mmol/L 96   CO2 22 - 32 mmol/L 29   Calcium 8.9 - 10.3 mg/dL 56.4       Latest Ref Rng & Units 02/04/2023    5:27 AM  CBC  WBC 4.0 - 10.5 K/uL 11.9   Hemoglobin 12.0 - 15.0 g/dL 8.9   Hematocrit 33.2 - 46.0 % 26.5   Platelets 150 - 400 K/uL 397     @IMAGES @  CT HEAD WO CONTRAST ( )  Result Date: 02/03/2023 CLINICAL DATA:  Mental status change, unknown cause. Acute hypoxic respiratory failure EXAM: CT HEAD WITHOUT CONTRAST TECHNIQUE: Contiguous axial images were obtained from the base of the skull through the vertex without intravenous contrast. RADIATION DOSE REDUCTION: This exam was performed according to the departmental dose-optimization program which includes automated exposure control, adjustment of the mA and/or kV according to patient size and/or use of iterative reconstruction technique. COMPARISON:  Bone survey 10/09/2022.  FINDINGS: Brain: No acute intracranial abnormality. Specifically, no hemorrhage, hydrocephalus, mass lesion, acute infarction, or significant intracranial injury. Vascular: No hyperdense vessel or unexpected calcification. Skull: Focal lucent lesions throughout the skull. 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 These lytic lesions were not in the skull on the skull x-ray from 10/09/2022. Appearance is concerning for possible multiple myeloma. No fracture. Sinuses/Orbits: No acute findings Other: None IMPRESSION: No acute intracranial abnormality. Numerous scattered focal lucent lesions throughout the skull. These are new since prior bone survey from 10/09/2022 and concerning for multiple myeloma. Electronically Signed   By: Charlett Nose M.D.   On: 02/03/2023 16:30   DG Chest Port 1 View  Result Date: 02/01/2023 CLINICAL DATA:  Hypoxia EXAM: PORTABLE CHEST 1 VIEW COMPARISON:  01/30/2023 FINDINGS: Bilateral diffuse interstitial thickening and patchy alveolar airspace opacities at the right lung base and left mid lower lung. No pleural effusion or  pneumothorax. Stable cardiomediastinal silhouette. No acute osseous abnormality. Posterior spinal fusion of the upper and midthoracic spine. IMPRESSION: 1. Bilateral diffuse interstitial thickening and patchy alveolar airspace opacities at the right lung base and left mid lower lung concerning for pulmonary edema. Electronically Signed   By: Elige Ko M.D.   On: 02/01/2023 10:13   ECHOCARDIOGRAM COMPLETE  Result Date: 01/31/2023    ECHOCARDIOGRAM REPORT   Patient Name:   KIMIYA MATOTT Date of Exam: 01/31/2023 Medical Rec #:  [id number]      Height:       62.0 in Accession #:    [id number]     Weight:       170.0 lb Date of Birth:  07/01/1965      BSA:          1.784 m Patient Age:    57 years       BP:           106/57 mmHg Patient Gender: F              HR:           110 bpm. Exam Location:  [facility] Procedure: 2D Echo, Cardiac Doppler  and Color Doppler Indications:     Dyspnea  History:         Patient has no prior history of Echocardiogram examinations.                  Signs/Symptoms:Dyspnea, Chest Pain, Fatigue and Shortness of                  Breath; Risk Factors:Hypertension, Sleep Apnea and Current                  Smoker.  Sonographer:     Mikki Harbor Referring Phys:  [id number] Alford Highland Diagnosing Phys: Julien Nordmann MD  Sonographer Comments: Image acquisition challenging due to respiratory motion. IMPRESSIONS  1. Left ventricular ejection fraction, by estimation, is 60 to 65%. The left ventricle has normal function. The left ventricle has no regional wall motion abnormalities. There is mild left ventricular hypertrophy. Left ventricular diastolic parameters are consistent with Grade I diastolic dysfunction (impaired relaxation).  2. Right ventricular systolic function is normal. The right ventricular size is normal. There is normal pulmonary artery systolic pressure. The estimated right ventricular systolic pressure is 30.9 mmHg.  3. Left atrial size was mildly dilated.  4. The mitral valve is normal in structure. No evidence of mitral valve regurgitation. No evidence of mitral stenosis.  5. The aortic valve is tricuspid. Aortic valve regurgitation is not visualized. No aortic stenosis is present.  6. The inferior vena cava is normal in size with greater than 50% respiratory variability, suggesting right atrial pressure of 3 mmHg. FINDINGS  Left Ventricle: Left ventricular ejection fraction, by estimation, is 60 to 65%. The left ventricle has normal function. The left ventricle has no regional wall motion abnormalities. The left ventricular internal cavity size was normal in size. There is  mild left ventricular hypertrophy. Left ventricular diastolic parameters are consistent with Grade I diastolic dysfunction (impaired relaxation). Right Ventricle: The right ventricular size is normal. No increase in right ventricular wall  thickness. Right ventricular systolic function is normal. There is normal pulmonary artery systolic pressure. The tricuspid regurgitant velocity is 2.64 m/s, and  with an assumed right atrial pressure of 3 mmHg, the estimated right ventricular systolic pressure is 30.9 mmHg. Left Atrium: Left atrial size was mildly dilated. Right Atrium:  Right atrial size was normal in size. Pericardium: There is no evidence of pericardial effusion. Mitral Valve: The mitral valve is normal in structure. No evidence of mitral valve regurgitation. No evidence of mitral valve stenosis. MV peak gradient, 8.0 mmHg. The mean mitral valve gradient is 4.0 mmHg. Tricuspid Valve: The tricuspid valve is normal in structure. Tricuspid valve regurgitation is mild . No evidence of tricuspid stenosis. Aortic Valve: The aortic valve is tricuspid. Aortic valve regurgitation is not visualized. No aortic stenosis is present. Aortic valve mean gradient measures 9.0 mmHg. Aortic valve peak gradient measures 18.4 mmHg. Aortic valve area, by VTI measures 3.04  cm. Pulmonic Valve: The pulmonic valve was normal in structure. Pulmonic valve regurgitation is not visualized. No evidence of pulmonic stenosis. Aorta: The aortic root is normal in size and structure. Venous: The inferior vena cava is normal in size with greater than 50% respiratory variability, suggesting right atrial pressure of 3 mmHg. IAS/Shunts: No atrial level shunt detected by color flow Doppler.  LEFT VENTRICLE PLAX 2D LVIDd:         5.80 cm   Diastology LVIDs:         3.60 cm   LV e' medial:    9.36 cm/s LV PW:         0.90 cm   LV E/e' medial:  9.7 LV IVS:        1.00 cm   LV e' lateral:   10.00 cm/s LVOT diam:     2.20 cm   LV E/e' lateral: 9.1 LV SV:         99 LV SV Index:   55 LVOT Area:     3.80 cm  RIGHT VENTRICLE RV Basal diam:  3.45 cm RV Mid diam:    3.00 cm RV S prime:     27.40 cm/s TAPSE (M-mode): 3.3 cm LEFT ATRIUM             Index        RIGHT ATRIUM           Index LA  diam:        4.80 cm 2.69 cm/m   RA Area:     18.90 cm LA Vol (A2C):   59.6 ml 33.41 ml/m  RA Volume:   54.10 ml  30.32 ml/m LA Vol (A4C):   66.9 ml 37.50 ml/m LA Biplane Vol: 65.0 ml 36.43 ml/m  AORTIC VALVE AV Area (Vmax):    3.09 cm AV Area (Vmean):   3.04 cm AV Area (VTI):     3.04 cm AV Vmax:           214.50 cm/s AV Vmean:          136.750 cm/s AV VTI:            0.326 m AV Peak Grad:      18.4 mmHg AV Mean Grad:      9.0 mmHg LVOT Vmax:         174.50 cm/s LVOT Vmean:        109.500 cm/s LVOT VTI:          0.260 m LVOT/AV VTI ratio: 0.80  AORTA Ao Root diam: 3.20 cm MITRAL VALVE                TRICUSPID VALVE MV Area (PHT): 3.72 cm     TR Peak grad:   27.9 mmHg MV Area VTI:   2.90 cm     TR Vmax:  264.00 cm/s MV Peak grad:  8.0 mmHg MV Mean grad:  4.0 mmHg     SHUNTS MV Vmax:       1.41 m/s     Systemic VTI:  0.26 m MV Vmean:      91.6 cm/s    Systemic Diam: 2.20 cm MV Decel Time: 204 msec MV E velocity: 90.70 cm/s MV A velocity: 117.00 cm/s MV E/A ratio:  0.78 Julien Nordmann MD Electronically signed by Julien Nordmann MD Signature Date/Time: 01/31/2023/5:43:31 PM    Final    DG Thoracic Spine 2 View  Result Date: 01/31/2023 CLINICAL DATA:  Back pain. 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 EXAM: THORACIC SPINE 2 VIEWS COMPARISON:  MRI of the cervicothoracic spine 01/24/2023. Intraoperative imaging of the thoracic spine 01/28/2023. Chest CTA 01/24/2023. 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 Counting superiorly from the T12 level, the patient has undergone midthoracic spinal decompression with posterior pedicle screw and rod fusion from T4 through T9. The hardware appears intact and well positioned. Chronic superior endplate compression deformity at T11 is unchanged. No acute osseous complications are identified. There is mild atelectasis at both lung bases. IMPRESSION: No demonstrated acute findings  status post recent midthoracic spinal decompression and fusion. Electronically Signed   By: Carey Bullocks M.D.   On: 01/31/2023 16:27   DG Chest Port 1 View  Result Date: 01/30/2023 CLINICAL DATA:  Cough, shortness of breath EXAM: PORTABLE CHEST 1 VIEW COMPARISON:  01/24/2023 FINDINGS: Cardiomegaly. Diffuse bilateral interstitial pulmonary opacity and trace pleural effusions. Interval thoracic fusion. IMPRESSION: Cardiomegaly with diffuse bilateral interstitial pulmonary opacity and trace pleural effusions, most consistent with edema. Electronically Signed   By: Jearld Lesch M.D.   On: 01/30/2023 14:07   DG Thoracic Spine 2 View  Result Date: 01/28/2023 CLINICAL DATA:  Elective surgery. T4 through T9 fusion of biopsy for lesion with intra 3D imaging. EXAM: THORACIC SPINE 2 VIEWS COMPARISON:  Preoperative imaging. FINDINGS: Intraoperative 3D imaging of the thoracic spine obtained. Posterior rod with pedicle screws at multiple levels. Known paravertebral mass better assessed on preoperative imaging. No fluoroscopic spot views provided. Fluoroscopy time 1 minutes 30 seconds. Dose is 97.045 mGy IMPRESSION: Intraoperative 3D imaging of the thoracic spine. Electronically Signed   By: Narda Rutherford M.D.   On: 01/28/2023 16:28   DG C-Arm 1-60 Min-No Report  Result Date: 01/28/2023 Fluoroscopy was utilized by the requesting physician.  No radiographic interpretation.   DG C-Arm 1-60 Min-No Report  Result Date: 01/28/2023 Fluoroscopy was utilized by the requesting physician.  No radiographic interpretation.   DG C-Arm 1-60 Min-No Report  Result Date: 01/28/2023 Fluoroscopy was utilized by the requesting physician.  No radiographic interpretation.   DG C-Arm 1-60 Min-No Report  Result Date: 01/28/2023 Fluoroscopy was utilized by the requesting physician.  No radiographic interpretation.   MR THORACIC SPINE W WO CONTRAST  Result Date: 01/24/2023 CLINICAL DATA:  Soft tissue mass with bony  destruction involving the left seventh rib and T6 and T7 vertebral bodies. EXAM: MRI CERVICAL AND THORACIC SPINE WITHOUT AND WITH CONTRAST TECHNIQUE: Multiplanar and multiecho pulse sequences of the cervical spine, to include the craniocervical junction and cervicothoracic junction, and the thoracic spine, were obtained without and with intravenous contrast. CONTRAST:  7mL GADAVIST GADOBUTROL 1 MMOL/ML IV SOLN COMPARISON:  Same day CTA chest, cervical and thoracic spine MRI 07/20/2020 FINDINGS: MRI CERVICAL SPINE FINDINGS Alignment: There is trace retrolisthesis of C3 on  C4 and trace anterolisthesis of C5 on C6. Vertebrae: Vertebral body heights are preserved. Background marrow signal is normal. A T1 hypointense/T2 hyperintense lesion in the clivus is unchanged since 2022, favored benign. There is no suspicious marrow signal abnormality or marrow edema in the cervical spine. There is no abnormal marrow enhancement in the cervical spine. There is a 6 mm enhancing lesion in the right occipital calvarium which was not seen on the prior cervical spine MRI from 2022; however, this area may not has been included within the field of view. Cord: Normal in signal and morphology. Posterior Fossa, vertebral arteries, paraspinal tissues: The imaged posterior fossa is unremarkable. The vertebral artery flow voids are normal. The paraspinal soft tissues are unremarkable. Disc levels: There is overall mild degenerative change in the cervical spine without significant spinal canal or neural foraminal stenosis. MRI THORACIC SPINE FINDINGS Alignment:  Normal. Vertebrae: Background marrow signal is normal. There is infiltrative T1 hypointensity occupying most of the T6 and T7 vertebral bodies extending into the left pedicles and facets. There is a bulky soft tissue mass in the adjacent left paraspinal/subpleural soft tissues with destruction of the seventh rib. The bulk of the soft tissue component measures up to proximally 7.2 cm x  3.4 cm x 2.7 cm. 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 There is mild loss of vertebral body height at both levels consistent with associated pathologic fractures. There is compression deformity of the T11 vertebral body with up to approximately 30% loss of vertebral body height anteriorly and minimal bony retropulsion. There is faint edema along the superior endplate consistent with acute to subacute chronicity. This fracture may be pathologic. There are probable additional lesions involving multiple additional ribs (for example 25-21, 25-24). The above findings are new since the MRI from 2022. Cord:  There is no cord signal abnormality or abnormal enhancement. Paraspinal and other soft tissues: Unremarkable, aside from the bulky paraspinal tumor described above. Disc levels: There is a small disc protrusion at T10-T11 without significant spinal canal or neural foraminal stenosis. Otherwise, there is overall minimal background degenerative change without significant spinal canal or neural foraminal stenosis. IMPRESSION: 1. Large soft tissue mass in the left paraspinal soft tissues at T6-T7 with destruction of the seventh rib and invasion of the T6 and T7 vertebral bodies with involvement of the left posterior elements as well as epidural tumor in the spinal canal resulting in moderate spinal canal stenosis without frank cord compression or cord edema. 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 2. Acute to subacute compression fracture of the T11 vertebral body with up to approximately 30% loss of vertebral body height and minimal bony retropulsion, possibly pathologic. 3. Small enhancing lesion in the right occipital  calvarium, not definitely present on the prior cervical spine from 2022 (though possibly not included within the field of view on that study), suspicious for an additional metastatic or myeloma lesion. 4. Additional probable lesions involving multiple additional ribs. 5. No acute or suspicious finding in the cervical spine. Electronically Signed   By: Lesia Hausen M.D.   On: 01/24/2023 14:33   MR Cervical Spine W and Wo Contrast  Result Date: 01/24/2023 CLINICAL DATA:  Soft tissue mass with bony destruction involving the left seventh  rib and T6 and T7 vertebral bodies. EXAM: MRI CERVICAL AND THORACIC SPINE WITHOUT AND WITH CONTRAST TECHNIQUE: Multiplanar and multiecho pulse sequences of the cervical spine, to include the craniocervical junction and cervicothoracic junction, and the thoracic spine, were obtained without and with intravenous contrast. CONTRAST:  7mL GADAVIST GADOBUTROL 1 MMOL/ML IV SOLN COMPARISON:  Same day CTA chest, cervical and thoracic spine MRI 07/20/2020 FINDINGS: MRI CERVICAL SPINE FINDINGS Alignment: There is trace retrolisthesis of C3 on C4 and trace anterolisthesis of C5 on C6. Vertebrae: Vertebral body heights are preserved. Background marrow signal is normal. A T1 hypointense/T2 hyperintense lesion in the clivus is unchanged since 2022, favored benign. There is no suspicious marrow signal abnormality or marrow edema in the cervical spine. There is no abnormal marrow enhancement in the cervical spine. There is a 6 mm enhancing lesion in the right occipital calvarium which was not seen on the prior cervical spine MRI from 2022; however, this area may not has been included within the field of view. Cord: Normal in signal and morphology. Posterior Fossa, vertebral arteries, paraspinal tissues: The imaged posterior fossa is unremarkable. The vertebral artery flow voids are normal. The paraspinal soft tissues are unremarkable. Disc levels: There is overall mild degenerative change in the  cervical spine without significant spinal canal or neural foraminal stenosis. MRI THORACIC SPINE FINDINGS Alignment:  Normal. Vertebrae: Background marrow signal is normal. There is infiltrative T1 hypointensity occupying most of the T6 and T7 vertebral bodies extending into the left pedicles and facets. There is a bulky soft tissue mass in the adjacent left paraspinal/subpleural soft tissues with destruction of the seventh rib. The bulk of the soft tissue component measures up to proximally 7.2 cm x 3.4 cm x 2.7 cm. 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 There is mild loss of vertebral body height at both levels consistent with associated pathologic fractures. There is compression deformity of the T11 vertebral body with up to approximately 30% loss of vertebral body height anteriorly and minimal bony retropulsion. There is faint edema along the superior endplate consistent with acute to subacute chronicity. This fracture may be pathologic. There are probable additional lesions involving multiple additional ribs (for example 25-21, 25-24). The above findings are new since the MRI from 2022. Cord:  There is no cord signal abnormality or abnormal enhancement. Paraspinal and other soft tissues: Unremarkable, aside from the bulky paraspinal tumor described above. Disc levels: There is a small disc protrusion at T10-T11 without significant spinal canal or neural foraminal stenosis. Otherwise, there is overall minimal background degenerative change without significant spinal canal or neural foraminal stenosis. IMPRESSION: 1. Large soft tissue mass in the left paraspinal soft tissues at T6-T7 with destruction of the seventh rib and invasion of the T6 and T7 vertebral bodies with  involvement of the left posterior elements as well as epidural tumor in the spinal canal resulting in moderate spinal canal stenosis without frank cord compression or cord edema. 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 2. Acute to subacute compression fracture of the T11 vertebral body with up to approximately 30% loss of vertebral body height and minimal bony retropulsion, possibly pathologic. 3. Small enhancing lesion in the right occipital calvarium, not definitely present on the prior cervical spine  from 2022 (though possibly not included within the field of view on that study), suspicious for an additional metastatic or myeloma lesion. 4. Additional probable lesions involving multiple additional ribs. 5. No acute or suspicious finding in the cervical spine. Electronically Signed   By: Lesia Hausen M.D.   On: 01/24/2023 14:33   CT Angio Chest PE W and/or Wo Contrast  Result Date: 01/24/2023 CLINICAL DATA:  Chest pain and shortness of breath for a month. EXAM: CT ANGIOGRAPHY CHEST WITH CONTRAST TECHNIQUE: Multidetector CT imaging of the chest was performed using the standard protocol during bolus administration of intravenous contrast. Multiplanar CT image reconstructions and MIPs were obtained to evaluate the vascular anatomy. RADIATION DOSE REDUCTION: This exam was performed according to the departmental dose-optimization program which includes automated exposure control, adjustment of the mA and/or kV according to patient size and/or use of iterative reconstruction technique. CONTRAST:  75mL OMNIPAQUE IOHEXOL 350 MG/ML SOLN COMPARISON:  X-ray 01/24/2023 and older.  Previous CT January 2017 FINDINGS: Cardiovascular: The thoracic aorta has a normal course and caliber with minimal calcified plaque. There is a bovine type aortic arch, normal variant. Coronary artery calcifications are seen. Please correlate for other coronary risk factors. Heart is  nonenlarged. There is slight wall thickening of the left ventricle. Prominent fat along the intra-atrial septum. Pulmonary arteries show some slight enlargement centrally. Please correlate for any evidence of pulmonary artery hypertension. There is heterogeneous enhancement of the pulmonary arterial tree limiting evaluation for emboli. No obvious large and central embolus although several areas are nondiagnostic. Mediastinum/Nodes: Surgical changes identified with a moderate hiatal hernia. Small thyroid gland. No specific abnormal lymph node enlargement identified in the axillary region, hilum or mediastinum. There are some small nodes identified in the posterior mediastinum, posterior to the descending thoracic aorta such as series 4, image 86 but not pathologic by size criteria. Lungs/Pleura: There is some linear opacity lung bases likely scar or atelectasis. No consolidation. Breathing motion identified. No pleural effusion or pneumothorax. 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 Benign hepatic cysts as well. Musculoskeletal: There is large destructive mass involving the posterior aspect of the left seventh rib with extension to involve the central canal of the spine in the associated vertebral bodies of T6 and T7. Cord compression is possible. This mass is estimated in diameter on series 4, image 65 at 8.5 by 4.3 cm. Slight extension along the extrapleural space in the posterior mediastinum as well. There is question of some subtle other lucent lesions identified along the spine but indeterminate. Critical Value/emergent results were called by telephone at the time of interpretation on 01/24/2023 at 8:41 am to provider [facility] , who verbally acknowledged these results. Review of the MIP images confirms the above findings. IMPRESSION: Aggressive soft tissue mass with bone destruction involving the left seventh rib as well as the T6 and T7 vertebral  bodies and central canal. A neoplastic process is possible. There also is a possibility of significant cord compression with canal encroachment along the spine. Recommend further evaluation with spine MRI with and without contrast and a whole-body bone scan. There appears to be some other subtle lucent bone lesions along the spine and sternum. Please correlate for any history of known malignancy including such processes as myeloma or other. Poor opacification of the pulmonary arterial tree limiting evaluation. Several areas are non diagnostic for pulmonary emboli. No obvious large and central embolus. Aortic Atherosclerosis (ICD10-I70.0). Electronically Signed   By: Scarlette Shorts  Chales Abrahams M.D.   On: 01/24/2023 11:47   DG Chest 2 View  Result Date: 01/24/2023 CLINICAL DATA:  Chest pain EXAM: CHEST - 2 VIEW COMPARISON:  Chest x-ray dated January 07, 2023 FINDINGS: The heart size and mediastinal contours are within normal limits. Small hiatal hernia. Both lungs are clear. Pleural-based nodular opacity of the left posterior hemithorax. The visualized skeletal structures are unremarkable. IMPRESSION: Pleural-based nodular opacity of the left posterior hemithorax. Recommend further evaluation with contrast enhanced chest CT. Electronically Signed   By: Allegra Lai M.D.   On: 01/24/2023 09:38   DG Ribs Unilateral Left  Result Date: 01/09/2023 CLINICAL DATA:  Fall, left rib pain. EXAM: LEFT RIBS - 2 VIEW COMPARISON:  Chest radiograph 11/01/2022 FINDINGS: The cardiomediastinal silhouette is stable and within normal limits. Linear opacities in the left midlung likely reflect atelectasis or scar. There is no other focal airspace opacity. There is no pulmonary edema. There is no pleural effusion or pneumothorax There is no displaced rib fracture or other acute osseous abnormality. IMPRESSION: No displaced rib fracture or other acute osseous abnormality identified. Electronically Signed   By: Lesia Hausen M.D.   On: 01/09/2023  17:13   DG Lumbar Spine Complete  Result Date: 01/09/2023 CLINICAL DATA:  Trauma, fall, pain EXAM: LUMBAR SPINE - COMPLETE 4+ VIEW COMPARISON:  12/24/2018 FINDINGS: No recent fracture is seen. Alignment of posterior margins of vertebral bodies appears normal. Small bony spurs are noted. Facet hypertrophy is seen, more so at L5-S1 level. Arterial calcifications are seen in aorta. IMPRESSION: No recent fracture is seen. Degenerative changes are noted, more so in facet joints in lower lumbar spine. Aortic arteriosclerosis. Electronically Signed   By: Ernie Avena M.D.   On: 01/09/2023 17:12     Assessment and plan- Patient is a 57 y.o. 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  With the increasing free light chain ratio and myeloma panel this is diagnostic of multiple myeloma.  Patient will need a PET scan as an outpatient I am hoping to get a bone marrow biopsy tomorrow as an inpatient to complete her staging workup.  I will need to wait for about 2 to 3 weeks after her recent spine surgery to start treatment for her multiple myeloma.Hemoglobin is stable and renal functions have improved but hypercalcemia remains a concern.  Neoplasm related pain: Pain not adequately controlled with as needed oxycodone and 10 mg every 12 OxyContin.  I am adding dexamethasone 4 mg twice daily and increased OxyContin to 20 mg every 12 hours close monitoring of her mental status.    Altered mental status: She was oriented to self place and person at the time of my visit but there were reports of altered mental status.  This could be secondary to her hypercalcemia as well which has been increasing and presently her calcium is 13.8 as compared to 10.44 days ago.  She did receive a dose of pamidronate yesterday.  Okay to give her 1 dose of calcitonin today with  repeat BMP again in the evening to decide if she needs further doses   Visit Diagnosis 1. Spinal cord mass (HCC)   2. Palliative care encounter   3. Hypercalcemia   4. Neoplasm related pain      Dr. Owens Shark, MD, MPH [facility] [phone] 02/04/2023 12:42 PM

## 2023-02-04 NOTE — Plan of Care (Signed)

## 2023-02-04 NOTE — Progress Notes (Signed)
Occupational Therapy Treatment Patient Details Name: Paula Garrett MRN: [id number] DOB: 1965/09/16 Today's Date: 02/04/2023   History of present illness Pt is a 57 year old female presenting to the ED with chest pain and SOB. Imaging study showed large soft tissue mass in left paraspinal soft tissue at T6-T7 with destruction of seventh rib and invasion of T6 and T7 vertebral bodies involving left posterior elements as well as epidural tumor and spinal canal with moderate spinal canal stenosis without frank cord compression or cord edema, acute/subacute compression fracture of T11 vertebral body with up to 30% vertebral body height and minimal bony retropulsion, possibly pathologic. Right occipital calvarium with small enhancing lesion, suspicious for additional metastatic or myeloma lesion.Pt is now s/p Posterior Segmental Instrumentation T4 to T9, Posterolateral arthrodesis from T4 to T9 ,Thoracic Laminotomy at T6 and T7 01/28/23.        PMH significant for HTN, depression, anxiety, history of tobacco use, GERD, known multiple myeloma followed as outpatient   OT comments  Upon entering the room, pt sitting in bathroom with RN assisting and therapist offering assistance. Pt is very impulsive during session and needing increased cuing for safety awareness. Pt needing assistance to manage wound vac and O2 line. Min guard to stand from toilet and perform hygiene and adjust clothing. Pt needing cuing for sequence and problem solving using soap dispenser while washing hands standing at sink. Pt reports increased fatigue and " I need to go back to bed. I am so tired". OT assisting pt back to bed with min guard. Bed alarm activated and call bell within reach.       If plan is discharge home, recommend the following:  A little help with walking and/or transfers;A little help with bathing/dressing/bathroom;Assist for transportation;Help with stairs or ramp for entrance;Assistance with  cooking/housework;Supervision due to cognitive status   Equipment Recommendations  Tub/shower bench       Precautions / Restrictions Precautions Precautions: Fall;Back Precaution Comments: back wound vac, new O2 Required Braces or Orthoses: Spinal Brace Spinal Brace: Thoracolumbosacral orthotic       Mobility Bed Mobility Overal bed mobility: Modified Independent                  Transfers Overall transfer level: Needs assistance Equipment used: None Transfers: Sit to/from Stand Sit to Stand: Supervision, Contact guard assist                 Balance Overall balance assessment: Needs assistance Sitting-balance support: Feet supported Sitting balance-Leahy Scale: Good     Standing balance support: During functional activity, No upper extremity supported Standing balance-Leahy Scale: Fair                             ADL either performed or assessed with clinical judgement   ADL Overall ADL's : Needs assistance/impaired     Grooming: Contact guard assist;Supervision/safety;Standing;Wash/dry hands                   Toilet Transfer: Electrical engineer and Hygiene: Contact guard assist;Sit to/from stand              Extremity/Trunk Assessment Upper Extremity Assessment Upper Extremity Assessment: Overall WFL for tasks assessed   Lower Extremity Assessment Lower Extremity Assessment: Overall WFL for tasks assessed        Vision Patient Visual Report: No change from baseline  Cognition Arousal: Lethargic, Alert Behavior During Therapy: WFL for tasks assessed/performed Overall Cognitive Status: Impaired/Different from baseline Area of Impairment: Safety/judgement                         Safety/Judgement: Decreased awareness of safety                         Pertinent Vitals/ Pain       Pain Assessment Pain Assessment: Faces Faces Pain  Scale: Hurts little more Pain Location: back Pain Descriptors / Indicators: Discomfort Pain Intervention(s): Limited activity within patient's tolerance, Monitored during session, Repositioned         Frequency  Min 1X/week        Progress Toward Goals  OT Goals(current goals can now be found in the care plan section)  Progress towards OT goals: Progressing toward goals            AM-PAC OT "6 Clicks" Daily Activity     Outcome Measure   Help from another person eating meals?: None Help from another person taking care of personal grooming?: A Little Help from another person toileting, which includes using toliet, bedpan, or urinal?: A Little Help from another person bathing (including washing, rinsing, drying)?: A Little Help from another person to put on and taking off regular upper body clothing?: None Help from another person to put on and taking off regular lower body clothing?: A Little 6 Click Score: 20    End of Session Equipment Utilized During Treatment: Rolling walker (2 wheels);Oxygen  OT Visit Diagnosis: Other abnormalities of gait and mobility (R26.89)   Activity Tolerance Patient limited by fatigue   Patient Left in bed;with call bell/phone within reach;with bed alarm set   Nurse Communication Mobility status        Time: 8295-6213 OT Time Calculation (min): 12 min  Charges: OT General Charges $OT Visit: 1 Visit OT Treatments $Self Care/Home Management : 8-22 mins  Jackquline Denmark, MS, OTR/L , CBIS ascom [phone]  02/04/23, 9:42 AM

## 2023-02-04 NOTE — Consult Note (Signed)
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 female with PMH significant for anxiety, arthritis, asthma, depression, GERD, hypertension, hypothyroidism being seen today in relation to newly diagnosed plasmacytoma. Patient initially presented to [facility] ED on 01/24/23 for chest pain and shortness of breath x1 month. Imaging performed at that time revealed an aggressive soft tissue mass with bony destruction of the left seventh rib and T6/T7 vertebral bodies. Patient underwent posterior thoracic fusion of T4-T9 with biopsy on 01/28/23 which revealed a plasmacytoma. IR has been consulted to evaluate patient for bone marrow biopsy to further evaluate the staging of her plasmacytoma.   Past Medical History:  Diagnosis Date   Anxiety    Arthritis    Asthma    Barrett's esophagus 2015   COVID-19    03/10/20   Depression    GERD (gastroesophageal reflux disease)    Hypertension    Hypothyroidism    Pneumonia    Smoldering myeloma    Thyroid disease     Past Surgical History:  Procedure Laterality Date   ABLATION  2006   APPLICATION OF INTRAOPERATIVE CT SCAN N/A 01/28/2023   Procedure: APPLICATION OF INTRAOPERATIVE CT SCAN;  Surgeon: Loreen Freud, MD;  Location: [facility] ORS;  Service: Neurosurgery;  Laterality: N/A;   BREAST CYST ASPIRATION Left 1998   BREAST SURGERY     cyst aspiration   BREAST SURGERY  1998   cyst aspiration   COLONOSCOPY  03/2014   COLONOSCOPY WITH PROPOFOL N/A 01/06/2020   Procedure: COLONOSCOPY WITH PROPOFOL;  Surgeon: Toledo, Boykin Nearing, MD;  Location: [facility] ENDOSCOPY;  Service: Gastroenterology;  Laterality: N/A;   ENDOMETRIAL ABLATION     ESOPHAGOGASTRODUODENOSCOPY (EGD) WITH PROPOFOL N/A 08/15/2015   Procedure: ESOPHAGOGASTRODUODENOSCOPY (EGD) WITH PROPOFOL;   Surgeon: Christena Deem, MD;  Location[facility]COPY;  Service: Endoscopy;  Laterality: N/A;   ESOPHAGOGASTRODUODENOSCOPY (EGD) WITH PROPOFOL N/A 01/22/2016   Procedure: ESOPHAGOGASTRODUODENOSCOPY (EGD) WITH PROPOFOL;  Surgeon: Christena Deem, MD;  Location: [facility] ENDOSCOPY;  Service: Endoscopy;  Laterality: N/A;   ESOPHAGOGASTRODUODENOSCOPY (EGD) WITH PROPOFOL N/A 01/06/2020   Procedure: ESOPHAGOGASTRODUODENOSCOPY (EGD) WITH PROPOFOL;  Surgeon: Toledo, Boykin Nearing, MD;  Location: [facility] ENDOSCOPY;  Service: Gastroenterology;  Laterality: N/A;   GASTRIC BYPASS  2005   HERNIA REPAIR     NASAL SINUS SURGERY     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   TOTAL THYROIDECTOMY     TUBAL LIGATION      Allergies: Hctz [hydrochlorothiazide]  Medications: Prior to Admission medications   Medication Sig Start Date End Date Taking? Authorizing Provider  amLODipine (NORVASC) 10 MG tablet Take 1 tablet (10 mg total) by mouth daily. 11/01/22  Yes Kara Dies, NP  Budeson-Glycopyrrol-Formoterol (BREZTRI AEROSPHERE) 160-9-4.8 MCG/ACT AERO Inhale 2 puffs into the lungs in the morning and at bedtime. 12/13/22  Yes Dgayli, Lianne Bushy, MD  cetirizine (ZYRTEC) 10 MG tablet Take 1 tablet (10 mg total) by mouth daily. 03/20/22  Yes McLean-Scocuzza, Pasty Spillers, MD  clonazePAM (KLONOPIN) 1 MG tablet Take 1 tablet (1 mg total) by mouth 2 (two) times daily. 11/20/22  Yes Karamalegos, Netta Neat, DO  cyanocobalamin (VITAMIN B12) 1000 MCG/ML injection INJECT 1 ML IN THE MUSCLE EVERY 30 DAYS 03/15/22  Yes McLean-Scocuzza, Pasty Spillers, MD  cyclobenzaprine (FLEXERIL) 10 MG tablet Take 0.5-1 tablets (5-10  mg total) by mouth 3 (three) times daily as needed for muscle spasms. 01/09/23  Yes Karamalegos, Netta Neat, DO  escitalopram (LEXAPRO) 10 MG tablet Take 1 tablet (10 mg total) by mouth daily. 11/20/22  Yes Karamalegos, Netta Neat, DO  levothyroxine (SYNTHROID) 125 MCG tablet Take 250 mcg by mouth daily before breakfast. 11/20/22  Yes  [provider]  pantoprazole (PROTONIX) 40 MG tablet 30 min before lunch or dinner 03/20/22  Yes McLean-Scocuzza, Pasty Spillers, MD  telmisartan (MICARDIS) 80 MG tablet 1 whole pill qd goal BP <130/<80. 03/20/22  Yes McLean-Scocuzza, Pasty Spillers, MD  Vitamin D, Ergocalciferol, (DRISDOL) 1.25 MG (50000 UNIT) CAPS capsule Take 50,000 Units by mouth every 7 (seven) days.   Yes [provider]  zolpidem (AMBIEN) 10 MG tablet Take 0.5-1 tablets (5-10 mg total) by mouth at bedtime as needed for sleep. 11/20/22  Yes Karamalegos, Netta Neat, DO  albuterol (VENTOLIN HFA) 108 (90 Base) MCG/ACT inhaler Inhale 2 puffs into the lungs every 6 (six) hours as needed for wheezing or shortness of breath. Patient not taking: Reported on 01/24/2023 03/20/22   McLean-Scocuzza, Pasty Spillers, MD  montelukast (SINGULAIR) 10 MG tablet Take 1 tablet (10 mg total) by mouth at bedtime. Patient not taking: Reported on 01/24/2023 03/20/22   McLean-Scocuzza, Pasty Spillers, MD  nicotine polacrilex (NICOTINE MINI) 2 MG lozenge Take 1 lozenge (2 mg total) by mouth every 2 (two) hours as needed for smoking cessation. Patient not taking: Reported on 01/24/2023 12/13/22 03/13/23  Raechel Chute, MD  ondansetron (ZOFRAN) 8 MG tablet Take 1 tablet (8 mg total) by mouth every 8 (eight) hours as needed for nausea or vomiting. Patient not taking: Reported on 01/24/2023 06/12/21   Mauro Kaufmann, NP  predniSONE (DELTASONE) 20 MG tablet Take daily with food. Start with 60mg  (3 pills) x 2 days, then reduce to 40mg  (2 pills) x 2 days, then 20mg  (1 pill) x 3 days Patient not taking: Reported on 01/24/2023 01/09/23   Smitty Cords, DO  Spacer/Aero-Holding Chambers (AEROCHAMBER MV) inhaler Use as instructed 12/13/22   Raechel Chute, MD  SYRINGE-NEEDLE, DISP, 3 ML 25G X 1-1/2" 3 ML MISC 1 Device by Does not apply route every 30 (thirty) days. With B12 shot 10/25/21   McLean-Scocuzza, Pasty Spillers, MD     Family History  Problem Relation Age of Onset    Heart disease Mother    COPD Mother    Arthritis Mother    Cancer Mother        uterine   Lupus Mother    Anxiety disorder Mother    Depression Mother    Drug abuse Mother    COPD Father    Arthritis Father    Heart disease Father    Heart attack Father    Alcohol abuse Father    Heart disease Brother    Heart attack Brother    Drug abuse Brother    Anxiety disorder Brother    Depression Brother    Heart disease Brother    Cancer Brother        liver cancer age 85 y.o   Diabetes Maternal Grandmother    Diabetes Maternal Grandfather    Diabetes Paternal Grandmother    Diabetes Paternal Grandfather    Breast cancer Neg Hx     Social History   Socioeconomic History   Marital status: Divorced    Spouse name: Not on file   Number of children: 2   Years of education: Not on  file   Highest education level: GED or equivalent  Occupational History   Occupation: Part-time  Tobacco Use   Smoking status: Every Day    Current packs/day: 1.00    Average packs/day: 1 pack/day for 42.9 years (42.9 ttl pk-yrs)    Types: Cigarettes    Start date: 03/15/1980   Smokeless tobacco: Never  Vaping Use   Vaping status: Never Used  Substance and Sexual Activity   Alcohol use: Yes    Alcohol/week: 10.0 - 12.0 standard drinks of alcohol    Types: 8 - 10 Glasses of wine, 2 Standard drinks or equivalent per week   Drug use: No   Sexual activity: Yes    Partners: Male  Other Topics Concern   Not on file  Social History Narrative   Lives in [place] single, divorced       Work - 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       Diet - healthy diet   Exercise - limited   Caffeine use: daily   Social Determinants of Health   Financial Resource Strain: Low Risk  (11/01/2022)   Overall Financial Resource Strain (CARDIA)    Difficulty of Paying Living Expenses: Not very hard  Food Insecurity: No Food Insecurity (01/24/2023)   Hunger Vital Sign    Worried About Running Out of Food in the  Last Year: Never true    Ran Out of Food in the Last Year: Never true  Transportation Needs: No Transportation Needs (01/24/2023)   PRAPARE - Administrator, Civil Service (Medical): No    Lack of Transportation (Non-Medical): No  Physical Activity: Insufficiently Active (11/01/2022)   Exercise Vital Sign    Days of Exercise per Week: 3 days    Minutes of Exercise per Session: 20 min  Stress: Stress Concern Present (11/01/2022)   [employer] of Occupational Health - Occupational Stress Questionnaire    Feeling of Stress : Very much  Social Connections: Socially Isolated (11/01/2022)   Social Connection and Isolation Panel [NHANES]    Frequency of Communication with Friends and Family: More than three times a week    Frequency of Social Gatherings with Friends and Family: Once a week    Attends Religious Services: Never    Database administrator or Organizations: No    Attends Engineer, structural: Not on file    Marital Status: Divorced    Code Status: Full code  Review of Systems: A 12 point ROS discussed and pertinent positives are indicated in the HPI above.  All other systems are negative.  Review of Systems  Constitutional:  Negative for chills and fever.  Respiratory:  Positive for shortness of breath. Negative for chest tightness.   Cardiovascular:  Positive for chest pain. Negative for leg swelling.       Patient endorses left sided chest pain for over 1 month  Gastrointestinal:  Negative for abdominal pain, diarrhea, nausea and vomiting.  Neurological:  Negative for dizziness and headaches.  Psychiatric/Behavioral:  Negative for confusion.     Vital Signs: BP (!) 141/96 (BP Location: Left Arm)   Pulse 88   Temp 98.1 F (36.7 C) (Oral)   Resp 18   Ht 5\' 2"  (1.575 m)   Wt 193 lb 5.5 oz (87.7 kg)   SpO2 95%   BMI 35.36 kg/m     Physical Exam Vitals reviewed.  Constitutional:      General: She is not in acute distress.  Appearance:  She is ill-appearing.  HENT:     Mouth/Throat:     Mouth: Mucous membranes are moist.  Cardiovascular:     Rate and Rhythm: Normal rate and regular rhythm.     Pulses: Normal pulses.     Heart sounds: Normal heart sounds.  Pulmonary:     Effort: Pulmonary effort is normal.     Breath sounds: Normal breath sounds.  Abdominal:     Palpations: Abdomen is soft.     Tenderness: There is no abdominal tenderness.  Musculoskeletal:     Right lower leg: No edema.     Left lower leg: No edema.  Skin:    General: Skin is warm and dry.  Neurological:     Mental Status: She is alert and oriented to person, place, and time.  Psychiatric:        Mood and Affect: Mood normal.        Behavior: Behavior normal.        Thought Content: Thought content normal.        Judgment: Judgment normal.     Imaging: CT HEAD WO CONTRAST ( )  Result Date: 02/03/2023 CLINICAL DATA:  Mental status change, unknown cause. Acute hypoxic respiratory failure EXAM: CT HEAD WITHOUT CONTRAST TECHNIQUE: Contiguous axial images were obtained from the base of the skull through the vertex without intravenous contrast. RADIATION DOSE REDUCTION: This exam was performed according to the departmental dose-optimization program which includes automated exposure control, adjustment of the mA and/or kV according to patient size and/or use of iterative reconstruction technique. COMPARISON:  Bone survey 10/09/2022. FINDINGS: Brain: No acute intracranial abnormality. Specifically, no hemorrhage, hydrocephalus, mass lesion, acute infarction, or significant intracranial injury. Vascular: No hyperdense vessel or unexpected calcification. Skull: Focal lucent lesions throughout the skull. 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 These lytic lesions were not in the skull on the skull x-ray from 10/09/2022. Appearance is concerning for possible multiple myeloma. No fracture. Sinuses/Orbits: No acute findings Other:  None IMPRESSION: No acute intracranial abnormality. Numerous scattered focal lucent lesions throughout the skull. These are new since prior bone survey from 10/09/2022 and concerning for multiple myeloma. Electronically Signed   By: Charlett Nose M.D.   On: 02/03/2023 16:30   DG Chest Port 1 View  Result Date: 02/01/2023 CLINICAL DATA:  Hypoxia EXAM: PORTABLE CHEST 1 VIEW COMPARISON:  01/30/2023 FINDINGS: Bilateral diffuse interstitial thickening and patchy alveolar airspace opacities at the right lung base and left mid lower lung. No pleural effusion or pneumothorax. Stable cardiomediastinal silhouette. No acute osseous abnormality. Posterior spinal fusion of the upper and midthoracic spine. IMPRESSION: 1. Bilateral diffuse interstitial thickening and patchy alveolar airspace opacities at the right lung base and left mid lower lung concerning for pulmonary edema. Electronically Signed   By: Elige Ko M.D.   On: 02/01/2023 10:13   ECHOCARDIOGRAM COMPLETE  Result Date: 01/31/2023    ECHOCARDIOGRAM REPORT   Patient Name:   ARRIANA ZAMUDIO Date of Exam: 01/31/2023 Medical Rec #:  [id number]      Height:       62.0 in Accession #:    [id number]     Weight:       170.0 lb Date of Birth:  1965-11-21      BSA:          1.784 m Patient Age:    57 years       BP:  106/57 mmHg Patient Gender: F              HR:           110 bpm. Exam Location:  [facility] Procedure: 2D Echo, Cardiac Doppler and Color Doppler Indications:     Dyspnea  History:         Patient has no prior history of Echocardiogram examinations.                  Signs/Symptoms:Dyspnea, Chest Pain, Fatigue and Shortness of                  Breath; Risk Factors:Hypertension, Sleep Apnea and Current                  Smoker.  Sonographer:     Mikki Harbor Referring Phys:  [id number] Alford Highland Diagnosing Phys: Julien Nordmann MD  Sonographer Comments: Image acquisition challenging due to respiratory motion. IMPRESSIONS  1. Left ventricular ejection  fraction, by estimation, is 60 to 65%. The left ventricle has normal function. The left ventricle has no regional wall motion abnormalities. There is mild left ventricular hypertrophy. Left ventricular diastolic parameters are consistent with Grade I diastolic dysfunction (impaired relaxation).  2. Right ventricular systolic function is normal. The right ventricular size is normal. There is normal pulmonary artery systolic pressure. The estimated right ventricular systolic pressure is 30.9 mmHg.  3. Left atrial size was mildly dilated.  4. The mitral valve is normal in structure. No evidence of mitral valve regurgitation. No evidence of mitral stenosis.  5. The aortic valve is tricuspid. Aortic valve regurgitation is not visualized. No aortic stenosis is present.  6. The inferior vena cava is normal in size with greater than 50% respiratory variability, suggesting right atrial pressure of 3 mmHg. FINDINGS  Left Ventricle: Left ventricular ejection fraction, by estimation, is 60 to 65%. The left ventricle has normal function. The left ventricle has no regional wall motion abnormalities. The left ventricular internal cavity size was normal in size. There is  mild left ventricular hypertrophy. Left ventricular diastolic parameters are consistent with Grade I diastolic dysfunction (impaired relaxation). Right Ventricle: The right ventricular size is normal. No increase in right ventricular wall thickness. Right ventricular systolic function is normal. There is normal pulmonary artery systolic pressure. The tricuspid regurgitant velocity is 2.64 m/s, and  with an assumed right atrial pressure of 3 mmHg, the estimated right ventricular systolic pressure is 30.9 mmHg. Left Atrium: Left atrial size was mildly dilated. Right Atrium: Right atrial size was normal in size. Pericardium: There is no evidence of pericardial effusion. Mitral Valve: The mitral valve is normal in structure. No evidence of mitral valve regurgitation.  No evidence of mitral valve stenosis. MV peak gradient, 8.0 mmHg. The mean mitral valve gradient is 4.0 mmHg. Tricuspid Valve: The tricuspid valve is normal in structure. Tricuspid valve regurgitation is mild . No evidence of tricuspid stenosis. Aortic Valve: The aortic valve is tricuspid. Aortic valve regurgitation is not visualized. No aortic stenosis is present. Aortic valve mean gradient measures 9.0 mmHg. Aortic valve peak gradient measures 18.4 mmHg. Aortic valve area, by VTI measures 3.04  cm. Pulmonic Valve: The pulmonic valve was normal in structure. Pulmonic valve regurgitation is not visualized. No evidence of pulmonic stenosis. Aorta: The aortic root is normal in size and structure. Venous: The inferior vena cava is normal in size with greater than 50% respiratory variability, suggesting right atrial pressure of 3 mmHg. IAS/Shunts: No atrial level  shunt detected by color flow Doppler.  LEFT VENTRICLE PLAX 2D LVIDd:         5.80 cm   Diastology LVIDs:         3.60 cm   LV e' medial:    9.36 cm/s LV PW:         0.90 cm   LV E/e' medial:  9.7 LV IVS:        1.00 cm   LV e' lateral:   10.00 cm/s LVOT diam:     2.20 cm   LV E/e' lateral: 9.1 LV SV:         99 LV SV Index:   55 LVOT Area:     3.80 cm  RIGHT VENTRICLE RV Basal diam:  3.45 cm RV Mid diam:    3.00 cm RV S prime:     27.40 cm/s TAPSE (M-mode): 3.3 cm LEFT ATRIUM             Index        RIGHT ATRIUM           Index LA diam:        4.80 cm 2.69 cm/m   RA Area:     18.90 cm LA Vol (A2C):   59.6 ml 33.41 ml/m  RA Volume:   54.10 ml  30.32 ml/m LA Vol (A4C):   66.9 ml 37.50 ml/m LA Biplane Vol: 65.0 ml 36.43 ml/m  AORTIC VALVE AV Area (Vmax):    3.09 cm AV Area (Vmean):   3.04 cm AV Area (VTI):     3.04 cm AV Vmax:           214.50 cm/s AV Vmean:          136.750 cm/s AV VTI:            0.326 m AV Peak Grad:      18.4 mmHg AV Mean Grad:      9.0 mmHg LVOT Vmax:         174.50 cm/s LVOT Vmean:        109.500 cm/s LVOT VTI:          0.260 m  LVOT/AV VTI ratio: 0.80  AORTA Ao Root diam: 3.20 cm MITRAL VALVE                TRICUSPID VALVE MV Area (PHT): 3.72 cm     TR Peak grad:   27.9 mmHg MV Area VTI:   2.90 cm     TR Vmax:        264.00 cm/s MV Peak grad:  8.0 mmHg MV Mean grad:  4.0 mmHg     SHUNTS MV Vmax:       1.41 m/s     Systemic VTI:  0.26 m MV Vmean:      91.6 cm/s    Systemic Diam: 2.20 cm MV Decel Time: 204 msec MV E velocity: 90.70 cm/s MV A velocity: 117.00 cm/s MV E/A ratio:  0.78 Julien Nordmann MD Electronically signed by Julien Nordmann MD Signature Date/Time: 01/31/2023/5:43:31 PM    Final    DG Thoracic Spine 2 View  Result Date: 01/31/2023 CLINICAL DATA:  Back pain. 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 EXAM: THORACIC SPINE 2 VIEWS COMPARISON:  MRI of the cervicothoracic spine 01/24/2023. Intraoperative imaging of the thoracic spine 01/28/2023. Chest CTA 01/24/2023. 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 Counting superiorly from the T12 level,  the patient has undergone midthoracic spinal decompression with posterior pedicle screw and rod fusion from T4 through T9. The hardware appears intact and well positioned. Chronic superior endplate compression deformity at T11 is unchanged. No acute osseous complications are identified. There is mild atelectasis at both lung bases. IMPRESSION: No demonstrated acute findings status post recent midthoracic spinal decompression and fusion. Electronically Signed   By: Carey Bullocks M.D.   On: 01/31/2023 16:27   DG Chest Port 1 View  Result Date: 01/30/2023 CLINICAL DATA:  Cough, shortness of breath EXAM: PORTABLE CHEST 1 VIEW COMPARISON:  01/24/2023 FINDINGS: Cardiomegaly. Diffuse bilateral interstitial pulmonary opacity and trace pleural effusions. Interval thoracic fusion. IMPRESSION: Cardiomegaly with diffuse bilateral interstitial pulmonary opacity and trace pleural effusions, most consistent with  edema. Electronically Signed   By: Jearld Lesch M.D.   On: 01/30/2023 14:07   DG Thoracic Spine 2 View  Result Date: 01/28/2023 CLINICAL DATA:  Elective surgery. T4 through T9 fusion of biopsy for lesion with intra 3D imaging. EXAM: THORACIC SPINE 2 VIEWS COMPARISON:  Preoperative imaging. FINDINGS: Intraoperative 3D imaging of the thoracic spine obtained. Posterior rod with pedicle screws at multiple levels. Known paravertebral mass better assessed on preoperative imaging. No fluoroscopic spot views provided. Fluoroscopy time 1 minutes 30 seconds. Dose is 97.045 mGy IMPRESSION: Intraoperative 3D imaging of the thoracic spine. Electronically Signed   By: Narda Rutherford M.D.   On: 01/28/2023 16:28   DG C-Arm 1-60 Min-No Report  Result Date: 01/28/2023 Fluoroscopy was utilized by the requesting physician.  No radiographic interpretation.   DG C-Arm 1-60 Min-No Report  Result Date: 01/28/2023 Fluoroscopy was utilized by the requesting physician.  No radiographic interpretation.   DG C-Arm 1-60 Min-No Report  Result Date: 01/28/2023 Fluoroscopy was utilized by the requesting physician.  No radiographic interpretation.   DG C-Arm 1-60 Min-No Report  Result Date: 01/28/2023 Fluoroscopy was utilized by the requesting physician.  No radiographic interpretation.   MR THORACIC SPINE W WO CONTRAST  Result Date: 01/24/2023 CLINICAL DATA:  Soft tissue mass with bony destruction involving the left seventh rib and T6 and T7 vertebral bodies. EXAM: MRI CERVICAL AND THORACIC SPINE WITHOUT AND WITH CONTRAST TECHNIQUE: Multiplanar and multiecho pulse sequences of the cervical spine, to include the craniocervical junction and cervicothoracic junction, and the thoracic spine, were obtained without and with intravenous contrast. CONTRAST:  7mL GADAVIST GADOBUTROL 1 MMOL/ML IV SOLN COMPARISON:  Same day CTA chest, cervical and thoracic spine MRI 07/20/2020 FINDINGS: MRI CERVICAL SPINE FINDINGS Alignment: There is  trace retrolisthesis of C3 on C4 and trace anterolisthesis of C5 on C6. Vertebrae: Vertebral body heights are preserved. Background marrow signal is normal. A T1 hypointense/T2 hyperintense lesion in the clivus is unchanged since 2022, favored benign. There is no suspicious marrow signal abnormality or marrow edema in the cervical spine. There is no abnormal marrow enhancement in the cervical spine. There is a 6 mm enhancing lesion in the right occipital calvarium which was not seen on the prior cervical spine MRI from 2022; however, this area may not has been included within the field of view. Cord: Normal in signal and morphology. Posterior Fossa, vertebral arteries, paraspinal tissues: The imaged posterior fossa is unremarkable. The vertebral artery flow voids are normal. The paraspinal soft tissues are unremarkable. Disc levels: There is overall mild degenerative change in the cervical spine without significant spinal canal or neural foraminal stenosis. MRI THORACIC SPINE FINDINGS Alignment:  Normal. Vertebrae: Background marrow signal is normal. There is infiltrative  T1 hypointensity occupying most of the T6 and T7 vertebral bodies extending into the left pedicles and facets. There is a bulky soft tissue mass in the adjacent left paraspinal/subpleural soft tissues with destruction of the seventh rib. The bulk of the soft tissue component measures up to proximally 7.2 cm x 3.4 cm x 2.7 cm. 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 There is mild loss of vertebral body height at both levels consistent with associated pathologic fractures. There is compression deformity of the T11 vertebral body with up to approximately 30% loss of vertebral body height anteriorly and minimal bony  retropulsion. There is faint edema along the superior endplate consistent with acute to subacute chronicity. This fracture may be pathologic. There are probable additional lesions involving multiple additional ribs (for example 25-21, 25-24). The above findings are new since the MRI from 2022. Cord:  There is no cord signal abnormality or abnormal enhancement. Paraspinal and other soft tissues: Unremarkable, aside from the bulky paraspinal tumor described above. Disc levels: There is a small disc protrusion at T10-T11 without significant spinal canal or neural foraminal stenosis. Otherwise, there is overall minimal background degenerative change without significant spinal canal or neural foraminal stenosis. IMPRESSION: 1. Large soft tissue mass in the left paraspinal soft tissues at T6-T7 with destruction of the seventh rib and invasion of the T6 and T7 vertebral bodies with involvement of the left posterior elements as well as epidural tumor in the spinal canal resulting in moderate spinal canal stenosis without frank cord compression or cord edema. 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 2. Acute to subacute compression fracture of the T11 vertebral body with up to approximately 30% loss of vertebral body height and minimal bony retropulsion, possibly pathologic. 3. Small enhancing lesion in the right occipital calvarium, not definitely present on the prior cervical spine from 2022 (though possibly not included within the field of view on that study), suspicious for an additional metastatic or myeloma lesion. 4. Additional probable lesions involving multiple additional ribs. 5. No acute or suspicious finding in the cervical spine. Electronically Signed   By: Lesia Hausen M.D.   On: 01/24/2023 14:33   MR Cervical Spine W and Wo Contrast  Result Date: 01/24/2023 CLINICAL DATA:  Soft tissue mass with bony destruction involving the left seventh rib and T6 and  T7 vertebral bodies. EXAM: MRI CERVICAL AND THORACIC SPINE WITHOUT AND WITH CONTRAST TECHNIQUE: Multiplanar and multiecho pulse sequences of the cervical spine, to include the craniocervical junction and cervicothoracic junction, and the thoracic spine, were obtained without and with intravenous contrast. CONTRAST:  7mL GADAVIST GADOBUTROL 1 MMOL/ML IV SOLN COMPARISON:  Same day CTA chest, cervical and thoracic spine MRI 07/20/2020 FINDINGS: MRI CERVICAL SPINE FINDINGS Alignment: There is trace retrolisthesis of C3 on C4 and trace anterolisthesis of C5 on C6. Vertebrae: Vertebral body heights are preserved. Background marrow signal is normal. A T1 hypointense/T2 hyperintense lesion in the clivus is unchanged since 2022, favored benign. There is no suspicious marrow signal abnormality or marrow edema in the cervical spine. There is no abnormal marrow enhancement in the cervical spine. There is a 6 mm enhancing lesion in the right occipital calvarium which was not seen on the prior cervical spine MRI from 2022;  however, this area may not has been included within the field of view. Cord: Normal in signal and morphology. Posterior Fossa, vertebral arteries, paraspinal tissues: The imaged posterior fossa is unremarkable. The vertebral artery flow voids are normal. The paraspinal soft tissues are unremarkable. Disc levels: There is overall mild degenerative change in the cervical spine without significant spinal canal or neural foraminal stenosis. MRI THORACIC SPINE FINDINGS Alignment:  Normal. Vertebrae: Background marrow signal is normal. There is infiltrative T1 hypointensity occupying most of the T6 and T7 vertebral bodies extending into the left pedicles and facets. There is a bulky soft tissue mass in the adjacent left paraspinal/subpleural soft tissues with destruction of the seventh rib. The bulk of the soft tissue component measures up to proximally 7.2 cm x 3.4 cm x 2.7 cm. There is extension into the T6-T7 and  T7-T8 neural foramina as well as epidural tumor along the ventral and left dorsal lateral aspects of the spinal canal resulting in moderate spinal canal stenosis with effacement of the thecal sac but no frank cord compression or cord edema. There is mild loss of vertebral body height at both levels consistent with associated pathologic fractures. There is compression deformity of the T11 vertebral body with up to approximately 30% loss of vertebral body height anteriorly and minimal bony retropulsion. There is faint edema along the superior endplate consistent with acute to subacute chronicity. This fracture may be pathologic. There are probable additional lesions involving multiple additional ribs (for example 25-21, 25-24). The above findings are new since the MRI from 2022. Cord:  There is no cord signal abnormality or abnormal enhancement. Paraspinal and other soft tissues: Unremarkable, aside from the bulky paraspinal tumor described above. Disc levels: There is a small disc protrusion at T10-T11 without significant spinal canal or neural foraminal stenosis. Otherwise, there is overall minimal background degenerative change without significant spinal canal or neural foraminal stenosis. IMPRESSION: 1. Large soft tissue mass in the left paraspinal soft tissues at T6-T7 with destruction of the seventh rib and invasion of the T6 and T7 vertebral bodies with involvement of the left posterior elements as well as epidural tumor in the spinal canal resulting in moderate spinal canal stenosis without frank cord compression or cord edema. 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 2. Acute to subacute compression fracture of the T11 vertebral body with up to approximately 30% loss of vertebral body height and minimal bony retropulsion, possibly pathologic. 3. Small enhancing lesion in the right occipital calvarium, not definitely present on the prior cervical spine  from 2022 (though possibly not included within the field of view on that study), suspicious for an additional metastatic or myeloma lesion. 4. Additional probable lesions involving multiple additional ribs. 5. No acute or suspicious finding in the cervical spine. Electronically Signed   By: Lesia Hausen M.D.   On: 01/24/2023 14:33   CT Angio Chest PE W and/or Wo Contrast  Result Date: 01/24/2023 CLINICAL DATA:  Chest pain and shortness of breath for a month. EXAM: CT ANGIOGRAPHY CHEST WITH CONTRAST TECHNIQUE: Multidetector CT imaging of the chest was performed using the standard protocol during bolus administration of intravenous contrast. Multiplanar CT image reconstructions and MIPs were obtained to evaluate the vascular anatomy. RADIATION DOSE REDUCTION: This exam was performed according to the departmental dose-optimization program which includes automated exposure control, adjustment of the mA and/or kV according to patient size and/or use of iterative reconstruction technique. CONTRAST:  75mL OMNIPAQUE IOHEXOL 350 MG/ML SOLN COMPARISON:  X-ray 01/24/2023 and older.  Previous CT January 2017 FINDINGS: Cardiovascular: The thoracic aorta has a normal course and caliber with minimal calcified plaque. There is a bovine type aortic arch, normal variant. Coronary artery calcifications are seen. Please correlate for other coronary risk factors. Heart is nonenlarged. There is slight wall thickening of the left ventricle. Prominent fat along the intra-atrial septum. Pulmonary arteries show some slight enlargement centrally. Please correlate for any evidence of pulmonary artery hypertension. There is heterogeneous enhancement of the pulmonary arterial tree limiting evaluation for emboli. No obvious large and central embolus although several areas are nondiagnostic. Mediastinum/Nodes: Surgical changes identified with a moderate hiatal hernia. Small thyroid gland. No specific abnormal lymph node enlargement identified  in the axillary region, hilum or mediastinum. There are some small nodes identified in the posterior mediastinum, posterior to the descending thoracic aorta such as series 4, image 86 but not pathologic by size criteria. Lungs/Pleura: There is some linear opacity lung bases likely scar or atelectasis. No consolidation. Breathing motion identified. No pleural effusion or pneumothorax. 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 Benign hepatic cysts as well. Musculoskeletal: There is large destructive mass involving the posterior aspect of the left seventh rib with extension to involve the central canal of the spine in the associated vertebral bodies of T6 and T7. Cord compression is possible. This mass is estimated in diameter on series 4, image 65 at 8.5 by 4.3 cm. Slight extension along the extrapleural space in the posterior mediastinum as well. There is question of some subtle other lucent lesions identified along the spine but indeterminate. Critical Value/emergent results were called by telephone at the time of interpretation on 01/24/2023 at 8:41 am to provider [facility] , who verbally acknowledged these results. Review of the MIP images confirms the above findings. IMPRESSION: Aggressive soft tissue mass with bone destruction involving the left seventh rib as well as the T6 and T7 vertebral bodies and central canal. A neoplastic process is possible. There also is a possibility of significant cord compression with canal encroachment along the spine. Recommend further evaluation with spine MRI with and without contrast and a whole-body bone scan. There appears to be some other subtle lucent bone lesions along the spine and sternum. Please correlate for any history of known malignancy including such processes as myeloma or other. Poor opacification of the pulmonary arterial tree limiting evaluation. Several areas are non diagnostic for pulmonary emboli.  No obvious large and central embolus. Aortic Atherosclerosis (ICD10-I70.0). Electronically Signed   By: Karen Kays M.D.   On: 01/24/2023 11:47   DG Chest 2 View  Result Date: 01/24/2023 CLINICAL DATA:  Chest pain EXAM: CHEST - 2 VIEW COMPARISON:  Chest x-ray dated January 07, 2023 FINDINGS: The heart size and mediastinal contours are within normal limits. Small hiatal hernia. Both lungs are clear. Pleural-based nodular opacity of the left posterior hemithorax. The visualized skeletal structures are unremarkable. IMPRESSION: Pleural-based nodular opacity of the left posterior hemithorax. Recommend further evaluation with contrast enhanced chest CT. Electronically Signed   By: Allegra Lai M.D.   On: 01/24/2023 09:38   DG Ribs Unilateral Left  Result Date: 01/09/2023 CLINICAL DATA:  Fall, left rib pain. EXAM: LEFT RIBS - 2 VIEW COMPARISON:  Chest radiograph 11/01/2022 FINDINGS: The cardiomediastinal silhouette is stable and within normal limits. Linear opacities in the left midlung likely reflect atelectasis or scar. There is no other focal airspace opacity. There is no pulmonary edema. There is  no pleural effusion or pneumothorax There is no displaced rib fracture or other acute osseous abnormality. IMPRESSION: No displaced rib fracture or other acute osseous abnormality identified. Electronically Signed   By: Lesia Hausen M.D.   On: 01/09/2023 17:13   DG Lumbar Spine Complete  Result Date: 01/09/2023 CLINICAL DATA:  Trauma, fall, pain EXAM: LUMBAR SPINE - COMPLETE 4+ VIEW COMPARISON:  12/24/2018 FINDINGS: No recent fracture is seen. Alignment of posterior margins of vertebral bodies appears normal. Small bony spurs are noted. Facet hypertrophy is seen, more so at L5-S1 level. Arterial calcifications are seen in aorta. IMPRESSION: No recent fracture is seen. Degenerative changes are noted, more so in facet joints in lower lumbar spine. Aortic arteriosclerosis. Electronically Signed   By: Ernie Avena M.D.   On: 01/09/2023 17:12    Labs:  CBC: Recent Labs    02/01/23 0409 02/02/23 0644 02/03/23 0315 02/04/23 0527  WBC 7.0 8.6 8.9 11.9*  HGB 7.7* 7.9* 7.9* 8.9*  HCT 22.4* 23.1* 24.1* 26.5*  PLT 236 284 339 397    COAGS: Recent Labs    01/28/23 0336  INR 1.0  APTT 26    BMP: Recent Labs    02/01/23 0409 02/02/23 0644 02/03/23 0315 02/04/23 0527  NA 132* 132* 132* 132*  K 4.0 4.5 4.9 4.8  CL 100 99 97* 96*  CO2 24 24 27 29   GLUCOSE 88 105* 85 91  BUN 32* 35* 32* 24*  CALCIUM 11.0* 12.1* 12.7* 13.8*  CREATININE 1.04* 1.36* 1.17* 1.01*  GFRNONAA >60 45* 54* >60    LIVER FUNCTION TESTS: Recent Labs    06/13/22 1402 09/10/22 1329  BILITOT 0.6 0.4  AST 17 20  ALT 16 17  ALKPHOS 50 48  PROT 8.9* 8.6*  ALBUMIN 2.9* 3.0*    TUMOR MARKERS: No results for input(s): "AFPTM", "CEA", "CA199", "CHROMGRNA" in the last 8760 hours.  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 Patient has been referred to IR for image-guided bone marrow biopsy to further evaluate staging. Case reviewed and tentatively scheduled for 02/05/23 to be performed by Dr Juliette Alcide. Patient to be NPO at midnight tonight.  Risks and benefits of image-guided bone marrow biopsy was discussed with the patient and/or patient's family including, but not limited to bleeding, infection, damage to adjacent structures or low yield requiring additional tests.  All of the questions were answered and there is agreement to proceed.  Consent signed and in chart.   Thank you for this interesting consult.  I greatly enjoyed meeting Paula Garrett and look forward to participating in their care.  A copy of this report was sent to the requesting provider on this date.  Electronically Signed: Kennieth Francois, PA-C 02/04/2023, 3:47 PM   I spent a total of 20 Minutes in face to face in clinical consultation, greater than 50% of  which was counseling/coordinating care for plasmacytoma.

## 2023-02-04 NOTE — Progress Notes (Signed)
Progress Note   Patient: Paula Garrett VHQ:[id number] DOB: [birth year] DOA: 01/24/2023     11 DOS: the patient was seen and examined on 02/04/2023   Brief hospital course: Paula Garrett is a 57 year old female with history of hypertension, depression, anxiety, history of tobacco use, GERD, who presents to the emergency department for chief concerns of chest pain and shortness of breath for 1 month.  Patient also has known multiple myeloma which has been followed as outpatient without requiring any treatment. Imaging study showed large soft tissue mass in left paraspinal soft tissue at T6-T7 with destruction of seventh rib and invasion of T6 and T7 vertebral bodies involving left posterior elements as well as epidural tumor and spinal canal with moderate spinal canal stenosis without frank cord compression or cord edema, acute/subacute compression fracture of T11 vertebral body with up to 30% vertebral body height and minimal bony retropulsion, possibly pathologic. Right occipital calvarium with small enhancing lesion, suspicious for additional metastatic or myeloma lesion. Patient has been seen by neurosurgery, started on IV steroids.   Surgery 8/13: 1. Posterior Segmental Instrumentation T4 to T9; 2. Posterolateral arthrodesis from T4 to T9; 3. Thoracic Laminotomy at T6 and T7.  8/14.  Patient walked to the bathroom and her drain was disconnected.  Blood was dripping out of the drain.  I contacted neurosurgery and okay to clean with alcohol swab and hooked back the drain. 8/15.  Patient having a lot more pain today.  Having some cough.  Chest x-ray concerning for fluid overload.  IV fluids discontinued this morning.  Give a dose of Lasix. 8/16.  Patient was on high flow nasal cannula 6 L.  Lasix ordered twice daily.  Antibiotics empirically started just in case infection. 8/17.  Down to 2 L.  Try to taper off oxygen.  Continue Lasix twice daily.  Repeat chest x-ray consistent with fluid overload.   Echocardiogram showed a normal EF. 8/18.  Hold Lasix with creatinine up to 1.37.  Calcium also increased with diuresis.  Will check again tomorrow. 8/19.  Unable to come off oxygen today.  Patient able to walk on 100 feet today.  Calcium rose up to 12.7 and pamidronate ordered.  CT scan head negative for stroke but did show bone lesions consistent with multiple myeloma.  Cut back on pain medications secondary to altered mental status 8/20.  Patient having a lot of pain this morning.  Calcium up a little bit further.  Started calcitonin until pamidronate starts working.  Oncology increased oxycodone to 20 mg every 12 hours and we resumed low-dose gabapentin and increased oxycodone dose.  Assessment and Plan: * Hypercalcemia With acute metabolic encephalopathy.  Last calcium 13.8.   Likely increase in calcium is secondary to diuretics and multiple myeloma.  Received pamidronate on 8/19.  Will give calcitonin today.  Acute hypoxic respiratory failure (HCC) Patient had a pulse ox of 86% on nasal cannula on 8/16.  Patient placed on high flow nasal cannula on 8/16.  Patient still on 1 to 2 L of oxygen.  Would like to try to get off oxygen prior to disposition.   Fluid overload Chest x-ray concerning for fluid overload.  Hold Lasix.  Echocardiogram with a normal EF of 60%.  Empirically gave antibiotics just in case infection.  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  Bone marrow biopsy can be done on Wednesday.  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 Katrinka Blazing.  Patient had a  posterior segmental instrumentation T4-T9, posterior lateral arthrodesis from T4-T9, thoracic laminectomy at T6 and T7.  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  Oncology increased oxycodone to 20 mg twice daily.  Placed back on low-dose gabapentin.  Confusion Likely hypercalcemia related.  Patient with a lot more pain after cutting back pain medications yesterday.   Oncology went up on OxyContin dose today.  CT scan of the head negative  Drug induced constipation Continue medications for constipation.  Acute blood loss anemia Hemoglobin 8.9 today.  AKI (acute kidney injury) (HCC) Hold Lasix today with creatinine up to 1.01.  Recheck creatinine tomorrow.  Hyponatremia Patient on salt tablets.  Sodium slowly rising to 132.  Benign essential hypertension Holding medications.  Barrett's esophagus without dysplasia Continue Protonix  Anxiety Continue Lexapro and clonazepam  Obesity (BMI 30-39.9) Last BMI 35.36  Asthma nebulizer standing dose.  Hypothyroidism Continue levothyroxine.  Insomnia As needed Ambien        Subjective: Patient seen this morning.  Having a lot of pain and did not sleep very well last night.  Calcium still high today.  Calcitonin started.  Physical Exam: Vitals:   02/03/23 2033 02/04/23 0500 02/04/23 0541 02/04/23 0840  BP:   (!) 159/96 (!) 141/96  Pulse:   78 88  Resp:   19 18  Temp:   98.3 F (36.8 C) 98.1 F (36.7 C)  TempSrc:   Oral Oral  SpO2: 92%  95% 95%  Weight:  87.7 kg    Height:       Physical Exam HENT:     Head: Normocephalic.     Mouth/Throat:     Pharynx: No oropharyngeal exudate.  Eyes:     General: Lids are normal.     Conjunctiva/sclera: Conjunctivae normal.  Cardiovascular:     Rate and Rhythm: Normal rate and regular rhythm.     Heart sounds: Normal heart sounds, S1 normal and S2 normal.  Pulmonary:     Breath sounds: Examination of the right-lower field reveals decreased breath sounds and rhonchi. Examination of the left-lower field reveals decreased breath sounds and rhonchi. Decreased breath sounds and rhonchi present. No wheezing or rales.  Musculoskeletal:     Right lower leg: No swelling.     Left lower leg: No swelling.  Skin:    General: Skin is warm.     Findings: No rash.  Neurological:     Mental Status: She is alert.     Comments: For me answers all  questions appropriately.  But sleeping a lot today.     Data Reviewed: White blood cell count 11.9, hemoglobin 8.9, creatinine 1.01, calcium 13.8, sodium 132  Family Communication: Spoke with neighbor that is going to take care of her at home.  Disposition: Status is: Inpatient Remains inpatient appropriate because: Would like to watch until calcium comes down after pamidronate starts working.  Would like to have mental status better.  Would like to get off oxygen prior to disposition.  Will have a bone marrow biopsy tomorrow.  Planned Discharge Destination: Home with Home Health    Time spent: 28 minutes  Author: Alford Highland, MD 02/04/2023 4:14 PM  For on call review [email or web address].

## 2023-02-04 NOTE — Progress Notes (Signed)
Neurosurgery Progress Note  History: Paula Garrett is a 57 y.o s/p T4-9 PSF, T6-7 lamiotomy for tumor  POD7: Pt more drowsy this morning  POD4: Stable overnight. At baseline this morning POD3: Increased back pain this morning in addition to some SOB. Pt currently getting breathing treatment  POD2: Pt reporting more back pain this morning POD1: Some trouble with the drain coming disconnected overnight. Pt feels her back is well controlled and denies any concerns  Physical Exam: Vitals:   02/04/23 0541 02/04/23 0840  BP: (!) 159/96 (!) 141/96  Pulse: 78 88  Resp: 19 18  Temp: 98.3 F (36.8 C) 98.1 F (36.7 C)  SpO2: 95% 95%    Drowsy but arouses easily to voice.  Oriented to self and place.  Disoriented to time CNI MAEW  Incision healing appropriately and covered with clean dressing  Data:  Other tests/results: Pathology pending   Assessment/Plan:  Paula Garrett is a 57 year old with a history of multiple myeloma found to have lytic lesions at T6 and T7 status post T4-9 posterior spinal fusion and T6-7 laminotomies.  - mobilize - pain control; will make some medication changes given patients increased pain level - ok for DVT prophylaxis - PTOT - wound VAC removed and clean dressing placed.  Dressing can be removed on 8/22 - remainder of care per primary team. - Please call with any questions or concerns.   Manning Charity PA-C Department of Neurosurgery

## 2023-02-05 ENCOUNTER — Other Ambulatory Visit: Payer: Self-pay

## 2023-02-05 ENCOUNTER — Inpatient Hospital Stay: Payer: Managed Care, Other (non HMO) | Admitting: Radiology

## 2023-02-05 ENCOUNTER — Other Ambulatory Visit: Payer: Self-pay | Admitting: *Deleted

## 2023-02-05 DIAGNOSIS — D472 Monoclonal gammopathy: Secondary | ICD-10-CM

## 2023-02-05 DIAGNOSIS — D509 Iron deficiency anemia, unspecified: Secondary | ICD-10-CM

## 2023-02-05 HISTORY — PX: IR BONE MARROW BIOPSY & ASPIRATION: IMG5727

## 2023-02-05 LAB — BASIC METABOLIC PANEL
Anion gap: 8 (ref 5–15)
BUN: 16 mg/dL (ref 6–20)
CO2: 28 mmol/L (ref 22–32)
Calcium: 11.7 mg/dL — ABNORMAL HIGH (ref 8.9–10.3)
Chloride: 95 mmol/L — ABNORMAL LOW (ref 98–111)
Creatinine, Ser: 0.93 mg/dL (ref 0.44–1.00)
GFR, Estimated: >60 mL/min (ref >60)
Glucose, Bld: 116 mg/dL — ABNORMAL HIGH (ref 70–99)
Potassium: 4.3 mmol/L (ref 3.5–5.1)
Sodium: 131 mmol/L — ABNORMAL LOW (ref 135–145)

## 2023-02-05 LAB — CBC WITH DIFFERENTIAL/PLATELET
Abs Immature Granulocytes: 0.1 10*3/uL — ABNORMAL HIGH (ref 0.00–0.07)
Basophils Absolute: 0 10*3/uL (ref 0.0–0.1)
Basophils Relative: 0 %
Eosinophils Absolute: 0 10*3/uL (ref 0.0–0.5)
Eosinophils Relative: 0 %
HCT: 27.2 % — ABNORMAL LOW (ref 36.0–46.0)
Hemoglobin: 9 g/dL — ABNORMAL LOW (ref 12.0–15.0)
Immature Granulocytes: 1 %
Lymphocytes Relative: 13 %
Lymphs Abs: 1.3 10*3/uL (ref 0.7–4.0)
MCH: 31.3 pg (ref 26.0–34.0)
MCHC: 33.1 g/dL (ref 30.0–36.0)
MCV: 94.4 fL (ref 80.0–100.0)
Monocytes Absolute: 0.9 10*3/uL (ref 0.1–1.0)
Monocytes Relative: 8 %
Neutro Abs: 7.9 10*3/uL — ABNORMAL HIGH (ref 1.7–7.7)
Neutrophils Relative %: 78 %
Platelets: 458 10*3/uL — ABNORMAL HIGH (ref 150–400)
RBC: 2.88 MIL/uL — ABNORMAL LOW (ref 3.87–5.11)
RDW: 13.1 % (ref 11.5–15.5)
WBC: 10.2 10*3/uL (ref 4.0–10.5)
nRBC: 0 % (ref 0.0–0.2)

## 2023-02-05 MED ORDER — SODIUM CHLORIDE 0.9 % IV SOLN
INTRAVENOUS | Status: DC
  Administered 2023-02-05: 250 mL via INTRAVENOUS

## 2023-02-05 MED ORDER — AMLODIPINE BESYLATE 10 MG PO TABS
10.0000 mg | ORAL_TABLET | Freq: Every day | ORAL | Status: DC
  Administered 2023-02-05 – 2023-02-06 (×2): 10 mg via ORAL
  Filled 2023-02-05 (×2): qty 1

## 2023-02-05 MED ORDER — HEPARIN SOD (PORK) LOCK FLUSH 100 UNIT/ML IV SOLN
INTRAVENOUS | Status: AC
  Filled 2023-02-05: qty 5

## 2023-02-05 MED ORDER — MIDAZOLAM HCL 2 MG/2ML IJ SOLN
INTRAMUSCULAR | Status: AC
  Filled 2023-02-05: qty 2

## 2023-02-05 MED ORDER — LIDOCAINE 1 % OPTIME INJ - NO CHARGE
10.0000 mL | Freq: Once | INTRAMUSCULAR | Status: AC
  Administered 2023-02-05: 10 mL
  Filled 2023-02-05: qty 10

## 2023-02-05 MED ORDER — FENTANYL CITRATE (PF) 100 MCG/2ML IJ SOLN
INTRAMUSCULAR | Status: AC
  Filled 2023-02-05: qty 2

## 2023-02-05 MED ORDER — FENTANYL CITRATE (PF) 100 MCG/2ML IJ SOLN
INTRAMUSCULAR | Status: AC | PRN
  Administered 2023-02-05: 50 ug via INTRAVENOUS
  Administered 2023-02-05: 25 ug via INTRAVENOUS

## 2023-02-05 MED ORDER — MIDAZOLAM HCL 2 MG/2ML IJ SOLN
INTRAMUSCULAR | Status: AC | PRN
  Administered 2023-02-05: .5 mg via INTRAVENOUS
  Administered 2023-02-05: 1 mg via INTRAVENOUS

## 2023-02-05 NOTE — Progress Notes (Addendum)
Physical Therapy Treatment Patient Details Name: Paula Garrett MRN: [id number] DOB: April 04, 1966 Today's Date: 02/05/2023   History of Present Illness Pt is a 57 year old female presenting to the ED with chest pain and SOB. Imaging study showed large soft tissue mass in left paraspinal soft tissue at T6-T7 with destruction of seventh rib and invasion of T6 and T7 vertebral bodies involving left posterior elements as well as epidural tumor and spinal canal with moderate spinal canal stenosis without frank cord compression or cord edema, acute/subacute compression fracture of T11 vertebral body with up to 30% vertebral body height and minimal bony retropulsion, possibly pathologic. Right occipital calvarium with small enhancing lesion, suspicious for additional metastatic or myeloma lesion.Pt is now s/p Posterior Segmental Instrumentation T4 to T9, Posterolateral arthrodesis from T4 to T9 ,Thoracic Laminotomy at T6 and T7 01/28/23.        PMH significant for HTN, depression, anxiety, history of tobacco use, GERD, known multiple myeloma followed as outpatient    PT Comments  Pt presents laying in bed, no complaints of pain.  Pt able to perform supine<>sit with modi and repeated cueing for safety. Pt required supervision-CGA for sit<>stands and CGA for ambulating ~341ft with/without RW. Pt able to ambulate ~162ft with no AD, but PT noted increased instability, intermittent crossover stepping, and reaching for rails, improved steadiness with RW. Pt overall with decreased safety awareness and exhibited impulsive tendencies, decreased awareness of surroundings, difficulty following commands, and altered cognition from baseline throughout session though pt endorsed feeling she was at her baseline. She would benefit from continued skilled therapy to maximize functional abilities.    If plan is discharge home, recommend the following: A little help with walking and/or transfers;A little help with  bathing/dressing/bathroom;Help with stairs or ramp for entrance;Direct supervision/assist for medications management;Assistance with cooking/housework;Assist for transportation   Can travel by private vehicle        Equipment Recommendations  Rolling walker (2 wheels)    Recommendations for Other Services       Precautions / Restrictions Precautions Precautions: Fall;Back Required Braces or Orthoses: Spinal Brace Spinal Brace: Thoracolumbosacral orthotic Restrictions Weight Bearing Restrictions: No     Mobility  Bed Mobility Overal bed mobility: Modified Independent Bed Mobility: Supine to Sit, Sit to Supine     Supine to sit: Modified independent (Device/Increase time), HOB elevated Sit to supine: Modified independent (Device/Increase time), HOB elevated        Transfers Overall transfer level: Needs assistance Equipment used: None Transfers: Sit to/from Stand Sit to Stand: Supervision, Contact guard assist                Ambulation/Gait Ambulation/Gait assistance: Contact guard assist Gait Distance (Feet): 300 Feet Assistive device: None, Rolling walker (2 wheels) Gait Pattern/deviations: Decreased step length - left, Decreased step length - right, Decreased stride length Gait velocity: decreased     General Gait Details: pt overall unsteady without AD and exhibited decreased awareness of surroundings during ambulation   Stairs             Wheelchair Mobility     Tilt Bed    Modified Rankin (Stroke Patients Only)       Balance Overall balance assessment: Needs assistance Sitting-balance support: Feet supported Sitting balance-Leahy Scale: Good     Standing balance support: During functional activity, Bilateral upper extremity supported, No upper extremity supported Standing balance-Leahy Scale: Fair  Cognition Arousal: Lethargic, Alert Behavior During Therapy: Impulsive Overall Cognitive  Status: Impaired/Different from baseline Area of Impairment: Safety/judgement, Problem solving                         Safety/Judgement: Decreased awareness of safety   Problem Solving: Slow processing          Exercises      General Comments General comments (skin integrity, edema, etc.): Pt SPO2 remained >90% for majority of session, PT noted drop to 89% immediately following mobility that recovered with deep breathing techniques.      Pertinent Vitals/Pain Pain Assessment Pain Assessment: No/denies pain    Home Living                          Prior Function            PT Goals (current goals can now be found in the care plan section) Acute Rehab PT Goals Patient Stated Goal: to go home Progress towards PT goals: Progressing toward goals    Frequency    Min 1X/week      PT Plan      Co-evaluation              AM-PAC PT "6 Clicks" Mobility   Outcome Measure  Help needed turning from your back to your side while in a flat bed without using bedrails?: None Help needed moving from lying on your back to sitting on the side of a flat bed without using bedrails?: None Help needed moving to and from a bed to a chair (including a wheelchair)?: A Little Help needed standing up from a chair using your arms (e.g., wheelchair or bedside chair)?: A Little Help needed to walk in hospital room?: A Little Help needed climbing 3-5 steps with a railing? : A Little 6 Click Score: 20    End of Session Equipment Utilized During Treatment: Oxygen Activity Tolerance: Patient tolerated treatment well Patient left: in bed;with call bell/phone within reach   PT Visit Diagnosis: Muscle weakness (generalized) (M62.81);Unsteadiness on feet (R26.81);Other abnormalities of gait and mobility (R26.89)     Time: 4098-1191 PT Time Calculation (min) (ACUTE ONLY): 18 min  Charges:    $Therapeutic Activity: 8-22 mins PT General Charges $$ ACUTE PT VISIT:  1 Visit                    Precilla Purnell, PT, SPT 3:58 PM,02/05/23

## 2023-02-05 NOTE — Progress Notes (Signed)
Report given to patients care nurse 127, with POC reviewed, Questions answered.

## 2023-02-05 NOTE — Progress Notes (Signed)
Patient clinically stable post IR BMB per Dr Juliette Alcide, tolerated well. States 8/10 aching in lower back and hips, however unchanged from pre procedure. Received Versed 1.5 mg along with Fentanyl 75 mcg IV for procedure. Report given to Weldon Picking RN post procedure/specials/11

## 2023-02-05 NOTE — Procedures (Signed)
Interventional Radiology Procedure Note  Date of Procedure: 02/05/2023  Procedure: IR BMBx   Findings:  1. IR BMBx right posterior ilium    Complications: No immediate complications noted.   Estimated Blood Loss: minimal  Follow-up and Recommendations: 1. Bedrest 1 hour    Olive Bass, MD  Vascular & Interventional Radiology  02/05/2023 9:24 AM

## 2023-02-05 NOTE — Discharge Instructions (Signed)
  Your surgeon has performed an operation on your lumbar spine (low back) to relieve pressure on one or more nerves. Many times, patients feel better immediately after surgery and can "overdo it." Even if you feel well, it is important that you follow these activity guidelines. If you do not let your back heal properly from the surgery, you can increase the chance of hardware complications and/or return of your symptoms. The following are instructions to help in your recovery once you have been discharged from the hospital.  Do not use NSAIDs for 3 months after surgery.   Activity    No bending, lifting, or twisting ("BLT"). Avoid lifting objects heavier than 10 pounds (gallon milk jug).  Where possible, avoid household activities that involve lifting, bending, pushing, or pulling such as laundry, vacuuming, grocery shopping, and childcare. Try to arrange for help from friends and family for these activities while your back heals.  Increase physical activity slowly as tolerated.  Taking short walks is encouraged, but avoid strenuous exercise. Do not jog, run, bicycle, lift weights, or participate in any other exercises unless specifically allowed by your doctor. Avoid prolonged sitting, including car rides.  Talk to your doctor before resuming sexual activity.  You should not drive until cleared by your doctor.  Until released by your doctor, you should not return to work or school.  You should rest at home and let your body heal.   You may shower three days after your surgery.  After showering, lightly dab your incision dry. Do not take a tub bath or go swimming for 3 weeks, or until approved by your doctor at your follow-up appointment.  If you smoke, we strongly recommend that you quit.  Smoking has been proven to interfere with normal healing in your back and will dramatically reduce the success rate of your surgery. Please contact [facility] (800-QUIT-NOW) and use the resources at  www.[facility].com for assistance in stopping smoking.  Surgical Incision   If you have a dressing on your incision, you may remove it three days after your surgery. Keep your incision area clean and dry.   Diet            You may return to your usual diet. Be sure to stay hydrated.  When to Contact us  Although your surgery and recovery will likely be uneventful, you may have some residual numbness, aches, and pains in your back and/or legs. This is normal and should improve in the next few weeks.  However, should you experience any of the following, contact us immediately: New numbness or weakness Pain that is progressively getting worse, and is not relieved by your pain medications or rest Bleeding, redness, swelling, pain, or drainage from surgical incision Chills or flu-like symptoms Fever greater than 101.0 F (38.3 C) Problems with bowel or bladder functions Difficulty breathing or shortness of breath Warmth, tenderness, or swelling in your calf  Contact Information During office hours (Monday-Friday 9 am to 5 pm), please call your physician at [phone] and ask for Sharlot Gowda After hours and weekends, please call [phone] and speak with the neurosurgeon on call For a life-threatening emergency, call 911

## 2023-02-05 NOTE — Plan of Care (Signed)

## 2023-02-05 NOTE — Progress Notes (Signed)
Progress Note   Patient: Paula Garrett WUJ:[id number] DOB: 1966/05/28 DOA: 01/24/2023     12 DOS: the patient was seen and examined on 02/05/2023   Brief hospital course: [name] is a 57 year old female with history of hypertension, depression, anxiety, history of tobacco use, GERD, who presents to the emergency department for chief concerns of chest pain and shortness of breath for 1 month.  Patient also has known multiple myeloma which has been followed as outpatient without requiring any treatment. Imaging study showed large soft tissue mass in left paraspinal soft tissue at T6-T7 with destruction of seventh rib and invasion of T6 and T7 vertebral bodies involving left posterior elements as well as epidural tumor and spinal canal with moderate spinal canal stenosis without frank cord compression or cord edema, acute/subacute compression fracture of T11 vertebral body with up to 30% vertebral body height and minimal bony retropulsion, possibly pathologic. Right occipital calvarium with small enhancing lesion, suspicious for additional metastatic or myeloma lesion. Patient has been seen by neurosurgery, started on IV steroids.   Surgery 8/13: 1. Posterior Segmental Instrumentation T4 to T9; 2. Posterolateral arthrodesis from T4 to T9; 3. Thoracic Laminotomy at T6 and T7.  8/19.  Unable to come off oxygen today.  Patient able to walk on 100 feet today.  Calcium rose up to 12.7 and pamidronate ordered.  CT scan head negative for stroke but did show bone lesions consistent with multiple myeloma.  Cut back on pain medications secondary to altered mental status 8/20.  Patient having a lot of pain this morning.  Calcium up a little bit further.  Started calcitonin until pamidronate starts working.  Oncology increased oxycodone to 20 mg every 12 hours and we resumed low-dose gabapentin and increased oxycodone dose. 8/21--started seeing pt today. Just got back from her right hilum  BM bx per IR. Sister  at bedside. Pt very tearful, emotional. Wants to go home. Lot of pain.   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  Last calcium 11.7 --   Received pamidronate on 8/19 on calcitonin  --check BMP in am   Acute hypoxic respiratory failure [facility]) Patient had a pulse ox of 86% on nasal cannula on 8/16.  Patient placed on high flow nasal cannula on 8/16.  Patient still on 1 to 2 L of oxygen.  Would like to try to get off oxygen prior to disposition. --wean to RA as able to   Fluid overload Chest x-ray concerning for fluid overload.  Hold Lasix.  Echocardiogram with a normal EF of 60%.  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 Katrinka Blazing.   --Patient had a posterior segmental instrumentation T4-T9, posterior lateral arthrodesis from T4-T9, thoracic laminectomy at T6 and T7.   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  -- Oncology increased oxycodone to 20 mg twice daily.  And added dexamethasone --Placed back on low-dose gabapentin.   Confusion --Likely hypercalcemia and narcotics related.  Patient with a lot more pain after cutting back pain medications yesterday.   --  CT scan of the head negative   Drug induced constipation --Continue medications for constipation.   Acute blood loss anemia --Hemoglobin 8.9    AKI (acute kidney injury) (HCC) Creat down to 0.93  Hyponatremia Patient on salt tablets.  Sodium slowly rising to 132.   Benign essential hypertension --restart amlodipine   Barrett's esophagus without dysplasia Continue Protonix   Anxiety Continue Lexapro and clonazepam   Obesity (BMI 30-39.9) Last BMI 35.36   Asthma nebulizer standing dose.   Hypothyroidism Continue levothyroxine.    Insomnia As needed Ambien  HHPT at discharge. Will d/w Dr Smith Robert regarding d/c plans         Subjective: just got back from BM bx. Tearful. In pain. Sister at bedside  Physical Exam: Vitals:   02/05/23 0930 02/05/23 0945 02/05/23 1000 02/05/23 1025  BP: 131/74 123/77 129/71 136/75  Pulse: 83 84 82 89  Resp: (!) 21 20 20 18   Temp:    98.4 F (36.9 C)  TempSrc:      SpO2: 93% 95% 93% 94%  Weight:      Height:       Physical Exam Constitutional:      Appearance: She is well-developed.  Eyes:     Extraocular Movements: Extraocular movements intact.     Pupils: Pupils are equal, round, and reactive to light.  Cardiovascular:     Rate and Rhythm: Normal rate and regular rhythm.     Heart sounds: Normal heart sounds.  Pulmonary:     Effort: Pulmonary effort is normal.     Breath sounds: Normal breath sounds.  Abdominal:     Palpations: Abdomen is soft.  Musculoskeletal:        General: Normal range of motion.     Cervical back: Normal range of motion and neck supple.  Skin:    General: Skin is warm.  Neurological:     Mental Status: She is alert.      Family Communication: sister at bedside  Disposition: Status is: Inpatient Remains inpatient appropriate because: pain control, hypercalcimia and MM w/u  Planned Discharge Destination: Home with Home Health    Time spent: 35 minutes  Author: Enedina Finner, MD 02/05/2023 11:29 AM  For on call review [email or web address].

## 2023-02-05 NOTE — TOC Progression Note (Signed)
Transition of Care [facility]) - Progression Note    Patient Details  Name: Paula Garrett MRN: [id number] Date of Birth: [birth year]  Transition of [facility]) CM/SW Contact  Garret Reddish, RN Phone Number: 02/05/2023, 11:15 AM  Clinical Narrative:   Chart reviewed.  Noted that patient had BMBx right posterior ilium.  Patient being followed by Oncology.  Patient with increase pain past few days and Hypercalcemia.  Patient pain regimen being adjusted and patient also being treated for Hypercalcemia.  Patient remains on 02 1-2 liters.  Nursing team trying to wean.    TOC will continue to follow progress of patient.     Expected Discharge Plan: Home/Self Care Barriers to Discharge: Continued Medical Work up  Expected Discharge Plan and Services     Post Acute Care Choice: Durable Medical Equipment, Home Health Living arrangements for the past 2 months: Apartment                                       Social Determinants of Health (SDOH) Interventions SDOH Screenings   Food Insecurity: No Food Insecurity (01/24/2023)  Housing: Low Risk  (01/24/2023)  Transportation Needs: No Transportation Needs (01/24/2023)  Utilities: Not At Risk (01/24/2023)  Alcohol Screen: Low Risk  (01/01/2023)  Depression (PHQ2-9): High Risk (01/01/2023)  Financial Resource Strain: Low Risk  (11/01/2022)  Physical Activity: Insufficiently Active (11/01/2022)  Social Connections: Socially Isolated (11/01/2022)  Stress: Stress Concern Present (11/01/2022)  Tobacco Use: High Risk (01/24/2023)    Readmission Risk Interventions     No data to display

## 2023-02-06 ENCOUNTER — Other Ambulatory Visit: Payer: Self-pay | Admitting: *Deleted

## 2023-02-06 DIAGNOSIS — D4989 Neoplasm of unspecified behavior of other specified sites: Secondary | ICD-10-CM

## 2023-02-06 DIAGNOSIS — D472 Monoclonal gammopathy: Secondary | ICD-10-CM

## 2023-02-06 MED ORDER — GABAPENTIN 100 MG PO CAPS: 100.0000 mg | ORAL_CAPSULE | Freq: Three times a day (TID) | ORAL | 0 refills | Status: DC

## 2023-02-06 MED ORDER — OXYCODONE HCL 10 MG PO TABS: 10.0000 mg | ORAL_TABLET | ORAL | 0 refills | Status: DC | PRN

## 2023-02-06 MED ORDER — UMECLIDINIUM BROMIDE 62.5 MCG/ACT IN AEPB: 1.0000 | INHALATION_SPRAY | Freq: Every day | RESPIRATORY_TRACT | 0 refills | Status: DC

## 2023-02-06 MED ORDER — POLYETHYLENE GLYCOL 3350 17 G PO PACK: 17.0000 g | PACK | Freq: Two times a day (BID) | ORAL | 0 refills | Status: DC

## 2023-02-06 MED ORDER — ALBUTEROL SULFATE HFA 108 (90 BASE) MCG/ACT IN AERS
2.0000 | INHALATION_SPRAY | Freq: Four times a day (QID) | RESPIRATORY_TRACT | 11 refills | Status: DC | PRN
Start: 2023-02-06 — End: 2023-04-21

## 2023-02-06 MED ORDER — MOMETASONE FURO-FORMOTEROL FUM 200-5 MCG/ACT IN AERO: 2.0000 | INHALATION_SPRAY | Freq: Two times a day (BID) | RESPIRATORY_TRACT | 2 refills | Status: DC

## 2023-02-06 MED ORDER — OXYCODONE HCL ER 20 MG PO T12A
20.0000 mg | EXTENDED_RELEASE_TABLET | Freq: Two times a day (BID) | ORAL | 0 refills | Status: DC
Start: 2023-02-06 — End: 2023-02-21

## 2023-02-06 MED ORDER — DOCUSATE SODIUM 100 MG PO CAPS: 100.0000 mg | ORAL_CAPSULE | Freq: Two times a day (BID) | ORAL | 0 refills | Status: DC

## 2023-02-06 NOTE — Plan of Care (Signed)

## 2023-02-06 NOTE — Discharge Summary (Signed)
Physician Discharge Summary   Patient: Paula Garrett MRN: [id number] DOB: 22-Oct-1965  Admit date:     01/24/2023  Discharge date: 02/06/23  Discharge Physician: Enedina Finner   PCP: Smitty Cords, DO   Recommendations at discharge:   follow-up neurosurgery Dr. Ernestine Mcmurray on your scheduled appointment follow-up Dr. Smith Robert at the cancer center on your scheduled appointment follow-up your PCP in 1 to 2 weeks  Discharge Diagnoses: Principal Problem:   Hypercalcemia Active Problems:   Fluid overload   Acute hypoxic respiratory failure (HCC)   Mass of spinal cord (HCC)   Multiple myeloma (HCC)   Confusion   Drug induced constipation   Hyponatremia   AKI (acute kidney injury) (HCC)   Acute blood loss anemia   Benign essential hypertension   Barrett's esophagus without dysplasia   Obesity (BMI 30-39.9)   Anxiety   Hypothyroidism   Asthma   Insomnia   Tobacco use disorder   Rheumatoid factor positive   OSA (obstructive sleep apnea)   Postoperative hypothyroidism   Spinal stenosis, thoracic region   Palliative care encounter   Spinal instability of thoracic region   Paula Garrett is a 57 year old female with history of hypertension, depression, anxiety, history of tobacco use, GERD, who presents to the emergency department for chief concerns of chest pain and shortness of breath for 1 month.  Patient also has known multiple myeloma which has been followed as outpatient without requiring any treatment. Imaging study showed large soft tissue mass in left paraspinal soft tissue at T6-T7 with destruction of seventh rib and invasion of T6 and T7 vertebral bodies involving left posterior elements as well as epidural tumor and spinal canal with moderate spinal canal stenosis without frank cord compression or cord edema, acute/subacute compression fracture of T11 vertebral body with up to 30% vertebral body height and minimal bony retropulsion, possibly pathologic. Right occipital  calvarium with small enhancing lesion, suspicious for additional metastatic or myeloma lesion. Patient has been seen by neurosurgery, started on IV steroids.   Surgery 8/13: 1. Posterior Segmental Instrumentation T4 to T9; 2. Posterolateral arthrodesis from T4 to T9; 3. Thoracic Laminotomy at T6 and T7.   8/19.  Unable to come off oxygen today.  Patient able to walk on 100 feet today.  Calcium rose up to 12.7 and pamidronate ordered.  CT scan head negative for stroke but did show bone lesions consistent with multiple myeloma.  Cut back on pain medications secondary to altered mental status 8/20.  Patient having a lot of pain this morning.  Calcium up a little bit further.  Started calcitonin until pamidronate starts working.  Oncology increased oxycodone to 20 mg every 12 hours and we resumed low-dose gabapentin and increased oxycodone dose. 8/21--started seeing pt today. Just got back from her right hilum  BM bx per IR. Sister at bedside. Pt very tearful, emotional. Wants to go home. Lot of pain.    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  Last calcium 11.7 --  Received pamidronate on 8/19 on calcitonin  --pt will have repeat labs at the cancer center   Acute hypoxic respiratory failure (HCC) due to fluid overload/pulmonary edema --weaned to RA  --Chest x-ray concerning for fluid overload.  Hold Lasix.  Echocardiogram with a normal EF of 60%.  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  --s/p  Bone marrow biopsy on 8/21/24by IR --followed by dr Smith Robert --  Oncology increased oxycontin to 20 mg twice daily. And prn oxycodone --Placed back on low-dose gabapentin.   Mass of spinal cord [facility]) --Neurosurgical procedure done on 8/13 by Dr. Katrinka Blazing.   --Patient had a posterior segmental instrumentation T4-T9, posterior lateral arthrodesis from T4-T9, thoracic laminectomy  at T6 and T7.   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  --per Neurosurgery ok to remove dressing before discharge. Follow d/c instructions from them   Confusion --Likely hypercalcemia and narcotics related.  --  CT scan of the head negative --improved   Drug induced constipation --Continue medications for constipation--pt having BM   Acute blood loss anemia --Hemoglobin 8.9    AKI (acute kidney injury) (HCC) Creat down to 0.93   Hyponatremia --Sodium slowly rising    Benign essential hypertension --restart amlodipine   Barrett's esophagus without dysplasia Continue Protonix   Anxiety Continue Lexapro and clonazepam   Obesity (BMI 30-39.9) Last BMI 35.36   Asthma Prn inhalers   Hypothyroidism Continue levothyroxine.   Insomnia As needed Ambien   HHPT at discharge. D/w dr Smith Robert and dr Marcell Barlow and ok with d/c plans Pt and family agreeable to go home      Pain control - [facility] Controlled Substance Reporting System database was reviewed. and patient was instructed, not to drive, operate heavy machinery, perform activities at heights, swimming or participation in water activities or provide baby-sitting services while on Pain, Sleep and Anxiety Medications; until their outpatient Physician has advised to do so again. Also recommended to not to take more than prescribed Pain, Sleep and Anxiety Medications.  Consultants: neurosurgery, oncology Procedures performed: posterior segmental instrumentation T4-T9, posterior lateral arthrodesis from T4-T9, thoracic laminectomy at T6 and T7.    Disposition: Home health Diet recommendation:  Discharge Diet Orders (From admission, onward)     Start     Ordered   02/06/23 0000  Diet - low sodium heart healthy        02/06/23 1048           Cardiac diet DISCHARGE MEDICATION: Allergies as of 02/06/2023       Reactions   Hctz [hydrochlorothiazide]    Hyponatremia         Medication List     STOP  taking these medications    Breztri Aerosphere 160-9-4.8 MCG/ACT Aero Generic drug: Budeson-Glycopyrrol-Formoterol   montelukast 10 MG tablet Commonly known as: SINGULAIR   nicotine 21 mg/24hr patch Commonly known as: NICODERM CQ - dosed in mg/24 hours   nicotine polacrilex 2 MG lozenge Commonly known as: Nicotine Mini   ondansetron 8 MG tablet Commonly known as: ZOFRAN   predniSONE 20 MG tablet Commonly known as: DELTASONE       TAKE these medications    AeroChamber MV inhaler Use as instructed   albuterol 108 (90 Base) MCG/ACT inhaler Commonly known as: VENTOLIN HFA Inhale 2 puffs into the lungs every 6 (six) hours as needed for wheezing or shortness of breath.   amLODipine 10 MG tablet Commonly known as: NORVASC Take 1 tablet (10 mg total) by mouth daily.   cetirizine 10 MG tablet Commonly known as: ZYRTEC Take 1 tablet (10 mg total) by mouth daily.   clonazePAM 1 MG tablet Commonly known as: KLONOPIN Take 1 tablet (1 mg total) by mouth 2 (two) times daily.   cyanocobalamin 1000 MCG/ML injection Commonly known as: VITAMIN B12 INJECT 1 ML IN THE MUSCLE EVERY 30 DAYS   cyclobenzaprine 10 MG tablet Commonly known as: FLEXERIL Take 0.5-1  tablets (5-10 mg total) by mouth 3 (three) times daily as needed for muscle spasms.   docusate sodium 100 MG capsule Commonly known as: COLACE Take 1 capsule (100 mg total) by mouth 2 (two) times daily.   escitalopram 10 MG tablet Commonly known as: Lexapro Take 1 tablet (10 mg total) by mouth daily.   gabapentin 100 MG capsule Commonly known as: NEURONTIN Take 1 capsule (100 mg total) by mouth 3 (three) times daily.   levothyroxine 125 MCG tablet Commonly known as: SYNTHROID Take 250 mcg by mouth daily before breakfast.   mometasone-formoterol 200-5 MCG/ACT Aero Commonly known as: DULERA Inhale 2 puffs into the lungs 2 (two) times daily.   Oxycodone HCl 10 MG Tabs Take 1 tablet (10 mg total) by mouth every 4  (four) hours as needed for severe pain or moderate pain ((score 7 to 10)).   oxyCODONE 20 mg 12 hr tablet Commonly known as: OXYCONTIN Take 1 tablet (20 mg total) by mouth every 12 (twelve) hours.   pantoprazole 40 MG tablet Commonly known as: PROTONIX 30 min before lunch or dinner   polyethylene glycol 17 g packet Commonly known as: MIRALAX / GLYCOLAX Take 17 g by mouth 2 (two) times daily.   SYRINGE-NEEDLE (DISP) 3 ML 25G X 1-1/2" 3 ML Misc 1 Device by Does not apply route every 30 (thirty) days. With B12 shot   telmisartan 80 MG tablet Commonly known as: Micardis 1 whole pill qd goal BP <130/<80.   umeclidinium bromide 62.5 MCG/ACT Aepb Commonly known as: INCRUSE ELLIPTA Inhale 1 puff into the lungs daily. Start taking on: February 07, 2023   Vitamin D (Ergocalciferol) 1.25 MG (50000 UNIT) Caps capsule Commonly known as: DRISDOL Take 50,000 Units by mouth every 7 (seven) days.   zolpidem 10 MG tablet Commonly known as: AMBIEN Take 0.5-1 tablets (5-10 mg total) by mouth at bedtime as needed for sleep.               Durable Medical Equipment  (From admission, onward)           Start     Ordered   01/29/23 1316  For home use only DME Walker rolling  Once       Question Answer Comment  Walker: With 5 Inch Wheels   Patient needs a walker to treat with the following condition Mass of spinal cord (HCC)      01/29/23 1316            Follow-up Information     Drake Leach, PA-C Follow up on 02/10/2023.   Specialty: Neurosurgery Contact information: [address] [place] [address] [phone]         Smitty Cords, DO Follow up.   Specialty: Family Medicine Contact information: [address] [place] [address] [phone]         Creig Hines, MD. Schedule an appointment as soon as possible for a visit in 1 week(s).   Specialty: Oncology Why: f/u Multiple myeloma--Blood work and further out pt  eval Contact information: [address] [place] [address] [phone]                Discharge Exam: Ceasar Mons Weights   02/05/23 0438 02/05/23 0824 02/06/23 0323  Weight: 84.7 kg 84.7 kg 82.9 kg    Cardiovascular:     Rate and Rhythm: Normal rate and regular rhythm.     Heart sounds: Normal heart sounds.  Pulmonary:  Effort: Pulmonary effort is normal.     Breath sounds: Normal breath sounds.  Musculoskeletal:        General: Normal range of motion.     Cervical back: Normal range of motion and neck supple.  Surgical site int he back looks ok Skin:    General: Skin is warm.  Neurological:     Mental Status: She is alert   Condition at discharge: fair  The results of significant diagnostics from this hospitalization (including imaging, microbiology, ancillary and laboratory) are listed below for reference.   Imaging Studies: IR BONE MARROW BIOPSY & ASPIRATION  Result Date: 02/05/2023 INDICATION: Hematologic abnormality EXAM: Bone marrow aspiration and core biopsy using fluoroscopic guidance MEDICATIONS: None. ANESTHESIA/SEDATION: Moderate (conscious) sedation was employed during this procedure. A total of Versed 1.5 mg and Fentanyl 75 mcg was administered intravenously. Moderate Sedation Time: 10 minutes. The patient's level of consciousness and vital signs were monitored continuously by radiology nursing throughout the procedure under my direct supervision. FLUOROSCOPY TIME:  Fluoroscopy Time: 0.7 minutes (13 mGy) COMPLICATIONS: None immediate. PROCEDURE: Informed written consent was obtained from the patient after a thorough discussion of the procedural risks, benefits and alternatives. All questions were addressed. Maximal Sterile Barrier Technique was utilized including caps, mask, sterile gowns, sterile gloves, sterile drape, hand hygiene and skin antiseptic. A timeout was performed prior to the initiation of the procedure. The patient was placed prone on the  exam table. Limited fluoroscopy of the pelvis was performed for planning purposes. Skin entry site was marked, and the overlying skin was prepped and draped in the standard sterile fashion. Local analgesia was obtained with 1% lidocaine. Using fluoroscopic guidance, an 11 gauge needle was advanced just deep to the cortex of the right posterior ilium. Subsequently, bone marrow aspiration and core biopsy were performed. Specimens were submitted to lab/pathology for handling. Hemostasis was achieved with manual pressure, and a clean dressing was placed. The patient tolerated the procedure well without immediate complication. IMPRESSION: Successful bone marrow aspiration and core biopsy of the right posterior ilium. Electronically Signed   By: Olive Bass M.D.   On: 02/05/2023 15:35   CT HEAD WO CONTRAST ( )  Result Date: 02/03/2023 CLINICAL DATA:  Mental status change, unknown cause. Acute hypoxic respiratory failure EXAM: CT HEAD WITHOUT CONTRAST TECHNIQUE: Contiguous axial images were obtained from the base of the skull through the vertex without intravenous contrast. RADIATION DOSE REDUCTION: This exam was performed according to the departmental dose-optimization program which includes automated exposure control, adjustment of the mA and/or kV according to patient size and/or use of iterative reconstruction technique. COMPARISON:  Bone survey 10/09/2022. FINDINGS: Brain: No acute intracranial abnormality. Specifically, no hemorrhage, hydrocephalus, mass lesion, acute infarction, or significant intracranial injury. Vascular: No hyperdense vessel or unexpected calcification. Skull: Focal lucent lesions throughout the skull. 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 These lytic lesions were not in the skull on the skull x-ray from 10/09/2022. Appearance is concerning for possible multiple myeloma. No fracture. Sinuses/Orbits: No acute findings Other: None IMPRESSION: No acute  intracranial abnormality. Numerous scattered focal lucent lesions throughout the skull. These are new since prior bone survey from 10/09/2022 and concerning for multiple myeloma. Electronically Signed   By: Charlett Nose M.D.   On: 02/03/2023 16:30   DG Chest Port 1 View  Result Date: 02/01/2023 CLINICAL DATA:  Hypoxia EXAM: PORTABLE CHEST 1 VIEW COMPARISON:  01/30/2023 FINDINGS: Bilateral diffuse interstitial thickening and patchy alveolar airspace opacities at the  right lung base and left mid lower lung. No pleural effusion or pneumothorax. Stable cardiomediastinal silhouette. No acute osseous abnormality. Posterior spinal fusion of the upper and midthoracic spine. IMPRESSION: 1. Bilateral diffuse interstitial thickening and patchy alveolar airspace opacities at the right lung base and left mid lower lung concerning for pulmonary edema. Electronically Signed   By: Elige Ko M.D.   On: 02/01/2023 10:13   ECHOCARDIOGRAM COMPLETE  Result Date: 01/31/2023    ECHOCARDIOGRAM REPORT   Patient Name:   LALONNIE HIRN Date of Exam: 01/31/2023 Medical Rec #:  [id number]      Height:       62.0 in Accession #:    [id number]     Weight:       170.0 lb Date of Birth:  July 28, 1965      BSA:          1.784 m Patient Age:    57 years       BP:           106/57 mmHg Patient Gender: F              HR:           110 bpm. Exam Location:  [facility] Procedure: 2D Echo, Cardiac Doppler and Color Doppler Indications:     Dyspnea  History:         Patient has no prior history of Echocardiogram examinations.                  Signs/Symptoms:Dyspnea, Chest Pain, Fatigue and Shortness of                  Breath; Risk Factors:Hypertension, Sleep Apnea and Current                  Smoker.  Sonographer:     Mikki Harbor Referring Phys:  [id number] Alford Highland Diagnosing Phys: Julien Nordmann MD  Sonographer Comments: Image acquisition challenging due to respiratory motion. IMPRESSIONS  1. Left ventricular ejection fraction, by estimation, is  60 to 65%. The left ventricle has normal function. The left ventricle has no regional wall motion abnormalities. There is mild left ventricular hypertrophy. Left ventricular diastolic parameters are consistent with Grade I diastolic dysfunction (impaired relaxation).  2. Right ventricular systolic function is normal. The right ventricular size is normal. There is normal pulmonary artery systolic pressure. The estimated right ventricular systolic pressure is 30.9 mmHg.  3. Left atrial size was mildly dilated.  4. The mitral valve is normal in structure. No evidence of mitral valve regurgitation. No evidence of mitral stenosis.  5. The aortic valve is tricuspid. Aortic valve regurgitation is not visualized. No aortic stenosis is present.  6. The inferior vena cava is normal in size with greater than 50% respiratory variability, suggesting right atrial pressure of 3 mmHg. FINDINGS  Left Ventricle: Left ventricular ejection fraction, by estimation, is 60 to 65%. The left ventricle has normal function. The left ventricle has no regional wall motion abnormalities. The left ventricular internal cavity size was normal in size. There is  mild left ventricular hypertrophy. Left ventricular diastolic parameters are consistent with Grade I diastolic dysfunction (impaired relaxation). Right Ventricle: The right ventricular size is normal. No increase in right ventricular wall thickness. Right ventricular systolic function is normal. There is normal pulmonary artery systolic pressure. The tricuspid regurgitant velocity is 2.64 m/s, and  with an assumed right atrial pressure of 3 mmHg, the estimated right ventricular systolic pressure is 30.9  mmHg. Left Atrium: Left atrial size was mildly dilated. Right Atrium: Right atrial size was normal in size. Pericardium: There is no evidence of pericardial effusion. Mitral Valve: The mitral valve is normal in structure. No evidence of mitral valve regurgitation. No evidence of mitral valve  stenosis. MV peak gradient, 8.0 mmHg. The mean mitral valve gradient is 4.0 mmHg. Tricuspid Valve: The tricuspid valve is normal in structure. Tricuspid valve regurgitation is mild . No evidence of tricuspid stenosis. Aortic Valve: The aortic valve is tricuspid. Aortic valve regurgitation is not visualized. No aortic stenosis is present. Aortic valve mean gradient measures 9.0 mmHg. Aortic valve peak gradient measures 18.4 mmHg. Aortic valve area, by VTI measures 3.04  cm. Pulmonic Valve: The pulmonic valve was normal in structure. Pulmonic valve regurgitation is not visualized. No evidence of pulmonic stenosis. Aorta: The aortic root is normal in size and structure. Venous: The inferior vena cava is normal in size with greater than 50% respiratory variability, suggesting right atrial pressure of 3 mmHg. IAS/Shunts: No atrial level shunt detected by color flow Doppler.  LEFT VENTRICLE PLAX 2D LVIDd:         5.80 cm   Diastology LVIDs:         3.60 cm   LV e' medial:    9.36 cm/s LV PW:         0.90 cm   LV E/e' medial:  9.7 LV IVS:        1.00 cm   LV e' lateral:   10.00 cm/s LVOT diam:     2.20 cm   LV E/e' lateral: 9.1 LV SV:         99 LV SV Index:   55 LVOT Area:     3.80 cm  RIGHT VENTRICLE RV Basal diam:  3.45 cm RV Mid diam:    3.00 cm RV S prime:     27.40 cm/s TAPSE (M-mode): 3.3 cm LEFT ATRIUM             Index        RIGHT ATRIUM           Index LA diam:        4.80 cm 2.69 cm/m   RA Area:     18.90 cm LA Vol (A2C):   59.6 ml 33.41 ml/m  RA Volume:   54.10 ml  30.32 ml/m LA Vol (A4C):   66.9 ml 37.50 ml/m LA Biplane Vol: 65.0 ml 36.43 ml/m  AORTIC VALVE AV Area (Vmax):    3.09 cm AV Area (Vmean):   3.04 cm AV Area (VTI):     3.04 cm AV Vmax:           214.50 cm/s AV Vmean:          136.750 cm/s AV VTI:            0.326 m AV Peak Grad:      18.4 mmHg AV Mean Grad:      9.0 mmHg LVOT Vmax:         174.50 cm/s LVOT Vmean:        109.500 cm/s LVOT VTI:          0.260 m LVOT/AV VTI ratio: 0.80   AORTA Ao Root diam: 3.20 cm MITRAL VALVE                TRICUSPID VALVE MV Area (PHT): 3.72 cm     TR Peak grad:   27.9 mmHg MV Area VTI:  2.90 cm     TR Vmax:        264.00 cm/s MV Peak grad:  8.0 mmHg MV Mean grad:  4.0 mmHg     SHUNTS MV Vmax:       1.41 m/s     Systemic VTI:  0.26 m MV Vmean:      91.6 cm/s    Systemic Diam: 2.20 cm MV Decel Time: 204 msec MV E velocity: 90.70 cm/s MV A velocity: 117.00 cm/s MV E/A ratio:  0.78 Julien Nordmann MD Electronically signed by Julien Nordmann MD Signature Date/Time: 01/31/2023/5:43:31 PM    Final    DG Thoracic Spine 2 View  Result Date: 01/31/2023 CLINICAL DATA:  Back pain. 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 EXAM: THORACIC SPINE 2 VIEWS COMPARISON:  MRI of the cervicothoracic spine 01/24/2023. Intraoperative imaging of the thoracic spine 01/28/2023. Chest CTA 01/24/2023. 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 Counting superiorly from the T12 level, the patient has undergone midthoracic spinal decompression with posterior pedicle screw and rod fusion from T4 through T9. The hardware appears intact and well positioned. Chronic superior endplate compression deformity at T11 is unchanged. No acute osseous complications are identified. There is mild atelectasis at both lung bases. IMPRESSION: No demonstrated acute findings status post recent midthoracic spinal decompression and fusion. Electronically Signed   By: Carey Bullocks M.D.   On: 01/31/2023 16:27   DG Chest Port 1 View  Result Date: 01/30/2023 CLINICAL DATA:  Cough, shortness of breath EXAM: PORTABLE CHEST 1 VIEW COMPARISON:  01/24/2023 FINDINGS: Cardiomegaly. Diffuse bilateral interstitial pulmonary opacity and trace pleural effusions. Interval thoracic fusion. IMPRESSION: Cardiomegaly with diffuse bilateral interstitial pulmonary opacity and trace pleural effusions, most consistent with edema. Electronically  Signed   By: Jearld Lesch M.D.   On: 01/30/2023 14:07   DG Thoracic Spine 2 View  Result Date: 01/28/2023 CLINICAL DATA:  Elective surgery. T4 through T9 fusion of biopsy for lesion with intra 3D imaging. EXAM: THORACIC SPINE 2 VIEWS COMPARISON:  Preoperative imaging. FINDINGS: Intraoperative 3D imaging of the thoracic spine obtained. Posterior rod with pedicle screws at multiple levels. Known paravertebral mass better assessed on preoperative imaging. No fluoroscopic spot views provided. Fluoroscopy time 1 minutes 30 seconds. Dose is 97.045 mGy IMPRESSION: Intraoperative 3D imaging of the thoracic spine. Electronically Signed   By: Narda Rutherford M.D.   On: 01/28/2023 16:28   DG C-Arm 1-60 Min-No Report  Result Date: 01/28/2023 Fluoroscopy was utilized by the requesting physician.  No radiographic interpretation.   DG C-Arm 1-60 Min-No Report  Result Date: 01/28/2023 Fluoroscopy was utilized by the requesting physician.  No radiographic interpretation.   DG C-Arm 1-60 Min-No Report  Result Date: 01/28/2023 Fluoroscopy was utilized by the requesting physician.  No radiographic interpretation.   DG C-Arm 1-60 Min-No Report  Result Date: 01/28/2023 Fluoroscopy was utilized by the requesting physician.  No radiographic interpretation.   MR THORACIC SPINE W WO CONTRAST  Result Date: 01/24/2023 CLINICAL DATA:  Soft tissue mass with bony destruction involving the left seventh rib and T6 and T7 vertebral bodies. EXAM: MRI CERVICAL AND THORACIC SPINE WITHOUT AND WITH CONTRAST TECHNIQUE: Multiplanar and multiecho pulse sequences of the cervical spine, to include the craniocervical junction and cervicothoracic junction, and the thoracic spine, were obtained without and with intravenous contrast. CONTRAST:  7mL GADAVIST GADOBUTROL 1 MMOL/ML IV SOLN COMPARISON:  Same day CTA chest, cervical and thoracic spine MRI  07/20/2020 FINDINGS: MRI CERVICAL SPINE FINDINGS Alignment: There is trace retrolisthesis  of C3 on C4 and trace anterolisthesis of C5 on C6. Vertebrae: Vertebral body heights are preserved. Background marrow signal is normal. A T1 hypointense/T2 hyperintense lesion in the clivus is unchanged since 2022, favored benign. There is no suspicious marrow signal abnormality or marrow edema in the cervical spine. There is no abnormal marrow enhancement in the cervical spine. There is a 6 mm enhancing lesion in the right occipital calvarium which was not seen on the prior cervical spine MRI from 2022; however, this area may not has been included within the field of view. Cord: Normal in signal and morphology. Posterior Fossa, vertebral arteries, paraspinal tissues: The imaged posterior fossa is unremarkable. The vertebral artery flow voids are normal. The paraspinal soft tissues are unremarkable. Disc levels: There is overall mild degenerative change in the cervical spine without significant spinal canal or neural foraminal stenosis. MRI THORACIC SPINE FINDINGS Alignment:  Normal. Vertebrae: Background marrow signal is normal. There is infiltrative T1 hypointensity occupying most of the T6 and T7 vertebral bodies extending into the left pedicles and facets. There is a bulky soft tissue mass in the adjacent left paraspinal/subpleural soft tissues with destruction of the seventh rib. The bulk of the soft tissue component measures up to proximally 7.2 cm x 3.4 cm x 2.7 cm. 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 There is mild loss of vertebral body height at both levels consistent with associated pathologic fractures. There is compression deformity of the T11 vertebral body with up to approximately 30% loss of vertebral body height anteriorly and minimal bony retropulsion. There is  faint edema along the superior endplate consistent with acute to subacute chronicity. This fracture may be pathologic. There are probable additional lesions involving multiple additional ribs (for example 25-21, 25-24). The above findings are new since the MRI from 2022. Cord:  There is no cord signal abnormality or abnormal enhancement. Paraspinal and other soft tissues: Unremarkable, aside from the bulky paraspinal tumor described above. Disc levels: There is a small disc protrusion at T10-T11 without significant spinal canal or neural foraminal stenosis. Otherwise, there is overall minimal background degenerative change without significant spinal canal or neural foraminal stenosis. IMPRESSION: 1. Large soft tissue mass in the left paraspinal soft tissues at T6-T7 with destruction of the seventh rib and invasion of the T6 and T7 vertebral bodies with involvement of the left posterior elements as well as epidural tumor in the spinal canal resulting in moderate spinal canal stenosis without frank cord compression or cord edema. 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 2. Acute to subacute compression fracture of the T11 vertebral body with up to approximately 30% loss of vertebral body height and minimal bony retropulsion, possibly pathologic. 3. Small enhancing lesion in the right occipital calvarium, not definitely present on the prior cervical spine from 2022 (though possibly not included within the field of view on that study), suspicious for an additional metastatic or myeloma lesion. 4. Additional probable lesions involving multiple additional ribs. 5. No acute or suspicious finding in the cervical spine. Electronically Signed   By: Lesia Hausen M.D.   On: 01/24/2023 14:33   MR Cervical Spine W and Wo Contrast  Result Date:  01/24/2023 CLINICAL DATA:  Soft tissue mass with bony destruction involving the left seventh rib and T6 and T7 vertebral bodies.  EXAM: MRI CERVICAL AND THORACIC SPINE WITHOUT AND WITH CONTRAST TECHNIQUE: Multiplanar and multiecho pulse sequences of the cervical spine, to include the craniocervical junction and cervicothoracic junction, and the thoracic spine, were obtained without and with intravenous contrast. CONTRAST:  7mL GADAVIST GADOBUTROL 1 MMOL/ML IV SOLN COMPARISON:  Same day CTA chest, cervical and thoracic spine MRI 07/20/2020 FINDINGS: MRI CERVICAL SPINE FINDINGS Alignment: There is trace retrolisthesis of C3 on C4 and trace anterolisthesis of C5 on C6. Vertebrae: Vertebral body heights are preserved. Background marrow signal is normal. A T1 hypointense/T2 hyperintense lesion in the clivus is unchanged since 2022, favored benign. There is no suspicious marrow signal abnormality or marrow edema in the cervical spine. There is no abnormal marrow enhancement in the cervical spine. There is a 6 mm enhancing lesion in the right occipital calvarium which was not seen on the prior cervical spine MRI from 2022; however, this area may not has been included within the field of view. Cord: Normal in signal and morphology. Posterior Fossa, vertebral arteries, paraspinal tissues: The imaged posterior fossa is unremarkable. The vertebral artery flow voids are normal. The paraspinal soft tissues are unremarkable. Disc levels: There is overall mild degenerative change in the cervical spine without significant spinal canal or neural foraminal stenosis. MRI THORACIC SPINE FINDINGS Alignment:  Normal. Vertebrae: Background marrow signal is normal. There is infiltrative T1 hypointensity occupying most of the T6 and T7 vertebral bodies extending into the left pedicles and facets. There is a bulky soft tissue mass in the adjacent left paraspinal/subpleural soft tissues with destruction of the seventh rib. The bulk of the soft tissue component measures up to proximally 7.2 cm x 3.4 cm x 2.7 cm. There is extension into the T6-T7 and T7-T8 neural foramina  as well as epidural tumor along the ventral and left dorsal lateral aspects of the spinal canal resulting in moderate spinal canal stenosis with effacement of the thecal sac but no frank cord compression or cord edema. There is mild loss of vertebral body height at both levels consistent with associated pathologic fractures. There is compression deformity of the T11 vertebral body with up to approximately 30% loss of vertebral body height anteriorly and minimal bony retropulsion. There is faint edema along the superior endplate consistent with acute to subacute chronicity. This fracture may be pathologic. There are probable additional lesions involving multiple additional ribs (for example 25-21, 25-24). The above findings are new since the MRI from 2022. Cord:  There is no cord signal abnormality or abnormal enhancement. Paraspinal and other soft tissues: Unremarkable, aside from the bulky paraspinal tumor described above. Disc levels: There is a small disc protrusion at T10-T11 without significant spinal canal or neural foraminal stenosis. Otherwise, there is overall minimal background degenerative change without significant spinal canal or neural foraminal stenosis. IMPRESSION: 1. Large soft tissue mass in the left paraspinal soft tissues at T6-T7 with destruction of the seventh rib and invasion of the T6 and T7 vertebral bodies with involvement of the left posterior elements as well as epidural tumor in the spinal canal resulting in moderate spinal canal stenosis without frank cord compression or cord edema. 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 2. Acute to subacute compression fracture of the T11 vertebral body with up to approximately 30% loss of vertebral body height and minimal bony retropulsion, possibly pathologic. 3. Small enhancing  lesion in the right occipital calvarium, not definitely present on the prior cervical spine from 2022 (though  possibly not included within the field of view on that study), suspicious for an additional metastatic or myeloma lesion. 4. Additional probable lesions involving multiple additional ribs. 5. No acute or suspicious finding in the cervical spine. Electronically Signed   By: Lesia Hausen M.D.   On: 01/24/2023 14:33   CT Angio Chest PE W and/or Wo Contrast  Result Date: 01/24/2023 CLINICAL DATA:  Chest pain and shortness of breath for a month. EXAM: CT ANGIOGRAPHY CHEST WITH CONTRAST TECHNIQUE: Multidetector CT imaging of the chest was performed using the standard protocol during bolus administration of intravenous contrast. Multiplanar CT image reconstructions and MIPs were obtained to evaluate the vascular anatomy. RADIATION DOSE REDUCTION: This exam was performed according to the departmental dose-optimization program which includes automated exposure control, adjustment of the mA and/or kV according to patient size and/or use of iterative reconstruction technique. CONTRAST:  75mL OMNIPAQUE IOHEXOL 350 MG/ML SOLN COMPARISON:  X-ray 01/24/2023 and older.  Previous CT January 2017 FINDINGS: Cardiovascular: The thoracic aorta has a normal course and caliber with minimal calcified plaque. There is a bovine type aortic arch, normal variant. Coronary artery calcifications are seen. Please correlate for other coronary risk factors. Heart is nonenlarged. There is slight wall thickening of the left ventricle. Prominent fat along the intra-atrial septum. Pulmonary arteries show some slight enlargement centrally. Please correlate for any evidence of pulmonary artery hypertension. There is heterogeneous enhancement of the pulmonary arterial tree limiting evaluation for emboli. No obvious large and central embolus although several areas are nondiagnostic. Mediastinum/Nodes: Surgical changes identified with a moderate hiatal hernia. Small thyroid gland. No specific abnormal lymph node enlargement identified in the axillary  region, hilum or mediastinum. There are some small nodes identified in the posterior mediastinum, posterior to the descending thoracic aorta such as series 4, image 86 but not pathologic by size criteria. Lungs/Pleura: There is some linear opacity lung bases likely scar or atelectasis. No consolidation. Breathing motion identified. No pleural effusion or pneumothorax. 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 Benign hepatic cysts as well. Musculoskeletal: There is large destructive mass involving the posterior aspect of the left seventh rib with extension to involve the central canal of the spine in the associated vertebral bodies of T6 and T7. Cord compression is possible. This mass is estimated in diameter on series 4, image 65 at 8.5 by 4.3 cm. Slight extension along the extrapleural space in the posterior mediastinum as well. There is question of some subtle other lucent lesions identified along the spine but indeterminate. Critical Value/emergent results were called by telephone at the time of interpretation on 01/24/2023 at 8:41 am to provider [facility] , who verbally acknowledged these results. Review of the MIP images confirms the above findings. IMPRESSION: Aggressive soft tissue mass with bone destruction involving the left seventh rib as well as the T6 and T7 vertebral bodies and central canal. A neoplastic process is possible. There also is a possibility of significant cord compression with canal encroachment along the spine. Recommend further evaluation with spine MRI with and without contrast and a whole-body bone scan. There appears to be some other subtle lucent bone lesions along the spine and sternum. Please correlate for any history of known malignancy including such processes as myeloma or other. Poor opacification of the pulmonary arterial tree limiting evaluation. Several areas are non diagnostic for pulmonary emboli. No  obvious  large and central embolus. Aortic Atherosclerosis (ICD10-I70.0). Electronically Signed   By: Karen Kays M.D.   On: 01/24/2023 11:47   DG Chest 2 View  Result Date: 01/24/2023 CLINICAL DATA:  Chest pain EXAM: CHEST - 2 VIEW COMPARISON:  Chest x-ray dated January 07, 2023 FINDINGS: The heart size and mediastinal contours are within normal limits. Small hiatal hernia. Both lungs are clear. Pleural-based nodular opacity of the left posterior hemithorax. The visualized skeletal structures are unremarkable. IMPRESSION: Pleural-based nodular opacity of the left posterior hemithorax. Recommend further evaluation with contrast enhanced chest CT. Electronically Signed   By: Allegra Lai M.D.   On: 01/24/2023 09:38    Microbiology: Results for orders placed or performed in visit on 10/27/20  Microscopic Examination     Status: Abnormal   Collection Time: 10/27/20  8:31 AM   Urine  Result Value Ref Range Status   WBC, UA >30 (A) 0 - 5 /hpf Final   RBC, Urine None seen 0 - 2 /hpf Final   Epithelial Cells (non renal) 0-10 0 - 10 /hpf Final   Casts None seen None seen /lpf Final   Bacteria, UA Many (A) None seen/Few Final    Labs: CBC: Recent Labs  Lab 02/01/23 0409 02/02/23 0644 02/03/23 0315 02/04/23 0527 02/05/23 0550  WBC 7.0 8.6 8.9 11.9* 10.2  NEUTROABS  --   --   --   --  7.9*  HGB 7.7* 7.9* 7.9* 8.9* 9.0*  HCT 22.4* 23.1* 24.1* 26.5* 27.2*  MCV 93.7 94.7 96.8 95.3 94.4  PLT 236 284 339 397 458*   Basic Metabolic Panel: Recent Labs  Lab 02/01/23 0409 02/02/23 0644 02/03/23 0315 02/04/23 0527 02/04/23 1740 02/05/23 0550  NA 132* 132* 132* 132*  --  131*  K 4.0 4.5 4.9 4.8  --  4.3  CL 100 99 97* 96*  --  95*  CO2 24 24 27 29   --  28  GLUCOSE 88 105* 85 91  --  116*  BUN 32* 35* 32* 24*  --  16  CREATININE 1.04* 1.36* 1.17* 1.01*  --  0.93  CALCIUM 11.0* 12.1* 12.7* 13.8* 12.6* 11.7*  MG 1.6*  --   --   --   --   --     Discharge time spent: greater than 30  minutes.  Signed: Enedina Finner, MD [facility] 02/06/2023

## 2023-02-07 ENCOUNTER — Other Ambulatory Visit: Payer: Self-pay | Admitting: *Deleted

## 2023-02-07 ENCOUNTER — Telehealth: Payer: Self-pay | Admitting: *Deleted

## 2023-02-07 DIAGNOSIS — D472 Monoclonal gammopathy: Secondary | ICD-10-CM

## 2023-02-07 DIAGNOSIS — D4989 Neoplasm of unspecified behavior of other specified sites: Secondary | ICD-10-CM

## 2023-02-07 LAB — SURGICAL PATHOLOGY

## 2023-02-07 NOTE — Telephone Encounter (Signed)
Aundra Millet called the pt and let her know about the pet scan and the date and time and instructions for pet scan

## 2023-02-07 NOTE — Progress Notes (Deleted)
   REFERRING PHYSICIAN:  Kasandra Knudsen [address] [place],  [place] [address]  DOS: 01/28/23  T4-9 PSF, T6-7 laminotomy for tumor   HISTORY OF PRESENT ILLNESS: Paula Garrett is approximately 2 weeks status post T4-9 PSF, T6-7 laminotomy for tumor . Was given oxycontin 20mg  bid and oxocodone 5mg  along with neurontin on discharge from the hospital.   Biopsy showed plasma cell neoplasm.     PHYSICAL EXAMINATION:  General: Patient is well developed, well nourished, calm, collected, and in no apparent distress.   NEUROLOGICAL:  General: In no acute distress.   Awake, alert, oriented to person, place, and time.  Pupils equal round and reactive to light.  Facial tone is symmetric.     Strength:            Side Iliopsoas Quads Hamstring PF DF EHL  R 5 5 5 5 5 5   L 5 5 5 5 5 5    Incision c/d/i   ROS (Neurologic):  Negative except as noted above  IMAGING: Nothing new to review.   ASSESSMENT/PLAN:  ASHIANA STARLIN is doing well s/p above surgery. Treatment options reviewed with patient and following plan made:   - I have advised the patient to lift up to 10 pounds until 6 weeks after surgery (follow up with Dr. Katrinka Blazing).  - Reviewed wound care.  - No bending, twisting, or lifting.  - Continue on current medications including ***.  - Follow up as scheduled in 4 weeks and prn.   Advised to contact the office if any questions or concerns arise.  Drake Leach PA-C Department of neurosurgery

## 2023-02-10 ENCOUNTER — Other Ambulatory Visit: Payer: Self-pay

## 2023-02-10 ENCOUNTER — Inpatient Hospital Stay: Payer: Managed Care, Other (non HMO) | Admitting: Oncology

## 2023-02-10 ENCOUNTER — Inpatient Hospital Stay: Payer: Managed Care, Other (non HMO)

## 2023-02-10 ENCOUNTER — Encounter (HOSPITAL_COMMUNITY): Payer: Self-pay | Admitting: Oncology

## 2023-02-10 ENCOUNTER — Inpatient Hospital Stay: Payer: Managed Care, Other (non HMO) | Admitting: Pharmacist

## 2023-02-10 ENCOUNTER — Inpatient Hospital Stay
Admission: EM | Admit: 2023-02-10 | Discharge: 2023-02-21 | DRG: 840 | Disposition: A | Discharge status: Home / Self Care | Payer: Managed Care, Other (non HMO) | Source: Ambulatory Visit | Attending: Osteopathic Medicine | Admitting: Osteopathic Medicine

## 2023-02-10 ENCOUNTER — Inpatient Hospital Stay: Payer: Managed Care, Other (non HMO) | Attending: Oncology

## 2023-02-10 ENCOUNTER — Telehealth: Payer: Self-pay | Admitting: Neurosurgery

## 2023-02-10 ENCOUNTER — Encounter: Payer: Managed Care, Other (non HMO) | Admitting: Orthopedic Surgery

## 2023-02-10 ENCOUNTER — Telehealth: Payer: Self-pay

## 2023-02-10 ENCOUNTER — Encounter: Payer: Self-pay | Admitting: Oncology

## 2023-02-10 VITALS — BP 95/60 | HR 84 | Temp 97.8°F | Resp 18 | Ht 62.0 in | Wt 182.5 lb

## 2023-02-10 DIAGNOSIS — Z833 Family history of diabetes mellitus: Secondary | ICD-10-CM

## 2023-02-10 DIAGNOSIS — F419 Anxiety disorder, unspecified: Secondary | ICD-10-CM | POA: Diagnosis present

## 2023-02-10 DIAGNOSIS — I10 Essential (primary) hypertension: Secondary | ICD-10-CM | POA: Diagnosis present

## 2023-02-10 DIAGNOSIS — R0602 Shortness of breath: Secondary | ICD-10-CM | POA: Diagnosis not present

## 2023-02-10 DIAGNOSIS — E669 Obesity, unspecified: Secondary | ICD-10-CM | POA: Diagnosis present

## 2023-02-10 DIAGNOSIS — Z8616 Personal history of COVID-19: Secondary | ICD-10-CM | POA: Diagnosis not present

## 2023-02-10 DIAGNOSIS — Z8261 Family history of arthritis: Secondary | ICD-10-CM

## 2023-02-10 DIAGNOSIS — J440 Chronic obstructive pulmonary disease with acute lower respiratory infection: Secondary | ICD-10-CM | POA: Diagnosis not present

## 2023-02-10 DIAGNOSIS — N179 Acute kidney failure, unspecified: Secondary | ICD-10-CM

## 2023-02-10 DIAGNOSIS — D472 Monoclonal gammopathy: Secondary | ICD-10-CM | POA: Diagnosis not present

## 2023-02-10 DIAGNOSIS — J9601 Acute respiratory failure with hypoxia: Secondary | ICD-10-CM | POA: Diagnosis not present

## 2023-02-10 DIAGNOSIS — E785 Hyperlipidemia, unspecified: Secondary | ICD-10-CM | POA: Diagnosis present

## 2023-02-10 DIAGNOSIS — E8779 Other fluid overload: Secondary | ICD-10-CM | POA: Diagnosis not present

## 2023-02-10 DIAGNOSIS — Z1152 Encounter for screening for COVID-19: Secondary | ICD-10-CM

## 2023-02-10 DIAGNOSIS — E875 Hyperkalemia: Secondary | ICD-10-CM | POA: Diagnosis present

## 2023-02-10 DIAGNOSIS — F17210 Nicotine dependence, cigarettes, uncomplicated: Secondary | ICD-10-CM | POA: Diagnosis present

## 2023-02-10 DIAGNOSIS — Z9884 Bariatric surgery status: Secondary | ICD-10-CM

## 2023-02-10 DIAGNOSIS — E871 Hypo-osmolality and hyponatremia: Secondary | ICD-10-CM | POA: Diagnosis present

## 2023-02-10 DIAGNOSIS — D630 Anemia in neoplastic disease: Secondary | ICD-10-CM | POA: Diagnosis present

## 2023-02-10 DIAGNOSIS — E8721 Acute metabolic acidosis: Secondary | ICD-10-CM | POA: Diagnosis present

## 2023-02-10 DIAGNOSIS — Z6832 Body mass index (BMI) 32.0-32.9, adult: Secondary | ICD-10-CM

## 2023-02-10 DIAGNOSIS — Z7951 Long term (current) use of inhaled steroids: Secondary | ICD-10-CM

## 2023-02-10 DIAGNOSIS — Y848 Other medical procedures as the cause of abnormal reaction of the patient, or of later complication, without mention of misadventure at the time of the procedure: Secondary | ICD-10-CM | POA: Diagnosis not present

## 2023-02-10 DIAGNOSIS — R7989 Other specified abnormal findings of blood chemistry: Secondary | ICD-10-CM

## 2023-02-10 DIAGNOSIS — K5900 Constipation, unspecified: Secondary | ICD-10-CM | POA: Diagnosis not present

## 2023-02-10 DIAGNOSIS — I959 Hypotension, unspecified: Secondary | ICD-10-CM | POA: Diagnosis present

## 2023-02-10 DIAGNOSIS — M549 Dorsalgia, unspecified: Secondary | ICD-10-CM | POA: Diagnosis present

## 2023-02-10 DIAGNOSIS — Z23 Encounter for immunization: Secondary | ICD-10-CM | POA: Diagnosis not present

## 2023-02-10 DIAGNOSIS — G893 Neoplasm related pain (acute) (chronic): Secondary | ICD-10-CM | POA: Diagnosis present

## 2023-02-10 DIAGNOSIS — E890 Postprocedural hypothyroidism: Secondary | ICD-10-CM | POA: Diagnosis present

## 2023-02-10 DIAGNOSIS — Z79899 Other long term (current) drug therapy: Secondary | ICD-10-CM | POA: Diagnosis not present

## 2023-02-10 DIAGNOSIS — Z7982 Long term (current) use of aspirin: Secondary | ICD-10-CM

## 2023-02-10 DIAGNOSIS — Z825 Family history of asthma and other chronic lower respiratory diseases: Secondary | ICD-10-CM

## 2023-02-10 DIAGNOSIS — Z800 Family history of malignant neoplasm of digestive organs: Secondary | ICD-10-CM

## 2023-02-10 DIAGNOSIS — C9000 Multiple myeloma not having achieved remission: Secondary | ICD-10-CM | POA: Insufficient documentation

## 2023-02-10 DIAGNOSIS — T8089XA Other complications following infusion, transfusion and therapeutic injection, initial encounter: Secondary | ICD-10-CM | POA: Diagnosis not present

## 2023-02-10 DIAGNOSIS — I2510 Atherosclerotic heart disease of native coronary artery without angina pectoris: Secondary | ICD-10-CM | POA: Diagnosis present

## 2023-02-10 DIAGNOSIS — D509 Iron deficiency anemia, unspecified: Secondary | ICD-10-CM

## 2023-02-10 DIAGNOSIS — E790 Hyperuricemia without signs of inflammatory arthritis and tophaceous disease: Secondary | ICD-10-CM | POA: Diagnosis present

## 2023-02-10 DIAGNOSIS — J189 Pneumonia, unspecified organism: Secondary | ICD-10-CM | POA: Diagnosis not present

## 2023-02-10 DIAGNOSIS — Z515 Encounter for palliative care: Secondary | ICD-10-CM | POA: Diagnosis not present

## 2023-02-10 DIAGNOSIS — Z8269 Family history of other diseases of the musculoskeletal system and connective tissue: Secondary | ICD-10-CM

## 2023-02-10 DIAGNOSIS — Z888 Allergy status to other drugs, medicaments and biological substances status: Secondary | ICD-10-CM

## 2023-02-10 DIAGNOSIS — D649 Anemia, unspecified: Secondary | ICD-10-CM | POA: Diagnosis not present

## 2023-02-10 DIAGNOSIS — Z8249 Family history of ischemic heart disease and other diseases of the circulatory system: Secondary | ICD-10-CM

## 2023-02-10 DIAGNOSIS — Z818 Family history of other mental and behavioral disorders: Secondary | ICD-10-CM

## 2023-02-10 DIAGNOSIS — D72829 Elevated white blood cell count, unspecified: Secondary | ICD-10-CM | POA: Diagnosis present

## 2023-02-10 DIAGNOSIS — G8918 Other acute postprocedural pain: Secondary | ICD-10-CM | POA: Diagnosis present

## 2023-02-10 DIAGNOSIS — Z79890 Hormone replacement therapy (postmenopausal): Secondary | ICD-10-CM

## 2023-02-10 DIAGNOSIS — R339 Retention of urine, unspecified: Secondary | ICD-10-CM | POA: Diagnosis present

## 2023-02-10 DIAGNOSIS — F32A Depression, unspecified: Secondary | ICD-10-CM | POA: Diagnosis present

## 2023-02-10 DIAGNOSIS — K219 Gastro-esophageal reflux disease without esophagitis: Secondary | ICD-10-CM | POA: Diagnosis present

## 2023-02-10 LAB — CBC WITH DIFFERENTIAL/PLATELET
Abs Immature Granulocytes: 0.09 10*3/uL — ABNORMAL HIGH (ref 0.00–0.07)
Basophils Absolute: 0 10*3/uL (ref 0.0–0.1)
Basophils Relative: 0 %
Eosinophils Absolute: 0.1 10*3/uL (ref 0.0–0.5)
Eosinophils Relative: 1 %
HCT: 25.2 % — ABNORMAL LOW (ref 36.0–46.0)
Hemoglobin: 8.4 g/dL — ABNORMAL LOW (ref 12.0–15.0)
Immature Granulocytes: 1 %
Lymphocytes Relative: 19 %
Lymphs Abs: 2.7 10*3/uL (ref 0.7–4.0)
MCH: 31.5 pg (ref 26.0–34.0)
MCHC: 33.3 g/dL (ref 30.0–36.0)
MCV: 94.4 fL (ref 80.0–100.0)
Monocytes Absolute: 1 10*3/uL (ref 0.1–1.0)
Monocytes Relative: 7 %
Neutro Abs: 10.4 10*3/uL — ABNORMAL HIGH (ref 1.7–7.7)
Neutrophils Relative %: 72 %
Platelets: 439 10*3/uL — ABNORMAL HIGH (ref 150–400)
RBC: 2.67 MIL/uL — ABNORMAL LOW (ref 3.87–5.11)
RDW: 13.6 % (ref 11.5–15.5)
WBC: 14.3 10*3/uL — ABNORMAL HIGH (ref 4.0–10.5)
nRBC: 0 % (ref 0.0–0.2)

## 2023-02-10 LAB — BASIC METABOLIC PANEL
Anion gap: 11 (ref 5–15)
Anion gap: 11 (ref 5–15)
BUN: 58 mg/dL — ABNORMAL HIGH (ref 6–20)
BUN: 59 mg/dL — ABNORMAL HIGH (ref 6–20)
CO2: 21 mmol/L — ABNORMAL LOW (ref 22–32)
CO2: 19 mmol/L — ABNORMAL LOW (ref 22–32)
Calcium: 8.4 mg/dL — ABNORMAL LOW (ref 8.9–10.3)
Calcium: 7.8 mg/dL — ABNORMAL LOW (ref 8.9–10.3)
Chloride: 88 mmol/L — ABNORMAL LOW (ref 98–111)
Chloride: 95 mmol/L — ABNORMAL LOW (ref 98–111)
Creatinine, Ser: 5.27 mg/dL — ABNORMAL HIGH (ref 0.44–1.00)
Creatinine, Ser: 5.34 mg/dL — ABNORMAL HIGH (ref 0.44–1.00)
GFR, Estimated: 9 mL/min — ABNORMAL LOW (ref >60)
GFR, Estimated: 9 mL/min — ABNORMAL LOW (ref >60)
Glucose, Bld: 75 mg/dL (ref 70–99)
Glucose, Bld: 82 mg/dL (ref 70–99)
Potassium: 5.7 mmol/L — ABNORMAL HIGH (ref 3.5–5.1)
Potassium: 5.3 mmol/L — ABNORMAL HIGH (ref 3.5–5.1)
Sodium: 120 mmol/L — ABNORMAL LOW (ref 135–145)
Sodium: 125 mmol/L — ABNORMAL LOW (ref 135–145)

## 2023-02-10 LAB — COMPREHENSIVE METABOLIC PANEL
ALT: 16 U/L (ref 0–44)
AST: 18 U/L (ref 15–41)
Albumin: 2.1 g/dL — ABNORMAL LOW (ref 3.5–5.0)
Alkaline Phosphatase: 60 U/L (ref 38–126)
Anion gap: 12 (ref 5–15)
BUN: 54 mg/dL — ABNORMAL HIGH (ref 6–20)
CO2: 19 mmol/L — ABNORMAL LOW (ref 22–32)
Calcium: 8.3 mg/dL — ABNORMAL LOW (ref 8.9–10.3)
Chloride: 89 mmol/L — ABNORMAL LOW (ref 98–111)
Creatinine, Ser: 5.3 mg/dL — ABNORMAL HIGH (ref 0.44–1.00)
GFR, Estimated: 9 mL/min — ABNORMAL LOW (ref >60)
Glucose, Bld: 93 mg/dL (ref 70–99)
Potassium: 5 mmol/L (ref 3.5–5.1)
Sodium: 120 mmol/L — ABNORMAL LOW (ref 135–145)
Total Bilirubin: 0.3 mg/dL (ref 0.3–1.2)
Total Protein: 8.2 g/dL — ABNORMAL HIGH (ref 6.5–8.1)

## 2023-02-10 LAB — IRON AND TIBC
Iron: 33 ug/dL (ref 28–170)
Saturation Ratios: 13 % (ref 10.4–31.8)
TIBC: 258 ug/dL (ref 250–450)
UIBC: 225 ug/dL

## 2023-02-10 LAB — PHOSPHORUS: Phosphorus: 6.4 mg/dL — ABNORMAL HIGH (ref 2.5–4.6)

## 2023-02-10 LAB — CK: Total CK: 119 U/L (ref 38–234)

## 2023-02-10 LAB — FERRITIN: Ferritin: 142 ng/mL (ref 11–307)

## 2023-02-10 LAB — URIC ACID: Uric Acid, Serum: 7.5 mg/dL — ABNORMAL HIGH (ref 2.5–7.1)

## 2023-02-10 LAB — OSMOLALITY: Osmolality: 278 mOsm/kg (ref 275–295)

## 2023-02-10 LAB — SURGICAL PCR SCREEN
MRSA, PCR: NEGATIVE
Staphylococcus aureus: NEGATIVE

## 2023-02-10 LAB — SODIUM, URINE, RANDOM: Sodium, Ur: 35 mmol/L

## 2023-02-10 LAB — HIV ANTIBODY (ROUTINE TESTING W REFLEX): HIV Screen 4th Generation wRfx: NONREACTIVE

## 2023-02-10 LAB — LACTATE DEHYDROGENASE: LDH: 118 U/L (ref 98–192)

## 2023-02-10 LAB — SODIUM: Sodium: 122 mmol/L — ABNORMAL LOW (ref 135–145)

## 2023-02-10 LAB — OSMOLALITY, URINE: Osmolality, Ur: 163 mOsm/kg — ABNORMAL LOW (ref 300–900)

## 2023-02-10 MED ORDER — ZOLPIDEM TARTRATE 5 MG PO TABS
5.0000 mg | ORAL_TABLET | Freq: Every evening | ORAL | Status: DC | PRN
  Administered 2023-02-14 – 2023-02-20 (×3): 10 mg via ORAL
  Filled 2023-02-10 (×4): qty 2

## 2023-02-10 MED ORDER — SODIUM CHLORIDE 0.9 % IV BOLUS
1000.0000 mL | Freq: Once | INTRAVENOUS | Status: AC
  Administered 2023-02-10: 1000 mL via INTRAVENOUS

## 2023-02-10 MED ORDER — SODIUM CHLORIDE 0.9 % IV SOLN: INTRAVENOUS | Status: DC

## 2023-02-10 MED ORDER — HYDROMORPHONE HCL 1 MG/ML IJ SOLN
0.5000 mg | INTRAMUSCULAR | Status: DC | PRN
  Administered 2023-02-10 – 2023-02-12 (×7): 1 mg via INTRAVENOUS
  Filled 2023-02-10 (×7): qty 1

## 2023-02-10 MED ORDER — SODIUM CHLORIDE 1 G PO TABS
1.0000 g | ORAL_TABLET | Freq: Once | ORAL | Status: AC
  Administered 2023-02-10: 1 g via ORAL
  Filled 2023-02-10: qty 1

## 2023-02-10 MED ORDER — INSULIN ASPART 100 UNIT/ML IV SOLN
10.0000 [IU] | Freq: Once | INTRAVENOUS | Status: AC
  Administered 2023-02-10: 10 [IU] via INTRAVENOUS
  Filled 2023-02-10 (×2): qty 0.1

## 2023-02-10 MED ORDER — DOCUSATE SODIUM 100 MG PO CAPS
100.0000 mg | ORAL_CAPSULE | Freq: Two times a day (BID) | ORAL | Status: DC
  Administered 2023-02-10 – 2023-02-21 (×18): 100 mg via ORAL
  Filled 2023-02-10 (×22): qty 1

## 2023-02-10 MED ORDER — CLONAZEPAM 0.5 MG PO TABS
0.5000 mg | ORAL_TABLET | Freq: Two times a day (BID) | ORAL | Status: DC
  Administered 2023-02-10 – 2023-02-17 (×14): 0.5 mg via ORAL
  Filled 2023-02-10 (×14): qty 1

## 2023-02-10 MED ORDER — CYCLOBENZAPRINE HCL 10 MG PO TABS
5.0000 mg | ORAL_TABLET | Freq: Three times a day (TID) | ORAL | Status: DC | PRN
  Administered 2023-02-13 – 2023-02-20 (×8): 10 mg via ORAL
  Filled 2023-02-10 (×8): qty 1

## 2023-02-10 MED ORDER — SODIUM ZIRCONIUM CYCLOSILICATE 10 G PO PACK
10.0000 g | PACK | Freq: Once | ORAL | Status: AC
  Administered 2023-02-10: 10 g via ORAL
  Filled 2023-02-10 (×2): qty 1

## 2023-02-10 MED ORDER — POLYETHYLENE GLYCOL 3350 17 G PO PACK
17.0000 g | PACK | Freq: Two times a day (BID) | ORAL | Status: DC
  Administered 2023-02-10 – 2023-02-11 (×2): 17 g via ORAL
  Filled 2023-02-10 (×2): qty 1

## 2023-02-10 MED ORDER — LORATADINE 10 MG PO TABS
10.0000 mg | ORAL_TABLET | Freq: Every day | ORAL | Status: DC
  Administered 2023-02-10 – 2023-02-21 (×12): 10 mg via ORAL
  Filled 2023-02-10 (×12): qty 1

## 2023-02-10 MED ORDER — LEVOTHYROXINE SODIUM 50 MCG PO TABS
250.0000 ug | ORAL_TABLET | Freq: Every day | ORAL | Status: DC
  Administered 2023-02-11 – 2023-02-21 (×11): 250 ug via ORAL
  Filled 2023-02-10 (×11): qty 1

## 2023-02-10 MED ORDER — SEVELAMER CARBONATE 800 MG PO TABS
800.0000 mg | ORAL_TABLET | Freq: Three times a day (TID) | ORAL | Status: DC
  Administered 2023-02-10 – 2023-02-11 (×2): 800 mg via ORAL
  Filled 2023-02-10 (×2): qty 1

## 2023-02-10 MED ORDER — MOMETASONE FURO-FORMOTEROL FUM 200-5 MCG/ACT IN AERO
2.0000 | INHALATION_SPRAY | Freq: Two times a day (BID) | RESPIRATORY_TRACT | Status: DC
  Administered 2023-02-11 – 2023-02-21 (×21): 2 via RESPIRATORY_TRACT
  Filled 2023-02-10 (×2): qty 8.8

## 2023-02-10 MED ORDER — GABAPENTIN 100 MG PO CAPS: 100.0000 mg | ORAL_CAPSULE | Freq: Three times a day (TID) | ORAL | Status: DC

## 2023-02-10 MED ORDER — UMECLIDINIUM BROMIDE 62.5 MCG/ACT IN AEPB
1.0000 | INHALATION_SPRAY | Freq: Every day | RESPIRATORY_TRACT | Status: DC
  Administered 2023-02-11 – 2023-02-21 (×11): 1 via RESPIRATORY_TRACT
  Filled 2023-02-10 (×3): qty 7

## 2023-02-10 MED ORDER — ESCITALOPRAM OXALATE 10 MG PO TABS
10.0000 mg | ORAL_TABLET | Freq: Every day | ORAL | Status: DC
  Administered 2023-02-10 – 2023-02-21 (×12): 10 mg via ORAL
  Filled 2023-02-10 (×12): qty 1

## 2023-02-10 MED ORDER — ALBUTEROL SULFATE (2.5 MG/3ML) 0.083% IN NEBU
3.0000 mL | INHALATION_SOLUTION | Freq: Four times a day (QID) | RESPIRATORY_TRACT | Status: DC | PRN
  Administered 2023-02-16 – 2023-02-20 (×5): 3 mL via RESPIRATORY_TRACT
  Filled 2023-02-10 (×4): qty 3

## 2023-02-10 MED ORDER — GABAPENTIN 100 MG PO CAPS
100.0000 mg | ORAL_CAPSULE | Freq: Two times a day (BID) | ORAL | Status: DC
  Administered 2023-02-10 – 2023-02-21 (×22): 100 mg via ORAL
  Filled 2023-02-10 (×22): qty 1

## 2023-02-10 MED ORDER — DEXTROSE 50 % IV SOLN
1.0000 | Freq: Once | INTRAVENOUS | Status: AC
  Administered 2023-02-10: 50 mL via INTRAVENOUS
  Filled 2023-02-10: qty 50

## 2023-02-10 MED ORDER — FENTANYL CITRATE PF 50 MCG/ML IJ SOSY
50.0000 ug | PREFILLED_SYRINGE | Freq: Once | INTRAMUSCULAR | Status: AC
  Administered 2023-02-10: 50 ug via INTRAVENOUS
  Filled 2023-02-10: qty 1

## 2023-02-10 MED ORDER — HYDRALAZINE HCL 20 MG/ML IJ SOLN: 5.0000 mg | Freq: Four times a day (QID) | INTRAMUSCULAR | Status: DC | PRN

## 2023-02-10 MED ORDER — PANTOPRAZOLE SODIUM 40 MG PO TBEC
40.0000 mg | DELAYED_RELEASE_TABLET | Freq: Every day | ORAL | Status: DC
  Administered 2023-02-10 – 2023-02-21 (×12): 40 mg via ORAL
  Filled 2023-02-10 (×12): qty 1

## 2023-02-10 MED ORDER — HEPARIN SODIUM (PORCINE) 5000 UNIT/ML IJ SOLN
5000.0000 [IU] | Freq: Two times a day (BID) | INTRAMUSCULAR | Status: DC
  Administered 2023-02-10 – 2023-02-17 (×14): 5000 [IU] via SUBCUTANEOUS
  Filled 2023-02-10 (×14): qty 1

## 2023-02-10 MED ORDER — MUPIROCIN 2 % EX OINT
1.0000 | TOPICAL_OINTMENT | Freq: Two times a day (BID) | CUTANEOUS | Status: AC
  Administered 2023-02-10 – 2023-02-15 (×9): 1 via NASAL
  Filled 2023-02-10: qty 22

## 2023-02-10 MED ORDER — DEXAMETHASONE 4 MG PO TABS
40.0000 mg | ORAL_TABLET | Freq: Every day | ORAL | Status: AC
  Administered 2023-02-11 – 2023-02-14 (×4): 40 mg via ORAL
  Filled 2023-02-10 (×4): qty 10

## 2023-02-10 NOTE — ED Notes (Signed)
429 urine found on bladder scan.

## 2023-02-10 NOTE — ED Provider Notes (Signed)
[facility] Provider Note    Event Date/Time   First MD Initiated Contact with Patient 02/10/23 1224     (approximate)   History   abnormal labs   HPI {Remember to add pertinent medical, surgical, social, and/or OB history to HPI:1} Paula Garrett is a 57 y.o. female  ***       Physical Exam   Triage Vital Signs: ED Triage Vitals  Encounter Vitals Group     BP 02/10/23 1210 117/69     Systolic BP Percentile --      Diastolic BP Percentile --      Pulse Rate 02/10/23 1210 88     Resp 02/10/23 1210 18     Temp 02/10/23 1210 98.1 F (36.7 C)     Temp src --      SpO2 02/10/23 1210 97 %     Weight --      Height --      Head Circumference --      Peak Flow --      Pain Score 02/10/23 1211 5     Pain Loc --      Pain Education --      Exclude from Growth Chart --     Most recent vital signs: Vitals:   02/10/23 1210 02/10/23 1327  BP: 117/69 98/60  Pulse: 88 74  Resp: 18 15  Temp: 98.1 F (36.7 C)   SpO2: 97% (!) 89%    {Only need to document appropriate and relevant physical exam:1} General: Awake, no distress. *** CV:  Good peripheral perfusion. *** Resp:  Normal effort. *** Abd:  No distention. *** Other:  ***   ED Results / Procedures / Treatments   Labs (all labs ordered are listed, but only abnormal results are displayed) Labs Reviewed  PHOSPHORUS  URIC ACID  LACTATE DEHYDROGENASE     EKG  ***   RADIOLOGY *** {USE THE WORD "INTERPRETED"!! You MUST document your own interpretation of imaging, as well as the fact that you reviewed the radiologist's report!:1}   PROCEDURES:  Critical Care performed: Yes  CRITICAL CARE Performed by: Phineas Semen   Total critical care time: *** minutes  Critical care time was exclusive of separately billable procedures and treating other patients.  Critical care was necessary to treat or prevent imminent or life-threatening deterioration.  Critical care was time  spent personally by me on the following activities: development of treatment plan with patient and/or surrogate as well as nursing, discussions with consultants, evaluation of patient's response to treatment, examination of patient, obtaining history from patient or surrogate, ordering and performing treatments and interventions, ordering and review of laboratory studies, ordering and review of radiographic studies, pulse oximetry and re-evaluation of patient's condition.   Procedures    MEDICATIONS ORDERED IN ED: Medications  0.9 %  sodium chloride infusion (has no administration in time range)  sodium chloride 0.9 % bolus 1,000 mL (0 mLs Intravenous Stopped 02/10/23 1320)  sodium chloride 0.9 % bolus 1,000 mL (1,000 mLs Intravenous New Bag/Given 02/10/23 1327)     IMPRESSION / MDM / ASSESSMENT AND PLAN / ED COURSE  I reviewed the triage vital signs and the nursing notes.                              Differential diagnosis includes, but is not limited to, ***  Patient's presentation is most consistent with {EM COPA:27473}   ***  The patient is on the cardiac monitor to evaluate for evidence of arrhythmia and/or significant heart rate changes.  ***      FINAL CLINICAL IMPRESSION(S) / ED DIAGNOSES   Final diagnoses:  None     Rx / DC Orders   ED Discharge Orders     None        Note:  This document was prepared using Dragon voice recognition software and may include unintentional dictation errors.

## 2023-02-10 NOTE — ED Notes (Signed)
Pt resting. No needs at this time.

## 2023-02-10 NOTE — ED Triage Notes (Signed)
Pt to ED sent by PCP for abnormal labs, checked twice this am. Na+ 120, creatinine elevated to 5.27 Pt denies complaints. Alert and oriented

## 2023-02-10 NOTE — Progress Notes (Signed)
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  She was last seen by me in April 2024 and at that time she had complained of mild left chest wall pain radiating to her back.  She underwent cardiac workup and I had done a bone survey at that time which did not show any evidence of bone disease.  Her smoldering multiple myeloma at that time was stable.  Subsequently she has had chest x-ray in May 2024 as well as a rib x-ray in July 2024.  None of these showed any abnormalities.  Her pain has gradually worsened in the last 2 months but especially so in the last 1 week.  She presented to the ER with symptoms of pleuritic chest wall pain and shortness of breath and underwent a CT angio chest which showed a soft tissue mass with bone destruction involving the left seventh rib as well as T6 and T7 vertebral bodies and central canal.  Possibility of cord compression.  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  Results of myeloma work-up from 06/20/2020 showed an elevated IgG  of 3668.  M protein was elevated at 3.1 and IFE showed IgG monoclonal lambda protein.  Beta-2 microglobulin and LDH were normal.  Serum free light chain ratio was elevated at 25 with a free light chain lambda of 303.  Bone marrow biopsy showed increased plasma cells 22% by manual count and 20% by IHC staining for CD138.  Cytogenetics for myeloma showed gain of 1 q.  PET CT scan was not approved by insurance.  MRI cervical and lumbar thoracic spine did not show any evidence of lytic lesions.  Bone survey was negative for bone lesions as well.  MRI pelvis with and without contrast showed diffuse patchy heterogeneous marrow concerning for myeloma   Patient had a repeat bone marrow biopsy inAugust 2024 which showed 20% overall plasma cells and hypercellular marrow consistent with plasma cell myeloma.  Cytogenetics and FISH studies for multiple myeloma pending.  PET scan pending.  Patient had stabilization procedure/spinal fusion T4-T9 with T6-T7 laminotomies.  Spinal mass biopsy was consistent with plasma cell neoplasm    Interval history-patient states that she is doing well overall and pain is well-controlled with oxycodone and long-acting OxyContin but her insurance is not covering OxyContin.  Denies any constipation.  ECOG PS- 1 Pain scale- 3 Opioid associated constipation- no  Review of systems- Review of Systems  Constitutional:  Positive for malaise/fatigue. Negative for chills, fever and weight loss.  HENT:  Negative for congestion, ear discharge and nosebleeds.   Eyes:  Negative for blurred vision.  Respiratory:  Negative for cough, hemoptysis,  sputum production, shortness of breath and wheezing.   Cardiovascular:  Negative for chest pain, palpitations, orthopnea and claudication.  Gastrointestinal:  Negative for abdominal pain, blood in stool, constipation, diarrhea, heartburn, melena, nausea and vomiting.  Genitourinary:  Negative for dysuria, flank pain, frequency, hematuria and urgency.   Musculoskeletal:  Positive for back pain. Negative for joint pain and myalgias.  Skin:  Negative for rash.  Neurological:  Negative for dizziness, tingling, focal weakness, seizures, weakness and headaches.  Endo/Heme/Allergies:  Does not bruise/bleed easily.  Psychiatric/Behavioral:  Negative for depression and suicidal ideas. The patient does not have insomnia.       Allergies  Allergen Reactions   Hctz [Hydrochlorothiazide]     Hyponatremia      Past Medical History:  Diagnosis Date   Anxiety    Arthritis    Asthma    Barrett's esophagus 2015   COVID-19    03/10/20   Depression    GERD (gastroesophageal reflux disease)    Hypertension    Hypothyroidism    Pneumonia    Smoldering myeloma    Thyroid disease      Past Surgical History:  Procedure Laterality Date   ABLATION  2006   APPLICATION OF INTRAOPERATIVE CT SCAN N/A 01/28/2023   Procedure: APPLICATION OF INTRAOPERATIVE CT SCAN;  Surgeon: Loreen Freud, MD;  Location: [facility] ORS;  Service: Neurosurgery;  Laterality: N/A;   BREAST CYST ASPIRATION Left 1998   BREAST SURGERY     cyst aspiration   BREAST SURGERY  1998   cyst aspiration   COLONOSCOPY  03/2014   COLONOSCOPY WITH PROPOFOL N/A 01/06/2020   Procedure: COLONOSCOPY WITH PROPOFOL;  Surgeon: Toledo, Boykin Nearing, MD;  Location: [facility] ENDOSCOPY;  Service: Gastroenterology;  Laterality: N/A;   ENDOMETRIAL ABLATION     ESOPHAGOGASTRODUODENOSCOPY (EGD) WITH PROPOFOL N/A 08/15/2015   Procedure: ESOPHAGOGASTRODUODENOSCOPY (EGD) WITH PROPOFOL;  Surgeon: Christena Deem, MD;  Location: [facility] ENDOSCOPY;  Service: Endoscopy;  Laterality: N/A;   ESOPHAGOGASTRODUODENOSCOPY (EGD) WITH PROPOFOL N/A 01/22/2016   Procedure: ESOPHAGOGASTRODUODENOSCOPY (EGD) WITH PROPOFOL;  Surgeon: Christena Deem, MD;  Location: [facility] ENDOSCOPY;  Service: Endoscopy;  Laterality: N/A;   ESOPHAGOGASTRODUODENOSCOPY (EGD) WITH PROPOFOL N/A 01/06/2020   Procedure: ESOPHAGOGASTRODUODENOSCOPY  (EGD) WITH PROPOFOL;  Surgeon: Toledo, Boykin Nearing, MD;  Location: [facility] ENDOSCOPY;  Service: Gastroenterology;  Laterality: N/A;   GASTRIC BYPASS  2005   HERNIA REPAIR     IR BONE MARROW BIOPSY & ASPIRATION  02/05/2023   NASAL SINUS SURGERY     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   TOTAL THYROIDECTOMY     TUBAL LIGATION      Social History   Socioeconomic History   Marital status: Divorced    Spouse name: Not on file   Number of children: 2   Years of education: Not on file   Highest education level: GED or equivalent  Occupational History   Occupation: Part-time  Tobacco Use   Smoking status: Every Day    Current packs/day: 1.00    Average packs/day: 1 pack/day for 42.9 years (42.9 ttl pk-yrs)    Types: Cigarettes    Start date: 03/15/1980   Smokeless tobacco: Never  Vaping Use   Vaping status: Never Used  Substance and Sexual Activity   Alcohol use: Yes    Alcohol/week: 10.0 - 12.0 standard drinks of alcohol    Types: 8 - 10 Glasses of wine, 2 Standard drinks or equivalent per week   Drug use: No  Sexual activity: Yes    Partners: Male  Other Topics Concern   Not on file  Social History Narrative   Lives in [place] single, divorced       Work - 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       Diet - healthy diet   Exercise - limited   Caffeine use: daily   Social Determinants of Health   Financial Resource Strain: Low Risk  (11/01/2022)   Overall Financial Resource Strain (CARDIA)    Difficulty of Paying Living Expenses: Not very hard  Food Insecurity: No Food Insecurity (01/24/2023)   Hunger Vital Sign    Worried About Running Out of Food in the Last Year: Never true    Ran Out of Food in the Last Year: Never true  Transportation Needs: No Transportation Needs (01/24/2023)   PRAPARE - Administrator, Civil Service (Medical): No    Lack of Transportation (Non-Medical): No  Physical Activity: Insufficiently Active (11/01/2022)   Exercise Vital Sign     Days of Exercise per Week: 3 days    Minutes of Exercise per Session: 20 min  Stress: Stress Concern Present (11/01/2022)   [employer] of Occupational Health - Occupational Stress Questionnaire    Feeling of Stress : Very much  Social Connections: Socially Isolated (11/01/2022)   Social Connection and Isolation Panel [NHANES]    Frequency of Communication with Friends and Family: More than three times a week    Frequency of Social Gatherings with Friends and Family: Once a week    Attends Religious Services: Never    Database administrator or Organizations: No    Attends Engineer, structural: Not on file    Marital Status: Divorced  Intimate Partner Violence: Not At Risk (01/24/2023)   Humiliation, Afraid, Rape, and Kick questionnaire    Fear of Current or Ex-Partner: No    Emotionally Abused: No    Physically Abused: No    Sexually Abused: No    Family History  Problem Relation Age of Onset   Heart disease Mother    COPD Mother    Arthritis Mother    Cancer Mother        uterine   Lupus Mother    Anxiety disorder Mother    Depression Mother    Drug abuse Mother    COPD Father    Arthritis Father    Heart disease Father    Heart attack Father    Alcohol abuse Father    Heart disease Brother    Heart attack Brother    Drug abuse Brother    Anxiety disorder Brother    Depression Brother    Heart disease Brother    Cancer Brother        liver cancer age 35 y.o   Diabetes Maternal Grandmother    Diabetes Maternal Grandfather    Diabetes Paternal Grandmother    Diabetes Paternal Grandfather    Breast cancer Neg Hx     No current facility-administered medications for this visit.  Current Outpatient Medications:    albuterol (VENTOLIN HFA) 108 (90 Base) MCG/ACT inhaler, Inhale 2 puffs into the lungs every 6 (six) hours as needed for wheezing or shortness of breath., Disp: 18 g, Rfl: 11   amLODipine (NORVASC) 10 MG tablet, Take 1 tablet (10 mg total) by  mouth daily., Disp: 90 tablet, Rfl: 3   cetirizine (ZYRTEC) 10 MG tablet, Take 1 tablet (10 mg total) by mouth daily., Disp:  90 tablet, Rfl: 3   clonazePAM (KLONOPIN) 1 MG tablet, Take 1 tablet (1 mg total) by mouth 2 (two) times daily., Disp: 60 tablet, Rfl: 2   cyanocobalamin (VITAMIN B12) 1000 MCG/ML injection, INJECT 1 ML IN THE MUSCLE EVERY 30 DAYS, Disp: 4 mL, Rfl: 12   cyclobenzaprine (FLEXERIL) 10 MG tablet, Take 0.5-1 tablets (5-10 mg total) by mouth 3 (three) times daily as needed for muscle spasms., Disp: 60 tablet, Rfl: 1   docusate sodium (COLACE) 100 MG capsule, Take 1 capsule (100 mg total) by mouth 2 (two) times daily., Disp: 10 capsule, Rfl: 0   escitalopram (LEXAPRO) 10 MG tablet, Take 1 tablet (10 mg total) by mouth daily., Disp: 30 tablet, Rfl: 2   gabapentin (NEURONTIN) 100 MG capsule, Take 1 capsule (100 mg total) by mouth 3 (three) times daily., Disp: 30 capsule, Rfl: 0   levothyroxine (SYNTHROID) 125 MCG tablet, Take 250 mcg by mouth daily before breakfast., Disp: , Rfl:    mometasone-formoterol (DULERA) 200-5 MCG/ACT AERO, Inhale 2 puffs into the lungs 2 (two) times daily., Disp: 1 each, Rfl: 2   oxyCODONE (OXYCONTIN) 20 mg 12 hr tablet, Take 1 tablet (20 mg total) by mouth every 12 (twelve) hours., Disp: 25 tablet, Rfl: 0   oxyCODONE 10 MG TABS, Take 1 tablet (10 mg total) by mouth every 4 (four) hours as needed for severe pain or moderate pain ((score 7 to 10))., Disp: 30 tablet, Rfl: 0   pantoprazole (PROTONIX) 40 MG tablet, 30 min before lunch or dinner, Disp: 90 tablet, Rfl: 3   polyethylene glycol (MIRALAX / GLYCOLAX) 17 g packet, Take 17 g by mouth 2 (two) times daily., Disp: 14 each, Rfl: 0   Spacer/Aero-Holding Chambers (AEROCHAMBER MV) inhaler, Use as instructed, Disp: 1 each, Rfl: 0   SYRINGE-NEEDLE, DISP, 3 ML 25G X 1-1/2" 3 ML MISC, 1 Device by Does not apply route every 30 (thirty) days. With B12 shot, Disp: 12 each, Rfl: 1   telmisartan (MICARDIS) 80 MG tablet,  1 whole pill qd goal BP <130/<80., Disp: 90 tablet, Rfl: 3   umeclidinium bromide (INCRUSE ELLIPTA) 62.5 MCG/ACT AEPB, Inhale 1 puff into the lungs daily., Disp: 30 each, Rfl: 0   Vitamin D, Ergocalciferol, (DRISDOL) 1.25 MG (50000 UNIT) CAPS capsule, Take 50,000 Units by mouth every 7 (seven) days., Disp: , Rfl:    zolpidem (AMBIEN) 10 MG tablet, Take 0.5-1 tablets (5-10 mg total) by mouth at bedtime as needed for sleep., Disp: 30 tablet, Rfl: 2  Facility-Administered Medications Ordered in Other Visits:    0.9 %  sodium chloride infusion, , Intravenous, Continuous, Mikey College T, MD, Last Rate: 125 mL/hr at 02/10/23 1505, Rate Change at 02/10/23 1505   albuterol (PROVENTIL) (2.5 MG/3ML) 0.083% nebulizer solution 3 mL, 3 mL, Inhalation, Q6H PRN, Emeline General, MD   clonazePAM [facility]) tablet 0.5 mg, 0.5 mg, Oral, BID, Mikey College T, MD   cyclobenzaprine (FLEXERIL) tablet 5-10 mg, 5-10 mg, Oral, TID PRN, Mikey College T, MD   insulin aspart (novoLOG) injection 10 Units, 10 Units, Intravenous, Once **AND** dextrose 50 % solution 50 mL, 1 ampule, Intravenous, Once, Mikey College T, MD   docusate sodium (COLACE) capsule 100 mg, 100 mg, Oral, BID, Zhang, Ping T, MD   escitalopram (LEXAPRO) tablet 10 mg, 10 mg, Oral, Daily, Zhang, Ping T, MD   gabapentin (NEURONTIN) capsule 100 mg, 100 mg, Oral, BID, Zhang, Ping T, MD   heparin injection 5,000 Units, 5,000 Units, Subcutaneous, Q12H,  Emeline General, MD   hydrALAZINE (APRESOLINE) injection 5 mg, 5 mg, Intravenous, Q6H PRN, Mikey College T, MD   HYDROmorphone (DILAUDID) injection 0.5-1 mg, 0.5-1 mg, Intravenous, Q2H PRN, Emeline General, MD   Melene Muller ON 02/11/2023] levothyroxine (SYNTHROID) tablet 250 mcg, 250 mcg, Oral, QAC breakfast, Mikey College T, MD   loratadine (CLARITIN) tablet 10 mg, 10 mg, Oral, Daily, Zhang, Ping T, MD   [START ON 02/11/2023] mometasone-formoterol (DULERA) 200-5 MCG/ACT inhaler 2 puff, 2 puff, Inhalation, BID, Chipper Herb, Renae Fickle, MD    pantoprazole (PROTONIX) EC tablet 40 mg, 40 mg, Oral, Daily, Zhang, Ilda Foil T, MD   polyethylene glycol (MIRALAX / GLYCOLAX) packet 17 g, 17 g, Oral, BID, Mikey College T, MD   sevelamer carbonate (RENVELA) tablet 800 mg, 800 mg, Oral, TID WC, Zhang, Ilda Foil T, MD   sodium chloride tablet 1 g, 1 g, Oral, Once, Mikey College T, MD   sodium zirconium cyclosilicate (LOKELMA) packet 10 g, 10 g, Oral, Once, Mikey College T, MD   [START ON 02/11/2023] umeclidinium bromide (INCRUSE ELLIPTA) 62.5 MCG/ACT 1 puff, 1 puff, Inhalation, Daily, Zhang, Ilda Foil T, MD   zolpidem (AMBIEN) tablet 5-10 mg, 5-10 mg, Oral, QHS PRN, Emeline General, MD  Physical exam:  Vitals:   02/10/23 0915 02/10/23 0921  BP: 95/70 95/60  Pulse: 80 84  Resp: 18   Temp: 97.8 F (36.6 C)   TempSrc: Tympanic   SpO2:  100%  Weight: 182 lb 8 oz (82.8 kg)   Height: 5\' 2"  (1.575 m)    Physical Exam Cardiovascular:     Rate and Rhythm: Normal rate and regular rhythm.     Heart sounds: Normal heart sounds.  Pulmonary:     Effort: Pulmonary effort is normal.     Breath sounds: Normal breath sounds.  Abdominal:     General: Bowel sounds are normal.     Palpations: Abdomen is soft.  Skin:    General: Skin is warm and dry.  Neurological:     Mental Status: She is alert and oriented to person, place, and time.         Latest Ref Rng & Units 02/10/2023    3:00 PM  CMP  Sodium 135 - 145 mmol/L 122       Latest Ref Rng & Units 02/10/2023    9:06 AM  CBC  WBC 4.0 - 10.5 K/uL 14.3   Hemoglobin 12.0 - 15.0 g/dL 8.4   Hematocrit 82.9 - 46.0 % 25.2   Platelets 150 - 400 K/uL 439     No images are attached to the encounter.  DG Chest 1 View  Result Date: 02/10/2023 CLINICAL DATA:  Acute kidney injury. Sodium 120. Elevated creatinine. EXAM: CHEST  1 VIEW COMPARISON:  02/01/2023 FINDINGS: Shallow inspiration. Cardiac enlargement. Hazy interstitial infiltrates throughout both lungs most likely representing edema although possibly  interstitial pneumonia or chronic fibrosis. Small left pleural effusion with basilar atelectasis or consolidation. This could indicate pneumonia in the appropriate clinical setting. No pneumothorax. Postoperative changes in the thoracic spine. No significant change since prior study. IMPRESSION: Cardiac enlargement with probable interstitial pulmonary edema. Small left pleural effusion with basilar atelectasis or consolidation. Similar appearance to previous study. Electronically Signed   By: Burman Nieves M.D.   On: 02/10/2023 15:50   IR BONE MARROW BIOPSY & ASPIRATION  Result Date: 02/05/2023 INDICATION: Hematologic abnormality EXAM: Bone marrow aspiration and core biopsy using fluoroscopic guidance MEDICATIONS: None. ANESTHESIA/SEDATION: Moderate (conscious) sedation was employed during  this procedure. A total of Versed 1.5 mg and Fentanyl 75 mcg was administered intravenously. Moderate Sedation Time: 10 minutes. The patient's level of consciousness and vital signs were monitored continuously by radiology nursing throughout the procedure under my direct supervision. FLUOROSCOPY TIME:  Fluoroscopy Time: 0.7 minutes (13 mGy) COMPLICATIONS: None immediate. PROCEDURE: Informed written consent was obtained from the patient after a thorough discussion of the procedural risks, benefits and alternatives. All questions were addressed. Maximal Sterile Barrier Technique was utilized including caps, mask, sterile gowns, sterile gloves, sterile drape, hand hygiene and skin antiseptic. A timeout was performed prior to the initiation of the procedure. The patient was placed prone on the exam table. Limited fluoroscopy of the pelvis was performed for planning purposes. Skin entry site was marked, and the overlying skin was prepped and draped in the standard sterile fashion. Local analgesia was obtained with 1% lidocaine. Using fluoroscopic guidance, an 11 gauge needle was advanced just deep to the cortex of the right  posterior ilium. Subsequently, bone marrow aspiration and core biopsy were performed. Specimens were submitted to lab/pathology for handling. Hemostasis was achieved with manual pressure, and a clean dressing was placed. The patient tolerated the procedure well without immediate complication. IMPRESSION: Successful bone marrow aspiration and core biopsy of the right posterior ilium. Electronically Signed   By: Olive Bass M.D.   On: 02/05/2023 15:35   CT HEAD WO CONTRAST ( )  Result Date: 02/03/2023 CLINICAL DATA:  Mental status change, unknown cause. Acute hypoxic respiratory failure EXAM: CT HEAD WITHOUT CONTRAST TECHNIQUE: Contiguous axial images were obtained from the base of the skull through the vertex without intravenous contrast. RADIATION DOSE REDUCTION: This exam was performed according to the departmental dose-optimization program which includes automated exposure control, adjustment of the mA and/or kV according to patient size and/or use of iterative reconstruction technique. COMPARISON:  Bone survey 10/09/2022. FINDINGS: Brain: No acute intracranial abnormality. Specifically, no hemorrhage, hydrocephalus, mass lesion, acute infarction, or significant intracranial injury. Vascular: No hyperdense vessel or unexpected calcification. Skull: Focal lucent lesions throughout the skull. 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 These lytic lesions were not in the skull on the skull x-ray from 10/09/2022. Appearance is concerning for possible multiple myeloma. No fracture. Sinuses/Orbits: No acute findings Other: None IMPRESSION: No acute intracranial abnormality. Numerous scattered focal lucent lesions throughout the skull. These are new since prior bone survey from 10/09/2022 and concerning for multiple myeloma. Electronically Signed   By: Charlett Nose M.D.   On: 02/03/2023 16:30   DG Chest Port 1 View  Result Date: 02/01/2023 CLINICAL DATA:  Hypoxia EXAM: PORTABLE  CHEST 1 VIEW COMPARISON:  01/30/2023 FINDINGS: Bilateral diffuse interstitial thickening and patchy alveolar airspace opacities at the right lung base and left mid lower lung. No pleural effusion or pneumothorax. Stable cardiomediastinal silhouette. No acute osseous abnormality. Posterior spinal fusion of the upper and midthoracic spine. IMPRESSION: 1. Bilateral diffuse interstitial thickening and patchy alveolar airspace opacities at the right lung base and left mid lower lung concerning for pulmonary edema. Electronically Signed   By: Elige Ko M.D.   On: 02/01/2023 10:13   ECHOCARDIOGRAM COMPLETE  Result Date: 01/31/2023    ECHOCARDIOGRAM REPORT   Patient Name:   ALEIRA BURZINSKI Date of Exam: 01/31/2023 Medical Rec #:  [id number]      Height:       62.0 in Accession #:    [id number]     Weight:  170.0 lb Date of Birth:  28-Feb-1966      BSA:          1.784 m Patient Age:    57 years       BP:           106/57 mmHg Patient Gender: F              HR:           110 bpm. Exam Location:  [facility] Procedure: 2D Echo, Cardiac Doppler and Color Doppler Indications:     Dyspnea  History:         Patient has no prior history of Echocardiogram examinations.                  Signs/Symptoms:Dyspnea, Chest Pain, Fatigue and Shortness of                  Breath; Risk Factors:Hypertension, Sleep Apnea and Current                  Smoker.  Sonographer:     Mikki Harbor Referring Phys:  [id number] Alford Highland Diagnosing Phys: Julien Nordmann MD  Sonographer Comments: Image acquisition challenging due to respiratory motion. IMPRESSIONS  1. Left ventricular ejection fraction, by estimation, is 60 to 65%. The left ventricle has normal function. The left ventricle has no regional wall motion abnormalities. There is mild left ventricular hypertrophy. Left ventricular diastolic parameters are consistent with Grade I diastolic dysfunction (impaired relaxation).  2. Right ventricular systolic function is normal. The right  ventricular size is normal. There is normal pulmonary artery systolic pressure. The estimated right ventricular systolic pressure is 30.9 mmHg.  3. Left atrial size was mildly dilated.  4. The mitral valve is normal in structure. No evidence of mitral valve regurgitation. No evidence of mitral stenosis.  5. The aortic valve is tricuspid. Aortic valve regurgitation is not visualized. No aortic stenosis is present.  6. The inferior vena cava is normal in size with greater than 50% respiratory variability, suggesting right atrial pressure of 3 mmHg. FINDINGS  Left Ventricle: Left ventricular ejection fraction, by estimation, is 60 to 65%. The left ventricle has normal function. The left ventricle has no regional wall motion abnormalities. The left ventricular internal cavity size was normal in size. There is  mild left ventricular hypertrophy. Left ventricular diastolic parameters are consistent with Grade I diastolic dysfunction (impaired relaxation). Right Ventricle: The right ventricular size is normal. No increase in right ventricular wall thickness. Right ventricular systolic function is normal. There is normal pulmonary artery systolic pressure. The tricuspid regurgitant velocity is 2.64 m/s, and  with an assumed right atrial pressure of 3 mmHg, the estimated right ventricular systolic pressure is 30.9 mmHg. Left Atrium: Left atrial size was mildly dilated. Right Atrium: Right atrial size was normal in size. Pericardium: There is no evidence of pericardial effusion. Mitral Valve: The mitral valve is normal in structure. No evidence of mitral valve regurgitation. No evidence of mitral valve stenosis. MV peak gradient, 8.0 mmHg. The mean mitral valve gradient is 4.0 mmHg. Tricuspid Valve: The tricuspid valve is normal in structure. Tricuspid valve regurgitation is mild . No evidence of tricuspid stenosis. Aortic Valve: The aortic valve is tricuspid. Aortic valve regurgitation is not visualized. No aortic stenosis  is present. Aortic valve mean gradient measures 9.0 mmHg. Aortic valve peak gradient measures 18.4 mmHg. Aortic valve area, by VTI measures 3.04  cm. Pulmonic Valve: The pulmonic valve was normal in  structure. Pulmonic valve regurgitation is not visualized. No evidence of pulmonic stenosis. Aorta: The aortic root is normal in size and structure. Venous: The inferior vena cava is normal in size with greater than 50% respiratory variability, suggesting right atrial pressure of 3 mmHg. IAS/Shunts: No atrial level shunt detected by color flow Doppler.  LEFT VENTRICLE PLAX 2D LVIDd:         5.80 cm   Diastology LVIDs:         3.60 cm   LV e' medial:    9.36 cm/s LV PW:         0.90 cm   LV E/e' medial:  9.7 LV IVS:        1.00 cm   LV e' lateral:   10.00 cm/s LVOT diam:     2.20 cm   LV E/e' lateral: 9.1 LV SV:         99 LV SV Index:   55 LVOT Area:     3.80 cm  RIGHT VENTRICLE RV Basal diam:  3.45 cm RV Mid diam:    3.00 cm RV S prime:     27.40 cm/s TAPSE (M-mode): 3.3 cm LEFT ATRIUM             Index        RIGHT ATRIUM           Index LA diam:        4.80 cm 2.69 cm/m   RA Area:     18.90 cm LA Vol (A2C):   59.6 ml 33.41 ml/m  RA Volume:   54.10 ml  30.32 ml/m LA Vol (A4C):   66.9 ml 37.50 ml/m LA Biplane Vol: 65.0 ml 36.43 ml/m  AORTIC VALVE AV Area (Vmax):    3.09 cm AV Area (Vmean):   3.04 cm AV Area (VTI):     3.04 cm AV Vmax:           214.50 cm/s AV Vmean:          136.750 cm/s AV VTI:            0.326 m AV Peak Grad:      18.4 mmHg AV Mean Grad:      9.0 mmHg LVOT Vmax:         174.50 cm/s LVOT Vmean:        109.500 cm/s LVOT VTI:          0.260 m LVOT/AV VTI ratio: 0.80  AORTA Ao Root diam: 3.20 cm MITRAL VALVE                TRICUSPID VALVE MV Area (PHT): 3.72 cm     TR Peak grad:   27.9 mmHg MV Area VTI:   2.90 cm     TR Vmax:        264.00 cm/s MV Peak grad:  8.0 mmHg MV Mean grad:  4.0 mmHg     SHUNTS MV Vmax:       1.41 m/s     Systemic VTI:  0.26 m MV Vmean:      91.6 cm/s    Systemic  Diam: 2.20 cm MV Decel Time: 204 msec MV E velocity: 90.70 cm/s MV A velocity: 117.00 cm/s MV E/A ratio:  0.78 Julien Nordmann MD Electronically signed by Julien Nordmann MD Signature Date/Time: 01/31/2023/5:43:31 PM    Final    DG Thoracic Spine 2 View  Result Date: 01/31/2023 CLINICAL DATA:  Back pain. 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 EXAM: THORACIC SPINE 2 VIEWS COMPARISON:  MRI of the cervicothoracic spine 01/24/2023. Intraoperative imaging of the thoracic spine 01/28/2023. Chest CTA 01/24/2023. 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 Counting superiorly from the T12 level, the patient has undergone midthoracic spinal decompression with posterior pedicle screw and rod fusion from T4 through T9. The hardware appears intact and well positioned. Chronic superior endplate compression deformity at T11 is unchanged. No acute osseous complications are identified. There is mild atelectasis at both lung bases. IMPRESSION: No demonstrated acute findings status post recent midthoracic spinal decompression and fusion. Electronically Signed   By: Carey Bullocks M.D.   On: 01/31/2023 16:27   DG Chest Port 1 View  Result Date: 01/30/2023 CLINICAL DATA:  Cough, shortness of breath EXAM: PORTABLE CHEST 1 VIEW COMPARISON:  01/24/2023 FINDINGS: Cardiomegaly. Diffuse bilateral interstitial pulmonary opacity and trace pleural effusions. Interval thoracic fusion. IMPRESSION: Cardiomegaly with diffuse bilateral interstitial pulmonary opacity and trace pleural effusions, most consistent with edema. Electronically Signed   By: Jearld Lesch M.D.   On: 01/30/2023 14:07   DG Thoracic Spine 2 View  Result Date: 01/28/2023 CLINICAL DATA:  Elective surgery. T4 through T9 fusion of biopsy for lesion with intra 3D imaging. EXAM: THORACIC SPINE 2 VIEWS COMPARISON:  Preoperative imaging. FINDINGS: Intraoperative 3D imaging of the thoracic spine  obtained. Posterior rod with pedicle screws at multiple levels. Known paravertebral mass better assessed on preoperative imaging. No fluoroscopic spot views provided. Fluoroscopy time 1 minutes 30 seconds. Dose is 97.045 mGy IMPRESSION: Intraoperative 3D imaging of the thoracic spine. Electronically Signed   By: Narda Rutherford M.D.   On: 01/28/2023 16:28   DG C-Arm 1-60 Min-No Report  Result Date: 01/28/2023 Fluoroscopy was utilized by the requesting physician.  No radiographic interpretation.   DG C-Arm 1-60 Min-No Report  Result Date: 01/28/2023 Fluoroscopy was utilized by the requesting physician.  No radiographic interpretation.   DG C-Arm 1-60 Min-No Report  Result Date: 01/28/2023 Fluoroscopy was utilized by the requesting physician.  No radiographic interpretation.   DG C-Arm 1-60 Min-No Report  Result Date: 01/28/2023 Fluoroscopy was utilized by the requesting physician.  No radiographic interpretation.   MR THORACIC SPINE W WO CONTRAST  Result Date: 01/24/2023 CLINICAL DATA:  Soft tissue mass with bony destruction involving the left seventh rib and T6 and T7 vertebral bodies. EXAM: MRI CERVICAL AND THORACIC SPINE WITHOUT AND WITH CONTRAST TECHNIQUE: Multiplanar and multiecho pulse sequences of the cervical spine, to include the craniocervical junction and cervicothoracic junction, and the thoracic spine, were obtained without and with intravenous contrast. CONTRAST:  7mL GADAVIST GADOBUTROL 1 MMOL/ML IV SOLN COMPARISON:  Same day CTA chest, cervical and thoracic spine MRI 07/20/2020 FINDINGS: MRI CERVICAL SPINE FINDINGS Alignment: There is trace retrolisthesis of C3 on C4 and trace anterolisthesis of C5 on C6. Vertebrae: Vertebral body heights are preserved. Background marrow signal is normal. A T1 hypointense/T2 hyperintense lesion in the clivus is unchanged since 2022, favored benign. There is no suspicious marrow signal abnormality or marrow edema in the cervical spine. There is no  abnormal marrow enhancement in the cervical spine. There is a 6 mm enhancing lesion in the right occipital calvarium which was not seen on the prior cervical spine MRI from 2022; however, this area may not has been included within the field of view. Cord: Normal in signal and morphology. Posterior Fossa, vertebral arteries, paraspinal tissues: The imaged posterior fossa is unremarkable. The  vertebral artery flow voids are normal. The paraspinal soft tissues are unremarkable. Disc levels: There is overall mild degenerative change in the cervical spine without significant spinal canal or neural foraminal stenosis. MRI THORACIC SPINE FINDINGS Alignment:  Normal. Vertebrae: Background marrow signal is normal. There is infiltrative T1 hypointensity occupying most of the T6 and T7 vertebral bodies extending into the left pedicles and facets. There is a bulky soft tissue mass in the adjacent left paraspinal/subpleural soft tissues with destruction of the seventh rib. The bulk of the soft tissue component measures up to proximally 7.2 cm x 3.4 cm x 2.7 cm. 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 There is mild loss of vertebral body height at both levels consistent with associated pathologic fractures. There is compression deformity of the T11 vertebral body with up to approximately 30% loss of vertebral body height anteriorly and minimal bony retropulsion. There is faint edema along the superior endplate consistent with acute to subacute chronicity. This fracture may be pathologic. There are probable additional lesions involving multiple additional ribs (for example 25-21, 25-24). The above findings are new since the MRI from 2022. Cord:  There is no cord signal abnormality or abnormal  enhancement. Paraspinal and other soft tissues: Unremarkable, aside from the bulky paraspinal tumor described above. Disc levels: There is a small disc protrusion at T10-T11 without significant spinal canal or neural foraminal stenosis. Otherwise, there is overall minimal background degenerative change without significant spinal canal or neural foraminal stenosis. IMPRESSION: 1. Large soft tissue mass in the left paraspinal soft tissues at T6-T7 with destruction of the seventh rib and invasion of the T6 and T7 vertebral bodies with involvement of the left posterior elements as well as epidural tumor in the spinal canal resulting in moderate spinal canal stenosis without frank cord compression or cord edema. 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 2. Acute to subacute compression fracture of the T11 vertebral body with up to approximately 30% loss of vertebral body height and minimal bony retropulsion, possibly pathologic. 3. Small enhancing lesion in the right occipital calvarium, not definitely present on the prior cervical spine from 2022 (though possibly not included within the field of view on that study), suspicious for an additional metastatic or myeloma lesion. 4. Additional probable lesions involving multiple additional ribs. 5. No acute or suspicious finding in the cervical spine. Electronically Signed   By: Lesia Hausen M.D.   On: 01/24/2023 14:33   MR Cervical Spine W and Wo Contrast  Result Date: 01/24/2023 CLINICAL DATA:  Soft tissue mass with bony destruction involving the left seventh rib and T6 and T7 vertebral bodies. EXAM: MRI CERVICAL AND THORACIC SPINE WITHOUT AND WITH CONTRAST TECHNIQUE: Multiplanar and multiecho pulse sequences of the cervical spine, to include the craniocervical junction and cervicothoracic junction, and the thoracic spine, were obtained without and with intravenous contrast. CONTRAST:  7mL GADAVIST GADOBUTROL 1  MMOL/ML IV SOLN COMPARISON:  Same day CTA chest, cervical and thoracic spine MRI 07/20/2020 FINDINGS: MRI CERVICAL SPINE FINDINGS Alignment: There is trace retrolisthesis of C3 on C4 and trace anterolisthesis of C5 on C6. Vertebrae: Vertebral body heights are preserved. Background marrow signal is normal. A T1 hypointense/T2 hyperintense lesion in the clivus is unchanged since 2022, favored benign.  There is no suspicious marrow signal abnormality or marrow edema in the cervical spine. There is no abnormal marrow enhancement in the cervical spine. There is a 6 mm enhancing lesion in the right occipital calvarium which was not seen on the prior cervical spine MRI from 2022; however, this area may not has been included within the field of view. Cord: Normal in signal and morphology. Posterior Fossa, vertebral arteries, paraspinal tissues: The imaged posterior fossa is unremarkable. The vertebral artery flow voids are normal. The paraspinal soft tissues are unremarkable. Disc levels: There is overall mild degenerative change in the cervical spine without significant spinal canal or neural foraminal stenosis. MRI THORACIC SPINE FINDINGS Alignment:  Normal. Vertebrae: Background marrow signal is normal. There is infiltrative T1 hypointensity occupying most of the T6 and T7 vertebral bodies extending into the left pedicles and facets. There is a bulky soft tissue mass in the adjacent left paraspinal/subpleural soft tissues with destruction of the seventh rib. The bulk of the soft tissue component measures up to proximally 7.2 cm x 3.4 cm x 2.7 cm. 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 There is mild loss of vertebral body height at both levels consistent with associated pathologic  fractures. There is compression deformity of the T11 vertebral body with up to approximately 30% loss of vertebral body height anteriorly and minimal bony retropulsion. There is faint edema along the superior endplate consistent with acute to subacute chronicity. This fracture may be pathologic. There are probable additional lesions involving multiple additional ribs (for example 25-21, 25-24). The above findings are new since the MRI from 2022. Cord:  There is no cord signal abnormality or abnormal enhancement. Paraspinal and other soft tissues: Unremarkable, aside from the bulky paraspinal tumor described above. Disc levels: There is a small disc protrusion at T10-T11 without significant spinal canal or neural foraminal stenosis. Otherwise, there is overall minimal background degenerative change without significant spinal canal or neural foraminal stenosis. IMPRESSION: 1. Large soft tissue mass in the left paraspinal soft tissues at T6-T7 with destruction of the seventh rib and invasion of the T6 and T7 vertebral bodies with involvement of the left posterior elements as well as epidural tumor in the spinal canal resulting in moderate spinal canal stenosis without frank cord compression or cord edema. 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 2. Acute to subacute compression fracture of the T11 vertebral body with up to approximately 30% loss of vertebral body height and minimal bony retropulsion, possibly pathologic. 3. Small enhancing lesion in the right occipital calvarium, not definitely present on the prior cervical spine from 2022 (though possibly not included within the field of view on that study), suspicious for an additional metastatic or myeloma lesion. 4. Additional probable lesions involving multiple additional ribs. 5. No acute or suspicious finding in the cervical spine. Electronically Signed   By: Lesia Hausen M.D.   On: 01/24/2023 14:33   CT  Angio Chest PE W and/or Wo Contrast  Result Date: 01/24/2023 CLINICAL DATA:  Chest pain and shortness of breath for a month. EXAM: CT ANGIOGRAPHY CHEST WITH CONTRAST TECHNIQUE: Multidetector CT imaging of the chest was performed using the standard protocol during bolus administration of intravenous contrast. Multiplanar CT image reconstructions and MIPs were obtained  to evaluate the vascular anatomy. RADIATION DOSE REDUCTION: This exam was performed according to the departmental dose-optimization program which includes automated exposure control, adjustment of the mA and/or kV according to patient size and/or use of iterative reconstruction technique. CONTRAST:  75mL OMNIPAQUE IOHEXOL 350 MG/ML SOLN COMPARISON:  X-ray 01/24/2023 and older.  Previous CT January 2017 FINDINGS: Cardiovascular: The thoracic aorta has a normal course and caliber with minimal calcified plaque. There is a bovine type aortic arch, normal variant. Coronary artery calcifications are seen. Please correlate for other coronary risk factors. Heart is nonenlarged. There is slight wall thickening of the left ventricle. Prominent fat along the intra-atrial septum. Pulmonary arteries show some slight enlargement centrally. Please correlate for any evidence of pulmonary artery hypertension. There is heterogeneous enhancement of the pulmonary arterial tree limiting evaluation for emboli. No obvious large and central embolus although several areas are nondiagnostic. Mediastinum/Nodes: Surgical changes identified with a moderate hiatal hernia. Small thyroid gland. No specific abnormal lymph node enlargement identified in the axillary region, hilum or mediastinum. There are some small nodes identified in the posterior mediastinum, posterior to the descending thoracic aorta such as series 4, image 86 but not pathologic by size criteria. Lungs/Pleura: There is some linear opacity lung bases likely scar or atelectasis. No consolidation. Breathing motion  identified. No pleural effusion or pneumothorax. 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 Benign hepatic cysts as well. Musculoskeletal: There is large destructive mass involving the posterior aspect of the left seventh rib with extension to involve the central canal of the spine in the associated vertebral bodies of T6 and T7. Cord compression is possible. This mass is estimated in diameter on series 4, image 65 at 8.5 by 4.3 cm. Slight extension along the extrapleural space in the posterior mediastinum as well. There is question of some subtle other lucent lesions identified along the spine but indeterminate. Critical Value/emergent results were called by telephone at the time of interpretation on 01/24/2023 at 8:41 am to provider [facility] , who verbally acknowledged these results. Review of the MIP images confirms the above findings. IMPRESSION: Aggressive soft tissue mass with bone destruction involving the left seventh rib as well as the T6 and T7 vertebral bodies and central canal. A neoplastic process is possible. There also is a possibility of significant cord compression with canal encroachment along the spine. Recommend further evaluation with spine MRI with and without contrast and a whole-body bone scan. There appears to be some other subtle lucent bone lesions along the spine and sternum. Please correlate for any history of known malignancy including such processes as myeloma or other. Poor opacification of the pulmonary arterial tree limiting evaluation. Several areas are non diagnostic for pulmonary emboli. No obvious large and central embolus. Aortic Atherosclerosis (ICD10-I70.0). Electronically Signed   By: Karen Kays M.D.   On: 01/24/2023 11:47   DG Chest 2 View  Result Date: 01/24/2023 CLINICAL DATA:  Chest pain EXAM: CHEST - 2 VIEW COMPARISON:  Chest x-ray dated January 07, 2023 FINDINGS: The heart size and mediastinal contours are  within normal limits. Small hiatal hernia. Both lungs are clear. Pleural-based nodular opacity of the left posterior hemithorax. The visualized skeletal structures are unremarkable. IMPRESSION: Pleural-based nodular opacity of the left posterior hemithorax. Recommend further evaluation with contrast enhanced chest CT. Electronically Signed   By: Allegra Lai M.D.   On: 01/24/2023 09:38     Assessment and plan- Patient is a 57 y.o. female with  history of newly diagnosed IgG lambda multiple myeloma here to discuss further management  I plan was to start patient on Darzalex Velcade Revlimid dexamethasone regimen for her multiple myeloma in about 2 weeks time after her wound healing was completed from her recent back surgery.  She was to get a PET scan in 2 days time as an outpatient.  However on today's labs her sodium is 120 and creatinine 5.2 which on repeat check was unchanged.  Repeat BMP also showed potassium level of 5.7.  Due to these labs I am getting her admitted to the hospital and she will need nephrology input to see if she needs dialysis from her myeloma standpoint.  She will need a Foley catheter as well as renal ultrasound to rule out any postobstructive causes of AKI.  If that is ruled out we may have to consider doing first treatment in the inpatient setting with CyBorD to temporize her kidneys before switching her to D VRD regimen.  Patient agrees to go to the ER today     Visit Diagnosis 1. Smoldering multiple myeloma   2. Elevated serum creatinine      Dr. Owens Shark, MD, MPH [facility] [phone] 02/10/2023 4:31 PM

## 2023-02-10 NOTE — Consult Note (Signed)
[facility]  CONSULT NOTE    Date: 02/10/2023                  Patient Name:  Paula Garrett  MRN: [id number]  DOB: 10-24-1965  Age / Sex: 57 y.o., female         PCP: Smitty Cords, DO                 Service Requesting Consult: TRH                 Reason for Consult: Acute kidney injury            History of Present Illness: [name] is a 57 y.o.  female with past medical history hypertension, depression, anxiety, tobacco, and recent diagnosis of multiple myeloma, who was admitted to [facility] on 02/10/2023 for abnormal labs  Patient presents to the ED with abnormal labs from doctors office. It's reported that recent labs were normal but patient now has decreased sodium and elevated creatinine. Patient denies nausea or vomiting. Reports recent NSAID use due to pain, Aleve   Labs on ED arrival with sodium 120, bicarb 19, BUN 54, creatinine 5.30 with GFR 9. Potassium 5.7 today. Elevated WBCs 14.3 with hgb 8.4. Renal ultrasound ordered.    Medications: Outpatient medications: (Not in a hospital admission)   Current medications: Current Facility-Administered Medications  Medication Dose Route Frequency Provider Last Rate Last Admin   0.9 %  sodium chloride infusion   Intravenous Continuous Wendee Beavers, NP       fentaNYL (SUBLIMAZE) injection 50 mcg  50 mcg Intravenous Once Phineas Semen, MD       Current Outpatient Medications  Medication Sig Dispense Refill   albuterol (VENTOLIN HFA) 108 (90 Base) MCG/ACT inhaler Inhale 2 puffs into the lungs every 6 (six) hours as needed for wheezing or shortness of breath. 18 g 11   amLODipine (NORVASC) 10 MG tablet Take 1 tablet (10 mg total) by mouth daily. 90 tablet 3   cetirizine (ZYRTEC) 10 MG tablet Take 1 tablet (10 mg total) by mouth daily. 90 tablet 3   clonazePAM (KLONOPIN) 1 MG tablet Take 1 tablet (1 mg total) by mouth 2 (two) times daily. 60 tablet 2   cyanocobalamin (VITAMIN B12)  1000 MCG/ML injection INJECT 1 ML IN THE MUSCLE EVERY 30 DAYS 4 mL 12   cyclobenzaprine (FLEXERIL) 10 MG tablet Take 0.5-1 tablets (5-10 mg total) by mouth 3 (three) times daily as needed for muscle spasms. 60 tablet 1   docusate sodium (COLACE) 100 MG capsule Take 1 capsule (100 mg total) by mouth 2 (two) times daily. 10 capsule 0   escitalopram (LEXAPRO) 10 MG tablet Take 1 tablet (10 mg total) by mouth daily. 30 tablet 2   gabapentin (NEURONTIN) 100 MG capsule Take 1 capsule (100 mg total) by mouth 3 (three) times daily. 30 capsule 0   levothyroxine (SYNTHROID) 125 MCG tablet Take 250 mcg by mouth daily before breakfast.     mometasone-formoterol (DULERA) 200-5 MCG/ACT AERO Inhale 2 puffs into the lungs 2 (two) times daily. 1 each 2   oxyCODONE (OXYCONTIN) 20 mg 12 hr tablet Take 1 tablet (20 mg total) by mouth every 12 (twelve) hours. 25 tablet 0   oxyCODONE 10 MG TABS Take 1 tablet (10 mg total) by mouth every 4 (four) hours as needed for severe pain or moderate pain ((score 7 to 10)). 30 tablet 0   pantoprazole (PROTONIX)  40 MG tablet 30 min before lunch or dinner 90 tablet 3   polyethylene glycol (MIRALAX / GLYCOLAX) 17 g packet Take 17 g by mouth 2 (two) times daily. 14 each 0   Spacer/Aero-Holding Chambers (AEROCHAMBER MV) inhaler Use as instructed 1 each 0   SYRINGE-NEEDLE, DISP, 3 ML 25G X 1-1/2" 3 ML MISC 1 Device by Does not apply route every 30 (thirty) days. With B12 shot 12 each 1   telmisartan (MICARDIS) 80 MG tablet 1 whole pill qd goal BP <130/<80. 90 tablet 3   umeclidinium bromide (INCRUSE ELLIPTA) 62.5 MCG/ACT AEPB Inhale 1 puff into the lungs daily. 30 each 0   Vitamin D, Ergocalciferol, (DRISDOL) 1.25 MG (50000 UNIT) CAPS capsule Take 50,000 Units by mouth every 7 (seven) days.     zolpidem (AMBIEN) 10 MG tablet Take 0.5-1 tablets (5-10 mg total) by mouth at bedtime as needed for sleep. 30 tablet 2      Allergies: Allergies  Allergen Reactions   Hctz  [Hydrochlorothiazide]     Hyponatremia       Past Medical History: Past Medical History:  Diagnosis Date   Anxiety    Arthritis    Asthma    Barrett's esophagus 2015   COVID-19    03/10/20   Depression    GERD (gastroesophageal reflux disease)    Hypertension    Hypothyroidism    Pneumonia    Smoldering myeloma    Thyroid disease      Past Surgical History: Past Surgical History:  Procedure Laterality Date   ABLATION  2006   APPLICATION OF INTRAOPERATIVE CT SCAN N/A 01/28/2023   Procedure: APPLICATION OF INTRAOPERATIVE CT SCAN;  Surgeon: Loreen Freud, MD;  Location: [facility] ORS;  Service: Neurosurgery;  Laterality: N/A;   BREAST CYST ASPIRATION Left 1998   BREAST SURGERY     cyst aspiration   BREAST SURGERY  1998   cyst aspiration   COLONOSCOPY  03/2014   COLONOSCOPY WITH PROPOFOL N/A 01/06/2020   Procedure: COLONOSCOPY WITH PROPOFOL;  Surgeon: Toledo, Boykin Nearing, MD;  Location: [facility] ENDOSCOPY;  Service: Gastroenterology;  Laterality: N/A;   ENDOMETRIAL ABLATION     ESOPHAGOGASTRODUODENOSCOPY (EGD) WITH PROPOFOL N/A 08/15/2015   Procedure: ESOPHAGOGASTRODUODENOSCOPY (EGD) WITH PROPOFOL;  Surgeon: Christena Deem, MD;  Location: [facility] ENDOSCOPY;  Service: Endoscopy;  Laterality: N/A;   ESOPHAGOGASTRODUODENOSCOPY (EGD) WITH PROPOFOL N/A 01/22/2016   Procedure: ESOPHAGOGASTRODUODENOSCOPY (EGD) WITH PROPOFOL;  Surgeon: Christena Deem, MD;  Location: [facility] ENDOSCOPY;  Service: Endoscopy;  Laterality: N/A;   ESOPHAGOGASTRODUODENOSCOPY (EGD) WITH PROPOFOL N/A 01/06/2020   Procedure: ESOPHAGOGASTRODUODENOSCOPY (EGD) WITH PROPOFOL;  Surgeon: Toledo, Boykin Nearing, MD;  Location: [facility] ENDOSCOPY;  Service: Gastroenterology;  Laterality: N/A;   GASTRIC BYPASS  2005   HERNIA REPAIR     IR BONE MARROW BIOPSY & ASPIRATION  02/05/2023   NASAL SINUS SURGERY     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   TOTAL THYROIDECTOMY     TUBAL LIGATION       Family History: Family History   Problem Relation Age of Onset   Heart disease Mother    COPD Mother    Arthritis Mother    Cancer Mother        uterine   Lupus Mother    Anxiety disorder Mother    Depression Mother    Drug abuse Mother    COPD Father    Arthritis Father    Heart disease Father    Heart attack  Father    Alcohol abuse Father    Heart disease Brother    Heart attack Brother    Drug abuse Brother    Anxiety disorder Brother    Depression Brother    Heart disease Brother    Cancer Brother        liver cancer age 79 y.o   Diabetes Maternal Grandmother    Diabetes Maternal Grandfather    Diabetes Paternal Grandmother    Diabetes Paternal Grandfather    Breast cancer Neg Hx      Social History: Social History   Socioeconomic History   Marital status: Divorced    Spouse name: Not on file   Number of children: 2   Years of education: Not on file   Highest education level: GED or equivalent  Occupational History   Occupation: Part-time  Tobacco Use   Smoking status: Every Day    Current packs/day: 1.00    Average packs/day: 1 pack/day for 42.9 years (42.9 ttl pk-yrs)    Types: Cigarettes    Start date: 03/15/1980   Smokeless tobacco: Never  Vaping Use   Vaping status: Never Used  Substance and Sexual Activity   Alcohol use: Yes    Alcohol/week: 10.0 - 12.0 standard drinks of alcohol    Types: 8 - 10 Glasses of wine, 2 Standard drinks or equivalent per week   Drug use: No   Sexual activity: Yes    Partners: Male  Other Topics Concern   Not on file  Social History Narrative   Lives in [place] single, divorced       Work - 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       Diet - healthy diet   Exercise - limited   Caffeine use: daily   Social Determinants of Health   Financial Resource Strain: Low Risk  (11/01/2022)   Overall Financial Resource Strain (CARDIA)    Difficulty of Paying Living Expenses: Not very hard  Food Insecurity: No Food Insecurity (01/24/2023)   Hunger Vital  Sign    Worried About Running Out of Food in the Last Year: Never true    Ran Out of Food in the Last Year: Never true  Transportation Needs: No Transportation Needs (01/24/2023)   PRAPARE - Administrator, Civil Service (Medical): No    Lack of Transportation (Non-Medical): No  Physical Activity: Insufficiently Active (11/01/2022)   Exercise Vital Sign    Days of Exercise per Week: 3 days    Minutes of Exercise per Session: 20 min  Stress: Stress Concern Present (11/01/2022)   [employer] of Occupational Health - Occupational Stress Questionnaire    Feeling of Stress : Very much  Social Connections: Socially Isolated (11/01/2022)   Social Connection and Isolation Panel [NHANES]    Frequency of Communication with Friends and Family: More than three times a week    Frequency of Social Gatherings with Friends and Family: Once a week    Attends Religious Services: Never    Database administrator or Organizations: No    Attends Engineer, structural: Not on file    Marital Status: Divorced  Intimate Partner Violence: Not At Risk (01/24/2023)   Humiliation, Afraid, Rape, and Kick questionnaire    Fear of Current or Ex-Partner: No    Emotionally Abused: No    Physically Abused: No    Sexually Abused: No    Vital Signs: Blood pressure 98/60, pulse 74, temperature 98.1 F (36.7 C), resp.  rate 15, SpO2 (!) 89%.  Weight trends: There were no vitals filed for this visit.  Physical Exam: General: Ill appearing  Head: Normocephalic, atraumatic. Moist oral mucosal membranes  Eyes: Anicteric  Lungs:  Clear to auscultation, normal effort  Heart: Regular rate and rhythm  Abdomen:  Soft, nontender  Extremities:  No peripheral edema  Neurologic: Alert and oriented, moving all four extremities  Skin: No lesions  Access: None     Lab results: Basic Metabolic Panel: Recent Labs  Lab 02/05/23 0550 02/10/23 0906 02/10/23 1022  NA 131* 120* 120*  K 4.3 5.0 5.7*   CL 95* 89* 88*  CO2 28 19* 21*  GLUCOSE 116* 93 75  BUN 16 54* 58*  CREATININE 0.93 5.30* 5.27*  CALCIUM 11.7* 8.3* 8.4*    Liver Function Tests: Recent Labs  Lab 02/10/23 0906  AST 18  ALT 16  ALKPHOS 60  BILITOT 0.3  PROT 8.2*  ALBUMIN 2.1*   No results for input(s): "LIPASE", "AMYLASE" in the last 168 hours. No results for input(s): "AMMONIA" in the last 168 hours.  CBC: Recent Labs  Lab 02/04/23 0527 02/05/23 0550 02/10/23 0906  WBC 11.9* 10.2 14.3*  NEUTROABS  --  7.9* 10.4*  HGB 8.9* 9.0* 8.4*  HCT 26.5* 27.2* 25.2*  MCV 95.3 94.4 94.4  PLT 397 458* 439*    Cardiac Enzymes: No results for input(s): "CKTOTAL", "CKMB", "CKMBINDEX", "TROPONINI" in the last 168 hours.  BNP: Invalid input(s): "POCBNP"  CBG: No results for input(s): "GLUCAP" in the last 168 hours.  Microbiology: Results for orders placed or performed in visit on 10/27/20  Microscopic Examination     Status: Abnormal   Collection Time: 10/27/20  8:31 AM   Urine  Result Value Ref Range Status   WBC, UA >30 (A) 0 - 5 /hpf Final   RBC, Urine None seen 0 - 2 /hpf Final   Epithelial Cells (non renal) 0-10 0 - 10 /hpf Final   Casts None seen None seen /lpf Final   Bacteria, UA Many (A) None seen/Few Final    Coagulation Studies: No results for input(s): "LABPROT", "INR" in the last 72 hours.  Urinalysis: No results for input(s): "COLORURINE", "LABSPEC", "PHURINE", "GLUCOSEU", "HGBUR", "BILIRUBINUR", "KETONESUR", "PROTEINUR", "UROBILINOGEN", "NITRITE", "LEUKOCYTESUR" in the last 72 hours.  Invalid input(s): "APPERANCEUR"    Imaging: No results found.   Assessment & Plan: [name] is a 57 y.o.  female with past medical history hypertension, depression, anxiety, tobacco, and recent diagnosis of multiple myeloma, who was admitted to [facility] on 02/10/2023 for abnormal labs  Acute kidney injury appears to multifactorial. Admits to NSAID use and recent diagnosis of multiple myeloma.  Awaiting additional workup ordered by primary team. Renal ultrasound ordered. Bladder scan with recorded. Will order a foley catheter to relieve retention and monitor I/Os. Patient received 2L bolus in ED, will order NS 0.9% @ 49ml/hr. Continue to hold NSAIDS and other nephrotoxic agents. No acute need for dialysis at this time. Will monitor closely.   2. Hyperkalemia, likely secondary to kidney injury. Potassium 5.7. Continue IVF.   3. Acute metabolic acidosis, S bicarb 19 on admission. Has corrected to 21 with IV hydration.   4. Multiple myeloma diagnosed with bone marrow biopsy on 02/05/23. Following Dr Smith Robert outpatient.   LOS: 0   8/26/20242:32 PM

## 2023-02-10 NOTE — H&P (Signed)
History and Physical    Paula Garrett NWG:[id number] DOB: 1965-07-19 DOA: 02/10/2023  PCP: Smitty Cords, DO (Confirm with patient/family/NH records and if not entered, this has to be entered at [facility] point of entry) Patient coming from: Home  I have personally briefly reviewed patient's old medical records in [facility]  Chief Complaint: Paula Garrett headed  HPI: Paula Garrett is a 57 y.o. female with medical history significant of recently diagnosed multiple myeloma, HTN, anxiety/depression, GERD, sent from oncology office for abnormal blood work.  Patient reported been feeling lightheaded and drowsy for last 3 to 4 days.  Patient was recently diagnosed with multiple myeloma with significant vertebral invasion T6-T7, and hypocalcemia.  And patient was admitted last week to treat hypercalcemia and new onset of multiple myeloma when she was stabilized with steroid, biphosphate and calcitonin.  She was discharged home 4 days ago.  After she went home, patient reported has been having poorly controlled pain on her back and she has been taking OxyContin 20 mg twice daily and oxycodone 10 mg twice daily for pain control as well as gabapentin 3 times daily.  Over the weekend for last 3 days, patient became lightheaded when standing up and her gait has been very unsteady and sustained several falls.  Also, she reported she been feeling thirsty all the time and has been drinking increasing amount of water to hydrate herself and she reported light-colored urine and normal amount.  Occasionally, she does feel " cannot empty bladder fully", but denies any nauseous vomiting abdominal pain or diarrhea.  No fever or chills no cough.  Today, she went to see oncology who did a blood work in the office showed AKI creatinine 5 and sodium 120 and sent her to ED.  ED Course: Blood pressure borderline low 98/60 nonhypoxic none tachycardia.  Sodium 120, potassium 5.7, creatinine 5.2, BUN 58 bicarb 21.  Patient  was given IV fluid in the ED, nephrology consulted.  Foley placed again and yielded 500 mL of urine clear-colored.  Review of Systems: As per HPI otherwise 14 point review of systems negative.    Past Medical History:  Diagnosis Date   Anxiety    Arthritis    Asthma    Barrett's esophagus 2015   COVID-19    03/10/20   Depression    GERD (gastroesophageal reflux disease)    Hypertension    Hypothyroidism    Pneumonia    Smoldering myeloma    Thyroid disease     Past Surgical History:  Procedure Laterality Date   ABLATION  2006   APPLICATION OF INTRAOPERATIVE CT SCAN N/A 01/28/2023   Procedure: APPLICATION OF INTRAOPERATIVE CT SCAN;  Surgeon: Loreen Freud, MD;  Location: [facility] ORS;  Service: Neurosurgery;  Laterality: N/A;   BREAST CYST ASPIRATION Left 1998   BREAST SURGERY     cyst aspiration   BREAST SURGERY  1998   cyst aspiration   COLONOSCOPY  03/2014   COLONOSCOPY WITH PROPOFOL N/A 01/06/2020   Procedure: COLONOSCOPY WITH PROPOFOL;  Surgeon: Toledo, Boykin Nearing, MD;  Location: [facility] ENDOSCOPY;  Service: Gastroenterology;  Laterality: N/A;   ENDOMETRIAL ABLATION     ESOPHAGOGASTRODUODENOSCOPY (EGD) WITH PROPOFOL N/A 08/15/2015   Procedure: ESOPHAGOGASTRODUODENOSCOPY (EGD) WITH PROPOFOL;  Surgeon: Christena Deem, MD;  Location: [facility] ENDOSCOPY;  Service: Endoscopy;  Laterality: N/A;   ESOPHAGOGASTRODUODENOSCOPY (EGD) WITH PROPOFOL N/A 01/22/2016   Procedure: ESOPHAGOGASTRODUODENOSCOPY (EGD) WITH PROPOFOL;  Surgeon: Christena Deem, MD;  Location: [facility] ENDOSCOPY;  Service: Endoscopy;  Laterality: N/A;   ESOPHAGOGASTRODUODENOSCOPY (EGD) WITH PROPOFOL N/A 01/06/2020   Procedure: ESOPHAGOGASTRODUODENOSCOPY (EGD) WITH PROPOFOL;  Surgeon: Toledo, Boykin Nearing, MD;  Location: [facility] ENDOSCOPY;  Service: Gastroenterology;  Laterality: N/A;   GASTRIC BYPASS  2005   HERNIA REPAIR     IR BONE MARROW BIOPSY & ASPIRATION  02/05/2023   NASAL SINUS SURGERY     partial amputation Left     left 5th toe   TOTAL THYROIDECTOMY     TUBAL LIGATION       reports that she has been smoking cigarettes. She started smoking about 42 years ago. She has a 42.9 pack-year smoking history. She has never used smokeless tobacco. She reports current alcohol use of about 10.0 - 12.0 standard drinks of alcohol per week. She reports that she does not use drugs.  Allergies  Allergen Reactions   Hctz [Hydrochlorothiazide]     Hyponatremia     Family History  Problem Relation Age of Onset   Heart disease Mother    COPD Mother    Arthritis Mother    Cancer Mother        uterine   Lupus Mother    Anxiety disorder Mother    Depression Mother    Drug abuse Mother    COPD Father    Arthritis Father    Heart disease Father    Heart attack Father    Alcohol abuse Father    Heart disease Brother    Heart attack Brother    Drug abuse Brother    Anxiety disorder Brother    Depression Brother    Heart disease Brother    Cancer Brother        liver cancer age 79 y.o   Diabetes Maternal Grandmother    Diabetes Maternal Grandfather    Diabetes Paternal Grandmother    Diabetes Paternal Grandfather    Breast cancer Neg Hx      Prior to Admission medications   Medication Sig Start Date End Date Taking? Authorizing Provider  albuterol (VENTOLIN HFA) 108 (90 Base) MCG/ACT inhaler Inhale 2 puffs into the lungs every 6 (six) hours as needed for wheezing or shortness of breath. 02/06/23   Enedina Finner, MD  amLODipine (NORVASC) 10 MG tablet Take 1 tablet (10 mg total) by mouth daily. 11/01/22   Kara Dies, NP  cetirizine (ZYRTEC) 10 MG tablet Take 1 tablet (10 mg total) by mouth daily. 03/20/22   McLean-Scocuzza, Pasty Spillers, MD  clonazePAM (KLONOPIN) 1 MG tablet Take 1 tablet (1 mg total) by mouth 2 (two) times daily. 11/20/22   Karamalegos, Netta Neat, DO  cyanocobalamin (VITAMIN B12) 1000 MCG/ML injection INJECT 1 ML IN THE MUSCLE EVERY 30 DAYS 03/15/22   McLean-Scocuzza, Pasty Spillers, MD   cyclobenzaprine (FLEXERIL) 10 MG tablet Take 0.5-1 tablets (5-10 mg total) by mouth 3 (three) times daily as needed for muscle spasms. 01/09/23   Karamalegos, Netta Neat, DO  docusate sodium (COLACE) 100 MG capsule Take 1 capsule (100 mg total) by mouth 2 (two) times daily. 02/06/23   Enedina Finner, MD  escitalopram (LEXAPRO) 10 MG tablet Take 1 tablet (10 mg total) by mouth daily. 11/20/22   Karamalegos, Netta Neat, DO  gabapentin (NEURONTIN) 100 MG capsule Take 1 capsule (100 mg total) by mouth 3 (three) times daily. 02/06/23   Enedina Finner, MD  levothyroxine (SYNTHROID) 125 MCG tablet Take 250 mcg by mouth daily before breakfast. 11/20/22   [provider]  mometasone-formoterol (DULERA) 200-5 MCG/ACT  AERO Inhale 2 puffs into the lungs 2 (two) times daily. 02/06/23   Enedina Finner, MD  oxyCODONE (OXYCONTIN) 20 mg 12 hr tablet Take 1 tablet (20 mg total) by mouth every 12 (twelve) hours. 02/06/23   Enedina Finner, MD  oxyCODONE 10 MG TABS Take 1 tablet (10 mg total) by mouth every 4 (four) hours as needed for severe pain or moderate pain ((score 7 to 10)). 02/06/23   Enedina Finner, MD  pantoprazole (PROTONIX) 40 MG tablet 30 min before lunch or dinner 03/20/22   McLean-Scocuzza, Pasty Spillers, MD  polyethylene glycol (MIRALAX / GLYCOLAX) 17 g packet Take 17 g by mouth 2 (two) times daily. 02/06/23   Enedina Finner, MD  Spacer/Aero-Holding Chambers (AEROCHAMBER MV) inhaler Use as instructed 12/13/22   Raechel Chute, MD  SYRINGE-NEEDLE, DISP, 3 ML 25G X 1-1/2" 3 ML MISC 1 Device by Does not apply route every 30 (thirty) days. With B12 shot 10/25/21   McLean-Scocuzza, Pasty Spillers, MD  telmisartan (MICARDIS) 80 MG tablet 1 whole pill qd goal BP <130/<80. 03/20/22   McLean-Scocuzza, Pasty Spillers, MD  umeclidinium bromide (INCRUSE ELLIPTA) 62.5 MCG/ACT AEPB Inhale 1 puff into the lungs daily. 02/07/23   Enedina Finner, MD  Vitamin D, Ergocalciferol, (DRISDOL) 1.25 MG (50000 UNIT) CAPS capsule Take 50,000 Units by mouth every 7 (seven)  days.    [provider]  zolpidem (AMBIEN) 10 MG tablet Take 0.5-1 tablets (5-10 mg total) by mouth at bedtime as needed for sleep. 11/20/22   Smitty Cords, DO    Physical Exam: Vitals:   02/10/23 1210 02/10/23 1327 02/10/23 1454  BP: 117/69 98/60 131/75  Pulse: 88 74 80  Resp: 18 15 20   Temp: 98.1 F (36.7 C)    SpO2: 97% (!) 89% 90%    Constitutional: NAD, calm, comfortable Vitals:   02/10/23 1210 02/10/23 1327 02/10/23 1454  BP: 117/69 98/60 131/75  Pulse: 88 74 80  Resp: 18 15 20   Temp: 98.1 F (36.7 C)    SpO2: 97% (!) 89% 90%   Eyes: PERRL, lids and conjunctivae normal ENMT: Mucous membranes are moist. Posterior pharynx clear of any exudate or lesions.Normal dentition.  Neck: normal, supple, no masses, no thyromegaly Respiratory: clear to auscultation bilaterally, no wheezing, no crackles. Normal respiratory effort. No accessory muscle use.  Cardiovascular: Regular rate and rhythm, no murmurs / rubs / gallops. No extremity edema. 2+ pedal pulses. No carotid bruits.  Abdomen: no tenderness, no masses palpated. No hepatosplenomegaly. Bowel sounds positive.  Musculoskeletal: no clubbing / cyanosis. No joint deformity upper and lower extremities. Good ROM, no contractures. Normal muscle tone.  Skin: no rashes, lesions, ulcers. No induration Neurologic: CN 2-12 grossly intact. Sensation intact, DTR normal. Strength 5/5 in all 4.  Psychiatric: Patient appears to be drowsy, but AAOx3    Labs on Admission: I have personally reviewed following labs and imaging studies  CBC: Recent Labs  Lab 02/04/23 0527 02/05/23 0550 02/10/23 0906  WBC 11.9* 10.2 14.3*  NEUTROABS  --  7.9* 10.4*  HGB 8.9* 9.0* 8.4*  HCT 26.5* 27.2* 25.2*  MCV 95.3 94.4 94.4  PLT 397 458* 439*   Basic Metabolic Panel: Recent Labs  Lab 02/04/23 0527 02/04/23 1740 02/05/23 0550 02/10/23 0906 02/10/23 1022 02/10/23 1315  NA 132*  --  131* 120* 120*  --   K 4.8  --  4.3 5.0  5.7*  --   CL 96*  --  95* 89* 88*  --   CO2  29  --  28 19* 21*  --   GLUCOSE 91  --  116* 93 75  --   BUN 24*  --  16 54* 58*  --   CREATININE 1.01*  --  0.93 5.30* 5.27*  --   CALCIUM 13.8* 12.6* 11.7* 8.3* 8.4*  --   PHOS  --   --   --   --   --  6.4*   GFR: Estimated Creatinine Clearance: 11.5 mL/min (A) (by C-G formula based on SCr of 5.27 mg/dL (H)). Liver Function Tests: Recent Labs  Lab 02/10/23 0906  AST 18  ALT 16  ALKPHOS 60  BILITOT 0.3  PROT 8.2*  ALBUMIN 2.1*   No results for input(s): "LIPASE", "AMYLASE" in the last 168 hours. No results for input(s): "AMMONIA" in the last 168 hours. Coagulation Profile: No results for input(s): "INR", "PROTIME" in the last 168 hours. Cardiac Enzymes: No results for input(s): "CKTOTAL", "CKMB", "CKMBINDEX", "TROPONINI" in the last 168 hours. BNP (last 3 results) No results for input(s): "PROBNP" in the last 8760 hours. HbA1C: No results for input(s): "HGBA1C" in the last 72 hours. CBG: No results for input(s): "GLUCAP" in the last 168 hours. Lipid Profile: No results for input(s): "CHOL", "HDL", "LDLCALC", "TRIG", "CHOLHDL", "LDLDIRECT" in the last 72 hours. Thyroid Function Tests: No results for input(s): "TSH", "T4TOTAL", "FREET4", "T3FREE", "THYROIDAB" in the last 72 hours. Anemia Panel: Recent Labs    02/10/23 0906  FERRITIN 142  TIBC 258  IRON 33   Urine analysis:    Component Value Date/Time   COLORURINE YELLOW 07/14/2019 1459   APPEARANCEUR Cloudy (A) 10/27/2020 0831   LABSPEC 1.010 07/14/2019 1459   PHURINE 5.5 07/14/2019 1459   GLUCOSEU Negative 10/27/2020 0831   GLUCOSEU NEGATIVE 07/25/2017 0823   HGBUR NEGATIVE 07/14/2019 1459   BILIRUBINUR Negative 10/27/2020 0831   KETONESUR NEGATIVE 07/14/2019 1459   PROTEINUR Trace 10/27/2020 0831   PROTEINUR NEGATIVE 07/14/2019 1459   UROBILINOGEN 0.2 06/01/2018 0927   UROBILINOGEN 0.2 07/25/2017 0823   NITRITE Positive (A) 10/27/2020 0831   NITRITE  NEGATIVE 07/14/2019 1459   LEUKOCYTESUR 3+ (A) 10/27/2020 0831   LEUKOCYTESUR NEGATIVE 07/14/2019 1459    Radiological Exams on Admission: No results found.  EKG: Independently reviewed.  Sinus rhythm, no acute ST changes.  Assessment/Plan Principal Problem:   AKI (acute kidney injury) (HCC) Active Problems:   Multiple myeloma (HCC)  (please populate well all problems here in Problem List. (For example, if patient is on BP meds at home and you resume or decide to hold them, it is a problem that needs to be her. Same for CAD, COPD, HLD and so on)  AKI -Probably multiple factorial, rule out ATN.  Clinically suspect the patient has been hypotensive for 2 to 3 days, on top of that patient appears to have acute urinary retention, so postrenal etiology may also take a part -Repeat sodium level 122, after an initial of 2 L liters of IV bolus, plan to continue IV fluid to give kidney perfusion and try to boost blood pressure.  Monitor sodium level in 6 hours.  And tomorrow morning.  Nephrology on board -Renal ultrasound pending -Continue Foley -Strict I&O's -UA is pending -Other DDx, discussed with oncology, low suspicion for tumor lysis and uric acid level 7.5.  CK within normal limits.  Hyponatremia -Euvolemic, likely secondary to AKI, given there is still significant hypotension/volume contraction, decided to continue normal saline, will monitor sodium level.  Sodium chloride tablet  x1.  Hyperkalemia, mild -Secondary to AKI, no EKG changes, hyperkalemia cocktail given, 1 dose of Lokelma given, repeat BMP this evening  Hyperphosphatemia -Renvela  HTN, hypotensive -Hold off home BP meds -Start as needed hydralazine  Encephalopathy, mild -Likely side effect from narcotics on top of AKI -Hold off OxyContin and oxycodone, start as needed IV Dilaudid for pain control. -Reduce gabapentin dosage -Reduce Klonopin dosage  MM -No acute concern, outpatient follow-up with  oncology  Asthma/COPD -No acute concerns, continue ICS and LABA   Hypothyroidism -Continue Synthroid  Anxiety/depression -Continue SSRI   DVT prophylaxis: Heparin subcu Code Status: Full code Family Communication: None at bedside Disposition Plan: Patient is sick with AKI and multiple electrolyte abnormalities, requiring IV fluid, closely monitoring kidney function and inpatient nephrology consult, expect more than 2 midnight hospital stay Consults called: Nephrology Admission status: Tele admit   Emeline General MD [facility] Pager [phone]  02/10/2023, 3:03 PM

## 2023-02-10 NOTE — Progress Notes (Signed)
Clinical Pharmacist Practitioner Clinic [facility]  Telephone:([phone] Fax:([phone]  Patient Care Team: Smitty Cords, DO as PCP - General (Family Medicine) Lemar Livings, Merrily Pew, MD (General Surgery) Shelia Media, MD (Internal Medicine) Creig Hines, MD as Consulting Physician (Oncology)   Name of the patient: Paula Garrett  [id number]  November 16, 1965   Date of visit: 02/10/23  HPI: Patient is a 57 y.o. female with newly diagnosed IgG lambda multiple myeloma.   Reason for Consult: Lenalidomide oral chemotherapy education (patient to delay starting due to current renal function).   PAST MEDICAL HISTORY: Past Medical History:  Diagnosis Date   Anxiety    Arthritis    Asthma    Barrett's esophagus 2015   COVID-19    03/10/20   Depression    GERD (gastroesophageal reflux disease)    Hypertension    Hypothyroidism    Pneumonia    Smoldering myeloma    Thyroid disease     HEMATOLOGY/ONCOLOGY HISTORY:  Oncology History   No history exists.    ALLERGIES:  is allergic to hctz [hydrochlorothiazide].  MEDICATIONS:  Current Outpatient Medications  Medication Sig Dispense Refill   albuterol (VENTOLIN HFA) 108 (90 Base) MCG/ACT inhaler Inhale 2 puffs into the lungs every 6 (six) hours as needed for wheezing or shortness of breath. 18 g 11   amLODipine (NORVASC) 10 MG tablet Take 1 tablet (10 mg total) by mouth daily. 90 tablet 3   cetirizine (ZYRTEC) 10 MG tablet Take 1 tablet (10 mg total) by mouth daily. 90 tablet 3   clonazePAM (KLONOPIN) 1 MG tablet Take 1 tablet (1 mg total) by mouth 2 (two) times daily. 60 tablet 2   cyanocobalamin (VITAMIN B12) 1000 MCG/ML injection INJECT 1 ML IN THE MUSCLE EVERY 30 DAYS 4 mL 12   cyclobenzaprine (FLEXERIL) 10 MG tablet Take 0.5-1 tablets (5-10 mg total) by mouth 3 (three) times daily as needed for muscle spasms. 60 tablet 1   docusate sodium (COLACE) 100 MG capsule Take 1 capsule (100 mg total)  by mouth 2 (two) times daily. 10 capsule 0   escitalopram (LEXAPRO) 10 MG tablet Take 1 tablet (10 mg total) by mouth daily. 30 tablet 2   gabapentin (NEURONTIN) 100 MG capsule Take 1 capsule (100 mg total) by mouth 3 (three) times daily. 30 capsule 0   levothyroxine (SYNTHROID) 125 MCG tablet Take 250 mcg by mouth daily before breakfast.     mometasone-formoterol (DULERA) 200-5 MCG/ACT AERO Inhale 2 puffs into the lungs 2 (two) times daily. 1 each 2   oxyCODONE (OXYCONTIN) 20 mg 12 hr tablet Take 1 tablet (20 mg total) by mouth every 12 (twelve) hours. 25 tablet 0   oxyCODONE 10 MG TABS Take 1 tablet (10 mg total) by mouth every 4 (four) hours as needed for severe pain or moderate pain ((score 7 to 10)). 30 tablet 0   pantoprazole (PROTONIX) 40 MG tablet 30 min before lunch or dinner 90 tablet 3   polyethylene glycol (MIRALAX / GLYCOLAX) 17 g packet Take 17 g by mouth 2 (two) times daily. 14 each 0   Spacer/Aero-Holding Chambers (AEROCHAMBER MV) inhaler Use as instructed 1 each 0   SYRINGE-NEEDLE, DISP, 3 ML 25G X 1-1/2" 3 ML MISC 1 Device by Does not apply route every 30 (thirty) days. With B12 shot 12 each 1   telmisartan (MICARDIS) 80 MG tablet 1 whole pill qd goal BP <130/<80. 90 tablet 3   umeclidinium bromide (INCRUSE ELLIPTA)  62.5 MCG/ACT AEPB Inhale 1 puff into the lungs daily. 30 each 0   Vitamin D, Ergocalciferol, (DRISDOL) 1.25 MG (50000 UNIT) CAPS capsule Take 50,000 Units by mouth every 7 (seven) days.     zolpidem (AMBIEN) 10 MG tablet Take 0.5-1 tablets (5-10 mg total) by mouth at bedtime as needed for sleep. 30 tablet 2   No current facility-administered medications for this visit.    VITAL SIGNS: There were no vitals taken for this visit. There were no vitals filed for this visit.  Estimated body mass index is 33.38 kg/m as calculated from the following:   Height as of an earlier encounter on 02/10/23: 5\' 2"  (1.575 m).   Weight as of an earlier encounter on 02/10/23: 82.8 kg  (182 lb 8 oz).  LABS: CBC:    Component Value Date/Time   WBC 14.3 (H) 02/10/2023 0906   HGB 8.4 (L) 02/10/2023 0906   HGB 13.0 02/14/2012 1528   HCT 25.2 (L) 02/10/2023 0906   HCT 38.5 07/02/2011 1529   PLT 439 (H) 02/10/2023 0906   PLT 311 07/02/2011 1529   MCV 94.4 02/10/2023 0906   MCV 90 07/02/2011 1529   NEUTROABS 10.4 (H) 02/10/2023 0906   LYMPHSABS 2.7 02/10/2023 0906   MONOABS 1.0 02/10/2023 0906   EOSABS 0.1 02/10/2023 0906   BASOSABS 0.0 02/10/2023 0906   Comprehensive Metabolic Panel:    Component Value Date/Time   NA 120 (L) 02/10/2023 1022   K 5.7 (H) 02/10/2023 1022   CL 88 (L) 02/10/2023 1022   CO2 21 (L) 02/10/2023 1022   BUN 58 (H) 02/10/2023 1022   CREATININE 5.27 (H) 02/10/2023 1022   GLUCOSE 75 02/10/2023 1022   CALCIUM 8.4 (L) 02/10/2023 1022   AST 18 02/10/2023 0906   ALT 16 02/10/2023 0906   ALKPHOS 60 02/10/2023 0906   BILITOT 0.3 02/10/2023 0906   PROT 8.2 (H) 02/10/2023 0906   ALBUMIN 2.1 (L) 02/10/2023 0906     Present during today's visit: patient and her friend   Start plan: Patient to delay starting lenalidomide due to current renal function. MD will alert when patient is ready to start lenalidomide   Patient Education I spoke with patient for a brief overview of new oral chemotherapy medication: lenalidomide   Administration: Counseled patient on administration, dosing, side effects, safe handling, storage, and disposal.  Lenalidomide REMs education provided. Patient has been without period for 10+ years. Patient reports having her tubes tied and an ablation to stop her period.   Side Effects: Side effects include but not limited to: rash, constipation/diarrhea, decreased wbc/hgb/plt, risk of clot.    Ms. Carsten voiced understanding and appreciation. All questions answered. Medication and REMs handout provided.  Provided patient with Oral Chemotherapy Navigation Clinic phone number. Patient knows to call the office with  questions or concerns. Oral Chemotherapy Navigation Clinic will continue to follow.  Patient expressed understanding and was in agreement with this plan. She also understands that She can call clinic at any time with any questions, concerns, or complaints.   Thank you for allowing me to participate in the care of this patient.   Time Total: 15 mins  Visit consisted of counseling and education on dealing with issues of symptom management in the setting of serious and potentially life-threatening illness.Greater than 50%  of this time was spent counseling and coordinating care related to the above assessment and plan.  Signed by: Remi Haggard, PharmD, BCPS, BCOP, CPP Hematology/Oncology Clinical Pharmacist Practitioner  [place] [phone]  02/10/2023 11:15 AM

## 2023-02-10 NOTE — Telephone Encounter (Signed)
*  post op thoracic fusion and biopsy on 01/28/23/xrays today  Patient cancelled todays postop, her cancer doctor is admitting her today. She wants to notify her surgeon if maybe he can go see her.

## 2023-02-10 NOTE — Telephone Encounter (Signed)
Spoke w/ her oncologist.

## 2023-02-10 NOTE — Transitions of Care (Post Inpatient/ED Visit) (Signed)
02/10/2023  Name: Paula Garrett MRN: [id number] DOB: 02-Jan-1966  Today's TOC FU Call Status: Today's TOC FU Call Status:: Successful TOC FU Call Completed TOC FU Call Complete Date: 02/10/23 Patient's Name and Date of Birth confirmed.  Transition Care Management Follow-up Telephone Call Date of Discharge: 02/06/23 Discharge Facility: [facility]) Type of Discharge: Inpatient Admission Primary Inpatient Discharge Diagnosis:: Hypercalcemia How have you been since you were released from the hospital?: Better Any questions or concerns?: No  Items Reviewed: Did you receive and understand the discharge instructions provided?: Yes Medications obtained,verified, and reconciled?: Yes (Medications Reviewed) Any new allergies since your discharge?: No Dietary orders reviewed?: Yes Do you have support at home?: Yes  Medications Reviewed Today: Medications Reviewed Today     Reviewed by Merleen Nicely, LPN (Licensed Practical Nurse) on 02/10/23 at 1015  Med List Status: <None>   Medication Order Taking? Sig Documenting Provider Last Dose Status Informant  albuterol (VENTOLIN HFA) 108 (90 Base) MCG/ACT inhaler [id number] Yes Inhale 2 puffs into the lungs every 6 (six) hours as needed for wheezing or shortness of breath. Enedina Finner, MD Taking Active   amLODipine (NORVASC) 10 MG tablet [id number] Yes Take 1 tablet (10 mg total) by mouth daily. Kara Dies, NP Taking Active   cetirizine (ZYRTEC) 10 MG tablet [id number] Yes Take 1 tablet (10 mg total) by mouth daily. McLean-Scocuzza, Pasty Spillers, MD Taking Active   clonazePAM Scarlette Calico) 1 MG tablet [id number] Yes Take 1 tablet (1 mg total) by mouth 2 (two) times daily. Smitty Cords, DO Taking Active   cyanocobalamin (VITAMIN B12) 1000 MCG/ML injection [id number] Yes INJECT 1 ML IN THE MUSCLE EVERY 30 DAYS McLean-Scocuzza, Pasty Spillers, MD Taking Active            Med Note [facility], TIFFANY A   Fri Jan 24, 2023   2:35 PM) Hasn't had in over a month but needs to have it  cyclobenzaprine (FLEXERIL) 10 MG tablet [id number] Yes Take 0.5-1 tablets (5-10 mg total) by mouth 3 (three) times daily as needed for muscle spasms. Smitty Cords, DO Taking Active   docusate sodium (COLACE) 100 MG capsule [id number] Yes Take 1 capsule (100 mg total) by mouth 2 (two) times daily. Enedina Finner, MD Taking Active   escitalopram (LEXAPRO) 10 MG tablet [id number] Yes Take 1 tablet (10 mg total) by mouth daily. Smitty Cords, DO Taking Active   gabapentin (NEURONTIN) 100 MG capsule [id number] Yes Take 1 capsule (100 mg total) by mouth 3 (three) times daily. Enedina Finner, MD Taking Active   levothyroxine (SYNTHROID) 125 MCG tablet [id number] Yes Take 250 mcg by mouth daily before breakfast. [provider] Taking Active   mometasone-formoterol (DULERA) 200-5 MCG/ACT AERO [id number] Yes Inhale 2 puffs into the lungs 2 (two) times daily. Enedina Finner, MD Taking Active   oxyCODONE (OXYCONTIN) 20 mg 12 hr tablet [id number] Yes Take 1 tablet (20 mg total) by mouth every 12 (twelve) hours. Enedina Finner, MD Taking Active   oxyCODONE 10 MG TABS [id number] Yes Take 1 tablet (10 mg total) by mouth every 4 (four) hours as needed for severe pain or moderate pain ((score 7 to 10)). Enedina Finner, MD Taking Active   pantoprazole (PROTONIX) 40 MG tablet [id number] Yes 30 min before lunch or dinner McLean-Scocuzza, Pasty Spillers, MD Taking Active   polyethylene glycol (MIRALAX / GLYCOLAX) 17 g packet [id number] Yes Take 17 g by mouth 2 (two) times daily. Enedina Finner, MD Taking Active  Spacer/Aero-Holding Chambers (AEROCHAMBER MV) inhaler [id number] Yes Use as instructed Raechel Chute, MD Taking Active   SYRINGE-NEEDLE, DISP, 3 ML 25G X 1-1/2" 3 ML MISC [id number] Yes 1 Device by Does not apply route every 30 (thirty) days. With B12 shot McLean-Scocuzza, Pasty Spillers, MD Taking Active   telmisartan (MICARDIS) 80 MG tablet [id number] Yes 1 whole pill  qd goal BP <130/<80. McLean-Scocuzza, Pasty Spillers, MD Taking Active   umeclidinium bromide (INCRUSE ELLIPTA) 62.5 MCG/ACT AEPB [id number] Yes Inhale 1 puff into the lungs daily. Enedina Finner, MD Taking Active   Vitamin D, Ergocalciferol, (DRISDOL) 1.25 MG (50000 UNIT) CAPS capsule [id number] Yes Take 50,000 Units by mouth every 7 (seven) days. [provider] Taking Active   zolpidem (AMBIEN) 10 MG tablet [id number] Yes Take 0.5-1 tablets (5-10 mg total) by mouth at bedtime as needed for sleep. Smitty Cords, DO Taking Active             Home Care and Equipment/Supplies: Were Home Health Services Ordered?: No Any new equipment or medical supplies ordered?: No  Functional Questionnaire: Do you need assistance with bathing/showering or dressing?: No Do you need assistance with meal preparation?: No Do you need assistance with eating?: No Do you have difficulty maintaining continence: No Do you need assistance with getting out of bed/getting out of a chair/moving?: No Do you have difficulty managing or taking your medications?: No  Follow up appointments reviewed: PCP Follow-up appointment confirmed?: No MD Provider Line Number:[phone] Given: Yes Specialist Hospital Follow-up appointment confirmed?: Yes Date of Specialist follow-up appointment?: 02/10/23 Follow-Up Specialty Provider:: oncologist Do you need transportation to your follow-up appointment?: No Do you understand care options if your condition(s) worsen?: Yes-patient verbalized understanding    SIGNATURE  Woodfin Ganja LPN [facility] Nurse Health Advisor Direct Dial [phone]

## 2023-02-10 NOTE — ED Notes (Signed)
US at bedside

## 2023-02-11 ENCOUNTER — Encounter: Payer: Self-pay | Admitting: Oncology

## 2023-02-11 ENCOUNTER — Inpatient Hospital Stay: Payer: Managed Care, Other (non HMO)

## 2023-02-11 ENCOUNTER — Other Ambulatory Visit: Payer: Self-pay | Admitting: Oncology

## 2023-02-11 DIAGNOSIS — Z515 Encounter for palliative care: Secondary | ICD-10-CM

## 2023-02-11 DIAGNOSIS — N179 Acute kidney failure, unspecified: Secondary | ICD-10-CM | POA: Diagnosis not present

## 2023-02-11 DIAGNOSIS — C9000 Multiple myeloma not having achieved remission: Secondary | ICD-10-CM | POA: Diagnosis not present

## 2023-02-11 LAB — URINALYSIS, COMPLETE (UACMP) WITH MICROSCOPIC
Bilirubin Urine: NEGATIVE
Glucose, UA: NEGATIVE mg/dL
Hgb urine dipstick: NEGATIVE
Ketones, ur: NEGATIVE mg/dL
Leukocytes,Ua: NEGATIVE
Nitrite: NEGATIVE
Protein, ur: NEGATIVE mg/dL
Specific Gravity, Urine: 1.006 (ref 1.005–1.030)
pH: 5 (ref 5.0–8.0)

## 2023-02-11 LAB — BASIC METABOLIC PANEL
Anion gap: 10 (ref 5–15)
BUN: 59 mg/dL — ABNORMAL HIGH (ref 6–20)
CO2: 18 mmol/L — ABNORMAL LOW (ref 22–32)
Calcium: 7.7 mg/dL — ABNORMAL LOW (ref 8.9–10.3)
Chloride: 98 mmol/L (ref 98–111)
Creatinine, Ser: 5.3 mg/dL — ABNORMAL HIGH (ref 0.44–1.00)
GFR, Estimated: 9 mL/min — ABNORMAL LOW (ref >60)
Glucose, Bld: 92 mg/dL (ref 70–99)
Potassium: 5.4 mmol/L — ABNORMAL HIGH (ref 3.5–5.1)
Sodium: 126 mmol/L — ABNORMAL LOW (ref 135–145)

## 2023-02-11 LAB — URIC ACID: Uric Acid, Serum: 7.5 mg/dL — ABNORMAL HIGH (ref 2.5–7.1)

## 2023-02-11 LAB — PROTEIN / CREATININE RATIO, URINE
Creatinine, Urine: 29 mg/dL
Protein Creatinine Ratio: 1.69 mg/mg{Cre} — ABNORMAL HIGH (ref 0.00–0.15)
Total Protein, Urine: 49 mg/dL

## 2023-02-11 LAB — PHOSPHORUS: Phosphorus: 6.8 mg/dL — ABNORMAL HIGH (ref 2.5–4.6)

## 2023-02-11 MED ORDER — HEPARIN SOD (PORK) LOCK FLUSH 100 UNIT/ML IV SOLN: 500.0000 [IU] | Freq: Once | INTRAVENOUS | Status: DC | PRN

## 2023-02-11 MED ORDER — PALONOSETRON HCL INJECTION 0.25 MG/5ML
0.2500 mg | Freq: Once | INTRAVENOUS | Status: AC
  Administered 2023-02-12: 0.25 mg via INTRAVENOUS
  Filled 2023-02-11: qty 5

## 2023-02-11 MED ORDER — PROCHLORPERAZINE MALEATE 10 MG PO TABS: 10.0000 mg | ORAL_TABLET | Freq: Four times a day (QID) | ORAL | 1 refills | Status: DC | PRN

## 2023-02-11 MED ORDER — LACTULOSE 10 GM/15ML PO SOLN
20.0000 g | Freq: Two times a day (BID) | ORAL | Status: DC
  Administered 2023-02-11: 20 g via ORAL
  Filled 2023-02-11: qty 30

## 2023-02-11 MED ORDER — ALBUTEROL SULFATE (2.5 MG/3ML) 0.083% IN NEBU: 2.5000 mg | INHALATION_SOLUTION | Freq: Once | RESPIRATORY_TRACT | Status: DC | PRN

## 2023-02-11 MED ORDER — POLYETHYLENE GLYCOL 3350 17 G PO PACK
17.0000 g | PACK | Freq: Every day | ORAL | Status: DC
  Administered 2023-02-12 – 2023-02-14 (×3): 17 g via ORAL
  Filled 2023-02-11 (×7): qty 1

## 2023-02-11 MED ORDER — HYDROMORPHONE HCL 2 MG PO TABS
1.0000 mg | ORAL_TABLET | ORAL | Status: DC | PRN
  Administered 2023-02-12 – 2023-02-14 (×7): 2 mg via ORAL
  Administered 2023-02-14: 1 mg via ORAL
  Administered 2023-02-14 – 2023-02-16 (×9): 2 mg via ORAL
  Filled 2023-02-11 (×17): qty 1

## 2023-02-11 MED ORDER — SODIUM CHLORIDE 0.9% FLUSH: 3.0000 mL | INTRAVENOUS | Status: DC | PRN

## 2023-02-11 MED ORDER — SODIUM CHLORIDE 0.9 % IV SOLN: Freq: Once | INTRAVENOUS | Status: DC | PRN

## 2023-02-11 MED ORDER — METHYLPREDNISOLONE SODIUM SUCC 125 MG IJ SOLR: 125.0000 mg | Freq: Once | INTRAMUSCULAR | Status: DC | PRN

## 2023-02-11 MED ORDER — CHLORHEXIDINE GLUCONATE CLOTH 2 % EX PADS
6.0000 | MEDICATED_PAD | Freq: Every day | CUTANEOUS | Status: DC
  Administered 2023-02-11 – 2023-02-19 (×7): 6 via TOPICAL

## 2023-02-11 MED ORDER — DIPHENHYDRAMINE HCL 50 MG/ML IJ SOLN: 50.0000 mg | Freq: Once | INTRAMUSCULAR | Status: DC | PRN

## 2023-02-11 MED ORDER — HEPARIN SOD (PORK) LOCK FLUSH 100 UNIT/ML IV SOLN: 250.0000 [IU] | Freq: Once | INTRAVENOUS | Status: DC | PRN

## 2023-02-11 MED ORDER — FENTANYL 12 MCG/HR TD PT72
1.0000 | MEDICATED_PATCH | TRANSDERMAL | Status: DC
  Administered 2023-02-11 – 2023-02-14 (×2): 1 via TRANSDERMAL
  Filled 2023-02-11 (×2): qty 1

## 2023-02-11 MED ORDER — ONDANSETRON HCL 8 MG PO TABS: 8.0000 mg | ORAL_TABLET | Freq: Two times a day (BID) | ORAL | 1 refills | Status: DC | PRN

## 2023-02-11 MED ORDER — SODIUM CHLORIDE 0.9% FLUSH: 10.0000 mL | INTRAVENOUS | Status: DC | PRN

## 2023-02-11 MED ORDER — SODIUM CHLORIDE 0.9 % IV SOLN: Freq: Once | INTRAVENOUS | Status: AC

## 2023-02-11 MED ORDER — SODIUM CHLORIDE 0.9 % IV SOLN: INTRAVENOUS | Status: DC

## 2023-02-11 MED ORDER — EPINEPHRINE 0.3 MG/0.3ML IJ SOAJ: 0.3000 mg | Freq: Once | INTRAMUSCULAR | Status: DC | PRN

## 2023-02-11 MED ORDER — FAMOTIDINE IN NACL 20-0.9 MG/50ML-% IV SOLN: 20.0000 mg | Freq: Once | INTRAVENOUS | Status: DC | PRN

## 2023-02-11 MED ORDER — DEXAMETHASONE 4 MG PO TABS
ORAL_TABLET | ORAL | 3 refills | Status: DC
Start: 2023-02-11 — End: 2023-03-23

## 2023-02-11 MED ORDER — ALTEPLASE 2 MG IJ SOLR: 2.0000 mg | Freq: Once | INTRAMUSCULAR | Status: DC | PRN

## 2023-02-11 MED ORDER — ACYCLOVIR 400 MG PO TABS: 400.0000 mg | ORAL_TABLET | Freq: Two times a day (BID) | ORAL | 3 refills | Status: DC

## 2023-02-11 MED ORDER — SODIUM ZIRCONIUM CYCLOSILICATE 10 G PO PACK
10.0000 g | PACK | Freq: Once | ORAL | Status: AC
  Administered 2023-02-11: 10 g via ORAL
  Filled 2023-02-11: qty 1

## 2023-02-11 MED ORDER — SODIUM CHLORIDE 0.9 % IV SOLN
500.0000 mg/m2 | Freq: Once | INTRAVENOUS | Status: AC
  Administered 2023-02-12: 1000 mg via INTRAVENOUS
  Filled 2023-02-11: qty 50

## 2023-02-11 MED ORDER — BISACODYL 5 MG PO TBEC
10.0000 mg | DELAYED_RELEASE_TABLET | Freq: Every day | ORAL | Status: DC
  Administered 2023-02-11 – 2023-02-21 (×6): 10 mg via ORAL
  Filled 2023-02-11 (×11): qty 2

## 2023-02-11 MED ORDER — OXYCODONE HCL 5 MG PO TABS: 5.0000 mg | ORAL_TABLET | ORAL | Status: DC | PRN

## 2023-02-11 MED ORDER — SODIUM BICARBONATE 8.4 % IV SOLN
INTRAVENOUS | Status: DC
  Filled 2023-02-11: qty 150

## 2023-02-11 MED ORDER — ALLOPURINOL 100 MG PO TABS
100.0000 mg | ORAL_TABLET | Freq: Every day | ORAL | Status: DC
  Administered 2023-02-11 – 2023-02-21 (×11): 100 mg via ORAL
  Filled 2023-02-11 (×11): qty 1

## 2023-02-11 MED ORDER — BORTEZOMIB CHEMO SQ INJECTION 3.5 MG (2.5MG/ML)
1.3000 mg/m2 | Freq: Once | INTRAMUSCULAR | Status: AC
  Administered 2023-02-12: 2.5 mg via SUBCUTANEOUS
  Filled 2023-02-11: qty 1

## 2023-02-11 NOTE — Progress Notes (Signed)
START ON PATHWAY REGIMEN - Multiple Myeloma and Other Plasma Cell Dyscrasias     A cycle is every 21 days:     Dexamethasone      Bortezomib      Cyclophosphamide   **Always confirm dose/schedule in your pharmacy ordering system**  Patient Characteristics: Multiple Myeloma, Newly Diagnosed, Transplant Eligible, Unknown or Awaiting Test Results Disease Classification: Multiple Myeloma Therapeutic Status: Newly Diagnosed R2-ISS Staging: Awaiting Test Results Is Patient Eligible for Transplant<= Transplant Eligible Risk Status: Awaiting Test Results Intent of Therapy: Non-Curative / Palliative Intent, Discussed with Patient

## 2023-02-11 NOTE — Progress Notes (Signed)
[facility]  ROUNDING NOTE   Subjective:   Patient seen sitting up in bed Alert and oriented States she feels better today  Creatinine remains 5.30 Sodium 126  Objective:  Vital signs in last 24 hours:  Temp:  [97.5 F (36.4 C)-98.9 F (37.2 C)] 98.1 F (36.7 C) (08/27 0930) Pulse Rate:  [73-88] 82 (08/27 0930) Resp:  [15-20] 19 (08/27 0930) BP: (98-131)/(47-85) 111/47 (08/27 0930) SpO2:  [88 %-100 %] 91 % (08/27 0930) Weight:  [81.6 kg] 81.6 kg (08/26 1805)  Weight change:  Filed Weights   02/10/23 1805  Weight: 81.6 kg    Intake/Output: I/O last 3 completed shifts: In: 1874.2 [I.V.:175.9; IV Piggyback:1698.3] Out: 1900 [Urine:1900]   Intake/Output this shift:  No intake/output data recorded.  Physical Exam: General: NAD  Head: Normocephalic, atraumatic. Moist oral mucosal membranes  Eyes: Anicteric  Lungs:  Clear to auscultation, normal effort  Heart: Regular rate and rhythm  Abdomen:  Soft, nontender,   Extremities:  No peripheral edema.  Neurologic: Nonfocal, moving all four extremities  Skin: No lesions  Access: None    Basic Metabolic Panel: Recent Labs  Lab 02/05/23 0550 02/10/23 0906 02/10/23 1022 02/10/23 1315 02/10/23 1500 02/10/23 2009 02/11/23 0457  NA 131* 120* 120*  --  122* 125* 126*  K 4.3 5.0 5.7*  --   --  5.3* 5.4*  CL 95* 89* 88*  --   --  95* 98  CO2 28 19* 21*  --   --  19* 18*  GLUCOSE 116* 93 75  --   --  82 92  BUN 16 54* 58*  --   --  59* 59*  CREATININE 0.93 5.30* 5.27*  --   --  5.34* 5.30*  CALCIUM 11.7* 8.3* 8.4*  --   --  7.8* 7.7*  PHOS  --   --   --  6.4*  --   --  6.8*    Liver Function Tests: Recent Labs  Lab 02/10/23 0906  AST 18  ALT 16  ALKPHOS 60  BILITOT 0.3  PROT 8.2*  ALBUMIN 2.1*   No results for input(s): "LIPASE", "AMYLASE" in the last 168 hours. No results for input(s): "AMMONIA" in the last 168 hours.  CBC: Recent Labs  Lab 02/05/23 0550 02/10/23 0906  WBC 10.2 14.3*   NEUTROABS 7.9* 10.4*  HGB 9.0* 8.4*  HCT 27.2* 25.2*  MCV 94.4 94.4  PLT 458* 439*    Cardiac Enzymes: Recent Labs  Lab 02/10/23 1500  CKTOTAL 119    BNP: Invalid input(s): "POCBNP"  CBG: No results for input(s): "GLUCAP" in the last 168 hours.  Microbiology: Results for orders placed or performed during the hospital encounter of 02/10/23  Surgical PCR screen     Status: None   Collection Time: 02/10/23  7:09 PM   Specimen: Nasal Mucosa; Nasal Swab  Result Value Ref Range Status   MRSA, PCR NEGATIVE NEGATIVE Final   Staphylococcus aureus NEGATIVE NEGATIVE Final    Comment: (NOTE) The Xpert SA Assay (FDA approved for NASAL specimens in patients 81 years of age and older), is one component of a comprehensive surveillance program. It is not intended to diagnose infection nor to guide or monitor treatment. Performed at [facility], [address] [place], [place] [address]     Coagulation Studies: No results for input(s): "LABPROT", "INR" in the last 72 hours.  Urinalysis: Recent Labs    02/10/23 1500  COLORURINE STRAW*  LABSPEC 1.006  PHURINE 5.0  GLUCOSEU NEGATIVE  HGBUR NEGATIVE  BILIRUBINUR NEGATIVE  KETONESUR NEGATIVE  PROTEINUR NEGATIVE  NITRITE NEGATIVE  LEUKOCYTESUR NEGATIVE      Imaging: US RENAL  Result Date: 02/10/2023 CLINICAL DATA:  AKI EXAM: RENAL / URINARY TRACT ULTRASOUND COMPLETE COMPARISON:  None Available. FINDINGS: Right Kidney: Renal measurements: 12.5 cm x 5.6 cm x 4.6 cm = volume: 165 mL. Parenchymal echogenicity is normal. There is no hydronephrosis. Left Kidney: Renal measurements: 12.6 cm x 6.4 cm x 5.0 cm = volume: 213 mL. Parenchymal echogenicity is normal. There is no hydronephrosis. Bladder: Decompressed by a Foley catheter. Other: None. IMPRESSION: Unremarkable renal ultrasound. Electronically Signed   By: Lesia Hausen M.D.   On: 02/10/2023 17:03   DG Chest 1 View  Result Date: 02/10/2023 CLINICAL DATA:  Acute  kidney injury. Sodium 120. Elevated creatinine. EXAM: CHEST  1 VIEW COMPARISON:  02/01/2023 FINDINGS: Shallow inspiration. Cardiac enlargement. Hazy interstitial infiltrates throughout both lungs most likely representing edema although possibly interstitial pneumonia or chronic fibrosis. Small left pleural effusion with basilar atelectasis or consolidation. This could indicate pneumonia in the appropriate clinical setting. No pneumothorax. Postoperative changes in the thoracic spine. No significant change since prior study. IMPRESSION: Cardiac enlargement with probable interstitial pulmonary edema. Small left pleural effusion with basilar atelectasis or consolidation. Similar appearance to previous study. Electronically Signed   By: Burman Nieves M.D.   On: 02/10/2023 15:50     Medications:    sodium bicarbonate 150 mEq in dextrose 5 % 1,150 mL infusion 75 mL/hr at 02/11/23 1308    bisacodyl  10 mg Oral Daily   Chlorhexidine Gluconate Cloth  6 each Topical Daily   clonazePAM  0.5 mg Oral BID   dexamethasone  40 mg Oral Daily   docusate sodium  100 mg Oral BID   escitalopram  10 mg Oral Daily   fentaNYL  1 patch Transdermal Q72H   gabapentin  100 mg Oral BID   heparin  5,000 Units Subcutaneous Q12H   levothyroxine  250 mcg Oral QAC breakfast   loratadine  10 mg Oral Daily   mometasone-formoterol  2 puff Inhalation BID   mupirocin ointment  1 Application Nasal BID   pantoprazole  40 mg Oral Daily   polyethylene glycol  17 g Oral BID   umeclidinium bromide  1 puff Inhalation Daily   albuterol, cyclobenzaprine, HYDROmorphone (DILAUDID) injection, oxyCODONE, zolpidem  Assessment/ Plan:  [name] is a 57 y.o.  female with past medical history hypertension, depression, anxiety, tobacco, and recent diagnosis of multiple myeloma, who was admitted to [facility] on 02/10/2023 for AKI (acute kidney injury) (HCC) [N17.9]   Acute kidney injury appears to multifactorial. Admits to NSAID use and  recent diagnosis of multiple myeloma. Awaiting additional workup ordered by primary team. Renal ultrasound ordered. Bladder scan with recorded. Will order a foley catheter to relieve retention and monitor I/Os.  Creatinine remains elevated today with 1.9L in urine produced in previous 24 hours. Denies uremic symptoms. No acute need for dialysis. Continue IVF  Lab Results  Component Value Date   CREATININE 5.30 (H) 02/11/2023   CREATININE 5.34 (H) 02/10/2023   CREATININE 5.27 (H) 02/10/2023    Intake/Output Summary (Last 24 hours) at 02/11/2023 0952 Last data filed at 02/11/2023 0415 Gross per 24 hour  Intake 1874.17 ml  Output 1900 ml  Net -25.83 ml   2. Hyperkalemia, likely secondary to kidney injury. Potassium 5.4, continue treating with shifting measures and Lokelma  3. Acute metabolic acidosis, S bicarb 19 on admission. Slowly correcting with sodium bicarbonate infusion.    4. Multiple myeloma diagnosed with bone marrow biopsy on 02/05/23. Following Dr Smith Robert outpatient.     LOS: 1   8/27/20249:52 AM

## 2023-02-11 NOTE — Consult Note (Signed)
Hematology/Oncology Consult note [facility] Telephone:([phone] [phone]  Patient Care Team: Smitty Cords, DO as PCP - General (Family Medicine) Lemar Livings, Merrily Pew, MD (General Surgery) Shelia Media, MD (Internal Medicine) Creig Hines, MD as Consulting Physician (Oncology)   Name of the patient: Paula Garrett  [id number]  1966/03/06    Reason for consult: Newly diagnosed multiple myeloma presenting with acute kidney injury   Requesting physician: Dr. 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  Biopsy at that time revealed plasma cell neoplasm.  Plan was to start treatment for her IgG lambdaMultiple myeloma in about 2 weeks from now after wound healing for her back surgery is completed.  She was discharged from the hospital last week and her renal functions were normal.  She was seen by me in the clinic yesterday and her creatinine was found to be 5 with significant hyponatremia.  She was given 2 L of fluids in the ER and is presently on 125 cc an hour of fluids.  Still reports some ongoing back pain.  Denies any use of NSAIDs after she was discharged from her recent hospitalization.  ECOG PS- 1  Pain scale- 3   Review of systems- Review of Systems  Constitutional:  Positive for malaise/fatigue. Negative for chills, fever and weight loss.  HENT:  Negative for congestion, ear discharge and nosebleeds.   Eyes:  Negative for blurred vision.  Respiratory:  Negative for cough, hemoptysis, sputum production, shortness of breath and wheezing.   Cardiovascular:  Negative for chest pain, palpitations, orthopnea and claudication.  Gastrointestinal:  Positive for constipation. Negative for  abdominal pain, blood in stool, diarrhea, heartburn, melena, nausea and vomiting.  Genitourinary:  Negative for dysuria, flank pain, frequency, hematuria and urgency.  Musculoskeletal:  Positive for back pain. Negative for joint pain and myalgias.  Skin:  Negative for rash.  Neurological:  Negative for dizziness, tingling, focal weakness, seizures, weakness and headaches.  Endo/Heme/Allergies:  Does not bruise/bleed easily.  Psychiatric/Behavioral:  Negative for depression and suicidal ideas. The patient does not have insomnia.     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   MDD (major depressive disorder), recurrent episode, moderate (HCC) 11/20/2022   SOB (shortness of breath) 11/01/2022   Chest pain 10/07/2022   Anxiety 10/07/2022   Iron deficiency anemia 12/14/2021   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   Goals of care, counseling/discussion 07/24/2020   Hypercalcemia 05/05/2020   Traumatic seroma of lower leg, right, subsequent encounter 05/05/2020   Traumatic hematoma of lower leg, right, subsequent encounter 02/09/2020   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   Colon polyps 07/26/2019   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   Prediabetes 09/10/2018   OSA (obstructive sleep  apnea) 01/09/2018   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   Allergic rhinitis 11/20/2017   Hot flashes 11/20/2017   Asthma 07/26/2017   B12 deficiency 07/26/2017    Snoring 05/01/2016   Rheumatoid factor positive 10/24/2015   Annual physical exam 01/12/2015   Chronic fatigue 06/23/2014   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   Barrett's esophagus without dysplasia 05/27/2014   History of adenomatous polyp of colon 05/27/2014   Anemia 01/28/2014   Morbid obesity (HCC) 01/28/2014   Low back pain 07/21/2013   Benign essential hypertension 01/20/2013   Vitamin D deficiency 12/02/2012   Chronic left shoulder pain 12/02/2012   Tobacco use disorder 06/09/2012   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   Insomnia 04/21/2012   Hypothyroidism 04/21/2012   Obesity (BMI 30-39.9) 04/21/2012   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     Past Medical History:  Diagnosis Date   Anxiety    Arthritis    Asthma    Barrett's esophagus 2015   COVID-19    03/10/20   Depression    GERD (gastroesophageal reflux disease)    Hypertension    Hypothyroidism    Pneumonia    Smoldering myeloma    Thyroid disease      Past Surgical History:  Procedure Laterality Date   ABLATION  2006   APPLICATION OF INTRAOPERATIVE CT SCAN N/A 01/28/2023   Procedure: APPLICATION OF INTRAOPERATIVE CT SCAN;  Surgeon: Loreen Freud, MD;  Location: [facility] ORS;  Service: Neurosurgery;  Laterality: N/A;   BREAST CYST ASPIRATION Left 1998   BREAST SURGERY     cyst aspiration   BREAST SURGERY  1998   cyst aspiration   COLONOSCOPY  03/2014   COLONOSCOPY WITH PROPOFOL N/A 01/06/2020   Procedure: COLONOSCOPY WITH PROPOFOL;  Surgeon: Toledo, Boykin Nearing, MD;  Location: [facility] ENDOSCOPY;  Service: Gastroenterology;  Laterality: N/A;   ENDOMETRIAL ABLATION     ESOPHAGOGASTRODUODENOSCOPY (EGD) WITH PROPOFOL N/A 08/15/2015   Procedure: ESOPHAGOGASTRODUODENOSCOPY (EGD) WITH PROPOFOL;  Surgeon: Christena Deem, MD;  Location: [facility] ENDOSCOPY;  Service: Endoscopy;  Laterality: N/A;   ESOPHAGOGASTRODUODENOSCOPY (EGD) WITH PROPOFOL N/A 01/22/2016   Procedure: ESOPHAGOGASTRODUODENOSCOPY (EGD) WITH  PROPOFOL;  Surgeon: Christena Deem, MD;  Location: [facility] ENDOSCOPY;  Service: Endoscopy;  Laterality: N/A;   ESOPHAGOGASTRODUODENOSCOPY (EGD) WITH PROPOFOL N/A 01/06/2020   Procedure: ESOPHAGOGASTRODUODENOSCOPY (EGD) WITH PROPOFOL;  Surgeon: Toledo, Boykin Nearing, MD;  Location: [facility] ENDOSCOPY;  Service: Gastroenterology;  Laterality: N/A;   GASTRIC BYPASS  2005   HERNIA REPAIR     IR BONE MARROW BIOPSY & ASPIRATION  02/05/2023   NASAL SINUS SURGERY     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   TOTAL THYROIDECTOMY     TUBAL LIGATION      Social History   Socioeconomic History   Marital status: Divorced    Spouse name: Not on file   Number of children: 2   Years of education: Not on file   Highest education level: GED or equivalent  Occupational History   Occupation: Part-time  Tobacco Use   Smoking status: Every Day    Current packs/day: 1.00    Average packs/day: 1 pack/day for 42.9 years (42.9 ttl pk-yrs)    Types: Cigarettes    Start date: 03/15/1980   Smokeless tobacco: Never  Vaping Use   Vaping status: Never Used  Substance and Sexual Activity   Alcohol use: Yes    Alcohol/week: 10.0 - 12.0 standard drinks of alcohol    Types: 8 - 10 Glasses of  wine, 2 Standard drinks or equivalent per week   Drug use: No   Sexual activity: Yes    Partners: Male  Other Topics Concern   Not on file  Social History Narrative   Lives in [place] single, divorced       Work - 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       Diet - healthy diet   Exercise - limited   Caffeine use: daily   Social Determinants of Health   Financial Resource Strain: Low Risk  (11/01/2022)   Overall Financial Resource Strain (CARDIA)    Difficulty of Paying Living Expenses: Not very hard  Food Insecurity: No Food Insecurity (02/10/2023)   Hunger Vital Sign    Worried About Running Out of Food in the Last Year: Never true    Ran Out of Food in the Last Year: Never true  Transportation Needs: No  Transportation Needs (02/10/2023)   PRAPARE - Administrator, Civil Service (Medical): No    Lack of Transportation (Non-Medical): No  Physical Activity: Insufficiently Active (11/01/2022)   Exercise Vital Sign    Days of Exercise per Week: 3 days    Minutes of Exercise per Session: 20 min  Stress: Stress Concern Present (11/01/2022)   [employer] of Occupational Health - Occupational Stress Questionnaire    Feeling of Stress : Very much  Social Connections: Socially Isolated (11/01/2022)   Social Connection and Isolation Panel [NHANES]    Frequency of Communication with Friends and Family: More than three times a week    Frequency of Social Gatherings with Friends and Family: Once a week    Attends Religious Services: Never    Database administrator or Organizations: No    Attends Engineer, structural: Not on file    Marital Status: Divorced  Intimate Partner Violence: Not At Risk (02/10/2023)   Humiliation, Afraid, Rape, and Kick questionnaire    Fear of Current or Ex-Partner: No    Emotionally Abused: No    Physically Abused: No    Sexually Abused: No     Family History  Problem Relation Age of Onset   Heart disease Mother    COPD Mother    Arthritis Mother    Cancer Mother        uterine   Lupus Mother    Anxiety disorder Mother    Depression Mother    Drug abuse Mother    COPD Father    Arthritis Father    Heart disease Father    Heart attack Father    Alcohol abuse Father    Heart disease Brother    Heart attack Brother    Drug abuse Brother    Anxiety disorder Brother    Depression Brother    Heart disease Brother    Cancer Brother        liver cancer age 19 y.o   Diabetes Maternal Grandmother    Diabetes Maternal Grandfather    Diabetes Paternal Grandmother    Diabetes Paternal Grandfather    Breast cancer Neg Hx      Current Facility-Administered Medications:    albuterol (PROVENTIL) (2.5 MG/3ML) 0.083% nebulizer solution 3 mL,  3 mL, Inhalation, Q6H PRN, Mikey College T, MD   bisacodyl (DULCOLAX) EC tablet 10 mg, 10 mg, Oral, Daily, Enedina Finner, MD, 10 mg at 02/11/23 0855   Chlorhexidine Gluconate Cloth 2 % PADS 6 each, 6 each, Topical, Daily, Enedina Finner, MD, 6 each at 02/11/23 1004  clonazePAM (KLONOPIN) tablet 0.5 mg, 0.5 mg, Oral, BID, Mikey College T, MD, 0.5 mg at 02/11/23 7371   cyclobenzaprine (FLEXERIL) tablet 5-10 mg, 5-10 mg, Oral, TID PRN, Emeline General, MD   dexamethasone (DECADRON) tablet 40 mg, 40 mg, Oral, Daily, Creig Hines, MD, 40 mg at 02/11/23 0626   docusate sodium (COLACE) capsule 100 mg, 100 mg, Oral, BID, Mikey College T, MD, 100 mg at 02/11/23 9485   escitalopram (LEXAPRO) tablet 10 mg, 10 mg, Oral, Daily, Mikey College T, MD, 10 mg at 02/11/23 4627   fentaNYL (DURAGESIC) 12 MCG/HR 1 patch, 1 patch, Transdermal, Q72H, Enedina Finner, MD, 1 patch at 02/11/23 1004   gabapentin (NEURONTIN) capsule 100 mg, 100 mg, Oral, BID, Mikey College T, MD, 100 mg at 02/11/23 0350   heparin injection 5,000 Units, 5,000 Units, Subcutaneous, Q12H, Mikey College T, MD, 5,000 Units at 02/11/23 0938   HYDROmorphone (DILAUDID) injection 0.5-1 mg, 0.5-1 mg, Intravenous, Q2H PRN, Mikey College T, MD, 1 mg at 02/11/23 0830   levothyroxine (SYNTHROID) tablet 250 mcg, 250 mcg, Oral, QAC breakfast, Mikey College T, MD, 250 mcg at 02/11/23 0506   loratadine (CLARITIN) tablet 10 mg, 10 mg, Oral, Daily, Mikey College T, MD, 10 mg at 02/11/23 1829   mometasone-formoterol (DULERA) 200-5 MCG/ACT inhaler 2 puff, 2 puff, Inhalation, BID, Mikey College T, MD, 2 puff at 02/11/23 0854   mupirocin ointment (BACTROBAN) 2 % 1 Application, 1 Application, Nasal, BID, Mikey College T, MD, 1 Application at 02/11/23 [phone]   oxyCODONE (Oxy IR/ROXICODONE) immediate release tablet 5 mg, 5 mg, Oral, Q4H PRN, Enedina Finner, MD   pantoprazole (PROTONIX) EC tablet 40 mg, 40 mg, Oral, Daily, Mikey College T, MD, 40 mg at 02/11/23 6967   polyethylene glycol (MIRALAX / GLYCOLAX)  packet 17 g, 17 g, Oral, BID, Mikey College T, MD, 17 g at 02/11/23 8938   sodium bicarbonate 150 mEq in dextrose 5 % 1,150 mL infusion, , Intravenous, Continuous, Kolluru, Sarath, MD, Last Rate: 75 mL/hr at 02/11/23 0939, New Bag at 02/11/23 0939   umeclidinium bromide (INCRUSE ELLIPTA) 62.5 MCG/ACT 1 puff, 1 puff, Inhalation, Daily, Mikey College T, MD, 1 puff at 02/11/23 0854   zolpidem (AMBIEN) tablet 5-10 mg, 5-10 mg, Oral, QHS PRN, Emeline General, MD   Physical exam:  Vitals:   02/10/23 2037 02/10/23 2342 02/11/23 0416 02/11/23 0930  BP: 101/70 110/68 (!) 103/53 (!) 111/47  Pulse: 78 79 81 82  Resp: 20 18 20 19   Temp: 98.9 F (37.2 C) 97.7 F (36.5 C) 98.3 F (36.8 C) 98.1 F (36.7 C)  TempSrc:    Oral  SpO2: 93% 90% (!) 88% 91%  Weight:      Height:       Physical Exam Cardiovascular:     Rate and Rhythm: Normal rate and regular rhythm.     Heart sounds: Normal heart sounds.  Pulmonary:     Effort: Pulmonary effort is normal.     Breath sounds: Normal breath sounds.  Abdominal:     General: Bowel sounds are normal.     Palpations: Abdomen is soft.  Musculoskeletal:     Right lower leg: No edema.     Left lower leg: No edema.  Skin:    General: Skin is warm and dry.  Neurological:     Mental Status: She is alert and oriented to person, place, and time.           Latest Ref Rng &  Units 02/11/2023    4:57 AM  CMP  Glucose 70 - 99 mg/dL 92   BUN 6 - 20 mg/dL 59   Creatinine 7.82 - 1.00 mg/dL 9.56   Sodium 213 - 086 mmol/L 126   Potassium 3.5 - 5.1 mmol/L 5.4   Chloride 98 - 111 mmol/L 98   CO2 22 - 32 mmol/L 18   Calcium 8.9 - 10.3 mg/dL 7.7       Latest Ref Rng & Units 02/10/2023    9:06 AM  CBC  WBC 4.0 - 10.5 K/uL 14.3   Hemoglobin 12.0 - 15.0 g/dL 8.4   Hematocrit 57.8 - 46.0 % 25.2   Platelets 150 - 400 K/uL 439     @IMAGES @  US RENAL  Result Date: 02/10/2023 CLINICAL DATA:  AKI EXAM: RENAL / URINARY TRACT ULTRASOUND COMPLETE COMPARISON:  None  Available. FINDINGS: Right Kidney: Renal measurements: 12.5 cm x 5.6 cm x 4.6 cm = volume: 165 mL. Parenchymal echogenicity is normal. There is no hydronephrosis. Left Kidney: Renal measurements: 12.6 cm x 6.4 cm x 5.0 cm = volume: 213 mL. Parenchymal echogenicity is normal. There is no hydronephrosis. Bladder: Decompressed by a Foley catheter. Other: None. IMPRESSION: Unremarkable renal ultrasound. Electronically Signed   By: Lesia Hausen M.D.   On: 02/10/2023 17:03   DG Chest 1 View  Result Date: 02/10/2023 CLINICAL DATA:  Acute kidney injury. Sodium 120. Elevated creatinine. EXAM: CHEST  1 VIEW COMPARISON:  02/01/2023 FINDINGS: Shallow inspiration. Cardiac enlargement. Hazy interstitial infiltrates throughout both lungs most likely representing edema although possibly interstitial pneumonia or chronic fibrosis. Small left pleural effusion with basilar atelectasis or consolidation. This could indicate pneumonia in the appropriate clinical setting. No pneumothorax. Postoperative changes in the thoracic spine. No significant change since prior study. IMPRESSION: Cardiac enlargement with probable interstitial pulmonary edema. Small left pleural effusion with basilar atelectasis or consolidation. Similar appearance to previous study. Electronically Signed   By: Burman Nieves M.D.   On: 02/10/2023 15:50   IR BONE MARROW BIOPSY & ASPIRATION  Result Date: 02/05/2023 INDICATION: Hematologic abnormality EXAM: Bone marrow aspiration and core biopsy using fluoroscopic guidance MEDICATIONS: None. ANESTHESIA/SEDATION: Moderate (conscious) sedation was employed during this procedure. A total of Versed 1.5 mg and Fentanyl 75 mcg was administered intravenously. Moderate Sedation Time: 10 minutes. The patient's level of consciousness and vital signs were monitored continuously by radiology nursing throughout the procedure under my direct supervision. FLUOROSCOPY TIME:  Fluoroscopy Time: 0.7 minutes (13 mGy)  COMPLICATIONS: None immediate. PROCEDURE: Informed written consent was obtained from the patient after a thorough discussion of the procedural risks, benefits and alternatives. All questions were addressed. Maximal Sterile Barrier Technique was utilized including caps, mask, sterile gowns, sterile gloves, sterile drape, hand hygiene and skin antiseptic. A timeout was performed prior to the initiation of the procedure. The patient was placed prone on the exam table. Limited fluoroscopy of the pelvis was performed for planning purposes. Skin entry site was marked, and the overlying skin was prepped and draped in the standard sterile fashion. Local analgesia was obtained with 1% lidocaine. Using fluoroscopic guidance, an 11 gauge needle was advanced just deep to the cortex of the right posterior ilium. Subsequently, bone marrow aspiration and core biopsy were performed. Specimens were submitted to lab/pathology for handling. Hemostasis was achieved with manual pressure, and a clean dressing was placed. The patient tolerated the procedure well without immediate complication. IMPRESSION: Successful bone marrow aspiration and core biopsy of the right posterior ilium. Electronically  Signed   By: Olive Bass M.D.   On: 02/05/2023 15:35   CT HEAD WO CONTRAST ( )  Result Date: 02/03/2023 CLINICAL DATA:  Mental status change, unknown cause. Acute hypoxic respiratory failure EXAM: CT HEAD WITHOUT CONTRAST TECHNIQUE: Contiguous axial images were obtained from the base of the skull through the vertex without intravenous contrast. RADIATION DOSE REDUCTION: This exam was performed according to the departmental dose-optimization program which includes automated exposure control, adjustment of the mA and/or kV according to patient size and/or use of iterative reconstruction technique. COMPARISON:  Bone survey 10/09/2022. FINDINGS: Brain: No acute intracranial abnormality. Specifically, no hemorrhage, hydrocephalus, mass  lesion, acute infarction, or significant intracranial injury. Vascular: No hyperdense vessel or unexpected calcification. Skull: Focal lucent lesions throughout the skull. 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 These lytic lesions were not in the skull on the skull x-ray from 10/09/2022. Appearance is concerning for possible multiple myeloma. No fracture. Sinuses/Orbits: No acute findings Other: None IMPRESSION: No acute intracranial abnormality. Numerous scattered focal lucent lesions throughout the skull. These are new since prior bone survey from 10/09/2022 and concerning for multiple myeloma. Electronically Signed   By: Charlett Nose M.D.   On: 02/03/2023 16:30   DG Chest Port 1 View  Result Date: 02/01/2023 CLINICAL DATA:  Hypoxia EXAM: PORTABLE CHEST 1 VIEW COMPARISON:  01/30/2023 FINDINGS: Bilateral diffuse interstitial thickening and patchy alveolar airspace opacities at the right lung base and left mid lower lung. No pleural effusion or pneumothorax. Stable cardiomediastinal silhouette. No acute osseous abnormality. Posterior spinal fusion of the upper and midthoracic spine. IMPRESSION: 1. Bilateral diffuse interstitial thickening and patchy alveolar airspace opacities at the right lung base and left mid lower lung concerning for pulmonary edema. Electronically Signed   By: Elige Ko M.D.   On: 02/01/2023 10:13   ECHOCARDIOGRAM COMPLETE  Result Date: 01/31/2023    ECHOCARDIOGRAM REPORT   Patient Name:   Paula Garrett Date of Exam: 01/31/2023 Medical Rec #:  [id number]      Height:       62.0 in Accession #:    [id number]     Weight:       170.0 lb Date of Birth:  1965/11/07      BSA:          1.784 m Patient Age:    57 years       BP:           106/57 mmHg Patient Gender: F              HR:           110 bpm. Exam Location:  [facility] Procedure: 2D Echo, Cardiac Doppler and Color Doppler Indications:     Dyspnea  History:         Patient has no prior history of  Echocardiogram examinations.                  Signs/Symptoms:Dyspnea, Chest Pain, Fatigue and Shortness of                  Breath; Risk Factors:Hypertension, Sleep Apnea and Current                  Smoker.  Sonographer:     Mikki Harbor Referring Phys:  [id number] Alford Highland Diagnosing Phys: Julien Nordmann MD  Sonographer Comments: Image acquisition challenging due to respiratory motion. IMPRESSIONS  1. Left ventricular ejection fraction, by estimation, is 60 to  65%. The left ventricle has normal function. The left ventricle has no regional wall motion abnormalities. There is mild left ventricular hypertrophy. Left ventricular diastolic parameters are consistent with Grade I diastolic dysfunction (impaired relaxation).  2. Right ventricular systolic function is normal. The right ventricular size is normal. There is normal pulmonary artery systolic pressure. The estimated right ventricular systolic pressure is 30.9 mmHg.  3. Left atrial size was mildly dilated.  4. The mitral valve is normal in structure. No evidence of mitral valve regurgitation. No evidence of mitral stenosis.  5. The aortic valve is tricuspid. Aortic valve regurgitation is not visualized. No aortic stenosis is present.  6. The inferior vena cava is normal in size with greater than 50% respiratory variability, suggesting right atrial pressure of 3 mmHg. FINDINGS  Left Ventricle: Left ventricular ejection fraction, by estimation, is 60 to 65%. The left ventricle has normal function. The left ventricle has no regional wall motion abnormalities. The left ventricular internal cavity size was normal in size. There is  mild left ventricular hypertrophy. Left ventricular diastolic parameters are consistent with Grade I diastolic dysfunction (impaired relaxation). Right Ventricle: The right ventricular size is normal. No increase in right ventricular wall thickness. Right ventricular systolic function is normal. There is normal pulmonary artery  systolic pressure. The tricuspid regurgitant velocity is 2.64 m/s, and  with an assumed right atrial pressure of 3 mmHg, the estimated right ventricular systolic pressure is 30.9 mmHg. Left Atrium: Left atrial size was mildly dilated. Right Atrium: Right atrial size was normal in size. Pericardium: There is no evidence of pericardial effusion. Mitral Valve: The mitral valve is normal in structure. No evidence of mitral valve regurgitation. No evidence of mitral valve stenosis. MV peak gradient, 8.0 mmHg. The mean mitral valve gradient is 4.0 mmHg. Tricuspid Valve: The tricuspid valve is normal in structure. Tricuspid valve regurgitation is mild . No evidence of tricuspid stenosis. Aortic Valve: The aortic valve is tricuspid. Aortic valve regurgitation is not visualized. No aortic stenosis is present. Aortic valve mean gradient measures 9.0 mmHg. Aortic valve peak gradient measures 18.4 mmHg. Aortic valve area, by VTI measures 3.04  cm. Pulmonic Valve: The pulmonic valve was normal in structure. Pulmonic valve regurgitation is not visualized. No evidence of pulmonic stenosis. Aorta: The aortic root is normal in size and structure. Venous: The inferior vena cava is normal in size with greater than 50% respiratory variability, suggesting right atrial pressure of 3 mmHg. IAS/Shunts: No atrial level shunt detected by color flow Doppler.  LEFT VENTRICLE PLAX 2D LVIDd:         5.80 cm   Diastology LVIDs:         3.60 cm   LV e' medial:    9.36 cm/s LV PW:         0.90 cm   LV E/e' medial:  9.7 LV IVS:        1.00 cm   LV e' lateral:   10.00 cm/s LVOT diam:     2.20 cm   LV E/e' lateral: 9.1 LV SV:         99 LV SV Index:   55 LVOT Area:     3.80 cm  RIGHT VENTRICLE RV Basal diam:  3.45 cm RV Mid diam:    3.00 cm RV S prime:     27.40 cm/s TAPSE (M-mode): 3.3 cm LEFT ATRIUM             Index  RIGHT ATRIUM           Index LA diam:        4.80 cm 2.69 cm/m   RA Area:     18.90 cm LA Vol (A2C):   59.6 ml 33.41 ml/m   RA Volume:   54.10 ml  30.32 ml/m LA Vol (A4C):   66.9 ml 37.50 ml/m LA Biplane Vol: 65.0 ml 36.43 ml/m  AORTIC VALVE AV Area (Vmax):    3.09 cm AV Area (Vmean):   3.04 cm AV Area (VTI):     3.04 cm AV Vmax:           214.50 cm/s AV Vmean:          136.750 cm/s AV VTI:            0.326 m AV Peak Grad:      18.4 mmHg AV Mean Grad:      9.0 mmHg LVOT Vmax:         174.50 cm/s LVOT Vmean:        109.500 cm/s LVOT VTI:          0.260 m LVOT/AV VTI ratio: 0.80  AORTA Ao Root diam: 3.20 cm MITRAL VALVE                TRICUSPID VALVE MV Area (PHT): 3.72 cm     TR Peak grad:   27.9 mmHg MV Area VTI:   2.90 cm     TR Vmax:        264.00 cm/s MV Peak grad:  8.0 mmHg MV Mean grad:  4.0 mmHg     SHUNTS MV Vmax:       1.41 m/s     Systemic VTI:  0.26 m MV Vmean:      91.6 cm/s    Systemic Diam: 2.20 cm MV Decel Time: 204 msec MV E velocity: 90.70 cm/s MV A velocity: 117.00 cm/s MV E/A ratio:  0.78 Julien Nordmann MD Electronically signed by Julien Nordmann MD Signature Date/Time: 01/31/2023/5:43:31 PM    Final    DG Thoracic Spine 2 View  Result Date: 01/31/2023 CLINICAL DATA:  Back pain. 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 EXAM: THORACIC SPINE 2 VIEWS COMPARISON:  MRI of the cervicothoracic spine 01/24/2023. Intraoperative imaging of the thoracic spine 01/28/2023. Chest CTA 01/24/2023. 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 Counting superiorly from the T12 level, the patient has undergone midthoracic spinal decompression with posterior pedicle screw and rod fusion from T4 through T9. The hardware appears intact and well positioned. Chronic superior endplate compression deformity at T11 is unchanged. No acute osseous complications are identified. There is mild atelectasis at both lung bases. IMPRESSION: No demonstrated acute findings status post recent midthoracic spinal decompression and fusion. Electronically Signed   By: Carey Bullocks M.D.   On: 01/31/2023 16:27   DG Chest Port 1 View  Result Date: 01/30/2023 CLINICAL DATA:  Cough, shortness of breath EXAM: PORTABLE CHEST 1 VIEW COMPARISON:  01/24/2023 FINDINGS: Cardiomegaly. Diffuse bilateral interstitial pulmonary opacity and trace pleural effusions. Interval thoracic fusion. IMPRESSION: Cardiomegaly with diffuse bilateral interstitial pulmonary opacity and trace pleural effusions, most consistent with edema. Electronically Signed   By: Jearld Lesch M.D.   On: 01/30/2023 14:07   DG Thoracic Spine 2 View  Result Date: 01/28/2023 CLINICAL DATA:  Elective surgery. T4 through T9 fusion of biopsy for lesion with intra 3D imaging. EXAM: THORACIC  SPINE 2 VIEWS COMPARISON:  Preoperative imaging. FINDINGS: Intraoperative 3D imaging of the thoracic spine obtained. Posterior rod with pedicle screws at multiple levels. Known paravertebral mass better assessed on preoperative imaging. No fluoroscopic spot views provided. Fluoroscopy time 1 minutes 30 seconds. Dose is 97.045 mGy IMPRESSION: Intraoperative 3D imaging of the thoracic spine. Electronically Signed   By: Narda Rutherford M.D.   On: 01/28/2023 16:28   DG C-Arm 1-60 Min-No Report  Result Date: 01/28/2023 Fluoroscopy was utilized by the requesting physician.  No radiographic interpretation.   DG C-Arm 1-60 Min-No Report  Result Date: 01/28/2023 Fluoroscopy was utilized by the requesting physician.  No radiographic interpretation.   DG C-Arm 1-60 Min-No Report  Result Date: 01/28/2023 Fluoroscopy was utilized by the requesting physician.  No radiographic interpretation.   DG C-Arm 1-60 Min-No Report  Result Date: 01/28/2023 Fluoroscopy was utilized by the requesting physician.  No radiographic interpretation.   MR THORACIC SPINE W WO CONTRAST  Result Date: 01/24/2023 CLINICAL DATA:  Soft tissue mass with bony destruction involving the left seventh rib and T6 and T7 vertebral bodies. EXAM: MRI CERVICAL AND THORACIC  SPINE WITHOUT AND WITH CONTRAST TECHNIQUE: Multiplanar and multiecho pulse sequences of the cervical spine, to include the craniocervical junction and cervicothoracic junction, and the thoracic spine, were obtained without and with intravenous contrast. CONTRAST:  7mL GADAVIST GADOBUTROL 1 MMOL/ML IV SOLN COMPARISON:  Same day CTA chest, cervical and thoracic spine MRI 07/20/2020 FINDINGS: MRI CERVICAL SPINE FINDINGS Alignment: There is trace retrolisthesis of C3 on C4 and trace anterolisthesis of C5 on C6. Vertebrae: Vertebral body heights are preserved. Background marrow signal is normal. A T1 hypointense/T2 hyperintense lesion in the clivus is unchanged since 2022, favored benign. There is no suspicious marrow signal abnormality or marrow edema in the cervical spine. There is no abnormal marrow enhancement in the cervical spine. There is a 6 mm enhancing lesion in the right occipital calvarium which was not seen on the prior cervical spine MRI from 2022; however, this area may not has been included within the field of view. Cord: Normal in signal and morphology. Posterior Fossa, vertebral arteries, paraspinal tissues: The imaged posterior fossa is unremarkable. The vertebral artery flow voids are normal. The paraspinal soft tissues are unremarkable. Disc levels: There is overall mild degenerative change in the cervical spine without significant spinal canal or neural foraminal stenosis. MRI THORACIC SPINE FINDINGS Alignment:  Normal. Vertebrae: Background marrow signal is normal. There is infiltrative T1 hypointensity occupying most of the T6 and T7 vertebral bodies extending into the left pedicles and facets. There is a bulky soft tissue mass in the adjacent left paraspinal/subpleural soft tissues with destruction of the seventh rib. The bulk of the soft tissue component measures up to proximally 7.2 cm x 3.4 cm x 2.7 cm. There is extension into the T6-T7 and T7-T8 neural foramina as well as epidural tumor along  the ventral and left dorsal lateral aspects of the spinal canal resulting in moderate spinal canal stenosis with effacement of the thecal sac but no frank cord compression or cord edema. There is mild loss of vertebral body height at both levels consistent with associated pathologic fractures. There is compression deformity of the T11 vertebral body with up to approximately 30% loss of vertebral body height anteriorly and minimal bony retropulsion. There is faint edema along the superior endplate consistent with acute to subacute chronicity. This fracture may be pathologic. There are probable additional lesions involving multiple additional ribs (for example 25-21,  25-24). The above findings are new since the MRI from 2022. Cord:  There is no cord signal abnormality or abnormal enhancement. Paraspinal and other soft tissues: Unremarkable, aside from the bulky paraspinal tumor described above. Disc levels: There is a small disc protrusion at T10-T11 without significant spinal canal or neural foraminal stenosis. Otherwise, there is overall minimal background degenerative change without significant spinal canal or neural foraminal stenosis. IMPRESSION: 1. Large soft tissue mass in the left paraspinal soft tissues at T6-T7 with destruction of the seventh rib and invasion of the T6 and T7 vertebral bodies with involvement of the left posterior elements as well as epidural tumor in the spinal canal resulting in moderate spinal canal stenosis without frank cord compression or cord edema. 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 2. Acute to subacute compression fracture of the T11 vertebral body with up to approximately 30% loss of vertebral body height and minimal bony retropulsion, possibly pathologic. 3. Small enhancing lesion in the right occipital calvarium, not definitely present on the prior cervical spine from 2022 (though possibly not included within the field  of view on that study), suspicious for an additional metastatic or myeloma lesion. 4. Additional probable lesions involving multiple additional ribs. 5. No acute or suspicious finding in the cervical spine. Electronically Signed   By: Lesia Hausen M.D.   On: 01/24/2023 14:33   MR Cervical Spine W and Wo Contrast  Result Date: 01/24/2023 CLINICAL DATA:  Soft tissue mass with bony destruction involving the left seventh rib and T6 and T7 vertebral bodies. EXAM: MRI CERVICAL AND THORACIC SPINE WITHOUT AND WITH CONTRAST TECHNIQUE: Multiplanar and multiecho pulse sequences of the cervical spine, to include the craniocervical junction and cervicothoracic junction, and the thoracic spine, were obtained without and with intravenous contrast. CONTRAST:  7mL GADAVIST GADOBUTROL 1 MMOL/ML IV SOLN COMPARISON:  Same day CTA chest, cervical and thoracic spine MRI 07/20/2020 FINDINGS: MRI CERVICAL SPINE FINDINGS Alignment: There is trace retrolisthesis of C3 on C4 and trace anterolisthesis of C5 on C6. Vertebrae: Vertebral body heights are preserved. Background marrow signal is normal. A T1 hypointense/T2 hyperintense lesion in the clivus is unchanged since 2022, favored benign. There is no suspicious marrow signal abnormality or marrow edema in the cervical spine. There is no abnormal marrow enhancement in the cervical spine. There is a 6 mm enhancing lesion in the right occipital calvarium which was not seen on the prior cervical spine MRI from 2022; however, this area may not has been included within the field of view. Cord: Normal in signal and morphology. Posterior Fossa, vertebral arteries, paraspinal tissues: The imaged posterior fossa is unremarkable. The vertebral artery flow voids are normal. The paraspinal soft tissues are unremarkable. Disc levels: There is overall mild degenerative change in the cervical spine without significant spinal canal or neural foraminal stenosis. MRI THORACIC SPINE FINDINGS Alignment:   Normal. Vertebrae: Background marrow signal is normal. There is infiltrative T1 hypointensity occupying most of the T6 and T7 vertebral bodies extending into the left pedicles and facets. There is a bulky soft tissue mass in the adjacent left paraspinal/subpleural soft tissues with destruction of the seventh rib. The bulk of the soft tissue component measures up to proximally 7.2 cm x 3.4 cm x 2.7 cm. There is extension into the T6-T7 and T7-T8 neural foramina as well as epidural tumor along the ventral and left dorsal lateral aspects of the spinal canal resulting in moderate spinal canal stenosis with effacement of the thecal  sac but no frank cord compression or cord edema. There is mild loss of vertebral body height at both levels consistent with associated pathologic fractures. There is compression deformity of the T11 vertebral body with up to approximately 30% loss of vertebral body height anteriorly and minimal bony retropulsion. There is faint edema along the superior endplate consistent with acute to subacute chronicity. This fracture may be pathologic. There are probable additional lesions involving multiple additional ribs (for example 25-21, 25-24). The above findings are new since the MRI from 2022. Cord:  There is no cord signal abnormality or abnormal enhancement. Paraspinal and other soft tissues: Unremarkable, aside from the bulky paraspinal tumor described above. Disc levels: There is a small disc protrusion at T10-T11 without significant spinal canal or neural foraminal stenosis. Otherwise, there is overall minimal background degenerative change without significant spinal canal or neural foraminal stenosis. IMPRESSION: 1. Large soft tissue mass in the left paraspinal soft tissues at T6-T7 with destruction of the seventh rib and invasion of the T6 and T7 vertebral bodies with involvement of the left posterior elements as well as epidural tumor in the spinal canal resulting in moderate spinal canal  stenosis without frank cord compression or cord edema. 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 2. Acute to subacute compression fracture of the T11 vertebral body with up to approximately 30% loss of vertebral body height and minimal bony retropulsion, possibly pathologic. 3. Small enhancing lesion in the right occipital calvarium, not definitely present on the prior cervical spine from 2022 (though possibly not included within the field of view on that study), suspicious for an additional metastatic or myeloma lesion. 4. Additional probable lesions involving multiple additional ribs. 5. No acute or suspicious finding in the cervical spine. Electronically Signed   By: Lesia Hausen M.D.   On: 01/24/2023 14:33   CT Angio Chest PE W and/or Wo Contrast  Result Date: 01/24/2023 CLINICAL DATA:  Chest pain and shortness of breath for a month. EXAM: CT ANGIOGRAPHY CHEST WITH CONTRAST TECHNIQUE: Multidetector CT imaging of the chest was performed using the standard protocol during bolus administration of intravenous contrast. Multiplanar CT image reconstructions and MIPs were obtained to evaluate the vascular anatomy. RADIATION DOSE REDUCTION: This exam was performed according to the departmental dose-optimization program which includes automated exposure control, adjustment of the mA and/or kV according to patient size and/or use of iterative reconstruction technique. CONTRAST:  75mL OMNIPAQUE IOHEXOL 350 MG/ML SOLN COMPARISON:  X-ray 01/24/2023 and older.  Previous CT January 2017 FINDINGS: Cardiovascular: The thoracic aorta has a normal course and caliber with minimal calcified plaque. There is a bovine type aortic arch, normal variant. Coronary artery calcifications are seen. Please correlate for other coronary risk factors. Heart is nonenlarged. There is slight wall thickening of the left ventricle. Prominent fat along the intra-atrial septum. Pulmonary  arteries show some slight enlargement centrally. Please correlate for any evidence of pulmonary artery hypertension. There is heterogeneous enhancement of the pulmonary arterial tree limiting evaluation for emboli. No obvious large and central embolus although several areas are nondiagnostic. Mediastinum/Nodes: Surgical changes identified with a moderate hiatal hernia. Small thyroid gland. No specific abnormal lymph node enlargement identified in the axillary region, hilum or mediastinum. There are some small nodes identified in the posterior mediastinum, posterior to the descending thoracic aorta such as series 4, image 86 but not pathologic by size criteria. Lungs/Pleura: There is some linear opacity lung bases likely scar or atelectasis. No consolidation. Breathing motion identified.  No pleural effusion or pneumothorax. 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 Benign hepatic cysts as well. Musculoskeletal: There is large destructive mass involving the posterior aspect of the left seventh rib with extension to involve the central canal of the spine in the associated vertebral bodies of T6 and T7. Cord compression is possible. This mass is estimated in diameter on series 4, image 65 at 8.5 by 4.3 cm. Slight extension along the extrapleural space in the posterior mediastinum as well. There is question of some subtle other lucent lesions identified along the spine but indeterminate. Critical Value/emergent results were called by telephone at the time of interpretation on 01/24/2023 at 8:41 am to provider [facility] , who verbally acknowledged these results. Review of the MIP images confirms the above findings. IMPRESSION: Aggressive soft tissue mass with bone destruction involving the left seventh rib as well as the T6 and T7 vertebral bodies and central canal. A neoplastic process is possible. There also is a possibility of significant cord compression  with canal encroachment along the spine. Recommend further evaluation with spine MRI with and without contrast and a whole-body bone scan. There appears to be some other subtle lucent bone lesions along the spine and sternum. Please correlate for any history of known malignancy including such processes as myeloma or other. Poor opacification of the pulmonary arterial tree limiting evaluation. Several areas are non diagnostic for pulmonary emboli. No obvious large and central embolus. Aortic Atherosclerosis (ICD10-I70.0). Electronically Signed   By: Karen Kays M.D.   On: 01/24/2023 11:47   DG Chest 2 View  Result Date: 01/24/2023 CLINICAL DATA:  Chest pain EXAM: CHEST - 2 VIEW COMPARISON:  Chest x-ray dated January 07, 2023 FINDINGS: The heart size and mediastinal contours are within normal limits. Small hiatal hernia. Both lungs are clear. Pleural-based nodular opacity of the left posterior hemithorax. The visualized skeletal structures are unremarkable. IMPRESSION: Pleural-based nodular opacity of the left posterior hemithorax. Recommend further evaluation with contrast enhanced chest CT. Electronically Signed   By: Allegra Lai M.D.   On: 01/24/2023 09:38    Assessment and plan- Patient is a 57 y.o. female with newly diagnosed IgG lambda multiple myeloma admitted for acute kidney injury  Plan was to start treatment for multiple myeloma in 2 weeks after allowing for some more time for wound healing from her recent back surgery.  However patient now has AKI with a creatinine of 5 which has remained unchanged in the last 24 hours despite IV fluids.  She is making 1900 cc of urine in the last 24 hours.  She is currently getting fluids at 125 cc/h.  She will start pulse dose Decadron 40 mg daily for 4 days.  I plan to start her on CyBorD treatment starting tomorrow.  She will be getting Cytoxan IV weekly along with Velcade day 1, 4 8 and 11.  Since day 4 is a weekend and we are unable to do chemo over the  weekend she will be getting treatment this week and then 2 treatments next week.  After 4 days of pulse dose Decadron she will be getting weekly Decadron.  We are expecting to see improvement in her renal functions after starting treatment along with IV fluids.  Nephrology on board.  Hyperphosphatemia: On PhosLo and nephrology managing.  Hyperuricemia: Uric acid mildly elevated at 7.5Plan I will continue to keep an eye on it.  G6PD Levels are pending.  I am holding off on  any rasburicase at this time.  I am starting her on allopurinol 100 mg p.o. daily  Neoplasm related pain: Given her renal failure we will switch her to a fentanyl patch and instead of as needed oxycodone as needed Dilaudid may be safer.  Palliative care will be seeing her later today as well  Constipation: Possibly secondary to opioid related.  She is presently on MiraLAX and Dulcolax.    Visit Diagnosis 1. Multiple myeloma not having achieved remission (HCC)     Dr. Owens Shark, MD, MPH [facility] [phone] 02/11/2023

## 2023-02-11 NOTE — Progress Notes (Signed)
[facility]  - [place] at [facility]   PATIENT NAME: Paula Garrett    MR#:  [id number]  DATE OF BIRTH:  [birth year]  SUBJECTIVE:  no family at bedside. Patient came in with abnormal chemistries when she was seen at the cancer center yesterday for follow-up regarding initiation of chemotherapy for multiple myeloma. Patient was found to have creatinine of 5.27. Her baseline creatinine was .93 last week. Patient has not had bowel movements since discharge last week. She is in chronic pain. Tearful and would like to get her pain meds.    VITALS:  Blood pressure (!) 125/59, pulse 83, temperature 98.8 F (37.1 C), temperature source Oral, resp. rate 20, height 5\' 2"  (1.575 m), weight 81.6 kg, SpO2 93%.  PHYSICAL EXAMINATION:   GENERAL:  57 y.o.-year-old patient with no acute distress. Obese LUNGS: Normal breath sounds bilaterally, no wheezing CARDIOVASCULAR: S1, S2 normal. No murmur   ABDOMEN: Soft, nontender, nondistended. Bowel sounds present.  EXTREMITIES: No  edema b/l.    NEUROLOGIC: nonfocal  patient is alert and awake SKIN: No obvious rash, lesion, or ulcer.   LABORATORY PANEL:  CBC Recent Labs  Lab 02/10/23 0906  WBC 14.3*  HGB 8.4*  HCT 25.2*  PLT 439*    Chemistries  Recent Labs  Lab 02/10/23 0906 02/10/23 1022 02/11/23 0457  NA 120*   < > 126*  K 5.0   < > 5.4*  CL 89*   < > 98  CO2 19*   < > 18*  GLUCOSE 93   < > 92  BUN 54*   < > 59*  CREATININE 5.30*   < > 5.30*  CALCIUM 8.3*   < > 7.7*  AST 18  --   --   ALT 16  --   --   ALKPHOS 60  --   --   BILITOT 0.3  --   --    < > = values in this interval not displayed.   Cardiac Enzymes No results for input(s): "TROPONINI" in the last 168 hours. RADIOLOGY:  US RENAL  Result Date: 02/10/2023 CLINICAL DATA:  AKI EXAM: RENAL / URINARY TRACT ULTRASOUND COMPLETE COMPARISON:  None Available. FINDINGS: Right Kidney: Renal measurements: 12.5 cm x 5.6 cm x 4.6 cm = volume: 165 mL. Parenchymal  echogenicity is normal. There is no hydronephrosis. Left Kidney: Renal measurements: 12.6 cm x 6.4 cm x 5.0 cm = volume: 213 mL. Parenchymal echogenicity is normal. There is no hydronephrosis. Bladder: Decompressed by a Foley catheter. Other: None. IMPRESSION: Unremarkable renal ultrasound. Electronically Signed   By: Lesia Hausen M.D.   On: 02/10/2023 17:03   DG Chest 1 View  Result Date: 02/10/2023 CLINICAL DATA:  Acute kidney injury. Sodium 120. Elevated creatinine. EXAM: CHEST  1 VIEW COMPARISON:  02/01/2023 FINDINGS: Shallow inspiration. Cardiac enlargement. Hazy interstitial infiltrates throughout both lungs most likely representing edema although possibly interstitial pneumonia or chronic fibrosis. Small left pleural effusion with basilar atelectasis or consolidation. This could indicate pneumonia in the appropriate clinical setting. No pneumothorax. Postoperative changes in the thoracic spine. No significant change since prior study. IMPRESSION: Cardiac enlargement with probable interstitial pulmonary edema. Small left pleural effusion with basilar atelectasis or consolidation. Similar appearance to previous study. Electronically Signed   By: Burman Nieves M.D.   On: 02/10/2023 15:50    Assessment and Plan  Paula Garrett is a 57 y.o. female with multiple medical problems including hypertension, depression, anxiety, history of tobacco abuse, and multiple  myeloma, who was hospitalized 01/24/2023 to 02/06/2023 with chest pain and shortness of breath and was found to have a large soft tissue mass in the left T6-T7 paraspinal area with destruction of seventh rib and invasion of T6 and T7 vertebral bodies.  Patient underwent surgery on 8/13 with postop period complicated by pain.  Pathology positive for multiple myeloma.  Patient now readmitted 02/10/2023 with AKI    AKI in the setting of multiple myeloma -- baseline creatinine .93 -- patient came in with creatinine of 5.27--- IV fluids-- 5.3 --  patient making good urine. -- Nephrology consultation with Dr. Wynelle Link. Continue IV fluids. No indication for dialysis. -- Renal ultrasound no obstructive neuropathy  Hyperkalemia -- no EKG changes. Patient received hyperkalemia cocktail in the ER. --lokelma today  Hyponatremia -- suspected due to renal failure  Multiple myeloma -- patient seen by Dr. Smith Robert -- given significant kidney issues from multiple myeloma patient to be started on inpatient chemotherapy tomorrow. -- Follow further recommendations per Dr. Smith Robert  Recent Mass of spinal cord [facility]) --Neurosurgical procedure done on 8/13 by Dr. Katrinka Blazing.   --Patient had a posterior segmental instrumentation T4-T9, posterior lateral arthrodesis from T4-T9, thoracic laminectomy at T6 and T7.   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   Chronic back pain -- will start patient on fentanyl patch, PRN OxyContin, PRN IV Dilaudid -- patient's insurance did not approve long-acting narcotics -- bowel prep due to constipation  Asthma COPD -stable  Anxiety depression -- continue SSRI  Hypothyroidism -- continue Synthroid  Patient followed by palliative care   Family communication :none today Consults : nephrology, oncology CODE STATUS: full DVT Prophylaxis : heparin Level of care: Telemetry Medical Status is: Inpatient Remains inpatient appropriate because: acute renal failure in the setting of multiple myeloma. Patient to be started on chemotherapy    TOTAL TIME TAKING CARE OF THIS PATIENT: 35 minutes.  >50% time spent on counselling and coordination of care  Note: This dictation was prepared with Dragon dictation along with smaller phrase technology. Any transcriptional errors that result from this process are unintentional.  Enedina Finner M.D    [facility]   CC: Primary care physician; Smitty Cords, DO

## 2023-02-11 NOTE — Progress Notes (Signed)
Ok to treat with abnormal labs  Dr Smith Robert ok with dose of Cyclophosphamide  Hold Dexamethasone 40 mg IVPB and give Dexamethasone 40 mg orally x 1 prior to chemotherapy.  Hold acyclovir oral for now until renal function improves per Dr Smith Robert  T.O. Dr Dorothyann Gibbs, PharmD

## 2023-02-11 NOTE — Plan of Care (Signed)
  Problem: Education: Goal: Ability to verbalize activity precautions or restrictions will improve Outcome: Progressing Goal: Knowledge of the prescribed therapeutic regimen will improve Outcome: Progressing Goal: Understanding of discharge needs will improve Outcome: Progressing   Problem: Activity: Goal: Ability to avoid complications of mobility impairment will improve Outcome: Progressing Goal: Ability to tolerate increased activity will improve Outcome: Progressing Goal: Will remain free from falls Outcome: Progressing   Problem: Bowel/Gastric: Goal: Gastrointestinal status for postoperative course will improve Outcome: Progressing   Problem: Clinical Measurements: Goal: Ability to maintain clinical measurements within normal limits will improve Outcome: Progressing Goal: Postoperative complications will be avoided or minimized Outcome: Progressing Goal: Diagnostic test results will improve Outcome: Progressing   Problem: Skin Integrity: Goal: Will show signs of wound healing Outcome: Progressing   Problem: Health Behavior/Discharge Planning: Goal: Identification of resources available to assist in meeting health care needs will improve Outcome: Progressing   Problem: Bladder/Genitourinary: Goal: Urinary functional status for postoperative course will improve Outcome: Progressing   Problem: Education: Goal: Knowledge of General Education information will improve Description: Including pain rating scale, medication(s)/side effects and non-pharmacologic comfort measures Outcome: Progressing

## 2023-02-11 NOTE — Consult Note (Signed)
Palliative Medicine [facility] Telephone:([phone]) [phone] Fax:(336) [phone]   Name: Paula Garrett Date: 02/11/2023 MRN: [id number]  DOB: [birth year]  Patient Care Team: Smitty Cords, DO as PCP - General (Family Medicine) Lemar Livings, Merrily Pew, MD (General Surgery) Shelia Media, MD (Internal Medicine) Creig Hines, MD as Consulting Physician (Oncology)    REASON FOR CONSULTATION: Paula Garrett is a 57 y.o. female with multiple medical problems including hypertension, depression, anxiety, history of tobacco abuse, and multiple myeloma, who was hospitalized 01/24/2023 to 02/06/2023 with chest pain and shortness of breath and was found to have a large soft tissue mass in the left T6-T7 paraspinal area with destruction of seventh rib and invasion of T6 and T7 vertebral bodies.  Patient underwent surgery on 8/13 with postop period complicated by pain.  Pathology positive for multiple myeloma.  Patient now readmitted 02/10/2023 with AKI.  She was referred to palliative care to address goals and manage ongoing symptoms.  SOCIAL HISTORY:     reports that she has been smoking cigarettes. She started smoking about 42 years ago. She has a 42.9 pack-year smoking history. She has never used smokeless tobacco. She reports current alcohol use of about 10.0 - 12.0 standard drinks of alcohol per week. She reports that she does not use drugs.  Patient is unmarried and lives at home alone.  She has a son in [place] and a daughter who lives in [place].  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   ADVANCE DIRECTIVES:  Does not have  CODE STATUS: Full code  PAST MEDICAL HISTORY: Past Medical History:  Diagnosis Date   Anxiety    Arthritis    Asthma    Barrett's esophagus 2015   COVID-19    03/10/20   Depression    GERD (gastroesophageal reflux disease)    Hypertension     Hypothyroidism    Pneumonia    Smoldering myeloma    Thyroid disease     PAST SURGICAL HISTORY:  Past Surgical History:  Procedure Laterality Date   ABLATION  2006   APPLICATION OF INTRAOPERATIVE CT SCAN N/A 01/28/2023   Procedure: APPLICATION OF INTRAOPERATIVE CT SCAN;  Surgeon: Loreen Freud, MD;  Location: [facility] ORS;  Service: Neurosurgery;  Laterality: N/A;   BREAST CYST ASPIRATION Left 1998   BREAST SURGERY     cyst aspiration   BREAST SURGERY  1998   cyst aspiration   COLONOSCOPY  03/2014   COLONOSCOPY WITH PROPOFOL N/A 01/06/2020   Procedure: COLONOSCOPY WITH PROPOFOL;  Surgeon: Toledo, Boykin Nearing, MD;  Location: [facility] ENDOSCOPY;  Service: Gastroenterology;  Laterality: N/A;   ENDOMETRIAL ABLATION     ESOPHAGOGASTRODUODENOSCOPY (EGD) WITH PROPOFOL N/A 08/15/2015   Procedure: ESOPHAGOGASTRODUODENOSCOPY (EGD) WITH PROPOFOL;  Surgeon: Christena Deem, MD;  Location: [facility] ENDOSCOPY;  Service: Endoscopy;  Laterality: N/A;   ESOPHAGOGASTRODUODENOSCOPY (EGD) WITH PROPOFOL N/A 01/22/2016   Procedure: ESOPHAGOGASTRODUODENOSCOPY (EGD) WITH PROPOFOL;  Surgeon: Christena Deem, MD;  Location: [facility] ENDOSCOPY;  Service: Endoscopy;  Laterality: N/A;   ESOPHAGOGASTRODUODENOSCOPY (EGD) WITH PROPOFOL N/A 01/06/2020   Procedure: ESOPHAGOGASTRODUODENOSCOPY (EGD) WITH PROPOFOL;  Surgeon: Toledo, Boykin Nearing, MD;  Location: [facility] ENDOSCOPY;  Service: Gastroenterology;  Laterality: N/A;   GASTRIC BYPASS  2005   HERNIA REPAIR     IR BONE MARROW BIOPSY & ASPIRATION  02/05/2023   NASAL SINUS SURGERY     partial amputation Left  left 5th toe   TOTAL THYROIDECTOMY     TUBAL LIGATION      HEMATOLOGY/ONCOLOGY HISTORY:  Oncology History  Multiple myeloma (HCC)  01/25/2023 Initial Diagnosis   Multiple myeloma (HCC)   02/12/2023 -  Chemotherapy   Patient is on Treatment Plan : MYELOMA NEWLY DIAGNOSED TRANSPLANT CANDIDATE Cyclophosphamide IV + Bortezomib SQ + Dexamethasone (CyBorD) q21d x 4 cycles        ALLERGIES:  is allergic to hctz [hydrochlorothiazide].  MEDICATIONS:  Current Facility-Administered Medications  Medication Dose Route Frequency Provider Last Rate Last Admin   albuterol (PROVENTIL) (2.5 MG/3ML) 0.083% nebulizer solution 3 mL  3 mL Inhalation Q6H PRN Mikey College T, MD       allopurinol (ZYLOPRIM) tablet 100 mg  100 mg Oral Daily Creig Hines, MD       bisacodyl (DULCOLAX) EC tablet 10 mg  10 mg Oral Daily Enedina Finner, MD   10 mg at 02/11/23 0981   Chlorhexidine Gluconate Cloth 2 % PADS 6 each  6 each Topical Daily Enedina Finner, MD   6 each at 02/11/23 1004   clonazePAM (KLONOPIN) tablet 0.5 mg  0.5 mg Oral BID Mikey College T, MD   0.5 mg at 02/11/23 1914   cyclobenzaprine (FLEXERIL) tablet 5-10 mg  5-10 mg Oral TID PRN Emeline General, MD       dexamethasone (DECADRON) tablet 40 mg  40 mg Oral Daily Creig Hines, MD   40 mg at 02/11/23 7829   docusate sodium (COLACE) capsule 100 mg  100 mg Oral BID Mikey College T, MD   100 mg at 02/11/23 0821   escitalopram (LEXAPRO) tablet 10 mg  10 mg Oral Daily Mikey College T, MD   10 mg at 02/11/23 5621   fentaNYL (DURAGESIC) 12 MCG/HR 1 patch  1 patch Transdermal Q72H Enedina Finner, MD   1 patch at 02/11/23 1004   gabapentin (NEURONTIN) capsule 100 mg  100 mg Oral BID Mikey College T, MD   100 mg at 02/11/23 3086   heparin injection 5,000 Units  5,000 Units Subcutaneous Q12H Mikey College T, MD   5,000 Units at 02/11/23 5784   HYDROmorphone (DILAUDID) injection 0.5-1 mg  0.5-1 mg Intravenous Q2H PRN Mikey College T, MD   1 mg at 02/11/23 0830   HYDROmorphone (DILAUDID) tablet 1-2 mg  1-2 mg Oral Q4H PRN Skylen Danielsen, Daryl Eastern, NP       levothyroxine (SYNTHROID) tablet 250 mcg  250 mcg Oral QAC breakfast Mikey College T, MD   250 mcg at 02/11/23 0506   loratadine (CLARITIN) tablet 10 mg  10 mg Oral Daily Mikey College T, MD   10 mg at 02/11/23 6962   mometasone-formoterol (DULERA) 200-5 MCG/ACT inhaler 2 puff  2 puff Inhalation BID Mikey College T, MD   2  puff at 02/11/23 0854   mupirocin ointment (BACTROBAN) 2 % 1 Application  1 Application Nasal BID Emeline General, MD   1 Application at 02/11/23 0826   pantoprazole (PROTONIX) EC tablet 40 mg  40 mg Oral Daily Mikey College T, MD   40 mg at 02/11/23 0821   polyethylene glycol (MIRALAX / GLYCOLAX) packet 17 g  17 g Oral BID Mikey College T, MD   17 g at 02/11/23 9528   sodium bicarbonate 150 mEq in dextrose 5 % 1,150 mL infusion   Intravenous Continuous Lamont Dowdy, MD 75 mL/hr at 02/11/23 0939 New Bag at 02/11/23 0939   umeclidinium bromide (INCRUSE  ELLIPTA) 62.5 MCG/ACT 1 puff  1 puff Inhalation Daily Mikey College T, MD   1 puff at 02/11/23 0854   zolpidem (AMBIEN) tablet 5-10 mg  5-10 mg Oral QHS PRN Emeline General, MD        VITAL SIGNS: BP (!) 125/59 (BP Location: Right Arm)   Pulse 83   Temp 98.8 F (37.1 C) (Oral)   Resp 20   Ht 5\' 2"  (1.575 m)   Wt 180 lb (81.6 kg)   SpO2 93%   BMI 32.92 kg/m  Filed Weights   02/10/23 1805  Weight: 180 lb (81.6 kg)    Estimated body mass index is 32.92 kg/m as calculated from the following:   Height as of this encounter: 5\' 2"  (1.575 m).   Weight as of this encounter: 180 lb (81.6 kg).  LABS: CBC:    Component Value Date/Time   WBC 14.3 (H) 02/10/2023 0906   HGB 8.4 (L) 02/10/2023 0906   HGB 13.0 02/14/2012 1528   HCT 25.2 (L) 02/10/2023 0906   HCT 38.5 07/02/2011 1529   PLT 439 (H) 02/10/2023 0906   PLT 311 07/02/2011 1529   MCV 94.4 02/10/2023 0906   MCV 90 07/02/2011 1529   NEUTROABS 10.4 (H) 02/10/2023 0906   LYMPHSABS 2.7 02/10/2023 0906   MONOABS 1.0 02/10/2023 0906   EOSABS 0.1 02/10/2023 0906   BASOSABS 0.0 02/10/2023 0906   Comprehensive Metabolic Panel:    Component Value Date/Time   NA 126 (L) 02/11/2023 0457   K 5.4 (H) 02/11/2023 0457   CL 98 02/11/2023 0457   CO2 18 (L) 02/11/2023 0457   BUN 59 (H) 02/11/2023 0457   CREATININE 5.30 (H) 02/11/2023 0457   GLUCOSE 92 02/11/2023 0457   CALCIUM 7.7 (L) 02/11/2023  0457   AST 18 02/10/2023 0906   ALT 16 02/10/2023 0906   ALKPHOS 60 02/10/2023 0906   BILITOT 0.3 02/10/2023 0906   PROT 8.2 (H) 02/10/2023 0906   ALBUMIN 2.1 (L) 02/10/2023 0906    RADIOGRAPHIC STUDIES: US RENAL  Result Date: 02/10/2023 CLINICAL DATA:  AKI EXAM: RENAL / URINARY TRACT ULTRASOUND COMPLETE COMPARISON:  None Available. FINDINGS: Right Kidney: Renal measurements: 12.5 cm x 5.6 cm x 4.6 cm = volume: 165 mL. Parenchymal echogenicity is normal. There is no hydronephrosis. Left Kidney: Renal measurements: 12.6 cm x 6.4 cm x 5.0 cm = volume: 213 mL. Parenchymal echogenicity is normal. There is no hydronephrosis. Bladder: Decompressed by a Foley catheter. Other: None. IMPRESSION: Unremarkable renal ultrasound. Electronically Signed   By: Lesia Hausen M.D.   On: 02/10/2023 17:03   DG Chest 1 View  Result Date: 02/10/2023 CLINICAL DATA:  Acute kidney injury. Sodium 120. Elevated creatinine. EXAM: CHEST  1 VIEW COMPARISON:  02/01/2023 FINDINGS: Shallow inspiration. Cardiac enlargement. Hazy interstitial infiltrates throughout both lungs most likely representing edema although possibly interstitial pneumonia or chronic fibrosis. Small left pleural effusion with basilar atelectasis or consolidation. This could indicate pneumonia in the appropriate clinical setting. No pneumothorax. Postoperative changes in the thoracic spine. No significant change since prior study. IMPRESSION: Cardiac enlargement with probable interstitial pulmonary edema. Small left pleural effusion with basilar atelectasis or consolidation. Similar appearance to previous study. Electronically Signed   By: Burman Nieves M.D.   On: 02/10/2023 15:50   IR BONE MARROW BIOPSY & ASPIRATION  Result Date: 02/05/2023 INDICATION: Hematologic abnormality EXAM: Bone marrow aspiration and core biopsy using fluoroscopic guidance MEDICATIONS: None. ANESTHESIA/SEDATION: Moderate (conscious) sedation was employed during this procedure.  A  total of Versed 1.5 mg and Fentanyl 75 mcg was administered intravenously. Moderate Sedation Time: 10 minutes. The patient's level of consciousness and vital signs were monitored continuously by radiology nursing throughout the procedure under my direct supervision. FLUOROSCOPY TIME:  Fluoroscopy Time: 0.7 minutes (13 mGy) COMPLICATIONS: None immediate. PROCEDURE: Informed written consent was obtained from the patient after a thorough discussion of the procedural risks, benefits and alternatives. All questions were addressed. Maximal Sterile Barrier Technique was utilized including caps, mask, sterile gowns, sterile gloves, sterile drape, hand hygiene and skin antiseptic. A timeout was performed prior to the initiation of the procedure. The patient was placed prone on the exam table. Limited fluoroscopy of the pelvis was performed for planning purposes. Skin entry site was marked, and the overlying skin was prepped and draped in the standard sterile fashion. Local analgesia was obtained with 1% lidocaine. Using fluoroscopic guidance, an 11 gauge needle was advanced just deep to the cortex of the right posterior ilium. Subsequently, bone marrow aspiration and core biopsy were performed. Specimens were submitted to lab/pathology for handling. Hemostasis was achieved with manual pressure, and a clean dressing was placed. The patient tolerated the procedure well without immediate complication. IMPRESSION: Successful bone marrow aspiration and core biopsy of the right posterior ilium. Electronically Signed   By: Olive Bass M.D.   On: 02/05/2023 15:35   CT HEAD WO CONTRAST ( )  Result Date: 02/03/2023 CLINICAL DATA:  Mental status change, unknown cause. Acute hypoxic respiratory failure EXAM: CT HEAD WITHOUT CONTRAST TECHNIQUE: Contiguous axial images were obtained from the base of the skull through the vertex without intravenous contrast. RADIATION DOSE REDUCTION: This exam was performed according to the  departmental dose-optimization program which includes automated exposure control, adjustment of the mA and/or kV according to patient size and/or use of iterative reconstruction technique. COMPARISON:  Bone survey 10/09/2022. FINDINGS: Brain: No acute intracranial abnormality. Specifically, no hemorrhage, hydrocephalus, mass lesion, acute infarction, or significant intracranial injury. Vascular: No hyperdense vessel or unexpected calcification. Skull: Focal lucent lesions throughout the skull. 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 These lytic lesions were not in the skull on the skull x-ray from 10/09/2022. Appearance is concerning for possible multiple myeloma. No fracture. Sinuses/Orbits: No acute findings Other: None IMPRESSION: No acute intracranial abnormality. Numerous scattered focal lucent lesions throughout the skull. These are new since prior bone survey from 10/09/2022 and concerning for multiple myeloma. Electronically Signed   By: Charlett Nose M.D.   On: 02/03/2023 16:30   DG Chest Port 1 View  Result Date: 02/01/2023 CLINICAL DATA:  Hypoxia EXAM: PORTABLE CHEST 1 VIEW COMPARISON:  01/30/2023 FINDINGS: Bilateral diffuse interstitial thickening and patchy alveolar airspace opacities at the right lung base and left mid lower lung. No pleural effusion or pneumothorax. Stable cardiomediastinal silhouette. No acute osseous abnormality. Posterior spinal fusion of the upper and midthoracic spine. IMPRESSION: 1. Bilateral diffuse interstitial thickening and patchy alveolar airspace opacities at the right lung base and left mid lower lung concerning for pulmonary edema. Electronically Signed   By: Elige Ko M.D.   On: 02/01/2023 10:13   ECHOCARDIOGRAM COMPLETE  Result Date: 01/31/2023    ECHOCARDIOGRAM REPORT   Patient Name:   KAMARIANA CABRAL Date of Exam: 01/31/2023 Medical Rec #:  [id number]      Height:       62.0 in Accession #:    [id number]     Weight:       170.0  lb Date of Birth:  [birth year]      BSA:          1.784 m Patient Age:    57 years       BP:           106/57 mmHg Patient Gender: F              HR:           110 bpm. Exam Location:  [facility] Procedure: 2D Echo, Cardiac Doppler and Color Doppler Indications:     Dyspnea  History:         Patient has no prior history of Echocardiogram examinations.                  Signs/Symptoms:Dyspnea, Chest Pain, Fatigue and Shortness of                  Breath; Risk Factors:Hypertension, Sleep Apnea and Current                  Smoker.  Sonographer:     Mikki Harbor Referring Phys:  [id number] Alford Highland Diagnosing Phys: Julien Nordmann MD  Sonographer Comments: Image acquisition challenging due to respiratory motion. IMPRESSIONS  1. Left ventricular ejection fraction, by estimation, is 60 to 65%. The left ventricle has normal function. The left ventricle has no regional wall motion abnormalities. There is mild left ventricular hypertrophy. Left ventricular diastolic parameters are consistent with Grade I diastolic dysfunction (impaired relaxation).  2. Right ventricular systolic function is normal. The right ventricular size is normal. There is normal pulmonary artery systolic pressure. The estimated right ventricular systolic pressure is 30.9 mmHg.  3. Left atrial size was mildly dilated.  4. The mitral valve is normal in structure. No evidence of mitral valve regurgitation. No evidence of mitral stenosis.  5. The aortic valve is tricuspid. Aortic valve regurgitation is not visualized. No aortic stenosis is present.  6. The inferior vena cava is normal in size with greater than 50% respiratory variability, suggesting right atrial pressure of 3 mmHg. FINDINGS  Left Ventricle: Left ventricular ejection fraction, by estimation, is 60 to 65%. The left ventricle has normal function. The left ventricle has no regional wall motion abnormalities. The left ventricular internal cavity size was normal in size. There is  mild left  ventricular hypertrophy. Left ventricular diastolic parameters are consistent with Grade I diastolic dysfunction (impaired relaxation). Right Ventricle: The right ventricular size is normal. No increase in right ventricular wall thickness. Right ventricular systolic function is normal. There is normal pulmonary artery systolic pressure. The tricuspid regurgitant velocity is 2.64 m/s, and  with an assumed right atrial pressure of 3 mmHg, the estimated right ventricular systolic pressure is 30.9 mmHg. Left Atrium: Left atrial size was mildly dilated. Right Atrium: Right atrial size was normal in size. Pericardium: There is no evidence of pericardial effusion. Mitral Valve: The mitral valve is normal in structure. No evidence of mitral valve regurgitation. No evidence of mitral valve stenosis. MV peak gradient, 8.0 mmHg. The mean mitral valve gradient is 4.0 mmHg. Tricuspid Valve: The tricuspid valve is normal in structure. Tricuspid valve regurgitation is mild . No evidence of tricuspid stenosis. Aortic Valve: The aortic valve is tricuspid. Aortic valve regurgitation is not visualized. No aortic stenosis is present. Aortic valve mean gradient measures 9.0 mmHg. Aortic valve peak gradient measures 18.4 mmHg. Aortic valve area, by VTI measures 3.04  cm. Pulmonic Valve: The pulmonic valve was normal in structure. Pulmonic  valve regurgitation is not visualized. No evidence of pulmonic stenosis. Aorta: The aortic root is normal in size and structure. Venous: The inferior vena cava is normal in size with greater than 50% respiratory variability, suggesting right atrial pressure of 3 mmHg. IAS/Shunts: No atrial level shunt detected by color flow Doppler.  LEFT VENTRICLE PLAX 2D LVIDd:         5.80 cm   Diastology LVIDs:         3.60 cm   LV e' medial:    9.36 cm/s LV PW:         0.90 cm   LV E/e' medial:  9.7 LV IVS:        1.00 cm   LV e' lateral:   10.00 cm/s LVOT diam:     2.20 cm   LV E/e' lateral: 9.1 LV SV:         99  LV SV Index:   55 LVOT Area:     3.80 cm  RIGHT VENTRICLE RV Basal diam:  3.45 cm RV Mid diam:    3.00 cm RV S prime:     27.40 cm/s TAPSE (M-mode): 3.3 cm LEFT ATRIUM             Index        RIGHT ATRIUM           Index LA diam:        4.80 cm 2.69 cm/m   RA Area:     18.90 cm LA Vol (A2C):   59.6 ml 33.41 ml/m  RA Volume:   54.10 ml  30.32 ml/m LA Vol (A4C):   66.9 ml 37.50 ml/m LA Biplane Vol: 65.0 ml 36.43 ml/m  AORTIC VALVE AV Area (Vmax):    3.09 cm AV Area (Vmean):   3.04 cm AV Area (VTI):     3.04 cm AV Vmax:           214.50 cm/s AV Vmean:          136.750 cm/s AV VTI:            0.326 m AV Peak Grad:      18.4 mmHg AV Mean Grad:      9.0 mmHg LVOT Vmax:         174.50 cm/s LVOT Vmean:        109.500 cm/s LVOT VTI:          0.260 m LVOT/AV VTI ratio: 0.80  AORTA Ao Root diam: 3.20 cm MITRAL VALVE                TRICUSPID VALVE MV Area (PHT): 3.72 cm     TR Peak grad:   27.9 mmHg MV Area VTI:   2.90 cm     TR Vmax:        264.00 cm/s MV Peak grad:  8.0 mmHg MV Mean grad:  4.0 mmHg     SHUNTS MV Vmax:       1.41 m/s     Systemic VTI:  0.26 m MV Vmean:      91.6 cm/s    Systemic Diam: 2.20 cm MV Decel Time: 204 msec MV E velocity: 90.70 cm/s MV A velocity: 117.00 cm/s MV E/A ratio:  0.78 Julien Nordmann MD Electronically signed by Julien Nordmann MD Signature Date/Time: 01/31/2023/5:43:31 PM    Final    DG Thoracic Spine 2 View  Result Date: 01/31/2023 CLINICAL DATA:  Back pain. 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 EXAM: THORACIC SPINE 2 VIEWS COMPARISON:  MRI of the cervicothoracic spine 01/24/2023. Intraoperative imaging of the thoracic spine 01/28/2023. Chest CTA 01/24/2023. 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 Counting superiorly from the T12 level, the patient has undergone midthoracic spinal decompression with posterior pedicle screw and rod fusion from T4 through T9. The hardware appears intact and  well positioned. Chronic superior endplate compression deformity at T11 is unchanged. No acute osseous complications are identified. There is mild atelectasis at both lung bases. IMPRESSION: No demonstrated acute findings status post recent midthoracic spinal decompression and fusion. Electronically Signed   By: Carey Bullocks M.D.   On: 01/31/2023 16:27   DG Chest Port 1 View  Result Date: 01/30/2023 CLINICAL DATA:  Cough, shortness of breath EXAM: PORTABLE CHEST 1 VIEW COMPARISON:  01/24/2023 FINDINGS: Cardiomegaly. Diffuse bilateral interstitial pulmonary opacity and trace pleural effusions. Interval thoracic fusion. IMPRESSION: Cardiomegaly with diffuse bilateral interstitial pulmonary opacity and trace pleural effusions, most consistent with edema. Electronically Signed   By: Jearld Lesch M.D.   On: 01/30/2023 14:07   DG Thoracic Spine 2 View  Result Date: 01/28/2023 CLINICAL DATA:  Elective surgery. T4 through T9 fusion of biopsy for lesion with intra 3D imaging. EXAM: THORACIC SPINE 2 VIEWS COMPARISON:  Preoperative imaging. FINDINGS: Intraoperative 3D imaging of the thoracic spine obtained. Posterior rod with pedicle screws at multiple levels. Known paravertebral mass better assessed on preoperative imaging. No fluoroscopic spot views provided. Fluoroscopy time 1 minutes 30 seconds. Dose is 97.045 mGy IMPRESSION: Intraoperative 3D imaging of the thoracic spine. Electronically Signed   By: Narda Rutherford M.D.   On: 01/28/2023 16:28   DG C-Arm 1-60 Min-No Report  Result Date: 01/28/2023 Fluoroscopy was utilized by the requesting physician.  No radiographic interpretation.   DG C-Arm 1-60 Min-No Report  Result Date: 01/28/2023 Fluoroscopy was utilized by the requesting physician.  No radiographic interpretation.   DG C-Arm 1-60 Min-No Report  Result Date: 01/28/2023 Fluoroscopy was utilized by the requesting physician.  No radiographic interpretation.   DG C-Arm 1-60 Min-No  Report  Result Date: 01/28/2023 Fluoroscopy was utilized by the requesting physician.  No radiographic interpretation.   MR THORACIC SPINE W WO CONTRAST  Result Date: 01/24/2023 CLINICAL DATA:  Soft tissue mass with bony destruction involving the left seventh rib and T6 and T7 vertebral bodies. EXAM: MRI CERVICAL AND THORACIC SPINE WITHOUT AND WITH CONTRAST TECHNIQUE: Multiplanar and multiecho pulse sequences of the cervical spine, to include the craniocervical junction and cervicothoracic junction, and the thoracic spine, were obtained without and with intravenous contrast. CONTRAST:  7mL GADAVIST GADOBUTROL 1 MMOL/ML IV SOLN COMPARISON:  Same day CTA chest, cervical and thoracic spine MRI 07/20/2020 FINDINGS: MRI CERVICAL SPINE FINDINGS Alignment: There is trace retrolisthesis of C3 on C4 and trace anterolisthesis of C5 on C6. Vertebrae: Vertebral body heights are preserved. Background marrow signal is normal. A T1 hypointense/T2 hyperintense lesion in the clivus is unchanged since 2022, favored benign. There is no suspicious marrow signal abnormality or marrow edema in the cervical spine. There is no abnormal marrow enhancement in the cervical spine. There is a 6 mm enhancing lesion in the right occipital calvarium which was not seen on the prior cervical spine MRI from 2022; however, this area may not has been included within the field of view. Cord: Normal in signal and morphology. Posterior Fossa, vertebral arteries, paraspinal tissues: The imaged posterior fossa is unremarkable. The vertebral artery  flow voids are normal. The paraspinal soft tissues are unremarkable. Disc levels: There is overall mild degenerative change in the cervical spine without significant spinal canal or neural foraminal stenosis. MRI THORACIC SPINE FINDINGS Alignment:  Normal. Vertebrae: Background marrow signal is normal. There is infiltrative T1 hypointensity occupying most of the T6 and T7 vertebral bodies extending into the  left pedicles and facets. There is a bulky soft tissue mass in the adjacent left paraspinal/subpleural soft tissues with destruction of the seventh rib. The bulk of the soft tissue component measures up to proximally 7.2 cm x 3.4 cm x 2.7 cm. 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 There is mild loss of vertebral body height at both levels consistent with associated pathologic fractures. There is compression deformity of the T11 vertebral body with up to approximately 30% loss of vertebral body height anteriorly and minimal bony retropulsion. There is faint edema along the superior endplate consistent with acute to subacute chronicity. This fracture may be pathologic. There are probable additional lesions involving multiple additional ribs (for example 25-21, 25-24). The above findings are new since the MRI from 2022. Cord:  There is no cord signal abnormality or abnormal enhancement. Paraspinal and other soft tissues: Unremarkable, aside from the bulky paraspinal tumor described above. Disc levels: There is a small disc protrusion at T10-T11 without significant spinal canal or neural foraminal stenosis. Otherwise, there is overall minimal background degenerative change without significant spinal canal or neural foraminal stenosis. IMPRESSION: 1. Large soft tissue mass in the left paraspinal soft tissues at T6-T7 with destruction of the seventh rib and invasion of the T6 and T7 vertebral bodies with involvement of the left posterior elements as well as epidural tumor in the spinal canal resulting in moderate spinal canal stenosis without frank cord compression or cord edema. 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 2. Acute to  subacute compression fracture of the T11 vertebral body with up to approximately 30% loss of vertebral body height and minimal bony retropulsion, possibly pathologic. 3. Small enhancing lesion in the right occipital calvarium, not definitely present on the prior cervical spine from 2022 (though possibly not included within the field of view on that study), suspicious for an additional metastatic or myeloma lesion. 4. Additional probable lesions involving multiple additional ribs. 5. No acute or suspicious finding in the cervical spine. Electronically Signed   By: Lesia Hausen M.D.   On: 01/24/2023 14:33   MR Cervical Spine W and Wo Contrast  Result Date: 01/24/2023 CLINICAL DATA:  Soft tissue mass with bony destruction involving the left seventh rib and T6 and T7 vertebral bodies. EXAM: MRI CERVICAL AND THORACIC SPINE WITHOUT AND WITH CONTRAST TECHNIQUE: Multiplanar and multiecho pulse sequences of the cervical spine, to include the craniocervical junction and cervicothoracic junction, and the thoracic spine, were obtained without and with intravenous contrast. CONTRAST:  7mL GADAVIST GADOBUTROL 1 MMOL/ML IV SOLN COMPARISON:  Same day CTA chest, cervical and thoracic spine MRI 07/20/2020 FINDINGS: MRI CERVICAL SPINE FINDINGS Alignment: There is trace retrolisthesis of C3 on C4 and trace anterolisthesis of C5 on C6. Vertebrae: Vertebral body heights are preserved. Background marrow signal is normal. A T1 hypointense/T2 hyperintense lesion in the clivus is unchanged since 2022, favored benign. There is  no suspicious marrow signal abnormality or marrow edema in the cervical spine. There is no abnormal marrow enhancement in the cervical spine. There is a 6 mm enhancing lesion in the right occipital calvarium which was not seen on the prior cervical spine MRI from 2022; however, this area may not has been included within the field of view. Cord: Normal in signal and morphology. Posterior Fossa, vertebral arteries,  paraspinal tissues: The imaged posterior fossa is unremarkable. The vertebral artery flow voids are normal. The paraspinal soft tissues are unremarkable. Disc levels: There is overall mild degenerative change in the cervical spine without significant spinal canal or neural foraminal stenosis. MRI THORACIC SPINE FINDINGS Alignment:  Normal. Vertebrae: Background marrow signal is normal. There is infiltrative T1 hypointensity occupying most of the T6 and T7 vertebral bodies extending into the left pedicles and facets. There is a bulky soft tissue mass in the adjacent left paraspinal/subpleural soft tissues with destruction of the seventh rib. The bulk of the soft tissue component measures up to proximally 7.2 cm x 3.4 cm x 2.7 cm. 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 There is mild loss of vertebral body height at both levels consistent with associated pathologic fractures. There is compression deformity of the T11 vertebral body with up to approximately 30% loss of vertebral body height anteriorly and minimal bony retropulsion. There is faint edema along the superior endplate consistent with acute to subacute chronicity. This fracture may be pathologic. There are probable additional lesions involving multiple additional ribs (for example 25-21, 25-24). The above findings are new since the MRI from 2022. Cord:  There is no cord signal abnormality or abnormal enhancement. Paraspinal and other soft tissues: Unremarkable, aside from the bulky paraspinal tumor described above. Disc levels: There is a small disc protrusion at T10-T11 without significant spinal canal or neural foraminal stenosis. Otherwise, there is overall minimal background degenerative change without significant  spinal canal or neural foraminal stenosis. IMPRESSION: 1. Large soft tissue mass in the left paraspinal soft tissues at T6-T7 with destruction of the seventh rib and invasion of the T6 and T7 vertebral bodies with involvement of the left posterior elements as well as epidural tumor in the spinal canal resulting in moderate spinal canal stenosis without frank cord compression or cord edema. 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 2. Acute to subacute compression fracture of the T11 vertebral body with up to approximately 30% loss of vertebral body height and minimal bony retropulsion, possibly pathologic. 3. Small enhancing lesion in the right occipital calvarium, not definitely present on the prior cervical spine from 2022 (though possibly not included within the field of view on that study), suspicious for an additional metastatic or myeloma lesion. 4. Additional probable lesions involving multiple additional ribs. 5. No acute or suspicious finding in the cervical spine. Electronically Signed   By: Lesia Hausen M.D.   On: 01/24/2023 14:33   CT Angio Chest PE W and/or Wo Contrast  Result Date: 01/24/2023 CLINICAL DATA:  Chest pain and shortness of breath for a month. EXAM: CT ANGIOGRAPHY CHEST WITH CONTRAST TECHNIQUE: Multidetector CT imaging of the chest was performed using the standard protocol during bolus administration of intravenous contrast. Multiplanar CT image reconstructions and MIPs were obtained to evaluate  the vascular anatomy. RADIATION DOSE REDUCTION: This exam was performed according to the departmental dose-optimization program which includes automated exposure control, adjustment of the mA and/or kV according to patient size and/or use of iterative reconstruction technique. CONTRAST:  75mL OMNIPAQUE IOHEXOL 350 MG/ML SOLN COMPARISON:  X-ray 01/24/2023 and older.  Previous CT January 2017 FINDINGS: Cardiovascular: The thoracic aorta has a  normal course and caliber with minimal calcified plaque. There is a bovine type aortic arch, normal variant. Coronary artery calcifications are seen. Please correlate for other coronary risk factors. Heart is nonenlarged. There is slight wall thickening of the left ventricle. Prominent fat along the intra-atrial septum. Pulmonary arteries show some slight enlargement centrally. Please correlate for any evidence of pulmonary artery hypertension. There is heterogeneous enhancement of the pulmonary arterial tree limiting evaluation for emboli. No obvious large and central embolus although several areas are nondiagnostic. Mediastinum/Nodes: Surgical changes identified with a moderate hiatal hernia. Small thyroid gland. No specific abnormal lymph node enlargement identified in the axillary region, hilum or mediastinum. There are some small nodes identified in the posterior mediastinum, posterior to the descending thoracic aorta such as series 4, image 86 but not pathologic by size criteria. Lungs/Pleura: There is some linear opacity lung bases likely scar or atelectasis. No consolidation. Breathing motion identified. No pleural effusion or pneumothorax. 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 Benign hepatic cysts as well. Musculoskeletal: There is large destructive mass involving the posterior aspect of the left seventh rib with extension to involve the central canal of the spine in the associated vertebral bodies of T6 and T7. Cord compression is possible. This mass is estimated in diameter on series 4, image 65 at 8.5 by 4.3 cm. Slight extension along the extrapleural space in the posterior mediastinum as well. There is question of some subtle other lucent lesions identified along the spine but indeterminate. Critical Value/emergent results were called by telephone at the time of interpretation on 01/24/2023 at 8:41 am to provider [facility] , who verbally  acknowledged these results. Review of the MIP images confirms the above findings. IMPRESSION: Aggressive soft tissue mass with bone destruction involving the left seventh rib as well as the T6 and T7 vertebral bodies and central canal. A neoplastic process is possible. There also is a possibility of significant cord compression with canal encroachment along the spine. Recommend further evaluation with spine MRI with and without contrast and a whole-body bone scan. There appears to be some other subtle lucent bone lesions along the spine and sternum. Please correlate for any history of known malignancy including such processes as myeloma or other. Poor opacification of the pulmonary arterial tree limiting evaluation. Several areas are non diagnostic for pulmonary emboli. No obvious large and central embolus. Aortic Atherosclerosis (ICD10-I70.0). Electronically Signed   By: Karen Kays M.D.   On: 01/24/2023 11:47   DG Chest 2 View  Result Date: 01/24/2023 CLINICAL DATA:  Chest pain EXAM: CHEST - 2 VIEW COMPARISON:  Chest x-ray dated January 07, 2023 FINDINGS: The heart size and mediastinal contours are within normal limits. Small hiatal hernia. Both lungs are clear. Pleural-based nodular opacity of the left posterior hemithorax. The visualized skeletal structures are unremarkable. IMPRESSION: Pleural-based nodular opacity of the left posterior hemithorax. Recommend further evaluation with contrast enhanced chest CT. Electronically Signed   By: Allegra Lai M.D.   On: 01/24/2023 09:38    PERFORMANCE STATUS (ECOG) : 1 - Symptomatic but completely ambulatory  Review of  Systems Unless otherwise noted, a complete review of systems is negative.  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  Now readmitted with AKI secondary to multiple myeloma.  Patient is pending initiation of chemotherapy.  Patient currently receiving chemo education.  Symptomatically, she endorses persistent back pain.  She was appropriately rotated from OxyContin to fentanyl, which was applied this morning.  Discussed that it would take 18 to 24 hours to reach cmax.  She will likely require increased utilization of as needed medications today.  Given renal dysfunction, will rotate from oxycodone to oral hydromorphone.  Would prefer to maximize oral regimen but will leave IV hydromorphone in place to use as needed for breakthrough pain.  Patient denies constipation, although this was problematic during her recent hospitalization.  She had show endorses loose stools today.  Will reduce bowel regimen.  PLAN: -Continue current scope of treatment -Agree with transdermal fentanyl -Rotate from oxycodone to oral hydromorphone -Reduce bowel regimen but monitor for constipation -Patient pending chemotherapy tomorrow  Case and plan discussed with Dr. Smith Robert  Time Total: 30 minutes  Visit consisted of counseling and education dealing with the complex and emotionally intense issues of symptom management and palliative care in the setting of serious and potentially life-threatening illness.Greater than 50%  of this time was spent counseling and coordinating care related to the above assessment and plan.  Signed by: Laurette Schimke, PhD, NP-C

## 2023-02-12 ENCOUNTER — Other Ambulatory Visit: Payer: Self-pay

## 2023-02-12 ENCOUNTER — Inpatient Hospital Stay: Payer: Managed Care, Other (non HMO)

## 2023-02-12 ENCOUNTER — Other Ambulatory Visit: Payer: Self-pay | Admitting: Oncology

## 2023-02-12 DIAGNOSIS — N179 Acute kidney failure, unspecified: Secondary | ICD-10-CM | POA: Diagnosis not present

## 2023-02-12 DIAGNOSIS — Z515 Encounter for palliative care: Secondary | ICD-10-CM | POA: Diagnosis not present

## 2023-02-12 DIAGNOSIS — C9000 Multiple myeloma not having achieved remission: Secondary | ICD-10-CM

## 2023-02-12 LAB — BASIC METABOLIC PANEL
Anion gap: 11 (ref 5–15)
BUN: 62 mg/dL — ABNORMAL HIGH (ref 6–20)
CO2: 17 mmol/L — ABNORMAL LOW (ref 22–32)
Calcium: 7.9 mg/dL — ABNORMAL LOW (ref 8.9–10.3)
Chloride: 98 mmol/L (ref 98–111)
Creatinine, Ser: 5.09 mg/dL — ABNORMAL HIGH (ref 0.44–1.00)
GFR, Estimated: 9 mL/min — ABNORMAL LOW (ref >60)
Glucose, Bld: 201 mg/dL — ABNORMAL HIGH (ref 70–99)
Potassium: 5.2 mmol/L — ABNORMAL HIGH (ref 3.5–5.1)
Sodium: 126 mmol/L — ABNORMAL LOW (ref 135–145)

## 2023-02-12 LAB — GLUCOSE, CAPILLARY: Glucose-Capillary: 100 mg/dL — ABNORMAL HIGH (ref 70–99)

## 2023-02-12 MED ORDER — OXYCODONE HCL ER 20 MG PO T12A: 20.0000 mg | EXTENDED_RELEASE_TABLET | Freq: Two times a day (BID) | ORAL | Status: DC

## 2023-02-12 MED ORDER — BUTALBITAL-APAP-CAFFEINE 50-325-40 MG PO TABS
2.0000 | ORAL_TABLET | Freq: Once | ORAL | Status: AC
  Administered 2023-02-12: 2 via ORAL
  Filled 2023-02-12: qty 2

## 2023-02-12 MED ORDER — FLUDEOXYGLUCOSE F - 18 (FDG) INJECTION
9.3000 | Freq: Once | INTRAVENOUS | Status: AC | PRN
  Administered 2023-02-12: 10.05 via INTRAVENOUS

## 2023-02-12 NOTE — Plan of Care (Signed)
  Problem: Education: Goal: Ability to verbalize activity precautions or restrictions will improve Outcome: Progressing Goal: Knowledge of the prescribed therapeutic regimen will improve Outcome: Progressing Goal: Understanding of discharge needs will improve Outcome: Progressing   Problem: Activity: Goal: Ability to avoid complications of mobility impairment will improve Outcome: Progressing Goal: Ability to tolerate increased activity will improve Outcome: Progressing Goal: Will remain free from falls Outcome: Progressing   Problem: Bowel/Gastric: Goal: Gastrointestinal status for postoperative course will improve Outcome: Progressing   Problem: Pain Management: Goal: Pain level will decrease Outcome: Progressing   Problem: Skin Integrity: Goal: Will show signs of wound healing Outcome: Progressing   Problem: Coping: Goal: Level of anxiety will decrease Outcome: Progressing

## 2023-02-12 NOTE — Progress Notes (Signed)
Palliative Medicine [facility] at [facility] Telephone:([phone]) [phone] Fax:(336) [phone]   Name: Paula Garrett Date: 02/12/2023 MRN: [id number]  DOB: 13-Dec-1965  Patient Care Team: Smitty Cords, DO as PCP - General (Family Medicine) Lemar Livings, Merrily Pew, MD (General Surgery) Shelia Media, MD (Internal Medicine) Creig Hines, MD as Consulting Physician (Oncology)    REASON FOR CONSULTATION: Paula Garrett is a 57 y.o. female with multiple medical problems including hypertension, depression, anxiety, history of tobacco abuse, and multiple myeloma, who was hospitalized 01/24/2023 to 02/06/2023 with chest pain and shortness of breath and was found to have a large soft tissue mass in the left T6-T7 paraspinal area with destruction of seventh rib and invasion of T6 and T7 vertebral bodies.  Patient underwent surgery on 8/13 with postop period complicated by pain.  Pathology positive for multiple myeloma.  Patient now readmitted 02/10/2023 with AKI.  She was referred to palliative care to address goals and manage ongoing symptoms.   CODE STATUS: Full code  PAST MEDICAL HISTORY: Past Medical History:  Diagnosis Date   Anxiety    Arthritis    Asthma    Barrett's esophagus 2015   COVID-19    03/10/20   Depression    GERD (gastroesophageal reflux disease)    Hypertension    Hypothyroidism    Pneumonia    Smoldering myeloma    Thyroid disease     PAST SURGICAL HISTORY:  Past Surgical History:  Procedure Laterality Date   ABLATION  2006   APPLICATION OF INTRAOPERATIVE CT SCAN N/A 01/28/2023   Procedure: APPLICATION OF INTRAOPERATIVE CT SCAN;  Surgeon: Loreen Freud, MD;  Location: [facility] ORS;  Service: Neurosurgery;  Laterality: N/A;   BREAST CYST ASPIRATION Left 1998   BREAST SURGERY     cyst aspiration   BREAST SURGERY  1998   cyst aspiration   COLONOSCOPY  03/2014   COLONOSCOPY WITH PROPOFOL N/A 01/06/2020   Procedure: COLONOSCOPY  WITH PROPOFOL;  Surgeon: Toledo, Boykin Nearing, MD;  Location: [facility] ENDOSCOPY;  Service: Gastroenterology;  Laterality: N/A;   ENDOMETRIAL ABLATION     ESOPHAGOGASTRODUODENOSCOPY (EGD) WITH PROPOFOL N/A 08/15/2015   Procedure: ESOPHAGOGASTRODUODENOSCOPY (EGD) WITH PROPOFOL;  Surgeon: Christena Deem, MD;  Location: [facility] ENDOSCOPY;  Service: Endoscopy;  Laterality: N/A;   ESOPHAGOGASTRODUODENOSCOPY (EGD) WITH PROPOFOL N/A 01/22/2016   Procedure: ESOPHAGOGASTRODUODENOSCOPY (EGD) WITH PROPOFOL;  Surgeon: Christena Deem, MD;  Location: [facility] ENDOSCOPY;  Service: Endoscopy;  Laterality: N/A;   ESOPHAGOGASTRODUODENOSCOPY (EGD) WITH PROPOFOL N/A 01/06/2020   Procedure: ESOPHAGOGASTRODUODENOSCOPY (EGD) WITH PROPOFOL;  Surgeon: Toledo, Boykin Nearing, MD;  Location: [facility] ENDOSCOPY;  Service: Gastroenterology;  Laterality: N/A;   GASTRIC BYPASS  2005   HERNIA REPAIR     IR BONE MARROW BIOPSY & ASPIRATION  02/05/2023   NASAL SINUS SURGERY     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   TOTAL THYROIDECTOMY     TUBAL LIGATION      HEMATOLOGY/ONCOLOGY HISTORY:  Oncology History  Multiple myeloma (HCC)  01/25/2023 Initial Diagnosis   Multiple myeloma (HCC)   02/11/2023 -  Chemotherapy   Patient is on Treatment Plan : MYELOMA NEWLY DIAGNOSED TRANSPLANT CANDIDATE Cyclophosphamide IV + Bortezomib SQ + Dexamethasone (CyBorD) q21d x 4 cycles       ALLERGIES:  is allergic to hctz [hydrochlorothiazide].  MEDICATIONS:  Current Facility-Administered Medications  Medication Dose Route Frequency Provider Last Rate Last Admin   0.9 %  sodium chloride infusion  Intravenous Once PRN Creig Hines, MD       0.9 %  sodium chloride infusion   Intravenous Continuous Enedina Finner, MD 150 mL/hr at 02/12/23 0434 New Bag at 02/12/23 0434   albuterol (PROVENTIL) (2.5 MG/3ML) 0.083% nebulizer solution 2.5 mg  2.5 mg Nebulization Once PRN Creig Hines, MD       albuterol (PROVENTIL) (2.5 MG/3ML) 0.083% nebulizer solution 3 mL  3  mL Inhalation Q6H PRN Mikey College T, MD       allopurinol (ZYLOPRIM) tablet 100 mg  100 mg Oral Daily Creig Hines, MD   100 mg at 02/12/23 1306   alteplase (CATHFLO ACTIVASE) injection 2 mg  2 mg Intracatheter Once PRN Creig Hines, MD       bisacodyl (DULCOLAX) EC tablet 10 mg  10 mg Oral Daily Enedina Finner, MD   10 mg at 02/12/23 1306   Chlorhexidine Gluconate Cloth 2 % PADS 6 each  6 each Topical Daily Enedina Finner, MD   6 each at 02/12/23 1306   clonazePAM (KLONOPIN) tablet 0.5 mg  0.5 mg Oral BID Mikey College T, MD   0.5 mg at 02/12/23 1311   cyclobenzaprine (FLEXERIL) tablet 5-10 mg  5-10 mg Oral TID PRN Emeline General, MD       dexamethasone (DECADRON) tablet 40 mg  40 mg Oral Daily Creig Hines, MD   40 mg at 02/12/23 0857   diphenhydrAMINE (BENADRYL) injection 50 mg  50 mg Intravenous Once PRN Creig Hines, MD       docusate sodium (COLACE) capsule 100 mg  100 mg Oral BID Mikey College T, MD   100 mg at 02/12/23 1306   EPINEPHrine (EPI-PEN) injection 0.3 mg  0.3 mg Intramuscular Once PRN Creig Hines, MD       escitalopram (LEXAPRO) tablet 10 mg  10 mg Oral Daily Mikey College T, MD   10 mg at 02/12/23 1306   famotidine (PEPCID) IVPB 20 mg premix  20 mg Intravenous Once PRN Creig Hines, MD       fentaNYL (DURAGESIC) 12 MCG/HR 1 patch  1 patch Transdermal Q72H Enedina Finner, MD   1 patch at 02/11/23 1004   gabapentin (NEURONTIN) capsule 100 mg  100 mg Oral BID Mikey College T, MD   100 mg at 02/12/23 1306   heparin injection 5,000 Units  5,000 Units Subcutaneous Q12H Mikey College T, MD   5,000 Units at 02/12/23 1306   heparin lock flush 100 unit/mL  500 Units Intracatheter Once PRN Creig Hines, MD       heparin lock flush 100 unit/mL  250 Units Intracatheter Once PRN Creig Hines, MD       HYDROmorphone (DILAUDID) tablet 1-2 mg  1-2 mg Oral Q4H PRN Lequisha Cammack, Daryl Eastern, NP   2 mg at 02/12/23 1311   levothyroxine (SYNTHROID) tablet 250 mcg  250 mcg Oral QAC breakfast Mikey College T, MD    250 mcg at 02/12/23 1914   loratadine (CLARITIN) tablet 10 mg  10 mg Oral Daily Mikey College T, MD   10 mg at 02/12/23 1306   methylPREDNISolone sodium succinate (SOLU-MEDROL) 125 mg/2 mL injection 125 mg  125 mg Intravenous Once PRN Creig Hines, MD       mometasone-formoterol [facility]) 200-5 MCG/ACT inhaler 2 puff  2 puff Inhalation BID Mikey College T, MD   2 puff at 02/12/23 1311   mupirocin ointment (BACTROBAN) 2 % 1 Application  1 Application Nasal BID Emeline General, MD   1 Application at 02/11/23 2141   pantoprazole (PROTONIX) EC tablet 40 mg  40 mg Oral Daily Mikey College T, MD   40 mg at 02/12/23 1310   polyethylene glycol (MIRALAX / GLYCOLAX) packet 17 g  17 g Oral Daily Madysin Crisp, Daryl Eastern, NP   17 g at 02/12/23 1306   sodium chloride flush (NS) 0.9 % injection 10 mL  10 mL Intracatheter PRN Creig Hines, MD       sodium chloride flush (NS) 0.9 % injection 3 mL  3 mL Intracatheter PRN Creig Hines, MD       umeclidinium bromide (INCRUSE ELLIPTA) 62.5 MCG/ACT 1 puff  1 puff Inhalation Daily Mikey College T, MD   1 puff at 02/12/23 0857   zolpidem (AMBIEN) tablet 5-10 mg  5-10 mg Oral QHS PRN Emeline General, MD        VITAL SIGNS: BP (!) 140/72 (BP Location: Right Arm)   Pulse 82   Temp (!) 97.5 F (36.4 C) Comment: P/t just arrived back from procedure  Resp 16   Ht 5\' 2"  (1.575 m)   Wt 195 lb (88.5 kg)   SpO2 100%   BMI 35.67 kg/m  Filed Weights   02/10/23 1805 02/12/23 1053  Weight: 180 lb (81.6 kg) 195 lb (88.5 kg)    Estimated body mass index is 35.67 kg/m as calculated from the following:   Height as of this encounter: 5\' 2"  (1.575 m).   Weight as of this encounter: 195 lb (88.5 kg).  LABS: CBC:    Component Value Date/Time   WBC 14.3 (H) 02/10/2023 0906   HGB 8.4 (L) 02/10/2023 0906   HGB 13.0 02/14/2012 1528   HCT 25.2 (L) 02/10/2023 0906   HCT 38.5 07/02/2011 1529   PLT 439 (H) 02/10/2023 0906   PLT 311 07/02/2011 1529   MCV 94.4 02/10/2023 0906   MCV 90  07/02/2011 1529   NEUTROABS 10.4 (H) 02/10/2023 0906   LYMPHSABS 2.7 02/10/2023 0906   MONOABS 1.0 02/10/2023 0906   EOSABS 0.1 02/10/2023 0906   BASOSABS 0.0 02/10/2023 0906   Comprehensive Metabolic Panel:    Component Value Date/Time   NA 126 (L) 02/12/2023 0155   K 5.2 (H) 02/12/2023 0155   CL 98 02/12/2023 0155   CO2 17 (L) 02/12/2023 0155   BUN 62 (H) 02/12/2023 0155   CREATININE 5.09 (H) 02/12/2023 0155   GLUCOSE 201 (H) 02/12/2023 0155   CALCIUM 7.9 (L) 02/12/2023 0155   AST 18 02/10/2023 0906   ALT 16 02/10/2023 0906   ALKPHOS 60 02/10/2023 0906   BILITOT 0.3 02/10/2023 0906   PROT 8.2 (H) 02/10/2023 0906   ALBUMIN 2.1 (L) 02/10/2023 0906    RADIOGRAPHIC STUDIES: NM PET Image Initial (PI) Whole Body (F-18 FDG)  Result Date: 02/12/2023 CLINICAL DATA:  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 EXAM: NUCLEAR MEDICINE PET WHOLE BODY TECHNIQUE: 10.1 mCi F-18 FDG was injected intravenously. Full-ring PET imaging was performed from the head to foot after the radiotracer. CT data was obtained and used for attenuation correction and anatomic localization. Fasting blood glucose: 100 mg/dl COMPARISON:  CT chest 69/62/9528 FINDINGS: Mediastinal blood pool activity: SUV max HEAD/NECK: No hypermetabolic activity in the scalp. No hypermetabolic cervical lymph nodes. Incidental CT findings: none CHEST: At the LEFT paraspinal resection site (T6-T7), there is residual fluid and soft tissue density tissue  measuring 6.1 x 3.7 cm. This tissue has no significant metabolic activity. Activity is equal to background blood pool activity. Posterior fusion at this level. No hypermetabolic tissue within the thorax to suggest malignancy within the bones or soft tissue. There are several small nodules in the LEFT lung apex (image 54/6) without metabolic activity. These are new from comparison CT 01/24/2023 and therefore favored foci of  infection or inflammation. Small bilateral pleural effusions are present greater on the LEFT. Incidental CT findings: none ABDOMEN/PELVIS: There is moderate metabolic activity through the GE junction. There is postsurgical anatomy of the proximal stomach. No abnormal metabolic activity liver. Scattered physiologic activity in the bowel. No hypermetabolic lymph nodes in the abdomen pelvis. Incidental CT findings: Foley catheter in bladder. SKELETON: Hypermetabolic lesion in the L4 vertebral body with SUV max equal 5.4. associated lytic lesion measuring 19 mm on image 121/series 6. EXTREMITIES: No abnormal hypermetabolic activity in the lower extremities. Incidental CT findings: No sclerotic or lytic lesions on CT imaging. IMPRESSION: 1. Solitary hypermetabolic lytic lesion in the L4 vertebral body consistent multiple myeloma. Metabolic activity is moderate. 2. Residual soft tissue density at the LEFT paraspinal resection site (T6-T7) with no significant metabolic activity. Favor benign postsurgical tissue. 3. Small bilateral pleural effusions greater on the LEFT. 4. Small nodules in the LEFT lung apex without metabolic activity are favored foci of infection or inflammation. 5. Moderate metabolic activity through the GE junction is favored physiologic or inflammatory. Electronically Signed   By: Genevive Bi M.D.   On: 02/12/2023 13:47   US RENAL  Result Date: 02/10/2023 CLINICAL DATA:  AKI EXAM: RENAL / URINARY TRACT ULTRASOUND COMPLETE COMPARISON:  None Available. FINDINGS: Right Kidney: Renal measurements: 12.5 cm x 5.6 cm x 4.6 cm = volume: 165 mL. Parenchymal echogenicity is normal. There is no hydronephrosis. Left Kidney: Renal measurements: 12.6 cm x 6.4 cm x 5.0 cm = volume: 213 mL. Parenchymal echogenicity is normal. There is no hydronephrosis. Bladder: Decompressed by a Foley catheter. Other: None. IMPRESSION: Unremarkable renal ultrasound. Electronically Signed   By: Lesia Hausen M.D.   On:  02/10/2023 17:03   DG Chest 1 View  Result Date: 02/10/2023 CLINICAL DATA:  Acute kidney injury. Sodium 120. Elevated creatinine. EXAM: CHEST  1 VIEW COMPARISON:  02/01/2023 FINDINGS: Shallow inspiration. Cardiac enlargement. Hazy interstitial infiltrates throughout both lungs most likely representing edema although possibly interstitial pneumonia or chronic fibrosis. Small left pleural effusion with basilar atelectasis or consolidation. This could indicate pneumonia in the appropriate clinical setting. No pneumothorax. Postoperative changes in the thoracic spine. No significant change since prior study. IMPRESSION: Cardiac enlargement with probable interstitial pulmonary edema. Small left pleural effusion with basilar atelectasis or consolidation. Similar appearance to previous study. Electronically Signed   By: Burman Nieves M.D.   On: 02/10/2023 15:50   IR BONE MARROW BIOPSY & ASPIRATION  Result Date: 02/05/2023 INDICATION: Hematologic abnormality EXAM: Bone marrow aspiration and core biopsy using fluoroscopic guidance MEDICATIONS: None. ANESTHESIA/SEDATION: Moderate (conscious) sedation was employed during this procedure. A total of Versed 1.5 mg and Fentanyl 75 mcg was administered intravenously. Moderate Sedation Time: 10 minutes. The patient's level of consciousness and vital signs were monitored continuously by radiology nursing throughout the procedure under my direct supervision. FLUOROSCOPY TIME:  Fluoroscopy Time: 0.7 minutes (13 mGy) COMPLICATIONS: None immediate. PROCEDURE: Informed written consent was obtained from the patient after a thorough discussion of the procedural risks, benefits and alternatives. All questions were addressed. Maximal Sterile Barrier Technique was utilized including  caps, mask, sterile gowns, sterile gloves, sterile drape, hand hygiene and skin antiseptic. A timeout was performed prior to the initiation of the procedure. The patient was placed prone on the exam  table. Limited fluoroscopy of the pelvis was performed for planning purposes. Skin entry site was marked, and the overlying skin was prepped and draped in the standard sterile fashion. Local analgesia was obtained with 1% lidocaine. Using fluoroscopic guidance, an 11 gauge needle was advanced just deep to the cortex of the right posterior ilium. Subsequently, bone marrow aspiration and core biopsy were performed. Specimens were submitted to lab/pathology for handling. Hemostasis was achieved with manual pressure, and a clean dressing was placed. The patient tolerated the procedure well without immediate complication. IMPRESSION: Successful bone marrow aspiration and core biopsy of the right posterior ilium. Electronically Signed   By: Olive Bass M.D.   On: 02/05/2023 15:35   CT HEAD WO CONTRAST ( )  Result Date: 02/03/2023 CLINICAL DATA:  Mental status change, unknown cause. Acute hypoxic respiratory failure EXAM: CT HEAD WITHOUT CONTRAST TECHNIQUE: Contiguous axial images were obtained from the base of the skull through the vertex without intravenous contrast. RADIATION DOSE REDUCTION: This exam was performed according to the departmental dose-optimization program which includes automated exposure control, adjustment of the mA and/or kV according to patient size and/or use of iterative reconstruction technique. COMPARISON:  Bone survey 10/09/2022. FINDINGS: Brain: No acute intracranial abnormality. Specifically, no hemorrhage, hydrocephalus, mass lesion, acute infarction, or significant intracranial injury. Vascular: No hyperdense vessel or unexpected calcification. Skull: Focal lucent lesions throughout the skull. 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 These lytic lesions were not in the skull on the skull x-ray from 10/09/2022. Appearance is concerning for possible multiple myeloma. No fracture. Sinuses/Orbits: No acute findings Other: None IMPRESSION: No acute  intracranial abnormality. Numerous scattered focal lucent lesions throughout the skull. These are new since prior bone survey from 10/09/2022 and concerning for multiple myeloma. Electronically Signed   By: Charlett Nose M.D.   On: 02/03/2023 16:30   DG Chest Port 1 View  Result Date: 02/01/2023 CLINICAL DATA:  Hypoxia EXAM: PORTABLE CHEST 1 VIEW COMPARISON:  01/30/2023 FINDINGS: Bilateral diffuse interstitial thickening and patchy alveolar airspace opacities at the right lung base and left mid lower lung. No pleural effusion or pneumothorax. Stable cardiomediastinal silhouette. No acute osseous abnormality. Posterior spinal fusion of the upper and midthoracic spine. IMPRESSION: 1. Bilateral diffuse interstitial thickening and patchy alveolar airspace opacities at the right lung base and left mid lower lung concerning for pulmonary edema. Electronically Signed   By: Elige Ko M.D.   On: 02/01/2023 10:13   ECHOCARDIOGRAM COMPLETE  Result Date: 01/31/2023    ECHOCARDIOGRAM REPORT   Patient Name:   JAIRE BECHERER Date of Exam: 01/31/2023 Medical Rec #:  [id number]      Height:       62.0 in Accession #:    [id number]     Weight:       170.0 lb Date of Birth:  [birth year]      BSA:          1.784 m Patient Age:    57 years       BP:           106/57 mmHg Patient Gender: F              HR:           110 bpm. Exam Location:  [facility] Procedure:  2D Echo, Cardiac Doppler and Color Doppler Indications:     Dyspnea  History:         Patient has no prior history of Echocardiogram examinations.                  Signs/Symptoms:Dyspnea, Chest Pain, Fatigue and Shortness of                  Breath; Risk Factors:Hypertension, Sleep Apnea and Current                  Smoker.  Sonographer:     Mikki Harbor Referring Phys:  [id number] Alford Highland Diagnosing Phys: Julien Nordmann MD  Sonographer Comments: Image acquisition challenging due to respiratory motion. IMPRESSIONS  1. Left ventricular ejection fraction, by estimation, is  60 to 65%. The left ventricle has normal function. The left ventricle has no regional wall motion abnormalities. There is mild left ventricular hypertrophy. Left ventricular diastolic parameters are consistent with Grade I diastolic dysfunction (impaired relaxation).  2. Right ventricular systolic function is normal. The right ventricular size is normal. There is normal pulmonary artery systolic pressure. The estimated right ventricular systolic pressure is 30.9 mmHg.  3. Left atrial size was mildly dilated.  4. The mitral valve is normal in structure. No evidence of mitral valve regurgitation. No evidence of mitral stenosis.  5. The aortic valve is tricuspid. Aortic valve regurgitation is not visualized. No aortic stenosis is present.  6. The inferior vena cava is normal in size with greater than 50% respiratory variability, suggesting right atrial pressure of 3 mmHg. FINDINGS  Left Ventricle: Left ventricular ejection fraction, by estimation, is 60 to 65%. The left ventricle has normal function. The left ventricle has no regional wall motion abnormalities. The left ventricular internal cavity size was normal in size. There is  mild left ventricular hypertrophy. Left ventricular diastolic parameters are consistent with Grade I diastolic dysfunction (impaired relaxation). Right Ventricle: The right ventricular size is normal. No increase in right ventricular wall thickness. Right ventricular systolic function is normal. There is normal pulmonary artery systolic pressure. The tricuspid regurgitant velocity is 2.64 m/s, and  with an assumed right atrial pressure of 3 mmHg, the estimated right ventricular systolic pressure is 30.9 mmHg. Left Atrium: Left atrial size was mildly dilated. Right Atrium: Right atrial size was normal in size. Pericardium: There is no evidence of pericardial effusion. Mitral Valve: The mitral valve is normal in structure. No evidence of mitral valve regurgitation. No evidence of mitral valve  stenosis. MV peak gradient, 8.0 mmHg. The mean mitral valve gradient is 4.0 mmHg. Tricuspid Valve: The tricuspid valve is normal in structure. Tricuspid valve regurgitation is mild . No evidence of tricuspid stenosis. Aortic Valve: The aortic valve is tricuspid. Aortic valve regurgitation is not visualized. No aortic stenosis is present. Aortic valve mean gradient measures 9.0 mmHg. Aortic valve peak gradient measures 18.4 mmHg. Aortic valve area, by VTI measures 3.04  cm. Pulmonic Valve: The pulmonic valve was normal in structure. Pulmonic valve regurgitation is not visualized. No evidence of pulmonic stenosis. Aorta: The aortic root is normal in size and structure. Venous: The inferior vena cava is normal in size with greater than 50% respiratory variability, suggesting right atrial pressure of 3 mmHg. IAS/Shunts: No atrial level shunt detected by color flow Doppler.  LEFT VENTRICLE PLAX 2D LVIDd:         5.80 cm   Diastology LVIDs:         3.60 cm  LV e' medial:    9.36 cm/s LV PW:         0.90 cm   LV E/e' medial:  9.7 LV IVS:        1.00 cm   LV e' lateral:   10.00 cm/s LVOT diam:     2.20 cm   LV E/e' lateral: 9.1 LV SV:         99 LV SV Index:   55 LVOT Area:     3.80 cm  RIGHT VENTRICLE RV Basal diam:  3.45 cm RV Mid diam:    3.00 cm RV S prime:     27.40 cm/s TAPSE (M-mode): 3.3 cm LEFT ATRIUM             Index        RIGHT ATRIUM           Index LA diam:        4.80 cm 2.69 cm/m   RA Area:     18.90 cm LA Vol (A2C):   59.6 ml 33.41 ml/m  RA Volume:   54.10 ml  30.32 ml/m LA Vol (A4C):   66.9 ml 37.50 ml/m LA Biplane Vol: 65.0 ml 36.43 ml/m  AORTIC VALVE AV Area (Vmax):    3.09 cm AV Area (Vmean):   3.04 cm AV Area (VTI):     3.04 cm AV Vmax:           214.50 cm/s AV Vmean:          136.750 cm/s AV VTI:            0.326 m AV Peak Grad:      18.4 mmHg AV Mean Grad:      9.0 mmHg LVOT Vmax:         174.50 cm/s LVOT Vmean:        109.500 cm/s LVOT VTI:          0.260 m LVOT/AV VTI ratio: 0.80   AORTA Ao Root diam: 3.20 cm MITRAL VALVE                TRICUSPID VALVE MV Area (PHT): 3.72 cm     TR Peak grad:   27.9 mmHg MV Area VTI:   2.90 cm     TR Vmax:        264.00 cm/s MV Peak grad:  8.0 mmHg MV Mean grad:  4.0 mmHg     SHUNTS MV Vmax:       1.41 m/s     Systemic VTI:  0.26 m MV Vmean:      91.6 cm/s    Systemic Diam: 2.20 cm MV Decel Time: 204 msec MV E velocity: 90.70 cm/s MV A velocity: 117.00 cm/s MV E/A ratio:  0.78 Julien Nordmann MD Electronically signed by Julien Nordmann MD Signature Date/Time: 01/31/2023/5:43:31 PM    Final    DG Thoracic Spine 2 View  Result Date: 01/31/2023 CLINICAL DATA:  Back pain. 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 EXAM: THORACIC SPINE 2 VIEWS COMPARISON:  MRI of the cervicothoracic spine 01/24/2023. Intraoperative imaging of the thoracic spine 01/28/2023. Chest CTA 01/24/2023. 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 Counting superiorly from the T12 level, the patient has undergone midthoracic spinal decompression with posterior pedicle screw and rod fusion from T4 through T9. The hardware appears intact and well positioned. Chronic superior endplate compression deformity at T11 is unchanged. No acute osseous complications  are identified. There is mild atelectasis at both lung bases. IMPRESSION: No demonstrated acute findings status post recent midthoracic spinal decompression and fusion. Electronically Signed   By: Carey Bullocks M.D.   On: 01/31/2023 16:27   DG Chest Port 1 View  Result Date: 01/30/2023 CLINICAL DATA:  Cough, shortness of breath EXAM: PORTABLE CHEST 1 VIEW COMPARISON:  01/24/2023 FINDINGS: Cardiomegaly. Diffuse bilateral interstitial pulmonary opacity and trace pleural effusions. Interval thoracic fusion. IMPRESSION: Cardiomegaly with diffuse bilateral interstitial pulmonary opacity and trace pleural effusions, most consistent with edema. Electronically  Signed   By: Jearld Lesch M.D.   On: 01/30/2023 14:07   DG Thoracic Spine 2 View  Result Date: 01/28/2023 CLINICAL DATA:  Elective surgery. T4 through T9 fusion of biopsy for lesion with intra 3D imaging. EXAM: THORACIC SPINE 2 VIEWS COMPARISON:  Preoperative imaging. FINDINGS: Intraoperative 3D imaging of the thoracic spine obtained. Posterior rod with pedicle screws at multiple levels. Known paravertebral mass better assessed on preoperative imaging. No fluoroscopic spot views provided. Fluoroscopy time 1 minutes 30 seconds. Dose is 97.045 mGy IMPRESSION: Intraoperative 3D imaging of the thoracic spine. Electronically Signed   By: Narda Rutherford M.D.   On: 01/28/2023 16:28   DG C-Arm 1-60 Min-No Report  Result Date: 01/28/2023 Fluoroscopy was utilized by the requesting physician.  No radiographic interpretation.   DG C-Arm 1-60 Min-No Report  Result Date: 01/28/2023 Fluoroscopy was utilized by the requesting physician.  No radiographic interpretation.   DG C-Arm 1-60 Min-No Report  Result Date: 01/28/2023 Fluoroscopy was utilized by the requesting physician.  No radiographic interpretation.   DG C-Arm 1-60 Min-No Report  Result Date: 01/28/2023 Fluoroscopy was utilized by the requesting physician.  No radiographic interpretation.   MR THORACIC SPINE W WO CONTRAST  Result Date: 01/24/2023 CLINICAL DATA:  Soft tissue mass with bony destruction involving the left seventh rib and T6 and T7 vertebral bodies. EXAM: MRI CERVICAL AND THORACIC SPINE WITHOUT AND WITH CONTRAST TECHNIQUE: Multiplanar and multiecho pulse sequences of the cervical spine, to include the craniocervical junction and cervicothoracic junction, and the thoracic spine, were obtained without and with intravenous contrast. CONTRAST:  7mL GADAVIST GADOBUTROL 1 MMOL/ML IV SOLN COMPARISON:  Same day CTA chest, cervical and thoracic spine MRI 07/20/2020 FINDINGS: MRI CERVICAL SPINE FINDINGS Alignment: There is trace retrolisthesis  of C3 on C4 and trace anterolisthesis of C5 on C6. Vertebrae: Vertebral body heights are preserved. Background marrow signal is normal. A T1 hypointense/T2 hyperintense lesion in the clivus is unchanged since 2022, favored benign. There is no suspicious marrow signal abnormality or marrow edema in the cervical spine. There is no abnormal marrow enhancement in the cervical spine. There is a 6 mm enhancing lesion in the right occipital calvarium which was not seen on the prior cervical spine MRI from 2022; however, this area may not has been included within the field of view. Cord: Normal in signal and morphology. Posterior Fossa, vertebral arteries, paraspinal tissues: The imaged posterior fossa is unremarkable. The vertebral artery flow voids are normal. The paraspinal soft tissues are unremarkable. Disc levels: There is overall mild degenerative change in the cervical spine without significant spinal canal or neural foraminal stenosis. MRI THORACIC SPINE FINDINGS Alignment:  Normal. Vertebrae: Background marrow signal is normal. There is infiltrative T1 hypointensity occupying most of the T6 and T7 vertebral bodies extending into the left pedicles and facets. There is a bulky soft tissue mass in the adjacent left paraspinal/subpleural soft tissues with destruction of the seventh rib.  The bulk of the soft tissue component measures up to proximally 7.2 cm x 3.4 cm x 2.7 cm. 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 There is mild loss of vertebral body height at both levels consistent with associated pathologic fractures. There is compression deformity of the T11 vertebral body with up to approximately 30% loss of vertebral body height anteriorly and minimal bony retropulsion. There is  faint edema along the superior endplate consistent with acute to subacute chronicity. This fracture may be pathologic. There are probable additional lesions involving multiple additional ribs (for example 25-21, 25-24). The above findings are new since the MRI from 2022. Cord:  There is no cord signal abnormality or abnormal enhancement. Paraspinal and other soft tissues: Unremarkable, aside from the bulky paraspinal tumor described above. Disc levels: There is a small disc protrusion at T10-T11 without significant spinal canal or neural foraminal stenosis. Otherwise, there is overall minimal background degenerative change without significant spinal canal or neural foraminal stenosis. IMPRESSION: 1. Large soft tissue mass in the left paraspinal soft tissues at T6-T7 with destruction of the seventh rib and invasion of the T6 and T7 vertebral bodies with involvement of the left posterior elements as well as epidural tumor in the spinal canal resulting in moderate spinal canal stenosis without frank cord compression or cord edema. 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 2. Acute to subacute compression fracture of the T11 vertebral body with up to approximately 30% loss of vertebral body height and minimal bony retropulsion, possibly pathologic. 3. Small enhancing lesion in the right occipital calvarium, not definitely present on the prior cervical spine from 2022 (though possibly not included within the field of view on that study), suspicious for an additional metastatic or myeloma lesion. 4. Additional probable lesions involving multiple additional ribs. 5. No acute or suspicious finding in the cervical spine. Electronically Signed   By: Lesia Hausen M.D.   On: 01/24/2023 14:33   MR Cervical Spine W and Wo Contrast  Result Date: 01/24/2023 CLINICAL DATA:  Soft tissue mass with bony destruction involving the left seventh rib and T6 and T7 vertebral bodies.  EXAM: MRI CERVICAL AND THORACIC SPINE WITHOUT AND WITH CONTRAST TECHNIQUE: Multiplanar and multiecho pulse sequences of the cervical spine, to include the craniocervical junction and cervicothoracic junction, and the thoracic spine, were obtained without and with intravenous contrast. CONTRAST:  7mL GADAVIST GADOBUTROL 1 MMOL/ML IV SOLN COMPARISON:  Same day CTA chest, cervical and thoracic spine MRI 07/20/2020 FINDINGS: MRI CERVICAL SPINE FINDINGS Alignment: There is trace retrolisthesis of C3 on C4 and trace anterolisthesis of C5 on C6. Vertebrae: Vertebral body heights are preserved. Background marrow signal is normal. A T1 hypointense/T2 hyperintense lesion in the clivus is unchanged since 2022, favored benign. There is no suspicious marrow signal abnormality or marrow edema in the cervical spine. There is no abnormal marrow enhancement in the cervical spine. There is a 6 mm enhancing lesion in the right occipital calvarium which was not seen on the prior cervical spine MRI from 2022; however, this area may not has been included within the field of view. Cord: Normal in signal and morphology. Posterior Fossa, vertebral arteries, paraspinal tissues: The imaged posterior fossa is unremarkable. The vertebral artery flow voids are normal.  The paraspinal soft tissues are unremarkable. Disc levels: There is overall mild degenerative change in the cervical spine without significant spinal canal or neural foraminal stenosis. MRI THORACIC SPINE FINDINGS Alignment:  Normal. Vertebrae: Background marrow signal is normal. There is infiltrative T1 hypointensity occupying most of the T6 and T7 vertebral bodies extending into the left pedicles and facets. There is a bulky soft tissue mass in the adjacent left paraspinal/subpleural soft tissues with destruction of the seventh rib. The bulk of the soft tissue component measures up to proximally 7.2 cm x 3.4 cm x 2.7 cm. There is extension into the T6-T7 and T7-T8 neural foramina  as well as epidural tumor along the ventral and left dorsal lateral aspects of the spinal canal resulting in moderate spinal canal stenosis with effacement of the thecal sac but no frank cord compression or cord edema. There is mild loss of vertebral body height at both levels consistent with associated pathologic fractures. There is compression deformity of the T11 vertebral body with up to approximately 30% loss of vertebral body height anteriorly and minimal bony retropulsion. There is faint edema along the superior endplate consistent with acute to subacute chronicity. This fracture may be pathologic. There are probable additional lesions involving multiple additional ribs (for example 25-21, 25-24). The above findings are new since the MRI from 2022. Cord:  There is no cord signal abnormality or abnormal enhancement. Paraspinal and other soft tissues: Unremarkable, aside from the bulky paraspinal tumor described above. Disc levels: There is a small disc protrusion at T10-T11 without significant spinal canal or neural foraminal stenosis. Otherwise, there is overall minimal background degenerative change without significant spinal canal or neural foraminal stenosis. IMPRESSION: 1. Large soft tissue mass in the left paraspinal soft tissues at T6-T7 with destruction of the seventh rib and invasion of the T6 and T7 vertebral bodies with involvement of the left posterior elements as well as epidural tumor in the spinal canal resulting in moderate spinal canal stenosis without frank cord compression or cord edema. 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 2. Acute to subacute compression fracture of the T11 vertebral body with up to approximately 30% loss of vertebral body height and minimal bony retropulsion, possibly pathologic. 3. Small enhancing lesion in the right occipital calvarium, not definitely present on the prior cervical spine from 2022 (though  possibly not included within the field of view on that study), suspicious for an additional metastatic or myeloma lesion. 4. Additional probable lesions involving multiple additional ribs. 5. No acute or suspicious finding in the cervical spine. Electronically Signed   By: Lesia Hausen M.D.   On: 01/24/2023 14:33   CT Angio Chest PE W and/or Wo Contrast  Result Date: 01/24/2023 CLINICAL DATA:  Chest pain and shortness of breath for a month. EXAM: CT ANGIOGRAPHY CHEST WITH CONTRAST TECHNIQUE: Multidetector CT imaging of the chest was performed using the standard protocol during bolus administration of intravenous contrast. Multiplanar CT image reconstructions and MIPs were obtained to evaluate the vascular anatomy. RADIATION DOSE REDUCTION: This exam was performed according to the departmental dose-optimization program which includes automated exposure control, adjustment of the mA and/or kV according to patient size and/or use of iterative reconstruction technique. CONTRAST:  75mL OMNIPAQUE IOHEXOL 350 MG/ML SOLN COMPARISON:  X-ray 01/24/2023 and older.  Previous CT January 2017 FINDINGS: Cardiovascular: The thoracic aorta has a normal course and caliber with minimal calcified plaque. There is a bovine type aortic arch, normal variant. Coronary artery calcifications are  seen. Please correlate for other coronary risk factors. Heart is nonenlarged. There is slight wall thickening of the left ventricle. Prominent fat along the intra-atrial septum. Pulmonary arteries show some slight enlargement centrally. Please correlate for any evidence of pulmonary artery hypertension. There is heterogeneous enhancement of the pulmonary arterial tree limiting evaluation for emboli. No obvious large and central embolus although several areas are nondiagnostic. Mediastinum/Nodes: Surgical changes identified with a moderate hiatal hernia. Small thyroid gland. No specific abnormal lymph node enlargement identified in the axillary  region, hilum or mediastinum. There are some small nodes identified in the posterior mediastinum, posterior to the descending thoracic aorta such as series 4, image 86 but not pathologic by size criteria. Lungs/Pleura: There is some linear opacity lung bases likely scar or atelectasis. No consolidation. Breathing motion identified. No pleural effusion or pneumothorax. 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 Benign hepatic cysts as well. Musculoskeletal: There is large destructive mass involving the posterior aspect of the left seventh rib with extension to involve the central canal of the spine in the associated vertebral bodies of T6 and T7. Cord compression is possible. This mass is estimated in diameter on series 4, image 65 at 8.5 by 4.3 cm. Slight extension along the extrapleural space in the posterior mediastinum as well. There is question of some subtle other lucent lesions identified along the spine but indeterminate. Critical Value/emergent results were called by telephone at the time of interpretation on 01/24/2023 at 8:41 am to provider [facility] , who verbally acknowledged these results. Review of the MIP images confirms the above findings. IMPRESSION: Aggressive soft tissue mass with bone destruction involving the left seventh rib as well as the T6 and T7 vertebral bodies and central canal. A neoplastic process is possible. There also is a possibility of significant cord compression with canal encroachment along the spine. Recommend further evaluation with spine MRI with and without contrast and a whole-body bone scan. There appears to be some other subtle lucent bone lesions along the spine and sternum. Please correlate for any history of known malignancy including such processes as myeloma or other. Poor opacification of the pulmonary arterial tree limiting evaluation. Several areas are non diagnostic for pulmonary emboli. No obvious  large and central embolus. Aortic Atherosclerosis (ICD10-I70.0). Electronically Signed   By: Karen Kays M.D.   On: 01/24/2023 11:47   DG Chest 2 View  Result Date: 01/24/2023 CLINICAL DATA:  Chest pain EXAM: CHEST - 2 VIEW COMPARISON:  Chest x-ray dated January 07, 2023 FINDINGS: The heart size and mediastinal contours are within normal limits. Small hiatal hernia. Both lungs are clear. Pleural-based nodular opacity of the left posterior hemithorax. The visualized skeletal structures are unremarkable. IMPRESSION: Pleural-based nodular opacity of the left posterior hemithorax. Recommend further evaluation with contrast enhanced chest CT. Electronically Signed   By: Allegra Lai M.D.   On: 01/24/2023 09:38    PERFORMANCE STATUS (ECOG) : 1 - Symptomatic but completely ambulatory  Review of Systems Unless otherwise noted, a complete review of systems is negative.  Physical Exam General: NAD Cardiovascular: regular rate and rhythm Pulmonary: clear ant fields Abdomen: soft, nontender, + bowel sounds GU: no suprapubic tenderness Extremities: no edema, no joint deformities Skin: no rashes Neurological: Weakness but otherwise nonfocal  IMPRESSION: Follow-up visit.  Renal function has not improved overnight.  She was started on chemotherapy today.  Patient remains hopeful for improvement and is in agreement with current scope of treatment.  Symptomatically,  she continues to endorse back pain.  She has also had intermittent headache today.  She has been receiving IV hydromorphone (5 mg in past 24 hours) with good effect.  Patient currently rates pain as 4 out of 10 and tolerable.  Can consider dose increasing transdermal fentanyl if as needed utilization remains high.  Denies constipation and had bowel movement yesterday.  PLAN: -Continue current scope of treatment -Continue fentanyl/hydromorphone -Daily bowel regimen -Will follow  Case and plan discussed with Dr. Smith Robert   Time Total: 15  minutes  Visit consisted of counseling and education dealing with the complex and emotionally intense issues of symptom management and palliative care in the setting of serious and potentially life-threatening illness.Greater than 50%  of this time was spent counseling and coordinating care related to the above assessment and plan.  Signed by: Laurette Schimke, PhD, NP-C

## 2023-02-12 NOTE — Progress Notes (Signed)
PROGRESS NOTE    Paula Garrett  YQM:[id number] DOB: September 30, 1965 DOA: 02/10/2023 PCP: Smitty Cords, DO  [id number]/[id number]-AA  LOS: 2 days   Brief hospital course:   Assessment & Plan: SERAS KOONS is a 57 y.o. female with multiple medical problems including hypertension, depression, anxiety, history of tobacco abuse, and multiple myeloma, who was hospitalized 01/24/2023 to 02/06/2023 with chest pain and shortness of breath and was found to have a large soft tissue mass in the left T6-T7 paraspinal area with destruction of seventh rib and invasion of T6 and T7 vertebral bodies.  Patient underwent surgery on 8/13 with postop period complicated by pain.  Pathology positive for multiple myeloma.  Patient now readmitted 02/10/2023 with AKI      AKI in the setting of multiple myeloma -- baseline creatinine .93 -- patient came in with creatinine of 5.27--- IV fluids-- 5.3 -- patient making good urine. -- Nephrology consultation with Dr. Wynelle Link.  -- Renal ultrasound no obstructive neuropathy --cont MIVF   Hyperkalemia -- no EKG changes. Patient received hyperkalemia cocktail in the ER. --lokelma 10g x2  Hyponatremia -- suspected due to renal failure   Multiple myeloma -- patient seen by Dr. Smith Robert -- given significant kidney issues from multiple myeloma patient to be started on inpatient chemotherapy  -- Follow further recommendations per Dr. Smith Robert --cont steroid   Recent Mass of spinal cord [facility]) --Neurosurgical procedure done on 8/13 by Dr. Katrinka Blazing.   --Patient had a posterior segmental instrumentation T4-T9, posterior lateral arthrodesis from T4-T9, thoracic laminectomy at T6 and T7.   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  --pain management (switched from home oxycontin to Fentanyl patch, switched from home oxycodone PRN to oral dialudid PRN)   Chronic back pain --pain management (switched from home oxycontin to Fentanyl patch, switched from home oxycodone PRN to oral  dialudid PRN) --cont gabapentin   Asthma COPD -stable --cont daily bronchodilators   Anxiety depression -- continue Lexapro and Klonopin   Hypothyroidism -- continue Synthroid   DVT prophylaxis: Heparin SQ Code Status: Full code  Family Communication: brother updated at bedside today Level of care: Telemetry Medical Dispo:   The patient is from: home Anticipated d/c is to: home Anticipated d/c date is: > 3 days   Subjective and Interval History:  No pain complaint during rounds.  No dyspnea.   Objective: Vitals:   02/12/23 1053 02/12/23 1245 02/12/23 1556 02/12/23 1955  BP:  (!) 140/72 130/67 115/74  Pulse:  82 86 79  Resp:  16 20 18   Temp:  (!) 97.5 F (36.4 C) 97.7 F (36.5 C) 98.3 F (36.8 C)  TempSrc:   Oral Oral  SpO2:  100% 95% 98%  Weight: 88.5 kg     Height:        Intake/Output Summary (Last 24 hours) at 02/12/2023 2027 Last data filed at 02/12/2023 1706 Gross per 24 hour  Intake --  Output 1950 ml  Net -1950 ml   Filed Weights   02/10/23 1805 02/12/23 1053  Weight: 81.6 kg 88.5 kg    Examination:   Constitutional: NAD, AAOx3 HEENT: conjunctivae and lids normal, EOMI CV: No cyanosis.   RESP: normal respiratory effort, on RA Extremities: No effusions, edema in BLE SKIN: warm, dry Neuro: II - XII grossly intact.   Psych: Normal mood and affect.  Appropriate judgement and reason   Data Reviewed: I have personally reviewed labs and imaging studies  Time spent: 50 minutes  Paula Priestly, Paula Garrett [facility] If 7PM-7AM, please contact night-coverage 02/12/2023, 8:27 PM

## 2023-02-12 NOTE — Progress Notes (Signed)
Hematology/Oncology Consult note [facility]  Telephone:([phone] [phone]  Patient Care Team: Smitty Cords, DO as PCP - General (Family Medicine) Lemar Livings, Merrily Pew, MD (General Surgery) Shelia Media, MD (Internal Medicine) Creig Hines, MD as Consulting Physician (Oncology)   Name of the patient: Liona Nimmer  [id number]  1965/07/06   Date of visit: 02/12/2023   Interval history- Occasional headaches but back pain is otherwise well controlled. She denies other complaints including sob  ECOG PS- 1 Pain scale- 3 Opioid associated constipation- no  Review of systems- Review of Systems  Constitutional:  Positive for malaise/fatigue. Negative for chills, fever and weight loss.  HENT:  Negative for congestion, ear discharge and nosebleeds.   Eyes:  Negative for blurred vision.  Respiratory:  Negative for cough, hemoptysis, sputum production, shortness of breath and wheezing.   Cardiovascular:  Negative for chest pain, palpitations, orthopnea and claudication.  Gastrointestinal:  Negative for abdominal pain, blood in stool, constipation, diarrhea, heartburn, melena, nausea and vomiting.  Genitourinary:  Negative for dysuria, flank pain, frequency, hematuria and urgency.  Musculoskeletal:  Negative for back pain, joint pain and myalgias.  Skin:  Negative for rash.  Neurological:  Positive for headaches. Negative for dizziness, tingling, focal weakness, seizures and weakness.  Endo/Heme/Allergies:  Does not bruise/bleed easily.  Psychiatric/Behavioral:  Negative for depression and suicidal ideas. The patient does not have insomnia.       Allergies  Allergen Reactions   Hctz [Hydrochlorothiazide]     Hyponatremia      Past Medical History:  Diagnosis Date   Anxiety    Arthritis    Asthma    Barrett's esophagus 2015   COVID-19    03/10/20   Depression    GERD (gastroesophageal reflux disease)    Hypertension     Hypothyroidism    Pneumonia    Smoldering myeloma    Thyroid disease      Past Surgical History:  Procedure Laterality Date   ABLATION  2006   APPLICATION OF INTRAOPERATIVE CT SCAN N/A 01/28/2023   Procedure: APPLICATION OF INTRAOPERATIVE CT SCAN;  Surgeon: Loreen Freud, MD;  Location: [facility] ORS;  Service: Neurosurgery;  Laterality: N/A;   BREAST CYST ASPIRATION Left 1998   BREAST SURGERY     cyst aspiration   BREAST SURGERY  1998   cyst aspiration   COLONOSCOPY  03/2014   COLONOSCOPY WITH PROPOFOL N/A 01/06/2020   Procedure: COLONOSCOPY WITH PROPOFOL;  Surgeon: Toledo, Boykin Nearing, MD;  Location: [facility] ENDOSCOPY;  Service: Gastroenterology;  Laterality: N/A;   ENDOMETRIAL ABLATION     ESOPHAGOGASTRODUODENOSCOPY (EGD) WITH PROPOFOL N/A 08/15/2015   Procedure: ESOPHAGOGASTRODUODENOSCOPY (EGD) WITH PROPOFOL;  Surgeon: Christena Deem, MD;  Location: [facility] ENDOSCOPY;  Service: Endoscopy;  Laterality: N/A;   ESOPHAGOGASTRODUODENOSCOPY (EGD) WITH PROPOFOL N/A 01/22/2016   Procedure: ESOPHAGOGASTRODUODENOSCOPY (EGD) WITH PROPOFOL;  Surgeon: Christena Deem, MD;  Location: [facility] ENDOSCOPY;  Service: Endoscopy;  Laterality: N/A;   ESOPHAGOGASTRODUODENOSCOPY (EGD) WITH PROPOFOL N/A 01/06/2020   Procedure: ESOPHAGOGASTRODUODENOSCOPY (EGD) WITH PROPOFOL;  Surgeon: Toledo, Boykin Nearing, MD;  Location: [facility] ENDOSCOPY;  Service: Gastroenterology;  Laterality: N/A;   GASTRIC BYPASS  2005   HERNIA REPAIR     IR BONE MARROW BIOPSY & ASPIRATION  02/05/2023   NASAL SINUS SURGERY     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   TOTAL THYROIDECTOMY     TUBAL LIGATION      Social History  Socioeconomic History   Marital status: Divorced    Spouse name: Not on file   Number of children: 2   Years of education: Not on file   Highest education level: GED or equivalent  Occupational History   Occupation: Part-time  Tobacco Use   Smoking status: Every Day    Current packs/day: 1.00    Average  packs/day: 1 pack/day for 42.9 years (42.9 ttl pk-yrs)    Types: Cigarettes    Start date: 03/15/1980   Smokeless tobacco: Never  Vaping Use   Vaping status: Never Used  Substance and Sexual Activity   Alcohol use: Yes    Alcohol/week: 10.0 - 12.0 standard drinks of alcohol    Types: 8 - 10 Glasses of wine, 2 Standard drinks or equivalent per week   Drug use: No   Sexual activity: Yes    Partners: Male  Other Topics Concern   Not on file  Social History Narrative   Lives in [place] single, divorced       Work - 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       Diet - healthy diet   Exercise - limited   Caffeine use: daily   Social Determinants of Health   Financial Resource Strain: Low Risk  (11/01/2022)   Overall Financial Resource Strain (CARDIA)    Difficulty of Paying Living Expenses: Not very hard  Food Insecurity: No Food Insecurity (02/10/2023)   Hunger Vital Sign    Worried About Running Out of Food in the Last Year: Never true    Ran Out of Food in the Last Year: Never true  Transportation Needs: No Transportation Needs (02/10/2023)   PRAPARE - Administrator, Civil Service (Medical): No    Lack of Transportation (Non-Medical): No  Physical Activity: Insufficiently Active (11/01/2022)   Exercise Vital Sign    Days of Exercise per Week: 3 days    Minutes of Exercise per Session: 20 min  Stress: Stress Concern Present (11/01/2022)   [employer] of Occupational Health - Occupational Stress Questionnaire    Feeling of Stress : Very much  Social Connections: Socially Isolated (11/01/2022)   Social Connection and Isolation Panel [NHANES]    Frequency of Communication with Friends and Family: More than three times a week    Frequency of Social Gatherings with Friends and Family: Once a week    Attends Religious Services: Never    Database administrator or Organizations: No    Attends Engineer, structural: Not on file    Marital Status: Divorced   Intimate Partner Violence: Not At Risk (02/10/2023)   Humiliation, Afraid, Rape, and Kick questionnaire    Fear of Current or Ex-Partner: No    Emotionally Abused: No    Physically Abused: No    Sexually Abused: No    Family History  Problem Relation Age of Onset   Heart disease Mother    COPD Mother    Arthritis Mother    Cancer Mother        uterine   Lupus Mother    Anxiety disorder Mother    Depression Mother    Drug abuse Mother    COPD Father    Arthritis Father    Heart disease Father    Heart attack Father    Alcohol abuse Father    Heart disease Brother    Heart attack Brother    Drug abuse Brother    Anxiety disorder Brother  Depression Brother    Heart disease Brother    Cancer Brother        liver cancer age 47 y.o   Diabetes Maternal Grandmother    Diabetes Maternal Grandfather    Diabetes Paternal Grandmother    Diabetes Paternal Grandfather    Breast cancer Neg Hx      Current Facility-Administered Medications:    0.9 %  sodium chloride infusion, , Intravenous, Once PRN, Creig Hines, MD   0.9 %  sodium chloride infusion, , Intravenous, Continuous, Enedina Finner, MD, Last Rate: 150 mL/hr at 02/12/23 0434, New Bag at 02/12/23 0434   albuterol (PROVENTIL) (2.5 MG/3ML) 0.083% nebulizer solution 2.5 mg, 2.5 mg, Nebulization, Once PRN, Creig Hines, MD   albuterol (PROVENTIL) (2.5 MG/3ML) 0.083% nebulizer solution 3 mL, 3 mL, Inhalation, Q6H PRN, Mikey College T, MD   allopurinol (ZYLOPRIM) tablet 100 mg, 100 mg, Oral, Daily, Creig Hines, MD, 100 mg at 02/12/23 1306   alteplase (CATHFLO ACTIVASE) injection 2 mg, 2 mg, Intracatheter, Once PRN, Creig Hines, MD   bisacodyl (DULCOLAX) EC tablet 10 mg, 10 mg, Oral, Daily, Enedina Finner, MD, 10 mg at 02/12/23 1306   Chlorhexidine Gluconate Cloth 2 % PADS 6 each, 6 each, Topical, Daily, Enedina Finner, MD, 6 each at 02/12/23 1306   clonazePAM (KLONOPIN) tablet 0.5 mg, 0.5 mg, Oral, BID, Chipper Herb, Ping T, MD, 0.5  mg at 02/12/23 1311   cyclobenzaprine (FLEXERIL) tablet 5-10 mg, 5-10 mg, Oral, TID PRN, Mikey College T, MD   dexamethasone (DECADRON) tablet 40 mg, 40 mg, Oral, Daily, Creig Hines, MD, 40 mg at 02/12/23 0857   diphenhydrAMINE (BENADRYL) injection 50 mg, 50 mg, Intravenous, Once PRN, Creig Hines, MD   docusate sodium (COLACE) capsule 100 mg, 100 mg, Oral, BID, Chipper Herb, Ping T, MD, 100 mg at 02/12/23 1306   EPINEPHrine (EPI-PEN) injection 0.3 mg, 0.3 mg, Intramuscular, Once PRN, Creig Hines, MD   escitalopram (LEXAPRO) tablet 10 mg, 10 mg, Oral, Daily, Chipper Herb, Ping T, MD, 10 mg at 02/12/23 1306   famotidine (PEPCID) IVPB 20 mg premix, 20 mg, Intravenous, Once PRN, Creig Hines, MD   fentaNYL (DURAGESIC) 12 MCG/HR 1 patch, 1 patch, Transdermal, Q72H, Enedina Finner, MD, 1 patch at 02/11/23 1004   gabapentin (NEURONTIN) capsule 100 mg, 100 mg, Oral, BID, Mikey College T, MD, 100 mg at 02/12/23 1306   heparin injection 5,000 Units, 5,000 Units, Subcutaneous, Q12H, Mikey College T, MD, 5,000 Units at 02/12/23 1306   heparin lock flush 100 unit/mL, 500 Units, Intracatheter, Once PRN, Creig Hines, MD   heparin lock flush 100 unit/mL, 250 Units, Intracatheter, Once PRN, Creig Hines, MD   HYDROmorphone (DILAUDID) tablet 1-2 mg, 1-2 mg, Oral, Q4H PRN, Borders, Daryl Eastern, NP, 2 mg at 02/12/23 1311   levothyroxine (SYNTHROID) tablet 250 mcg, 250 mcg, Oral, QAC breakfast, Mikey College T, MD, 250 mcg at 02/12/23 2536   loratadine (CLARITIN) tablet 10 mg, 10 mg, Oral, Daily, Chipper Herb, Ping T, MD, 10 mg at 02/12/23 1306   methylPREDNISolone sodium succinate (SOLU-MEDROL) 125 mg/2 mL injection 125 mg, 125 mg, Intravenous, Once PRN, Creig Hines, MD   mometasone-formoterol (DULERA) 200-5 MCG/ACT inhaler 2 puff, 2 puff, Inhalation, BID, Mikey College T, MD, 2 puff at 02/12/23 1311   mupirocin ointment (BACTROBAN) 2 % 1 Application, 1 Application, Nasal, BID, Emeline General, MD, 1 Application at 02/11/23 2141    pantoprazole (PROTONIX) EC tablet 40 mg, 40  mg, Oral, Daily, Mikey College T, MD, 40 mg at 02/12/23 1310   polyethylene glycol (MIRALAX / GLYCOLAX) packet 17 g, 17 g, Oral, Daily, Borders, Daryl Eastern, NP, 17 g at 02/12/23 1306   sodium chloride flush (NS) 0.9 % injection 10 mL, 10 mL, Intracatheter, PRN, Creig Hines, MD   sodium chloride flush (NS) 0.9 % injection 3 mL, 3 mL, Intracatheter, PRN, Creig Hines, MD   umeclidinium bromide (INCRUSE ELLIPTA) 62.5 MCG/ACT 1 puff, 1 puff, Inhalation, Daily, Mikey College T, MD, 1 puff at 02/12/23 0857   zolpidem (AMBIEN) tablet 5-10 mg, 5-10 mg, Oral, QHS PRN, Emeline General, MD  Physical exam:  Vitals:   02/12/23 0720 02/12/23 1053 02/12/23 1245 02/12/23 1556  BP: 124/73  (!) 140/72 130/67  Pulse: 73  82 86  Resp: 16  16 20   Temp: 97.9 F (36.6 C)  (!) 97.5 F (36.4 C) 97.7 F (36.5 C)  TempSrc: Oral   Oral  SpO2: 95%  100% 95%  Weight:  195 lb (88.5 kg)    Height:       Physical Exam Cardiovascular:     Rate and Rhythm: Normal rate and regular rhythm.     Heart sounds: Normal heart sounds.  Pulmonary:     Effort: Pulmonary effort is normal.     Breath sounds: Normal breath sounds.  Abdominal:     General: Bowel sounds are normal.     Palpations: Abdomen is soft.  Musculoskeletal:     Right lower leg: No edema.     Left lower leg: No edema.  Skin:    General: Skin is warm and dry.  Neurological:     Mental Status: She is alert and oriented to person, place, and time.         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 EXAM: NUCLEAR MEDICINE PET WHOLE BODY TECHNIQUE: 10.1 mCi F-18 FDG was injected intravenously. Full-ring PET imaging was performed from the head to foot after the radiotracer. CT data was obtained and used for attenuation correction and anatomic localization. Fasting blood glucose: 100 mg/dl COMPARISON:  CT chest 78/46/9629 FINDINGS: Mediastinal blood pool activity: SUV max HEAD/NECK: No hypermetabolic activity in the scalp. No hypermetabolic cervical lymph nodes. Incidental CT findings: none CHEST: At the LEFT paraspinal resection site (T6-T7), there is residual fluid and soft tissue density tissue measuring 6.1 x 3.7 cm. This tissue has no significant metabolic activity. Activity is equal to background blood pool activity. Posterior fusion at this level. No hypermetabolic tissue within the thorax to suggest malignancy within the bones  or soft tissue. There are several small nodules in the LEFT lung apex (image 54/6) without metabolic activity. These are new from comparison CT 01/24/2023 and therefore favored foci of infection or inflammation. Small bilateral pleural effusions are present greater on the LEFT. Incidental CT findings: none ABDOMEN/PELVIS: There is moderate metabolic activity through the GE junction. There is postsurgical anatomy of the proximal stomach. No abnormal metabolic activity liver. Scattered physiologic activity in the bowel. No hypermetabolic lymph nodes in the abdomen pelvis. Incidental CT findings: Foley catheter in bladder. SKELETON: Hypermetabolic lesion in the L4 vertebral body with SUV max equal 5.4. associated lytic lesion measuring 19 mm on image 121/series 6. EXTREMITIES: No abnormal hypermetabolic activity in the lower extremities. Incidental CT  findings: No sclerotic or lytic lesions on CT imaging. IMPRESSION: 1. Solitary hypermetabolic lytic lesion in the L4 vertebral body consistent multiple myeloma. Metabolic activity is moderate. 2. Residual soft tissue density at the LEFT paraspinal resection site (T6-T7) with no significant metabolic activity. Favor benign postsurgical tissue. 3. Small bilateral pleural effusions greater on the LEFT. 4. Small nodules in the LEFT lung apex without metabolic activity are favored foci of infection or inflammation. 5. Moderate metabolic activity through the GE junction is favored physiologic or inflammatory. Electronically Signed   By: Genevive Bi M.D.   On: 02/12/2023 13:47   US RENAL  Result Date: 02/10/2023 CLINICAL DATA:  AKI EXAM: RENAL / URINARY TRACT ULTRASOUND COMPLETE COMPARISON:  None Available. FINDINGS: Right Kidney: Renal measurements: 12.5 cm x 5.6 cm x 4.6 cm = volume: 165 mL. Parenchymal echogenicity is normal. There is no hydronephrosis. Left Kidney: Renal measurements: 12.6 cm x 6.4 cm x 5.0 cm = volume: 213 mL. Parenchymal echogenicity is normal. There is no hydronephrosis. Bladder: Decompressed by a Foley catheter. Other: None. IMPRESSION: Unremarkable renal ultrasound. Electronically Signed   By: Lesia Hausen M.D.   On: 02/10/2023 17:03   DG Chest 1 View  Result Date: 02/10/2023 CLINICAL DATA:  Acute kidney injury. Sodium 120. Elevated creatinine. EXAM: CHEST  1 VIEW COMPARISON:  02/01/2023 FINDINGS: Shallow inspiration. Cardiac enlargement. Hazy interstitial infiltrates throughout both lungs most likely representing edema although possibly interstitial pneumonia or chronic fibrosis. Small left pleural effusion with basilar atelectasis or consolidation. This could indicate pneumonia in the appropriate clinical setting. No pneumothorax. Postoperative changes in the thoracic spine. No significant change since prior study. IMPRESSION: Cardiac enlargement with probable interstitial pulmonary  edema. Small left pleural effusion with basilar atelectasis or consolidation. Similar appearance to previous study. Electronically Signed   By: Burman Nieves M.D.   On: 02/10/2023 15:50   IR BONE MARROW BIOPSY & ASPIRATION  Result Date: 02/05/2023 INDICATION: Hematologic abnormality EXAM: Bone marrow aspiration and core biopsy using fluoroscopic guidance MEDICATIONS: None. ANESTHESIA/SEDATION: Moderate (conscious) sedation was employed during this procedure. A total of Versed 1.5 mg and Fentanyl 75 mcg was administered intravenously. Moderate Sedation Time: 10 minutes. The patient's level of consciousness and vital signs were monitored continuously by radiology nursing throughout the procedure under my direct supervision. FLUOROSCOPY TIME:  Fluoroscopy Time: 0.7 minutes (13 mGy) COMPLICATIONS: None immediate. PROCEDURE: Informed written consent was obtained from the patient after a thorough discussion of the procedural risks, benefits and alternatives. All questions were addressed. Maximal Sterile Barrier Technique was utilized including caps, mask, sterile gowns, sterile gloves, sterile drape, hand hygiene and skin antiseptic. A timeout was performed prior to the initiation of the procedure. The patient was placed prone on the exam table. Limited fluoroscopy of the  pelvis was performed for planning purposes. Skin entry site was marked, and the overlying skin was prepped and draped in the standard sterile fashion. Local analgesia was obtained with 1% lidocaine. Using fluoroscopic guidance, an 11 gauge needle was advanced just deep to the cortex of the right posterior ilium. Subsequently, bone marrow aspiration and core biopsy were performed. Specimens were submitted to lab/pathology for handling. Hemostasis was achieved with manual pressure, and a clean dressing was placed. The patient tolerated the procedure well without immediate complication. IMPRESSION: Successful bone marrow aspiration and core biopsy of  the right posterior ilium. Electronically Signed   By: Olive Bass M.D.   On: 02/05/2023 15:35   CT HEAD WO CONTRAST ( )  Result Date: 02/03/2023 CLINICAL DATA:  Mental status change, unknown cause. Acute hypoxic respiratory failure EXAM: CT HEAD WITHOUT CONTRAST TECHNIQUE: Contiguous axial images were obtained from the base of the skull through the vertex without intravenous contrast. RADIATION DOSE REDUCTION: This exam was performed according to the departmental dose-optimization program which includes automated exposure control, adjustment of the mA and/or kV according to patient size and/or use of iterative reconstruction technique. COMPARISON:  Bone survey 10/09/2022. FINDINGS: Brain: No acute intracranial abnormality. Specifically, no hemorrhage, hydrocephalus, mass lesion, acute infarction, or significant intracranial injury. Vascular: No hyperdense vessel or unexpected calcification. Skull: Focal lucent lesions throughout the skull. 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 These lytic lesions were not in the skull on the skull x-ray from 10/09/2022. Appearance is concerning for possible multiple myeloma. No fracture. Sinuses/Orbits: No acute findings Other: None IMPRESSION: No acute intracranial abnormality. Numerous scattered focal lucent lesions throughout the skull. These are new since prior bone survey from 10/09/2022 and concerning for multiple myeloma. Electronically Signed   By: Charlett Nose M.D.   On: 02/03/2023 16:30   DG Chest Port 1 View  Result Date: 02/01/2023 CLINICAL DATA:  Hypoxia EXAM: PORTABLE CHEST 1 VIEW COMPARISON:  01/30/2023 FINDINGS: Bilateral diffuse interstitial thickening and patchy alveolar airspace opacities at the right lung base and left mid lower lung. No pleural effusion or pneumothorax. Stable cardiomediastinal silhouette. No acute osseous abnormality. Posterior spinal fusion of the upper and midthoracic spine. IMPRESSION: 1.  Bilateral diffuse interstitial thickening and patchy alveolar airspace opacities at the right lung base and left mid lower lung concerning for pulmonary edema. Electronically Signed   By: Elige Ko M.D.   On: 02/01/2023 10:13   ECHOCARDIOGRAM COMPLETE  Result Date: 01/31/2023    ECHOCARDIOGRAM REPORT   Patient Name:   TERESE VI Date of Exam: 01/31/2023 Medical Rec #:  [id number]      Height:       62.0 in Accession #:    [id number]     Weight:       170.0 lb Date of Birth:  Mar 13, 1966      BSA:          1.784 m Patient Age:    57 years       BP:           106/57 mmHg Patient Gender: F              HR:           110 bpm. Exam Location:  [facility] Procedure: 2D Echo, Cardiac Doppler and Color Doppler Indications:     Dyspnea  History:         Patient has no prior history of Echocardiogram examinations.  Signs/Symptoms:Dyspnea, Chest Pain, Fatigue and Shortness of                  Breath; Risk Factors:Hypertension, Sleep Apnea and Current                  Smoker.  Sonographer:     Mikki Harbor Referring Phys:  [id number] Alford Highland Diagnosing Phys: Julien Nordmann MD  Sonographer Comments: Image acquisition challenging due to respiratory motion. IMPRESSIONS  1. Left ventricular ejection fraction, by estimation, is 60 to 65%. The left ventricle has normal function. The left ventricle has no regional wall motion abnormalities. There is mild left ventricular hypertrophy. Left ventricular diastolic parameters are consistent with Grade I diastolic dysfunction (impaired relaxation).  2. Right ventricular systolic function is normal. The right ventricular size is normal. There is normal pulmonary artery systolic pressure. The estimated right ventricular systolic pressure is 30.9 mmHg.  3. Left atrial size was mildly dilated.  4. The mitral valve is normal in structure. No evidence of mitral valve regurgitation. No evidence of mitral stenosis.  5. The aortic valve is tricuspid. Aortic valve  regurgitation is not visualized. No aortic stenosis is present.  6. The inferior vena cava is normal in size with greater than 50% respiratory variability, suggesting right atrial pressure of 3 mmHg. FINDINGS  Left Ventricle: Left ventricular ejection fraction, by estimation, is 60 to 65%. The left ventricle has normal function. The left ventricle has no regional wall motion abnormalities. The left ventricular internal cavity size was normal in size. There is  mild left ventricular hypertrophy. Left ventricular diastolic parameters are consistent with Grade I diastolic dysfunction (impaired relaxation). Right Ventricle: The right ventricular size is normal. No increase in right ventricular wall thickness. Right ventricular systolic function is normal. There is normal pulmonary artery systolic pressure. The tricuspid regurgitant velocity is 2.64 m/s, and  with an assumed right atrial pressure of 3 mmHg, the estimated right ventricular systolic pressure is 30.9 mmHg. Left Atrium: Left atrial size was mildly dilated. Right Atrium: Right atrial size was normal in size. Pericardium: There is no evidence of pericardial effusion. Mitral Valve: The mitral valve is normal in structure. No evidence of mitral valve regurgitation. No evidence of mitral valve stenosis. MV peak gradient, 8.0 mmHg. The mean mitral valve gradient is 4.0 mmHg. Tricuspid Valve: The tricuspid valve is normal in structure. Tricuspid valve regurgitation is mild . No evidence of tricuspid stenosis. Aortic Valve: The aortic valve is tricuspid. Aortic valve regurgitation is not visualized. No aortic stenosis is present. Aortic valve mean gradient measures 9.0 mmHg. Aortic valve peak gradient measures 18.4 mmHg. Aortic valve area, by VTI measures 3.04  cm. Pulmonic Valve: The pulmonic valve was normal in structure. Pulmonic valve regurgitation is not visualized. No evidence of pulmonic stenosis. Aorta: The aortic root is normal in size and structure.  Venous: The inferior vena cava is normal in size with greater than 50% respiratory variability, suggesting right atrial pressure of 3 mmHg. IAS/Shunts: No atrial level shunt detected by color flow Doppler.  LEFT VENTRICLE PLAX 2D LVIDd:         5.80 cm   Diastology LVIDs:         3.60 cm   LV e' medial:    9.36 cm/s LV PW:         0.90 cm   LV E/e' medial:  9.7 LV IVS:        1.00 cm   LV e' lateral:   10.00  cm/s LVOT diam:     2.20 cm   LV E/e' lateral: 9.1 LV SV:         99 LV SV Index:   55 LVOT Area:     3.80 cm  RIGHT VENTRICLE RV Basal diam:  3.45 cm RV Mid diam:    3.00 cm RV S prime:     27.40 cm/s TAPSE (M-mode): 3.3 cm LEFT ATRIUM             Index        RIGHT ATRIUM           Index LA diam:        4.80 cm 2.69 cm/m   RA Area:     18.90 cm LA Vol (A2C):   59.6 ml 33.41 ml/m  RA Volume:   54.10 ml  30.32 ml/m LA Vol (A4C):   66.9 ml 37.50 ml/m LA Biplane Vol: 65.0 ml 36.43 ml/m  AORTIC VALVE AV Area (Vmax):    3.09 cm AV Area (Vmean):   3.04 cm AV Area (VTI):     3.04 cm AV Vmax:           214.50 cm/s AV Vmean:          136.750 cm/s AV VTI:            0.326 m AV Peak Grad:      18.4 mmHg AV Mean Grad:      9.0 mmHg LVOT Vmax:         174.50 cm/s LVOT Vmean:        109.500 cm/s LVOT VTI:          0.260 m LVOT/AV VTI ratio: 0.80  AORTA Ao Root diam: 3.20 cm MITRAL VALVE                TRICUSPID VALVE MV Area (PHT): 3.72 cm     TR Peak grad:   27.9 mmHg MV Area VTI:   2.90 cm     TR Vmax:        264.00 cm/s MV Peak grad:  8.0 mmHg MV Mean grad:  4.0 mmHg     SHUNTS MV Vmax:       1.41 m/s     Systemic VTI:  0.26 m MV Vmean:      91.6 cm/s    Systemic Diam: 2.20 cm MV Decel Time: 204 msec MV E velocity: 90.70 cm/s MV A velocity: 117.00 cm/s MV E/A ratio:  0.78 Julien Nordmann MD Electronically signed by Julien Nordmann MD Signature Date/Time: 01/31/2023/5:43:31 PM    Final    DG Thoracic Spine 2 View  Result Date: 01/31/2023 CLINICAL DATA:  Back pain. 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 EXAM: THORACIC SPINE 2 VIEWS COMPARISON:  MRI of the cervicothoracic spine 01/24/2023. Intraoperative imaging of the thoracic spine 01/28/2023. Chest CTA 01/24/2023. 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 Counting superiorly from the T12 level, the patient has undergone midthoracic spinal decompression with posterior pedicle screw and rod fusion from T4 through T9. The hardware appears intact and well positioned. Chronic superior endplate compression deformity at T11 is unchanged. No acute osseous complications are identified. There is mild atelectasis at both lung bases. IMPRESSION: No demonstrated acute findings status post recent midthoracic spinal decompression and fusion. Electronically Signed   By: Carey Bullocks M.D.   On: 01/31/2023 16:27   DG Chest [facility] 1 View  Result  Date: 01/30/2023 CLINICAL DATA:  Cough, shortness of breath EXAM: PORTABLE CHEST 1 VIEW COMPARISON:  01/24/2023 FINDINGS: Cardiomegaly. Diffuse bilateral interstitial pulmonary opacity and trace pleural effusions. Interval thoracic fusion. IMPRESSION: Cardiomegaly with diffuse bilateral interstitial pulmonary opacity and trace pleural effusions, most consistent with edema. Electronically Signed   By: Jearld Lesch M.D.   On: 01/30/2023 14:07   DG Thoracic Spine 2 View  Result Date: 01/28/2023 CLINICAL DATA:  Elective surgery. T4 through T9 fusion of biopsy for lesion with intra 3D imaging. EXAM: THORACIC SPINE 2 VIEWS COMPARISON:  Preoperative imaging. FINDINGS: Intraoperative 3D imaging of the thoracic spine obtained. Posterior rod with pedicle screws at multiple levels. Known paravertebral mass better assessed on preoperative imaging. No fluoroscopic spot views provided. Fluoroscopy time 1 minutes 30 seconds. Dose is 97.045 mGy IMPRESSION: Intraoperative 3D imaging of the thoracic spine. Electronically Signed   By: Narda Rutherford M.D.   On:  01/28/2023 16:28   DG C-Arm 1-60 Min-No Report  Result Date: 01/28/2023 Fluoroscopy was utilized by the requesting physician.  No radiographic interpretation.   DG C-Arm 1-60 Min-No Report  Result Date: 01/28/2023 Fluoroscopy was utilized by the requesting physician.  No radiographic interpretation.   DG C-Arm 1-60 Min-No Report  Result Date: 01/28/2023 Fluoroscopy was utilized by the requesting physician.  No radiographic interpretation.   DG C-Arm 1-60 Min-No Report  Result Date: 01/28/2023 Fluoroscopy was utilized by the requesting physician.  No radiographic interpretation.   MR THORACIC SPINE W WO CONTRAST  Result Date: 01/24/2023 CLINICAL DATA:  Soft tissue mass with bony destruction involving the left seventh rib and T6 and T7 vertebral bodies. EXAM: MRI CERVICAL AND THORACIC SPINE WITHOUT AND WITH CONTRAST TECHNIQUE: Multiplanar and multiecho pulse sequences of the cervical spine, to include the craniocervical junction and cervicothoracic junction, and the thoracic spine, were obtained without and with intravenous contrast. CONTRAST:  7mL GADAVIST GADOBUTROL 1 MMOL/ML IV SOLN COMPARISON:  Same day CTA chest, cervical and thoracic spine MRI 07/20/2020 FINDINGS: MRI CERVICAL SPINE FINDINGS Alignment: There is trace retrolisthesis of C3 on C4 and trace anterolisthesis of C5 on C6. Vertebrae: Vertebral body heights are preserved. Background marrow signal is normal. A T1 hypointense/T2 hyperintense lesion in the clivus is unchanged since 2022, favored benign. There is no suspicious marrow signal abnormality or marrow edema in the cervical spine. There is no abnormal marrow enhancement in the cervical spine. There is a 6 mm enhancing lesion in the right occipital calvarium which was not seen on the prior cervical spine MRI from 2022; however, this area may not has been included within the field of view. Cord: Normal in signal and morphology. Posterior Fossa, vertebral arteries, paraspinal  tissues: The imaged posterior fossa is unremarkable. The vertebral artery flow voids are normal. The paraspinal soft tissues are unremarkable. Disc levels: There is overall mild degenerative change in the cervical spine without significant spinal canal or neural foraminal stenosis. MRI THORACIC SPINE FINDINGS Alignment:  Normal. Vertebrae: Background marrow signal is normal. There is infiltrative T1 hypointensity occupying most of the T6 and T7 vertebral bodies extending into the left pedicles and facets. There is a bulky soft tissue mass in the adjacent left paraspinal/subpleural soft tissues with destruction of the seventh rib. The bulk of the soft tissue component measures up to proximally 7.2 cm x 3.4 cm x 2.7 cm. There is extension into the T6-T7 and T7-T8 neural foramina as well as epidural tumor along the ventral and left dorsal lateral aspects of the spinal canal  resulting in moderate spinal canal stenosis with effacement of the thecal sac but no frank cord compression or cord edema. There is mild loss of vertebral body height at both levels consistent with associated pathologic fractures. There is compression deformity of the T11 vertebral body with up to approximately 30% loss of vertebral body height anteriorly and minimal bony retropulsion. There is faint edema along the superior endplate consistent with acute to subacute chronicity. This fracture may be pathologic. There are probable additional lesions involving multiple additional ribs (for example 25-21, 25-24). The above findings are new since the MRI from 2022. Cord:  There is no cord signal abnormality or abnormal enhancement. Paraspinal and other soft tissues: Unremarkable, aside from the bulky paraspinal tumor described above. Disc levels: There is a small disc protrusion at T10-T11 without significant spinal canal or neural foraminal stenosis. Otherwise, there is overall minimal background degenerative change without significant spinal canal or  neural foraminal stenosis. IMPRESSION: 1. Large soft tissue mass in the left paraspinal soft tissues at T6-T7 with destruction of the seventh rib and invasion of the T6 and T7 vertebral bodies with involvement of the left posterior elements as well as epidural tumor in the spinal canal resulting in moderate spinal canal stenosis without frank cord compression or cord edema. 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 2. Acute to subacute compression fracture of the T11 vertebral body with up to approximately 30% loss of vertebral body height and minimal bony retropulsion, possibly pathologic. 3. Small enhancing lesion in the right occipital calvarium, not definitely present on the prior cervical spine from 2022 (though possibly not included within the field of view on that study), suspicious for an additional metastatic or myeloma lesion. 4. Additional probable lesions involving multiple additional ribs. 5. No acute or suspicious finding in the cervical spine. Electronically Signed   By: Lesia Hausen M.D.   On: 01/24/2023 14:33   MR Cervical Spine W and Wo Contrast  Result Date: 01/24/2023 CLINICAL DATA:  Soft tissue mass with bony destruction involving the left seventh rib and T6 and T7 vertebral bodies. EXAM: MRI CERVICAL AND THORACIC SPINE WITHOUT AND WITH CONTRAST TECHNIQUE: Multiplanar and multiecho pulse sequences of the cervical spine, to include the craniocervical junction and cervicothoracic junction, and the thoracic spine, were obtained without and with intravenous contrast. CONTRAST:  7mL GADAVIST GADOBUTROL 1 MMOL/ML IV SOLN COMPARISON:  Same day CTA chest, cervical and thoracic spine MRI 07/20/2020 FINDINGS: MRI CERVICAL SPINE FINDINGS Alignment: There is trace retrolisthesis of C3 on C4 and trace anterolisthesis of C5 on C6. Vertebrae: Vertebral body heights are preserved. Background marrow signal is normal. A T1 hypointense/T2 hyperintense  lesion in the clivus is unchanged since 2022, favored benign. There is no suspicious marrow signal abnormality or marrow edema in the cervical spine. There is no abnormal marrow enhancement in the cervical spine. There is a 6 mm enhancing lesion in the right occipital calvarium which was not seen on the prior cervical spine MRI from 2022; however, this area may not has been included within the field of view. Cord: Normal in signal and morphology. Posterior Fossa, vertebral arteries, paraspinal tissues: The imaged posterior fossa is unremarkable. The vertebral artery flow voids are normal. The paraspinal soft tissues are unremarkable. Disc levels: There is overall mild degenerative change in the cervical spine without significant spinal canal or neural foraminal stenosis. MRI THORACIC SPINE FINDINGS Alignment:  Normal. Vertebrae: Background marrow signal is normal. There is infiltrative T1 hypointensity occupying most  of the T6 and T7 vertebral bodies extending into the left pedicles and facets. There is a bulky soft tissue mass in the adjacent left paraspinal/subpleural soft tissues with destruction of the seventh rib. The bulk of the soft tissue component measures up to proximally 7.2 cm x 3.4 cm x 2.7 cm. 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 There is mild loss of vertebral body height at both levels consistent with associated pathologic fractures. There is compression deformity of the T11 vertebral body with up to approximately 30% loss of vertebral body height anteriorly and minimal bony retropulsion. There is faint edema along the superior endplate consistent with acute to subacute chronicity. This fracture may be pathologic. There are probable additional lesions involving  multiple additional ribs (for example 25-21, 25-24). The above findings are new since the MRI from 2022. Cord:  There is no cord signal abnormality or abnormal enhancement. Paraspinal and other soft tissues: Unremarkable, aside from the bulky paraspinal tumor described above. Disc levels: There is a small disc protrusion at T10-T11 without significant spinal canal or neural foraminal stenosis. Otherwise, there is overall minimal background degenerative change without significant spinal canal or neural foraminal stenosis. IMPRESSION: 1. Large soft tissue mass in the left paraspinal soft tissues at T6-T7 with destruction of the seventh rib and invasion of the T6 and T7 vertebral bodies with involvement of the left posterior elements as well as epidural tumor in the spinal canal resulting in moderate spinal canal stenosis without frank cord compression or cord edema. 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 2. Acute to subacute compression fracture of the T11 vertebral body with up to approximately 30% loss of vertebral body height and minimal bony retropulsion, possibly pathologic. 3. Small enhancing lesion in the right occipital calvarium, not definitely present on the prior cervical spine from 2022 (though possibly not included within the field of view on that study), suspicious for an additional metastatic or myeloma lesion. 4. Additional probable lesions involving multiple additional ribs. 5. No acute or suspicious finding in the cervical spine. Electronically Signed   By: Lesia Hausen M.D.   On: 01/24/2023 14:33   CT Angio Chest PE W and/or Wo Contrast  Result Date: 01/24/2023 CLINICAL DATA:  Chest pain and shortness of breath for a month. EXAM: CT ANGIOGRAPHY CHEST WITH CONTRAST TECHNIQUE: Multidetector CT imaging of the chest was performed using the standard protocol during bolus administration of intravenous contrast. Multiplanar CT image  reconstructions and MIPs were obtained to evaluate the vascular anatomy. RADIATION DOSE REDUCTION: This exam was performed according to the departmental dose-optimization program which includes automated exposure control, adjustment of the mA and/or kV according to patient size and/or use of iterative reconstruction technique. CONTRAST:  75mL OMNIPAQUE IOHEXOL 350 MG/ML SOLN COMPARISON:  X-ray 01/24/2023 and older.  Previous CT January 2017 FINDINGS: Cardiovascular: The thoracic aorta has a normal course and caliber with minimal calcified plaque. There is a bovine type aortic arch, normal variant. Coronary artery calcifications are seen. Please correlate for other coronary risk factors. Heart is nonenlarged. There is slight wall thickening of the left ventricle. Prominent fat along the intra-atrial septum. Pulmonary arteries show some slight enlargement centrally. Please correlate for any evidence of pulmonary artery hypertension. There is heterogeneous enhancement  of the pulmonary arterial tree limiting evaluation for emboli. No obvious large and central embolus although several areas are nondiagnostic. Mediastinum/Nodes: Surgical changes identified with a moderate hiatal hernia. Small thyroid gland. No specific abnormal lymph node enlargement identified in the axillary region, hilum or mediastinum. There are some small nodes identified in the posterior mediastinum, posterior to the descending thoracic aorta such as series 4, image 86 but not pathologic by size criteria. Lungs/Pleura: There is some linear opacity lung bases likely scar or atelectasis. No consolidation. Breathing motion identified. No pleural effusion or pneumothorax. 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 Benign hepatic cysts as well. Musculoskeletal: There is large destructive mass involving the posterior aspect of the left seventh rib with extension to involve the central canal of the  spine in the associated vertebral bodies of T6 and T7. Cord compression is possible. This mass is estimated in diameter on series 4, image 65 at 8.5 by 4.3 cm. Slight extension along the extrapleural space in the posterior mediastinum as well. There is question of some subtle other lucent lesions identified along the spine but indeterminate. Critical Value/emergent results were called by telephone at the time of interpretation on 01/24/2023 at 8:41 am to provider [facility] , who verbally acknowledged these results. Review of the MIP images confirms the above findings. IMPRESSION: Aggressive soft tissue mass with bone destruction involving the left seventh rib as well as the T6 and T7 vertebral bodies and central canal. A neoplastic process is possible. There also is a possibility of significant cord compression with canal encroachment along the spine. Recommend further evaluation with spine MRI with and without contrast and a whole-body bone scan. There appears to be some other subtle lucent bone lesions along the spine and sternum. Please correlate for any history of known malignancy including such processes as myeloma or other. Poor opacification of the pulmonary arterial tree limiting evaluation. Several areas are non diagnostic for pulmonary emboli. No obvious large and central embolus. Aortic Atherosclerosis (ICD10-I70.0). Electronically Signed   By: Karen Kays M.D.   On: 01/24/2023 11:47   DG Chest 2 View  Result Date: 01/24/2023 CLINICAL DATA:  Chest pain EXAM: CHEST - 2 VIEW COMPARISON:  Chest x-ray dated January 07, 2023 FINDINGS: The heart size and mediastinal contours are within normal limits. Small hiatal hernia. Both lungs are clear. Pleural-based nodular opacity of the left posterior hemithorax. The visualized skeletal structures are unremarkable. IMPRESSION: Pleural-based nodular opacity of the left posterior hemithorax. Recommend further evaluation with contrast enhanced chest CT. Electronically  Signed   By: Allegra Lai M.D.   On: 01/24/2023 09:38     Assessment and plan- Patient is a 57 y.o. female with newly diagnosed IgG lambda multiple myeloma admitted with acute kidney injury  AKI- creatinine remains unchanged at 5. She is having a good urine output. Since 7 am today she has had 2L of urine output. She is getting fluids at 150 cc/hour. Clinically euvolemic. No signs of fluid overload. Would recommend aggressive IV hydration to maintain urine output of atleast 3L per day.   She received cycle 1 day 1 of cytoxan and velcade today. Velcade again to be given on Saturday. Hopefully kidneys should respond now that myeloma treatment has begun. Day 2/4 of pulse dose steroids. If [employer] respond, kidney biopsy may have to be considered down the line  I did discuss PET scan results with the patient which showed hypermetabolic L4 lesion as well. Residual  soft tissue density in T6-T7 region.   Neoplasm related pain- continue fentanyl 12 mcg and prn dilaudid. Palliative care on board and managing pain and constipation   Visit Diagnosis 1. Multiple myeloma not having achieved remission (HCC)   2. Palliative care encounter   3. Hyperuricemia   4. Constipation, unspecified constipation type      Dr. Owens Shark, MD, MPH [facility] at [facility] [phone] 02/12/2023 4:38 PM

## 2023-02-12 NOTE — TOC Initial Note (Signed)
Transition of Care [facility]) - Initial/Assessment Note    Patient Details  Name: Paula Garrett MRN: [id number] Date of Birth: 03-19-1966  [facility] CM/SW Contact:    Allena Katz, LCSW Phone Number: 02/12/2023, 3:19 PM  Clinical Narrative:    PT admitted from home with  AKI and multiple myeloma. Pt    was found to have a large soft tissue mass, pallative has been consulted to discuss goals of care. TOC following for care plan updates and needs.         Patient Goals and CMS Choice            Expected Discharge Plan and Services                                              Prior Living Arrangements/Services                       Activities of Daily Living Home Assistive Devices/Equipment: None ADL Screening (condition at time of admission) Patient's cognitive ability adequate to safely complete daily activities?: Yes Is the patient deaf or have difficulty hearing?: No Does the patient have difficulty seeing, even when wearing glasses/contacts?: No Does the patient have difficulty concentrating, remembering, or making decisions?: No Patient able to express need for assistance with ADLs?: Yes Does the patient have difficulty dressing or bathing?: No Independently performs ADLs?: Yes (appropriate for developmental age) Does the patient have difficulty walking or climbing stairs?: No Weakness of Legs: None Weakness of Arms/Hands: None  Permission Sought/Granted                  Emotional Assessment              Admission diagnosis:  AKI (acute kidney injury) (HCC) [N17.9] Patient Active Problem List   Diagnosis Date Noted   Confusion 02/03/2023   Acute hypoxic respiratory failure (HCC) 01/31/2023   Fluid overload 01/30/2023   Hyponatremia 01/29/2023   AKI (acute kidney injury) (HCC) 01/29/2023   Acute blood loss anemia 01/29/2023   Spinal instability of 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/28/2023   Drug induced constipation  01/27/2023   Palliative care encounter 01/27/2023   Multiple myeloma (HCC) 01/25/2023   Spinal stenosis, 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   MDD (major depressive disorder), recurrent episode, moderate (HCC) 11/20/2022   SOB (shortness of breath) 11/01/2022   Chest pain 10/07/2022   Anxiety 10/07/2022   Iron deficiency anemia 12/14/2021   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   Goals of care, counseling/discussion 07/24/2020   Hypercalcemia 05/05/2020   Traumatic seroma of lower leg, right, subsequent encounter 05/05/2020   Traumatic hematoma of lower leg, right, subsequent encounter 02/09/2020   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   Colon polyps 07/26/2019   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   Prediabetes 09/10/2018   OSA (obstructive sleep apnea) 01/09/2018   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   Allergic rhinitis 11/20/2017   Hot flashes 11/20/2017   Asthma 07/26/2017   B12 deficiency 07/26/2017   Snoring 05/01/2016   Rheumatoid factor positive 10/24/2015   Annual physical exam 01/12/2015   Chronic fatigue 06/23/2014   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   Barrett's esophagus without dysplasia 05/27/2014   History of adenomatous polyp of colon 05/27/2014   Anemia 01/28/2014   Morbid obesity (HCC) 01/28/2014   Low back pain  07/21/2013   Benign essential hypertension 01/20/2013   Vitamin D deficiency 12/02/2012   Chronic left shoulder pain 12/02/2012   Tobacco use disorder 06/09/2012   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   Insomnia 04/21/2012   Hypothyroidism 04/21/2012   Obesity (BMI 30-39.9) 04/21/2012   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   PCP:  Smitty Cords, DO Pharmacy:   [facility] [phone] - [place], [place] - [address] AT [facility] [address] [place] [address] [place] [address] Phone: [phone] Fax: [phone]     Social Determinants of Health (SDOH) Social History: SDOH Screenings   Food  Insecurity: No Food Insecurity (02/10/2023)  Housing: Low Risk  (02/10/2023)  Transportation Needs: No Transportation Needs (02/10/2023)  Utilities: Not At Risk (02/10/2023)  Alcohol Screen: Low Risk  (01/01/2023)  Depression (PHQ2-9): High Risk (01/01/2023)  Financial Resource Strain: Low Risk  (11/01/2022)  Physical Activity: Insufficiently Active (11/01/2022)  Social Connections: Socially Isolated (11/01/2022)  Stress: Stress Concern Present (11/01/2022)  Tobacco Use: High Risk (02/10/2023)   SDOH Interventions:     Readmission Risk Interventions    02/12/2023    3:19 PM  Readmission Risk Prevention Plan  Transportation Screening Complete  Medication Review (RN Care Manager) Complete  HRI or Home Care Consult Complete  SW Recovery Care/Counseling Consult Complete  Palliative Care Screening Complete  Skilled Nursing Facility Not Applicable

## 2023-02-12 NOTE — Progress Notes (Signed)
[facility]  ROUNDING NOTE   Subjective:   Patient seen and resting in bed Chemotherapy underway No complaints to offer  Creatinine remains 5.09 Sodium remains 126 Urine output in past 24 hours   Objective:  Vital signs in last 24 hours:  Temp:  [97.7 F (36.5 C)-98.8 F (37.1 C)] 97.9 F (36.6 C) (08/28 0720) Pulse Rate:  [73-87] 73 (08/28 0720) Resp:  [16-20] 16 (08/28 0720) BP: (117-133)/(59-76) 124/73 (08/28 0720) SpO2:  [93 %-95 %] 95 % (08/28 0720)  Weight change:  Filed Weights   02/10/23 1805  Weight: 81.6 kg    Intake/Output: I/O last 3 completed shifts: In: 1800.6 [P.O.:1411; I.V.:389.6] Out: 3000 [Urine:3000]   Intake/Output this shift:  No intake/output data recorded.  Physical Exam: General: NAD  Head: Normocephalic, atraumatic. Moist oral mucosal membranes  Eyes: Anicteric  Lungs:  Clear to auscultation, normal effort  Heart: Regular rate and rhythm  Abdomen:  Soft, nontender,   Extremities:  No peripheral edema.  Neurologic: Nonfocal, moving all four extremities  Skin: No lesions  Access: None    Basic Metabolic Panel: Recent Labs  Lab 02/10/23 0906 02/10/23 1022 02/10/23 1315 02/10/23 1500 02/10/23 2009 02/11/23 0457 02/12/23 0155  NA 120* 120*  --  122* 125* 126* 126*  K 5.0 5.7*  --   --  5.3* 5.4* 5.2*  CL 89* 88*  --   --  95* 98 98  CO2 19* 21*  --   --  19* 18* 17*  GLUCOSE 93 75  --   --  82 92 201*  BUN 54* 58*  --   --  59* 59* 62*  CREATININE 5.30* 5.27*  --   --  5.34* 5.30* 5.09*  CALCIUM 8.3* 8.4*  --   --  7.8* 7.7* 7.9*  PHOS  --   --  6.4*  --   --  6.8*  --     Liver Function Tests: Recent Labs  Lab 02/10/23 0906  AST 18  ALT 16  ALKPHOS 60  BILITOT 0.3  PROT 8.2*  ALBUMIN 2.1*   No results for input(s): "LIPASE", "AMYLASE" in the last 168 hours. No results for input(s): "AMMONIA" in the last 168 hours.  CBC: Recent Labs  Lab 02/10/23 0906  WBC 14.3*  NEUTROABS 10.4*  HGB  8.4*  HCT 25.2*  MCV 94.4  PLT 439*    Cardiac Enzymes: Recent Labs  Lab 02/10/23 1500  CKTOTAL 119    BNP: Invalid input(s): "POCBNP"  CBG: No results for input(s): "GLUCAP" in the last 168 hours.  Microbiology: Results for orders placed or performed during the hospital encounter of 02/10/23  Surgical PCR screen     Status: None   Collection Time: 02/10/23  7:09 PM   Specimen: Nasal Mucosa; Nasal Swab  Result Value Ref Range Status   MRSA, PCR NEGATIVE NEGATIVE Final   Staphylococcus aureus NEGATIVE NEGATIVE Final    Comment: (NOTE) The Xpert SA Assay (FDA approved for NASAL specimens in patients 85 years of age and older), is one component of a comprehensive surveillance program. It is not intended to diagnose infection nor to guide or monitor treatment. Performed at [facility], [address] [place], [place] [address]     Coagulation Studies: No results for input(s): "LABPROT", "INR" in the last 72 hours.  Urinalysis: Recent Labs    02/10/23 1500  COLORURINE STRAW*  LABSPEC 1.006  PHURINE 5.0  GLUCOSEU NEGATIVE  HGBUR NEGATIVE  BILIRUBINUR NEGATIVE  KETONESUR NEGATIVE  PROTEINUR NEGATIVE  NITRITE NEGATIVE  LEUKOCYTESUR NEGATIVE      Imaging: US RENAL  Result Date: 02/10/2023 CLINICAL DATA:  AKI EXAM: RENAL / URINARY TRACT ULTRASOUND COMPLETE COMPARISON:  None Available. FINDINGS: Right Kidney: Renal measurements: 12.5 cm x 5.6 cm x 4.6 cm = volume: 165 mL. Parenchymal echogenicity is normal. There is no hydronephrosis. Left Kidney: Renal measurements: 12.6 cm x 6.4 cm x 5.0 cm = volume: 213 mL. Parenchymal echogenicity is normal. There is no hydronephrosis. Bladder: Decompressed by a Foley catheter. Other: None. IMPRESSION: Unremarkable renal ultrasound. Electronically Signed   By: Lesia Hausen M.D.   On: 02/10/2023 17:03   DG Chest 1 View  Result Date: 02/10/2023 CLINICAL DATA:  Acute kidney injury. Sodium 120. Elevated creatinine.  EXAM: CHEST  1 VIEW COMPARISON:  02/01/2023 FINDINGS: Shallow inspiration. Cardiac enlargement. Hazy interstitial infiltrates throughout both lungs most likely representing edema although possibly interstitial pneumonia or chronic fibrosis. Small left pleural effusion with basilar atelectasis or consolidation. This could indicate pneumonia in the appropriate clinical setting. No pneumothorax. Postoperative changes in the thoracic spine. No significant change since prior study. IMPRESSION: Cardiac enlargement with probable interstitial pulmonary edema. Small left pleural effusion with basilar atelectasis or consolidation. Similar appearance to previous study. Electronically Signed   By: Burman Nieves M.D.   On: 02/10/2023 15:50     Medications:    sodium chloride     sodium chloride 150 mL/hr at 02/12/23 0434   famotidine (PEPCID) IV      allopurinol  100 mg Oral Daily   bisacodyl  10 mg Oral Daily   butalbital-acetaminophen-caffeine  2 tablet Oral Once   Chlorhexidine Gluconate Cloth  6 each Topical Daily   clonazePAM  0.5 mg Oral BID   cyclophosphamide  500 mg/m2 (Treatment Plan Recorded) Intravenous Once   dexamethasone  40 mg Oral Daily   docusate sodium  100 mg Oral BID   escitalopram  10 mg Oral Daily   fentaNYL  1 patch Transdermal Q72H   gabapentin  100 mg Oral BID   heparin  5,000 Units Subcutaneous Q12H   levothyroxine  250 mcg Oral QAC breakfast   loratadine  10 mg Oral Daily   mometasone-formoterol  2 puff Inhalation BID   mupirocin ointment  1 Application Nasal BID   pantoprazole  40 mg Oral Daily   polyethylene glycol  17 g Oral Daily   umeclidinium bromide  1 puff Inhalation Daily   sodium chloride, albuterol, albuterol, alteplase, cyclobenzaprine, diphenhydrAMINE, EPINEPHrine, famotidine (PEPCID) IV, heparin lock flush, heparin lock flush, HYDROmorphone, methylPREDNISolone sodium succinate, sodium chloride flush, sodium chloride flush, zolpidem  Assessment/ Plan:   Paula Garrett is a 57 y.o.  female with past medical history hypertension, depression, anxiety, tobacco, and recent diagnosis of multiple myeloma, who was admitted to [facility] on 02/10/2023 for AKI (acute kidney injury) (HCC) [N17.9]   Acute kidney injury appears to multifactorial. Admits to NSAID use and recent diagnosis of multiple myeloma. Differential diagnosis includes light chain nephropathy. Renal ultrasound negative for hydronephrosis.  Renal function remains stable. Good urine output noted. Will continue to monitor renal indices. If no improvement, will consider renal biopsy and/or renal replacement therapy.    Lab Results  Component Value Date   CREATININE 5.09 (H) 02/12/2023   CREATININE 5.30 (H) 02/11/2023   CREATININE 5.34 (H) 02/10/2023    Intake/Output Summary (Last 24 hours) at 02/12/2023 0944 Last data filed at 02/12/2023 0700 Gross per  24 hour  Intake 1800.58 ml  Output 1800 ml  Net 0.58 ml   2. Hyperkalemia, likely secondary to kidney injury. Potassium 5.2. Will continue to monitor.    3. Acute metabolic acidosis, S bicarb 19 on admission.  Initially corrected with sodium bicarb infusion.    4. Multiple myeloma diagnosed with bone marrow biopsy on 02/05/23. Following Dr Smith Robert outpatient. Chemotherapy initiated today    LOS: 2   8/28/20249:44 AM

## 2023-02-13 ENCOUNTER — Encounter: Payer: Self-pay | Admitting: Oncology

## 2023-02-13 ENCOUNTER — Ambulatory Visit: Payer: Managed Care, Other (non HMO)

## 2023-02-13 ENCOUNTER — Telehealth: Payer: Self-pay

## 2023-02-13 DIAGNOSIS — N179 Acute kidney failure, unspecified: Secondary | ICD-10-CM | POA: Diagnosis not present

## 2023-02-13 DIAGNOSIS — C9000 Multiple myeloma not having achieved remission: Secondary | ICD-10-CM | POA: Diagnosis not present

## 2023-02-13 DIAGNOSIS — Z515 Encounter for palliative care: Secondary | ICD-10-CM | POA: Diagnosis not present

## 2023-02-13 LAB — BASIC METABOLIC PANEL
Anion gap: 13 (ref 5–15)
BUN: 69 mg/dL — ABNORMAL HIGH (ref 6–20)
CO2: 17 mmol/L — ABNORMAL LOW (ref 22–32)
Calcium: 7.9 mg/dL — ABNORMAL LOW (ref 8.9–10.3)
Chloride: 99 mmol/L (ref 98–111)
Creatinine, Ser: 4.63 mg/dL — ABNORMAL HIGH (ref 0.44–1.00)
GFR, Estimated: 10 mL/min — ABNORMAL LOW (ref >60)
Glucose, Bld: 99 mg/dL (ref 70–99)
Potassium: 5.4 mmol/L — ABNORMAL HIGH (ref 3.5–5.1)
Sodium: 129 mmol/L — ABNORMAL LOW (ref 135–145)

## 2023-02-13 LAB — GLUCOSE 6 PHOSPHATE DEHYDROGENASE
G6PDH: 9.8 U/g{Hb} (ref 5.5–14.2)
Hemoglobin: 7.4 g/dL — ABNORMAL LOW (ref 11.1–15.9)

## 2023-02-13 LAB — CBC
HCT: 21 % — ABNORMAL LOW (ref 36.0–46.0)
Hemoglobin: 7 g/dL — ABNORMAL LOW (ref 12.0–15.0)
MCH: 31.4 pg (ref 26.0–34.0)
MCHC: 33.3 g/dL (ref 30.0–36.0)
MCV: 94.2 fL (ref 80.0–100.0)
Platelets: 424 10*3/uL — ABNORMAL HIGH (ref 150–400)
RBC: 2.23 MIL/uL — ABNORMAL LOW (ref 3.87–5.11)
RDW: 13.8 % (ref 11.5–15.5)
WBC: 12.2 10*3/uL — ABNORMAL HIGH (ref 4.0–10.5)
nRBC: 0 % (ref 0.0–0.2)

## 2023-02-13 LAB — URIC ACID: Uric Acid, Serum: 7.5 mg/dL — ABNORMAL HIGH (ref 2.5–7.1)

## 2023-02-13 LAB — PREPARE RBC (CROSSMATCH)

## 2023-02-13 LAB — MAGNESIUM: Magnesium: 2.1 mg/dL (ref 1.7–2.4)

## 2023-02-13 LAB — PHOSPHORUS: Phosphorus: 6 mg/dL — ABNORMAL HIGH (ref 2.5–4.6)

## 2023-02-13 MED ORDER — SODIUM BICARBONATE 650 MG PO TABS
650.0000 mg | ORAL_TABLET | Freq: Three times a day (TID) | ORAL | Status: DC
  Administered 2023-02-13 – 2023-02-21 (×25): 650 mg via ORAL
  Filled 2023-02-13 (×25): qty 1

## 2023-02-13 MED ORDER — SODIUM CHLORIDE 0.9% IV SOLUTION: Freq: Once | INTRAVENOUS | Status: AC

## 2023-02-13 NOTE — Progress Notes (Signed)
This Mirna Mires attempted to visit with the Pt after being referred to by the MD. Pt see on the bed heavily asleep. Mirna Mires will try to see her later.   02/13/23 1400  Spiritual Encounters  Type of Visit Initial  Care provided to: Pt not available  Conversation partners present during encounter Physician  Referral source Physician  Reason for visit Urgent spiritual support  OnCall Visit Yes

## 2023-02-13 NOTE — Progress Notes (Addendum)
PROGRESS NOTE    Paula Garrett  NGE:[id number] DOB: 10-Aug-1965 DOA: 02/10/2023 PCP: Smitty Cords, DO  105A/105A-AA  LOS: 3 days   Brief hospital course:   Assessment & Plan: Paula Garrett is a 57 y.o. female with multiple medical problems including hypertension, depression, anxiety, history of tobacco abuse, and multiple myeloma, who was hospitalized 01/24/2023 to 02/06/2023 with chest pain and shortness of breath and was found to have a large soft tissue mass in the left T6-T7 paraspinal area with destruction of seventh rib and invasion of T6 and T7 vertebral bodies.  Patient underwent surgery on 8/13 with postop period complicated by pain.  Pathology positive for multiple myeloma.  Patient now readmitted 02/10/2023 with AKI      AKI in the setting of multiple myeloma -- baseline creatinine .93 -- patient came in with creatinine of 5.27--- IV fluids-- 5.3 -- patient making good urine. -- Nephrology consultation with Dr. Wynelle Link.  -- Renal ultrasound no obstructive neuropathy Plan: --cont MIVF   Hyperkalemia -- no EKG changes. Patient received hyperkalemia cocktail in the ER. --lokelma 10g x2  Hyponatremia -- suspected due to renal failure   Multiple myeloma -- patient seen by Dr. Smith Robert -- given significant kidney issues from multiple myeloma patient to be started on inpatient chemotherapy  -- Follow further recommendations per Dr. Smith Robert --cont decadron 40 mg daily   Recent Mass of spinal cord [facility]) --Neurosurgical procedure done on 8/13 by Dr. Katrinka Blazing.   --Patient had a posterior segmental instrumentation T4-T9, posterior lateral arthrodesis from T4-T9, thoracic laminectomy at T6 and T7.   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  --pain management (switched from home oxycontin to Fentanyl patch, switched from home oxycodone PRN to oral dialudid PRN)   Chronic back pain --pain management (switched from home oxycontin to Fentanyl patch, switched from home  oxycodone PRN to oral dialudid PRN) --cont gabapentin   Asthma COPD -stable --cont daily bronchodilators   Anxiety depression -- continue Lexapro and Klonopin   Hypothyroidism -- continue Synthroid  Anemia --1u pRBC (irradiate) today  Mild encephalopathy, ruled out   DVT prophylaxis: Heparin SQ Code Status: Full code  Family Communication:  Level of care: Telemetry Medical Dispo:   The patient is from: home Anticipated d/c is to: home Anticipated d/c date is: > 3 days   Subjective and Interval History:  Pt became very emotional when asked how she was doing.  Reported being anxious.   Objective: Vitals:   02/13/23 0721 02/13/23 1054 02/13/23 1116 02/13/23 1532  BP: 114/66 128/64 113/61 130/71  Pulse: 79 90 80 72  Resp: 17 20 18 18   Temp: 98 F (36.7 C) 98.1 F (36.7 C) 98.1 F (36.7 C) 98.3 F (36.8 C)  TempSrc: Oral Oral Oral   SpO2: 94% 94%  94%  Weight:      Height:        Intake/Output Summary (Last 24 hours) at 02/13/2023 2005 Last data filed at 02/13/2023 1900 Gross per 24 hour  Intake 720 ml  Output 3100 ml  Net -2380 ml   Filed Weights   02/10/23 1805 02/12/23 1053  Weight: 81.6 kg 88.5 kg    Examination:   Constitutional: NAD, AAOx3 HEENT: conjunctivae and lids normal, EOMI CV: No cyanosis.   RESP: normal respiratory effort, on RA Neuro: II - XII grossly intact.   Psych: anxious mood and affect.     Data Reviewed: I have personally reviewed labs and imaging studies  Time spent: 82  minutes  Darlin Priestly, MD [facility] If 7PM-7AM, please contact night-coverage 02/13/2023, 8:05 PM

## 2023-02-13 NOTE — Plan of Care (Signed)
  Problem: Education: Goal: Ability to verbalize activity precautions or restrictions will improve Outcome: Progressing Goal: Knowledge of the prescribed therapeutic regimen will improve Outcome: Progressing Goal: Understanding of discharge needs will improve Outcome: Progressing   Problem: Activity: Goal: Ability to avoid complications of mobility impairment will improve Outcome: Progressing Goal: Ability to tolerate increased activity will improve Outcome: Progressing Goal: Will remain free from falls Outcome: Progressing   Problem: Bowel/Gastric: Goal: Gastrointestinal status for postoperative course will improve Outcome: Progressing   Problem: Clinical Measurements: Goal: Ability to maintain clinical measurements within normal limits will improve Outcome: Progressing Goal: Postoperative complications will be avoided or minimized Outcome: Progressing Goal: Diagnostic test results will improve Outcome: Progressing   Problem: Pain Management: Goal: Pain level will decrease Outcome: Progressing   Problem: Skin Integrity: Goal: Will show signs of wound healing Outcome: Progressing   Problem: Bladder/Genitourinary: Goal: Urinary functional status for postoperative course will improve Outcome: Progressing   Problem: Education: Goal: Knowledge of General Education information will improve Description: Including pain rating scale, medication(s)/side effects and non-pharmacologic comfort measures Outcome: Progressing   Problem: Nutrition: Goal: Adequate nutrition will be maintained Outcome: Progressing   Problem: Elimination: Goal: Will not experience complications related to bowel motility Outcome: Progressing Goal: Will not experience complications related to urinary retention Outcome: Progressing

## 2023-02-13 NOTE — Progress Notes (Signed)
[facility]  ROUNDING NOTE   Subjective:   Patient seen laying in bed Tearful, states she feel overwhelmed  Creatinine remains 4.63 (5.09) Sodium 129 Urine output in past 24 hours   Objective:  Vital signs in last 24 hours:  Temp:  [97.5 F (36.4 C)-98.3 F (36.8 C)] 98.1 F (36.7 C) (08/29 1054) Pulse Rate:  [76-90] 90 (08/29 1054) Resp:  [16-20] 20 (08/29 1054) BP: (114-140)/(64-74) 128/64 (08/29 1054) SpO2:  [94 %-100 %] 94 % (08/29 1054)  Weight change:  Filed Weights   02/10/23 1805 02/12/23 1053  Weight: 81.6 kg 88.5 kg    Intake/Output: I/O last 3 completed shifts: In: 0  Out: 3150 [Urine:3150]   Intake/Output this shift:  Total I/O In: 240 [P.O.:240] Out: -   Physical Exam: General: NAD, tearful  Head: Normocephalic, atraumatic. Moist oral mucosal membranes  Eyes: Anicteric  Lungs:  Clear to auscultation, normal effort  Heart: Regular rate and rhythm  Abdomen:  Soft, nontender  Extremities:  No peripheral edema.  Neurologic: Alert, moving all four extremities  Skin: No lesions  Access: None    Basic Metabolic Panel: Recent Labs  Lab 02/10/23 1022 02/10/23 1315 02/10/23 1500 02/10/23 2009 02/11/23 0457 02/12/23 0155 02/13/23 0514  NA 120*  --  122* 125* 126* 126* 129*  K 5.7*  --   --  5.3* 5.4* 5.2* 5.4*  CL 88*  --   --  95* 98 98 99  CO2 21*  --   --  19* 18* 17* 17*  GLUCOSE 75  --   --  82 92 201* 99  BUN 58*  --   --  59* 59* 62* 69*  CREATININE 5.27*  --   --  5.34* 5.30* 5.09* 4.63*  CALCIUM 8.4*  --   --  7.8* 7.7* 7.9* 7.9*  MG  --   --   --   --   --   --  2.1  PHOS  --  6.4*  --   --  6.8*  --  6.0*    Liver Function Tests: Recent Labs  Lab 02/10/23 0906  AST 18  ALT 16  ALKPHOS 60  BILITOT 0.3  PROT 8.2*  ALBUMIN 2.1*   No results for input(s): "LIPASE", "AMYLASE" in the last 168 hours. No results for input(s): "AMMONIA" in the last 168 hours.  CBC: Recent Labs  Lab 02/10/23 0906  02/10/23 2009 02/13/23 0514  WBC 14.3*  --  12.2*  NEUTROABS 10.4*  --   --   HGB 8.4* 7.4* 7.0*  HCT 25.2*  --  21.0*  MCV 94.4  --  94.2  PLT 439*  --  424*    Cardiac Enzymes: Recent Labs  Lab 02/10/23 1500  CKTOTAL 119    BNP: Invalid input(s): "POCBNP"  CBG: Recent Labs  Lab 02/12/23 1038  GLUCAP 100*    Microbiology: Results for orders placed or performed during the hospital encounter of 02/10/23  Surgical PCR screen     Status: None   Collection Time: 02/10/23  7:09 PM   Specimen: Nasal Mucosa; Nasal Swab  Result Value Ref Range Status   MRSA, PCR NEGATIVE NEGATIVE Final   Staphylococcus aureus NEGATIVE NEGATIVE Final    Comment: (NOTE) The Xpert SA Assay (FDA approved for NASAL specimens in patients 53 years of age and older), is one component of a comprehensive surveillance program. It is not intended to diagnose infection nor to guide or monitor treatment. Performed  at [facility], [address] [place], [place] [address]     Coagulation Studies: No results for input(s): "LABPROT", "INR" in the last 72 hours.  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 EXAM: NUCLEAR MEDICINE PET WHOLE BODY TECHNIQUE: 10.1 mCi F-18 FDG was injected intravenously. Full-ring PET imaging was performed from the head to foot after the radiotracer. CT data was obtained and used for attenuation correction and anatomic localization. Fasting blood glucose: 100 mg/dl COMPARISON:  CT chest 60/45/4098 FINDINGS: Mediastinal blood pool activity: SUV max HEAD/NECK: No  hypermetabolic activity in the scalp. No hypermetabolic cervical lymph nodes. Incidental CT findings: none CHEST: At the LEFT paraspinal resection site (T6-T7), there is residual fluid and soft tissue density tissue measuring 6.1 x 3.7 cm. This tissue has no significant metabolic activity. Activity is equal to background blood pool activity. Posterior fusion at this level. No hypermetabolic tissue within the thorax to suggest malignancy within the bones or soft tissue. There are several small nodules in the LEFT lung apex (image 54/6) without metabolic activity. These are new from comparison CT 01/24/2023 and therefore favored foci of infection or inflammation. Small bilateral pleural effusions are present greater on the LEFT. Incidental CT findings: none ABDOMEN/PELVIS: There is moderate metabolic activity through the GE junction. There is postsurgical anatomy of the proximal stomach. No abnormal metabolic activity liver. Scattered physiologic activity in the bowel. No hypermetabolic lymph nodes in the abdomen pelvis. Incidental CT findings: Foley catheter in bladder. SKELETON: Hypermetabolic lesion in the L4 vertebral body with SUV max equal 5.4. associated lytic lesion measuring 19 mm on image 121/series 6. EXTREMITIES: No abnormal hypermetabolic activity in the lower extremities. Incidental CT findings: No sclerotic or lytic lesions on CT imaging. IMPRESSION: 1. Solitary hypermetabolic lytic lesion in the L4 vertebral body consistent multiple myeloma. Metabolic activity is moderate. 2. Residual soft tissue density at the LEFT paraspinal resection site (T6-T7) with no significant metabolic activity. Favor benign postsurgical tissue. 3. Small bilateral pleural effusions greater on the LEFT. 4. Small nodules in the LEFT lung apex without metabolic activity are favored foci of infection or inflammation. 5. Moderate metabolic activity through the GE junction is favored physiologic or inflammatory. Electronically  Signed   By: Genevive Bi M.D.   On: 02/12/2023 13:47     Medications:    sodium chloride     sodium chloride 150 mL/hr at 02/13/23 1191   famotidine (PEPCID) IV      sodium chloride   Intravenous Once   allopurinol  100 mg Oral Daily   bisacodyl  10 mg Oral Daily   Chlorhexidine Gluconate Cloth  6 each Topical Daily   clonazePAM  0.5 mg Oral BID   dexamethasone  40 mg Oral Daily   docusate sodium  100 mg Oral BID   escitalopram  10 mg Oral Daily   fentaNYL  1 patch Transdermal Q72H   gabapentin  100 mg Oral BID   heparin  5,000 Units Subcutaneous Q12H   levothyroxine  250 mcg Oral QAC breakfast   loratadine  10 mg Oral Daily   mometasone-formoterol  2  puff Inhalation BID   mupirocin ointment  1 Application Nasal BID   pantoprazole  40 mg Oral Daily   polyethylene glycol  17 g Oral Daily   sodium bicarbonate  650 mg Oral TID   umeclidinium bromide  1 puff Inhalation Daily   sodium chloride, albuterol, albuterol, alteplase, cyclobenzaprine, diphenhydrAMINE, EPINEPHrine, famotidine (PEPCID) IV, heparin lock flush, heparin lock flush, HYDROmorphone, methylPREDNISolone sodium succinate, sodium chloride flush, sodium chloride flush, zolpidem  Assessment/ Plan:  Paula Garrett is a 57 y.o.  female with past medical history hypertension, depression, anxiety, tobacco, and recent diagnosis of multiple myeloma, who was admitted to [facility] on 02/10/2023 for AKI (acute kidney injury) (HCC) [N17.9]   Acute kidney injury appears to multifactorial. Admits to NSAID use and recent diagnosis of multiple myeloma. Differential diagnosis includes light chain nephropathy. Renal ultrasound negative for hydronephrosis.  Creatinine did improve overnight. Adequate urine output. No need for renal biopsy if creatinine continues to improve. Would like to continue IVF and aim for 3L urine output daily. No acute need for dialysis, will continue to monitor for now.    Lab Results  Component Value Date    CREATININE 4.63 (H) 02/13/2023   CREATININE 5.09 (H) 02/12/2023   CREATININE 5.30 (H) 02/11/2023    Intake/Output Summary (Last 24 hours) at 02/13/2023 1059 Last data filed at 02/13/2023 1021 Gross per 24 hour  Intake 240 ml  Output 1900 ml  Net -1660 ml   2. Hyperkalemia, likely secondary to kidney injury. Potassium 5.4. Oral Sodium bicarbonate 650mg  ordered.    3. Acute metabolic acidosis, S bicarb 19 on admission.  Slightly decreased to 17. Oral supplementation ordered as above.    4. Multiple myeloma diagnosed with bone marrow biopsy on 02/05/23. Following Dr Smith Robert outpatient. Chemotherapy began on 02/12/23    LOS: 3   8/29/202410:59 AM

## 2023-02-13 NOTE — Plan of Care (Signed)
  Problem: Activity: Goal: Ability to avoid complications of mobility impairment will improve Outcome: Progressing Goal: Ability to tolerate increased activity will improve Outcome: Progressing Goal: Will remain free from falls Outcome: Progressing   Problem: Bowel/Gastric: Goal: Gastrointestinal status for postoperative course will improve Outcome: Progressing   Problem: Pain Management: Goal: Pain level will decrease Outcome: Progressing   Problem: Skin Integrity: Goal: Will show signs of wound healing Outcome: Progressing   Problem: Nutrition: Goal: Adequate nutrition will be maintained Outcome: Progressing

## 2023-02-13 NOTE — Telephone Encounter (Signed)
Telephone call to patient for follow up after receiving first infusion.   No answer but left message stating we were calling to check on them.  Encouraged patient to call for any questions or concerns.   

## 2023-02-13 NOTE — Progress Notes (Signed)
Palliative Medicine [facility] at [facility] Telephone:([phone] [phone]   Name: Paula Garrett Date: 02/13/2023 MRN: [id number]  DOB: 09-01-1965  Patient Care Team: Smitty Cords, DO as PCP - General (Family Medicine) Lemar Livings, Merrily Pew, MD (General Surgery) Shelia Media, MD (Internal Medicine) Creig Hines, MD as Consulting Physician (Oncology)    REASON FOR CONSULTATION: Paula Garrett is a 57 y.o. female with multiple medical problems including hypertension, depression, anxiety, history of tobacco abuse, and multiple myeloma, who was hospitalized 01/24/2023 to 02/06/2023 with chest pain and shortness of breath and was found to have a large soft tissue mass in the left T6-T7 paraspinal area with destruction of seventh rib and invasion of T6 and T7 vertebral bodies.  Patient underwent surgery on 8/13 with postop period complicated by pain.  Pathology positive for multiple myeloma.  Patient now readmitted 02/10/2023 with AKI.  She was referred to palliative care to address goals and manage ongoing symptoms.   CODE STATUS: Full code  PAST MEDICAL HISTORY: Past Medical History:  Diagnosis Date   Anxiety    Arthritis    Asthma    Barrett's esophagus 2015   COVID-19    03/10/20   Depression    GERD (gastroesophageal reflux disease)    Hypertension    Hypothyroidism    Pneumonia    Smoldering myeloma    Thyroid disease     PAST SURGICAL HISTORY:  Past Surgical History:  Procedure Laterality Date   ABLATION  2006   APPLICATION OF INTRAOPERATIVE CT SCAN N/A 01/28/2023   Procedure: APPLICATION OF INTRAOPERATIVE CT SCAN;  Surgeon: Loreen Freud, MD;  Location: [facility] ORS;  Service: Neurosurgery;  Laterality: N/A;   BREAST CYST ASPIRATION Left 1998   BREAST SURGERY     cyst aspiration   BREAST SURGERY  1998   cyst aspiration   COLONOSCOPY  03/2014   COLONOSCOPY WITH PROPOFOL N/A 01/06/2020   Procedure: COLONOSCOPY  WITH PROPOFOL;  Surgeon: Toledo, Boykin Nearing, MD;  Location: [facility] ENDOSCOPY;  Service: Gastroenterology;  Laterality: N/A;   ENDOMETRIAL ABLATION     ESOPHAGOGASTRODUODENOSCOPY (EGD) WITH PROPOFOL N/A 08/15/2015   Procedure: ESOPHAGOGASTRODUODENOSCOPY (EGD) WITH PROPOFOL;  Surgeon: Christena Deem, MD;  Location: [facility] ENDOSCOPY;  Service: Endoscopy;  Laterality: N/A;   ESOPHAGOGASTRODUODENOSCOPY (EGD) WITH PROPOFOL N/A 01/22/2016   Procedure: ESOPHAGOGASTRODUODENOSCOPY (EGD) WITH PROPOFOL;  Surgeon: Christena Deem, MD;  Location: [facility] ENDOSCOPY;  Service: Endoscopy;  Laterality: N/A;   ESOPHAGOGASTRODUODENOSCOPY (EGD) WITH PROPOFOL N/A 01/06/2020   Procedure: ESOPHAGOGASTRODUODENOSCOPY (EGD) WITH PROPOFOL;  Surgeon: Toledo, Boykin Nearing, MD;  Location: [facility] ENDOSCOPY;  Service: Gastroenterology;  Laterality: N/A;   GASTRIC BYPASS  2005   HERNIA REPAIR     IR BONE MARROW BIOPSY & ASPIRATION  02/05/2023   NASAL SINUS SURGERY     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   TOTAL THYROIDECTOMY     TUBAL LIGATION      HEMATOLOGY/ONCOLOGY HISTORY:  Oncology History  Multiple myeloma (HCC)  01/25/2023 Initial Diagnosis   Multiple myeloma (HCC)   02/11/2023 -  Chemotherapy   Patient is on Treatment Plan : MYELOMA NEWLY DIAGNOSED TRANSPLANT CANDIDATE Cyclophosphamide IV + Bortezomib SQ + Dexamethasone (CyBorD) q21d x 4 cycles       ALLERGIES:  is allergic to hctz [hydrochlorothiazide].  MEDICATIONS:  Current Facility-Administered Medications  Medication Dose Route Frequency Provider Last Rate Last Admin   0.9 %  sodium chloride infusion  Intravenous Once PRN Creig Hines, MD       0.9 %  sodium chloride infusion   Intravenous Continuous Enedina Finner, MD 150 mL/hr at 02/13/23 0624 New Bag at 02/13/23 0624   albuterol (PROVENTIL) (2.5 MG/3ML) 0.083% nebulizer solution 2.5 mg  2.5 mg Nebulization Once PRN Creig Hines, MD       albuterol (PROVENTIL) (2.5 MG/3ML) 0.083% nebulizer solution 3 mL  3  mL Inhalation Q6H PRN Mikey College T, MD       allopurinol (ZYLOPRIM) tablet 100 mg  100 mg Oral Daily Creig Hines, MD   100 mg at 02/13/23 4696   alteplase (CATHFLO ACTIVASE) injection 2 mg  2 mg Intracatheter Once PRN Creig Hines, MD       bisacodyl (DULCOLAX) EC tablet 10 mg  10 mg Oral Daily Enedina Finner, MD   10 mg at 02/13/23 2952   Chlorhexidine Gluconate Cloth 2 % PADS 6 each  6 each Topical Daily Enedina Finner, MD   6 each at 02/13/23 1201   clonazePAM (KLONOPIN) tablet 0.5 mg  0.5 mg Oral BID Mikey College T, MD   0.5 mg at 02/13/23 8413   cyclobenzaprine (FLEXERIL) tablet 5-10 mg  5-10 mg Oral TID PRN Mikey College T, MD   10 mg at 02/13/23 2440   dexamethasone (DECADRON) tablet 40 mg  40 mg Oral Daily Creig Hines, MD   40 mg at 02/13/23 1027   diphenhydrAMINE (BENADRYL) injection 50 mg  50 mg Intravenous Once PRN Creig Hines, MD       docusate sodium (COLACE) capsule 100 mg  100 mg Oral BID Mikey College T, MD   100 mg at 02/13/23 2536   EPINEPHrine (EPI-PEN) injection 0.3 mg  0.3 mg Intramuscular Once PRN Creig Hines, MD       escitalopram (LEXAPRO) tablet 10 mg  10 mg Oral Daily Mikey College T, MD   10 mg at 02/13/23 0837   famotidine (PEPCID) IVPB 20 mg premix  20 mg Intravenous Once PRN Creig Hines, MD       fentaNYL (DURAGESIC) 12 MCG/HR 1 patch  1 patch Transdermal Q72H Enedina Finner, MD   1 patch at 02/11/23 1004   gabapentin (NEURONTIN) capsule 100 mg  100 mg Oral BID Mikey College T, MD   100 mg at 02/13/23 6440   heparin injection 5,000 Units  5,000 Units Subcutaneous Q12H Mikey College T, MD   5,000 Units at 02/13/23 3474   heparin lock flush 100 unit/mL  500 Units Intracatheter Once PRN Creig Hines, MD       heparin lock flush 100 unit/mL  250 Units Intracatheter Once PRN Creig Hines, MD       HYDROmorphone (DILAUDID) tablet 1-2 mg  1-2 mg Oral Q4H PRN Jacqueleen Pulver, Daryl Eastern, NP   2 mg at 02/13/23 1319   levothyroxine (SYNTHROID) tablet 250 mcg  250 mcg Oral QAC breakfast  Mikey College T, MD   250 mcg at 02/13/23 2595   loratadine (CLARITIN) tablet 10 mg  10 mg Oral Daily Mikey College T, MD   10 mg at 02/13/23 6387   methylPREDNISolone sodium succinate (SOLU-MEDROL) 125 mg/2 mL injection 125 mg  125 mg Intravenous Once PRN Creig Hines, MD       mometasone-formoterol [facility]) 200-5 MCG/ACT inhaler 2 puff  2 puff Inhalation BID Emeline General, MD   2 puff at 02/13/23 0837   mupirocin ointment (BACTROBAN) 2 %  1 Application  1 Application Nasal BID Emeline General, MD   1 Application at 02/13/23 0837   pantoprazole (PROTONIX) EC tablet 40 mg  40 mg Oral Daily Mikey College T, MD   40 mg at 02/13/23 1610   polyethylene glycol (MIRALAX / GLYCOLAX) packet 17 g  17 g Oral Daily Jyquan Kenley, Daryl Eastern, NP   17 g at 02/13/23 9604   sodium bicarbonate tablet 650 mg  650 mg Oral TID Lamont Dowdy, MD   650 mg at 02/13/23 0832   sodium chloride flush (NS) 0.9 % injection 10 mL  10 mL Intracatheter PRN Creig Hines, MD       sodium chloride flush (NS) 0.9 % injection 3 mL  3 mL Intracatheter PRN Creig Hines, MD       umeclidinium bromide (INCRUSE ELLIPTA) 62.5 MCG/ACT 1 puff  1 puff Inhalation Daily Mikey College T, MD   1 puff at 02/13/23 0833   zolpidem (AMBIEN) tablet 5-10 mg  5-10 mg Oral QHS PRN Emeline General, MD        VITAL SIGNS: BP 113/61   Pulse 80   Temp 98.1 F (36.7 C) (Oral)   Resp 18   Ht 5\' 2"  (1.575 m)   Wt 195 lb (88.5 kg)   SpO2 94%   BMI 35.67 kg/m  Filed Weights   02/10/23 1805 02/12/23 1053  Weight: 180 lb (81.6 kg) 195 lb (88.5 kg)    Estimated body mass index is 35.67 kg/m as calculated from the following:   Height as of this encounter: 5\' 2"  (1.575 m).   Weight as of this encounter: 195 lb (88.5 kg).  LABS: CBC:    Component Value Date/Time   WBC 12.2 (H) 02/13/2023 0514   HGB 7.0 (L) 02/13/2023 0514   HGB 7.4 (L) 02/10/2023 2009   HCT 21.0 (L) 02/13/2023 0514   HCT 38.5 07/02/2011 1529   PLT 424 (H) 02/13/2023 0514   PLT 311  07/02/2011 1529   MCV 94.2 02/13/2023 0514   MCV 90 07/02/2011 1529   NEUTROABS 10.4 (H) 02/10/2023 0906   LYMPHSABS 2.7 02/10/2023 0906   MONOABS 1.0 02/10/2023 0906   EOSABS 0.1 02/10/2023 0906   BASOSABS 0.0 02/10/2023 0906   Comprehensive Metabolic Panel:    Component Value Date/Time   NA 129 (L) 02/13/2023 0514   K 5.4 (H) 02/13/2023 0514   CL 99 02/13/2023 0514   CO2 17 (L) 02/13/2023 0514   BUN 69 (H) 02/13/2023 0514   CREATININE 4.63 (H) 02/13/2023 0514   GLUCOSE 99 02/13/2023 0514   CALCIUM 7.9 (L) 02/13/2023 0514   AST 18 02/10/2023 0906   ALT 16 02/10/2023 0906   ALKPHOS 60 02/10/2023 0906   BILITOT 0.3 02/10/2023 0906   PROT 8.2 (H) 02/10/2023 0906   ALBUMIN 2.1 (L) 02/10/2023 0906    RADIOGRAPHIC STUDIES: NM PET Image Initial (PI) Whole Body (F-18 FDG)  Result Date: 02/12/2023 CLINICAL DATA:  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 EXAM: NUCLEAR MEDICINE PET WHOLE BODY TECHNIQUE: 10.1 mCi F-18 FDG was injected intravenously. Full-ring PET imaging was performed from the head to foot after the radiotracer. CT data was obtained and used for attenuation correction and anatomic localization. Fasting blood glucose: 100 mg/dl COMPARISON:  CT chest 54/02/8118 FINDINGS: Mediastinal blood pool activity: SUV max HEAD/NECK: No hypermetabolic activity in the scalp. No hypermetabolic cervical lymph nodes. Incidental CT findings: none CHEST: At the  LEFT paraspinal resection site (T6-T7), there is residual fluid and soft tissue density tissue measuring 6.1 x 3.7 cm. This tissue has no significant metabolic activity. Activity is equal to background blood pool activity. Posterior fusion at this level. No hypermetabolic tissue within the thorax to suggest malignancy within the bones or soft tissue. There are several small nodules in the LEFT lung apex (image 54/6) without metabolic activity. These are new from comparison CT  01/24/2023 and therefore favored foci of infection or inflammation. Small bilateral pleural effusions are present greater on the LEFT. Incidental CT findings: none ABDOMEN/PELVIS: There is moderate metabolic activity through the GE junction. There is postsurgical anatomy of the proximal stomach. No abnormal metabolic activity liver. Scattered physiologic activity in the bowel. No hypermetabolic lymph nodes in the abdomen pelvis. Incidental CT findings: Foley catheter in bladder. SKELETON: Hypermetabolic lesion in the L4 vertebral body with SUV max equal 5.4. associated lytic lesion measuring 19 mm on image 121/series 6. EXTREMITIES: No abnormal hypermetabolic activity in the lower extremities. Incidental CT findings: No sclerotic or lytic lesions on CT imaging. IMPRESSION: 1. Solitary hypermetabolic lytic lesion in the L4 vertebral body consistent multiple myeloma. Metabolic activity is moderate. 2. Residual soft tissue density at the LEFT paraspinal resection site (T6-T7) with no significant metabolic activity. Favor benign postsurgical tissue. 3. Small bilateral pleural effusions greater on the LEFT. 4. Small nodules in the LEFT lung apex without metabolic activity are favored foci of infection or inflammation. 5. Moderate metabolic activity through the GE junction is favored physiologic or inflammatory. Electronically Signed   By: Genevive Bi M.D.   On: 02/12/2023 13:47   US RENAL  Result Date: 02/10/2023 CLINICAL DATA:  AKI EXAM: RENAL / URINARY TRACT ULTRASOUND COMPLETE COMPARISON:  None Available. FINDINGS: Right Kidney: Renal measurements: 12.5 cm x 5.6 cm x 4.6 cm = volume: 165 mL. Parenchymal echogenicity is normal. There is no hydronephrosis. Left Kidney: Renal measurements: 12.6 cm x 6.4 cm x 5.0 cm = volume: 213 mL. Parenchymal echogenicity is normal. There is no hydronephrosis. Bladder: Decompressed by a Foley catheter. Other: None. IMPRESSION: Unremarkable renal ultrasound. Electronically  Signed   By: Lesia Hausen M.D.   On: 02/10/2023 17:03   DG Chest 1 View  Result Date: 02/10/2023 CLINICAL DATA:  Acute kidney injury. Sodium 120. Elevated creatinine. EXAM: CHEST  1 VIEW COMPARISON:  02/01/2023 FINDINGS: Shallow inspiration. Cardiac enlargement. Hazy interstitial infiltrates throughout both lungs most likely representing edema although possibly interstitial pneumonia or chronic fibrosis. Small left pleural effusion with basilar atelectasis or consolidation. This could indicate pneumonia in the appropriate clinical setting. No pneumothorax. Postoperative changes in the thoracic spine. No significant change since prior study. IMPRESSION: Cardiac enlargement with probable interstitial pulmonary edema. Small left pleural effusion with basilar atelectasis or consolidation. Similar appearance to previous study. Electronically Signed   By: Burman Nieves M.D.   On: 02/10/2023 15:50   IR BONE MARROW BIOPSY & ASPIRATION  Result Date: 02/05/2023 INDICATION: Hematologic abnormality EXAM: Bone marrow aspiration and core biopsy using fluoroscopic guidance MEDICATIONS: None. ANESTHESIA/SEDATION: Moderate (conscious) sedation was employed during this procedure. A total of Versed 1.5 mg and Fentanyl 75 mcg was administered intravenously. Moderate Sedation Time: 10 minutes. The patient's level of consciousness and vital signs were monitored continuously by radiology nursing throughout the procedure under my direct supervision. FLUOROSCOPY TIME:  Fluoroscopy Time: 0.7 minutes (13 mGy) COMPLICATIONS: None immediate. PROCEDURE: Informed written consent was obtained from the patient after a thorough discussion of the procedural risks,  benefits and alternatives. All questions were addressed. Maximal Sterile Barrier Technique was utilized including caps, mask, sterile gowns, sterile gloves, sterile drape, hand hygiene and skin antiseptic. A timeout was performed prior to the initiation of the procedure. The  patient was placed prone on the exam table. Limited fluoroscopy of the pelvis was performed for planning purposes. Skin entry site was marked, and the overlying skin was prepped and draped in the standard sterile fashion. Local analgesia was obtained with 1% lidocaine. Using fluoroscopic guidance, an 11 gauge needle was advanced just deep to the cortex of the right posterior ilium. Subsequently, bone marrow aspiration and core biopsy were performed. Specimens were submitted to lab/pathology for handling. Hemostasis was achieved with manual pressure, and a clean dressing was placed. The patient tolerated the procedure well without immediate complication. IMPRESSION: Successful bone marrow aspiration and core biopsy of the right posterior ilium. Electronically Signed   By: Olive Bass M.D.   On: 02/05/2023 15:35   CT HEAD WO CONTRAST ( )  Result Date: 02/03/2023 CLINICAL DATA:  Mental status change, unknown cause. Acute hypoxic respiratory failure EXAM: CT HEAD WITHOUT CONTRAST TECHNIQUE: Contiguous axial images were obtained from the base of the skull through the vertex without intravenous contrast. RADIATION DOSE REDUCTION: This exam was performed according to the departmental dose-optimization program which includes automated exposure control, adjustment of the mA and/or kV according to patient size and/or use of iterative reconstruction technique. COMPARISON:  Bone survey 10/09/2022. FINDINGS: Brain: No acute intracranial abnormality. Specifically, no hemorrhage, hydrocephalus, mass lesion, acute infarction, or significant intracranial injury. Vascular: No hyperdense vessel or unexpected calcification. Skull: Focal lucent lesions throughout the skull. 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 These lytic lesions were not in the skull on the skull x-ray from 10/09/2022. Appearance is concerning for possible multiple myeloma. No fracture. Sinuses/Orbits: No acute findings Other:  None IMPRESSION: No acute intracranial abnormality. Numerous scattered focal lucent lesions throughout the skull. These are new since prior bone survey from 10/09/2022 and concerning for multiple myeloma. Electronically Signed   By: Charlett Nose M.D.   On: 02/03/2023 16:30   DG Chest Port 1 View  Result Date: 02/01/2023 CLINICAL DATA:  Hypoxia EXAM: PORTABLE CHEST 1 VIEW COMPARISON:  01/30/2023 FINDINGS: Bilateral diffuse interstitial thickening and patchy alveolar airspace opacities at the right lung base and left mid lower lung. No pleural effusion or pneumothorax. Stable cardiomediastinal silhouette. No acute osseous abnormality. Posterior spinal fusion of the upper and midthoracic spine. IMPRESSION: 1. Bilateral diffuse interstitial thickening and patchy alveolar airspace opacities at the right lung base and left mid lower lung concerning for pulmonary edema. Electronically Signed   By: Elige Ko M.D.   On: 02/01/2023 10:13   ECHOCARDIOGRAM COMPLETE  Result Date: 01/31/2023    ECHOCARDIOGRAM REPORT   Patient Name:   MURNA PEACE Date of Exam: 01/31/2023 Medical Rec #:  [id number]      Height:       62.0 in Accession #:    [id number]     Weight:       170.0 lb Date of Birth:  1965/12/17      BSA:          1.784 m Patient Age:    57 years       BP:           106/57 mmHg Patient Gender: F              HR:  110 bpm. Exam Location:  [facility] Procedure: 2D Echo, Cardiac Doppler and Color Doppler Indications:     Dyspnea  History:         Patient has no prior history of Echocardiogram examinations.                  Signs/Symptoms:Dyspnea, Chest Pain, Fatigue and Shortness of                  Breath; Risk Factors:Hypertension, Sleep Apnea and Current                  Smoker.  Sonographer:     Mikki Harbor Referring Phys:  [id number] Alford Highland Diagnosing Phys: Julien Nordmann MD  Sonographer Comments: Image acquisition challenging due to respiratory motion. IMPRESSIONS  1. Left ventricular ejection  fraction, by estimation, is 60 to 65%. The left ventricle has normal function. The left ventricle has no regional wall motion abnormalities. There is mild left ventricular hypertrophy. Left ventricular diastolic parameters are consistent with Grade I diastolic dysfunction (impaired relaxation).  2. Right ventricular systolic function is normal. The right ventricular size is normal. There is normal pulmonary artery systolic pressure. The estimated right ventricular systolic pressure is 30.9 mmHg.  3. Left atrial size was mildly dilated.  4. The mitral valve is normal in structure. No evidence of mitral valve regurgitation. No evidence of mitral stenosis.  5. The aortic valve is tricuspid. Aortic valve regurgitation is not visualized. No aortic stenosis is present.  6. The inferior vena cava is normal in size with greater than 50% respiratory variability, suggesting right atrial pressure of 3 mmHg. FINDINGS  Left Ventricle: Left ventricular ejection fraction, by estimation, is 60 to 65%. The left ventricle has normal function. The left ventricle has no regional wall motion abnormalities. The left ventricular internal cavity size was normal in size. There is  mild left ventricular hypertrophy. Left ventricular diastolic parameters are consistent with Grade I diastolic dysfunction (impaired relaxation). Right Ventricle: The right ventricular size is normal. No increase in right ventricular wall thickness. Right ventricular systolic function is normal. There is normal pulmonary artery systolic pressure. The tricuspid regurgitant velocity is 2.64 m/s, and  with an assumed right atrial pressure of 3 mmHg, the estimated right ventricular systolic pressure is 30.9 mmHg. Left Atrium: Left atrial size was mildly dilated. Right Atrium: Right atrial size was normal in size. Pericardium: There is no evidence of pericardial effusion. Mitral Valve: The mitral valve is normal in structure. No evidence of mitral valve regurgitation.  No evidence of mitral valve stenosis. MV peak gradient, 8.0 mmHg. The mean mitral valve gradient is 4.0 mmHg. Tricuspid Valve: The tricuspid valve is normal in structure. Tricuspid valve regurgitation is mild . No evidence of tricuspid stenosis. Aortic Valve: The aortic valve is tricuspid. Aortic valve regurgitation is not visualized. No aortic stenosis is present. Aortic valve mean gradient measures 9.0 mmHg. Aortic valve peak gradient measures 18.4 mmHg. Aortic valve area, by VTI measures 3.04  cm. Pulmonic Valve: The pulmonic valve was normal in structure. Pulmonic valve regurgitation is not visualized. No evidence of pulmonic stenosis. Aorta: The aortic root is normal in size and structure. Venous: The inferior vena cava is normal in size with greater than 50% respiratory variability, suggesting right atrial pressure of 3 mmHg. IAS/Shunts: No atrial level shunt detected by color flow Doppler.  LEFT VENTRICLE PLAX 2D LVIDd:         5.80 cm   Diastology LVIDs:  3.60 cm   LV e' medial:    9.36 cm/s LV PW:         0.90 cm   LV E/e' medial:  9.7 LV IVS:        1.00 cm   LV e' lateral:   10.00 cm/s LVOT diam:     2.20 cm   LV E/e' lateral: 9.1 LV SV:         99 LV SV Index:   55 LVOT Area:     3.80 cm  RIGHT VENTRICLE RV Basal diam:  3.45 cm RV Mid diam:    3.00 cm RV S prime:     27.40 cm/s TAPSE (M-mode): 3.3 cm LEFT ATRIUM             Index        RIGHT ATRIUM           Index LA diam:        4.80 cm 2.69 cm/m   RA Area:     18.90 cm LA Vol (A2C):   59.6 ml 33.41 ml/m  RA Volume:   54.10 ml  30.32 ml/m LA Vol (A4C):   66.9 ml 37.50 ml/m LA Biplane Vol: 65.0 ml 36.43 ml/m  AORTIC VALVE AV Area (Vmax):    3.09 cm AV Area (Vmean):   3.04 cm AV Area (VTI):     3.04 cm AV Vmax:           214.50 cm/s AV Vmean:          136.750 cm/s AV VTI:            0.326 m AV Peak Grad:      18.4 mmHg AV Mean Grad:      9.0 mmHg LVOT Vmax:         174.50 cm/s LVOT Vmean:        109.500 cm/s LVOT VTI:          0.260 m  LVOT/AV VTI ratio: 0.80  AORTA Ao Root diam: 3.20 cm MITRAL VALVE                TRICUSPID VALVE MV Area (PHT): 3.72 cm     TR Peak grad:   27.9 mmHg MV Area VTI:   2.90 cm     TR Vmax:        264.00 cm/s MV Peak grad:  8.0 mmHg MV Mean grad:  4.0 mmHg     SHUNTS MV Vmax:       1.41 m/s     Systemic VTI:  0.26 m MV Vmean:      91.6 cm/s    Systemic Diam: 2.20 cm MV Decel Time: 204 msec MV E velocity: 90.70 cm/s MV A velocity: 117.00 cm/s MV E/A ratio:  0.78 Julien Nordmann MD Electronically signed by Julien Nordmann MD Signature Date/Time: 01/31/2023/5:43:31 PM    Final    DG Thoracic Spine 2 View  Result Date: 01/31/2023 CLINICAL DATA:  Back pain. 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 EXAM: THORACIC SPINE 2 VIEWS COMPARISON:  MRI of the cervicothoracic spine 01/24/2023. Intraoperative imaging of the thoracic spine 01/28/2023. Chest CTA 01/24/2023. 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 Counting superiorly from the T12 level, the patient has undergone midthoracic spinal decompression with posterior pedicle screw and rod fusion from T4 through T9. The hardware appears intact and well positioned. Chronic superior endplate compression deformity at T11 is unchanged.  No acute osseous complications are identified. There is mild atelectasis at both lung bases. IMPRESSION: No demonstrated acute findings status post recent midthoracic spinal decompression and fusion. Electronically Signed   By: Carey Bullocks M.D.   On: 01/31/2023 16:27   DG Chest Port 1 View  Result Date: 01/30/2023 CLINICAL DATA:  Cough, shortness of breath EXAM: PORTABLE CHEST 1 VIEW COMPARISON:  01/24/2023 FINDINGS: Cardiomegaly. Diffuse bilateral interstitial pulmonary opacity and trace pleural effusions. Interval thoracic fusion. IMPRESSION: Cardiomegaly with diffuse bilateral interstitial pulmonary opacity and trace pleural effusions, most consistent with  edema. Electronically Signed   By: Jearld Lesch M.D.   On: 01/30/2023 14:07   DG Thoracic Spine 2 View  Result Date: 01/28/2023 CLINICAL DATA:  Elective surgery. T4 through T9 fusion of biopsy for lesion with intra 3D imaging. EXAM: THORACIC SPINE 2 VIEWS COMPARISON:  Preoperative imaging. FINDINGS: Intraoperative 3D imaging of the thoracic spine obtained. Posterior rod with pedicle screws at multiple levels. Known paravertebral mass better assessed on preoperative imaging. No fluoroscopic spot views provided. Fluoroscopy time 1 minutes 30 seconds. Dose is 97.045 mGy IMPRESSION: Intraoperative 3D imaging of the thoracic spine. Electronically Signed   By: Narda Rutherford M.D.   On: 01/28/2023 16:28   DG C-Arm 1-60 Min-No Report  Result Date: 01/28/2023 Fluoroscopy was utilized by the requesting physician.  No radiographic interpretation.   DG C-Arm 1-60 Min-No Report  Result Date: 01/28/2023 Fluoroscopy was utilized by the requesting physician.  No radiographic interpretation.   DG C-Arm 1-60 Min-No Report  Result Date: 01/28/2023 Fluoroscopy was utilized by the requesting physician.  No radiographic interpretation.   DG C-Arm 1-60 Min-No Report  Result Date: 01/28/2023 Fluoroscopy was utilized by the requesting physician.  No radiographic interpretation.   MR THORACIC SPINE W WO CONTRAST  Result Date: 01/24/2023 CLINICAL DATA:  Soft tissue mass with bony destruction involving the left seventh rib and T6 and T7 vertebral bodies. EXAM: MRI CERVICAL AND THORACIC SPINE WITHOUT AND WITH CONTRAST TECHNIQUE: Multiplanar and multiecho pulse sequences of the cervical spine, to include the craniocervical junction and cervicothoracic junction, and the thoracic spine, were obtained without and with intravenous contrast. CONTRAST:  7mL GADAVIST GADOBUTROL 1 MMOL/ML IV SOLN COMPARISON:  Same day CTA chest, cervical and thoracic spine MRI 07/20/2020 FINDINGS: MRI CERVICAL SPINE FINDINGS Alignment: There is  trace retrolisthesis of C3 on C4 and trace anterolisthesis of C5 on C6. Vertebrae: Vertebral body heights are preserved. Background marrow signal is normal. A T1 hypointense/T2 hyperintense lesion in the clivus is unchanged since 2022, favored benign. There is no suspicious marrow signal abnormality or marrow edema in the cervical spine. There is no abnormal marrow enhancement in the cervical spine. There is a 6 mm enhancing lesion in the right occipital calvarium which was not seen on the prior cervical spine MRI from 2022; however, this area may not has been included within the field of view. Cord: Normal in signal and morphology. Posterior Fossa, vertebral arteries, paraspinal tissues: The imaged posterior fossa is unremarkable. The vertebral artery flow voids are normal. The paraspinal soft tissues are unremarkable. Disc levels: There is overall mild degenerative change in the cervical spine without significant spinal canal or neural foraminal stenosis. MRI THORACIC SPINE FINDINGS Alignment:  Normal. Vertebrae: Background marrow signal is normal. There is infiltrative T1 hypointensity occupying most of the T6 and T7 vertebral bodies extending into the left pedicles and facets. There is a bulky soft tissue mass in the adjacent left paraspinal/subpleural soft tissues with destruction  of the seventh rib. The bulk of the soft tissue component measures up to proximally 7.2 cm x 3.4 cm x 2.7 cm. 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 There is mild loss of vertebral body height at both levels consistent with associated pathologic fractures. There is compression deformity of the T11 vertebral body with up to approximately 30% loss of vertebral body height anteriorly and minimal bony  retropulsion. There is faint edema along the superior endplate consistent with acute to subacute chronicity. This fracture may be pathologic. There are probable additional lesions involving multiple additional ribs (for example 25-21, 25-24). The above findings are new since the MRI from 2022. Cord:  There is no cord signal abnormality or abnormal enhancement. Paraspinal and other soft tissues: Unremarkable, aside from the bulky paraspinal tumor described above. Disc levels: There is a small disc protrusion at T10-T11 without significant spinal canal or neural foraminal stenosis. Otherwise, there is overall minimal background degenerative change without significant spinal canal or neural foraminal stenosis. IMPRESSION: 1. Large soft tissue mass in the left paraspinal soft tissues at T6-T7 with destruction of the seventh rib and invasion of the T6 and T7 vertebral bodies with involvement of the left posterior elements as well as epidural tumor in the spinal canal resulting in moderate spinal canal stenosis without frank cord compression or cord edema. 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 2. Acute to subacute compression fracture of the T11 vertebral body with up to approximately 30% loss of vertebral body height and minimal bony retropulsion, possibly pathologic. 3. Small enhancing lesion in the right occipital calvarium, not definitely present on the prior cervical spine from 2022 (though possibly not included within the field of view on that study), suspicious for an additional metastatic or myeloma lesion. 4. Additional probable lesions involving multiple additional ribs. 5. No acute or suspicious finding in the cervical spine. Electronically Signed   By: Lesia Hausen M.D.   On: 01/24/2023 14:33   MR Cervical Spine W and Wo Contrast  Result Date: 01/24/2023 CLINICAL DATA:  Soft tissue mass with bony destruction involving the left seventh rib and T6 and  T7 vertebral bodies. EXAM: MRI CERVICAL AND THORACIC SPINE WITHOUT AND WITH CONTRAST TECHNIQUE: Multiplanar and multiecho pulse sequences of the cervical spine, to include the craniocervical junction and cervicothoracic junction, and the thoracic spine, were obtained without and with intravenous contrast. CONTRAST:  7mL GADAVIST GADOBUTROL 1 MMOL/ML IV SOLN COMPARISON:  Same day CTA chest, cervical and thoracic spine MRI 07/20/2020 FINDINGS: MRI CERVICAL SPINE FINDINGS Alignment: There is trace retrolisthesis of C3 on C4 and trace anterolisthesis of C5 on C6. Vertebrae: Vertebral body heights are preserved. Background marrow signal is normal. A T1 hypointense/T2 hyperintense lesion in the clivus is unchanged since 2022, favored benign. There is no suspicious marrow signal abnormality or marrow edema in the cervical spine. There is no abnormal marrow enhancement in the cervical spine. There is a 6 mm enhancing lesion in the right occipital calvarium which was not seen on the prior cervical spine MRI from 2022; however, this area may not has been included within the field of view. Cord: Normal in signal and morphology. Posterior Fossa, vertebral arteries, paraspinal tissues: The imaged posterior fossa is unremarkable. The vertebral artery  flow voids are normal. The paraspinal soft tissues are unremarkable. Disc levels: There is overall mild degenerative change in the cervical spine without significant spinal canal or neural foraminal stenosis. MRI THORACIC SPINE FINDINGS Alignment:  Normal. Vertebrae: Background marrow signal is normal. There is infiltrative T1 hypointensity occupying most of the T6 and T7 vertebral bodies extending into the left pedicles and facets. There is a bulky soft tissue mass in the adjacent left paraspinal/subpleural soft tissues with destruction of the seventh rib. The bulk of the soft tissue component measures up to proximally 7.2 cm x 3.4 cm x 2.7 cm. There is extension into the T6-T7 and  T7-T8 neural foramina as well as epidural tumor along the ventral and left dorsal lateral aspects of the spinal canal resulting in moderate spinal canal stenosis with effacement of the thecal sac but no frank cord compression or cord edema. There is mild loss of vertebral body height at both levels consistent with associated pathologic fractures. There is compression deformity of the T11 vertebral body with up to approximately 30% loss of vertebral body height anteriorly and minimal bony retropulsion. There is faint edema along the superior endplate consistent with acute to subacute chronicity. This fracture may be pathologic. There are probable additional lesions involving multiple additional ribs (for example 25-21, 25-24). The above findings are new since the MRI from 2022. Cord:  There is no cord signal abnormality or abnormal enhancement. Paraspinal and other soft tissues: Unremarkable, aside from the bulky paraspinal tumor described above. Disc levels: There is a small disc protrusion at T10-T11 without significant spinal canal or neural foraminal stenosis. Otherwise, there is overall minimal background degenerative change without significant spinal canal or neural foraminal stenosis. IMPRESSION: 1. Large soft tissue mass in the left paraspinal soft tissues at T6-T7 with destruction of the seventh rib and invasion of the T6 and T7 vertebral bodies with involvement of the left posterior elements as well as epidural tumor in the spinal canal resulting in moderate spinal canal stenosis without frank cord compression or cord edema. 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 2. Acute to subacute compression fracture of the T11 vertebral body with up to approximately 30% loss of vertebral body height and minimal bony retropulsion, possibly pathologic. 3. Small enhancing lesion in the right occipital calvarium, not definitely present on the prior cervical spine  from 2022 (though possibly not included within the field of view on that study), suspicious for an additional metastatic or myeloma lesion. 4. Additional probable lesions involving multiple additional ribs. 5. No acute or suspicious finding in the cervical spine. Electronically Signed   By: Lesia Hausen M.D.   On: 01/24/2023 14:33   CT Angio Chest PE W and/or Wo Contrast  Result Date: 01/24/2023 CLINICAL DATA:  Chest pain and shortness of breath for a month. EXAM: CT ANGIOGRAPHY CHEST WITH CONTRAST TECHNIQUE: Multidetector CT imaging of the chest was performed using the standard protocol during bolus administration of intravenous contrast. Multiplanar CT image reconstructions and MIPs were obtained to evaluate the vascular anatomy. RADIATION DOSE REDUCTION: This exam was performed according to the departmental dose-optimization program which includes automated exposure control, adjustment of the mA and/or kV according to patient size and/or use of iterative reconstruction technique. CONTRAST:  75mL OMNIPAQUE IOHEXOL 350 MG/ML SOLN COMPARISON:  X-ray 01/24/2023 and older.  Previous CT January 2017 FINDINGS: Cardiovascular: The thoracic aorta has a normal course and caliber with minimal calcified plaque. There is a bovine type aortic arch, normal variant.  Coronary artery calcifications are seen. Please correlate for other coronary risk factors. Heart is nonenlarged. There is slight wall thickening of the left ventricle. Prominent fat along the intra-atrial septum. Pulmonary arteries show some slight enlargement centrally. Please correlate for any evidence of pulmonary artery hypertension. There is heterogeneous enhancement of the pulmonary arterial tree limiting evaluation for emboli. No obvious large and central embolus although several areas are nondiagnostic. Mediastinum/Nodes: Surgical changes identified with a moderate hiatal hernia. Small thyroid gland. No specific abnormal lymph node enlargement identified  in the axillary region, hilum or mediastinum. There are some small nodes identified in the posterior mediastinum, posterior to the descending thoracic aorta such as series 4, image 86 but not pathologic by size criteria. Lungs/Pleura: There is some linear opacity lung bases likely scar or atelectasis. No consolidation. Breathing motion identified. No pleural effusion or pneumothorax. 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 Benign hepatic cysts as well. Musculoskeletal: There is large destructive mass involving the posterior aspect of the left seventh rib with extension to involve the central canal of the spine in the associated vertebral bodies of T6 and T7. Cord compression is possible. This mass is estimated in diameter on series 4, image 65 at 8.5 by 4.3 cm. Slight extension along the extrapleural space in the posterior mediastinum as well. There is question of some subtle other lucent lesions identified along the spine but indeterminate. Critical Value/emergent results were called by telephone at the time of interpretation on 01/24/2023 at 8:41 am to provider [facility] , who verbally acknowledged these results. Review of the MIP images confirms the above findings. IMPRESSION: Aggressive soft tissue mass with bone destruction involving the left seventh rib as well as the T6 and T7 vertebral bodies and central canal. A neoplastic process is possible. There also is a possibility of significant cord compression with canal encroachment along the spine. Recommend further evaluation with spine MRI with and without contrast and a whole-body bone scan. There appears to be some other subtle lucent bone lesions along the spine and sternum. Please correlate for any history of known malignancy including such processes as myeloma or other. Poor opacification of the pulmonary arterial tree limiting evaluation. Several areas are non diagnostic for pulmonary emboli.  No obvious large and central embolus. Aortic Atherosclerosis (ICD10-I70.0). Electronically Signed   By: Karen Kays M.D.   On: 01/24/2023 11:47   DG Chest 2 View  Result Date: 01/24/2023 CLINICAL DATA:  Chest pain EXAM: CHEST - 2 VIEW COMPARISON:  Chest x-ray dated January 07, 2023 FINDINGS: The heart size and mediastinal contours are within normal limits. Small hiatal hernia. Both lungs are clear. Pleural-based nodular opacity of the left posterior hemithorax. The visualized skeletal structures are unremarkable. IMPRESSION: Pleural-based nodular opacity of the left posterior hemithorax. Recommend further evaluation with contrast enhanced chest CT. Electronically Signed   By: Allegra Lai M.D.   On: 01/24/2023 09:38    PERFORMANCE STATUS (ECOG) : 1 - Symptomatic but completely ambulatory  Review of Systems Unless otherwise noted, a complete review of systems is negative.  Physical Exam General: NAD Pulmonary: Unlabored Extremities: no edema, no joint deformities Skin: no rashes Neurological: Weakness but otherwise nonfocal  IMPRESSION: Follow-up visit.  Serum creatinine slightly improved today.  Patient states that she feels okay.  Pain is stable on as needed oral hydromorphone.  Will hold on making any further adjustments to pain regimen at present but can consider liberalizing dose of transdermal fentanyl if  needed.  Patient is tearful today and endorses depression and anxiety.  She has been followed by a psychiatrist in [place] for several months and has been on escitalopram 10 mg over that period of time.  It would be reasonable to increase dose to 20 mg daily.  However, will wait for renal recovery prior to making any dose adjustment.  Also discussed options for counseling and mental health support at the cancer center.  Patient has a [place] faith background and is interested in speaking with the chaplain today.  Denies constipation.  PLAN: -Continue current scope of  treatment -Continue fentanyl/hydromorphone -Can consider increasing dose of escitalopram once renal function improves -Daily bowel regimen -Referral to chaplain -Will follow   Time Total: 15 minutes  Visit consisted of counseling and education dealing with the complex and emotionally intense issues of symptom management and palliative care in the setting of serious and potentially life-threatening illness.Greater than 50%  of this time was spent counseling and coordinating care related to the above assessment and plan.  Signed by: Laurette Schimke, PhD, NP-C

## 2023-02-13 NOTE — Progress Notes (Signed)
Upon arrival Paula Garrett was laying in bed alert. Openly shared a life review including significant grief in the past year including the death of her mother. She is also grieving not being available for her granddaughters senior year of high school. She would like to make this a priority. Her support system is strong. Faith is a source of comfort. Understanding of disease progression and wants to "fight this". Chaplain offered listening presence, aiding in helping with prioritizing.     02/13/23 1800  Spiritual Encounters  Type of Visit Follow up  Care provided to: Patient  Conversation partners present during encounter Physician  Referral source Physician  Reason for visit Urgent spiritual support  OnCall Visit Yes  Spiritual Framework  Presenting Themes Coping tools

## 2023-02-14 ENCOUNTER — Other Ambulatory Visit: Payer: Self-pay | Admitting: Pharmacist

## 2023-02-14 ENCOUNTER — Other Ambulatory Visit: Payer: Self-pay | Admitting: Oncology

## 2023-02-14 DIAGNOSIS — N179 Acute kidney failure, unspecified: Secondary | ICD-10-CM | POA: Diagnosis not present

## 2023-02-14 DIAGNOSIS — C9000 Multiple myeloma not having achieved remission: Secondary | ICD-10-CM | POA: Diagnosis not present

## 2023-02-14 LAB — CBC
HCT: 23.3 % — ABNORMAL LOW (ref 36.0–46.0)
Hemoglobin: 7.7 g/dL — ABNORMAL LOW (ref 12.0–15.0)
MCH: 30.6 pg (ref 26.0–34.0)
MCHC: 33 g/dL (ref 30.0–36.0)
MCV: 92.5 fL (ref 80.0–100.0)
Platelets: 413 10*3/uL — ABNORMAL HIGH (ref 150–400)
RBC: 2.52 MIL/uL — ABNORMAL LOW (ref 3.87–5.11)
RDW: 14.9 % (ref 11.5–15.5)
WBC: 8.7 10*3/uL (ref 4.0–10.5)
nRBC: 0 % (ref 0.0–0.2)

## 2023-02-14 LAB — KAPPA/LAMBDA LIGHT CHAINS
Kappa free light chain: 24.2 mg/L — ABNORMAL HIGH (ref 3.3–19.4)
Kappa, lambda light chain ratio: 0.02 — ABNORMAL LOW (ref 0.26–1.65)
Lambda free light chains: 1393.1 mg/L — ABNORMAL HIGH (ref 5.7–26.3)

## 2023-02-14 LAB — BASIC METABOLIC PANEL
Anion gap: 17 — ABNORMAL HIGH (ref 5–15)
BUN: 70 mg/dL — ABNORMAL HIGH (ref 6–20)
CO2: 17 mmol/L — ABNORMAL LOW (ref 22–32)
Calcium: 8.3 mg/dL — ABNORMAL LOW (ref 8.9–10.3)
Chloride: 102 mmol/L (ref 98–111)
Creatinine, Ser: 3.97 mg/dL — ABNORMAL HIGH (ref 0.44–1.00)
GFR, Estimated: 12 mL/min — ABNORMAL LOW (ref >60)
Glucose, Bld: 90 mg/dL (ref 70–99)
Potassium: 5.3 mmol/L — ABNORMAL HIGH (ref 3.5–5.1)
Sodium: 132 mmol/L — ABNORMAL LOW (ref 135–145)

## 2023-02-14 LAB — BPAM RBC
Blood Product Expiration Date: 202409042359
ISSUE DATE / TIME: 202408291055
Unit Type and Rh: 600

## 2023-02-14 LAB — TYPE AND SCREEN
ABO/RH(D): A POS
Antibody Screen: NEGATIVE
Unit division: 0

## 2023-02-14 LAB — MAGNESIUM: Magnesium: 1.8 mg/dL (ref 1.7–2.4)

## 2023-02-14 MED ORDER — PALONOSETRON HCL INJECTION 0.25 MG/5ML
0.2500 mg | Freq: Once | INTRAVENOUS | Status: AC
  Administered 2023-02-15: 0.25 mg via INTRAVENOUS
  Filled 2023-02-14: qty 5

## 2023-02-14 MED ORDER — DIPHENHYDRAMINE HCL 50 MG/ML IJ SOLN: 50.0000 mg | Freq: Once | INTRAMUSCULAR | Status: DC | PRN

## 2023-02-14 MED ORDER — METHYLPREDNISOLONE SODIUM SUCC 125 MG IJ SOLR: 125.0000 mg | Freq: Once | INTRAMUSCULAR | Status: DC | PRN

## 2023-02-14 MED ORDER — ALBUTEROL SULFATE (2.5 MG/3ML) 0.083% IN NEBU
2.5000 mg | INHALATION_SOLUTION | Freq: Once | RESPIRATORY_TRACT | Status: DC | PRN
  Filled 2023-02-14: qty 3

## 2023-02-14 MED ORDER — SODIUM CHLORIDE 0.9 % IV SOLN: Freq: Once | INTRAVENOUS | Status: DC

## 2023-02-14 MED ORDER — FAMOTIDINE IN NACL 20-0.9 MG/50ML-% IV SOLN: 20.0000 mg | Freq: Once | INTRAVENOUS | Status: DC | PRN

## 2023-02-14 MED ORDER — ALTEPLASE 2 MG IJ SOLR: 2.0000 mg | Freq: Once | INTRAMUSCULAR | Status: DC | PRN

## 2023-02-14 MED ORDER — BORTEZOMIB CHEMO SQ INJECTION 3.5 MG (2.5MG/ML)
1.3000 mg/m2 | Freq: Once | INTRAMUSCULAR | Status: AC
  Administered 2023-02-15: 2.5 mg via SUBCUTANEOUS
  Filled 2023-02-14: qty 1

## 2023-02-14 MED ORDER — SODIUM CHLORIDE 0.9% FLUSH: 10.0000 mL | INTRAVENOUS | Status: DC | PRN

## 2023-02-14 MED ORDER — SODIUM CHLORIDE 0.9 % IV SOLN: Freq: Once | INTRAVENOUS | Status: DC | PRN

## 2023-02-14 MED ORDER — SODIUM CHLORIDE 0.9% FLUSH: 3.0000 mL | INTRAVENOUS | Status: DC | PRN

## 2023-02-14 MED ORDER — HEPARIN SOD (PORK) LOCK FLUSH 100 UNIT/ML IV SOLN: 500.0000 [IU] | Freq: Once | INTRAVENOUS | Status: DC | PRN

## 2023-02-14 MED ORDER — EPINEPHRINE 0.3 MG/0.3ML IJ SOAJ: 0.3000 mg | Freq: Once | INTRAMUSCULAR | Status: DC | PRN

## 2023-02-14 MED ORDER — HEPARIN SOD (PORK) LOCK FLUSH 100 UNIT/ML IV SOLN: 250.0000 [IU] | Freq: Once | INTRAVENOUS | Status: DC | PRN

## 2023-02-14 NOTE — Progress Notes (Signed)
PROGRESS NOTE    Paula Garrett  DUK:[id number] DOB: [birth year] DOA: 02/10/2023 PCP: Paula Cords, DO  105A/105A-AA  LOS: 4 days   Brief hospital course:   Assessment & Plan: Paula Garrett is a 57 y.o. female with multiple medical problems including hypertension, depression, anxiety, history of tobacco abuse, and multiple myeloma, who was hospitalized 01/24/2023 to 02/06/2023 with chest pain and shortness of breath and was found to have a large soft tissue mass in the left T6-T7 paraspinal area with destruction of seventh rib and invasion of T6 and T7 vertebral bodies.  Patient underwent surgery on 8/13 with postop period complicated by pain.  Pathology positive for multiple myeloma.  Patient now readmitted 02/10/2023 with AKI      AKI in the setting of multiple myeloma -- baseline creatinine .93 -- patient came in with creatinine of 5.27--- IV fluids-- 5.3 -- patient making good urine. -- Nephrology consultation with Dr. Wynelle Link.  -- Renal ultrasound no obstructive neuropathy Plan: --cont MIVF   Hyperkalemia -- no EKG changes. Patient received hyperkalemia cocktail in the ER. --lokelma 10g x2 --management per nephro  Hyponatremia -- suspected due to renal failure   Multiple myeloma -- patient seen by Dr. Smith Robert -- given significant kidney issues from multiple myeloma patient to be started on inpatient chemotherapy  -- Follow further recommendations per Dr. Smith Robert --cont decadron 40 mg daily --cont Velcade   Recent Mass of spinal cord [facility]) --Neurosurgical procedure done on 8/13 by Dr. Katrinka Blazing.   --Patient had a posterior segmental instrumentation T4-T9, posterior lateral arthrodesis from T4-T9, thoracic laminectomy at T6 and T7.   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  --pain management (switched from home oxycontin to Fentanyl patch, switched from home oxycodone PRN to oral dialudid PRN)   Chronic back pain --pain management (switched from home oxycontin to  Fentanyl patch, switched from home oxycodone PRN to oral dialudid PRN) --cont gabapentin   Asthma COPD -stable --cont daily bronchodilators   Anxiety depression -- continue Lexapro and Klonopin   Hypothyroidism -- continue Synthroid  Anemia --s/p 1u pRBC (irradiate)  --monitor and transfuse to keep Hgb >7  Mild encephalopathy, ruled out   DVT prophylaxis: Heparin SQ Code Status: Full code  Family Communication:  Level of care: Telemetry Medical Dispo:   The patient is from: home Anticipated d/c is to: home Anticipated d/c date is: > 3 days   Subjective and Interval History:  No new complaint today.  Good urine output.  D/c Foley today.   Objective: Vitals:   02/13/23 1116 02/13/23 1532 02/14/23 0344 02/14/23 0730  BP: 113/61 130/71 (!) 153/92 (!) 145/95  Pulse: 80 72 77 70  Resp: 18 18 17 18   Temp: 98.1 F (36.7 C) 98.3 F (36.8 C)  97.8 F (36.6 C)  TempSrc: Oral   Oral  SpO2:  94% 100% 100%  Weight:      Height:        Intake/Output Summary (Last 24 hours) at 02/14/2023 1910 Last data filed at 02/14/2023 1209 Gross per 24 hour  Intake 350 ml  Output 4450 ml  Net -4100 ml   Filed Weights   02/10/23 1805 02/12/23 1053  Weight: 81.6 kg 88.5 kg    Examination:   Constitutional: NAD, AAOx3 HEENT: conjunctivae and lids normal, EOMI CV: No cyanosis.   RESP: normal respiratory effort, on RA Neuro: II - XII grossly intact.   Psych: depressed mood and affect.   Foley present.   Data Reviewed:  I have personally reviewed labs and imaging studies  Time spent: 35 minutes  Darlin Priestly, MD [facility] If 7PM-7AM, please contact night-coverage 02/14/2023, 7:10 PM

## 2023-02-14 NOTE — Progress Notes (Signed)
Pt refuses bed alarm despite education and safety risk, RN Marena Chancy aware

## 2023-02-14 NOTE — Progress Notes (Signed)
Ok to proceed with bortezomib on 8/31 with hemoglobin=7.7, creatinine=3.97 and using CBC with diff from 8/26 per Dr. Smith Robert.

## 2023-02-14 NOTE — Progress Notes (Signed)
Hematology/Oncology Consult note [facility]  [phone] [phone]  Patient Care Team: Smitty Cords, DO as PCP - General (Family Medicine) Lemar Livings, Merrily Pew, MD (General Surgery) Shelia Media, MD (Internal Medicine) Creig Hines, MD as Consulting Physician (Oncology)   Name of the patient: Alyla Niederhauser  [id number]  [birth year]   Date of visit: @TODAY @  Diagnosis- ***  Chief complaint/ Reason for visit- ***  Heme/Onc history: ***  Interval history- ***  ECOG PS- *** Pain scale- *** Opioid associated constipation- ***  Review of systems- ROS    Allergies  Allergen Reactions   Hctz [Hydrochlorothiazide]     Hyponatremia      Past Medical History:  Diagnosis Date   Anxiety    Arthritis    Asthma    Barrett's esophagus 2015   COVID-19    03/10/20   Depression    GERD (gastroesophageal reflux disease)    Hypertension    Hypothyroidism    Pneumonia    Smoldering myeloma    Thyroid disease      Past Surgical History:  Procedure Laterality Date   ABLATION  2006   APPLICATION OF INTRAOPERATIVE CT SCAN N/A 01/28/2023   Procedure: APPLICATION OF INTRAOPERATIVE CT SCAN;  Surgeon: Loreen Freud, MD;  Location: [facility] ORS;  Service: Neurosurgery;  Laterality: N/A;   BREAST CYST ASPIRATION Left 1998   BREAST SURGERY     cyst aspiration   BREAST SURGERY  1998   cyst aspiration   COLONOSCOPY  03/2014   COLONOSCOPY WITH PROPOFOL N/A 01/06/2020   Procedure: COLONOSCOPY WITH PROPOFOL;  Surgeon: Toledo, Boykin Nearing, MD;  Location: [facility] ENDOSCOPY;  Service: Gastroenterology;  Laterality: N/A;   ENDOMETRIAL ABLATION     ESOPHAGOGASTRODUODENOSCOPY (EGD) WITH PROPOFOL N/A 08/15/2015   Procedure: ESOPHAGOGASTRODUODENOSCOPY (EGD) WITH PROPOFOL;  Surgeon: Christena Deem, MD;  Location: [facility] ENDOSCOPY;  Service: Endoscopy;  Laterality: N/A;   ESOPHAGOGASTRODUODENOSCOPY (EGD) WITH PROPOFOL N/A 01/22/2016    Procedure: ESOPHAGOGASTRODUODENOSCOPY (EGD) WITH PROPOFOL;  Surgeon: Christena Deem, MD;  Location: [facility] ENDOSCOPY;  Service: Endoscopy;  Laterality: N/A;   ESOPHAGOGASTRODUODENOSCOPY (EGD) WITH PROPOFOL N/A 01/06/2020   Procedure: ESOPHAGOGASTRODUODENOSCOPY (EGD) WITH PROPOFOL;  Surgeon: Toledo, Boykin Nearing, MD;  Location: [facility] ENDOSCOPY;  Service: Gastroenterology;  Laterality: N/A;   GASTRIC BYPASS  2005   HERNIA REPAIR     IR BONE MARROW BIOPSY & ASPIRATION  02/05/2023   NASAL SINUS SURGERY     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   TOTAL THYROIDECTOMY     TUBAL LIGATION      Social History   Socioeconomic History   Marital status: Divorced    Spouse name: Not on file   Number of children: 2   Years of education: Not on file   Highest education level: GED or equivalent  Occupational History   Occupation: Part-time  Tobacco Use   Smoking status: Every Day    Current packs/day: 1.00    Average packs/day: 1 pack/day for 42.9 years (42.9 ttl pk-yrs)    Types: Cigarettes    Start date: 03/15/1980   Smokeless tobacco: Never  Vaping Use   Vaping status: Never Used  Substance and Sexual Activity   Alcohol use: Yes    Alcohol/week: 10.0 - 12.0 standard drinks of alcohol    Types: 8 - 10 Glasses of wine, 2 Standard drinks or equivalent per week   Drug use: No   Sexual activity: Yes  Partners: Male  Other Topics Concern   Not on file  Social History Narrative   Lives in [place] single, divorced       Work - 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       Diet - healthy diet   Exercise - limited   Caffeine use: daily   Social Determinants of Health   Financial Resource Strain: Low Risk  (11/01/2022)   Overall Financial Resource Strain (CARDIA)    Difficulty of Paying Living Expenses: Not very hard  Food Insecurity: No Food Insecurity (02/10/2023)   Hunger Vital Sign    Worried About Running Out of Food in the Last Year: Never true    Ran Out of Food in the Last Year:  Never true  Transportation Needs: No Transportation Needs (02/10/2023)   PRAPARE - Administrator, Civil Service (Medical): No    Lack of Transportation (Non-Medical): No  Physical Activity: Insufficiently Active (11/01/2022)   Exercise Vital Sign    Days of Exercise per Week: 3 days    Minutes of Exercise per Session: 20 min  Stress: Stress Concern Present (11/01/2022)   [employer] of Occupational Health - Occupational Stress Questionnaire    Feeling of Stress : Very much  Social Connections: Socially Isolated (11/01/2022)   Social Connection and Isolation Panel [NHANES]    Frequency of Communication with Friends and Family: More than three times a week    Frequency of Social Gatherings with Friends and Family: Once a week    Attends Religious Services: Never    Database administrator or Organizations: No    Attends Engineer, structural: Not on file    Marital Status: Divorced  Intimate Partner Violence: Not At Risk (02/10/2023)   Humiliation, Afraid, Rape, and Kick questionnaire    Fear of Current or Ex-Partner: No    Emotionally Abused: No    Physically Abused: No    Sexually Abused: No    Family History  Problem Relation Age of Onset   Heart disease Mother    COPD Mother    Arthritis Mother    Cancer Mother        uterine   Lupus Mother    Anxiety disorder Mother    Depression Mother    Drug abuse Mother    COPD Father    Arthritis Father    Heart disease Father    Heart attack Father    Alcohol abuse Father    Heart disease Brother    Heart attack Brother    Drug abuse Brother    Anxiety disorder Brother    Depression Brother    Heart disease Brother    Cancer Brother        liver cancer age 81 y.o   Diabetes Maternal Grandmother    Diabetes Maternal Grandfather    Diabetes Paternal Grandmother    Diabetes Paternal Grandfather    Breast cancer Neg Hx      Current Facility-Administered Medications:    0.9 %  sodium chloride  infusion, , Intravenous, Once PRN, Creig Hines, MD   0.9 %  sodium chloride infusion, , Intravenous, Continuous, Enedina Finner, MD, Last Rate: 150 mL/hr at 02/14/23 0825, New Bag at 02/14/23 0825   0.9 %  sodium chloride infusion, , Intravenous, Once, Creig Hines, MD   0.9 %  sodium chloride infusion, , Intravenous, Once PRN, Creig Hines, MD   albuterol (PROVENTIL) (2.5 MG/3ML) 0.083% nebulizer solution 2.5 mg,  2.5 mg, Nebulization, Once PRN, Creig Hines, MD   albuterol (PROVENTIL) (2.5 MG/3ML) 0.083% nebulizer solution 2.5 mg, 2.5 mg, Nebulization, Once PRN, Creig Hines, MD   albuterol (PROVENTIL) (2.5 MG/3ML) 0.083% nebulizer solution 3 mL, 3 mL, Inhalation, Q6H PRN, Mikey College T, MD   allopurinol (ZYLOPRIM) tablet 100 mg, 100 mg, Oral, Daily, Creig Hines, MD, 100 mg at 02/14/23 4098   alteplase (CATHFLO ACTIVASE) injection 2 mg, 2 mg, Intracatheter, Once PRN, Creig Hines, MD   alteplase (CATHFLO ACTIVASE) injection 2 mg, 2 mg, Intracatheter, Once PRN, Creig Hines, MD   bisacodyl (DULCOLAX) EC tablet 10 mg, 10 mg, Oral, Daily, Enedina Finner, MD, 10 mg at 02/14/23 0808   [START ON 02/15/2023] bortezomib SQ (VELCADE) chemo injection (2.5mg /mL concentration) 2.5 mg, 1.3 mg/m2 (Treatment Plan Recorded), Subcutaneous, Once, Creig Hines, MD   Chlorhexidine Gluconate Cloth 2 % PADS 6 each, 6 each, Topical, Daily, Enedina Finner, MD, 6 each at 02/14/23 1191   clonazePAM (KLONOPIN) tablet 0.5 mg, 0.5 mg, Oral, BID, Mikey College T, MD, 0.5 mg at 02/14/23 4782   cyclobenzaprine (FLEXERIL) tablet 5-10 mg, 5-10 mg, Oral, TID PRN, Mikey College T, MD, 10 mg at 02/14/23 9562   diphenhydrAMINE (BENADRYL) injection 50 mg, 50 mg, Intravenous, Once PRN, Creig Hines, MD   diphenhydrAMINE (BENADRYL) injection 50 mg, 50 mg, Intravenous, Once PRN, Creig Hines, MD   docusate sodium (COLACE) capsule 100 mg, 100 mg, Oral, BID, Chipper Herb, Ping T, MD, 100 mg at 02/14/23 1308   EPINEPHrine (EPI-PEN)  injection 0.3 mg, 0.3 mg, Intramuscular, Once PRN, Creig Hines, MD   EPINEPHrine (EPI-PEN) injection 0.3 mg, 0.3 mg, Intramuscular, Once PRN, Creig Hines, MD   escitalopram (LEXAPRO) tablet 10 mg, 10 mg, Oral, Daily, Chipper Herb, Ping T, MD, 10 mg at 02/14/23 6578   famotidine (PEPCID) IVPB 20 mg premix, 20 mg, Intravenous, Once PRN, Creig Hines, MD   famotidine (PEPCID) IVPB 20 mg premix, 20 mg, Intravenous, Once PRN, Creig Hines, MD   fentaNYL (DURAGESIC) 12 MCG/HR 1 patch, 1 patch, Transdermal, Q72H, Enedina Finner, MD, 1 patch at 02/14/23 0817   gabapentin (NEURONTIN) capsule 100 mg, 100 mg, Oral, BID, Mikey College T, MD, 100 mg at 02/14/23 4696   heparin injection 5,000 Units, 5,000 Units, Subcutaneous, Q12H, Mikey College T, MD, 5,000 Units at 02/14/23 1003   heparin lock flush 100 unit/mL, 500 Units, Intracatheter, Once PRN, Creig Hines, MD   heparin lock flush 100 unit/mL, 250 Units, Intracatheter, Once PRN, Creig Hines, MD   heparin lock flush 100 unit/mL, 500 Units, Intracatheter, Once PRN, Creig Hines, MD   heparin lock flush 100 unit/mL, 250 Units, Intracatheter, Once PRN, Creig Hines, MD   HYDROmorphone (DILAUDID) tablet 1-2 mg, 1-2 mg, Oral, Q4H PRN, Borders, Daryl Eastern, NP, 2 mg at 02/14/23 1529   levothyroxine (SYNTHROID) tablet 250 mcg, 250 mcg, Oral, QAC breakfast, Mikey College T, MD, 250 mcg at 02/14/23 0535   loratadine (CLARITIN) tablet 10 mg, 10 mg, Oral, Daily, Mikey College T, MD, 10 mg at 02/14/23 2952   methylPREDNISolone sodium succinate (SOLU-MEDROL) 125 mg/2 mL injection 125 mg, 125 mg, Intravenous, Once PRN, Creig Hines, MD   methylPREDNISolone sodium succinate (SOLU-MEDROL) 125 mg/2 mL injection 125 mg, 125 mg, Intravenous, Once PRN, Creig Hines, MD   mometasone-formoterol [facility]) 200-5 MCG/ACT inhaler 2 puff, 2 puff, Inhalation, BID, Emeline General, MD, 2 puff at 02/14/23 1003  mupirocin ointment (BACTROBAN) 2 % 1 Application, 1 Application, Nasal, BID,  Mikey College T, MD, 1 Application at 02/14/23 1003   [START ON 02/15/2023] palonosetron (ALOXI) injection 0.25 mg, 0.25 mg, Intravenous, Once, Creig Hines, MD   pantoprazole (PROTONIX) EC tablet 40 mg, 40 mg, Oral, Daily, Mikey College T, MD, 40 mg at 02/14/23 0807   polyethylene glycol (MIRALAX / GLYCOLAX) packet 17 g, 17 g, Oral, Daily, Borders, Daryl Eastern, NP, 17 g at 02/14/23 1610   sodium bicarbonate tablet 650 mg, 650 mg, Oral, TID, Kolluru, Sarath, MD, 650 mg at 02/14/23 1529   sodium chloride flush (NS) 0.9 % injection 10 mL, 10 mL, Intracatheter, PRN, Creig Hines, MD   sodium chloride flush (NS) 0.9 % injection 10 mL, 10 mL, Intracatheter, PRN, Creig Hines, MD   sodium chloride flush (NS) 0.9 % injection 3 mL, 3 mL, Intracatheter, PRN, Creig Hines, MD   sodium chloride flush (NS) 0.9 % injection 3 mL, 3 mL, Intracatheter, PRN, Creig Hines, MD   umeclidinium bromide (INCRUSE ELLIPTA) 62.5 MCG/ACT 1 puff, 1 puff, Inhalation, Daily, Mikey College T, MD, 1 puff at 02/14/23 0829   zolpidem (AMBIEN) tablet 5-10 mg, 5-10 mg, Oral, QHS PRN, Mikey College T, MD, 10 mg at 02/14/23 0041  Physical exam:  Vitals:   02/13/23 1116 02/13/23 1532 02/14/23 0344 02/14/23 0730  BP: 113/61 130/71 (!) 153/92 (!) 145/95  Pulse: 80 72 77 70  Resp: 18 18 17 18   Temp: 98.1 F (36.7 C) 98.3 F (36.8 C)  97.8 F (36.6 C)  TempSrc: Oral   Oral  SpO2:  94% 100% 100%  Weight:      Height:       Physical Exam      Latest Ref Rng & Units 02/14/2023    3:22 AM  CMP  Glucose 70 - 99 mg/dL 90   BUN 6 - 20 mg/dL 70   Creatinine 9.60 - 1.00 mg/dL 4.54   Sodium 098 - 119 mmol/L 132   Potassium 3.5 - 5.1 mmol/L 5.3   Chloride 98 - 111 mmol/L 102   CO2 22 - 32 mmol/L 17   Calcium 8.9 - 10.3 mg/dL 8.3       Latest Ref Rng & Units 02/14/2023    3:22 AM  CBC  WBC 4.0 - 10.5 K/uL 8.7   Hemoglobin 12.0 - 15.0 g/dL 7.7   Hematocrit 14.7 - 46.0 % 23.3   Platelets 150 - 400 K/uL 413     @IMAGES @  NM  PET Image Initial (PI) Whole Body (F-18 FDG)  Result Date: 02/12/2023 CLINICAL DATA:  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 EXAM: NUCLEAR MEDICINE PET WHOLE BODY TECHNIQUE: 10.1 mCi F-18 FDG was injected intravenously. Full-ring PET imaging was performed from the head to foot after the radiotracer. CT data was obtained and used for attenuation correction and anatomic localization. Fasting blood glucose: 100 mg/dl COMPARISON:  CT chest 82/95/6213 FINDINGS: Mediastinal blood pool activity: SUV max HEAD/NECK: No hypermetabolic activity in the scalp. No hypermetabolic cervical lymph nodes. Incidental CT findings: none CHEST: At the LEFT paraspinal resection site (T6-T7), there is residual fluid and soft tissue density tissue measuring 6.1 x 3.7 cm. This tissue has no significant metabolic activity. Activity is equal to background blood pool activity. Posterior fusion at this level. No hypermetabolic tissue within the thorax to suggest malignancy within the bones or soft tissue. There are several  small nodules in the LEFT lung apex (image 54/6) without metabolic activity. These are new from comparison CT 01/24/2023 and therefore favored foci of infection or inflammation. Small bilateral pleural effusions are present greater on the LEFT. Incidental CT findings: none ABDOMEN/PELVIS: There is moderate metabolic activity through the GE junction. There is postsurgical anatomy of the proximal stomach. No abnormal metabolic activity liver. Scattered physiologic activity in the bowel. No hypermetabolic lymph nodes in the abdomen pelvis. Incidental CT findings: Foley catheter in bladder. SKELETON: Hypermetabolic lesion in the L4 vertebral body with SUV max equal 5.4. associated lytic lesion measuring 19 mm on image 121/series 6. EXTREMITIES: No abnormal hypermetabolic activity in the lower extremities. Incidental CT findings: No sclerotic or lytic lesions on  CT imaging. IMPRESSION: 1. Solitary hypermetabolic lytic lesion in the L4 vertebral body consistent multiple myeloma. Metabolic activity is moderate. 2. Residual soft tissue density at the LEFT paraspinal resection site (T6-T7) with no significant metabolic activity. Favor benign postsurgical tissue. 3. Small bilateral pleural effusions greater on the LEFT. 4. Small nodules in the LEFT lung apex without metabolic activity are favored foci of infection or inflammation. 5. Moderate metabolic activity through the GE junction is favored physiologic or inflammatory. Electronically Signed   By: Genevive Bi M.D.   On: 02/12/2023 13:47   US RENAL  Result Date: 02/10/2023 CLINICAL DATA:  AKI EXAM: RENAL / URINARY TRACT ULTRASOUND COMPLETE COMPARISON:  None Available. FINDINGS: Right Kidney: Renal measurements: 12.5 cm x 5.6 cm x 4.6 cm = volume: 165 mL. Parenchymal echogenicity is normal. There is no hydronephrosis. Left Kidney: Renal measurements: 12.6 cm x 6.4 cm x 5.0 cm = volume: 213 mL. Parenchymal echogenicity is normal. There is no hydronephrosis. Bladder: Decompressed by a Foley catheter. Other: None. IMPRESSION: Unremarkable renal ultrasound. Electronically Signed   By: Lesia Hausen M.D.   On: 02/10/2023 17:03   DG Chest 1 View  Result Date: 02/10/2023 CLINICAL DATA:  Acute kidney injury. Sodium 120. Elevated creatinine. EXAM: CHEST  1 VIEW COMPARISON:  02/01/2023 FINDINGS: Shallow inspiration. Cardiac enlargement. Hazy interstitial infiltrates throughout both lungs most likely representing edema although possibly interstitial pneumonia or chronic fibrosis. Small left pleural effusion with basilar atelectasis or consolidation. This could indicate pneumonia in the appropriate clinical setting. No pneumothorax. Postoperative changes in the thoracic spine. No significant change since prior study. IMPRESSION: Cardiac enlargement with probable interstitial pulmonary edema. Small left pleural effusion with  basilar atelectasis or consolidation. Similar appearance to previous study. Electronically Signed   By: Burman Nieves M.D.   On: 02/10/2023 15:50   IR BONE MARROW BIOPSY & ASPIRATION  Result Date: 02/05/2023 INDICATION: Hematologic abnormality EXAM: Bone marrow aspiration and core biopsy using fluoroscopic guidance MEDICATIONS: None. ANESTHESIA/SEDATION: Moderate (conscious) sedation was employed during this procedure. A total of Versed 1.5 mg and Fentanyl 75 mcg was administered intravenously. Moderate Sedation Time: 10 minutes. The patient's level of consciousness and vital signs were monitored continuously by radiology nursing throughout the procedure under my direct supervision. FLUOROSCOPY TIME:  Fluoroscopy Time: 0.7 minutes (13 mGy) COMPLICATIONS: None immediate. PROCEDURE: Informed written consent was obtained from the patient after a thorough discussion of the procedural risks, benefits and alternatives. All questions were addressed. Maximal Sterile Barrier Technique was utilized including caps, mask, sterile gowns, sterile gloves, sterile drape, hand hygiene and skin antiseptic. A timeout was performed prior to the initiation of the procedure. The patient was placed prone on the exam table. Limited fluoroscopy of the pelvis was performed for planning purposes.  Skin entry site was marked, and the overlying skin was prepped and draped in the standard sterile fashion. Local analgesia was obtained with 1% lidocaine. Using fluoroscopic guidance, an 11 gauge needle was advanced just deep to the cortex of the right posterior ilium. Subsequently, bone marrow aspiration and core biopsy were performed. Specimens were submitted to lab/pathology for handling. Hemostasis was achieved with manual pressure, and a clean dressing was placed. The patient tolerated the procedure well without immediate complication. IMPRESSION: Successful bone marrow aspiration and core biopsy of the right posterior ilium.  Electronically Signed   By: Olive Bass M.D.   On: 02/05/2023 15:35   CT HEAD WO CONTRAST ( )  Result Date: 02/03/2023 CLINICAL DATA:  Mental status change, unknown cause. Acute hypoxic respiratory failure EXAM: CT HEAD WITHOUT CONTRAST TECHNIQUE: Contiguous axial images were obtained from the base of the skull through the vertex without intravenous contrast. RADIATION DOSE REDUCTION: This exam was performed according to the departmental dose-optimization program which includes automated exposure control, adjustment of the mA and/or kV according to patient size and/or use of iterative reconstruction technique. COMPARISON:  Bone survey 10/09/2022. FINDINGS: Brain: No acute intracranial abnormality. Specifically, no hemorrhage, hydrocephalus, mass lesion, acute infarction, or significant intracranial injury. Vascular: No hyperdense vessel or unexpected calcification. Skull: Focal lucent lesions throughout the skull. 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 These lytic lesions were not in the skull on the skull x-ray from 10/09/2022. Appearance is concerning for possible multiple myeloma. No fracture. Sinuses/Orbits: No acute findings Other: None IMPRESSION: No acute intracranial abnormality. Numerous scattered focal lucent lesions throughout the skull. These are new since prior bone survey from 10/09/2022 and concerning for multiple myeloma. Electronically Signed   By: Charlett Nose M.D.   On: 02/03/2023 16:30   DG Chest Port 1 View  Result Date: 02/01/2023 CLINICAL DATA:  Hypoxia EXAM: PORTABLE CHEST 1 VIEW COMPARISON:  01/30/2023 FINDINGS: Bilateral diffuse interstitial thickening and patchy alveolar airspace opacities at the right lung base and left mid lower lung. No pleural effusion or pneumothorax. Stable cardiomediastinal silhouette. No acute osseous abnormality. Posterior spinal fusion of the upper and midthoracic spine. IMPRESSION: 1. Bilateral diffuse interstitial  thickening and patchy alveolar airspace opacities at the right lung base and left mid lower lung concerning for pulmonary edema. Electronically Signed   By: Elige Ko M.D.   On: 02/01/2023 10:13   ECHOCARDIOGRAM COMPLETE  Result Date: 01/31/2023    ECHOCARDIOGRAM REPORT   Patient Name:   MARIATHERESA DRALLE Date of Exam: 01/31/2023 Medical Rec #:  [id number]      Height:       62.0 in Accession #:    [id number]     Weight:       170.0 lb Date of Birth:  [birth year]      BSA:          1.784 m Patient Age:    57 years       BP:           106/57 mmHg Patient Gender: F              HR:           110 bpm. Exam Location:  [facility] Procedure: 2D Echo, Cardiac Doppler and Color Doppler Indications:     Dyspnea  History:         Patient has no prior history of Echocardiogram examinations.  Signs/Symptoms:Dyspnea, Chest Pain, Fatigue and Shortness of                  Breath; Risk Factors:Hypertension, Sleep Apnea and Current                  Smoker.  Sonographer:     Mikki Harbor Referring Phys:  [id number] Alford Highland Diagnosing Phys: Julien Nordmann MD  Sonographer Comments: Image acquisition challenging due to respiratory motion. IMPRESSIONS  1. Left ventricular ejection fraction, by estimation, is 60 to 65%. The left ventricle has normal function. The left ventricle has no regional wall motion abnormalities. There is mild left ventricular hypertrophy. Left ventricular diastolic parameters are consistent with Grade I diastolic dysfunction (impaired relaxation).  2. Right ventricular systolic function is normal. The right ventricular size is normal. There is normal pulmonary artery systolic pressure. The estimated right ventricular systolic pressure is 30.9 mmHg.  3. Left atrial size was mildly dilated.  4. The mitral valve is normal in structure. No evidence of mitral valve regurgitation. No evidence of mitral stenosis.  5. The aortic valve is tricuspid. Aortic valve regurgitation is not visualized. No aortic  stenosis is present.  6. The inferior vena cava is normal in size with greater than 50% respiratory variability, suggesting right atrial pressure of 3 mmHg. FINDINGS  Left Ventricle: Left ventricular ejection fraction, by estimation, is 60 to 65%. The left ventricle has normal function. The left ventricle has no regional wall motion abnormalities. The left ventricular internal cavity size was normal in size. There is  mild left ventricular hypertrophy. Left ventricular diastolic parameters are consistent with Grade I diastolic dysfunction (impaired relaxation). Right Ventricle: The right ventricular size is normal. No increase in right ventricular wall thickness. Right ventricular systolic function is normal. There is normal pulmonary artery systolic pressure. The tricuspid regurgitant velocity is 2.64 m/s, and  with an assumed right atrial pressure of 3 mmHg, the estimated right ventricular systolic pressure is 30.9 mmHg. Left Atrium: Left atrial size was mildly dilated. Right Atrium: Right atrial size was normal in size. Pericardium: There is no evidence of pericardial effusion. Mitral Valve: The mitral valve is normal in structure. No evidence of mitral valve regurgitation. No evidence of mitral valve stenosis. MV peak gradient, 8.0 mmHg. The mean mitral valve gradient is 4.0 mmHg. Tricuspid Valve: The tricuspid valve is normal in structure. Tricuspid valve regurgitation is mild . No evidence of tricuspid stenosis. Aortic Valve: The aortic valve is tricuspid. Aortic valve regurgitation is not visualized. No aortic stenosis is present. Aortic valve mean gradient measures 9.0 mmHg. Aortic valve peak gradient measures 18.4 mmHg. Aortic valve area, by VTI measures 3.04  cm. Pulmonic Valve: The pulmonic valve was normal in structure. Pulmonic valve regurgitation is not visualized. No evidence of pulmonic stenosis. Aorta: The aortic root is normal in size and structure. Venous: The inferior vena cava is normal in size  with greater than 50% respiratory variability, suggesting right atrial pressure of 3 mmHg. IAS/Shunts: No atrial level shunt detected by color flow Doppler.  LEFT VENTRICLE PLAX 2D LVIDd:         5.80 cm   Diastology LVIDs:         3.60 cm   LV e' medial:    9.36 cm/s LV PW:         0.90 cm   LV E/e' medial:  9.7 LV IVS:        1.00 cm   LV e' lateral:   10.00  cm/s LVOT diam:     2.20 cm   LV E/e' lateral: 9.1 LV SV:         99 LV SV Index:   55 LVOT Area:     3.80 cm  RIGHT VENTRICLE RV Basal diam:  3.45 cm RV Mid diam:    3.00 cm RV S prime:     27.40 cm/s TAPSE (M-mode): 3.3 cm LEFT ATRIUM             Index        RIGHT ATRIUM           Index LA diam:        4.80 cm 2.69 cm/m   RA Area:     18.90 cm LA Vol (A2C):   59.6 ml 33.41 ml/m  RA Volume:   54.10 ml  30.32 ml/m LA Vol (A4C):   66.9 ml 37.50 ml/m LA Biplane Vol: 65.0 ml 36.43 ml/m  AORTIC VALVE AV Area (Vmax):    3.09 cm AV Area (Vmean):   3.04 cm AV Area (VTI):     3.04 cm AV Vmax:           214.50 cm/s AV Vmean:          136.750 cm/s AV VTI:            0.326 m AV Peak Grad:      18.4 mmHg AV Mean Grad:      9.0 mmHg LVOT Vmax:         174.50 cm/s LVOT Vmean:        109.500 cm/s LVOT VTI:          0.260 m LVOT/AV VTI ratio: 0.80  AORTA Ao Root diam: 3.20 cm MITRAL VALVE                TRICUSPID VALVE MV Area (PHT): 3.72 cm     TR Peak grad:   27.9 mmHg MV Area VTI:   2.90 cm     TR Vmax:        264.00 cm/s MV Peak grad:  8.0 mmHg MV Mean grad:  4.0 mmHg     SHUNTS MV Vmax:       1.41 m/s     Systemic VTI:  0.26 m MV Vmean:      91.6 cm/s    Systemic Diam: 2.20 cm MV Decel Time: 204 msec MV E velocity: 90.70 cm/s MV A velocity: 117.00 cm/s MV E/A ratio:  0.78 Julien Nordmann MD Electronically signed by Julien Nordmann MD Signature Date/Time: 01/31/2023/5:43:31 PM    Final    DG Thoracic Spine 2 View  Result Date: 01/31/2023 CLINICAL DATA:  Back pain. 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 EXAM: THORACIC SPINE 2 VIEWS COMPARISON:  MRI of the cervicothoracic spine 01/24/2023. Intraoperative imaging of the thoracic spine 01/28/2023. Chest CTA 01/24/2023. 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 Counting superiorly from the T12 level, the patient has undergone midthoracic spinal decompression with posterior pedicle screw and rod fusion from T4 through T9. The hardware appears intact and well positioned. Chronic superior endplate compression deformity at T11 is unchanged. No acute osseous complications are identified. There is mild atelectasis at both lung bases. IMPRESSION: No demonstrated acute findings status post recent midthoracic spinal decompression and fusion. Electronically Signed   By: Carey Bullocks M.D.   On: 01/31/2023 16:27   DG Chest [facility] 1 View  Result  Date: 01/30/2023 CLINICAL DATA:  Cough, shortness of breath EXAM: PORTABLE CHEST 1 VIEW COMPARISON:  01/24/2023 FINDINGS: Cardiomegaly. Diffuse bilateral interstitial pulmonary opacity and trace pleural effusions. Interval thoracic fusion. IMPRESSION: Cardiomegaly with diffuse bilateral interstitial pulmonary opacity and trace pleural effusions, most consistent with edema. Electronically Signed   By: Jearld Lesch M.D.   On: 01/30/2023 14:07   DG Thoracic Spine 2 View  Result Date: 01/28/2023 CLINICAL DATA:  Elective surgery. T4 through T9 fusion of biopsy for lesion with intra 3D imaging. EXAM: THORACIC SPINE 2 VIEWS COMPARISON:  Preoperative imaging. FINDINGS: Intraoperative 3D imaging of the thoracic spine obtained. Posterior rod with pedicle screws at multiple levels. Known paravertebral mass better assessed on preoperative imaging. No fluoroscopic spot views provided. Fluoroscopy time 1 minutes 30 seconds. Dose is 97.045 mGy IMPRESSION: Intraoperative 3D imaging of the thoracic spine. Electronically Signed   By: Narda Rutherford M.D.   On: 01/28/2023 16:28   DG C-Arm 1-60 Min-No  Report  Result Date: 01/28/2023 Fluoroscopy was utilized by the requesting physician.  No radiographic interpretation.   DG C-Arm 1-60 Min-No Report  Result Date: 01/28/2023 Fluoroscopy was utilized by the requesting physician.  No radiographic interpretation.   DG C-Arm 1-60 Min-No Report  Result Date: 01/28/2023 Fluoroscopy was utilized by the requesting physician.  No radiographic interpretation.   DG C-Arm 1-60 Min-No Report  Result Date: 01/28/2023 Fluoroscopy was utilized by the requesting physician.  No radiographic interpretation.   MR THORACIC SPINE W WO CONTRAST  Result Date: 01/24/2023 CLINICAL DATA:  Soft tissue mass with bony destruction involving the left seventh rib and T6 and T7 vertebral bodies. EXAM: MRI CERVICAL AND THORACIC SPINE WITHOUT AND WITH CONTRAST TECHNIQUE: Multiplanar and multiecho pulse sequences of the cervical spine, to include the craniocervical junction and cervicothoracic junction, and the thoracic spine, were obtained without and with intravenous contrast. CONTRAST:  7mL GADAVIST GADOBUTROL 1 MMOL/ML IV SOLN COMPARISON:  Same day CTA chest, cervical and thoracic spine MRI 07/20/2020 FINDINGS: MRI CERVICAL SPINE FINDINGS Alignment: There is trace retrolisthesis of C3 on C4 and trace anterolisthesis of C5 on C6. Vertebrae: Vertebral body heights are preserved. Background marrow signal is normal. A T1 hypointense/T2 hyperintense lesion in the clivus is unchanged since 2022, favored benign. There is no suspicious marrow signal abnormality or marrow edema in the cervical spine. There is no abnormal marrow enhancement in the cervical spine. There is a 6 mm enhancing lesion in the right occipital calvarium which was not seen on the prior cervical spine MRI from 2022; however, this area may not has been included within the field of view. Cord: Normal in signal and morphology. Posterior Fossa, vertebral arteries, paraspinal tissues: The imaged posterior fossa is  unremarkable. The vertebral artery flow voids are normal. The paraspinal soft tissues are unremarkable. Disc levels: There is overall mild degenerative change in the cervical spine without significant spinal canal or neural foraminal stenosis. MRI THORACIC SPINE FINDINGS Alignment:  Normal. Vertebrae: Background marrow signal is normal. There is infiltrative T1 hypointensity occupying most of the T6 and T7 vertebral bodies extending into the left pedicles and facets. There is a bulky soft tissue mass in the adjacent left paraspinal/subpleural soft tissues with destruction of the seventh rib. The bulk of the soft tissue component measures up to proximally 7.2 cm x 3.4 cm x 2.7 cm. There is extension into the T6-T7 and T7-T8 neural foramina as well as epidural tumor along the ventral and left dorsal lateral aspects of the spinal canal  resulting in moderate spinal canal stenosis with effacement of the thecal sac but no frank cord compression or cord edema. There is mild loss of vertebral body height at both levels consistent with associated pathologic fractures. There is compression deformity of the T11 vertebral body with up to approximately 30% loss of vertebral body height anteriorly and minimal bony retropulsion. There is faint edema along the superior endplate consistent with acute to subacute chronicity. This fracture may be pathologic. There are probable additional lesions involving multiple additional ribs (for example 25-21, 25-24). The above findings are new since the MRI from 2022. Cord:  There is no cord signal abnormality or abnormal enhancement. Paraspinal and other soft tissues: Unremarkable, aside from the bulky paraspinal tumor described above. Disc levels: There is a small disc protrusion at T10-T11 without significant spinal canal or neural foraminal stenosis. Otherwise, there is overall minimal background degenerative change without significant spinal canal or neural foraminal stenosis. IMPRESSION:  1. Large soft tissue mass in the left paraspinal soft tissues at T6-T7 with destruction of the seventh rib and invasion of the T6 and T7 vertebral bodies with involvement of the left posterior elements as well as epidural tumor in the spinal canal resulting in moderate spinal canal stenosis without frank cord compression or cord edema. 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 2. Acute to subacute compression fracture of the T11 vertebral body with up to approximately 30% loss of vertebral body height and minimal bony retropulsion, possibly pathologic. 3. Small enhancing lesion in the right occipital calvarium, not definitely present on the prior cervical spine from 2022 (though possibly not included within the field of view on that study), suspicious for an additional metastatic or myeloma lesion. 4. Additional probable lesions involving multiple additional ribs. 5. No acute or suspicious finding in the cervical spine. Electronically Signed   By: Lesia Hausen M.D.   On: 01/24/2023 14:33   MR Cervical Spine W and Wo Contrast  Result Date: 01/24/2023 CLINICAL DATA:  Soft tissue mass with bony destruction involving the left seventh rib and T6 and T7 vertebral bodies. EXAM: MRI CERVICAL AND THORACIC SPINE WITHOUT AND WITH CONTRAST TECHNIQUE: Multiplanar and multiecho pulse sequences of the cervical spine, to include the craniocervical junction and cervicothoracic junction, and the thoracic spine, were obtained without and with intravenous contrast. CONTRAST:  7mL GADAVIST GADOBUTROL 1 MMOL/ML IV SOLN COMPARISON:  Same day CTA chest, cervical and thoracic spine MRI 07/20/2020 FINDINGS: MRI CERVICAL SPINE FINDINGS Alignment: There is trace retrolisthesis of C3 on C4 and trace anterolisthesis of C5 on C6. Vertebrae: Vertebral body heights are preserved. Background marrow signal is normal. A T1 hypointense/T2 hyperintense lesion in the clivus is unchanged since  2022, favored benign. There is no suspicious marrow signal abnormality or marrow edema in the cervical spine. There is no abnormal marrow enhancement in the cervical spine. There is a 6 mm enhancing lesion in the right occipital calvarium which was not seen on the prior cervical spine MRI from 2022; however, this area may not has been included within the field of view. Cord: Normal in signal and morphology. Posterior Fossa, vertebral arteries, paraspinal tissues: The imaged posterior fossa is unremarkable. The vertebral artery flow voids are normal. The paraspinal soft tissues are unremarkable. Disc levels: There is overall mild degenerative change in the cervical spine without significant spinal canal or neural foraminal stenosis. MRI THORACIC SPINE FINDINGS Alignment:  Normal. Vertebrae: Background marrow signal is normal. There is infiltrative T1 hypointensity occupying most  of the T6 and T7 vertebral bodies extending into the left pedicles and facets. There is a bulky soft tissue mass in the adjacent left paraspinal/subpleural soft tissues with destruction of the seventh rib. The bulk of the soft tissue component measures up to proximally 7.2 cm x 3.4 cm x 2.7 cm. 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 There is mild loss of vertebral body height at both levels consistent with associated pathologic fractures. There is compression deformity of the T11 vertebral body with up to approximately 30% loss of vertebral body height anteriorly and minimal bony retropulsion. There is faint edema along the superior endplate consistent with acute to subacute chronicity. This fracture may be pathologic. There are probable additional lesions involving multiple additional ribs (for example  25-21, 25-24). The above findings are new since the MRI from 2022. Cord:  There is no cord signal abnormality or abnormal enhancement. Paraspinal and other soft tissues: Unremarkable, aside from the bulky paraspinal tumor described above. Disc levels: There is a small disc protrusion at T10-T11 without significant spinal canal or neural foraminal stenosis. Otherwise, there is overall minimal background degenerative change without significant spinal canal or neural foraminal stenosis. IMPRESSION: 1. Large soft tissue mass in the left paraspinal soft tissues at T6-T7 with destruction of the seventh rib and invasion of the T6 and T7 vertebral bodies with involvement of the left posterior elements as well as epidural tumor in the spinal canal resulting in moderate spinal canal stenosis without frank cord compression or cord edema. 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 2. Acute to subacute compression fracture of the T11 vertebral body with up to approximately 30% loss of vertebral body height and minimal bony retropulsion, possibly pathologic. 3. Small enhancing lesion in the right occipital calvarium, not definitely present on the prior cervical spine from 2022 (though possibly not included within the field of view on that study), suspicious for an additional metastatic or myeloma lesion. 4. Additional probable lesions involving multiple additional ribs. 5. No acute or suspicious finding in the cervical spine. Electronically Signed   By: Lesia Hausen M.D.   On: 01/24/2023 14:33   CT Angio Chest PE W and/or Wo Contrast  Result Date: 01/24/2023 CLINICAL DATA:  Chest pain and shortness of breath for a month. EXAM: CT ANGIOGRAPHY CHEST WITH CONTRAST TECHNIQUE: Multidetector CT imaging of the chest was performed using the standard protocol during bolus administration of intravenous contrast. Multiplanar CT image reconstructions and MIPs were obtained to evaluate  the vascular anatomy. RADIATION DOSE REDUCTION: This exam was performed according to the departmental dose-optimization program which includes automated exposure control, adjustment of the mA and/or kV according to patient size and/or use of iterative reconstruction technique. CONTRAST:  75mL OMNIPAQUE IOHEXOL 350 MG/ML SOLN COMPARISON:  X-ray 01/24/2023 and older.  Previous CT January 2017 FINDINGS: Cardiovascular: The thoracic aorta has a normal course and caliber with minimal calcified plaque. There is a bovine type aortic arch, normal variant. Coronary artery calcifications are seen. Please correlate for other coronary risk factors. Heart is nonenlarged. There is slight wall thickening of the left ventricle. Prominent fat along the intra-atrial septum. Pulmonary arteries show some slight enlargement centrally. Please correlate for any evidence of pulmonary artery hypertension. There is heterogeneous enhancement  of the pulmonary arterial tree limiting evaluation for emboli. No obvious large and central embolus although several areas are nondiagnostic. Mediastinum/Nodes: Surgical changes identified with a moderate hiatal hernia. Small thyroid gland. No specific abnormal lymph node enlargement identified in the axillary region, hilum or mediastinum. There are some small nodes identified in the posterior mediastinum, posterior to the descending thoracic aorta such as series 4, image 86 but not pathologic by size criteria. Lungs/Pleura: There is some linear opacity lung bases likely scar or atelectasis. No consolidation. Breathing motion identified. No pleural effusion or pneumothorax. 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 Benign hepatic cysts as well. Musculoskeletal: There is large destructive mass involving the posterior aspect of the left seventh rib with extension to involve the central canal of the spine in the associated vertebral bodies of T6 and  T7. Cord compression is possible. This mass is estimated in diameter on series 4, image 65 at 8.5 by 4.3 cm. Slight extension along the extrapleural space in the posterior mediastinum as well. There is question of some subtle other lucent lesions identified along the spine but indeterminate. Critical Value/emergent results were called by telephone at the time of interpretation on 01/24/2023 at 8:41 am to provider [facility] , who verbally acknowledged these results. Review of the MIP images confirms the above findings. IMPRESSION: Aggressive soft tissue mass with bone destruction involving the left seventh rib as well as the T6 and T7 vertebral bodies and central canal. A neoplastic process is possible. There also is a possibility of significant cord compression with canal encroachment along the spine. Recommend further evaluation with spine MRI with and without contrast and a whole-body bone scan. There appears to be some other subtle lucent bone lesions along the spine and sternum. Please correlate for any history of known malignancy including such processes as myeloma or other. Poor opacification of the pulmonary arterial tree limiting evaluation. Several areas are non diagnostic for pulmonary emboli. No obvious large and central embolus. Aortic Atherosclerosis (ICD10-I70.0). Electronically Signed   By: Karen Kays M.D.   On: 01/24/2023 11:47   DG Chest 2 View  Result Date: 01/24/2023 CLINICAL DATA:  Chest pain EXAM: CHEST - 2 VIEW COMPARISON:  Chest x-ray dated January 07, 2023 FINDINGS: The heart size and mediastinal contours are within normal limits. Small hiatal hernia. Both lungs are clear. Pleural-based nodular opacity of the left posterior hemithorax. The visualized skeletal structures are unremarkable. IMPRESSION: Pleural-based nodular opacity of the left posterior hemithorax. Recommend further evaluation with contrast enhanced chest CT. Electronically Signed   By: Allegra Lai M.D.   On:  01/24/2023 09:38     Assessment and plan- Patient is a 57 y.o. female ***   Visit Diagnosis 1. Multiple myeloma not having achieved remission (HCC)   2. Palliative care encounter   3. Hyperuricemia   4. Constipation, unspecified constipation type      Dr. Owens Shark, MD, MPH [facility] [phone] 02/14/2023 4:27 PM

## 2023-02-14 NOTE — Progress Notes (Signed)
[facility]  ROUNDING NOTE   Subjective:   Patient seen resting in bed States she did not rest well overnight Appetite appropriate  Creatinine remains 3.97 (4.63) (5.09) Sodium 132 Urine output in past 24 hours   Objective:  Vital signs in last 24 hours:  Temp:  [97.8 F (36.6 C)-98.3 F (36.8 C)] 97.8 F (36.6 C) (08/30 0730) Pulse Rate:  [70-77] 70 (08/30 0730) Resp:  [17-18] 18 (08/30 0730) BP: (130-153)/(71-95) 145/95 (08/30 0730) SpO2:  [94 %-100 %] 100 % (08/30 0730)  Weight change:  Filed Weights   02/10/23 1805 02/12/23 1053  Weight: 81.6 kg 88.5 kg    Intake/Output: I/O last 3 completed shifts: In: 1070 [P.O.:720; Blood:350] Out: 6250 [Urine:6250]   Intake/Output this shift:  No intake/output data recorded.  Physical Exam: General: NAD  Head: Normocephalic, atraumatic. Moist oral mucosal membranes  Eyes: Anicteric  Lungs:  Clear to auscultation, normal effort  Heart: Regular rate and rhythm  Abdomen:  Soft, nontender  Extremities:  No peripheral edema.  Neurologic: Alert, moving all four extremities  Skin: No lesions  Access: None    Basic Metabolic Panel: Recent Labs  Lab 02/10/23 1315 02/10/23 1500 02/10/23 2009 02/11/23 0457 02/12/23 0155 02/13/23 0514 02/14/23 0322  NA  --    < > 125* 126* 126* 129* 132*  K  --   --  5.3* 5.4* 5.2* 5.4* 5.3*  CL  --   --  95* 98 98 99 102  CO2  --   --  19* 18* 17* 17* 17*  GLUCOSE  --   --  82 92 201* 99 90  BUN  --   --  59* 59* 62* 69* 70*  CREATININE  --   --  5.34* 5.30* 5.09* 4.63* 3.97*  CALCIUM  --   --  7.8* 7.7* 7.9* 7.9* 8.3*  MG  --   --   --   --   --  2.1 1.8  PHOS 6.4*  --   --  6.8*  --  6.0*  --    < > = values in this interval not displayed.    Liver Function Tests: Recent Labs  Lab 02/10/23 0906  AST 18  ALT 16  ALKPHOS 60  BILITOT 0.3  PROT 8.2*  ALBUMIN 2.1*   No results for input(s): "LIPASE", "AMYLASE" in the last 168 hours. No results for  input(s): "AMMONIA" in the last 168 hours.  CBC: Recent Labs  Lab 02/10/23 0906 02/10/23 2009 02/13/23 0514 02/14/23 0322  WBC 14.3*  --  12.2* 8.7  NEUTROABS 10.4*  --   --   --   HGB 8.4* 7.4* 7.0* 7.7*  HCT 25.2*  --  21.0* 23.3*  MCV 94.4  --  94.2 92.5  PLT 439*  --  424* 413*    Cardiac Enzymes: Recent Labs  Lab 02/10/23 1500  CKTOTAL 119    BNP: Invalid input(s): "POCBNP"  CBG: Recent Labs  Lab 02/12/23 1038  GLUCAP 100*    Microbiology: Results for orders placed or performed during the hospital encounter of 02/10/23  Surgical PCR screen     Status: None   Collection Time: 02/10/23  7:09 PM   Specimen: Nasal Mucosa; Nasal Swab  Result Value Ref Range Status   MRSA, PCR NEGATIVE NEGATIVE Final   Staphylococcus aureus NEGATIVE NEGATIVE Final    Comment: (NOTE) The Xpert SA Assay (FDA approved for NASAL specimens in patients 58 years of age and  older), is one component of a comprehensive surveillance program. It is not intended to diagnose infection nor to guide or monitor treatment. Performed at [facility], [address] [place], [place] [address]     Coagulation Studies: No results for input(s): "LABPROT", "INR" in the last 72 hours.  Urinalysis: No results for input(s): "COLORURINE", "LABSPEC", "PHURINE", "GLUCOSEU", "HGBUR", "BILIRUBINUR", "KETONESUR", "PROTEINUR", "UROBILINOGEN", "NITRITE", "LEUKOCYTESUR" in the last 72 hours.  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 EXAM: NUCLEAR MEDICINE PET WHOLE BODY TECHNIQUE: 10.1 mCi F-18 FDG was injected intravenously. Full-ring PET imaging was performed from the head to foot after the radiotracer. CT data was obtained and used for attenuation correction and anatomic localization. Fasting  blood glucose: 100 mg/dl COMPARISON:  CT chest 60/45/4098 FINDINGS: Mediastinal blood pool activity: SUV max HEAD/NECK: No hypermetabolic activity in the scalp. No hypermetabolic cervical lymph nodes. Incidental CT findings: none CHEST: At the LEFT paraspinal resection site (T6-T7), there is residual fluid and soft tissue density tissue measuring 6.1 x 3.7 cm. This tissue has no significant metabolic activity. Activity is equal to background blood pool activity. Posterior fusion at this level. No hypermetabolic tissue within the thorax to suggest malignancy within the bones or soft tissue. There are several small nodules in the LEFT lung apex (image 54/6) without metabolic activity. These are new from comparison CT 01/24/2023 and therefore favored foci of infection or inflammation. Small bilateral pleural effusions are present greater on the LEFT. Incidental CT findings: none ABDOMEN/PELVIS: There is moderate metabolic activity through the GE junction. There is postsurgical anatomy of the proximal stomach. No abnormal metabolic activity liver. Scattered physiologic activity in the bowel. No hypermetabolic lymph nodes in the abdomen pelvis. Incidental CT findings: Foley catheter in bladder. SKELETON: Hypermetabolic lesion in the L4 vertebral body with SUV max equal 5.4. associated lytic lesion measuring 19 mm on image 121/series 6. EXTREMITIES: No abnormal hypermetabolic activity in the lower extremities. Incidental CT findings: No sclerotic or lytic lesions on CT imaging. IMPRESSION: 1. Solitary hypermetabolic lytic lesion in the L4 vertebral body consistent multiple myeloma. Metabolic activity is moderate. 2. Residual soft tissue density at the LEFT paraspinal resection site (T6-T7) with no significant metabolic activity. Favor benign postsurgical tissue. 3. Small bilateral pleural effusions greater on the LEFT. 4. Small nodules in the LEFT lung apex without metabolic activity are favored foci of infection or  inflammation. 5. Moderate metabolic activity through the GE junction is favored physiologic or inflammatory. Electronically Signed   By: Genevive Bi M.D.   On: 02/12/2023 13:47     Medications:    sodium chloride     sodium chloride 150 mL/hr at 02/14/23 0825   famotidine (PEPCID) IV      allopurinol  100 mg Oral Daily   bisacodyl  10 mg Oral Daily   Chlorhexidine Gluconate Cloth  6 each Topical Daily   clonazePAM  0.5 mg Oral BID   docusate sodium  100 mg Oral BID   escitalopram  10 mg Oral Daily   fentaNYL  1 patch Transdermal Q72H   gabapentin  100 mg Oral BID   heparin  5,000 Units Subcutaneous Q12H   levothyroxine  250 mcg Oral QAC breakfast   loratadine  10 mg Oral Daily   mometasone-formoterol  2 puff Inhalation BID   mupirocin ointment  1 Application Nasal BID   pantoprazole  40 mg Oral Daily   polyethylene glycol  17 g Oral Daily   sodium bicarbonate  650 mg Oral TID   umeclidinium bromide  1 puff Inhalation Daily   sodium chloride, albuterol, albuterol, alteplase, cyclobenzaprine, diphenhydrAMINE, EPINEPHrine, famotidine (PEPCID) IV, heparin lock flush, heparin lock flush, HYDROmorphone, methylPREDNISolone sodium succinate, sodium chloride flush, sodium chloride flush, zolpidem  Assessment/ Plan:  Paula Garrett is a 57 y.o.  female with past medical history hypertension, depression, anxiety, tobacco, and recent diagnosis of multiple myeloma, who was admitted to [facility] on 02/10/2023 for AKI (acute kidney injury) (HCC) [N17.9]   Acute kidney injury appears to multifactorial. Admits to NSAID use and recent diagnosis of multiple myeloma. Differential diagnosis includes light chain nephropathy. Renal ultrasound negative for hydronephrosis.  Creatinine continues to improve with treatment of underlying cause. Continues to have adequate urination, daily goal 3L. No acute indication for renal biopsy or dialysis. Will continue to monitor.    Lab Results  Component Value  Date   CREATININE 3.97 (H) 02/14/2023   CREATININE 4.63 (H) 02/13/2023   CREATININE 5.09 (H) 02/12/2023    Intake/Output Summary (Last 24 hours) at 02/14/2023 1148 Last data filed at 02/14/2023 0413 Gross per 24 hour  Intake 830 ml  Output 4150 ml  Net -3320 ml   2. Hyperkalemia, likely secondary to kidney injury. Potassium 5.3. Continue oral Sodium bicarbonate 650mg  ordered.    3. Acute metabolic acidosis, S bicarb 19 on admission.  Remains 17. Oral supplementation ordered as above.    4. Multiple myeloma diagnosed with bone marrow biopsy on 02/05/23. Following Dr Smith Robert outpatient. Chemotherapy began on 02/12/23    LOS: 4   8/30/202411:48 AM

## 2023-02-15 ENCOUNTER — Encounter: Payer: Self-pay | Admitting: Oncology

## 2023-02-15 DIAGNOSIS — N179 Acute kidney failure, unspecified: Secondary | ICD-10-CM | POA: Diagnosis not present

## 2023-02-15 LAB — MAGNESIUM: Magnesium: 1.7 mg/dL (ref 1.7–2.4)

## 2023-02-15 LAB — BASIC METABOLIC PANEL
Anion gap: 8 (ref 5–15)
BUN: 66 mg/dL — ABNORMAL HIGH (ref 6–20)
CO2: 20 mmol/L — ABNORMAL LOW (ref 22–32)
Calcium: 8.2 mg/dL — ABNORMAL LOW (ref 8.9–10.3)
Chloride: 108 mmol/L (ref 98–111)
Creatinine, Ser: 3.37 mg/dL — ABNORMAL HIGH (ref 0.44–1.00)
GFR, Estimated: 15 mL/min — ABNORMAL LOW (ref >60)
Glucose, Bld: 111 mg/dL — ABNORMAL HIGH (ref 70–99)
Potassium: 4.6 mmol/L (ref 3.5–5.1)
Sodium: 136 mmol/L (ref 135–145)

## 2023-02-15 LAB — CBC
HCT: 23.7 % — ABNORMAL LOW (ref 36.0–46.0)
Hemoglobin: 7.8 g/dL — ABNORMAL LOW (ref 12.0–15.0)
MCH: 31.3 pg (ref 26.0–34.0)
MCHC: 32.9 g/dL (ref 30.0–36.0)
MCV: 95.2 fL (ref 80.0–100.0)
Platelets: 438 10*3/uL — ABNORMAL HIGH (ref 150–400)
RBC: 2.49 MIL/uL — ABNORMAL LOW (ref 3.87–5.11)
RDW: 14.9 % (ref 11.5–15.5)
WBC: 7.8 10*3/uL (ref 4.0–10.5)
nRBC: 0 % (ref 0.0–0.2)

## 2023-02-15 MED ORDER — FENTANYL 25 MCG/HR TD PT72
1.0000 | MEDICATED_PATCH | TRANSDERMAL | Status: DC
  Administered 2023-02-15 – 2023-02-18 (×2): 1 via TRANSDERMAL
  Filled 2023-02-15 (×2): qty 1

## 2023-02-15 NOTE — Progress Notes (Signed)
PROGRESS NOTE    Paula Garrett  ZOX:[id number] DOB: February 06, 1966 DOA: 02/10/2023 PCP: Smitty Cords, DO  105A/105A-AA  LOS: 5 days   Brief hospital course:   Assessment & Plan: Paula Garrett is a 57 y.o. female with multiple medical problems including hypertension, depression, anxiety, history of tobacco abuse, and multiple myeloma, who was hospitalized 01/24/2023 to 02/06/2023 with chest pain and shortness of breath and was found to have a large soft tissue mass in the left T6-T7 paraspinal area with destruction of seventh rib and invasion of T6 and T7 vertebral bodies.  Patient underwent surgery on 8/13 with postop period complicated by pain.  Pathology positive for multiple myeloma.  Patient now readmitted 02/10/2023 with AKI      AKI in the setting of multiple myeloma -- baseline creatinine .93 -- patient came in with creatinine of 5.27--- IV fluids-- 5.3 -- patient making good urine. -- Nephrology consultation with Dr. Wynelle Link.  -- Renal ultrasound no obstructive neuropathy Plan: --reduce MIVF to 100 ml/hr, per Dr. Smith Robert   Hyperkalemia -- no EKG changes. Patient received hyperkalemia cocktail in the ER. --lokelma 10g x2 --management per nephro  Hyponatremia -- suspected due to renal failure   Multiple myeloma -- patient seen by Dr. Smith Robert -- given significant kidney issues from multiple myeloma patient to be started on inpatient chemotherapy  -- Follow further recommendations per Dr. 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 Katrinka Blazing.   --Patient had a posterior segmental instrumentation T4-T9, posterior lateral arthrodesis from T4-T9, thoracic laminectomy at T6 and T7.   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  --pain management (switched from home oxycontin to Fentanyl patch, switched from home oxycodone PRN to oral dialudid PRN) --increase Fentanyl patch from 12 to 25    Chronic back pain --pain management (switched from home oxycontin to Fentanyl patch, switched from home oxycodone PRN to oral dialudid PRN) --cont gabapentin --increase Fentanyl patch from 12 to 25   Asthma COPD -stable --cont daily bronchodilators   Anxiety depression -- continue Lexapro and Klonopin   Hypothyroidism -- continue Synthroid  Anemia --s/p 1u pRBC (irradiate)  --monitor and transfuse to keep Hgb >7  Mild encephalopathy, ruled out   DVT prophylaxis: Heparin SQ Code Status: Full code  Family Communication:  Level of care: Telemetry Medical Dispo:   The patient is from: home Anticipated d/c is to: home Anticipated d/c date is: > 3 days   Subjective and Interval History:  Pt started crying as soon as I asked her how she was doing.  Pt complained of worsening pain, in her chest.     Objective: Vitals:   02/14/23 0730 02/14/23 2017 02/15/23 0414 02/15/23 0745  BP: (!) 145/95 (!) 148/79 (!) 150/81 126/74  Pulse: 70 76 79 79  Resp: 18 20 16 18   Temp: 97.8 F (36.6 C) 98.2 F (36.8 C) 97.8 F (36.6 C) 98.3 F (36.8 C)  TempSrc: Oral Oral Oral Oral  SpO2: 100% 96% 91% 99%  Weight:      Height:        Intake/Output Summary (Last 24 hours) at 02/15/2023 1957 Last data filed at 02/15/2023 1000 Gross per 24 hour  Intake 120 ml  Output --  Net 120 ml   Filed Weights   02/10/23 1805 02/12/23 1053  Weight: 81.6 kg 88.5 kg    Examination:   Constitutional: NAD, AAOx3 HEENT: conjunctivae and  lids normal, EOMI CV: No cyanosis.   RESP: normal respiratory effort, on RA Neuro: II - XII grossly intact.   Psych: labile mood and affect.     Data Reviewed: I have personally reviewed labs and imaging studies  Time spent: 35 minutes  Darlin Priestly, MD [facility] If 7PM-7AM, please contact night-coverage 02/15/2023, 7:57 PM

## 2023-02-15 NOTE — Progress Notes (Signed)
Chemo RN presented to pt room. Pt verified with 2 separate identifiers. Role of Chemo RN explained. Consent for this admission already obtained. Treatment completed without issue. Patient with no complaints or concerns. Questions answered to patient's satisfaction. Primary RN made aware that treatment complete. Chemo RN left patient in stable condition with no changes since arrival.

## 2023-02-15 NOTE — Plan of Care (Signed)

## 2023-02-15 NOTE — Progress Notes (Signed)
[facility]  ROUNDING NOTE   Subjective:   Patient seen sitting up Alert and oriented Appetite remains appropriate Denies nausea   Creatinine 3.37 (3.97) (4.63) (5.09) Sodium 136 Urine output in past 24 hours   Objective:  Vital signs in last 24 hours:  Temp:  [97.8 F (36.6 C)-98.3 F (36.8 C)] 98.3 F (36.8 C) (08/31 0745) Pulse Rate:  [76-79] 79 (08/31 0745) Resp:  [16-20] 18 (08/31 0745) BP: (126-150)/(74-81) 126/74 (08/31 0745) SpO2:  [91 %-99 %] 99 % (08/31 0745)  Weight change:  Filed Weights   02/10/23 1805 02/12/23 1053  Weight: 81.6 kg 88.5 kg    Intake/Output: I/O last 3 completed shifts: In: 830 [P.O.:480; Blood:350] Out: 4450 [Urine:4450]   Intake/Output this shift:  Total I/O In: 120 [P.O.:120] Out: -   Physical Exam: General: NAD  Head: Normocephalic, atraumatic. Moist oral mucosal membranes  Eyes: Anicteric  Lungs:  Clear to auscultation, normal effort  Heart: Regular rate and rhythm  Abdomen:  Soft, nontender  Extremities:  No peripheral edema.  Neurologic: Alert, moving all four extremities  Skin: No lesions  Access: None    Basic Metabolic Panel: Recent Labs  Lab 02/10/23 1315 02/10/23 1500 02/11/23 0457 02/12/23 0155 02/13/23 0514 02/14/23 0322 02/15/23 0353  NA  --    < > 126* 126* 129* 132* 136  K  --    < > 5.4* 5.2* 5.4* 5.3* 4.6  CL  --    < > 98 98 99 102 108  CO2  --    < > 18* 17* 17* 17* 20*  GLUCOSE  --    < > 92 201* 99 90 111*  BUN  --    < > 59* 62* 69* 70* 66*  CREATININE  --    < > 5.30* 5.09* 4.63* 3.97* 3.37*  CALCIUM  --    < > 7.7* 7.9* 7.9* 8.3* 8.2*  MG  --   --   --   --  2.1 1.8 1.7  PHOS 6.4*  --  6.8*  --  6.0*  --   --    < > = values in this interval not displayed.    Liver Function Tests: Recent Labs  Lab 02/10/23 0906  AST 18  ALT 16  ALKPHOS 60  BILITOT 0.3  PROT 8.2*  ALBUMIN 2.1*   No results for input(s): "LIPASE", "AMYLASE" in the last 168 hours. No  results for input(s): "AMMONIA" in the last 168 hours.  CBC: Recent Labs  Lab 02/10/23 0906 02/10/23 2009 02/13/23 0514 02/14/23 0322 02/15/23 0353  WBC 14.3*  --  12.2* 8.7 7.8  NEUTROABS 10.4*  --   --   --   --   HGB 8.4* 7.4* 7.0* 7.7* 7.8*  HCT 25.2*  --  21.0* 23.3* 23.7*  MCV 94.4  --  94.2 92.5 95.2  PLT 439*  --  424* 413* 438*    Cardiac Enzymes: Recent Labs  Lab 02/10/23 1500  CKTOTAL 119    BNP: Invalid input(s): "POCBNP"  CBG: Recent Labs  Lab 02/12/23 1038  GLUCAP 100*    Microbiology: Results for orders placed or performed during the hospital encounter of 02/10/23  Surgical PCR screen     Status: None   Collection Time: 02/10/23  7:09 PM   Specimen: Nasal Mucosa; Nasal Swab  Result Value Ref Range Status   MRSA, PCR NEGATIVE NEGATIVE Final   Staphylococcus aureus NEGATIVE NEGATIVE Final  Comment: (NOTE) The Xpert SA Assay (FDA approved for NASAL specimens in patients 58 years of age and older), is one component of a comprehensive surveillance program. It is not intended to diagnose infection nor to guide or monitor treatment. Performed at [facility], [address] [place], [place] [address]     Coagulation Studies: No results for input(s): "LABPROT", "INR" in the last 72 hours.  Urinalysis: No results for input(s): "COLORURINE", "LABSPEC", "PHURINE", "GLUCOSEU", "HGBUR", "BILIRUBINUR", "KETONESUR", "PROTEINUR", "UROBILINOGEN", "NITRITE", "LEUKOCYTESUR" in the last 72 hours.  Invalid input(s): "APPERANCEUR"     Imaging: No results found.   Medications:    sodium chloride     sodium chloride 100 mL/hr at 02/15/23 1352   sodium chloride     sodium chloride     famotidine (PEPCID) IV     famotidine (PEPCID) IV      allopurinol  100 mg Oral Daily   bisacodyl  10 mg Oral Daily   Chlorhexidine Gluconate Cloth  6 each Topical Daily   clonazePAM  0.5 mg Oral BID   docusate sodium  100 mg Oral BID   escitalopram  10  mg Oral Daily   fentaNYL  1 patch Transdermal Q72H   gabapentin  100 mg Oral BID   heparin  5,000 Units Subcutaneous Q12H   levothyroxine  250 mcg Oral QAC breakfast   loratadine  10 mg Oral Daily   mometasone-formoterol  2 puff Inhalation BID   mupirocin ointment  1 Application Nasal BID   pantoprazole  40 mg Oral Daily   polyethylene glycol  17 g Oral Daily   sodium bicarbonate  650 mg Oral TID   umeclidinium bromide  1 puff Inhalation Daily   sodium chloride, sodium chloride, albuterol, albuterol, albuterol, alteplase, alteplase, cyclobenzaprine, diphenhydrAMINE, diphenhydrAMINE, EPINEPHrine, EPINEPHrine, famotidine (PEPCID) IV, famotidine (PEPCID) IV, heparin lock flush, heparin lock flush, heparin lock flush, heparin lock flush, HYDROmorphone, methylPREDNISolone sodium succinate, methylPREDNISolone sodium succinate, sodium chloride flush, sodium chloride flush, sodium chloride flush, sodium chloride flush, zolpidem  Assessment/ Plan:  [name] is a 57 y.o.  female with past medical history hypertension, depression, anxiety, tobacco, and recent diagnosis of multiple myeloma, who was admitted to [facility] on 02/10/2023 for AKI (acute kidney injury) (HCC) [N17.9]   Acute kidney injury appears to multifactorial. Admits to NSAID use and recent diagnosis of multiple myeloma. Differential diagnosis includes light chain nephropathy. Renal ultrasound negative for hydronephrosis.  Renal function continues to improve. Good urine output noted. No acute indication for dialysis. Will continue to monitor renal indices.    Lab Results  Component Value Date   CREATININE 3.37 (H) 02/15/2023   CREATININE 3.97 (H) 02/14/2023   CREATININE 4.63 (H) 02/13/2023    Intake/Output Summary (Last 24 hours) at 02/15/2023 1411 Last data filed at 02/15/2023 1000 Gross per 24 hour  Intake 600 ml  Output --  Net 600 ml   2. Hyperkalemia, likely secondary to kidney injury. Potassium corrected to 4.6.  Continue oral Sodium bicarbonate 650mg  ordered.    3. Acute metabolic acidosis, S bicarb 19 on admission. Improved to 20. Oral supplementation ordered as above.    4. Multiple myeloma diagnosed with bone marrow biopsy on 02/05/23. Following Dr Smith Robert outpatient. Chemotherapy per oncology team.     LOS: 5   8/31/20242:11 PM

## 2023-02-16 ENCOUNTER — Other Ambulatory Visit: Payer: Self-pay | Admitting: Oncology

## 2023-02-16 ENCOUNTER — Inpatient Hospital Stay: Payer: Managed Care, Other (non HMO)

## 2023-02-16 DIAGNOSIS — N179 Acute kidney failure, unspecified: Secondary | ICD-10-CM | POA: Diagnosis not present

## 2023-02-16 LAB — BASIC METABOLIC PANEL
Anion gap: 10 (ref 5–15)
BUN: 54 mg/dL — ABNORMAL HIGH (ref 6–20)
CO2: 20 mmol/L — ABNORMAL LOW (ref 22–32)
Calcium: 8.8 mg/dL — ABNORMAL LOW (ref 8.9–10.3)
Chloride: 106 mmol/L (ref 98–111)
Creatinine, Ser: 2.77 mg/dL — ABNORMAL HIGH (ref 0.44–1.00)
GFR, Estimated: 19 mL/min — ABNORMAL LOW (ref >60)
Glucose, Bld: 96 mg/dL (ref 70–99)
Potassium: 4.5 mmol/L (ref 3.5–5.1)
Sodium: 136 mmol/L (ref 135–145)

## 2023-02-16 LAB — CBC
HCT: 25.8 % — ABNORMAL LOW (ref 36.0–46.0)
Hemoglobin: 8.4 g/dL — ABNORMAL LOW (ref 12.0–15.0)
MCH: 31.1 pg (ref 26.0–34.0)
MCHC: 32.6 g/dL (ref 30.0–36.0)
MCV: 95.6 fL (ref 80.0–100.0)
Platelets: 401 10*3/uL — ABNORMAL HIGH (ref 150–400)
RBC: 2.7 MIL/uL — ABNORMAL LOW (ref 3.87–5.11)
RDW: 14.9 % (ref 11.5–15.5)
WBC: 11.2 10*3/uL — ABNORMAL HIGH (ref 4.0–10.5)
nRBC: 0 % (ref 0.0–0.2)

## 2023-02-16 LAB — PROCALCITONIN: Procalcitonin: <0.1 ng/mL

## 2023-02-16 LAB — SARS CORONAVIRUS 2 BY RT PCR: SARS Coronavirus 2 by RT PCR: NEGATIVE

## 2023-02-16 LAB — MAGNESIUM: Magnesium: 1.5 mg/dL — ABNORMAL LOW (ref 1.7–2.4)

## 2023-02-16 MED ORDER — HYDROMORPHONE HCL 2 MG PO TABS: 2.0000 mg | ORAL_TABLET | ORAL | Status: DC | PRN

## 2023-02-16 MED ORDER — BUTALBITAL-APAP-CAFFEINE 50-325-40 MG PO TABS
2.0000 | ORAL_TABLET | Freq: Once | ORAL | Status: AC
  Administered 2023-02-16: 2 via ORAL
  Filled 2023-02-16: qty 2

## 2023-02-16 MED ORDER — HYDRALAZINE HCL 20 MG/ML IJ SOLN
10.0000 mg | Freq: Four times a day (QID) | INTRAMUSCULAR | Status: DC | PRN
  Administered 2023-02-16: 10 mg via INTRAVENOUS
  Filled 2023-02-16: qty 1

## 2023-02-16 MED ORDER — ACETAMINOPHEN 500 MG PO TABS
1000.0000 mg | ORAL_TABLET | Freq: Three times a day (TID) | ORAL | Status: DC | PRN
  Administered 2023-02-16: 1000 mg via ORAL
  Filled 2023-02-16: qty 2

## 2023-02-16 MED ORDER — AMLODIPINE BESYLATE 10 MG PO TABS
10.0000 mg | ORAL_TABLET | Freq: Every day | ORAL | Status: DC
  Administered 2023-02-16 – 2023-02-21 (×6): 10 mg via ORAL
  Filled 2023-02-16 (×6): qty 1

## 2023-02-16 MED ORDER — MAGNESIUM SULFATE 2 GM/50ML IV SOLN
2.0000 g | Freq: Once | INTRAVENOUS | Status: AC
  Administered 2023-02-16: 2 g via INTRAVENOUS
  Filled 2023-02-16: qty 50

## 2023-02-16 MED ORDER — BUTALBITAL-APAP-CAFFEINE 50-325-40 MG PO TABS
1.0000 | ORAL_TABLET | ORAL | Status: DC | PRN
  Administered 2023-02-17 – 2023-02-21 (×5): 1 via ORAL
  Filled 2023-02-16 (×6): qty 1

## 2023-02-16 MED ORDER — MAGNESIUM GLUCONATE 500 MG PO TABS
500.0000 mg | ORAL_TABLET | Freq: Every day | ORAL | Status: DC
  Administered 2023-02-17 – 2023-02-21 (×5): 500 mg via ORAL
  Filled 2023-02-16 (×5): qty 1

## 2023-02-16 MED ORDER — SODIUM CHLORIDE 0.9 % IV SOLN: INTRAVENOUS | Status: DC

## 2023-02-16 MED ORDER — HYDROMORPHONE HCL 2 MG PO TABS
1.0000 mg | ORAL_TABLET | ORAL | Status: DC | PRN
  Administered 2023-02-16 – 2023-02-18 (×12): 2 mg via ORAL
  Filled 2023-02-16 (×12): qty 1

## 2023-02-16 NOTE — Progress Notes (Addendum)
       CROSS COVER NOTE  NAME: Paula Garrett MRN: [id number] DOB : 1965/08/25    Concern as stated by nurse / staff   Pt admitted for hyponatremia and AKI with recent diagnosis of Multiple myeloma. Last sodium level 136 this morning. She had her chemo infusion yesterday. She has NS at 100 infusing currently. She is dyspneic with 28 respirations and sats 98% on RA. RT was in and giving her a neb, but feels like she might be getting fluid overload. She does have some crackles in the bases. She is drinking a lot and eating A LOT of ice chips. I think she's taking in a lot more than we know. Can we stop her fluids?      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 Patient was seen by nephrology earlier who advised to continue IV fluids She had a chest x-ray at 4:30 PM which was reviewed    Latest Ref Rng & Units 02/16/2023    4:42 AM 02/15/2023    3:53 AM 02/14/2023    3:22 AM  BMP  Glucose 70 - 99 mg/dL 96  295  90   BUN 6 - 20 mg/dL 54  66  70   Creatinine 0.44 - 1.00 mg/dL 6.21  3.08  6.57   Sodium 135 - 145 mmol/L 136  136  132   Potassium 3.5 - 5.1 mmol/L 4.5  4.6  5.3   Chloride 98 - 111 mmol/L 106  108  102   CO2 22 - 32 mmol/L 20  20  17    Calcium 8.9 - 10.3 mg/dL 8.8  8.2  8.3      Assessment and  Interventions   Assessment:  -Hyponatremia - resolved, 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  Had not chest x-ray several hours that did not show fluid overload Fioricet for headache

## 2023-02-16 NOTE — Progress Notes (Signed)
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 Hospital course complicated by AKI and electrolyte abnormalities.  Patient seen sitting up and walking around in the room. Alert and oriented. Appetite remains appropriate Denies nausea.  Very anxious this morning. Complains of pain in the upper abdomen that goes across the chest. Also wheezing this morning.   Objective:  Vital signs in last 24 hours:  Temp:  [98.3 F (36.8 C)-99.7 F (37.6 C)] 99.7 F (37.6 C) (09/01 0700) Pulse Rate:  [101-105] 102 (09/01 0700) Resp:  [20] 20 (09/01 0700) BP: (157-172)/(78-92) 172/92 (09/01 0700) SpO2:  [92 %-95 %] 95 % (09/01 0700)  Weight change:  Filed Weights   02/10/23 1805 02/12/23 1053  Weight: 81.6 kg 88.5 kg    Intake/Output: I/O last 3 completed shifts: In: 600 [P.O.:600] Out: -    Intake/Output this shift:  No intake/output data recorded.  Physical Exam: General: NAD  Head: Normocephalic, atraumatic. Moist oral mucosal membranes  Eyes: Anicteric  Lungs:  normal effort, bilateral mild diffuse wheezing  Heart: Regular rate and rhythm  Abdomen:  Soft, nontender  Extremities: About 1+ peripheral edema.  Neurologic: Alert, moving all four extremities  Skin: No lesions  Access: None    Basic Metabolic Panel: Recent Labs  Lab 02/10/23 1315 02/10/23 1500 02/11/23 0457 02/12/23 0155 02/13/23 0514 02/14/23 0322 02/15/23 0353 02/16/23 0442  NA  --    < > 126* 126* 129* 132* 136 136  K  --    < > 5.4* 5.2* 5.4* 5.3* 4.6 4.5  CL  --    < > 98 98 99 102 108 106  CO2  --    < > 18* 17* 17* 17* 20* 20*  GLUCOSE  --    < > 92 201* 99 90 111* 96  BUN  --    < > 59* 62* 69* 70* 66* 54*  CREATININE  --    < > 5.30* 5.09* 4.63* 3.97* 3.37* 2.77*  CALCIUM  --     < > 7.7* 7.9* 7.9* 8.3* 8.2* 8.8*  MG  --   --   --   --  2.1 1.8 1.7 1.5*  PHOS 6.4*  --  6.8*  --  6.0*  --   --   --    < > = values in this interval not displayed.    Liver Function Tests: Recent Labs  Lab 02/10/23 0906  AST 18  ALT 16  ALKPHOS 60  BILITOT 0.3  PROT 8.2*  ALBUMIN 2.1*   No results for input(s): "LIPASE", "AMYLASE" in the last 168 hours. No results for input(s): "AMMONIA" in the last 168 hours.  CBC: Recent Labs  Lab 02/10/23 0906 02/10/23 2009 02/13/23 0514 02/14/23 0322 02/15/23 0353 02/16/23 0442  WBC 14.3*  --  12.2* 8.7 7.8 11.2*  NEUTROABS 10.4*  --   --   --   --   --   HGB 8.4* 7.4* 7.0* 7.7* 7.8* 8.4*  HCT 25.2*  --  21.0* 23.3* 23.7* 25.8*  MCV 94.4  --  94.2 92.5 95.2 95.6  PLT 439*  --  424* 413* 438* 401*    Cardiac Enzymes: Recent Labs  Lab 02/10/23 1500  CKTOTAL 119    BNP: Invalid input(s): "POCBNP"  CBG: Recent Labs  Lab  02/12/23 1038  GLUCAP 100*    Microbiology: Results for orders placed or performed during the hospital encounter of 02/10/23  Surgical PCR screen     Status: None   Collection Time: 02/10/23  7:09 PM   Specimen: Nasal Mucosa; Nasal Swab  Result Value Ref Range Status   MRSA, PCR NEGATIVE NEGATIVE Final   Staphylococcus aureus NEGATIVE NEGATIVE Final    Comment: (NOTE) The Xpert SA Assay (FDA approved for NASAL specimens in patients 37 years of age and older), is one component of a comprehensive surveillance program. It is not intended to diagnose infection nor to guide or monitor treatment. Performed at [facility], [address] [place], [place] [address]     Coagulation Studies: No results for input(s): "LABPROT", "INR" in the last 72 hours.  Urinalysis: No results for input(s): "COLORURINE", "LABSPEC", "PHURINE", "GLUCOSEU", "HGBUR", "BILIRUBINUR", "KETONESUR", "PROTEINUR", "UROBILINOGEN", "NITRITE", "LEUKOCYTESUR" in the last 72 hours.  Invalid input(s):  "APPERANCEUR"     Imaging: No results found.   Medications:    sodium chloride     sodium chloride 100 mL/hr at 02/16/23 0005   sodium chloride     sodium chloride     famotidine (PEPCID) IV     famotidine (PEPCID) IV     magnesium sulfate bolus IVPB      allopurinol  100 mg Oral Daily   bisacodyl  10 mg Oral Daily   butalbital-acetaminophen-caffeine  2 tablet Oral Once   Chlorhexidine Gluconate Cloth  6 each Topical Daily   clonazePAM  0.5 mg Oral BID   docusate sodium  100 mg Oral BID   escitalopram  10 mg Oral Daily   fentaNYL  1 patch Transdermal Q72H   gabapentin  100 mg Oral BID   heparin  5,000 Units Subcutaneous Q12H   levothyroxine  250 mcg Oral QAC breakfast   loratadine  10 mg Oral Daily   mometasone-formoterol  2 puff Inhalation BID   pantoprazole  40 mg Oral Daily   polyethylene glycol  17 g Oral Daily   sodium bicarbonate  650 mg Oral TID   umeclidinium bromide  1 puff Inhalation Daily   sodium chloride, sodium chloride, albuterol, albuterol, albuterol, alteplase, alteplase, cyclobenzaprine, diphenhydrAMINE, diphenhydrAMINE, EPINEPHrine, EPINEPHrine, famotidine (PEPCID) IV, famotidine (PEPCID) IV, heparin lock flush, heparin lock flush, heparin lock flush, heparin lock flush, HYDROmorphone, methylPREDNISolone sodium succinate, methylPREDNISolone sodium succinate, sodium chloride flush, sodium chloride flush, sodium chloride flush, sodium chloride flush, zolpidem  Assessment/ Plan:  [name] is a 57 y.o.  female with past medical history hypertension, depression, anxiety, tobacco, and recent diagnosis of multiple myeloma, who was admitted to [facility] on 02/10/2023 for AKI (acute kidney injury) (HCC) [N17.9]   Acute kidney injury appears to multifactorial. Admits to NSAID use and recent diagnosis of multiple myeloma. Differential diagnosis includes myeloma nephropathy. Renal ultrasound negative for hydronephrosis.  Renal function continues to improve. Good  urine output noted. No acute indication for dialysis. Will continue to monitor renal indices.  Continue IV hydration.   Lab Results  Component Value Date   CREATININE 2.77 (H) 02/16/2023   CREATININE 3.37 (H) 02/15/2023   CREATININE 3.97 (H) 02/14/2023   No intake or output data in the 24 hours ending 02/16/23 1029  2. Hyperkalemia, likely secondary to kidney injury. Potassium corrected to 4.5.    3. Acute metabolic acidosis, S bicarb 19 on admission. Improved . Oral supplementation ordered as above.    4. Multiple myeloma diagnosed with bone  marrow biopsy on 02/05/23. Following Dr Smith Robert outpatient. Chemotherapy and pain control per oncology team.   5.  Hypomagnesemia.  Will order oral magnesium supplements.    LOS: 6 Zyon Grout 9/1/202410:29 AM

## 2023-02-16 NOTE — Progress Notes (Signed)
PROGRESS NOTE    LEVERTA Garrett  ZOX:[id number] DOB: 14-Sep-1965 DOA: 02/10/2023 PCP: Smitty Cords, DO  105A/105A-AA  LOS: 6 days   Brief hospital course:   Assessment & Plan: Paula Garrett is a 57 y.o. female with multiple medical problems including hypertension, depression, anxiety, history of tobacco abuse, and multiple myeloma, who was hospitalized 01/24/2023 to 02/06/2023 with chest pain and shortness of breath and was found to have a large soft tissue mass in the left T6-T7 paraspinal area with destruction of seventh rib and invasion of T6 and T7 vertebral bodies.  Patient underwent surgery on 8/13 with postop period complicated by pain.  Pathology positive for multiple myeloma.  Patient now readmitted 02/10/2023 with AKI      AKI in the setting of multiple myeloma -- baseline creatinine .93 -- patient came in with creatinine of 5.27--- IV fluids-- 5.3 -- patient making good urine. -- Nephrology consultation with Dr. Wynelle Garrett.  -- Renal ultrasound no obstructive neuropathy Plan: --cont MIVF@100    Hyperkalemia -- no EKG changes. Patient received hyperkalemia cocktail in the ER. --lokelma 10g x2 --management per nephro  Hyponatremia -- suspected due to renal failure   Multiple myeloma -- patient seen by Dr. Smith Paula -- given significant kidney issues from multiple myeloma patient to be started on inpatient chemotherapy  --completed 4 days of decadron -- Follow further recommendations per Dr. Smith Paula, velcade per schedule   Recent Mass of spinal cord [facility]) --Neurosurgical procedure done on 8/13 by Dr. Katrinka Garrett.   --Patient had a posterior segmental instrumentation T4-T9, posterior lateral arthrodesis from T4-T9, thoracic laminectomy at T6 and T7.   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  --pain management (switched from home oxycontin to Fentanyl patch, switched from home oxycodone PRN to oral dialudid PRN) --cont Fentanyl patch from 12 to 25   Chronic back  pain --pain management (switched from home oxycontin to Fentanyl patch, switched from home oxycodone PRN to oral dialudid PRN) --cont gabapentin --cont Fentanyl patch from 12 to 25   Asthma COPD -stable --cont daily bronchodilators   Anxiety depression on chronic benzo -- continue Lexapro and Klonopin   Hypothyroidism -- continue Synthroid  Anemia --s/p 1u pRBC (irradiate)  --monitor and transfuse to keep Hgb >7  Mild encephalopathy, ruled out   DVT prophylaxis: Heparin SQ Code Status: Full code  Family Communication:  Level of care: Telemetry Medical Dispo:   The patient is from: home Anticipated d/c is to: home Anticipated d/c date is: 2-3 days   Subjective and Interval History:  Pt complained about uncontrolled pain due to delay in getting her PRN pain med, but did acknowledge that she fell asleep when RN brought in the pain med the first time.   Objective: Vitals:   02/16/23 0700 02/16/23 1500 02/16/23 1648 02/16/23 1738  BP: (!) 172/92 (!) 191/93 (!) 177/82 (!) 123/59  Pulse: (!) 102 (!) 105 (!) 115 (!) 115  Resp: 20 20 20 18   Temp: 99.7 F (37.6 C) (!) 101.9 F (38.8 C)  98.6 F (37 C)  TempSrc: Oral Oral    SpO2: 95% 94% 98% 99%  Weight:      Height:       No intake or output data in the 24 hours ending 02/16/23 1906  Filed Weights   02/10/23 1805 02/12/23 1053  Weight: 81.6 kg 88.5 kg    Examination:   Constitutional: NAD, AAOx3 HEENT: conjunctivae and lids normal, EOMI CV: No cyanosis.   RESP: normal respiratory effort,  on RA Neuro: II - XII grossly intact.   Psych: emotional and anxious mood and affect.     Data Reviewed: I have personally reviewed labs and imaging studies  Time spent: 35 minutes  Paula Priestly, MD [facility] If 7PM-7AM, please contact night-coverage 02/16/2023, 7:06 PM

## 2023-02-17 DIAGNOSIS — R0602 Shortness of breath: Secondary | ICD-10-CM | POA: Diagnosis not present

## 2023-02-17 DIAGNOSIS — C9000 Multiple myeloma not having achieved remission: Secondary | ICD-10-CM | POA: Diagnosis not present

## 2023-02-17 DIAGNOSIS — N179 Acute kidney failure, unspecified: Secondary | ICD-10-CM | POA: Diagnosis not present

## 2023-02-17 DIAGNOSIS — D649 Anemia, unspecified: Secondary | ICD-10-CM | POA: Diagnosis not present

## 2023-02-17 LAB — BRAIN NATRIURETIC PEPTIDE: B Natriuretic Peptide: 618.9 pg/mL — ABNORMAL HIGH (ref 0.0–100.0)

## 2023-02-17 LAB — CBC
HCT: 21.6 % — ABNORMAL LOW (ref 36.0–46.0)
Hemoglobin: 7.1 g/dL — ABNORMAL LOW (ref 12.0–15.0)
MCH: 31.1 pg (ref 26.0–34.0)
MCHC: 32.9 g/dL (ref 30.0–36.0)
MCV: 94.7 fL (ref 80.0–100.0)
Platelets: 309 10*3/uL (ref 150–400)
RBC: 2.28 MIL/uL — ABNORMAL LOW (ref 3.87–5.11)
RDW: 14.5 % (ref 11.5–15.5)
WBC: 11.5 10*3/uL — ABNORMAL HIGH (ref 4.0–10.5)
nRBC: 0 % (ref 0.0–0.2)

## 2023-02-17 LAB — BASIC METABOLIC PANEL
Anion gap: 9 (ref 5–15)
BUN: 41 mg/dL — ABNORMAL HIGH (ref 6–20)
CO2: 18 mmol/L — ABNORMAL LOW (ref 22–32)
Calcium: 8.2 mg/dL — ABNORMAL LOW (ref 8.9–10.3)
Chloride: 106 mmol/L (ref 98–111)
Creatinine, Ser: 2.12 mg/dL — ABNORMAL HIGH (ref 0.44–1.00)
GFR, Estimated: 26 mL/min — ABNORMAL LOW (ref >60)
Glucose, Bld: 117 mg/dL — ABNORMAL HIGH (ref 70–99)
Potassium: 3.8 mmol/L (ref 3.5–5.1)
Sodium: 133 mmol/L — ABNORMAL LOW (ref 135–145)

## 2023-02-17 LAB — D-DIMER, QUANTITATIVE: D-Dimer, Quant: 3.63 ug{FEU}/mL — ABNORMAL HIGH (ref 0.00–0.50)

## 2023-02-17 LAB — MAGNESIUM: Magnesium: 1.6 mg/dL — ABNORMAL LOW (ref 1.7–2.4)

## 2023-02-17 MED ORDER — CLONAZEPAM 0.5 MG PO TABS
1.0000 mg | ORAL_TABLET | Freq: Two times a day (BID) | ORAL | Status: DC
  Administered 2023-02-17 – 2023-02-20 (×6): 1 mg via ORAL
  Filled 2023-02-17 (×6): qty 2

## 2023-02-17 MED ORDER — HEPARIN SOD (PORK) LOCK FLUSH 100 UNIT/ML IV SOLN: 500.0000 [IU] | Freq: Once | INTRAVENOUS | Status: DC | PRN

## 2023-02-17 MED ORDER — MAGNESIUM SULFATE 2 GM/50ML IV SOLN
2.0000 g | Freq: Once | INTRAVENOUS | Status: AC
  Administered 2023-02-17: 2 g via INTRAVENOUS
  Filled 2023-02-17: qty 50

## 2023-02-17 MED ORDER — CLONAZEPAM 0.5 MG PO TABS
0.5000 mg | ORAL_TABLET | Freq: Once | ORAL | Status: AC
  Administered 2023-02-17: 0.5 mg via ORAL
  Filled 2023-02-17: qty 1

## 2023-02-17 MED ORDER — FAMOTIDINE IN NACL 20-0.9 MG/50ML-% IV SOLN: 20.0000 mg | Freq: Once | INTRAVENOUS | Status: DC | PRN

## 2023-02-17 MED ORDER — SODIUM CHLORIDE 0.9 % IV SOLN
500.0000 mg/m2 | Freq: Once | INTRAVENOUS | Status: AC
  Administered 2023-02-18: 1000 mg via INTRAVENOUS
  Filled 2023-02-17: qty 50

## 2023-02-17 MED ORDER — PALONOSETRON HCL INJECTION 0.25 MG/5ML
0.2500 mg | Freq: Once | INTRAVENOUS | Status: AC
  Administered 2023-02-18: 0.25 mg via INTRAVENOUS
  Filled 2023-02-17: qty 5

## 2023-02-17 MED ORDER — SODIUM CHLORIDE 0.9 % IV SOLN: Freq: Once | INTRAVENOUS | Status: AC

## 2023-02-17 MED ORDER — APIXABAN 5 MG PO TABS
10.0000 mg | ORAL_TABLET | Freq: Two times a day (BID) | ORAL | Status: DC
  Administered 2023-02-17 – 2023-02-18 (×3): 10 mg via ORAL
  Filled 2023-02-17 (×3): qty 2

## 2023-02-17 MED ORDER — ALTEPLASE 2 MG IJ SOLR: 2.0000 mg | Freq: Once | INTRAMUSCULAR | Status: DC | PRN

## 2023-02-17 MED ORDER — BORTEZOMIB CHEMO SQ INJECTION 3.5 MG (2.5MG/ML)
1.3000 mg/m2 | Freq: Once | INTRAMUSCULAR | Status: AC
  Administered 2023-02-18: 2.5 mg via SUBCUTANEOUS
  Filled 2023-02-17: qty 1

## 2023-02-17 MED ORDER — SODIUM CHLORIDE 0.9 % IV SOLN: Freq: Once | INTRAVENOUS | Status: DC | PRN

## 2023-02-17 MED ORDER — SODIUM CHLORIDE 0.9 % IV SOLN
40.0000 mg | Freq: Once | INTRAVENOUS | Status: AC
  Administered 2023-02-18: 40 mg via INTRAVENOUS
  Filled 2023-02-17: qty 4

## 2023-02-17 MED ORDER — SODIUM CHLORIDE 0.9% FLUSH: 10.0000 mL | INTRAVENOUS | Status: DC | PRN

## 2023-02-17 MED ORDER — HEPARIN SOD (PORK) LOCK FLUSH 100 UNIT/ML IV SOLN: 250.0000 [IU] | Freq: Once | INTRAVENOUS | Status: DC | PRN

## 2023-02-17 MED ORDER — METHYLPREDNISOLONE SODIUM SUCC 125 MG IJ SOLR: 125.0000 mg | Freq: Once | INTRAMUSCULAR | Status: DC | PRN

## 2023-02-17 MED ORDER — ALBUTEROL SULFATE (2.5 MG/3ML) 0.083% IN NEBU
2.5000 mg | INHALATION_SOLUTION | Freq: Once | RESPIRATORY_TRACT | Status: AC | PRN
  Administered 2023-02-19: 2.5 mg via RESPIRATORY_TRACT
  Filled 2023-02-17: qty 3

## 2023-02-17 MED ORDER — SODIUM CHLORIDE 0.9% FLUSH: 3.0000 mL | INTRAVENOUS | Status: DC | PRN

## 2023-02-17 MED ORDER — DIPHENHYDRAMINE HCL 50 MG/ML IJ SOLN: 50.0000 mg | Freq: Once | INTRAMUSCULAR | Status: DC | PRN

## 2023-02-17 MED ORDER — APIXABAN 5 MG PO TABS: 5.0000 mg | ORAL_TABLET | Freq: Two times a day (BID) | ORAL | Status: DC

## 2023-02-17 MED ORDER — EPINEPHRINE 0.3 MG/0.3ML IJ SOAJ: 0.3000 mg | Freq: Once | INTRAMUSCULAR | Status: DC | PRN

## 2023-02-17 NOTE — Plan of Care (Signed)

## 2023-02-17 NOTE — Progress Notes (Addendum)
Hematology/Oncology Consult note [facility]  [phone] [phone]  Patient Care Team: Smitty Cords, DO as PCP - General (Family Medicine) Lemar Livings Merrily Pew, MD (General Surgery) Shelia Media, MD (Internal Medicine) Creig Hines, MD as Consulting Physician (Oncology)   Name of the patient: Paula Garrett  [id number]  [birth year]   Date of visit: 02/16/58   Interval history-  Patient was seen today as bedside.  Overnight, she had shortness of breath.  Also had fever of 101.9 on 02/16/2023 at 3 PM.  Chest x-ray was done which showed no peripheral area that showed consolidation within the left midlung consistent with focal pneumonia or pulmonary infarct.  Nodular areas of consolidation bilaterally which may be infectious.  Chronic vascular congestion.  She also complains of pain to her middle area of the chest which has been chronic worse today.  Just received her pain medications.  Also put on 2 L oxygen nasal cannula for comfort.  On room air saturating 92%.  Review of systems- Review of Systems  Constitutional:  Positive for malaise/fatigue. Negative for chills, fever and weight loss.  HENT:  Negative for congestion, ear discharge and nosebleeds.   Eyes:  Negative for blurred vision.  Respiratory:  Positive for shortness of breath. Negative for cough, hemoptysis, sputum production and wheezing.   Cardiovascular:  Negative for chest pain, palpitations, orthopnea and claudication.  Gastrointestinal:  Negative for abdominal pain, blood in stool, constipation, diarrhea, heartburn, melena, nausea and vomiting.  Genitourinary:  Negative for dysuria, flank pain, frequency, hematuria and urgency.  Musculoskeletal:  Positive for back pain. Negative for joint pain and myalgias.  Skin:  Negative for rash.  Neurological:  Negative for dizziness, tingling, focal weakness, seizures, weakness and headaches.  Endo/Heme/Allergies:  Does  not bruise/bleed easily.  Psychiatric/Behavioral:  Negative for depression and suicidal ideas. The patient does not have insomnia.       Allergies  Allergen Reactions   Hctz [Hydrochlorothiazide]     Hyponatremia      Past Medical History:  Diagnosis Date   Anxiety    Arthritis    Asthma    Barrett's esophagus 2015   COVID-19    03/10/20   Depression    GERD (gastroesophageal reflux disease)    Hypertension    Hypothyroidism    Pneumonia    Smoldering myeloma    Thyroid disease      Past Surgical History:  Procedure Laterality Date   ABLATION  2006   APPLICATION OF INTRAOPERATIVE CT SCAN N/A 01/28/2023   Procedure: APPLICATION OF INTRAOPERATIVE CT SCAN;  Surgeon: Loreen Freud, MD;  Location: [facility] ORS;  Service: Neurosurgery;  Laterality: N/A;   BREAST CYST ASPIRATION Left 1998   BREAST SURGERY     cyst aspiration   BREAST SURGERY  1998   cyst aspiration   COLONOSCOPY  03/2014   COLONOSCOPY WITH PROPOFOL N/A 01/06/2020   Procedure: COLONOSCOPY WITH PROPOFOL;  Surgeon: Toledo, Boykin Nearing, MD;  Location: [facility] ENDOSCOPY;  Service: Gastroenterology;  Laterality: N/A;   ENDOMETRIAL ABLATION     ESOPHAGOGASTRODUODENOSCOPY (EGD) WITH PROPOFOL N/A 08/15/2015   Procedure: ESOPHAGOGASTRODUODENOSCOPY (EGD) WITH PROPOFOL;  Surgeon: Christena Deem, MD;  Location: [facility] ENDOSCOPY;  Service: Endoscopy;  Laterality: N/A;   ESOPHAGOGASTRODUODENOSCOPY (EGD) WITH PROPOFOL N/A 01/22/2016   Procedure: ESOPHAGOGASTRODUODENOSCOPY (EGD) WITH PROPOFOL;  Surgeon: Christena Deem, MD;  Location: [facility] ENDOSCOPY;  Service: Endoscopy;  Laterality: N/A;   ESOPHAGOGASTRODUODENOSCOPY (EGD) WITH PROPOFOL N/A 01/06/2020  Procedure: ESOPHAGOGASTRODUODENOSCOPY (EGD) WITH PROPOFOL;  Surgeon: Toledo, Boykin Nearing, MD;  Location: [facility] ENDOSCOPY;  Service: Gastroenterology;  Laterality: N/A;   GASTRIC BYPASS  2005   HERNIA REPAIR     IR BONE MARROW BIOPSY & ASPIRATION  02/05/2023   NASAL SINUS SURGERY      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   TOTAL THYROIDECTOMY     TUBAL LIGATION      Social History   Socioeconomic History   Marital status: Divorced    Spouse name: Not on file   Number of children: 2   Years of education: Not on file   Highest education level: GED or equivalent  Occupational History   Occupation: Part-time  Tobacco Use   Smoking status: Every Day    Current packs/day: 1.00    Average packs/day: 1 pack/day for 42.9 years (42.9 ttl pk-yrs)    Types: Cigarettes    Start date: 03/15/1980   Smokeless tobacco: Never  Vaping Use   Vaping status: Never Used  Substance and Sexual Activity   Alcohol use: Yes    Alcohol/week: 10.0 - 12.0 standard drinks of alcohol    Types: 8 - 10 Glasses of wine, 2 Standard drinks or equivalent per week   Drug use: No   Sexual activity: Yes    Partners: Male  Other Topics Concern   Not on file  Social History Narrative   Lives in [place] single, divorced       Work - 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       Diet - healthy diet   Exercise - limited   Caffeine use: daily   Social Determinants of Health   Financial Resource Strain: Low Risk  (11/01/2022)   Overall Financial Resource Strain (CARDIA)    Difficulty of Paying Living Expenses: Not very hard  Food Insecurity: No Food Insecurity (02/10/2023)   Hunger Vital Sign    Worried About Running Out of Food in the Last Year: Never true    Ran Out of Food in the Last Year: Never true  Transportation Needs: No Transportation Needs (02/10/2023)   PRAPARE - Administrator, Civil Service (Medical): No    Lack of Transportation (Non-Medical): No  Physical Activity: Insufficiently Active (11/01/2022)   Exercise Vital Sign    Days of Exercise per Week: 3 days    Minutes of Exercise per Session: 20 min  Stress: Stress Concern Present (11/01/2022)   [employer] of Occupational Health - Occupational Stress Questionnaire    Feeling of Stress : Very much   Social Connections: Socially Isolated (11/01/2022)   Social Connection and Isolation Panel [NHANES]    Frequency of Communication with Friends and Family: More than three times a week    Frequency of Social Gatherings with Friends and Family: Once a week    Attends Religious Services: Never    Database administrator or Organizations: No    Attends Engineer, structural: Not on file    Marital Status: Divorced  Intimate Partner Violence: Not At Risk (02/10/2023)   Humiliation, Afraid, Rape, and Kick questionnaire    Fear of Current or Ex-Partner: No    Emotionally Abused: No    Physically Abused: No    Sexually Abused: No    Family History  Problem Relation Age of Onset   Heart disease Mother    COPD Mother    Arthritis Mother    Cancer Mother  uterine   Lupus Mother    Anxiety disorder Mother    Depression Mother    Drug abuse Mother    COPD Father    Arthritis Father    Heart disease Father    Heart attack Father    Alcohol abuse Father    Heart disease Brother    Heart attack Brother    Drug abuse Brother    Anxiety disorder Brother    Depression Brother    Heart disease Brother    Cancer Brother        liver cancer age 14 y.o   Diabetes Maternal Grandmother    Diabetes Maternal Grandfather    Diabetes Paternal Grandmother    Diabetes Paternal Grandfather    Breast cancer Neg Hx      Current Facility-Administered Medications:    0.9 %  sodium chloride infusion, , Intravenous, Once, Creig Hines, MD   0.9 %  sodium chloride infusion, , Intravenous, Once PRN, Creig Hines, MD   acetaminophen (TYLENOL) tablet 1,000 mg, 1,000 mg, Oral, TID PRN, Darlin Priestly, MD, 1,000 mg at 02/16/23 1629   albuterol (PROVENTIL) (2.5 MG/3ML) 0.083% nebulizer solution 2.5 mg, 2.5 mg, Nebulization, Once PRN, Creig Hines, MD   albuterol (PROVENTIL) (2.5 MG/3ML) 0.083% nebulizer solution 3 mL, 3 mL, Inhalation, Q6H PRN, Mikey College T, MD, 3 mL at 02/17/23 1036    allopurinol (ZYLOPRIM) tablet 100 mg, 100 mg, Oral, Daily, Creig Hines, MD, 100 mg at 02/17/23 0937   alteplase (CATHFLO ACTIVASE) injection 2 mg, 2 mg, Intracatheter, Once PRN, Creig Hines, MD   amLODipine (NORVASC) tablet 10 mg, 10 mg, Oral, Daily, Darlin Priestly, MD, 10 mg at 02/17/23 8119   bisacodyl (DULCOLAX) EC tablet 10 mg, 10 mg, Oral, Daily, Enedina Finner, MD, 10 mg at 02/15/23 1024   [START ON 02/18/2023] bortezomib SQ (VELCADE) chemo injection (2.5mg /mL concentration) 2.5 mg, 1.3 mg/m2 (Treatment Plan Recorded), Subcutaneous, Once, Creig Hines, MD   butalbital-acetaminophen-caffeine (FIORICET) 50-325-40 MG per tablet 1 tablet, 1 tablet, Oral, Q4H PRN, Andris Baumann, MD, 1 tablet at 02/17/23 1478   Chlorhexidine Gluconate Cloth 2 % PADS 6 each, 6 each, Topical, Daily, Enedina Finner, MD, 6 each at 02/15/23 1027   clonazePAM (KLONOPIN) tablet 1 mg, 1 mg, Oral, BID, Darlin Priestly, MD   cyclobenzaprine (FLEXERIL) tablet 5-10 mg, 5-10 mg, Oral, TID PRN, Mikey College T, MD, 10 mg at 02/17/23 2956   [START ON 02/18/2023] cyclophosphamide (CYTOXAN) 1,000 mg in sodium chloride 0.9 % 250 mL chemo infusion, 500 mg/m2 (Treatment Plan Recorded), Intravenous, Once, Creig Hines, MD   [START ON 02/18/2023] dexamethasone (DECADRON) 40 mg in sodium chloride 0.9 % 50 mL IVPB, 40 mg, Intravenous, Once, Creig Hines, MD   diphenhydrAMINE (BENADRYL) injection 50 mg, 50 mg, Intravenous, Once PRN, Creig Hines, MD   docusate sodium (COLACE) capsule 100 mg, 100 mg, Oral, BID, Mikey College T, MD, 100 mg at 02/16/23 2216   EPINEPHrine (EPI-PEN) injection 0.3 mg, 0.3 mg, Intramuscular, Once PRN, Creig Hines, MD   escitalopram (LEXAPRO) tablet 10 mg, 10 mg, Oral, Daily, Mikey College T, MD, 10 mg at 02/17/23 2130   famotidine (PEPCID) IVPB 20 mg premix, 20 mg, Intravenous, Once PRN, Creig Hines, MD   fentaNYL (DURAGESIC) 25 MCG/HR 1 patch, 1 patch, Transdermal, Q72H, Darlin Priestly, MD, 1 patch at 02/15/23 1600    gabapentin (NEURONTIN) capsule 100 mg, 100 mg, Oral, BID, Emeline General, MD,  100 mg at 02/17/23 0937   heparin injection 5,000 Units, 5,000 Units, Subcutaneous, Q12H, Mikey College T, MD, 5,000 Units at 02/17/23 2130   heparin lock flush 100 unit/mL, 500 Units, Intracatheter, Once PRN, Creig Hines, MD   heparin lock flush 100 unit/mL, 250 Units, Intracatheter, Once PRN, Creig Hines, MD   hydrALAZINE (APRESOLINE) injection 10 mg, 10 mg, Intravenous, Q6H PRN, Darlin Priestly, MD, 10 mg at 02/16/23 1628   HYDROmorphone (DILAUDID) tablet 1-2 mg, 1-2 mg, Oral, Q4H PRN, Darlin Priestly, MD, 2 mg at 02/17/23 1041   levothyroxine (SYNTHROID) tablet 250 mcg, 250 mcg, Oral, QAC breakfast, Mikey College T, MD, 250 mcg at 02/17/23 8657   loratadine (CLARITIN) tablet 10 mg, 10 mg, Oral, Daily, Mikey College T, MD, 10 mg at 02/17/23 8469   magnesium gluconate (MAGONATE) tablet 500 mg, 500 mg, Oral, Daily, Mosetta Pigeon, MD, 500 mg at 02/17/23 0936   magnesium sulfate IVPB 2 g 50 mL, 2 g, Intravenous, Once, Darlin Priestly, MD   methylPREDNISolone sodium succinate (SOLU-MEDROL) 125 mg/2 mL injection 125 mg, 125 mg, Intravenous, Once PRN, Creig Hines, MD   mometasone-formoterol (DULERA) 200-5 MCG/ACT inhaler 2 puff, 2 puff, Inhalation, BID, Mikey College T, MD, 2 puff at 02/17/23 0941   [START ON 02/18/2023] palonosetron (ALOXI) injection 0.25 mg, 0.25 mg, Intravenous, Once, Creig Hines, MD   pantoprazole (PROTONIX) EC tablet 40 mg, 40 mg, Oral, Daily, Mikey College T, MD, 40 mg at 02/17/23 6295   polyethylene glycol (MIRALAX / GLYCOLAX) packet 17 g, 17 g, Oral, Daily, Borders, Daryl Eastern, NP, 17 g at 02/14/23 2841   sodium bicarbonate tablet 650 mg, 650 mg, Oral, TID, Kolluru, Sarath, MD, 650 mg at 02/17/23 0937   sodium chloride flush (NS) 0.9 % injection 10 mL, 10 mL, Intracatheter, PRN, Creig Hines, MD   sodium chloride flush (NS) 0.9 % injection 10 mL, 10 mL, Intracatheter, PRN, Creig Hines, MD   sodium chloride flush  (NS) 0.9 % injection 3 mL, 3 mL, Intracatheter, PRN, Creig Hines, MD   sodium chloride flush (NS) 0.9 % injection 3 mL, 3 mL, Intracatheter, PRN, Creig Hines, MD   umeclidinium bromide (INCRUSE ELLIPTA) 62.5 MCG/ACT 1 puff, 1 puff, Inhalation, Daily, Mikey College T, MD, 1 puff at 02/17/23 0941   zolpidem (AMBIEN) tablet 5-10 mg, 5-10 mg, Oral, QHS PRN, Mikey College T, MD, 10 mg at 02/14/23 0041  Physical exam:  Vitals:   02/16/23 2224 02/17/23 0230 02/17/23 0457 02/17/23 0753  BP: (!) 142/89 (!) 159/96 134/79 (!) 147/78  Pulse: 96 (!) 105 98 (!) 101  Resp: (!) 28 20 (!) 24 18  Temp: 98.5 F (36.9 C) 99.7 F (37.6 C) 98.4 F (36.9 C) 98 F (36.7 C)  TempSrc: Oral   Oral  SpO2: 100% 98% 92% 92%  Weight:      Height:       Physical Exam Cardiovascular:     Rate and Rhythm: Normal rate and regular rhythm.     Heart sounds: Normal heart sounds.  Pulmonary:     Effort: Pulmonary effort is normal.     Breath sounds: Normal breath sounds.  Abdominal:     General: Bowel sounds are normal.     Palpations: Abdomen is soft.  Skin:    General: Skin is warm and dry.  Neurological:     Mental Status: She is alert and oriented to person, place, and time.  Latest Ref Rng & Units 02/17/2023    3:40 AM  CMP  Glucose 70 - 99 mg/dL 010   BUN 6 - 20 mg/dL 41   Creatinine 2.72 - 1.00 mg/dL 5.36   Sodium 644 - 034 mmol/L 133   Potassium 3.5 - 5.1 mmol/L 3.8   Chloride 98 - 111 mmol/L 106   CO2 22 - 32 mmol/L 18   Calcium 8.9 - 10.3 mg/dL 8.2       Latest Ref Rng & Units 02/17/2023    3:40 AM  CBC  WBC 4.0 - 10.5 K/uL 11.5   Hemoglobin 12.0 - 15.0 g/dL 7.1   Hematocrit 74.2 - 46.0 % 21.6   Platelets 150 - 400 K/uL 309     @IMAGES @  DG Chest Port 1 View  Result Date: 02/16/2023 CLINICAL DATA:  Fever of unknown origin, history of multiple myeloma EXAM: PORTABLE CHEST 1 VIEW COMPARISON:  02/10/2023, 02/12/2023 FINDINGS: Single frontal view of the chest demonstrates stable  enlargement the cardiac silhouette. Chronic vascular congestion. Nodular areas of consolidation are again seen bilaterally, unchanged since recent PET scan. There is a new peripheral area of wedge-shaped consolidation within the left midlung, measuring up to 6.3 cm. Small left pleural effusion. No pneumothorax. Postsurgical changes from thoracic fusion. Destructive lesions of the midthoracic spine are not well visualized on this x-ray. IMPRESSION: 1. New peripheral area of wedge-shaped consolidation within the left mid lung, most consistent with focal pneumonia or pulmonary infarct given rapid development since recent PET CT. 2. Small left pleural effusion. 3. Nodular areas of consolidation bilaterally, which may be inflammatory or infectious. No change since recent PET scan. 4. Chronic vascular congestion. Electronically Signed   By: Sharlet Salina M.D.   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 EXAM: NUCLEAR MEDICINE PET WHOLE BODY TECHNIQUE: 10.1 mCi F-18 FDG was injected intravenously. Full-ring PET imaging was performed from the head to foot after the radiotracer. CT data was obtained and used for attenuation correction and anatomic localization. Fasting blood glucose: 100 mg/dl COMPARISON:  CT chest 59/56/3875 FINDINGS: Mediastinal blood pool activity: SUV max HEAD/NECK: No hypermetabolic activity in the scalp. No hypermetabolic cervical lymph nodes. Incidental CT findings: none CHEST: At the LEFT paraspinal resection site (T6-T7), there is residual fluid and soft tissue density tissue measuring 6.1 x 3.7 cm. This tissue has no significant metabolic activity. Activity is equal to background blood pool activity. Posterior fusion at this level. No hypermetabolic tissue within the thorax to suggest malignancy within the bones or soft  tissue. There are several small nodules in the LEFT lung apex (image 54/6) without metabolic activity. These are new from comparison CT 01/24/2023 and therefore favored foci of infection or inflammation. Small bilateral pleural effusions are present greater on the LEFT. Incidental CT findings: none ABDOMEN/PELVIS: There is moderate metabolic activity through the GE junction. There is postsurgical anatomy of the proximal stomach. No abnormal metabolic activity liver. Scattered physiologic activity in the bowel. No hypermetabolic lymph nodes in the abdomen pelvis. Incidental CT findings: Foley catheter in bladder. SKELETON: Hypermetabolic lesion in the L4 vertebral body with SUV max equal 5.4. associated lytic lesion measuring 19 mm on image 121/series 6. EXTREMITIES: No abnormal hypermetabolic activity in the lower extremities. Incidental CT findings: No sclerotic or lytic lesions on CT imaging. IMPRESSION: 1. Solitary hypermetabolic lytic  lesion in the L4 vertebral body consistent multiple myeloma. Metabolic activity is moderate. 2. Residual soft tissue density at the LEFT paraspinal resection site (T6-T7) with no significant metabolic activity. Favor benign postsurgical tissue. 3. Small bilateral pleural effusions greater on the LEFT. 4. Small nodules in the LEFT lung apex without metabolic activity are favored foci of infection or inflammation. 5. Moderate metabolic activity through the GE junction is favored physiologic or inflammatory. Electronically Signed   By: Genevive Bi M.D.   On: 02/12/2023 13:47   US RENAL  Result Date: 02/10/2023 CLINICAL DATA:  AKI EXAM: RENAL / URINARY TRACT ULTRASOUND COMPLETE COMPARISON:  None Available. FINDINGS: Right Kidney: Renal measurements: 12.5 cm x 5.6 cm x 4.6 cm = volume: 165 mL. Parenchymal echogenicity is normal. There is no hydronephrosis. Left Kidney: Renal measurements: 12.6 cm x 6.4 cm x 5.0 cm = volume: 213 mL. Parenchymal echogenicity is normal. There is  no hydronephrosis. Bladder: Decompressed by a Foley catheter. Other: None. IMPRESSION: Unremarkable renal ultrasound. Electronically Signed   By: Lesia Hausen M.D.   On: 02/10/2023 17:03   DG Chest 1 View  Result Date: 02/10/2023 CLINICAL DATA:  Acute kidney injury. Sodium 120. Elevated creatinine. EXAM: CHEST  1 VIEW COMPARISON:  02/01/2023 FINDINGS: Shallow inspiration. Cardiac enlargement. Hazy interstitial infiltrates throughout both lungs most likely representing edema although possibly interstitial pneumonia or chronic fibrosis. Small left pleural effusion with basilar atelectasis or consolidation. This could indicate pneumonia in the appropriate clinical setting. No pneumothorax. Postoperative changes in the thoracic spine. No significant change since prior study. IMPRESSION: Cardiac enlargement with probable interstitial pulmonary edema. Small left pleural effusion with basilar atelectasis or consolidation. Similar appearance to previous study. Electronically Signed   By: Burman Nieves M.D.   On: 02/10/2023 15:50   IR BONE MARROW BIOPSY & ASPIRATION  Result Date: 02/05/2023 INDICATION: Hematologic abnormality EXAM: Bone marrow aspiration and core biopsy using fluoroscopic guidance MEDICATIONS: None. ANESTHESIA/SEDATION: Moderate (conscious) sedation was employed during this procedure. A total of Versed 1.5 mg and Fentanyl 75 mcg was administered intravenously. Moderate Sedation Time: 10 minutes. The patient's level of consciousness and vital signs were monitored continuously by radiology nursing throughout the procedure under my direct supervision. FLUOROSCOPY TIME:  Fluoroscopy Time: 0.7 minutes (13 mGy) COMPLICATIONS: None immediate. PROCEDURE: Informed written consent was obtained from the patient after a thorough discussion of the procedural risks, benefits and alternatives. All questions were addressed. Maximal Sterile Barrier Technique was utilized including caps, mask, sterile gowns,  sterile gloves, sterile drape, hand hygiene and skin antiseptic. A timeout was performed prior to the initiation of the procedure. The patient was placed prone on the exam table. Limited fluoroscopy of the pelvis was performed for planning purposes. Skin entry site was marked, and the overlying skin was prepped and draped in the standard sterile fashion. Local analgesia was obtained with 1% lidocaine. Using fluoroscopic guidance, an 11 gauge needle was advanced just deep to the cortex of the right posterior ilium. Subsequently, bone marrow aspiration and core biopsy were performed. Specimens were submitted to lab/pathology for handling. Hemostasis was achieved with manual pressure, and a clean dressing was placed. The patient tolerated the procedure well without immediate complication. IMPRESSION: Successful bone marrow aspiration and core biopsy of the right posterior ilium. Electronically Signed   By: Olive Bass M.D.   On: 02/05/2023 15:35   CT HEAD WO CONTRAST ( )  Result Date: 02/03/2023 CLINICAL DATA:  Mental status change, unknown cause. Acute hypoxic respiratory failure EXAM: CT  HEAD WITHOUT CONTRAST TECHNIQUE: Contiguous axial images were obtained from the base of the skull through the vertex without intravenous contrast. RADIATION DOSE REDUCTION: This exam was performed according to the departmental dose-optimization program which includes automated exposure control, adjustment of the mA and/or kV according to patient size and/or use of iterative reconstruction technique. COMPARISON:  Bone survey 10/09/2022. FINDINGS: Brain: No acute intracranial abnormality. Specifically, no hemorrhage, hydrocephalus, mass lesion, acute infarction, or significant intracranial injury. Vascular: No hyperdense vessel or unexpected calcification. Skull: Focal lucent lesions throughout the skull. 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 These lytic lesions were not in the skull on the  skull x-ray from 10/09/2022. Appearance is concerning for possible multiple myeloma. No fracture. Sinuses/Orbits: No acute findings Other: None IMPRESSION: No acute intracranial abnormality. Numerous scattered focal lucent lesions throughout the skull. These are new since prior bone survey from 10/09/2022 and concerning for multiple myeloma. Electronically Signed   By: Charlett Nose M.D.   On: 02/03/2023 16:30   DG Chest Port 1 View  Result Date: 02/01/2023 CLINICAL DATA:  Hypoxia EXAM: PORTABLE CHEST 1 VIEW COMPARISON:  01/30/2023 FINDINGS: Bilateral diffuse interstitial thickening and patchy alveolar airspace opacities at the right lung base and left mid lower lung. No pleural effusion or pneumothorax. Stable cardiomediastinal silhouette. No acute osseous abnormality. Posterior spinal fusion of the upper and midthoracic spine. IMPRESSION: 1. Bilateral diffuse interstitial thickening and patchy alveolar airspace opacities at the right lung base and left mid lower lung concerning for pulmonary edema. Electronically Signed   By: Elige Ko M.D.   On: 02/01/2023 10:13   ECHOCARDIOGRAM COMPLETE  Result Date: 01/31/2023    ECHOCARDIOGRAM REPORT   Patient Name:   UNITY PRUITT Date of Exam: 01/31/2023 Medical Rec #:  [id number]      Height:       62.0 in Accession #:    [id number]     Weight:       170.0 lb Date of Birth:  1965/09/05      BSA:          1.784 m Patient Age:    57 years       BP:           106/57 mmHg Patient Gender: F              HR:           110 bpm. Exam Location:  [facility] Procedure: 2D Echo, Cardiac Doppler and Color Doppler Indications:     Dyspnea  History:         Patient has no prior history of Echocardiogram examinations.                  Signs/Symptoms:Dyspnea, Chest Pain, Fatigue and Shortness of                  Breath; Risk Factors:Hypertension, Sleep Apnea and Current                  Smoker.  Sonographer:     Mikki Harbor Referring Phys:  [id number] Alford Highland Diagnosing Phys:  Julien Nordmann MD  Sonographer Comments: Image acquisition challenging due to respiratory motion. IMPRESSIONS  1. Left ventricular ejection fraction, by estimation, is 60 to 65%. The left ventricle has normal function. The left ventricle has no regional wall motion abnormalities. There is mild left ventricular hypertrophy. Left ventricular diastolic parameters are consistent with Grade I diastolic dysfunction (impaired relaxation).  2. Right  ventricular systolic function is normal. The right ventricular size is normal. There is normal pulmonary artery systolic pressure. The estimated right ventricular systolic pressure is 30.9 mmHg.  3. Left atrial size was mildly dilated.  4. The mitral valve is normal in structure. No evidence of mitral valve regurgitation. No evidence of mitral stenosis.  5. The aortic valve is tricuspid. Aortic valve regurgitation is not visualized. No aortic stenosis is present.  6. The inferior vena cava is normal in size with greater than 50% respiratory variability, suggesting right atrial pressure of 3 mmHg. FINDINGS  Left Ventricle: Left ventricular ejection fraction, by estimation, is 60 to 65%. The left ventricle has normal function. The left ventricle has no regional wall motion abnormalities. The left ventricular internal cavity size was normal in size. There is  mild left ventricular hypertrophy. Left ventricular diastolic parameters are consistent with Grade I diastolic dysfunction (impaired relaxation). Right Ventricle: The right ventricular size is normal. No increase in right ventricular wall thickness. Right ventricular systolic function is normal. There is normal pulmonary artery systolic pressure. The tricuspid regurgitant velocity is 2.64 m/s, and  with an assumed right atrial pressure of 3 mmHg, the estimated right ventricular systolic pressure is 30.9 mmHg. Left Atrium: Left atrial size was mildly dilated. Right Atrium: Right atrial size was normal in size. Pericardium:  There is no evidence of pericardial effusion. Mitral Valve: The mitral valve is normal in structure. No evidence of mitral valve regurgitation. No evidence of mitral valve stenosis. MV peak gradient, 8.0 mmHg. The mean mitral valve gradient is 4.0 mmHg. Tricuspid Valve: The tricuspid valve is normal in structure. Tricuspid valve regurgitation is mild . No evidence of tricuspid stenosis. Aortic Valve: The aortic valve is tricuspid. Aortic valve regurgitation is not visualized. No aortic stenosis is present. Aortic valve mean gradient measures 9.0 mmHg. Aortic valve peak gradient measures 18.4 mmHg. Aortic valve area, by VTI measures 3.04  cm. Pulmonic Valve: The pulmonic valve was normal in structure. Pulmonic valve regurgitation is not visualized. No evidence of pulmonic stenosis. Aorta: The aortic root is normal in size and structure. Venous: The inferior vena cava is normal in size with greater than 50% respiratory variability, suggesting right atrial pressure of 3 mmHg. IAS/Shunts: No atrial level shunt detected by color flow Doppler.  LEFT VENTRICLE PLAX 2D LVIDd:         5.80 cm   Diastology LVIDs:         3.60 cm   LV e' medial:    9.36 cm/s LV PW:         0.90 cm   LV E/e' medial:  9.7 LV IVS:        1.00 cm   LV e' lateral:   10.00 cm/s LVOT diam:     2.20 cm   LV E/e' lateral: 9.1 LV SV:         99 LV SV Index:   55 LVOT Area:     3.80 cm  RIGHT VENTRICLE RV Basal diam:  3.45 cm RV Mid diam:    3.00 cm RV S prime:     27.40 cm/s TAPSE (M-mode): 3.3 cm LEFT ATRIUM             Index        RIGHT ATRIUM           Index LA diam:        4.80 cm 2.69 cm/m   RA Area:     18.90 cm LA  Vol (A2C):   59.6 ml 33.41 ml/m  RA Volume:   54.10 ml  30.32 ml/m LA Vol (A4C):   66.9 ml 37.50 ml/m LA Biplane Vol: 65.0 ml 36.43 ml/m  AORTIC VALVE AV Area (Vmax):    3.09 cm AV Area (Vmean):   3.04 cm AV Area (VTI):     3.04 cm AV Vmax:           214.50 cm/s AV Vmean:          136.750 cm/s AV VTI:            0.326 m AV  Peak Grad:      18.4 mmHg AV Mean Grad:      9.0 mmHg LVOT Vmax:         174.50 cm/s LVOT Vmean:        109.500 cm/s LVOT VTI:          0.260 m LVOT/AV VTI ratio: 0.80  AORTA Ao Root diam: 3.20 cm MITRAL VALVE                TRICUSPID VALVE MV Area (PHT): 3.72 cm     TR Peak grad:   27.9 mmHg MV Area VTI:   2.90 cm     TR Vmax:        264.00 cm/s MV Peak grad:  8.0 mmHg MV Mean grad:  4.0 mmHg     SHUNTS MV Vmax:       1.41 m/s     Systemic VTI:  0.26 m MV Vmean:      91.6 cm/s    Systemic Diam: 2.20 cm MV Decel Time: 204 msec MV E velocity: 90.70 cm/s MV A velocity: 117.00 cm/s MV E/A ratio:  0.78 Julien Nordmann MD Electronically signed by Julien Nordmann MD Signature Date/Time: 01/31/2023/5:43:31 PM    Final    DG Thoracic Spine 2 View  Result Date: 01/31/2023 CLINICAL DATA:  Back pain. 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 EXAM: THORACIC SPINE 2 VIEWS COMPARISON:  MRI of the cervicothoracic spine 01/24/2023. Intraoperative imaging of the thoracic spine 01/28/2023. Chest CTA 01/24/2023. 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 Counting superiorly from the T12 level, the patient has undergone midthoracic spinal decompression with posterior pedicle screw and rod fusion from T4 through T9. The hardware appears intact and well positioned. Chronic superior endplate compression deformity at T11 is unchanged. No acute osseous complications are identified. There is mild atelectasis at both lung bases. IMPRESSION: No demonstrated acute findings status post recent midthoracic spinal decompression and fusion. Electronically Signed   By: Carey Bullocks M.D.   On: 01/31/2023 16:27   DG Chest Port 1 View  Result Date: 01/30/2023 CLINICAL DATA:  Cough, shortness of breath EXAM: PORTABLE CHEST 1 VIEW COMPARISON:  01/24/2023 FINDINGS: Cardiomegaly. Diffuse bilateral interstitial pulmonary opacity and trace pleural effusions. Interval  thoracic fusion. IMPRESSION: Cardiomegaly with diffuse bilateral interstitial pulmonary opacity and trace pleural effusions, most consistent with edema. Electronically Signed   By: Jearld Lesch M.D.   On: 01/30/2023 14:07   DG Thoracic Spine 2 View  Result Date: 01/28/2023 CLINICAL DATA:  Elective surgery. T4 through T9 fusion of biopsy for lesion with intra 3D imaging. EXAM: THORACIC SPINE 2 VIEWS COMPARISON:  Preoperative imaging. FINDINGS: Intraoperative 3D imaging of the thoracic spine obtained. Posterior rod with pedicle screws at multiple levels. Known paravertebral mass better assessed on preoperative imaging. No fluoroscopic spot views provided.  Fluoroscopy time 1 minutes 30 seconds. Dose is 97.045 mGy IMPRESSION: Intraoperative 3D imaging of the thoracic spine. Electronically Signed   By: Narda Rutherford M.D.   On: 01/28/2023 16:28   DG C-Arm 1-60 Min-No Report  Result Date: 01/28/2023 Fluoroscopy was utilized by the requesting physician.  No radiographic interpretation.   DG C-Arm 1-60 Min-No Report  Result Date: 01/28/2023 Fluoroscopy was utilized by the requesting physician.  No radiographic interpretation.   DG C-Arm 1-60 Min-No Report  Result Date: 01/28/2023 Fluoroscopy was utilized by the requesting physician.  No radiographic interpretation.   DG C-Arm 1-60 Min-No Report  Result Date: 01/28/2023 Fluoroscopy was utilized by the requesting physician.  No radiographic interpretation.   MR THORACIC SPINE W WO CONTRAST  Result Date: 01/24/2023 CLINICAL DATA:  Soft tissue mass with bony destruction involving the left seventh rib and T6 and T7 vertebral bodies. EXAM: MRI CERVICAL AND THORACIC SPINE WITHOUT AND WITH CONTRAST TECHNIQUE: Multiplanar and multiecho pulse sequences of the cervical spine, to include the craniocervical junction and cervicothoracic junction, and the thoracic spine, were obtained without and with intravenous contrast. CONTRAST:  7mL GADAVIST GADOBUTROL 1  MMOL/ML IV SOLN COMPARISON:  Same day CTA chest, cervical and thoracic spine MRI 07/20/2020 FINDINGS: MRI CERVICAL SPINE FINDINGS Alignment: There is trace retrolisthesis of C3 on C4 and trace anterolisthesis of C5 on C6. Vertebrae: Vertebral body heights are preserved. Background marrow signal is normal. A T1 hypointense/T2 hyperintense lesion in the clivus is unchanged since 2022, favored benign. There is no suspicious marrow signal abnormality or marrow edema in the cervical spine. There is no abnormal marrow enhancement in the cervical spine. There is a 6 mm enhancing lesion in the right occipital calvarium which was not seen on the prior cervical spine MRI from 2022; however, this area may not has been included within the field of view. Cord: Normal in signal and morphology. Posterior Fossa, vertebral arteries, paraspinal tissues: The imaged posterior fossa is unremarkable. The vertebral artery flow voids are normal. The paraspinal soft tissues are unremarkable. Disc levels: There is overall mild degenerative change in the cervical spine without significant spinal canal or neural foraminal stenosis. MRI THORACIC SPINE FINDINGS Alignment:  Normal. Vertebrae: Background marrow signal is normal. There is infiltrative T1 hypointensity occupying most of the T6 and T7 vertebral bodies extending into the left pedicles and facets. There is a bulky soft tissue mass in the adjacent left paraspinal/subpleural soft tissues with destruction of the seventh rib. The bulk of the soft tissue component measures up to proximally 7.2 cm x 3.4 cm x 2.7 cm. 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 There is mild loss of vertebral body height at both levels consistent with associated pathologic  fractures. There is compression deformity of the T11 vertebral body with up to approximately 30% loss of vertebral body height anteriorly and minimal bony retropulsion. There is faint edema along the superior endplate consistent with acute to subacute chronicity. This fracture may be pathologic. There are probable additional lesions involving multiple additional ribs (for example 25-21, 25-24). The above findings are new since the MRI from 2022. Cord:  There is no cord signal abnormality or abnormal enhancement. Paraspinal and other soft tissues: Unremarkable, aside from the bulky paraspinal tumor described above. Disc  levels: There is a small disc protrusion at T10-T11 without significant spinal canal or neural foraminal stenosis. Otherwise, there is overall minimal background degenerative change without significant spinal canal or neural foraminal stenosis. IMPRESSION: 1. Large soft tissue mass in the left paraspinal soft tissues at T6-T7 with destruction of the seventh rib and invasion of the T6 and T7 vertebral bodies with involvement of the left posterior elements as well as epidural tumor in the spinal canal resulting in moderate spinal canal stenosis without frank cord compression or cord edema. 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 2. Acute to subacute compression fracture of the T11 vertebral body with up to approximately 30% loss of vertebral body height and minimal bony retropulsion, possibly pathologic. 3. Small enhancing lesion in the right occipital calvarium, not definitely present on the prior cervical spine from 2022 (though possibly not included within the field of view on that study), suspicious for an additional metastatic or myeloma lesion. 4. Additional probable lesions involving multiple additional ribs. 5. No acute or suspicious finding in the cervical spine. Electronically Signed   By: Lesia Hausen M.D.   On: 01/24/2023 14:33   MR  Cervical Spine W and Wo Contrast  Result Date: 01/24/2023 CLINICAL DATA:  Soft tissue mass with bony destruction involving the left seventh rib and T6 and T7 vertebral bodies. EXAM: MRI CERVICAL AND THORACIC SPINE WITHOUT AND WITH CONTRAST TECHNIQUE: Multiplanar and multiecho pulse sequences of the cervical spine, to include the craniocervical junction and cervicothoracic junction, and the thoracic spine, were obtained without and with intravenous contrast. CONTRAST:  7mL GADAVIST GADOBUTROL 1 MMOL/ML IV SOLN COMPARISON:  Same day CTA chest, cervical and thoracic spine MRI 07/20/2020 FINDINGS: MRI CERVICAL SPINE FINDINGS Alignment: There is trace retrolisthesis of C3 on C4 and trace anterolisthesis of C5 on C6. Vertebrae: Vertebral body heights are preserved. Background marrow signal is normal. A T1 hypointense/T2 hyperintense lesion in the clivus is unchanged since 2022, favored benign. There is no suspicious marrow signal abnormality or marrow edema in the cervical spine. There is no abnormal marrow enhancement in the cervical spine. There is a 6 mm enhancing lesion in the right occipital calvarium which was not seen on the prior cervical spine MRI from 2022; however, this area may not has been included within the field of view. Cord: Normal in signal and morphology. Posterior Fossa, vertebral arteries, paraspinal tissues: The imaged posterior fossa is unremarkable. The vertebral artery flow voids are normal. The paraspinal soft tissues are unremarkable. Disc levels: There is overall mild degenerative change in the cervical spine without significant spinal canal or neural foraminal stenosis. MRI THORACIC SPINE FINDINGS Alignment:  Normal. Vertebrae: Background marrow signal is normal. There is infiltrative T1 hypointensity occupying most of the T6 and T7 vertebral bodies extending into the left pedicles and facets. There is a bulky soft tissue mass in the adjacent left paraspinal/subpleural soft tissues with  destruction of the seventh rib. The bulk of the soft tissue component measures up to proximally 7.2 cm x 3.4 cm x 2.7 cm. 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 There is mild loss of vertebral body height at both levels consistent with associated pathologic fractures. There is compression deformity of the T11 vertebral body with up to  approximately 30% loss of vertebral body height anteriorly and minimal bony retropulsion. There is faint edema along the superior endplate consistent with acute to subacute chronicity. This fracture may be pathologic. There are probable additional lesions involving multiple additional ribs (for example 25-21, 25-24). The above findings are new since the MRI from 2022. Cord:  There is no cord signal abnormality or abnormal enhancement. Paraspinal and other soft tissues: Unremarkable, aside from the bulky paraspinal tumor described above. Disc levels: There is a small disc protrusion at T10-T11 without significant spinal canal or neural foraminal stenosis. Otherwise, there is overall minimal background degenerative change without significant spinal canal or neural foraminal stenosis. IMPRESSION: 1. Large soft tissue mass in the left paraspinal soft tissues at T6-T7 with destruction of the seventh rib and invasion of the T6 and T7 vertebral bodies with involvement of the left posterior elements as well as epidural tumor in the spinal canal resulting in moderate spinal canal stenosis without frank cord compression or cord edema. 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 2. Acute to subacute compression fracture of the T11 vertebral body with up to approximately 30% loss of vertebral body height and  minimal bony retropulsion, possibly pathologic. 3. Small enhancing lesion in the right occipital calvarium, not definitely present on the prior cervical spine from 2022 (though possibly not included within the field of view on that study), suspicious for an additional metastatic or myeloma lesion. 4. Additional probable lesions involving multiple additional ribs. 5. No acute or suspicious finding in the cervical spine. Electronically Signed   By: Lesia Hausen M.D.   On: 01/24/2023 14:33   CT Angio Chest PE W and/or Wo Contrast  Result Date: 01/24/2023 CLINICAL DATA:  Chest pain and shortness of breath for a month. EXAM: CT ANGIOGRAPHY CHEST WITH CONTRAST TECHNIQUE: Multidetector CT imaging of the chest was performed using the standard protocol during bolus administration of intravenous contrast. Multiplanar CT image reconstructions and MIPs were obtained to evaluate the vascular anatomy. RADIATION DOSE REDUCTION: This exam was performed according to the departmental dose-optimization program which includes automated exposure control, adjustment of the mA and/or kV according to patient size and/or use of iterative reconstruction technique. CONTRAST:  75mL OMNIPAQUE IOHEXOL 350 MG/ML SOLN COMPARISON:  X-ray 01/24/2023 and older.  Previous CT January 2017 FINDINGS: Cardiovascular: The thoracic aorta has a normal course and caliber with minimal calcified plaque. There is a bovine type aortic arch, normal variant. Coronary artery calcifications are seen. Please correlate for other coronary risk factors. Heart is nonenlarged. There is slight wall thickening of the left ventricle. Prominent fat along the intra-atrial septum. Pulmonary arteries show some slight enlargement centrally. Please correlate for any evidence of pulmonary artery hypertension. There is heterogeneous enhancement of the pulmonary arterial tree limiting evaluation for emboli. No obvious large and central embolus although several areas are  nondiagnostic. Mediastinum/Nodes: Surgical changes identified with a moderate hiatal hernia. Small thyroid gland. No specific abnormal lymph node enlargement identified in the axillary region, hilum or mediastinum. There are some small nodes identified in the posterior mediastinum, posterior to the descending thoracic aorta such as series 4, image 86 but not pathologic by size criteria. Lungs/Pleura: There is some linear opacity lung bases likely scar or atelectasis. No consolidation. Breathing motion identified. No pleural effusion or pneumothorax. 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 Benign hepatic cysts as well. Musculoskeletal: There is large destructive mass involving the posterior aspect of the left seventh  rib with extension to involve the central canal of the spine in the associated vertebral bodies of T6 and T7. Cord compression is possible. This mass is estimated in diameter on series 4, image 65 at 8.5 by 4.3 cm. Slight extension along the extrapleural space in the posterior mediastinum as well. There is question of some subtle other lucent lesions identified along the spine but indeterminate. Critical Value/emergent results were called by telephone at the time of interpretation on 01/24/2023 at 8:41 am to provider [facility] , who verbally acknowledged these results. Review of the MIP images confirms the above findings. IMPRESSION: Aggressive soft tissue mass with bone destruction involving the left seventh rib as well as the T6 and T7 vertebral bodies and central canal. A neoplastic process is possible. There also is a possibility of significant cord compression with canal encroachment along the spine. Recommend further evaluation with spine MRI with and without contrast and a whole-body bone scan. There appears to be some other subtle lucent bone lesions along the spine and sternum. Please correlate for any history of known malignancy  including such processes as myeloma or other. Poor opacification of the pulmonary arterial tree limiting evaluation. Several areas are non diagnostic for pulmonary emboli. No obvious large and central embolus. Aortic Atherosclerosis (ICD10-I70.0). Electronically Signed   By: Karen Kays M.D.   On: 01/24/2023 11:47   DG Chest 2 View  Result Date: 01/24/2023 CLINICAL DATA:  Chest pain EXAM: CHEST - 2 VIEW COMPARISON:  Chest x-ray dated January 07, 2023 FINDINGS: The heart size and mediastinal contours are within normal limits. Small hiatal hernia. Both lungs are clear. Pleural-based nodular opacity of the left posterior hemithorax. The visualized skeletal structures are unremarkable. IMPRESSION: Pleural-based nodular opacity of the left posterior hemithorax. Recommend further evaluation with contrast enhanced chest CT. Electronically Signed   By: Allegra Lai M.D.   On: 01/24/2023 09:38     Assessment and plan- Patient is a 57 y.o. 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  - s/p Day 4 Velcade on 02/15/2023.  Completed 4 days of 40 mg Decadron  -She is due for cycle 1 day 8 of cytoxan and velcade tmorrow. Currently working up for SOB. Will reassess tomorrow if we can proceed with the treatment as planned.   -Creatinine has been trending down from 5-2.1 today.  # Shortness of breath # Fever -Overnight patient developed shortness of breath.  Chest x-ray showed new peripheral area of entry of consolidation within the left middle lobe, most consistent with focal pneumonia or pulmonary infarct.  Nodular areas of consolidation bilaterally which may be inflammatory or infectious.  With  -VQ scan is ordered to rule out PE. Started on empiric Eliquis  -WBC mildly elevated.  Could be related to Decadron.  Blood cultures pending.  Procalcitonin negative.  COVID-19 negative. Monitor fever curve.  Hold off on antibiotic for now.   -IV fluids discontinued.  Patient has good p.o. intake.  # Anemia -Likely secondary to multiple myeloma. -S/p 1 unit PRBC -Hemoglobin today 7.1.  Daily CBCs.  Transfuse for hemoglobin less than 7.

## 2023-02-17 NOTE — Progress Notes (Signed)
ANTICOAGULATION CONSULT NOTE  Pharmacy Consult for apixaban Indication: pulmonary embolus  Allergies  Allergen Reactions   Hctz [Hydrochlorothiazide]     Hyponatremia     Patient Measurements: Height: 5\' 2"  (157.5 cm) Weight: 88.5 kg (195 lb) IBW/kg (Calculated) : 50.1  Vital Signs: Temp: 98 F (36.7 C) (09/02 0753) Temp Source: Oral (09/02 0753) BP: 147/78 (09/02 0753) Pulse Rate: 101 (09/02 0753)  Labs: Recent Labs    02/15/23 0353 02/16/23 0442 02/17/23 0340  HGB 7.8* 8.4* 7.1*  HCT 23.7* 25.8* 21.6*  PLT 438* 401* 309  CREATININE 3.37* 2.77* 2.12*    Estimated Creatinine Clearance: 29.5 mL/min (A) (by C-G formula based on SCr of 2.12 mg/dL (H)).   Medical History: Past Medical History:  Diagnosis Date   Anxiety    Arthritis    Asthma    Barrett's esophagus 2015   COVID-19    03/10/20   Depression    GERD (gastroesophageal reflux disease)    Hypertension    Hypothyroidism    Pneumonia    Smoldering myeloma    Thyroid disease     Medications:  Scheduled:   allopurinol  100 mg Oral Daily   amLODipine  10 mg Oral Daily   bisacodyl  10 mg Oral Daily   [START ON 02/18/2023] bortezomib SQ  1.3 mg/m2 (Treatment Plan Recorded) Subcutaneous Once   Chlorhexidine Gluconate Cloth  6 each Topical Daily   clonazePAM  1 mg Oral BID   [START ON 02/18/2023] cyclophosphamide  500 mg/m2 (Treatment Plan Recorded) Intravenous Once   docusate sodium  100 mg Oral BID   escitalopram  10 mg Oral Daily   fentaNYL  1 patch Transdermal Q72H   gabapentin  100 mg Oral BID   heparin  5,000 Units Subcutaneous Q12H   levothyroxine  250 mcg Oral QAC breakfast   loratadine  10 mg Oral Daily   magnesium gluconate  500 mg Oral Daily   mometasone-formoterol  2 puff Inhalation BID   [START ON 02/18/2023] palonosetron  0.25 mg Intravenous Once   pantoprazole  40 mg Oral Daily   polyethylene glycol  17 g Oral Daily   sodium bicarbonate  650 mg Oral TID   umeclidinium bromide  1 puff  Inhalation Daily    Assessment: 56 y.o. female with medical history significant of recently diagnosed multiple myeloma, HTN, anxiety/depression, GERD, sent from oncology office for abnormal blood work now suspicious for new PE  Goal of Therapy:  Monitor platelets by anticoagulation protocol: Yes   Plan:  ---start apixaban 10 mg po twice daily for 7 days, then 5 mg po twice daily thereafter (meets only 1/3 dose reduction criteria) ---CBC and SCr at least once weekly per protocol  Lowella Bandy 02/17/2023,11:47 AM

## 2023-02-17 NOTE — Progress Notes (Signed)
PROGRESS NOTE    Paula Garrett  HKV:[id number] DOB: [birth year] DOA: 02/10/2023 PCP: Smitty Cords, DO  105A/105A-AA  LOS: 7 days   Brief hospital course:   Assessment & Plan: Paula Garrett is a 56 y.o. female with multiple medical problems including hypertension, depression, anxiety, history of tobacco abuse, and multiple myeloma, who was hospitalized 01/24/2023 to 02/06/2023 with chest pain and shortness of breath and was found to have a large soft tissue mass in the left T6-T7 paraspinal area with destruction of seventh rib and invasion of T6 and T7 vertebral bodies.  Patient underwent surgery on 8/13 with postop period complicated by pain.  Pathology positive for multiple myeloma.  Patient now readmitted 02/10/2023 with AKI      AKI in the setting of multiple myeloma -- baseline creatinine .93 -- patient came in with creatinine of 5.27--- IV fluids-- 5.3 -- patient making good urine. -- Nephrology consultation with Dr. Wynelle Link.  -- Renal ultrasound no obstructive neuropathy --s/p aggressive MIVF Plan: --d/c MIVF today for concern for fluid overload   Concern for PE --developed fever, tachycardia on 9/1.  CXR showed "New peripheral area of wedge-shaped consolidation within the left mid lung, most consistent with focal pneumonia or pulmonary infarct".  Given chest pain, elevated d-dimer to 3.63 and neg procal, pt most likely has PE (even though pt has been receiving heparin for DVT ppx). --V/Q scan  --start Eliquis while waiting for V/Q scan  Hyperkalemia -- no EKG changes. Patient received hyperkalemia cocktail in the ER. --lokelma 10g x2 --management per nephro  Hyponatremia -- suspected due to renal failure   Multiple myeloma -- patient seen by Dr. Smith Robert -- given significant kidney issues from multiple myeloma patient to be started on inpatient chemotherapy  --completed 4 days of decadron -- Follow further recommendations per Dr. Smith Robert, velcade per schedule    Recent Mass of spinal cord [facility]) --Neurosurgical procedure done on 8/13 by Dr. Katrinka Blazing.   --Patient had a posterior segmental instrumentation T4-T9, posterior lateral arthrodesis from T4-T9, thoracic laminectomy at T6 and T7.   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  --pain management (switched from home oxycontin to Fentanyl patch, switched from home oxycodone PRN to oral dialudid PRN) --cont Fentanyl patch 25   Chronic back pain --pain management (switched from home oxycontin to Fentanyl patch, switched from home oxycodone PRN to oral dialudid PRN) --cont gabapentin --cont Fentanyl patch 25   Asthma COPD -stable --cont daily bronchodilators   Anxiety depression on chronic benzo -- continue Lexapro and Klonopin   Hypothyroidism -- continue Synthroid  Anemia --s/p 1u pRBC (irradiate)  --monitor and transfuse to keep Hgb >7  Mild encephalopathy, ruled out   DVT prophylaxis: Heparin SQ Code Status: Full code  Family Communication:  Level of care: Telemetry Medical Dispo:   The patient is from: home Anticipated d/c is to: home Anticipated d/c date is: 2-3 days   Subjective and Interval History:  Pt had dyspnea overnight.    During rounds, pt reported dyspnea improved.  Still had chest pain.    Due to high concern for PE, eliquis started while waiting for V/Q scan.   Objective: Vitals:   02/17/23 1158 02/17/23 1200 02/17/23 1511 02/17/23 1936  BP:   (!) 144/97 127/69  Pulse:   100 95  Resp:   (!) 22 (!) 24  Temp:   98.6 F (37 C) 99 F (37.2 C)  TempSrc:   Oral Oral  SpO2: (!) 87% 95% 93% 98%  Weight:      Height:        Intake/Output Summary (Last 24 hours) at 02/17/2023 2026 Last data filed at 02/17/2023 1500 Gross per 24 hour  Intake 1706.82 ml  Output --  Net 1706.82 ml    Filed Weights   02/10/23 1805 02/12/23 1053  Weight: 81.6 kg 88.5 kg    Examination:   Constitutional: NAD, AAOx3 HEENT: conjunctivae and lids normal, EOMI CV: No  cyanosis.   RESP: normal respiratory effort, on RA Neuro: II - XII grossly intact.     Data Reviewed: I have personally reviewed labs and imaging studies  Time spent: 50 minutes  Darlin Priestly, MD [facility] If 7PM-7AM, please contact night-coverage 02/17/2023, 8:26 PM

## 2023-02-17 NOTE — Progress Notes (Signed)
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 Hospital course complicated by AKI and electrolyte abnormalities.  Patient had an episode of shortness of breath overnight. COVID test is negative.  Chest x-ray showed new peripheral area of wedge-shaped consolidation within the left midlung consistent with focal pneumonia or pulmonary infarct. Rate of IV fluids was lowered to 50 cc/h. She has about 1+ lower extremity edema today.    Still requiring oxygen by nasal cannula.   Objective:  Vital signs in last 24 hours:  Temp:  [98 F (36.7 C)-101.9 F (38.8 C)] 98 F (36.7 C) (09/02 0753) Pulse Rate:  [96-115] 101 (09/02 0753) Resp:  [18-28] 18 (09/02 0753) BP: (123-191)/(59-96) 147/78 (09/02 0753) SpO2:  [92 %-100 %] 92 % (09/02 0753)  Weight change:  Filed Weights   02/10/23 1805 02/12/23 1053  Weight: 81.6 kg 88.5 kg    Intake/Output: I/O last 3 completed shifts: In: 2576.8 [P.O.:1240; I.V.:1336.8] Out: -    Intake/Output this shift:  No intake/output data recorded.  Physical Exam: General: NAD  Head: Normocephalic, atraumatic. Moist oral mucosal membranes  Eyes: Anicteric  Lungs:  normal effort, bilateral coarse breath sounds.  Heart: Regular rate and rhythm  Abdomen:  Soft, nontender  Extremities: About 1+ peripheral edema.  Neurologic: Alert, moving all four extremities  Skin: No lesions  Access: None    Basic Metabolic Panel: Recent Labs  Lab 02/10/23 1315 02/10/23 1500 02/11/23 0457 02/12/23 0155 02/13/23 0514 02/14/23 0322 02/15/23 0353 02/16/23 0442 02/17/23 0340  NA  --    < > 126*   < > 129* 132* 136 136 133*  K  --    < > 5.4*   < > 5.4* 5.3* 4.6 4.5 3.8  CL  --    < > 98   < > 99 102 108 106 106  CO2  --    < > 18*   < > 17* 17*  20* 20* 18*  GLUCOSE  --    < > 92   < > 99 90 111* 96 117*  BUN  --    < > 59*   < > 69* 70* 66* 54* 41*  CREATININE  --    < > 5.30*   < > 4.63* 3.97* 3.37* 2.77* 2.12*  CALCIUM  --    < > 7.7*   < > 7.9* 8.3* 8.2* 8.8* 8.2*  MG  --   --   --   --  2.1 1.8 1.7 1.5* 1.6*  PHOS 6.4*  --  6.8*  --  6.0*  --   --   --   --    < > = values in this interval not displayed.    Liver Function Tests: No results for input(s): "AST", "ALT", "ALKPHOS", "BILITOT", "PROT", "ALBUMIN" in the last 168 hours.  No results for input(s): "LIPASE", "AMYLASE" in the last 168 hours. No results for input(s): "AMMONIA" in the last 168 hours.  CBC: Recent Labs  Lab 02/13/23 0514 02/14/23 0322 02/15/23 0353 02/16/23 0442 02/17/23 0340  WBC 12.2* 8.7 7.8 11.2* 11.5*  HGB 7.0* 7.7* 7.8* 8.4* 7.1*  HCT 21.0* 23.3* 23.7* 25.8* 21.6*  MCV 94.2 92.5 95.2 95.6 94.7  PLT 424* 413* 438* 401* 309    Cardiac Enzymes: Recent Labs  Lab 02/10/23  1500  CKTOTAL 119    BNP: Invalid input(s): "POCBNP"  CBG: Recent Labs  Lab 02/12/23 1038  GLUCAP 100*    Microbiology: Results for orders placed or performed during the hospital encounter of 02/10/23  Surgical PCR screen     Status: None   Collection Time: 02/10/23  7:09 PM   Specimen: Nasal Mucosa; Nasal Swab  Result Value Ref Range Status   MRSA, PCR NEGATIVE NEGATIVE Final   Staphylococcus aureus NEGATIVE NEGATIVE Final    Comment: (NOTE) The Xpert SA Assay (FDA approved for NASAL specimens in patients 11 years of age and older), is one component of a comprehensive surveillance program. It is not intended to diagnose infection nor to guide or monitor treatment. Performed at [facility], [address] [place], [place] [address]   Culture, blood (Routine X 2) w Reflex to ID Panel     Status: None (Preliminary result)   Collection Time: 02/16/23  4:30 PM   Specimen: BLOOD  Result Value Ref Range Status   Specimen Description BLOOD  RIGHT ANTECUBITAL  Final   Special Requests   Final    BOTTLES DRAWN AEROBIC AND ANAEROBIC Blood Culture adequate volume   Culture   Final    NO GROWTH < 24 HOURS Performed at [facility], [address] [place], [place] [address]    Report Status PENDING  Incomplete  Culture, blood (Routine X 2) w Reflex to ID Panel     Status: None (Preliminary result)   Collection Time: 02/16/23  4:33 PM   Specimen: BLOOD  Result Value Ref Range Status   Specimen Description BLOOD BLOOD LEFT HAND  Final   Special Requests   Final    BOTTLES DRAWN AEROBIC AND ANAEROBIC Blood Culture adequate volume   Culture   Final    NO GROWTH < 24 HOURS Performed at [facility], [address] [place], [place] [address]    Report Status PENDING  Incomplete  SARS Coronavirus 2 by RT PCR (hospital order, performed in [facility] hospital lab) *cepheid single result test* Anterior Nasal Swab     Status: None   Collection Time: 02/16/23  5:45 PM   Specimen: Anterior Nasal Swab  Result Value Ref Range Status   SARS Coronavirus 2 by RT PCR NEGATIVE NEGATIVE Final    Comment: (NOTE) SARS-CoV-2 target nucleic acids are NOT DETECTED.  The SARS-CoV-2 RNA is generally detectable in upper and lower respiratory specimens during the acute phase of infection. The lowest concentration of SARS-CoV-2 viral copies this assay can detect is 250 copies / mL. A negative result does not preclude SARS-CoV-2 infection and should not be used as the sole basis for treatment or other patient management decisions.  A negative result may occur with improper specimen collection / handling, submission of specimen other than nasopharyngeal swab, presence of viral mutation(s) within the areas targeted by this assay, and inadequate number of viral copies (<250 copies / mL). A negative result must be combined with clinical observations, patient history, and epidemiological information.  Fact Sheet for Patients:    RoadLapTop.co.za  Fact Sheet for Healthcare Providers: http://kim-miller.com/  This test is not yet approved or  cleared by the [place] FDA and has been authorized for detection and/or diagnosis of SARS-CoV-2 by FDA under an Emergency Use Authorization (EUA).  This EUA will remain in effect (meaning this test can be used) for the duration of the COVID-19 declaration under Section 564(b)(1) of the Act, 21 U.S.C.  section 360bbb-3(b)(1), unless the authorization is terminated or revoked sooner.  Performed at [facility], [address] [place], [place] [address]     Coagulation Studies: No results for input(s): "LABPROT", "INR" in the last 72 hours.  Urinalysis: No results for input(s): "COLORURINE", "LABSPEC", "PHURINE", "GLUCOSEU", "HGBUR", "BILIRUBINUR", "KETONESUR", "PROTEINUR", "UROBILINOGEN", "NITRITE", "LEUKOCYTESUR" in the last 72 hours.  Invalid input(s): "APPERANCEUR"     Imaging: DG Chest Port 1 View  Result Date: 02/16/2023 CLINICAL DATA:  Fever of unknown origin, history of multiple myeloma EXAM: PORTABLE CHEST 1 VIEW COMPARISON:  02/10/2023, 02/12/2023 FINDINGS: Single frontal view of the chest demonstrates stable enlargement the cardiac silhouette. Chronic vascular congestion. Nodular areas of consolidation are again seen bilaterally, unchanged since recent PET scan. There is a new peripheral area of wedge-shaped consolidation within the left midlung, measuring up to 6.3 cm. Small left pleural effusion. No pneumothorax. Postsurgical changes from thoracic fusion. Destructive lesions of the midthoracic spine are not well visualized on this x-ray. IMPRESSION: 1. New peripheral area of wedge-shaped consolidation within the left mid lung, most consistent with focal pneumonia or pulmonary infarct given rapid development since recent PET CT. 2. Small left pleural effusion. 3. Nodular areas of consolidation bilaterally,  which may be inflammatory or infectious. No change since recent PET scan. 4. Chronic vascular congestion. Electronically Signed   By: Sharlet Salina M.D.   On: 02/16/2023 20:29     Medications:    sodium chloride     famotidine (PEPCID) IV     famotidine (PEPCID) IV      allopurinol  100 mg Oral Daily   amLODipine  10 mg Oral Daily   bisacodyl  10 mg Oral Daily   Chlorhexidine Gluconate Cloth  6 each Topical Daily   clonazePAM  0.5 mg Oral BID   docusate sodium  100 mg Oral BID   escitalopram  10 mg Oral Daily   fentaNYL  1 patch Transdermal Q72H   gabapentin  100 mg Oral BID   heparin  5,000 Units Subcutaneous Q12H   levothyroxine  250 mcg Oral QAC breakfast   loratadine  10 mg Oral Daily   magnesium gluconate  500 mg Oral Daily   mometasone-formoterol  2 puff Inhalation BID   pantoprazole  40 mg Oral Daily   polyethylene glycol  17 g Oral Daily   sodium bicarbonate  650 mg Oral TID   umeclidinium bromide  1 puff Inhalation Daily   acetaminophen, albuterol, alteplase, butalbital-acetaminophen-caffeine, cyclobenzaprine, diphenhydrAMINE, EPINEPHrine, EPINEPHrine, famotidine (PEPCID) IV, famotidine (PEPCID) IV, heparin lock flush, heparin lock flush, heparin lock flush, heparin lock flush, hydrALAZINE, HYDROmorphone, methylPREDNISolone sodium succinate, methylPREDNISolone sodium succinate, sodium chloride flush, sodium chloride flush, sodium chloride flush, sodium chloride flush, zolpidem  Assessment/ Plan:  [name] is a 57 y.o.  female with past medical history hypertension, depression, anxiety, tobacco, and recent diagnosis of multiple myeloma, who was admitted to [facility] on 02/10/2023 for AKI (acute kidney injury) (HCC) [N17.9]   Acute kidney injury appears to multifactorial. Admits to NSAID use and recent diagnosis of multiple myeloma. Differential diagnosis includes myeloma nephropathy. Renal ultrasound negative for hydronephrosis.  Renal function continues to improve.  Good urine output noted. No acute indication for dialysis. Will continue to monitor renal indices.  Due to concerns of fluid overload, we will discontinue IV fluid supplementation.  Patient reports good oral fluid intake.   Lab Results  Component Value Date   CREATININE 2.12 (H) 02/17/2023   CREATININE 2.77 (H)  02/16/2023   CREATININE 3.37 (H) 02/15/2023    Intake/Output Summary (Last 24 hours) at 02/17/2023 1012 Last data filed at 02/17/2023 0500 Gross per 24 hour  Intake 2576.82 ml  Output --  Net 2576.82 ml    2. Hyperkalemia, likely secondary to kidney injury. Potassium corrected to 3.8   3. Acute metabolic acidosis, S bicarb 19 on admission. Improved . Oral supplementation ordered as above.    4. Multiple myeloma diagnosed with bone marrow biopsy on 02/05/23. Following Dr Smith Robert outpatient. Chemotherapy and pain control per oncology team.   5.  Hypomagnesemia.  continue oral magnesium supplements.    LOS: 7 Sanii Kukla 9/2/202410:12 AM

## 2023-02-18 ENCOUNTER — Inpatient Hospital Stay: Payer: Managed Care, Other (non HMO)

## 2023-02-18 ENCOUNTER — Encounter: Payer: Self-pay | Admitting: Oncology

## 2023-02-18 ENCOUNTER — Other Ambulatory Visit: Payer: Self-pay | Admitting: Oncology

## 2023-02-18 DIAGNOSIS — C9000 Multiple myeloma not having achieved remission: Secondary | ICD-10-CM | POA: Diagnosis not present

## 2023-02-18 DIAGNOSIS — N179 Acute kidney failure, unspecified: Secondary | ICD-10-CM | POA: Diagnosis not present

## 2023-02-18 DIAGNOSIS — Z515 Encounter for palliative care: Secondary | ICD-10-CM | POA: Diagnosis not present

## 2023-02-18 LAB — BASIC METABOLIC PANEL
Anion gap: 8 (ref 5–15)
BUN: 33 mg/dL — ABNORMAL HIGH (ref 6–20)
CO2: 21 mmol/L — ABNORMAL LOW (ref 22–32)
Calcium: 8.6 mg/dL — ABNORMAL LOW (ref 8.9–10.3)
Chloride: 104 mmol/L (ref 98–111)
Creatinine, Ser: 1.92 mg/dL — ABNORMAL HIGH (ref 0.44–1.00)
GFR, Estimated: 30 mL/min — ABNORMAL LOW (ref >60)
Glucose, Bld: 107 mg/dL — ABNORMAL HIGH (ref 70–99)
Potassium: 3.9 mmol/L (ref 3.5–5.1)
Sodium: 133 mmol/L — ABNORMAL LOW (ref 135–145)

## 2023-02-18 LAB — MAGNESIUM: Magnesium: 1.8 mg/dL (ref 1.7–2.4)

## 2023-02-18 LAB — CBC
HCT: 23.1 % — ABNORMAL LOW (ref 36.0–46.0)
Hemoglobin: 7.4 g/dL — ABNORMAL LOW (ref 12.0–15.0)
MCH: 30.6 pg (ref 26.0–34.0)
MCHC: 32 g/dL (ref 30.0–36.0)
MCV: 95.5 fL (ref 80.0–100.0)
Platelets: 305 10*3/uL (ref 150–400)
RBC: 2.42 MIL/uL — ABNORMAL LOW (ref 3.87–5.11)
RDW: 14.4 % (ref 11.5–15.5)
WBC: 8.8 10*3/uL (ref 4.0–10.5)
nRBC: 0 % (ref 0.0–0.2)

## 2023-02-18 LAB — STREP PNEUMONIAE URINARY ANTIGEN: Strep Pneumo Urinary Antigen: NEGATIVE

## 2023-02-18 MED ORDER — ASPIRIN 81 MG PO TBEC: 81.0000 mg | DELAYED_RELEASE_TABLET | Freq: Every day | ORAL | 2 refills | Status: DC

## 2023-02-18 MED ORDER — AMOXICILLIN-POT CLAVULANATE 875-125 MG PO TABS
1.0000 | ORAL_TABLET | Freq: Two times a day (BID) | ORAL | Status: DC
  Administered 2023-02-18: 1 via ORAL
  Filled 2023-02-18: qty 1

## 2023-02-18 MED ORDER — HYDROMORPHONE HCL 2 MG PO TABS
2.0000 mg | ORAL_TABLET | ORAL | Status: DC
  Administered 2023-02-18 – 2023-02-20 (×10): 2 mg via ORAL
  Filled 2023-02-18 (×10): qty 1

## 2023-02-18 MED ORDER — TECHNETIUM TO 99M ALBUMIN AGGREGATED
4.5100 | Freq: Once | INTRAVENOUS | Status: AC
  Administered 2023-02-18: 4.51 via INTRAVENOUS

## 2023-02-18 MED ORDER — SODIUM BICARBONATE 650 MG PO TABS: 650.0000 mg | ORAL_TABLET | Freq: Three times a day (TID) | ORAL | 0 refills | Status: AC

## 2023-02-18 MED ORDER — AZITHROMYCIN 500 MG PO TABS
500.0000 mg | ORAL_TABLET | Freq: Every day | ORAL | Status: DC
  Administered 2023-02-18 – 2023-02-21 (×4): 500 mg via ORAL
  Filled 2023-02-18 (×4): qty 1

## 2023-02-18 MED ORDER — ALLOPURINOL 100 MG PO TABS
100.0000 mg | ORAL_TABLET | Freq: Every day | ORAL | 0 refills | Status: DC
Start: 2023-02-19 — End: 2023-04-01

## 2023-02-18 MED ORDER — BUTALBITAL-APAP-CAFFEINE 50-325-40 MG PO TABS: 1.0000 | ORAL_TABLET | ORAL | 0 refills | Status: DC | PRN

## 2023-02-18 MED ORDER — HYDROMORPHONE HCL 2 MG PO TABS: 1.0000 mg | ORAL_TABLET | Freq: Four times a day (QID) | ORAL | 0 refills | Status: DC | PRN

## 2023-02-18 MED ORDER — LORAZEPAM 2 MG/ML IJ SOLN
2.0000 mg | Freq: Once | INTRAMUSCULAR | Status: AC | PRN
  Administered 2023-02-18: 2 mg via INTRAVENOUS
  Filled 2023-02-18: qty 1

## 2023-02-18 MED ORDER — ENOXAPARIN SODIUM 40 MG/0.4ML IJ SOSY
40.0000 mg | PREFILLED_SYRINGE | INTRAMUSCULAR | Status: DC
  Administered 2023-02-19 – 2023-02-20 (×2): 40 mg via SUBCUTANEOUS
  Filled 2023-02-18 (×2): qty 0.4

## 2023-02-18 MED ORDER — ASPIRIN 81 MG PO TBEC
81.0000 mg | DELAYED_RELEASE_TABLET | Freq: Every day | ORAL | Status: DC
  Administered 2023-02-18 – 2023-02-21 (×4): 81 mg via ORAL
  Filled 2023-02-18 (×4): qty 1

## 2023-02-18 MED ORDER — AMOXICILLIN-POT CLAVULANATE 875-125 MG PO TABS: 1.0000 | ORAL_TABLET | Freq: Two times a day (BID) | ORAL | 0 refills | Status: DC

## 2023-02-18 MED ORDER — MORPHINE SULFATE (PF) 4 MG/ML IV SOLN
4.0000 mg | Freq: Once | INTRAVENOUS | Status: AC | PRN
  Administered 2023-02-18: 4 mg via INTRAVENOUS
  Filled 2023-02-18: qty 1

## 2023-02-18 MED ORDER — FENTANYL 25 MCG/HR TD PT72: 1.0000 | MEDICATED_PATCH | TRANSDERMAL | 0 refills | Status: DC

## 2023-02-18 MED ORDER — SODIUM CHLORIDE 0.9 % IV SOLN
2.0000 g | INTRAVENOUS | Status: DC
  Administered 2023-02-18 – 2023-02-20 (×3): 2 g via INTRAVENOUS
  Filled 2023-02-18 (×4): qty 20

## 2023-02-18 MED ORDER — INFLUENZA VIRUS VACC SPLIT PF (FLUZONE) 0.5 ML IM SUSY
0.5000 mL | PREFILLED_SYRINGE | INTRAMUSCULAR | Status: DC
  Filled 2023-02-18: qty 0.5

## 2023-02-18 NOTE — Progress Notes (Signed)
PROGRESS NOTE    VIDUSHI SABEY  XFG:[id number] DOB: 1966/02/05 DOA: 02/10/2023 PCP: Smitty Cords, DO  105A/105A-AA  LOS: 8 days   Brief hospital course:   Assessment & Plan: KYLYNN ONUFER is a 57 y.o. female with multiple medical problems including hypertension, depression, anxiety, history of tobacco abuse, and multiple myeloma, who was hospitalized 01/24/2023 to 02/06/2023 with chest pain and shortness of breath and was found to have a large soft tissue mass in the left T6-T7 paraspinal area with destruction of seventh rib and invasion of T6 and T7 vertebral bodies.  Patient underwent surgery on 8/13 with postop period complicated by pain.  Pathology positive for multiple myeloma.  Patient now readmitted 02/10/2023 with AKI    AKI in the setting of multiple myeloma -- baseline creatinine .93 -- patient came in with creatinine of 5.27.  Cr has improved with IVF and MM treatment. -- patient making good urine. -- Nephrology consultation with Dr. Wynelle Link.  -- Renal ultrasound no obstructive neuropathy --s/p aggressive MIVF Plan: --no further MIVF.  Oral hydration now.   PE, ruled out --V/Q scan today neg for PE. --d/c empiric Eliquis  PNA --developed fever, tachycardia on 9/1.  CXR showed "New peripheral area of wedge-shaped consolidation within the left mid lung, most consistent with focal pneumonia or pulmonary infarct".  Since PE was ruled out today, pt most likely has PNA. --start ceftriaxone and azithromycin today  Acute hypoxemic respiratory failure --likely due to PNA. --ambulation test today found pt desat to 79% on room air and needed 2L O2 to increase to 91%. --Continue supplemental O2 to keep sats >=90%, wean as tolerated  Hyperkalemia -- no EKG changes. Patient received hyperkalemia cocktail in the ER. --lokelma 10g x2 --management per nephro  Hyponatremia -- suspected due to renal failure   Multiple myeloma -- patient seen by Dr. Smith Robert -- given  significant kidney issues from multiple myeloma patient to be started on inpatient chemotherapy  --completed 4 days of decadron Plan: -- Follow further recommendations per Dr. Smith Robert, velcade per schedule --start ASA, per Dr. Smith Robert --cont allopurinol   Recent Mass of spinal cord [facility]) --Neurosurgical procedure done on 8/13 by Dr. Katrinka Blazing.   --Patient had a posterior segmental instrumentation T4-T9, posterior lateral arthrodesis from T4-T9, thoracic laminectomy at T6 and T7.   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  --pain management as below   Chest pain Chronic back pain --switched from home oxycontin to Fentanyl patch, switched from home oxycodone PRN to oral dialudid PRN. --pt claiming she wasn't receiving the pain meds that were charted as given, and requested pain meds to be scheduled. Plan: --cont gabapentin --cont Fentanyl patch 25 --oral dilaudid 2 mg changed to q4h scheduled (Hold when pt is somnolent) --rec 2 RN's as witness to administration of pain meds   Asthma COPD --cont daily bronchodilators   Anxiety depression on chronic benzo -- continue Lexapro and Klonopin   Hypothyroidism -- continue Synthroid  Anemia --s/p 1u pRBC (irradiate)  --monitor and transfuse to keep Hgb >7  Mild encephalopathy, ruled out   DVT prophylaxis: Lovenox SQ Code Status: Full code  Family Communication: family updated at bedside today Level of care: Telemetry Medical Dispo:   The patient is from: home Anticipated d/c is to: home Anticipated d/c date is: 2-3 days.  On IV abx for PNA, new O2 requirement.   Subjective and Interval History:  Today again, pt complained about not receiving PRN oral dilaudid from nursing, even though [facility]  documented administration 5x per day for the past 5 days.  Pt and family requested pain meds to be scheduled.   Objective: Vitals:   02/18/23 1416 02/18/23 1420 02/18/23 1500 02/18/23 2002  BP:   129/69 (!) 120/59  Pulse:   92 95  Resp:   19  18  Temp:   98.2 F (36.8 C) 98.1 F (36.7 C)  TempSrc:   Oral   SpO2: (!) 79% 92% 93% 97%  Weight:      Height:        Intake/Output Summary (Last 24 hours) at 02/18/2023 2214 Last data filed at 02/18/2023 1800 Gross per 24 hour  Intake 343.34 ml  Output 1450 ml  Net -1106.66 ml    Filed Weights   02/10/23 1805 02/12/23 1053  Weight: 81.6 kg 88.5 kg    Examination:   Constitutional: NAD, AAOx3 HEENT: conjunctivae and lids normal, EOMI CV: No cyanosis.   RESP: normal respiratory effort, on RA Neuro: II - XII grossly intact.   Psych: labile mood and affect, anxious, and agitated.     Data Reviewed: I have personally reviewed labs and imaging studies  Time spent: 50 minutes  Darlin Priestly, MD [facility] If 7PM-7AM, please contact night-coverage 02/18/2023, 10:14 PM

## 2023-02-18 NOTE — Progress Notes (Signed)
Chemo RN presented to pt room. Pt verified with 2 separate identifiers. Role of Chemo RN explained. Consent for this admission already obtained. Patient taken for VQ scan. Per Dr Smith Robert, proceed with chemo today as soon as patient arrives back from VQ scan. Questions answered to patient's satisfaction. Upon patient's arrival back to room, RN verified brisk blood return from newly placed IV in R Hand. Flushed easily without resistance. Patient voiced to Chemo RN a complaint from this past weekend and Chemo RN made Chiropodist of unit aware. She is to follow up with patient regarding this. Treatment completed without issue. Patient with no new complaints. Primary RN made aware that treatment has been finished. R hand IV flushed and saline locked. Patient in stable condition upon Chemo RN's departure.

## 2023-02-18 NOTE — Progress Notes (Addendum)
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 Hospital course complicated by AKI and electrolyte abnormalities.  Patient seen resting in bed Alert and oriented Becomes tearful, ready to discharge Reports poor oral intake   Objective:  Vital signs in last 24 hours:  Temp:  [98.6 F (37 C)-99.1 F (37.3 C)] 98.6 F (37 C) (09/03 0747) Pulse Rate:  [90-100] 90 (09/03 0747) Resp:  [20-24] 20 (09/03 0747) BP: (127-150)/(65-97) 128/65 (09/03 0747) SpO2:  [87 %-98 %] 96 % (09/03 0747)  Weight change:  Filed Weights   02/10/23 1805 02/12/23 1053  Weight: 81.6 kg 88.5 kg    Intake/Output: I/O last 3 completed shifts: In: 2206.8 [P.O.:1720; I.V.:436.8; IV Piggyback:50] Out: 900 [Urine:900]   Intake/Output this shift:  Total I/O In: 303.7 [P.O.:240; I.V.:7.4; IV Piggyback:56.3] Out: -   Physical Exam: General: NAD, tearful  Head: Normocephalic, atraumatic. Moist oral mucosal membranes  Eyes: Anicteric  Lungs:  normal effort, wheeze left > right  Heart: Regular rate and rhythm  Abdomen:  Soft, nontender  Extremities: trace peripheral edema.  Neurologic: Alert, moving all four extremities  Skin: No lesions  Access: None    Basic Metabolic Panel: Recent Labs  Lab 02/13/23 0514 02/14/23 0322 02/15/23 0353 02/16/23 0442 02/17/23 0340 02/18/23 0533  NA 129* 132* 136 136 133* 133*  K 5.4* 5.3* 4.6 4.5 3.8 3.9  CL 99 102 108 106 106 104  CO2 17* 17* 20* 20* 18* 21*  GLUCOSE 99 90 111* 96 117* 107*  BUN 69* 70* 66* 54* 41* 33*  CREATININE 4.63* 3.97* 3.37* 2.77* 2.12* 1.92*  CALCIUM 7.9* 8.3* 8.2* 8.8* 8.2* 8.6*  MG 2.1 1.8 1.7 1.5* 1.6* 1.8  PHOS 6.0*  --   --   --   --   --     Liver Function Tests: No results for input(s): "AST", "ALT",  "ALKPHOS", "BILITOT", "PROT", "ALBUMIN" in the last 168 hours.  No results for input(s): "LIPASE", "AMYLASE" in the last 168 hours. No results for input(s): "AMMONIA" in the last 168 hours.  CBC: Recent Labs  Lab 02/14/23 0322 02/15/23 0353 02/16/23 0442 02/17/23 0340 02/18/23 0533  WBC 8.7 7.8 11.2* 11.5* 8.8  HGB 7.7* 7.8* 8.4* 7.1* 7.4*  HCT 23.3* 23.7* 25.8* 21.6* 23.1*  MCV 92.5 95.2 95.6 94.7 95.5  PLT 413* 438* 401* 309 305    Cardiac Enzymes: No results for input(s): "CKTOTAL", "CKMB", "CKMBINDEX", "TROPONINI" in the last 168 hours.   BNP: Invalid input(s): "POCBNP"  CBG: Recent Labs  Lab 02/12/23 1038  GLUCAP 100*    Microbiology: Results for orders placed or performed during the hospital encounter of 02/10/23  Surgical PCR screen     Status: None   Collection Time: 02/10/23  7:09 PM   Specimen: Nasal Mucosa; Nasal Swab  Result Value Ref Range Status   MRSA, PCR NEGATIVE NEGATIVE Final   Staphylococcus aureus NEGATIVE NEGATIVE Final    Comment: (NOTE) The Xpert SA Assay (FDA approved for NASAL specimens in patients 73 years of age and older), is one component of a comprehensive surveillance program. It is not intended to diagnose infection nor to guide or monitor treatment. Performed at [facility], [address] [place], [place] [address]   Culture, blood (Routine X 2) w Reflex to ID Panel  Status: None (Preliminary result)   Collection Time: 02/16/23  4:30 PM   Specimen: BLOOD  Result Value Ref Range Status   Specimen Description BLOOD RIGHT ANTECUBITAL  Final   Special Requests   Final    BOTTLES DRAWN AEROBIC AND ANAEROBIC Blood Culture adequate volume   Culture   Final    NO GROWTH 2 DAYS Performed at [facility], [address] [place], [place] [address]    Report Status PENDING  Incomplete  Culture, blood (Routine X 2) w Reflex to ID Panel     Status: None (Preliminary result)   Collection Time: 02/16/23  4:33  PM   Specimen: BLOOD  Result Value Ref Range Status   Specimen Description BLOOD BLOOD LEFT HAND  Final   Special Requests   Final    BOTTLES DRAWN AEROBIC AND ANAEROBIC Blood Culture adequate volume   Culture   Final    NO GROWTH 2 DAYS Performed at [facility], [address] [place], [place] [address]    Report Status PENDING  Incomplete  SARS Coronavirus 2 by RT PCR (hospital order, performed in [facility] hospital lab) *cepheid single result test* Anterior Nasal Swab     Status: None   Collection Time: 02/16/23  5:45 PM   Specimen: Anterior Nasal Swab  Result Value Ref Range Status   SARS Coronavirus 2 by RT PCR NEGATIVE NEGATIVE Final    Comment: (NOTE) SARS-CoV-2 target nucleic acids are NOT DETECTED.  The SARS-CoV-2 RNA is generally detectable in upper and lower respiratory specimens during the acute phase of infection. The lowest concentration of SARS-CoV-2 viral copies this assay can detect is 250 copies / mL. A negative result does not preclude SARS-CoV-2 infection and should not be used as the sole basis for treatment or other patient management decisions.  A negative result may occur with improper specimen collection / handling, submission of specimen other than nasopharyngeal swab, presence of viral mutation(s) within the areas targeted by this assay, and inadequate number of viral copies (<250 copies / mL). A negative result must be combined with clinical observations, patient history, and epidemiological information.  Fact Sheet for Patients:   RoadLapTop.co.za  Fact Sheet for Healthcare Providers: http://kim-miller.com/  This test is not yet approved or  cleared by the [place] FDA and has been authorized for detection and/or diagnosis of SARS-CoV-2 by FDA under an Emergency Use Authorization (EUA).  This EUA will remain in effect (meaning this test can be used) for the duration of the COVID-19  declaration under Section 564(b)(1) of the Act, 21 U.S.C. section 360bbb-3(b)(1), unless the authorization is terminated or revoked sooner.  Performed at [facility], [address] [place], [place] [address]     Coagulation Studies: No results for input(s): "LABPROT", "INR" in the last 72 hours.  Urinalysis: No results for input(s): "COLORURINE", "LABSPEC", "PHURINE", "GLUCOSEU", "HGBUR", "BILIRUBINUR", "KETONESUR", "PROTEINUR", "UROBILINOGEN", "NITRITE", "LEUKOCYTESUR" in the last 72 hours.  Invalid input(s): "APPERANCEUR"     Imaging: DG Chest Port 1 View  Result Date: 02/16/2023 CLINICAL DATA:  Fever of unknown origin, history of multiple myeloma EXAM: PORTABLE CHEST 1 VIEW COMPARISON:  02/10/2023, 02/12/2023 FINDINGS: Single frontal view of the chest demonstrates stable enlargement the cardiac silhouette. Chronic vascular congestion. Nodular areas of consolidation are again seen bilaterally, unchanged since recent PET scan. There is a new peripheral area of wedge-shaped consolidation within the left midlung, measuring up to 6.3 cm. Small left pleural effusion. No pneumothorax. Postsurgical changes from  thoracic fusion. Destructive lesions of the midthoracic spine are not well visualized on this x-ray. IMPRESSION: 1. New peripheral area of wedge-shaped consolidation within the left mid lung, most consistent with focal pneumonia or pulmonary infarct given rapid development since recent PET CT. 2. Small left pleural effusion. 3. Nodular areas of consolidation bilaterally, which may be inflammatory or infectious. No change since recent PET scan. 4. Chronic vascular congestion. Electronically Signed   By: Sharlet Salina M.D.   On: 02/16/2023 20:29     Medications:    sodium chloride     famotidine (PEPCID) IV      allopurinol  100 mg Oral Daily   amLODipine  10 mg Oral Daily   apixaban  10 mg Oral BID   Followed by   Melene Muller ON 02/24/2023] apixaban  5 mg Oral BID    bisacodyl  10 mg Oral Daily   bortezomib SQ  1.3 mg/m2 (Treatment Plan Recorded) Subcutaneous Once   Chlorhexidine Gluconate Cloth  6 each Topical Daily   clonazePAM  1 mg Oral BID   cyclophosphamide  500 mg/m2 (Treatment Plan Recorded) Intravenous Once   docusate sodium  100 mg Oral BID   escitalopram  10 mg Oral Daily   fentaNYL  1 patch Transdermal Q72H   gabapentin  100 mg Oral BID   levothyroxine  250 mcg Oral QAC breakfast   loratadine  10 mg Oral Daily   magnesium gluconate  500 mg Oral Daily   mometasone-formoterol  2 puff Inhalation BID   pantoprazole  40 mg Oral Daily   polyethylene glycol  17 g Oral Daily   sodium bicarbonate  650 mg Oral TID   umeclidinium bromide  1 puff Inhalation Daily   sodium chloride, acetaminophen, albuterol, albuterol, alteplase, butalbital-acetaminophen-caffeine, cyclobenzaprine, diphenhydrAMINE, EPINEPHrine, famotidine (PEPCID) IV, heparin lock flush, heparin lock flush, hydrALAZINE, HYDROmorphone, methylPREDNISolone sodium succinate, sodium chloride flush, sodium chloride flush, sodium chloride flush, sodium chloride flush, zolpidem  Assessment/ Plan:  Ms. DUAA BEVERIDGE is a 57 y.o.  female with past medical history hypertension, depression, anxiety, tobacco, and recent diagnosis of multiple myeloma, who was admitted to [facility] on 02/10/2023 for AKI (acute kidney injury) (HCC) [N17.9]   Acute kidney injury appears to multifactorial. Admits to NSAID use and recent diagnosis of multiple myeloma. Differential diagnosis includes myeloma nephropathy. Renal ultrasound negative for hydronephrosis.  Creatinine continues to improve. Reports poor oral intake since admission. No acute need for dialysis. Will continue to monitor.    Lab Results  Component Value Date   CREATININE 1.92 (H) 02/18/2023   CREATININE 2.12 (H) 02/17/2023   CREATININE 2.77 (H) 02/16/2023    Intake/Output Summary (Last 24 hours) at 02/18/2023 1123 Last data filed at 02/18/2023  1115 Gross per 24 hour  Intake 353.68 ml  Output 900 ml  Net -546.32 ml    2. Hyperkalemia, likely secondary to kidney injury. Potassium 3.9   3. Acute metabolic acidosis, S bicarb 19 on admission. Improved . Oral supplementation ordered    4. Multiple myeloma diagnosed with bone marrow biopsy on 02/05/23. Following Dr Smith Robert outpatient. Chemotherapy and pain control per oncology team.   5.  Hypomagnesemia.  Corrected with supplementation.     LOS: 8   9/3/202411:23 AM

## 2023-02-18 NOTE — Progress Notes (Signed)
 Patient sent for procedure via bed in stable condition.

## 2023-02-18 NOTE — Progress Notes (Signed)
Pt received from procedure via bed in stable condition.

## 2023-02-18 NOTE — Plan of Care (Signed)
  Problem: Activity: Goal: Ability to avoid complications of mobility impairment will improve Outcome: Progressing Goal: Ability to tolerate increased activity will improve Outcome: Progressing Goal: Will remain free from falls Outcome: Progressing   Problem: Clinical Measurements: Goal: Ability to maintain clinical measurements within normal limits will improve Outcome: Progressing Goal: Postoperative complications will be avoided or minimized Outcome: Progressing Goal: Diagnostic test results will improve Outcome: Progressing   Problem: Clinical Measurements: Goal: Ability to maintain clinical measurements within normal limits will improve Outcome: Progressing Goal: Will remain free from infection Outcome: Progressing Goal: Diagnostic test results will improve Outcome: Progressing Goal: Respiratory complications will improve Outcome: Progressing Goal: Cardiovascular complication will be avoided Outcome: Progressing   Problem: Activity: Goal: Risk for activity intolerance will decrease Outcome: Progressing   Problem: Elimination: Goal: Will not experience complications related to bowel motility Outcome: Progressing Goal: Will not experience complications related to urinary retention Outcome: Progressing   Problem: Pain Managment: Goal: General experience of comfort will improve Outcome: Progressing   Problem: Safety: Goal: Ability to remain free from injury will improve Outcome: Progressing   Problem: Skin Integrity: Goal: Risk for impaired skin integrity will decrease Outcome: Progressing

## 2023-02-18 NOTE — Progress Notes (Signed)
Palliative Medicine [facility] at [facility] [phone] [phone]   Name: Paula Garrett Date: 02/18/2023 MRN: [id number]  DOB: Oct 27, 1965  Patient Care Team: Smitty Cords, DO as PCP - General (Family Medicine) Lemar Livings, Merrily Pew, MD (General Surgery) Shelia Media, MD (Internal Medicine) Creig Hines, MD as Consulting Physician (Oncology)    REASON FOR CONSULTATION: Paula Garrett is a 57 y.o. female with multiple medical problems including hypertension, depression, anxiety, history of tobacco abuse, and multiple myeloma, who was hospitalized 01/24/2023 to 02/06/2023 with chest pain and shortness of breath and was found to have a large soft tissue mass in the left T6-T7 paraspinal area with destruction of seventh rib and invasion of T6 and T7 vertebral bodies.  Patient underwent surgery on 8/13 with postop period complicated by pain.  Pathology positive for multiple myeloma.  Patient now readmitted 02/10/2023 with AKI.  She was referred to palliative care to address goals and manage ongoing symptoms.   CODE STATUS: Full code  PAST MEDICAL HISTORY: Past Medical History:  Diagnosis Date   Anxiety    Arthritis    Asthma    Barrett's esophagus 2015   COVID-19    03/10/20   Depression    GERD (gastroesophageal reflux disease)    Hypertension    Hypothyroidism    Pneumonia    Smoldering myeloma    Thyroid disease     PAST SURGICAL HISTORY:  Past Surgical History:  Procedure Laterality Date   ABLATION  2006   APPLICATION OF INTRAOPERATIVE CT SCAN N/A 01/28/2023   Procedure: APPLICATION OF INTRAOPERATIVE CT SCAN;  Surgeon: Loreen Freud, MD;  Location: [facility] ORS;  Service: Neurosurgery;  Laterality: N/A;   BREAST CYST ASPIRATION Left 1998   BREAST SURGERY     cyst aspiration   BREAST SURGERY  1998   cyst aspiration   COLONOSCOPY  03/2014   COLONOSCOPY WITH PROPOFOL N/A 01/06/2020   Procedure: COLONOSCOPY  WITH PROPOFOL;  Surgeon: Toledo, Boykin Nearing, MD;  Location: [facility] ENDOSCOPY;  Service: Gastroenterology;  Laterality: N/A;   ENDOMETRIAL ABLATION     ESOPHAGOGASTRODUODENOSCOPY (EGD) WITH PROPOFOL N/A 08/15/2015   Procedure: ESOPHAGOGASTRODUODENOSCOPY (EGD) WITH PROPOFOL;  Surgeon: Christena Deem, MD;  Location: [facility] ENDOSCOPY;  Service: Endoscopy;  Laterality: N/A;   ESOPHAGOGASTRODUODENOSCOPY (EGD) WITH PROPOFOL N/A 01/22/2016   Procedure: ESOPHAGOGASTRODUODENOSCOPY (EGD) WITH PROPOFOL;  Surgeon: Christena Deem, MD;  Location: [facility] ENDOSCOPY;  Service: Endoscopy;  Laterality: N/A;   ESOPHAGOGASTRODUODENOSCOPY (EGD) WITH PROPOFOL N/A 01/06/2020   Procedure: ESOPHAGOGASTRODUODENOSCOPY (EGD) WITH PROPOFOL;  Surgeon: Toledo, Boykin Nearing, MD;  Location: [facility] ENDOSCOPY;  Service: Gastroenterology;  Laterality: N/A;   GASTRIC BYPASS  2005   HERNIA REPAIR     IR BONE MARROW BIOPSY & ASPIRATION  02/05/2023   NASAL SINUS SURGERY     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   TOTAL THYROIDECTOMY     TUBAL LIGATION      HEMATOLOGY/ONCOLOGY HISTORY:  Oncology History  Multiple myeloma (HCC)  01/25/2023 Initial Diagnosis   Multiple myeloma (HCC)   02/11/2023 -  Chemotherapy   Patient is on Treatment Plan : MYELOMA NEWLY DIAGNOSED TRANSPLANT CANDIDATE Cyclophosphamide IV + Bortezomib SQ + Dexamethasone (CyBorD) q21d x 4 cycles       ALLERGIES:  is allergic to hctz [hydrochlorothiazide].  MEDICATIONS:  Current Facility-Administered Medications  Medication Dose Route Frequency Provider Last Rate Last Admin   0.9 %  sodium chloride infusion  Intravenous Once PRN Creig Hines, MD       acetaminophen (TYLENOL) tablet 1,000 mg  1,000 mg Oral TID PRN Darlin Priestly, MD   1,000 mg at 02/16/23 1629   albuterol (PROVENTIL) (2.5 MG/3ML) 0.083% nebulizer solution 2.5 mg  2.5 mg Nebulization Once PRN Creig Hines, MD       albuterol (PROVENTIL) (2.5 MG/3ML) 0.083% nebulizer solution 3 mL  3 mL Inhalation Q6H  PRN Mikey College T, MD   3 mL at 02/17/23 1036   allopurinol (ZYLOPRIM) tablet 100 mg  100 mg Oral Daily Creig Hines, MD   100 mg at 02/18/23 0812   alteplase (CATHFLO ACTIVASE) injection 2 mg  2 mg Intracatheter Once PRN Creig Hines, MD       amLODipine (NORVASC) tablet 10 mg  10 mg Oral Daily Darlin Priestly, MD   10 mg at 02/18/23 0812   amoxicillin-clavulanate (AUGMENTIN) 875-125 MG per tablet 1 tablet  1 tablet Oral Q12H Darlin Priestly, MD   1 tablet at 02/18/23 1329   apixaban (ELIQUIS) tablet 10 mg  10 mg Oral BID Lowella Bandy, RPH   10 mg at 02/18/23 4098   Followed by   Melene Muller ON 02/24/2023] apixaban (ELIQUIS) tablet 5 mg  5 mg Oral BID Lowella Bandy, RPH       bisacodyl (DULCOLAX) EC tablet 10 mg  10 mg Oral Daily Enedina Finner, MD   10 mg at 02/15/23 1024   butalbital-acetaminophen-caffeine (FIORICET) 50-325-40 MG per tablet 1 tablet  1 tablet Oral Q4H PRN Andris Baumann, MD   1 tablet at 02/18/23 1191   Chlorhexidine Gluconate Cloth 2 % PADS 6 each  6 each Topical Daily Enedina Finner, MD   6 each at 02/18/23 1102   clonazePAM (KLONOPIN) tablet 1 mg  1 mg Oral BID Darlin Priestly, MD   1 mg at 02/18/23 4782   cyclobenzaprine (FLEXERIL) tablet 5-10 mg  5-10 mg Oral TID PRN Mikey College T, MD   10 mg at 02/17/23 9562   diphenhydrAMINE (BENADRYL) injection 50 mg  50 mg Intravenous Once PRN Creig Hines, MD       docusate sodium (COLACE) capsule 100 mg  100 mg Oral BID Mikey College T, MD   100 mg at 02/17/23 2019   EPINEPHrine (EPI-PEN) injection 0.3 mg  0.3 mg Intramuscular Once PRN Creig Hines, MD       escitalopram (LEXAPRO) tablet 10 mg  10 mg Oral Daily Mikey College T, MD   10 mg at 02/18/23 0813   famotidine (PEPCID) IVPB 20 mg premix  20 mg Intravenous Once PRN Creig Hines, MD       fentaNYL (DURAGESIC) 25 MCG/HR 1 patch  1 patch Transdermal Q72H Darlin Priestly, MD   1 patch at 02/15/23 1600   gabapentin (NEURONTIN) capsule 100 mg  100 mg Oral BID Mikey College T, MD   100 mg at 02/18/23 0813    heparin lock flush 100 unit/mL  500 Units Intracatheter Once PRN Creig Hines, MD       heparin lock flush 100 unit/mL  250 Units Intracatheter Once PRN Creig Hines, MD       hydrALAZINE (APRESOLINE) injection 10 mg  10 mg Intravenous Q6H PRN Darlin Priestly, MD   10 mg at 02/16/23 1628   HYDROmorphone (DILAUDID) tablet 1-2 mg  1-2 mg Oral Q4H PRN Darlin Priestly, MD   2 mg at 02/18/23 1230   [START  ON 02/19/2023] influenza vac split trivalent PF (FLULAVAL) injection 0.5 mL  0.5 mL Intramuscular Tomorrow-1000 Darlin Priestly, MD       levothyroxine (SYNTHROID) tablet 250 mcg  250 mcg Oral QAC breakfast Mikey College T, MD   250 mcg at 02/18/23 0558   loratadine (CLARITIN) tablet 10 mg  10 mg Oral Daily Mikey College T, MD   10 mg at 02/18/23 0813   magnesium gluconate (MAGONATE) tablet 500 mg  500 mg Oral Daily Mosetta Pigeon, MD   500 mg at 02/18/23 0827   methylPREDNISolone sodium succinate (SOLU-MEDROL) 125 mg/2 mL injection 125 mg  125 mg Intravenous Once PRN Creig Hines, MD       mometasone-formoterol [facility]) 200-5 MCG/ACT inhaler 2 puff  2 puff Inhalation BID Mikey College T, MD   2 puff at 02/18/23 0815   pantoprazole (PROTONIX) EC tablet 40 mg  40 mg Oral Daily Mikey College T, MD   40 mg at 02/18/23 0811   polyethylene glycol (MIRALAX / GLYCOLAX) packet 17 g  17 g Oral Daily Dora Simeone, Daryl Eastern, NP   17 g at 02/14/23 1884   sodium bicarbonate tablet 650 mg  650 mg Oral TID Lamont Dowdy, MD   650 mg at 02/18/23 0812   sodium chloride flush (NS) 0.9 % injection 10 mL  10 mL Intracatheter PRN Creig Hines, MD       sodium chloride flush (NS) 0.9 % injection 10 mL  10 mL Intracatheter PRN Creig Hines, MD       sodium chloride flush (NS) 0.9 % injection 3 mL  3 mL Intracatheter PRN Creig Hines, MD       sodium chloride flush (NS) 0.9 % injection 3 mL  3 mL Intracatheter PRN Creig Hines, MD       umeclidinium bromide (INCRUSE ELLIPTA) 62.5 MCG/ACT 1 puff  1 puff Inhalation Daily Mikey College T, MD   1  puff at 02/18/23 0815   zolpidem (AMBIEN) tablet 5-10 mg  5-10 mg Oral QHS PRN Mikey College T, MD   10 mg at 02/14/23 0041    VITAL SIGNS: BP 128/65 (BP Location: Left Arm)   Pulse 90   Temp 98.6 F (37 C) (Oral)   Resp 20   Ht 5\' 2"  (1.575 m)   Wt 195 lb (88.5 kg)   SpO2 96%   BMI 35.67 kg/m  Filed Weights   02/10/23 1805 02/12/23 1053  Weight: 180 lb (81.6 kg) 195 lb (88.5 kg)    Estimated body mass index is 35.67 kg/m as calculated from the following:   Height as of this encounter: 5\' 2"  (1.575 m).   Weight as of this encounter: 195 lb (88.5 kg).  LABS: CBC:    Component Value Date/Time   WBC 8.8 02/18/2023 0533   HGB 7.4 (L) 02/18/2023 0533   HGB 7.4 (L) 02/10/2023 2009   HCT 23.1 (L) 02/18/2023 0533   HCT 38.5 07/02/2011 1529   PLT 305 02/18/2023 0533   PLT 311 07/02/2011 1529   MCV 95.5 02/18/2023 0533   MCV 90 07/02/2011 1529   NEUTROABS 10.4 (H) 02/10/2023 0906   LYMPHSABS 2.7 02/10/2023 0906   MONOABS 1.0 02/10/2023 0906   EOSABS 0.1 02/10/2023 0906   BASOSABS 0.0 02/10/2023 0906   Comprehensive Metabolic Panel:    Component Value Date/Time   NA 133 (L) 02/18/2023 0533   K 3.9 02/18/2023 0533   CL 104 02/18/2023 0533   CO2 21 (  L) 02/18/2023 0533   BUN 33 (H) 02/18/2023 0533   CREATININE 1.92 (H) 02/18/2023 0533   GLUCOSE 107 (H) 02/18/2023 0533   CALCIUM 8.6 (L) 02/18/2023 0533   AST 18 02/10/2023 0906   ALT 16 02/10/2023 0906   ALKPHOS 60 02/10/2023 0906   BILITOT 0.3 02/10/2023 0906   PROT 8.2 (H) 02/10/2023 0906   ALBUMIN 2.1 (L) 02/10/2023 0906    RADIOGRAPHIC STUDIES: DG Chest Port 1 View  Result Date: 02/18/2023 CLINICAL DATA:  History of multiple myeloma and pulmonary embolus. EXAM: PORTABLE CHEST 1 VIEW COMPARISON:  February 16, 2023. FINDINGS: Stable cardiomegaly. Continued presence of wedge-shaped consolidation within the left midlung concerning for pneumonia or infarction as noted on prior exam. Mild central pulmonary vascular  congestion is noted. Left basilar atelectasis or infiltrate and effusion cannot be excluded. Postsurgical changes are seen in thoracic spine. IMPRESSION: Continued presence wedge-shaped consolidation with left midlung concerning for pneumonia or infarction as noted on prior exam. Mild central pulmonary vascular congestion. Left basilar atelectasis or infiltrate and pleural effusion cannot be excluded. Electronically Signed   By: Lupita Raider M.D.   On: 02/18/2023 12:43   NM Pulmonary Perfusion  Result Date: 02/18/2023 CLINICAL DATA:  Concern for pulmonary embolism.  Short of breath. EXAM: NUCLEAR MEDICINE PERFUSION LUNG SCAN TECHNIQUE: Perfusion images were obtained in multiple projections after intravenous injection of radiopharmaceutical. RADIOPHARMACEUTICALS:  4.5 mCi Tc-34m MAA COMPARISON:  Chest radiograph 02/18/2023 FINDINGS: Marked decreased relative perfusion to the entire LEFT lung is favored related to attenuation artifact from large LEFT effusion seen on comparison radiograph. No wedge-shaped peripheral perfusion defects within LEFT or RIGHT lung to suggest acute pulmonary embolism. IMPRESSION: 1. No evidence acute pulmonary embolism. 2. Decreased relative activity in the LEFT lung is favor related to large LEFT effusion. Electronically Signed   By: Genevive Bi M.D.   On: 02/18/2023 12:40   DG Chest Port 1 View  Result Date: 02/16/2023 CLINICAL DATA:  Fever of unknown origin, history of multiple myeloma EXAM: PORTABLE CHEST 1 VIEW COMPARISON:  02/10/2023, 02/12/2023 FINDINGS: Single frontal view of the chest demonstrates stable enlargement the cardiac silhouette. Chronic vascular congestion. Nodular areas of consolidation are again seen bilaterally, unchanged since recent PET scan. There is a new peripheral area of wedge-shaped consolidation within the left midlung, measuring up to 6.3 cm. Small left pleural effusion. No pneumothorax. Postsurgical changes from thoracic fusion. Destructive  lesions of the midthoracic spine are not well visualized on this x-ray. IMPRESSION: 1. New peripheral area of wedge-shaped consolidation within the left mid lung, most consistent with focal pneumonia or pulmonary infarct given rapid development since recent PET CT. 2. Small left pleural effusion. 3. Nodular areas of consolidation bilaterally, which may be inflammatory or infectious. No change since recent PET scan. 4. Chronic vascular congestion. Electronically Signed   By: Sharlet Salina M.D.   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 EXAM: NUCLEAR MEDICINE PET WHOLE BODY TECHNIQUE: 10.1 mCi F-18 FDG was injected intravenously. Full-ring PET imaging was performed from the head to foot after the radiotracer. CT data was obtained and used for attenuation correction and anatomic localization. Fasting blood glucose: 100 mg/dl COMPARISON:  CT chest 28/41/3244 FINDINGS: Mediastinal blood pool activity: SUV max HEAD/NECK: No hypermetabolic activity in the scalp. No hypermetabolic cervical lymph nodes. Incidental CT findings: none  CHEST: At the LEFT paraspinal resection site (T6-T7), there is residual fluid and soft tissue density tissue measuring 6.1 x 3.7 cm. This tissue has no significant metabolic activity. Activity is equal to background blood pool activity. Posterior fusion at this level. No hypermetabolic tissue within the thorax to suggest malignancy within the bones or soft tissue. There are several small nodules in the LEFT lung apex (image 54/6) without metabolic activity. These are new from comparison CT 01/24/2023 and therefore favored foci of infection or inflammation. Small bilateral pleural effusions are present greater on the LEFT. Incidental CT findings: none ABDOMEN/PELVIS: There is moderate metabolic activity through the GE  junction. There is postsurgical anatomy of the proximal stomach. No abnormal metabolic activity liver. Scattered physiologic activity in the bowel. No hypermetabolic lymph nodes in the abdomen pelvis. Incidental CT findings: Foley catheter in bladder. SKELETON: Hypermetabolic lesion in the L4 vertebral body with SUV max equal 5.4. associated lytic lesion measuring 19 mm on image 121/series 6. EXTREMITIES: No abnormal hypermetabolic activity in the lower extremities. Incidental CT findings: No sclerotic or lytic lesions on CT imaging. IMPRESSION: 1. Solitary hypermetabolic lytic lesion in the L4 vertebral body consistent multiple myeloma. Metabolic activity is moderate. 2. Residual soft tissue density at the LEFT paraspinal resection site (T6-T7) with no significant metabolic activity. Favor benign postsurgical tissue. 3. Small bilateral pleural effusions greater on the LEFT. 4. Small nodules in the LEFT lung apex without metabolic activity are favored foci of infection or inflammation. 5. Moderate metabolic activity through the GE junction is favored physiologic or inflammatory. Electronically Signed   By: Genevive Bi M.D.   On: 02/12/2023 13:47   US RENAL  Result Date: 02/10/2023 CLINICAL DATA:  AKI EXAM: RENAL / URINARY TRACT ULTRASOUND COMPLETE COMPARISON:  None Available. FINDINGS: Right Kidney: Renal measurements: 12.5 cm x 5.6 cm x 4.6 cm = volume: 165 mL. Parenchymal echogenicity is normal. There is no hydronephrosis. Left Kidney: Renal measurements: 12.6 cm x 6.4 cm x 5.0 cm = volume: 213 mL. Parenchymal echogenicity is normal. There is no hydronephrosis. Bladder: Decompressed by a Foley catheter. Other: None. IMPRESSION: Unremarkable renal ultrasound. Electronically Signed   By: Lesia Hausen M.D.   On: 02/10/2023 17:03   DG Chest 1 View  Result Date: 02/10/2023 CLINICAL DATA:  Acute kidney injury. Sodium 120. Elevated creatinine. EXAM: CHEST  1 VIEW COMPARISON:  02/01/2023 FINDINGS: Shallow  inspiration. Cardiac enlargement. Hazy interstitial infiltrates throughout both lungs most likely representing edema although possibly interstitial pneumonia or chronic fibrosis. Small left pleural effusion with basilar atelectasis or consolidation. This could indicate pneumonia in the appropriate clinical setting. No pneumothorax. Postoperative changes in the thoracic spine. No significant change since prior study. IMPRESSION: Cardiac enlargement with probable interstitial pulmonary edema. Small left pleural effusion with basilar atelectasis or consolidation. Similar appearance to previous study. Electronically Signed   By: Burman Nieves M.D.   On: 02/10/2023 15:50   IR BONE MARROW BIOPSY & ASPIRATION  Result Date: 02/05/2023 INDICATION: Hematologic abnormality EXAM: Bone marrow aspiration and core biopsy using fluoroscopic guidance MEDICATIONS: None. ANESTHESIA/SEDATION: Moderate (conscious) sedation was employed during this procedure. A total of Versed 1.5 mg and Fentanyl 75 mcg was administered intravenously. Moderate Sedation Time: 10 minutes. The patient's level of consciousness and vital signs were monitored continuously by radiology nursing throughout the procedure under my direct supervision. FLUOROSCOPY TIME:  Fluoroscopy Time: 0.7 minutes (13 mGy) COMPLICATIONS: None immediate. PROCEDURE: Informed written consent was obtained from the patient after a thorough discussion of  the procedural risks, benefits and alternatives. All questions were addressed. Maximal Sterile Barrier Technique was utilized including caps, mask, sterile gowns, sterile gloves, sterile drape, hand hygiene and skin antiseptic. A timeout was performed prior to the initiation of the procedure. The patient was placed prone on the exam table. Limited fluoroscopy of the pelvis was performed for planning purposes. Skin entry site was marked, and the overlying skin was prepped and draped in the standard sterile fashion. Local analgesia  was obtained with 1% lidocaine. Using fluoroscopic guidance, an 11 gauge needle was advanced just deep to the cortex of the right posterior ilium. Subsequently, bone marrow aspiration and core biopsy were performed. Specimens were submitted to lab/pathology for handling. Hemostasis was achieved with manual pressure, and a clean dressing was placed. The patient tolerated the procedure well without immediate complication. IMPRESSION: Successful bone marrow aspiration and core biopsy of the right posterior ilium. Electronically Signed   By: Olive Bass M.D.   On: 02/05/2023 15:35   CT HEAD WO CONTRAST ( )  Result Date: 02/03/2023 CLINICAL DATA:  Mental status change, unknown cause. Acute hypoxic respiratory failure EXAM: CT HEAD WITHOUT CONTRAST TECHNIQUE: Contiguous axial images were obtained from the base of the skull through the vertex without intravenous contrast. RADIATION DOSE REDUCTION: This exam was performed according to the departmental dose-optimization program which includes automated exposure control, adjustment of the mA and/or kV according to patient size and/or use of iterative reconstruction technique. COMPARISON:  Bone survey 10/09/2022. FINDINGS: Brain: No acute intracranial abnormality. Specifically, no hemorrhage, hydrocephalus, mass lesion, acute infarction, or significant intracranial injury. Vascular: No hyperdense vessel or unexpected calcification. Skull: Focal lucent lesions throughout the skull. 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 These lytic lesions were not in the skull on the skull x-ray from 10/09/2022. Appearance is concerning for possible multiple myeloma. No fracture. Sinuses/Orbits: No acute findings Other: None IMPRESSION: No acute intracranial abnormality. Numerous scattered focal lucent lesions throughout the skull. These are new since prior bone survey from 10/09/2022 and concerning for multiple myeloma. Electronically Signed   By:  Charlett Nose M.D.   On: 02/03/2023 16:30   DG Chest Port 1 View  Result Date: 02/01/2023 CLINICAL DATA:  Hypoxia EXAM: PORTABLE CHEST 1 VIEW COMPARISON:  01/30/2023 FINDINGS: Bilateral diffuse interstitial thickening and patchy alveolar airspace opacities at the right lung base and left mid lower lung. No pleural effusion or pneumothorax. Stable cardiomediastinal silhouette. No acute osseous abnormality. Posterior spinal fusion of the upper and midthoracic spine. IMPRESSION: 1. Bilateral diffuse interstitial thickening and patchy alveolar airspace opacities at the right lung base and left mid lower lung concerning for pulmonary edema. Electronically Signed   By: Elige Ko M.D.   On: 02/01/2023 10:13   ECHOCARDIOGRAM COMPLETE  Result Date: 01/31/2023    ECHOCARDIOGRAM REPORT   Patient Name:   DERIONA GALATI Date of Exam: 01/31/2023 Medical Rec #:  [id number]      Height:       62.0 in Accession #:    [id number]     Weight:       170.0 lb Date of Birth:  1966/04/06      BSA:          1.784 m Patient Age:    57 years       BP:           106/57 mmHg Patient Gender: F              HR:  110 bpm. Exam Location:  [facility] Procedure: 2D Echo, Cardiac Doppler and Color Doppler Indications:     Dyspnea  History:         Patient has no prior history of Echocardiogram examinations.                  Signs/Symptoms:Dyspnea, Chest Pain, Fatigue and Shortness of                  Breath; Risk Factors:Hypertension, Sleep Apnea and Current                  Smoker.  Sonographer:     Mikki Harbor Referring Phys:  [id number] Alford Highland Diagnosing Phys: Julien Nordmann MD  Sonographer Comments: Image acquisition challenging due to respiratory motion. IMPRESSIONS  1. Left ventricular ejection fraction, by estimation, is 60 to 65%. The left ventricle has normal function. The left ventricle has no regional wall motion abnormalities. There is mild left ventricular hypertrophy. Left ventricular diastolic parameters are  consistent with Grade I diastolic dysfunction (impaired relaxation).  2. Right ventricular systolic function is normal. The right ventricular size is normal. There is normal pulmonary artery systolic pressure. The estimated right ventricular systolic pressure is 30.9 mmHg.  3. Left atrial size was mildly dilated.  4. The mitral valve is normal in structure. No evidence of mitral valve regurgitation. No evidence of mitral stenosis.  5. The aortic valve is tricuspid. Aortic valve regurgitation is not visualized. No aortic stenosis is present.  6. The inferior vena cava is normal in size with greater than 50% respiratory variability, suggesting right atrial pressure of 3 mmHg. FINDINGS  Left Ventricle: Left ventricular ejection fraction, by estimation, is 60 to 65%. The left ventricle has normal function. The left ventricle has no regional wall motion abnormalities. The left ventricular internal cavity size was normal in size. There is  mild left ventricular hypertrophy. Left ventricular diastolic parameters are consistent with Grade I diastolic dysfunction (impaired relaxation). Right Ventricle: The right ventricular size is normal. No increase in right ventricular wall thickness. Right ventricular systolic function is normal. There is normal pulmonary artery systolic pressure. The tricuspid regurgitant velocity is 2.64 m/s, and  with an assumed right atrial pressure of 3 mmHg, the estimated right ventricular systolic pressure is 30.9 mmHg. Left Atrium: Left atrial size was mildly dilated. Right Atrium: Right atrial size was normal in size. Pericardium: There is no evidence of pericardial effusion. Mitral Valve: The mitral valve is normal in structure. No evidence of mitral valve regurgitation. No evidence of mitral valve stenosis. MV peak gradient, 8.0 mmHg. The mean mitral valve gradient is 4.0 mmHg. Tricuspid Valve: The tricuspid valve is normal in structure. Tricuspid valve regurgitation is mild . No evidence of  tricuspid stenosis. Aortic Valve: The aortic valve is tricuspid. Aortic valve regurgitation is not visualized. No aortic stenosis is present. Aortic valve mean gradient measures 9.0 mmHg. Aortic valve peak gradient measures 18.4 mmHg. Aortic valve area, by VTI measures 3.04  cm. Pulmonic Valve: The pulmonic valve was normal in structure. Pulmonic valve regurgitation is not visualized. No evidence of pulmonic stenosis. Aorta: The aortic root is normal in size and structure. Venous: The inferior vena cava is normal in size with greater than 50% respiratory variability, suggesting right atrial pressure of 3 mmHg. IAS/Shunts: No atrial level shunt detected by color flow Doppler.  LEFT VENTRICLE PLAX 2D LVIDd:         5.80 cm   Diastology LVIDs:  3.60 cm   LV e' medial:    9.36 cm/s LV PW:         0.90 cm   LV E/e' medial:  9.7 LV IVS:        1.00 cm   LV e' lateral:   10.00 cm/s LVOT diam:     2.20 cm   LV E/e' lateral: 9.1 LV SV:         99 LV SV Index:   55 LVOT Area:     3.80 cm  RIGHT VENTRICLE RV Basal diam:  3.45 cm RV Mid diam:    3.00 cm RV S prime:     27.40 cm/s TAPSE (M-mode): 3.3 cm LEFT ATRIUM             Index        RIGHT ATRIUM           Index LA diam:        4.80 cm 2.69 cm/m   RA Area:     18.90 cm LA Vol (A2C):   59.6 ml 33.41 ml/m  RA Volume:   54.10 ml  30.32 ml/m LA Vol (A4C):   66.9 ml 37.50 ml/m LA Biplane Vol: 65.0 ml 36.43 ml/m  AORTIC VALVE AV Area (Vmax):    3.09 cm AV Area (Vmean):   3.04 cm AV Area (VTI):     3.04 cm AV Vmax:           214.50 cm/s AV Vmean:          136.750 cm/s AV VTI:            0.326 m AV Peak Grad:      18.4 mmHg AV Mean Grad:      9.0 mmHg LVOT Vmax:         174.50 cm/s LVOT Vmean:        109.500 cm/s LVOT VTI:          0.260 m LVOT/AV VTI ratio: 0.80  AORTA Ao Root diam: 3.20 cm MITRAL VALVE                TRICUSPID VALVE MV Area (PHT): 3.72 cm     TR Peak grad:   27.9 mmHg MV Area VTI:   2.90 cm     TR Vmax:        264.00 cm/s MV Peak grad:  8.0  mmHg MV Mean grad:  4.0 mmHg     SHUNTS MV Vmax:       1.41 m/s     Systemic VTI:  0.26 m MV Vmean:      91.6 cm/s    Systemic Diam: 2.20 cm MV Decel Time: 204 msec MV E velocity: 90.70 cm/s MV A velocity: 117.00 cm/s MV E/A ratio:  0.78 Julien Nordmann MD Electronically signed by Julien Nordmann MD Signature Date/Time: 01/31/2023/5:43:31 PM    Final    DG Thoracic Spine 2 View  Result Date: 01/31/2023 CLINICAL DATA:  Back pain. 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 EXAM: THORACIC SPINE 2 VIEWS COMPARISON:  MRI of the cervicothoracic spine 01/24/2023. Intraoperative imaging of the thoracic spine 01/28/2023. Chest CTA 01/24/2023. 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 Counting superiorly from the T12 level, the patient has undergone midthoracic spinal decompression with posterior pedicle screw and rod fusion from T4 through T9. The hardware appears intact and well positioned. Chronic superior endplate compression deformity at T11 is unchanged.  No acute osseous complications are identified. There is mild atelectasis at both lung bases. IMPRESSION: No demonstrated acute findings status post recent midthoracic spinal decompression and fusion. Electronically Signed   By: Carey Bullocks M.D.   On: 01/31/2023 16:27   DG Chest Port 1 View  Result Date: 01/30/2023 CLINICAL DATA:  Cough, shortness of breath EXAM: PORTABLE CHEST 1 VIEW COMPARISON:  01/24/2023 FINDINGS: Cardiomegaly. Diffuse bilateral interstitial pulmonary opacity and trace pleural effusions. Interval thoracic fusion. IMPRESSION: Cardiomegaly with diffuse bilateral interstitial pulmonary opacity and trace pleural effusions, most consistent with edema. Electronically Signed   By: Jearld Lesch M.D.   On: 01/30/2023 14:07   DG Thoracic Spine 2 View  Result Date: 01/28/2023 CLINICAL DATA:  Elective surgery. T4 through T9 fusion of biopsy for lesion with intra 3D  imaging. EXAM: THORACIC SPINE 2 VIEWS COMPARISON:  Preoperative imaging. FINDINGS: Intraoperative 3D imaging of the thoracic spine obtained. Posterior rod with pedicle screws at multiple levels. Known paravertebral mass better assessed on preoperative imaging. No fluoroscopic spot views provided. Fluoroscopy time 1 minutes 30 seconds. Dose is 97.045 mGy IMPRESSION: Intraoperative 3D imaging of the thoracic spine. Electronically Signed   By: Narda Rutherford M.D.   On: 01/28/2023 16:28   DG C-Arm 1-60 Min-No Report  Result Date: 01/28/2023 Fluoroscopy was utilized by the requesting physician.  No radiographic interpretation.   DG C-Arm 1-60 Min-No Report  Result Date: 01/28/2023 Fluoroscopy was utilized by the requesting physician.  No radiographic interpretation.   DG C-Arm 1-60 Min-No Report  Result Date: 01/28/2023 Fluoroscopy was utilized by the requesting physician.  No radiographic interpretation.   DG C-Arm 1-60 Min-No Report  Result Date: 01/28/2023 Fluoroscopy was utilized by the requesting physician.  No radiographic interpretation.   MR THORACIC SPINE W WO CONTRAST  Result Date: 01/24/2023 CLINICAL DATA:  Soft tissue mass with bony destruction involving the left seventh rib and T6 and T7 vertebral bodies. EXAM: MRI CERVICAL AND THORACIC SPINE WITHOUT AND WITH CONTRAST TECHNIQUE: Multiplanar and multiecho pulse sequences of the cervical spine, to include the craniocervical junction and cervicothoracic junction, and the thoracic spine, were obtained without and with intravenous contrast. CONTRAST:  7mL GADAVIST GADOBUTROL 1 MMOL/ML IV SOLN COMPARISON:  Same day CTA chest, cervical and thoracic spine MRI 07/20/2020 FINDINGS: MRI CERVICAL SPINE FINDINGS Alignment: There is trace retrolisthesis of C3 on C4 and trace anterolisthesis of C5 on C6. Vertebrae: Vertebral body heights are preserved. Background marrow signal is normal. A T1 hypointense/T2 hyperintense lesion in the clivus is  unchanged since 2022, favored benign. There is no suspicious marrow signal abnormality or marrow edema in the cervical spine. There is no abnormal marrow enhancement in the cervical spine. There is a 6 mm enhancing lesion in the right occipital calvarium which was not seen on the prior cervical spine MRI from 2022; however, this area may not has been included within the field of view. Cord: Normal in signal and morphology. Posterior Fossa, vertebral arteries, paraspinal tissues: The imaged posterior fossa is unremarkable. The vertebral artery flow voids are normal. The paraspinal soft tissues are unremarkable. Disc levels: There is overall mild degenerative change in the cervical spine without significant spinal canal or neural foraminal stenosis. MRI THORACIC SPINE FINDINGS Alignment:  Normal. Vertebrae: Background marrow signal is normal. There is infiltrative T1 hypointensity occupying most of the T6 and T7 vertebral bodies extending into the left pedicles and facets. There is a bulky soft tissue mass in the adjacent left paraspinal/subpleural soft tissues with destruction  of the seventh rib. The bulk of the soft tissue component measures up to proximally 7.2 cm x 3.4 cm x 2.7 cm. 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 There is mild loss of vertebral body height at both levels consistent with associated pathologic fractures. There is compression deformity of the T11 vertebral body with up to approximately 30% loss of vertebral body height anteriorly and minimal bony retropulsion. There is faint edema along the superior endplate consistent with acute to subacute chronicity. This fracture may be pathologic. There are probable additional lesions involving multiple additional ribs  (for example 25-21, 25-24). The above findings are new since the MRI from 2022. Cord:  There is no cord signal abnormality or abnormal enhancement. Paraspinal and other soft tissues: Unremarkable, aside from the bulky paraspinal tumor described above. Disc levels: There is a small disc protrusion at T10-T11 without significant spinal canal or neural foraminal stenosis. Otherwise, there is overall minimal background degenerative change without significant spinal canal or neural foraminal stenosis. IMPRESSION: 1. Large soft tissue mass in the left paraspinal soft tissues at T6-T7 with destruction of the seventh rib and invasion of the T6 and T7 vertebral bodies with involvement of the left posterior elements as well as epidural tumor in the spinal canal resulting in moderate spinal canal stenosis without frank cord compression or cord edema. 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 2. Acute to subacute compression fracture of the T11 vertebral body with up to approximately 30% loss of vertebral body height and minimal bony retropulsion, possibly pathologic. 3. Small enhancing lesion in the right occipital calvarium, not definitely present on the prior cervical spine from 2022 (though possibly not included within the field of view on that study), suspicious for an additional metastatic or myeloma lesion. 4. Additional probable lesions involving multiple additional ribs. 5. No acute or suspicious finding in the cervical spine. Electronically Signed   By: Lesia Hausen M.D.   On: 01/24/2023 14:33   MR Cervical Spine W and Wo Contrast  Result Date: 01/24/2023 CLINICAL DATA:  Soft tissue mass with bony destruction involving the left seventh rib and T6 and T7 vertebral bodies. EXAM: MRI CERVICAL AND THORACIC SPINE WITHOUT AND WITH CONTRAST TECHNIQUE: Multiplanar and multiecho pulse sequences of the cervical spine, to include the craniocervical junction and  cervicothoracic junction, and the thoracic spine, were obtained without and with intravenous contrast. CONTRAST:  7mL GADAVIST GADOBUTROL 1 MMOL/ML IV SOLN COMPARISON:  Same day CTA chest, cervical and thoracic spine MRI 07/20/2020 FINDINGS: MRI CERVICAL SPINE FINDINGS Alignment: There is trace retrolisthesis of C3 on C4 and trace anterolisthesis of C5 on C6. Vertebrae: Vertebral body heights are preserved. Background marrow signal is normal. A T1 hypointense/T2 hyperintense lesion in the clivus is unchanged since 2022, favored benign. There is no suspicious marrow signal abnormality or marrow edema in the cervical spine. There is no abnormal marrow enhancement in the cervical spine. There is a 6 mm enhancing lesion in the right occipital calvarium which was not seen on the prior cervical spine MRI from 2022; however, this area may not has been included within the field of view. Cord: Normal in signal and morphology. Posterior Fossa, vertebral arteries, paraspinal tissues: The imaged posterior fossa is unremarkable. The vertebral artery  flow voids are normal. The paraspinal soft tissues are unremarkable. Disc levels: There is overall mild degenerative change in the cervical spine without significant spinal canal or neural foraminal stenosis. MRI THORACIC SPINE FINDINGS Alignment:  Normal. Vertebrae: Background marrow signal is normal. There is infiltrative T1 hypointensity occupying most of the T6 and T7 vertebral bodies extending into the left pedicles and facets. There is a bulky soft tissue mass in the adjacent left paraspinal/subpleural soft tissues with destruction of the seventh rib. The bulk of the soft tissue component measures up to proximally 7.2 cm x 3.4 cm x 2.7 cm. There is extension into the T6-T7 and T7-T8 neural foramina as well as epidural tumor along the ventral and left dorsal lateral aspects of the spinal canal resulting in moderate spinal canal stenosis with effacement of the thecal sac but no  frank cord compression or cord edema. There is mild loss of vertebral body height at both levels consistent with associated pathologic fractures. There is compression deformity of the T11 vertebral body with up to approximately 30% loss of vertebral body height anteriorly and minimal bony retropulsion. There is faint edema along the superior endplate consistent with acute to subacute chronicity. This fracture may be pathologic. There are probable additional lesions involving multiple additional ribs (for example 25-21, 25-24). The above findings are new since the MRI from 2022. Cord:  There is no cord signal abnormality or abnormal enhancement. Paraspinal and other soft tissues: Unremarkable, aside from the bulky paraspinal tumor described above. Disc levels: There is a small disc protrusion at T10-T11 without significant spinal canal or neural foraminal stenosis. Otherwise, there is overall minimal background degenerative change without significant spinal canal or neural foraminal stenosis. IMPRESSION: 1. Large soft tissue mass in the left paraspinal soft tissues at T6-T7 with destruction of the seventh rib and invasion of the T6 and T7 vertebral bodies with involvement of the left posterior elements as well as epidural tumor in the spinal canal resulting in moderate spinal canal stenosis without frank cord compression or cord edema. 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 2. Acute to subacute compression fracture of the T11 vertebral body with up to approximately 30% loss of vertebral body height and minimal bony retropulsion, possibly pathologic. 3. Small enhancing lesion in the right occipital calvarium, not definitely present on the prior cervical spine from 2022 (though possibly not included within the field of view on that study), suspicious for an additional metastatic or myeloma lesion. 4. Additional probable lesions involving multiple additional  ribs. 5. No acute or suspicious finding in the cervical spine. Electronically Signed   By: Lesia Hausen M.D.   On: 01/24/2023 14:33   CT Angio Chest PE W and/or Wo Contrast  Result Date: 01/24/2023 CLINICAL DATA:  Chest pain and shortness of breath for a month. EXAM: CT ANGIOGRAPHY CHEST WITH CONTRAST TECHNIQUE: Multidetector CT imaging of the chest was performed using the standard protocol during bolus administration of intravenous contrast. Multiplanar CT image reconstructions and MIPs were obtained to evaluate the vascular anatomy. RADIATION DOSE REDUCTION: This exam was performed according to the departmental dose-optimization program which includes automated exposure control, adjustment of the mA and/or kV according to patient size and/or use of iterative reconstruction technique. CONTRAST:  75mL OMNIPAQUE IOHEXOL 350 MG/ML SOLN COMPARISON:  X-ray 01/24/2023 and older.  Previous CT January 2017 FINDINGS: Cardiovascular: The thoracic aorta has a normal course and caliber with minimal calcified plaque. There is a bovine type aortic arch, normal variant.  Coronary artery calcifications are seen. Please correlate for other coronary risk factors. Heart is nonenlarged. There is slight wall thickening of the left ventricle. Prominent fat along the intra-atrial septum. Pulmonary arteries show some slight enlargement centrally. Please correlate for any evidence of pulmonary artery hypertension. There is heterogeneous enhancement of the pulmonary arterial tree limiting evaluation for emboli. No obvious large and central embolus although several areas are nondiagnostic. Mediastinum/Nodes: Surgical changes identified with a moderate hiatal hernia. Small thyroid gland. No specific abnormal lymph node enlargement identified in the axillary region, hilum or mediastinum. There are some small nodes identified in the posterior mediastinum, posterior to the descending thoracic aorta such as series 4, image 86 but not pathologic  by size criteria. Lungs/Pleura: There is some linear opacity lung bases likely scar or atelectasis. No consolidation. Breathing motion identified. No pleural effusion or pneumothorax. 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 Benign hepatic cysts as well. Musculoskeletal: There is large destructive mass involving the posterior aspect of the left seventh rib with extension to involve the central canal of the spine in the associated vertebral bodies of T6 and T7. Cord compression is possible. This mass is estimated in diameter on series 4, image 65 at 8.5 by 4.3 cm. Slight extension along the extrapleural space in the posterior mediastinum as well. There is question of some subtle other lucent lesions identified along the spine but indeterminate. Critical Value/emergent results were called by telephone at the time of interpretation on 01/24/2023 at 8:41 am to provider [facility] , who verbally acknowledged these results. Review of the MIP images confirms the above findings. IMPRESSION: Aggressive soft tissue mass with bone destruction involving the left seventh rib as well as the T6 and T7 vertebral bodies and central canal. A neoplastic process is possible. There also is a possibility of significant cord compression with canal encroachment along the spine. Recommend further evaluation with spine MRI with and without contrast and a whole-body bone scan. There appears to be some other subtle lucent bone lesions along the spine and sternum. Please correlate for any history of known malignancy including such processes as myeloma or other. Poor opacification of the pulmonary arterial tree limiting evaluation. Several areas are non diagnostic for pulmonary emboli. No obvious large and central embolus. Aortic Atherosclerosis (ICD10-I70.0). Electronically Signed   By: Karen Kays M.D.   On: 01/24/2023 11:47   DG Chest 2 View  Result Date: 01/24/2023 CLINICAL  DATA:  Chest pain EXAM: CHEST - 2 VIEW COMPARISON:  Chest x-ray dated January 07, 2023 FINDINGS: The heart size and mediastinal contours are within normal limits. Small hiatal hernia. Both lungs are clear. Pleural-based nodular opacity of the left posterior hemithorax. The visualized skeletal structures are unremarkable. IMPRESSION: Pleural-based nodular opacity of the left posterior hemithorax. Recommend further evaluation with contrast enhanced chest CT. Electronically Signed   By: Allegra Lai M.D.   On: 01/24/2023 09:38    PERFORMANCE STATUS (ECOG) : 1 - Symptomatic but completely ambulatory  Review of Systems Unless otherwise noted, a complete review of systems is negative.  Physical Exam General: NAD Pulmonary: Unlabored Extremities: no edema, no joint deformities Skin: no rashes Neurological: Weakness but otherwise nonfocal  IMPRESSION: Follow-up visit.  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  Patient started on anticoagulation due to concern for PE.  Patient tearful and anxious.  Feels that pain medication is helpful but that she is not getting it when requested.  Patient anxious to  return home and feels that she would be better able to manage her pain in that setting.  Likely depression and anxiety are a factor on patient's perception of pain severity.  She will likely benefit from outpatient counseling.  Would also recommend her have follow-up with her mental health provider.  Note plan for discharge later today.  Will plan outpatient follow-up in the clinic to assess pain.  PLAN: -Continue current scope of treatment -Continue fentanyl/hydromorphone -Recommend outpatient follow-up with mental health provider -Daily bowel regimen -Follow-up telephone visit on Wednesday.  Patient will see Dr. Smith Robert on Friday.   Time Total: 15 minutes  Visit consisted of counseling and education dealing with the complex and emotionally intense issues of symptom  management and palliative care in the setting of serious and potentially life-threatening illness.Greater than 50%  of this time was spent counseling and coordinating care related to the above assessment and plan.  Signed by: Laurette Schimke, PhD, NP-C

## 2023-02-18 NOTE — Progress Notes (Signed)
   Patient Saturations on Room Air at Rest = 90%  Patient Saturations on [employer] while Ambulating = 79%  Patient Saturations on 2 Liters of oxygen while Ambulating = 91%

## 2023-02-18 NOTE — Plan of Care (Signed)

## 2023-02-19 ENCOUNTER — Inpatient Hospital Stay: Payer: Managed Care, Other (non HMO) | Admitting: Hospice and Palliative Medicine

## 2023-02-19 DIAGNOSIS — N179 Acute kidney failure, unspecified: Secondary | ICD-10-CM | POA: Diagnosis not present

## 2023-02-19 LAB — CBC
HCT: 21.3 % — ABNORMAL LOW (ref 36.0–46.0)
Hemoglobin: 7.1 g/dL — ABNORMAL LOW (ref 12.0–15.0)
MCH: 31 pg (ref 26.0–34.0)
MCHC: 33.3 g/dL (ref 30.0–36.0)
MCV: 93 fL (ref 80.0–100.0)
Platelets: 341 10*3/uL (ref 150–400)
RBC: 2.29 MIL/uL — ABNORMAL LOW (ref 3.87–5.11)
RDW: 14.1 % (ref 11.5–15.5)
WBC: 7.8 10*3/uL (ref 4.0–10.5)
nRBC: 0 % (ref 0.0–0.2)

## 2023-02-19 LAB — BASIC METABOLIC PANEL
Anion gap: 8 (ref 5–15)
BUN: 29 mg/dL — ABNORMAL HIGH (ref 6–20)
CO2: 21 mmol/L — ABNORMAL LOW (ref 22–32)
Calcium: 8.5 mg/dL — ABNORMAL LOW (ref 8.9–10.3)
Chloride: 104 mmol/L (ref 98–111)
Creatinine, Ser: 1.83 mg/dL — ABNORMAL HIGH (ref 0.44–1.00)
GFR, Estimated: 31 mL/min — ABNORMAL LOW (ref >60)
Glucose, Bld: 131 mg/dL — ABNORMAL HIGH (ref 70–99)
Potassium: 3.9 mmol/L (ref 3.5–5.1)
Sodium: 133 mmol/L — ABNORMAL LOW (ref 135–145)

## 2023-02-19 LAB — HEMOGLOBIN AND HEMATOCRIT, BLOOD
HCT: 24.5 % — ABNORMAL LOW (ref 36.0–46.0)
Hemoglobin: 8.2 g/dL — ABNORMAL LOW (ref 12.0–15.0)

## 2023-02-19 LAB — PREPARE RBC (CROSSMATCH)

## 2023-02-19 LAB — MAGNESIUM: Magnesium: 1.7 mg/dL (ref 1.7–2.4)

## 2023-02-19 MED ORDER — SODIUM CHLORIDE 0.9% IV SOLUTION: Freq: Once | INTRAVENOUS | Status: AC

## 2023-02-19 MED ORDER — FENTANYL 50 MCG/HR TD PT72
1.0000 | MEDICATED_PATCH | TRANSDERMAL | Status: DC
  Administered 2023-02-19: 1 via TRANSDERMAL
  Filled 2023-02-19: qty 1

## 2023-02-19 MED ORDER — AZITHROMYCIN 500 MG PO TABS: 500.0000 mg | ORAL_TABLET | Freq: Every day | ORAL | 0 refills | Status: DC

## 2023-02-19 NOTE — Progress Notes (Signed)
PROGRESS NOTE    Paula Garrett   OVF:[id number] DOB: 07/16/1965  DOA: 02/10/2023 Date of Service: 02/19/23 PCP: Smitty Cords, DO     Brief Narrative / Hospital Course:  Paula Garrett is a 57 y.o. female with multiple medical problems including hypertension, depression, anxiety, history of tobacco abuse, and multiple myeloma, who was hospitalized 01/24/2023 to 02/06/2023 with chest pain and shortness of breath and was found to have a large soft tissue mass in the left T6-T7 paraspinal area with destruction of seventh rib and invasion of T6 and T7 vertebral bodies. Patient underwent surgery on 8/13 with postop period complicated by pain. Pathology positive for multiple myeloma.   Patient readmitted 02/10/2023 with AKI, Cr 5.27, assoc we/ hyperkalemia Nephrology consult. Renal US no concerns.   Developed fever/tachycardia 09/01, CXR c/w PNA vs pulm infarct. R/o PE. Started on abx. Needing O2   Started on impatient chemotherapy. Requiring PRBC transfusion d/t anemia. IV infiltrating and arm swelling, will d/w onc/vasc re: port placement     Consultants:  Nephrology Hem/Onc Palliative Care   Procedures: none      ASSESSMENT & PLAN:   Principal Problem:   AKI (acute kidney injury) (HCC) Active Problems:   Multiple myeloma (HCC)   Shortness of breath  AKI in the setting of multiple myeloma - improved   baseline creatinine .93,  on admission creatinine of 5.27.   Cr has improved with IVF and MM treatment. Nephrology following  Continue monitor BMP Continue IV fluids   PNA ceftriaxone and azithromycin    Acute hypoxemic respiratory failure likely due to PNA. ambulation test yesterday found pt desat to 79% on room air and needed 2L O2 to increase to 91%. Continue supplemental O2 to keep sats >=90%, wean as tolerated   Hyponatremia Likely due to renal failure Monitor BMP Nephrology following    Multiple myeloma HemOnc following, patient seen by Dr.  Smith Robert given significant renal issues from multiple myeloma patient to be started on inpatient chemotherapy  Follow further recommendations per Dr. Smith Robert, velcade per schedule start ASA cont allopurinol   Anemia s/p 1u pRBC (irradiate)  monitor and transfuse to keep Hgb >7 Transfusing another 1 unit PRBC today 02/19/23 per Dr Farrel Demark to complete transfusion d/t IV infiltrating  Recent resection of Mass of spinal cord Neurosurgical procedure done on 8/13 by Dr. Katrinka Blazing - posterior segmental instrumentation T4-T9, posterior lateral arthrodesis from T4-T9, thoracic laminectomy at T6 and T7.   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  pain management as below   Chest pain Chronic back pain switched from home oxycontin to Fentanyl patch, switched from home oxycodone PRN to oral dialudid PRN. pt claiming she wasn't receiving the pain meds that were charted as given, and requested pain meds to be scheduled. Rec 2 RN's as witness to administration of pain meds cont gabapentin increased Fentanyl patch 25 --> 50 today, will work on tapering down on po dilaudid  oral dilaudid 2 mg changed to q4h scheduled (Hold when pt is somnolent)   Asthma COPD cont daily bronchodilators   Anxiety depression on chronic benzo continue Lexapro and Klonopin   Hypothyroidism continue Synthroid    Hyperkalemia - resolved D/t AKI/ARF Monitor BMP  Mild encephalopathy, ruled out  PE, ruled out V/Q scan neg for PE. d/c empiric Eliquis    DVT prophylaxis: lovenox  IV fluids: no continuous IV fluids  Nutrition: renal   Central lines / invasive devices: none  Code Status: FULL CODE ACP  documentation reviewed: 02/19/23 none on file in [facility]  Current Admission Status: inpatietn, med/surg   LOS: 9 TOC needs: TBD but expect will go back home Barriers to discharge / significant pending items: clearance from HemOnc and nephrology but may be appropriate to d/c next 1-2 days if renal function and anemia  stay stable/improving and can control pain + have f/u plan for outpatient              Subjective / Brief ROS:  Patient reports significant pain Denies CP Reports SOB and is upset the O2 tube isn't long enough  Pain controlled.  Denies new weakness.  Tolerating diet.  Reports no concerns w/ urination/defecation.   Family Communication: none at this time     Objective Findings:  Vitals:   02/19/23 0700 02/19/23 1405 02/19/23 1422 02/19/23 1730  BP: 113/75 115/65 (!) 112/56 120/69  Pulse: 94 88 81 78  Resp: 18 18 18 20   Temp: 98 F (36.7 C) 97.6 F (36.4 C) (!) 97.5 F (36.4 C) 97.7 F (36.5 C)  TempSrc: Oral Oral  Oral  SpO2: 93% 97%  95%  Weight:      Height:        Intake/Output Summary (Last 24 hours) at 02/19/2023 1757 Last data filed at 02/19/2023 1230 Gross per 24 hour  Intake 480 ml  Output 550 ml  Net -70 ml   Filed Weights   02/10/23 1805 02/12/23 1053  Weight: 81.6 kg 88.5 kg    Examination:  Physical Exam Constitutional:      General: She is not in acute distress.    Appearance: She is not toxic-appearing.  Cardiovascular:     Rate and Rhythm: Normal rate and regular rhythm.  Pulmonary:     Effort: Pulmonary effort is normal.     Breath sounds: Normal breath sounds.  Neurological:     General: No focal deficit present.     Mental Status: She is alert and oriented to person, place, and time.  Psychiatric:        Mood and Affect: Mood is anxious.        Speech: Speech normal.        Behavior: Behavior is cooperative.     Comments: Resting in bed upon my entry to the room, then tearful and upset           Scheduled Medications:   allopurinol  100 mg Oral Daily   amLODipine  10 mg Oral Daily   aspirin EC  81 mg Oral Daily   azithromycin  500 mg Oral Daily   bisacodyl  10 mg Oral Daily   clonazePAM  1 mg Oral BID   docusate sodium  100 mg Oral BID   enoxaparin (LOVENOX) injection  40 mg Subcutaneous Q24H   escitalopram  10 mg  Oral Daily   fentaNYL  1 patch Transdermal Q72H   gabapentin  100 mg Oral BID   HYDROmorphone  2 mg Oral Q4H   influenza vac split trivalent PF  0.5 mL Intramuscular Tomorrow-1000   levothyroxine  250 mcg Oral QAC breakfast   loratadine  10 mg Oral Daily   magnesium gluconate  500 mg Oral Daily   mometasone-formoterol  2 puff Inhalation BID   pantoprazole  40 mg Oral Daily   polyethylene glycol  17 g Oral Daily   sodium bicarbonate  650 mg Oral TID   umeclidinium bromide  1 puff Inhalation Daily    Continuous Infusions:  sodium chloride  cefTRIAXone (ROCEPHIN)  IV 2 g (02/18/23 1655)   famotidine (PEPCID) IV      PRN Medications:  sodium chloride, acetaminophen, albuterol, albuterol, alteplase, butalbital-acetaminophen-caffeine, cyclobenzaprine, diphenhydrAMINE, EPINEPHrine, famotidine (PEPCID) IV, heparin lock flush, heparin lock flush, hydrALAZINE, methylPREDNISolone sodium succinate, sodium chloride flush, sodium chloride flush, sodium chloride flush, sodium chloride flush, zolpidem  Antimicrobials from admission:  Anti-infectives (From admission, onward)    Start     Dose/Rate Route Frequency Ordered Stop   02/20/23 0000  azithromycin (ZITHROMAX) 500 MG tablet        500 mg Oral Daily 02/19/23 1722     02/18/23 1600  cefTRIAXone (ROCEPHIN) 2 g in sodium chloride 0.9 % 100 mL IVPB        2 g 200 mL/hr over 30 Minutes Intravenous Every 24 hours 02/18/23 1436 02/23/23 1559   02/18/23 1530  azithromycin (ZITHROMAX) tablet 500 mg        500 mg Oral Daily 02/18/23 1436 02/23/23 0959   02/18/23 1330  amoxicillin-clavulanate (AUGMENTIN) 875-125 MG per tablet 1 tablet  Status:  Discontinued        1 tablet Oral Every 12 hours 02/18/23 1230 02/18/23 1436   02/18/23 0000  amoxicillin-clavulanate (AUGMENTIN) 875-125 MG tablet        1 tablet Oral Every 12 hours 02/18/23 1325 02/25/23 2359   02/11/23 0000  acyclovir (ZOVIRAX) 400 MG tablet        400 mg Oral 2 times daily 02/11/23  1244             Data Reviewed:  I have personally reviewed the following...  CBC: Recent Labs  Lab 02/15/23 0353 02/16/23 0442 02/17/23 0340 02/18/23 0533 02/19/23 0541  WBC 7.8 11.2* 11.5* 8.8 7.8  HGB 7.8* 8.4* 7.1* 7.4* 7.1*  HCT 23.7* 25.8* 21.6* 23.1* 21.3*  MCV 95.2 95.6 94.7 95.5 93.0  PLT 438* 401* 309 305 341   Basic Metabolic Panel: Recent Labs  Lab 02/13/23 0514 02/14/23 0322 02/15/23 0353 02/16/23 0442 02/17/23 0340 02/18/23 0533 02/19/23 0541  NA 129*   < > 136 136 133* 133* 133*  K 5.4*   < > 4.6 4.5 3.8 3.9 3.9  CL 99   < > 108 106 106 104 104  CO2 17*   < > 20* 20* 18* 21* 21*  GLUCOSE 99   < > 111* 96 117* 107* 131*  BUN 69*   < > 66* 54* 41* 33* 29*  CREATININE 4.63*   < > 3.37* 2.77* 2.12* 1.92* 1.83*  CALCIUM 7.9*   < > 8.2* 8.8* 8.2* 8.6* 8.5*  MG 2.1   < > 1.7 1.5* 1.6* 1.8 1.7  PHOS 6.0*  --   --   --   --   --   --    < > = values in this interval not displayed.   GFR: Estimated Creatinine Clearance: 34.2 mL/min (A) (by C-G formula based on SCr of 1.83 mg/dL (H)). Liver Function Tests: No results for input(s): "AST", "ALT", "ALKPHOS", "BILITOT", "PROT", "ALBUMIN" in the last 168 hours. No results for input(s): "LIPASE", "AMYLASE" in the last 168 hours. No results for input(s): "AMMONIA" in the last 168 hours. Coagulation Profile: No results for input(s): "INR", "PROTIME" in the last 168 hours. Cardiac Enzymes: No results for input(s): "CKTOTAL", "CKMB", "CKMBINDEX", "TROPONINI" in the last 168 hours. BNP (last 3 results) No results for input(s): "PROBNP" in the last 8760 hours. HbA1C: No results for input(s): "HGBA1C" in the last 72  hours. CBG: No results for input(s): "GLUCAP" in the last 168 hours.  Lipid Profile: No results for input(s): "CHOL", "HDL", "LDLCALC", "TRIG", "CHOLHDL", "LDLDIRECT" in the last 72 hours. Thyroid Function Tests: No results for input(s): "TSH", "T4TOTAL", "FREET4", "T3FREE", "THYROIDAB" in the  last 72 hours. Anemia Panel: No results for input(s): "VITAMINB12", "FOLATE", "FERRITIN", "TIBC", "IRON", "RETICCTPCT" in the last 72 hours. Most Recent Urinalysis On File:     Component Value Date/Time   COLORURINE STRAW (A) 02/10/2023 1500   APPEARANCEUR CLEAR (A) 02/10/2023 1500   APPEARANCEUR Cloudy (A) 10/27/2020 0831   LABSPEC 1.006 02/10/2023 1500   PHURINE 5.0 02/10/2023 1500   GLUCOSEU NEGATIVE 02/10/2023 1500   GLUCOSEU NEGATIVE 07/25/2017 0823   HGBUR NEGATIVE 02/10/2023 1500   BILIRUBINUR NEGATIVE 02/10/2023 1500   BILIRUBINUR Negative 10/27/2020 0831   KETONESUR NEGATIVE 02/10/2023 1500   PROTEINUR NEGATIVE 02/10/2023 1500   UROBILINOGEN 0.2 06/01/2018 0927   UROBILINOGEN 0.2 07/25/2017 0823   NITRITE NEGATIVE 02/10/2023 1500   LEUKOCYTESUR NEGATIVE 02/10/2023 1500   Sepsis Labs: @LABRCNTIP (procalcitonin:4,lacticidven:4) Microbiology: Recent Results (from the past 240 hour(s))  Surgical PCR screen     Status: None   Collection Time: 02/10/23  7:09 PM   Specimen: Nasal Mucosa; Nasal Swab  Result Value Ref Range Status   MRSA, PCR NEGATIVE NEGATIVE Final   Staphylococcus aureus NEGATIVE NEGATIVE Final    Comment: (NOTE) The Xpert SA Assay (FDA approved for NASAL specimens in patients 72 years of age and older), is one component of a comprehensive surveillance program. It is not intended to diagnose infection nor to guide or monitor treatment. Performed at [facility], [address] [place], [place] [address]   Culture, blood (Routine X 2) w Reflex to ID Panel     Status: None (Preliminary result)   Collection Time: 02/16/23  4:30 PM   Specimen: BLOOD  Result Value Ref Range Status   Specimen Description BLOOD RIGHT ANTECUBITAL  Final   Special Requests   Final    BOTTLES DRAWN AEROBIC AND ANAEROBIC Blood Culture adequate volume   Culture   Final    NO GROWTH 3 DAYS Performed at [facility], [address], [place], [place]  [address]    Report Status PENDING  Incomplete  Culture, blood (Routine X 2) w Reflex to ID Panel     Status: None (Preliminary result)   Collection Time: 02/16/23  4:33 PM   Specimen: BLOOD  Result Value Ref Range Status   Specimen Description BLOOD BLOOD LEFT HAND  Final   Special Requests   Final    BOTTLES DRAWN AEROBIC AND ANAEROBIC Blood Culture adequate volume   Culture   Final    NO GROWTH 3 DAYS Performed at [facility], [address] [place], [place] [address]    Report Status PENDING  Incomplete  SARS Coronavirus 2 by RT PCR (hospital order, performed in [facility] hospital lab) *cepheid single result test* Anterior Nasal Swab     Status: None   Collection Time: 02/16/23  5:45 PM   Specimen: Anterior Nasal Swab  Result Value Ref Range Status   SARS Coronavirus 2 by RT PCR NEGATIVE NEGATIVE Final    Comment: (NOTE) SARS-CoV-2 target nucleic acids are NOT DETECTED.  The SARS-CoV-2 RNA is generally detectable in upper and lower respiratory specimens during the acute phase of infection. The lowest concentration of SARS-CoV-2 viral copies this assay can detect is 250 copies / mL. A negative result does not preclude  SARS-CoV-2 infection and should not be used as the sole basis for treatment or other patient management decisions.  A negative result may occur with improper specimen collection / handling, submission of specimen other than nasopharyngeal swab, presence of viral mutation(s) within the areas targeted by this assay, and inadequate number of viral copies (<250 copies / mL). A negative result must be combined with clinical observations, patient history, and epidemiological information.  Fact Sheet for Patients:   RoadLapTop.co.za  Fact Sheet for Healthcare Providers: http://kim-miller.com/  This test is not yet approved or  cleared by the [place] FDA and has been authorized for detection and/or  diagnosis of SARS-CoV-2 by FDA under an Emergency Use Authorization (EUA).  This EUA will remain in effect (meaning this test can be used) for the duration of the COVID-19 declaration under Section 564(b)(1) of the Act, 21 U.S.C. section 360bbb-3(b)(1), unless the authorization is terminated or revoked sooner.  Performed at [facility], [address], [place], [place] [address]       Radiology Studies last 3 days: [facility] Chest [facility] 1 View  Result Date: 02/18/2023 CLINICAL DATA:  History of multiple myeloma and pulmonary embolus. EXAM: PORTABLE CHEST 1 VIEW COMPARISON:  February 16, 2023. FINDINGS: Stable cardiomegaly. Continued presence of wedge-shaped consolidation within the left midlung concerning for pneumonia or infarction as noted on prior exam. Mild central pulmonary vascular congestion is noted. Left basilar atelectasis or infiltrate and effusion cannot be excluded. Postsurgical changes are seen in thoracic spine. IMPRESSION: Continued presence wedge-shaped consolidation with left midlung concerning for pneumonia or infarction as noted on prior exam. Mild central pulmonary vascular congestion. Left basilar atelectasis or infiltrate and pleural effusion cannot be excluded. Electronically Signed   By: Lupita Raider M.D.   On: 02/18/2023 12:43   NM Pulmonary Perfusion  Result Date: 02/18/2023 CLINICAL DATA:  Concern for pulmonary embolism.  Short of breath. EXAM: NUCLEAR MEDICINE PERFUSION LUNG SCAN TECHNIQUE: Perfusion images were obtained in multiple projections after intravenous injection of radiopharmaceutical. RADIOPHARMACEUTICALS:  4.5 mCi Tc-43m MAA COMPARISON:  Chest radiograph 02/18/2023 FINDINGS: Marked decreased relative perfusion to the entire LEFT lung is favored related to attenuation artifact from large LEFT effusion seen on comparison radiograph. No wedge-shaped peripheral perfusion defects within LEFT or RIGHT lung to suggest acute pulmonary embolism. IMPRESSION: 1.  No evidence acute pulmonary embolism. 2. Decreased relative activity in the LEFT lung is favor related to large LEFT effusion. Electronically Signed   By: Genevive Bi M.D.   On: 02/18/2023 12:40   DG Chest Port 1 View  Result Date: 02/16/2023 CLINICAL DATA:  Fever of unknown origin, history of multiple myeloma EXAM: PORTABLE CHEST 1 VIEW COMPARISON:  02/10/2023, 02/12/2023 FINDINGS: Single frontal view of the chest demonstrates stable enlargement the cardiac silhouette. Chronic vascular congestion. Nodular areas of consolidation are again seen bilaterally, unchanged since recent PET scan. There is a new peripheral area of wedge-shaped consolidation within the left midlung, measuring up to 6.3 cm. Small left pleural effusion. No pneumothorax. Postsurgical changes from thoracic fusion. Destructive lesions of the midthoracic spine are not well visualized on this x-ray. IMPRESSION: 1. New peripheral area of wedge-shaped consolidation within the left mid lung, most consistent with focal pneumonia or pulmonary infarct given rapid development since recent PET CT. 2. Small left pleural effusion. 3. Nodular areas of consolidation bilaterally, which may be inflammatory or infectious. No change since recent PET scan. 4. Chronic vascular congestion. Electronically Signed   By: Maxwell Caul.D.  On: 02/16/2023 20:29             LOS: 9 days     Sunnie Nielsen, DO [facility] 02/19/2023, 5:57 PM    Dictation software may have been used to generate the above note. Typos may occur and escape review in typed/dictated notes. Please contact Dr Lyn Hollingshead directly for clarity if needed.  Staff may message me via secure chat in Epic  but this may not receive an immediate response,  please page me for urgent matters!  If 7PM-7AM, please contact night coverage [email or web address]

## 2023-02-19 NOTE — Progress Notes (Signed)
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 Hospital course complicated by AKI and electrolyte abnormalities.  Patient seen sitting at bedside Very anxious and tearful Unable to find bank card (found once I left room) Very adamant that she is leaving today    Objective:  Vital signs in last 24 hours:  Temp:  [98 F (36.7 C)-98.2 F (36.8 C)] 98 F (36.7 C) (09/04 0700) Pulse Rate:  [92-95] 94 (09/04 0700) Resp:  [18-19] 18 (09/04 0700) BP: (113-129)/(59-75) 113/75 (09/04 0700) SpO2:  [79 %-97 %] 93 % (09/04 0700)  Weight change:  Filed Weights   02/10/23 1805 02/12/23 1053  Weight: 81.6 kg 88.5 kg    Intake/Output: I/O last 3 completed shifts: In: 343.3 [P.O.:240; I.V.:47.1; IV Piggyback:56.3] Out: 2350 [Urine:2350]   Intake/Output this shift:  Total I/O In: 240 [P.O.:240] Out: -   Physical Exam: General: NAD, tearful, restless, agitated  Head: Normocephalic, atraumatic. Moist oral mucosal membranes  Eyes: Anicteric  Lungs:  Mild tachypnea   Heart: Regular rate and rhythm  Abdomen:  Soft, nontender  Extremities: trace peripheral edema.  Neurologic: Alert, moving all four extremities  Skin: No lesions  Access: None    Basic Metabolic Panel: Recent Labs  Lab 02/13/23 0514 02/14/23 0322 02/15/23 0353 02/16/23 0442 02/17/23 0340 02/18/23 0533 02/19/23 0541  NA 129*   < > 136 136 133* 133* 133*  K 5.4*   < > 4.6 4.5 3.8 3.9 3.9  CL 99   < > 108 106 106 104 104  CO2 17*   < > 20* 20* 18* 21* 21*  GLUCOSE 99   < > 111* 96 117* 107* 131*  BUN 69*   < > 66* 54* 41* 33* 29*  CREATININE 4.63*   < > 3.37* 2.77* 2.12* 1.92* 1.83*  CALCIUM 7.9*   < > 8.2* 8.8* 8.2* 8.6* 8.5*  MG 2.1   < > 1.7 1.5* 1.6* 1.8 1.7  PHOS 6.0*  --   --   --   --    --   --    < > = values in this interval not displayed.    Liver Function Tests: No results for input(s): "AST", "ALT", "ALKPHOS", "BILITOT", "PROT", "ALBUMIN" in the last 168 hours.  No results for input(s): "LIPASE", "AMYLASE" in the last 168 hours. No results for input(s): "AMMONIA" in the last 168 hours.  CBC: Recent Labs  Lab 02/15/23 0353 02/16/23 0442 02/17/23 0340 02/18/23 0533 02/19/23 0541  WBC 7.8 11.2* 11.5* 8.8 7.8  HGB 7.8* 8.4* 7.1* 7.4* 7.1*  HCT 23.7* 25.8* 21.6* 23.1* 21.3*  MCV 95.2 95.6 94.7 95.5 93.0  PLT 438* 401* 309 305 341    Cardiac Enzymes: No results for input(s): "CKTOTAL", "CKMB", "CKMBINDEX", "TROPONINI" in the last 168 hours.   BNP: Invalid input(s): "POCBNP"  CBG: No results for input(s): "GLUCAP" in the last 168 hours.   Microbiology: Results for orders placed or performed during the hospital encounter of 02/10/23  Surgical PCR screen     Status: None   Collection Time: 02/10/23  7:09 PM   Specimen: Nasal Mucosa; Nasal Swab  Result Value Ref Range Status   MRSA, PCR NEGATIVE NEGATIVE Final   Staphylococcus aureus NEGATIVE NEGATIVE Final    Comment: (NOTE) The Xpert SA Assay (FDA approved  for NASAL specimens in patients 60 years of age and older), is one component of a comprehensive surveillance program. It is not intended to diagnose infection nor to guide or monitor treatment. Performed at [facility], [address] [place], [place] [address]   Culture, blood (Routine X 2) w Reflex to ID Panel     Status: None (Preliminary result)   Collection Time: 02/16/23  4:30 PM   Specimen: BLOOD  Result Value Ref Range Status   Specimen Description BLOOD RIGHT ANTECUBITAL  Final   Special Requests   Final    BOTTLES DRAWN AEROBIC AND ANAEROBIC Blood Culture adequate volume   Culture   Final    NO GROWTH 3 DAYS Performed at [facility], [address], [place], [place] [address]    Report Status PENDING   Incomplete  Culture, blood (Routine X 2) w Reflex to ID Panel     Status: None (Preliminary result)   Collection Time: 02/16/23  4:33 PM   Specimen: BLOOD  Result Value Ref Range Status   Specimen Description BLOOD BLOOD LEFT HAND  Final   Special Requests   Final    BOTTLES DRAWN AEROBIC AND ANAEROBIC Blood Culture adequate volume   Culture   Final    NO GROWTH 3 DAYS Performed at [facility], [address], [place], [place] [address]    Report Status PENDING  Incomplete  SARS Coronavirus 2 by RT PCR (hospital order, performed in [facility] hospital lab) *cepheid single result test* Anterior Nasal Swab     Status: None   Collection Time: 02/16/23  5:45 PM   Specimen: Anterior Nasal Swab  Result Value Ref Range Status   SARS Coronavirus 2 by RT PCR NEGATIVE NEGATIVE Final    Comment: (NOTE) SARS-CoV-2 target nucleic acids are NOT DETECTED.  The SARS-CoV-2 RNA is generally detectable in upper and lower respiratory specimens during the acute phase of infection. The lowest concentration of SARS-CoV-2 viral copies this assay can detect is 250 copies / mL. A negative result does not preclude SARS-CoV-2 infection and should not be used as the sole basis for treatment or other patient management decisions.  A negative result may occur with improper specimen collection / handling, submission of specimen other than nasopharyngeal swab, presence of viral mutation(s) within the areas targeted by this assay, and inadequate number of viral copies (<250 copies / mL). A negative result must be combined with clinical observations, patient history, and epidemiological information.  Fact Sheet for Patients:   RoadLapTop.co.za  Fact Sheet for Healthcare Providers: http://kim-miller.com/  This test is not yet approved or  cleared by the [place] FDA and has been authorized for detection and/or diagnosis of SARS-CoV-2 by FDA under an  Emergency Use Authorization (EUA).  This EUA will remain in effect (meaning this test can be used) for the duration of the COVID-19 declaration under Section 564(b)(1) of the Act, 21 U.S.C. section 360bbb-3(b)(1), unless the authorization is terminated or revoked sooner.  Performed at [facility], [address] [place], [place] [address]     Coagulation Studies: No results for input(s): "LABPROT", "INR" in the last 72 hours.  Urinalysis: No results for input(s): "COLORURINE", "LABSPEC", "PHURINE", "GLUCOSEU", "HGBUR", "BILIRUBINUR", "KETONESUR", "PROTEINUR", "UROBILINOGEN", "NITRITE", "LEUKOCYTESUR" in the last 72 hours.  Invalid input(s): "APPERANCEUR"     Imaging: DG Chest Port 1 View  Result Date: 02/18/2023 CLINICAL DATA:  History of multiple myeloma and pulmonary embolus. EXAM: PORTABLE CHEST 1 VIEW COMPARISON:  February 16, 2023. FINDINGS: Stable cardiomegaly. Continued presence of wedge-shaped consolidation within the left midlung concerning for pneumonia or infarction as noted on prior exam. Mild central pulmonary vascular congestion is noted. Left basilar atelectasis or infiltrate and effusion cannot be excluded. Postsurgical changes are seen in thoracic spine. IMPRESSION: Continued presence wedge-shaped consolidation with left midlung concerning for pneumonia or infarction as noted on prior exam. Mild central pulmonary vascular congestion. Left basilar atelectasis or infiltrate and pleural effusion cannot be excluded. Electronically Signed   By: Lupita Raider M.D.   On: 02/18/2023 12:43   NM Pulmonary Perfusion  Result Date: 02/18/2023 CLINICAL DATA:  Concern for pulmonary embolism.  Short of breath. EXAM: NUCLEAR MEDICINE PERFUSION LUNG SCAN TECHNIQUE: Perfusion images were obtained in multiple projections after intravenous injection of radiopharmaceutical. RADIOPHARMACEUTICALS:  4.5 mCi Tc-70m MAA COMPARISON:  Chest radiograph 02/18/2023 FINDINGS: Marked decreased  relative perfusion to the entire LEFT lung is favored related to attenuation artifact from large LEFT effusion seen on comparison radiograph. No wedge-shaped peripheral perfusion defects within LEFT or RIGHT lung to suggest acute pulmonary embolism. IMPRESSION: 1. No evidence acute pulmonary embolism. 2. Decreased relative activity in the LEFT lung is favor related to large LEFT effusion. Electronically Signed   By: Genevive Bi M.D.   On: 02/18/2023 12:40     Medications:    sodium chloride     cefTRIAXone (ROCEPHIN)  IV 2 g (02/18/23 1655)   famotidine (PEPCID) IV      sodium chloride   Intravenous Once   allopurinol  100 mg Oral Daily   amLODipine  10 mg Oral Daily   aspirin EC  81 mg Oral Daily   azithromycin  500 mg Oral Daily   bisacodyl  10 mg Oral Daily   clonazePAM  1 mg Oral BID   docusate sodium  100 mg Oral BID   enoxaparin (LOVENOX) injection  40 mg Subcutaneous Q24H   escitalopram  10 mg Oral Daily   fentaNYL  1 patch Transdermal Q72H   gabapentin  100 mg Oral BID   HYDROmorphone  2 mg Oral Q4H   influenza vac split trivalent PF  0.5 mL Intramuscular Tomorrow-1000   levothyroxine  250 mcg Oral QAC breakfast   loratadine  10 mg Oral Daily   magnesium gluconate  500 mg Oral Daily   mometasone-formoterol  2 puff Inhalation BID   pantoprazole  40 mg Oral Daily   polyethylene glycol  17 g Oral Daily   sodium bicarbonate  650 mg Oral TID   umeclidinium bromide  1 puff Inhalation Daily   sodium chloride, acetaminophen, albuterol, albuterol, alteplase, butalbital-acetaminophen-caffeine, cyclobenzaprine, diphenhydrAMINE, EPINEPHrine, famotidine (PEPCID) IV, heparin lock flush, heparin lock flush, hydrALAZINE, methylPREDNISolone sodium succinate, sodium chloride flush, sodium chloride flush, sodium chloride flush, sodium chloride flush, zolpidem  Assessment/ Plan:  [name] is a 57 y.o.  female with past medical history hypertension, depression, anxiety, tobacco,  and recent diagnosis of multiple myeloma, who was admitted to [facility] on 02/10/2023 for AKI (acute kidney injury) (HCC) [N17.9]   Acute kidney injury appears to multifactorial. Admits to NSAID use and recent diagnosis of multiple myeloma. Differential diagnosis includes myeloma nephropathy. Renal ultrasound negative for hydronephrosis.  Renal function improving with good urine output. Will schedule an appt in our office at discharge    Lab Results  Component Value Date   CREATININE 1.83 (H) 02/19/2023   CREATININE 1.92 (H) 02/18/2023   CREATININE 2.12 (H) 02/17/2023    Intake/Output  Summary (Last 24 hours) at 02/19/2023 1145 Last data filed at 02/19/2023 1031 Gross per 24 hour  Intake 279.66 ml  Output 1450 ml  Net -1170.34 ml    2. Hyperkalemia, likely secondary to kidney injury. Potassium 3.9   3. Acute metabolic acidosis, S bicarb 19 on admission. Improved .  Continues to improve with oral supplementation   4. Multiple myeloma diagnosed with bone marrow biopsy on 02/05/23. Following Dr Smith Robert outpatient. Chemotherapy and pain control per oncology team.   5.  Hypomagnesemia.  Corrected with supplementation.     LOS: 9   9/4/202411:45 AM

## 2023-02-19 NOTE — Hospital Course (Addendum)
Paula Garrett is a 57 y.o. female with multiple medical problems including hypertension, depression, anxiety, history of tobacco abuse, and multiple myeloma, who was hospitalized 01/24/2023 to 02/06/2023 with chest pain and shortness of breath and was found to have a large soft tissue mass in the left T6-T7 paraspinal area with destruction of seventh rib and invasion of T6 and T7 vertebral bodies. Patient underwent surgery on 8/13 with postop period complicated by pain. Pathology positive for multiple myeloma.   Patient readmitted 02/10/2023 with AKI, Cr 5.27, assoc we/ hyperkalemia Nephrology consult. Renal US no concerns. AKI improving. Cr down to 1.63 on hospital day 10.   Developed fever/tachycardia 09/01, CXR c/w PNA vs pulm infarct. R/o PE. Started on abx.   Needing O2 which she was declining so was discharged per her request 09/05 but then she changed her mind about the home O2 and d/c was cancelled ***  Started on inpatient chemotherapy. Requiring PRBC transfusion d/t anemia. IV infiltrating and arm swelling, d/c IV, pt is not going to need port for chemo, ok to leave IV out for now.    Consultants:  Nephrology Hem/Onc Palliative Care   Procedures: none      ASSESSMENT & PLAN:   Principal Problem:   AKI (acute kidney injury) (HCC) Active Problems:   Multiple myeloma (HCC)   Shortness of breath  AKI in the setting of multiple myeloma - improved   baseline creatinine .93,  on admission creatinine of 5.27.   Cr has improved with IVF and MM treatment. Nephrology following  monitor BMP outpatient  Continue po fluids   PNA ceftriaxone and azithromycin --> augmentin + azitrho po on discharge    Acute hypoxemic respiratory failure likely due to PNA. ambulation test yesterday see RN note  Continue supplemental O2 to keep sats >=90%, wean as tolerated Will need home O2   Hyponatremia Likely due to renal failure, improving  Monitor BMP outpatient    Multiple  myeloma HemOnc following, Dr. Smith Robert given significant renal issues from multiple myeloma patient has been started on inpatient chemotherapy  Follow further recommendations per Dr. Smith Robert, velcade per schedule start ASA cont allopurinol   Anemia s/p 2u pRBC (irradiate) this admission monitor and transfuse to keep Hgb >7  Recent resection of Mass of spinal cord Chest pain Chronic back pain Neurosurgical procedure done on 8/13 by Dr. Katrinka Blazing - posterior segmental instrumentation T4-T9, posterior lateral arthrodesis from T4-T9, thoracic laminectomy at T6 and T7.   Plasma cell neoplasm (+)pathology  pain management: Fentanyl patch 50 mcg + po dilaudid 1-2 mg q6h prn  cont gabapentin  Asthma COPD cont daily bronchodilators Supplemental O2    Anxiety depression on chronic benzo continue Lexapro and Klonopin   Hypothyroidism continue Synthroid    Hyperkalemia - resolved D/t AKI/ARF Monitor BMP  Mild encephalopathy, ruled out      DVT prophylaxis: lovenox  IV fluids: no continuous IV fluids  Nutrition: renal   Central lines / invasive devices: none  Code Status: FULL CODE ACP documentation reviewed: 02/20/23 none on file in [facility]  Current Admission Status: inpatietn, med/surg   LOS: 10 TOC needs: TBD but expect will go back home Barriers to discharge / significant pending items: clearance from HemOnc and nephrology ***

## 2023-02-19 NOTE — Progress Notes (Signed)
Hematology/Oncology Consult note [facility]  [phone] [phone]  Patient Care Team: Smitty Cords, DO as PCP - General (Family Medicine) Lemar Livings, Merrily Pew, MD (General Surgery) Shelia Media, MD (Internal Medicine) Creig Hines, MD as Consulting Physician (Oncology)   Name of the patient: Paula Garrett  [id number]  [birth year]   Date of visit: 02/17/58   Interval history-patient tearful this morning and feels that her pain medications are not given in a timely manner.  She would like to go home as soon as possible.  She spiked a fever over the weekend.  Was empirically started on Eliquis for possible PE in anticipation of getting a VQ scan.  Chest x-ray showed an area of pulmonary infarct/possible infection.  No fever over the last 36 hours.  She is currently on 2 L of oxygen  ECOG PS- 1 Pain scale- 5 Opioid associated constipation- no  Review of systems- Review of Systems  Constitutional:  Positive for malaise/fatigue. Negative for chills, fever and weight loss.  HENT:  Negative for congestion, ear discharge and nosebleeds.   Eyes:  Negative for blurred vision.  Respiratory:  Positive for shortness of breath. Negative for cough, hemoptysis, sputum production and wheezing.   Cardiovascular:  Negative for chest pain, palpitations, orthopnea and claudication.  Gastrointestinal:  Negative for abdominal pain, blood in stool, constipation, diarrhea, heartburn, melena, nausea and vomiting.  Genitourinary:  Negative for dysuria, flank pain, frequency, hematuria and urgency.  Musculoskeletal:  Positive for back pain. Negative for joint pain and myalgias.  Skin:  Negative for rash.  Neurological:  Negative for dizziness, tingling, focal weakness, seizures, weakness and headaches.  Endo/Heme/Allergies:  Does not bruise/bleed easily.  Psychiatric/Behavioral:  Negative for depression and suicidal ideas. The patient does not  have insomnia.       Allergies  Allergen Reactions   Hctz [Hydrochlorothiazide]     Hyponatremia      Past Medical History:  Diagnosis Date   Anxiety    Arthritis    Asthma    Barrett's esophagus 2015   COVID-19    03/10/20   Depression    GERD (gastroesophageal reflux disease)    Hypertension    Hypothyroidism    Pneumonia    Smoldering myeloma    Thyroid disease      Past Surgical History:  Procedure Laterality Date   ABLATION  2006   APPLICATION OF INTRAOPERATIVE CT SCAN N/A 01/28/2023   Procedure: APPLICATION OF INTRAOPERATIVE CT SCAN;  Surgeon: Loreen Freud, MD;  Location: [facility] ORS;  Service: Neurosurgery;  Laterality: N/A;   BREAST CYST ASPIRATION Left 1998   BREAST SURGERY     cyst aspiration   BREAST SURGERY  1998   cyst aspiration   COLONOSCOPY  03/2014   COLONOSCOPY WITH PROPOFOL N/A 01/06/2020   Procedure: COLONOSCOPY WITH PROPOFOL;  Surgeon: Toledo, Boykin Nearing, MD;  Location: [facility] ENDOSCOPY;  Service: Gastroenterology;  Laterality: N/A;   ENDOMETRIAL ABLATION     ESOPHAGOGASTRODUODENOSCOPY (EGD) WITH PROPOFOL N/A 08/15/2015   Procedure: ESOPHAGOGASTRODUODENOSCOPY (EGD) WITH PROPOFOL;  Surgeon: Christena Deem, MD;  Location: [facility] ENDOSCOPY;  Service: Endoscopy;  Laterality: N/A;   ESOPHAGOGASTRODUODENOSCOPY (EGD) WITH PROPOFOL N/A 01/22/2016   Procedure: ESOPHAGOGASTRODUODENOSCOPY (EGD) WITH PROPOFOL;  Surgeon: Christena Deem, MD;  Location: [facility] ENDOSCOPY;  Service: Endoscopy;  Laterality: N/A;   ESOPHAGOGASTRODUODENOSCOPY (EGD) WITH PROPOFOL N/A 01/06/2020   Procedure: ESOPHAGOGASTRODUODENOSCOPY (EGD) WITH PROPOFOL;  Surgeon: Toledo, Boykin Nearing, MD;  Location: [facility] ENDOSCOPY;  Service: Gastroenterology;  Laterality: N/A;   GASTRIC BYPASS  2005   HERNIA REPAIR     IR BONE MARROW BIOPSY & ASPIRATION  02/05/2023   NASAL SINUS SURGERY     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   TOTAL THYROIDECTOMY     TUBAL LIGATION      Social History    Socioeconomic History   Marital status: Divorced    Spouse name: Not on file   Number of children: 2   Years of education: Not on file   Highest education level: GED or equivalent  Occupational History   Occupation: Part-time  Tobacco Use   Smoking status: Every Day    Current packs/day: 1.00    Average packs/day: 1 pack/day for 42.9 years (42.9 ttl pk-yrs)    Types: Cigarettes    Start date: 03/15/1980   Smokeless tobacco: Never  Vaping Use   Vaping status: Never Used  Substance and Sexual Activity   Alcohol use: Yes    Alcohol/week: 10.0 - 12.0 standard drinks of alcohol    Types: 8 - 10 Glasses of wine, 2 Standard drinks or equivalent per week   Drug use: No   Sexual activity: Yes    Partners: Male  Other Topics Concern   Not on file  Social History Narrative   Lives in [place] single, divorced       Work - 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       Diet - healthy diet   Exercise - limited   Caffeine use: daily   Social Determinants of Health   Financial Resource Strain: Low Risk  (11/01/2022)   Overall Financial Resource Strain (CARDIA)    Difficulty of Paying Living Expenses: Not very hard  Food Insecurity: No Food Insecurity (02/10/2023)   Hunger Vital Sign    Worried About Running Out of Food in the Last Year: Never true    Ran Out of Food in the Last Year: Never true  Transportation Needs: No Transportation Needs (02/10/2023)   PRAPARE - Administrator, Civil Service (Medical): No    Lack of Transportation (Non-Medical): No  Physical Activity: Insufficiently Active (11/01/2022)   Exercise Vital Sign    Days of Exercise per Week: 3 days    Minutes of Exercise per Session: 20 min  Stress: Stress Concern Present (11/01/2022)   [employer] of Occupational Health - Occupational Stress Questionnaire    Feeling of Stress : Very much  Social Connections: Socially Isolated (11/01/2022)   Social Connection and Isolation Panel [NHANES]     Frequency of Communication with Friends and Family: More than three times a week    Frequency of Social Gatherings with Friends and Family: Once a week    Attends Religious Services: Never    Database administrator or Organizations: No    Attends Engineer, structural: Not on file    Marital Status: Divorced  Intimate Partner Violence: Not At Risk (02/10/2023)   Humiliation, Afraid, Rape, and Kick questionnaire    Fear of Current or Ex-Partner: No    Emotionally Abused: No    Physically Abused: No    Sexually Abused: No    Family History  Problem Relation Age of Onset   Heart disease Mother    COPD Mother    Arthritis Mother    Cancer Mother        uterine   Lupus Mother    Anxiety disorder Mother  Depression Mother    Drug abuse Mother    COPD Father    Arthritis Father    Heart disease Father    Heart attack Father    Alcohol abuse Father    Heart disease Brother    Heart attack Brother    Drug abuse Brother    Anxiety disorder Brother    Depression Brother    Heart disease Brother    Cancer Brother        liver cancer age 68 y.o   Diabetes Maternal Grandmother    Diabetes Maternal Grandfather    Diabetes Paternal Grandmother    Diabetes Paternal Grandfather    Breast cancer Neg Hx      Current Facility-Administered Medications:    0.9 %  sodium chloride infusion, , Intravenous, Once PRN, Creig Hines, MD   acetaminophen (TYLENOL) tablet 1,000 mg, 1,000 mg, Oral, TID PRN, Darlin Priestly, MD, 1,000 mg at 02/16/23 1629   albuterol (PROVENTIL) (2.5 MG/3ML) 0.083% nebulizer solution 2.5 mg, 2.5 mg, Nebulization, Once PRN, Creig Hines, MD   albuterol (PROVENTIL) (2.5 MG/3ML) 0.083% nebulizer solution 3 mL, 3 mL, Inhalation, Q6H PRN, Mikey College T, MD, 3 mL at 02/17/23 1036   allopurinol (ZYLOPRIM) tablet 100 mg, 100 mg, Oral, Daily, Creig Hines, MD, 100 mg at 02/18/23 0812   alteplase (CATHFLO ACTIVASE) injection 2 mg, 2 mg, Intracatheter, Once PRN, Creig Hines, MD   amLODipine (NORVASC) tablet 10 mg, 10 mg, Oral, Daily, Darlin Priestly, MD, 10 mg at 02/18/23 1610   aspirin EC tablet 81 mg, 81 mg, Oral, Daily, Creig Hines, MD, 81 mg at 02/18/23 1648   azithromycin (ZITHROMAX) tablet 500 mg, 500 mg, Oral, Daily, Darlin Priestly, MD, 500 mg at 02/18/23 1649   bisacodyl (DULCOLAX) EC tablet 10 mg, 10 mg, Oral, Daily, Enedina Finner, MD, 10 mg at 02/15/23 1024   butalbital-acetaminophen-caffeine (FIORICET) 50-325-40 MG per tablet 1 tablet, 1 tablet, Oral, Q4H PRN, Andris Baumann, MD, 1 tablet at 02/18/23 0718   cefTRIAXone (ROCEPHIN) 2 g in sodium chloride 0.9 % 100 mL IVPB, 2 g, Intravenous, Q24H, Darlin Priestly, MD, Last Rate: 200 mL/hr at 02/18/23 1655, 2 g at 02/18/23 1655   Chlorhexidine Gluconate Cloth 2 % PADS 6 each, 6 each, Topical, Daily, Enedina Finner, MD, 6 each at 02/18/23 1102   clonazePAM (KLONOPIN) tablet 1 mg, 1 mg, Oral, BID, Darlin Priestly, MD, 1 mg at 02/18/23 2105   cyclobenzaprine (FLEXERIL) tablet 5-10 mg, 5-10 mg, Oral, TID PRN, Mikey College T, MD, 10 mg at 02/17/23 9604   diphenhydrAMINE (BENADRYL) injection 50 mg, 50 mg, Intravenous, Once PRN, Creig Hines, MD   docusate sodium (COLACE) capsule 100 mg, 100 mg, Oral, BID, Chipper Herb, Ping T, MD, 100 mg at 02/17/23 2019   enoxaparin (LOVENOX) injection 40 mg, 40 mg, Subcutaneous, Q24H, Darlin Priestly, MD   EPINEPHrine (EPI-PEN) injection 0.3 mg, 0.3 mg, Intramuscular, Once PRN, Creig Hines, MD   escitalopram (LEXAPRO) tablet 10 mg, 10 mg, Oral, Daily, Chipper Herb, Ping T, MD, 10 mg at 02/18/23 0813   famotidine (PEPCID) IVPB 20 mg premix, 20 mg, Intravenous, Once PRN, Creig Hines, MD   fentaNYL (DURAGESIC) 25 MCG/HR 1 patch, 1 patch, Transdermal, Q72H, Darlin Priestly, MD, 1 patch at 02/18/23 1411   gabapentin (NEURONTIN) capsule 100 mg, 100 mg, Oral, BID, Mikey College T, MD, 100 mg at 02/18/23 2105   heparin lock flush 100 unit/mL, 500 Units, Intracatheter, Once PRN, Smith Robert,  Pete Glatter, MD   heparin lock flush 100  unit/mL, 250 Units, Intracatheter, Once PRN, Creig Hines, MD   hydrALAZINE (APRESOLINE) injection 10 mg, 10 mg, Intravenous, Q6H PRN, Darlin Priestly, MD, 10 mg at 02/16/23 1628   HYDROmorphone (DILAUDID) tablet 2 mg, 2 mg, Oral, Q4H, Darlin Priestly, MD, 2 mg at 02/19/23 6578   influenza vac split trivalent PF (FLULAVAL) injection 0.5 mL, 0.5 mL, Intramuscular, Tomorrow-1000, Darlin Priestly, MD   levothyroxine (SYNTHROID) tablet 250 mcg, 250 mcg, Oral, QAC breakfast, Mikey College T, MD, 250 mcg at 02/19/23 4696   loratadine (CLARITIN) tablet 10 mg, 10 mg, Oral, Daily, Mikey College T, MD, 10 mg at 02/18/23 0813   magnesium gluconate (MAGONATE) tablet 500 mg, 500 mg, Oral, Daily, Mosetta Pigeon, MD, 500 mg at 02/18/23 0827   methylPREDNISolone sodium succinate (SOLU-MEDROL) 125 mg/2 mL injection 125 mg, 125 mg, Intravenous, Once PRN, Creig Hines, MD   mometasone-formoterol [facility]) 200-5 MCG/ACT inhaler 2 puff, 2 puff, Inhalation, BID, Mikey College T, MD, 2 puff at 02/18/23 2154   pantoprazole (PROTONIX) EC tablet 40 mg, 40 mg, Oral, Daily, Mikey College T, MD, 40 mg at 02/18/23 0811   polyethylene glycol (MIRALAX / GLYCOLAX) packet 17 g, 17 g, Oral, Daily, Borders, Daryl Eastern, NP, 17 g at 02/14/23 2952   sodium bicarbonate tablet 650 mg, 650 mg, Oral, TID, Kolluru, Sarath, MD, 650 mg at 02/18/23 2105   sodium chloride flush (NS) 0.9 % injection 10 mL, 10 mL, Intracatheter, PRN, Creig Hines, MD   sodium chloride flush (NS) 0.9 % injection 10 mL, 10 mL, Intracatheter, PRN, Creig Hines, MD   sodium chloride flush (NS) 0.9 % injection 3 mL, 3 mL, Intracatheter, PRN, Creig Hines, MD   sodium chloride flush (NS) 0.9 % injection 3 mL, 3 mL, Intracatheter, PRN, Creig Hines, MD   umeclidinium bromide (INCRUSE ELLIPTA) 62.5 MCG/ACT 1 puff, 1 puff, Inhalation, Daily, Mikey College T, MD, 1 puff at 02/18/23 0815   zolpidem (AMBIEN) tablet 5-10 mg, 5-10 mg, Oral, QHS PRN, Mikey College T, MD, 10 mg at 02/18/23  2113  Physical exam:  Vitals:   02/18/23 1420 02/18/23 1500 02/18/23 2002 02/19/23 0700  BP:  129/69 (!) 120/59 113/75  Pulse:  92 95 94  Resp:  19 18 18   Temp:  98.2 F (36.8 C) 98.1 F (36.7 C) 98 F (36.7 C)  TempSrc:  Oral  Oral  SpO2: 92% 93% 97% 93%  Weight:      Height:       Physical Exam Cardiovascular:     Rate and Rhythm: Normal rate and regular rhythm.     Heart sounds: Normal heart sounds.  Pulmonary:     Effort: Pulmonary effort is normal.     Breath sounds: Normal breath sounds.  Abdominal:     General: Bowel sounds are normal.     Palpations: Abdomen is soft.  Musculoskeletal:     Right lower leg: No edema.     Left lower leg: No edema.  Skin:    General: Skin is warm and dry.  Neurological:     Mental Status: She is alert and oriented to person, place, and time.         Latest Ref Rng & Units 02/19/2023    5:41 AM  CMP  Glucose 70 - 99 mg/dL 841   BUN 6 - 20 mg/dL 29   Creatinine 3.24 - 1.00 mg/dL 4.01   Sodium 027 -  145 mmol/L 133   Potassium 3.5 - 5.1 mmol/L 3.9   Chloride 98 - 111 mmol/L 104   CO2 22 - 32 mmol/L 21   Calcium 8.9 - 10.3 mg/dL 8.5       Latest Ref Rng & Units 02/19/2023    5:41 AM  CBC  WBC 4.0 - 10.5 K/uL 7.8   Hemoglobin 12.0 - 15.0 g/dL 7.1   Hematocrit 82.9 - 46.0 % 21.3   Platelets 150 - 400 K/uL 341     @IMAGES @  DG Chest Port 1 View  Result Date: 02/18/2023 CLINICAL DATA:  History of multiple myeloma and pulmonary embolus. EXAM: PORTABLE CHEST 1 VIEW COMPARISON:  February 16, 2023. FINDINGS: Stable cardiomegaly. Continued presence of wedge-shaped consolidation within the left midlung concerning for pneumonia or infarction as noted on prior exam. Mild central pulmonary vascular congestion is noted. Left basilar atelectasis or infiltrate and effusion cannot be excluded. Postsurgical changes are seen in thoracic spine. IMPRESSION: Continued presence wedge-shaped consolidation with left midlung concerning for pneumonia  or infarction as noted on prior exam. Mild central pulmonary vascular congestion. Left basilar atelectasis or infiltrate and pleural effusion cannot be excluded. Electronically Signed   By: Lupita Raider M.D.   On: 02/18/2023 12:43   NM Pulmonary Perfusion  Result Date: 02/18/2023 CLINICAL DATA:  Concern for pulmonary embolism.  Short of breath. EXAM: NUCLEAR MEDICINE PERFUSION LUNG SCAN TECHNIQUE: Perfusion images were obtained in multiple projections after intravenous injection of radiopharmaceutical. RADIOPHARMACEUTICALS:  4.5 mCi Tc-29m MAA COMPARISON:  Chest radiograph 02/18/2023 FINDINGS: Marked decreased relative perfusion to the entire LEFT lung is favored related to attenuation artifact from large LEFT effusion seen on comparison radiograph. No wedge-shaped peripheral perfusion defects within LEFT or RIGHT lung to suggest acute pulmonary embolism. IMPRESSION: 1. No evidence acute pulmonary embolism. 2. Decreased relative activity in the LEFT lung is favor related to large LEFT effusion. Electronically Signed   By: Genevive Bi M.D.   On: 02/18/2023 12:40   DG Chest Port 1 View  Result Date: 02/16/2023 CLINICAL DATA:  Fever of unknown origin, history of multiple myeloma EXAM: PORTABLE CHEST 1 VIEW COMPARISON:  02/10/2023, 02/12/2023 FINDINGS: Single frontal view of the chest demonstrates stable enlargement the cardiac silhouette. Chronic vascular congestion. Nodular areas of consolidation are again seen bilaterally, unchanged since recent PET scan. There is a new peripheral area of wedge-shaped consolidation within the left midlung, measuring up to 6.3 cm. Small left pleural effusion. No pneumothorax. Postsurgical changes from thoracic fusion. Destructive lesions of the midthoracic spine are not well visualized on this x-ray. IMPRESSION: 1. New peripheral area of wedge-shaped consolidation within the left mid lung, most consistent with focal pneumonia or pulmonary infarct given rapid development  since recent PET CT. 2. Small left pleural effusion. 3. Nodular areas of consolidation bilaterally, which may be inflammatory or infectious. No change since recent PET scan. 4. Chronic vascular congestion. Electronically Signed   By: Sharlet Salina M.D.   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 EXAM: NUCLEAR MEDICINE PET WHOLE BODY TECHNIQUE: 10.1 mCi F-18 FDG was injected intravenously. Full-ring PET imaging was performed from the head to foot after the radiotracer. CT data was obtained and used for attenuation correction and anatomic localization. Fasting blood glucose: 100 mg/dl COMPARISON:  CT chest 56/21/3086 FINDINGS: Mediastinal blood pool activity: SUV  max HEAD/NECK: No hypermetabolic activity in the scalp. No hypermetabolic cervical lymph nodes. Incidental CT findings: none CHEST: At the LEFT paraspinal resection site (T6-T7), there is residual fluid and soft tissue density tissue measuring 6.1 x 3.7 cm. This tissue has no significant metabolic activity. Activity is equal to background blood pool activity. Posterior fusion at this level. No hypermetabolic tissue within the thorax to suggest malignancy within the bones or soft tissue. There are several small nodules in the LEFT lung apex (image 54/6) without metabolic activity. These are new from comparison CT 01/24/2023 and therefore favored foci of infection or inflammation. Small bilateral pleural effusions are present greater on the LEFT. Incidental CT findings: none ABDOMEN/PELVIS: There is moderate metabolic activity through the GE junction. There is postsurgical anatomy of the proximal stomach. No abnormal metabolic activity liver. Scattered physiologic activity in the bowel. No hypermetabolic lymph nodes in the abdomen pelvis. Incidental CT findings: Foley catheter in  bladder. SKELETON: Hypermetabolic lesion in the L4 vertebral body with SUV max equal 5.4. associated lytic lesion measuring 19 mm on image 121/series 6. EXTREMITIES: No abnormal hypermetabolic activity in the lower extremities. Incidental CT findings: No sclerotic or lytic lesions on CT imaging. IMPRESSION: 1. Solitary hypermetabolic lytic lesion in the L4 vertebral body consistent multiple myeloma. Metabolic activity is moderate. 2. Residual soft tissue density at the LEFT paraspinal resection site (T6-T7) with no significant metabolic activity. Favor benign postsurgical tissue. 3. Small bilateral pleural effusions greater on the LEFT. 4. Small nodules in the LEFT lung apex without metabolic activity are favored foci of infection or inflammation. 5. Moderate metabolic activity through the GE junction is favored physiologic or inflammatory. Electronically Signed   By: Genevive Bi M.D.   On: 02/12/2023 13:47   US RENAL  Result Date: 02/10/2023 CLINICAL DATA:  AKI EXAM: RENAL / URINARY TRACT ULTRASOUND COMPLETE COMPARISON:  None Available. FINDINGS: Right Kidney: Renal measurements: 12.5 cm x 5.6 cm x 4.6 cm = volume: 165 mL. Parenchymal echogenicity is normal. There is no hydronephrosis. Left Kidney: Renal measurements: 12.6 cm x 6.4 cm x 5.0 cm = volume: 213 mL. Parenchymal echogenicity is normal. There is no hydronephrosis. Bladder: Decompressed by a Foley catheter. Other: None. IMPRESSION: Unremarkable renal ultrasound. Electronically Signed   By: Lesia Hausen M.D.   On: 02/10/2023 17:03   DG Chest 1 View  Result Date: 02/10/2023 CLINICAL DATA:  Acute kidney injury. Sodium 120. Elevated creatinine. EXAM: CHEST  1 VIEW COMPARISON:  02/01/2023 FINDINGS: Shallow inspiration. Cardiac enlargement. Hazy interstitial infiltrates throughout both lungs most likely representing edema although possibly interstitial pneumonia or chronic fibrosis. Small left pleural effusion with basilar atelectasis or  consolidation. This could indicate pneumonia in the appropriate clinical setting. No pneumothorax. Postoperative changes in the thoracic spine. No significant change since prior study. IMPRESSION: Cardiac enlargement with probable interstitial pulmonary edema. Small left pleural effusion with basilar atelectasis or consolidation. Similar appearance to previous study. Electronically Signed   By: Burman Nieves M.D.   On: 02/10/2023 15:50   IR BONE MARROW BIOPSY & ASPIRATION  Result Date: 02/05/2023 INDICATION: Hematologic abnormality EXAM: Bone marrow aspiration and core biopsy using fluoroscopic guidance MEDICATIONS: None. ANESTHESIA/SEDATION: Moderate (conscious) sedation was employed during this procedure. A total of Versed 1.5 mg and Fentanyl 75 mcg was administered intravenously. Moderate Sedation Time: 10 minutes. The patient's level of consciousness and vital signs were monitored continuously by radiology nursing throughout the procedure under my direct supervision. FLUOROSCOPY TIME:  Fluoroscopy Time: 0.7 minutes (13 mGy)  COMPLICATIONS: None immediate. PROCEDURE: Informed written consent was obtained from the patient after a thorough discussion of the procedural risks, benefits and alternatives. All questions were addressed. Maximal Sterile Barrier Technique was utilized including caps, mask, sterile gowns, sterile gloves, sterile drape, hand hygiene and skin antiseptic. A timeout was performed prior to the initiation of the procedure. The patient was placed prone on the exam table. Limited fluoroscopy of the pelvis was performed for planning purposes. Skin entry site was marked, and the overlying skin was prepped and draped in the standard sterile fashion. Local analgesia was obtained with 1% lidocaine. Using fluoroscopic guidance, an 11 gauge needle was advanced just deep to the cortex of the right posterior ilium. Subsequently, bone marrow aspiration and core biopsy were performed. Specimens were  submitted to lab/pathology for handling. Hemostasis was achieved with manual pressure, and a clean dressing was placed. The patient tolerated the procedure well without immediate complication. IMPRESSION: Successful bone marrow aspiration and core biopsy of the right posterior ilium. Electronically Signed   By: Olive Bass M.D.   On: 02/05/2023 15:35   CT HEAD WO CONTRAST ( )  Result Date: 02/03/2023 CLINICAL DATA:  Mental status change, unknown cause. Acute hypoxic respiratory failure EXAM: CT HEAD WITHOUT CONTRAST TECHNIQUE: Contiguous axial images were obtained from the base of the skull through the vertex without intravenous contrast. RADIATION DOSE REDUCTION: This exam was performed according to the departmental dose-optimization program which includes automated exposure control, adjustment of the mA and/or kV according to patient size and/or use of iterative reconstruction technique. COMPARISON:  Bone survey 10/09/2022. FINDINGS: Brain: No acute intracranial abnormality. Specifically, no hemorrhage, hydrocephalus, mass lesion, acute infarction, or significant intracranial injury. Vascular: No hyperdense vessel or unexpected calcification. Skull: Focal lucent lesions throughout the skull. 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 These lytic lesions were not in the skull on the skull x-ray from 10/09/2022. Appearance is concerning for possible multiple myeloma. No fracture. Sinuses/Orbits: No acute findings Other: None IMPRESSION: No acute intracranial abnormality. Numerous scattered focal lucent lesions throughout the skull. These are new since prior bone survey from 10/09/2022 and concerning for multiple myeloma. Electronically Signed   By: Charlett Nose M.D.   On: 02/03/2023 16:30   DG Chest Port 1 View  Result Date: 02/01/2023 CLINICAL DATA:  Hypoxia EXAM: PORTABLE CHEST 1 VIEW COMPARISON:  01/30/2023 FINDINGS: Bilateral diffuse interstitial thickening and patchy  alveolar airspace opacities at the right lung base and left mid lower lung. No pleural effusion or pneumothorax. Stable cardiomediastinal silhouette. No acute osseous abnormality. Posterior spinal fusion of the upper and midthoracic spine. IMPRESSION: 1. Bilateral diffuse interstitial thickening and patchy alveolar airspace opacities at the right lung base and left mid lower lung concerning for pulmonary edema. Electronically Signed   By: Elige Ko M.D.   On: 02/01/2023 10:13   ECHOCARDIOGRAM COMPLETE  Result Date: 01/31/2023    ECHOCARDIOGRAM REPORT   Patient Name:   Paula Garrett Date of Exam: 01/31/2023 Medical Rec #:  [id number]      Height:       62.0 in Accession #:    [id number]     Weight:       170.0 lb Date of Birth:  July 28, 1965      BSA:          1.784 m Patient Age:    57 years       BP:           106/57 mmHg  Patient Gender: F              HR:           110 bpm. Exam Location:  [facility] Procedure: 2D Echo, Cardiac Doppler and Color Doppler Indications:     Dyspnea  History:         Patient has no prior history of Echocardiogram examinations.                  Signs/Symptoms:Dyspnea, Chest Pain, Fatigue and Shortness of                  Breath; Risk Factors:Hypertension, Sleep Apnea and Current                  Smoker.  Sonographer:     Mikki Harbor Referring Phys:  [id number] Alford Highland Diagnosing Phys: Julien Nordmann MD  Sonographer Comments: Image acquisition challenging due to respiratory motion. IMPRESSIONS  1. Left ventricular ejection fraction, by estimation, is 60 to 65%. The left ventricle has normal function. The left ventricle has no regional wall motion abnormalities. There is mild left ventricular hypertrophy. Left ventricular diastolic parameters are consistent with Grade I diastolic dysfunction (impaired relaxation).  2. Right ventricular systolic function is normal. The right ventricular size is normal. There is normal pulmonary artery systolic pressure. The estimated right  ventricular systolic pressure is 30.9 mmHg.  3. Left atrial size was mildly dilated.  4. The mitral valve is normal in structure. No evidence of mitral valve regurgitation. No evidence of mitral stenosis.  5. The aortic valve is tricuspid. Aortic valve regurgitation is not visualized. No aortic stenosis is present.  6. The inferior vena cava is normal in size with greater than 50% respiratory variability, suggesting right atrial pressure of 3 mmHg. FINDINGS  Left Ventricle: Left ventricular ejection fraction, by estimation, is 60 to 65%. The left ventricle has normal function. The left ventricle has no regional wall motion abnormalities. The left ventricular internal cavity size was normal in size. There is  mild left ventricular hypertrophy. Left ventricular diastolic parameters are consistent with Grade I diastolic dysfunction (impaired relaxation). Right Ventricle: The right ventricular size is normal. No increase in right ventricular wall thickness. Right ventricular systolic function is normal. There is normal pulmonary artery systolic pressure. The tricuspid regurgitant velocity is 2.64 m/s, and  with an assumed right atrial pressure of 3 mmHg, the estimated right ventricular systolic pressure is 30.9 mmHg. Left Atrium: Left atrial size was mildly dilated. Right Atrium: Right atrial size was normal in size. Pericardium: There is no evidence of pericardial effusion. Mitral Valve: The mitral valve is normal in structure. No evidence of mitral valve regurgitation. No evidence of mitral valve stenosis. MV peak gradient, 8.0 mmHg. The mean mitral valve gradient is 4.0 mmHg. Tricuspid Valve: The tricuspid valve is normal in structure. Tricuspid valve regurgitation is mild . No evidence of tricuspid stenosis. Aortic Valve: The aortic valve is tricuspid. Aortic valve regurgitation is not visualized. No aortic stenosis is present. Aortic valve mean gradient measures 9.0 mmHg. Aortic valve peak gradient measures 18.4  mmHg. Aortic valve area, by VTI measures 3.04  cm. Pulmonic Valve: The pulmonic valve was normal in structure. Pulmonic valve regurgitation is not visualized. No evidence of pulmonic stenosis. Aorta: The aortic root is normal in size and structure. Venous: The inferior vena cava is normal in size with greater than 50% respiratory variability, suggesting right atrial pressure of 3 mmHg. IAS/Shunts: No atrial level shunt detected  by color flow Doppler.  LEFT VENTRICLE PLAX 2D LVIDd:         5.80 cm   Diastology LVIDs:         3.60 cm   LV e' medial:    9.36 cm/s LV PW:         0.90 cm   LV E/e' medial:  9.7 LV IVS:        1.00 cm   LV e' lateral:   10.00 cm/s LVOT diam:     2.20 cm   LV E/e' lateral: 9.1 LV SV:         99 LV SV Index:   55 LVOT Area:     3.80 cm  RIGHT VENTRICLE RV Basal diam:  3.45 cm RV Mid diam:    3.00 cm RV S prime:     27.40 cm/s TAPSE (M-mode): 3.3 cm LEFT ATRIUM             Index        RIGHT ATRIUM           Index LA diam:        4.80 cm 2.69 cm/m   RA Area:     18.90 cm LA Vol (A2C):   59.6 ml 33.41 ml/m  RA Volume:   54.10 ml  30.32 ml/m LA Vol (A4C):   66.9 ml 37.50 ml/m LA Biplane Vol: 65.0 ml 36.43 ml/m  AORTIC VALVE AV Area (Vmax):    3.09 cm AV Area (Vmean):   3.04 cm AV Area (VTI):     3.04 cm AV Vmax:           214.50 cm/s AV Vmean:          136.750 cm/s AV VTI:            0.326 m AV Peak Grad:      18.4 mmHg AV Mean Grad:      9.0 mmHg LVOT Vmax:         174.50 cm/s LVOT Vmean:        109.500 cm/s LVOT VTI:          0.260 m LVOT/AV VTI ratio: 0.80  AORTA Ao Root diam: 3.20 cm MITRAL VALVE                TRICUSPID VALVE MV Area (PHT): 3.72 cm     TR Peak grad:   27.9 mmHg MV Area VTI:   2.90 cm     TR Vmax:        264.00 cm/s MV Peak grad:  8.0 mmHg MV Mean grad:  4.0 mmHg     SHUNTS MV Vmax:       1.41 m/s     Systemic VTI:  0.26 m MV Vmean:      91.6 cm/s    Systemic Diam: 2.20 cm MV Decel Time: 204 msec MV E velocity: 90.70 cm/s MV A velocity: 117.00 cm/s MV E/A  ratio:  0.78 Julien Nordmann MD Electronically signed by Julien Nordmann MD Signature Date/Time: 01/31/2023/5:43:31 PM    Final    DG Thoracic Spine 2 View  Result Date: 01/31/2023 CLINICAL DATA:  Back pain. 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 EXAM: THORACIC SPINE 2 VIEWS COMPARISON:  MRI of the cervicothoracic spine 01/24/2023. Intraoperative imaging of the thoracic spine 01/28/2023. Chest CTA 01/24/2023. 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 Counting superiorly from the T12 level, the patient  has undergone midthoracic spinal decompression with posterior pedicle screw and rod fusion from T4 through T9. The hardware appears intact and well positioned. Chronic superior endplate compression deformity at T11 is unchanged. No acute osseous complications are identified. There is mild atelectasis at both lung bases. IMPRESSION: No demonstrated acute findings status post recent midthoracic spinal decompression and fusion. Electronically Signed   By: Carey Bullocks M.D.   On: 01/31/2023 16:27   DG Chest Port 1 View  Result Date: 01/30/2023 CLINICAL DATA:  Cough, shortness of breath EXAM: PORTABLE CHEST 1 VIEW COMPARISON:  01/24/2023 FINDINGS: Cardiomegaly. Diffuse bilateral interstitial pulmonary opacity and trace pleural effusions. Interval thoracic fusion. IMPRESSION: Cardiomegaly with diffuse bilateral interstitial pulmonary opacity and trace pleural effusions, most consistent with edema. Electronically Signed   By: Jearld Lesch M.D.   On: 01/30/2023 14:07   DG Thoracic Spine 2 View  Result Date: 01/28/2023 CLINICAL DATA:  Elective surgery. T4 through T9 fusion of biopsy for lesion with intra 3D imaging. EXAM: THORACIC SPINE 2 VIEWS COMPARISON:  Preoperative imaging. FINDINGS: Intraoperative 3D imaging of the thoracic spine obtained. Posterior rod with pedicle screws at multiple levels. Known paravertebral mass better  assessed on preoperative imaging. No fluoroscopic spot views provided. Fluoroscopy time 1 minutes 30 seconds. Dose is 97.045 mGy IMPRESSION: Intraoperative 3D imaging of the thoracic spine. Electronically Signed   By: Narda Rutherford M.D.   On: 01/28/2023 16:28   DG C-Arm 1-60 Min-No Report  Result Date: 01/28/2023 Fluoroscopy was utilized by the requesting physician.  No radiographic interpretation.   DG C-Arm 1-60 Min-No Report  Result Date: 01/28/2023 Fluoroscopy was utilized by the requesting physician.  No radiographic interpretation.   DG C-Arm 1-60 Min-No Report  Result Date: 01/28/2023 Fluoroscopy was utilized by the requesting physician.  No radiographic interpretation.   DG C-Arm 1-60 Min-No Report  Result Date: 01/28/2023 Fluoroscopy was utilized by the requesting physician.  No radiographic interpretation.   MR THORACIC SPINE W WO CONTRAST  Result Date: 01/24/2023 CLINICAL DATA:  Soft tissue mass with bony destruction involving the left seventh rib and T6 and T7 vertebral bodies. EXAM: MRI CERVICAL AND THORACIC SPINE WITHOUT AND WITH CONTRAST TECHNIQUE: Multiplanar and multiecho pulse sequences of the cervical spine, to include the craniocervical junction and cervicothoracic junction, and the thoracic spine, were obtained without and with intravenous contrast. CONTRAST:  7mL GADAVIST GADOBUTROL 1 MMOL/ML IV SOLN COMPARISON:  Same day CTA chest, cervical and thoracic spine MRI 07/20/2020 FINDINGS: MRI CERVICAL SPINE FINDINGS Alignment: There is trace retrolisthesis of C3 on C4 and trace anterolisthesis of C5 on C6. Vertebrae: Vertebral body heights are preserved. Background marrow signal is normal. A T1 hypointense/T2 hyperintense lesion in the clivus is unchanged since 2022, favored benign. There is no suspicious marrow signal abnormality or marrow edema in the cervical spine. There is no abnormal marrow enhancement in the cervical spine. There is a 6 mm enhancing lesion in the right  occipital calvarium which was not seen on the prior cervical spine MRI from 2022; however, this area may not has been included within the field of view. Cord: Normal in signal and morphology. Posterior Fossa, vertebral arteries, paraspinal tissues: The imaged posterior fossa is unremarkable. The vertebral artery flow voids are normal. The paraspinal soft tissues are unremarkable. Disc levels: There is overall mild degenerative change in the cervical spine without significant spinal canal or neural foraminal stenosis. MRI THORACIC SPINE FINDINGS Alignment:  Normal. Vertebrae: Background marrow signal is normal. There is infiltrative T1 hypointensity  occupying most of the T6 and T7 vertebral bodies extending into the left pedicles and facets. There is a bulky soft tissue mass in the adjacent left paraspinal/subpleural soft tissues with destruction of the seventh rib. The bulk of the soft tissue component measures up to proximally 7.2 cm x 3.4 cm x 2.7 cm. 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 There is mild loss of vertebral body height at both levels consistent with associated pathologic fractures. There is compression deformity of the T11 vertebral body with up to approximately 30% loss of vertebral body height anteriorly and minimal bony retropulsion. There is faint edema along the superior endplate consistent with acute to subacute chronicity. This fracture may be pathologic. There are probable additional lesions involving multiple additional ribs (for example 25-21, 25-24). The above findings are new since the MRI from 2022. Cord:  There is no cord signal abnormality or abnormal enhancement. Paraspinal and other soft tissues: Unremarkable, aside from the bulky paraspinal tumor  described above. Disc levels: There is a small disc protrusion at T10-T11 without significant spinal canal or neural foraminal stenosis. Otherwise, there is overall minimal background degenerative change without significant spinal canal or neural foraminal stenosis. IMPRESSION: 1. Large soft tissue mass in the left paraspinal soft tissues at T6-T7 with destruction of the seventh rib and invasion of the T6 and T7 vertebral bodies with involvement of the left posterior elements as well as epidural tumor in the spinal canal resulting in moderate spinal canal stenosis without frank cord compression or cord edema. 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 2. Acute to subacute compression fracture of the T11 vertebral body with up to approximately 30% loss of vertebral body height and minimal bony retropulsion, possibly pathologic. 3. Small enhancing lesion in the right occipital calvarium, not definitely present on the prior cervical spine from 2022 (though possibly not included within the field of view on that study), suspicious for an additional metastatic or myeloma lesion. 4. Additional probable lesions involving multiple additional ribs. 5. No acute or suspicious finding in the cervical spine. Electronically Signed   By: Lesia Hausen M.D.   On: 01/24/2023 14:33   MR Cervical Spine W and Wo Contrast  Result Date: 01/24/2023 CLINICAL DATA:  Soft tissue mass with bony destruction involving the left seventh rib and T6 and T7 vertebral bodies. EXAM: MRI CERVICAL AND THORACIC SPINE WITHOUT AND WITH CONTRAST TECHNIQUE: Multiplanar and multiecho pulse sequences of the cervical spine, to include the craniocervical junction and cervicothoracic junction, and the thoracic spine, were obtained without and with intravenous contrast. CONTRAST:  7mL GADAVIST GADOBUTROL 1 MMOL/ML IV SOLN COMPARISON:  Same day CTA chest, cervical and thoracic spine MRI 07/20/2020 FINDINGS: MRI  CERVICAL SPINE FINDINGS Alignment: There is trace retrolisthesis of C3 on C4 and trace anterolisthesis of C5 on C6. Vertebrae: Vertebral body heights are preserved. Background marrow signal is normal. A T1 hypointense/T2 hyperintense lesion in the clivus is unchanged since 2022, favored benign. There is no suspicious marrow signal abnormality or marrow edema in the cervical spine. There is no abnormal marrow enhancement in the cervical spine. There is a 6 mm enhancing lesion in the right occipital calvarium which was not seen on the prior cervical spine MRI from 2022; however, this  area may not has been included within the field of view. Cord: Normal in signal and morphology. Posterior Fossa, vertebral arteries, paraspinal tissues: The imaged posterior fossa is unremarkable. The vertebral artery flow voids are normal. The paraspinal soft tissues are unremarkable. Disc levels: There is overall mild degenerative change in the cervical spine without significant spinal canal or neural foraminal stenosis. MRI THORACIC SPINE FINDINGS Alignment:  Normal. Vertebrae: Background marrow signal is normal. There is infiltrative T1 hypointensity occupying most of the T6 and T7 vertebral bodies extending into the left pedicles and facets. There is a bulky soft tissue mass in the adjacent left paraspinal/subpleural soft tissues with destruction of the seventh rib. The bulk of the soft tissue component measures up to proximally 7.2 cm x 3.4 cm x 2.7 cm. 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 There is mild loss of vertebral body height at both levels consistent with associated pathologic fractures. There is compression deformity of the T11 vertebral body with up to approximately 30% loss of  vertebral body height anteriorly and minimal bony retropulsion. There is faint edema along the superior endplate consistent with acute to subacute chronicity. This fracture may be pathologic. There are probable additional lesions involving multiple additional ribs (for example 25-21, 25-24). The above findings are new since the MRI from 2022. Cord:  There is no cord signal abnormality or abnormal enhancement. Paraspinal and other soft tissues: Unremarkable, aside from the bulky paraspinal tumor described above. Disc levels: There is a small disc protrusion at T10-T11 without significant spinal canal or neural foraminal stenosis. Otherwise, there is overall minimal background degenerative change without significant spinal canal or neural foraminal stenosis. IMPRESSION: 1. Large soft tissue mass in the left paraspinal soft tissues at T6-T7 with destruction of the seventh rib and invasion of the T6 and T7 vertebral bodies with involvement of the left posterior elements as well as epidural tumor in the spinal canal resulting in moderate spinal canal stenosis without frank cord compression or cord edema. 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 2. Acute to subacute compression fracture of the T11 vertebral body with up to approximately 30% loss of vertebral body height and minimal bony retropulsion, possibly pathologic. 3. Small enhancing lesion in the right occipital calvarium, not definitely present on the prior cervical spine from 2022 (though possibly not included within the field of view on that study), suspicious for an additional metastatic or myeloma lesion. 4. Additional probable lesions involving multiple additional ribs. 5. No acute or suspicious finding in the cervical spine. Electronically Signed   By: Lesia Hausen M.D.   On: 01/24/2023 14:33   CT Angio Chest PE W and/or Wo Contrast  Result Date: 01/24/2023 CLINICAL DATA:  Chest pain and shortness of  breath for a month. EXAM: CT ANGIOGRAPHY CHEST WITH CONTRAST TECHNIQUE: Multidetector CT imaging of the chest was performed using the standard protocol during bolus administration of intravenous contrast. Multiplanar CT image reconstructions and MIPs were obtained to evaluate the vascular anatomy. RADIATION DOSE REDUCTION: This exam was performed according to the departmental dose-optimization program which includes automated exposure control, adjustment of the mA and/or kV according to patient size and/or use of iterative reconstruction technique. CONTRAST:  75mL OMNIPAQUE IOHEXOL 350 MG/ML SOLN COMPARISON:  X-ray  01/24/2023 and older.  Previous CT January 2017 FINDINGS: Cardiovascular: The thoracic aorta has a normal course and caliber with minimal calcified plaque. There is a bovine type aortic arch, normal variant. Coronary artery calcifications are seen. Please correlate for other coronary risk factors. Heart is nonenlarged. There is slight wall thickening of the left ventricle. Prominent fat along the intra-atrial septum. Pulmonary arteries show some slight enlargement centrally. Please correlate for any evidence of pulmonary artery hypertension. There is heterogeneous enhancement of the pulmonary arterial tree limiting evaluation for emboli. No obvious large and central embolus although several areas are nondiagnostic. Mediastinum/Nodes: Surgical changes identified with a moderate hiatal hernia. Small thyroid gland. No specific abnormal lymph node enlargement identified in the axillary region, hilum or mediastinum. There are some small nodes identified in the posterior mediastinum, posterior to the descending thoracic aorta such as series 4, image 86 but not pathologic by size criteria. Lungs/Pleura: There is some linear opacity lung bases likely scar or atelectasis. No consolidation. Breathing motion identified. No pleural effusion or pneumothorax. 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 Benign hepatic cysts as well. Musculoskeletal: There is large destructive mass involving the posterior aspect of the left seventh rib with extension to involve the central canal of the spine in the associated vertebral bodies of T6 and T7. Cord compression is possible. This mass is estimated in diameter on series 4, image 65 at 8.5 by 4.3 cm. Slight extension along the extrapleural space in the posterior mediastinum as well. There is question of some subtle other lucent lesions identified along the spine but indeterminate. Critical Value/emergent results were called by telephone at the time of interpretation on 01/24/2023 at 8:41 am to provider [facility] , who verbally acknowledged these results. Review of the MIP images confirms the above findings. IMPRESSION: Aggressive soft tissue mass with bone destruction involving the left seventh rib as well as the T6 and T7 vertebral bodies and central canal. A neoplastic process is possible. There also is a possibility of significant cord compression with canal encroachment along the spine. Recommend further evaluation with spine MRI with and without contrast and a whole-body bone scan. There appears to be some other subtle lucent bone lesions along the spine and sternum. Please correlate for any history of known malignancy including such processes as myeloma or other. Poor opacification of the pulmonary arterial tree limiting evaluation. Several areas are non diagnostic for pulmonary emboli. No obvious large and central embolus. Aortic Atherosclerosis (ICD10-I70.0). Electronically Signed   By: Karen Kays M.D.   On: 01/24/2023 11:47   DG Chest 2 View  Result Date: 01/24/2023 CLINICAL DATA:  Chest pain EXAM: CHEST - 2 VIEW COMPARISON:  Chest x-ray dated January 07, 2023 FINDINGS: The heart size and mediastinal contours are within normal limits. Small hiatal hernia. Both lungs are clear. Pleural-based nodular opacity of the left  posterior hemithorax. The visualized skeletal structures are unremarkable. IMPRESSION: Pleural-based nodular opacity of the left posterior hemithorax. Recommend further evaluation with contrast enhanced chest CT. Electronically Signed   By: Allegra Lai M.D.   On: 01/24/2023 09:38     Assessment and plan- Patient is a 58 y.o. female with newly diagnosed IgG lambda multiple myeloma admitted for acute kidney injury  IgG lambda multiple myeloma: Since she has been afebrile for over 36 hours she will receive cycle 1 day 8 of Cytoxan and Velcade today.  Kidney functions are improving and given that she is on 2 L  of oxygen now her IV fluids have been discontinued.  2.  Normocytic anemia: Secondary to multiple myeloma: Transfuse if hemoglobin less than 7 irradiated blood products only  3.  Neoplasm related pain: Continue fentanyl and as needed oxycodone  4.  Acute hypoxic respiratory failure:Chest x-ray showed wedge-shaped consolidation in the left midlung concerning for infarction versus pneumonia.  Given the patient is immunocompromise secondary to ongoing chemotherapy I would recommend starting either oral or IV antibiotics.  I did discontinue Eliquis later in the day after VQ scan results were back which did not show any evidence of pulmonary embolism  5.  Depression: On escitalopram.  Palliative care is also following Visit Diagnosis 1. Multiple myeloma not having achieved remission (HCC)   2. Palliative care encounter   3. Hyperuricemia   4. Constipation, unspecified constipation type      Dr. Owens Shark, MD, MPH [facility] [phone] 02/19/2023 8:29 AM

## 2023-02-19 NOTE — Progress Notes (Signed)
O2 sat at rest on room air: _93%_  If this is 88% or below, stop, no further testing is needed.  Test with exertion:  O2 sat at exertion on room air: _82%_, then  O2 sat at exertion on __3_ L/min of O2: _90%_.

## 2023-02-20 ENCOUNTER — Encounter: Payer: Self-pay | Admitting: Oncology

## 2023-02-20 ENCOUNTER — Inpatient Hospital Stay: Payer: Managed Care, Other (non HMO) | Admitting: Hospice and Palliative Medicine

## 2023-02-20 DIAGNOSIS — N179 Acute kidney failure, unspecified: Secondary | ICD-10-CM | POA: Diagnosis not present

## 2023-02-20 LAB — TYPE AND SCREEN
ABO/RH(D): A POS
Antibody Screen: NEGATIVE
Unit division: 0

## 2023-02-20 LAB — BPAM RBC
Blood Product Expiration Date: 202409142359
ISSUE DATE / TIME: 202409041350
Unit Type and Rh: 6200

## 2023-02-20 LAB — BASIC METABOLIC PANEL
Anion gap: 10 (ref 5–15)
BUN: 30 mg/dL — ABNORMAL HIGH (ref 6–20)
CO2: 22 mmol/L (ref 22–32)
Calcium: 8.4 mg/dL — ABNORMAL LOW (ref 8.9–10.3)
Chloride: 102 mmol/L (ref 98–111)
Creatinine, Ser: 1.63 mg/dL — ABNORMAL HIGH (ref 0.44–1.00)
GFR, Estimated: 36 mL/min — ABNORMAL LOW (ref >60)
Glucose, Bld: 93 mg/dL (ref 70–99)
Potassium: 3.7 mmol/L (ref 3.5–5.1)
Sodium: 134 mmol/L — ABNORMAL LOW (ref 135–145)

## 2023-02-20 LAB — CBC
HCT: 25.1 % — ABNORMAL LOW (ref 36.0–46.0)
Hemoglobin: 8.2 g/dL — ABNORMAL LOW (ref 12.0–15.0)
MCH: 30.6 pg (ref 26.0–34.0)
MCHC: 32.7 g/dL (ref 30.0–36.0)
MCV: 93.7 fL (ref 80.0–100.0)
Platelets: 354 10*3/uL (ref 150–400)
RBC: 2.68 MIL/uL — ABNORMAL LOW (ref 3.87–5.11)
RDW: 14.9 % (ref 11.5–15.5)
WBC: 6.8 10*3/uL (ref 4.0–10.5)
nRBC: 0 % (ref 0.0–0.2)

## 2023-02-20 LAB — MAGNESIUM: Magnesium: 1.7 mg/dL (ref 1.7–2.4)

## 2023-02-20 MED ORDER — IPRATROPIUM-ALBUTEROL 0.5-2.5 (3) MG/3ML IN SOLN
3.0000 mL | Freq: Three times a day (TID) | RESPIRATORY_TRACT | Status: AC
  Administered 2023-02-20 – 2023-02-21 (×2): 3 mL via RESPIRATORY_TRACT
  Filled 2023-02-20 (×2): qty 3

## 2023-02-20 MED ORDER — CLONAZEPAM 0.5 MG PO TABS
1.0000 mg | ORAL_TABLET | Freq: Three times a day (TID) | ORAL | Status: DC | PRN
  Administered 2023-02-20: 1 mg via ORAL
  Filled 2023-02-20: qty 2

## 2023-02-20 MED ORDER — FENTANYL 50 MCG/HR TD PT72: 1.0000 | MEDICATED_PATCH | TRANSDERMAL | 0 refills | Status: DC

## 2023-02-20 MED ORDER — HYDROMORPHONE HCL 2 MG PO TABS
2.0000 mg | ORAL_TABLET | Freq: Four times a day (QID) | ORAL | Status: DC
  Administered 2023-02-20 – 2023-02-21 (×4): 2 mg via ORAL
  Filled 2023-02-20 (×4): qty 1

## 2023-02-20 MED ORDER — IPRATROPIUM-ALBUTEROL 0.5-2.5 (3) MG/3ML IN SOLN
3.0000 mL | RESPIRATORY_TRACT | Status: DC
  Administered 2023-02-20: 3 mL via RESPIRATORY_TRACT
  Filled 2023-02-20: qty 3

## 2023-02-20 MED ORDER — IPRATROPIUM-ALBUTEROL 0.5-2.5 (3) MG/3ML IN SOLN: 3.0000 mL | Freq: Four times a day (QID) | RESPIRATORY_TRACT | 0 refills | Status: AC

## 2023-02-20 NOTE — Discharge Summary (Signed)
Physician Discharge Summary   Patient: Paula Garrett MRN: [id number]  DOB: 05-26-1966   Admit:     Date of Admission: 02/10/2023 Admitted from: home   Discharge: Date of discharge: 02/20/23 Disposition: Home Condition at discharge: fair  CODE STATUS: FULL CODE     Discharge Physician: Sunnie Nielsen, DO [facility]     PCP: Smitty Cords, DO  Recommendations for Outpatient Follow-up:  Follow up with PCP Althea Charon, Netta Neat, DO in 1-2 weeks Please obtain labs/tests: CBC, BMP in 1-2 weeks Follow ASAP w/ cancer center  Please follow up on the following pending results: none  PCP AND OTHER OUTPATIENT PROVIDERS: SEE BELOW FOR SPECIFIC DISCHARGE INSTRUCTIONS PRINTED FOR PATIENT IN ADDITION TO GENERIC AVS PATIENT INFO    Discharge Instructions     Diet - low sodium heart healthy   Complete by: As directed    Increase activity slowly   Complete by: As directed    No wound care   Complete by: As directed    No wound care   Complete by: As directed    Physician communication order   Complete by: As directed    Dexamethasone 40 mg IV to be given in clinic prior to Velcade.   Physician communication order   Complete by: As directed    Dexamethasone 40 mg IV to be given in clinic prior to Velcade.   Physician communication order   Complete by: As directed    Dexamethasone 40 mg IV to be given in clinic prior to Velcade.   Physician communication order   Complete by: As directed    Dexamethasone 40 mg IV to be given in clinic prior to Velcade.   TREATMENT CONDITIONS   Complete by: As directed    Patient should have CBC & CMP within 7 days prior to chemotherapy administration. NOTIFY MD IF: ANC < 1500, Hemoglobin < 8, PLT < 100,000,  Total Bili > 1.5, Creatinine > 1.5, ALT or  AST > 80 or if patient has unstable vital signs: Temperature >= 100.766F, SBP > 180 or < 90, RR > 30 or HR > 100.   TREATMENT CONDITIONS   Complete by: As directed     Patient should have CBC & CMP within 7 days prior to chemotherapy administration. NOTIFY MD IF: ANC < 1500, Hemoglobin < 8, PLT < 100,000,  Total Bili > 1.5, Creatinine > 1.5, ALT or  AST > 80 or if patient has unstable vital signs: Temperature >= 100.766F, SBP > 180 or < 90, RR > 30 or HR > 100.   TREATMENT CONDITIONS   Complete by: As directed    Patient should have CBC & CMP within 7 days prior to chemotherapy administration. NOTIFY MD IF: ANC < 1500, Hemoglobin < 8, PLT < 100,000,  Total Bili > 1.5, Creatinine > 1.5, ALT or  AST > 80 or if patient has unstable vital signs: Temperature >= 100.766F, SBP > 180 or < 90, RR > 30 or HR > 100.         Discharge Diagnoses: Principal Problem:   AKI (acute kidney injury) (HCC) Active Problems:   Multiple myeloma (HCC)   Shortness of breath       Hospital Course: Paula Garrett is a 57 y.o. female with multiple medical problems including hypertension, depression, anxiety, history of tobacco abuse, and multiple myeloma, who was hospitalized 01/24/2023 to 02/06/2023 with chest pain and shortness of breath and was found to have a  large soft tissue mass in the left T6-T7 paraspinal area with destruction of seventh rib and invasion of T6 and T7 vertebral bodies. Patient underwent surgery on 8/13 with postop period complicated by pain. Pathology positive for multiple myeloma.   Patient readmitted 02/10/2023 with AKI, Cr 5.27, assoc we/ hyperkalemia Nephrology consult. Renal US no concerns. AKI improving. Cr down to 1.63 on hospital day 10.   Developed fever/tachycardia 09/01, CXR c/w PNA vs pulm infarct. R/o PE. Started on abx. Needing O2   Started on inpatient chemotherapy. Requiring PRBC transfusion d/t anemia. IV infiltrating and arm swelling, d/c IV, pt is not going to need port for chemo, ok to leave IV out for now.   Pt requesting for discharge today. Pt needing O2 but refusing this on discharge. Has capacity to give informed consent/refusal and  understands risks/benefits of her decision and alternatives.    Consultants:  Nephrology Hem/Onc Palliative Care   Procedures: none      ASSESSMENT & PLAN:    AKI in the setting of multiple myeloma - improved   baseline creatinine .93,  on admission creatinine of 5.27.   Cr has improved with IVF and MM treatment Continue monitor BMP outpatient  PNA ceftriaxone and azithromycin --> augmentin + azitrho po on discharge    Acute hypoxemic respiratory failure likely due to PNA. ambulation test yesterday see RN note  Will need home O2 but is refusing  Has capacity to give informed consent/refusal and understands risks/benefits of her decision and alternatives.    Hyponatremia Likely due to renal failure, improving  Monitor BMP   Multiple myeloma HemOnc following, patient seen by Dr. Smith Robert given significant renal issues from multiple myeloma patient has been started on inpatient chemotherapy  Follow further recommendations per Dr. Smith Robert, velcade per schedule start ASA cont allopurinol   Anemia s/p 2u pRBC (irradiate) this admission monitor and transfuse to keep Hgb >7  Recent resection of Mass of spinal cord Neurosurgical procedure done on 8/13 by Dr. Katrinka Blazing - posterior segmental instrumentation T4-T9, posterior lateral arthrodesis from T4-T9, thoracic laminectomy at T6 and T7.   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  pain management as below   Chest pain Chronic back pain switched from home oxycontin to Fentanyl patch, switched from home oxycodone PRN to oral dialudid PRN. cont gabapentin increased Fentanyl patch 25 --> 50 09/04, will work on tapering down on po dilaudid  oral dilaudid 1-2 mg q6h prn   Asthma COPD cont bronchodilators   Anxiety depression on chronic benzo continue Lexapro and Klonopin   Hypothyroidism continue Synthroid    Hyperkalemia - resolved D/t AKI/ARF Monitor BMP  Mild encephalopathy, ruled  out             Discharge Instructions  Allergies as of 02/20/2023       Reactions   Hctz [hydrochlorothiazide]    Hyponatremia         Medication List     STOP taking these medications    oxyCODONE 20 mg 12 hr tablet Commonly known as: OXYCONTIN   Oxycodone HCl 10 MG Tabs   telmisartan 80 MG tablet Commonly known as: Micardis       TAKE these medications    acyclovir 400 MG tablet Commonly known as: ZOVIRAX Take 1 tablet (400 mg total) by mouth 2 (two) times daily.   AeroChamber MV inhaler Use as instructed   albuterol 108 (90 Base) MCG/ACT inhaler Commonly known as: VENTOLIN HFA Inhale 2 puffs into the lungs every  6 (six) hours as needed for wheezing or shortness of breath.   allopurinol 100 MG tablet Commonly known as: ZYLOPRIM Take 1 tablet (100 mg total) by mouth daily.   amLODipine 10 MG tablet Commonly known as: NORVASC Take 1 tablet (10 mg total) by mouth daily.   amoxicillin-clavulanate 875-125 MG tablet Commonly known as: AUGMENTIN Take 1 tablet by mouth every 12 (twelve) hours for 7 days.   aspirin EC 81 MG tablet Take 1 tablet (81 mg total) by mouth daily. Swallow whole.   azithromycin 500 MG tablet Commonly known as: ZITHROMAX Take 1 tablet (500 mg total) by mouth daily.   butalbital-acetaminophen-caffeine 50-325-40 MG tablet Commonly known as: FIORICET Take 1 tablet by mouth every 4 (four) hours as needed for headache.   cetirizine 10 MG tablet Commonly known as: ZYRTEC Take 1 tablet (10 mg total) by mouth daily.   clonazePAM 1 MG tablet Commonly known as: KLONOPIN Take 1 tablet (1 mg total) by mouth 2 (two) times daily.   cyanocobalamin 1000 MCG/ML injection Commonly known as: VITAMIN B12 INJECT 1 ML IN THE MUSCLE EVERY 30 DAYS   cyclobenzaprine 10 MG tablet Commonly known as: FLEXERIL Take 0.5-1 tablets (5-10 mg total) by mouth 3 (three) times daily as needed for muscle spasms.   dexamethasone 4 MG  tablet Commonly known as: DECADRON Take 10 tablets (40 mg) on Day 15 of each cycle. Repeat every 21 days.Take with breakfast.   docusate sodium 100 MG capsule Commonly known as: COLACE Take 1 capsule (100 mg total) by mouth 2 (two) times daily.   escitalopram 10 MG tablet Commonly known as: Lexapro Take 1 tablet (10 mg total) by mouth daily.   fentaNYL 50 MCG/HR Commonly known as: DURAGESIC Place 1 patch onto the skin every 3 (three) days. Start taking on: February 22, 2023   gabapentin 100 MG capsule Commonly known as: NEURONTIN Take 1 capsule (100 mg total) by mouth 3 (three) times daily.   HYDROmorphone 2 MG tablet Commonly known as: DILAUDID Take 0.5-1 tablets (1-2 mg total) by mouth every 6 (six) hours as needed for severe pain.   ipratropium-albuterol 0.5-2.5 (3) MG/3ML Soln Commonly known as: DUONEB Take 3 mLs by nebulization 4 (four) times daily.   levothyroxine 125 MCG tablet Commonly known as: SYNTHROID Take 250 mcg by mouth daily before breakfast.   mometasone-formoterol 200-5 MCG/ACT Aero Commonly known as: DULERA Inhale 2 puffs into the lungs 2 (two) times daily.   ondansetron 8 MG tablet Commonly known as: Zofran Take 1 tablet (8 mg total) by mouth 2 (two) times daily as needed for nausea or vomiting. Start on day 3 after Cytoxan.   pantoprazole 40 MG tablet Commonly known as: PROTONIX 30 min before lunch or dinner   polyethylene glycol 17 g packet Commonly known as: MIRALAX / GLYCOLAX Take 17 g by mouth 2 (two) times daily.   prochlorperazine 10 MG tablet Commonly known as: COMPAZINE Take 1 tablet (10 mg total) by mouth every 6 (six) hours as needed for nausea or vomiting.   sodium bicarbonate 650 MG tablet Take 1 tablet (650 mg total) by mouth 3 (three) times daily.   SYRINGE-NEEDLE (DISP) 3 ML 25G X 1-1/2" 3 ML Misc 1 Device by Does not apply route every 30 (thirty) days. With B12 shot   umeclidinium bromide 62.5 MCG/ACT Aepb Commonly known  as: INCRUSE ELLIPTA Inhale 1 puff into the lungs daily.   Vitamin D (Ergocalciferol) 1.25 MG (50000 UNIT) Caps capsule Commonly known as:  DRISDOL Take 50,000 Units by mouth every 7 (seven) days.   zolpidem 10 MG tablet Commonly known as: AMBIEN Take 0.5-1 tablets (5-10 mg total) by mouth at bedtime as needed for sleep.               Durable Medical Equipment  (From admission, onward)           Start     Ordered   02/20/23 0848  For home use only DME oxygen  Once       Question Answer Comment  Length of Need 6 Months   Mode or (Route) Nasal cannula   Liters per Minute 3   Oxygen delivery system Gas      02/20/23 0847             Follow-up Information     Creig Hines, MD Follow up on 02/21/2023.   Specialty: Oncology Contact information: [address] [place] [address] [phone]                 Allergies  Allergen Reactions   Hctz [Hydrochlorothiazide]     Hyponatremia      Subjective: pt reports SOB and back pain, she is refusing oxygen stating "I don't think I need it, the nebulizers help more"    Discharge Exam: BP 119/68 (BP Location: Right Arm)   Pulse 84   Temp 98 F (36.7 C) (Oral)   Resp 20   Ht 5\' 2"  (1.575 m)   Wt 88.5 kg   SpO2 93%   BMI 35.67 kg/m  General: Pt is alert, awake, not in acute distress Cardiovascular: RRR, S1/S2, no rubs, no gallops Respiratory: CTA, no wheezing, no rhonchi Abdominal: Soft, NT, ND, Extremities: no edema, no cyanosis     The results of significant diagnostics from this hospitalization (including imaging, microbiology, ancillary and laboratory) are listed below for reference.     Microbiology: Recent Results (from the past 240 hour(s))  Surgical PCR screen     Status: None   Collection Time: 02/10/23  7:09 PM   Specimen: Nasal Mucosa; Nasal Swab  Result Value Ref Range Status   MRSA, PCR NEGATIVE NEGATIVE Final   Staphylococcus aureus NEGATIVE NEGATIVE Final     Comment: (NOTE) The Xpert SA Assay (FDA approved for NASAL specimens in patients 58 years of age and older), is one component of a comprehensive surveillance program. It is not intended to diagnose infection nor to guide or monitor treatment. Performed at [facility], [address] [place], [place] [address]   Culture, blood (Routine X 2) w Reflex to ID Panel     Status: None (Preliminary result)   Collection Time: 02/16/23  4:30 PM   Specimen: BLOOD  Result Value Ref Range Status   Specimen Description BLOOD RIGHT ANTECUBITAL  Final   Special Requests   Final    BOTTLES DRAWN AEROBIC AND ANAEROBIC Blood Culture adequate volume   Culture   Final    NO GROWTH 4 DAYS Performed at [facility], [address], [place], [place] [address]    Report Status PENDING  Incomplete  Culture, blood (Routine X 2) w Reflex to ID Panel     Status: None (Preliminary result)   Collection Time: 02/16/23  4:33 PM   Specimen: BLOOD  Result Value Ref Range Status   Specimen Description BLOOD BLOOD LEFT HAND  Final   Special Requests   Final    BOTTLES DRAWN AEROBIC AND ANAEROBIC Blood Culture  adequate volume   Culture   Final    NO GROWTH 4 DAYS Performed at [facility], [address] [place], [place] [address]    Report Status PENDING  Incomplete  SARS Coronavirus 2 by RT PCR (hospital order, performed in [facility] hospital lab) *cepheid single result test* Anterior Nasal Swab     Status: None   Collection Time: 02/16/23  5:45 PM   Specimen: Anterior Nasal Swab  Result Value Ref Range Status   SARS Coronavirus 2 by RT PCR NEGATIVE NEGATIVE Final    Comment: (NOTE) SARS-CoV-2 target nucleic acids are NOT DETECTED.  The SARS-CoV-2 RNA is generally detectable in upper and lower respiratory specimens during the acute phase of infection. The lowest concentration of SARS-CoV-2 viral copies this assay can detect is 250 copies / mL. A negative result does not  preclude SARS-CoV-2 infection and should not be used as the sole basis for treatment or other patient management decisions.  A negative result may occur with improper specimen collection / handling, submission of specimen other than nasopharyngeal swab, presence of viral mutation(s) within the areas targeted by this assay, and inadequate number of viral copies (<250 copies / mL). A negative result must be combined with clinical observations, patient history, and epidemiological information.  Fact Sheet for Patients:   RoadLapTop.co.za  Fact Sheet for Healthcare Providers: http://kim-miller.com/  This test is not yet approved or  cleared by the [place] FDA and has been authorized for detection and/or diagnosis of SARS-CoV-2 by FDA under an Emergency Use Authorization (EUA).  This EUA will remain in effect (meaning this test can be used) for the duration of the COVID-19 declaration under Section 564(b)(1) of the Act, 21 U.S.C. section 360bbb-3(b)(1), unless the authorization is terminated or revoked sooner.  Performed at [facility], [address] [place], [place] [address]      Labs: BNP (last 3 results) Recent Labs    01/31/23 0356 02/01/23 0409 02/17/23 0012  BNP 43.0 71.0 618.9*   Basic Metabolic Panel: Recent Labs  Lab 02/16/23 0442 02/17/23 0340 02/18/23 0533 02/19/23 0541 02/20/23 0421  NA 136 133* 133* 133* 134*  K 4.5 3.8 3.9 3.9 3.7  CL 106 106 104 104 102  CO2 20* 18* 21* 21* 22  GLUCOSE 96 117* 107* 131* 93  BUN 54* 41* 33* 29* 30*  CREATININE 2.77* 2.12* 1.92* 1.83* 1.63*  CALCIUM 8.8* 8.2* 8.6* 8.5* 8.4*  MG 1.5* 1.6* 1.8 1.7 1.7   Liver Function Tests: No results for input(s): "AST", "ALT", "ALKPHOS", "BILITOT", "PROT", "ALBUMIN" in the last 168 hours. No results for input(s): "LIPASE", "AMYLASE" in the last 168 hours. No results for input(s): "AMMONIA" in the last 168  hours. CBC: Recent Labs  Lab 02/16/23 0442 02/17/23 0340 02/18/23 0533 02/19/23 0541 02/19/23 2001 02/20/23 0421  WBC 11.2* 11.5* 8.8 7.8  --  6.8  HGB 8.4* 7.1* 7.4* 7.1* 8.2* 8.2*  HCT 25.8* 21.6* 23.1* 21.3* 24.5* 25.1*  MCV 95.6 94.7 95.5 93.0  --  93.7  PLT 401* 309 305 341  --  354   Cardiac Enzymes: No results for input(s): "CKTOTAL", "CKMB", "CKMBINDEX", "TROPONINI" in the last 168 hours. BNP: Invalid input(s): "POCBNP" CBG: No results for input(s): "GLUCAP" in the last 168 hours. D-Dimer No results for input(s): "DDIMER" in the last 72 hours. Hgb A1c No results for input(s): "HGBA1C" in the last 72 hours. Lipid Profile No results for input(s): "CHOL", "HDL", "LDLCALC", "TRIG", "CHOLHDL", "LDLDIRECT" in  the last 72 hours. Thyroid function studies No results for input(s): "TSH", "T4TOTAL", "T3FREE", "THYROIDAB" in the last 72 hours.  Invalid input(s): "FREET3" Anemia work up No results for input(s): "VITAMINB12", "FOLATE", "FERRITIN", "TIBC", "IRON", "RETICCTPCT" in the last 72 hours. Urinalysis    Component Value Date/Time   COLORURINE STRAW (A) 02/10/2023 1500   APPEARANCEUR CLEAR (A) 02/10/2023 1500   APPEARANCEUR Cloudy (A) 10/27/2020 0831   LABSPEC 1.006 02/10/2023 1500   PHURINE 5.0 02/10/2023 1500   GLUCOSEU NEGATIVE 02/10/2023 1500   GLUCOSEU NEGATIVE 07/25/2017 0823   HGBUR NEGATIVE 02/10/2023 1500   BILIRUBINUR NEGATIVE 02/10/2023 1500   BILIRUBINUR Negative 10/27/2020 0831   KETONESUR NEGATIVE 02/10/2023 1500   PROTEINUR NEGATIVE 02/10/2023 1500   UROBILINOGEN 0.2 06/01/2018 0927   UROBILINOGEN 0.2 07/25/2017 0823   NITRITE NEGATIVE 02/10/2023 1500   LEUKOCYTESUR NEGATIVE 02/10/2023 1500   Sepsis Labs Recent Labs  Lab 02/17/23 0340 02/18/23 0533 02/19/23 0541 02/20/23 0421  WBC 11.5* 8.8 7.8 6.8   Microbiology Recent Results (from the past 240 hour(s))  Surgical PCR screen     Status: None   Collection Time: 02/10/23  7:09 PM    Specimen: Nasal Mucosa; Nasal Swab  Result Value Ref Range Status   MRSA, PCR NEGATIVE NEGATIVE Final   Staphylococcus aureus NEGATIVE NEGATIVE Final    Comment: (NOTE) The Xpert SA Assay (FDA approved for NASAL specimens in patients 42 years of age and older), is one component of a comprehensive surveillance program. It is not intended to diagnose infection nor to guide or monitor treatment. Performed at [facility], [address] [place], [place] [address]   Culture, blood (Routine X 2) w Reflex to ID Panel     Status: None (Preliminary result)   Collection Time: 02/16/23  4:30 PM   Specimen: BLOOD  Result Value Ref Range Status   Specimen Description BLOOD RIGHT ANTECUBITAL  Final   Special Requests   Final    BOTTLES DRAWN AEROBIC AND ANAEROBIC Blood Culture adequate volume   Culture   Final    NO GROWTH 4 DAYS Performed at [facility], [address] [place], [place] [address]    Report Status PENDING  Incomplete  Culture, blood (Routine X 2) w Reflex to ID Panel     Status: None (Preliminary result)   Collection Time: 02/16/23  4:33 PM   Specimen: BLOOD  Result Value Ref Range Status   Specimen Description BLOOD BLOOD LEFT HAND  Final   Special Requests   Final    BOTTLES DRAWN AEROBIC AND ANAEROBIC Blood Culture adequate volume   Culture   Final    NO GROWTH 4 DAYS Performed at [facility], [address], [place], [place] [address]    Report Status PENDING  Incomplete  SARS Coronavirus 2 by RT PCR (hospital order, performed in [facility] hospital lab) *cepheid single result test* Anterior Nasal Swab     Status: None   Collection Time: 02/16/23  5:45 PM   Specimen: Anterior Nasal Swab  Result Value Ref Range Status   SARS Coronavirus 2 by RT PCR NEGATIVE NEGATIVE Final    Comment: (NOTE) SARS-CoV-2 target nucleic acids are NOT DETECTED.  The SARS-CoV-2 RNA is generally detectable in upper and lower respiratory specimens during  the acute phase of infection. The lowest concentration of SARS-CoV-2 viral copies this assay can detect is 250 copies / mL. A negative result does not preclude SARS-CoV-2 infection and should not be used as  the sole basis for treatment or other patient management decisions.  A negative result may occur with improper specimen collection / handling, submission of specimen other than nasopharyngeal swab, presence of viral mutation(s) within the areas targeted by this assay, and inadequate number of viral copies (<250 copies / mL). A negative result must be combined with clinical observations, patient history, and epidemiological information.  Fact Sheet for Patients:   RoadLapTop.co.za  Fact Sheet for Healthcare Providers: http://kim-miller.com/  This test is not yet approved or  cleared by the [place] FDA and has been authorized for detection and/or diagnosis of SARS-CoV-2 by FDA under an Emergency Use Authorization (EUA).  This EUA will remain in effect (meaning this test can be used) for the duration of the COVID-19 declaration under Section 564(b)(1) of the Act, 21 U.S.C. section 360bbb-3(b)(1), unless the authorization is terminated or revoked sooner.  Performed at [facility], [address] [place], [place] [address]    Imaging US RENAL  Result Date: 02/10/2023 CLINICAL DATA:  AKI EXAM: RENAL / URINARY TRACT ULTRASOUND COMPLETE COMPARISON:  None Available. FINDINGS: Right Kidney: Renal measurements: 12.5 cm x 5.6 cm x 4.6 cm = volume: 165 mL. Parenchymal echogenicity is normal. There is no hydronephrosis. Left Kidney: Renal measurements: 12.6 cm x 6.4 cm x 5.0 cm = volume: 213 mL. Parenchymal echogenicity is normal. There is no hydronephrosis. Bladder: Decompressed by a Foley catheter. Other: None. IMPRESSION: Unremarkable renal ultrasound. Electronically Signed   By: Lesia Hausen M.D.   On: 02/10/2023 17:03   DG  Chest 1 View  Result Date: 02/10/2023 CLINICAL DATA:  Acute kidney injury. Sodium 120. Elevated creatinine. EXAM: CHEST  1 VIEW COMPARISON:  02/01/2023 FINDINGS: Shallow inspiration. Cardiac enlargement. Hazy interstitial infiltrates throughout both lungs most likely representing edema although possibly interstitial pneumonia or chronic fibrosis. Small left pleural effusion with basilar atelectasis or consolidation. This could indicate pneumonia in the appropriate clinical setting. No pneumothorax. Postoperative changes in the thoracic spine. No significant change since prior study. IMPRESSION: Cardiac enlargement with probable interstitial pulmonary edema. Small left pleural effusion with basilar atelectasis or consolidation. Similar appearance to previous study. Electronically Signed   By: Burman Nieves M.D.   On: 02/10/2023 15:50      Time coordinating discharge: over 30 minutes  SIGNED:  Sunnie Nielsen DO [facility]

## 2023-02-20 NOTE — TOC Progression Note (Signed)
Transition of Care [facility]) - Progression Note    Patient Details  Name: Paula Garrett MRN: [id number] Date of Birth: 11-Jan-1966  Transition of [facility] CM/SW Contact  Allena Katz, LCSW Phone Number: 02/20/2023, 3:40 PM  Clinical Narrative:   TOC following. MD reports pt may need home oxygen. TOC following for update on if this needs to be ordered.          Expected Discharge Plan and Services         Expected Discharge Date: 02/18/23                                     Social Determinants of Health (SDOH) Interventions SDOH Screenings   Food Insecurity: No Food Insecurity (02/10/2023)  Housing: Low Risk  (02/10/2023)  Transportation Needs: No Transportation Needs (02/10/2023)  Utilities: Not At Risk (02/10/2023)  Alcohol Screen: Low Risk  (01/01/2023)  Depression (PHQ2-9): High Risk (01/01/2023)  Financial Resource Strain: Low Risk  (11/01/2022)  Physical Activity: Insufficiently Active (11/01/2022)  Social Connections: Socially Isolated (11/01/2022)  Stress: Stress Concern Present (11/01/2022)  Tobacco Use: High Risk (02/10/2023)    Readmission Risk Interventions    02/12/2023    3:19 PM  Readmission Risk Prevention Plan  Transportation Screening Complete  Medication Review (RN Care Manager) Complete  HRI or Home Care Consult Complete  SW Recovery Care/Counseling Consult Complete  Palliative Care Screening Complete  Skilled Nursing Facility Not Applicable

## 2023-02-20 NOTE — Progress Notes (Signed)
Patient now states she wants the O2, TOC is gone for the day, will not be able to get O2 arranged until tomorrow. Pt agreeable to wait. DC order cancelled

## 2023-02-21 ENCOUNTER — Encounter: Payer: Self-pay | Admitting: Oncology

## 2023-02-21 ENCOUNTER — Inpatient Hospital Stay: Payer: Managed Care, Other (non HMO)

## 2023-02-21 ENCOUNTER — Other Ambulatory Visit: Payer: Self-pay | Admitting: Oncology

## 2023-02-21 ENCOUNTER — Other Ambulatory Visit (HOSPITAL_COMMUNITY): Payer: Self-pay

## 2023-02-21 ENCOUNTER — Other Ambulatory Visit: Payer: Self-pay | Admitting: *Deleted

## 2023-02-21 ENCOUNTER — Inpatient Hospital Stay: Payer: Managed Care, Other (non HMO) | Admitting: Oncology

## 2023-02-21 DIAGNOSIS — N179 Acute kidney failure, unspecified: Secondary | ICD-10-CM | POA: Diagnosis not present

## 2023-02-21 DIAGNOSIS — C9000 Multiple myeloma not having achieved remission: Secondary | ICD-10-CM

## 2023-02-21 DIAGNOSIS — D509 Iron deficiency anemia, unspecified: Secondary | ICD-10-CM

## 2023-02-21 LAB — BASIC METABOLIC PANEL
Anion gap: 8 (ref 5–15)
BUN: 23 mg/dL — ABNORMAL HIGH (ref 6–20)
CO2: 24 mmol/L (ref 22–32)
Calcium: 8.6 mg/dL — ABNORMAL LOW (ref 8.9–10.3)
Chloride: 105 mmol/L (ref 98–111)
Creatinine, Ser: 1.5 mg/dL — ABNORMAL HIGH (ref 0.44–1.00)
GFR, Estimated: 40 mL/min — ABNORMAL LOW (ref >60)
Glucose, Bld: 112 mg/dL — ABNORMAL HIGH (ref 70–99)
Potassium: 3.5 mmol/L (ref 3.5–5.1)
Sodium: 137 mmol/L (ref 135–145)

## 2023-02-21 LAB — CULTURE, BLOOD (ROUTINE X 2)
Culture: NO GROWTH
Culture: NO GROWTH
Special Requests: ADEQUATE
Special Requests: ADEQUATE

## 2023-02-21 LAB — CBC
HCT: 22.9 % — ABNORMAL LOW (ref 36.0–46.0)
Hemoglobin: 7.7 g/dL — ABNORMAL LOW (ref 12.0–15.0)
MCH: 31.6 pg (ref 26.0–34.0)
MCHC: 33.6 g/dL (ref 30.0–36.0)
MCV: 93.9 fL (ref 80.0–100.0)
Platelets: 252 10*3/uL (ref 150–400)
RBC: 2.44 MIL/uL — ABNORMAL LOW (ref 3.87–5.11)
RDW: 14.8 % (ref 11.5–15.5)
WBC: 3.5 10*3/uL — ABNORMAL LOW (ref 4.0–10.5)
nRBC: 0 % (ref 0.0–0.2)

## 2023-02-21 LAB — MAGNESIUM: Magnesium: 1.4 mg/dL — ABNORMAL LOW (ref 1.7–2.4)

## 2023-02-21 NOTE — TOC Transition Note (Signed)
Transition of Care [facility]) - CM/SW Discharge Note   Patient Details  Name: Paula Garrett MRN: [id number] Date of Birth: 1965-11-17  Transition of Care [facility]) CM/SW Contact:  Liliana Cline, LCSW Phone Number: 02/21/2023, 8:29 AM   Clinical Narrative:    Patient to DC home today. Needs home o2. O2 referral made to [facility] with [employer]. Informed him that patient requests this delivered as soon as possible.   Final next level of care: Home/Self Care Barriers to Discharge: Barriers Resolved   Patient Goals and CMS Choice      Discharge Placement                         Discharge Plan and Services Additional resources added to the After Visit Summary for                  DME Arranged: Oxygen DME Agency: [employer] Date DME Agency Contacted: 02/21/23   Representative spoke with at DME Agency: Marthann Schiller            Social Determinants of Health (SDOH) Interventions SDOH Screenings   Food Insecurity: No Food Insecurity (02/10/2023)  Housing: Low Risk  (02/10/2023)  Transportation Needs: No Transportation Needs (02/10/2023)  Utilities: Not At Risk (02/10/2023)  Alcohol Screen: Low Risk  (01/01/2023)  Depression (PHQ2-9): High Risk (01/01/2023)  Financial Resource Strain: Low Risk  (11/01/2022)  Physical Activity: Insufficiently Active (11/01/2022)  Social Connections: Socially Isolated (11/01/2022)  Stress: Stress Concern Present (11/01/2022)  Tobacco Use: High Risk (02/10/2023)     Readmission Risk Interventions    02/12/2023    3:19 PM  Readmission Risk Prevention Plan  Transportation Screening Complete  Medication Review (RN Care Manager) Complete  HRI or Home Care Consult Complete  SW Recovery Care/Counseling Consult Complete  Palliative Care Screening Complete  Skilled Nursing Facility Not Applicable

## 2023-02-21 NOTE — Discharge Summary (Signed)
Physician Discharge Summary   Patient: Paula Garrett MRN: [id number]  DOB: 06-07-1966   Admit:     Date of Admission: 02/10/2023 Admitted from: home   Discharge: Date of discharge: 02/21/23 Disposition: Home Condition at discharge: fair  CODE STATUS: FULL CODE     Discharge Physician: Sunnie Nielsen, DO [facility]     PCP: Smitty Cords, DO  Recommendations for Outpatient Follow-up:  Follow up with PCP Althea Charon, Netta Neat, DO in 1-2 weeks Please obtain labs/tests: CBC, BMP in 1-2 weeks Follow ASAP w/ cancer center  Please follow up on the following pending results: none  PCP AND OTHER OUTPATIENT PROVIDERS: SEE BELOW FOR SPECIFIC DISCHARGE INSTRUCTIONS PRINTED FOR PATIENT IN ADDITION TO GENERIC AVS PATIENT INFO    Discharge Instructions     Diet - low sodium heart healthy   Complete by: As directed    Increase activity slowly   Complete by: As directed    No wound care   Complete by: As directed    No wound care   Complete by: As directed    Physician communication order   Complete by: As directed    Dexamethasone 40 mg IV to be given in clinic prior to Velcade.   Physician communication order   Complete by: As directed    Dexamethasone 40 mg IV to be given in clinic prior to Velcade.   Physician communication order   Complete by: As directed    Dexamethasone 40 mg IV to be given in clinic prior to Velcade.   Physician communication order   Complete by: As directed    Dexamethasone 40 mg IV to be given in clinic prior to Velcade.   TREATMENT CONDITIONS   Complete by: As directed    Patient should have CBC & CMP within 7 days prior to chemotherapy administration. NOTIFY MD IF: ANC < 1500, Hemoglobin < 8, PLT < 100,000,  Total Bili > 1.5, Creatinine > 1.5, ALT or  AST > 80 or if patient has unstable vital signs: Temperature >= 100.37F, SBP > 180 or < 90, RR > 30 or HR > 100.   TREATMENT CONDITIONS   Complete by: As directed     Patient should have CBC & CMP within 7 days prior to chemotherapy administration. NOTIFY MD IF: ANC < 1500, Hemoglobin < 8, PLT < 100,000,  Total Bili > 1.5, Creatinine > 1.5, ALT or  AST > 80 or if patient has unstable vital signs: Temperature >= 100.37F, SBP > 180 or < 90, RR > 30 or HR > 100.   TREATMENT CONDITIONS   Complete by: As directed    Patient should have CBC & CMP within 7 days prior to chemotherapy administration. NOTIFY MD IF: ANC < 1500, Hemoglobin < 8, PLT < 100,000,  Total Bili > 1.5, Creatinine > 1.5, ALT or  AST > 80 or if patient has unstable vital signs: Temperature >= 100.37F, SBP > 180 or < 90, RR > 30 or HR > 100.         Discharge Diagnoses: Principal Problem:   AKI (acute kidney injury) (HCC) Active Problems:   Multiple myeloma (HCC)   Shortness of breath       Hospital Course: Paula Garrett is a 57 y.o. female with multiple medical problems including hypertension, depression, anxiety, history of tobacco abuse, and multiple myeloma, who was hospitalized 01/24/2023 to 02/06/2023 with chest pain and shortness of breath and was found to have a  large soft tissue mass in the left T6-T7 paraspinal area with destruction of seventh rib and invasion of T6 and T7 vertebral bodies. Patient underwent surgery on 8/13 with postop period complicated by pain. Pathology positive for multiple myeloma.   Patient readmitted 02/10/2023 with AKI, Cr 5.27, assoc we/ hyperkalemia Nephrology consult. Renal US no concerns. AKI improving. Cr down to 1.63 on hospital day 10.   Developed fever/tachycardia 09/01, CXR c/w PNA vs pulm infarct. R/o PE. Started on abx. Needing O2   Started on inpatient chemotherapy. Requiring PRBC transfusion d/t anemia. IV infiltrating and arm swelling, d/c IV, pt is not going to need port for chemo, ok to leave IV out for now.   Pt requesting for discharge yesterday refusing O2, d/c alter held once she decided to stay and wait for O2 to be delivered  today.   Consultants:  Nephrology Hem/Onc Palliative Care   Procedures: none      ASSESSMENT & PLAN:    AKI in the setting of multiple myeloma - improved   baseline creatinine .93,  on admission creatinine of 5.27.   Cr has improved with IVF and MM treatment Continue monitor BMP outpatient  PNA ceftriaxone and azithromycin --> augmentin + azitrho po on discharge    Acute hypoxemic respiratory failure likely due to PNA. ambulation test done see RN note  Will need home O2    Hyponatremia Likely due to renal failure, improving  Monitor BMP   Multiple myeloma HemOnc following, patient seen by Dr. Smith Robert given significant renal issues from multiple myeloma patient has been started on inpatient chemotherapy  Follow further recommendations per Dr. Smith Robert, velcade per schedule start ASA cont allopurinol   Anemia s/p 2u pRBC (irradiate) this admission monitor and transfuse to keep Hgb >7  Recent resection of Mass of spinal cord Neurosurgical procedure done on 8/13 by Dr. Katrinka Blazing - posterior segmental instrumentation T4-T9, posterior lateral arthrodesis from T4-T9, thoracic laminectomy at T6 and T7.   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  pain management as below   Chest pain Chronic back pain cont gabapentin Fentanyl patch 50  oral dilaudid 1-2 mg q6h prn   Asthma COPD cont bronchodilators   Anxiety depression on chronic benzo continue Lexapro and Klonopin   Hypothyroidism continue Synthroid    Hyperkalemia - resolved D/t AKI/ARF Monitor BMP  Mild encephalopathy, ruled out             Discharge Instructions  Allergies as of 02/21/2023       Reactions   Hctz [hydrochlorothiazide]    Hyponatremia         Medication List     STOP taking these medications    oxyCODONE 20 mg 12 hr tablet Commonly known as: OXYCONTIN   Oxycodone HCl 10 MG Tabs   telmisartan 80 MG tablet Commonly known as: Micardis       TAKE these medications     acyclovir 400 MG tablet Commonly known as: ZOVIRAX Take 1 tablet (400 mg total) by mouth 2 (two) times daily.   AeroChamber MV inhaler Use as instructed   albuterol 108 (90 Base) MCG/ACT inhaler Commonly known as: VENTOLIN HFA Inhale 2 puffs into the lungs every 6 (six) hours as needed for wheezing or shortness of breath.   allopurinol 100 MG tablet Commonly known as: ZYLOPRIM Take 1 tablet (100 mg total) by mouth daily.   amLODipine 10 MG tablet Commonly known as: NORVASC Take 1 tablet (10 mg total) by mouth daily.  amoxicillin-clavulanate 875-125 MG tablet Commonly known as: AUGMENTIN Take 1 tablet by mouth every 12 (twelve) hours for 7 days.   aspirin EC 81 MG tablet Take 1 tablet (81 mg total) by mouth daily. Swallow whole.   azithromycin 500 MG tablet Commonly known as: ZITHROMAX Take 1 tablet (500 mg total) by mouth daily.   butalbital-acetaminophen-caffeine 50-325-40 MG tablet Commonly known as: FIORICET Take 1 tablet by mouth every 4 (four) hours as needed for headache.   cetirizine 10 MG tablet Commonly known as: ZYRTEC Take 1 tablet (10 mg total) by mouth daily.   clonazePAM 1 MG tablet Commonly known as: KLONOPIN Take 1 tablet (1 mg total) by mouth 2 (two) times daily.   cyanocobalamin 1000 MCG/ML injection Commonly known as: VITAMIN B12 INJECT 1 ML IN THE MUSCLE EVERY 30 DAYS   cyclobenzaprine 10 MG tablet Commonly known as: FLEXERIL Take 0.5-1 tablets (5-10 mg total) by mouth 3 (three) times daily as needed for muscle spasms.   dexamethasone 4 MG tablet Commonly known as: DECADRON Take 10 tablets (40 mg) on Day 15 of each cycle. Repeat every 21 days.Take with breakfast.   docusate sodium 100 MG capsule Commonly known as: COLACE Take 1 capsule (100 mg total) by mouth 2 (two) times daily.   escitalopram 10 MG tablet Commonly known as: Lexapro Take 1 tablet (10 mg total) by mouth daily.   fentaNYL 50 MCG/HR Commonly known as:  DURAGESIC Place 1 patch onto the skin every 3 (three) days. Start taking on: February 22, 2023   gabapentin 100 MG capsule Commonly known as: NEURONTIN Take 1 capsule (100 mg total) by mouth 3 (three) times daily.   HYDROmorphone 2 MG tablet Commonly known as: DILAUDID Take 0.5-1 tablets (1-2 mg total) by mouth every 6 (six) hours as needed for severe pain.   ipratropium-albuterol 0.5-2.5 (3) MG/3ML Soln Commonly known as: DUONEB Take 3 mLs by nebulization 4 (four) times daily.   levothyroxine 125 MCG tablet Commonly known as: SYNTHROID Take 250 mcg by mouth daily before breakfast.   mometasone-formoterol 200-5 MCG/ACT Aero Commonly known as: DULERA Inhale 2 puffs into the lungs 2 (two) times daily.   ondansetron 8 MG tablet Commonly known as: Zofran Take 1 tablet (8 mg total) by mouth 2 (two) times daily as needed for nausea or vomiting. Start on day 3 after Cytoxan.   pantoprazole 40 MG tablet Commonly known as: PROTONIX 30 min before lunch or dinner   polyethylene glycol 17 g packet Commonly known as: MIRALAX / GLYCOLAX Take 17 g by mouth 2 (two) times daily.   prochlorperazine 10 MG tablet Commonly known as: COMPAZINE Take 1 tablet (10 mg total) by mouth every 6 (six) hours as needed for nausea or vomiting.   sodium bicarbonate 650 MG tablet Take 1 tablet (650 mg total) by mouth 3 (three) times daily.   SYRINGE-NEEDLE (DISP) 3 ML 25G X 1-1/2" 3 ML Misc 1 Device by Does not apply route every 30 (thirty) days. With B12 shot   umeclidinium bromide 62.5 MCG/ACT Aepb Commonly known as: INCRUSE ELLIPTA Inhale 1 puff into the lungs daily.   Vitamin D (Ergocalciferol) 1.25 MG (50000 UNIT) Caps capsule Commonly known as: DRISDOL Take 50,000 Units by mouth every 7 (seven) days.   zolpidem 10 MG tablet Commonly known as: AMBIEN Take 0.5-1 tablets (5-10 mg total) by mouth at bedtime as needed for sleep.               Durable Medical Equipment  (  From  admission, onward)           Start     Ordered   02/21/23 0828  For home use only DME oxygen  Once       Question Answer Comment  Length of Need Lifetime   Mode or (Route) Nasal cannula   Liters per Minute 2   Frequency Continuous (stationary and portable oxygen unit needed)   Oxygen delivery system Gas      02/21/23 0827   02/20/23 0848  For home use only DME oxygen  Once       Question Answer Comment  Length of Need 6 Months   Mode or (Route) Nasal cannula   Liters per Minute 3   Oxygen delivery system Gas      02/20/23 0847             Follow-up Information     Creig Hines, MD Follow up on 02/21/2023.   Specialty: Oncology Contact information: [address] [place] [address] [phone]                 Allergies  Allergen Reactions   Hctz [Hydrochlorothiazide]     Hyponatremia      Subjective: pt reports feeling fine this morning, eager for discharge home, she does not want to come back this afternoon for chemo states will follow another time    Discharge Exam: BP 138/74 (BP Location: Right Arm)   Pulse 81   Temp 98 F (36.7 C) (Oral)   Resp 17   Ht 5\' 2"  (1.575 m)   Wt 88.5 kg   SpO2 98%   BMI 35.67 kg/m  General: Pt is alert, awake, not in acute distress Cardiovascular: RRR, S1/S2, no rubs, no gallops Respiratory: CTA, no wheezing, no rhonchi Abdominal: Soft, NT, ND, Extremities: no edema, no cyanosis     The results of significant diagnostics from this hospitalization (including imaging, microbiology, ancillary and laboratory) are listed below for reference.     Microbiology: Recent Results (from the past 240 hour(s))  Culture, blood (Routine X 2) w Reflex to ID Panel     Status: None   Collection Time: 02/16/23  4:30 PM   Specimen: BLOOD  Result Value Ref Range Status   Specimen Description BLOOD RIGHT ANTECUBITAL  Final   Special Requests   Final    BOTTLES DRAWN AEROBIC AND ANAEROBIC Blood Culture adequate  volume   Culture   Final    NO GROWTH 5 DAYS Performed at [facility], [address] [place], [place] [address]    Report Status 02/21/2023 FINAL  Final  Culture, blood (Routine X 2) w Reflex to ID Panel     Status: None   Collection Time: 02/16/23  4:33 PM   Specimen: BLOOD  Result Value Ref Range Status   Specimen Description BLOOD BLOOD LEFT HAND  Final   Special Requests   Final    BOTTLES DRAWN AEROBIC AND ANAEROBIC Blood Culture adequate volume   Culture   Final    NO GROWTH 5 DAYS Performed at [facility], [address] [place], [place] [address]    Report Status 02/21/2023 FINAL  Final  SARS Coronavirus 2 by RT PCR (hospital order, performed in [facility] hospital lab) *cepheid single result test* Anterior Nasal Swab     Status: None   Collection Time: 02/16/23  5:45 PM   Specimen: Anterior Nasal Swab  Result Value Ref Range Status   SARS  Coronavirus 2 by RT PCR NEGATIVE NEGATIVE Final    Comment: (NOTE) SARS-CoV-2 target nucleic acids are NOT DETECTED.  The SARS-CoV-2 RNA is generally detectable in upper and lower respiratory specimens during the acute phase of infection. The lowest concentration of SARS-CoV-2 viral copies this assay can detect is 250 copies / mL. A negative result does not preclude SARS-CoV-2 infection and should not be used as the sole basis for treatment or other patient management decisions.  A negative result may occur with improper specimen collection / handling, submission of specimen other than nasopharyngeal swab, presence of viral mutation(s) within the areas targeted by this assay, and inadequate number of viral copies (<250 copies / mL). A negative result must be combined with clinical observations, patient history, and epidemiological information.  Fact Sheet for Patients:   RoadLapTop.co.za  Fact Sheet for Healthcare Providers: http://kim-miller.com/  This test is  not yet approved or  cleared by the [place] FDA and has been authorized for detection and/or diagnosis of SARS-CoV-2 by FDA under an Emergency Use Authorization (EUA).  This EUA will remain in effect (meaning this test can be used) for the duration of the COVID-19 declaration under Section 564(b)(1) of the Act, 21 U.S.C. section 360bbb-3(b)(1), unless the authorization is terminated or revoked sooner.  Performed at [facility], [address] [place], [place] [address]      Labs: BNP (last 3 results) Recent Labs    01/31/23 0356 02/01/23 0409 02/17/23 0012  BNP 43.0 71.0 618.9*   Basic Metabolic Panel: Recent Labs  Lab 02/17/23 0340 02/18/23 0533 02/19/23 0541 02/20/23 0421 02/21/23 0836  NA 133* 133* 133* 134* 137  K 3.8 3.9 3.9 3.7 3.5  CL 106 104 104 102 105  CO2 18* 21* 21* 22 24  GLUCOSE 117* 107* 131* 93 112*  BUN 41* 33* 29* 30* 23*  CREATININE 2.12* 1.92* 1.83* 1.63* 1.50*  CALCIUM 8.2* 8.6* 8.5* 8.4* 8.6*  MG 1.6* 1.8 1.7 1.7 1.4*   Liver Function Tests: No results for input(s): "AST", "ALT", "ALKPHOS", "BILITOT", "PROT", "ALBUMIN" in the last 168 hours. No results for input(s): "LIPASE", "AMYLASE" in the last 168 hours. No results for input(s): "AMMONIA" in the last 168 hours. CBC: Recent Labs  Lab 02/17/23 0340 02/18/23 0533 02/19/23 0541 02/19/23 2001 02/20/23 0421 02/21/23 0836  WBC 11.5* 8.8 7.8  --  6.8 3.5*  HGB 7.1* 7.4* 7.1* 8.2* 8.2* 7.7*  HCT 21.6* 23.1* 21.3* 24.5* 25.1* 22.9*  MCV 94.7 95.5 93.0  --  93.7 93.9  PLT 309 305 341  --  354 252   Cardiac Enzymes: No results for input(s): "CKTOTAL", "CKMB", "CKMBINDEX", "TROPONINI" in the last 168 hours. BNP: Invalid input(s): "POCBNP" CBG: No results for input(s): "GLUCAP" in the last 168 hours. D-Dimer No results for input(s): "DDIMER" in the last 72 hours. Hgb A1c No results for input(s): "HGBA1C" in the last 72 hours. Lipid Profile No results for input(s):  "CHOL", "HDL", "LDLCALC", "TRIG", "CHOLHDL", "LDLDIRECT" in the last 72 hours. Thyroid function studies No results for input(s): "TSH", "T4TOTAL", "T3FREE", "THYROIDAB" in the last 72 hours.  Invalid input(s): "FREET3" Anemia work up No results for input(s): "VITAMINB12", "FOLATE", "FERRITIN", "TIBC", "IRON", "RETICCTPCT" in the last 72 hours. Urinalysis    Component Value Date/Time   COLORURINE STRAW (A) 02/10/2023 1500   APPEARANCEUR CLEAR (A) 02/10/2023 1500   APPEARANCEUR Cloudy (A) 10/27/2020 0831   LABSPEC 1.006 02/10/2023 1500   PHURINE 5.0 02/10/2023 1500   GLUCOSEU NEGATIVE  02/10/2023 1500   GLUCOSEU NEGATIVE 07/25/2017 0823   HGBUR NEGATIVE 02/10/2023 1500   BILIRUBINUR NEGATIVE 02/10/2023 1500   BILIRUBINUR Negative 10/27/2020 0831   KETONESUR NEGATIVE 02/10/2023 1500   PROTEINUR NEGATIVE 02/10/2023 1500   UROBILINOGEN 0.2 06/01/2018 0927   UROBILINOGEN 0.2 07/25/2017 0823   NITRITE NEGATIVE 02/10/2023 1500   LEUKOCYTESUR NEGATIVE 02/10/2023 1500   Sepsis Labs Recent Labs  Lab 02/18/23 0533 02/19/23 0541 02/20/23 0421 02/21/23 0836  WBC 8.8 7.8 6.8 3.5*   Microbiology Recent Results (from the past 240 hour(s))  Culture, blood (Routine X 2) w Reflex to ID Panel     Status: None   Collection Time: 02/16/23  4:30 PM   Specimen: BLOOD  Result Value Ref Range Status   Specimen Description BLOOD RIGHT ANTECUBITAL  Final   Special Requests   Final    BOTTLES DRAWN AEROBIC AND ANAEROBIC Blood Culture adequate volume   Culture   Final    NO GROWTH 5 DAYS Performed at [facility], [address] [place], [place] [address]    Report Status 02/21/2023 FINAL  Final  Culture, blood (Routine X 2) w Reflex to ID Panel     Status: None   Collection Time: 02/16/23  4:33 PM   Specimen: BLOOD  Result Value Ref Range Status   Specimen Description BLOOD BLOOD LEFT HAND  Final   Special Requests   Final    BOTTLES DRAWN AEROBIC AND ANAEROBIC Blood Culture  adequate volume   Culture   Final    NO GROWTH 5 DAYS Performed at [facility], [address], [place], [place] [address]    Report Status 02/21/2023 FINAL  Final  SARS Coronavirus 2 by RT PCR (hospital order, performed in [facility] hospital lab) *cepheid single result test* Anterior Nasal Swab     Status: None   Collection Time: 02/16/23  5:45 PM   Specimen: Anterior Nasal Swab  Result Value Ref Range Status   SARS Coronavirus 2 by RT PCR NEGATIVE NEGATIVE Final    Comment: (NOTE) SARS-CoV-2 target nucleic acids are NOT DETECTED.  The SARS-CoV-2 RNA is generally detectable in upper and lower respiratory specimens during the acute phase of infection. The lowest concentration of SARS-CoV-2 viral copies this assay can detect is 250 copies / mL. A negative result does not preclude SARS-CoV-2 infection and should not be used as the sole basis for treatment or other patient management decisions.  A negative result may occur with improper specimen collection / handling, submission of specimen other than nasopharyngeal swab, presence of viral mutation(s) within the areas targeted by this assay, and inadequate number of viral copies (<250 copies / mL). A negative result must be combined with clinical observations, patient history, and epidemiological information.  Fact Sheet for Patients:   RoadLapTop.co.za  Fact Sheet for Healthcare Providers: http://kim-miller.com/  This test is not yet approved or  cleared by the [place] FDA and has been authorized for detection and/or diagnosis of SARS-CoV-2 by FDA under an Emergency Use Authorization (EUA).  This EUA will remain in effect (meaning this test can be used) for the duration of the COVID-19 declaration under Section 564(b)(1) of the Act, 21 U.S.C. section 360bbb-3(b)(1), unless the authorization is terminated or revoked sooner.  Performed at [facility], [address] [place], [place] [address]    Imaging US RENAL  Result Date: 02/10/2023 CLINICAL DATA:  AKI EXAM: RENAL / URINARY TRACT ULTRASOUND COMPLETE COMPARISON:  None Available. FINDINGS:  Right Kidney: Renal measurements: 12.5 cm x 5.6 cm x 4.6 cm = volume: 165 mL. Parenchymal echogenicity is normal. There is no hydronephrosis. Left Kidney: Renal measurements: 12.6 cm x 6.4 cm x 5.0 cm = volume: 213 mL. Parenchymal echogenicity is normal. There is no hydronephrosis. Bladder: Decompressed by a Foley catheter. Other: None. IMPRESSION: Unremarkable renal ultrasound. Electronically Signed   By: Lesia Hausen M.D.   On: 02/10/2023 17:03   DG Chest 1 View  Result Date: 02/10/2023 CLINICAL DATA:  Acute kidney injury. Sodium 120. Elevated creatinine. EXAM: CHEST  1 VIEW COMPARISON:  02/01/2023 FINDINGS: Shallow inspiration. Cardiac enlargement. Hazy interstitial infiltrates throughout both lungs most likely representing edema although possibly interstitial pneumonia or chronic fibrosis. Small left pleural effusion with basilar atelectasis or consolidation. This could indicate pneumonia in the appropriate clinical setting. No pneumothorax. Postoperative changes in the thoracic spine. No significant change since prior study. IMPRESSION: Cardiac enlargement with probable interstitial pulmonary edema. Small left pleural effusion with basilar atelectasis or consolidation. Similar appearance to previous study. Electronically Signed   By: Burman Nieves M.D.   On: 02/10/2023 15:50      Time coordinating discharge: less than 30 minutes  SIGNED:  Sunnie Nielsen DO [facility]

## 2023-02-21 NOTE — Plan of Care (Signed)
  Problem: Pain Managment: Goal: General experience of comfort will improve Outcome: Progressing   

## 2023-02-23 ENCOUNTER — Inpatient Hospital Stay
Admission: EM | Admit: 2023-02-23 | Discharge: 2023-02-27 | DRG: 871 | Disposition: A | Discharge status: Home / Self Care | Payer: Managed Care, Other (non HMO) | Attending: Internal Medicine | Admitting: Internal Medicine

## 2023-02-23 ENCOUNTER — Other Ambulatory Visit: Payer: Self-pay

## 2023-02-23 ENCOUNTER — Emergency Department: Payer: Managed Care, Other (non HMO)

## 2023-02-23 ENCOUNTER — Inpatient Hospital Stay: Payer: Managed Care, Other (non HMO)

## 2023-02-23 DIAGNOSIS — J440 Chronic obstructive pulmonary disease with acute lower respiratory infection: Secondary | ICD-10-CM | POA: Diagnosis present

## 2023-02-23 DIAGNOSIS — Z6838 Body mass index (BMI) 38.0-38.9, adult: Secondary | ICD-10-CM

## 2023-02-23 DIAGNOSIS — F17210 Nicotine dependence, cigarettes, uncomplicated: Secondary | ICD-10-CM | POA: Diagnosis present

## 2023-02-23 DIAGNOSIS — Z981 Arthrodesis status: Secondary | ICD-10-CM

## 2023-02-23 DIAGNOSIS — Z9981 Dependence on supplemental oxygen: Secondary | ICD-10-CM

## 2023-02-23 DIAGNOSIS — D630 Anemia in neoplastic disease: Secondary | ICD-10-CM | POA: Diagnosis present

## 2023-02-23 DIAGNOSIS — J90 Pleural effusion, not elsewhere classified: Secondary | ICD-10-CM | POA: Diagnosis present

## 2023-02-23 DIAGNOSIS — J189 Pneumonia, unspecified organism: Secondary | ICD-10-CM | POA: Diagnosis present

## 2023-02-23 DIAGNOSIS — C9000 Multiple myeloma not having achieved remission: Secondary | ICD-10-CM | POA: Diagnosis present

## 2023-02-23 DIAGNOSIS — Z888 Allergy status to other drugs, medicaments and biological substances status: Secondary | ICD-10-CM

## 2023-02-23 DIAGNOSIS — D701 Agranulocytosis secondary to cancer chemotherapy: Secondary | ICD-10-CM | POA: Diagnosis present

## 2023-02-23 DIAGNOSIS — E039 Hypothyroidism, unspecified: Secondary | ICD-10-CM | POA: Diagnosis present

## 2023-02-23 DIAGNOSIS — G4733 Obstructive sleep apnea (adult) (pediatric): Secondary | ICD-10-CM | POA: Diagnosis present

## 2023-02-23 DIAGNOSIS — D72819 Decreased white blood cell count, unspecified: Secondary | ICD-10-CM | POA: Diagnosis present

## 2023-02-23 DIAGNOSIS — Z8269 Family history of other diseases of the musculoskeletal system and connective tissue: Secondary | ICD-10-CM

## 2023-02-23 DIAGNOSIS — R0602 Shortness of breath: Secondary | ICD-10-CM

## 2023-02-23 DIAGNOSIS — F419 Anxiety disorder, unspecified: Secondary | ICD-10-CM | POA: Diagnosis present

## 2023-02-23 DIAGNOSIS — Z818 Family history of other mental and behavioral disorders: Secondary | ICD-10-CM

## 2023-02-23 DIAGNOSIS — N179 Acute kidney failure, unspecified: Secondary | ICD-10-CM | POA: Diagnosis present

## 2023-02-23 DIAGNOSIS — E890 Postprocedural hypothyroidism: Secondary | ICD-10-CM | POA: Diagnosis present

## 2023-02-23 DIAGNOSIS — T451X5A Adverse effect of antineoplastic and immunosuppressive drugs, initial encounter: Secondary | ICD-10-CM | POA: Diagnosis present

## 2023-02-23 DIAGNOSIS — J441 Chronic obstructive pulmonary disease with (acute) exacerbation: Secondary | ICD-10-CM | POA: Diagnosis present

## 2023-02-23 DIAGNOSIS — F32A Depression, unspecified: Secondary | ICD-10-CM | POA: Diagnosis present

## 2023-02-23 DIAGNOSIS — I10 Essential (primary) hypertension: Secondary | ICD-10-CM | POA: Diagnosis present

## 2023-02-23 DIAGNOSIS — K219 Gastro-esophageal reflux disease without esophagitis: Secondary | ICD-10-CM | POA: Diagnosis present

## 2023-02-23 DIAGNOSIS — R739 Hyperglycemia, unspecified: Secondary | ICD-10-CM | POA: Diagnosis present

## 2023-02-23 DIAGNOSIS — F17200 Nicotine dependence, unspecified, uncomplicated: Secondary | ICD-10-CM | POA: Diagnosis present

## 2023-02-23 DIAGNOSIS — Z1152 Encounter for screening for COVID-19: Secondary | ICD-10-CM | POA: Diagnosis not present

## 2023-02-23 DIAGNOSIS — Z833 Family history of diabetes mellitus: Secondary | ICD-10-CM

## 2023-02-23 DIAGNOSIS — E669 Obesity, unspecified: Secondary | ICD-10-CM | POA: Diagnosis present

## 2023-02-23 DIAGNOSIS — G9341 Metabolic encephalopathy: Secondary | ICD-10-CM | POA: Diagnosis present

## 2023-02-23 DIAGNOSIS — Z7982 Long term (current) use of aspirin: Secondary | ICD-10-CM

## 2023-02-23 DIAGNOSIS — Z515 Encounter for palliative care: Secondary | ICD-10-CM | POA: Diagnosis not present

## 2023-02-23 DIAGNOSIS — Z813 Family history of other psychoactive substance abuse and dependence: Secondary | ICD-10-CM

## 2023-02-23 DIAGNOSIS — G894 Chronic pain syndrome: Secondary | ICD-10-CM | POA: Diagnosis present

## 2023-02-23 DIAGNOSIS — A419 Sepsis, unspecified organism: Principal | ICD-10-CM | POA: Diagnosis present

## 2023-02-23 DIAGNOSIS — Z79899 Other long term (current) drug therapy: Secondary | ICD-10-CM

## 2023-02-23 DIAGNOSIS — G4700 Insomnia, unspecified: Secondary | ICD-10-CM | POA: Diagnosis present

## 2023-02-23 DIAGNOSIS — Z809 Family history of malignant neoplasm, unspecified: Secondary | ICD-10-CM

## 2023-02-23 DIAGNOSIS — Z79890 Hormone replacement therapy (postmenopausal): Secondary | ICD-10-CM

## 2023-02-23 DIAGNOSIS — Z800 Family history of malignant neoplasm of digestive organs: Secondary | ICD-10-CM

## 2023-02-23 DIAGNOSIS — J9621 Acute and chronic respiratory failure with hypoxia: Secondary | ICD-10-CM

## 2023-02-23 DIAGNOSIS — D6489 Other specified anemias: Secondary | ICD-10-CM | POA: Diagnosis not present

## 2023-02-23 DIAGNOSIS — Z9884 Bariatric surgery status: Secondary | ICD-10-CM

## 2023-02-23 DIAGNOSIS — Z811 Family history of alcohol abuse and dependence: Secondary | ICD-10-CM

## 2023-02-23 DIAGNOSIS — Z8261 Family history of arthritis: Secondary | ICD-10-CM

## 2023-02-23 DIAGNOSIS — J9622 Acute and chronic respiratory failure with hypercapnia: Secondary | ICD-10-CM | POA: Diagnosis present

## 2023-02-23 DIAGNOSIS — Z825 Family history of asthma and other chronic lower respiratory diseases: Secondary | ICD-10-CM

## 2023-02-23 DIAGNOSIS — R000 Tachycardia, unspecified: Secondary | ICD-10-CM

## 2023-02-23 DIAGNOSIS — Z8249 Family history of ischemic heart disease and other diseases of the circulatory system: Secondary | ICD-10-CM

## 2023-02-23 DIAGNOSIS — Z7951 Long term (current) use of inhaled steroids: Secondary | ICD-10-CM

## 2023-02-23 DIAGNOSIS — J9601 Acute respiratory failure with hypoxia: Principal | ICD-10-CM

## 2023-02-23 DIAGNOSIS — F411 Generalized anxiety disorder: Secondary | ICD-10-CM | POA: Diagnosis present

## 2023-02-23 LAB — CBC WITH DIFFERENTIAL/PLATELET
Abs Immature Granulocytes: 0.08 10*3/uL — ABNORMAL HIGH (ref 0.00–0.07)
Basophils Absolute: 0 10*3/uL (ref 0.0–0.1)
Basophils Relative: 1 %
Eosinophils Absolute: 0 10*3/uL (ref 0.0–0.5)
Eosinophils Relative: 0 %
HCT: 26 % — ABNORMAL LOW (ref 36.0–46.0)
Hemoglobin: 8.5 g/dL — ABNORMAL LOW (ref 12.0–15.0)
Immature Granulocytes: 2 %
Lymphocytes Relative: 9 %
Lymphs Abs: 0.4 10*3/uL — ABNORMAL LOW (ref 0.7–4.0)
MCH: 31.3 pg (ref 26.0–34.0)
MCHC: 32.7 g/dL (ref 30.0–36.0)
MCV: 95.6 fL (ref 80.0–100.0)
Monocytes Absolute: 0.1 10*3/uL (ref 0.1–1.0)
Monocytes Relative: 2 %
Neutro Abs: 4.3 10*3/uL (ref 1.7–7.7)
Neutrophils Relative %: 86 %
Platelets: 235 10*3/uL (ref 150–400)
RBC: 2.72 MIL/uL — ABNORMAL LOW (ref 3.87–5.11)
RDW: 14.4 % (ref 11.5–15.5)
WBC: 5 10*3/uL (ref 4.0–10.5)
nRBC: 0 % (ref 0.0–0.2)

## 2023-02-23 LAB — URINE DRUG SCREEN, QUALITATIVE (ARMC ONLY)
Amphetamines, Ur Screen: NOT DETECTED
Barbiturates, Ur Screen: POSITIVE — AB
Benzodiazepine, Ur Scrn: POSITIVE — AB
Cannabinoid 50 Ng, Ur ~~LOC~~: NOT DETECTED
Cocaine Metabolite,Ur ~~LOC~~: NOT DETECTED
MDMA (Ecstasy)Ur Screen: NOT DETECTED
Methadone Scn, Ur: NOT DETECTED
Opiate, Ur Screen: POSITIVE — AB
Phencyclidine (PCP) Ur S: NOT DETECTED
Tricyclic, Ur Screen: POSITIVE — AB

## 2023-02-23 LAB — URINALYSIS, COMPLETE (UACMP) WITH MICROSCOPIC
Bacteria, UA: NONE SEEN
Bilirubin Urine: NEGATIVE
Glucose, UA: NEGATIVE mg/dL
Hgb urine dipstick: NEGATIVE
Ketones, ur: NEGATIVE mg/dL
Leukocytes,Ua: NEGATIVE
Nitrite: NEGATIVE
Protein, ur: NEGATIVE mg/dL
RBC / HPF: 0 RBC/hpf (ref 0–5)
Specific Gravity, Urine: 1.012 (ref 1.005–1.030)
pH: 5 (ref 5.0–8.0)

## 2023-02-23 LAB — COMPREHENSIVE METABOLIC PANEL
ALT: 9 U/L (ref 0–44)
AST: 13 U/L — ABNORMAL LOW (ref 15–41)
Albumin: 2.2 g/dL — ABNORMAL LOW (ref 3.5–5.0)
Alkaline Phosphatase: 71 U/L (ref 38–126)
Anion gap: 9 (ref 5–15)
BUN: 20 mg/dL (ref 6–20)
CO2: 25 mmol/L (ref 22–32)
Calcium: 9 mg/dL (ref 8.9–10.3)
Chloride: 101 mmol/L (ref 98–111)
Creatinine, Ser: 1.42 mg/dL — ABNORMAL HIGH (ref 0.44–1.00)
GFR, Estimated: 43 mL/min — ABNORMAL LOW (ref >60)
Glucose, Bld: 162 mg/dL — ABNORMAL HIGH (ref 70–99)
Potassium: 4 mmol/L (ref 3.5–5.1)
Sodium: 135 mmol/L (ref 135–145)
Total Bilirubin: 0.4 mg/dL (ref 0.3–1.2)
Total Protein: 8.1 g/dL (ref 6.5–8.1)

## 2023-02-23 LAB — BLOOD GAS, VENOUS
Acid-base deficit: 2.5 mmol/L — ABNORMAL HIGH (ref 0.0–2.0)
Bicarbonate: 26.6 mmol/L (ref 20.0–28.0)
O2 Saturation: 61.1 %
Patient temperature: 37
pCO2, Ven: 65 mmHg — ABNORMAL HIGH (ref 44–60)
pH, Ven: 7.22 — ABNORMAL LOW (ref 7.25–7.43)
pO2, Ven: 35 mmHg (ref 32–45)

## 2023-02-23 LAB — TROPONIN I (HIGH SENSITIVITY)
Troponin I (High Sensitivity): 11 ng/L (ref <18)
Troponin I (High Sensitivity): 11 ng/L (ref <18)

## 2023-02-23 LAB — CBG MONITORING, ED: Glucose-Capillary: 144 mg/dL — ABNORMAL HIGH (ref 70–99)

## 2023-02-23 LAB — LACTIC ACID, PLASMA
Lactic Acid, Venous: 0.5 mmol/L (ref 0.5–1.9)
Lactic Acid, Venous: 0.7 mmol/L (ref 0.5–1.9)

## 2023-02-23 LAB — RESP PANEL BY RT-PCR (RSV, FLU A&B, COVID)  RVPGX2
Influenza A by PCR: NEGATIVE
Influenza B by PCR: NEGATIVE
Resp Syncytial Virus by PCR: NEGATIVE
SARS Coronavirus 2 by RT PCR: NEGATIVE

## 2023-02-23 LAB — BRAIN NATRIURETIC PEPTIDE: B Natriuretic Peptide: 347.2 pg/mL — ABNORMAL HIGH (ref 0.0–100.0)

## 2023-02-23 MED ORDER — LORAZEPAM 2 MG/ML IJ SOLN
0.5000 mg | Freq: Once | INTRAMUSCULAR | Status: AC
  Administered 2023-02-23: 0.5 mg via INTRAVENOUS
  Filled 2023-02-23: qty 1

## 2023-02-23 MED ORDER — BUDESONIDE 0.25 MG/2ML IN SUSP
0.2500 mg | Freq: Two times a day (BID) | RESPIRATORY_TRACT | Status: DC
  Administered 2023-02-24 – 2023-02-27 (×7): 0.25 mg via RESPIRATORY_TRACT
  Filled 2023-02-23 (×7): qty 2

## 2023-02-23 MED ORDER — KETOROLAC TROMETHAMINE 30 MG/ML IJ SOLN
15.0000 mg | Freq: Once | INTRAMUSCULAR | Status: AC
  Administered 2023-02-23: 15 mg via INTRAVENOUS
  Filled 2023-02-23: qty 1

## 2023-02-23 MED ORDER — ENOXAPARIN SODIUM 40 MG/0.4ML IJ SOSY: 40.0000 mg | PREFILLED_SYRINGE | INTRAMUSCULAR | Status: DC

## 2023-02-23 MED ORDER — LACTATED RINGERS IV SOLN: INTRAVENOUS | Status: DC

## 2023-02-23 MED ORDER — IPRATROPIUM-ALBUTEROL 0.5-2.5 (3) MG/3ML IN SOLN
3.0000 mL | Freq: Four times a day (QID) | RESPIRATORY_TRACT | Status: DC
  Administered 2023-02-24 – 2023-02-27 (×14): 3 mL via RESPIRATORY_TRACT
  Filled 2023-02-23 (×13): qty 3

## 2023-02-23 MED ORDER — IPRATROPIUM-ALBUTEROL 0.5-2.5 (3) MG/3ML IN SOLN: 3.0000 mL | Freq: Four times a day (QID) | RESPIRATORY_TRACT | Status: DC | PRN

## 2023-02-23 MED ORDER — SODIUM CHLORIDE 0.9 % IV SOLN
500.0000 mg | INTRAVENOUS | Status: DC
  Administered 2023-02-23: 500 mg via INTRAVENOUS
  Filled 2023-02-23: qty 5

## 2023-02-23 MED ORDER — POLYETHYLENE GLYCOL 3350 17 G PO PACK: 17.0000 g | PACK | Freq: Every day | ORAL | Status: DC | PRN

## 2023-02-23 MED ORDER — IPRATROPIUM-ALBUTEROL 0.5-2.5 (3) MG/3ML IN SOLN
3.0000 mL | Freq: Once | RESPIRATORY_TRACT | Status: AC
  Administered 2023-02-23: 3 mL via RESPIRATORY_TRACT
  Filled 2023-02-23: qty 3

## 2023-02-23 MED ORDER — IOHEXOL 350 MG/ML SOLN
75.0000 mL | Freq: Once | INTRAVENOUS | Status: AC | PRN
  Administered 2023-02-23: 75 mL via INTRAVENOUS

## 2023-02-23 MED ORDER — SODIUM CHLORIDE 0.9 % IV SOLN
2.0000 g | INTRAVENOUS | Status: DC
  Administered 2023-02-23: 2 g via INTRAVENOUS
  Filled 2023-02-23: qty 20

## 2023-02-23 MED ORDER — ENOXAPARIN SODIUM 60 MG/0.6ML IJ SOSY
0.5000 mg/kg | PREFILLED_SYRINGE | INTRAMUSCULAR | Status: DC
  Administered 2023-02-23 – 2023-02-26 (×4): 50 mg via SUBCUTANEOUS
  Filled 2023-02-23 (×4): qty 0.6

## 2023-02-23 MED ORDER — FUROSEMIDE 10 MG/ML IJ SOLN
40.0000 mg | Freq: Once | INTRAMUSCULAR | Status: AC
  Administered 2023-02-23: 40 mg via INTRAVENOUS
  Filled 2023-02-23: qty 4

## 2023-02-23 MED ORDER — DOCUSATE SODIUM 100 MG PO CAPS: 100.0000 mg | ORAL_CAPSULE | Freq: Two times a day (BID) | ORAL | Status: DC | PRN

## 2023-02-23 MED ORDER — MAGNESIUM SULFATE 4 GM/100ML IV SOLN
4.0000 g | Freq: Once | INTRAVENOUS | Status: AC
  Administered 2023-02-24: 4 g via INTRAVENOUS
  Filled 2023-02-23 (×2): qty 100

## 2023-02-23 NOTE — H&P (Incomplete)
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  Per ED reports, EMS was called out for acute respiratory distress. On arrival, patient was found with increased work of breathing and sats in the 70's. She was treated with Duonebs, and 125 mg solumedrol. She was placed on high flow via facemask and transported to the ED.   ED Course: Initial vital signs showed HR of 94 beats/minute, BP 156/74mm Hg, the RR 29 breaths/minute, and the oxygen saturation 99% via facemask and a temperature of 96.13F (36.1C).  Pertinent Labs/Diagnostics Findings: Glucose: 162 BUN/Cr.:20/1.42 WBC: 5.0 Hgb/Hct:8.5/26.0  PCT:0.15 COVID PCR: Negative BNP:347.2   VBG: pO2 35; pCO2 65; pH 7.22;  HCO3 26.6, %O2 Sat 61.1.  CXR> CTH> CTA Chest> see below Medication administered in the ED: Ceftriaxone, azithromycin mycin, DuoNebs, lorazepam 0.5 mg IV, Toradol 50 mg IV and magnesium sulfate.  Due to increased somnolent on BiPAP and high risk for intubation PCCM consulted for admission.  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  9/8: CTA Chest >IMPRESSION: 1. Poor opacification of the pulmonary arteries due to timing of contrast. No central pulmonary embolus. Limited evaluation more distally due to timing of contrast. 2. Enlarged main pulmonary artery-correlate for pulmonary hypertension. 3. Partial collapse of left lower lobe. Interval worsening of patchy airspace opacities of the left lower lobe and lingula. Findings may represent a combination of atelectasis as well as infection/inflammation. 4. Interval increase in small to moderate volume left pleural effusion. 5. Interval increase in size and number of indeterminate scattered pulmonary nodules within the right lung with largest measuring up to 1.3 cm in the upper lobes. 6. Interval development of an enlarged right hilar lymph node measuring to 1.8 cm. Findings likely reactive in etiology. Recommend attention on follow-up. 7. Redemonstrations of lytic lesion involving the majority of the T6-T7 levels with associated similar-appearing 9 x 4.5 cm soft tissue mass along the left paraspinal resection bed. Redemonstrations of resorption of the left posterior seventh rib. 8.  Aortic Atherosclerosis (ICD10-I70.0).  9/8: Chest xray>IMPRESSION: 1. Moderate left pleural effusion with left basilar opacity, which may represent atelectasis or pneumonia. Increasing opacity in the left upper lobe. 2. Interstitial prominence in the right lung, which may reflect edema.  Interim History / Subjective:    -see significant events  Micro Data:  9/8: SARS-CoV-2 PCR> negative 9/8: Influenza PCR> negative 9/8: Blood culture x2> 9/8: MRSA PCR>>  9/8: Strep pneumo urinary antigen> 9/8: Legionella urinary antigen>  Antimicrobials:  Azithromycin 9/8> Ceftriaxone 9/8>  OBJECTIVE  Blood pressure 113/60, pulse 78, temperature 98.1 F (36.7 C), temperature source Oral, resp. rate 18, height 5\' 2"  (1.575 m), weight 94.5  kg, SpO2  97%.    FiO2 (%):  [40 %] 40 %   Intake/Output Summary (Last 24 hours) at 02/24/2023 0352 Last data filed at 02/24/2023 0100 Gross per 24 hour  Intake 350 ml  Output 800 ml  Net -450 ml   Filed Weights   02/23/23 1917 02/23/23 2315  Weight: 101.4 kg 94.5 kg   Physical Examination  GENERAL:57 year-old critically ill patient lying in the bed in mild acute respiratory distress EYES: PEERLA. No scleral icterus. Extraocular muscles intact.  HEENT: Head atraumatic, normocephalic. Oropharynx and nasopharynx clear.  NECK:  No JVD, supple  LUNGS: Decreased breath sounds bilaterally.  Diffuse wheezing bilaterally.  Mild to moderate use of accessory muscles of respiration.  CARDIOVASCULAR: S1, S2 normal. No murmurs, rubs, or gallops.  ABDOMEN: Soft, NTND EXTREMITIES: Bilateral 2+ pitting edema of lower extremities.  Capillary refill < 3 seconds in all extremities. Pulses palpable distally. NEUROLOGIC: The patient is lethargic and altered. No focal neurological deficit appreciated. Cranial nerves are intact.  SKIN: No obvious rash, lesion, or ulcer. Warm to touch Labs/imaging that I havepersonally reviewed  (right click and "Reselect all SmartList Selections" daily)     Labs   CBC: Recent Labs  Lab 02/19/23 0541 02/19/23 2001 02/20/23 0421 02/21/23 0836 02/23/23 1921 02/24/23 0252  WBC 7.8  --  6.8 3.5* 5.0 2.6*  NEUTROABS  --   --   --   --  4.3  --   HGB 7.1* 8.2* 8.2* 7.7* 8.5* 7.8*  HCT 21.3* 24.5* 25.1* 22.9* 26.0* 24.1*  MCV 93.0  --  93.7 93.9 95.6 94.9  PLT 341  --  354 252 235 220    Basic Metabolic Panel: Recent Labs  Lab 02/18/23 0533 02/19/23 0541 02/20/23 0421 02/21/23 0836 02/23/23 1921 02/24/23 0252  NA 133* 133* 134* 137 135 136  K 3.9 3.9 3.7 3.5 4.0 3.9  CL 104 104 102 105 101 102  CO2 21* 21* 22 24 25 25   GLUCOSE 107* 131* 93 112* 162* 137*  BUN 33* 29* 30* 23* 20 18  CREATININE 1.92* 1.83* 1.63* 1.50* 1.42* 1.46*  CALCIUM 8.6* 8.5* 8.4* 8.6* 9.0  8.6*  MG 1.8 1.7 1.7 1.4*  --  2.3  PHOS  --   --   --   --   --  4.4   GFR: Estimated Creatinine Clearance: 44.5 mL/min (A) (by C-G formula based on SCr of 1.46 mg/dL (H)). Recent Labs  Lab 02/20/23 0421 02/21/23 0836 02/23/23 1921 02/23/23 2120 02/24/23 0252  PROCALCITON  --   --   --  0.15  --   WBC 6.8 3.5* 5.0  --  2.6*  LATICACIDVEN  --   --  0.5 0.7  --     Liver Function Tests: Recent Labs  Lab 02/23/23 1921  AST 13*  ALT 9  ALKPHOS 71  BILITOT 0.4  PROT 8.1  ALBUMIN 2.2*   No results for input(s): "LIPASE", "AMYLASE" in the last 168 hours. No results for input(s): "AMMONIA" in the last 168 hours.  ABG    Component Value Date/Time   HCO3 26.6 02/23/2023 1921   ACIDBASEDEF 2.5 (H) 02/23/2023 1921   O2SAT 61.1 02/23/2023 1921     Coagulation Profile: No results for input(s): "INR", "PROTIME" in the last 168 hours.  Cardiac Enzymes: No results for input(s): "CKTOTAL", "CKMB", "CKMBINDEX", "TROPONINI" in the last 168 hours.  HbA1C: Hgb A1c MFr Bld  Date/Time Value Ref Range Status  10/25/2021 08:58  AM 6.2 4.6 - 6.5 % Final    Comment:    Glycemic Control Guidelines for People with Diabetes:Non Diabetic:  <6%Goal of Therapy: <7%Additional Action Suggested:  >8%   10/27/2020 08:31 AM 5.6 4.6 - 6.5 % Final    Comment:    Glycemic Control Guidelines for People with Diabetes:Non Diabetic:  <6%Goal of Therapy: <7%Additional Action Suggested:  >8%     CBG: Recent Labs  Lab 02/23/23 1920  GLUCAP 144*    Review of Systems:   Unable to be obtained secondary to the patient's altered mental status.    Past Medical History  She,  has a past medical history of Anxiety, Arthritis, Asthma, Barrett's esophagus (2015), COVID-19, Depression, GERD (gastroesophageal reflux disease), Hypertension, Hypothyroidism, Pneumonia, Smoldering myeloma, and Thyroid disease.   Surgical History    Past Surgical History:  Procedure Laterality Date   ABLATION  2006    APPLICATION OF INTRAOPERATIVE CT SCAN N/A 01/28/2023   Procedure: APPLICATION OF INTRAOPERATIVE CT SCAN;  Surgeon: Loreen Freud, MD;  Location: [facility] ORS;  Service: Neurosurgery;  Laterality: N/A;   BREAST CYST ASPIRATION Left 1998   BREAST SURGERY     cyst aspiration   BREAST SURGERY  1998   cyst aspiration   COLONOSCOPY  03/2014   COLONOSCOPY WITH PROPOFOL N/A 01/06/2020   Procedure: COLONOSCOPY WITH PROPOFOL;  Surgeon: Toledo, Boykin Nearing, MD;  Location: [facility] ENDOSCOPY;  Service: Gastroenterology;  Laterality: N/A;   ENDOMETRIAL ABLATION     ESOPHAGOGASTRODUODENOSCOPY (EGD) WITH PROPOFOL N/A 08/15/2015   Procedure: ESOPHAGOGASTRODUODENOSCOPY (EGD) WITH PROPOFOL;  Surgeon: Christena Deem, MD;  Location: [facility] ENDOSCOPY;  Service: Endoscopy;  Laterality: N/A;   ESOPHAGOGASTRODUODENOSCOPY (EGD) WITH PROPOFOL N/A 01/22/2016   Procedure: ESOPHAGOGASTRODUODENOSCOPY (EGD) WITH PROPOFOL;  Surgeon: Christena Deem, MD;  Location: [facility] ENDOSCOPY;  Service: Endoscopy;  Laterality: N/A;   ESOPHAGOGASTRODUODENOSCOPY (EGD) WITH PROPOFOL N/A 01/06/2020   Procedure: ESOPHAGOGASTRODUODENOSCOPY (EGD) WITH PROPOFOL;  Surgeon: Toledo, Boykin Nearing, MD;  Location: [facility] ENDOSCOPY;  Service: Gastroenterology;  Laterality: N/A;   GASTRIC BYPASS  2005   HERNIA REPAIR     IR BONE MARROW BIOPSY & ASPIRATION  02/05/2023   NASAL SINUS SURGERY     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   TOTAL THYROIDECTOMY     TUBAL LIGATION       Social History   reports that she has been smoking cigarettes. She started smoking about 42 years ago. She has a 42.9 pack-year smoking history. She has never used smokeless tobacco. She reports current alcohol use of about 10.0 - 12.0 standard drinks of alcohol per week. She reports that she does not use drugs.   Family History   Her family history includes Alcohol abuse in her father; Anxiety disorder in her brother and mother; Arthritis in her father and mother; COPD in her father  and mother; Cancer in her brother and mother; Depression in her brother and mother; Diabetes in her maternal grandfather, maternal grandmother, paternal grandfather, and paternal grandmother; Drug abuse in her brother and mother; Heart attack in her brother and father; Heart disease in her brother, brother, father, and mother; Lupus in her mother. There is no history of Breast cancer.   Allergies Allergies  Allergen Reactions   Hctz [Hydrochlorothiazide]     Hyponatremia      Home Medications  Prior to Admission medications   Medication Sig Start Date End Date Taking? Authorizing Provider  acyclovir (ZOVIRAX) 400 MG tablet Take 1 tablet (400  mg total) by mouth 2 (two) times daily. 02/11/23  Yes Creig Hines, MD  albuterol (VENTOLIN HFA) 108 (90 Base) MCG/ACT inhaler Inhale 2 puffs into the lungs every 6 (six) hours as needed for wheezing or shortness of breath. 02/06/23  Yes Enedina Finner, MD  allopurinol (ZYLOPRIM) 100 MG tablet Take 1 tablet (100 mg total) by mouth daily. 02/19/23  Yes Darlin Priestly, MD  amLODipine (NORVASC) 10 MG tablet Take 1 tablet (10 mg total) by mouth daily. 11/01/22  Yes Kara Dies, NP  amoxicillin-clavulanate (AUGMENTIN) 875-125 MG tablet Take 1 tablet by mouth every 12 (twelve) hours for 7 days. 02/18/23 02/25/23 Yes Darlin Priestly, MD  aspirin EC 81 MG tablet Take 1 tablet (81 mg total) by mouth daily. Swallow whole. 02/18/23  Yes Darlin Priestly, MD  azithromycin (ZITHROMAX) 500 MG tablet Take 1 tablet (500 mg total) by mouth daily. 02/20/23  Yes Sunnie Nielsen, DO  butalbital-acetaminophen-caffeine (FIORICET) [phone] MG tablet Take 1 tablet by mouth every 4 (four) hours as needed for headache. 02/18/23  Yes Darlin Priestly, MD  cetirizine (ZYRTEC) 10 MG tablet Take 1 tablet (10 mg total) by mouth daily. 03/20/22  Yes McLean-Scocuzza, Pasty Spillers, MD  clonazePAM (KLONOPIN) 1 MG tablet Take 1 tablet (1 mg total) by mouth 2 (two) times daily. 11/20/22  Yes Karamalegos, Netta Neat, DO   cyanocobalamin (VITAMIN B12) 1000 MCG/ML injection INJECT 1 ML IN THE MUSCLE EVERY 30 DAYS 03/15/22  Yes McLean-Scocuzza, Pasty Spillers, MD  cyclobenzaprine (FLEXERIL) 10 MG tablet Take 0.5-1 tablets (5-10 mg total) by mouth 3 (three) times daily as needed for muscle spasms. 01/09/23  Yes Karamalegos, Netta Neat, DO  dexamethasone (DECADRON) 4 MG tablet Take 10 tablets (40 mg) on Day 15 of each cycle. Repeat every 21 days.Take with breakfast. 02/11/23  Yes Creig Hines, MD  docusate sodium (COLACE) 100 MG capsule Take 1 capsule (100 mg total) by mouth 2 (two) times daily. 02/06/23  Yes Enedina Finner, MD  escitalopram (LEXAPRO) 10 MG tablet Take 1 tablet (10 mg total) by mouth daily. 11/20/22  Yes Karamalegos, Netta Neat, DO  fentaNYL (DURAGESIC) 50 MCG/HR Place 1 patch onto the skin every 3 (three) days. 02/22/23  Yes Sunnie Nielsen, DO  gabapentin (NEURONTIN) 100 MG capsule Take 1 capsule (100 mg total) by mouth 3 (three) times daily. 02/06/23  Yes Enedina Finner, MD  HYDROmorphone (DILAUDID) 2 MG tablet Take 0.5-1 tablets (1-2 mg total) by mouth every 6 (six) hours as needed for severe pain. 02/18/23  Yes Darlin Priestly, MD  ipratropium-albuterol (DUONEB) 0.5-2.5 (3) MG/3ML SOLN Take 3 mLs by nebulization 4 (four) times daily. 02/20/23  Yes Sunnie Nielsen, DO  levothyroxine (SYNTHROID) 125 MCG tablet Take 250 mcg by mouth daily before breakfast. 11/20/22  Yes [provider]  mometasone-formoterol (DULERA) 200-5 MCG/ACT AERO Inhale 2 puffs into the lungs 2 (two) times daily. 02/06/23  Yes Enedina Finner, MD  ondansetron (ZOFRAN) 8 MG tablet Take 1 tablet (8 mg total) by mouth 2 (two) times daily as needed for nausea or vomiting. Start on day 3 after Cytoxan. 02/11/23  Yes Creig Hines, MD  pantoprazole (PROTONIX) 40 MG tablet 30 min before lunch or dinner 03/20/22  Yes McLean-Scocuzza, Pasty Spillers, MD  polyethylene glycol (MIRALAX / GLYCOLAX) 17 g packet Take 17 g by mouth 2 (two) times daily. 02/06/23  Yes Enedina Finner, MD  prochlorperazine (COMPAZINE) 10 MG tablet Take 1 tablet (10 mg total) by mouth every 6 (six) hours as needed  for nausea or vomiting. 02/11/23  Yes Creig Hines, MD  sodium bicarbonate 650 MG tablet Take 1 tablet (650 mg total) by mouth 3 (three) times daily. 02/18/23 03/20/23 Yes Darlin Priestly, MD  SYRINGE-NEEDLE, DISP, 3 ML 25G X 1-1/2" 3 ML MISC 1 Device by Does not apply route every 30 (thirty) days. With B12 shot 10/25/21  Yes McLean-Scocuzza, Pasty Spillers, MD  umeclidinium bromide (INCRUSE ELLIPTA) 62.5 MCG/ACT AEPB Inhale 1 puff into the lungs daily. 02/07/23  Yes Enedina Finner, MD  Vitamin D, Ergocalciferol, (DRISDOL) 1.25 MG (50000 UNIT) CAPS capsule Take 50,000 Units by mouth every 7 (seven) days.   Yes [provider]  zolpidem (AMBIEN) 10 MG tablet Take 0.5-1 tablets (5-10 mg total) by mouth at bedtime as needed for sleep. 11/20/22  Yes Karamalegos, Netta Neat, DO  Spacer/Aero-Holding Chambers (AEROCHAMBER MV) inhaler Use as instructed 12/13/22   Raechel Chute, MD  Scheduled Meds:  budesonide (PULMICORT) nebulizer solution  0.25 mg Nebulization BID   enoxaparin (LOVENOX) injection  0.5 mg/kg Subcutaneous Q24H   furosemide  20 mg Intravenous Daily   ipratropium-albuterol  3 mL Nebulization Q6H   [START ON 03/03/2023] levothyroxine  75 mcg Intravenous Daily   methylPREDNISolone (SOLU-MEDROL) injection  40 mg Intravenous Daily   pantoprazole (PROTONIX) IV  40 mg Intravenous Q24H   Continuous Infusions:  azithromycin Stopped (02/23/23 2213)   cefTRIAXone (ROCEPHIN)  IV Stopped (02/23/23 2120)   PRN Meds:.docusate sodium, ipratropium-albuterol, polyethylene glycol   Active Hospital Problem list   See systems below  Assessment & Plan:   #Acute on Chronic Hypoxic and Hypercapnic Respiratory Failure #Acute Exacerbation of COPD #Bilateral Pneumonia #Moderate Left Pleural Effusion Background Asthma, underlying COPD, current everyday smoker on triple therapy at home and oxygen @2L .  Now with hypoxic respiratory failure requiring BiPAP therapy.  CTA negative for PE given    -Supplemental O2 as needed to maintain O2 saturations 88 to 92% -BiPAP, wean as tolerated -High risk for intubation -Follow intermittent Chest X-ray & ABG as needed -Bronchodilators and Pulmicort nebs -IV Solu-Medrol 40 mg daily -Received 40 mg IV Lasix x 1, continue with IV Lasix 20 mg daily -Antibiotics as below -Smoking Cessation counseling    #Sepsis due to Suspected Pneumonia Initial interventions/workup included: 2 L of NS/LR & Ceftriaxone and Azithromycin -F/u cultures, trend lactic/ PCT -Obtain strep pneumo and Legionella antigen -Monitor WBC/ fever curve -IV antibiotics Ceftriaxone AND Azithromycin -Gentle IVF hydration as needed -Pressors PRN for MAP goal >65 -Strict I/O's  #Altered Mental Status -CTH neg -Likely multifactorial in the setting of chronic narcotic and sedative use, and hypercapnia -treat medical conditions as above -Avoid sedatives as able  #Chronic pain syndrome Hx of 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 Smith Robert for treatment -Consider inpatient oncology consult  #Hypothyroidism -Check TSH and free T4 -Continue home Synthroid  #AKI lived to be in the setting of her multiple myeloma -Monitor I&O's / urinary output -Follow BMP -Ensure adequate renal perfusion -Avoid nephrotoxic agents as able -Replace electrolytes as indicated   #Anxiety and Depression -Resume home Lexapro and Klonopin once able to take p.o.    Best practice:  Diet:  NPO Pain/Anxiety/Delirium protocol (if indicated): No VAP protocol (if indicated): Not indicated DVT prophylaxis: LMWH GI prophylaxis: PPI Glucose control:  SSI No Central venous access:  N/A Arterial  line:   N/A Foley:  Yes, and it is still needed Mobility:  bed rest  PT consulted: N/A Last date of multidisciplinary goals of care discussion [UNABLE TO PERFORM] Code Status:  full code Disposition: ICU   = Goals of Care = Code Status Order: FULL  Primary Emergency Contact: Ward,Tony Patient is currently altered and unable to discuss goals of care we will continue with  full aggressive treatment and intervention options, including CPR and intubation, but goals of care will be addressed on going with patient as mental status improves and family if that should become necessary.  Critical care time: 45 minutes        Webb Silversmith DNP, CCRN, FNP-C, AGACNP-BC Acute Care & Family Nurse Practitioner [facility] PCCM on call pager [phone]

## 2023-02-23 NOTE — ED Provider Notes (Signed)
[facility] Provider Note   Event Date/Time   First MD Initiated Contact with Patient 02/23/23 1919     (approximate) History  Respiratory Distress  HPI Paula Garrett is a 57 y.o. female with a stated past medical history of multiple myeloma, tobacco abuse, hypertension, depression/anxiety who presents for respiratory distress via EMS.  Patient arrives with altered mental status and can only state that she has been feeling short of breath that has been worsening today.  EMS states that they did hear patient with decreased breath sounds and administered DuoNebs with mild improvement. ROS: Unable to assess   Physical Exam  Triage Vital Signs: ED Triage Vitals  Encounter Vitals Group     BP 02/23/23 1914 (!) 156/94     Systolic BP Percentile --      Diastolic BP Percentile --      Pulse Rate 02/23/23 1914 94     Resp 02/23/23 1914 (!) 29     Temp 02/23/23 1914 (!) 96.9 F (36.1 C)     Temp Source 02/23/23 1914 Axillary     SpO2 02/23/23 1914 99 %     Weight 02/23/23 1917 223 lb 9.6 oz (101.4 kg)     Height 02/23/23 1917 5\' 6"  (1.676 m)     Head Circumference --      Peak Flow --      Pain Score --      Pain Loc --      Pain Education --      Exclude from Growth Chart --    Most recent vital signs: Vitals:   02/23/23 2100 02/23/23 2130  BP: 121/69 126/72  Pulse: 77 77  Resp: (!) 22 (!) 21  Temp:    SpO2: 98% 97%   General: Awake, disoriented.  GCS 12 CV:  Good peripheral perfusion.  Resp:  Normal effort.  Abd:  No distention.  Other:  Middle-aged morbidly obese Caucasian female laying in stretcher on BiPAP in moderate respiratory distress ED Results / Procedures / Treatments  Labs (all labs ordered are listed, but only abnormal results are displayed) Labs Reviewed  BLOOD GAS, VENOUS - Abnormal; Notable for the following components:      Result Value   pH, Ven 7.22 (*)    pCO2, Ven 65 (*)    Acid-base deficit 2.5 (*)    All other  components within normal limits  COMPREHENSIVE METABOLIC PANEL - Abnormal; Notable for the following components:   Glucose, Bld 162 (*)    Creatinine, Ser 1.42 (*)    Albumin 2.2 (*)    AST 13 (*)    GFR, Estimated 43 (*)    All other components within normal limits  CBC WITH DIFFERENTIAL/PLATELET - Abnormal; Notable for the following components:   RBC 2.72 (*)    Hemoglobin 8.5 (*)    HCT 26.0 (*)    Lymphs Abs 0.4 (*)    Abs Immature Granulocytes 0.08 (*)    All other components within normal limits  BRAIN NATRIURETIC PEPTIDE - Abnormal; Notable for the following components:   B Natriuretic Peptide 347.2 (*)    All other components within normal limits  CBG MONITORING, ED - Abnormal; Notable for the following components:   Glucose-Capillary 144 (*)    All other components within normal limits  RESP PANEL BY RT-PCR (RSV, FLU A&B, COVID)  RVPGX2  CULTURE, BLOOD (ROUTINE X 2)  CULTURE, BLOOD (ROUTINE X 2)  LACTIC ACID, PLASMA  LACTIC ACID,  PLASMA  URINALYSIS, COMPLETE (UACMP) WITH MICROSCOPIC  URINE DRUG SCREEN, QUALITATIVE ([facility] ONLY)  PROCALCITONIN  LEGIONELLA PNEUMOPHILA SEROGP 1 UR AG  STREP PNEUMONIAE URINARY ANTIGEN  PHOSPHORUS  MAGNESIUM  BASIC METABOLIC PANEL  CBC  TROPONIN I (HIGH SENSITIVITY)  TROPONIN I (HIGH SENSITIVITY)   RADIOLOGY ED MD interpretation: Single view chest x-ray interpreted independently by me and shows moderate left pleural effusion with left basilar opacity likely representing atelectasis versus pneumonia with increasing opacity in the left upper lobe and interstitial prominence in the right lung which may reflect edema. -Agree with radiology assessment Official radiology report(s): CT Angio Chest PE W/Cm &/Or Wo Cm  Result Date: 02/23/2023 CLINICAL DATA:  Pulmonary embolism (PE) suspected, high prob l sided opacity on CXR EXAM: CT ANGIOGRAPHY CHEST WITH CONTRAST TECHNIQUE: Multidetector CT imaging of the chest was performed using the standard  protocol during bolus administration of intravenous contrast. Multiplanar CT image reconstructions and MIPs were obtained to evaluate the vascular anatomy. RADIATION DOSE REDUCTION: This exam was performed according to the departmental dose-optimization program which includes automated exposure control, adjustment of the mA and/or kV according to patient size and/or use of iterative reconstruction technique. CONTRAST:  75mL OMNIPAQUE IOHEXOL 350 MG/ML SOLN COMPARISON:  CT chest in 01/24/2023, PET CT 02/12/2023 FINDINGS: Cardiovascular: Poor opacification of the pulmonary arteries due to timing of contrast. No evidence of central pulmonary embolism. The main pulmonary artery is enlarged in caliber measuring up to 3.6 cm. Normal heart size. No significant pericardial effusion. The thoracic aorta is normal in caliber. Mild atherosclerotic plaque of the thoracic aorta. Four-vessel coronary artery calcifications. Mediastinum/Nodes: Interval development of an enlarged right hilar lymph node measuring to 1.8 cm. No enlarged mediastinal, left hilar, or axillary lymph nodes. Thyroid gland, trachea, and esophagus demonstrate no significant findings. Lungs/Pleura: Partial collapse of left lower lobe. Interval worsening of patchy airspace opacities of the left lower lobe and lingula. Interval increase in size and number right upper lobe cluster of spiculated nodularities with nodule measuring up to 1.3 cm. Persistent scattered pulmonary nodules within the right lower lobe. Similar findings within the right middle lobe. No pulmonary mass. Interval increase in small to moderate volume left pleural effusion . No pneumothorax. Upper Abdomen: Stable 2.8 cm right hepatic hypodensity. Gastric surgical changes with small hiatal hernia. Musculoskeletal: No chest wall abnormality. No acute displaced fracture. T4-T9 posterolateral surgical hardware in the setting of a lytic lesion involving the majority of the T6-T7 levels. Associated  similar-appearing 9 x 4.5 cm (5:64) soft tissue mass along the left paraspinal resection bed. Redemonstration of resorption of the left posterior seventh rib. Review of the MIP images confirms the above findings. IMPRESSION: 1. Poor opacification of the pulmonary arteries due to timing of contrast. No central pulmonary embolus. Limited evaluation more distally due to timing of contrast. 2. Enlarged main pulmonary artery-correlate for pulmonary hypertension. 3. Partial collapse of left lower lobe. Interval worsening of patchy airspace opacities of the left lower lobe and lingula. Findings may represent a combination of atelectasis as well as infection/inflammation. 4. Interval increase in small to moderate volume left pleural effusion . 5. Interval increase in size and number of indeterminate scattered pulmonary nodules within the right lung with largest measuring up to 1.3 cm in the upper lobes. 6. Interval development of an enlarged right hilar lymph node measuring to 1.8 cm. Findings likely reactive in etiology. Recommend attention on follow-up. 7. Redemonstration of lytic lesion involving the majority of the T6-T7 levels with associated similar-appearing 9  x 4.5 cm soft tissue mass along the left paraspinal resection bed. Redemonstration of resorption of the left posterior seventh rib. 8.  Aortic Atherosclerosis (ICD10-I70.0). Electronically Signed   By: Tish Frederickson M.D.   On: 02/23/2023 22:42   CT HEAD WO CONTRAST ( )  Result Date: 02/23/2023 CLINICAL DATA:  Altered mental status EXAM: CT HEAD WITHOUT CONTRAST TECHNIQUE: Contiguous axial images were obtained from the base of the skull through the vertex without intravenous contrast. RADIATION DOSE REDUCTION: This exam was performed according to the departmental dose-optimization program which includes automated exposure control, adjustment of the mA and/or kV according to patient size and/or use of iterative reconstruction technique. COMPARISON:   02/03/2023 FINDINGS: Brain: There is no mass, hemorrhage or extra-axial collection. The size and configuration of the ventricles and extra-axial CSF spaces are normal. The brain parenchyma is normal, without acute or chronic infarction. Vascular: No abnormal hyperdensity of the major intracranial arteries or dural venous sinuses. No intracranial atherosclerosis. Skull: The visualized skull base, calvarium and extracranial soft tissues are normal. Sinuses/Orbits: No fluid levels or advanced mucosal thickening of the visualized paranasal sinuses. No mastoid or middle ear effusion. The orbits are normal. IMPRESSION: Normal head CT. Electronically Signed   By: Deatra Robinson M.D.   On: 02/23/2023 22:30   DG Chest Port 1 View  Result Date: 02/23/2023 CLINICAL DATA:  Shortness of breath EXAM: PORTABLE CHEST 1 VIEW COMPARISON:  02/18/2023 FINDINGS: Stable cardiomegaly. Moderate left pleural effusion with left basilar opacity. Increasing opacity in the left upper lobe. Interstitial prominence in the right lung. No right-sided pleural effusion. No pneumothorax. IMPRESSION: 1. Moderate left pleural effusion with left basilar opacity, which may represent atelectasis or pneumonia. Increasing opacity in the left upper lobe. 2. Interstitial prominence in the right lung, which may reflect edema. Electronically Signed   By: Duanne Guess D.O.   On: 02/23/2023 20:18   PROCEDURES: Critical Care performed: Yes, see critical care procedure note(s) .1-3 Lead EKG Interpretation  Performed by: Merwyn Katos, MD Authorized by: Merwyn Katos, MD     Interpretation: normal     ECG rate:  71   ECG rate assessment: normal     Rhythm: sinus rhythm     Ectopy: none     Conduction: normal   CRITICAL CARE Performed by: Merwyn Katos  Total critical care time: 37 minutes  Critical care time was exclusive of separately billable procedures and treating other patients.  Critical care was necessary to treat or prevent  imminent or life-threatening deterioration.  Critical care was time spent personally by me on the following activities: development of treatment plan with patient and/or surrogate as well as nursing, discussions with consultants, evaluation of patient's response to treatment, examination of patient, obtaining history from patient or surrogate, ordering and performing treatments and interventions, ordering and review of laboratory studies, ordering and review of radiographic studies, pulse oximetry and re-evaluation of patient's condition.  MEDICATIONS ORDERED IN ED: Medications  cefTRIAXone (ROCEPHIN) 2 g in sodium chloride 0.9 % 100 mL IVPB (0 g Intravenous Stopped 02/23/23 2120)  azithromycin (ZITHROMAX) 500 mg in sodium chloride 0.9 % 250 mL IVPB (0 mg Intravenous Stopped 02/23/23 2213)  docusate sodium (COLACE) capsule 100 mg (has no administration in time range)  polyethylene glycol (MIRALAX / GLYCOLAX) packet 17 g (has no administration in time range)  ipratropium-albuterol (DUONEB) 0.5-2.5 (3) MG/3ML nebulizer solution 3 mL ( Nebulization Canceled Entry 02/23/23 2304)  ipratropium-albuterol (DUONEB) 0.5-2.5 (3) MG/3ML nebulizer solution 3  mL (has no administration in time range)  budesonide (PULMICORT) nebulizer solution 0.25 mg ( Nebulization Canceled Entry 02/23/23 2304)  enoxaparin (LOVENOX) injection 50 mg (50 mg Subcutaneous Given 02/23/23 2214)  magnesium sulfate IVPB 4 g 100 mL (has no administration in time range)  ipratropium-albuterol (DUONEB) 0.5-2.5 (3) MG/3ML nebulizer solution 3 mL (3 mLs Nebulization Given 02/23/23 1930)  LORazepam (ATIVAN) injection 0.5 mg (0.5 mg Intravenous Given 02/23/23 2015)  iohexol (OMNIPAQUE) 350 MG/ML injection 75 mL (75 mLs Intravenous Contrast Given 02/23/23 2201)  ketorolac (TORADOL) 30 MG/ML injection 15 mg (15 mg Intravenous Given 02/23/23 2212)  furosemide (LASIX) injection 40 mg (40 mg Intravenous Given 02/23/23 2330)   IMPRESSION / MDM / ASSESSMENT AND PLAN /  ED COURSE  I reviewed the triage vital signs and the nursing notes.                             The patient is on the cardiac monitor to evaluate for evidence of arrhythmia and/or significant heart rate changes. Patient's presentation is most consistent with acute presentation with potential threat to life or bodily function. Presents with shortness of breath, cough, and malaise concerning for pneumonia.  DDx: PE, COPD exacerbation, Pneumothorax, TB, Atypical ACS, Esophageal Rupture, Toxic Exposure, Foreign Body Airway Obstruction.  Workup: CXR CBC, CMP, lactate, troponin  Given History, Exam, and Workup presentation most consistent with pneumonia.  Findings: Increasing left-sided opacity on chest x-ray concerning for developing pneumonia  Tx: Ceftriaxone 1g IV Azithromycin 500mg  IV  Reassessment: As patient is continuing to require supplemental oxygenation for acute hypoxic respiratory failure, patient will require admission to the internal medicine service for further evaluation and management  Disposition: Admit to the ICU   FINAL CLINICAL IMPRESSION(S) / ED DIAGNOSES   Final diagnoses:  Acute respiratory failure with hypoxia (HCC)  Pneumonia of left lower lobe due to infectious organism   Rx / DC Orders   ED Discharge Orders     None      Note:  This document was prepared using Dragon voice recognition software and may include unintentional dictation errors.   Merwyn Katos, MD 02/23/23 [phone]

## 2023-02-23 NOTE — Sepsis Progress Note (Signed)
Elink following code sepsis °

## 2023-02-23 NOTE — ED Triage Notes (Signed)
Pt BIB [facility] from home, hx cancer w/ mets (no other details known.) 2L's @ baseline, non compliant. EMS found pt in 70's w/ acc muscle use. PTA 2 albuterol, 1 duo neb, 125 solu medrol, 5 dilaudid, fentanyl patch present on l arm. Pt responsive to pain only atm.

## 2023-02-23 NOTE — Progress Notes (Signed)
eLink Physician-Brief Progress Note Patient Name: Paula Garrett DOB: 1965/08/24 MRN: [id number]   Date of Service  02/23/2023  HPI/Events of Note  57 year old female with a history of essential hypertension, depression, anxiety and tobacco use disorder with multiple myeloma who was hospitalized recently for chest pain and shortness of breath treated for acute kidney injury and hyperkalemia who returns to the emergency department today for acute on chronic hypoxic respiratory failure with sats in the 70s and accessory muscle use.  Patient brought in with mild tachypnea, some hypertension now saturating 97% on BiPAP.  Initial results consistent with acute hypercapnia with pCO2 65.  Metabolic panel with mild hyperglycemia and elevated creatinine.  CBC with hemoglobin of 8.5.  Blood cultures were sent.  CT pulmonary arteries without evidence of PE.  There is evidence of fluid overload and lumbar collapse.  With pleural effusion.  Hilar lymphadenopathy.  Unremarkable CT head.  eICU Interventions  Maintain broad-spectrum antibiotics, scheduled SVNs  Agree with ongoing diuresis.  DVT prophylaxis with enoxaparin GI prophylaxis not indicated.     Intervention Category Evaluation Type: New Patient Evaluation  Jaimie Redditt 02/23/2023, 11:09 PM

## 2023-02-23 NOTE — Progress Notes (Signed)
CODE SEPSIS - PHARMACY COMMUNICATION  **Broad Spectrum Antibiotics should be administered within 1 hour of Sepsis diagnosis**  Time Code Sepsis Called/Page Received: 9/8 @ 2033  Antibiotics Ordered: Azithromycin and ceftriaxone  Time of 1st antibiotic administration: 2045  Additional action taken by pharmacy: N/A  If necessary, Name of Provider/Nurse Contacted: N/A    Merryl Hacker ,PharmD Clinical Pharmacist  02/23/2023  8:36 PM

## 2023-02-23 NOTE — ED Notes (Signed)
Patient transported to CT accompanied by this RN and on CCM.

## 2023-02-23 NOTE — ED Notes (Signed)
Pt transported to ICU by Desmond Dike and Doree Fudge EDT on CCM

## 2023-02-23 NOTE — Progress Notes (Signed)
PHARMACY CONSULT NOTE - FOLLOW UP  Pharmacy Consult for Electrolyte Monitoring and Replacement   Recent Labs: Potassium (mmol/L)  Date Value  02/23/2023 4.0   Magnesium (mg/dL)  Date Value  69/62/9528 1.4 (L)   Calcium (mg/dL)  Date Value  41/32/4401 9.0   Albumin (g/dL)  Date Value  02/72/5366 2.2 (L)   Phosphorus (mg/dL)  Date Value  44/08/4740 6.0 (H)   Sodium (mmol/L)  Date Value  02/23/2023 135     Assessment: Mag level low at 1.4 Potassium level WNL  Goal of Therapy:  Electrolytes WNL  Plan:  Will order Mag Sulfate 4 g IV x 1  Monitor BMP and Mag with AM labs  Merryl Hacker ,PharmD Clinical Pharmacist 02/23/2023 9:09 PM

## 2023-02-24 ENCOUNTER — Inpatient Hospital Stay: Payer: Managed Care, Other (non HMO)

## 2023-02-24 ENCOUNTER — Other Ambulatory Visit (HOSPITAL_COMMUNITY): Payer: Self-pay

## 2023-02-24 ENCOUNTER — Inpatient Hospital Stay: Payer: Managed Care, Other (non HMO) | Admitting: Hospice and Palliative Medicine

## 2023-02-24 DIAGNOSIS — Z515 Encounter for palliative care: Secondary | ICD-10-CM

## 2023-02-24 DIAGNOSIS — J9621 Acute and chronic respiratory failure with hypoxia: Secondary | ICD-10-CM

## 2023-02-24 LAB — BASIC METABOLIC PANEL
Anion gap: 9 (ref 5–15)
BUN: 18 mg/dL (ref 6–20)
CO2: 25 mmol/L (ref 22–32)
Calcium: 8.6 mg/dL — ABNORMAL LOW (ref 8.9–10.3)
Chloride: 102 mmol/L (ref 98–111)
Creatinine, Ser: 1.46 mg/dL — ABNORMAL HIGH (ref 0.44–1.00)
GFR, Estimated: 41 mL/min — ABNORMAL LOW (ref >60)
Glucose, Bld: 137 mg/dL — ABNORMAL HIGH (ref 70–99)
Potassium: 3.9 mmol/L (ref 3.5–5.1)
Sodium: 136 mmol/L (ref 135–145)

## 2023-02-24 LAB — CBC
HCT: 24.1 % — ABNORMAL LOW (ref 36.0–46.0)
Hemoglobin: 7.8 g/dL — ABNORMAL LOW (ref 12.0–15.0)
MCH: 30.7 pg (ref 26.0–34.0)
MCHC: 32.4 g/dL (ref 30.0–36.0)
MCV: 94.9 fL (ref 80.0–100.0)
Platelets: 220 10*3/uL (ref 150–400)
RBC: 2.54 MIL/uL — ABNORMAL LOW (ref 3.87–5.11)
RDW: 14 % (ref 11.5–15.5)
WBC: 2.6 10*3/uL — ABNORMAL LOW (ref 4.0–10.5)
nRBC: 0 % (ref 0.0–0.2)

## 2023-02-24 LAB — BODY FLUID CELL COUNT WITH DIFFERENTIAL
Eos, Fluid: 0 %
Lymphs, Fluid: 56 %
Monocyte-Macrophage-Serous Fluid: 10 %
Neutrophil Count, Fluid: 34 %
Total Nucleated Cell Count, Fluid: 908 uL

## 2023-02-24 LAB — PROCALCITONIN: Procalcitonin: 0.15 ng/mL

## 2023-02-24 LAB — LACTATE DEHYDROGENASE, PLEURAL OR PERITONEAL FLUID: LD, Fluid: 121 U/L — ABNORMAL HIGH (ref 3–23)

## 2023-02-24 LAB — GLUCOSE, CAPILLARY: Glucose-Capillary: 117 mg/dL — ABNORMAL HIGH (ref 70–99)

## 2023-02-24 LAB — T4, FREE: Free T4: 1.26 ng/dL — ABNORMAL HIGH (ref 0.61–1.12)

## 2023-02-24 LAB — TSH: TSH: 0.561 u[IU]/mL (ref 0.350–4.500)

## 2023-02-24 LAB — STREP PNEUMONIAE URINARY ANTIGEN: Strep Pneumo Urinary Antigen: NEGATIVE

## 2023-02-24 LAB — PROTEIN, PLEURAL OR PERITONEAL FLUID: Total protein, fluid: 3.9 g/dL

## 2023-02-24 LAB — PHOSPHORUS: Phosphorus: 4.4 mg/dL (ref 2.5–4.6)

## 2023-02-24 LAB — MAGNESIUM: Magnesium: 2.3 mg/dL (ref 1.7–2.4)

## 2023-02-24 MED ORDER — ADULT MULTIVITAMIN W/MINERALS CH
1.0000 | ORAL_TABLET | Freq: Every day | ORAL | Status: DC
  Administered 2023-02-24 – 2023-02-27 (×4): 1 via ORAL
  Filled 2023-02-24 (×4): qty 1

## 2023-02-24 MED ORDER — FUROSEMIDE 10 MG/ML IJ SOLN
20.0000 mg | Freq: Every day | INTRAMUSCULAR | Status: DC
  Administered 2023-02-24: 20 mg via INTRAVENOUS
  Filled 2023-02-24: qty 2

## 2023-02-24 MED ORDER — CHLORDIAZEPOXIDE HCL 25 MG PO CAPS: 25.0000 mg | ORAL_CAPSULE | Freq: Every day | ORAL | Status: DC

## 2023-02-24 MED ORDER — PANTOPRAZOLE SODIUM 40 MG IV SOLR
40.0000 mg | INTRAVENOUS | Status: DC
  Administered 2023-02-24: 40 mg via INTRAVENOUS
  Filled 2023-02-24: qty 10

## 2023-02-24 MED ORDER — METHYLPREDNISOLONE SODIUM SUCC 40 MG IJ SOLR
40.0000 mg | Freq: Every day | INTRAMUSCULAR | Status: DC
  Administered 2023-02-24 – 2023-02-26 (×3): 40 mg via INTRAVENOUS
  Filled 2023-02-24 (×4): qty 1

## 2023-02-24 MED ORDER — LOPERAMIDE HCL 2 MG PO CAPS: 2.0000 mg | ORAL_CAPSULE | ORAL | Status: DC | PRN

## 2023-02-24 MED ORDER — ACYCLOVIR 200 MG PO CAPS
400.0000 mg | ORAL_CAPSULE | Freq: Two times a day (BID) | ORAL | Status: DC
  Administered 2023-02-24 – 2023-02-27 (×6): 400 mg via ORAL
  Filled 2023-02-24 (×6): qty 2

## 2023-02-24 MED ORDER — SENNA 8.6 MG PO TABS: 1.0000 | ORAL_TABLET | Freq: Every day | ORAL | Status: DC | PRN

## 2023-02-24 MED ORDER — CHLORDIAZEPOXIDE HCL 25 MG PO CAPS
25.0000 mg | ORAL_CAPSULE | Freq: Four times a day (QID) | ORAL | Status: AC
  Administered 2023-02-24 – 2023-02-26 (×5): 25 mg via ORAL
  Filled 2023-02-24 (×5): qty 1

## 2023-02-24 MED ORDER — AZITHROMYCIN 250 MG PO TABS: 500.0000 mg | ORAL_TABLET | Freq: Every day | ORAL | Status: DC

## 2023-02-24 MED ORDER — FENTANYL 50 MCG/HR TD PT72
1.0000 | MEDICATED_PATCH | TRANSDERMAL | Status: DC
  Administered 2023-02-24: 1 via TRANSDERMAL
  Filled 2023-02-24: qty 1

## 2023-02-24 MED ORDER — LIDOCAINE HCL (PF) 1 % IJ SOLN
10.0000 mL | Freq: Once | INTRAMUSCULAR | Status: AC
  Administered 2023-02-24: 10 mL via SUBCUTANEOUS

## 2023-02-24 MED ORDER — LEVOTHYROXINE SODIUM 100 MCG/5ML IV SOLN: 75.0000 ug | Freq: Every day | INTRAVENOUS | Status: DC

## 2023-02-24 MED ORDER — GABAPENTIN 100 MG PO CAPS
100.0000 mg | ORAL_CAPSULE | Freq: Three times a day (TID) | ORAL | Status: DC
  Administered 2023-02-24 – 2023-02-27 (×9): 100 mg via ORAL
  Filled 2023-02-24 (×9): qty 1

## 2023-02-24 MED ORDER — CLONAZEPAM 1 MG PO TABS
1.0000 mg | ORAL_TABLET | Freq: Two times a day (BID) | ORAL | Status: DC | PRN
  Administered 2023-02-24: 1 mg via ORAL
  Filled 2023-02-24: qty 1

## 2023-02-24 MED ORDER — THIAMINE MONONITRATE 100 MG PO TABS
100.0000 mg | ORAL_TABLET | Freq: Every day | ORAL | Status: DC
  Administered 2023-02-24 – 2023-02-27 (×4): 100 mg via ORAL
  Filled 2023-02-24 (×4): qty 1

## 2023-02-24 MED ORDER — PANTOPRAZOLE SODIUM 40 MG PO TBEC
40.0000 mg | DELAYED_RELEASE_TABLET | Freq: Every day | ORAL | Status: DC
  Administered 2023-02-25 – 2023-02-27 (×3): 40 mg via ORAL
  Filled 2023-02-24 (×3): qty 1

## 2023-02-24 MED ORDER — CHLORDIAZEPOXIDE HCL 25 MG PO CAPS
25.0000 mg | ORAL_CAPSULE | Freq: Three times a day (TID) | ORAL | Status: AC
  Administered 2023-02-26 – 2023-02-27 (×3): 25 mg via ORAL
  Filled 2023-02-24 (×3): qty 1

## 2023-02-24 MED ORDER — ONDANSETRON 4 MG PO TBDP: 4.0000 mg | ORAL_TABLET | Freq: Four times a day (QID) | ORAL | Status: DC | PRN

## 2023-02-24 MED ORDER — ALLOPURINOL 100 MG PO TABS
100.0000 mg | ORAL_TABLET | Freq: Every day | ORAL | Status: DC
  Administered 2023-02-24 – 2023-02-27 (×4): 100 mg via ORAL
  Filled 2023-02-24 (×4): qty 1

## 2023-02-24 MED ORDER — CHLORHEXIDINE GLUCONATE CLOTH 2 % EX PADS
6.0000 | MEDICATED_PAD | Freq: Every day | CUTANEOUS | Status: DC
  Administered 2023-02-24 – 2023-02-26 (×3): 6 via TOPICAL

## 2023-02-24 MED ORDER — CHLORDIAZEPOXIDE HCL 25 MG PO CAPS: 25.0000 mg | ORAL_CAPSULE | ORAL | Status: DC

## 2023-02-24 MED ORDER — ESCITALOPRAM OXALATE 10 MG PO TABS
10.0000 mg | ORAL_TABLET | Freq: Every day | ORAL | Status: DC
  Administered 2023-02-24 – 2023-02-27 (×4): 10 mg via ORAL
  Filled 2023-02-24 (×4): qty 1

## 2023-02-24 MED ORDER — HYDROMORPHONE HCL 2 MG PO TABS
1.0000 mg | ORAL_TABLET | Freq: Four times a day (QID) | ORAL | Status: DC | PRN
  Administered 2023-02-24 – 2023-02-27 (×8): 2 mg via ORAL
  Filled 2023-02-24 (×9): qty 1

## 2023-02-24 MED ORDER — CHLORDIAZEPOXIDE HCL 25 MG PO CAPS
25.0000 mg | ORAL_CAPSULE | Freq: Four times a day (QID) | ORAL | Status: DC | PRN
  Administered 2023-02-27: 25 mg via ORAL

## 2023-02-24 MED ORDER — LEVOTHYROXINE SODIUM 50 MCG PO TABS
250.0000 ug | ORAL_TABLET | Freq: Every day | ORAL | Status: DC
  Administered 2023-02-25 – 2023-02-27 (×3): 250 ug via ORAL
  Filled 2023-02-24 (×3): qty 1

## 2023-02-24 MED ORDER — HYDROXYZINE HCL 50 MG PO TABS: 25.0000 mg | ORAL_TABLET | Freq: Four times a day (QID) | ORAL | Status: DC | PRN

## 2023-02-24 NOTE — Progress Notes (Signed)
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 Pharmacy consulted to assist with electrolyte monitoring and replacement as indicated.  Goal of Therapy:  Electrolytes within normal limits  Plan:  No electrolyte replacement indicated at this time Follow-up electrolytes with AM labs tomorrow  Tressie Ellis 02/24/2023 8:13 AM

## 2023-02-24 NOTE — Consult Note (Signed)
Palliative Medicine [facility] [phone] [phone]   Name: Paula Garrett Date: 02/24/2023 MRN: [id number]  DOB: 1965-08-05  Patient Care Team: Smitty Cords, DO as PCP - General (Family Medicine) Lemar Livings, Merrily Pew, MD (General Surgery) Shelia Media, MD (Internal Medicine) Creig Hines, MD as Consulting Physician (Oncology)    REASON FOR CONSULTATION: Paula Garrett is a 57 y.o. female with multiple medical problems including hypertension, depression, anxiety, history of tobacco abuse, and multiple myeloma, who was hospitalized 01/24/2023 to 02/06/2023 with chest pain and shortness of breath and was found to have a large soft tissue mass in the left T6-T7 paraspinal area with destruction of seventh rib and invasion of T6 and T7 vertebral bodies.  Patient underwent surgery on 8/13 with postop period complicated by pain.  Pathology positive for multiple myeloma.  Patient was readmitted 02/10/2023 to 02/21/2023 with AKI.  AKI improved with initiation of myeloma treatment.  Patient now readmitted 02/23/2023 with hypoxic hypercapnic respiratory failure.  Workup consistent with pneumonia.  She was referred to palliative care to address goals and manage ongoing symptoms.  SOCIAL HISTORY:     reports that she has been smoking cigarettes. She started smoking about 42 years ago. She has a 42.9 pack-year smoking history. She has never used smokeless tobacco. She reports current alcohol use of about 10.0 - 12.0 standard drinks of alcohol per week. She reports that she does not use drugs.  Patient is unmarried and lives at home alone.  She has a son in [place] and a daughter who lives in [place].  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   ADVANCE DIRECTIVES:  Does not have  CODE STATUS: Full code  PAST MEDICAL HISTORY: Past Medical History:  Diagnosis  Date   Anxiety    Arthritis    Asthma    Barrett's esophagus 2015   COVID-19    03/10/20   Depression    GERD (gastroesophageal reflux disease)    Hypertension    Hypothyroidism    Pneumonia    Smoldering myeloma    Thyroid disease     PAST SURGICAL HISTORY:  Past Surgical History:  Procedure Laterality Date   ABLATION  2006   APPLICATION OF INTRAOPERATIVE CT SCAN N/A 01/28/2023   Procedure: APPLICATION OF INTRAOPERATIVE CT SCAN;  Surgeon: Loreen Freud, MD;  Location: [facility] ORS;  Service: Neurosurgery;  Laterality: N/A;   BREAST CYST ASPIRATION Left 1998   BREAST SURGERY     cyst aspiration   BREAST SURGERY  1998   cyst aspiration   COLONOSCOPY  03/2014   COLONOSCOPY WITH PROPOFOL N/A 01/06/2020   Procedure: COLONOSCOPY WITH PROPOFOL;  Surgeon: Toledo, Boykin Nearing, MD;  Location: [facility] ENDOSCOPY;  Service: Gastroenterology;  Laterality: N/A;   ENDOMETRIAL ABLATION     ESOPHAGOGASTRODUODENOSCOPY (EGD) WITH PROPOFOL N/A 08/15/2015   Procedure: ESOPHAGOGASTRODUODENOSCOPY (EGD) WITH PROPOFOL;  Surgeon: Christena Deem, MD;  Location: [facility] ENDOSCOPY;  Service: Endoscopy;  Laterality: N/A;   ESOPHAGOGASTRODUODENOSCOPY (EGD) WITH PROPOFOL N/A 01/22/2016   Procedure: ESOPHAGOGASTRODUODENOSCOPY (EGD) WITH PROPOFOL;  Surgeon: Christena Deem, MD;  Location: [facility] ENDOSCOPY;  Service: Endoscopy;  Laterality: N/A;   ESOPHAGOGASTRODUODENOSCOPY (EGD) WITH PROPOFOL N/A 01/06/2020   Procedure: ESOPHAGOGASTRODUODENOSCOPY (EGD) WITH PROPOFOL;  Surgeon: Toledo, Boykin Nearing, MD;  Location: [facility] ENDOSCOPY;  Service: Gastroenterology;  Laterality: N/A;   GASTRIC BYPASS  2005   HERNIA REPAIR  IR BONE MARROW BIOPSY & ASPIRATION  02/05/2023   NASAL SINUS SURGERY     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   TOTAL THYROIDECTOMY     TUBAL LIGATION      HEMATOLOGY/ONCOLOGY HISTORY:  Oncology History  Multiple myeloma (HCC)  01/25/2023 Initial Diagnosis   Multiple myeloma (HCC)   02/11/2023 -   Chemotherapy   Patient is on Treatment Plan : MYELOMA NEWLY DIAGNOSED TRANSPLANT CANDIDATE Cyclophosphamide IV + Bortezomib SQ + Dexamethasone (CyBorD) q21d x 4 cycles       ALLERGIES:  is allergic to hctz [hydrochlorothiazide].  MEDICATIONS:  Current Facility-Administered Medications  Medication Dose Route Frequency Provider Last Rate Last Admin   acyclovir (ZOVIRAX) 200 MG capsule 400 mg  400 mg Oral BID Erin Fulling, MD       allopurinol (ZYLOPRIM) tablet 100 mg  100 mg Oral Daily Belia Heman, Kurian, MD   100 mg at 02/24/23 1257   budesonide (PULMICORT) nebulizer solution 0.25 mg  0.25 mg Nebulization BID Jimmye Norman, NP   0.25 mg at 02/24/23 6045   Chlorhexidine Gluconate Cloth 2 % PADS 6 each  6 each Topical Daily Erin Fulling, MD   6 each at 02/24/23 0815   clonazePAM (KLONOPIN) tablet 1 mg  1 mg Oral BID PRN Erin Fulling, MD   1 mg at 02/24/23 1521   docusate sodium (COLACE) capsule 100 mg  100 mg Oral BID PRN Jimmye Norman, NP       enoxaparin (LOVENOX) injection 50 mg  0.5 mg/kg Subcutaneous Q24H Hunt, Madison H, RPH   50 mg at 02/23/23 2214   escitalopram (LEXAPRO) tablet 10 mg  10 mg Oral Daily Erin Fulling, MD   10 mg at 02/24/23 1257   fentaNYL (DURAGESIC) 50 MCG/HR 1 patch  1 patch Transdermal Q72H Keyarah Mcroy, Daryl Eastern, NP   1 patch at 02/24/23 1745   gabapentin (NEURONTIN) capsule 100 mg  100 mg Oral TID Erin Fulling, MD   100 mg at 02/24/23 1517   HYDROmorphone (DILAUDID) tablet 1-2 mg  1-2 mg Oral Q6H PRN Dacota Ruben, Daryl Eastern, NP   2 mg at 02/24/23 1726   ipratropium-albuterol (DUONEB) 0.5-2.5 (3) MG/3ML nebulizer solution 3 mL  3 mL Nebulization Q6H Jimmye Norman, NP   3 mL at 02/24/23 1327   ipratropium-albuterol (DUONEB) 0.5-2.5 (3) MG/3ML nebulizer solution 3 mL  3 mL Nebulization Q6H PRN Jimmye Norman, NP       Melene Muller ON 02/25/2023] levothyroxine (SYNTHROID) tablet 250 mcg  250 mcg Oral QAC breakfast Erin Fulling, MD       methylPREDNISolone  sodium succinate (SOLU-MEDROL) 40 mg/mL injection 40 mg  40 mg Intravenous Daily Jimmye Norman, NP   40 mg at 02/24/23 0815   [START ON 02/25/2023] pantoprazole (PROTONIX) EC tablet 40 mg  40 mg Oral Daily Tressie Ellis, RPH       polyethylene glycol (MIRALAX / GLYCOLAX) packet 17 g  17 g Oral Daily PRN Jimmye Norman, NP        VITAL SIGNS: BP (!) 99/53 (BP Location: Left Arm)   Pulse 79   Temp 98.2 F (36.8 C) (Oral)   Resp 19   Ht 5\' 2"  (1.575 m)   Wt 208 lb 5.4 oz (94.5 kg)   SpO2 95%   BMI 38.10 kg/m  Filed Weights   02/23/23 1917 02/23/23 2315 02/24/23 0425  Weight: 223 lb 9.6 oz (101.4 kg) 208 lb 5.4 oz (94.5  kg) 208 lb 5.4 oz (94.5 kg)    Estimated body mass index is 38.1 kg/m as calculated from the following:   Height as of this encounter: 5\' 2"  (1.575 m).   Weight as of this encounter: 208 lb 5.4 oz (94.5 kg).  LABS: CBC:    Component Value Date/Time   WBC 2.6 (L) 02/24/2023 0252   HGB 7.8 (L) 02/24/2023 0252   HGB 7.4 (L) 02/10/2023 2009   HCT 24.1 (L) 02/24/2023 0252   HCT 38.5 07/02/2011 1529   PLT 220 02/24/2023 0252   PLT 311 07/02/2011 1529   MCV 94.9 02/24/2023 0252   MCV 90 07/02/2011 1529   NEUTROABS 4.3 02/23/2023 1921   LYMPHSABS 0.4 (L) 02/23/2023 1921   MONOABS 0.1 02/23/2023 1921   EOSABS 0.0 02/23/2023 1921   BASOSABS 0.0 02/23/2023 1921   Comprehensive Metabolic Panel:    Component Value Date/Time   NA 136 02/24/2023 0252   K 3.9 02/24/2023 0252   CL 102 02/24/2023 0252   CO2 25 02/24/2023 0252   BUN 18 02/24/2023 0252   CREATININE 1.46 (H) 02/24/2023 0252   GLUCOSE 137 (H) 02/24/2023 0252   CALCIUM 8.6 (L) 02/24/2023 0252   AST 13 (L) 02/23/2023 1921   ALT 9 02/23/2023 1921   ALKPHOS 71 02/23/2023 1921   BILITOT 0.4 02/23/2023 1921   PROT 8.1 02/23/2023 1921   ALBUMIN 2.2 (L) 02/23/2023 1921    RADIOGRAPHIC STUDIES: US THORACENTESIS ASP PLEURAL SPACE W/IMG GUIDE  Result Date: 02/24/2023 INDICATION:  57 year old female. 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 Admitted for altered mental status. Found to have a left-sided pleural effusion. Team is requesting a thoracentesis for further evaluation EXAM: ULTRASOUND GUIDED LEFT-SIDED THERAPEUTIC AND DIAGNOSTIC THORACENTESIS MEDICATIONS: Lidocaine 1% 10 mL COMPLICATIONS: None immediate. PROCEDURE: An ultrasound guided thoracentesis was thoroughly discussed with the patient and questions answered. The benefits, risks, alternatives and complications were also discussed. The patient understands and wishes to proceed with the procedure. Written consent was obtained. Ultrasound was performed to localize and mark an adequate pocket of fluid in the left chest. The area was then prepped and draped in the normal sterile fashion. 1% Lidocaine was used for local anesthesia. Under ultrasound guidance a 6 Fr Safe-T-Centesis catheter was introduced. Thoracentesis was performed. The catheter was removed and a dressing applied. FINDINGS: A total of approximately 400 mL of serosanguineous fluid was removed. Samples were sent to the laboratory as requested by the clinical team. IMPRESSION: Successful ultrasound guided left-sided therapeutic thoracentesis yielding of pleural fluid. Performed by: Anders Grant, NP Electronically Signed   By: Olive Bass M.D.   On: 02/24/2023 16:21   CT Angio Chest PE W/Cm &/Or Wo Cm  Result Date: 02/23/2023 CLINICAL DATA:  Pulmonary embolism (PE) suspected, high prob l sided opacity on CXR EXAM: CT ANGIOGRAPHY CHEST WITH CONTRAST TECHNIQUE: Multidetector CT imaging of the chest was performed using the standard protocol during bolus administration of intravenous contrast. Multiplanar CT image reconstructions and MIPs were obtained to evaluate the vascular anatomy. RADIATION DOSE REDUCTION: This exam was performed according to the departmental dose-optimization program which includes automated  exposure control, adjustment of the mA and/or kV according to patient size and/or use of iterative reconstruction technique. CONTRAST:  75mL OMNIPAQUE IOHEXOL 350 MG/ML SOLN COMPARISON:  CT chest in 01/24/2023, PET CT 02/12/2023 FINDINGS: Cardiovascular: Poor opacification of the pulmonary arteries due to timing of contrast. No evidence of central pulmonary embolism. The main pulmonary artery  is enlarged in caliber measuring up to 3.6 cm. Normal heart size. No significant pericardial effusion. The thoracic aorta is normal in caliber. Mild atherosclerotic plaque of the thoracic aorta. Four-vessel coronary artery calcifications. Mediastinum/Nodes: Interval development of an enlarged right hilar lymph node measuring to 1.8 cm. No enlarged mediastinal, left hilar, or axillary lymph nodes. Thyroid gland, trachea, and esophagus demonstrate no significant findings. Lungs/Pleura: Partial collapse of left lower lobe. Interval worsening of patchy airspace opacities of the left lower lobe and lingula. Interval increase in size and number right upper lobe cluster of spiculated nodularities with nodule measuring up to 1.3 cm. Persistent scattered pulmonary nodules within the right lower lobe. Similar findings within the right middle lobe. No pulmonary mass. Interval increase in small to moderate volume left pleural effusion . No pneumothorax. Upper Abdomen: Stable 2.8 cm right hepatic hypodensity. Gastric surgical changes with small hiatal hernia. Musculoskeletal: No chest wall abnormality. No acute displaced fracture. T4-T9 posterolateral surgical hardware in the setting of a lytic lesion involving the majority of the T6-T7 levels. Associated similar-appearing 9 x 4.5 cm (5:64) soft tissue mass along the left paraspinal resection bed. Redemonstration of resorption of the left posterior seventh rib. Review of the MIP images confirms the above findings. IMPRESSION: 1. Poor opacification of the pulmonary arteries due to timing of  contrast. No central pulmonary embolus. Limited evaluation more distally due to timing of contrast. 2. Enlarged main pulmonary artery-correlate for pulmonary hypertension. 3. Partial collapse of left lower lobe. Interval worsening of patchy airspace opacities of the left lower lobe and lingula. Findings may represent a combination of atelectasis as well as infection/inflammation. 4. Interval increase in small to moderate volume left pleural effusion . 5. Interval increase in size and number of indeterminate scattered pulmonary nodules within the right lung with largest measuring up to 1.3 cm in the upper lobes. 6. Interval development of an enlarged right hilar lymph node measuring to 1.8 cm. Findings likely reactive in etiology. Recommend attention on follow-up. 7. Redemonstration of lytic lesion involving the majority of the T6-T7 levels with associated similar-appearing 9 x 4.5 cm soft tissue mass along the left paraspinal resection bed. Redemonstration of resorption of the left posterior seventh rib. 8.  Aortic Atherosclerosis (ICD10-I70.0). Electronically Signed   By: Tish Frederickson M.D.   On: 02/23/2023 22:42   CT HEAD WO CONTRAST ( )  Result Date: 02/23/2023 CLINICAL DATA:  Altered mental status EXAM: CT HEAD WITHOUT CONTRAST TECHNIQUE: Contiguous axial images were obtained from the base of the skull through the vertex without intravenous contrast. RADIATION DOSE REDUCTION: This exam was performed according to the departmental dose-optimization program which includes automated exposure control, adjustment of the mA and/or kV according to patient size and/or use of iterative reconstruction technique. COMPARISON:  02/03/2023 FINDINGS: Brain: There is no mass, hemorrhage or extra-axial collection. The size and configuration of the ventricles and extra-axial CSF spaces are normal. The brain parenchyma is normal, without acute or chronic infarction. Vascular: No abnormal hyperdensity of the major  intracranial arteries or dural venous sinuses. No intracranial atherosclerosis. Skull: The visualized skull base, calvarium and extracranial soft tissues are normal. Sinuses/Orbits: No fluid levels or advanced mucosal thickening of the visualized paranasal sinuses. No mastoid or middle ear effusion. The orbits are normal. IMPRESSION: Normal head CT. Electronically Signed   By: Deatra Robinson M.D.   On: 02/23/2023 22:30   DG Chest Port 1 View  Result Date: 02/23/2023 CLINICAL DATA:  Shortness of breath EXAM: PORTABLE CHEST 1 VIEW  COMPARISON:  02/18/2023 FINDINGS: Stable cardiomegaly. Moderate left pleural effusion with left basilar opacity. Increasing opacity in the left upper lobe. Interstitial prominence in the right lung. No right-sided pleural effusion. No pneumothorax. IMPRESSION: 1. Moderate left pleural effusion with left basilar opacity, which may represent atelectasis or pneumonia. Increasing opacity in the left upper lobe. 2. Interstitial prominence in the right lung, which may reflect edema. Electronically Signed   By: Duanne Guess D.O.   On: 02/23/2023 20:18   DG Chest Port 1 View  Result Date: 02/18/2023 CLINICAL DATA:  History of multiple myeloma and pulmonary embolus. EXAM: PORTABLE CHEST 1 VIEW COMPARISON:  February 16, 2023. FINDINGS: Stable cardiomegaly. Continued presence of wedge-shaped consolidation within the left midlung concerning for pneumonia or infarction as noted on prior exam. Mild central pulmonary vascular congestion is noted. Left basilar atelectasis or infiltrate and effusion cannot be excluded. Postsurgical changes are seen in thoracic spine. IMPRESSION: Continued presence wedge-shaped consolidation with left midlung concerning for pneumonia or infarction as noted on prior exam. Mild central pulmonary vascular congestion. Left basilar atelectasis or infiltrate and pleural effusion cannot be excluded. Electronically Signed   By: Lupita Raider M.D.   On: 02/18/2023 12:43    NM Pulmonary Perfusion  Result Date: 02/18/2023 CLINICAL DATA:  Concern for pulmonary embolism.  Short of breath. EXAM: NUCLEAR MEDICINE PERFUSION LUNG SCAN TECHNIQUE: Perfusion images were obtained in multiple projections after intravenous injection of radiopharmaceutical. RADIOPHARMACEUTICALS:  4.5 mCi Tc-67m MAA COMPARISON:  Chest radiograph 02/18/2023 FINDINGS: Marked decreased relative perfusion to the entire LEFT lung is favored related to attenuation artifact from large LEFT effusion seen on comparison radiograph. No wedge-shaped peripheral perfusion defects within LEFT or RIGHT lung to suggest acute pulmonary embolism. IMPRESSION: 1. No evidence acute pulmonary embolism. 2. Decreased relative activity in the LEFT lung is favor related to large LEFT effusion. Electronically Signed   By: Genevive Bi M.D.   On: 02/18/2023 12:40   DG Chest Port 1 View  Result Date: 02/16/2023 CLINICAL DATA:  Fever of unknown origin, history of multiple myeloma EXAM: PORTABLE CHEST 1 VIEW COMPARISON:  02/10/2023, 02/12/2023 FINDINGS: Single frontal view of the chest demonstrates stable enlargement the cardiac silhouette. Chronic vascular congestion. Nodular areas of consolidation are again seen bilaterally, unchanged since recent PET scan. There is a new peripheral area of wedge-shaped consolidation within the left midlung, measuring up to 6.3 cm. Small left pleural effusion. No pneumothorax. Postsurgical changes from thoracic fusion. Destructive lesions of the midthoracic spine are not well visualized on this x-ray. IMPRESSION: 1. New peripheral area of wedge-shaped consolidation within the left mid lung, most consistent with focal pneumonia or pulmonary infarct given rapid development since recent PET CT. 2. Small left pleural effusion. 3. Nodular areas of consolidation bilaterally, which may be inflammatory or infectious. No change since recent PET scan. 4. Chronic vascular congestion. Electronically Signed   By:  Sharlet Salina M.D.   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 EXAM: NUCLEAR MEDICINE PET WHOLE BODY TECHNIQUE: 10.1 mCi F-18 FDG was injected intravenously. Full-ring PET imaging was performed from the head to foot after the radiotracer. CT data was obtained and used for attenuation correction and anatomic localization. Fasting blood glucose: 100 mg/dl COMPARISON:  CT chest 21/30/8657 FINDINGS: Mediastinal blood pool activity: SUV max HEAD/NECK: No hypermetabolic activity in the scalp.  No hypermetabolic cervical lymph nodes. Incidental CT findings: none CHEST: At the LEFT paraspinal resection site (T6-T7), there is residual fluid and soft tissue density tissue measuring 6.1 x 3.7 cm. This tissue has no significant metabolic activity. Activity is equal to background blood pool activity. Posterior fusion at this level. No hypermetabolic tissue within the thorax to suggest malignancy within the bones or soft tissue. There are several small nodules in the LEFT lung apex (image 54/6) without metabolic activity. These are new from comparison CT 01/24/2023 and therefore favored foci of infection or inflammation. Small bilateral pleural effusions are present greater on the LEFT. Incidental CT findings: none ABDOMEN/PELVIS: There is moderate metabolic activity through the GE junction. There is postsurgical anatomy of the proximal stomach. No abnormal metabolic activity liver. Scattered physiologic activity in the bowel. No hypermetabolic lymph nodes in the abdomen pelvis. Incidental CT findings: Foley catheter in bladder. SKELETON: Hypermetabolic lesion in the L4 vertebral body with SUV max equal 5.4. associated lytic lesion measuring 19 mm on image 121/series 6. EXTREMITIES: No abnormal hypermetabolic activity in the lower extremities.  Incidental CT findings: No sclerotic or lytic lesions on CT imaging. IMPRESSION: 1. Solitary hypermetabolic lytic lesion in the L4 vertebral body consistent multiple myeloma. Metabolic activity is moderate. 2. Residual soft tissue density at the LEFT paraspinal resection site (T6-T7) with no significant metabolic activity. Favor benign postsurgical tissue. 3. Small bilateral pleural effusions greater on the LEFT. 4. Small nodules in the LEFT lung apex without metabolic activity are favored foci of infection or inflammation. 5. Moderate metabolic activity through the GE junction is favored physiologic or inflammatory. Electronically Signed   By: Genevive Bi M.D.   On: 02/12/2023 13:47   US RENAL  Result Date: 02/10/2023 CLINICAL DATA:  AKI EXAM: RENAL / URINARY TRACT ULTRASOUND COMPLETE COMPARISON:  None Available. FINDINGS: Right Kidney: Renal measurements: 12.5 cm x 5.6 cm x 4.6 cm = volume: 165 mL. Parenchymal echogenicity is normal. There is no hydronephrosis. Left Kidney: Renal measurements: 12.6 cm x 6.4 cm x 5.0 cm = volume: 213 mL. Parenchymal echogenicity is normal. There is no hydronephrosis. Bladder: Decompressed by a Foley catheter. Other: None. IMPRESSION: Unremarkable renal ultrasound. Electronically Signed   By: Lesia Hausen M.D.   On: 02/10/2023 17:03   DG Chest 1 View  Result Date: 02/10/2023 CLINICAL DATA:  Acute kidney injury. Sodium 120. Elevated creatinine. EXAM: CHEST  1 VIEW COMPARISON:  02/01/2023 FINDINGS: Shallow inspiration. Cardiac enlargement. Hazy interstitial infiltrates throughout both lungs most likely representing edema although possibly interstitial pneumonia or chronic fibrosis. Small left pleural effusion with basilar atelectasis or consolidation. This could indicate pneumonia in the appropriate clinical setting. No pneumothorax. Postoperative changes in the thoracic spine. No significant change since prior study. IMPRESSION: Cardiac enlargement with probable  interstitial pulmonary edema. Small left pleural effusion with basilar atelectasis or consolidation. Similar appearance to previous study. Electronically Signed   By: Burman Nieves M.D.   On: 02/10/2023 15:50   IR BONE MARROW BIOPSY & ASPIRATION  Result Date: 02/05/2023 INDICATION: Hematologic abnormality EXAM: Bone marrow aspiration and core biopsy using fluoroscopic guidance MEDICATIONS: None. ANESTHESIA/SEDATION: Moderate (conscious) sedation was employed during this procedure. A total of Versed 1.5 mg and Fentanyl 75 mcg was administered intravenously. Moderate Sedation Time: 10 minutes. The patient's level of consciousness and vital signs were monitored continuously by radiology nursing throughout the procedure under my direct supervision. FLUOROSCOPY TIME:  Fluoroscopy Time: 0.7 minutes (13 mGy) COMPLICATIONS: None immediate. PROCEDURE: Informed written consent was  obtained from the patient after a thorough discussion of the procedural risks, benefits and alternatives. All questions were addressed. Maximal Sterile Barrier Technique was utilized including caps, mask, sterile gowns, sterile gloves, sterile drape, hand hygiene and skin antiseptic. A timeout was performed prior to the initiation of the procedure. The patient was placed prone on the exam table. Limited fluoroscopy of the pelvis was performed for planning purposes. Skin entry site was marked, and the overlying skin was prepped and draped in the standard sterile fashion. Local analgesia was obtained with 1% lidocaine. Using fluoroscopic guidance, an 11 gauge needle was advanced just deep to the cortex of the right posterior ilium. Subsequently, bone marrow aspiration and core biopsy were performed. Specimens were submitted to lab/pathology for handling. Hemostasis was achieved with manual pressure, and a clean dressing was placed. The patient tolerated the procedure well without immediate complication. IMPRESSION: Successful bone marrow  aspiration and core biopsy of the right posterior ilium. Electronically Signed   By: Olive Bass M.D.   On: 02/05/2023 15:35   CT HEAD WO CONTRAST ( )  Result Date: 02/03/2023 CLINICAL DATA:  Mental status change, unknown cause. Acute hypoxic respiratory failure EXAM: CT HEAD WITHOUT CONTRAST TECHNIQUE: Contiguous axial images were obtained from the base of the skull through the vertex without intravenous contrast. RADIATION DOSE REDUCTION: This exam was performed according to the departmental dose-optimization program which includes automated exposure control, adjustment of the mA and/or kV according to patient size and/or use of iterative reconstruction technique. COMPARISON:  Bone survey 10/09/2022. FINDINGS: Brain: No acute intracranial abnormality. Specifically, no hemorrhage, hydrocephalus, mass lesion, acute infarction, or significant intracranial injury. Vascular: No hyperdense vessel or unexpected calcification. Skull: Focal lucent lesions throughout the skull. 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 These lytic lesions were not in the skull on the skull x-ray from 10/09/2022. Appearance is concerning for possible multiple myeloma. No fracture. Sinuses/Orbits: No acute findings Other: None IMPRESSION: No acute intracranial abnormality. Numerous scattered focal lucent lesions throughout the skull. These are new since prior bone survey from 10/09/2022 and concerning for multiple myeloma. Electronically Signed   By: Charlett Nose M.D.   On: 02/03/2023 16:30   DG Chest Port 1 View  Result Date: 02/01/2023 CLINICAL DATA:  Hypoxia EXAM: PORTABLE CHEST 1 VIEW COMPARISON:  01/30/2023 FINDINGS: Bilateral diffuse interstitial thickening and patchy alveolar airspace opacities at the right lung base and left mid lower lung. No pleural effusion or pneumothorax. Stable cardiomediastinal silhouette. No acute osseous abnormality. Posterior spinal fusion of the upper and  midthoracic spine. IMPRESSION: 1. Bilateral diffuse interstitial thickening and patchy alveolar airspace opacities at the right lung base and left mid lower lung concerning for pulmonary edema. Electronically Signed   By: Elige Ko M.D.   On: 02/01/2023 10:13   ECHOCARDIOGRAM COMPLETE  Result Date: 01/31/2023    ECHOCARDIOGRAM REPORT   Patient Name:   MISHON GETTING Date of Exam: 01/31/2023 Medical Rec #:  [id number]      Height:       62.0 in Accession #:    [id number]     Weight:       170.0 lb Date of Birth:  26-Oct-1965      BSA:          1.784 m Patient Age:    57 years       BP:           106/57 mmHg Patient Gender: F  HR:           110 bpm. Exam Location:  [facility] Procedure: 2D Echo, Cardiac Doppler and Color Doppler Indications:     Dyspnea  History:         Patient has no prior history of Echocardiogram examinations.                  Signs/Symptoms:Dyspnea, Chest Pain, Fatigue and Shortness of                  Breath; Risk Factors:Hypertension, Sleep Apnea and Current                  Smoker.  Sonographer:     Mikki Harbor Referring Phys:  [id number] Alford Highland Diagnosing Phys: Julien Nordmann MD  Sonographer Comments: Image acquisition challenging due to respiratory motion. IMPRESSIONS  1. Left ventricular ejection fraction, by estimation, is 60 to 65%. The left ventricle has normal function. The left ventricle has no regional wall motion abnormalities. There is mild left ventricular hypertrophy. Left ventricular diastolic parameters are consistent with Grade I diastolic dysfunction (impaired relaxation).  2. Right ventricular systolic function is normal. The right ventricular size is normal. There is normal pulmonary artery systolic pressure. The estimated right ventricular systolic pressure is 30.9 mmHg.  3. Left atrial size was mildly dilated.  4. The mitral valve is normal in structure. No evidence of mitral valve regurgitation. No evidence of mitral stenosis.  5. The aortic valve is  tricuspid. Aortic valve regurgitation is not visualized. No aortic stenosis is present.  6. The inferior vena cava is normal in size with greater than 50% respiratory variability, suggesting right atrial pressure of 3 mmHg. FINDINGS  Left Ventricle: Left ventricular ejection fraction, by estimation, is 60 to 65%. The left ventricle has normal function. The left ventricle has no regional wall motion abnormalities. The left ventricular internal cavity size was normal in size. There is  mild left ventricular hypertrophy. Left ventricular diastolic parameters are consistent with Grade I diastolic dysfunction (impaired relaxation). Right Ventricle: The right ventricular size is normal. No increase in right ventricular wall thickness. Right ventricular systolic function is normal. There is normal pulmonary artery systolic pressure. The tricuspid regurgitant velocity is 2.64 m/s, and  with an assumed right atrial pressure of 3 mmHg, the estimated right ventricular systolic pressure is 30.9 mmHg. Left Atrium: Left atrial size was mildly dilated. Right Atrium: Right atrial size was normal in size. Pericardium: There is no evidence of pericardial effusion. Mitral Valve: The mitral valve is normal in structure. No evidence of mitral valve regurgitation. No evidence of mitral valve stenosis. MV peak gradient, 8.0 mmHg. The mean mitral valve gradient is 4.0 mmHg. Tricuspid Valve: The tricuspid valve is normal in structure. Tricuspid valve regurgitation is mild . No evidence of tricuspid stenosis. Aortic Valve: The aortic valve is tricuspid. Aortic valve regurgitation is not visualized. No aortic stenosis is present. Aortic valve mean gradient measures 9.0 mmHg. Aortic valve peak gradient measures 18.4 mmHg. Aortic valve area, by VTI measures 3.04  cm. Pulmonic Valve: The pulmonic valve was normal in structure. Pulmonic valve regurgitation is not visualized. No evidence of pulmonic stenosis. Aorta: The aortic root is normal in  size and structure. Venous: The inferior vena cava is normal in size with greater than 50% respiratory variability, suggesting right atrial pressure of 3 mmHg. IAS/Shunts: No atrial level shunt detected by color flow Doppler.  LEFT VENTRICLE PLAX 2D LVIDd:  5.80 cm   Diastology LVIDs:         3.60 cm   LV e' medial:    9.36 cm/s LV PW:         0.90 cm   LV E/e' medial:  9.7 LV IVS:        1.00 cm   LV e' lateral:   10.00 cm/s LVOT diam:     2.20 cm   LV E/e' lateral: 9.1 LV SV:         99 LV SV Index:   55 LVOT Area:     3.80 cm  RIGHT VENTRICLE RV Basal diam:  3.45 cm RV Mid diam:    3.00 cm RV S prime:     27.40 cm/s TAPSE (M-mode): 3.3 cm LEFT ATRIUM             Index        RIGHT ATRIUM           Index LA diam:        4.80 cm 2.69 cm/m   RA Area:     18.90 cm LA Vol (A2C):   59.6 ml 33.41 ml/m  RA Volume:   54.10 ml  30.32 ml/m LA Vol (A4C):   66.9 ml 37.50 ml/m LA Biplane Vol: 65.0 ml 36.43 ml/m  AORTIC VALVE AV Area (Vmax):    3.09 cm AV Area (Vmean):   3.04 cm AV Area (VTI):     3.04 cm AV Vmax:           214.50 cm/s AV Vmean:          136.750 cm/s AV VTI:            0.326 m AV Peak Grad:      18.4 mmHg AV Mean Grad:      9.0 mmHg LVOT Vmax:         174.50 cm/s LVOT Vmean:        109.500 cm/s LVOT VTI:          0.260 m LVOT/AV VTI ratio: 0.80  AORTA Ao Root diam: 3.20 cm MITRAL VALVE                TRICUSPID VALVE MV Area (PHT): 3.72 cm     TR Peak grad:   27.9 mmHg MV Area VTI:   2.90 cm     TR Vmax:        264.00 cm/s MV Peak grad:  8.0 mmHg MV Mean grad:  4.0 mmHg     SHUNTS MV Vmax:       1.41 m/s     Systemic VTI:  0.26 m MV Vmean:      91.6 cm/s    Systemic Diam: 2.20 cm MV Decel Time: 204 msec MV E velocity: 90.70 cm/s MV A velocity: 117.00 cm/s MV E/A ratio:  0.78 Julien Nordmann MD Electronically signed by Julien Nordmann MD Signature Date/Time: 01/31/2023/5:43:31 PM    Final    DG Thoracic Spine 2 View  Result Date: 01/31/2023 CLINICAL DATA:  Back pain. 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 EXAM: THORACIC SPINE 2 VIEWS COMPARISON:  MRI of the cervicothoracic spine 01/24/2023. Intraoperative imaging of the thoracic spine 01/28/2023. Chest CTA 01/24/2023. 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 Counting superiorly from the T12 level, the patient has undergone midthoracic spinal decompression with posterior pedicle screw and rod fusion from T4 through T9. The hardware  appears intact and well positioned. Chronic superior endplate compression deformity at T11 is unchanged. No acute osseous complications are identified. There is mild atelectasis at both lung bases. IMPRESSION: No demonstrated acute findings status post recent midthoracic spinal decompression and fusion. Electronically Signed   By: Carey Bullocks M.D.   On: 01/31/2023 16:27   DG Chest Port 1 View  Result Date: 01/30/2023 CLINICAL DATA:  Cough, shortness of breath EXAM: PORTABLE CHEST 1 VIEW COMPARISON:  01/24/2023 FINDINGS: Cardiomegaly. Diffuse bilateral interstitial pulmonary opacity and trace pleural effusions. Interval thoracic fusion. IMPRESSION: Cardiomegaly with diffuse bilateral interstitial pulmonary opacity and trace pleural effusions, most consistent with edema. Electronically Signed   By: Jearld Lesch M.D.   On: 01/30/2023 14:07   DG Thoracic Spine 2 View  Result Date: 01/28/2023 CLINICAL DATA:  Elective surgery. T4 through T9 fusion of biopsy for lesion with intra 3D imaging. EXAM: THORACIC SPINE 2 VIEWS COMPARISON:  Preoperative imaging. FINDINGS: Intraoperative 3D imaging of the thoracic spine obtained. Posterior rod with pedicle screws at multiple levels. Known paravertebral mass better assessed on preoperative imaging. No fluoroscopic spot views provided. Fluoroscopy time 1 minutes 30 seconds. Dose is 97.045 mGy IMPRESSION: Intraoperative 3D imaging of the thoracic spine. Electronically Signed   By: Narda Rutherford  M.D.   On: 01/28/2023 16:28   DG C-Arm 1-60 Min-No Report  Result Date: 01/28/2023 Fluoroscopy was utilized by the requesting physician.  No radiographic interpretation.   DG C-Arm 1-60 Min-No Report  Result Date: 01/28/2023 Fluoroscopy was utilized by the requesting physician.  No radiographic interpretation.   DG C-Arm 1-60 Min-No Report  Result Date: 01/28/2023 Fluoroscopy was utilized by the requesting physician.  No radiographic interpretation.   DG C-Arm 1-60 Min-No Report  Result Date: 01/28/2023 Fluoroscopy was utilized by the requesting physician.  No radiographic interpretation.    PERFORMANCE STATUS (ECOG) : 1 - Symptomatic but completely ambulatory  Review of Systems Unless otherwise noted, a complete review of systems is negative.  Physical Exam General: NAD Cardiovascular: regular rate and rhythm Pulmonary: clear ant fields Abdomen: soft, nontender, + bowel sounds GU: no suprapubic tenderness Extremities: no edema, no joint deformities Skin: no rashes Neurological: Weakness but otherwise nonfocal  IMPRESSION: Patient with multiple myeloma with recent surgery for paraspinal mass.,  Who was recently readmitted with AKI and now readmitted with pneumonia.  Patient known to me from her previous hospitalizations.  She is currently in ICU.  Respiratory status improved.  Patient tearful and anxious. She complains of ongoing back and skeletal pain.  She does have transdermal fentanyl patch applied from her recent hospitalization.  Patient states that she has not changed her fentanyl patch since discharge home from the hospital.  Will reorder home opioid regimen of transdermal fentanyl with oral hydromorphone as needed for breakthrough pain.  Patient remains in agreement with current scope of treatment.  She is a full code.  PLAN: -Continue current scope of treatment -Full code -Restart transdermal fentanyl and oral hydromorphone -Daily bowel regimen  Case and plan  discussed with Dr. Smith Robert  Time Total: 45 minutes  Visit consisted of counseling and education dealing with the complex and emotionally intense issues of symptom management and palliative care in the setting of serious and potentially life-threatening illness.Greater than 50%  of this time was spent counseling and coordinating care related to the above assessment and plan.  Signed by: Laurette Schimke, PhD, NP-C

## 2023-02-24 NOTE — Evaluation (Signed)
Physical Therapy Evaluation Patient Details Name: KENJA POLKINGHORNE MRN: [id number] DOB: 19-Oct-1965 Today's Date: 02/24/2023  History of Present Illness  Pt is a 57 y.o. female presenting to hospital 02/23/23 with c/o respiratory distress (sats in 70's and accessory muscle use).  Recent hospitalization for AKI and hyperkalemia (pt with chest pain and SOB).  Pt admitted with acute on chronic hypoxic and hypercapnic respiratory failure, acute exacerbation of COPD, B PNA, moderate L pleural effusion, sepsis, AMS, chronic pain syndrome, AKI.  Imaging showing "Redemonstration of lytic lesion involving the majority of the T6-T7 levels with associated similar-appearing 9 x 4.5 cm soft tissue mass along the left paraspinal resection bed. 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  Clinical Impression  Prior to hospital admission, pt was independent with functional mobility; lives alone; uses 2 L home O2 chronic.  0/10 pain reported during session.  Currently pt is modified independent with bed mobility; CGA with transfers; and CGA to ambulate short distance in room (bed to/from toilet).  Mild SOB noted with activity.  HR 99-104 bpm and SpO2 sats 94% or greater on 3 L O2 via nasal cannula during sessions activities.  Pt would currently benefit from skilled PT to address noted impairments and functional limitations (see below for any additional details).  Upon hospital discharge, no further therapy needs anticipated.     If plan is discharge home, recommend the following: A little help with walking and/or transfers;A little help with bathing/dressing/bathroom;Assist for transportation;Help with stairs or ramp for entrance   Can travel by private vehicle    Yes    Equipment Recommendations None recommended by PT   Recommendations for Other Services       Functional Status Assessment Patient has had a recent decline in their functional status and demonstrates the ability to make significant improvements in function in a reasonable and predictable amount of time.     Precautions / Restrictions Precautions Precautions: Fall;Back Precaution Comments: Pt reports TLSO (for prior recent back surgery) was only for comfort and she doesn't have to wear it (and has not worn it since she left the hospital) Restrictions Weight Bearing Restrictions: No      Mobility  Bed Mobility Overal bed mobility: Modified Independent Bed Mobility: Supine to Sit, Sit to Supine     Supine to sit: Modified independent (Device/Increase time), HOB elevated Sit to supine: Modified independent (Device/Increase time), HOB elevated        Transfers Overall transfer level: Needs assistance Equipment used: None Transfers: Sit to/from Stand Sit to Stand: Contact guard assist           General transfer comment: x1 trial from bed and x1 trial from toilet    Ambulation/Gait Ambulation/Gait assistance: Contact guard assist Gait Distance (Feet):  (8 feet x2 (bed to/from toilet)) Assistive device: None Gait Pattern/deviations: Decreased step length - right, Decreased step length - left Gait velocity: decreased     General Gait Details: steady ambulating without UE support  Stairs            Wheelchair Mobility     Tilt Bed    Modified Rankin (Stroke Patients Only)       Balance Overall balance assessment: Needs assistance Sitting-balance support: No upper extremity supported, Feet supported Sitting balance-Leahy Scale: Good Sitting balance - Comments: steady reaching within BOS   Standing balance support: During  functional activity, No upper extremity supported Standing balance-Leahy Scale: Good Standing balance comment: no loss of balance with short ambulation distance in room                              Pertinent Vitals/Pain Pain Assessment Pain Assessment: No/denies pain Pain Intervention(s): Limited activity within patient's tolerance, Monitored during session    Home Living Family/patient expects to be discharged to:: Private residence Living Arrangements: Alone Available Help at Discharge: Friend(s);Available PRN/intermittently;Neighbor Type of Home: Apartment Home Access: Level entry       Home Layout: One level Home Equipment: Agricultural consultant (2 wheels)      Prior Function Prior Level of Function : Independent/Modified Independent;Working/employed (Working part time)             Mobility Comments: Ambulated without AD use ADLs Comments: Independent in ADL's/IADL's     Extremity/Trunk Assessment   Upper Extremity Assessment Upper Extremity Assessment: Overall WFL for tasks assessed    Lower Extremity Assessment Lower Extremity Assessment: Generalized weakness    Cervical / Trunk Assessment Cervical / Trunk Assessment: Back Surgery  Communication   Communication Communication: No apparent difficulties Following commands: Follows multi-step commands with increased time Cueing Techniques: Verbal cues  Cognition Arousal: Alert Behavior During Therapy: WFL for tasks assessed/performed Overall Cognitive Status: Within Functional Limits for tasks assessed                                          General Comments  Nursing cleared pt for participation in physical therapy.  Pt agreeable to limited PT session.    Exercises     Assessment/Plan    PT Assessment Patient needs continued PT services  PT Problem List Decreased activity tolerance;Decreased mobility;Cardiopulmonary status limiting activity       PT Treatment Interventions DME instruction;Gait training;Stair training;Functional mobility training;Therapeutic activities;Therapeutic exercise;Balance training;Patient/family education    PT Goals (Current goals  can be found in the Care Plan section)  Acute Rehab PT Goals Patient Stated Goal: to go home PT Goal Formulation: With patient Time For Goal Achievement: 03/10/23 Potential to Achieve Goals: Good    Frequency Min 1X/week     Co-evaluation               AM-PAC PT "6 Clicks" Mobility  Outcome Measure Help needed turning from your back to your side while in a flat bed without using bedrails?: None Help needed moving from lying on your back to sitting on the side of a flat bed without using bedrails?: None Help needed moving to and from a bed to a chair (including a wheelchair)?: A Little Help needed standing up from a chair using your arms (e.g., wheelchair or bedside chair)?: A Little Help needed to walk in hospital room?: A Little Help needed climbing 3-5 steps with a railing? : A Little 6 Click Score: 20    End of Session Equipment Utilized During Treatment: Oxygen Activity Tolerance: Patient tolerated treatment well Patient left: in bed;with call bell/phone within reach Nurse Communication: Mobility status;Precautions PT Visit Diagnosis: Muscle weakness (generalized) (M62.81)    Time: 6295-2841 PT Time Calculation (min) (ACUTE ONLY): 14 min   Charges:   PT Evaluation $PT Eval Low Complexity: 1 Low   PT General Charges $$ ACUTE PT VISIT: 1 Visit        Irving Burton  Makynzee Tigges, PT 02/24/23, 5:03 PM

## 2023-02-24 NOTE — TOC Initial Note (Signed)
Transition of Care [facility]) - Initial/Assessment Note    Patient Details  Name: Paula Garrett MRN: [id number] Date of Birth: May 03, 1966  [facility]) CM/SW Contact:    Kreg Shropshire, RN Phone Number: 02/24/2023, 11:29 AM  Clinical Narrative:                 Cm spoke with pt and family at the bedside. Readmission Prevention screening completed.   Pt arrived from ED from: Home Caregiver Support: Brother, Sister, Son DME at Home: [facility] O2 Transportation: Family Previous Services: Declined HH during previous admission due to cost HH/SNF Preference: none First Person of Contact: Brother Alinda Money Ward PCP: Saralyn Pilar, DO  Cm will continue to follow for toc needs and d/c planning.     Barriers to Discharge: Continued Medical Work up   Patient Goals and CMS Choice   CMS Medicare.gov Compare Post Acute Care list provided to:: Patient Choice offered to / list presented to : Patient      Expected Discharge Plan and Services     Post Acute Care Choice: Durable Medical Equipment, Home Health Living arrangements for the past 2 months: Apartment                                      Prior Living Arrangements/Services Living arrangements for the past 2 months: Apartment Lives with:: Self                   Activities of Daily Living Home Assistive Devices/Equipment: None ADL Screening (condition at time of admission) Patient's cognitive ability adequate to safely complete daily activities?: No Is the patient deaf or have difficulty hearing?: No Does the patient have difficulty seeing, even when wearing glasses/contacts?: No Does the patient have difficulty concentrating, remembering, or making decisions?: No Patient able to express need for assistance with ADLs?: Yes Does the patient have difficulty dressing or bathing?: No Independently performs ADLs?: Yes (appropriate for developmental age) Does the patient have difficulty walking or  climbing stairs?: Yes Weakness of Legs: Both Weakness of Arms/Hands: None  Permission Sought/Granted                  Emotional Assessment       Orientation: : Oriented to Self, Oriented to Place, Oriented to  Time, Oriented to Situation      Admission diagnosis:  Acute on chronic respiratory failure with hypoxemia (HCC) [J96.21] Patient Active Problem List   Diagnosis Date Noted   Acute on chronic respiratory failure with hypoxemia (HCC) 02/23/2023   Shortness of breath 02/17/2023   Confusion 02/03/2023   Acute hypoxic respiratory failure (HCC) 01/31/2023   Fluid overload 01/30/2023   Hyponatremia 01/29/2023   AKI (acute kidney injury) (HCC) 01/29/2023   Acute blood loss anemia 01/29/2023   Spinal instability of 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/28/2023   Drug induced constipation 01/27/2023   Palliative care encounter 01/27/2023   Multiple myeloma (HCC) 01/25/2023   Spinal stenosis, 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   MDD (major depressive disorder), recurrent episode, moderate (HCC) 11/20/2022   SOB (shortness of breath) 11/01/2022   Chest pain 10/07/2022   Anxiety 10/07/2022   Iron deficiency anemia 12/14/2021   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   Goals of care, counseling/discussion 07/24/2020   Hypercalcemia 05/05/2020   Traumatic seroma of lower leg, right, subsequent encounter 05/05/2020   Traumatic hematoma of  lower leg, right, subsequent encounter 02/09/2020   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   Colon polyps 07/26/2019   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   Prediabetes 09/10/2018   OSA (obstructive sleep apnea) 01/09/2018   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   Allergic rhinitis 11/20/2017   Hot flashes 11/20/2017   Asthma 07/26/2017   B12 deficiency 07/26/2017   Snoring 05/01/2016   Rheumatoid factor positive 10/24/2015   Annual physical exam 01/12/2015   Chronic fatigue 06/23/2014   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    Barrett's esophagus without dysplasia 05/27/2014   History of adenomatous polyp of colon 05/27/2014   Anemia 01/28/2014   Morbid obesity (HCC) 01/28/2014   Low back pain 07/21/2013   Benign essential hypertension 01/20/2013   Vitamin D deficiency 12/02/2012   Chronic left shoulder pain 12/02/2012   Tobacco use disorder 06/09/2012   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   Insomnia 04/21/2012   Hypothyroidism 04/21/2012   Obesity (BMI 30-39.9) 04/21/2012   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   PCP:  Smitty Cords, DO Pharmacy:   [facility] [phone] - [place], [place] - [address] AT [facility] OF [address] [place] [address] [place] [address] Phone: [phone] Fax: [phone]     Social Determinants of Health (SDOH) Social History: SDOH Screenings   Food Insecurity: No Food Insecurity (02/10/2023)  Housing: Low Risk  (02/10/2023)  Transportation Needs: No Transportation Needs (02/10/2023)  Utilities: Not At Risk (02/10/2023)  Alcohol Screen: Low Risk  (01/01/2023)  Depression (PHQ2-9): High Risk (01/01/2023)  Financial Resource Strain: Low Risk  (11/01/2022)  Physical Activity: Insufficiently Active (11/01/2022)  Social Connections: Socially Isolated (11/01/2022)  Stress: Stress Concern Present (11/01/2022)  Tobacco Use: High Risk (02/10/2023)   SDOH Interventions:     Readmission Risk Interventions    02/24/2023   11:28 AM 02/12/2023    3:19 PM  Readmission Risk Prevention Plan  Transportation Screening Complete Complete  Medication Review (RN Care Manager) Referral to Pharmacy Complete  PCP or Specialist appointment within 3-5 days of discharge Complete   HRI or Home Care Consult Complete Complete  SW Recovery Care/Counseling Consult Complete Complete  Palliative Care Screening Not Applicable Complete  Skilled Nursing Facility Not Applicable Not Applicable

## 2023-02-24 NOTE — Procedures (Signed)
Ultrasound-guided diagnostic and therapeutic left sided thoracentesis performed yielding 400 milliliters of serosangious colored fluid. No immediate complications.   Diagnostic fluid was sent to the lab for further analysis. Follow-up chest x-ray pending. EBL is < 2 ml.

## 2023-02-24 NOTE — Group Note (Deleted)

## 2023-02-24 NOTE — Consult Note (Signed)
Hematology/Oncology Consult note [facility] [phone] [phone]  Patient Care Team: Smitty Cords, DO as PCP - General (Family Medicine) Lemar Livings, Merrily Pew, MD (General Surgery) Shelia Media, MD (Internal Medicine) Creig Hines, MD as Consulting Physician (Oncology)   Name of the patient: Paula Garrett  [id number]  Dec 04, 1965    Reason for consult: History of multiple myeloma admitted for acute on chronic hypoxic and hypercapnic respiratory failure   Requesting physician Dr. Belia Heman  Date of visit: 02/24/2023    History of presenting illness-patient is a 57 year old female with a history of newly diagnosed IgG lambda multiple myeloma s/p cycle 1 of cyclophosphamide Velcade dexamethasone chemotherapy which was given inpatient when she was admitted for acute kidney injury with a creatinine of 5 last week.  She was discharged 3 days ago on home oxygen for healthcare associated pneumonia on Augmentin.  She was readmitted to the hospital for altered mental status and was found to have acute on chronic hypoxic and hypercapnic respiratory failure requiring rescue BiPAP and presently in the ICU.  She is currently being treated for possible volume overload with Lasix and steroids for COPD exacerbation.    Review of systems- Review of Systems  Constitutional:  Positive for malaise/fatigue. Negative for chills, fever and weight loss.  HENT:  Negative for congestion, ear discharge and nosebleeds.   Eyes:  Negative for blurred vision.  Respiratory:  Positive for shortness of breath. Negative for cough, hemoptysis, sputum production and wheezing.   Cardiovascular:  Negative for chest pain, palpitations, orthopnea and claudication.  Gastrointestinal:  Negative for abdominal pain, blood in stool, constipation, diarrhea, heartburn, melena, nausea and vomiting.  Genitourinary:  Negative for dysuria, flank pain, frequency, hematuria and  urgency.  Musculoskeletal:  Negative for back pain, joint pain and myalgias.  Skin:  Negative for rash.  Neurological:  Negative for dizziness, tingling, focal weakness, seizures, weakness and headaches.  Endo/Heme/Allergies:  Does not bruise/bleed easily.  Psychiatric/Behavioral:  Negative for depression and suicidal ideas. The patient does not have insomnia.     Allergies  Allergen Reactions   Hctz [Hydrochlorothiazide]     Hyponatremia     Patient Active Problem List   Diagnosis Date Noted   Acute on chronic respiratory failure with hypoxemia (HCC) 02/23/2023   Shortness of breath 02/17/2023   Confusion 02/03/2023   Acute hypoxic respiratory failure (HCC) 01/31/2023   Fluid overload 01/30/2023   Hyponatremia 01/29/2023   AKI (acute kidney injury) (HCC) 01/29/2023   Acute blood loss anemia 01/29/2023   Spinal instability of 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/28/2023   Drug induced constipation 01/27/2023   Palliative care encounter 01/27/2023   Multiple myeloma (HCC) 01/25/2023   Spinal stenosis, 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   MDD (major depressive disorder), recurrent episode, moderate (HCC) 11/20/2022   SOB (shortness of breath) 11/01/2022   Chest pain 10/07/2022   Anxiety 10/07/2022   Iron deficiency anemia 12/14/2021   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   Goals of care, counseling/discussion 07/24/2020   Hypercalcemia 05/05/2020   Traumatic seroma of lower leg, right, subsequent encounter 05/05/2020   Traumatic hematoma of lower leg, right, subsequent encounter 02/09/2020   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   Colon polyps 07/26/2019   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   Prediabetes 09/10/2018   OSA (obstructive sleep apnea) 01/09/2018   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   Allergic rhinitis 11/20/2017   Hot flashes 11/20/2017   Asthma 07/26/2017   B12 deficiency 07/26/2017  Snoring 05/01/2016   Rheumatoid factor positive 10/24/2015   Annual  physical exam 01/12/2015   Chronic fatigue 06/23/2014   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   Barrett's esophagus without dysplasia 05/27/2014   History of adenomatous polyp of colon 05/27/2014   Anemia 01/28/2014   Morbid obesity (HCC) 01/28/2014   Low back pain 07/21/2013   Benign essential hypertension 01/20/2013   Vitamin D deficiency 12/02/2012   Chronic left shoulder pain 12/02/2012   Tobacco use disorder 06/09/2012   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   Insomnia 04/21/2012   Hypothyroidism 04/21/2012   Obesity (BMI 30-39.9) 04/21/2012   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     Past Medical History:  Diagnosis Date   Anxiety    Arthritis    Asthma    Barrett's esophagus 2015   COVID-19    03/10/20   Depression    GERD (gastroesophageal reflux disease)    Hypertension    Hypothyroidism    Pneumonia    Smoldering myeloma    Thyroid disease      Past Surgical History:  Procedure Laterality Date   ABLATION  2006   APPLICATION OF INTRAOPERATIVE CT SCAN N/A 01/28/2023   Procedure: APPLICATION OF INTRAOPERATIVE CT SCAN;  Surgeon: Loreen Freud, MD;  Location: [facility] ORS;  Service: Neurosurgery;  Laterality: N/A;   BREAST CYST ASPIRATION Left 1998   BREAST SURGERY     cyst aspiration   BREAST SURGERY  1998   cyst aspiration   COLONOSCOPY  03/2014   COLONOSCOPY WITH PROPOFOL N/A 01/06/2020   Procedure: COLONOSCOPY WITH PROPOFOL;  Surgeon: Toledo, Boykin Nearing, MD;  Location: [facility] ENDOSCOPY;  Service: Gastroenterology;  Laterality: N/A;   ENDOMETRIAL ABLATION     ESOPHAGOGASTRODUODENOSCOPY (EGD) WITH PROPOFOL N/A 08/15/2015   Procedure: ESOPHAGOGASTRODUODENOSCOPY (EGD) WITH PROPOFOL;  Surgeon: Christena Deem, MD;  Location[facility] ENDOSCOPY;  Service: Endoscopy;  Laterality: N/A;   ESOPHAGOGASTRODUODENOSCOPY (EGD) WITH PROPOFOL N/A 01/22/2016   Procedure: ESOPHAGOGASTRODUODENOSCOPY (EGD) WITH PROPOFOL;  Surgeon: Christena Deem, MD;  Location: [facility] ENDOSCOPY;   Service: Endoscopy;  Laterality: N/A;   ESOPHAGOGASTRODUODENOSCOPY (EGD) WITH PROPOFOL N/A 01/06/2020   Procedure: ESOPHAGOGASTRODUODENOSCOPY (EGD) WITH PROPOFOL;  Surgeon: Toledo, Boykin Nearing, MD;  Location: [facility] ENDOSCOPY;  Service: Gastroenterology;  Laterality: N/A;   GASTRIC BYPASS  2005   HERNIA REPAIR     IR BONE MARROW BIOPSY & ASPIRATION  02/05/2023   NASAL SINUS SURGERY     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   TOTAL THYROIDECTOMY     TUBAL LIGATION      Social History   Socioeconomic History   Marital status: Divorced    Spouse name: Not on file   Number of children: 2   Years of education: Not on file   Highest education level: GED or equivalent  Occupational History   Occupation: Part-time  Tobacco Use   Smoking status: Every Day    Current packs/day: 1.00    Average packs/day: 1 pack/day for 42.9 years (42.9 ttl pk-yrs)    Types: Cigarettes    Start date: 03/15/1980   Smokeless tobacco: Never  Vaping Use   Vaping status: Never Used  Substance and Sexual Activity   Alcohol use: Yes    Alcohol/week: 10.0 - 12.0 standard drinks of alcohol    Types: 8 - 10 Glasses of wine, 2 Standard drinks or equivalent per week   Drug use: No   Sexual activity: Yes    Partners: Male  Other Topics Concern   Not  on file  Social History Narrative   Lives in [place] single, divorced       Work - 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       Diet - healthy diet   Exercise - limited   Caffeine use: daily   Social Determinants of Health   Financial Resource Strain: Low Risk  (11/01/2022)   Overall Financial Resource Strain (CARDIA)    Difficulty of Paying Living Expenses: Not very hard  Food Insecurity: No Food Insecurity (02/10/2023)   Hunger Vital Sign    Worried About Running Out of Food in the Last Year: Never true    Ran Out of Food in the Last Year: Never true  Transportation Needs: No Transportation Needs (02/10/2023)   PRAPARE - Scientist, research (physical sciences) (Medical): No    Lack of Transportation (Non-Medical): No  Physical Activity: Insufficiently Active (11/01/2022)   Exercise Vital Sign    Days of Exercise per Week: 3 days    Minutes of Exercise per Session: 20 min  Stress: Stress Concern Present (11/01/2022)   [employer] of Occupational Health - Occupational Stress Questionnaire    Feeling of Stress : Very much  Social Connections: Socially Isolated (11/01/2022)   Social Connection and Isolation Panel [NHANES]    Frequency of Communication with Friends and Family: More than three times a week    Frequency of Social Gatherings with Friends and Family: Once a week    Attends Religious Services: Never    Database administrator or Organizations: No    Attends Engineer, structural: Not on file    Marital Status: Divorced  Intimate Partner Violence: Not At Risk (02/10/2023)   Humiliation, Afraid, Rape, and Kick questionnaire    Fear of Current or Ex-Partner: No    Emotionally Abused: No    Physically Abused: No    Sexually Abused: No     Family History  Problem Relation Age of Onset   Heart disease Mother    COPD Mother    Arthritis Mother    Cancer Mother        uterine   Lupus Mother    Anxiety disorder Mother    Depression Mother    Drug abuse Mother    COPD Father    Arthritis Father    Heart disease Father    Heart attack Father    Alcohol abuse Father    Heart disease Brother    Heart attack Brother    Drug abuse Brother    Anxiety disorder Brother    Depression Brother    Heart disease Brother    Cancer Brother        liver cancer age 31 y.o   Diabetes Maternal Grandmother    Diabetes Maternal Grandfather    Diabetes Paternal Grandmother    Diabetes Paternal Grandfather    Breast cancer Neg Hx      Current Facility-Administered Medications:    acyclovir (ZOVIRAX) 200 MG capsule 400 mg, 400 mg, Oral, BID, Kasa, Kurian, MD   allopurinol (ZYLOPRIM) tablet 100 mg, 100 mg, Oral,  Daily, Kasa, Kurian, MD, 100 mg at 02/24/23 1257   budesonide (PULMICORT) nebulizer solution 0.25 mg, 0.25 mg, Nebulization, BID, Ouma, Hubbard Hartshorn, NP, 0.25 mg at 02/24/23 0735   Chlorhexidine Gluconate Cloth 2 % PADS 6 each, 6 each, Topical, Daily, Erin Fulling, MD, 6 each at 02/24/23 0815   clonazePAM (KLONOPIN) tablet 1 mg, 1 mg, Oral, BID PRN, Kasa, Kurian,  MD, 1 mg at 02/24/23 1521   docusate sodium (COLACE) capsule 100 mg, 100 mg, Oral, BID PRN, Jimmye Norman, NP   enoxaparin (LOVENOX) injection 50 mg, 0.5 mg/kg, Subcutaneous, Q24H, Hunt, Madison H, RPH, 50 mg at 02/23/23 2214   escitalopram (LEXAPRO) tablet 10 mg, 10 mg, Oral, Daily, Kasa, Kurian, MD, 10 mg at 02/24/23 1257   fentaNYL (DURAGESIC) 50 MCG/HR 1 patch, 1 patch, Transdermal, Q72H, Borders, Daryl Eastern, NP, 1 patch at 02/24/23 1745   gabapentin (NEURONTIN) capsule 100 mg, 100 mg, Oral, TID, Belia Heman, Kurian, MD, 100 mg at 02/24/23 1517   HYDROmorphone (DILAUDID) tablet 1-2 mg, 1-2 mg, Oral, Q6H PRN, Borders, Daryl Eastern, NP, 2 mg at 02/24/23 1726   ipratropium-albuterol (DUONEB) 0.5-2.5 (3) MG/3ML nebulizer solution 3 mL, 3 mL, Nebulization, Q6H, Ouma, Hubbard Hartshorn, NP, 3 mL at 02/24/23 1327   ipratropium-albuterol (DUONEB) 0.5-2.5 (3) MG/3ML nebulizer solution 3 mL, 3 mL, Nebulization, Q6H PRN, Jimmye Norman, NP   [START ON 02/25/2023] levothyroxine (SYNTHROID) tablet 250 mcg, 250 mcg, Oral, QAC breakfast, Kasa, Kurian, MD   methylPREDNISolone sodium succinate (SOLU-MEDROL) 40 mg/mL injection 40 mg, 40 mg, Intravenous, Daily, Ouma, Hubbard Hartshorn, NP, 40 mg at 02/24/23 0815   [START ON 02/25/2023] pantoprazole (PROTONIX) EC tablet 40 mg, 40 mg, Oral, Daily, Chappell, Alex B, RPH   polyethylene glycol (MIRALAX / GLYCOLAX) packet 17 g, 17 g, Oral, Daily PRN, Jimmye Norman, NP   Physical exam:  Vitals:   02/24/23 1328 02/24/23 1400 02/24/23 1500 02/24/23 1600  BP:  (!) 112/51 (!) 103/54 (!) 99/53   Pulse:  97 87 79  Resp:  (!) 25 (!) 22 19  Temp:    98.2 F (36.8 C)  TempSrc:    Oral  SpO2: 96% 95% 95% 95%  Weight:      Height:       Physical Exam Constitutional:      Comments: On oxygen 3 L  Cardiovascular:     Rate and Rhythm: Normal rate and regular rhythm.     Heart sounds: Normal heart sounds.  Pulmonary:     Effort: Pulmonary effort is normal.     Comments: Scattered bilateral wheezing Abdominal:     General: Bowel sounds are normal.     Palpations: Abdomen is soft.  Skin:    General: Skin is warm and dry.  Neurological:     Mental Status: She is alert and oriented to person, place, and time.           Latest Ref Rng & Units 02/24/2023    2:52 AM  CMP  Glucose 70 - 99 mg/dL 034   BUN 6 - 20 mg/dL 18   Creatinine 7.42 - 1.00 mg/dL 5.95   Sodium 638 - 756 mmol/L 136   Potassium 3.5 - 5.1 mmol/L 3.9   Chloride 98 - 111 mmol/L 102   CO2 22 - 32 mmol/L 25   Calcium 8.9 - 10.3 mg/dL 8.6       Latest Ref Rng & Units 02/24/2023    2:52 AM  CBC  WBC 4.0 - 10.5 K/uL 2.6   Hemoglobin 12.0 - 15.0 g/dL 7.8   Hematocrit 43.3 - 46.0 % 24.1   Platelets 150 - 400 K/uL 220     @IMAGES @  US THORACENTESIS ASP PLEURAL SPACE W/IMG GUIDE  Result Date: 02/24/2023 INDICATION: 57 year old female. 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 Admitted for altered mental status. Found to have  a left-sided pleural effusion. Team is requesting a thoracentesis for further evaluation EXAM: ULTRASOUND GUIDED LEFT-SIDED THERAPEUTIC AND DIAGNOSTIC THORACENTESIS MEDICATIONS: Lidocaine 1% 10 mL COMPLICATIONS: None immediate. PROCEDURE: An ultrasound guided thoracentesis was thoroughly discussed with the patient and questions answered. The benefits, risks, alternatives and complications were also discussed. The patient understands and wishes to proceed with the procedure. Written consent was obtained. Ultrasound was performed to localize and mark an  adequate pocket of fluid in the left chest. The area was then prepped and draped in the normal sterile fashion. 1% Lidocaine was used for local anesthesia. Under ultrasound guidance a 6 Fr Safe-T-Centesis catheter was introduced. Thoracentesis was performed. The catheter was removed and a dressing applied. FINDINGS: A total of approximately 400 mL of serosanguineous fluid was removed. Samples were sent to the laboratory as requested by the clinical team. IMPRESSION: Successful ultrasound guided left-sided therapeutic thoracentesis yielding of pleural fluid. Performed by: Anders Grant, NP Electronically Signed   By: Olive Bass M.D.   On: 02/24/2023 16:21   CT Angio Chest PE W/Cm &/Or Wo Cm  Result Date: 02/23/2023 CLINICAL DATA:  Pulmonary embolism (PE) suspected, high prob l sided opacity on CXR EXAM: CT ANGIOGRAPHY CHEST WITH CONTRAST TECHNIQUE: Multidetector CT imaging of the chest was performed using the standard protocol during bolus administration of intravenous contrast. Multiplanar CT image reconstructions and MIPs were obtained to evaluate the vascular anatomy. RADIATION DOSE REDUCTION: This exam was performed according to the departmental dose-optimization program which includes automated exposure control, adjustment of the mA and/or kV according to patient size and/or use of iterative reconstruction technique. CONTRAST:  75mL OMNIPAQUE IOHEXOL 350 MG/ML SOLN COMPARISON:  CT chest in 01/24/2023, PET CT 02/12/2023 FINDINGS: Cardiovascular: Poor opacification of the pulmonary arteries due to timing of contrast. No evidence of central pulmonary embolism. The main pulmonary artery is enlarged in caliber measuring up to 3.6 cm. Normal heart size. No significant pericardial effusion. The thoracic aorta is normal in caliber. Mild atherosclerotic plaque of the thoracic aorta. Four-vessel coronary artery calcifications. Mediastinum/Nodes: Interval development of an enlarged right hilar lymph node  measuring to 1.8 cm. No enlarged mediastinal, left hilar, or axillary lymph nodes. Thyroid gland, trachea, and esophagus demonstrate no significant findings. Lungs/Pleura: Partial collapse of left lower lobe. Interval worsening of patchy airspace opacities of the left lower lobe and lingula. Interval increase in size and number right upper lobe cluster of spiculated nodularities with nodule measuring up to 1.3 cm. Persistent scattered pulmonary nodules within the right lower lobe. Similar findings within the right middle lobe. No pulmonary mass. Interval increase in small to moderate volume left pleural effusion . No pneumothorax. Upper Abdomen: Stable 2.8 cm right hepatic hypodensity. Gastric surgical changes with small hiatal hernia. Musculoskeletal: No chest wall abnormality. No acute displaced fracture. T4-T9 posterolateral surgical hardware in the setting of a lytic lesion involving the majority of the T6-T7 levels. Associated similar-appearing 9 x 4.5 cm (5:64) soft tissue mass along the left paraspinal resection bed. Redemonstration of resorption of the left posterior seventh rib. Review of the MIP images confirms the above findings. IMPRESSION: 1. Poor opacification of the pulmonary arteries due to timing of contrast. No central pulmonary embolus. Limited evaluation more distally due to timing of contrast. 2. Enlarged main pulmonary artery-correlate for pulmonary hypertension. 3. Partial collapse of left lower lobe. Interval worsening of patchy airspace opacities of the left lower lobe and lingula. Findings may represent a combination of atelectasis as well as infection/inflammation. 4. Interval increase in  small to moderate volume left pleural effusion . 5. Interval increase in size and number of indeterminate scattered pulmonary nodules within the right lung with largest measuring up to 1.3 cm in the upper lobes. 6. Interval development of an enlarged right hilar lymph node measuring to 1.8 cm. Findings  likely reactive in etiology. Recommend attention on follow-up. 7. Redemonstration of lytic lesion involving the majority of the T6-T7 levels with associated similar-appearing 9 x 4.5 cm soft tissue mass along the left paraspinal resection bed. Redemonstration of resorption of the left posterior seventh rib. 8.  Aortic Atherosclerosis (ICD10-I70.0). Electronically Signed   By: Tish Frederickson M.D.   On: 02/23/2023 22:42   CT HEAD WO CONTRAST ( )  Result Date: 02/23/2023 CLINICAL DATA:  Altered mental status EXAM: CT HEAD WITHOUT CONTRAST TECHNIQUE: Contiguous axial images were obtained from the base of the skull through the vertex without intravenous contrast. RADIATION DOSE REDUCTION: This exam was performed according to the departmental dose-optimization program which includes automated exposure control, adjustment of the mA and/or kV according to patient size and/or use of iterative reconstruction technique. COMPARISON:  02/03/2023 FINDINGS: Brain: There is no mass, hemorrhage or extra-axial collection. The size and configuration of the ventricles and extra-axial CSF spaces are normal. The brain parenchyma is normal, without acute or chronic infarction. Vascular: No abnormal hyperdensity of the major intracranial arteries or dural venous sinuses. No intracranial atherosclerosis. Skull: The visualized skull base, calvarium and extracranial soft tissues are normal. Sinuses/Orbits: No fluid levels or advanced mucosal thickening of the visualized paranasal sinuses. No mastoid or middle ear effusion. The orbits are normal. IMPRESSION: Normal head CT. Electronically Signed   By: Deatra Robinson M.D.   On: 02/23/2023 22:30   DG Chest Port 1 View  Result Date: 02/23/2023 CLINICAL DATA:  Shortness of breath EXAM: PORTABLE CHEST 1 VIEW COMPARISON:  02/18/2023 FINDINGS: Stable cardiomegaly. Moderate left pleural effusion with left basilar opacity. Increasing opacity in the left upper lobe. Interstitial prominence in  the right lung. No right-sided pleural effusion. No pneumothorax. IMPRESSION: 1. Moderate left pleural effusion with left basilar opacity, which may represent atelectasis or pneumonia. Increasing opacity in the left upper lobe. 2. Interstitial prominence in the right lung, which may reflect edema. Electronically Signed   By: Duanne Guess D.O.   On: 02/23/2023 20:18   DG Chest Port 1 View  Result Date: 02/18/2023 CLINICAL DATA:  History of multiple myeloma and pulmonary embolus. EXAM: PORTABLE CHEST 1 VIEW COMPARISON:  February 16, 2023. FINDINGS: Stable cardiomegaly. Continued presence of wedge-shaped consolidation within the left midlung concerning for pneumonia or infarction as noted on prior exam. Mild central pulmonary vascular congestion is noted. Left basilar atelectasis or infiltrate and effusion cannot be excluded. Postsurgical changes are seen in thoracic spine. IMPRESSION: Continued presence wedge-shaped consolidation with left midlung concerning for pneumonia or infarction as noted on prior exam. Mild central pulmonary vascular congestion. Left basilar atelectasis or infiltrate and pleural effusion cannot be excluded. Electronically Signed   By: Lupita Raider M.D.   On: 02/18/2023 12:43   NM Pulmonary Perfusion  Result Date: 02/18/2023 CLINICAL DATA:  Concern for pulmonary embolism.  Short of breath. EXAM: NUCLEAR MEDICINE PERFUSION LUNG SCAN TECHNIQUE: Perfusion images were obtained in multiple projections after intravenous injection of radiopharmaceutical. RADIOPHARMACEUTICALS:  4.5 mCi Tc-34m MAA COMPARISON:  Chest radiograph 02/18/2023 FINDINGS: Marked decreased relative perfusion to the entire LEFT lung is favored related to attenuation artifact from large LEFT effusion seen on comparison radiograph. No wedge-shaped  peripheral perfusion defects within LEFT or RIGHT lung to suggest acute pulmonary embolism. IMPRESSION: 1. No evidence acute pulmonary embolism. 2. Decreased relative activity  in the LEFT lung is favor related to large LEFT effusion. Electronically Signed   By: Genevive Bi M.D.   On: 02/18/2023 12:40   DG Chest Port 1 View  Result Date: 02/16/2023 CLINICAL DATA:  Fever of unknown origin, history of multiple myeloma EXAM: PORTABLE CHEST 1 VIEW COMPARISON:  02/10/2023, 02/12/2023 FINDINGS: Single frontal view of the chest demonstrates stable enlargement the cardiac silhouette. Chronic vascular congestion. Nodular areas of consolidation are again seen bilaterally, unchanged since recent PET scan. There is a new peripheral area of wedge-shaped consolidation within the left midlung, measuring up to 6.3 cm. Small left pleural effusion. No pneumothorax. Postsurgical changes from thoracic fusion. Destructive lesions of the midthoracic spine are not well visualized on this x-ray. IMPRESSION: 1. New peripheral area of wedge-shaped consolidation within the left mid lung, most consistent with focal pneumonia or pulmonary infarct given rapid development since recent PET CT. 2. Small left pleural effusion. 3. Nodular areas of consolidation bilaterally, which may be inflammatory or infectious. No change since recent PET scan. 4. Chronic vascular congestion. Electronically Signed   By: Sharlet Salina M.D.   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 EXAM: NUCLEAR MEDICINE PET WHOLE BODY TECHNIQUE: 10.1 mCi F-18 FDG was injected intravenously. Full-ring PET imaging was performed from the head to foot after the radiotracer. CT data was obtained and used for attenuation correction and anatomic localization. Fasting blood glucose: 100 mg/dl COMPARISON:  CT chest 21/30/8657 FINDINGS: Mediastinal blood pool activity: SUV max HEAD/NECK: No hypermetabolic activity in the scalp. No hypermetabolic cervical lymph nodes.  Incidental CT findings: none CHEST: At the LEFT paraspinal resection site (T6-T7), there is residual fluid and soft tissue density tissue measuring 6.1 x 3.7 cm. This tissue has no significant metabolic activity. Activity is equal to background blood pool activity. Posterior fusion at this level. No hypermetabolic tissue within the thorax to suggest malignancy within the bones or soft tissue. There are several small nodules in the LEFT lung apex (image 54/6) without metabolic activity. These are new from comparison CT 01/24/2023 and therefore favored foci of infection or inflammation. Small bilateral pleural effusions are present greater on the LEFT. Incidental CT findings: none ABDOMEN/PELVIS: There is moderate metabolic activity through the GE junction. There is postsurgical anatomy of the proximal stomach. No abnormal metabolic activity liver. Scattered physiologic activity in the bowel. No hypermetabolic lymph nodes in the abdomen pelvis. Incidental CT findings: Foley catheter in bladder. SKELETON: Hypermetabolic lesion in the L4 vertebral body with SUV max equal 5.4. associated lytic lesion measuring 19 mm on image 121/series 6. EXTREMITIES: No abnormal hypermetabolic activity in the lower extremities. Incidental CT findings: No sclerotic or lytic lesions on CT imaging. IMPRESSION: 1. Solitary hypermetabolic lytic lesion in the L4 vertebral body consistent multiple myeloma. Metabolic activity is moderate. 2. Residual soft tissue density at the LEFT paraspinal resection site (T6-T7) with no significant metabolic activity. Favor benign postsurgical tissue. 3. Small bilateral pleural effusions greater on the LEFT. 4. Small nodules in the LEFT lung apex without metabolic activity are favored foci of infection or inflammation. 5. Moderate metabolic activity through the GE junction is favored physiologic or inflammatory. Electronically Signed   By: Roseanne Reno  Amil Amen M.D.   On: 02/12/2023 13:47   US RENAL  Result  Date: 02/10/2023 CLINICAL DATA:  AKI EXAM: RENAL / URINARY TRACT ULTRASOUND COMPLETE COMPARISON:  None Available. FINDINGS: Right Kidney: Renal measurements: 12.5 cm x 5.6 cm x 4.6 cm = volume: 165 mL. Parenchymal echogenicity is normal. There is no hydronephrosis. Left Kidney: Renal measurements: 12.6 cm x 6.4 cm x 5.0 cm = volume: 213 mL. Parenchymal echogenicity is normal. There is no hydronephrosis. Bladder: Decompressed by a Foley catheter. Other: None. IMPRESSION: Unremarkable renal ultrasound. Electronically Signed   By: Lesia Hausen M.D.   On: 02/10/2023 17:03   DG Chest 1 View  Result Date: 02/10/2023 CLINICAL DATA:  Acute kidney injury. Sodium 120. Elevated creatinine. EXAM: CHEST  1 VIEW COMPARISON:  02/01/2023 FINDINGS: Shallow inspiration. Cardiac enlargement. Hazy interstitial infiltrates throughout both lungs most likely representing edema although possibly interstitial pneumonia or chronic fibrosis. Small left pleural effusion with basilar atelectasis or consolidation. This could indicate pneumonia in the appropriate clinical setting. No pneumothorax. Postoperative changes in the thoracic spine. No significant change since prior study. IMPRESSION: Cardiac enlargement with probable interstitial pulmonary edema. Small left pleural effusion with basilar atelectasis or consolidation. Similar appearance to previous study. Electronically Signed   By: Burman Nieves M.D.   On: 02/10/2023 15:50   IR BONE MARROW BIOPSY & ASPIRATION  Result Date: 02/05/2023 INDICATION: Hematologic abnormality EXAM: Bone marrow aspiration and core biopsy using fluoroscopic guidance MEDICATIONS: None. ANESTHESIA/SEDATION: Moderate (conscious) sedation was employed during this procedure. A total of Versed 1.5 mg and Fentanyl 75 mcg was administered intravenously. Moderate Sedation Time: 10 minutes. The patient's level of consciousness and vital signs were monitored continuously by radiology nursing throughout the  procedure under my direct supervision. FLUOROSCOPY TIME:  Fluoroscopy Time: 0.7 minutes (13 mGy) COMPLICATIONS: None immediate. PROCEDURE: Informed written consent was obtained from the patient after a thorough discussion of the procedural risks, benefits and alternatives. All questions were addressed. Maximal Sterile Barrier Technique was utilized including caps, mask, sterile gowns, sterile gloves, sterile drape, hand hygiene and skin antiseptic. A timeout was performed prior to the initiation of the procedure. The patient was placed prone on the exam table. Limited fluoroscopy of the pelvis was performed for planning purposes. Skin entry site was marked, and the overlying skin was prepped and draped in the standard sterile fashion. Local analgesia was obtained with 1% lidocaine. Using fluoroscopic guidance, an 11 gauge needle was advanced just deep to the cortex of the right posterior ilium. Subsequently, bone marrow aspiration and core biopsy were performed. Specimens were submitted to lab/pathology for handling. Hemostasis was achieved with manual pressure, and a clean dressing was placed. The patient tolerated the procedure well without immediate complication. IMPRESSION: Successful bone marrow aspiration and core biopsy of the right posterior ilium. Electronically Signed   By: Olive Bass M.D.   On: 02/05/2023 15:35   CT HEAD WO CONTRAST ( )  Result Date: 02/03/2023 CLINICAL DATA:  Mental status change, unknown cause. Acute hypoxic respiratory failure EXAM: CT HEAD WITHOUT CONTRAST TECHNIQUE: Contiguous axial images were obtained from the base of the skull through the vertex without intravenous contrast. RADIATION DOSE REDUCTION: This exam was performed according to the departmental dose-optimization program which includes automated exposure control, adjustment of the mA and/or kV according to patient size and/or use of iterative reconstruction technique. COMPARISON:  Bone survey 10/09/2022.  FINDINGS: Brain: No acute intracranial abnormality. Specifically, no hemorrhage, hydrocephalus, mass lesion, acute infarction, or significant intracranial injury. Vascular: No  hyperdense vessel or unexpected calcification. Skull: Focal lucent lesions throughout the skull. 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 These lytic lesions were not in the skull on the skull x-ray from 10/09/2022. Appearance is concerning for possible multiple myeloma. No fracture. Sinuses/Orbits: No acute findings Other: None IMPRESSION: No acute intracranial abnormality. Numerous scattered focal lucent lesions throughout the skull. These are new since prior bone survey from 10/09/2022 and concerning for multiple myeloma. Electronically Signed   By: Charlett Nose M.D.   On: 02/03/2023 16:30   DG Chest Port 1 View  Result Date: 02/01/2023 CLINICAL DATA:  Hypoxia EXAM: PORTABLE CHEST 1 VIEW COMPARISON:  01/30/2023 FINDINGS: Bilateral diffuse interstitial thickening and patchy alveolar airspace opacities at the right lung base and left mid lower lung. No pleural effusion or pneumothorax. Stable cardiomediastinal silhouette. No acute osseous abnormality. Posterior spinal fusion of the upper and midthoracic spine. IMPRESSION: 1. Bilateral diffuse interstitial thickening and patchy alveolar airspace opacities at the right lung base and left mid lower lung concerning for pulmonary edema. Electronically Signed   By: Elige Ko M.D.   On: 02/01/2023 10:13   ECHOCARDIOGRAM COMPLETE  Result Date: 01/31/2023    ECHOCARDIOGRAM REPORT   Patient Name:   SAMUEL WILDER Date of Exam: 01/31/2023 Medical Rec #:  [id number]      Height:       62.0 in Accession #:    [id number]     Weight:       170.0 lb Date of Birth:  [birth year]      BSA:          1.784 m Patient Age:    57 years       BP:           106/57 mmHg Patient Gender: F              HR:           110 bpm. Exam Location:  [facility] Procedure: 2D Echo, Cardiac Doppler  and Color Doppler Indications:     Dyspnea  History:         Patient has no prior history of Echocardiogram examinations.                  Signs/Symptoms:Dyspnea, Chest Pain, Fatigue and Shortness of                  Breath; Risk Factors:Hypertension, Sleep Apnea and Current                  Smoker.  Sonographer:     Mikki Harbor Referring Phys:  [id number] Alford Highland Diagnosing Phys: Julien Nordmann MD  Sonographer Comments: Image acquisition challenging due to respiratory motion. IMPRESSIONS  1. Left ventricular ejection fraction, by estimation, is 60 to 65%. The left ventricle has normal function. The left ventricle has no regional wall motion abnormalities. There is mild left ventricular hypertrophy. Left ventricular diastolic parameters are consistent with Grade I diastolic dysfunction (impaired relaxation).  2. Right ventricular systolic function is normal. The right ventricular size is normal. There is normal pulmonary artery systolic pressure. The estimated right ventricular systolic pressure is 30.9 mmHg.  3. Left atrial size was mildly dilated.  4. The mitral valve is normal in structure. No evidence of mitral valve regurgitation. No evidence of mitral stenosis.  5. The aortic valve is tricuspid. Aortic valve regurgitation is not visualized. No aortic stenosis is present.  6. The inferior vena cava is normal  in size with greater than 50% respiratory variability, suggesting right atrial pressure of 3 mmHg. FINDINGS  Left Ventricle: Left ventricular ejection fraction, by estimation, is 60 to 65%. The left ventricle has normal function. The left ventricle has no regional wall motion abnormalities. The left ventricular internal cavity size was normal in size. There is  mild left ventricular hypertrophy. Left ventricular diastolic parameters are consistent with Grade I diastolic dysfunction (impaired relaxation). Right Ventricle: The right ventricular size is normal. No increase in right ventricular wall  thickness. Right ventricular systolic function is normal. There is normal pulmonary artery systolic pressure. The tricuspid regurgitant velocity is 2.64 m/s, and  with an assumed right atrial pressure of 3 mmHg, the estimated right ventricular systolic pressure is 30.9 mmHg. Left Atrium: Left atrial size was mildly dilated. Right Atrium: Right atrial size was normal in size. Pericardium: There is no evidence of pericardial effusion. Mitral Valve: The mitral valve is normal in structure. No evidence of mitral valve regurgitation. No evidence of mitral valve stenosis. MV peak gradient, 8.0 mmHg. The mean mitral valve gradient is 4.0 mmHg. Tricuspid Valve: The tricuspid valve is normal in structure. Tricuspid valve regurgitation is mild . No evidence of tricuspid stenosis. Aortic Valve: The aortic valve is tricuspid. Aortic valve regurgitation is not visualized. No aortic stenosis is present. Aortic valve mean gradient measures 9.0 mmHg. Aortic valve peak gradient measures 18.4 mmHg. Aortic valve area, by VTI measures 3.04  cm. Pulmonic Valve: The pulmonic valve was normal in structure. Pulmonic valve regurgitation is not visualized. No evidence of pulmonic stenosis. Aorta: The aortic root is normal in size and structure. Venous: The inferior vena cava is normal in size with greater than 50% respiratory variability, suggesting right atrial pressure of 3 mmHg. IAS/Shunts: No atrial level shunt detected by color flow Doppler.  LEFT VENTRICLE PLAX 2D LVIDd:         5.80 cm   Diastology LVIDs:         3.60 cm   LV e' medial:    9.36 cm/s LV PW:         0.90 cm   LV E/e' medial:  9.7 LV IVS:        1.00 cm   LV e' lateral:   10.00 cm/s LVOT diam:     2.20 cm   LV E/e' lateral: 9.1 LV SV:         99 LV SV Index:   55 LVOT Area:     3.80 cm  RIGHT VENTRICLE RV Basal diam:  3.45 cm RV Mid diam:    3.00 cm RV S prime:     27.40 cm/s TAPSE (M-mode): 3.3 cm LEFT ATRIUM             Index        RIGHT ATRIUM           Index LA  diam:        4.80 cm 2.69 cm/m   RA Area:     18.90 cm LA Vol (A2C):   59.6 ml 33.41 ml/m  RA Volume:   54.10 ml  30.32 ml/m LA Vol (A4C):   66.9 ml 37.50 ml/m LA Biplane Vol: 65.0 ml 36.43 ml/m  AORTIC VALVE AV Area (Vmax):    3.09 cm AV Area (Vmean):   3.04 cm AV Area (VTI):     3.04 cm AV Vmax:           214.50 cm/s AV Vmean:  136.750 cm/s AV VTI:            0.326 m AV Peak Grad:      18.4 mmHg AV Mean Grad:      9.0 mmHg LVOT Vmax:         174.50 cm/s LVOT Vmean:        109.500 cm/s LVOT VTI:          0.260 m LVOT/AV VTI ratio: 0.80  AORTA Ao Root diam: 3.20 cm MITRAL VALVE                TRICUSPID VALVE MV Area (PHT): 3.72 cm     TR Peak grad:   27.9 mmHg MV Area VTI:   2.90 cm     TR Vmax:        264.00 cm/s MV Peak grad:  8.0 mmHg MV Mean grad:  4.0 mmHg     SHUNTS MV Vmax:       1.41 m/s     Systemic VTI:  0.26 m MV Vmean:      91.6 cm/s    Systemic Diam: 2.20 cm MV Decel Time: 204 msec MV E velocity: 90.70 cm/s MV A velocity: 117.00 cm/s MV E/A ratio:  0.78 Julien Nordmann MD Electronically signed by Julien Nordmann MD Signature Date/Time: 01/31/2023/5:43:31 PM    Final    DG Thoracic Spine 2 View  Result Date: 01/31/2023 CLINICAL DATA:  Back pain. 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 EXAM: THORACIC SPINE 2 VIEWS COMPARISON:  MRI of the cervicothoracic spine 01/24/2023. Intraoperative imaging of the thoracic spine 01/28/2023. Chest CTA 01/24/2023. 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 Counting superiorly from the T12 level, the patient has undergone midthoracic spinal decompression with posterior pedicle screw and rod fusion from T4 through T9. The hardware appears intact and well positioned. Chronic superior endplate compression deformity at T11 is unchanged. No acute osseous complications are identified. There is mild atelectasis at both lung bases. IMPRESSION: No demonstrated acute findings  status post recent midthoracic spinal decompression and fusion. Electronically Signed   By: Carey Bullocks M.D.   On: 01/31/2023 16:27   DG Chest Port 1 View  Result Date: 01/30/2023 CLINICAL DATA:  Cough, shortness of breath EXAM: PORTABLE CHEST 1 VIEW COMPARISON:  01/24/2023 FINDINGS: Cardiomegaly. Diffuse bilateral interstitial pulmonary opacity and trace pleural effusions. Interval thoracic fusion. IMPRESSION: Cardiomegaly with diffuse bilateral interstitial pulmonary opacity and trace pleural effusions, most consistent with edema. Electronically Signed   By: Jearld Lesch M.D.   On: 01/30/2023 14:07   DG Thoracic Spine 2 View  Result Date: 01/28/2023 CLINICAL DATA:  Elective surgery. T4 through T9 fusion of biopsy for lesion with intra 3D imaging. EXAM: THORACIC SPINE 2 VIEWS COMPARISON:  Preoperative imaging. FINDINGS: Intraoperative 3D imaging of the thoracic spine obtained. Posterior rod with pedicle screws at multiple levels. Known paravertebral mass better assessed on preoperative imaging. No fluoroscopic spot views provided. Fluoroscopy time 1 minutes 30 seconds. Dose is 97.045 mGy IMPRESSION: Intraoperative 3D imaging of the thoracic spine. Electronically Signed   By: Narda Rutherford M.D.   On: 01/28/2023 16:28   DG C-Arm 1-60 Min-No Report  Result Date: 01/28/2023 Fluoroscopy was utilized by the requesting physician.  No radiographic interpretation.   DG C-Arm 1-60 Min-No Report  Result Date: 01/28/2023 Fluoroscopy was utilized by the requesting physician.  No radiographic interpretation.   DG C-Arm 1-60 Min-No Report  Result Date: 01/28/2023  Fluoroscopy was utilized by the requesting physician.  No radiographic interpretation.   DG C-Arm 1-60 Min-No Report  Result Date: 01/28/2023 Fluoroscopy was utilized by the requesting physician.  No radiographic interpretation.    Assessment and plan- Patient is a 57 y.o. female with history of newly diagnosed IgG lambda multiple  myeloma admitted for acute on chronic hypoxic and hypercapnic respiratory failure secondary to COPD exacerbation  Acute on chronic hypoxic respiratory failure: She is presently on 3 L of oxygen.  She is not currently on antibiotics and is being treated with Lasix and steroids for COPD exacerbation as well as volume overload.  I would caution with use of Lasix especially given her history of myeloma presenting with recent acute kidney injury and a creatinine of 5.  Management per ICU team.  She will need close follow-up with pulmonary team upon discharge  Multiple myeloma: This was supposed to be her off treatment week and she was supposed to start treatment next week which may be delayed based on her present hospitalization course.  Overall patient is young with a newly diagnosed myeloma which is highly treatable and she would be a potential transplant candidate down the line.  I would recommend that she should be treated aggressively and be given a trial of intubation if it comes to that point should her respiratory status worsen.  I have discussed all this with her in great detail.  Palliative care will also be seeing her today and assisting with her pain regimen.   Thank you for this kind referral and the opportunity to participate in the care of this  Patient   Visit Diagnosis 1. Acute respiratory failure with hypoxia (HCC)   2. Pneumonia of left lower lobe due to infectious organism   3. Acute on chronic respiratory failure with hypoxemia (HCC)     Dr. Owens Shark, MD, MPH [facility] [phone] 02/24/2023

## 2023-02-25 ENCOUNTER — Other Ambulatory Visit: Payer: Self-pay

## 2023-02-25 ENCOUNTER — Telehealth: Payer: Self-pay

## 2023-02-25 ENCOUNTER — Encounter: Payer: Self-pay | Admitting: Emergency Medicine

## 2023-02-25 ENCOUNTER — Encounter (HOSPITAL_COMMUNITY): Payer: Self-pay | Admitting: Oncology

## 2023-02-25 ENCOUNTER — Inpatient Hospital Stay: Payer: Managed Care, Other (non HMO)

## 2023-02-25 ENCOUNTER — Other Ambulatory Visit (HOSPITAL_COMMUNITY): Payer: Self-pay

## 2023-02-25 DIAGNOSIS — G894 Chronic pain syndrome: Secondary | ICD-10-CM | POA: Diagnosis present

## 2023-02-25 DIAGNOSIS — J441 Chronic obstructive pulmonary disease with (acute) exacerbation: Secondary | ICD-10-CM | POA: Diagnosis not present

## 2023-02-25 DIAGNOSIS — A419 Sepsis, unspecified organism: Secondary | ICD-10-CM

## 2023-02-25 DIAGNOSIS — J189 Pneumonia, unspecified organism: Secondary | ICD-10-CM | POA: Diagnosis not present

## 2023-02-25 DIAGNOSIS — G9341 Metabolic encephalopathy: Secondary | ICD-10-CM | POA: Diagnosis present

## 2023-02-25 DIAGNOSIS — J9621 Acute and chronic respiratory failure with hypoxia: Secondary | ICD-10-CM | POA: Diagnosis not present

## 2023-02-25 DIAGNOSIS — D6489 Other specified anemias: Secondary | ICD-10-CM | POA: Diagnosis not present

## 2023-02-25 DIAGNOSIS — D72819 Decreased white blood cell count, unspecified: Secondary | ICD-10-CM | POA: Diagnosis present

## 2023-02-25 LAB — CBC
HCT: 18.4 % — ABNORMAL LOW (ref 36.0–46.0)
Hemoglobin: 6.2 g/dL — ABNORMAL LOW (ref 12.0–15.0)
MCH: 31 pg (ref 26.0–34.0)
MCHC: 33.7 g/dL (ref 30.0–36.0)
MCV: 92 fL (ref 80.0–100.0)
Platelets: 182 10*3/uL (ref 150–400)
RBC: 2 MIL/uL — ABNORMAL LOW (ref 3.87–5.11)
RDW: 14 % (ref 11.5–15.5)
WBC: 1.7 10*3/uL — ABNORMAL LOW (ref 4.0–10.5)
nRBC: 0 % (ref 0.0–0.2)

## 2023-02-25 LAB — CBC WITH DIFFERENTIAL/PLATELET
Abs Immature Granulocytes: 0.12 10*3/uL — ABNORMAL HIGH (ref 0.00–0.07)
Basophils Absolute: 0 10*3/uL (ref 0.0–0.1)
Basophils Relative: 1 %
Eosinophils Absolute: 0 10*3/uL (ref 0.0–0.5)
Eosinophils Relative: 1 %
HCT: 20.4 % — ABNORMAL LOW (ref 36.0–46.0)
Hemoglobin: 6.8 g/dL — ABNORMAL LOW (ref 12.0–15.0)
Immature Granulocytes: 6 %
Lymphocytes Relative: 48 %
Lymphs Abs: 0.9 10*3/uL (ref 0.7–4.0)
MCH: 31.2 pg (ref 26.0–34.0)
MCHC: 33.3 g/dL (ref 30.0–36.0)
MCV: 93.6 fL (ref 80.0–100.0)
Monocytes Absolute: 0.1 10*3/uL (ref 0.1–1.0)
Monocytes Relative: 6 %
Neutro Abs: 0.7 10*3/uL — ABNORMAL LOW (ref 1.7–7.7)
Neutrophils Relative %: 38 %
Platelets: 222 10*3/uL (ref 150–400)
RBC: 2.18 MIL/uL — ABNORMAL LOW (ref 3.87–5.11)
RDW: 14.2 % (ref 11.5–15.5)
Smear Review: NORMAL
WBC: 1.9 10*3/uL — ABNORMAL LOW (ref 4.0–10.5)
nRBC: 0 % (ref 0.0–0.2)

## 2023-02-25 LAB — BASIC METABOLIC PANEL
Anion gap: 6 (ref 5–15)
BUN: 18 mg/dL (ref 6–20)
CO2: 28 mmol/L (ref 22–32)
Calcium: 8.1 mg/dL — ABNORMAL LOW (ref 8.9–10.3)
Chloride: 103 mmol/L (ref 98–111)
Creatinine, Ser: 1.37 mg/dL — ABNORMAL HIGH (ref 0.44–1.00)
GFR, Estimated: 44 mL/min — ABNORMAL LOW (ref >60)
Glucose, Bld: 88 mg/dL (ref 70–99)
Potassium: 3.1 mmol/L — ABNORMAL LOW (ref 3.5–5.1)
Sodium: 137 mmol/L (ref 135–145)

## 2023-02-25 LAB — LEGIONELLA PNEUMOPHILA SEROGP 1 UR AG: L. pneumophila Serogp 1 Ur Ag: NEGATIVE

## 2023-02-25 LAB — PREPARE RBC (CROSSMATCH)

## 2023-02-25 LAB — HEMOGLOBIN AND HEMATOCRIT, BLOOD
HCT: 20.9 % — ABNORMAL LOW (ref 36.0–46.0)
Hemoglobin: 6.8 g/dL — ABNORMAL LOW (ref 12.0–15.0)

## 2023-02-25 LAB — MAGNESIUM: Magnesium: 1.9 mg/dL (ref 1.7–2.4)

## 2023-02-25 LAB — PHOSPHORUS: Phosphorus: 3.1 mg/dL (ref 2.5–4.6)

## 2023-02-25 LAB — PATHOLOGIST SMEAR REVIEW

## 2023-02-25 MED ORDER — CLONAZEPAM 0.5 MG PO TABS
1.0000 mg | ORAL_TABLET | Freq: Two times a day (BID) | ORAL | Status: DC | PRN
  Administered 2023-02-26 – 2023-02-27 (×3): 1 mg via ORAL
  Filled 2023-02-25: qty 1
  Filled 2023-02-25 (×2): qty 2

## 2023-02-25 MED ORDER — ZOLPIDEM TARTRATE 5 MG PO TABS
5.0000 mg | ORAL_TABLET | Freq: Every evening | ORAL | Status: DC | PRN
  Administered 2023-02-26: 10 mg via ORAL
  Filled 2023-02-25: qty 2

## 2023-02-25 MED ORDER — SODIUM CHLORIDE 0.9% IV SOLUTION: Freq: Once | INTRAVENOUS | Status: AC

## 2023-02-25 MED ORDER — CYCLOBENZAPRINE HCL 5 MG PO TABS
5.0000 mg | ORAL_TABLET | Freq: Three times a day (TID) | ORAL | Status: DC | PRN
  Filled 2023-02-25: qty 2

## 2023-02-25 MED ORDER — POTASSIUM CHLORIDE CRYS ER 20 MEQ PO TBCR
40.0000 meq | EXTENDED_RELEASE_TABLET | Freq: Once | ORAL | Status: AC
  Administered 2023-02-25: 40 meq via ORAL
  Filled 2023-02-25: qty 2

## 2023-02-25 MED ORDER — BUTALBITAL-APAP-CAFFEINE 50-325-40 MG PO TABS
1.0000 | ORAL_TABLET | ORAL | Status: DC | PRN
  Administered 2023-02-26 (×2): 1 via ORAL
  Filled 2023-02-25 (×2): qty 1

## 2023-02-25 MED ORDER — SODIUM CHLORIDE 0.9 % IV SOLN
500.0000 mg | INTRAVENOUS | Status: DC
  Administered 2023-02-25 – 2023-02-26 (×2): 500 mg via INTRAVENOUS
  Filled 2023-02-25 (×2): qty 5

## 2023-02-25 MED ORDER — PROCHLORPERAZINE MALEATE 10 MG PO TABS: 10.0000 mg | ORAL_TABLET | Freq: Four times a day (QID) | ORAL | Status: DC | PRN

## 2023-02-25 MED ORDER — DM-GUAIFENESIN ER 30-600 MG PO TB12
1.0000 | ORAL_TABLET | Freq: Two times a day (BID) | ORAL | Status: DC
  Administered 2023-02-25 – 2023-02-27 (×4): 1 via ORAL
  Filled 2023-02-25 (×4): qty 1

## 2023-02-25 MED ORDER — SODIUM CHLORIDE 0.9 % IV SOLN
1.0000 g | INTRAVENOUS | Status: DC
  Administered 2023-02-25 – 2023-02-26 (×2): 1 g via INTRAVENOUS
  Filled 2023-02-25 (×3): qty 10

## 2023-02-25 NOTE — Assessment & Plan Note (Signed)
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

## 2023-02-25 NOTE — Assessment & Plan Note (Signed)
POA due to respiratory failure, hypoxia, acute illnesses.  Mentation improved to baseline today. --Mgmt of underlying issues as outlined --Delirium precautions --Being monitored on CIWA

## 2023-02-25 NOTE — Assessment & Plan Note (Signed)
Body mass index is 37.42 kg/m. Complicates overall care and prognosis.  Recommend lifestyle modifications including physical activity and diet for weight loss and overall long-term health.

## 2023-02-25 NOTE — Assessment & Plan Note (Signed)
Renal function recovering with treatment of multiple myeloma.  Baseline in early August, Cr 0.7-0.9.  Severe AKI in later August with Cr peak above 5, since improving. Cr 1.37 today --Monitor BMP's

## 2023-02-25 NOTE — Assessment & Plan Note (Signed)
Suspect due to chemo for MM --Monitor CBC's daily --Monitor for neutropenia --Oncology following

## 2023-02-25 NOTE — Telephone Encounter (Signed)
Oral Oncology Patient Advocate Encounter  New authorization   Received notification that prior authorization for Lenalidomide is required.   PA submitted via e-fax to [employer] at [phone] on 02/25/23  Case ID: [id number]  Status is pending     Ardeen Fillers, CPhT Oncology Pharmacy Patient Advocate  [facility]  [phone] (phone) [phone] (fax) 02/25/2023 9:17 AM

## 2023-02-25 NOTE — Hospital Course (Signed)
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    Patient was admitted to ICU initially, requiring acute BiPAP and high risk for intubation.   See H&P and PCCM notes for details.  For COPD with acute exacerbation and pneumonia, patient was started on IV steroids, neb treatments, IV antibiotics.  Oncology and palliative care were consulted.  [facility] assumed care 02/25/23. Patient off BiPAP and stable on 3 L/min [place] O2. Stable for transfer to med/tele floor.

## 2023-02-25 NOTE — Progress Notes (Signed)
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 Pharmacy consulted to assist with electrolyte monitoring and replacement as indicated.  Goal of Therapy:  Electrolytes within normal limits  Plan:  K+ 3.1 KCl x 1  Follow-up electrolytes with AM labs tomorrow   Elliot Gurney, PharmD, BCPS Clinical Pharmacist  02/25/2023 10:23 AM

## 2023-02-25 NOTE — Assessment & Plan Note (Signed)
Oncology and Palliative Care following. Pt on treatment.  Next treatment might be delayed depending on clinical course of illness.  Per oncology, highly treatable & pt could be potential transplant candidate in future.

## 2023-02-25 NOTE — Assessment & Plan Note (Signed)
--  levothyroxine resumed

## 2023-02-25 NOTE — Assessment & Plan Note (Signed)
Due to multiple myeloma, ?myelosuppression for MM treatment. No signs of bleeding. --Hbg 6.2 >> 6.8 this AM --pRBC transfusion this AM --Trend Hbg & transfuse if < 7

## 2023-02-25 NOTE — Assessment & Plan Note (Signed)
Depression Resumed on home meds - Lexapro, Klonopin PRN  Insomnia Resumed home Ambien

## 2023-02-25 NOTE — Progress Notes (Signed)
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    Patient was admitted to ICU initially, requiring acute BiPAP and high risk for intubation.   See H&P and PCCM notes for details.  For COPD with acute exacerbation and pneumonia, patient was started on IV steroids, neb treatments, IV antibiotics.  Oncology and palliative care were consulted.  TRH assumed care 02/25/23. Patient off BiPAP and stable on 3 L/min Ward O2. Stable for transfer to med/tele floor.       Assessment and Plan: * Acute on chronic respiratory failure with hypoxemia (HCC) Due to pneumonia, COPD exacerbation, new left pleural effusion. --Supplement O2, goal sats > 90% --Mgmt of underlying conditions as outlined --Pulmonary hygiene with IS, flutter  COPD with acute exacerbation (HCC) --Continue IV steroids --Pulmicort and Duonebs --S/P IV Lasix, further diuresis PRN --Antibiotics for pneumonia as outlined --Mucinex --Bipap PRN  Acute metabolic encephalopathy POA due to respiratory failure, hypoxia, acute illnesses.  Mentation improved to baseline today. 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  Baseline in early August, Cr 0.7-0.9.  Severe AKI in later August with Cr peak above 5, since improving. Cr 1.37 today --Monitor BMP's  Multiple myeloma [facility]) Oncology and Palliative Care following. Pt on treatment.  Next treatment might be delayed depending on clinical course of illness.  Per oncology, highly treatable & pt could be potential transplant candidate in future.   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 No signs of bleeding. --Hbg 6.2 >> 6.8 this AM --pRBC transfusion this AM --Trend Hbg & transfuse if < 7  Anxiety Depression Resumed on home meds - Lexapro, Klonopin PRN  Insomnia Resumed home Ambien  OSA (obstructive sleep apnea) Not on CPAP  Tobacco use disorder Counseled on cessation  Obesity (BMI 30-39.9) Body mass index is 37.42 kg/m. Complicates overall care and prognosis.  Recommend lifestyle modifications including physical activity and diet for weight loss and overall long-term health.  Hypothyroidism --levothyroxine resumed        Subjective: Pt seen in ICU this AM. Requesting her home meds to be restarted including klonopin, gabapentin.  She reports breathing improved.  She is having pain with inspiration from her chest to back, increased since thoracentesis yesterday.     Physical Exam: Vitals:   02/25/23 1400 02/25/23 1500 02/25/23 1600 02/25/23 1700  BP: (!) 116/45 (!) 111/49 (!) 116/59   Pulse: 86 83  81 80  Resp: (!) 26 17 18 18   Temp:   98.9 F (37.2 C)   TempSrc:      SpO2: 98% 96% 96% 92%  Weight:       Height:       General exam: awake, alert, no acute distress HEENT: moist mucus membranes, hearing grossly normal  Respiratory system: severely diminished breath sounds and diminished bases due to shallow inspirations, expiratory wheezes, +rhonchi, normal respiratory effort. On 3 L/min  O2. Cardiovascular system: normal S1/S2, RRR, no pedal edema.   Gastrointestinal system: soft, NT, ND, no HSM felt, +bowel sounds. Central nervous system: A&O x3. no gross focal neurologic deficits, normal speech Extremities: moves all, no edema, normal tone Skin: dry, intact, normal temperature Psychiatry: normal mood, congruent affect, judgement and insight appear normal   Data Reviewed:  Notable labs --  K 3.1 Cr improved 1.37 Ca 8.1 WBC 1.7 >> 1.9 Hbg 7.8 >> 6.2 >> 6.8  TSH 0.561 normal / free T4 1.26 elevated  Micro -   Blood cultures 9/8 -- neg to date  Pleural Fluid culture 9/9 ---  pending  Pending -- Acid fast cutlures w/ reflexed sensitivities, acid fast smear    Family Communication: None  Disposition: Status is: Inpatient Remains inpatient appropriate because: severity of illness on IV therapies   Planned Discharge Destination: Home    Time spent: 55 minutes  Author: Pennie Banter, DO 02/25/2023 6:04 PM  For on call review [email or web address].

## 2023-02-25 NOTE — Assessment & Plan Note (Signed)
Counseled on cessation 

## 2023-02-25 NOTE — Plan of Care (Signed)
  Problem: Education: Goal: Ability to verbalize activity precautions or restrictions will improve Outcome: Progressing Goal: Knowledge of the prescribed therapeutic regimen will improve Outcome: Progressing Goal: Understanding of discharge needs will improve Outcome: Progressing   Problem: Activity: Goal: Ability to avoid complications of mobility impairment will improve Outcome: Progressing Goal: Ability to tolerate increased activity will improve Outcome: Progressing Goal: Will remain free from falls Outcome: Progressing   Waiting for bed on bed to go to floor

## 2023-02-25 NOTE — Plan of Care (Signed)
  Problem: Education: Goal: Ability to verbalize activity precautions or restrictions will improve Outcome: Progressing Goal: Knowledge of the prescribed therapeutic regimen will improve Outcome: Progressing Goal: Understanding of discharge needs will improve Outcome: Progressing   Problem: Activity: Goal: Ability to avoid complications of mobility impairment will improve Outcome: Progressing Goal: Ability to tolerate increased activity will improve Outcome: Progressing Goal: Will remain free from falls Outcome: Progressing   

## 2023-02-25 NOTE — Assessment & Plan Note (Signed)
Left Pleural Effusion --Continue IV Rocephin, Zithromax --s/p thoracentesis - follow cultures & cytology --mucinex & other supportive care per orders --follow cultures

## 2023-02-25 NOTE — Assessment & Plan Note (Addendum)
--  Continue IV steroids --Pulmicort and Duonebs --S/P IV Lasix, further diuresis PRN --Antibiotics for pneumonia as outlined --Mucinex --Bipap PRN

## 2023-02-25 NOTE — Assessment & Plan Note (Signed)
Due to pneumonia, COPD exacerbation, new left pleural effusion. --Supplement O2, goal sats > 90% --Mgmt of underlying conditions as outlined --Pulmonary hygiene with IS, flutter

## 2023-02-26 ENCOUNTER — Inpatient Hospital Stay: Payer: Managed Care, Other (non HMO) | Admitting: Oncology

## 2023-02-26 ENCOUNTER — Inpatient Hospital Stay: Payer: Managed Care, Other (non HMO)

## 2023-02-26 ENCOUNTER — Other Ambulatory Visit: Payer: Self-pay | Admitting: Oncology

## 2023-02-26 ENCOUNTER — Other Ambulatory Visit (HOSPITAL_COMMUNITY): Payer: Self-pay

## 2023-02-26 DIAGNOSIS — J9621 Acute and chronic respiratory failure with hypoxia: Secondary | ICD-10-CM | POA: Diagnosis not present

## 2023-02-26 LAB — TYPE AND SCREEN
ABO/RH(D): A POS
Antibody Screen: NEGATIVE
Unit division: 0

## 2023-02-26 LAB — BASIC METABOLIC PANEL
Anion gap: 7 (ref 5–15)
BUN: 21 mg/dL — ABNORMAL HIGH (ref 6–20)
CO2: 28 mmol/L (ref 22–32)
Calcium: 8.2 mg/dL — ABNORMAL LOW (ref 8.9–10.3)
Chloride: 105 mmol/L (ref 98–111)
Creatinine, Ser: 1.35 mg/dL — ABNORMAL HIGH (ref 0.44–1.00)
GFR, Estimated: 45 mL/min — ABNORMAL LOW (ref >60)
Glucose, Bld: 86 mg/dL (ref 70–99)
Potassium: 4 mmol/L (ref 3.5–5.1)
Sodium: 140 mmol/L (ref 135–145)

## 2023-02-26 LAB — ACID FAST SMEAR (AFB, MYCOBACTERIA): Acid Fast Smear: NEGATIVE

## 2023-02-26 LAB — CBC
HCT: 22.2 % — ABNORMAL LOW (ref 36.0–46.0)
Hemoglobin: 7.3 g/dL — ABNORMAL LOW (ref 12.0–15.0)
MCH: 30.5 pg (ref 26.0–34.0)
MCHC: 32.9 g/dL (ref 30.0–36.0)
MCV: 92.9 fL (ref 80.0–100.0)
Platelets: 226 10*3/uL (ref 150–400)
RBC: 2.39 MIL/uL — ABNORMAL LOW (ref 3.87–5.11)
RDW: 14 % (ref 11.5–15.5)
WBC: 1.7 10*3/uL — ABNORMAL LOW (ref 4.0–10.5)
nRBC: 0 % (ref 0.0–0.2)

## 2023-02-26 LAB — COMP PANEL: LEUKEMIA/LYMPHOMA

## 2023-02-26 LAB — BPAM RBC
Blood Product Expiration Date: 202409162359
ISSUE DATE / TIME: 202409100942
Unit Type and Rh: 600

## 2023-02-26 LAB — MAGNESIUM: Magnesium: 1.9 mg/dL (ref 1.7–2.4)

## 2023-02-26 LAB — PHOSPHORUS: Phosphorus: 2.8 mg/dL (ref 2.5–4.6)

## 2023-02-26 NOTE — Plan of Care (Signed)

## 2023-02-26 NOTE — Plan of Care (Signed)
  Problem: Activity: Goal: Ability to avoid complications of mobility impairment will improve Outcome: Progressing Goal: Ability to tolerate increased activity will improve Outcome: Progressing Goal: Will remain free from falls Outcome: Progressing   Problem: Skin Integrity: Goal: Will show signs of wound healing Outcome: Progressing   Problem: Bladder/Genitourinary: Goal: Urinary functional status for postoperative course will improve Outcome: Progressing   Problem: Education: Goal: Knowledge of General Education information will improve Description: Including pain rating scale, medication(s)/side effects and non-pharmacologic comfort measures Outcome: Progressing

## 2023-02-26 NOTE — Progress Notes (Signed)
Hematology/Oncology Consult note [facility]  [phone] [phone]  Patient Care Team: Smitty Cords, DO as PCP - General (Family Medicine) Lemar Livings, Merrily Pew, MD (General Surgery) Shelia Media, MD (Internal Medicine) Creig Hines, MD as Consulting Physician (Oncology)   Name of the patient: Paula Garrett  [id number]  [birth year]   Date of visit: @TODAY @  Diagnosis- ***  Chief complaint/ Reason for visit- ***  Heme/Onc history: ***  Interval history- ***  ECOG PS- *** Pain scale- *** Opioid associated constipation- ***  Review of systems- ROS    Allergies  Allergen Reactions   Hctz [Hydrochlorothiazide]     Hyponatremia      Past Medical History:  Diagnosis Date   Anxiety    Arthritis    Asthma    Barrett's esophagus 2015   COVID-19    03/10/20   Depression    GERD (gastroesophageal reflux disease)    Hypertension    Hypothyroidism    Pneumonia    Smoldering myeloma    Thyroid disease      Past Surgical History:  Procedure Laterality Date   ABLATION  2006   APPLICATION OF INTRAOPERATIVE CT SCAN N/A 01/28/2023   Procedure: APPLICATION OF INTRAOPERATIVE CT SCAN;  Surgeon: Loreen Freud, MD;  Location: [facility] ORS;  Service: Neurosurgery;  Laterality: N/A;   BREAST CYST ASPIRATION Left 1998   BREAST SURGERY     cyst aspiration   BREAST SURGERY  1998   cyst aspiration   COLONOSCOPY  03/2014   COLONOSCOPY WITH PROPOFOL N/A 01/06/2020   Procedure: COLONOSCOPY WITH PROPOFOL;  Surgeon: Toledo, Boykin Nearing, MD;  Location: [facility] ENDOSCOPY;  Service: Gastroenterology;  Laterality: N/A;   ENDOMETRIAL ABLATION     ESOPHAGOGASTRODUODENOSCOPY (EGD) WITH PROPOFOL N/A 08/15/2015   Procedure: ESOPHAGOGASTRODUODENOSCOPY (EGD) WITH PROPOFOL;  Surgeon: Christena Deem, MD;  Location: [facility] ENDOSCOPY;  Service: Endoscopy;  Laterality: N/A;   ESOPHAGOGASTRODUODENOSCOPY (EGD) WITH PROPOFOL N/A 01/22/2016    Procedure: ESOPHAGOGASTRODUODENOSCOPY (EGD) WITH PROPOFOL;  Surgeon: Christena Deem, MD;  Location: [facility] ENDOSCOPY;  Service: Endoscopy;  Laterality: N/A;   ESOPHAGOGASTRODUODENOSCOPY (EGD) WITH PROPOFOL N/A 01/06/2020   Procedure: ESOPHAGOGASTRODUODENOSCOPY (EGD) WITH PROPOFOL;  Surgeon: Toledo, Boykin Nearing, MD;  Location: [facility] ENDOSCOPY;  Service: Gastroenterology;  Laterality: N/A;   GASTRIC BYPASS  2005   HERNIA REPAIR     IR BONE MARROW BIOPSY & ASPIRATION  02/05/2023   NASAL SINUS SURGERY     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   TOTAL THYROIDECTOMY     TUBAL LIGATION      Social History   Socioeconomic History   Marital status: Divorced    Spouse name: Not on file   Number of children: 2   Years of education: Not on file   Highest education level: GED or equivalent  Occupational History   Occupation: Part-time  Tobacco Use   Smoking status: Every Day    Current packs/day: 1.00    Average packs/day: 1 pack/day for 43.0 years (43.0 ttl pk-yrs)    Types: Cigarettes    Start date: 03/15/1980   Smokeless tobacco: Never  Vaping Use   Vaping status: Never Used  Substance and Sexual Activity   Alcohol use: Yes    Alcohol/week: 10.0 - 12.0 standard drinks of alcohol    Types: 8 - 10 Glasses of wine, 2 Standard drinks or equivalent per week   Drug use: No   Sexual activity: Yes  Partners: Male  Other Topics Concern   Not on file  Social History Narrative   Lives in [place] single, divorced       Work - 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       Diet - healthy diet   Exercise - limited   Caffeine use: daily   Social Determinants of Health   Financial Resource Strain: Low Risk  (11/01/2022)   Overall Financial Resource Strain (CARDIA)    Difficulty of Paying Living Expenses: Not very hard  Food Insecurity: No Food Insecurity (02/25/2023)   Hunger Vital Sign    Worried About Running Out of Food in the Last Year: Never true    Ran Out of Food in the Last Year:  Never true  Transportation Needs: No Transportation Needs (02/25/2023)   PRAPARE - Administrator, Civil Service (Medical): No    Lack of Transportation (Non-Medical): No  Physical Activity: Insufficiently Active (11/01/2022)   Exercise Vital Sign    Days of Exercise per Week: 3 days    Minutes of Exercise per Session: 20 min  Stress: Stress Concern Present (11/01/2022)   [employer] of Occupational Health - Occupational Stress Questionnaire    Feeling of Stress : Very much  Social Connections: Socially Isolated (11/01/2022)   Social Connection and Isolation Panel [NHANES]    Frequency of Communication with Friends and Family: More than three times a week    Frequency of Social Gatherings with Friends and Family: Once a week    Attends Religious Services: Never    Database administrator or Organizations: No    Attends Engineer, structural: Not on file    Marital Status: Divorced  Intimate Partner Violence: Not At Risk (02/25/2023)   Humiliation, Afraid, Rape, and Kick questionnaire    Fear of Current or Ex-Partner: No    Emotionally Abused: No    Physically Abused: No    Sexually Abused: No    Family History  Problem Relation Age of Onset   Heart disease Mother    COPD Mother    Arthritis Mother    Cancer Mother        uterine   Lupus Mother    Anxiety disorder Mother    Depression Mother    Drug abuse Mother    COPD Father    Arthritis Father    Heart disease Father    Heart attack Father    Alcohol abuse Father    Heart disease Brother    Heart attack Brother    Drug abuse Brother    Anxiety disorder Brother    Depression Brother    Heart disease Brother    Cancer Brother        liver cancer age 38 y.o   Diabetes Maternal Grandmother    Diabetes Maternal Grandfather    Diabetes Paternal Grandmother    Diabetes Paternal Grandfather    Breast cancer Neg Hx      Current Facility-Administered Medications:    acyclovir (ZOVIRAX) 200 MG  capsule 400 mg, 400 mg, Oral, BID, Kasa, Kurian, MD, 400 mg at 02/26/23 0939   allopurinol (ZYLOPRIM) tablet 100 mg, 100 mg, Oral, Daily, Kasa, Kurian, MD, 100 mg at 02/26/23 0940   budesonide (PULMICORT) nebulizer solution 0.25 mg, 0.25 mg, Nebulization, BID, Ouma, Hubbard Hartshorn, NP, 0.25 mg at 02/26/23 0748   butalbital-acetaminophen-caffeine (FIORICET) 50-325-40 MG per tablet 1 tablet, 1 tablet, Oral, Q4H PRN, Esaw Grandchild A, DO, 1 tablet at 02/26/23  1435   cefTRIAXone (ROCEPHIN) 1 g in sodium chloride 0.9 % 100 mL IVPB, 1 g, Intravenous, Q24H, Esaw Grandchild A, DO, Last Rate: 200 mL/hr at 02/26/23 0949, 1 g at 02/26/23 0949   chlordiazePOXIDE (LIBRIUM) capsule 25 mg, 25 mg, Oral, Q6H PRN, Rust-Chester, Micheline Rough L, NP   [EXPIRED] chlordiazePOXIDE (LIBRIUM) capsule 25 mg, 25 mg, Oral, QID, 25 mg at 02/26/23 0941 **FOLLOWED BY** chlordiazePOXIDE (LIBRIUM) capsule 25 mg, 25 mg, Oral, TID, 25 mg at 02/26/23 1631 **FOLLOWED BY** [START ON 02/27/2023] chlordiazePOXIDE (LIBRIUM) capsule 25 mg, 25 mg, Oral, BH-qamhs **FOLLOWED BY** [START ON 02/28/2023] chlordiazePOXIDE (LIBRIUM) capsule 25 mg, 25 mg, Oral, Daily, Rust-Chester, Micheline Rough L, NP   Chlorhexidine Gluconate Cloth 2 % PADS 6 each, 6 each, Topical, Daily, Kasa, Kurian, MD, 6 each at 02/26/23 0941   clonazePAM (KLONOPIN) tablet 1 mg, 1 mg, Oral, BID PRN, Esaw Grandchild A, DO, 1 mg at 02/26/23 8657   cyclobenzaprine (FLEXERIL) tablet 5-10 mg, 5-10 mg, Oral, TID PRN, Pennie Banter, DO   dextromethorphan-guaiFENesin (MUCINEX DM) 30-600 MG per 12 hr tablet 1 tablet, 1 tablet, Oral, BID, Esaw Grandchild A, DO, 1 tablet at 02/26/23 8469   docusate sodium (COLACE) capsule 100 mg, 100 mg, Oral, BID PRN, Jimmye Norman, NP   enoxaparin (LOVENOX) injection 50 mg, 0.5 mg/kg, Subcutaneous, Q24H, Hunt, Madison H, RPH, 50 mg at 02/25/23 2128   escitalopram (LEXAPRO) tablet 10 mg, 10 mg, Oral, Daily, Kasa, Kurian, MD, 10 mg at 02/26/23 0939    fentaNYL (DURAGESIC) 50 MCG/HR 1 patch, 1 patch, Transdermal, Q72H, Borders, Daryl Eastern, NP, 1 patch at 02/24/23 1745   gabapentin (NEURONTIN) capsule 100 mg, 100 mg, Oral, TID, Belia Heman, Kurian, MD, 100 mg at 02/26/23 1631   HYDROmorphone (DILAUDID) tablet 1-2 mg, 1-2 mg, Oral, Q6H PRN, Borders, Daryl Eastern, NP, 2 mg at 02/26/23 0051   hydrOXYzine (ATARAX) tablet 25 mg, 25 mg, Oral, Q6H PRN, Rust-Chester, Britton L, NP   ipratropium-albuterol (DUONEB) 0.5-2.5 (3) MG/3ML nebulizer solution 3 mL, 3 mL, Nebulization, Q6H, Ouma, Hubbard Hartshorn, NP, 3 mL at 02/26/23 1436   ipratropium-albuterol (DUONEB) 0.5-2.5 (3) MG/3ML nebulizer solution 3 mL, 3 mL, Nebulization, Q6H PRN, Ouma, Hubbard Hartshorn, NP   levothyroxine (SYNTHROID) tablet 250 mcg, 250 mcg, Oral, QAC breakfast, Kasa, Kurian, MD, 250 mcg at 02/26/23 0602   loperamide (IMODIUM) capsule 2-4 mg, 2-4 mg, Oral, PRN, Rust-Chester, Micheline Rough L, NP   methylPREDNISolone sodium succinate (SOLU-MEDROL) 40 mg/mL injection 40 mg, 40 mg, Intravenous, Daily, Ouma, Hubbard Hartshorn, NP, 40 mg at 02/26/23 6295   multivitamin with minerals tablet 1 tablet, 1 tablet, Oral, Daily, Rust-Chester, Micheline Rough L, NP, 1 tablet at 02/26/23 0938   ondansetron (ZOFRAN-ODT) disintegrating tablet 4 mg, 4 mg, Oral, Q6H PRN, Rust-Chester, Micheline Rough L, NP   pantoprazole (PROTONIX) EC tablet 40 mg, 40 mg, Oral, Daily, Tressie Ellis, RPH, 40 mg at 02/26/23 2841   polyethylene glycol (MIRALAX / GLYCOLAX) packet 17 g, 17 g, Oral, Daily PRN, Anna Genre, Hubbard Hartshorn, NP   prochlorperazine (COMPAZINE) tablet 10 mg, 10 mg, Oral, Q6H PRN, Esaw Grandchild A, DO   senna (SENOKOT) tablet 8.6 mg, 1 tablet, Oral, Daily PRN, Borders, Daryl Eastern, NP   thiamine (VITAMIN B1) tablet 100 mg, 100 mg, Oral, Daily, Rust-Chester, Britton L, NP, 100 mg at 02/26/23 0940   zolpidem (AMBIEN) tablet 5-10 mg, 5-10 mg, Oral, QHS PRN, Pennie Banter, DO  Physical exam:  Vitals:   02/26/23 1000 02/26/23 1200  02/26/23 1328 02/26/23  1636  BP: 110/66 112/66 122/65 135/76  Pulse: 89 69 69 79  Resp: 20 16 17    Temp:  98.3 F (36.8 C) 97.9 F (36.6 C) 98 F (36.7 C)  TempSrc:      SpO2: 97% 96% 91% 94%  Weight:   207 lb 0.2 oz (93.9 kg)   Height:       Physical Exam      Latest Ref Rng & Units 02/26/2023    2:43 AM  CMP  Glucose 70 - 99 mg/dL 86   BUN 6 - 20 mg/dL 21   Creatinine 1.61 - 1.00 mg/dL 0.96   Sodium 045 - 409 mmol/L 140   Potassium 3.5 - 5.1 mmol/L 4.0   Chloride 98 - 111 mmol/L 105   CO2 22 - 32 mmol/L 28   Calcium 8.9 - 10.3 mg/dL 8.2       Latest Ref Rng & Units 02/26/2023    2:43 AM  CBC  WBC 4.0 - 10.5 K/uL 1.7   Hemoglobin 12.0 - 15.0 g/dL 7.3   Hematocrit 81.1 - 46.0 % 22.2   Platelets 150 - 400 K/uL 226     @IMAGES @  DG Chest Port 1 View  Result Date: 02/25/2023 CLINICAL DATA:  Status post left-sided thoracentesis EXAM: PORTABLE CHEST 1 VIEW COMPARISON:  Chest x-ray 02/23/2023 FINDINGS: There is a moderate-sized left pleural effusion. Heart is enlarged. There central pulmonary vascular congestion. Can not exclude underlying airspace disease in the left lung base. Thoracic spinal fusion hardware is present. IMPRESSION: 1. Moderate-sized left pleural effusion. 2. No evidence for a pneumothorax. 3. Cardiomegaly with central pulmonary vascular congestion. Electronically Signed   By: Darliss Cheney M.D.   On: 02/25/2023 01:43   US THORACENTESIS ASP PLEURAL SPACE W/IMG GUIDE  Result Date: 02/24/2023 INDICATION: 57 year old female. 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 Admitted for altered mental status. Found to have a left-sided pleural effusion. Team is requesting a thoracentesis for further evaluation EXAM: ULTRASOUND GUIDED LEFT-SIDED THERAPEUTIC AND DIAGNOSTIC THORACENTESIS MEDICATIONS: Lidocaine 1% 10 mL COMPLICATIONS: None immediate. PROCEDURE: An ultrasound guided thoracentesis was thoroughly discussed with the  patient and questions answered. The benefits, risks, alternatives and complications were also discussed. The patient understands and wishes to proceed with the procedure. Written consent was obtained. Ultrasound was performed to localize and mark an adequate pocket of fluid in the left chest. The area was then prepped and draped in the normal sterile fashion. 1% Lidocaine was used for local anesthesia. Under ultrasound guidance a 6 Fr Safe-T-Centesis catheter was introduced. Thoracentesis was performed. The catheter was removed and a dressing applied. FINDINGS: A total of approximately 400 mL of serosanguineous fluid was removed. Samples were sent to the laboratory as requested by the clinical team. IMPRESSION: Successful ultrasound guided left-sided therapeutic thoracentesis yielding of pleural fluid. Performed by: Anders Grant, NP Electronically Signed   By: Olive Bass M.D.   On: 02/24/2023 16:21   CT Angio Chest PE W/Cm &/Or Wo Cm  Result Date: 02/23/2023 CLINICAL DATA:  Pulmonary embolism (PE) suspected, high prob l sided opacity on CXR EXAM: CT ANGIOGRAPHY CHEST WITH CONTRAST TECHNIQUE: Multidetector CT imaging of the chest was performed using the standard protocol during bolus administration of intravenous contrast. Multiplanar CT image reconstructions and MIPs were obtained to evaluate the vascular anatomy. RADIATION DOSE REDUCTION: This exam was performed according to the departmental dose-optimization program which includes automated exposure control, adjustment of the mA and/or kV according to  patient size and/or use of iterative reconstruction technique. CONTRAST:  75mL OMNIPAQUE IOHEXOL 350 MG/ML SOLN COMPARISON:  CT chest in 01/24/2023, PET CT 02/12/2023 FINDINGS: Cardiovascular: Poor opacification of the pulmonary arteries due to timing of contrast. No evidence of central pulmonary embolism. The main pulmonary artery is enlarged in caliber measuring up to 3.6 cm. Normal heart size. No  significant pericardial effusion. The thoracic aorta is normal in caliber. Mild atherosclerotic plaque of the thoracic aorta. Four-vessel coronary artery calcifications. Mediastinum/Nodes: Interval development of an enlarged right hilar lymph node measuring to 1.8 cm. No enlarged mediastinal, left hilar, or axillary lymph nodes. Thyroid gland, trachea, and esophagus demonstrate no significant findings. Lungs/Pleura: Partial collapse of left lower lobe. Interval worsening of patchy airspace opacities of the left lower lobe and lingula. Interval increase in size and number right upper lobe cluster of spiculated nodularities with nodule measuring up to 1.3 cm. Persistent scattered pulmonary nodules within the right lower lobe. Similar findings within the right middle lobe. No pulmonary mass. Interval increase in small to moderate volume left pleural effusion . No pneumothorax. Upper Abdomen: Stable 2.8 cm right hepatic hypodensity. Gastric surgical changes with small hiatal hernia. Musculoskeletal: No chest wall abnormality. No acute displaced fracture. T4-T9 posterolateral surgical hardware in the setting of a lytic lesion involving the majority of the T6-T7 levels. Associated similar-appearing 9 x 4.5 cm (5:64) soft tissue mass along the left paraspinal resection bed. Redemonstration of resorption of the left posterior seventh rib. Review of the MIP images confirms the above findings. IMPRESSION: 1. Poor opacification of the pulmonary arteries due to timing of contrast. No central pulmonary embolus. Limited evaluation more distally due to timing of contrast. 2. Enlarged main pulmonary artery-correlate for pulmonary hypertension. 3. Partial collapse of left lower lobe. Interval worsening of patchy airspace opacities of the left lower lobe and lingula. Findings may represent a combination of atelectasis as well as infection/inflammation. 4. Interval increase in small to moderate volume left pleural effusion . 5.  Interval increase in size and number of indeterminate scattered pulmonary nodules within the right lung with largest measuring up to 1.3 cm in the upper lobes. 6. Interval development of an enlarged right hilar lymph node measuring to 1.8 cm. Findings likely reactive in etiology. Recommend attention on follow-up. 7. Redemonstration of lytic lesion involving the majority of the T6-T7 levels with associated similar-appearing 9 x 4.5 cm soft tissue mass along the left paraspinal resection bed. Redemonstration of resorption of the left posterior seventh rib. 8.  Aortic Atherosclerosis (ICD10-I70.0). Electronically Signed   By: Tish Frederickson M.D.   On: 02/23/2023 22:42   CT HEAD WO CONTRAST ( )  Result Date: 02/23/2023 CLINICAL DATA:  Altered mental status EXAM: CT HEAD WITHOUT CONTRAST TECHNIQUE: Contiguous axial images were obtained from the base of the skull through the vertex without intravenous contrast. RADIATION DOSE REDUCTION: This exam was performed according to the departmental dose-optimization program which includes automated exposure control, adjustment of the mA and/or kV according to patient size and/or use of iterative reconstruction technique. COMPARISON:  02/03/2023 FINDINGS: Brain: There is no mass, hemorrhage or extra-axial collection. The size and configuration of the ventricles and extra-axial CSF spaces are normal. The brain parenchyma is normal, without acute or chronic infarction. Vascular: No abnormal hyperdensity of the major intracranial arteries or dural venous sinuses. No intracranial atherosclerosis. Skull: The visualized skull base, calvarium and extracranial soft tissues are normal. Sinuses/Orbits: No fluid levels or advanced mucosal thickening of the visualized paranasal sinuses. No mastoid  or middle ear effusion. The orbits are normal. IMPRESSION: Normal head CT. Electronically Signed   By: Deatra Robinson M.D.   On: 02/23/2023 22:30   DG Chest Port 1 View  Result Date:  02/23/2023 CLINICAL DATA:  Shortness of breath EXAM: PORTABLE CHEST 1 VIEW COMPARISON:  02/18/2023 FINDINGS: Stable cardiomegaly. Moderate left pleural effusion with left basilar opacity. Increasing opacity in the left upper lobe. Interstitial prominence in the right lung. No right-sided pleural effusion. No pneumothorax. IMPRESSION: 1. Moderate left pleural effusion with left basilar opacity, which may represent atelectasis or pneumonia. Increasing opacity in the left upper lobe. 2. Interstitial prominence in the right lung, which may reflect edema. Electronically Signed   By: Duanne Guess D.O.   On: 02/23/2023 20:18   DG Chest Port 1 View  Result Date: 02/18/2023 CLINICAL DATA:  History of multiple myeloma and pulmonary embolus. EXAM: PORTABLE CHEST 1 VIEW COMPARISON:  February 16, 2023. FINDINGS: Stable cardiomegaly. Continued presence of wedge-shaped consolidation within the left midlung concerning for pneumonia or infarction as noted on prior exam. Mild central pulmonary vascular congestion is noted. Left basilar atelectasis or infiltrate and effusion cannot be excluded. Postsurgical changes are seen in thoracic spine. IMPRESSION: Continued presence wedge-shaped consolidation with left midlung concerning for pneumonia or infarction as noted on prior exam. Mild central pulmonary vascular congestion. Left basilar atelectasis or infiltrate and pleural effusion cannot be excluded. Electronically Signed   By: Lupita Raider M.D.   On: 02/18/2023 12:43   NM Pulmonary Perfusion  Result Date: 02/18/2023 CLINICAL DATA:  Concern for pulmonary embolism.  Short of breath. EXAM: NUCLEAR MEDICINE PERFUSION LUNG SCAN TECHNIQUE: Perfusion images were obtained in multiple projections after intravenous injection of radiopharmaceutical. RADIOPHARMACEUTICALS:  4.5 mCi Tc-60m MAA COMPARISON:  Chest radiograph 02/18/2023 FINDINGS: Marked decreased relative perfusion to the entire LEFT lung is favored related to attenuation  artifact from large LEFT effusion seen on comparison radiograph. No wedge-shaped peripheral perfusion defects within LEFT or RIGHT lung to suggest acute pulmonary embolism. IMPRESSION: 1. No evidence acute pulmonary embolism. 2. Decreased relative activity in the LEFT lung is favor related to large LEFT effusion. Electronically Signed   By: Genevive Bi M.D.   On: 02/18/2023 12:40   DG Chest Port 1 View  Result Date: 02/16/2023 CLINICAL DATA:  Fever of unknown origin, history of multiple myeloma EXAM: PORTABLE CHEST 1 VIEW COMPARISON:  02/10/2023, 02/12/2023 FINDINGS: Single frontal view of the chest demonstrates stable enlargement the cardiac silhouette. Chronic vascular congestion. Nodular areas of consolidation are again seen bilaterally, unchanged since recent PET scan. There is a new peripheral area of wedge-shaped consolidation within the left midlung, measuring up to 6.3 cm. Small left pleural effusion. No pneumothorax. Postsurgical changes from thoracic fusion. Destructive lesions of the midthoracic spine are not well visualized on this x-ray. IMPRESSION: 1. New peripheral area of wedge-shaped consolidation within the left mid lung, most consistent with focal pneumonia or pulmonary infarct given rapid development since recent PET CT. 2. Small left pleural effusion. 3. Nodular areas of consolidation bilaterally, which may be inflammatory or infectious. No change since recent PET scan. 4. Chronic vascular congestion. Electronically Signed   By: Sharlet Salina M.D.   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 EXAM: NUCLEAR MEDICINE PET WHOLE BODY TECHNIQUE: 10.1 mCi F-18 FDG was injected intravenously. Full-ring  PET imaging was performed from the head to foot after the radiotracer. CT data was obtained and used for attenuation  correction and anatomic localization. Fasting blood glucose: 100 mg/dl COMPARISON:  CT chest 57/84/6962 FINDINGS: Mediastinal blood pool activity: SUV max HEAD/NECK: No hypermetabolic activity in the scalp. No hypermetabolic cervical lymph nodes. Incidental CT findings: none CHEST: At the LEFT paraspinal resection site (T6-T7), there is residual fluid and soft tissue density tissue measuring 6.1 x 3.7 cm. This tissue has no significant metabolic activity. Activity is equal to background blood pool activity. Posterior fusion at this level. No hypermetabolic tissue within the thorax to suggest malignancy within the bones or soft tissue. There are several small nodules in the LEFT lung apex (image 54/6) without metabolic activity. These are new from comparison CT 01/24/2023 and therefore favored foci of infection or inflammation. Small bilateral pleural effusions are present greater on the LEFT. Incidental CT findings: none ABDOMEN/PELVIS: There is moderate metabolic activity through the GE junction. There is postsurgical anatomy of the proximal stomach. No abnormal metabolic activity liver. Scattered physiologic activity in the bowel. No hypermetabolic lymph nodes in the abdomen pelvis. Incidental CT findings: Foley catheter in bladder. SKELETON: Hypermetabolic lesion in the L4 vertebral body with SUV max equal 5.4. associated lytic lesion measuring 19 mm on image 121/series 6. EXTREMITIES: No abnormal hypermetabolic activity in the lower extremities. Incidental CT findings: No sclerotic or lytic lesions on CT imaging. IMPRESSION: 1. Solitary hypermetabolic lytic lesion in the L4 vertebral body consistent multiple myeloma. Metabolic activity is moderate. 2. Residual soft tissue density at the LEFT paraspinal resection site (T6-T7) with no significant metabolic activity. Favor benign postsurgical tissue. 3. Small bilateral pleural effusions greater on the LEFT. 4. Small nodules in the LEFT lung apex without metabolic  activity are favored foci of infection or inflammation. 5. Moderate metabolic activity through the GE junction is favored physiologic or inflammatory. Electronically Signed   By: Genevive Bi M.D.   On: 02/12/2023 13:47   US RENAL  Result Date: 02/10/2023 CLINICAL DATA:  AKI EXAM: RENAL / URINARY TRACT ULTRASOUND COMPLETE COMPARISON:  None Available. FINDINGS: Right Kidney: Renal measurements: 12.5 cm x 5.6 cm x 4.6 cm = volume: 165 mL. Parenchymal echogenicity is normal. There is no hydronephrosis. Left Kidney: Renal measurements: 12.6 cm x 6.4 cm x 5.0 cm = volume: 213 mL. Parenchymal echogenicity is normal. There is no hydronephrosis. Bladder: Decompressed by a Foley catheter. Other: None. IMPRESSION: Unremarkable renal ultrasound. Electronically Signed   By: Lesia Hausen M.D.   On: 02/10/2023 17:03   DG Chest 1 View  Result Date: 02/10/2023 CLINICAL DATA:  Acute kidney injury. Sodium 120. Elevated creatinine. EXAM: CHEST  1 VIEW COMPARISON:  02/01/2023 FINDINGS: Shallow inspiration. Cardiac enlargement. Hazy interstitial infiltrates throughout both lungs most likely representing edema although possibly interstitial pneumonia or chronic fibrosis. Small left pleural effusion with basilar atelectasis or consolidation. This could indicate pneumonia in the appropriate clinical setting. No pneumothorax. Postoperative changes in the thoracic spine. No significant change since prior study. IMPRESSION: Cardiac enlargement with probable interstitial pulmonary edema. Small left pleural effusion with basilar atelectasis or consolidation. Similar appearance to previous study. Electronically Signed   By: Burman Nieves M.D.   On: 02/10/2023 15:50   IR BONE MARROW BIOPSY & ASPIRATION  Result Date: 02/05/2023 INDICATION: Hematologic abnormality EXAM: Bone marrow aspiration and core biopsy using fluoroscopic guidance MEDICATIONS: None. ANESTHESIA/SEDATION: Moderate (conscious) sedation was employed during this  procedure. A total of Versed 1.5 mg and  Fentanyl 75 mcg was administered intravenously. Moderate Sedation Time: 10 minutes. The patient's level of consciousness and vital signs were monitored continuously by radiology nursing throughout the procedure under my direct supervision. FLUOROSCOPY TIME:  Fluoroscopy Time: 0.7 minutes (13 mGy) COMPLICATIONS: None immediate. PROCEDURE: Informed written consent was obtained from the patient after a thorough discussion of the procedural risks, benefits and alternatives. All questions were addressed. Maximal Sterile Barrier Technique was utilized including caps, mask, sterile gowns, sterile gloves, sterile drape, hand hygiene and skin antiseptic. A timeout was performed prior to the initiation of the procedure. The patient was placed prone on the exam table. Limited fluoroscopy of the pelvis was performed for planning purposes. Skin entry site was marked, and the overlying skin was prepped and draped in the standard sterile fashion. Local analgesia was obtained with 1% lidocaine. Using fluoroscopic guidance, an 11 gauge needle was advanced just deep to the cortex of the right posterior ilium. Subsequently, bone marrow aspiration and core biopsy were performed. Specimens were submitted to lab/pathology for handling. Hemostasis was achieved with manual pressure, and a clean dressing was placed. The patient tolerated the procedure well without immediate complication. IMPRESSION: Successful bone marrow aspiration and core biopsy of the right posterior ilium. Electronically Signed   By: Olive Bass M.D.   On: 02/05/2023 15:35   CT HEAD WO CONTRAST ( )  Result Date: 02/03/2023 CLINICAL DATA:  Mental status change, unknown cause. Acute hypoxic respiratory failure EXAM: CT HEAD WITHOUT CONTRAST TECHNIQUE: Contiguous axial images were obtained from the base of the skull through the vertex without intravenous contrast. RADIATION DOSE REDUCTION: This exam was performed according  to the departmental dose-optimization program which includes automated exposure control, adjustment of the mA and/or kV according to patient size and/or use of iterative reconstruction technique. COMPARISON:  Bone survey 10/09/2022. FINDINGS: Brain: No acute intracranial abnormality. Specifically, no hemorrhage, hydrocephalus, mass lesion, acute infarction, or significant intracranial injury. Vascular: No hyperdense vessel or unexpected calcification. Skull: Focal lucent lesions throughout the skull. 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 These lytic lesions were not in the skull on the skull x-ray from 10/09/2022. Appearance is concerning for possible multiple myeloma. No fracture. Sinuses/Orbits: No acute findings Other: None IMPRESSION: No acute intracranial abnormality. Numerous scattered focal lucent lesions throughout the skull. These are new since prior bone survey from 10/09/2022 and concerning for multiple myeloma. Electronically Signed   By: Charlett Nose M.D.   On: 02/03/2023 16:30   DG Chest Port 1 View  Result Date: 02/01/2023 CLINICAL DATA:  Hypoxia EXAM: PORTABLE CHEST 1 VIEW COMPARISON:  01/30/2023 FINDINGS: Bilateral diffuse interstitial thickening and patchy alveolar airspace opacities at the right lung base and left mid lower lung. No pleural effusion or pneumothorax. Stable cardiomediastinal silhouette. No acute osseous abnormality. Posterior spinal fusion of the upper and midthoracic spine. IMPRESSION: 1. Bilateral diffuse interstitial thickening and patchy alveolar airspace opacities at the right lung base and left mid lower lung concerning for pulmonary edema. Electronically Signed   By: Elige Ko M.D.   On: 02/01/2023 10:13   ECHOCARDIOGRAM COMPLETE  Result Date: 01/31/2023    ECHOCARDIOGRAM REPORT   Patient Name:   TABRESHA RAINES Date of Exam: 01/31/2023 Medical Rec #:  [id number]      Height:       62.0 in Accession #:    [id number]     Weight:        170.0 lb Date of Birth:  1965-06-25  BSA:          1.784 m Patient Age:    57 years       BP:           106/57 mmHg Patient Gender: F              HR:           110 bpm. Exam Location:  [facility] Procedure: 2D Echo, Cardiac Doppler and Color Doppler Indications:     Dyspnea  History:         Patient has no prior history of Echocardiogram examinations.                  Signs/Symptoms:Dyspnea, Chest Pain, Fatigue and Shortness of                  Breath; Risk Factors:Hypertension, Sleep Apnea and Current                  Smoker.  Sonographer:     Mikki Harbor Referring Phys:  [id number] Alford Highland Diagnosing Phys: Julien Nordmann MD  Sonographer Comments: Image acquisition challenging due to respiratory motion. IMPRESSIONS  1. Left ventricular ejection fraction, by estimation, is 60 to 65%. The left ventricle has normal function. The left ventricle has no regional wall motion abnormalities. There is mild left ventricular hypertrophy. Left ventricular diastolic parameters are consistent with Grade I diastolic dysfunction (impaired relaxation).  2. Right ventricular systolic function is normal. The right ventricular size is normal. There is normal pulmonary artery systolic pressure. The estimated right ventricular systolic pressure is 30.9 mmHg.  3. Left atrial size was mildly dilated.  4. The mitral valve is normal in structure. No evidence of mitral valve regurgitation. No evidence of mitral stenosis.  5. The aortic valve is tricuspid. Aortic valve regurgitation is not visualized. No aortic stenosis is present.  6. The inferior vena cava is normal in size with greater than 50% respiratory variability, suggesting right atrial pressure of 3 mmHg. FINDINGS  Left Ventricle: Left ventricular ejection fraction, by estimation, is 60 to 65%. The left ventricle has normal function. The left ventricle has no regional wall motion abnormalities. The left ventricular internal cavity size was normal in size. There is  mild left  ventricular hypertrophy. Left ventricular diastolic parameters are consistent with Grade I diastolic dysfunction (impaired relaxation). Right Ventricle: The right ventricular size is normal. No increase in right ventricular wall thickness. Right ventricular systolic function is normal. There is normal pulmonary artery systolic pressure. The tricuspid regurgitant velocity is 2.64 m/s, and  with an assumed right atrial pressure of 3 mmHg, the estimated right ventricular systolic pressure is 30.9 mmHg. Left Atrium: Left atrial size was mildly dilated. Right Atrium: Right atrial size was normal in size. Pericardium: There is no evidence of pericardial effusion. Mitral Valve: The mitral valve is normal in structure. No evidence of mitral valve regurgitation. No evidence of mitral valve stenosis. MV peak gradient, 8.0 mmHg. The mean mitral valve gradient is 4.0 mmHg. Tricuspid Valve: The tricuspid valve is normal in structure. Tricuspid valve regurgitation is mild . No evidence of tricuspid stenosis. Aortic Valve: The aortic valve is tricuspid. Aortic valve regurgitation is not visualized. No aortic stenosis is present. Aortic valve mean gradient measures 9.0 mmHg. Aortic valve peak gradient measures 18.4 mmHg. Aortic valve area, by VTI measures 3.04  cm. Pulmonic Valve: The pulmonic valve was normal in structure. Pulmonic valve regurgitation is not visualized. No evidence of pulmonic stenosis. Aorta:  The aortic root is normal in size and structure. Venous: The inferior vena cava is normal in size with greater than 50% respiratory variability, suggesting right atrial pressure of 3 mmHg. IAS/Shunts: No atrial level shunt detected by color flow Doppler.  LEFT VENTRICLE PLAX 2D LVIDd:         5.80 cm   Diastology LVIDs:         3.60 cm   LV e' medial:    9.36 cm/s LV PW:         0.90 cm   LV E/e' medial:  9.7 LV IVS:        1.00 cm   LV e' lateral:   10.00 cm/s LVOT diam:     2.20 cm   LV E/e' lateral: 9.1 LV SV:         99  LV SV Index:   55 LVOT Area:     3.80 cm  RIGHT VENTRICLE RV Basal diam:  3.45 cm RV Mid diam:    3.00 cm RV S prime:     27.40 cm/s TAPSE (M-mode): 3.3 cm LEFT ATRIUM             Index        RIGHT ATRIUM           Index LA diam:        4.80 cm 2.69 cm/m   RA Area:     18.90 cm LA Vol (A2C):   59.6 ml 33.41 ml/m  RA Volume:   54.10 ml  30.32 ml/m LA Vol (A4C):   66.9 ml 37.50 ml/m LA Biplane Vol: 65.0 ml 36.43 ml/m  AORTIC VALVE AV Area (Vmax):    3.09 cm AV Area (Vmean):   3.04 cm AV Area (VTI):     3.04 cm AV Vmax:           214.50 cm/s AV Vmean:          136.750 cm/s AV VTI:            0.326 m AV Peak Grad:      18.4 mmHg AV Mean Grad:      9.0 mmHg LVOT Vmax:         174.50 cm/s LVOT Vmean:        109.500 cm/s LVOT VTI:          0.260 m LVOT/AV VTI ratio: 0.80  AORTA Ao Root diam: 3.20 cm MITRAL VALVE                TRICUSPID VALVE MV Area (PHT): 3.72 cm     TR Peak grad:   27.9 mmHg MV Area VTI:   2.90 cm     TR Vmax:        264.00 cm/s MV Peak grad:  8.0 mmHg MV Mean grad:  4.0 mmHg     SHUNTS MV Vmax:       1.41 m/s     Systemic VTI:  0.26 m MV Vmean:      91.6 cm/s    Systemic Diam: 2.20 cm MV Decel Time: 204 msec MV E velocity: 90.70 cm/s MV A velocity: 117.00 cm/s MV E/A ratio:  0.78 Julien Nordmann MD Electronically signed by Julien Nordmann MD Signature Date/Time: 01/31/2023/5:43:31 PM    Final    DG Thoracic Spine 2 View  Result Date: 01/31/2023 CLINICAL DATA:  Back pain. 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 EXAM: THORACIC  SPINE 2 VIEWS COMPARISON:  MRI of the cervicothoracic spine 01/24/2023. Intraoperative imaging of the thoracic spine 01/28/2023. Chest CTA 01/24/2023. 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 Counting superiorly from the T12 level, the patient has undergone midthoracic spinal decompression with posterior pedicle screw and rod fusion from T4 through T9. The hardware appears intact and  well positioned. Chronic superior endplate compression deformity at T11 is unchanged. No acute osseous complications are identified. There is mild atelectasis at both lung bases. IMPRESSION: No demonstrated acute findings status post recent midthoracic spinal decompression and fusion. Electronically Signed   By: Carey Bullocks M.D.   On: 01/31/2023 16:27   DG Chest Port 1 View  Result Date: 01/30/2023 CLINICAL DATA:  Cough, shortness of breath EXAM: PORTABLE CHEST 1 VIEW COMPARISON:  01/24/2023 FINDINGS: Cardiomegaly. Diffuse bilateral interstitial pulmonary opacity and trace pleural effusions. Interval thoracic fusion. IMPRESSION: Cardiomegaly with diffuse bilateral interstitial pulmonary opacity and trace pleural effusions, most consistent with edema. Electronically Signed   By: Jearld Lesch M.D.   On: 01/30/2023 14:07   DG Thoracic Spine 2 View  Result Date: 01/28/2023 CLINICAL DATA:  Elective surgery. T4 through T9 fusion of biopsy for lesion with intra 3D imaging. EXAM: THORACIC SPINE 2 VIEWS COMPARISON:  Preoperative imaging. FINDINGS: Intraoperative 3D imaging of the thoracic spine obtained. Posterior rod with pedicle screws at multiple levels. Known paravertebral mass better assessed on preoperative imaging. No fluoroscopic spot views provided. Fluoroscopy time 1 minutes 30 seconds. Dose is 97.045 mGy IMPRESSION: Intraoperative 3D imaging of the thoracic spine. Electronically Signed   By: Narda Rutherford M.D.   On: 01/28/2023 16:28   DG C-Arm 1-60 Min-No Report  Result Date: 01/28/2023 Fluoroscopy was utilized by the requesting physician.  No radiographic interpretation.   DG C-Arm 1-60 Min-No Report  Result Date: 01/28/2023 Fluoroscopy was utilized by the requesting physician.  No radiographic interpretation.   DG C-Arm 1-60 Min-No Report  Result Date: 01/28/2023 Fluoroscopy was utilized by the requesting physician.  No radiographic interpretation.   DG C-Arm 1-60 Min-No  Report  Result Date: 01/28/2023 Fluoroscopy was utilized by the requesting physician.  No radiographic interpretation.     Assessment and plan- Patient is a 57 y.o. female ***   Visit Diagnosis 1. Acute respiratory failure with hypoxia (HCC)   2. Pneumonia of left lower lobe due to infectious organism   3. Acute on chronic respiratory failure with hypoxemia (HCC)   4. Shortness of breath      Dr. Owens Shark, MD, MPH [facility] [phone] 02/26/2023 4:54 PM

## 2023-02-26 NOTE — Progress Notes (Signed)
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     Patient was admitted to ICU initially, requiring acute BiPAP and high risk for intubation.   See H&P and PCCM notes for details.   For COPD with acute exacerbation and pneumonia, patient was started on IV steroids, neb treatments, IV antibiotics.  Oncology and palliative care were consulted.   TR[facility] assumed care 02/25/23. Patient off BiPAP and stable on 3 L/min [place] O2. Stable for transfer to med/tele floor.           Assessment and Plan: * Acute on chronic respiratory failure with hypoxemia (HCC) Due to pneumonia, COPD exacerbation, new left pleural effusion. --Supplement O2, goal sats > 90% --Mgmt of underlying conditions as outlined --Pulmonary hygiene with IS, flutter Continue to wean down oxygen as tolerated   COPD with acute exacerbation (HCC) -- Continue IV steroids --Pulmicort and Duonebs --S/P IV Lasix, further diuresis PRN --Antibiotics for pneumonia as outlined --Mucinex --Bipap PRN   Acute metabolic encephalopathy POA due to respiratory failure, hypoxia, acute illnesses.  Mentation improved to baseline today. --Mgmt of underlying issues as outlined --Delirium precautions --Being monitored on CIWA    Sepsis due to pneumonia (HCC) Left  Pleural Effusion -- Has completed azithromycin course Continue ceftriaxone in the setting of leukopenia --s/p thoracentesis - follow cultures & cytology --mucinex & other supportive care per orders --follow cultures   Leukopenia Suspect due to chemo for MM --Monitor CBC's daily --Monitor for neutropenia -- I have discussed the case with oncologist who has recommended for patient to be on antibiotics at discharge on account of leukopenia due to chemo side effects   Chronic pain syndrome 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  Baseline in early August, Cr 0.7-0.9.  Severe AKI in later August with Cr peak above 5, since improving. Continue to monitor creatinine closely Continue to monitor BMP closely   Multiple myeloma [facility]) Oncology and Palliative Care following. Pt on treatment.  Next treatment might be delayed depending on clinical course of illness.  Per oncology, highly treatable & pt could be potential transplant candidate in future.     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 No signs of bleeding. --Hbg 6.2 >> 6.8 on 02/25/2023 status post 1 unit of blood transfusion We will transfuse when hemoglobin less than 7   Anxiety Depression Continue on home meds - Lexapro, Klonopin PRN   Insomnia Continue home Ambien   OSA (obstructive sleep apnea) Not on CPAP   Tobacco use disorder Counseled on cessation  Obesity (BMI 30-39.9) Body mass index is 37.42 kg/m. Complicates overall care and prognosis.  Recommend lifestyle modifications including physical activity and diet for weight loss and overall long-term health.   Hypothyroidism --levothyroxine resumed         Physical Exam: General exam: awake, alert, no acute distress HEENT: moist mucus membranes, hearing grossly normal  Respiratory system: severely diminished breath sounds and diminished bases due to shallow inspirations, expiratory wheezes, +rhonchi, normal respiratory effort. On 3 L/min [place] O2. Cardiovascular system: normal S1/S2, RRR, no pedal edema.   Gastrointestinal system: soft, NT, ND, no HSM felt, +bowel sounds. Central nervous system: A&O x3. no gross focal neurologic deficits, normal speech Extremities: moves all, no edema, normal tone Skin: dry, intact, normal temperature Psychiatry: normal mood, congruent affect, judgement and insight appear normal     Data Reviewed: Have reviewed below mentioned labs    Latest Ref Rng & Units 02/26/2023    2:43 AM 02/25/2023    6:00 AM 02/25/2023    4:28 AM  CBC  WBC 4.0 - 10.5 K/uL 1.7  1.9  1.7   Hemoglobin 12.0 - 15.0 g/dL 7.3  6.8    6.8  6.2   Hematocrit 36.0 - 46.0 % 22.2  20.9    20.4  18.4   Platelets 150 - 400 K/uL 226  222  182       Notable labs --  K 3.1 Cr improved 1.37 Ca 8.1 WBC 1.7 >> 1.9 Hbg 7.8 >> 6.2 >> 6.8   TSH 0.561 normal / free T4 1.26 elevated   Micro -    Blood cultures 9/8 -- neg to date   Pleural Fluid culture 9/9 ---  pending   Pending -- Acid fast cutlures w/ reflexed sensitivities, acid fast smear       Family Communication: None   Disposition: Status is: Inpatient Remains inpatient appropriate because: severity of illness on IV therapies    Planned Discharge Destination: Home      Vitals:   02/26/23 1000 02/26/23 1200 02/26/23 1328 02/26/23 1636  BP: 110/66 112/66 122/65 135/76  Pulse: 89 69 69 79  Resp: 20 16 17    Temp:  98.3 F (36.8 C) 97.9 F (36.6 C) 98 F (36.7 C)  TempSrc:      SpO2: 97% 96% 91% 94%  Weight:   93.9 kg   Height:         Author: Loyce Dys, MD 02/26/2023 6:11 PM  For on call review [email or web address].

## 2023-02-26 NOTE — Telephone Encounter (Signed)
Oral Oncology Patient Advocate Encounter  Prior Authorization for Lenalidomide has been approved.    PA# [id number]  Effective dates: 02/25/23 through 02/26/24  Patients co-pay is $0.00.    Ardeen Fillers, CPhT Oncology Pharmacy Patient Advocate  [facility]  [phone] (phone) [phone] (fax) 02/26/2023 9:48 AM

## 2023-02-26 NOTE — Progress Notes (Signed)
Physical Therapy Treatment Patient Details Name: Paula Garrett MRN: [id number] DOB: 1966/03/16 Today's Date: 02/26/2023   History of Present Illness Pt is a 57 y.o. female presenting to hospital 02/23/23 with c/o respiratory distress (sats in 70's and accessory muscle use).  Recent hospitalization for AKI and hyperkalemia (pt with chest pain and SOB).  Pt admitted with acute on chronic hypoxic and hypercapnic respiratory failure, acute exacerbation of COPD, B PNA, moderate L pleural effusion, sepsis, AMS, chronic pain syndrome, AKI.  Imaging showing "Redemonstration of lytic lesion involving the majority of the T6-T7 levels with associated similar-appearing 9 x 4.5 cm soft tissue mass along the left paraspinal resection bed. 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    PT Comments  Pt has met all goals for discharge from acute care services. Pt is able to safely ambulate without AD. Pt prefers to be off O2, however explained that sats decrease on RA. Currently has home O2 at baseline. Reports she wants to walk frequently to improve her endurance. Will sign off and task in house mobility team to continue mobility efforts.  SaO2 on room air at rest = n/a% SaO2 on room air while ambulating = 85%     If plan is discharge home, recommend the following: A little help with walking and/or transfers;A little help with bathing/dressing/bathroom;Assist for transportation;Help with stairs or ramp for entrance   Can travel by private vehicle        Equipment Recommendations  None recommended by PT    Recommendations for Other Services       Precautions / Restrictions Precautions Precautions: Fall;Back Precaution Comments: Pt reports TLSO (for prior recent back surgery) was only for comfort and she doesn't have  to wear it (and has not worn it since she left the hospital) Restrictions Weight Bearing Restrictions: No     Mobility  Bed Mobility Overal bed mobility: Independent Bed Mobility: Supine to Sit, Sit to Supine           General bed mobility comments: long sitting in bed upon arrival. Able to exit bed easily    Transfers Overall transfer level: Independent Equipment used: None Transfers: Sit to/from Stand Sit to Stand: Independent           General transfer comment: able to stand easily.    Ambulation/Gait Ambulation/Gait assistance: Modified independent (Device/Increase time) Gait Distance (Feet): 200 Feet Assistive device: None Gait Pattern/deviations: Decreased step length - right, Decreased step length - left       General Gait Details: ambulated in hallway, off O2 (per patient request). Safe technique with upright posture. Able to carry conversation throughout exertion, although does take 1 standing rest break secondary to fatigue. O2 sats decrease to 85% with exertion on RA. Donned 3L once back in room   Stairs             Wheelchair Mobility     Tilt Bed    Modified Rankin (Stroke Patients Only)       Balance Overall balance assessment: Needs assistance Sitting-balance support: No upper extremity supported, Feet supported Sitting balance-Leahy Scale: Good     Standing balance support: During functional activity, No upper extremity supported Standing balance-Leahy Scale: Good  Cognition Arousal: Alert Behavior During Therapy: WFL for tasks assessed/performed Overall Cognitive Status: Within Functional Limits for tasks assessed                                 General Comments: pleasant and agreeable to session, impulsive        Exercises      General Comments        Pertinent Vitals/Pain Pain Assessment Pain Assessment: No/denies pain    Home Living                           Prior Function            PT Goals (current goals can now be found in the care plan section) Acute Rehab PT Goals Patient Stated Goal: to go home PT Goal Formulation: With patient Time For Goal Achievement: 03/10/23 Potential to Achieve Goals: Good Progress towards PT goals: Progressing toward goals;Goals met/education completed, patient discharged from PT    Frequency    Min 1X/week      PT Plan      Co-evaluation              AM-PAC PT "6 Clicks" Mobility   Outcome Measure  Help needed turning from your back to your side while in a flat bed without using bedrails?: None Help needed moving from lying on your back to sitting on the side of a flat bed without using bedrails?: None Help needed moving to and from a bed to a chair (including a wheelchair)?: None Help needed standing up from a chair using your arms (e.g., wheelchair or bedside chair)?: None Help needed to walk in hospital room?: A Little Help needed climbing 3-5 steps with a railing? : A Little 6 Click Score: 22    End of Session Equipment Utilized During Treatment: Oxygen Activity Tolerance: Patient tolerated treatment well Patient left: in bed;with nursing/sitter in room Nurse Communication: Mobility status;Precautions PT Visit Diagnosis: Muscle weakness (generalized) (M62.81)     Time: 1610-9604 PT Time Calculation (min) (ACUTE ONLY): 8 min  Charges:    $Gait Training: 8-22 mins PT General Charges $$ ACUTE PT VISIT: 1 Visit                     Elizabeth Palau, PT, DPT, GCS [phone]    Paula Garrett 02/26/2023, 3:02 PM

## 2023-02-26 NOTE — Progress Notes (Signed)
Report attempted, rn unable to accept

## 2023-02-27 ENCOUNTER — Other Ambulatory Visit: Payer: Self-pay | Admitting: *Deleted

## 2023-02-27 ENCOUNTER — Encounter: Payer: Self-pay | Admitting: *Deleted

## 2023-02-27 DIAGNOSIS — D509 Iron deficiency anemia, unspecified: Secondary | ICD-10-CM

## 2023-02-27 DIAGNOSIS — C9000 Multiple myeloma not having achieved remission: Secondary | ICD-10-CM

## 2023-02-27 DIAGNOSIS — J9621 Acute and chronic respiratory failure with hypoxia: Secondary | ICD-10-CM | POA: Diagnosis not present

## 2023-02-27 LAB — CBC WITH DIFFERENTIAL/PLATELET
Abs Immature Granulocytes: 0 10*3/uL (ref 0.00–0.07)
Basophils Absolute: 0 10*3/uL (ref 0.0–0.1)
Basophils Relative: 1 %
Eosinophils Absolute: 0 10*3/uL (ref 0.0–0.5)
Eosinophils Relative: 1 %
HCT: 22.5 % — ABNORMAL LOW (ref 36.0–46.0)
Hemoglobin: 7.4 g/dL — ABNORMAL LOW (ref 12.0–15.0)
Immature Granulocytes: 0 %
Lymphocytes Relative: 58 %
Lymphs Abs: 1 10*3/uL (ref 0.7–4.0)
MCH: 31.1 pg (ref 26.0–34.0)
MCHC: 32.9 g/dL (ref 30.0–36.0)
MCV: 94.5 fL (ref 80.0–100.0)
Monocytes Absolute: 0.3 10*3/uL (ref 0.1–1.0)
Monocytes Relative: 16 %
Neutro Abs: 0.4 10*3/uL — CL (ref 1.7–7.7)
Neutrophils Relative %: 24 %
Platelets: 258 10*3/uL (ref 150–400)
RBC: 2.38 MIL/uL — ABNORMAL LOW (ref 3.87–5.11)
RDW: 14 % (ref 11.5–15.5)
Smear Review: NORMAL
WBC: 1.6 10*3/uL — ABNORMAL LOW (ref 4.0–10.5)
nRBC: 0 % (ref 0.0–0.2)

## 2023-02-27 LAB — BASIC METABOLIC PANEL
Anion gap: 8 (ref 5–15)
BUN: 21 mg/dL — ABNORMAL HIGH (ref 6–20)
CO2: 27 mmol/L (ref 22–32)
Calcium: 8.4 mg/dL — ABNORMAL LOW (ref 8.9–10.3)
Chloride: 104 mmol/L (ref 98–111)
Creatinine, Ser: 1.23 mg/dL — ABNORMAL HIGH (ref 0.44–1.00)
GFR, Estimated: 51 mL/min — ABNORMAL LOW (ref >60)
Glucose, Bld: 56 mg/dL — ABNORMAL LOW (ref 70–99)
Potassium: 3.9 mmol/L (ref 3.5–5.1)
Sodium: 139 mmol/L (ref 135–145)

## 2023-02-27 LAB — CYTOLOGY - NON PAP

## 2023-02-27 MED ORDER — LEVOTHYROXINE SODIUM 125 MCG PO TABS: 250.0000 ug | ORAL_TABLET | Freq: Every day | ORAL | 0 refills | Status: AC

## 2023-02-27 MED ORDER — VITAMIN B-1 100 MG PO TABS: 100.0000 mg | ORAL_TABLET | Freq: Every day | ORAL | 0 refills | Status: AC

## 2023-02-27 MED ORDER — AMOXICILLIN-POT CLAVULANATE 875-125 MG PO TABS: 1.0000 | ORAL_TABLET | Freq: Two times a day (BID) | ORAL | 0 refills | Status: DC

## 2023-02-27 NOTE — Plan of Care (Signed)

## 2023-02-27 NOTE — Discharge Summary (Signed)
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  AKI (acute kidney injury) (HCC) Multiple myeloma (HCC) 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     Patient was admitted to ICU initially, requiring acute BiPAP and high risk for intubation.     For COPD with acute exacerbation and pneumonia, patient was started on IV steroids, neb treatments, IV antibiotics.  Oncology and palliative care were consulted.  Patient found to have leukopenia secondary to chemotherapy related .  Her respiratory function improved and patient was transferred out of the ICU today for follow-up.  Patient continued to improve on antibiotics and today doing much better and therefore being discharged on her regular home oxygen and to follow-up with oncologist and also discharged on Augmentin as recommended by oncologist.  Consultants: Oncology Procedures performed:  None Disposition: Home Diet recommendation:  Cardiac diet DISCHARGE MEDICATION: Allergies as of 02/27/2023       Reactions   Hctz [hydrochlorothiazide]    Hyponatremia         Medication List     STOP taking these medications    azithromycin 500 MG tablet Commonly known as: ZITHROMAX   cetirizine 10 MG tablet Commonly known as: ZYRTEC       TAKE these medications    acyclovir 400 MG tablet Commonly known as: ZOVIRAX Take 1 tablet (400 mg total) by mouth 2 (two) times daily.   AeroChamber MV inhaler Use as instructed   albuterol 108 (90 Base) MCG/ACT inhaler Commonly known as: VENTOLIN HFA Inhale 2 puffs into the lungs every 6 (six) hours as needed for wheezing or shortness of breath.   allopurinol 100 MG tablet Commonly known as: ZYLOPRIM Take 1 tablet (100 mg total) by mouth daily.   amLODipine 10 MG tablet Commonly known as: NORVASC Take 1 tablet (10 mg total) by mouth daily.   amoxicillin-clavulanate 875-125 MG tablet Commonly known as: AUGMENTIN Take 1 tablet by mouth every 12 (twelve) hours for 7 days.   aspirin EC 81 MG tablet Take 1 tablet (81 mg total) by mouth daily. Swallow whole.   butalbital-acetaminophen-caffeine 50-325-40 MG tablet Commonly known as: FIORICET Take 1 tablet by mouth every 4 (four) hours as needed for headache.   clonazePAM 1 MG tablet Commonly known as: KLONOPIN Take 1 tablet (1 mg total) by mouth 2 (two) times daily.   cyanocobalamin 1000 MCG/ML injection Commonly known as: VITAMIN B12 INJECT 1 ML IN THE MUSCLE EVERY 30 DAYS   cyclobenzaprine 10 MG tablet Commonly known as: FLEXERIL Take 0.5-1 tablets (5-10 mg total) by mouth 3 (three) times daily as needed for  muscle spasms.   dexamethasone 4 MG tablet Commonly known as: DECADRON Take 10 tablets (40 mg) on Day 15 of each cycle. Repeat every 21 days.Take with breakfast.   docusate sodium 100 MG capsule Commonly known as: COLACE Take 1 capsule (100 mg total) by  mouth 2 (two) times daily.   escitalopram 10 MG tablet Commonly known as: Lexapro Take 1 tablet (10 mg total) by mouth daily.   fentaNYL 50 MCG/HR Commonly known as: DURAGESIC Place 1 patch onto the skin every 3 (three) days.   gabapentin 100 MG capsule Commonly known as: NEURONTIN Take 1 capsule (100 mg total) by mouth 3 (three) times daily.   HYDROmorphone 2 MG tablet Commonly known as: DILAUDID Take 0.5-1 tablets (1-2 mg total) by mouth every 6 (six) hours as needed for severe pain.   ipratropium-albuterol 0.5-2.5 (3) MG/3ML Soln Commonly known as: DUONEB Take 3 mLs by nebulization 4 (four) times daily.   levothyroxine 125 MCG tablet Commonly known as: SYNTHROID Take 2 tablets (250 mcg total) by mouth daily before breakfast.   mometasone-formoterol 200-5 MCG/ACT Aero Commonly known as: DULERA Inhale 2 puffs into the lungs 2 (two) times daily.   ondansetron 8 MG tablet Commonly known as: Zofran Take 1 tablet (8 mg total) by mouth 2 (two) times daily as needed for nausea or vomiting. Start on day 3 after Cytoxan.   pantoprazole 40 MG tablet Commonly known as: PROTONIX 30 min before lunch or dinner   polyethylene glycol 17 g packet Commonly known as: MIRALAX / GLYCOLAX Take 17 g by mouth 2 (two) times daily.   prochlorperazine 10 MG tablet Commonly known as: COMPAZINE Take 1 tablet (10 mg total) by mouth every 6 (six) hours as needed for nausea or vomiting.   sodium bicarbonate 650 MG tablet Take 1 tablet (650 mg total) by mouth 3 (three) times daily.   SYRINGE-NEEDLE (DISP) 3 ML 25G X 1-1/2" 3 ML Misc 1 Device by Does not apply route every 30 (thirty) days. With B12 shot   thiamine 100 MG tablet Commonly known as: Vitamin B-1 Take 1 tablet (100 mg total) by mouth daily. Start taking on: February 28, 2023   umeclidinium bromide 62.5 MCG/ACT Aepb Commonly known as: INCRUSE ELLIPTA Inhale 1 puff into the lungs daily.   Vitamin D (Ergocalciferol) 1.25 MG  (50000 UNIT) Caps capsule Commonly known as: DRISDOL Take 50,000 Units by mouth every 7 (seven) days.   zolpidem 10 MG tablet Commonly known as: AMBIEN Take 0.5-1 tablets (5-10 mg total) by mouth at bedtime as needed for sleep.        Discharge Exam: Filed Weights   02/26/23 0500 02/26/23 1328 02/27/23 0500  Weight: 94.4 kg 93.9 kg 92.6 kg   General exam: awake, alert, no acute distress HEENT: moist mucus membranes, hearing grossly normal  Respiratory system: severely diminished breath sounds and diminished bases due to shallow inspirations, expiratory wheezes, +rhonchi, normal respiratory effort. On 3 L/min [place] O2. Cardiovascular system: normal S1/S2, RRR, no pedal edema.   Gastrointestinal system: soft, NT, ND, no HSM felt, +bowel sounds. Central nervous system: A&O x3. no gross focal neurologic deficits, normal speech Extremities: moves all, no edema, normal tone Skin: dry, intact, normal temperature Psychiatry: normal mood, congruent affect, judgement and insight appear normal    Condition at discharge: good     Discharge time spent:  35 minutes.  Signed: Loyce Dys, MD [facility] 02/27/2023

## 2023-02-27 NOTE — Progress Notes (Signed)
Patient to be discharged today from the hospital on home oxygen.  I did discuss her case with Dr. Meriam Sprague.  She will need to be discharged on Augmentin given her neutropenia as prophylaxis.  I will follow-up with her closely upon discharge and I will also reach out to pulmonary to arrange follow-up with her  Dr. Owens Shark, MD, MPH [facility] Pager- [phone] 02/27/2023 9:23 AM

## 2023-02-28 ENCOUNTER — Other Ambulatory Visit: Payer: Self-pay | Admitting: Family Medicine

## 2023-02-28 ENCOUNTER — Inpatient Hospital Stay: Payer: Managed Care, Other (non HMO)

## 2023-02-28 DIAGNOSIS — F5104 Psychophysiologic insomnia: Secondary | ICD-10-CM

## 2023-02-28 DIAGNOSIS — F419 Anxiety disorder, unspecified: Secondary | ICD-10-CM

## 2023-02-28 LAB — BODY FLUID CULTURE W GRAM STAIN
Culture: NO GROWTH
Gram Stain: NONE SEEN

## 2023-02-28 LAB — CULTURE, BLOOD (ROUTINE X 2)
Culture: NO GROWTH
Culture: NO GROWTH

## 2023-02-28 LAB — KAPPA/LAMBDA LIGHT CHAINS
Kappa free light chain: 29.7 mg/L — ABNORMAL HIGH (ref 3.3–19.4)
Kappa, lambda light chain ratio: 0.13 — ABNORMAL LOW (ref 0.26–1.65)
Lambda free light chains: 233.4 mg/L — ABNORMAL HIGH (ref 5.7–26.3)

## 2023-03-03 ENCOUNTER — Other Ambulatory Visit (INDEPENDENT_AMBULATORY_CARE_PROVIDER_SITE_OTHER): Payer: Self-pay | Admitting: Neurosurgery

## 2023-03-03 ENCOUNTER — Telehealth: Payer: Self-pay | Admitting: Neurosurgery

## 2023-03-03 DIAGNOSIS — Z515 Encounter for palliative care: Secondary | ICD-10-CM

## 2023-03-03 MED ORDER — FENTANYL 50 MCG/HR TD PT72
1.0000 | MEDICATED_PATCH | TRANSDERMAL | 0 refills | Status: DC
Start: 2023-03-03 — End: 2023-03-04

## 2023-03-03 MED ORDER — HYDROMORPHONE HCL 2 MG PO TABS: 1.0000 mg | ORAL_TABLET | Freq: Four times a day (QID) | ORAL | 0 refills | Status: DC | PRN

## 2023-03-03 NOTE — Telephone Encounter (Signed)
Requested medication (s) are due for refill today -yes  Requested medication (s) are on the active medication list -yes  Future visit scheduled -yes  Last refill: clonazepam 11/20/22 #60 2RF                 Zolpidem 11/20/22 #30 2RF  Notes to clinic: non delegated Rx  Requested Prescriptions  Pending Prescriptions Disp Refills   zolpidem (AMBIEN) 10 MG tablet [Pharmacy Med Name: ZOLPIDEM 10MG  TABLETS] 30 tablet     Sig: TAKE 1/2 TO 1 TABLET(5 TO 10 MG) BY MOUTH AT BEDTIME AS NEEDED FOR SLEEP     Not Delegated - Psychiatry:  Anxiolytics/Hypnotics Failed - 02/28/2023 12:10 PM      Failed - This refill cannot be delegated      Passed - Urine Drug Screen completed in last 360 days      Passed - Valid encounter within last 6 months    Recent Outpatient Visits           2 months ago MDD (major depressive disorder), recurrent episode, moderate (HCC)   [place] [facility] [place], Netta Neat, DO   3 months ago Benign essential hypertension   [place] [facility] [place], Netta Neat, DO       Future Appointments             In 1 week Raechel Chute, MD [facility] Pulmonary Care at [place]   In 2 weeks Raechel Chute, MD [facility] Pulmonary Care at [place]   In 2 months Althea Charon, Netta Neat, DO [place] [facility]             clonazePAM (KLONOPIN) 1 MG tablet [Pharmacy Med Name: CLONAZEPAM 1MG  TABLETS] 60 tablet     Sig: TAKE 1 TABLET(1 MG) BY MOUTH TWICE DAILY     Not Delegated - Psychiatry: Anxiolytics/Hypnotics 2 Failed - 02/28/2023 12:10 PM      Failed - This refill cannot be delegated      Passed - Urine Drug Screen completed in last 360 days      Passed - Patient is not pregnant      Passed - Valid encounter within last 6 months    Recent Outpatient Visits           2 months ago MDD (major depressive disorder), recurrent episode, moderate (HCC)   [place] [facility] [place], Netta Neat, DO   3 months ago Benign essential hypertension   [place] [facility] Smitty Cords, DO       Future Appointments             In 1 week Raechel Chute, MD [facility] Pulmonary Care at [place]   In 2 weeks Raechel Chute, MD [facility] Pulmonary Care at [place]   In 2 months Althea Charon, Netta Neat, DO [place] [facility], [facility]               Requested Prescriptions  Pending Prescriptions Disp Refills   zolpidem (AMBIEN) 10 MG tablet [Pharmacy Med Name: ZOLPIDEM 10MG  TABLETS] 30 tablet     Sig: TAKE 1/2 TO 1 TABLET(5 TO 10 MG) BY MOUTH AT BEDTIME AS NEEDED FOR SLEEP     Not Delegated - Psychiatry:  Anxiolytics/Hypnotics Failed - 02/28/2023 12:10 PM      Failed - This refill cannot be delegated      Passed -  Urine Drug Screen completed in last 360 days      Passed - Valid encounter within last 6 months    Recent Outpatient Visits           2 months ago MDD (major depressive disorder), recurrent episode, moderate (HCC)   [place] [facility] [place], Netta Neat, DO   3 months ago Benign essential hypertension   [place] [facility] Smitty Cords, DO       Future Appointments             In 1 week Raechel Chute, MD [facility] Pulmonary Care at [place]   In 2 weeks Raechel Chute, MD [facility] Pulmonary Care at [place]   In 2 months Althea Charon, Netta Neat, DO [place] [facility], [facility]             clonazePAM (KLONOPIN) 1 MG tablet [Pharmacy Med Name: CLONAZEPAM 1MG  TABLETS] 60 tablet     Sig: TAKE 1 TABLET(1 MG) BY MOUTH TWICE DAILY     Not Delegated - Psychiatry: Anxiolytics/Hypnotics 2 Failed - 02/28/2023 12:10 PM      Failed - This refill cannot be delegated      Passed - Urine Drug Screen completed in last 360 days      Passed - Patient  is not pregnant      Passed - Valid encounter within last 6 months    Recent Outpatient Visits           2 months ago MDD (major depressive disorder), recurrent episode, moderate (HCC)   [place] [facility] [place], Netta Neat, DO   3 months ago Benign essential hypertension   [place] [facility] [place], Netta Neat, DO       Future Appointments             In 1 week Raechel Chute, MD [place] [facility] Pulmonary Care at [place]   In 2 weeks Raechel Chute, MD [facility] Pulmonary Care at [place]   In 2 months Althea Charon, Netta Neat, DO  [facility], [facility]

## 2023-03-03 NOTE — Telephone Encounter (Signed)
I notified the patient that the refills have been sent.

## 2023-03-03 NOTE — Progress Notes (Signed)
Patient underwent a major spinal tumor surgery and fusion.  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  Continues to have significant pain.  At this point she is in need of a refill for her pain medications.  Orders have been placed.

## 2023-03-03 NOTE — Telephone Encounter (Signed)
thoracic fusion and biopsy on 01/28/23/  Dilaudid 2 tablets Pain patch She is out of pain medication and patches. She is not sure which provider is refilling her medications since she has been in and out of the hospital. She is out today.

## 2023-03-03 NOTE — Telephone Encounter (Signed)
Patient called back pharmacy is:   [id number] - [place], [place] - [address] AT [facility] [address] [facility]

## 2023-03-03 NOTE — Telephone Encounter (Signed)
Please contact this patient to confirm if she is still taking both of these medications - Zolpidem and Clonazepam.  This patient has had a complicated course in hospital and is due for hospital follow-up apt. I need to make sure she is still on these meds currently at correct dose before refilling them. I know she is on variety of other updated meds as well but we need to do med rec before these are refilled.  Thank you. Let me know  Saralyn Pilar, DO [facility] 03/03/2023, 12:12 PM

## 2023-03-03 NOTE — Telephone Encounter (Signed)
Reorder

## 2023-03-03 NOTE — Telephone Encounter (Signed)
I left a message on the patients voicemail to ask what pharmacy she would like this to go to.   Once we verify the pharmacy we can send this to Dr. Katrinka Blazing.

## 2023-03-04 ENCOUNTER — Inpatient Hospital Stay (HOSPITAL_BASED_OUTPATIENT_CLINIC_OR_DEPARTMENT_OTHER): Payer: Managed Care, Other (non HMO) | Admitting: Oncology

## 2023-03-04 ENCOUNTER — Inpatient Hospital Stay: Payer: Managed Care, Other (non HMO)

## 2023-03-04 ENCOUNTER — Other Ambulatory Visit: Payer: Self-pay

## 2023-03-04 ENCOUNTER — Ambulatory Visit
Admission: RE | Admit: 2023-03-04 | Discharge: 2023-03-04 | Disposition: A | Discharge status: Home / Self Care | Payer: Managed Care, Other (non HMO) | Source: Ambulatory Visit | Attending: Radiation Oncology | Admitting: Radiation Oncology

## 2023-03-04 ENCOUNTER — Other Ambulatory Visit: Payer: Self-pay | Admitting: *Deleted

## 2023-03-04 ENCOUNTER — Encounter: Payer: Self-pay | Admitting: Oncology

## 2023-03-04 ENCOUNTER — Inpatient Hospital Stay (HOSPITAL_BASED_OUTPATIENT_CLINIC_OR_DEPARTMENT_OTHER): Payer: Managed Care, Other (non HMO) | Admitting: Hospice and Palliative Medicine

## 2023-03-04 ENCOUNTER — Encounter: Payer: Self-pay | Admitting: *Deleted

## 2023-03-04 VITALS — BP 148/121 | HR 75 | Temp 98.3°F | Resp 22 | Ht 62.0 in | Wt 210.9 lb

## 2023-03-04 DIAGNOSIS — F17210 Nicotine dependence, cigarettes, uncomplicated: Secondary | ICD-10-CM | POA: Diagnosis not present

## 2023-03-04 DIAGNOSIS — R7989 Other specified abnormal findings of blood chemistry: Secondary | ICD-10-CM

## 2023-03-04 DIAGNOSIS — Z515 Encounter for palliative care: Secondary | ICD-10-CM | POA: Insufficient documentation

## 2023-03-04 DIAGNOSIS — G893 Neoplasm related pain (acute) (chronic): Secondary | ICD-10-CM

## 2023-03-04 DIAGNOSIS — C9000 Multiple myeloma not having achieved remission: Secondary | ICD-10-CM

## 2023-03-04 DIAGNOSIS — D509 Iron deficiency anemia, unspecified: Secondary | ICD-10-CM

## 2023-03-04 DIAGNOSIS — Z9884 Bariatric surgery status: Secondary | ICD-10-CM | POA: Insufficient documentation

## 2023-03-04 DIAGNOSIS — R0602 Shortness of breath: Secondary | ICD-10-CM | POA: Insufficient documentation

## 2023-03-04 DIAGNOSIS — M549 Dorsalgia, unspecified: Secondary | ICD-10-CM | POA: Insufficient documentation

## 2023-03-04 DIAGNOSIS — R29898 Other symptoms and signs involving the musculoskeletal system: Secondary | ICD-10-CM

## 2023-03-04 DIAGNOSIS — D472 Monoclonal gammopathy: Secondary | ICD-10-CM

## 2023-03-04 DIAGNOSIS — Z79899 Other long term (current) drug therapy: Secondary | ICD-10-CM | POA: Insufficient documentation

## 2023-03-04 LAB — CBC WITH DIFFERENTIAL/PLATELET
Abs Immature Granulocytes: 0.13 10*3/uL — ABNORMAL HIGH (ref 0.00–0.07)
Basophils Absolute: 0.1 10*3/uL (ref 0.0–0.1)
Basophils Relative: 1 %
Eosinophils Absolute: 0 10*3/uL (ref 0.0–0.5)
Eosinophils Relative: 0 %
HCT: 24.7 % — ABNORMAL LOW (ref 36.0–46.0)
Hemoglobin: 7.7 g/dL — ABNORMAL LOW (ref 12.0–15.0)
Immature Granulocytes: 2 %
Lymphocytes Relative: 19 %
Lymphs Abs: 1.5 10*3/uL (ref 0.7–4.0)
MCH: 30.6 pg (ref 26.0–34.0)
MCHC: 31.2 g/dL (ref 30.0–36.0)
MCV: 98 fL (ref 80.0–100.0)
Monocytes Absolute: 1.4 10*3/uL — ABNORMAL HIGH (ref 0.1–1.0)
Monocytes Relative: 17 %
Neutro Abs: 5 10*3/uL (ref 1.7–7.7)
Neutrophils Relative %: 61 %
Platelets: 661 10*3/uL — ABNORMAL HIGH (ref 150–400)
RBC: 2.52 MIL/uL — ABNORMAL LOW (ref 3.87–5.11)
RDW: 14.6 % (ref 11.5–15.5)
Smear Review: NORMAL
WBC: 8.2 10*3/uL (ref 4.0–10.5)
nRBC: 0 % (ref 0.0–0.2)

## 2023-03-04 LAB — COMPREHENSIVE METABOLIC PANEL
ALT: 9 U/L (ref 0–44)
AST: 9 U/L — ABNORMAL LOW (ref 15–41)
Albumin: 2.1 g/dL — ABNORMAL LOW (ref 3.5–5.0)
Alkaline Phosphatase: 82 U/L (ref 38–126)
Anion gap: 7 (ref 5–15)
BUN: 27 mg/dL — ABNORMAL HIGH (ref 6–20)
CO2: 30 mmol/L (ref 22–32)
Calcium: 8.4 mg/dL — ABNORMAL LOW (ref 8.9–10.3)
Chloride: 99 mmol/L (ref 98–111)
Creatinine, Ser: 1.94 mg/dL — ABNORMAL HIGH (ref 0.44–1.00)
GFR, Estimated: 29 mL/min — ABNORMAL LOW (ref >60)
Glucose, Bld: 98 mg/dL (ref 70–99)
Potassium: 3.6 mmol/L (ref 3.5–5.1)
Sodium: 136 mmol/L (ref 135–145)
Total Bilirubin: 0.3 mg/dL (ref 0.3–1.2)
Total Protein: 7.3 g/dL (ref 6.5–8.1)

## 2023-03-04 LAB — PREGNANCY, URINE: Preg Test, Ur: NEGATIVE

## 2023-03-04 LAB — SAMPLE TO BLOOD BANK

## 2023-03-04 MED ORDER — SODIUM CHLORIDE 0.9 % IV SOLN
INTRAVENOUS | Status: DC
  Filled 2023-03-04: qty 250

## 2023-03-04 MED ORDER — HYDROMORPHONE HCL 2 MG PO TABS: 2.0000 mg | ORAL_TABLET | Freq: Four times a day (QID) | ORAL | 0 refills | Status: DC | PRN

## 2023-03-04 MED ORDER — MORPHINE SULFATE (PF) 2 MG/ML IV SOLN
2.0000 mg | Freq: Once | INTRAVENOUS | Status: AC
  Administered 2023-03-04: 2 mg via INTRAVENOUS

## 2023-03-04 MED ORDER — FENTANYL 50 MCG/HR TD PT72
1.0000 | MEDICATED_PATCH | TRANSDERMAL | 0 refills | Status: DC
Start: 2023-03-04 — End: 2023-04-07

## 2023-03-04 MED ORDER — MORPHINE SULFATE (PF) 2 MG/ML IV SOLN
4.0000 mg | Freq: Once | INTRAVENOUS | Status: DC
  Filled 2023-03-04: qty 2

## 2023-03-04 NOTE — Telephone Encounter (Signed)
I spoke to patient. She is still taking the Zolpidem and Clonazepam and needs refills of each medication to be sent in please.

## 2023-03-04 NOTE — Progress Notes (Signed)
Palliative Medicine [facility] Telephone:([phone] [phone]   Name: Paula Garrett Date: 03/04/2023 MRN: [id number]  DOB: 15-Aug-1965  Patient Care Team: Smitty Cords, DO as PCP - General (Family Medicine) Lemar Livings, Merrily Pew, MD (General Surgery) Shelia Media, MD (Internal Medicine) Creig Hines, MD as Consulting Physician (Oncology)    REASON FOR CONSULTATION: Paula Garrett is a 57 y.o. female with multiple medical problems including hypertension, depression, anxiety, history of tobacco abuse, and multiple myeloma, who was hospitalized 01/24/2023 to 02/06/2023 with chest pain and shortness of breath and was found to have a large soft tissue mass in the left T6-T7 paraspinal area with destruction of seventh rib and invasion of T6 and T7 vertebral bodies.  Patient underwent surgery on 8/13 with postop period complicated by pain.  Pathology positive for multiple myeloma.  Patient was readmitted 02/10/2023 to 02/21/2023 with AKI.  AKI improved with initiation of myeloma treatment.  Patient was readmitted 02/23/2023 to 02/27/2023 with hypoxic hypercapnic respiratory failure.  Workup consistent with pneumonia.  She was referred to palliative care to address goals and manage ongoing symptoms.  SOCIAL HISTORY:     reports that she has been smoking cigarettes. She started smoking about 42 years ago. She has a 43 pack-year smoking history. She has never used smokeless tobacco. She reports current alcohol use of about 10.0 - 12.0 standard drinks of alcohol per week. She reports that she does not use drugs.  Patient is unmarried and lives at home alone.  She has a son in [place] and a daughter who lives in [place].  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   ADVANCE DIRECTIVES:  Does not have  CODE STATUS:   PAST MEDICAL HISTORY: Past Medical History:  Diagnosis  Date   Anxiety    Arthritis    Asthma    Barrett's esophagus 2015   COVID-19    03/10/20   Depression    GERD (gastroesophageal reflux disease)    Hypertension    Hypothyroidism    Pneumonia    Smoldering myeloma    Thyroid disease     PAST SURGICAL HISTORY:  Past Surgical History:  Procedure Laterality Date   ABLATION  2006   APPLICATION OF INTRAOPERATIVE CT SCAN N/A 01/28/2023   Procedure: APPLICATION OF INTRAOPERATIVE CT SCAN;  Surgeon: Loreen Freud, MD;  Location: [facility] ORS;  Service: Neurosurgery;  Laterality: N/A;   BREAST CYST ASPIRATION Left 1998   BREAST SURGERY     cyst aspiration   BREAST SURGERY  1998   cyst aspiration   COLONOSCOPY  03/2014   COLONOSCOPY WITH PROPOFOL N/A 01/06/2020   Procedure: COLONOSCOPY WITH PROPOFOL;  Surgeon: Toledo, Boykin Nearing, MD;  Location: [facility] ENDOSCOPY;  Service: Gastroenterology;  Laterality: N/A;   ENDOMETRIAL ABLATION     ESOPHAGOGASTRODUODENOSCOPY (EGD) WITH PROPOFOL N/A 08/15/2015   Procedure: ESOPHAGOGASTRODUODENOSCOPY (EGD) WITH PROPOFOL;  Surgeon: Christena Deem, MD;  Location: [facility] ENDOSCOPY;  Service: Endoscopy;  Laterality: N/A;   ESOPHAGOGASTRODUODENOSCOPY (EGD) WITH PROPOFOL N/A 01/22/2016   Procedure: ESOPHAGOGASTRODUODENOSCOPY (EGD) WITH PROPOFOL;  Surgeon: Christena Deem, MD;  Location: [facility] ENDOSCOPY;  Service: Endoscopy;  Laterality: N/A;   ESOPHAGOGASTRODUODENOSCOPY (EGD) WITH PROPOFOL N/A 01/06/2020   Procedure: ESOPHAGOGASTRODUODENOSCOPY (EGD) WITH PROPOFOL;  Surgeon: Toledo, Boykin Nearing, MD;  Location: [facility] ENDOSCOPY;  Service: Gastroenterology;  Laterality: N/A;   GASTRIC BYPASS  2005   HERNIA REPAIR  IR BONE MARROW BIOPSY & ASPIRATION  02/05/2023   NASAL SINUS SURGERY     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   TOTAL THYROIDECTOMY     TUBAL LIGATION      HEMATOLOGY/ONCOLOGY HISTORY:  Oncology History  Multiple myeloma (HCC)  01/25/2023 Initial Diagnosis   Multiple myeloma (HCC)   02/11/2023 -   Chemotherapy   Patient is on Treatment Plan : MYELOMA NEWLY DIAGNOSED TRANSPLANT CANDIDATE Cyclophosphamide IV + Bortezomib SQ + Dexamethasone (CyBorD) q21d x 4 cycles       ALLERGIES:  is allergic to hctz [hydrochlorothiazide].  MEDICATIONS:  Current Outpatient Medications  Medication Sig Dispense Refill   acyclovir (ZOVIRAX) 400 MG tablet Take 1 tablet (400 mg total) by mouth 2 (two) times daily. (Patient not taking: Reported on 03/04/2023) 60 tablet 3   albuterol (VENTOLIN HFA) 108 (90 Base) MCG/ACT inhaler Inhale 2 puffs into the lungs every 6 (six) hours as needed for wheezing or shortness of breath. (Patient not taking: Reported on 03/04/2023) 18 g 11   allopurinol (ZYLOPRIM) 100 MG tablet Take 1 tablet (100 mg total) by mouth daily. (Patient not taking: Reported on 03/04/2023) 30 tablet 0   amLODipine (NORVASC) 10 MG tablet Take 1 tablet (10 mg total) by mouth daily. 90 tablet 3   amoxicillin-clavulanate (AUGMENTIN) 875-125 MG tablet Take 1 tablet by mouth every 12 (twelve) hours for 7 days. 14 tablet 0   aspirin EC 81 MG tablet Take 1 tablet (81 mg total) by mouth daily. Swallow whole. 30 tablet 2   butalbital-acetaminophen-caffeine (FIORICET) 50-325-40 MG tablet Take 1 tablet by mouth every 4 (four) hours as needed for headache. 14 tablet 0   clonazePAM (KLONOPIN) 1 MG tablet TAKE 1 TABLET(1 MG) BY MOUTH TWICE DAILY 60 tablet 2   cyanocobalamin (VITAMIN B12) 1000 MCG/ML injection INJECT 1 ML IN THE MUSCLE EVERY 30 DAYS (Patient not taking: Reported on 03/04/2023) 4 mL 12   cyclobenzaprine (FLEXERIL) 10 MG tablet Take 0.5-1 tablets (5-10 mg total) by mouth 3 (three) times daily as needed for muscle spasms. 60 tablet 1   dexamethasone (DECADRON) 4 MG tablet Take 10 tablets (40 mg) on Day 15 of each cycle. Repeat every 21 days.Take with breakfast. 10 tablet 3   docusate sodium (COLACE) 100 MG capsule Take 1 capsule (100 mg total) by mouth 2 (two) times daily. 10 capsule 0   escitalopram  (LEXAPRO) 10 MG tablet Take 1 tablet (10 mg total) by mouth daily. 30 tablet 2   fentaNYL (DURAGESIC) 50 MCG/HR Place 1 patch onto the skin every 3 (three) days. 2 patch 0   gabapentin (NEURONTIN) 100 MG capsule Take 1 capsule (100 mg total) by mouth 3 (three) times daily. 30 capsule 0   HYDROmorphone (DILAUDID) 2 MG tablet Take 0.5-1 tablets (1-2 mg total) by mouth every 6 (six) hours as needed for severe pain. 20 tablet 0   ipratropium-albuterol (DUONEB) 0.5-2.5 (3) MG/3ML SOLN Take 3 mLs by nebulization 4 (four) times daily. 360 mL 0   levothyroxine (SYNTHROID) 125 MCG tablet Take 2 tablets (250 mcg total) by mouth daily before breakfast. 60 tablet 0   mometasone-formoterol (DULERA) 200-5 MCG/ACT AERO Inhale 2 puffs into the lungs 2 (two) times daily. 1 each 2   ondansetron (ZOFRAN) 8 MG tablet Take 1 tablet (8 mg total) by mouth 2 (two) times daily as needed for nausea or vomiting. Start on day 3 after Cytoxan. (Patient not taking: Reported on 03/04/2023) 30 tablet 1  pantoprazole (PROTONIX) 40 MG tablet 30 min before lunch or dinner 90 tablet 3   polyethylene glycol (MIRALAX / GLYCOLAX) 17 g packet Take 17 g by mouth 2 (two) times daily. (Patient not taking: Reported on 03/04/2023) 14 each 0   prochlorperazine (COMPAZINE) 10 MG tablet Take 1 tablet (10 mg total) by mouth every 6 (six) hours as needed for nausea or vomiting. (Patient not taking: Reported on 03/04/2023) 30 tablet 1   sodium bicarbonate 650 MG tablet Take 1 tablet (650 mg total) by mouth 3 (three) times daily. 90 tablet 0   Spacer/Aero-Holding Chambers (AEROCHAMBER MV) inhaler Use as instructed 1 each 0   SYRINGE-NEEDLE, DISP, 3 ML 25G X 1-1/2" 3 ML MISC 1 Device by Does not apply route every 30 (thirty) days. With B12 shot 12 each 1   thiamine (VITAMIN B-1) 100 MG tablet Take 1 tablet (100 mg total) by mouth daily. (Patient not taking: Reported on 03/04/2023) 30 tablet 0   umeclidinium bromide (INCRUSE ELLIPTA) 62.5 MCG/ACT AEPB  Inhale 1 puff into the lungs daily. 30 each 0   Vitamin D, Ergocalciferol, (DRISDOL) 1.25 MG (50000 UNIT) CAPS capsule Take 50,000 Units by mouth every 7 (seven) days.     zolpidem (AMBIEN) 10 MG tablet TAKE 1/2 TO 1 TABLET(5 TO 10 MG) BY MOUTH AT BEDTIME AS NEEDED FOR SLEEP 30 tablet 2   No current facility-administered medications for this visit.   Facility-Administered Medications Ordered in Other Visits  Medication Dose Route Frequency Provider Last Rate Last Admin   0.9 %  sodium chloride infusion   Intravenous Continuous Creig Hines, MD       morphine (PF) 2 MG/ML injection 2 mg  2 mg Intravenous Once Creig Hines, MD       morphine (PF) 2 MG/ML injection 4 mg  4 mg Intravenous Once Creig Hines, MD        VITAL SIGNS: There were no vitals taken for this visit. There were no vitals filed for this visit.  Estimated body mass index is 38.57 kg/m as calculated from the following:   Height as of an earlier encounter on 03/04/23: 5\' 2"  (1.575 m).   Weight as of an earlier encounter on 03/04/23: 210 lb 14.4 oz (95.7 kg).  LABS: CBC:    Component Value Date/Time   WBC 8.2 03/04/2023 1334   HGB 7.7 (L) 03/04/2023 1334   HGB 7.4 (L) 02/10/2023 2009   HCT 24.7 (L) 03/04/2023 1334   HCT 38.5 07/02/2011 1529   PLT 661 (H) 03/04/2023 1334   PLT 311 07/02/2011 1529   MCV 98.0 03/04/2023 1334   MCV 90 07/02/2011 1529   NEUTROABS PENDING 03/04/2023 1334   LYMPHSABS PENDING 03/04/2023 1334   MONOABS PENDING 03/04/2023 1334   EOSABS PENDING 03/04/2023 1334   BASOSABS PENDING 03/04/2023 1334   Comprehensive Metabolic Panel:    Component Value Date/Time   NA 136 03/04/2023 1334   K 3.6 03/04/2023 1334   CL 99 03/04/2023 1334   CO2 30 03/04/2023 1334   BUN 27 (H) 03/04/2023 1334   CREATININE 1.94 (H) 03/04/2023 1334   GLUCOSE 98 03/04/2023 1334   CALCIUM 8.4 (L) 03/04/2023 1334   AST 9 (L) 03/04/2023 1334   ALT 9 03/04/2023 1334   ALKPHOS 82 03/04/2023 1334   BILITOT 0.3  03/04/2023 1334   PROT 7.3 03/04/2023 1334   ALBUMIN 2.1 (L) 03/04/2023 1334    RADIOGRAPHIC STUDIES: DG Chest Port 1 View  Result  Date: 02/25/2023 CLINICAL DATA:  Status post left-sided thoracentesis EXAM: PORTABLE CHEST 1 VIEW COMPARISON:  Chest x-ray 02/23/2023 FINDINGS: There is a moderate-sized left pleural effusion. Heart is enlarged. There central pulmonary vascular congestion. Can not exclude underlying airspace disease in the left lung base. Thoracic spinal fusion hardware is present. IMPRESSION: 1. Moderate-sized left pleural effusion. 2. No evidence for a pneumothorax. 3. Cardiomegaly with central pulmonary vascular congestion. Electronically Signed   By: Darliss Cheney M.D.   On: 02/25/2023 01:43   US THORACENTESIS ASP PLEURAL SPACE W/IMG GUIDE  Result Date: 02/24/2023 INDICATION: 57 year old female. 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 Admitted for altered mental status. Found to have a left-sided pleural effusion. Team is requesting a thoracentesis for further evaluation EXAM: ULTRASOUND GUIDED LEFT-SIDED THERAPEUTIC AND DIAGNOSTIC THORACENTESIS MEDICATIONS: Lidocaine 1% 10 mL COMPLICATIONS: None immediate. PROCEDURE: An ultrasound guided thoracentesis was thoroughly discussed with the patient and questions answered. The benefits, risks, alternatives and complications were also discussed. The patient understands and wishes to proceed with the procedure. Written consent was obtained. Ultrasound was performed to localize and mark an adequate pocket of fluid in the left chest. The area was then prepped and draped in the normal sterile fashion. 1% Lidocaine was used for local anesthesia. Under ultrasound guidance a 6 Fr Safe-T-Centesis catheter was introduced. Thoracentesis was performed. The catheter was removed and a dressing applied. FINDINGS: A total of approximately 400 mL of serosanguineous fluid was removed. Samples were sent to the  laboratory as requested by the clinical team. IMPRESSION: Successful ultrasound guided left-sided therapeutic thoracentesis yielding of pleural fluid. Performed by: Anders Grant, NP Electronically Signed   By: Olive Bass M.D.   On: 02/24/2023 16:21   CT Angio Chest PE W/Cm &/Or Wo Cm  Result Date: 02/23/2023 CLINICAL DATA:  Pulmonary embolism (PE) suspected, high prob l sided opacity on CXR EXAM: CT ANGIOGRAPHY CHEST WITH CONTRAST TECHNIQUE: Multidetector CT imaging of the chest was performed using the standard protocol during bolus administration of intravenous contrast. Multiplanar CT image reconstructions and MIPs were obtained to evaluate the vascular anatomy. RADIATION DOSE REDUCTION: This exam was performed according to the departmental dose-optimization program which includes automated exposure control, adjustment of the mA and/or kV according to patient size and/or use of iterative reconstruction technique. CONTRAST:  75mL OMNIPAQUE IOHEXOL 350 MG/ML SOLN COMPARISON:  CT chest in 01/24/2023, PET CT 02/12/2023 FINDINGS: Cardiovascular: Poor opacification of the pulmonary arteries due to timing of contrast. No evidence of central pulmonary embolism. The main pulmonary artery is enlarged in caliber measuring up to 3.6 cm. Normal heart size. No significant pericardial effusion. The thoracic aorta is normal in caliber. Mild atherosclerotic plaque of the thoracic aorta. Four-vessel coronary artery calcifications. Mediastinum/Nodes: Interval development of an enlarged right hilar lymph node measuring to 1.8 cm. No enlarged mediastinal, left hilar, or axillary lymph nodes. Thyroid gland, trachea, and esophagus demonstrate no significant findings. Lungs/Pleura: Partial collapse of left lower lobe. Interval worsening of patchy airspace opacities of the left lower lobe and lingula. Interval increase in size and number right upper lobe cluster of spiculated nodularities with nodule measuring up to 1.3 cm.  Persistent scattered pulmonary nodules within the right lower lobe. Similar findings within the right middle lobe. No pulmonary mass. Interval increase in small to moderate volume left pleural effusion . No pneumothorax. Upper Abdomen: Stable 2.8 cm right hepatic hypodensity. Gastric surgical changes with small hiatal hernia. Musculoskeletal: No chest wall abnormality. No acute displaced fracture.  T4-T9 posterolateral surgical hardware in the setting of a lytic lesion involving the majority of the T6-T7 levels. Associated similar-appearing 9 x 4.5 cm (5:64) soft tissue mass along the left paraspinal resection bed. Redemonstration of resorption of the left posterior seventh rib. Review of the MIP images confirms the above findings. IMPRESSION: 1. Poor opacification of the pulmonary arteries due to timing of contrast. No central pulmonary embolus. Limited evaluation more distally due to timing of contrast. 2. Enlarged main pulmonary artery-correlate for pulmonary hypertension. 3. Partial collapse of left lower lobe. Interval worsening of patchy airspace opacities of the left lower lobe and lingula. Findings may represent a combination of atelectasis as well as infection/inflammation. 4. Interval increase in small to moderate volume left pleural effusion . 5. Interval increase in size and number of indeterminate scattered pulmonary nodules within the right lung with largest measuring up to 1.3 cm in the upper lobes. 6. Interval development of an enlarged right hilar lymph node measuring to 1.8 cm. Findings likely reactive in etiology. Recommend attention on follow-up. 7. Redemonstration of lytic lesion involving the majority of the T6-T7 levels with associated similar-appearing 9 x 4.5 cm soft tissue mass along the left paraspinal resection bed. Redemonstration of resorption of the left posterior seventh rib. 8.  Aortic Atherosclerosis (ICD10-I70.0). Electronically Signed   By: Tish Frederickson M.D.   On: 02/23/2023  22:42   CT HEAD WO CONTRAST ( )  Result Date: 02/23/2023 CLINICAL DATA:  Altered mental status EXAM: CT HEAD WITHOUT CONTRAST TECHNIQUE: Contiguous axial images were obtained from the base of the skull through the vertex without intravenous contrast. RADIATION DOSE REDUCTION: This exam was performed according to the departmental dose-optimization program which includes automated exposure control, adjustment of the mA and/or kV according to patient size and/or use of iterative reconstruction technique. COMPARISON:  02/03/2023 FINDINGS: Brain: There is no mass, hemorrhage or extra-axial collection. The size and configuration of the ventricles and extra-axial CSF spaces are normal. The brain parenchyma is normal, without acute or chronic infarction. Vascular: No abnormal hyperdensity of the major intracranial arteries or dural venous sinuses. No intracranial atherosclerosis. Skull: The visualized skull base, calvarium and extracranial soft tissues are normal. Sinuses/Orbits: No fluid levels or advanced mucosal thickening of the visualized paranasal sinuses. No mastoid or middle ear effusion. The orbits are normal. IMPRESSION: Normal head CT. Electronically Signed   By: Deatra Robinson M.D.   On: 02/23/2023 22:30   DG Chest Port 1 View  Result Date: 02/23/2023 CLINICAL DATA:  Shortness of breath EXAM: PORTABLE CHEST 1 VIEW COMPARISON:  02/18/2023 FINDINGS: Stable cardiomegaly. Moderate left pleural effusion with left basilar opacity. Increasing opacity in the left upper lobe. Interstitial prominence in the right lung. No right-sided pleural effusion. No pneumothorax. IMPRESSION: 1. Moderate left pleural effusion with left basilar opacity, which may represent atelectasis or pneumonia. Increasing opacity in the left upper lobe. 2. Interstitial prominence in the right lung, which may reflect edema. Electronically Signed   By: Duanne Guess D.O.   On: 02/23/2023 20:18   DG Chest Port 1 View  Result Date:  02/18/2023 CLINICAL DATA:  History of multiple myeloma and pulmonary embolus. EXAM: PORTABLE CHEST 1 VIEW COMPARISON:  February 16, 2023. FINDINGS: Stable cardiomegaly. Continued presence of wedge-shaped consolidation within the left midlung concerning for pneumonia or infarction as noted on prior exam. Mild central pulmonary vascular congestion is noted. Left basilar atelectasis or infiltrate and effusion cannot be excluded. Postsurgical changes are seen in thoracic spine. IMPRESSION: Continued presence wedge-shaped  consolidation with left midlung concerning for pneumonia or infarction as noted on prior exam. Mild central pulmonary vascular congestion. Left basilar atelectasis or infiltrate and pleural effusion cannot be excluded. Electronically Signed   By: Lupita Raider M.D.   On: 02/18/2023 12:43   NM Pulmonary Perfusion  Result Date: 02/18/2023 CLINICAL DATA:  Concern for pulmonary embolism.  Short of breath. EXAM: NUCLEAR MEDICINE PERFUSION LUNG SCAN TECHNIQUE: Perfusion images were obtained in multiple projections after intravenous injection of radiopharmaceutical. RADIOPHARMACEUTICALS:  4.5 mCi Tc-55m MAA COMPARISON:  Chest radiograph 02/18/2023 FINDINGS: Marked decreased relative perfusion to the entire LEFT lung is favored related to attenuation artifact from large LEFT effusion seen on comparison radiograph. No wedge-shaped peripheral perfusion defects within LEFT or RIGHT lung to suggest acute pulmonary embolism. IMPRESSION: 1. No evidence acute pulmonary embolism. 2. Decreased relative activity in the LEFT lung is favor related to large LEFT effusion. Electronically Signed   By: Genevive Bi M.D.   On: 02/18/2023 12:40   DG Chest Port 1 View  Result Date: 02/16/2023 CLINICAL DATA:  Fever of unknown origin, history of multiple myeloma EXAM: PORTABLE CHEST 1 VIEW COMPARISON:  02/10/2023, 02/12/2023 FINDINGS: Single frontal view of the chest demonstrates stable enlargement the cardiac  silhouette. Chronic vascular congestion. Nodular areas of consolidation are again seen bilaterally, unchanged since recent PET scan. There is a new peripheral area of wedge-shaped consolidation within the left midlung, measuring up to 6.3 cm. Small left pleural effusion. No pneumothorax. Postsurgical changes from thoracic fusion. Destructive lesions of the midthoracic spine are not well visualized on this x-ray. IMPRESSION: 1. New peripheral area of wedge-shaped consolidation within the left mid lung, most consistent with focal pneumonia or pulmonary infarct given rapid development since recent PET CT. 2. Small left pleural effusion. 3. Nodular areas of consolidation bilaterally, which may be inflammatory or infectious. No change since recent PET scan. 4. Chronic vascular congestion. Electronically Signed   By: Sharlet Salina M.D.   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 EXAM: NUCLEAR MEDICINE PET WHOLE BODY TECHNIQUE: 10.1 mCi F-18 FDG was injected intravenously. Full-ring PET imaging was performed from the head to foot after the radiotracer. CT data was obtained and used for attenuation correction and anatomic localization. Fasting blood glucose: 100 mg/dl COMPARISON:  CT chest 65/78/4696 FINDINGS: Mediastinal blood pool activity: SUV max HEAD/NECK: No hypermetabolic activity in the scalp. No hypermetabolic cervical lymph nodes. Incidental CT findings: none CHEST: At the LEFT paraspinal resection site (T6-T7), there is residual fluid and soft tissue density tissue measuring 6.1 x 3.7 cm. This tissue has no significant metabolic activity. Activity is equal to background blood pool activity. Posterior fusion at this level. No hypermetabolic tissue within the thorax to suggest malignancy within the bones or soft tissue. There are  several small nodules in the LEFT lung apex (image 54/6) without metabolic activity. These are new from comparison CT 01/24/2023 and therefore favored foci of infection or inflammation. Small bilateral pleural effusions are present greater on the LEFT. Incidental CT findings: none ABDOMEN/PELVIS: There is moderate metabolic activity through the GE junction. There is postsurgical anatomy of the proximal stomach. No abnormal metabolic activity liver. Scattered physiologic activity in the bowel. No hypermetabolic lymph nodes in the abdomen pelvis. Incidental CT findings: Foley catheter in bladder. SKELETON: Hypermetabolic lesion in the L4 vertebral body  with SUV max equal 5.4. associated lytic lesion measuring 19 mm on image 121/series 6. EXTREMITIES: No abnormal hypermetabolic activity in the lower extremities. Incidental CT findings: No sclerotic or lytic lesions on CT imaging. IMPRESSION: 1. Solitary hypermetabolic lytic lesion in the L4 vertebral body consistent multiple myeloma. Metabolic activity is moderate. 2. Residual soft tissue density at the LEFT paraspinal resection site (T6-T7) with no significant metabolic activity. Favor benign postsurgical tissue. 3. Small bilateral pleural effusions greater on the LEFT. 4. Small nodules in the LEFT lung apex without metabolic activity are favored foci of infection or inflammation. 5. Moderate metabolic activity through the GE junction is favored physiologic or inflammatory. Electronically Signed   By: Genevive Bi M.D.   On: 02/12/2023 13:47   US RENAL  Result Date: 02/10/2023 CLINICAL DATA:  AKI EXAM: RENAL / URINARY TRACT ULTRASOUND COMPLETE COMPARISON:  None Available. FINDINGS: Right Kidney: Renal measurements: 12.5 cm x 5.6 cm x 4.6 cm = volume: 165 mL. Parenchymal echogenicity is normal. There is no hydronephrosis. Left Kidney: Renal measurements: 12.6 cm x 6.4 cm x 5.0 cm = volume: 213 mL. Parenchymal echogenicity is normal. There is no hydronephrosis.  Bladder: Decompressed by a Foley catheter. Other: None. IMPRESSION: Unremarkable renal ultrasound. Electronically Signed   By: Lesia Hausen M.D.   On: 02/10/2023 17:03   DG Chest 1 View  Result Date: 02/10/2023 CLINICAL DATA:  Acute kidney injury. Sodium 120. Elevated creatinine. EXAM: CHEST  1 VIEW COMPARISON:  02/01/2023 FINDINGS: Shallow inspiration. Cardiac enlargement. Hazy interstitial infiltrates throughout both lungs most likely representing edema although possibly interstitial pneumonia or chronic fibrosis. Small left pleural effusion with basilar atelectasis or consolidation. This could indicate pneumonia in the appropriate clinical setting. No pneumothorax. Postoperative changes in the thoracic spine. No significant change since prior study. IMPRESSION: Cardiac enlargement with probable interstitial pulmonary edema. Small left pleural effusion with basilar atelectasis or consolidation. Similar appearance to previous study. Electronically Signed   By: Burman Nieves M.D.   On: 02/10/2023 15:50   IR BONE MARROW BIOPSY & ASPIRATION  Result Date: 02/05/2023 INDICATION: Hematologic abnormality EXAM: Bone marrow aspiration and core biopsy using fluoroscopic guidance MEDICATIONS: None. ANESTHESIA/SEDATION: Moderate (conscious) sedation was employed during this procedure. A total of Versed 1.5 mg and Fentanyl 75 mcg was administered intravenously. Moderate Sedation Time: 10 minutes. The patient's level of consciousness and vital signs were monitored continuously by radiology nursing throughout the procedure under my direct supervision. FLUOROSCOPY TIME:  Fluoroscopy Time: 0.7 minutes (13 mGy) COMPLICATIONS: None immediate. PROCEDURE: Informed written consent was obtained from the patient after a thorough discussion of the procedural risks, benefits and alternatives. All questions were addressed. Maximal Sterile Barrier Technique was utilized including caps, mask, sterile gowns, sterile gloves, sterile  drape, hand hygiene and skin antiseptic. A timeout was performed prior to the initiation of the procedure. The patient was placed prone on the exam table. Limited fluoroscopy of the pelvis was performed for planning purposes. Skin entry site was marked, and the overlying skin was prepped and draped in the standard sterile fashion. Local analgesia was obtained with 1% lidocaine. Using fluoroscopic guidance, an 11 gauge needle was advanced just deep to the cortex of the right posterior ilium. Subsequently, bone marrow aspiration and core biopsy were performed. Specimens were submitted to lab/pathology for handling. Hemostasis was achieved with manual pressure, and a clean dressing was placed. The patient tolerated the procedure well without immediate complication. IMPRESSION: Successful bone marrow aspiration and core biopsy of the right posterior  ilium. Electronically Signed   By: Olive Bass M.D.   On: 02/05/2023 15:35   CT HEAD WO CONTRAST ( )  Result Date: 02/03/2023 CLINICAL DATA:  Mental status change, unknown cause. Acute hypoxic respiratory failure EXAM: CT HEAD WITHOUT CONTRAST TECHNIQUE: Contiguous axial images were obtained from the base of the skull through the vertex without intravenous contrast. RADIATION DOSE REDUCTION: This exam was performed according to the departmental dose-optimization program which includes automated exposure control, adjustment of the mA and/or kV according to patient size and/or use of iterative reconstruction technique. COMPARISON:  Bone survey 10/09/2022. FINDINGS: Brain: No acute intracranial abnormality. Specifically, no hemorrhage, hydrocephalus, mass lesion, acute infarction, or significant intracranial injury. Vascular: No hyperdense vessel or unexpected calcification. Skull: Focal lucent lesions throughout the skull. 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 These lytic lesions were not in the skull on the skull x-ray from  10/09/2022. Appearance is concerning for possible multiple myeloma. No fracture. Sinuses/Orbits: No acute findings Other: None IMPRESSION: No acute intracranial abnormality. Numerous scattered focal lucent lesions throughout the skull. These are new since prior bone survey from 10/09/2022 and concerning for multiple myeloma. Electronically Signed   By: Charlett Nose M.D.   On: 02/03/2023 16:30    PERFORMANCE STATUS (ECOG) : 1 - Symptomatic but completely ambulatory  Review of Systems Unless otherwise noted, a complete review of systems is negative.  Physical Exam General: NAD Cardiovascular: regular rate and rhythm Pulmonary: clear ant fields Abdomen: soft, nontender, + bowel sounds GU: no suprapubic tenderness Extremities: no edema, no joint deformities Skin: no rashes Neurological: Weakness but otherwise nonfocal  IMPRESSION: Patient seen today for posthospital follow-up.  She endorses severe back pain, which appears to have worsened in severity over the past several days.  Patient tearful.  She is accompanied by her daughter-in-law from [place].  Patient has been off transdermal fentanyl for several days as family has had difficulty obtaining it from the pharmacy.  Will have nursing call pharmacy and will resend prescription.  Patient given dose of IV morphine in clinic today.  Will liberalize her hydromorphone for breakthrough pain.  Patient was seen by radiation oncology today with plan for XRT to spine.  Hopefully, this will also help improve her symptoms.  Discussed constipation management.  Patient tearful and depressed appearing.  Will send referral to [facility].   She is concerned about people staying in her home and states a strong desire to remain independent.  We did discuss that patient appears that she would greatly benefit from ongoing family support.  PLAN: -Continue current scope of treatment -Refill transdermal fentanyl #5 -Increase oral hydromorphone 2 to 4 mg  every 4 hours as needed #60 -Daily bowel regimen with senna/MiraLAX -Referral to [facility], SW, Nutrition, and for rehab screening -RTC later this week for repeat labs/possible fluids  Case and plan discussed with Dr. Smith Robert   Patient expressed understanding and was in agreement with this plan. She also understands that She can call the clinic at any time with any questions, concerns, or complaints.     Time Total: 25 minutes  Visit consisted of counseling and education dealing with the complex and emotionally intense issues of symptom management and palliative care in the setting of serious and potentially life-threatening illness.Greater than 50%  of this time was spent counseling and coordinating care related to the above assessment and plan.  Signed by: Laurette Schimke, PhD, NP-C

## 2023-03-04 NOTE — Consult Note (Signed)
NEW PATIENT EVALUATION  Name: Paula Garrett  MRN: [id number]  Date:   03/04/2023     DOB: Nov 28, 1965   This 57 y.o. 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  REFERRING PHYSICIAN: Saralyn Pilar *  CHIEF COMPLAINT: No chief complaint on file.   DIAGNOSIS: Multiple myeloma   PREVIOUS INVESTIGATIONS:  CT scans PET CT scan reviewed Pathology reports reviewed Clinical notes reviewed  HPI: Patient is a 57 year old female with multiple medical comorbidities including hypertension depression anxiety and multiple myeloma.  In August she was found to have a large soft tissue mass in the left T6-T7 paraspinal area with destruction of the seventh rib and invasion of T6 and T7.  She underwent surgery with T4-9 posterior spinal fusion and T6-7 laminotomies.  Her postoperative course was complicated by hypoxic hypercapnic respiratory failure consistent with pneumonia.  She did receive was started on Cytoxan by medical oncology and has had pancytopenia.  She continues to have significant back pain.  Recent CT scan of her chest shows enlarged main pulmonary artery correlated for pulmonary hypertension.  Partial collapse of the left lower lobe.  Also was noted lytic destruction involving majority of the T6-T7 level levels with associated similar appearing 9 x 4.5 cm soft tissue mass along the left paraspinal resection bed.  I been asked to evaluate her for possible palliative radiation therapy.  Seen today needing narcotics for pain control.  She is having no focal neurologic deficits.  PLANNED TREATMENT REGIMEN: Palliative radiation therapy to spine  PAST MEDICAL HISTORY:  has a past medical history of Anxiety, Arthritis, Asthma, Barrett's esophagus (2015), COVID-19, Depression, GERD (gastroesophageal reflux disease), Hypertension, Hypothyroidism, Pneumonia, Smoldering myeloma, and Thyroid disease.    PAST SURGICAL HISTORY:   Past Surgical History:  Procedure Laterality Date   ABLATION  2006   APPLICATION OF INTRAOPERATIVE CT SCAN N/A 01/28/2023   Procedure: APPLICATION OF INTRAOPERATIVE CT SCAN;  Surgeon: Loreen Freud, MD;  Location: [facility] ORS;  Service: Neurosurgery;  Laterality: N/A;   BREAST CYST ASPIRATION Left 1998   BREAST SURGERY     cyst aspiration   BREAST SURGERY  1998   cyst aspiration   COLONOSCOPY  03/2014   COLONOSCOPY WITH PROPOFOL N/A 01/06/2020   Procedure: COLONOSCOPY WITH PROPOFOL;  Surgeon: Toledo, Boykin Nearing, MD;  Location: [facility] ENDOSCOPY;  Service: Gastroenterology;  Laterality: N/A;   ENDOMETRIAL ABLATION     ESOPHAGOGASTRODUODENOSCOPY (EGD) WITH PROPOFOL N/A 08/15/2015   Procedure: ESOPHAGOGASTRODUODENOSCOPY (EGD) WITH PROPOFOL;  Surgeon: Christena Deem, MD;  Location: [facility] ENDOSCOPY;  Service: Endoscopy;  Laterality: N/A;   ESOPHAGOGASTRODUODENOSCOPY (EGD) WITH PROPOFOL N/A 01/22/2016   Procedure: ESOPHAGOGASTRODUODENOSCOPY (EGD) WITH PROPOFOL;  Surgeon: Christena Deem, MD;  Location: [facility] ENDOSCOPY;  Service: Endoscopy;  Laterality: N/A;   ESOPHAGOGASTRODUODENOSCOPY (EGD) WITH PROPOFOL N/A 01/06/2020   Procedure: ESOPHAGOGASTRODUODENOSCOPY (EGD) WITH PROPOFOL;  Surgeon: Toledo, Boykin Nearing, MD;  Location: [facility] ENDOSCOPY;  Service: Gastroenterology;  Laterality: N/A;   GASTRIC BYPASS  2005   HERNIA REPAIR     IR BONE MARROW BIOPSY & ASPIRATION  02/05/2023   NASAL SINUS SURGERY     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   TOTAL THYROIDECTOMY     TUBAL LIGATION      FAMILY HISTORY: family history includes Alcohol abuse in her father; Anxiety disorder in her brother and mother; Arthritis in her father and mother; COPD in her father and mother; Cancer in her  brother and mother; Depression in her brother and mother; Diabetes in her maternal grandfather, maternal grandmother, paternal grandfather, and paternal grandmother; Drug abuse in her brother and mother; Heart attack in her  brother and father; Heart disease in her brother, brother, father, and mother; Lupus in her mother.  SOCIAL HISTORY:  reports that she has been smoking cigarettes. She started smoking about 42 years ago. She has a 43 pack-year smoking history. She has never used smokeless tobacco. She reports current alcohol use of about 10.0 - 12.0 standard drinks of alcohol per week. She reports that she does not use drugs.  ALLERGIES: Hctz [hydrochlorothiazide]  MEDICATIONS:  Current Outpatient Medications  Medication Sig Dispense Refill   acyclovir (ZOVIRAX) 400 MG tablet Take 1 tablet (400 mg total) by mouth 2 (two) times daily. (Patient not taking: Reported on 03/04/2023) 60 tablet 3   albuterol (VENTOLIN HFA) 108 (90 Base) MCG/ACT inhaler Inhale 2 puffs into the lungs every 6 (six) hours as needed for wheezing or shortness of breath. (Patient not taking: Reported on 03/04/2023) 18 g 11   allopurinol (ZYLOPRIM) 100 MG tablet Take 1 tablet (100 mg total) by mouth daily. (Patient not taking: Reported on 03/04/2023) 30 tablet 0   amLODipine (NORVASC) 10 MG tablet Take 1 tablet (10 mg total) by mouth daily. 90 tablet 3   amoxicillin-clavulanate (AUGMENTIN) 875-125 MG tablet Take 1 tablet by mouth every 12 (twelve) hours for 7 days. 14 tablet 0   aspirin EC 81 MG tablet Take 1 tablet (81 mg total) by mouth daily. Swallow whole. 30 tablet 2   butalbital-acetaminophen-caffeine (FIORICET) 50-325-40 MG tablet Take 1 tablet by mouth every 4 (four) hours as needed for headache. 14 tablet 0   clonazePAM (KLONOPIN) 1 MG tablet TAKE 1 TABLET(1 MG) BY MOUTH TWICE DAILY 60 tablet 2   cyanocobalamin (VITAMIN B12) 1000 MCG/ML injection INJECT 1 ML IN THE MUSCLE EVERY 30 DAYS (Patient not taking: Reported on 03/04/2023) 4 mL 12   cyclobenzaprine (FLEXERIL) 10 MG tablet Take 0.5-1 tablets (5-10 mg total) by mouth 3 (three) times daily as needed for muscle spasms. 60 tablet 1   dexamethasone (DECADRON) 4 MG tablet Take 10 tablets  (40 mg) on Day 15 of each cycle. Repeat every 21 days.Take with breakfast. 10 tablet 3   docusate sodium (COLACE) 100 MG capsule Take 1 capsule (100 mg total) by mouth 2 (two) times daily. 10 capsule 0   escitalopram (LEXAPRO) 10 MG tablet Take 1 tablet (10 mg total) by mouth daily. 30 tablet 2   fentaNYL (DURAGESIC) 50 MCG/HR Place 1 patch onto the skin every 3 (three) days. 2 patch 0   gabapentin (NEURONTIN) 100 MG capsule Take 1 capsule (100 mg total) by mouth 3 (three) times daily. 30 capsule 0   HYDROmorphone (DILAUDID) 2 MG tablet Take 0.5-1 tablets (1-2 mg total) by mouth every 6 (six) hours as needed for severe pain. 20 tablet 0   ipratropium-albuterol (DUONEB) 0.5-2.5 (3) MG/3ML SOLN Take 3 mLs by nebulization 4 (four) times daily. 360 mL 0   levothyroxine (SYNTHROID) 125 MCG tablet Take 2 tablets (250 mcg total) by mouth daily before breakfast. 60 tablet 0   mometasone-formoterol (DULERA) 200-5 MCG/ACT AERO Inhale 2 puffs into the lungs 2 (two) times daily. 1 each 2   ondansetron (ZOFRAN) 8 MG tablet Take 1 tablet (8 mg total) by mouth 2 (two) times daily as needed for nausea or vomiting. Start on day 3 after Cytoxan. (Patient not taking: Reported on  03/04/2023) 30 tablet 1   pantoprazole (PROTONIX) 40 MG tablet 30 min before lunch or dinner 90 tablet 3   polyethylene glycol (MIRALAX / GLYCOLAX) 17 g packet Take 17 g by mouth 2 (two) times daily. (Patient not taking: Reported on 03/04/2023) 14 each 0   prochlorperazine (COMPAZINE) 10 MG tablet Take 1 tablet (10 mg total) by mouth every 6 (six) hours as needed for nausea or vomiting. (Patient not taking: Reported on 03/04/2023) 30 tablet 1   sodium bicarbonate 650 MG tablet Take 1 tablet (650 mg total) by mouth 3 (three) times daily. 90 tablet 0   Spacer/Aero-Holding Chambers (AEROCHAMBER MV) inhaler Use as instructed 1 each 0   SYRINGE-NEEDLE, DISP, 3 ML 25G X 1-1/2" 3 ML MISC 1 Device by Does not apply route every 30 (thirty) days. With B12  shot 12 each 1   thiamine (VITAMIN B-1) 100 MG tablet Take 1 tablet (100 mg total) by mouth daily. (Patient not taking: Reported on 03/04/2023) 30 tablet 0   umeclidinium bromide (INCRUSE ELLIPTA) 62.5 MCG/ACT AEPB Inhale 1 puff into the lungs daily. 30 each 0   Vitamin D, Ergocalciferol, (DRISDOL) 1.25 MG (50000 UNIT) CAPS capsule Take 50,000 Units by mouth every 7 (seven) days.     zolpidem (AMBIEN) 10 MG tablet TAKE 1/2 TO 1 TABLET(5 TO 10 MG) BY MOUTH AT BEDTIME AS NEEDED FOR SLEEP 30 tablet 2   No current facility-administered medications for this encounter.   Facility-Administered Medications Ordered in Other Encounters  Medication Dose Route Frequency Provider Last Rate Last Admin   0.9 %  sodium chloride infusion   Intravenous Continuous Creig Hines, MD 20 mL/hr at 03/04/23 1437 New Bag at 03/04/23 1437   morphine (PF) 2 MG/ML injection 4 mg  4 mg Intravenous Once Creig Hines, MD        ECOG PERFORMANCE STATUS:  2 - Symptomatic, <50% confined to bed  REVIEW OF SYSTEMS: Patient denies any weight loss, fatigue, weakness, fever, chills or night sweats. Patient denies any loss of vision, blurred vision. Patient denies any ringing  of the ears or hearing loss. No irregular heartbeat. Patient denies heart murmur or history of fainting. Patient denies any chest pain or pain radiating to her upper extremities. Patient denies any shortness of breath, difficulty breathing at night, cough or hemoptysis. Patient denies any swelling in the lower legs. Patient denies any nausea vomiting, vomiting of blood, or coffee ground material in the vomitus. Patient denies any stomach pain. Patient states has had normal bowel movements no significant constipation or diarrhea. Patient denies any dysuria, hematuria or significant nocturia. Patient denies any problems walking, swelling in the joints or loss of balance. Patient denies any skin changes, loss of hair or loss of weight. Patient denies any excessive  worrying or anxiety or significant depression. Patient denies any problems with insomnia. Patient denies excessive thirst, polyuria, polydipsia. Patient denies any swollen glands, patient denies easy bruising or easy bleeding. Patient denies any recent infections, allergies or URI. Patient "s visual fields have not changed significantly in recent time.   PHYSICAL EXAM: There were no vitals taken for this visit. Wheelchair-bound female in moderate pain discomfort.  Motor or sensory and DTR levels are equal and symmetric in the lower extremities proprioception is intact.  Well-developed well-nourished patient in NAD. HEENT reveals PERLA, EOMI, discs not visualized.  Oral cavity is clear. No oral mucosal lesions are identified. Neck is clear without evidence of cervical or supraclavicular adenopathy. Lungs are  clear to A&P. Cardiac examination is essentially unremarkable with regular rate and rhythm without murmur rub or thrill. Abdomen is benign with no organomegaly or masses noted. Motor sensory and DTR levels are equal and symmetric in the upper and lower extremities. Cranial nerves II through XII are grossly intact. Proprioception is intact. No peripheral adenopathy or edema is identified. No motor or sensory levels are noted. Crude visual fields are within normal range.  LABORATORY DATA: Pathology reports reviewed    RADIOLOGY RESULTS: CT scans PET CT scans reviewed compatible with above-stated findings   IMPRESSION: Multiple myeloma involvement of the thoracic spine in 57 year old female  PLAN: At this time like to start palliative radiation therapy to her thoracic spine encompassing the area of myelomatous involvement with destruction of the thoracic spine.  Would plan on delivering 30 Gray in 10 fractions.  Risks and benefits of treatment including possible skin reaction fatigue alteration of blood counts slight chance of radiation esophagitis all were reviewed with the patient.  She comprehends  my recommendations well.  I have personally set up and ordered CT simulation for tomorrow and we will start her treatments this week.  I would like to take this opportunity to thank you for allowing me to participate in the care of your patient.Carmina Miller, MD

## 2023-03-04 NOTE — Progress Notes (Signed)
Per v/o Dr. Smith Robert. Patient given 2 mg of IV morphine for pain 10/10- back pain.

## 2023-03-05 ENCOUNTER — Telehealth: Payer: Self-pay

## 2023-03-05 ENCOUNTER — Ambulatory Visit
Admission: RE | Admit: 2023-03-05 | Discharge: 2023-03-05 | Disposition: A | Discharge status: Home / Self Care | Payer: Managed Care, Other (non HMO) | Source: Ambulatory Visit | Attending: Radiation Oncology | Admitting: Radiation Oncology

## 2023-03-05 DIAGNOSIS — C9000 Multiple myeloma not having achieved remission: Secondary | ICD-10-CM | POA: Diagnosis not present

## 2023-03-05 NOTE — Telephone Encounter (Signed)
MD is holding treatment for now. MD will let us know when to initiate filling and dispensing of Lenalidomide. Closing encounter.    Ardeen Fillers, CPhT Oncology Pharmacy Patient Advocate  [facility]  [phone] (phone) [phone] (fax) 03/05/2023 10:19 AM

## 2023-03-05 NOTE — Telephone Encounter (Signed)
Clinical Social Work was referred by medical provider for assessment of psychosocial needs.  CSW attempted to contact patient by phone.  Left voicemail with contact information and request for return call.

## 2023-03-06 ENCOUNTER — Inpatient Hospital Stay (HOSPITAL_BASED_OUTPATIENT_CLINIC_OR_DEPARTMENT_OTHER): Payer: Managed Care, Other (non HMO) | Admitting: Hospice and Palliative Medicine

## 2023-03-06 ENCOUNTER — Encounter: Payer: Self-pay | Admitting: Internal Medicine

## 2023-03-06 ENCOUNTER — Inpatient Hospital Stay: Payer: Managed Care, Other (non HMO)

## 2023-03-06 ENCOUNTER — Ambulatory Visit: Payer: Managed Care, Other (non HMO)

## 2023-03-06 ENCOUNTER — Inpatient Hospital Stay
Admission: EM | Admit: 2023-03-06 | Discharge: 2023-03-17 | DRG: 186 | Disposition: A | Discharge status: Home / Self Care | Payer: Managed Care, Other (non HMO) | Source: Ambulatory Visit | Attending: Internal Medicine | Admitting: Internal Medicine

## 2023-03-06 ENCOUNTER — Encounter: Payer: Managed Care, Other (non HMO) | Admitting: Hospice and Palliative Medicine

## 2023-03-06 ENCOUNTER — Other Ambulatory Visit: Payer: Self-pay

## 2023-03-06 ENCOUNTER — Other Ambulatory Visit: Payer: Managed Care, Other (non HMO)

## 2023-03-06 ENCOUNTER — Emergency Department: Payer: Managed Care, Other (non HMO)

## 2023-03-06 VITALS — BP 149/85 | HR 76

## 2023-03-06 DIAGNOSIS — E8770 Fluid overload, unspecified: Secondary | ICD-10-CM | POA: Diagnosis present

## 2023-03-06 DIAGNOSIS — J15212 Pneumonia due to Methicillin resistant Staphylococcus aureus: Secondary | ICD-10-CM

## 2023-03-06 DIAGNOSIS — Z7952 Long term (current) use of systemic steroids: Secondary | ICD-10-CM

## 2023-03-06 DIAGNOSIS — J9622 Acute and chronic respiratory failure with hypercapnia: Secondary | ICD-10-CM | POA: Diagnosis present

## 2023-03-06 DIAGNOSIS — R0602 Shortness of breath: Secondary | ICD-10-CM

## 2023-03-06 DIAGNOSIS — F419 Anxiety disorder, unspecified: Secondary | ICD-10-CM | POA: Diagnosis present

## 2023-03-06 DIAGNOSIS — Z9981 Dependence on supplemental oxygen: Secondary | ICD-10-CM | POA: Diagnosis not present

## 2023-03-06 DIAGNOSIS — D75839 Thrombocytosis, unspecified: Secondary | ICD-10-CM | POA: Diagnosis present

## 2023-03-06 DIAGNOSIS — I129 Hypertensive chronic kidney disease with stage 1 through stage 4 chronic kidney disease, or unspecified chronic kidney disease: Secondary | ICD-10-CM | POA: Diagnosis present

## 2023-03-06 DIAGNOSIS — K2270 Barrett's esophagus without dysplasia: Secondary | ICD-10-CM | POA: Diagnosis present

## 2023-03-06 DIAGNOSIS — Z79899 Other long term (current) drug therapy: Secondary | ICD-10-CM

## 2023-03-06 DIAGNOSIS — Z6838 Body mass index (BMI) 38.0-38.9, adult: Secondary | ICD-10-CM

## 2023-03-06 DIAGNOSIS — D849 Immunodeficiency, unspecified: Secondary | ICD-10-CM | POA: Diagnosis present

## 2023-03-06 DIAGNOSIS — J441 Chronic obstructive pulmonary disease with (acute) exacerbation: Principal | ICD-10-CM | POA: Diagnosis present

## 2023-03-06 DIAGNOSIS — D630 Anemia in neoplastic disease: Secondary | ICD-10-CM | POA: Diagnosis present

## 2023-03-06 DIAGNOSIS — E890 Postprocedural hypothyroidism: Secondary | ICD-10-CM | POA: Diagnosis present

## 2023-03-06 DIAGNOSIS — J90 Pleural effusion, not elsewhere classified: Principal | ICD-10-CM | POA: Diagnosis present

## 2023-03-06 DIAGNOSIS — D649 Anemia, unspecified: Secondary | ICD-10-CM | POA: Diagnosis present

## 2023-03-06 DIAGNOSIS — Z813 Family history of other psychoactive substance abuse and dependence: Secondary | ICD-10-CM

## 2023-03-06 DIAGNOSIS — J9621 Acute and chronic respiratory failure with hypoxia: Secondary | ICD-10-CM | POA: Diagnosis present

## 2023-03-06 DIAGNOSIS — B9562 Methicillin resistant Staphylococcus aureus infection as the cause of diseases classified elsewhere: Secondary | ICD-10-CM | POA: Diagnosis not present

## 2023-03-06 DIAGNOSIS — C9000 Multiple myeloma not having achieved remission: Secondary | ICD-10-CM

## 2023-03-06 DIAGNOSIS — N189 Chronic kidney disease, unspecified: Secondary | ICD-10-CM | POA: Diagnosis present

## 2023-03-06 DIAGNOSIS — E039 Hypothyroidism, unspecified: Secondary | ICD-10-CM | POA: Diagnosis present

## 2023-03-06 DIAGNOSIS — Z79890 Hormone replacement therapy (postmenopausal): Secondary | ICD-10-CM

## 2023-03-06 DIAGNOSIS — Z800 Family history of malignant neoplasm of digestive organs: Secondary | ICD-10-CM

## 2023-03-06 DIAGNOSIS — Z833 Family history of diabetes mellitus: Secondary | ICD-10-CM

## 2023-03-06 DIAGNOSIS — Z7951 Long term (current) use of inhaled steroids: Secondary | ICD-10-CM

## 2023-03-06 DIAGNOSIS — Z8261 Family history of arthritis: Secondary | ICD-10-CM

## 2023-03-06 DIAGNOSIS — J9601 Acute respiratory failure with hypoxia: Secondary | ICD-10-CM | POA: Diagnosis not present

## 2023-03-06 DIAGNOSIS — E876 Hypokalemia: Secondary | ICD-10-CM | POA: Diagnosis not present

## 2023-03-06 DIAGNOSIS — Z888 Allergy status to other drugs, medicaments and biological substances status: Secondary | ICD-10-CM

## 2023-03-06 DIAGNOSIS — D6481 Anemia due to antineoplastic chemotherapy: Secondary | ICD-10-CM | POA: Diagnosis present

## 2023-03-06 DIAGNOSIS — Z825 Family history of asthma and other chronic lower respiratory diseases: Secondary | ICD-10-CM

## 2023-03-06 DIAGNOSIS — G893 Neoplasm related pain (acute) (chronic): Secondary | ICD-10-CM | POA: Diagnosis present

## 2023-03-06 DIAGNOSIS — T451X5A Adverse effect of antineoplastic and immunosuppressive drugs, initial encounter: Secondary | ICD-10-CM | POA: Diagnosis present

## 2023-03-06 DIAGNOSIS — N179 Acute kidney failure, unspecified: Secondary | ICD-10-CM | POA: Diagnosis not present

## 2023-03-06 DIAGNOSIS — Z7982 Long term (current) use of aspirin: Secondary | ICD-10-CM

## 2023-03-06 DIAGNOSIS — K219 Gastro-esophageal reflux disease without esophagitis: Secondary | ICD-10-CM | POA: Diagnosis present

## 2023-03-06 DIAGNOSIS — Z8249 Family history of ischemic heart disease and other diseases of the circulatory system: Secondary | ICD-10-CM

## 2023-03-06 DIAGNOSIS — Z811 Family history of alcohol abuse and dependence: Secondary | ICD-10-CM

## 2023-03-06 DIAGNOSIS — W57XXXA Bitten or stung by nonvenomous insect and other nonvenomous arthropods, initial encounter: Secondary | ICD-10-CM | POA: Diagnosis present

## 2023-03-06 DIAGNOSIS — Z8701 Personal history of pneumonia (recurrent): Secondary | ICD-10-CM

## 2023-03-06 DIAGNOSIS — F17200 Nicotine dependence, unspecified, uncomplicated: Secondary | ICD-10-CM | POA: Diagnosis present

## 2023-03-06 DIAGNOSIS — Z87891 Personal history of nicotine dependence: Secondary | ICD-10-CM

## 2023-03-06 DIAGNOSIS — F411 Generalized anxiety disorder: Secondary | ICD-10-CM | POA: Diagnosis present

## 2023-03-06 DIAGNOSIS — Z1152 Encounter for screening for COVID-19: Secondary | ICD-10-CM

## 2023-03-06 DIAGNOSIS — D6489 Other specified anemias: Secondary | ICD-10-CM | POA: Diagnosis present

## 2023-03-06 DIAGNOSIS — G4733 Obstructive sleep apnea (adult) (pediatric): Secondary | ICD-10-CM | POA: Diagnosis present

## 2023-03-06 DIAGNOSIS — E875 Hyperkalemia: Secondary | ICD-10-CM | POA: Diagnosis not present

## 2023-03-06 DIAGNOSIS — I10 Essential (primary) hypertension: Secondary | ICD-10-CM | POA: Diagnosis present

## 2023-03-06 DIAGNOSIS — Z22322 Carrier or suspected carrier of Methicillin resistant Staphylococcus aureus: Secondary | ICD-10-CM

## 2023-03-06 DIAGNOSIS — E669 Obesity, unspecified: Secondary | ICD-10-CM | POA: Diagnosis present

## 2023-03-06 DIAGNOSIS — G894 Chronic pain syndrome: Secondary | ICD-10-CM | POA: Diagnosis present

## 2023-03-06 DIAGNOSIS — Z8269 Family history of other diseases of the musculoskeletal system and connective tissue: Secondary | ICD-10-CM

## 2023-03-06 DIAGNOSIS — Z9884 Bariatric surgery status: Secondary | ICD-10-CM

## 2023-03-06 DIAGNOSIS — T502X5A Adverse effect of carbonic-anhydrase inhibitors, benzothiadiazides and other diuretics, initial encounter: Secondary | ICD-10-CM | POA: Diagnosis not present

## 2023-03-06 LAB — BASIC METABOLIC PANEL
Anion gap: 11 (ref 5–15)
BUN: 14 mg/dL (ref 6–20)
CO2: 30 mmol/L (ref 22–32)
Calcium: 8.5 mg/dL — ABNORMAL LOW (ref 8.9–10.3)
Chloride: 99 mmol/L (ref 98–111)
Creatinine, Ser: 1.54 mg/dL — ABNORMAL HIGH (ref 0.44–1.00)
GFR, Estimated: 39 mL/min — ABNORMAL LOW (ref >60)
Glucose, Bld: 90 mg/dL (ref 70–99)
Potassium: 3.6 mmol/L (ref 3.5–5.1)
Sodium: 140 mmol/L (ref 135–145)

## 2023-03-06 LAB — RESP PANEL BY RT-PCR (RSV, FLU A&B, COVID)  RVPGX2
Influenza A by PCR: NEGATIVE
Influenza B by PCR: NEGATIVE
Resp Syncytial Virus by PCR: NEGATIVE
SARS Coronavirus 2 by RT PCR: NEGATIVE

## 2023-03-06 LAB — CBC
HCT: 24.9 % — ABNORMAL LOW (ref 36.0–46.0)
Hemoglobin: 8.1 g/dL — ABNORMAL LOW (ref 12.0–15.0)
MCH: 30.9 pg (ref 26.0–34.0)
MCHC: 32.5 g/dL (ref 30.0–36.0)
MCV: 95 fL (ref 80.0–100.0)
Platelets: 602 10*3/uL — ABNORMAL HIGH (ref 150–400)
RBC: 2.62 MIL/uL — ABNORMAL LOW (ref 3.87–5.11)
RDW: 14.8 % (ref 11.5–15.5)
WBC: 10 10*3/uL (ref 4.0–10.5)
nRBC: 0 % (ref 0.0–0.2)

## 2023-03-06 LAB — BLOOD GAS, VENOUS
Acid-Base Excess: 7.2 mmol/L — ABNORMAL HIGH (ref 0.0–2.0)
Bicarbonate: 33.1 mmol/L — ABNORMAL HIGH (ref 20.0–28.0)
O2 Saturation: 94.6 %
Patient temperature: 37
pCO2, Ven: 51 mmHg (ref 44–60)
pH, Ven: 7.42 (ref 7.25–7.43)
pO2, Ven: 69 mmHg — ABNORMAL HIGH (ref 32–45)

## 2023-03-06 LAB — GLUCOSE, CAPILLARY: Glucose-Capillary: 122 mg/dL — ABNORMAL HIGH (ref 70–99)

## 2023-03-06 LAB — BRAIN NATRIURETIC PEPTIDE: B Natriuretic Peptide: 239.2 pg/mL — ABNORMAL HIGH (ref 0.0–100.0)

## 2023-03-06 MED ORDER — SENNOSIDES-DOCUSATE SODIUM 8.6-50 MG PO TABS
1.0000 | ORAL_TABLET | Freq: Every evening | ORAL | Status: DC | PRN
  Administered 2023-03-10: 1 via ORAL
  Filled 2023-03-06: qty 1

## 2023-03-06 MED ORDER — LORAZEPAM 2 MG/ML IJ SOLN: 0.5000 mg | Freq: Four times a day (QID) | INTRAMUSCULAR | Status: AC | PRN

## 2023-03-06 MED ORDER — PANTOPRAZOLE SODIUM 40 MG PO TBEC
40.0000 mg | DELAYED_RELEASE_TABLET | Freq: Two times a day (BID) | ORAL | Status: DC
  Administered 2023-03-07 – 2023-03-17 (×21): 40 mg via ORAL
  Filled 2023-03-06 (×21): qty 1

## 2023-03-06 MED ORDER — NICOTINE 21 MG/24HR TD PT24: 21.0000 mg | MEDICATED_PATCH | Freq: Every day | TRANSDERMAL | Status: AC | PRN

## 2023-03-06 MED ORDER — IPRATROPIUM-ALBUTEROL 0.5-2.5 (3) MG/3ML IN SOLN
3.0000 mL | Freq: Four times a day (QID) | RESPIRATORY_TRACT | Status: DC
  Administered 2023-03-06 – 2023-03-07 (×2): 3 mL via RESPIRATORY_TRACT
  Filled 2023-03-06 (×2): qty 3

## 2023-03-06 MED ORDER — LEVOTHYROXINE SODIUM 50 MCG PO TABS
250.0000 ug | ORAL_TABLET | Freq: Every day | ORAL | Status: DC
  Administered 2023-03-07 – 2023-03-17 (×10): 250 ug via ORAL
  Filled 2023-03-06: qty 1
  Filled 2023-03-06 (×3): qty 5
  Filled 2023-03-06: qty 1
  Filled 2023-03-06 (×3): qty 5
  Filled 2023-03-06: qty 1
  Filled 2023-03-06 (×2): qty 5

## 2023-03-06 MED ORDER — HYDROMORPHONE HCL 1 MG/ML IJ SOLN
0.5000 mg | INTRAMUSCULAR | Status: DC | PRN
  Administered 2023-03-06 – 2023-03-07 (×2): 0.5 mg via INTRAVENOUS
  Filled 2023-03-06: qty 0.5
  Filled 2023-03-06: qty 1

## 2023-03-06 MED ORDER — ONDANSETRON HCL 4 MG PO TABS: 4.0000 mg | ORAL_TABLET | Freq: Four times a day (QID) | ORAL | Status: AC | PRN

## 2023-03-06 MED ORDER — AMLODIPINE BESYLATE 5 MG PO TABS: 10.0000 mg | ORAL_TABLET | Freq: Every day | ORAL | Status: DC

## 2023-03-06 MED ORDER — UMECLIDINIUM BROMIDE 62.5 MCG/ACT IN AEPB
1.0000 | INHALATION_SPRAY | Freq: Every day | RESPIRATORY_TRACT | Status: DC
  Administered 2023-03-07 – 2023-03-17 (×10): 1 via RESPIRATORY_TRACT
  Filled 2023-03-06 (×2): qty 7

## 2023-03-06 MED ORDER — DOCUSATE SODIUM 100 MG PO CAPS
100.0000 mg | ORAL_CAPSULE | Freq: Two times a day (BID) | ORAL | Status: DC
  Administered 2023-03-06 – 2023-03-16 (×17): 100 mg via ORAL
  Filled 2023-03-06 (×19): qty 1

## 2023-03-06 MED ORDER — IPRATROPIUM-ALBUTEROL 0.5-2.5 (3) MG/3ML IN SOLN
9.0000 mL | Freq: Once | RESPIRATORY_TRACT | Status: AC
  Administered 2023-03-06: 9 mL via RESPIRATORY_TRACT
  Filled 2023-03-06: qty 3

## 2023-03-06 MED ORDER — MOMETASONE FURO-FORMOTEROL FUM 200-5 MCG/ACT IN AERO
2.0000 | INHALATION_SPRAY | Freq: Two times a day (BID) | RESPIRATORY_TRACT | Status: DC
  Administered 2023-03-07 – 2023-03-17 (×16): 2 via RESPIRATORY_TRACT
  Filled 2023-03-06 (×3): qty 8.8

## 2023-03-06 MED ORDER — ACETAMINOPHEN 325 MG PO TABS: 650.0000 mg | ORAL_TABLET | Freq: Four times a day (QID) | ORAL | Status: AC | PRN

## 2023-03-06 MED ORDER — HYDRALAZINE HCL 20 MG/ML IJ SOLN: 5.0000 mg | Freq: Four times a day (QID) | INTRAMUSCULAR | Status: AC | PRN

## 2023-03-06 MED ORDER — METHYLPREDNISOLONE SODIUM SUCC 125 MG IJ SOLR
125.0000 mg | Freq: Once | INTRAMUSCULAR | Status: AC
  Administered 2023-03-06: 125 mg via INTRAVENOUS
  Filled 2023-03-06: qty 2

## 2023-03-06 MED ORDER — ACETAMINOPHEN 650 MG RE SUPP: 650.0000 mg | Freq: Four times a day (QID) | RECTAL | Status: AC | PRN

## 2023-03-06 MED ORDER — THIAMINE MONONITRATE 100 MG PO TABS
100.0000 mg | ORAL_TABLET | Freq: Every day | ORAL | Status: DC
  Administered 2023-03-07 – 2023-03-17 (×10): 100 mg via ORAL
  Filled 2023-03-06 (×11): qty 1

## 2023-03-06 MED ORDER — ESCITALOPRAM OXALATE 10 MG PO TABS
10.0000 mg | ORAL_TABLET | Freq: Every day | ORAL | Status: DC
  Administered 2023-03-07 – 2023-03-17 (×11): 10 mg via ORAL
  Filled 2023-03-06 (×11): qty 1

## 2023-03-06 MED ORDER — ZOLPIDEM TARTRATE 5 MG PO TABS
5.0000 mg | ORAL_TABLET | Freq: Every evening | ORAL | Status: DC | PRN
  Administered 2023-03-06 – 2023-03-16 (×9): 5 mg via ORAL
  Filled 2023-03-06 (×9): qty 1

## 2023-03-06 MED ORDER — AMLODIPINE BESYLATE 10 MG PO TABS
10.0000 mg | ORAL_TABLET | Freq: Every day | ORAL | Status: DC
  Administered 2023-03-07 – 2023-03-17 (×11): 10 mg via ORAL
  Filled 2023-03-06 (×9): qty 1
  Filled 2023-03-06: qty 2
  Filled 2023-03-06 (×2): qty 1

## 2023-03-06 MED ORDER — GABAPENTIN 100 MG PO CAPS
100.0000 mg | ORAL_CAPSULE | Freq: Three times a day (TID) | ORAL | Status: DC
  Administered 2023-03-06 – 2023-03-17 (×32): 100 mg via ORAL
  Filled 2023-03-06 (×32): qty 1

## 2023-03-06 MED ORDER — HEPARIN SODIUM (PORCINE) 5000 UNIT/ML IJ SOLN: 5000.0000 [IU] | Freq: Three times a day (TID) | INTRAMUSCULAR | Status: DC

## 2023-03-06 MED ORDER — AMOXICILLIN-POT CLAVULANATE 875-125 MG PO TABS
1.0000 | ORAL_TABLET | Freq: Two times a day (BID) | ORAL | Status: AC
  Administered 2023-03-06 – 2023-03-07 (×2): 1 via ORAL
  Filled 2023-03-06 (×2): qty 1

## 2023-03-06 MED ORDER — CLONAZEPAM 1 MG PO TABS
1.0000 mg | ORAL_TABLET | Freq: Two times a day (BID) | ORAL | Status: DC
  Administered 2023-03-06 – 2023-03-17 (×22): 1 mg via ORAL
  Filled 2023-03-06 (×21): qty 1
  Filled 2023-03-06: qty 2

## 2023-03-06 MED ORDER — SODIUM BICARBONATE 650 MG PO TABS
650.0000 mg | ORAL_TABLET | Freq: Three times a day (TID) | ORAL | Status: DC
  Administered 2023-03-06 – 2023-03-17 (×33): 650 mg via ORAL
  Filled 2023-03-06 (×33): qty 1

## 2023-03-06 MED ORDER — BUTALBITAL-APAP-CAFFEINE 50-325-40 MG PO TABS
1.0000 | ORAL_TABLET | ORAL | Status: DC | PRN
  Administered 2023-03-08 – 2023-03-16 (×4): 1 via ORAL
  Filled 2023-03-06 (×5): qty 1

## 2023-03-06 MED ORDER — ONDANSETRON HCL 4 MG/2ML IJ SOLN: 4.0000 mg | Freq: Four times a day (QID) | INTRAMUSCULAR | Status: AC | PRN

## 2023-03-06 NOTE — Hospital Course (Addendum)
HPI on admission 03/06/23:  "Paula Garrett is a 57 year old female with history of current tobacco use, recently diagnosed multiple myeloma status post cycle 1 of cyclophosphamide Velcade dexamethasone chemotherapy, history of COPD, who presents emergency department from outpatient cancer center for chief concerns of shortness of breath."  Vitals in the ED showed temperature of 98.3, respiration rate 24, heart rate of 73, blood pressure 149/133, SpO2 96% on 6 L nasal cannula.  Chest x-ray showed a large left pleural effusion over 50% of left hemithorax.  See H&P for full HPI on admission and detailed ER course.  Patient was in respiratory distress requiring BiPAP on admission.  Admitted to stepdown unit on BiPAP. Pulmonology and IR consulted for thoracentesis vs potential chest tube placement.  Further hospital course and management as outlined below.

## 2023-03-06 NOTE — Assessment & Plan Note (Addendum)
Synthroid

## 2023-03-06 NOTE — Progress Notes (Signed)
Pt transported to ICU 8 on Bipap without incident pt remains on Bipap and is tol well at this time. Report given to ICU RT.

## 2023-03-06 NOTE — Assessment & Plan Note (Deleted)
Secondary to recurrent left sided pleural effusion complicated by patient with severe COPD. --Wean O2 as tolerated, target spO2 > 90% --Otherwise mgmt as outlined

## 2023-03-06 NOTE — ED Notes (Signed)
Informed RN Deloria Lair W). via chat/ pt has bed assigned

## 2023-03-06 NOTE — Assessment & Plan Note (Addendum)
Pulmonology performed bedside U/S-guided thoracentesis today (9/20). 1600 cc fluid removed --Follow pleural fluid studies - cell count, LDH, TP, Albumin, Cholesterol, TG, Gram stain and culture and cytology.  --Echo pending

## 2023-03-06 NOTE — Assessment & Plan Note (Addendum)
In setting of multiple myeloma with bony metastatic disease.   Resumed on home regimen - gabapentin, fentanyl patch (last placed 1700 on 9/18 at home, confirmed by family), PO dilaudid PRN

## 2023-03-06 NOTE — Assessment & Plan Note (Addendum)
Seems more likely due to left pleural effusion.  Pt not wheezing today on exam, has good aeration on lung auscultation. Given Solu-medrol in ED. Defer further steroids for now. Pulmonology following. Continue Duonebs QID Dulera & Incruse

## 2023-03-06 NOTE — Assessment & Plan Note (Addendum)
Nicotine patch ordered.

## 2023-03-06 NOTE — Progress Notes (Signed)
[facility] at [facility] [phone] [phone]  Patient Care Team: Smitty Cords, DO as PCP - General (Family Medicine) Lemar Livings Merrily Pew, MD (General Surgery) Shelia Media, MD (Internal Medicine) Creig Hines, MD as Consulting Physician (Oncology)   NAME OF PATIENT: Paula Garrett  [id number]  28-Apr-1966   DATE OF VISIT: 03/06/23  REASON FOR CONSULT: Paula Garrett is a 57 y.o. female with multiple medical problems including hypertension, depression, anxiety, history of tobacco abuse, and multiple myeloma, who was hospitalized 01/24/2023 to 02/06/2023 with chest pain and shortness of breath and was found to have a large soft tissue mass in the left T6-T7 paraspinal area with destruction of seventh rib and invasion of T6 and T7 vertebral bodies.  Patient underwent surgery on 8/13 with postop period complicated by pain.  Pathology positive for multiple myeloma.  Patient was readmitted 02/10/2023 to 02/21/2023 with AKI.  AKI improved with initiation of myeloma treatment.  Patient was readmitted 02/23/2023 to 02/27/2023 with hypoxic hypercapnic respiratory failure.  Workup consistent with pneumonia.  Patient required thoracentesis for left-sided pleural effusion.  She was discharged on home O2.  INTERVAL HISTORY: Patient presents to [facility] today for follow-up labs and supportive care.  Was called to the lobby as patient acutely short of breath and hypoxic with transferring from her car to the wheelchair.  SpO2 70s on 3 L O2.    PAST MEDICAL HISTORY: Past Medical History:  Diagnosis Date   Anxiety    Arthritis    Asthma    Barrett's esophagus 2015   COVID-19    03/10/20   Depression    GERD (gastroesophageal reflux disease)    Hypertension    Hypothyroidism    Pneumonia    Smoldering myeloma    Thyroid disease     PAST SURGICAL HISTORY:  Past Surgical History:  Procedure Laterality Date   ABLATION   2006   APPLICATION OF INTRAOPERATIVE CT SCAN N/A 01/28/2023   Procedure: APPLICATION OF INTRAOPERATIVE CT SCAN;  Surgeon: Loreen Freud, MD;  Location: [facility] ORS;  Service: Neurosurgery;  Laterality: N/A;   BREAST CYST ASPIRATION Left 1998   BREAST SURGERY     cyst aspiration   BREAST SURGERY  1998   cyst aspiration   COLONOSCOPY  03/2014   COLONOSCOPY WITH PROPOFOL N/A 01/06/2020   Procedure: COLONOSCOPY WITH PROPOFOL;  Surgeon: Toledo, Boykin Nearing, MD;  Location: [facility] ENDOSCOPY;  Service: Gastroenterology;  Laterality: N/A;   ENDOMETRIAL ABLATION     ESOPHAGOGASTRODUODENOSCOPY (EGD) WITH PROPOFOL N/A 08/15/2015   Procedure: ESOPHAGOGASTRODUODENOSCOPY (EGD) WITH PROPOFOL;  Surgeon: Christena Deem, MD;  Location: [facility] ENDOSCOPY;  Service: Endoscopy;  Laterality: N/A;   ESOPHAGOGASTRODUODENOSCOPY (EGD) WITH PROPOFOL N/A 01/22/2016   Procedure: ESOPHAGOGASTRODUODENOSCOPY (EGD) WITH PROPOFOL;  Surgeon: Christena Deem, MD;  Location: [facility] ENDOSCOPY;  Service: Endoscopy;  Laterality: N/A;   ESOPHAGOGASTRODUODENOSCOPY (EGD) WITH PROPOFOL N/A 01/06/2020   Procedure: ESOPHAGOGASTRODUODENOSCOPY (EGD) WITH PROPOFOL;  Surgeon: Toledo, Boykin Nearing, MD;  Location: [facility] ENDOSCOPY;  Service: Gastroenterology;  Laterality: N/A;   GASTRIC BYPASS  2005   HERNIA REPAIR     IR BONE MARROW BIOPSY & ASPIRATION  02/05/2023   NASAL SINUS SURGERY     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   TOTAL THYROIDECTOMY     TUBAL LIGATION      HEMATOLOGY/ONCOLOGY HISTORY:  Oncology History  Multiple myeloma (HCC)  01/25/2023 Initial Diagnosis   Multiple myeloma (HCC)  02/11/2023 -  Chemotherapy   Patient is on Treatment Plan : MYELOMA NEWLY DIAGNOSED TRANSPLANT CANDIDATE Cyclophosphamide IV + Bortezomib SQ + Dexamethasone (CyBorD) q21d x 4 cycles       ALLERGIES:  is allergic to hctz [hydrochlorothiazide].  MEDICATIONS:  Current Outpatient Medications  Medication Sig Dispense Refill   acyclovir (ZOVIRAX)  400 MG tablet Take 1 tablet (400 mg total) by mouth 2 (two) times daily. (Patient not taking: Reported on 03/04/2023) 60 tablet 3   albuterol (VENTOLIN HFA) 108 (90 Base) MCG/ACT inhaler Inhale 2 puffs into the lungs every 6 (six) hours as needed for wheezing or shortness of breath. (Patient not taking: Reported on 03/04/2023) 18 g 11   allopurinol (ZYLOPRIM) 100 MG tablet Take 1 tablet (100 mg total) by mouth daily. (Patient not taking: Reported on 03/04/2023) 30 tablet 0   amLODipine (NORVASC) 10 MG tablet Take 1 tablet (10 mg total) by mouth daily. 90 tablet 3   amoxicillin-clavulanate (AUGMENTIN) 875-125 MG tablet Take 1 tablet by mouth every 12 (twelve) hours for 7 days. 14 tablet 0   aspirin EC 81 MG tablet Take 1 tablet (81 mg total) by mouth daily. Swallow whole. 30 tablet 2   butalbital-acetaminophen-caffeine (FIORICET) 50-325-40 MG tablet Take 1 tablet by mouth every 4 (four) hours as needed for headache. 14 tablet 0   clonazePAM (KLONOPIN) 1 MG tablet TAKE 1 TABLET(1 MG) BY MOUTH TWICE DAILY 60 tablet 2   cyanocobalamin (VITAMIN B12) 1000 MCG/ML injection INJECT 1 ML IN THE MUSCLE EVERY 30 DAYS (Patient not taking: Reported on 03/04/2023) 4 mL 12   cyclobenzaprine (FLEXERIL) 10 MG tablet Take 0.5-1 tablets (5-10 mg total) by mouth 3 (three) times daily as needed for muscle spasms. 60 tablet 1   dexamethasone (DECADRON) 4 MG tablet Take 10 tablets (40 mg) on Day 15 of each cycle. Repeat every 21 days.Take with breakfast. 10 tablet 3   docusate sodium (COLACE) 100 MG capsule Take 1 capsule (100 mg total) by mouth 2 (two) times daily. 10 capsule 0   escitalopram (LEXAPRO) 10 MG tablet Take 1 tablet (10 mg total) by mouth daily. 30 tablet 2   fentaNYL (DURAGESIC) 50 MCG/HR Place 1 patch onto the skin every 3 (three) days. 5 patch 0   gabapentin (NEURONTIN) 100 MG capsule Take 1 capsule (100 mg total) by mouth 3 (three) times daily. 30 capsule 0   HYDROmorphone (DILAUDID) 2 MG tablet Take 1-2  tablets (2-4 mg total) by mouth every 6 (six) hours as needed for severe pain. 60 tablet 0   ipratropium-albuterol (DUONEB) 0.5-2.5 (3) MG/3ML SOLN Take 3 mLs by nebulization 4 (four) times daily. 360 mL 0   levothyroxine (SYNTHROID) 125 MCG tablet Take 2 tablets (250 mcg total) by mouth daily before breakfast. 60 tablet 0   mometasone-formoterol (DULERA) 200-5 MCG/ACT AERO Inhale 2 puffs into the lungs 2 (two) times daily. 1 each 2   ondansetron (ZOFRAN) 8 MG tablet Take 1 tablet (8 mg total) by mouth 2 (two) times daily as needed for nausea or vomiting. Start on day 3 after Cytoxan. (Patient not taking: Reported on 03/04/2023) 30 tablet 1   pantoprazole (PROTONIX) 40 MG tablet 30 min before lunch or dinner 90 tablet 3   polyethylene glycol (MIRALAX / GLYCOLAX) 17 g packet Take 17 g by mouth 2 (two) times daily. (Patient not taking: Reported on 03/04/2023) 14 each 0   prochlorperazine (COMPAZINE) 10 MG tablet Take 1 tablet (10 mg total) by mouth every 6 (  six) hours as needed for nausea or vomiting. (Patient not taking: Reported on 03/04/2023) 30 tablet 1   sodium bicarbonate 650 MG tablet Take 1 tablet (650 mg total) by mouth 3 (three) times daily. 90 tablet 0   Spacer/Aero-Holding Chambers (AEROCHAMBER MV) inhaler Use as instructed 1 each 0   SYRINGE-NEEDLE, DISP, 3 ML 25G X 1-1/2" 3 ML MISC 1 Device by Does not apply route every 30 (thirty) days. With B12 shot 12 each 1   thiamine (VITAMIN B-1) 100 MG tablet Take 1 tablet (100 mg total) by mouth daily. (Patient not taking: Reported on 03/04/2023) 30 tablet 0   umeclidinium bromide (INCRUSE ELLIPTA) 62.5 MCG/ACT AEPB Inhale 1 puff into the lungs daily. 30 each 0   Vitamin D, Ergocalciferol, (DRISDOL) 1.25 MG (50000 UNIT) CAPS capsule Take 50,000 Units by mouth every 7 (seven) days.     zolpidem (AMBIEN) 10 MG tablet TAKE 1/2 TO 1 TABLET(5 TO 10 MG) BY MOUTH AT BEDTIME AS NEEDED FOR SLEEP 30 tablet 2   No current facility-administered medications for  this visit.    VITAL SIGNS: There were no vitals taken for this visit. There were no vitals filed for this visit.  Estimated body mass index is 38.57 kg/m as calculated from the following:   Height as of 03/04/23: 5\' 2"  (1.575 m).   Weight as of 03/04/23: 210 lb 14.4 oz (95.7 kg).  LABS: CBC:    Component Value Date/Time   WBC 8.2 03/04/2023 1334   HGB 7.7 (L) 03/04/2023 1334   HGB 7.4 (L) 02/10/2023 2009   HCT 24.7 (L) 03/04/2023 1334   HCT 38.5 07/02/2011 1529   PLT 661 (H) 03/04/2023 1334   PLT 311 07/02/2011 1529   MCV 98.0 03/04/2023 1334   MCV 90 07/02/2011 1529   NEUTROABS 5.0 03/04/2023 1334   LYMPHSABS 1.5 03/04/2023 1334   MONOABS 1.4 (H) 03/04/2023 1334   EOSABS 0.0 03/04/2023 1334   BASOSABS 0.1 03/04/2023 1334   Comprehensive Metabolic Panel:    Component Value Date/Time   NA 136 03/04/2023 1334   K 3.6 03/04/2023 1334   CL 99 03/04/2023 1334   CO2 30 03/04/2023 1334   BUN 27 (H) 03/04/2023 1334   CREATININE 1.94 (H) 03/04/2023 1334   GLUCOSE 98 03/04/2023 1334   CALCIUM 8.4 (L) 03/04/2023 1334   AST 9 (L) 03/04/2023 1334   ALT 9 03/04/2023 1334   ALKPHOS 82 03/04/2023 1334   BILITOT 0.3 03/04/2023 1334   PROT 7.3 03/04/2023 1334   ALBUMIN 2.1 (L) 03/04/2023 1334    RADIOGRAPHIC STUDIES: DG Chest Port 1 View  Result Date: 02/25/2023 CLINICAL DATA:  Status post left-sided thoracentesis EXAM: PORTABLE CHEST 1 VIEW COMPARISON:  Chest x-ray 02/23/2023 FINDINGS: There is a moderate-sized left pleural effusion. Heart is enlarged. There central pulmonary vascular congestion. Can not exclude underlying airspace disease in the left lung base. Thoracic spinal fusion hardware is present. IMPRESSION: 1. Moderate-sized left pleural effusion. 2. No evidence for a pneumothorax. 3. Cardiomegaly with central pulmonary vascular congestion. Electronically Signed   By: Darliss Cheney M.D.   On: 02/25/2023 01:43   US THORACENTESIS ASP PLEURAL SPACE W/IMG GUIDE  Result  Date: 02/24/2023 INDICATION: 57 year old female. 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 Admitted for altered mental status. Found to have a left-sided pleural effusion. Team is requesting a thoracentesis for further evaluation EXAM: ULTRASOUND GUIDED LEFT-SIDED THERAPEUTIC AND DIAGNOSTIC THORACENTESIS MEDICATIONS: Lidocaine 1% 10 mL COMPLICATIONS: None  immediate. PROCEDURE: An ultrasound guided thoracentesis was thoroughly discussed with the patient and questions answered. The benefits, risks, alternatives and complications were also discussed. The patient understands and wishes to proceed with the procedure. Written consent was obtained. Ultrasound was performed to localize and mark an adequate pocket of fluid in the left chest. The area was then prepped and draped in the normal sterile fashion. 1% Lidocaine was used for local anesthesia. Under ultrasound guidance a 6 Fr Safe-T-Centesis catheter was introduced. Thoracentesis was performed. The catheter was removed and a dressing applied. FINDINGS: A total of approximately 400 mL of serosanguineous fluid was removed. Samples were sent to the laboratory as requested by the clinical team. IMPRESSION: Successful ultrasound guided left-sided therapeutic thoracentesis yielding of pleural fluid. Performed by: Anders Grant, NP Electronically Signed   By: Olive Bass M.D.   On: 02/24/2023 16:21   CT Angio Chest PE W/Cm &/Or Wo Cm  Result Date: 02/23/2023 CLINICAL DATA:  Pulmonary embolism (PE) suspected, high prob l sided opacity on CXR EXAM: CT ANGIOGRAPHY CHEST WITH CONTRAST TECHNIQUE: Multidetector CT imaging of the chest was performed using the standard protocol during bolus administration of intravenous contrast. Multiplanar CT image reconstructions and MIPs were obtained to evaluate the vascular anatomy. RADIATION DOSE REDUCTION: This exam was performed according to the departmental dose-optimization program  which includes automated exposure control, adjustment of the mA and/or kV according to patient size and/or use of iterative reconstruction technique. CONTRAST:  75mL OMNIPAQUE IOHEXOL 350 MG/ML SOLN COMPARISON:  CT chest in 01/24/2023, PET CT 02/12/2023 FINDINGS: Cardiovascular: Poor opacification of the pulmonary arteries due to timing of contrast. No evidence of central pulmonary embolism. The main pulmonary artery is enlarged in caliber measuring up to 3.6 cm. Normal heart size. No significant pericardial effusion. The thoracic aorta is normal in caliber. Mild atherosclerotic plaque of the thoracic aorta. Four-vessel coronary artery calcifications. Mediastinum/Nodes: Interval development of an enlarged right hilar lymph node measuring to 1.8 cm. No enlarged mediastinal, left hilar, or axillary lymph nodes. Thyroid gland, trachea, and esophagus demonstrate no significant findings. Lungs/Pleura: Partial collapse of left lower lobe. Interval worsening of patchy airspace opacities of the left lower lobe and lingula. Interval increase in size and number right upper lobe cluster of spiculated nodularities with nodule measuring up to 1.3 cm. Persistent scattered pulmonary nodules within the right lower lobe. Similar findings within the right middle lobe. No pulmonary mass. Interval increase in small to moderate volume left pleural effusion . No pneumothorax. Upper Abdomen: Stable 2.8 cm right hepatic hypodensity. Gastric surgical changes with small hiatal hernia. Musculoskeletal: No chest wall abnormality. No acute displaced fracture. T4-T9 posterolateral surgical hardware in the setting of a lytic lesion involving the majority of the T6-T7 levels. Associated similar-appearing 9 x 4.5 cm (5:64) soft tissue mass along the left paraspinal resection bed. Redemonstration of resorption of the left posterior seventh rib. Review of the MIP images confirms the above findings. IMPRESSION: 1. Poor opacification of the pulmonary  arteries due to timing of contrast. No central pulmonary embolus. Limited evaluation more distally due to timing of contrast. 2. Enlarged main pulmonary artery-correlate for pulmonary hypertension. 3. Partial collapse of left lower lobe. Interval worsening of patchy airspace opacities of the left lower lobe and lingula. Findings may represent a combination of atelectasis as well as infection/inflammation. 4. Interval increase in small to moderate volume left pleural effusion . 5. Interval increase in size and number of indeterminate scattered pulmonary nodules within the right lung with largest measuring  up to 1.3 cm in the upper lobes. 6. Interval development of an enlarged right hilar lymph node measuring to 1.8 cm. Findings likely reactive in etiology. Recommend attention on follow-up. 7. Redemonstration of lytic lesion involving the majority of the T6-T7 levels with associated similar-appearing 9 x 4.5 cm soft tissue mass along the left paraspinal resection bed. Redemonstration of resorption of the left posterior seventh rib. 8.  Aortic Atherosclerosis (ICD10-I70.0). Electronically Signed   By: Tish Frederickson M.D.   On: 02/23/2023 22:42   CT HEAD WO CONTRAST ( )  Result Date: 02/23/2023 CLINICAL DATA:  Altered mental status EXAM: CT HEAD WITHOUT CONTRAST TECHNIQUE: Contiguous axial images were obtained from the base of the skull through the vertex without intravenous contrast. RADIATION DOSE REDUCTION: This exam was performed according to the departmental dose-optimization program which includes automated exposure control, adjustment of the mA and/or kV according to patient size and/or use of iterative reconstruction technique. COMPARISON:  02/03/2023 FINDINGS: Brain: There is no mass, hemorrhage or extra-axial collection. The size and configuration of the ventricles and extra-axial CSF spaces are normal. The brain parenchyma is normal, without acute or chronic infarction. Vascular: No abnormal  hyperdensity of the major intracranial arteries or dural venous sinuses. No intracranial atherosclerosis. Skull: The visualized skull base, calvarium and extracranial soft tissues are normal. Sinuses/Orbits: No fluid levels or advanced mucosal thickening of the visualized paranasal sinuses. No mastoid or middle ear effusion. The orbits are normal. IMPRESSION: Normal head CT. Electronically Signed   By: Deatra Robinson M.D.   On: 02/23/2023 22:30   DG Chest Port 1 View  Result Date: 02/23/2023 CLINICAL DATA:  Shortness of breath EXAM: PORTABLE CHEST 1 VIEW COMPARISON:  02/18/2023 FINDINGS: Stable cardiomegaly. Moderate left pleural effusion with left basilar opacity. Increasing opacity in the left upper lobe. Interstitial prominence in the right lung. No right-sided pleural effusion. No pneumothorax. IMPRESSION: 1. Moderate left pleural effusion with left basilar opacity, which may represent atelectasis or pneumonia. Increasing opacity in the left upper lobe. 2. Interstitial prominence in the right lung, which may reflect edema. Electronically Signed   By: Duanne Guess D.O.   On: 02/23/2023 20:18   DG Chest Port 1 View  Result Date: 02/18/2023 CLINICAL DATA:  History of multiple myeloma and pulmonary embolus. EXAM: PORTABLE CHEST 1 VIEW COMPARISON:  February 16, 2023. FINDINGS: Stable cardiomegaly. Continued presence of wedge-shaped consolidation within the left midlung concerning for pneumonia or infarction as noted on prior exam. Mild central pulmonary vascular congestion is noted. Left basilar atelectasis or infiltrate and effusion cannot be excluded. Postsurgical changes are seen in thoracic spine. IMPRESSION: Continued presence wedge-shaped consolidation with left midlung concerning for pneumonia or infarction as noted on prior exam. Mild central pulmonary vascular congestion. Left basilar atelectasis or infiltrate and pleural effusion cannot be excluded. Electronically Signed   By: Lupita Raider M.D.    On: 02/18/2023 12:43   NM Pulmonary Perfusion  Result Date: 02/18/2023 CLINICAL DATA:  Concern for pulmonary embolism.  Short of breath. EXAM: NUCLEAR MEDICINE PERFUSION LUNG SCAN TECHNIQUE: Perfusion images were obtained in multiple projections after intravenous injection of radiopharmaceutical. RADIOPHARMACEUTICALS:  4.5 mCi Tc-31m MAA COMPARISON:  Chest radiograph 02/18/2023 FINDINGS: Marked decreased relative perfusion to the entire LEFT lung is favored related to attenuation artifact from large LEFT effusion seen on comparison radiograph. No wedge-shaped peripheral perfusion defects within LEFT or RIGHT lung to suggest acute pulmonary embolism. IMPRESSION: 1. No evidence acute pulmonary embolism. 2. Decreased relative activity in the LEFT  lung is favor related to large LEFT effusion. Electronically Signed   By: Genevive Bi M.D.   On: 02/18/2023 12:40   DG Chest Port 1 View  Result Date: 02/16/2023 CLINICAL DATA:  Fever of unknown origin, history of multiple myeloma EXAM: PORTABLE CHEST 1 VIEW COMPARISON:  02/10/2023, 02/12/2023 FINDINGS: Single frontal view of the chest demonstrates stable enlargement the cardiac silhouette. Chronic vascular congestion. Nodular areas of consolidation are again seen bilaterally, unchanged since recent PET scan. There is a new peripheral area of wedge-shaped consolidation within the left midlung, measuring up to 6.3 cm. Small left pleural effusion. No pneumothorax. Postsurgical changes from thoracic fusion. Destructive lesions of the midthoracic spine are not well visualized on this x-ray. IMPRESSION: 1. New peripheral area of wedge-shaped consolidation within the left mid lung, most consistent with focal pneumonia or pulmonary infarct given rapid development since recent PET CT. 2. Small left pleural effusion. 3. Nodular areas of consolidation bilaterally, which may be inflammatory or infectious. No change since recent PET scan. 4. Chronic vascular congestion.  Electronically Signed   By: Sharlet Salina M.D.   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 EXAM: NUCLEAR MEDICINE PET WHOLE BODY TECHNIQUE: 10.1 mCi F-18 FDG was injected intravenously. Full-ring PET imaging was performed from the head to foot after the radiotracer. CT data was obtained and used for attenuation correction and anatomic localization. Fasting blood glucose: 100 mg/dl COMPARISON:  CT chest 86/57/8469 FINDINGS: Mediastinal blood pool activity: SUV max HEAD/NECK: No hypermetabolic activity in the scalp. No hypermetabolic cervical lymph nodes. Incidental CT findings: none CHEST: At the LEFT paraspinal resection site (T6-T7), there is residual fluid and soft tissue density tissue measuring 6.1 x 3.7 cm. This tissue has no significant metabolic activity. Activity is equal to background blood pool activity. Posterior fusion at this level. No hypermetabolic tissue within the thorax to suggest malignancy within the bones or soft tissue. There are several small nodules in the LEFT lung apex (image 54/6) without metabolic activity. These are new from comparison CT 01/24/2023 and therefore favored foci of infection or inflammation. Small bilateral pleural effusions are present greater on the LEFT. Incidental CT findings: none ABDOMEN/PELVIS: There is moderate metabolic activity through the GE junction. There is postsurgical anatomy of the proximal stomach. No abnormal metabolic activity liver. Scattered physiologic activity in the bowel. No hypermetabolic lymph nodes in the abdomen pelvis. Incidental CT findings: Foley catheter in bladder. SKELETON: Hypermetabolic lesion in the L4 vertebral body with SUV max equal 5.4. associated lytic lesion measuring 19 mm on image 121/series 6. EXTREMITIES: No abnormal hypermetabolic activity in  the lower extremities. Incidental CT findings: No sclerotic or lytic lesions on CT imaging. IMPRESSION: 1. Solitary hypermetabolic lytic lesion in the L4 vertebral body consistent multiple myeloma. Metabolic activity is moderate. 2. Residual soft tissue density at the LEFT paraspinal resection site (T6-T7) with no significant metabolic activity. Favor benign postsurgical tissue. 3. Small bilateral pleural effusions greater on the LEFT. 4. Small nodules in the LEFT lung apex without metabolic activity are favored foci of infection or inflammation. 5. Moderate metabolic activity through the GE junction is favored physiologic or inflammatory. Electronically Signed   By: Genevive Bi M.D.   On: 02/12/2023 13:47   US RENAL  Result Date: 02/10/2023 CLINICAL DATA:  AKI EXAM: RENAL / URINARY TRACT ULTRASOUND COMPLETE COMPARISON:  None Available. FINDINGS: Right Kidney: Renal measurements: 12.5 cm x 5.6 cm x 4.6 cm = volume: 165 mL. Parenchymal echogenicity is normal. There is no hydronephrosis. Left Kidney: Renal measurements: 12.6 cm x 6.4 cm x 5.0 cm = volume: 213 mL. Parenchymal echogenicity is normal. There is no hydronephrosis. Bladder: Decompressed by a Foley catheter. Other: None. IMPRESSION: Unremarkable renal ultrasound. Electronically Signed   By: Lesia Hausen M.D.   On: 02/10/2023 17:03   DG Chest 1 View  Result Date: 02/10/2023 CLINICAL DATA:  Acute kidney injury. Sodium 120. Elevated creatinine. EXAM: CHEST  1 VIEW COMPARISON:  02/01/2023 FINDINGS: Shallow inspiration. Cardiac enlargement. Hazy interstitial infiltrates throughout both lungs most likely representing edema although possibly interstitial pneumonia or chronic fibrosis. Small left pleural effusion with basilar atelectasis or consolidation. This could indicate pneumonia in the appropriate clinical setting. No pneumothorax. Postoperative changes in the thoracic spine. No significant change since prior study. IMPRESSION: Cardiac enlargement  with probable interstitial pulmonary edema. Small left pleural effusion with basilar atelectasis or consolidation. Similar appearance to previous study. Electronically Signed   By: Burman Nieves M.D.   On: 02/10/2023 15:50   IR BONE MARROW BIOPSY & ASPIRATION  Result Date: 02/05/2023 INDICATION: Hematologic abnormality EXAM: Bone marrow aspiration and core biopsy using fluoroscopic guidance MEDICATIONS: None. ANESTHESIA/SEDATION: Moderate (conscious) sedation was employed during this procedure. A total of Versed 1.5 mg and Fentanyl 75 mcg was administered intravenously. Moderate Sedation Time: 10 minutes. The patient's level of consciousness and vital signs were monitored continuously by radiology nursing throughout the procedure under my direct supervision. FLUOROSCOPY TIME:  Fluoroscopy Time: 0.7 minutes (13 mGy) COMPLICATIONS: None immediate. PROCEDURE: Informed written consent was obtained from the patient after a thorough discussion of the procedural risks, benefits and alternatives. All questions were addressed. Maximal Sterile Barrier Technique was utilized including caps, mask, sterile gowns, sterile gloves, sterile drape, hand hygiene and skin antiseptic. A timeout was performed prior to the initiation of the procedure. The patient was placed prone on the exam table. Limited fluoroscopy of the pelvis was performed for planning purposes. Skin entry site was marked, and the overlying skin was prepped and draped in the standard sterile fashion. Local analgesia was obtained with 1% lidocaine. Using fluoroscopic guidance, an 11 gauge needle was advanced just deep to the cortex of the right posterior ilium. Subsequently, bone marrow aspiration and core biopsy were performed. Specimens were submitted to lab/pathology for handling. Hemostasis was achieved with manual pressure, and a clean dressing was placed. The patient tolerated the procedure well without immediate complication. IMPRESSION: Successful bone  marrow aspiration and core biopsy of the right posterior ilium. Electronically Signed   By: Olive Bass M.D.   On: 02/05/2023 15:35    PERFORMANCE STATUS (ECOG) : 3 - Symptomatic, >50% confined to bed  Review of Systems Unless otherwise noted, a complete review of systems is negative.  Physical Exam General: Ill-appearing Cardiovascular: regular rate and rhythm Pulmonary: Minimal air movement left side, breathing labored Abdomen: soft, nontender, + bowel sounds GU: no suprapubic tenderness Extremities: edema, no joint deformities Skin: no rashes Neurological: Weakness but otherwise nonfocal  IMPRESSION/PLAN: Hypoxia -SpO2 initially 70s when patient arrived to clinic.  Breathing labored.  Pulse ox improved to low 90s after oxygen was increased from 3 L to 6 L.  Patient initially confused upon arrival but seemed to clear after oxygen was increased.  Patient with minimal air movement left lung.  Patient's weight has increased 6+lbs since discharging from the hospital, concerning  for fluid retention.  Case discussed with Dr. Smith Robert who advised transferring patient to the emergency department for further evaluation and management.  Discussed with patient who is in agreement with ED.  Case and plan discussed with Dr. Smith Robert  Patient expressed understanding and was in agreement with this plan. She also understands that She can call clinic at any time with any questions, concerns, or complaints.   Thank you for allowing me to participate in the care of this very pleasant patient.   Time Total: 20 minutes  Visit consisted of counseling and education dealing with the complex and emotionally intense issues of symptom management in the setting of serious illness.Greater than 50%  of this time was spent counseling and coordinating care related to the above assessment and plan.  Signed by: Laurette Schimke, PhD, NP-C

## 2023-03-06 NOTE — Assessment & Plan Note (Addendum)
Mostly due to large left pleural effusion. Initially required BiPAP. Improved after thoracentesis.

## 2023-03-06 NOTE — H&P (Signed)
History and Physical   [name] KVQ:[id number] DOB: 1965-09-24 DOA: 03/06/2023  PCP: Paula Cords, DO Outpatient Specialists: Paula Garrett, medical oncology Patient coming from: Outpatient oncology clinic  I have personally briefly reviewed patient's old medical records in [facility] EMR.  Chief Concern: Shortness of breath  HPI: Paula Garrett is a 57 year old female with history of current tobacco use, multiple myeloma, history of COPD, who presents emergency department from outpatient cancer center for chief concerns of shortness of breath.  Vitals in the ED showed temperature of 98.3, respiration rate 24, heart rate of 73, blood pressure 149/133, SpO2 96% on 6 L nasal cannula.  Serum sodium is 140, potassium 3.6, chloride 99, bicarb 30, BUN of 14, serum creatinine 1.54, EGFR 39, nonfasting glucose 90, WBC 10, hemoglobin 8.1, platelets of 602.  Chest x-ray two-view has been ordered and completed pending read at this dictation.  VBG, BNP, respiratory panel have been ordered and pending collection.  Urine pregnancy test has been ordered and pending collection.  Patient had a negative urine pregnancy test on 9/17.  ED treatment: DuoNebs one-time dose, Solu-Medrol 125 mg IV one-time dose.  EDP consulted RT and patient was placed on BiPAP therapy. ---------------------------------- At bedside, BiPAP therapy was on, patient was able to tell me her name, age.  She reports chronic and now worsening shortness of breath over the last few days.  She reports that she has been short of breath since her diagnosis of multiple myeloma.  She reports that she no longer uses tobacco products.  She denies chest pain.  She reports that shortness of breath is worse with exertion.  BiPAP mask was in place.  Social history: Patient lives at home.  She is a former tobacco user quitting smoking now.  ROS: Unable to fully complete as patient has BiPAP mask in place due to increased work of  breathing on nasal cannula . ED Course: Discussed with emergency medicine provider, patient requiring hospitalization for chief concerns of shortness of breath, suspect secondary to COPD exacerbation.  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 after midnight Continuous BiPAP therapy Stepdown, inpatient  COPD with acute exacerbation (HCC) Versus shortness of breath secondary to left pleural effusion  Chronic pain syndrome In setting of multiple myeloma, PDMP reviewed Home gabapentin 100 mg p.o. 3 times daily, clonazepam 1 mg p.o. twice daily resumed Home Dilaudid 1-2 tablets every 6 hours as needed for severe pain not resumed on admission as patient is on BiPAP Dilaudid 0.5 mg IV every 4 hours as needed for severe pain, 3 days ordered Home fentanyl patch not resumed on admission.  Patient initially states that she just put on a patch today and then she states that she is unsure. Discussed with nursing via secure chat to do a skin evaluation and remove any patches present on patient's  body.  Deferred resuming home fentanyl patch due to risk of overdose A.m. team to resume when the benefits outweigh the risk  Acute hypoxic respiratory failure (HCC) Secondary to recurrent left sided pleural effusion complicated by patient with severe  COPD Treat per above  Multiple myeloma (HCC) Continue outpatient follow-up with oncology  Anxiety Home escitalopram 10 mg daily resumed, clonazepam 1 mg p.o. twice daily Lorazepam 0.5 mg IV every 6 hours as needed for anxiety, 15 hours of coverage ordered  OSA (obstructive sleep apnea) Patient reports that she does not wear CPAP at night Given patient's OSA with increased neck circumference, patient would benefit from CPAP nightly Patient currently on continuous BiPAP therapy  Tobacco use disorder As needed nicotine patch ordered  Hypothyroidism Levothyroxine 250 mcg daily before breakfast resumed  Benign essential hypertension Home amlodipine 10 mg daily resumed Hydralazine 5 mg IV every 6 hours as needed for SBP greater than 170, 4 days ordered  SOB (shortness of breath) Secondary to recurrent left-sided pleural effusion Checks x-ray two-view has been ordered and pending read Patient will need left-sided thoracentesis Continue BiPAP therapy Admit to telemetry medical, inpatient  Morbid obesity (HCC) This meets criteria for morbid obesity based on the presence of 1 or more chronic comorbidities. Patient has COPD, MM and BMI of 38.57. This complicates overall care and prognosis.   Chart reviewed.   DVT prophylaxis: TED hose; AM team to initiate pharmacologic DVT prophylaxis when the benefits outweigh the risk Code Status: Full code Diet: Heart healthy; n.p.o. after midnight Family Communication:  Disposition Plan: Pending clinical course Consults called: RT, IR Admission status: Stepdown, inpatient  Past Medical History:  Diagnosis Date   Anxiety    Arthritis    Asthma    Barrett's esophagus 2015   COVID-19    03/10/20   Depression    GERD (gastroesophageal reflux disease)    Hypertension    Hypothyroidism    Pneumonia    Smoldering myeloma    Thyroid disease    Past Surgical History:  Procedure Laterality Date   ABLATION  2006   APPLICATION OF  INTRAOPERATIVE CT SCAN N/A 01/28/2023   Procedure: APPLICATION OF INTRAOPERATIVE CT SCAN;  Surgeon: Loreen Freud, MD;  Location: [facility] ORS;  Service: Neurosurgery;  Laterality: N/A;   BREAST CYST ASPIRATION Left 1998   BREAST SURGERY     cyst aspiration   BREAST SURGERY  1998   cyst aspiration   COLONOSCOPY  03/2014   COLONOSCOPY WITH PROPOFOL N/A 01/06/2020   Procedure: COLONOSCOPY WITH PROPOFOL;  Surgeon: Toledo, Boykin Nearing, MD;  Location: [facility] ENDOSCOPY;  Service: Gastroenterology;  Laterality: N/A;   ENDOMETRIAL ABLATION     ESOPHAGOGASTRODUODENOSCOPY (EGD) WITH PROPOFOL N/A 08/15/2015   Procedure: ESOPHAGOGASTRODUODENOSCOPY (EGD) WITH PROPOFOL;  Surgeon: Christena Deem, MD;  Location: [facility] ENDOSCOPY;  Service: Endoscopy;  Laterality: N/A;   ESOPHAGOGASTRODUODENOSCOPY (EGD) WITH PROPOFOL N/A 01/22/2016   Procedure: ESOPHAGOGASTRODUODENOSCOPY (EGD) WITH PROPOFOL;  Surgeon: Christena Deem, MD;  Location: [facility] ENDOSCOPY;  Service: Endoscopy;  Laterality: N/A;   ESOPHAGOGASTRODUODENOSCOPY (EGD) WITH PROPOFOL N/A 01/06/2020   Procedure: ESOPHAGOGASTRODUODENOSCOPY (EGD) WITH PROPOFOL;  Surgeon: Toledo, Boykin Nearing, MD;  Location: [facility] ENDOSCOPY;  Service: Gastroenterology;  Laterality: N/A;   GASTRIC BYPASS  2005   HERNIA REPAIR     IR BONE MARROW BIOPSY & ASPIRATION  02/05/2023   NASAL SINUS SURGERY     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   TOTAL THYROIDECTOMY     TUBAL  LIGATION     Social History:  reports that she has been smoking cigarettes. She started smoking about 43 years ago. She has a 43 pack-year smoking history. She has never used smokeless tobacco. She reports current alcohol use of about 10.0 - 12.0 standard drinks of alcohol per week. She reports that she does not use drugs.  Allergies  Allergen Reactions   Hctz [Hydrochlorothiazide]     Hyponatremia    Family History  Problem Relation Age of Onset   Heart disease Mother    COPD Mother    Arthritis Mother     Cancer Mother        uterine   Lupus Mother    Anxiety disorder Mother    Depression Mother    Drug abuse Mother    COPD Father    Arthritis Father    Heart disease Father    Heart attack Father    Alcohol abuse Father    Heart disease Brother    Heart attack Brother    Drug abuse Brother    Anxiety disorder Brother    Depression Brother    Heart disease Brother    Cancer Brother        liver cancer age 41 y.o   Diabetes Maternal Grandmother    Diabetes Maternal Grandfather    Diabetes Paternal Grandmother    Diabetes Paternal Grandfather    Breast cancer Neg Hx    Family history: Family history reviewed and not pertinent.  Prior to Admission medications   Medication Sig Start Date End Date Taking? Authorizing Provider  acyclovir (ZOVIRAX) 400 MG tablet Take 1 tablet (400 mg total) by mouth 2 (two) times daily. Patient not taking: Reported on 03/04/2023 02/11/23   Creig Hines, MD  albuterol (VENTOLIN HFA) 108 (90 Base) MCG/ACT inhaler Inhale 2 puffs into the lungs every 6 (six) hours as needed for wheezing or shortness of breath. Patient not taking: Reported on 03/04/2023 02/06/23   Enedina Finner, MD  allopurinol (ZYLOPRIM) 100 MG tablet Take 1 tablet (100 mg total) by mouth daily. Patient not taking: Reported on 03/04/2023 02/19/23   Darlin Priestly, MD  amLODipine (NORVASC) 10 MG tablet Take 1 tablet (10 mg total) by mouth daily. 11/01/22   Kara Dies, NP  amoxicillin-clavulanate (AUGMENTIN) 875-125 MG tablet Take 1 tablet by mouth every 12 (twelve) hours for 7 days. 02/27/23 03/06/23  Loyce Dys, MD  aspirin EC 81 MG tablet Take 1 tablet (81 mg total) by mouth daily. Swallow whole. 02/18/23   Darlin Priestly, MD  butalbital-acetaminophen-caffeine (FIORICET) [phone] MG tablet Take 1 tablet by mouth every 4 (four) hours as needed for headache. 02/18/23   Darlin Priestly, MD  clonazePAM (KLONOPIN) 1 MG tablet TAKE 1 TABLET(1 MG) BY MOUTH TWICE DAILY 03/04/23   Karamalegos, Netta Neat, DO   cyanocobalamin (VITAMIN B12) 1000 MCG/ML injection INJECT 1 ML IN THE MUSCLE EVERY 30 DAYS Patient not taking: Reported on 03/04/2023 03/15/22   McLean-Scocuzza, Pasty Spillers, MD  cyclobenzaprine (FLEXERIL) 10 MG tablet Take 0.5-1 tablets (5-10 mg total) by mouth 3 (three) times daily as needed for muscle spasms. 01/09/23   Karamalegos, Netta Neat, DO  dexamethasone (DECADRON) 4 MG tablet Take 10 tablets (40 mg) on Day 15 of each cycle. Repeat every 21 days.Take with breakfast. 02/11/23   Creig Hines, MD  docusate sodium (COLACE) 100 MG capsule Take 1 capsule (100 mg total) by mouth 2 (two) times daily. 02/06/23   Enedina Finner, MD  escitalopram (  LEXAPRO) 10 MG tablet Take 1 tablet (10 mg total) by mouth daily. 11/20/22   Karamalegos, Netta Neat, DO  fentaNYL (DURAGESIC) 50 MCG/HR Place 1 patch onto the skin every 3 (three) days. 03/04/23   Borders, Daryl Eastern, NP  gabapentin (NEURONTIN) 100 MG capsule Take 1 capsule (100 mg total) by mouth 3 (three) times daily. 02/06/23   Enedina Finner, MD  HYDROmorphone (DILAUDID) 2 MG tablet Take 1-2 tablets (2-4 mg total) by mouth every 6 (six) hours as needed for severe pain. 03/04/23   Borders, Daryl Eastern, NP  ipratropium-albuterol (DUONEB) 0.5-2.5 (3) MG/3ML SOLN Take 3 mLs by nebulization 4 (four) times daily. 02/20/23   Sunnie Nielsen, DO  levothyroxine (SYNTHROID) 125 MCG tablet Take 2 tablets (250 mcg total) by mouth daily before breakfast. 02/27/23   Loyce Dys, MD  mometasone-formoterol (DULERA) 200-5 MCG/ACT AERO Inhale 2 puffs into the lungs 2 (two) times daily. 02/06/23   Enedina Finner, MD  ondansetron (ZOFRAN) 8 MG tablet Take 1 tablet (8 mg total) by mouth 2 (two) times daily as needed for nausea or vomiting. Start on day 3 after Cytoxan. Patient not taking: Reported on 03/04/2023 02/11/23   Creig Hines, MD  pantoprazole (PROTONIX) 40 MG tablet 30 min before lunch or dinner 03/20/22   McLean-Scocuzza, Pasty Spillers, MD  polyethylene glycol (MIRALAX / GLYCOLAX) 17 g  packet Take 17 g by mouth 2 (two) times daily. Patient not taking: Reported on 03/04/2023 02/06/23   Enedina Finner, MD  prochlorperazine (COMPAZINE) 10 MG tablet Take 1 tablet (10 mg total) by mouth every 6 (six) hours as needed for nausea or vomiting. Patient not taking: Reported on 03/04/2023 02/11/23   Creig Hines, MD  sodium bicarbonate 650 MG tablet Take 1 tablet (650 mg total) by mouth 3 (three) times daily. 02/18/23 03/20/23  Darlin Priestly, MD  Spacer/Aero-Holding Chambers (AEROCHAMBER MV) inhaler Use as instructed 12/13/22   Raechel Chute, MD  SYRINGE-NEEDLE, DISP, 3 ML 25G X 1-1/2" 3 ML MISC 1 Device by Does not apply route every 30 (thirty) days. With B12 shot 10/25/21   McLean-Scocuzza, Pasty Spillers, MD  thiamine (VITAMIN B-1) 100 MG tablet Take 1 tablet (100 mg total) by mouth daily. Patient not taking: Reported on 03/04/2023 02/28/23   Loyce Dys, MD  umeclidinium bromide (INCRUSE ELLIPTA) 62.5 MCG/ACT AEPB Inhale 1 puff into the lungs daily. 02/07/23   Enedina Finner, MD  Vitamin D, Ergocalciferol, (DRISDOL) 1.25 MG (50000 UNIT) CAPS capsule Take 50,000 Units by mouth every 7 (seven) days.    [provider]  zolpidem (AMBIEN) 10 MG tablet TAKE 1/2 TO 1 TABLET(5 TO 10 MG) BY MOUTH AT BEDTIME AS NEEDED FOR SLEEP 03/04/23   Paula Cords, DO   Physical Exam: Vitals:   03/06/23 1421 03/06/23 1612 03/06/23 1800 03/06/23 1950  BP: (!) 149/133  114/63   Pulse: 73  64 63  Resp: (!) 24  20   Temp: 98.3 F (36.8 C)  97.8 F (36.6 C)   TempSrc: Oral  Oral   SpO2: 96% 95% 91% 91%  Weight: 95.6 kg      Constitutional: appears age-appropriate, ill Eyes: PERRL, lids and conjunctivae normal ENMT: Mucous membranes are moist. Posterior pharynx clear of any exudate or lesions. Age-appropriate dentition. Hearing appropriate Neck: normal, supple, no masses, no thyromegaly Respiratory: BiPAP lung sounds bilaterally, decreased lung sounds in the right lower to mid lung, mild wheezing.   Increased respiratory effort.  Increased accessory muscle use.  BiPAP in place Cardiovascular: Regular rate and rhythm, no murmurs / rubs / gallops. No extremity edema. 2+ pedal pulses. No carotid bruits.  Abdomen: no tenderness, no masses palpated, no hepatosplenomegaly. Bowel sounds positive.  Musculoskeletal: no clubbing / cyanosis. No joint deformity upper and lower extremities. Good ROM, no contractures, no atrophy. Normal muscle tone.  Skin: no rashes, lesions, ulcers. No induration Neurologic: Sensation intact. Strength 5/5 in all 4.  Psychiatric: Normal judgment and insight. Alert and oriented x 3. Normal mood.   EKG: independently reviewed, showing sinus rhythm with rate of 75, QTc 444  Chest x-ray on Admission: I personally reviewed and I agree with radiologist reading as below.  DG Chest 2 View  Result Date: 03/06/2023 CLINICAL DATA:  Shortness of breath EXAM: CHEST - 2 VIEW COMPARISON:  X-ray 02/24/2023 FINDINGS: Underinflation. Large left pleural effusion and small right with the adjacent opacities. Vascular congestion and question edema. No pneumothorax. Cardiac silhouette is obscured by the pleural and parenchymal abnormalities. Heart is likely enlarged. Overlapping cardiac leads. Significant fixation hardware along the thoracic spine IMPRESSION: Large left pleural effusion and smaller right with the adjacent opacities. Mediastinum and cardiac silhouette is obscured. There appears to be some vascular congestion and possible mild edema. Recommend follow-up Electronically Signed   By: Karen Kays M.D.   On: 03/06/2023 16:16    Labs on Admission: I have personally reviewed following labs  CBC: Recent Labs  Lab 03/04/23 1334 03/06/23 1425  WBC 8.2 10.0  NEUTROABS 5.0  --   HGB 7.7* 8.1*  HCT 24.7* 24.9*  MCV 98.0 95.0  PLT 661* 602*   Basic Metabolic Panel: Recent Labs  Lab 03/04/23 1334 03/06/23 1425  NA 136 140  K 3.6 3.6  CL 99 99  CO2 30 30  GLUCOSE 98 90  BUN  27* 14  CREATININE 1.94* 1.54*  CALCIUM 8.4* 8.5*   GFR: Estimated Creatinine Clearance: 42.4 mL/min (A) (by C-G formula based on SCr of 1.54 mg/dL (H)).  Liver Function Tests: Recent Labs  Lab 03/04/23 1334  AST 9*  ALT 9  ALKPHOS 82  BILITOT 0.3  PROT 7.3  ALBUMIN 2.1*   Urine analysis:    Component Value Date/Time   COLORURINE YELLOW (A) 02/23/2023 2325   APPEARANCEUR CLEAR (A) 02/23/2023 2325   APPEARANCEUR Cloudy (A) 10/27/2020 0831   LABSPEC 1.012 02/23/2023 2325   PHURINE 5.0 02/23/2023 2325   GLUCOSEU NEGATIVE 02/23/2023 2325   GLUCOSEU NEGATIVE 07/25/2017 0823   HGBUR NEGATIVE 02/23/2023 2325   BILIRUBINUR NEGATIVE 02/23/2023 2325   BILIRUBINUR Negative 10/27/2020 0831   KETONESUR NEGATIVE 02/23/2023 2325   PROTEINUR NEGATIVE 02/23/2023 2325   UROBILINOGEN 0.2 06/01/2018 0927   UROBILINOGEN 0.2 07/25/2017 0823   NITRITE NEGATIVE 02/23/2023 2325   LEUKOCYTESUR NEGATIVE 02/23/2023 2325   This document was prepared using Dragon Voice Recognition software and may include unintentional dictation errors.  Dr. Sedalia Muta [facility]  If 7PM-7AM, please contact overnight-coverage provider If 7AM-7PM, please contact day attending provider [email or web address]  03/06/2023, 8:05 PM

## 2023-03-06 NOTE — Assessment & Plan Note (Addendum)
Continue home amlodipine PRN hydralazine

## 2023-03-06 NOTE — Assessment & Plan Note (Addendum)
Body mass index is 37.38 kg/m. Complicates overall care and prognosis.  Recommend lifestyle modifications including physical activity and diet for weight loss and overall long-term health.

## 2023-03-06 NOTE — Assessment & Plan Note (Signed)
-  Continue outpatient follow-up with oncology

## 2023-03-06 NOTE — ED Triage Notes (Signed)
C/O SOB.  Sent to ED from [facility] due to sats on home oxygen 2l/ [place] being in the 70's.  Presents on 6l/ [place].  Oxygen tank changed over to hospital tank.

## 2023-03-06 NOTE — ED Provider Notes (Signed)
[facility] Provider Note    Event Date/Time   First MD Initiated Contact with Patient 03/06/23 1507     (approximate)   History   Shortness of Breath   HPI  Paula Garrett is a 57 y.o. female   Past medical history of multiple myeloma, chemotherapy, COPD on 3 L home oxygen, hypertension, hypothyroid, recent hospitalization for acute respiratory failure and pneumonia who presents emergency department with shortness of breath worsening over the last several days.  No associated cough, productive sputum, chest pain, or fever.  Went to outpatient clinic visit at cancer center and noted to be severely short of breath, hypoxemic on home oxygen placed up to 6 L with improvement of oxygenation.  Aside from worsening shortness of breath she has no other acute medical complaints, denies chest pain or fever, denies cough GI or GU complaints.  Her last chemotherapy session per her report was over 2 weeks ago. External Medical Documents Reviewed: Discharge summary from earlier this month for pneumonia and respiratory failure      Physical Exam   Triage Vital Signs: ED Triage Vitals [03/06/23 1421]  Encounter Vitals Group     BP (!) 149/133     Systolic BP Percentile      Diastolic BP Percentile      Pulse Rate 73     Resp (!) 24     Temp 98.3 F (36.8 C)     Temp Source Oral     SpO2 96 %     Weight 210 lb 13.7 oz (95.6 kg)     Height      Head Circumference      Peak Flow      Pain Score 0     Pain Loc      Pain Education      Exclude from Growth Chart     Most recent vital signs: Vitals:   03/06/23 1421  BP: (!) 149/133  Pulse: 73  Resp: (!) 24  Temp: 98.3 F (36.8 C)  SpO2: 96%    General: Awake, no distress.  CV:  Good peripheral perfusion.  Resp:  Speaking in very short phrases, respiratory distress, increased work of breathing with distant lung sounds scant wheezing throughout. Abd:  No distention.    ED Results / Procedures /  Treatments   Labs (all labs ordered are listed, but only abnormal results are displayed) Labs Reviewed  BASIC METABOLIC PANEL - Abnormal; Notable for the following components:      Result Value   Creatinine, Ser 1.54 (*)    Calcium 8.5 (*)    GFR, Estimated 39 (*)    All other components within normal limits  CBC - Abnormal; Notable for the following components:   RBC 2.62 (*)    Hemoglobin 8.1 (*)    HCT 24.9 (*)    Platelets 602 (*)    All other components within normal limits  RESP PANEL BY RT-PCR (RSV, FLU A&B, COVID)  RVPGX2  BRAIN NATRIURETIC PEPTIDE  BLOOD GAS, VENOUS  POC URINE PREG, ED     I ordered and reviewed the above labs they are notable for white blood cell count is normal, anemia unchanged from prior.  EKG  ED ECG REPORT I, Pilar Jarvis, the attending physician, personally viewed and interpreted this ECG.   Date: 03/06/2023  EKG Time: 1422  Rate: 75  Rhythm: nsr  Axis: nl  Intervals:none  ST&T Change: no stemi    RADIOLOGY I  independently reviewed and interpreted chest x-ray and see left-sided pleural effusion I also reviewed radiologist's formal read.   PROCEDURES:  Critical Care performed: Yes, see critical care procedure note(s)  .Critical Care  Performed by: Pilar Jarvis, MD Authorized by: Pilar Jarvis, MD   Critical care provider statement:    Critical care time (minutes):  30   Critical care was time spent personally by me on the following activities:  Development of treatment plan with patient or surrogate, discussions with consultants, evaluation of patient's response to treatment, examination of patient, ordering and review of laboratory studies, ordering and review of radiographic studies, ordering and performing treatments and interventions, pulse oximetry, re-evaluation of patient's condition and review of old charts    MEDICATIONS ORDERED IN ED: Medications  ipratropium-albuterol (DUONEB) 0.5-2.5 (3) MG/3ML nebulizer solution 9 mL  (has no administration in time range)  methylPREDNISolone sodium succinate (SOLU-MEDROL) 125 mg/2 mL injection 125 mg (has no administration in time range)    External physician / consultants:  I spoke with hospitalist for admit and regarding care plan for this patient.   IMPRESSION / MDM / ASSESSMENT AND PLAN / ED COURSE  I reviewed the triage vital signs and the nursing notes.                                Patient's presentation is most consistent with acute presentation with potential threat to life or bodily function.  Differential diagnosis includes, but is not limited to, acute hypoxemic respiratory failure, COPD exacerbation, respiratory infection   The patient is on the cardiac monitor to evaluate for evidence of arrhythmia and/or significant heart rate changes.  MDM:    Patient with a history of COPD, increased work of breathing with distant lung sounds diffuse wheezing consistent with COPD exacerbation, will give Solu-Medrol, DuoNebs, BiPAP for respiratory distress.  Considered respiratory infection but no leukocytosis fever or cough or productive sputum we will hold antibiotics for further evaluation.  Considered ACS, PE, however without chest pain less likely.  Admission.       FINAL CLINICAL IMPRESSION(S) / ED DIAGNOSES   Final diagnoses:  Acute exacerbation of chronic obstructive pulmonary disease (COPD) (HCC)  Acute respiratory failure with hypoxemia (HCC)     Rx / DC Orders   ED Discharge Orders     None        Note:  This document was prepared using Dragon voice recognition software and may include unintentional dictation errors.    Pilar Jarvis, MD 03/06/23 [phone]

## 2023-03-06 NOTE — Assessment & Plan Note (Addendum)
Resumed on home Lexapro, Klonopin BID

## 2023-03-07 ENCOUNTER — Other Ambulatory Visit: Payer: Self-pay | Admitting: Family Medicine

## 2023-03-07 ENCOUNTER — Inpatient Hospital Stay: Payer: Managed Care, Other (non HMO)

## 2023-03-07 DIAGNOSIS — J90 Pleural effusion, not elsewhere classified: Secondary | ICD-10-CM

## 2023-03-07 DIAGNOSIS — M4804 Spinal stenosis, thoracic region: Secondary | ICD-10-CM

## 2023-03-07 LAB — HEMATOCRIT: HCT: 24.4 % — ABNORMAL LOW (ref 36.0–46.0)

## 2023-03-07 LAB — BASIC METABOLIC PANEL
Anion gap: 12 (ref 5–15)
BUN: 15 mg/dL (ref 6–20)
CO2: 28 mmol/L (ref 22–32)
Calcium: 8.4 mg/dL — ABNORMAL LOW (ref 8.9–10.3)
Chloride: 100 mmol/L (ref 98–111)
Creatinine, Ser: 1.49 mg/dL — ABNORMAL HIGH (ref 0.44–1.00)
GFR, Estimated: 40 mL/min — ABNORMAL LOW (ref >60)
Glucose, Bld: 128 mg/dL — ABNORMAL HIGH (ref 70–99)
Potassium: 4 mmol/L (ref 3.5–5.1)
Sodium: 140 mmol/L (ref 135–145)

## 2023-03-07 LAB — CBC
HCT: 26.6 % — ABNORMAL LOW (ref 36.0–46.0)
Hemoglobin: 8.5 g/dL — ABNORMAL LOW (ref 12.0–15.0)
MCH: 30.1 pg (ref 26.0–34.0)
MCHC: 32 g/dL (ref 30.0–36.0)
MCV: 94.3 fL (ref 80.0–100.0)
Platelets: 634 10*3/uL — ABNORMAL HIGH (ref 150–400)
RBC: 2.82 MIL/uL — ABNORMAL LOW (ref 3.87–5.11)
RDW: 14.6 % (ref 11.5–15.5)
WBC: 10.5 10*3/uL (ref 4.0–10.5)
nRBC: 0 % (ref 0.0–0.2)

## 2023-03-07 LAB — PROTEIN, TOTAL: Total Protein: 6.7 g/dL (ref 6.5–8.1)

## 2023-03-07 LAB — BODY FLUID CELL COUNT WITH DIFFERENTIAL
Eos, Fluid: 0 %
Lymphs, Fluid: 37 %
Monocyte-Macrophage-Serous Fluid: 43 %
Neutrophil Count, Fluid: 19 %
Other Cells, Fluid: 1 %
Total Nucleated Cell Count, Fluid: 506 cu mm

## 2023-03-07 LAB — LACTATE DEHYDROGENASE, PLEURAL OR PERITONEAL FLUID: LD, Fluid: 74 U/L — ABNORMAL HIGH (ref 3–23)

## 2023-03-07 LAB — LACTATE DEHYDROGENASE: LDH: 90 U/L — ABNORMAL LOW (ref 98–192)

## 2023-03-07 LAB — AMYLASE, PLEURAL OR PERITONEAL FLUID: Amylase, Fluid: 12 U/L

## 2023-03-07 LAB — GLUCOSE, PLEURAL OR PERITONEAL FLUID: Glucose, Fluid: 111 mg/dL

## 2023-03-07 LAB — PROTEIN, PLEURAL OR PERITONEAL FLUID: Total protein, fluid: 3.6 g/dL

## 2023-03-07 MED ORDER — CHLORHEXIDINE GLUCONATE CLOTH 2 % EX PADS
6.0000 | MEDICATED_PAD | Freq: Every day | CUTANEOUS | Status: DC
  Administered 2023-03-07: 6 via TOPICAL

## 2023-03-07 MED ORDER — HYDROMORPHONE HCL 1 MG/ML IJ SOLN
0.5000 mg | INTRAMUSCULAR | Status: AC | PRN
  Administered 2023-03-07 – 2023-03-09 (×3): 0.5 mg via INTRAVENOUS
  Filled 2023-03-07 (×4): qty 0.5
  Filled 2023-03-07: qty 1
  Filled 2023-03-07: qty 0.5

## 2023-03-07 MED ORDER — FENTANYL 50 MCG/HR TD PT72
1.0000 | MEDICATED_PATCH | TRANSDERMAL | Status: DC
  Administered 2023-03-08 – 2023-03-17 (×4): 1 via TRANSDERMAL
  Filled 2023-03-07 (×4): qty 1

## 2023-03-07 MED ORDER — HYDROMORPHONE HCL 2 MG PO TABS
2.0000 mg | ORAL_TABLET | Freq: Four times a day (QID) | ORAL | Status: DC | PRN
  Administered 2023-03-07: 4 mg via ORAL
  Filled 2023-03-07: qty 2

## 2023-03-07 MED ORDER — IPRATROPIUM-ALBUTEROL 0.5-2.5 (3) MG/3ML IN SOLN
3.0000 mL | Freq: Two times a day (BID) | RESPIRATORY_TRACT | Status: DC
  Administered 2023-03-07 – 2023-03-11 (×8): 3 mL via RESPIRATORY_TRACT
  Filled 2023-03-07 (×8): qty 3

## 2023-03-07 MED ORDER — HYDROMORPHONE HCL 2 MG PO TABS
2.0000 mg | ORAL_TABLET | Freq: Four times a day (QID) | ORAL | Status: DC | PRN
  Administered 2023-03-08 (×3): 4 mg via ORAL
  Administered 2023-03-09: 2 mg via ORAL
  Administered 2023-03-09 – 2023-03-10 (×4): 4 mg via ORAL
  Filled 2023-03-07 (×8): qty 2
  Filled 2023-03-07: qty 1

## 2023-03-07 NOTE — Plan of Care (Signed)
Continuing with plan of care.

## 2023-03-07 NOTE — Progress Notes (Signed)
2245 - Pt arrived to SD/ICU via ED stretcher wearing BiPAP. Pt A&O x4. Transferred pt to hospital bed. Admission assessment completed. During skin assessment, healing surgical incision noted to patient's mid/upper back back and Fentanyl patch noted to pt's left arm. Pt tearful during admission assessment, emotional support offered. Pt bed alarm on, call bell within reach, and bed in lowest position.   0000 - Placed call to pt's son per pt's request to verify date/time of Fentanyl patch placement. Pt's son, Viviann Spare, Fentanyl patch was placed on 9/18 @ 1700. Provider notified. Pt bed alarm on, call bell within reach, and bed in lowest position.

## 2023-03-07 NOTE — Assessment & Plan Note (Signed)
No acute issues. Monitor.

## 2023-03-07 NOTE — Assessment & Plan Note (Signed)
Pt recently discharged on home oxygen. Presented in respiratory distress requiring BiPAP. Weaned to 5 L/min today (9/20) Secondary to recurrent left sided pleural effusion complicated by patient with severe COPD. --Wean O2 as tolerated, target spO2 > 90% --Otherwise mgmt as outlined

## 2023-03-07 NOTE — Assessment & Plan Note (Signed)
Protonix.  ?

## 2023-03-07 NOTE — Progress Notes (Signed)
Thoracentesis performed by Dr. Malcolm Metro, at bedside, after obtaining consent and performing time out.  Patient tolerated procedure well.

## 2023-03-07 NOTE — Progress Notes (Signed)
Pt taken off of bipap and placed on 6L [place]. Sats 97%, respiratory rate 24/min.

## 2023-03-07 NOTE — Progress Notes (Signed)
At approximately 1045 patient transported to CT with this nurse and remained on cardiac monitoring and 5L [place].  Patient tolerated trip well.

## 2023-03-07 NOTE — Assessment & Plan Note (Signed)
Due to multiple myeloma, myelosuppression from chemo, likely other factors.   --Monitor CBC

## 2023-03-07 NOTE — Progress Notes (Signed)
   03/07/23 1400  Spiritual Encounters  Type of Visit Initial  Care provided to: Pt and family  Referral source Chaplain assessment  Reason for visit Routine spiritual support  OnCall Visit Yes  Spiritual Framework  Presenting Themes Meaning/purpose/sources of inspiration;Goals in life/care;Rituals and practive;Community and relationships  Patient Stress Factors Major life changes  Family Stress Factors Health changes  Interventions  Spiritual Care Interventions Made Established relationship of care and support;Compassionate presence;Reflective listening;Normalization of emotions;Prayer  Intervention Outcomes  Outcomes Connection to spiritual care  Spiritual Care Plan  Spiritual Care Issues Still Outstanding Chaplain will continue to follow   Checking on patients in the ICU made a decision to visit patient and family member. PT has bone cancer was starting treatment on Thursday 03/05/1013 and could not breathe and was sent to main hospital of [facility]. PT is now in the ICU and is scare so I prayer for her and her daughter in law. Told the patient that we are here for her if she just needs to talk.

## 2023-03-07 NOTE — Plan of Care (Signed)

## 2023-03-07 NOTE — Procedures (Signed)
Thoracentesis  Procedure Note  Paula Garrett  [id number]  Aug 25, 1965  Date:03/07/23  Time:2:22 PM   Provider Performing:Jean-Pierre Zema Lizardo   Procedure: Thoracentesis with imaging guidance (08657)  Indication(s) Left Pleural Effusion  Consent Risks of the procedure as well as the alternatives and risks of each were explained to the patient and/or caregiver.  Consent for the procedure was obtained and is signed in the bedside chart  Anesthesia Topical only with 1% lidocaine    Time Out Verified patient identification, verified procedure, site/side was marked, verified correct patient position, special equipment/implants available, medications/allergies/relevant history reviewed, required imaging and test results available.   Sterile Technique Maximal sterile technique including full sterile barrier drape, hand hygiene, sterile gown, sterile gloves, mask, hair covering, sterile ultrasound probe cover (if used).  Procedure Description Ultrasound was used to identify appropriate pleural anatomy for placement and overlying skin marked.  Area of drainage cleaned and draped in sterile fashion. Lidocaine was used to anesthetize the skin and subcutaneous tissue.  1600 cc's of serosanguinous appearing fluid was drained from the left pleural space. Catheter then removed and bandaid applied to site.   Complications/Tolerance None; patient tolerated the procedure well. Chest X-ray is ordered to confirm no post-procedural complication.   EBL Minimal   Specimen(s) Pleural fluid  Janann Colonel, MD [facility] 03/07/2023 2:23 PM

## 2023-03-07 NOTE — Consult Note (Addendum)
This is a case of 57 year old female patient with a recent diagnosis of multiple myeloma, history of presumed COPD/asthma on Breztri no PFTs on file who presented to [facility] on 09/19 with worsening shortness of breath. Pulmonary team is being consulted for large left pleural effusion.   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 Patient underwent surgery on 8/13 with postop period complicated by pain. Pathology positive for multiple myeloma status post cycle 1 of cyclophosphamide Velcade dexamethasone chemotherapy which was given earlier this month when she was admitted to [facility] with acute renal failure.  She was readmitted on 09/9 with acute on chronic hypoxic and hypercapnic respiratory failure requiring BiPAP support and neutropeinc fever. CTA chest at the time ruled out PE but showed an opacity in the lingula and right upper lobe nodular opacity and bilateral pleural effusion. Underwent thoracentesis with lymphocytic predominant exudative effusion. She was managed for CAP, COPD exacerbation and fluid overload and was discharged on 09/12 to continue Augmentin as outpatient for neutropenia prophylaxis.  She was referred again to [facility] on 09/19 with worsening shortness of breath from the cancer center.  In the ED she was afebrile hemodynamically stable requiring 6 L per nasal cannula for saturation of 96%.  Labs showed a white count of 10, hemoglobin of 8.1 and thrombocytosis of 602.  Her creatinine was 1.54 mg/dL with an EGFR of 39.  Respiratory viral panel was negative  Chest x-ray was obtained and showed a large left pleural effusion with opacification of > 50% of the left hemithorax.   Physical Exam  GEN no acute distress HEENT supple neck, reactive pupils,  EOMI CVS normal S1, normal S2, regular rate and rhythm, no murmurs or extra sounds appreciated Lungs diminished breath sound over the left hemithorax and base of the right lung. Abdomen soft nontender nondistended positive bowel sounds Extremities warm well-perfused no edema  Labs and imaging were reviewed  Assessment and plan This is a case of 57 year old female patient with a recent diagnosis of multiple myeloma, history of presumed COPD/asthma on Breztri no PFTs on file who presented to [facility] on 09/19 with worsening shortness of breath. Pulmonary team is being consulted for large left pleural effusion.   # Large left pleural effusion and small right pleural effusion with unclear differential at the moment.  This includes empyema, complicated parapneumonic effusion less likely multiple myeloma related effusion as this is where it happens and only about 5% of cases.  Fluid overload secondary to grade 1 diastolic dysfunction is also possible. # Multiple myeloma status post cycle 1 cyclophosphamide Velcade and dexamethasone on 09/01  []  CT chest wo contrast  []  Proceed with diagnostic and therapeutic thoracentesis  []  Fluid to be sent fro cell count, LDH, TP, Albumin, Cholesterol, TG, Gram stain and culture and cytology.  []  Repeat Echcardiogram.  I spent 60 minutes caring for this patient today, including preparing to see the patient, obtaining a medical history , reviewing a separately obtained history, performing a medically appropriate examination and/or evaluation, counseling and educating the patient/family/caregiver, ordering medications, tests, or procedures, documenting clinical information in the electronic health record, and independently interpreting results (not separately reported/billed) and communicating results to the patient/family/caregiver  Janann Colonel, MD [facility] 03/07/2023 2:15  PM

## 2023-03-07 NOTE — Progress Notes (Signed)
Progress Note   Patient: Paula Garrett UJW:[id number] DOB: 1965-11-30 DOA: 03/06/2023     1 DOS: the patient was seen and examined on 03/07/2023   Brief hospital course: HPI on admission 03/06/23:  "Paula Garrett is a 57 year old female with history of current tobacco use, recently diagnosed multiple myeloma status post cycle 1 of cyclophosphamide Velcade dexamethasone chemotherapy, history of COPD, who presents emergency department from outpatient cancer center for chief concerns of shortness of breath."  Vitals in the ED showed temperature of 98.3, respiration rate 24, heart rate of 73, blood pressure 149/133, SpO2 96% on 6 L nasal cannula.  Chest x-ray showed a large left pleural effusion over 50% of left hemithorax.  See H&P for full HPI on admission and detailed ER course.  Patient was in respiratory distress requiring BiPAP on admission.  Admitted to stepdown unit on BiPAP. Pulmonology and IR consulted for thoracentesis vs potential chest tube placement.  Further hospital course and management as outlined below.    Assessment and Plan: * Recurrent left pleural effusion Pulmonology performed bedside U/S-guided thoracentesis today (9/20). 1600 cc fluid removed --Follow pleural fluid studies - cell count, LDH, TP, Albumin, Cholesterol, TG, Gram stain and culture and cytology.  --Echo pending  COPD with acute exacerbation (HCC) Seems more likely due to left pleural effusion.  Pt not wheezing today on exam, has good aeration on lung auscultation. Given Solu-medrol in ED. Defer further steroids for now. Pulmonology following. Continue Duonebs QID Dulera & Incruse  Acute on chronic respiratory failure with hypoxemia (HCC) Pt recently discharged on home oxygen. Presented in respiratory distress requiring BiPAP. Weaned to 5 L/min today (9/20) Secondary to recurrent left sided pleural effusion complicated by patient with severe COPD. 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   --Monitor CBC  Anxiety Resumed on home Lexapro, Klonopin BID  OSA (obstructive sleep apnea) Not on CPAP  Tobacco use disorder Nicotine patch ordered  Hypothyroidism Synthroid  Benign essential hypertension Continue home amlodipine PRN hydralazine  Barrett's esophagus without dysplasia Protonix  SOB (shortness of breath) Mostly due to large left pleural effusion. Initially required BiPAP. Improved after thoracentesis.  Status post bariatric surgery No acute issues. Monitor  Postoperative hypothyroidism .  Obesity (BMI 30-39.9) Body mass index is 37.38 kg/m. Complicates overall care and prognosis.  Recommend lifestyle modifications including physical activity and diet for weight loss and overall long-term health.        Subjective: Pt seen in stepdown unit this AM, Pulmonologist at bedside.  Pt reports feeling some better, still short of breath but less severe.  Recently taken off Bipap.  She is asking to drink water & eat.  Sister-in-law at bedside.  Pt gets tearful during encounter, due to her health issues, fear of prognosis, another hospital admission and procedure.     Physical Exam: Vitals:   03/07/23 1400 03/07/23 1500 03/07/23 1544 03/07/23 1600  BP: 136/65 (!) 70/50 124/68 (!) 146/69  Pulse: 68 67 75 73  Resp: 16 (!) 27 16 15   Temp:      TempSrc:      SpO2: 98% 95% 98%  97%  Weight:      Height:       General exam: awake, alert, no acute distress HEENT: atraumatic, clear conjunctiva, anicteric sclera, moist mucus  membranes, hearing grossly normal  Respiratory system: CTAB diminsihed on left, no wheezes, increased respiratory effort with accessory muscle use at rest, on 5 L/min [place] o2. Cardiovascular system: normal S1/S2, RRR,no pedal edema.   Gastrointestinal system: soft, NT, ND, no HSM felt, +bowel sounds. Central nervous system: A&O x 3. no gross focal neurologic deficits, normal speech Extremities: moves all, no edema, normal tone Skin: dry, intact, normal temperature Psychiatry: anxious mood, congruent affect, judgement and insight appear normal   Data Reviewed:  Notable labs --- Cr 1.49 from 1.54, glucose 128, Ca 8.4, Hbg 8.5 from 8.1, platelets 634k  Family Communication: sister-in-law at bedside on rounds  Disposition: Status is: Inpatient Remains inpatient appropriate because: severity of illness, procedure today with ongoing evaluation of pleural fluid studies, echo pending.  Weaning down oxygen prior to discharge.     Planned Discharge Destination: Home and Home with Home Health    Time spent: 46 minutes  Author: Pennie Banter, DO 03/07/2023 4:10 PM  For on call review [email or web address].

## 2023-03-08 ENCOUNTER — Inpatient Hospital Stay (HOSPITAL_COMMUNITY)
Admit: 2023-03-08 | Discharge: 2023-03-08 | Disposition: A | Discharge status: Home / Self Care | Payer: Managed Care, Other (non HMO) | Attending: Internal Medicine | Admitting: Internal Medicine

## 2023-03-08 DIAGNOSIS — J90 Pleural effusion, not elsewhere classified: Secondary | ICD-10-CM | POA: Diagnosis not present

## 2023-03-08 DIAGNOSIS — J9621 Acute and chronic respiratory failure with hypoxia: Secondary | ICD-10-CM | POA: Diagnosis not present

## 2023-03-08 LAB — BASIC METABOLIC PANEL
Anion gap: 11 (ref 5–15)
BUN: 14 mg/dL (ref 6–20)
CO2: 30 mmol/L (ref 22–32)
Calcium: 8 mg/dL — ABNORMAL LOW (ref 8.9–10.3)
Chloride: 99 mmol/L (ref 98–111)
Creatinine, Ser: 1.36 mg/dL — ABNORMAL HIGH (ref 0.44–1.00)
GFR, Estimated: 45 mL/min — ABNORMAL LOW (ref >60)
Glucose, Bld: 85 mg/dL (ref 70–99)
Potassium: 3.6 mmol/L (ref 3.5–5.1)
Sodium: 140 mmol/L (ref 135–145)

## 2023-03-08 LAB — CBC
HCT: 23.1 % — ABNORMAL LOW (ref 36.0–46.0)
Hemoglobin: 7.6 g/dL — ABNORMAL LOW (ref 12.0–15.0)
MCH: 30.4 pg (ref 26.0–34.0)
MCHC: 32.9 g/dL (ref 30.0–36.0)
MCV: 92.4 fL (ref 80.0–100.0)
Platelets: 531 10*3/uL — ABNORMAL HIGH (ref 150–400)
RBC: 2.5 MIL/uL — ABNORMAL LOW (ref 3.87–5.11)
RDW: 14.7 % (ref 11.5–15.5)
WBC: 9.7 10*3/uL (ref 4.0–10.5)
nRBC: 0 % (ref 0.0–0.2)

## 2023-03-08 LAB — ECHOCARDIOGRAM COMPLETE
AR max vel: 2.79 cm2
AV Peak grad: 9.2 mmHg
Ao pk vel: 1.52 m/s
Area-P 1/2: 2.87 cm2
Height: 62 in
S' Lateral: 3.4 cm
Single Plane A4C EF: 62 %
Weight: 3269.86 oz

## 2023-03-08 LAB — MRSA NEXT GEN BY PCR, NASAL: MRSA by PCR Next Gen: DETECTED — AB

## 2023-03-08 LAB — STREP PNEUMONIAE URINARY ANTIGEN: Strep Pneumo Urinary Antigen: NEGATIVE

## 2023-03-08 MED ORDER — FUROSEMIDE 10 MG/ML IJ SOLN
20.0000 mg | Freq: Once | INTRAMUSCULAR | Status: AC
  Administered 2023-03-08: 20 mg via INTRAVENOUS
  Filled 2023-03-08: qty 4

## 2023-03-08 NOTE — Progress Notes (Signed)
Patient transferred to room 138 in hospital bed, in stable condition, and with all belongings.

## 2023-03-08 NOTE — Progress Notes (Signed)
Progress Note   Patient: Paula Garrett YTK:[id number] DOB: 11-Nov-1965 DOA: 03/06/2023     2 DOS: the patient was seen and examined on 03/08/2023   Brief hospital course: HPI on admission 03/06/23:  "Paula Garrett is a 57 year old female with history of current tobacco use, recently diagnosed multiple myeloma status post cycle 1 of cyclophosphamide Velcade dexamethasone chemotherapy, history of COPD, who presents emergency department from outpatient cancer center for chief concerns of shortness of breath."  Vitals in the ED showed temperature of 98.3, respiration rate 24, heart rate of 73, blood pressure 149/133, SpO2 96% on 6 L nasal cannula.  Chest x-ray showed a large left pleural effusion over 50% of left hemithorax.  See H&P for full HPI on admission and detailed ER course.  Patient was in respiratory distress requiring BiPAP on admission.  Admitted to stepdown unit on BiPAP. Pulmonology and IR consulted for thoracentesis vs potential chest tube placement.  Further hospital course and management as outlined below.    Assessment and Plan: * Recurrent left pleural effusion Exudative pleural effusion Pulmonology performed bedside U/S-guided thoracentesis (9/20), 1600 cc fluid removed --Pulmonology following --IV lasix 20 mg x 1 today, further diuresis based on clinical response and renal function --Follow pleural fluid cultures and cytology.  --Echo preserved EF, grade I diastolic dysfunction --CXR in AM --Discussed case with ID, as pt had recent pneumonia, just completed her course of antibiotics.  ID recommended either monitoring off antibiotics and follow cultures, or resume Augmentin.  No need for broad spectrum coverage. --Check MRSA screen & antigens for legionella and strep pneumo --Pulmonology considering bronchoscopy for further evaluation if not improvng  COPD with acute exacerbation (HCC) Seems more likely due to left pleural effusion.  Pt not wheezing today on exam, has  good aeration on lung auscultation. Given Solu-medrol in ED. Defer further steroids for now. Pulmonology following. Continue Duonebs QID Dulera & Incruse  Acute on chronic respiratory failure with hypoxemia (HCC) Pt recently discharged on home oxygen. Presented in respiratory distress requiring BiPAP. Weaned to 5 L/min today (9/20) Secondary to recurrent left sided pleural effusion complicated by patient with severe COPD. 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   --Monitor CBC  Anxiety Resumed on home Lexapro, Klonopin BID  OSA (obstructive sleep apnea) Not on CPAP  Tobacco use disorder Nicotine patch ordered  Hypothyroidism Synthroid  Benign essential hypertension Continue home amlodipine PRN hydralazine  Barrett's esophagus without dysplasia Protonix  SOB (shortness of breath) Mostly due to large left pleural effusion. Initially required BiPAP. Improved after thoracentesis.  Status post bariatric surgery No acute issues. Monitor  Postoperative hypothyroidism .  Obesity (BMI 30-39.9) Body mass index is 37.38 kg/m. Complicates overall care and prognosis.  Recommend lifestyle modifications including physical activity and diet for weight loss and overall long-term health.        Subjective: Pt reports her breathing improved after thoracentesis yesterday.  Feels better today, just very tired.  No fever/chills or significant cough.     Physical Exam: Vitals:   03/08/23 0900 03/08/23 1000 03/08/23 1101 03/08/23 1552  BP: 139/60 120/63  136/74 130/65  Pulse: 75 75 65 73  Resp: 18 19 16 18   Temp:    98.3 F (36.8 C)  TempSrc:      SpO2: 94% 95% 96% 98%  Weight:      Height:       General exam: awake, alert, no acute distress HEENT: moist mucus membranes, hearing grossly normal  Respiratory system: CTAB, no wheezes, normal respiratory effort at rest, on 5 L/min [place] o2. Cardiovascular system: normal S1/S2, RRR,no pedal edema.   Gastrointestinal system: soft, NT, ND Central nervous system: A&O x 3. no gross focal neurologic deficits, normal speech Extremities: moves all, no edema, normal tone Skin: dry, intact, normal temperature Psychiatry: normal mood, congruent affect, judgement and insight appear normal   Data Reviewed:  Notable labs --- Cr 1.36 improving, Ca 8.0, Hbg 8.5 from 8.1, platelets 634k  Family Communication: sister-in-law at bedside on rounds 9/20. None present today. Pt is able to update.  Disposition: Status is: Inpatient Remains inpatient appropriate because: severity of illness, cultures pending, requires close clinical monitoring, ongoing O2 needs above baseline   Planned Discharge Destination: Home and Home with Home Health    Time spent: 45 minutes  Author: Pennie Banter, DO 03/08/2023 4:36 PM  For on call review [email or web address].

## 2023-03-08 NOTE — Progress Notes (Signed)
Attempted to call report to receiving nurse

## 2023-03-08 NOTE — Plan of Care (Signed)
Continuing with plan of care. 

## 2023-03-08 NOTE — Progress Notes (Signed)
Report given to charge nurse, Asher Muir, RN.

## 2023-03-08 NOTE — Consult Note (Signed)
Subjective:  S/p Thoracentesis on 09/20 with 1600cc Orange colored fluid drained.  Feeling better today in terms of breathing.  Remains on 6L [place]   Physical Exam  GEN no acute distress HEENT supple neck, reactive pupils, EOMI CVS normal S1, normal S2, regular rate and rhythm, no murmurs or extra sounds appreciated Lungs Improved aeration. Diffuse rales.  Abdomen soft nontender nondistended positive bowel sounds Extremities warm well-perfused no edema  Labs and imaging were reviewed  Assessment and plan This is a case of 57 year old female patient with a recent diagnosis of multiple myeloma, history of presumed COPD/asthma on Breztri no PFTs on file who presented to [facility] on [facility]/19 with worsening shortness of breath. Pulmonary team is being consulted for large left pleural effusion.   # Large left pleural effusion and small right pleural effusion s/p L thora 09/20. Exudative in nature with lymphocytic predominance. unclear differential at the moment. Complicated parapneumonic effusion remanis possible, less likely multiple myeloma related effusion as it happens and only about 5% of cases. Fluid overload secondary to grade 1 diastolic dysfunction is less likely now given nature of effusion. Cyclophosphamide tox can cause pleural effusions.  #DPLD with now right sided patchy opacities - could represent opportunistic infection in this otherwise immunosuppressed patient. Or pulmonary edema. Another differential would be Cyclophosphamide induced lung toxicity which can cause both pleural effusion and pulmonary edema.    # Multiple myeloma status post cycle 1 cyclophosphamide Velcade and dexamethasone on 09/01  []  Gentle diuresis []  Echocardiogram Pending []  Infectious work-up including MRSA Swab, urine strep and legionella and Beta-D Glucan. ID consult.  []  Pending culture and cytology.  []  CXR in the am.  []  Should she not improve with diuresis and opacities persist  and oor worsen will consider diagnostic bronchoscopy with BAL +/- TBBX.    I spent 35 minutes caring for this patient today, including preparing to see the patient, obtaining a medical history , reviewing a separately obtained history, performing a medically appropriate examination and/or evaluation, counseling and educating the patient/family/caregiver, ordering medications, tests, or procedures, documenting clinical information in the electronic health record, and independently interpreting results (not separately reported/billed) and communicating results to the patient/family/caregiver  Janann Colonel, MD [facility] 03/08/2023 2:30 PM

## 2023-03-08 NOTE — Plan of Care (Signed)
  Problem: Clinical Measurements: Goal: Diagnostic test results will improve Outcome: Progressing   Problem: Pain Management: Goal: Pain level will decrease Outcome: Progressing   Problem: Skin Integrity: Goal: Will show signs of wound healing Outcome: Progressing   Problem: Education: Goal: Knowledge of General Education information will improve Description: Including pain rating scale, medication(s)/side effects and non-pharmacologic comfort measures Outcome: Progressing   Problem: Clinical Measurements: Goal: Respiratory complications will improve Outcome: Progressing Goal: Cardiovascular complication will be avoided Outcome: Progressing

## 2023-03-08 NOTE — Progress Notes (Signed)
  Echocardiogram 2D Echocardiogram has been performed.  Lenor Coffin 03/08/2023, 12:55 PM

## 2023-03-09 ENCOUNTER — Inpatient Hospital Stay: Payer: Managed Care, Other (non HMO)

## 2023-03-09 DIAGNOSIS — J90 Pleural effusion, not elsewhere classified: Secondary | ICD-10-CM | POA: Diagnosis not present

## 2023-03-09 LAB — MULTIPLE MYELOMA PANEL, SERUM
Albumin SerPl Elph-Mcnc: 2.1 g/dL — ABNORMAL LOW (ref 2.9–4.4)
Albumin/Glob SerPl: 0.5 — ABNORMAL LOW (ref 0.7–1.7)
Alpha 1: 0.7 g/dL — ABNORMAL HIGH (ref 0.0–0.4)
Alpha2 Glob SerPl Elph-Mcnc: 0.9 g/dL (ref 0.4–1.0)
B-Globulin SerPl Elph-Mcnc: 2.6 g/dL — ABNORMAL HIGH (ref 0.7–1.3)
Gamma Glob SerPl Elph-Mcnc: 0.5 g/dL (ref 0.4–1.8)
Globulin, Total: 4.7 g/dL — ABNORMAL HIGH (ref 2.2–3.9)
IgA: 96 mg/dL (ref 87–352)
IgG (Immunoglobin G), Serum: 2811 mg/dL — ABNORMAL HIGH (ref 586–1602)
IgM (Immunoglobulin M), Srm: 38 mg/dL (ref 26–217)
M Protein SerPl Elph-Mcnc: 1.9 g/dL — ABNORMAL HIGH
Total Protein ELP: 6.8 g/dL (ref 6.0–8.5)

## 2023-03-09 LAB — CBC
HCT: 25.4 % — ABNORMAL LOW (ref 36.0–46.0)
Hemoglobin: 8.1 g/dL — ABNORMAL LOW (ref 12.0–15.0)
MCH: 30.3 pg (ref 26.0–34.0)
MCHC: 31.9 g/dL (ref 30.0–36.0)
MCV: 95.1 fL (ref 80.0–100.0)
Platelets: 493 10*3/uL — ABNORMAL HIGH (ref 150–400)
RBC: 2.67 MIL/uL — ABNORMAL LOW (ref 3.87–5.11)
RDW: 14.9 % (ref 11.5–15.5)
WBC: 9.5 10*3/uL (ref 4.0–10.5)
nRBC: 0 % (ref 0.0–0.2)

## 2023-03-09 LAB — RENAL FUNCTION PANEL
Albumin: 1.8 g/dL — ABNORMAL LOW (ref 3.5–5.0)
Anion gap: 9 (ref 5–15)
BUN: 12 mg/dL (ref 6–20)
CO2: 32 mmol/L (ref 22–32)
Calcium: 7.8 mg/dL — ABNORMAL LOW (ref 8.9–10.3)
Chloride: 99 mmol/L (ref 98–111)
Creatinine, Ser: 1.46 mg/dL — ABNORMAL HIGH (ref 0.44–1.00)
GFR, Estimated: 41 mL/min — ABNORMAL LOW (ref >60)
Glucose, Bld: 86 mg/dL (ref 70–99)
Phosphorus: 3.9 mg/dL (ref 2.5–4.6)
Potassium: 3.4 mmol/L — ABNORMAL LOW (ref 3.5–5.1)
Sodium: 140 mmol/L (ref 135–145)

## 2023-03-09 LAB — TRIGLYCERIDES, BODY FLUIDS: Triglycerides, Fluid: 24 mg/dL

## 2023-03-09 MED ORDER — PIPERACILLIN-TAZOBACTAM 3.375 G IVPB
3.3750 g | Freq: Three times a day (TID) | INTRAVENOUS | Status: DC
  Administered 2023-03-09 – 2023-03-14 (×14): 3.375 g via INTRAVENOUS
  Filled 2023-03-09 (×14): qty 50

## 2023-03-09 MED ORDER — HYDROMORPHONE HCL 1 MG/ML IJ SOLN
1.0000 mg | INTRAMUSCULAR | Status: DC | PRN
  Administered 2023-03-09 – 2023-03-14 (×14): 1 mg via INTRAVENOUS
  Filled 2023-03-09 (×13): qty 1

## 2023-03-09 MED ORDER — VANCOMYCIN HCL 750 MG/150ML IV SOLN
750.0000 mg | INTRAVENOUS | Status: DC
  Filled 2023-03-09: qty 150

## 2023-03-09 MED ORDER — DOXYCYCLINE HYCLATE 100 MG PO TABS
100.0000 mg | ORAL_TABLET | Freq: Two times a day (BID) | ORAL | Status: DC
  Administered 2023-03-09: 100 mg via ORAL
  Filled 2023-03-09: qty 1

## 2023-03-09 MED ORDER — HYDROMORPHONE HCL 1 MG/ML IJ SOLN: 1.0000 mg | Freq: Once | INTRAMUSCULAR | Status: DC

## 2023-03-09 MED ORDER — FUROSEMIDE 10 MG/ML IJ SOLN
20.0000 mg | Freq: Once | INTRAMUSCULAR | Status: AC
  Administered 2023-03-09: 20 mg via INTRAVENOUS
  Filled 2023-03-09: qty 4

## 2023-03-09 MED ORDER — POTASSIUM CHLORIDE CRYS ER 20 MEQ PO TBCR
40.0000 meq | EXTENDED_RELEASE_TABLET | Freq: Once | ORAL | Status: AC
  Administered 2023-03-09: 40 meq via ORAL
  Filled 2023-03-09: qty 2

## 2023-03-09 MED ORDER — AMOXICILLIN-POT CLAVULANATE 875-125 MG PO TABS
1.0000 | ORAL_TABLET | Freq: Two times a day (BID) | ORAL | Status: DC
  Administered 2023-03-09: 1 via ORAL
  Filled 2023-03-09: qty 1

## 2023-03-09 MED ORDER — VANCOMYCIN HCL 2000 MG/400ML IV SOLN
2000.0000 mg | Freq: Once | INTRAVENOUS | Status: AC
  Administered 2023-03-10: 2000 mg via INTRAVENOUS
  Filled 2023-03-09: qty 400

## 2023-03-09 NOTE — Plan of Care (Signed)

## 2023-03-09 NOTE — Consult Note (Signed)
Pharmacy Antibiotic Note  Paula Garrett is a 57 y.o. female admitted on 03/06/2023 with recurrent left pleural effusion.  Pharmacy has been consulted for vancomycin and Zosyn dosing for pneumonia.  Plan: Give vancomycin 2000 mg IV x 1, then start vancomycin 750 mgh IV every 24 hours Estimated AUC 511, Cmin 14.7 IBW, Scr 1.46, Vd 0.5 (BMI 37) Start Zosyn 3.375g IV q8h (4 hour infusion). Vancomycin levels at steady state or as clinically indicated Follow renal function and cultures for adjustments  Height: 5\' 2"  (157.5 cm) Weight: 92.7 kg (204 lb 5.9 oz) IBW/kg (Calculated) : 50.1  Temp (24hrs), Avg:98 F (36.7 C), Min:97.8 F (36.6 C), Max:98.2 F (36.8 C)  Recent Labs  Lab 03/04/23 1334 03/06/23 1425 03/07/23 0428 03/08/23 0259 03/09/23 0327  WBC 8.2 10.0 10.5 9.7 9.5  CREATININE 1.94* 1.54* 1.49* 1.36* 1.46*    Estimated Creatinine Clearance: 43.9 mL/min (A) (by C-G formula based on SCr of 1.46 mg/dL (H)).    Allergies  Allergen Reactions   Hctz [Hydrochlorothiazide]     Hyponatremia     Antimicrobials this admission: Vancomycin 9/22 >>  Zosyn 9/22 >>   Dose adjustments this admission: N/A  Microbiology results: 9/20 Pleural fluid: no growth (pending final) 9/21 MRSA PCR: detected  Thank you for allowing pharmacy to be a part of this patient's care.  Barrie Folk, PharmD 03/09/2023 6:33 PM

## 2023-03-09 NOTE — Plan of Care (Signed)

## 2023-03-09 NOTE — Consult Note (Signed)
Subjective:  S/p Thoracentesis on 09/20 with 1600cc Orange colored fluid drained.  Feeling slightly worse today. CXR with again worsening left pleural effusion. Bedside US showing left pleural effusion mostly anechoic. Right consolidation w/airbronchograms.    Remains on 6L [place]    Physical Exam  GEN no acute distress HEENT supple neck, reactive pupils, EOMI CVS normal S1, normal S2, regular rate and rhythm, no murmurs or extra sounds appreciated Lungs Diminshed breath sounds at the basis.   Abdomen soft nontender nondistended positive bowel sounds Extremities warm well-perfused no edema   Labs and imaging were reviewed   Assessment and plan This is a case of 57 year old female patient with a recent diagnosis of multiple myeloma, history of presumed COPD/asthma on Breztri no PFTs on file who presented to [facility] on 09/19 with worsening shortness of breath. Pulmonary team is being consulted for large left pleural effusion.    # Large left pleural effusion and small right pleural effusion s/p L thora 09/20.  #Now with recurrence left pleural effusion.   Exudative in nature with lymphocytic predominance. unclear differential at the moment. Complicated parapneumonic effusion remanis possible, less likely multiple myeloma related effusion as it happens and only about 5% of cases. Fluid overload secondary to grade 1 diastolic dysfunction is less likely now given nature of effusion. Cyclophosphamide tox can cause pleural effusions.   []  Proceed with Left CT placement []  Chest tube to -20cmH2O  []  Daily CXR    #DPLD with now right sided patchy opacities - [place] with right consolidation.  could represent opportunistic infection in this otherwise immunosuppressed patient. Or pulmonary edema. Another differential would be Cyclophosphamide induced lung toxicity which can cause both pleural effusion and pulmonary edema.    # Multiple myeloma status post cycle 1 cyclophosphamide  Velcade and dexamethasone on 09/01  []  ID consult.  []  Broaden Abx to cover MRSA and Pseudomonas and atypicals.   []  c/w Gentle diuresis []  Echocardiogram is normal. Suggestive of fluid overload.  []  Infectious work-up including MRSA Swab +, urine strep negative and legionella Pending and Beta-D Glucan. ID consult.  []  Pending culture and cytology.  []  Should she not improve with diuresis and opacities persist and oor worsen will consider diagnostic bronchoscopy with BAL +/- TBBX.      I spent 40 minutes caring for this patient today, including preparing to see the patient, obtaining a medical history , reviewing a separately obtained history, performing a medically appropriate examination and/or evaluation, counseling and educating the patient/family/caregiver, ordering medications, tests, or procedures, documenting clinical information in the electronic health record, and independently interpreting results (not separately reported/billed) and communicating results to the patient/family/caregiver  Janann Colonel, MD [facility] 03/09/2023 6:13 PM

## 2023-03-09 NOTE — Progress Notes (Signed)
Progress Note   Patient: Paula Garrett:[id number] DOB: 1965-12-28 DOA: 03/06/2023     3 DOS: the patient was seen and examined on 03/09/2023   Brief hospital course: HPI on admission 03/06/23:  "Paula Garrett is a 57 year old female with history of current tobacco use, recently diagnosed multiple myeloma status post cycle 1 of cyclophosphamide Velcade dexamethasone chemotherapy, history of COPD, who presents emergency department from outpatient cancer center for chief concerns of shortness of breath."  Vitals in the ED showed temperature of 98.3, respiration rate 24, heart rate of 73, blood pressure 149/133, SpO2 96% on 6 L nasal cannula.  Chest x-ray showed a large left pleural effusion over 50% of left hemithorax.  See H&P for full HPI on admission and detailed ER course.  Patient was in respiratory distress requiring BiPAP on admission.  Admitted to stepdown unit on BiPAP. Pulmonology and IR consulted for thoracentesis vs potential chest tube placement.  Further hospital course and management as outlined below.    Assessment and Plan: * Recurrent left pleural effusion Exudative pleural effusion Pulmonology performed bedside U/S-guided thoracentesis (9/20), 1600 cc fluid removed --Pulmonology following --IV lasix 20 mg x 1 (9/21) - repeat dose today. --Further diuresis based on clinical response and renal function --Follow pleural fluid cultures and cytology.  --Echo preserved EF, grade I diastolic dysfunction --CXR this AM shows b/l pleural effusions, left re-accumulating since thoracentesis --Discussed case with ID on call this weekend, as pt had recent pneumonia, just completed her course of antibiotics.  ID recommended either monitoring off antibiotics and follow cultures, or resume Augmentin.  No need for broad spectrum coverage. --MRSA screen positive --Will start on empiric Doxy and Augmentin pending formal ID consult tomorrow --Follow pending legionella  antigen --Pulmonology considering bronchoscopy for further evaluation if not improvng  COPD with acute exacerbation (HCC) Seems more likely due to left pleural effusion.  Pt not wheezing today on exam, has good aeration on lung auscultation. Given Solu-medrol in ED. Defer further steroids for now. Pulmonology following. Continue Duonebs QID Dulera & Incruse  Acute on chronic respiratory failure with hypoxemia (HCC) Pt recently discharged on home oxygen. Presented in respiratory distress requiring BiPAP. Weaned to 5 L/min (9/20) Secondary to recurrent left sided pleural effusion complicated by patient with severe COPD. 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   --Monitor CBC  Anxiety Resumed on home Lexapro, Klonopin BID  OSA (obstructive sleep apnea) Not on CPAP  Tobacco use disorder Nicotine patch ordered  Hypothyroidism Synthroid  Benign essential hypertension Continue home amlodipine PRN hydralazine  Barrett's esophagus without dysplasia Protonix  SOB (shortness of breath) Mostly due to large left pleural effusion. Initially required BiPAP. Improved after thoracentesis.  Status post bariatric surgery No acute issues. Monitor  Postoperative hypothyroidism .  Obesity (BMI 30-39.9) Body mass index is 37.38 kg/m. Complicates overall care and prognosis.  Recommend lifestyle modifications including physical activity and diet for weight loss and overall long-term health.        Subjective: Pt seen with her bf at bedside this AM.  She  reports feeling tired.  Not as short of breath  as before thoracentesis couple of days ago.  She is hopeful we find the cause of fluid building up in her chest, is anxious to be discharged home again & get sicker again & need to come back to hospital.  She's been going back and forth home to hospital too much recently, since her myeloma diagnosis.  Denies fever chills.  Does have productive sounding cough.  Reports increased urine output after Lasix yesterday. Endorses 40 lb weight gain on steroids for myeloma, and swelling in her face and all extremities.   Physical Exam: Vitals:   03/08/23 1101 03/08/23 1552 03/09/23 0036 03/09/23 0852  BP: 136/74 130/65 132/73 (!) 141/59  Pulse: 65 73 74 83  Resp: 16 18 18 20   Temp:  98.3 F (36.8 C) 98 F (36.7 C) 98.2 F (36.8 C)  TempSrc:    Oral  SpO2: 96% 98% 94% 95%  Weight:      Height:       General exam: awake, alert, no acute distress HEENT: moist mucus membranes, hearing grossly normal  Respiratory system: diminished bases L>R, coarse sounding cough, normal respiratory effort at rest, on 5 L/min [place] o2. Cardiovascular system: normal S1/S2, RRR,no pedal edema.   Gastrointestinal system: soft, NT, ND Central nervous system: A&O x 3. no gross focal neurologic deficits, normal speech Extremities: moves all, no edema, normal tone Skin: dry, intact, normal temperature Psychiatry: normal mood, congruent affect, judgement and insight appear normal   Data Reviewed:  Notable labs --- Cr 1.46 from 1.36, Ca 7.8, Hbg 7.6 >> 8.1, platelets 493k  Strep pneumo antigen negative  Pending - pleural fluid cultures & cytology   Family Communication: female friend at bedside on rounds.    Disposition: Status is: Inpatient Remains inpatient appropriate because: severity of illness, cultures pending, requires close clinical monitoring, ongoing O2 needs above baseline   Planned Discharge Destination: Home and Home with Home Health    Time spent: 45  minutes  Author: Pennie Banter, DO 03/09/2023 2:58 PM  For on call review [email or web address].

## 2023-03-09 NOTE — Procedures (Signed)
Insertion of Chest Tube Procedure Note  DESHAI CRUVER  [id number]  07/24/1965  Date:03/09/23  Time:5:23 PM    Provider Performing: Janann Colonel   Procedure: Pleural Catheter Insertion w/ Imaging Guidance ([id number])  Indication(s) Effusion  Consent Risks of the procedure as well as the alternatives and risks of each were explained to the patient and/or caregiver.  Consent for the procedure was obtained and is signed in the bedside chart  Anesthesia Topical only with 1% lidocaine    Time Out Verified patient identification, verified procedure, site/side was marked, verified correct patient position, special equipment/implants available, medications/allergies/relevant history reviewed, required imaging and test results available.   Sterile Technique Maximal sterile technique including full sterile barrier drape, hand hygiene, sterile gown, sterile gloves, mask, hair covering, sterile ultrasound probe cover (if used).   Procedure Description Ultrasound used to identify appropriate pleural anatomy for placement and overlying skin marked. Area of placement cleaned and draped in sterile fashion.  A 14 French pigtail pleural catheter was placed into the left pleural space using Seldinger technique. Appropriate return of fluid was obtained.  The tube was connected to atrium and placed on -20 cm H2O wall suction.   Complications/Tolerance None; patient tolerated the procedure well. Chest X-ray is ordered to verify placement.   EBL Minimal  Specimen(s) none  Janann Colonel, MD [place] [facility] 03/09/2023 5:24 PM

## 2023-03-10 ENCOUNTER — Inpatient Hospital Stay: Payer: Managed Care, Other (non HMO)

## 2023-03-10 ENCOUNTER — Ambulatory Visit: Payer: Managed Care, Other (non HMO)

## 2023-03-10 DIAGNOSIS — B9562 Methicillin resistant Staphylococcus aureus infection as the cause of diseases classified elsewhere: Secondary | ICD-10-CM | POA: Diagnosis not present

## 2023-03-10 DIAGNOSIS — J9601 Acute respiratory failure with hypoxia: Secondary | ICD-10-CM | POA: Diagnosis not present

## 2023-03-10 DIAGNOSIS — C9000 Multiple myeloma not having achieved remission: Secondary | ICD-10-CM

## 2023-03-10 DIAGNOSIS — J90 Pleural effusion, not elsewhere classified: Secondary | ICD-10-CM | POA: Diagnosis not present

## 2023-03-10 LAB — BASIC METABOLIC PANEL
Anion gap: 12 (ref 5–15)
BUN: 15 mg/dL (ref 6–20)
CO2: 30 mmol/L (ref 22–32)
Calcium: 8 mg/dL — ABNORMAL LOW (ref 8.9–10.3)
Chloride: 99 mmol/L (ref 98–111)
Creatinine, Ser: 1.76 mg/dL — ABNORMAL HIGH (ref 0.44–1.00)
GFR, Estimated: 33 mL/min — ABNORMAL LOW (ref >60)
Glucose, Bld: 103 mg/dL — ABNORMAL HIGH (ref 70–99)
Potassium: 4.2 mmol/L (ref 3.5–5.1)
Sodium: 141 mmol/L (ref 135–145)

## 2023-03-10 LAB — CBC
HCT: 26.1 % — ABNORMAL LOW (ref 36.0–46.0)
Hemoglobin: 8.3 g/dL — ABNORMAL LOW (ref 12.0–15.0)
MCH: 30.4 pg (ref 26.0–34.0)
MCHC: 31.8 g/dL (ref 30.0–36.0)
MCV: 95.6 fL (ref 80.0–100.0)
Platelets: 464 10*3/uL — ABNORMAL HIGH (ref 150–400)
RBC: 2.73 MIL/uL — ABNORMAL LOW (ref 3.87–5.11)
RDW: 15.2 % (ref 11.5–15.5)
WBC: 10.6 10*3/uL — ABNORMAL HIGH (ref 4.0–10.5)
nRBC: 0 % (ref 0.0–0.2)

## 2023-03-10 LAB — MAGNESIUM: Magnesium: 1.6 mg/dL — ABNORMAL LOW (ref 1.7–2.4)

## 2023-03-10 LAB — PROCALCITONIN: Procalcitonin: 0.13 ng/mL

## 2023-03-10 LAB — LEGIONELLA PNEUMOPHILA SEROGP 1 UR AG: L. pneumophila Serogp 1 Ur Ag: NEGATIVE

## 2023-03-10 LAB — LACTATE DEHYDROGENASE: LDH: 111 U/L (ref 98–192)

## 2023-03-10 MED ORDER — MAGNESIUM SULFATE 2 GM/50ML IV SOLN
2.0000 g | Freq: Once | INTRAVENOUS | Status: AC
  Administered 2023-03-10: 2 g via INTRAVENOUS
  Filled 2023-03-10: qty 50

## 2023-03-10 MED ORDER — ALBUTEROL SULFATE (2.5 MG/3ML) 0.083% IN NEBU: 2.5000 mg | INHALATION_SOLUTION | Freq: Four times a day (QID) | RESPIRATORY_TRACT | Status: DC | PRN

## 2023-03-10 MED ORDER — HYDROMORPHONE HCL 2 MG PO TABS
2.0000 mg | ORAL_TABLET | ORAL | Status: DC | PRN
  Administered 2023-03-10 (×2): 4 mg via ORAL
  Administered 2023-03-10: 2 mg via ORAL
  Administered 2023-03-11 – 2023-03-12 (×7): 4 mg via ORAL
  Administered 2023-03-12 – 2023-03-13 (×3): 2 mg via ORAL
  Administered 2023-03-14 – 2023-03-16 (×12): 4 mg via ORAL
  Administered 2023-03-16: 2 mg via ORAL
  Administered 2023-03-16 – 2023-03-17 (×3): 4 mg via ORAL
  Administered 2023-03-17: 2 mg via ORAL
  Filled 2023-03-10 (×19): qty 2
  Filled 2023-03-10: qty 1
  Filled 2023-03-10 (×7): qty 2
  Filled 2023-03-10 (×2): qty 1
  Filled 2023-03-10 (×2): qty 2

## 2023-03-10 MED ORDER — LINEZOLID 600 MG/300ML IV SOLN
600.0000 mg | Freq: Two times a day (BID) | INTRAVENOUS | Status: DC
  Administered 2023-03-10 – 2023-03-14 (×8): 600 mg via INTRAVENOUS
  Filled 2023-03-10 (×9): qty 300

## 2023-03-10 MED ORDER — VANCOMYCIN HCL 1250 MG/250ML IV SOLN: 1250.0000 mg | INTRAVENOUS | Status: DC

## 2023-03-10 NOTE — Assessment & Plan Note (Signed)
Mg 1.6 on 9/24 was replaced Monitor & replace Mg as needed.

## 2023-03-10 NOTE — Consult Note (Signed)
Pharmacy Antibiotic Note  Paula Garrett is a 57 y.o. female admitted on 03/06/2023 with recurrent left pleural effusion.  Pharmacy has been consulted for vancomycin and Zosyn dosing for pneumonia.  Plan:  Adjust Vancomycin IV 750 mg q24h to Vancomycin IV 1250 mg q48h  Estimated AUC 499.5, Cmin 10.6 IBW, Scr 1.76, Vd 0.5 (BMI 37) Continue Zosyn 3.375g IV q8h (4 hour infusion). Follow renal function  Height: 5\' 2"  (157.5 cm) Weight: 92.7 kg (204 lb 5.9 oz) IBW/kg (Calculated) : 50.1  Temp (24hrs), Avg:97.9 F (36.6 C), Min:97.8 F (36.6 C), Max:98 F (36.7 C)  Recent Labs  Lab 03/06/23 1425 03/07/23 0428 03/08/23 0259 03/09/23 0327 03/10/23 0537  WBC 10.0 10.5 9.7 9.5 10.6*  CREATININE 1.54* 1.49* 1.36* 1.46* 1.76*    Estimated Creatinine Clearance: 36.5 mL/min (A) (by C-G formula based on SCr of 1.76 mg/dL (H)).    Allergies  Allergen Reactions   Hctz [Hydrochlorothiazide]     Hyponatremia     Antimicrobials this admission: Vancomycin 9/22 >>  Zosyn 9/22 >>   Dose adjustments this admission: N/A  Microbiology results: 9/20 Pleural fluid: no growth 9/21 MRSA PCR: detected  Thank you for allowing pharmacy to be a part of this patient's care.  Erasmo Leventhal, PharmD Candidate 2025 03/10/2023 1:49 PM

## 2023-03-10 NOTE — Plan of Care (Signed)
Patient has been free of injury or falls. VSS. Patient reports pain at chest tube site is relieved by pain medication. Patient's airway has remained patent and patient's oxygen saturations are WNL. Chest tube output has been minimal for this shift.  Nursing will continue to monitor progress towards goals.

## 2023-03-10 NOTE — Consult Note (Addendum)
NAME: Paula Garrett  DOB: 01-02-1966  MRN: [id number]  Date/Time: 03/10/2023 4:58 PM  REQUESTING PROVIDER; Dr.Griffith Subjective:  REASON FOR CONSULT: left pleural effusion ? Paula Garrett is a 57 y.o. female with a history of gastric bypass, Multiple myeloma, hypothyroidism.had chest pain and sob in Aug 2024 and found to have a large soft tissue mass Left T6-T7 paraspinal area with destruction 7th rib and invasion of T6-T7 vertebral bodiesT underwent T4-T9 fusion and T6-T7 laminotomy ( pathology plasma cell neoplasm). anemia, OSA, hypothyroidism, COPD, last admission to [facility] 02/23/23-02/27/23 for hypoxic, hypercapneic resp failure, pneunmonia, left pleural effusion and underwent thoracentesis and culture neg, sent on augmentin and home oxygen presented to the Onc clinic on 9/19 with SOB abd sats in 70s and was sent ot the ED Vitals in the ED  03/06/23 14:21  BP 149/133 (H)  Temp 98.3 F (36.8 C)  Pulse Rate 73  Resp 24 !  SpO2 96 %  O2 Flow Rate (L/min) 6 L/min    Labs  Latest Reference Range & Units 03/06/23 14:25  WBC 4.0 - 10.5 K/uL 10.0  Hemoglobin 12.0 - 15.0 g/dL 8.1 (L)  HCT 78.2 - 95.6 % 24.9 (L)  Platelets 150 - 400 K/uL 602 (H)  Creatinine 0.44 - 1.00 mg/dL 2.13 (H)   CT chest showed loculated left pleural effusion and patchy central predominant consolidation Pt was started on vancomycin and piptazo and I am asked to see the patient She has received 2 doses of chemo- cytotaxan and velcade on 8/28 and 9/3  Past Medical History:  Diagnosis Date   Anxiety    Arthritis    Asthma    Barrett's esophagus 2015   COVID-19    03/10/20   Depression    GERD (gastroesophageal reflux disease)    Hypertension    Hypothyroidism    Pneumonia    Smoldering myeloma    Thyroid disease     Past Surgical History:  Procedure Laterality Date   ABLATION  2006   APPLICATION OF INTRAOPERATIVE CT SCAN N/A 01/28/2023   Procedure: APPLICATION OF INTRAOPERATIVE CT SCAN;  Surgeon: Loreen Freud, MD;  Location: [facility] ORS;  Service: Neurosurgery;  Laterality: N/A;   BREAST CYST ASPIRATION Left 1998   BREAST SURGERY     cyst aspiration   BREAST SURGERY  1998   cyst aspiration   COLONOSCOPY  03/2014   COLONOSCOPY WITH PROPOFOL N/A 01/06/2020   Procedure: COLONOSCOPY WITH PROPOFOL;  Surgeon: Toledo, Boykin Nearing, MD;  Location: [facility] ENDOSCOPY;  Service: Gastroenterology;  Laterality: N/A;   ENDOMETRIAL ABLATION     ESOPHAGOGASTRODUODENOSCOPY (EGD) WITH PROPOFOL N/A 08/15/2015   Procedure: ESOPHAGOGASTRODUODENOSCOPY (EGD) WITH PROPOFOL;  Surgeon: Christena Deem, MD;  Location: [facility] ENDOSCOPY;  Service: Endoscopy;  Laterality: N/A;   ESOPHAGOGASTRODUODENOSCOPY (EGD) WITH PROPOFOL N/A 01/22/2016   Procedure: ESOPHAGOGASTRODUODENOSCOPY (EGD) WITH PROPOFOL;  Surgeon: Christena Deem, MD;  Location: [facility] ENDOSCOPY;  Service: Endoscopy;  Laterality: N/A;   ESOPHAGOGASTRODUODENOSCOPY (EGD) WITH PROPOFOL N/A 01/06/2020   Procedure: ESOPHAGOGASTRODUODENOSCOPY (EGD) WITH PROPOFOL;  Surgeon: Toledo, Boykin Nearing, MD;  Location: [facility] ENDOSCOPY;  Service: Gastroenterology;  Laterality: N/A;   GASTRIC BYPASS  2005   HERNIA REPAIR     IR BONE MARROW BIOPSY & ASPIRATION  02/05/2023   NASAL SINUS SURGERY     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   TOTAL THYROIDECTOMY     TUBAL LIGATION      Social History   Socioeconomic History  Marital status: Divorced    Spouse name: Not on file   Number of children: 2   Years of education: Not on file   Highest education level: GED or equivalent  Occupational History   Occupation: Part-time  Tobacco Use   Smoking status: Every Day    Current packs/day: 1.00    Average packs/day: 1 pack/day for 43.0 years (43.0 ttl pk-yrs)    Types: Cigarettes    Start date: 03/15/1980   Smokeless tobacco: Never  Vaping Use   Vaping status: Never Used  Substance and Sexual Activity   Alcohol use: Yes    Alcohol/week: 10.0 - 12.0 standard drinks of alcohol     Types: 8 - 10 Glasses of wine, 2 Standard drinks or equivalent per week   Drug use: No   Sexual activity: Yes    Partners: Male  Other Topics Concern   Not on file  Social History Narrative   Lives in [place] single, divorced       Work - 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       Diet - healthy diet   Exercise - limited   Caffeine use: daily   Social Determinants of Health   Financial Resource Strain: Low Risk  (11/01/2022)   Overall Financial Resource Strain (CARDIA)    Difficulty of Paying Living Expenses: Not very hard  Food Insecurity: No Food Insecurity (03/06/2023)   Hunger Vital Sign    Worried About Running Out of Food in the Last Year: Never true    Ran Out of Food in the Last Year: Never true  Transportation Needs: No Transportation Needs (03/06/2023)   PRAPARE - Administrator, Civil Service (Medical): No    Lack of Transportation (Non-Medical): No  Physical Activity: Insufficiently Active (11/01/2022)   Exercise Vital Sign    Days of Exercise per Week: 3 days    Minutes of Exercise per Session: 20 min  Stress: Stress Concern Present (03/04/2023)   [employer] of Occupational Health - Occupational Stress Questionnaire    Feeling of Stress : Very much  Social Connections: Socially Isolated (03/04/2023)   Social Connection and Isolation Panel [NHANES]    Frequency of Communication with Friends and Family: Never    Frequency of Social Gatherings with Friends and Family: Never    Attends Religious Services: Never    Database administrator or Organizations: No    Attends Banker Meetings: Never    Marital Status: Divorced  Catering manager Violence: Not At Risk (03/06/2023)   Humiliation, Afraid, Rape, and Kick questionnaire    Fear of Current or Ex-Partner: No    Emotionally Abused: No    Physically Abused: No    Sexually Abused: No    Family History  Problem Relation Age of Onset   Heart disease Mother    COPD Mother     Arthritis Mother    Cancer Mother        uterine   Lupus Mother    Anxiety disorder Mother    Depression Mother    Drug abuse Mother    COPD Father    Arthritis Father    Heart disease Father    Heart attack Father    Alcohol abuse Father    Heart disease Brother    Heart attack Brother    Drug abuse Brother    Anxiety disorder Brother    Depression Brother    Heart disease Brother    Cancer Brother  liver cancer age 5 y.o   Diabetes Maternal Grandmother    Diabetes Maternal Grandfather    Diabetes Paternal Grandmother    Diabetes Paternal Grandfather    Breast cancer Neg Hx    Allergies  Allergen Reactions   Hctz [Hydrochlorothiazide]     Hyponatremia    I? Current Facility-Administered Medications  Medication Dose Route Frequency Provider Last Rate Last Admin   acetaminophen (TYLENOL) tablet 650 mg  650 mg Oral Q6H PRN Cox, Amy N, DO       Or   acetaminophen (TYLENOL) suppository 650 mg  650 mg Rectal Q6H PRN Cox, Amy N, DO       amLODipine (NORVASC) tablet 10 mg  10 mg Oral Daily Sharen Hones, RPH   10 mg at 03/10/23 0827   butalbital-acetaminophen-caffeine (FIORICET) 50-325-40 MG per tablet 1 tablet  1 tablet Oral Q4H PRN Cox, Amy N, DO   1 tablet at 03/08/23 0553   clonazePAM (KLONOPIN) tablet 1 mg  1 mg Oral BID Cox, Amy N, DO   1 mg at 03/10/23 9629   docusate sodium (COLACE) capsule 100 mg  100 mg Oral BID Cox, Amy N, DO   100 mg at 03/10/23 0828   escitalopram (LEXAPRO) tablet 10 mg  10 mg Oral Daily Cox, Amy N, DO   10 mg at 03/10/23 0829   fentaNYL (DURAGESIC) 50 MCG/HR 1 patch  1 patch Transdermal Q72H Esaw Grandchild A, DO   1 patch at 03/08/23 1320   gabapentin (NEURONTIN) capsule 100 mg  100 mg Oral TID Cox, Amy N, DO   100 mg at 03/10/23 1626   HYDROmorphone (DILAUDID) injection 1 mg  1 mg Intravenous Q4H PRN Esaw Grandchild A, DO   1 mg at 03/10/23 1312   HYDROmorphone (DILAUDID) tablet 2-4 mg  2-4 mg Oral Q4H PRN Esaw Grandchild A, DO   2 mg  at 03/10/23 1457   ipratropium-albuterol (DUONEB) 0.5-2.5 (3) MG/3ML nebulizer solution 3 mL  3 mL Nebulization BID Cox, Amy N, DO   3 mL at 03/10/23 5284   levothyroxine (SYNTHROID) tablet 250 mcg  250 mcg Oral QAC breakfast Cox, Amy N, DO   250 mcg at 03/10/23 0515   mometasone-formoterol (DULERA) 200-5 MCG/ACT inhaler 2 puff  2 puff Inhalation BID Cox, Amy N, DO   2 puff at 03/10/23 0825   nicotine (NICODERM CQ - dosed in mg/24 hours) patch 21 mg  21 mg Transdermal Daily PRN Cox, Amy N, DO       ondansetron (ZOFRAN) tablet 4 mg  4 mg Oral Q6H PRN Cox, Amy N, DO       Or   ondansetron (ZOFRAN) injection 4 mg  4 mg Intravenous Q6H PRN Cox, Amy N, DO       pantoprazole (PROTONIX) EC tablet 40 mg  40 mg Oral BID AC Cox, Amy N, DO   40 mg at 03/10/23 1626   piperacillin-tazobactam (ZOSYN) IVPB 3.375 g  3.375 g Intravenous Q8H Barrie Folk, RPH 12.5 mL/hr at 03/10/23 1200 3.375 g at 03/10/23 1200   senna-docusate (Senokot-S) tablet 1 tablet  1 tablet Oral QHS PRN Cox, Amy N, DO       sodium bicarbonate tablet 650 mg  650 mg Oral TID Cox, Amy N, DO   650 mg at 03/10/23 1626   thiamine (VITAMIN B1) tablet 100 mg  100 mg Oral Daily Cox, Amy N, DO   100 mg at 03/10/23 1324   umeclidinium bromide (  INCRUSE ELLIPTA) 62.5 MCG/ACT 1 puff  1 puff Inhalation Daily Cox, Amy N, DO   1 puff at 03/10/23 0825   [START ON 03/11/2023] vancomycin (VANCOREADY) IVPB 1250 mg/250 mL  1,250 mg Intravenous Q48H Cordella Register A, RPH       zolpidem (AMBIEN) tablet 5 mg  5 mg Oral QHS PRN Cox, Amy N, DO   5 mg at 03/09/23 2033     Abtx:  Anti-infectives (From admission, onward)    Start     Dose/Rate Route Frequency Ordered Stop   03/11/23 2100  vancomycin (VANCOREADY) IVPB 1250 mg/250 mL        1,250 mg 166.7 mL/hr over 90 Minutes Intravenous Every 48 hours 03/10/23 1320     03/10/23 2100  vancomycin (VANCOREADY) IVPB 750 mg/150 mL  Status:  Discontinued        750 mg 150 mL/hr over 60 Minutes Intravenous  Every 24 hours 03/09/23 1832 03/10/23 1320   03/09/23 2100  vancomycin (VANCOREADY) IVPB 2000 mg/400 mL        2,000 mg 200 mL/hr over 120 Minutes Intravenous  Once 03/09/23 1830 03/10/23 0327   03/09/23 2000  piperacillin-tazobactam (ZOSYN) IVPB 3.375 g        3.375 g 12.5 mL/hr over 240 Minutes Intravenous Every 8 hours 03/09/23 1829     03/09/23 1545  doxycycline (VIBRA-TABS) tablet 100 mg  Status:  Discontinued        100 mg Oral Every 12 hours 03/09/23 1450 03/09/23 1825   03/09/23 1545  amoxicillin-clavulanate (AUGMENTIN) 875-125 MG per tablet 1 tablet  Status:  Discontinued        1 tablet Oral Every 12 hours 03/09/23 1450 03/09/23 1825   03/06/23 2200  amoxicillin-clavulanate (AUGMENTIN) 875-125 MG per tablet 1 tablet       Note to Pharmacy: Completing 7 day course.   1 tablet Oral Every 12 hours 03/06/23 1929 03/07/23 1348       REVIEW OF SYSTEMS:  Const: negative fever, negative chills, gained 30 pounds in the past month Eyes: negative diplopia or visual changes, negative eye pain ENT: negative coryza, negative sore throat Resp: + cough, -hemoptysis, +dyspnea Cards: left sided  chest pain, no palpitations, lower extremity edema GU: negative for frequency, dysuria and hematuria GI: Negative for abdominal pain, diarrhea, bleeding, constipation Skin: negative for rash and pruritus Heme: negative for easy bruising and gum/nose bleeding MS:  weakness Neurolo:negative for headaches, dizziness, vertigo, memory problems  Psych:  anxiety, depression  Endocrine: thyroid problem Allergy/Immunology-as above : Objective:  VITALS:  BP 137/69 (BP Location: Left Arm)   Pulse 72   Temp 98 F (36.7 C)   Resp 17   Ht 5\' 2"  (1.575 m)   Wt 92.7 kg   SpO2 93%   BMI 37.38 kg/m   PHYSICAL EXAM:  General: Alert, some distress,  appears stated age.  Head: Normocephalic, without obvious abnormality, atraumatic. Eyes: Conjunctivae clear, anicteric sclerae. Pupils are equal ENT Nares  normal. No drainage or sinus tenderness. Lips, mucosa, and tongue normal. No Thrush Neck: Supple, symmetrical, no adenopathy, thyroid: non tender no carotid bruit and no JVD. Back: No CVA tenderness. Lungs: decreased air entry lest side. Heart: Regular rate and rhythm, no murmur, rub or gallop. Abdomen: Soft, non-tender,not distended. Bowel sounds normal. No masses Extremities: atraumatic, no cyanosis.mild left ankle edema No clubbing Skin: No rashes or lesions. Or bruising Lymph: Cervical, supraclavicular normal. Neurologic: Grossly non-focal Pertinent Labs Lab Results CBC    Component  Value Date/Time   WBC 10.6 (H) 03/10/2023 0537   RBC 2.73 (L) 03/10/2023 0537   HGB 8.3 (L) 03/10/2023 0537   HGB 7.4 (L) 02/10/2023 2009   HCT 26.1 (L) 03/10/2023 0537   HCT 38.5 07/02/2011 1529   PLT 464 (H) 03/10/2023 0537   PLT 311 07/02/2011 1529   MCV 95.6 03/10/2023 0537   MCV 90 07/02/2011 1529   MCH 30.4 03/10/2023 0537   MCHC 31.8 03/10/2023 0537   RDW 15.2 03/10/2023 0537   RDW 13.4 07/02/2011 1529   LYMPHSABS 1.5 03/04/2023 1334   MONOABS 1.4 (H) 03/04/2023 1334   EOSABS 0.0 03/04/2023 1334   BASOSABS 0.1 03/04/2023 1334       Latest Ref Rng & Units 03/10/2023    5:37 AM 03/09/2023    3:27 AM 03/08/2023    2:59 AM  CMP  Glucose 70 - 99 mg/dL 161  86  85   BUN 6 - 20 mg/dL 15  12  14    Creatinine 0.44 - 1.00 mg/dL 0.96  0.45  4.09   Sodium 135 - 145 mmol/L 141  140  140   Potassium 3.5 - 5.1 mmol/L 4.2  3.4  3.6   Chloride 98 - 111 mmol/L 99  99  99   CO2 22 - 32 mmol/L 30  32  30   Calcium 8.9 - 10.3 mg/dL 8.0  7.8  8.0       Microbiology: Recent Results (from the past 240 hour(s))  Resp panel by RT-PCR (RSV, Flu A&B, Covid) Anterior Nasal Swab     Status: None   Collection Time: 03/06/23  6:38 PM   Specimen: Anterior Nasal Swab  Result Value Ref Range Status   SARS Coronavirus 2 by RT PCR NEGATIVE NEGATIVE Final    Comment: (NOTE) SARS-CoV-2 target nucleic  acids are NOT DETECTED.  The SARS-CoV-2 RNA is generally detectable in upper respiratory specimens during the acute phase of infection. The lowest concentration of SARS-CoV-2 viral copies this assay can detect is 138 copies/mL. A negative result does not preclude SARS-Cov-2 infection and should not be used as the sole basis for treatment or other patient management decisions. A negative result may occur with  improper specimen collection/handling, submission of specimen other than nasopharyngeal swab, presence of viral mutation(s) within the areas targeted by this assay, and inadequate number of viral copies(<138 copies/mL). A negative result must be combined with clinical observations, patient history, and epidemiological information. The expected result is Negative.  Fact Sheet for Patients:  BloggerCourse.com  Fact Sheet for Healthcare Providers:  SeriousBroker.it  This test is no t yet approved or cleared by the [place] FDA and  has been authorized for detection and/or diagnosis of SARS-CoV-2 by FDA under an Emergency Use Authorization (EUA). This EUA will remain  in effect (meaning this test can be used) for the duration of the COVID-19 declaration under Section 564(b)(1) of the Act, 21 U.S.C.section 360bbb-3(b)(1), unless the authorization is terminated  or revoked sooner.       Influenza A by PCR NEGATIVE NEGATIVE Final   Influenza B by PCR NEGATIVE NEGATIVE Final    Comment: (NOTE) The Xpert Xpress SARS-CoV-2/FLU/RSV plus assay is intended as an aid in the diagnosis of influenza from Nasopharyngeal swab specimens and should not be used as a sole basis for treatment. Nasal washings and aspirates are unacceptable for Xpert Xpress SARS-CoV-2/FLU/RSV testing.  Fact Sheet for Patients: BloggerCourse.com  Fact Sheet for Healthcare Providers: SeriousBroker.it  This  test is not yet approved or cleared by the [place] and has been authorized for detection and/or diagnosis of SARS-CoV-2 by FDA under an Emergency Use Authorization (EUA). This EUA will remain in effect (meaning this test can be used) for the duration of the COVID-19 declaration under Section 564(b)(1) of the Act, 21 U.S.C. section 360bbb-3(b)(1), unless the authorization is terminated or revoked.     Resp Syncytial Virus by PCR NEGATIVE NEGATIVE Final    Comment: (NOTE) Fact Sheet for Patients: BloggerCourse.com  Fact Sheet for Healthcare Providers: SeriousBroker.it  This test is not yet approved or cleared by the [place] FDA and has been authorized for detection and/or diagnosis of SARS-CoV-2 by FDA under an Emergency Use Authorization (EUA). This EUA will remain in effect (meaning this test can be used) for the duration of the COVID-19 declaration under Section 564(b)(1) of the Act, 21 U.S.C. section 360bbb-3(b)(1), unless the authorization is terminated or revoked.  Performed at [facility], [address] [place], [place] [address]   Pleural fluid culture w Gram Stain     Status: None (Preliminary result)   Collection Time: 03/07/23  1:15 PM   Specimen: Pleural Fluid  Result Value Ref Range Status   Specimen Description   Final    PLEURAL Performed at [facility], [address], [place], [place] [address]    Special Requests   Final    NONE Performed at [facility], [address] [place], [place] [address]    Gram Stain   Final    WBC PRESENT,BOTH PMN AND MONONUCLEAR NO ORGANISMS SEEN CYTOSPIN SMEAR    Culture   Final    NO GROWTH 3 DAYS Performed at [facility], [address] [place], [place] [address]    Report Status PENDING  Incomplete  MRSA Next Gen by PCR, Nasal     Status: Abnormal   Collection Time: 03/08/23 12:04 PM   Specimen: Nasal Mucosa;  Nasal Swab  Result Value Ref Range Status   MRSA by PCR Next Gen DETECTED (A) NOT DETECTED Final    Comment: RESULT CALLED TO, READ BACK BY AND VERIFIED WITH: Apolinar Junes 03/08/2023 1553CP (NOTE) The GeneXpert MRSA Assay (FDA approved for NASAL specimens only), is one component of a comprehensive MRSA colonization surveillance program. It is not intended to diagnose MRSA infection nor to guide or monitor treatment for MRSA infections. Test performance is not FDA approved in patients less than 34 years old. Performed at [facility], [address] [place], [place] [address]     IMAGING RESULTS:  CT chest showed loculated left pleural effusion ? 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 ? ___________________________________________________ Discussed with patient,and  requesting provider Note:  This document was prepared using Dragon voice recognition software and may include unintentional dictation errors.

## 2023-03-10 NOTE — Progress Notes (Addendum)
Progress Note   Patient: Paula Garrett WVP:[id number] DOB: 1965-12-15 DOA: 03/06/2023     4 DOS: the patient was seen and examined on 03/10/2023   Brief hospital course: HPI on admission 03/06/23:  "Daziya Cereceres is a 57 year old female with history of current tobacco use, recently diagnosed multiple myeloma status post cycle 1 of cyclophosphamide Velcade dexamethasone chemotherapy, history of COPD, who presents emergency department from outpatient cancer center for chief concerns of shortness of breath."  Vitals in the ED showed temperature of 98.3, respiration rate 24, heart rate of 73, blood pressure 149/133, SpO2 96% on 6 L nasal cannula.  Chest x-ray showed a large left pleural effusion over 50% of left hemithorax.  See H&P for full HPI on admission and detailed ER course.  Patient was in respiratory distress requiring BiPAP on admission.  Admitted to stepdown unit on BiPAP. Pulmonology and IR consulted for thoracentesis vs potential chest tube placement.  Further hospital course and management as outlined below.    Assessment and Plan: * Recurrent left pleural effusion Exudative pleural effusion Pulmonology performed bedside U/S-guided thoracentesis (9/20), 1600 cc fluid removed --Pulmonology following --Left chest tube placed 9/22 --IV lasix 20 mg x 2 (9/21, 9/22) - hold diuresis, Cr rising. --Infectious disease consulted --Follow pleural fluid cultures and cytology.  --Echo preserved EF, grade I diastolic dysfunction --CXR this AM shows b/l pleural effusions, left re-accumulating since thoracentesis --Discussed case with ID on call this weekend, as pt had recent pneumonia, just completed her course of antibiotics.  ID recommended either monitoring off antibiotics and follow cultures, or resume Augmentin.  No need for broad spectrum coverage. --MRSA screen positive --Will start on empiric Doxy and Augmentin pending formal ID consult tomorrow --Follow pending legionella  antigen --Pulmonology considering bronchoscopy for further evaluation if not improvng  COPD with acute exacerbation (HCC) Seems more likely due to left pleural effusion.  Pt not wheezing on exam. Given Solu-medrol in ED. Defer further steroids for now. Pulmonology following. Continue Duonebs QID Dulera & Incruse  Acute on chronic respiratory failure with hypoxemia (HCC) Pt recently discharged on home oxygen. Presented in respiratory distress requiring BiPAP. Weaned to 4 L/min (9/23) Secondary to recurrent left sided pleural effusion complicated by patient with severe COPD. 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   --Monitor CBC  Anxiety Resumed on home Lexapro, Klonopin BID  OSA (obstructive sleep apnea) Not on CPAP  Tobacco use disorder Nicotine patch ordered  Hypothyroidism Synthroid  Benign essential hypertension Continue home amlodipine PRN hydralazine  Barrett's esophagus without dysplasia Protonix  Hypomagnesemia Mg 1.6 - replacing with 2 g IV Mg-sulfate Monitor & replace Mg as needed.  SOB (shortness of breath) Mostly due to large left pleural effusion. Initially required BiPAP. Improved after thoracentesis.  Status post bariatric surgery No acute issues. Monitor  Postoperative hypothyroidism .  Obesity (BMI 30-39.9) Body mass index is 37.38 kg/m. Complicates overall care and prognosis.  Recommend lifestyle modifications including physical  activity and diet for weight loss and overall long-term health.  Subjective: Pt having a great deal of pain from chest tube, severe with any deep breathing.  Otherwise says breathing feels okay today. No fever or chills.  States she's never been one to mount a fever when she's had infections or prior pneumonia.  Does state that before she became ill a while back, she'd been bitten by a tick and had an itchy patch in that area for days afterward.     Physical Exam: Vitals:   03/09/23 2057 03/09/23 2244 03/10/23 0809 03/10/23 1625  BP:  134/78 130/71 137/69  Pulse:  88 72 72  Resp:  18 18 17   Temp:  98 F (36.7 C)  98.4 F (36.9 C)  TempSrc:    Oral  SpO2: 94% 95% 97% 93%  Weight:      Height:       General exam: awake, alert, no acute distress HEENT: moist mucus membranes, hearing grossly normal  Respiratory system: left chest tube in place with serosanguinous fluid in chamber, diminished bases L>R, normal respiratory effort at rest, on 4 L/min [place] o2. Cardiovascular system: normal S1/S2, RRR, trace LE edema.   Gastrointestinal system: soft, NT, ND Central nervous system: A&O x 3. no gross focal neurologic deficits, normal speech Extremities: moves all, no edema, normal tone Skin: dry, intact, normal temperature Psychiatry: normal mood, congruent affect, judgement and insight appear normal   Data Reviewed:  Notable labs --- Cr 1.46 >> 1.76 (post diuresis), 6, Ca 7.8, Hbg 7.6 >> 8.1, platelets 493k  Strep pneumo antigen negative  Pending - pleural fluid cultures & cytology   Family Communication: female friend at bedside on rounds 9.22, on phone today 9/23.    Disposition: Status is: Inpatient Remains inpatient appropriate because: severity of illness, chest tube in place, cultures and ID consult pending, ongoing O2 needs above baseline   Planned Discharge Destination: Home and Home with Home Health    Time spent: 45 minutes  Author: Pennie Banter,  DO 03/10/2023 6:03 PM  For on call review [email or web address].

## 2023-03-10 NOTE — Plan of Care (Signed)

## 2023-03-11 ENCOUNTER — Ambulatory Visit: Payer: Managed Care, Other (non HMO)

## 2023-03-11 ENCOUNTER — Inpatient Hospital Stay: Payer: Managed Care, Other (non HMO)

## 2023-03-11 DIAGNOSIS — E876 Hypokalemia: Secondary | ICD-10-CM | POA: Diagnosis not present

## 2023-03-11 DIAGNOSIS — J90 Pleural effusion, not elsewhere classified: Secondary | ICD-10-CM | POA: Diagnosis not present

## 2023-03-11 LAB — BODY FLUID CULTURE W GRAM STAIN

## 2023-03-11 LAB — CBC
HCT: 25.5 % — ABNORMAL LOW (ref 36.0–46.0)
Hemoglobin: 8.1 g/dL — ABNORMAL LOW (ref 12.0–15.0)
MCH: 30 pg (ref 26.0–34.0)
MCHC: 31.8 g/dL (ref 30.0–36.0)
MCV: 94.4 fL (ref 80.0–100.0)
Platelets: 391 10*3/uL (ref 150–400)
RBC: 2.7 MIL/uL — ABNORMAL LOW (ref 3.87–5.11)
RDW: 15.2 % (ref 11.5–15.5)
WBC: 10.5 10*3/uL (ref 4.0–10.5)
nRBC: 0 % (ref 0.0–0.2)

## 2023-03-11 LAB — BASIC METABOLIC PANEL
Anion gap: 11 (ref 5–15)
BUN: 17 mg/dL (ref 6–20)
CO2: 29 mmol/L (ref 22–32)
Calcium: 8.4 mg/dL — ABNORMAL LOW (ref 8.9–10.3)
Chloride: 99 mmol/L (ref 98–111)
Creatinine, Ser: 1.84 mg/dL — ABNORMAL HIGH (ref 0.44–1.00)
GFR, Estimated: 31 mL/min — ABNORMAL LOW (ref >60)
Glucose, Bld: 108 mg/dL — ABNORMAL HIGH (ref 70–99)
Potassium: 3.7 mmol/L (ref 3.5–5.1)
Sodium: 139 mmol/L (ref 135–145)

## 2023-03-11 LAB — EXPECTORATED SPUTUM ASSESSMENT W GRAM STAIN, RFLX TO RESP C

## 2023-03-11 LAB — MAGNESIUM: Magnesium: 1.9 mg/dL (ref 1.7–2.4)

## 2023-03-11 MED ORDER — CHLORHEXIDINE GLUCONATE CLOTH 2 % EX PADS
6.0000 | MEDICATED_PAD | Freq: Every day | CUTANEOUS | Status: AC
  Administered 2023-03-12 – 2023-03-16 (×5): 6 via TOPICAL

## 2023-03-11 MED ORDER — SENNOSIDES-DOCUSATE SODIUM 8.6-50 MG PO TABS
1.0000 | ORAL_TABLET | Freq: Two times a day (BID) | ORAL | Status: DC
  Administered 2023-03-11 – 2023-03-16 (×8): 1 via ORAL
  Filled 2023-03-11 (×10): qty 1

## 2023-03-11 MED ORDER — BISACODYL 5 MG PO TBEC: 5.0000 mg | DELAYED_RELEASE_TABLET | Freq: Every day | ORAL | Status: DC | PRN

## 2023-03-11 MED ORDER — MUPIROCIN 2 % EX OINT
1.0000 | TOPICAL_OINTMENT | Freq: Two times a day (BID) | CUTANEOUS | Status: AC
  Administered 2023-03-11 – 2023-03-13 (×3): 1 via NASAL
  Filled 2023-03-11: qty 22

## 2023-03-11 MED ORDER — POLYETHYLENE GLYCOL 3350 17 G PO PACK
17.0000 g | PACK | Freq: Every day | ORAL | Status: DC
  Administered 2023-03-11 – 2023-03-12 (×2): 17 g via ORAL
  Filled 2023-03-11 (×3): qty 1

## 2023-03-11 MED ORDER — IPRATROPIUM-ALBUTEROL 0.5-2.5 (3) MG/3ML IN SOLN
3.0000 mL | Freq: Four times a day (QID) | RESPIRATORY_TRACT | Status: DC
  Administered 2023-03-11 – 2023-03-14 (×12): 3 mL via RESPIRATORY_TRACT
  Filled 2023-03-11 (×10): qty 3

## 2023-03-11 NOTE — Plan of Care (Signed)

## 2023-03-11 NOTE — Consult Note (Signed)
Subjective:  Follow up pleural effusion and chest tube placement S/p Thoracentesis on 09/20 with 1600cc Orange colored fluid drained.  Still feels bad   Remains on 6L [place]    Physical Exam  GEN no acute distress HEENT supple neck, reactive pupils, EOMI CVS normal S1, normal S2, regular rate and rhythm, no murmurs or extra sounds appreciated Lungs Diminshed breath sounds at the basis.   Abdomen soft nontender nondistended positive bowel sounds Extremities warm well-perfused no edema   Labs and imaging were reviewed   Assessment and plan 57 year old female patient with a recent diagnosis of multiple myeloma, history of presumed COPD/asthma on Breztri no PFTs on file who presented to [facility]/19 with worsening shortness of breath. Pulmonary team is being consulted for large left pleural effusion.    # Large left pleural effusion and small right pleural effusion s/p L thora 09/20.  #Now with recurrence left pleural effusion   Left CT placement Minimal Output  +LIGHTS criteria Exudative in nature with lymphocytic predominance. unclear differential at the moment. Complicated parapneumonic effusion remanis possible, less likely multiple myeloma related effusion as it happens and only about 5% of cases. Fluid overload secondary to grade 1 diastolic dysfunction is less likely now given nature of effusion. Cyclophosphamide tox can cause pleural effusions.     #DPLD with now right sided patchy opacities - [place] with right consolidation.  could represent opportunistic infection in this otherwise immunosuppressed patient. Or pulmonary edema. Another differential would be Cyclophosphamide induced lung toxicity which can cause both pleural effusion and pulmonary edema.    # Multiple myeloma status post cycle 1 cyclophosphamide Velcade and dexamethasone on 09/01 CYTOLOGY PENDING  []  ID  []  Infectious work-up including MRSA Swab +, urine strep negative and legionella  Pending and Beta-D Glucan. []  Pending culture and cytology.  []  Should she not improve with diuresis and opacities persist  Will plan for [facility] WITH BAL on Thursday 9/26 AM, patient has verbally consented to Bronch.   The Risks and Benefits of the Bronchoscopy procedure with BAL were explained to patient.  I have discussed the risk for Acute Bleeding, increased chance of Infection, increased chance of Respiratory Failure and Cardiac Arrest, increased chance of pneumothorax and collapsed lung  I have also explained to avoid all types of NSAIDs 1 week prior to procedure date  to decrease chance of bleeding, and also to avoid food and drinks the midnight prior to procedure.  The procedure consists of a video camera with a light source to be placed and inserted  into the lungs to  look for abnormal tissue and to obtain tissue samples by using needle and biopsy tools.  The patient understand the risks and benefits and have agreed to proceed with procedure.       Lucie Leather, M.D.  [facility]  Medical Director [facility] Medical Director [facility] Cardio-Pulmonary Department

## 2023-03-11 NOTE — Progress Notes (Addendum)
Progress Note   Patient: Paula Garrett QIH:[id number] DOB: Sep 22, 1965 DOA: 03/06/2023     5 DOS: the patient was seen and examined on 03/11/2023   Brief hospital course: HPI on admission 03/06/23:  "Paula Garrett is a 57 year old female with history of current tobacco use, recently diagnosed multiple myeloma status post cycle 1 of cyclophosphamide Velcade dexamethasone chemotherapy, history of COPD, who presents emergency department from outpatient cancer center for chief concerns of shortness of breath."  Vitals in the ED showed temperature of 98.3, respiration rate 24, heart rate of 73, blood pressure 149/133, SpO2 96% on 6 L nasal cannula.  Chest x-ray showed a large left pleural effusion over 50% of left hemithorax.  See H&P for full HPI on admission and detailed ER course.  Patient was in respiratory distress requiring BiPAP on admission.  Admitted to stepdown unit on BiPAP. Pulmonology and IR consulted for thoracentesis vs potential chest tube placement.  Further hospital course and management as outlined below.    Assessment and Plan: * Recurrent left pleural effusion Exudative pleural effusion Pulmonology performed bedside U/S-guided thoracentesis (9/20), 1600 cc fluid removed --Pulmonology and ID are following --Left chest tube placed 9/22 --IV lasix 20 mg x 2 (9/21, 9/22) - hold diuresis, Cr rising. --Follow pleural fluid cultures and cytology.  --Echo preserved EF, grade I diastolic dysfunction --CT chest today shows interval improvement in left effusion --Pulm reached out to IR to assess for chest tube removal --Follow up consultant recommendations --ID changed Vanc >> Linezolid 9/23 --Currently on Linezolid and Zosyn  COPD with acute exacerbation (HCC) Seems more likely due to left pleural effusion.  Pt not wheezing on exam. Given Solu-medrol in ED. Defer further steroids for now. Pulmonology following. Continue Duonebs QID Dulera & Incruse  Acute on chronic  respiratory failure with hypoxemia (HCC) Pt recently discharged on home oxygen. Presented in respiratory distress requiring BiPAP. Weaned to 4 L/min (9/23) Secondary to recurrent left sided pleural effusion complicated by patient with severe COPD. 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   --Resumed on home regimen - gabapentin, fentanyl patch (last placed 1700 on 9/18 at home, confirmed by family), PO dilaudid PRN --IV dilaudid for breakthrough pain -- requiring for chest tube pain at this time  AKI (acute kidney injury) (HCC) Baseline renal function unclear, has been recovering from severe AKI related to multiple myeloma, steadily improving since starting treatment.  Mild AKI currently due to diuresis most likely. 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   --Monitor CBC  Anxiety Resumed on home Lexapro, Klonopin BID  OSA (obstructive sleep apnea) Not on CPAP  Tobacco use disorder Nicotine patch ordered  Hypothyroidism Synthroid  Benign essential hypertension Continue home amlodipine PRN hydralazine  Barrett's esophagus without dysplasia Protonix  Hypokalemia Mild, due to diuresis. K was replaced. Monitor & replace K as needed  Hypomagnesemia Mg 1.6 on 9/24 was replaced Monitor & replace Mg as needed.  SOB (shortness of breath) Mostly due to large left pleural effusion. Initially required BiPAP. Improved after thoracentesis.  Status post bariatric surgery No acute issues. Monitor  Postoperative hypothyroidism .  Obesity (BMI 30-39.9) Body mass index is 37.38 kg/m. Complicates overall care and prognosis.  Recommend  lifestyle modifications including physical activity and diet for weight loss and overall long-term health.        Subjective: Pt says she doesn't feel as well today, very tired.  Breathing stable and okay.  Ongoing pain from chest tube. Wants to get pain med and take a nap.  No other acute or new complaints at this time.   Physical Exam: Vitals:   03/11/23 0939 03/11/23 1034 03/11/23 1350 03/11/23 1509  BP: (!) 120/57 135/72  130/64  Pulse: 85 73 66 74  Resp: 17  18 14   Temp: 98.1 F (36.7 C)   98.2 F (36.8 C)  TempSrc: Oral     SpO2: 96% 98% 94% 95%  Weight:      Height:       General exam: awake, appears drowsy, no acute distress, mildly ill appearing HEENT: moist mucus membranes, hearing grossly normal  Respiratory system: left chest tube in place with serosanguinous fluid in chamber, diminished bases L>R, normal respiratory effort at rest, on 4 L/min  o2. Cardiovascular system: normal S1/S2, RRR, trace LE edema.   Gastrointestinal system: soft, NT, ND Central nervous system: A&O x 3. no gross focal neurologic deficits, normal speech Extremities: moves all, no edema, normal tone Skin: dry, intact, normal temperature Psychiatry: normal mood, congruent affect, judgement and insight appear normal   Data Reviewed:  Notable labs --- Cr 1.46 >> 1.76 (post diuresis) >> 1.84, 6, Ca 8.4, Hbg 8.1  Strep pneumo & legionella antigen negative  Pending - pleural fluid cultures & cytology, aspergillus Ag, fungitell  CT chest today -- improved L pleural effusion IMPRESSION: 1. Interval decrease in pleural effusion following LEFT chest tube placement. 2. Persistent bibasilar atelectasis versus infiltrates. 3. Aggressive LEFT paraspinal mass which invades the adjacent vertebral body, encroaches upon the central canal, and erodes the medial LEFT seventh rib. Pathology indicates plasmacytoma.  Family Communication: none present on rounds today. Pt able to update.     Disposition: Status is: Inpatient Remains inpatient appropriate because: severity of illness, chest tube in place, cultures pending, ongoing O2 needs above baseline   Planned Discharge Destination: Home and Home with Home Health    Time spent: 45 minutes  Author: Pennie Banter, DO 03/11/2023 3:32 PM  For on call review [email or web address].

## 2023-03-11 NOTE — Assessment & Plan Note (Signed)
Mild, due to diuresis. K was replaced. Monitor & replace K as needed

## 2023-03-11 NOTE — Assessment & Plan Note (Signed)
Baseline renal function unclear, has been recovering from severe AKI related to multiple myeloma, steadily improving since starting treatment.  Mild AKI currently due to diuresis most likely. Cr trend: 1.46 >> 1.76 >> 1.84 --Holding further diuresis --Monitor BMP daily to trend

## 2023-03-12 ENCOUNTER — Ambulatory Visit: Payer: Managed Care, Other (non HMO)

## 2023-03-12 ENCOUNTER — Inpatient Hospital Stay: Payer: Managed Care, Other (non HMO)

## 2023-03-12 ENCOUNTER — Encounter: Payer: Managed Care, Other (non HMO) | Admitting: Neurosurgery

## 2023-03-12 DIAGNOSIS — J90 Pleural effusion, not elsewhere classified: Secondary | ICD-10-CM | POA: Diagnosis not present

## 2023-03-12 DIAGNOSIS — J9601 Acute respiratory failure with hypoxia: Secondary | ICD-10-CM

## 2023-03-12 DIAGNOSIS — B9562 Methicillin resistant Staphylococcus aureus infection as the cause of diseases classified elsewhere: Secondary | ICD-10-CM | POA: Diagnosis not present

## 2023-03-12 DIAGNOSIS — C9000 Multiple myeloma not having achieved remission: Secondary | ICD-10-CM | POA: Diagnosis not present

## 2023-03-12 LAB — BASIC METABOLIC PANEL
Anion gap: 9 (ref 5–15)
BUN: 18 mg/dL (ref 6–20)
CO2: 30 mmol/L (ref 22–32)
Calcium: 8.5 mg/dL — ABNORMAL LOW (ref 8.9–10.3)
Chloride: 100 mmol/L (ref 98–111)
Creatinine, Ser: 1.74 mg/dL — ABNORMAL HIGH (ref 0.44–1.00)
GFR, Estimated: 33 mL/min — ABNORMAL LOW (ref >60)
Glucose, Bld: 98 mg/dL (ref 70–99)
Potassium: 3.2 mmol/L — ABNORMAL LOW (ref 3.5–5.1)
Sodium: 139 mmol/L (ref 135–145)

## 2023-03-12 LAB — CBC
HCT: 24 % — ABNORMAL LOW (ref 36.0–46.0)
Hemoglobin: 7.7 g/dL — ABNORMAL LOW (ref 12.0–15.0)
MCH: 30.4 pg (ref 26.0–34.0)
MCHC: 32.1 g/dL (ref 30.0–36.0)
MCV: 94.9 fL (ref 80.0–100.0)
Platelets: 328 10*3/uL (ref 150–400)
RBC: 2.53 MIL/uL — ABNORMAL LOW (ref 3.87–5.11)
RDW: 15.2 % (ref 11.5–15.5)
WBC: 8.6 10*3/uL (ref 4.0–10.5)
nRBC: 0 % (ref 0.0–0.2)

## 2023-03-12 LAB — LEGIONELLA PNEUMOPHILA SEROGP 1 UR AG: L. pneumophila Serogp 1 Ur Ag: NEGATIVE

## 2023-03-12 LAB — CHOLESTEROL, BODY FLUID: Cholesterol, Fluid: 84 mg/dL

## 2023-03-12 MED ORDER — BUTAMBEN-TETRACAINE-BENZOCAINE 2-2-14 % EX AERO
1.0000 | INHALATION_SPRAY | CUTANEOUS | Status: DC | PRN
  Filled 2023-03-12: qty 0.3

## 2023-03-12 MED ORDER — POTASSIUM CHLORIDE CRYS ER 20 MEQ PO TBCR
40.0000 meq | EXTENDED_RELEASE_TABLET | ORAL | Status: AC
  Administered 2023-03-12 (×2): 40 meq via ORAL
  Filled 2023-03-12 (×2): qty 2

## 2023-03-12 MED ORDER — LIDOCAINE HCL (PF) 1 % IJ SOLN
5.0000 mL | Freq: Once | INTRAMUSCULAR | Status: DC
  Filled 2023-03-12: qty 5

## 2023-03-12 NOTE — Plan of Care (Signed)

## 2023-03-12 NOTE — Progress Notes (Addendum)
Date of Admission:  03/06/2023      ID: Paula Garrett is a 57 y.o. 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 female with a history of gastric bypass, Multiple myeloma, hypothyroidism.had chest pain and sob in Aug 2024 and found to have a large soft tissue mass Left T6-T7 paraspinal area with destruction 7th rib and invasion of T6-T7 vertebral bodiesT underwent T4-T9 fusion and T6-T7 laminotomy ( pathology plasma cell neoplasm). anemia, OSA, hypothyroidism, COPD, last admission to [facility] 02/23/23-02/27/23 for hypoxic, hypercapneic resp failure, pneunmonia, left pleural effusion and underwent thoracentesis and culture neg, sent on augmentin and home oxygen presented to the Onc clinic on 9/19 with SOB abd sats in 70s and was sent ot the ED   Subjective: Patient is feeling slightly better Still has left-sided chest pain Cough present Medications:   amLODipine  10 mg Oral Daily   Chlorhexidine Gluconate Cloth  6 each Topical Q0600   clonazePAM  1 mg Oral BID   docusate sodium  100 mg Oral BID   escitalopram  10 mg Oral Daily   fentaNYL  1 patch Transdermal Q72H   gabapentin  100 mg Oral TID   ipratropium-albuterol  3 mL Nebulization Q6H   levothyroxine  250 mcg Oral QAC breakfast   [START ON 03/13/2023] lidocaine (PF)  5 mL Infiltration Once   mometasone-formoterol  2 puff Inhalation BID   mupirocin ointment  1 Application Nasal  BID   pantoprazole  40 mg Oral BID AC   polyethylene glycol  17 g Oral Daily   senna-docusate  1 tablet Oral BID   sodium bicarbonate  650 mg Oral TID   thiamine  100 mg Oral Daily   umeclidinium bromide  1 puff Inhalation Daily    Objective: Vital signs in last 24 hours: Patient Vitals for the past 24 hrs:  BP Temp Pulse Resp SpO2  03/12/23 2055 -- -- -- -- 97 %  03/12/23 1554 128/72 98 F (36.7 C) 66 16 94 %  03/12/23 0815 (!) 156/74 (!) 97.4 F (36.3 C) 70 16 97 %  03/12/23 0238 -- -- -- -- 94 %  03/11/23 2351 127/65 97.9 F (36.6 C) 76 16 94 %     LDA Foley Central lines Other catheters  PHYSICAL EXAM:  General: Alert, cooperative, no distress, appears stated age.  Lungs: decreased air entry left side Chest drain  Heart: Regular rate and rhythm, no murmur, rub or gallop. Abdomen: Soft, non-tender,not distended. Bowel sounds normal. No masses Extremities: atraumatic, no cyanosis. No edema. No clubbing Skin: No rashes or lesions. Or bruising Lymph: Cervical, supraclavicular normal. Neurologic: Grossly non-focal Surgical scar back- healed  Lab Results    Latest Ref Rng & Units 03/12/2023    5:07 AM 03/11/2023    5:23 AM 03/10/2023    5:37 AM  CBC  WBC 4.0 - 10.5 K/uL 8.6  10.5  10.6   Hemoglobin 12.0 - 15.0 g/dL 7.7  8.1  8.3   Hematocrit 36.0 - 46.0 % 24.0  25.5  26.1   Platelets 150 - 400 K/uL 328  391  464        Latest Ref Rng & Units 03/12/2023    5:07 AM 03/11/2023    5:23 AM 03/10/2023    5:37 AM  CMP  Glucose 70 - 99 mg/dL 98  782  956   BUN 6 - 20 mg/dL 18  17  15    Creatinine 0.44 - 1.00 mg/dL 2.13  0.86  5.78   Sodium 135 - 145 mmol/L 139  139  141   Potassium 3.5 - 5.1 mmol/L 3.2  3.7  4.2   Chloride 98 - 111 mmol/L 100  99  99   CO2 22 - 32 mmol/L 30  29  30    Calcium 8.9 - 10.3 mg/dL 8.5  8.4  8.0       Microbiology:  Studies/Results:   CXR from today- improved aeration of left lung compared to 9/19 Assessment/Plan:  Acute hypoxic  respiratory failure secondary to left loculated pleural effusion.  Rule out empyema.  Lymphocytic predominance.  Thoracentesis done on 03/09/2023 showed monocytic lymphocytic predominance.  Culture negative so far.  On Zosyn and linezolid Sputum culture is pending MRSA nares present She has left-sided chest drain  Left-sided consolidation Rule out MRSA pneumonia Patient is going for BAL tomorrow and cytology will be sent Pro-Cal is on the lower side at 0.13  Multiple myeloma had received 2 doses of chemotherapy with Velcade and cyclophosphamide last.  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

## 2023-03-12 NOTE — TOC Progression Note (Signed)
Transition of Care [facility]) - Progression Note    Patient Details  Name: Paula Garrett MRN: [id number] Date of Birth: 1966-05-05  [facility] of [facility]) CM/SW Contact  Marlowe Sax, RN Phone Number: 03/12/2023, 11:27 AM  Clinical Narrative:    The patient was discharged home on home oxygen 9/12, now has chest tube in place on 4 liters of O2  PMH of Multiple Myeloma, HTN, Hypothyroidism, Asthma,   Current smoker  TOC will continue to follow and assist with DC plan and needs   Expected Discharge Plan: Home/Self Care Barriers to Discharge: Continued Medical Work up  Expected Discharge Plan and Services   Discharge Planning Services: CM Consult Post Acute Care Choice: Durable Medical Equipment, Home Health Living arrangements for the past 2 months: Apartment                           HH Arranged: NA           Social Determinants of Health (SDOH) Interventions SDOH Screenings   Food Insecurity: No Food Insecurity (03/06/2023)  Housing: Low Risk  (03/06/2023)  Transportation Needs: No Transportation Needs (03/06/2023)  Utilities: Not At Risk (03/06/2023)  Alcohol Screen: Low Risk  (01/01/2023)  Depression (PHQ2-9): High Risk (03/04/2023)  Financial Resource Strain: Low Risk  (11/01/2022)  Physical Activity: Insufficiently Active (11/01/2022)  Social Connections: Socially Isolated (03/04/2023)  Stress: Stress Concern Present (03/04/2023)  Tobacco Use: High Risk (03/06/2023)  Health Literacy: Adequate Health Literacy (03/04/2023)    Readmission Risk Interventions    02/24/2023   11:28 AM 02/12/2023    3:19 PM  Readmission Risk Prevention Plan  Transportation Screening Complete Complete  Medication Review (RN Care Manager) Referral to Pharmacy Complete  PCP or Specialist appointment within 3-5 days of discharge Complete   HRI or Home Care Consult Complete Complete  SW Recovery Care/Counseling Consult Complete Complete  Palliative Care Screening Not Applicable  Complete  Skilled Nursing Facility Not Applicable Not Applicable

## 2023-03-12 NOTE — Plan of Care (Signed)
  Problem: Education: Goal: Ability to verbalize activity precautions or restrictions will improve Outcome: Progressing Goal: Knowledge of the prescribed therapeutic regimen will improve Outcome: Progressing   Problem: Activity: Goal: Ability to tolerate increased activity will improve Outcome: Progressing Goal: Will remain free from falls Outcome: Progressing   Problem: Clinical Measurements: Goal: Postoperative complications will be avoided or minimized Outcome: Progressing   Problem: Bladder/Genitourinary: Goal: Urinary functional status for postoperative course will improve Outcome: Progressing   Problem: Education: Goal: Knowledge of General Education information will improve Description: Including pain rating scale, medication(s)/side effects and non-pharmacologic comfort measures Outcome: Progressing   Problem: Nutrition: Goal: Adequate nutrition will be maintained Outcome: Progressing   Problem: Safety: Goal: Ability to remain free from injury will improve Outcome: Progressing   Problem: Skin Integrity: Goal: Risk for impaired skin integrity will decrease Outcome: Progressing

## 2023-03-12 NOTE — Progress Notes (Signed)
Progress Note   Patient: Paula Garrett YNW:[id number] DOB: 11/16/1965 DOA: 03/06/2023     6 DOS: the patient was seen and examined on 03/12/2023   Subjective:  Patient seen and examined at bedside this morning.  She still complains of some discomfort.  Creatinine slightly elevated.  Potassium of 3.2 which have been repleted.  Oxygen requirement still at 3 L intranasal oxygen.  Still has chest tube in place and she is worried that she might have pulled on the chest tube.  I have requested chest x-ray to evaluate chest tube positioning.  Brief hospital course: HPI on admission 03/06/23:  "Paula Garrett is a 57 year old female with history of current tobacco use, recently diagnosed multiple myeloma status post cycle 1 of cyclophosphamide Velcade dexamethasone chemotherapy, history of COPD, who presents emergency department from outpatient cancer center for chief concerns of shortness of breath."   Vitals in the ED showed temperature of 98.3, respiration rate 24, heart rate of 73, blood pressure 149/133, SpO2 96% on 6 L nasal cannula.  Chest x-ray showed a large left pleural effusion over 50% of left hemithorax.   See H&P for full HPI on admission and detailed ER course.  Patient was in respiratory distress requiring BiPAP on admission.   Admitted to stepdown unit on BiPAP. Pulmonology and IR consulted for thoracentesis vs potential chest tube placement.   Further hospital course and management as outlined below.       Assessment and Plan: * Recurrent left pleural effusion Exudative pleural effusion Pulmonology performed bedside U/S-guided thoracentesis (9/20), 1600 cc fluid removed --Pulmonology and ID are following --Left chest tube placed 9/22 --IV lasix 20 mg x 2 (9/21, 9/22) - hold diuresis, Cr rising. --Follow pleural fluid cultures and cytology.  --Echo preserved EF, grade I diastolic dysfunction --CT chest today shows interval improvement in left effusion --Pulm reached out to IR  to assess for chest tube removal --Follow up consultant recommendations --ID changed Vanc >> Linezolid 9/23 --Currently on Linezolid and Zosyn Follow-up on chest x-ray follow-up chest tube positioning   COPD with acute exacerbation (HCC) Seems more likely due to left pleural effusion.  Pt not wheezing on exam. Given Solu-medrol in ED. Defer further steroids for now. Pulmonology following. Continue Duonebs QID Dulera & Incruse   Acute on chronic respiratory failure with hypoxemia (HCC) Pt recently discharged on home oxygen. Presented in respiratory distress requiring BiPAP. Currently on 3 L intranasal oxygen Secondary to recurrent left sided pleural effusion complicated by patient with severe COPD. 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   --Resumed on home regimen - gabapentin, fentanyl patch (last placed 1700 on 9/18 at home, confirmed by family), PO dilaudid PRN -- Continue IV dilaudid for breakthrough pain -- requiring for chest tube pain at this time   AKI (acute kidney injury) (HCC) Baseline renal function unclear, has been recovering from severe AKI related to multiple myeloma, steadily improving since starting treatment.  Mild AKI currently due to diuresis most likely. 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   -Continue to monitor CBC closely   Anxiety Continue on home Lexapro, Klonopin BID   OSA (obstructive sleep apnea) Not on CPAP   Tobacco  use disorder Nicotine patch ordered   Hypothyroidism Continue Synthroid   Benign essential hypertension Continue home amlodipine PRN hydralazine   Barrett's esophagus without  dysplasia Protonix   Hypokalemia Mild, due to diuresis. K was replaced. Monitor & replace K as needed   Hypomagnesemia Mg 1.6 on 9/24 was replaced Monitor & replace Mg as needed.   SOB (shortness of breath) Mostly due to large left pleural effusion. Initially required BiPAP. Improved after thoracentesis.   Status post bariatric surgery No acute issues. Monitor   Postoperative hypothyroidism .   Obesity (BMI 30-39.9) Body mass index is 37.38 kg/m. Complicates overall care and prognosis.  Recommend lifestyle modifications including physical activity and diet for weight loss and overall long-term health.       Physical Exam: General exam: awake, appears drowsy, no acute distress, mildly ill appearing HEENT: moist mucus membranes, hearing grossly normal  Respiratory system: Chest tubes in place in the left lung in place with serosanguinous fluid in chamber, diminished bases L>R, normal respiratory effort at rest, on 4 L/min [place] o2. Cardiovascular system: normal S1/S2, RRR, trace LE edema.   Gastrointestinal system: soft, NT, ND Central nervous system: A&O x 3. no gross focal neurologic deficits, normal speech Extremities: moves all, no edema, normal tone Skin: dry, intact, normal temperature Psychiatry: normal mood, congruent affect, judgement and insight appear normal     Data Reviewed: I have reviewed the labs as shown below as well as vitals      Latest Ref Rng & Units 03/12/2023    5:07 AM 03/11/2023    5:23 AM 03/10/2023    5:37 AM  CBC  WBC 4.0 - 10.5 K/uL 8.6  10.5  10.6   Hemoglobin 12.0 - 15.0 g/dL 7.7  8.1  8.3   Hematocrit 36.0 - 46.0 % 24.0  25.5  26.1   Platelets 150 - 400 K/uL 328  391  464        Latest Ref Rng & Units 03/12/2023    5:07 AM 03/11/2023    5:23 AM 03/10/2023    5:37 AM  BMP  Glucose 70 - 99 mg/dL 98  621  308   BUN 6 - 20 mg/dL 18  17  15    Creatinine 0.44 - 1.00 mg/dL 6.57  8.46  9.62   Sodium 135 - 145 mmol/L 139  139  141    Potassium 3.5 - 5.1 mmol/L 3.2  3.7  4.2   Chloride 98 - 111 mmol/L 100  99  99   CO2 22 - 32 mmol/L 30  29  30    Calcium 8.9 - 10.3 mg/dL 8.5  8.4  8.0     Strep pneumo & legionella antigen negative   Pending - pleural fluid cultures & cytology, aspergillus Ag, fungitell    Family Communication: none present on rounds today. Pt able to update.     Disposition: Status is: Inpatient Remains inpatient appropriate because: severity of illness, chest tube in place, cultures pending, ongoing O2 needs above baseline    Planned Discharge Destination: Home and Home with Home Health       Vitals:   03/11/23 2351 03/12/23 0238 03/12/23 0815 03/12/23 1554  BP: 127/65  (!) 156/74 128/72  Pulse: 76  70 66  Resp: 16  16 16   Temp: 97.9 F (36.6 C)  (!) 97.4 F (36.3 C) 98 F (36.7 C)  TempSrc:      SpO2: 94% 94% 97% 94%  Weight:  Height:         Author: Loyce Dys, MD 03/12/2023 5:04 PM  For on call review [email or web address].

## 2023-03-13 ENCOUNTER — Ambulatory Visit: Payer: Managed Care, Other (non HMO) | Admitting: Student in an Organized Health Care Education/Training Program

## 2023-03-13 ENCOUNTER — Encounter: Payer: Self-pay | Admitting: Certified Registered Nurse Anesthetist

## 2023-03-13 ENCOUNTER — Ambulatory Visit: Payer: Managed Care, Other (non HMO)

## 2023-03-13 ENCOUNTER — Inpatient Hospital Stay: Payer: Managed Care, Other (non HMO)

## 2023-03-13 ENCOUNTER — Encounter
Admission: EM | Disposition: A | Discharge status: Home / Self Care | Payer: Self-pay | Source: Ambulatory Visit | Attending: Internal Medicine

## 2023-03-13 DIAGNOSIS — J90 Pleural effusion, not elsewhere classified: Secondary | ICD-10-CM | POA: Diagnosis not present

## 2023-03-13 DIAGNOSIS — C9000 Multiple myeloma not having achieved remission: Secondary | ICD-10-CM | POA: Diagnosis not present

## 2023-03-13 DIAGNOSIS — B9562 Methicillin resistant Staphylococcus aureus infection as the cause of diseases classified elsewhere: Secondary | ICD-10-CM | POA: Diagnosis not present

## 2023-03-13 DIAGNOSIS — J9601 Acute respiratory failure with hypoxia: Secondary | ICD-10-CM | POA: Diagnosis not present

## 2023-03-13 HISTORY — PX: FLEXIBLE BRONCHOSCOPY: SHX5094

## 2023-03-13 LAB — CBC WITH DIFFERENTIAL/PLATELET
Abs Immature Granulocytes: 0.05 10*3/uL (ref 0.00–0.07)
Basophils Absolute: 0 10*3/uL (ref 0.0–0.1)
Basophils Relative: 1 %
Eosinophils Absolute: 0 10*3/uL (ref 0.0–0.5)
Eosinophils Relative: 1 %
HCT: 22.4 % — ABNORMAL LOW (ref 36.0–46.0)
Hemoglobin: 7.4 g/dL — ABNORMAL LOW (ref 12.0–15.0)
Immature Granulocytes: 1 %
Lymphocytes Relative: 29 %
Lymphs Abs: 1.8 10*3/uL (ref 0.7–4.0)
MCH: 30.6 pg (ref 26.0–34.0)
MCHC: 33 g/dL (ref 30.0–36.0)
MCV: 92.6 fL (ref 80.0–100.0)
Monocytes Absolute: 0.5 10*3/uL (ref 0.1–1.0)
Monocytes Relative: 9 %
Neutro Abs: 3.9 10*3/uL (ref 1.7–7.7)
Neutrophils Relative %: 59 %
Platelets: 267 10*3/uL (ref 150–400)
RBC: 2.42 MIL/uL — ABNORMAL LOW (ref 3.87–5.11)
RDW: 15.1 % (ref 11.5–15.5)
WBC: 6.4 10*3/uL (ref 4.0–10.5)
nRBC: 0 % (ref 0.0–0.2)

## 2023-03-13 LAB — CULTURE, RESPIRATORY W GRAM STAIN

## 2023-03-13 LAB — BASIC METABOLIC PANEL
Anion gap: 8 (ref 5–15)
BUN: 15 mg/dL (ref 6–20)
CO2: 29 mmol/L (ref 22–32)
Calcium: 8.9 mg/dL (ref 8.9–10.3)
Chloride: 102 mmol/L (ref 98–111)
Creatinine, Ser: 1.7 mg/dL — ABNORMAL HIGH (ref 0.44–1.00)
GFR, Estimated: 34 mL/min — ABNORMAL LOW (ref >60)
Glucose, Bld: 88 mg/dL (ref 70–99)
Potassium: 3.9 mmol/L (ref 3.5–5.1)
Sodium: 139 mmol/L (ref 135–145)

## 2023-03-13 LAB — FUNGITELL BETA-D-GLUCAN
Fungitell Value:: 93.594 pg/mL
Result Name:: POSITIVE — AB

## 2023-03-13 LAB — ASPERGILLUS ANTIGEN, BAL/SERUM: Aspergillus Ag, BAL/Serum: 0.04 Index (ref 0.00–0.49)

## 2023-03-13 LAB — CYTOLOGY - NON PAP

## 2023-03-13 LAB — GLUCOSE, CAPILLARY: Glucose-Capillary: 165 mg/dL — ABNORMAL HIGH (ref 70–99)

## 2023-03-13 SURGERY — FLEXIBLE BRONCHOSCOPY
Anesthesia: Moderate Sedation | Laterality: Bilateral

## 2023-03-13 MED ORDER — NALOXONE HCL 2 MG/2ML IJ SOSY
PREFILLED_SYRINGE | INTRAMUSCULAR | Status: AC
  Filled 2023-03-13: qty 2

## 2023-03-13 MED ORDER — FENTANYL CITRATE PF 50 MCG/ML IJ SOSY
25.0000 ug | PREFILLED_SYRINGE | INTRAMUSCULAR | Status: AC | PRN
  Administered 2023-03-13 (×2): 25 ug via INTRAVENOUS

## 2023-03-13 MED ORDER — MIDAZOLAM HCL 2 MG/2ML IJ SOLN
INTRAMUSCULAR | Status: DC | PRN
  Administered 2023-03-13 (×3): 2 mg via INTRAVENOUS

## 2023-03-13 MED ORDER — FENTANYL CITRATE PF 50 MCG/ML IJ SOSY
PREFILLED_SYRINGE | INTRAMUSCULAR | Status: AC
  Filled 2023-03-13: qty 4

## 2023-03-13 MED ORDER — ONDANSETRON HCL 4 MG/2ML IJ SOLN
4.0000 mg | Freq: Four times a day (QID) | INTRAMUSCULAR | Status: DC | PRN
  Administered 2023-03-13 – 2023-03-15 (×2): 4 mg via INTRAVENOUS
  Filled 2023-03-13 (×2): qty 2

## 2023-03-13 MED ORDER — FLUMAZENIL 0.5 MG/5ML IV SOLN
INTRAVENOUS | Status: AC
  Filled 2023-03-13: qty 5

## 2023-03-13 MED ORDER — MIDAZOLAM HCL 2 MG/2ML IJ SOLN
INTRAMUSCULAR | Status: AC
  Filled 2023-03-13: qty 10

## 2023-03-13 NOTE — Progress Notes (Signed)
Progress Note   Patient: Paula Garrett [id number]:[id number] DOB: 1965-12-16 DOA: 03/06/2023     7 DOS: the patient was seen and examined on 03/13/2023    Subjective:  Patient seen and examined at bedside this morning.   Patient is status post bronchoscopy today by Dr. Sharlyn Bologna lavage samples sent results pending Chest tube removed today Denies worsening shortness of breath abdominal pain nausea or vomiting   Brief hospital course: HPI on admission 03/06/23:  "Paula Garrett is a 57 year old female with history of current tobacco use, recently diagnosed multiple myeloma status post cycle 1 of cyclophosphamide Velcade dexamethasone chemotherapy, history of COPD, who presents emergency department from outpatient cancer center for chief concerns of shortness of breath."   Vitals in the ED showed temperature of 98.3, respiration rate 24, heart rate of 73, blood pressure 149/133, SpO2 96% on 6 L nasal cannula.  Chest x-ray showed a large left pleural effusion over 50% of left hemithorax.   See H&P for full HPI on admission and detailed ER course.  Patient was in respiratory distress requiring BiPAP on admission.   Admitted to stepdown unit on BiPAP. Pulmonology and IR consulted for thoracentesis vs potential chest tube placement.   Further hospital course and management as outlined below.     Assessment and Plan: * Recurrent left pleural effusion Exudative pleural effusion Pulmonology performed bedside U/S-guided thoracentesis (9/20), 1600 cc fluid removed --Pulmonology and ID are following --Left chest tube placed 9/22 and removed on 03/13/2023 --IV lasix 20 mg x 2 (9/21, 9/22) - hold diuresis, Cr rising. --Follow pleural fluid cultures and cytology.  --Echo preserved EF, grade I diastolic dysfunction --Follow up consultant recommendations --ID changed Vanc >> Linezolid 9/23 -- Continue on Linezolid and Zosyn Patient is status post bronchoscopy today by Dr.  Sharlyn Bologna lavage samples sent results pending Chest tube removed today   COPD with acute exacerbation (HCC) Seems more likely due to left pleural effusion.  Pt not wheezing on exam. Given Solu-medrol in ED. Defer further steroids for now. Pulmonology following. Continue Duonebs QID Dulera & Incruse   Acute on chronic respiratory failure with hypoxemia (HCC) Pt recently discharged on home oxygen. Presented in respiratory distress requiring BiPAP. Currently on 3 L intranasal oxygen Secondary to recurrent left sided pleural effusion complicated by patient with severe COPD. 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   --Resumed on home regimen - gabapentin, fentanyl patch (last placed 1700 on 9/18 at home, confirmed by family), PO dilaudid PRN -- Continue IV dilaudid for breakthrough pain -- requiring for chest tube pain at this time   AKI (acute kidney injury) (HCC) Baseline renal function unclear, has been recovering from severe AKI related to multiple myeloma, steadily improving since starting treatment.  Mild AKI currently due to diuresis most likely. 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   -Continue to monitor CBC closely   Anxiety Continue on home Lexapro, Klonopin BID   OSA (obstructive sleep apnea) Not on CPAP   Tobacco use disorder Nicotine patch ordered   Hypothyroidism Continue Synthroid   Benign essential hypertension Continue home amlodipine PRN hydralazine   Barrett's esophagus without dysplasia Continue Protonix   Hypokalemia Mild,  due to diuresis. K was replaced. Monitor & replace K as needed    Hypomagnesemia Mg 1.6 on 9/24 was replaced Monitor & replace Mg as needed.   SOB (shortness of breath) Mostly due to large left pleural effusion. Initially required BiPAP. Improved after thoracentesis.   Status post bariatric surgery No acute issues. Monitor   Postoperative hypothyroidism .   Obesity (BMI 30-39.9) Body mass index is 37.38 kg/m. Complicates overall care and prognosis.  Recommend lifestyle modifications including physical activity and diet for weight loss and overall long-term health.       Physical Exam: General exam: awake, appears drowsy, no acute distress, mildly ill appearing HEENT: moist mucus membranes, hearing grossly normal  Respiratory system: Chest tubes in place in the left lung in place with serosanguinous fluid in chamber, diminished bases L>R, normal respiratory effort at rest, on 4 L/min [place] o2. Cardiovascular system: normal S1/S2, RRR, trace LE edema.   Gastrointestinal system: soft, NT, ND Central nervous system: A&O x 3. no gross focal neurologic deficits, normal speech Extremities: moves all, no edema, normal tone Skin: dry, intact, normal temperature Psychiatry: normal mood, congruent affect, judgement and insight appear normal     Data Reviewed: I have reviewed patient's labs as shown below Pending - pleural fluid cultures & cytology, aspergillus Ag, fungitell     Family Communication: none present on rounds today. Pt able to update.     Disposition: Status is: Inpatient Remains inpatient appropriate because: severity of illness, chest tube in place, cultures pending, ongoing O2 needs above baseline    Planned Discharge Destination: Home and Home with Home Health      Latest Ref Rng & Units 03/13/2023    3:33 AM 03/12/2023    5:07 AM 03/11/2023    5:23 AM  CBC  WBC 4.0 - 10.5 K/uL 6.4  8.6  10.5   Hemoglobin 12.0 - 15.0 g/dL 7.4  7.7  8.1   Hematocrit 36.0 - 46.0 % 22.4  24.0  25.5   Platelets 150 - 400 K/uL 267  328  391       Vitals:   03/13/23 0938 03/13/23 0940 03/13/23 0950 03/13/23 1007  BP: (!) 150/69 (!) 144/67 135/65 (!) 143/86  Pulse: 89 87 87 84  Resp:    17  Temp:    98.1 F (36.7 C)  TempSrc:      SpO2: 95% 90% 94% 90%  Weight:      Height:        Author: Loyce Dys, MD 03/13/2023 2:51 PM  For on call review [email or web address].

## 2023-03-13 NOTE — Consult Note (Signed)
Pharmacy Antibiotic Note  Paula Garrett is a 57 y.o. female admitted on 03/06/2023 with recurrent left pleural effusion.  Pharmacy has been consulted for Zosyn dosing for pneumonia.  Today, 03/13/2023 Day 4 of Zosyn/linezolid  WBC 6.4  Afebrile  Scr 1.7 today with estimated CrCl 37.7 mL/min  Plan: Continue Zosyn 3.375g IV Q8H Patient also on linezolid 600 mg IV Q12H  Monitor platelets while on linezolid F/u sputum culture results and cytology from BAL  Pharmacy will continue to monitor and dose adjust appropriately   Height: 5\' 2"  (157.5 cm) Weight: 92.7 kg (204 lb 5.9 oz) IBW/kg (Calculated) : 50.1  Temp (24hrs), Avg:98 F (36.7 C), Min:97.6 F (36.4 C), Max:98.1 F (36.7 C)  Recent Labs  Lab 03/09/23 0327 03/10/23 0537 03/11/23 0523 03/12/23 0507 03/13/23 0333  WBC 9.5 10.6* 10.5 8.6 6.4  CREATININE 1.46* 1.76* 1.84* 1.74* 1.70*    Estimated Creatinine Clearance: 37.7 mL/min (A) (by C-G formula based on SCr of 1.7 mg/dL (H)).    Allergies  Allergen Reactions   Hctz [Hydrochlorothiazide]     Hyponatremia    Antimicrobials this admission: Zosyn 9/22 >>  Vancomycin 9/23 x 1 Linezolid 9/23 >>  Dose adjustments this admission: N/A  Microbiology results: 9/20 Pleural fluid: NG final  9/21 MRSA PCR: detected 9/25 Sputum culture: pending   Thank you for allowing pharmacy to be a part of this patient's care.  Littie Deeds, PharmD  PGY1 Pharmacy Resident 03/13/2023 11:22 AM

## 2023-03-13 NOTE — TOC Progression Note (Signed)
Transition of Care [facility]) - Progression Note    Patient Details  Name: Paula Garrett MRN: [id number] Date of Birth: 1966-02-10  [facility] of [facility]) CM/SW Contact  Marlowe Sax, RN Phone Number: 03/13/2023, 11:17 AM  Clinical Narrative:    TOC continues to follow for needs, currently on 3 liters O2, Bronchoscopy completed today   Expected Discharge Plan: Home/Self Care Barriers to Discharge: Continued Medical Work up  Expected Discharge Plan and Services   Discharge Planning Services: CM Consult Post Acute Care Choice: Durable Medical Equipment, Home Health Living arrangements for the past 2 months: Apartment                           HH Arranged: NA           Social Determinants of Health (SDOH) Interventions SDOH Screenings   Food Insecurity: No Food Insecurity (03/06/2023)  Housing: Low Risk  (03/06/2023)  Transportation Needs: No Transportation Needs (03/06/2023)  Utilities: Not At Risk (03/06/2023)  Alcohol Screen: Low Risk  (01/01/2023)  Depression (PHQ2-9): High Risk (03/04/2023)  Financial Resource Strain: Low Risk  (11/01/2022)  Physical Activity: Insufficiently Active (11/01/2022)  Social Connections: Socially Isolated (03/04/2023)  Stress: Stress Concern Present (03/04/2023)  Tobacco Use: High Risk (03/06/2023)  Health Literacy: Adequate Health Literacy (03/04/2023)    Readmission Risk Interventions    02/24/2023   11:28 AM 02/12/2023    3:19 PM  Readmission Risk Prevention Plan  Transportation Screening Complete Complete  Medication Review (RN Care Manager) Referral to Pharmacy Complete  PCP or Specialist appointment within 3-5 days of discharge Complete   HRI or Home Care Consult Complete Complete  SW Recovery Care/Counseling Consult Complete Complete  Palliative Care Screening Not Applicable Complete  Skilled Nursing Facility Not Applicable Not Applicable

## 2023-03-13 NOTE — Plan of Care (Signed)

## 2023-03-13 NOTE — Plan of Care (Signed)
  Problem: Safety: Goal: Ability to remain free from injury will improve Outcome: Progressing   Problem: Pain Managment: Goal: General experience of comfort will improve Outcome: Progressing   Problem: Elimination: Goal: Will not experience complications related to bowel motility Outcome: Progressing   Problem: Activity: Goal: Risk for activity intolerance will decrease Outcome: Progressing   Problem: Safety: Goal: Ability to remain free from injury will improve Outcome: Progressing   Problem: Skin Integrity: Goal: Risk for impaired skin integrity will decrease Outcome: Progressing

## 2023-03-13 NOTE — Plan of Care (Signed)
Problem: Education: Goal: Ability to verbalize activity precautions or restrictions will improve 03/13/2023 2352 by Milda Smart D, LPN Outcome: Progressing 03/13/2023 2352 by Milda Smart D, LPN Outcome: Progressing Goal: Knowledge of the prescribed therapeutic regimen will improve 03/13/2023 2352 by Milda Smart D, LPN Outcome: Progressing 03/13/2023 2352 by Milda Smart D, LPN Outcome: Progressing Goal: Understanding of discharge needs will improve 03/13/2023 2352 by Clydene Laming, LPN Outcome: Progressing 03/13/2023 2352 by Milda Smart D, LPN Outcome: Progressing   Problem: Activity: Goal: Ability to avoid complications of mobility impairment will improve 03/13/2023 2352 by Milda Smart D, LPN Outcome: Progressing 03/13/2023 2352 by Milda Smart D, LPN Outcome: Progressing Goal: Ability to tolerate increased activity will improve 03/13/2023 2352 by Milda Smart D, LPN Outcome: Progressing 03/13/2023 2352 by Milda Smart D, LPN Outcome: Progressing Goal: Will remain free from falls 03/13/2023 2352 by Milda Smart D, LPN Outcome: Progressing 03/13/2023 2352 by Milda Smart D, LPN Outcome: Progressing   Problem: Bowel/Gastric: Goal: Gastrointestinal status for postoperative course will improve 03/13/2023 2352 by Milda Smart D, LPN Outcome: Progressing 03/13/2023 2352 by Milda Smart D, LPN Outcome: Progressing   Problem: Clinical Measurements: Goal: Ability to maintain clinical measurements within normal limits will improve 03/13/2023 2352 by Milda Smart D, LPN Outcome: Progressing 03/13/2023 2352 by Milda Smart D, LPN Outcome: Progressing Goal: Postoperative complications will be avoided or minimized 03/13/2023 2352 by Milda Smart D, LPN Outcome: Progressing 03/13/2023 2352 by Milda Smart D, LPN Outcome: Progressing Goal: Diagnostic test results will improve 03/13/2023 2352 by Milda Smart D, LPN Outcome: Progressing 03/13/2023 2352  by Milda Smart D, LPN Outcome: Progressing   Problem: Pain Management: Goal: Pain level will decrease 03/13/2023 2352 by Milda Smart D, LPN Outcome: Progressing 03/13/2023 2352 by Milda Smart D, LPN Outcome: Progressing   Problem: Skin Integrity: Goal: Will show signs of wound healing 03/13/2023 2352 by Milda Smart D, LPN Outcome: Progressing 03/13/2023 2352 by Milda Smart D, LPN Outcome: Progressing   Problem: Health Behavior/Discharge Planning: Goal: Identification of resources available to assist in meeting health care needs will improve 03/13/2023 2352 by Milda Smart D, LPN Outcome: Progressing 03/13/2023 2352 by Milda Smart D, LPN Outcome: Progressing   Problem: Bladder/Genitourinary: Goal: Urinary functional status for postoperative course will improve 03/13/2023 2352 by Milda Smart D, LPN Outcome: Progressing 03/13/2023 2352 by Milda Smart D, LPN Outcome: Progressing   Problem: Education: Goal: Knowledge of General Education information will improve Description: Including pain rating scale, medication(s)/side effects and non-pharmacologic comfort measures 03/13/2023 2352 by Milda Smart D, LPN Outcome: Progressing 03/13/2023 2352 by Milda Smart D, LPN Outcome: Progressing   Problem: Health Behavior/Discharge Planning: Goal: Ability to manage health-related needs will improve 03/13/2023 2352 by Milda Smart D, LPN Outcome: Progressing 03/13/2023 2352 by Milda Smart D, LPN Outcome: Progressing   Problem: Clinical Measurements: Goal: Ability to maintain clinical measurements within normal limits will improve 03/13/2023 2352 by Milda Smart D, LPN Outcome: Progressing 03/13/2023 2352 by Milda Smart D, LPN Outcome: Progressing Goal: Will remain free from infection 03/13/2023 2352 by Milda Smart D, LPN Outcome: Progressing 03/13/2023 2352 by Milda Smart D, LPN Outcome: Progressing Goal: Diagnostic test results will  improve 03/13/2023 2352 by Milda Smart D, LPN Outcome: Progressing 03/13/2023 2352 by Milda Smart D, LPN Outcome: Progressing Goal: Respiratory complications will improve 03/13/2023 2352 by Milda Smart D, LPN Outcome: Progressing 03/13/2023 2352 by Milda Smart D, LPN Outcome: Progressing Goal: Cardiovascular complication will be avoided 03/13/2023 2352 by Clydene Laming, LPN Outcome: Progressing  03/13/2023 2352 by Milda Smart D, LPN Outcome: Progressing   Problem: Activity: Goal: Risk for activity intolerance will decrease 03/13/2023 2352 by Milda Smart D, LPN Outcome: Progressing 03/13/2023 2352 by Milda Smart D, LPN Outcome: Progressing   Problem: Nutrition: Goal: Adequate nutrition will be maintained 03/13/2023 2352 by Milda Smart D, LPN Outcome: Progressing 03/13/2023 2352 by Milda Smart D, LPN Outcome: Progressing   Problem: Coping: Goal: Level of anxiety will decrease 03/13/2023 2352 by Milda Smart D, LPN Outcome: Progressing 03/13/2023 2352 by Milda Smart D, LPN Outcome: Progressing   Problem: Elimination: Goal: Will not experience complications related to bowel motility 03/13/2023 2352 by Milda Smart D, LPN Outcome: Progressing 03/13/2023 2352 by Milda Smart D, LPN Outcome: Progressing Goal: Will not experience complications related to urinary retention 03/13/2023 2352 by Milda Smart D, LPN Outcome: Progressing 03/13/2023 2352 by Milda Smart D, LPN Outcome: Progressing   Problem: Pain Managment: Goal: General experience of comfort will improve 03/13/2023 2352 by Milda Smart D, LPN Outcome: Progressing 03/13/2023 2352 by Milda Smart D, LPN Outcome: Progressing   Problem: Safety: Goal: Ability to remain free from injury will improve 03/13/2023 2352 by Milda Smart D, LPN Outcome: Progressing 03/13/2023 2352 by Milda Smart D, LPN Outcome: Progressing   Problem: Skin Integrity: Goal: Risk for impaired skin  integrity will decrease 03/13/2023 2352 by Milda Smart D, LPN Outcome: Progressing 03/13/2023 2352 by Clydene Laming, LPN Outcome: Progressing

## 2023-03-13 NOTE — TOC Initial Note (Signed)
Transition of Care [facility]) - Initial/Assessment Note    Patient Details  Name: Paula Garrett MRN: [id number] Date of Birth: 07-Aug-1965  Transition of [facility]) CM/SW Contact:    Marlowe Sax, RN Phone Number: 03/13/2023, 1:23 PM  Clinical Narrative:                   Expected Discharge Plan: Home/Self Care Barriers to Discharge: Continued Medical Work up  Clinical Narrative:    The patient was discharged home on home oxygen 9/12, now has chest tube in place on 4 liters of O2   PMH of Multiple Myeloma, HTN, Hypothyroidism, Asthma,    Current smoker   TOC will continue to follow and assist with DC plan and needs     Expected Discharge Plan: Home/Self Care Barriers to Discharge: Continued Medical Work up   Expected Discharge Plan and Services Discharge Planning Services: CM Consult Post Acute Care Choice: Durable Medical Equipment, Home Health Living arrangements for the past 2 months: Apartment                       HH Arranged: NA     Social Determinants of Health (SDOH) Interventions SDOH Screenings    Food Insecurity: No Food Insecurity (03/06/2023)  Housing: Low Risk  (03/06/2023)  Transportation Needs: No Transportation Needs (03/06/2023)  Utilities: Not At Risk (03/06/2023)  Alcohol Screen: Low Risk  (01/01/2023)  Depression (PHQ2-9): High Risk (03/04/2023)  Financial Resource Strain: Low Risk  (11/01/2022)  Physical Activity: Insufficiently Active (11/01/2022)  Social Connections: Socially Isolated (03/04/2023)  Stress: Stress Concern Present (03/04/2023)  Tobacco Use: High Risk (03/06/2023)  Health Literacy: Adequate Health Literacy (03/04/2023)    Patient Goals and CMS Choice   CMS Medicare.gov Compare Post Acute Care list provided to:: Patient        Expected Discharge Plan and Services   Discharge Planning Services: CM Consult Post Acute Care Choice: Durable Medical Equipment, Home Health Living arrangements for the past 2 months: Apartment                            HH Arranged: NA          Prior Living Arrangements/Services Living arrangements for the past 2 months: Apartment Lives with:: Self (has brother, sister and son for support and help) Patient language and need for interpreter reviewed:: Yes Do you feel safe going back to the place where you live?: Yes      Need for Family Participation in Patient Care: Yes (Comment) Care giver support system in place?: Yes (comment) Current home services: DME (home oxygen, rolling walker) Criminal Activity/Legal Involvement Pertinent to Current Situation/Hospitalization: No - Comment as needed  Activities of Daily Living Home Assistive Devices/Equipment: Oxygen ADL Screening (condition at time of admission) Does the patient have a NEW difficulty with bathing/dressing/toileting/self-feeding that is expected to last >3 days?: No Does the patient have a NEW difficulty with getting in/out of bed, walking, or climbing stairs that is expected to last >3 days?: No Does the patient have a NEW difficulty with communication that is expected to last >3 days?: No Is the patient deaf or have difficulty hearing?: No Does the patient have difficulty seeing, even when wearing glasses/contacts?: No Does the patient have difficulty concentrating, remembering, or making decisions?: No  Permission Sought/Granted   Permission granted to share information with : Yes, Verbal Permission Granted  Emotional Assessment Appearance:: Appears stated age Attitude/Demeanor/Rapport: Engaged Affect (typically observed): Accepting Orientation: : Oriented to Self, Oriented to Place, Oriented to  Time, Oriented to Situation Alcohol / Substance Use: Not Applicable Psych Involvement: No (comment)  Admission diagnosis:  Acute exacerbation of chronic obstructive pulmonary disease (COPD) (HCC) [J44.1] Recurrent left pleural effusion [J90] Acute respiratory failure with hypoxemia (HCC)  [J96.01] Patient Active Problem List   Diagnosis Date Noted   Acute respiratory failure with hypoxemia (HCC) 03/12/2023   Hypokalemia 03/11/2023   Hypomagnesemia 03/10/2023   Recurrent left pleural effusion 03/06/2023   Acute metabolic encephalopathy 02/25/2023   COPD with acute exacerbation (HCC) 02/25/2023   Chronic pain syndrome 02/25/2023   Leukopenia 02/25/2023   Acute on chronic respiratory failure with hypoxemia (HCC) 02/23/2023   Shortness of breath 02/17/2023   Confusion 02/03/2023   Fluid overload 01/30/2023   Hyponatremia 01/29/2023   AKI (acute kidney injury) (HCC) 01/29/2023   Acute blood loss anemia 01/29/2023   Spinal instability of 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/28/2023   Drug induced constipation 01/27/2023   Palliative care encounter 01/27/2023   Multiple myeloma (HCC) 01/25/2023   Spinal stenosis, 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   MDD (major depressive disorder), recurrent episode, moderate (HCC) 11/20/2022   SOB (shortness of breath) 11/01/2022   Chest pain 10/07/2022   Anxiety 10/07/2022   Iron deficiency anemia 12/14/2021   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   Goals of care, counseling/discussion 07/24/2020   Hypercalcemia 05/05/2020   Traumatic seroma of lower leg, right, subsequent encounter 05/05/2020   Traumatic hematoma of lower leg, right, subsequent encounter 02/09/2020   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   Colon polyps 07/26/2019   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   Prediabetes 09/10/2018   OSA (obstructive sleep apnea) 01/09/2018   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   Allergic rhinitis 11/20/2017   Hot flashes 11/20/2017   Asthma 07/26/2017   B12 deficiency 07/26/2017   Snoring 05/01/2016   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   Barrett's esophagus without dysplasia  05/27/2014   History of adenomatous polyp of colon 05/27/2014   Anemia due to multiple mechanisms 01/28/2014   Obesity (BMI 30-39.9) 01/28/2014   Low back pain 07/21/2013   Benign essential hypertension 01/20/2013   Vitamin D deficiency 12/02/2012   Chronic left shoulder pain 12/02/2012   Tobacco use disorder 06/09/2012   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   Insomnia 04/21/2012   Hypothyroidism 04/21/2012   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   PCP:  Smitty Cords, DO Pharmacy:   [facility] [phone] [place], [place] - [address] AT [facility] [address] [place] [address] [place] [address] Phone: [phone] Fax: [phone]     Social Determinants of Health (SDOH) Social History: SDOH Screenings   Food Insecurity: No Food Insecurity (03/06/2023)  Housing: Low Risk  (03/06/2023)  Transportation Needs: No Transportation Needs (03/06/2023)  Utilities: Not At Risk (03/06/2023)  Alcohol Screen: Low Risk  (01/01/2023)  Depression (PHQ2-9): High Risk (03/04/2023)  Financial Resource Strain: Low Risk  (11/01/2022)  Physical Activity: Insufficiently Active (11/01/2022)  Social Connections: Socially Isolated (03/04/2023)  Stress: Stress Concern Present (03/04/2023)  Tobacco Use: High Risk (03/06/2023)  Health Literacy: Adequate Health Literacy (03/04/2023)   SDOH Interventions:     Readmission Risk Interventions    02/24/2023   11:28 AM  02/12/2023    3:19 PM  Readmission Risk Prevention Plan  Transportation Screening Complete Complete  Medication Review (RN Care Manager) Referral to Pharmacy Complete  PCP or Specialist appointment within 3-5 days of discharge Complete   HRI or Home Care Consult Complete Complete  SW Recovery Care/Counseling Consult Complete Complete  Palliative Care Screening Not Applicable Complete  Skilled Nursing Facility Not Applicable Not Applicable

## 2023-03-13 NOTE — Progress Notes (Signed)
Date of Admission:  03/06/2023      ID: Paula Garrett is a 57 y.o. 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 female with a history of gastric bypass, Multiple myeloma, hypothyroidism.had chest pain and sob in Aug 2024 and found to have a large soft tissue mass Left T6-T7 paraspinal area with destruction 7th rib and invasion of T6-T7 vertebral bodiesT underwent T4-T9 fusion and T6-T7 laminotomy ( pathology plasma cell neoplasm). anemia, OSA, hypothyroidism, COPD, last admission to [facility] 02/23/23-02/27/23 for hypoxic, hypercapneic resp failure, pneunmonia, left pleural effusion and underwent thoracentesis and culture neg, sent on augmentin and home oxygen presented to the Onc clinic on 9/19 with SOB abd sats in 70s and was sent ot the ED   Subjective: Sitting in bed and talking to family Had bronchoscopy today  Cough present Medications:   amLODipine  10 mg Oral Daily   Chlorhexidine Gluconate Cloth  6 each Topical Q0600   clonazePAM  1 mg Oral BID   docusate sodium  100 mg Oral BID   escitalopram  10 mg Oral Daily   fentaNYL  1 patch Transdermal Q72H   gabapentin  100 mg Oral TID   ipratropium-albuterol  3 mL Nebulization Q6H   levothyroxine  250 mcg Oral QAC breakfast   mometasone-formoterol  2 puff Inhalation BID   mupirocin ointment  1 Application Nasal BID   pantoprazole   40 mg Oral BID AC   polyethylene glycol  17 g Oral Daily   senna-docusate  1 tablet Oral BID   sodium bicarbonate  650 mg Oral TID   thiamine  100 mg Oral Daily   umeclidinium bromide  1 puff Inhalation Daily    Objective: Vital signs in last 24 hours: Patient Vitals for the past 24 hrs:  BP Temp Temp src Pulse Resp SpO2  03/13/23 1536 127/71 98.1 F (36.7 C) -- 80 15 94 %  03/13/23 1007 (!) 143/86 98.1 F (36.7 C) -- 84 17 90 %  03/13/23 0950 135/65 -- -- 87 -- 94 %  03/13/23 0940 (!) 144/67 -- -- 87 -- 90 %  03/13/23 0938 (!) 150/69 -- -- 89 -- 95 %  03/13/23 0930 (!) 159/131 -- -- (!) 111 -- (!) 88 %  03/13/23 0927 (!) 146/72 -- -- 73 -- 96 %  03/13/23 0924 (!) 153/83 -- -- 86 -- 97 %  03/13/23 0918 (!) 148/126 -- -- (!) 105 -- 93 %  03/13/23 0915 130/68 -- -- 72 -- 100 %  03/13/23 0912 132/70 -- -- 73 -- 100 %  03/13/23 0909 128/78 -- -- 74 -- 100 %  03/13/23 0906 134/68 -- -- 75 -- 100 %  03/13/23 0903 (!) 145/73 -- -- 71 -- 99 %  03/13/23 0900 (!) 147/70 -- -- 69 -- 100 %  03/13/23 0830 (!) 144/77 97.6 F (  36.4 C) Temporal 62 18 95 %  03/13/23 0803 (!) 142/79 98.1 F (36.7 C) -- 67 18 96 %  03/13/23 0245 -- -- -- -- -- 95 %  03/12/23 2224 139/67 98 F (36.7 C) -- 78 20 93 %  03/12/23 2055 -- -- -- -- -- 97 %     PHYSICAL EXAM:  General: Alert, cooperative, no distress, appears stated age.  Lungs:/l air entry few rhonchi Chest drain removed Heart: Regular rate and rhythm, no murmur, rub or gallop. Abdomen: Soft, non-tender,not distended. Bowel sounds normal. No masses Extremities: atraumatic, no cyanosis. No edema. No clubbing Skin: No rashes or lesions. Or bruising Lymph: Cervical, supraclavicular normal. Neurologic: Grossly non-focal Surgical scar back- healed  Lab Results    Latest Ref Rng & Units 03/13/2023    3:33 AM 03/12/2023    5:07 AM 03/11/2023    5:23 AM  CBC  WBC 4.0 - 10.5 K/uL 6.4  8.6  10.5   Hemoglobin 12.0 - 15.0 g/dL 7.4  7.7  8.1    Hematocrit 36.0 - 46.0 % 22.4  24.0  25.5   Platelets 150 - 400 K/uL 267  328  391        Latest Ref Rng & Units 03/13/2023    3:33 AM 03/12/2023    5:07 AM 03/11/2023    5:23 AM  CMP  Glucose 70 - 99 mg/dL 88  98  161   BUN 6 - 20 mg/dL 15  18  17    Creatinine 0.44 - 1.00 mg/dL 0.96  0.45  4.09   Sodium 135 - 145 mmol/L 139  139  139   Potassium 3.5 - 5.1 mmol/L 3.9  3.2  3.7   Chloride 98 - 111 mmol/L 102  100  99   CO2 22 - 32 mmol/L 29  30  29    Calcium 8.9 - 10.3 mg/dL 8.9  8.5  8.4       Microbiology:  Studies/Results:   CXR from today- improved aeration of left lung compared to 9/19 Assessment/Plan:  Acute hypoxic respiratory failure secondary to left loculated pleural effusion.  Rule out empyema.  Lymphocytic predominance.  Thoracentesis done on 03/09/2023 showed monocytic lymphocytic predominance.  Culture negative so far.  On Zosyn and linezolid Sputum culture is pending- if BAL and sputum neg then would deescalate antibiotic MRSA nares present left-sided chest drain has been removed  Left-sided consolidation Rule out MRSA pneumonia Patient had bronch today - labs sent Pro-Cal is on the lower side at 0.13  Multiple myeloma had received 2 doses of chemotherapy with Velcade and cyclophosphamide last.  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

## 2023-03-13 NOTE — Procedures (Signed)
PROCEDURE: BRONCHOSCOPY Therapeutic Aspiration of Tracheobronchial Tree  PROCEDURE DATE: 03/13/2023  TIME:  NAME:  Paula Garrett  DOB:December 15, 1965  MRN: [id number] LOC:  ARPO/None    HOSP DAY: @LENGTHOFSTAYDAYS @ CODE STATUS:      Code Status Orders  (From admission, onward)           Start     Ordered   03/06/23 1552  Full code  Continuous       Question:  By:  Answer:  Other   03/06/23 1551           Code Status History     Date Active Date Inactive Code Status Order ID Comments User Context   02/23/2023 2105 02/27/2023 1742 Full Code [id number]  Jimmye Norman, NP ED   02/10/2023 1455 02/21/2023 1705 Full Code [id number]  Emeline General, MD ED   01/24/2023 1405 02/06/2023 1630 Full Code [id number]  Cox, Amy Dorris Carnes, DO ED           Indications/Preliminary Diagnosis: Pneumonia  Consent: (Place X beside choice/s below)  The benefits, risks and possible complications of the procedure were        explained to:  _x__ patient  ___ patient's family  ___ other:___________  who verbalized understanding and gave:  ___ verbal  ___ written  _x__ verbal and written  ___ telephone  ___ other:________ consent.      Unable to obtain consent; procedure performed on emergent basis.     Other:       PRESEDATION ASSESSMENT: History and Physical has been performed. Patient meds and allergies have been reviewed. Presedation airway examination has been performed and documented. Baseline vital signs, sedation score, oxygenation status, and cardiac rhythm were reviewed. Patient was deemed to be in satisfactory condition to undergo the procedure.    PREMEDICATIONS:   Sedative/Narcotic Amt Dose   Versed 2 mg   Fentanyl 25 mcg  Diprivan  mg            PROCEDURE DETAILS: Timeout performed and correct patient, name, & ID confirmed. Following prep per Pulmonary policy, appropriate sedation was administered. The Bronchoscope was inserted in to oral cavity with bite block in place.  Therapeutic aspiration of Tracheobronchial tree was performed.  Airway exam proceeded with findings, technical procedures, and specimen collection as noted below. At the end of exam the scope was withdrawn without incident. Impression and Plan as noted below.           Airway Prep (Place X beside choice below)  x 1% Transtracheal Lidocaine Anesthetization 7 cc   Patient prepped per Bronchoscopy Lab Policy       Insertion Route (Place X beside choice below)   Nasal  x Oral   Endotracheal Tube   Tracheostomy   INTRAPROCEDURE MEDICATIONS:  Sedative/Narcotic Amt Dose   Versed 4 mg   Fentanyl 25 mcg  Diprivan  mg       Medication Amt Dose  Medication Amt Dose  Lidocaine 1% 10 cc  Epinephrine 1:10,000 sol  cc  Xylocaine 4%  cc  Cocaine  cc   TECHNICAL PROCEDURES: (Place X beside choice below)   Procedures  Description    None     Electrocautery     Cryotherapy     Balloon Dilatation     Bronchography     Stent Placement   x  Therapeutic Aspiration     Laser/Argon Plasma    Brachytherapy Catheter Placement    Foreign Body Removal  SPECIMENS (Sites): (Place X beside choice below)  Specimens Description   No Specimens Obtained     Washings   x Lavage RT lung lavage instilled 20 cc's, extensive amounts of mucoid secretions   Biopsies    Fine Needle Aspirates    Brushings    Sputum    FINDINGS: extensive amounts of mucoid secretions  ESTIMATED BLOOD LOSS: none COMPLICATIONS/RESOLUTION: none      IMPRESSION:POST-PROCEDURE DX:  Pneumonia with extensive mucoid secretions   RECOMMENDATION/PLAN:   Follow up BAL CX Follow up Fungal CX PCP, Cytology pending   Lucie Leather, M.D.  [facility]  Medical Director [facility] Medical Director [facility] Cardio-Pulmonary Department

## 2023-03-13 NOTE — Progress Notes (Signed)
Pt snoring and appeared in deep sleep when this nurse arrived in the room to administer antibiotic. Pt awake when this nurse announced and scanned armband for medication verification. Pt then asked for PRN pain medication but shortly fell back asleep. Chest rising and falling at this time. Will continue with plan of care at this time.

## 2023-03-14 ENCOUNTER — Encounter: Payer: Self-pay | Admitting: Oncology

## 2023-03-14 ENCOUNTER — Ambulatory Visit: Payer: Managed Care, Other (non HMO)

## 2023-03-14 ENCOUNTER — Telehealth (HOSPITAL_COMMUNITY): Payer: Self-pay | Admitting: Pharmacy Technician

## 2023-03-14 ENCOUNTER — Other Ambulatory Visit (HOSPITAL_COMMUNITY): Payer: Self-pay

## 2023-03-14 ENCOUNTER — Encounter: Payer: Self-pay | Admitting: Internal Medicine

## 2023-03-14 DIAGNOSIS — C9000 Multiple myeloma not having achieved remission: Secondary | ICD-10-CM | POA: Diagnosis not present

## 2023-03-14 DIAGNOSIS — J9601 Acute respiratory failure with hypoxia: Secondary | ICD-10-CM | POA: Diagnosis not present

## 2023-03-14 DIAGNOSIS — B9562 Methicillin resistant Staphylococcus aureus infection as the cause of diseases classified elsewhere: Secondary | ICD-10-CM | POA: Diagnosis not present

## 2023-03-14 DIAGNOSIS — J90 Pleural effusion, not elsewhere classified: Secondary | ICD-10-CM | POA: Diagnosis not present

## 2023-03-14 LAB — BASIC METABOLIC PANEL
Anion gap: 9 (ref 5–15)
BUN: 13 mg/dL (ref 6–20)
CO2: 30 mmol/L (ref 22–32)
Calcium: 8.9 mg/dL (ref 8.9–10.3)
Chloride: 100 mmol/L (ref 98–111)
Creatinine, Ser: 1.72 mg/dL — ABNORMAL HIGH (ref 0.44–1.00)
GFR, Estimated: 34 mL/min — ABNORMAL LOW (ref >60)
Glucose, Bld: 71 mg/dL (ref 70–99)
Potassium: 3.5 mmol/L (ref 3.5–5.1)
Sodium: 139 mmol/L (ref 135–145)

## 2023-03-14 LAB — CBC WITH DIFFERENTIAL/PLATELET
Abs Immature Granulocytes: 0.04 10*3/uL (ref 0.00–0.07)
Basophils Absolute: 0.1 10*3/uL (ref 0.0–0.1)
Basophils Relative: 1 %
Eosinophils Absolute: 0 10*3/uL (ref 0.0–0.5)
Eosinophils Relative: 0 %
HCT: 23.7 % — ABNORMAL LOW (ref 36.0–46.0)
Hemoglobin: 7.5 g/dL — ABNORMAL LOW (ref 12.0–15.0)
Immature Granulocytes: 1 %
Lymphocytes Relative: 30 %
Lymphs Abs: 2.1 10*3/uL (ref 0.7–4.0)
MCH: 30 pg (ref 26.0–34.0)
MCHC: 31.6 g/dL (ref 30.0–36.0)
MCV: 94.8 fL (ref 80.0–100.0)
Monocytes Absolute: 0.7 10*3/uL (ref 0.1–1.0)
Monocytes Relative: 10 %
Neutro Abs: 4 10*3/uL (ref 1.7–7.7)
Neutrophils Relative %: 58 %
Platelets: 258 10*3/uL (ref 150–400)
RBC: 2.5 MIL/uL — ABNORMAL LOW (ref 3.87–5.11)
RDW: 15.1 % (ref 11.5–15.5)
WBC: 6.8 10*3/uL (ref 4.0–10.5)
nRBC: 0 % (ref 0.0–0.2)

## 2023-03-14 LAB — PROCALCITONIN: Procalcitonin: <0.1 ng/mL

## 2023-03-14 MED ORDER — LINEZOLID 600 MG PO TABS
600.0000 mg | ORAL_TABLET | Freq: Two times a day (BID) | ORAL | Status: DC
  Administered 2023-03-14 – 2023-03-17 (×6): 600 mg via ORAL
  Filled 2023-03-14 (×6): qty 1

## 2023-03-14 NOTE — Progress Notes (Signed)
Date of Admission:  03/06/2023      ID: BERTHA LOKKEN is a 57 y.o. 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 female with a history of gastric bypass, Multiple myeloma, hypothyroidism.had chest pain and sob in Aug 2024 and found to have a large soft tissue mass Left T6-T7 paraspinal area with destruction 7th rib and invasion of T6-T7 vertebral bodiesT underwent T4-T9 fusion and T6-T7 laminotomy ( pathology plasma cell neoplasm). anemia, OSA, hypothyroidism, COPD, last admission to [facility] 02/23/23-02/27/23 for hypoxic, hypercapneic resp failure, pneunmonia, left pleural effusion and underwent thoracentesis and culture neg, sent on augmentin and home oxygen presented to the Onc clinic on 9/19 with SOB abd sats in 70s and was sent ot the ED   Subjective: Pt had some nausea last evening  Cough present Medications:   amLODipine  10 mg Oral Daily   Chlorhexidine Gluconate Cloth  6 each Topical Q0600   clonazePAM  1 mg Oral BID   docusate sodium  100 mg Oral BID   escitalopram  10 mg Oral Daily   fentaNYL  1 patch Transdermal Q72H   gabapentin  100 mg Oral TID   ipratropium-albuterol  3 mL Nebulization Q6H   levothyroxine  250 mcg Oral QAC breakfast   linezolid  600 mg Oral Q12H   mometasone-formoterol  2 puff Inhalation BID   mupirocin ointment  1 Application Nasal BID    pantoprazole  40 mg Oral BID AC   polyethylene glycol  17 g Oral Daily   senna-docusate  1 tablet Oral BID   sodium bicarbonate  650 mg Oral TID   thiamine  100 mg Oral Daily   umeclidinium bromide  1 puff Inhalation Daily    Objective: Vital signs in last 24 hours: Patient Vitals for the past 24 hrs:  BP Temp Temp src Pulse Resp SpO2  03/14/23 0827 101/69 98.1 F (36.7 C) -- 73 18 90 %  03/14/23 0717 -- -- -- -- -- 94 %  03/13/23 2337 114/65 98.6 F (37 C) Oral 79 20 97 %  03/13/23 2045 -- -- -- -- -- 95 %  03/13/23 1536 127/71 98.1 F (36.7 C) -- 80 15 94 %     PHYSICAL EXAM:  General: Alert, cooperative, no distress, appears stated age. pale Lungs:/l air entry few rhonchi Heart: Regular rate and rhythm, no murmur, rub or gallop. Abdomen: Soft, non-tender,not distended. Bowel sounds normal. No masses Extremities: atraumatic, no cyanosis. No edema. No clubbing Skin: No rashes or lesions. Or bruising Lymph: Cervical, supraclavicular normal. Neurologic: Grossly non-focal Surgical scar back- healed  Lab Results    Latest Ref Rng & Units 03/14/2023    4:27 AM 03/13/2023    3:33 AM 03/12/2023    5:07 AM  CBC  WBC 4.0 - 10.5 K/uL 6.8  6.4  8.6   Hemoglobin 12.0 -  15.0 g/dL 7.5  7.4  7.7   Hematocrit 36.0 - 46.0 % 23.7  22.4  24.0   Platelets 150 - 400 K/uL 258  267  328        Latest Ref Rng & Units 03/14/2023    4:27 AM 03/13/2023    3:33 AM 03/12/2023    5:07 AM  CMP  Glucose 70 - 99 mg/dL 71  88  98   BUN 6 - 20 mg/dL 13  15  18    Creatinine 0.44 - 1.00 mg/dL 4.09  8.11  9.14   Sodium 135 - 145 mmol/L 139  139  139   Potassium 3.5 - 5.1 mmol/L 3.5  3.9  3.2   Chloride 98 - 111 mmol/L 100  102  100   CO2 22 - 32 mmol/L 30  29  30    Calcium 8.9 - 10.3 mg/dL 8.9  8.9  8.5       Microbiology: Sputum culture neg Pleural fluid culutre X 2 negative BAL fluid pending Studies/Results:   CXR from today- improved aeration of left lung compared to  9/19 Assessment/Plan:  Acute hypoxic respiratory failure secondary to left loculated pleural effusion.  Lymphocytic predominance.  Thoracentesis done on 03/09/2023 showed monocytic lymphocytic predominance.  Culture negative  .  On Zosyn and linezolid  Left-sided consolidation  MRSA nares  Sputum culture neg BAL pending- 24 hrs no growth Pro-Cal is < 0.10 Day 5 of linezolid- would DC if BAL culture remains neg tomorrow DC zosyn today    Multiple myeloma had received 2 doses of chemotherapy with Velcade and cyclophosphamide last.  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 Call if needed

## 2023-03-14 NOTE — Telephone Encounter (Signed)
Pharmacy Patient Advocate Encounter  Received notification from [employer] that Prior Authorization for Linezolid 600MG  tablets  has been APPROVED from 03/14/2023 to 04/13/2023. Ran test claim, Copay is $0.00. This test claim was processed through [facility]- copay amounts may vary at other pharmacies due to pharmacy/plan contracts, or as the patient moves through the different stages of their insurance plan.   PA #/Case ID/Reference #: [id number]

## 2023-03-14 NOTE — Plan of Care (Signed)

## 2023-03-14 NOTE — Plan of Care (Signed)
  Problem: Nutrition: Goal: Adequate nutrition will be maintained Outcome: Progressing   Problem: Pain Managment: Goal: General experience of comfort will improve Outcome: Progressing   Problem: Safety: Goal: Ability to remain free from injury will improve Outcome: Progressing   

## 2023-03-14 NOTE — TOC Benefit Eligibility Note (Signed)
Patient Product/process development scientist completed.    The patient is insured through [employer]. Patient has [employer], may use a copay card, and/or apply for patient assistance if available.    Ran test claim for linezolid (Zyvox) 600 mg and Requires Prior Authorization.   This test claim was processed through [facility]- copay amounts may vary at other pharmacies due to pharmacy/plan contracts, or as the patient moves through the different stages of their insurance plan.     Roland Earl, CPHT Pharmacy Technician III Certified Patient Advocate [facility] Pharmacy Patient Advocate Team Direct Number: [phone]  Fax: [phone]

## 2023-03-14 NOTE — Consult Note (Signed)
VIEW COMPARISON:  March 07, 2023 FINDINGS: The cardiomediastinal silhouette is unchanged in contour. Bilateral pleural effusions, LEFT greater than RIGHT. No pneumothorax. Bibasilar homogeneous opacities within more focal bandlike opacity in LEFT mid lung. Status post posterior fixation of the thoracic spine. IMPRESSION: 1. Bilateral pleural effusions, LEFT greater than RIGHT. 2. Bibasilar opacities likely reflect atelectasis. Electronically Signed   By: Meda Klinefelter M.D.   On: 03/09/2023 10:15   ECHOCARDIOGRAM COMPLETE  Result Date: 03/08/2023    ECHOCARDIOGRAM REPORT   Patient Name:   Paula Garrett Date of Exam: 03/08/2023 Medical Rec #:  [id number]      Height:       62.0 in Accession #:    [id number]     Weight:       204.4 lb Date of Birth:  12-31-1965      BSA:          1.929 m Patient Age:    57 years       BP:           121/65 mmHg Patient Gender: F              HR:           63 bpm. Exam Location:  [facility] Procedure: 2D Echo Indications:     Pleural effusion  History:         Patient has prior history of Echocardiogram examinations, most                  recent 01/31/2023.  Sonographer:     Overton Mam RDCS, FASE Referring Phys:  [id number] Alphonsus Sias GRIFFITH Diagnosing Phys: Lennie Odor MD IMPRESSIONS  1. Left ventricular ejection fraction, by estimation, is 60 to 65%. The left ventricle has normal function. The left ventricle has no regional wall motion abnormalities. Left ventricular diastolic parameters were normal.  2. Right ventricular systolic function is normal. The right ventricular size is normal. There is normal pulmonary artery systolic pressure. The estimated right ventricular systolic pressure is 31.8 mmHg.  3. The mitral valve is grossly normal. Trivial mitral valve regurgitation.  4. The aortic valve is tricuspid. Aortic valve regurgitation is not visualized. No aortic stenosis is present.  5. The inferior vena cava is dilated in size with >50% respiratory variability, suggesting right atrial pressure of 8 mmHg. FINDINGS  Left Ventricle: Left ventricular ejection fraction, by estimation, is 60 to 65%. The left ventricle has normal function. The left ventricle has no regional wall motion abnormalities. The left ventricular internal cavity size was normal in size. There is  no left ventricular hypertrophy. Left ventricular diastolic parameters were normal. Right Ventricle: The right ventricular size is normal. No increase in right ventricular wall  thickness. Right ventricular systolic function is normal. There is normal pulmonary artery systolic pressure. The tricuspid regurgitant velocity is 2.44 m/s, and  with an assumed right atrial pressure of 8 mmHg, the estimated right ventricular systolic pressure is 31.8 mmHg. Left Atrium: Left atrial size was normal in size. Right Atrium: Right atrial size was normal in size. Pericardium: There is no evidence of pericardial effusion. Mitral Valve: The mitral valve is grossly normal. Trivial mitral valve regurgitation. Tricuspid Valve: The tricuspid valve is grossly normal. Tricuspid valve regurgitation is trivial. No evidence of tricuspid stenosis. Aortic Valve: The aortic valve is tricuspid. Aortic valve regurgitation is not visualized. No aortic stenosis is present. Aortic valve peak gradient measures 9.2 mmHg. Pulmonic Valve: The pulmonic valve was grossly normal. Pulmonic  Hematology/Oncology Consult note [facility] [phone] [phone]  Patient Care Team: Smitty Cords, DO as PCP - General (Family Medicine) Lemar Livings, Merrily Pew, MD (General Surgery) Shelia Media, MD (Internal Medicine) Creig Hines, MD as Consulting Physician (Oncology)   Name of the patient: Paula Garrett  [id number]  [birth year]    Reason for consult: History of multiple myeloma admitted for acute hypoxic respiratory failure   Requesting physician:  DR. Rosezetta Schlatter  Date of visit: 03/14/2023    History of presenting illness- Patient is a 57 year old female with newly diagnosed IgG lambda multiple myeloma status post 1 cycle of Cytoxan Velcade dexamethasone chemotherapy with last cycle given on 02/18/2023 when patient was admitted to the hospital for acute renal failure.  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  Plan was for her to start the RVD based chemotherapy starting cycle 2 upon discharge but patient has had repeated episodes of admission for acute hypoxic respiratory failure.  During her initial hospitalization for the paraspinal mass she was noted to have increased oxygen requirements prior to start of chemotherapy and was given Lasix and eventually weaned off oxygen and discharged.  When she was readmitted to the hospital for renal failure from myeloma she was noted to have increasing oxygen requirements VQ scan on 02/18/2023 showed no evidence of pulmonary embolism but chest x-ray showed moderate left pleural effusion as well as atelectasis versus pneumonia.  She was discharged on oral antibiotics.  Shortly thereafter patient was readmitted to the hospital for acute hypoxic as well as hypercapnic respiratory failure and was in the ICU briefly requiring diuresis.  She was then transferred to floor and was eventually discharged on Augmentin as she  was neutropenic from prior Cytoxan.Patient had thoracentesis on 02/24/2023 which was negative for malignancy and ISH stains on the pleural fluid did not identify any involvement with patient's myeloma.  Repeat thoracentesis on 03/08/2023 was also negative for malignancy.  Patient was seen in the clinic as an outpatient after her her discharge from hospitalization and was dropping her saturations less than 90% with 2 L and was therefore hospitalized Pulmonary was consulted during this hospitalization.  She had a CT chest without contrast on 03/15/2023 which showed worsening consolidation in the right upper lobe as well as a large loculated left pleural effusion.ID has also been on board and patient had chest tube placement thoracentesis shows lymphocytic predominant fluid but cultures negative.  She is currently on Zosyn and linezolid.  Patient underwent bronchoscopy and samples collected on 03/13/2023.  ECOG PS- 2  Pain scale- 6   Review of systems- Review of Systems  Constitutional:  Positive for malaise/fatigue. Negative for chills, fever and weight loss.  HENT:  Negative for congestion, ear discharge and nosebleeds.   Eyes:  Negative for blurred vision.  Respiratory:  Positive for shortness of breath. Negative for cough, hemoptysis, sputum production and wheezing.   Cardiovascular:  Negative for chest pain, palpitations, orthopnea and claudication.  Gastrointestinal:  Negative for abdominal pain, blood in stool, constipation, diarrhea, heartburn, melena, nausea and vomiting.  Genitourinary:  Negative for dysuria, flank pain, frequency, hematuria and urgency.  Musculoskeletal:  Positive for back pain. Negative for joint pain and myalgias.  Skin:  Negative for rash.  Neurological:  Negative for dizziness, tingling, focal weakness, seizures, weakness and headaches.  Endo/Heme/Allergies:  Does not bruise/bleed easily.  Psychiatric/Behavioral:  Negative for depression and suicidal  Hematology/Oncology Consult note [facility] [phone] [phone]  Patient Care Team: Smitty Cords, DO as PCP - General (Family Medicine) Lemar Livings, Merrily Pew, MD (General Surgery) Shelia Media, MD (Internal Medicine) Creig Hines, MD as Consulting Physician (Oncology)   Name of the patient: Paula Garrett  [id number]  [birth year]    Reason for consult: History of multiple myeloma admitted for acute hypoxic respiratory failure   Requesting physician:  DR. Rosezetta Schlatter  Date of visit: 03/14/2023    History of presenting illness- Patient is a 57 year old female with newly diagnosed IgG lambda multiple myeloma status post 1 cycle of Cytoxan Velcade dexamethasone chemotherapy with last cycle given on 02/18/2023 when patient was admitted to the hospital for acute renal failure.  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  Plan was for her to start the RVD based chemotherapy starting cycle 2 upon discharge but patient has had repeated episodes of admission for acute hypoxic respiratory failure.  During her initial hospitalization for the paraspinal mass she was noted to have increased oxygen requirements prior to start of chemotherapy and was given Lasix and eventually weaned off oxygen and discharged.  When she was readmitted to the hospital for renal failure from myeloma she was noted to have increasing oxygen requirements VQ scan on 02/18/2023 showed no evidence of pulmonary embolism but chest x-ray showed moderate left pleural effusion as well as atelectasis versus pneumonia.  She was discharged on oral antibiotics.  Shortly thereafter patient was readmitted to the hospital for acute hypoxic as well as hypercapnic respiratory failure and was in the ICU briefly requiring diuresis.  She was then transferred to floor and was eventually discharged on Augmentin as she  was neutropenic from prior Cytoxan.Patient had thoracentesis on 02/24/2023 which was negative for malignancy and ISH stains on the pleural fluid did not identify any involvement with patient's myeloma.  Repeat thoracentesis on 03/08/2023 was also negative for malignancy.  Patient was seen in the clinic as an outpatient after her her discharge from hospitalization and was dropping her saturations less than 90% with 2 L and was therefore hospitalized Pulmonary was consulted during this hospitalization.  She had a CT chest without contrast on 03/15/2023 which showed worsening consolidation in the right upper lobe as well as a large loculated left pleural effusion.ID has also been on board and patient had chest tube placement thoracentesis shows lymphocytic predominant fluid but cultures negative.  She is currently on Zosyn and linezolid.  Patient underwent bronchoscopy and samples collected on 03/13/2023.  ECOG PS- 2  Pain scale- 6   Review of systems- Review of Systems  Constitutional:  Positive for malaise/fatigue. Negative for chills, fever and weight loss.  HENT:  Negative for congestion, ear discharge and nosebleeds.   Eyes:  Negative for blurred vision.  Respiratory:  Positive for shortness of breath. Negative for cough, hemoptysis, sputum production and wheezing.   Cardiovascular:  Negative for chest pain, palpitations, orthopnea and claudication.  Gastrointestinal:  Negative for abdominal pain, blood in stool, constipation, diarrhea, heartburn, melena, nausea and vomiting.  Genitourinary:  Negative for dysuria, flank pain, frequency, hematuria and urgency.  Musculoskeletal:  Positive for back pain. Negative for joint pain and myalgias.  Skin:  Negative for rash.  Neurological:  Negative for dizziness, tingling, focal weakness, seizures, weakness and headaches.  Endo/Heme/Allergies:  Does not bruise/bleed easily.  Psychiatric/Behavioral:  Negative for depression and suicidal  VIEW COMPARISON:  March 07, 2023 FINDINGS: The cardiomediastinal silhouette is unchanged in contour. Bilateral pleural effusions, LEFT greater than RIGHT. No pneumothorax. Bibasilar homogeneous opacities within more focal bandlike opacity in LEFT mid lung. Status post posterior fixation of the thoracic spine. IMPRESSION: 1. Bilateral pleural effusions, LEFT greater than RIGHT. 2. Bibasilar opacities likely reflect atelectasis. Electronically Signed   By: Meda Klinefelter M.D.   On: 03/09/2023 10:15   ECHOCARDIOGRAM COMPLETE  Result Date: 03/08/2023    ECHOCARDIOGRAM REPORT   Patient Name:   Paula Garrett Date of Exam: 03/08/2023 Medical Rec #:  [id number]      Height:       62.0 in Accession #:    [id number]     Weight:       204.4 lb Date of Birth:  12-31-1965      BSA:          1.929 m Patient Age:    57 years       BP:           121/65 mmHg Patient Gender: F              HR:           63 bpm. Exam Location:  [facility] Procedure: 2D Echo Indications:     Pleural effusion  History:         Patient has prior history of Echocardiogram examinations, most                  recent 01/31/2023.  Sonographer:     Overton Mam RDCS, FASE Referring Phys:  [id number] Alphonsus Sias GRIFFITH Diagnosing Phys: Lennie Odor MD IMPRESSIONS  1. Left ventricular ejection fraction, by estimation, is 60 to 65%. The left ventricle has normal function. The left ventricle has no regional wall motion abnormalities. Left ventricular diastolic parameters were normal.  2. Right ventricular systolic function is normal. The right ventricular size is normal. There is normal pulmonary artery systolic pressure. The estimated right ventricular systolic pressure is 31.8 mmHg.  3. The mitral valve is grossly normal. Trivial mitral valve regurgitation.  4. The aortic valve is tricuspid. Aortic valve regurgitation is not visualized. No aortic stenosis is present.  5. The inferior vena cava is dilated in size with >50% respiratory variability, suggesting right atrial pressure of 8 mmHg. FINDINGS  Left Ventricle: Left ventricular ejection fraction, by estimation, is 60 to 65%. The left ventricle has normal function. The left ventricle has no regional wall motion abnormalities. The left ventricular internal cavity size was normal in size. There is  no left ventricular hypertrophy. Left ventricular diastolic parameters were normal. Right Ventricle: The right ventricular size is normal. No increase in right ventricular wall  thickness. Right ventricular systolic function is normal. There is normal pulmonary artery systolic pressure. The tricuspid regurgitant velocity is 2.44 m/s, and  with an assumed right atrial pressure of 8 mmHg, the estimated right ventricular systolic pressure is 31.8 mmHg. Left Atrium: Left atrial size was normal in size. Right Atrium: Right atrial size was normal in size. Pericardium: There is no evidence of pericardial effusion. Mitral Valve: The mitral valve is grossly normal. Trivial mitral valve regurgitation. Tricuspid Valve: The tricuspid valve is grossly normal. Tricuspid valve regurgitation is trivial. No evidence of tricuspid stenosis. Aortic Valve: The aortic valve is tricuspid. Aortic valve regurgitation is not visualized. No aortic stenosis is present. Aortic valve peak gradient measures 9.2 mmHg. Pulmonic Valve: The pulmonic valve was grossly normal. Pulmonic  VIEW COMPARISON:  March 07, 2023 FINDINGS: The cardiomediastinal silhouette is unchanged in contour. Bilateral pleural effusions, LEFT greater than RIGHT. No pneumothorax. Bibasilar homogeneous opacities within more focal bandlike opacity in LEFT mid lung. Status post posterior fixation of the thoracic spine. IMPRESSION: 1. Bilateral pleural effusions, LEFT greater than RIGHT. 2. Bibasilar opacities likely reflect atelectasis. Electronically Signed   By: Meda Klinefelter M.D.   On: 03/09/2023 10:15   ECHOCARDIOGRAM COMPLETE  Result Date: 03/08/2023    ECHOCARDIOGRAM REPORT   Patient Name:   Paula Garrett Date of Exam: 03/08/2023 Medical Rec #:  [id number]      Height:       62.0 in Accession #:    [id number]     Weight:       204.4 lb Date of Birth:  12-31-1965      BSA:          1.929 m Patient Age:    57 years       BP:           121/65 mmHg Patient Gender: F              HR:           63 bpm. Exam Location:  [facility] Procedure: 2D Echo Indications:     Pleural effusion  History:         Patient has prior history of Echocardiogram examinations, most                  recent 01/31/2023.  Sonographer:     Overton Mam RDCS, FASE Referring Phys:  [id number] Alphonsus Sias GRIFFITH Diagnosing Phys: Lennie Odor MD IMPRESSIONS  1. Left ventricular ejection fraction, by estimation, is 60 to 65%. The left ventricle has normal function. The left ventricle has no regional wall motion abnormalities. Left ventricular diastolic parameters were normal.  2. Right ventricular systolic function is normal. The right ventricular size is normal. There is normal pulmonary artery systolic pressure. The estimated right ventricular systolic pressure is 31.8 mmHg.  3. The mitral valve is grossly normal. Trivial mitral valve regurgitation.  4. The aortic valve is tricuspid. Aortic valve regurgitation is not visualized. No aortic stenosis is present.  5. The inferior vena cava is dilated in size with >50% respiratory variability, suggesting right atrial pressure of 8 mmHg. FINDINGS  Left Ventricle: Left ventricular ejection fraction, by estimation, is 60 to 65%. The left ventricle has normal function. The left ventricle has no regional wall motion abnormalities. The left ventricular internal cavity size was normal in size. There is  no left ventricular hypertrophy. Left ventricular diastolic parameters were normal. Right Ventricle: The right ventricular size is normal. No increase in right ventricular wall  thickness. Right ventricular systolic function is normal. There is normal pulmonary artery systolic pressure. The tricuspid regurgitant velocity is 2.44 m/s, and  with an assumed right atrial pressure of 8 mmHg, the estimated right ventricular systolic pressure is 31.8 mmHg. Left Atrium: Left atrial size was normal in size. Right Atrium: Right atrial size was normal in size. Pericardium: There is no evidence of pericardial effusion. Mitral Valve: The mitral valve is grossly normal. Trivial mitral valve regurgitation. Tricuspid Valve: The tricuspid valve is grossly normal. Tricuspid valve regurgitation is trivial. No evidence of tricuspid stenosis. Aortic Valve: The aortic valve is tricuspid. Aortic valve regurgitation is not visualized. No aortic stenosis is present. Aortic valve peak gradient measures 9.2 mmHg. Pulmonic Valve: The pulmonic valve was grossly normal. Pulmonic  Hematology/Oncology Consult note [facility] [phone] [phone]  Patient Care Team: Smitty Cords, DO as PCP - General (Family Medicine) Lemar Livings, Merrily Pew, MD (General Surgery) Shelia Media, MD (Internal Medicine) Creig Hines, MD as Consulting Physician (Oncology)   Name of the patient: Paula Garrett  [id number]  [birth year]    Reason for consult: History of multiple myeloma admitted for acute hypoxic respiratory failure   Requesting physician:  DR. Rosezetta Schlatter  Date of visit: 03/14/2023    History of presenting illness- Patient is a 57 year old female with newly diagnosed IgG lambda multiple myeloma status post 1 cycle of Cytoxan Velcade dexamethasone chemotherapy with last cycle given on 02/18/2023 when patient was admitted to the hospital for acute renal failure.  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  Plan was for her to start the RVD based chemotherapy starting cycle 2 upon discharge but patient has had repeated episodes of admission for acute hypoxic respiratory failure.  During her initial hospitalization for the paraspinal mass she was noted to have increased oxygen requirements prior to start of chemotherapy and was given Lasix and eventually weaned off oxygen and discharged.  When she was readmitted to the hospital for renal failure from myeloma she was noted to have increasing oxygen requirements VQ scan on 02/18/2023 showed no evidence of pulmonary embolism but chest x-ray showed moderate left pleural effusion as well as atelectasis versus pneumonia.  She was discharged on oral antibiotics.  Shortly thereafter patient was readmitted to the hospital for acute hypoxic as well as hypercapnic respiratory failure and was in the ICU briefly requiring diuresis.  She was then transferred to floor and was eventually discharged on Augmentin as she  was neutropenic from prior Cytoxan.Patient had thoracentesis on 02/24/2023 which was negative for malignancy and ISH stains on the pleural fluid did not identify any involvement with patient's myeloma.  Repeat thoracentesis on 03/08/2023 was also negative for malignancy.  Patient was seen in the clinic as an outpatient after her her discharge from hospitalization and was dropping her saturations less than 90% with 2 L and was therefore hospitalized Pulmonary was consulted during this hospitalization.  She had a CT chest without contrast on 03/15/2023 which showed worsening consolidation in the right upper lobe as well as a large loculated left pleural effusion.ID has also been on board and patient had chest tube placement thoracentesis shows lymphocytic predominant fluid but cultures negative.  She is currently on Zosyn and linezolid.  Patient underwent bronchoscopy and samples collected on 03/13/2023.  ECOG PS- 2  Pain scale- 6   Review of systems- Review of Systems  Constitutional:  Positive for malaise/fatigue. Negative for chills, fever and weight loss.  HENT:  Negative for congestion, ear discharge and nosebleeds.   Eyes:  Negative for blurred vision.  Respiratory:  Positive for shortness of breath. Negative for cough, hemoptysis, sputum production and wheezing.   Cardiovascular:  Negative for chest pain, palpitations, orthopnea and claudication.  Gastrointestinal:  Negative for abdominal pain, blood in stool, constipation, diarrhea, heartburn, melena, nausea and vomiting.  Genitourinary:  Negative for dysuria, flank pain, frequency, hematuria and urgency.  Musculoskeletal:  Positive for back pain. Negative for joint pain and myalgias.  Skin:  Negative for rash.  Neurological:  Negative for dizziness, tingling, focal weakness, seizures, weakness and headaches.  Endo/Heme/Allergies:  Does not bruise/bleed easily.  Psychiatric/Behavioral:  Negative for depression and suicidal  VIEW COMPARISON:  March 07, 2023 FINDINGS: The cardiomediastinal silhouette is unchanged in contour. Bilateral pleural effusions, LEFT greater than RIGHT. No pneumothorax. Bibasilar homogeneous opacities within more focal bandlike opacity in LEFT mid lung. Status post posterior fixation of the thoracic spine. IMPRESSION: 1. Bilateral pleural effusions, LEFT greater than RIGHT. 2. Bibasilar opacities likely reflect atelectasis. Electronically Signed   By: Meda Klinefelter M.D.   On: 03/09/2023 10:15   ECHOCARDIOGRAM COMPLETE  Result Date: 03/08/2023    ECHOCARDIOGRAM REPORT   Patient Name:   Paula Garrett Date of Exam: 03/08/2023 Medical Rec #:  [id number]      Height:       62.0 in Accession #:    [id number]     Weight:       204.4 lb Date of Birth:  12-31-1965      BSA:          1.929 m Patient Age:    57 years       BP:           121/65 mmHg Patient Gender: F              HR:           63 bpm. Exam Location:  [facility] Procedure: 2D Echo Indications:     Pleural effusion  History:         Patient has prior history of Echocardiogram examinations, most                  recent 01/31/2023.  Sonographer:     Overton Mam RDCS, FASE Referring Phys:  [id number] Alphonsus Sias GRIFFITH Diagnosing Phys: Lennie Odor MD IMPRESSIONS  1. Left ventricular ejection fraction, by estimation, is 60 to 65%. The left ventricle has normal function. The left ventricle has no regional wall motion abnormalities. Left ventricular diastolic parameters were normal.  2. Right ventricular systolic function is normal. The right ventricular size is normal. There is normal pulmonary artery systolic pressure. The estimated right ventricular systolic pressure is 31.8 mmHg.  3. The mitral valve is grossly normal. Trivial mitral valve regurgitation.  4. The aortic valve is tricuspid. Aortic valve regurgitation is not visualized. No aortic stenosis is present.  5. The inferior vena cava is dilated in size with >50% respiratory variability, suggesting right atrial pressure of 8 mmHg. FINDINGS  Left Ventricle: Left ventricular ejection fraction, by estimation, is 60 to 65%. The left ventricle has normal function. The left ventricle has no regional wall motion abnormalities. The left ventricular internal cavity size was normal in size. There is  no left ventricular hypertrophy. Left ventricular diastolic parameters were normal. Right Ventricle: The right ventricular size is normal. No increase in right ventricular wall  thickness. Right ventricular systolic function is normal. There is normal pulmonary artery systolic pressure. The tricuspid regurgitant velocity is 2.44 m/s, and  with an assumed right atrial pressure of 8 mmHg, the estimated right ventricular systolic pressure is 31.8 mmHg. Left Atrium: Left atrial size was normal in size. Right Atrium: Right atrial size was normal in size. Pericardium: There is no evidence of pericardial effusion. Mitral Valve: The mitral valve is grossly normal. Trivial mitral valve regurgitation. Tricuspid Valve: The tricuspid valve is grossly normal. Tricuspid valve regurgitation is trivial. No evidence of tricuspid stenosis. Aortic Valve: The aortic valve is tricuspid. Aortic valve regurgitation is not visualized. No aortic stenosis is present. Aortic valve peak gradient measures 9.2 mmHg. Pulmonic Valve: The pulmonic valve was grossly normal. Pulmonic  VIEW COMPARISON:  March 07, 2023 FINDINGS: The cardiomediastinal silhouette is unchanged in contour. Bilateral pleural effusions, LEFT greater than RIGHT. No pneumothorax. Bibasilar homogeneous opacities within more focal bandlike opacity in LEFT mid lung. Status post posterior fixation of the thoracic spine. IMPRESSION: 1. Bilateral pleural effusions, LEFT greater than RIGHT. 2. Bibasilar opacities likely reflect atelectasis. Electronically Signed   By: Meda Klinefelter M.D.   On: 03/09/2023 10:15   ECHOCARDIOGRAM COMPLETE  Result Date: 03/08/2023    ECHOCARDIOGRAM REPORT   Patient Name:   Paula Garrett Date of Exam: 03/08/2023 Medical Rec #:  [id number]      Height:       62.0 in Accession #:    [id number]     Weight:       204.4 lb Date of Birth:  12-31-1965      BSA:          1.929 m Patient Age:    57 years       BP:           121/65 mmHg Patient Gender: F              HR:           63 bpm. Exam Location:  [facility] Procedure: 2D Echo Indications:     Pleural effusion  History:         Patient has prior history of Echocardiogram examinations, most                  recent 01/31/2023.  Sonographer:     Overton Mam RDCS, FASE Referring Phys:  [id number] Alphonsus Sias GRIFFITH Diagnosing Phys: Lennie Odor MD IMPRESSIONS  1. Left ventricular ejection fraction, by estimation, is 60 to 65%. The left ventricle has normal function. The left ventricle has no regional wall motion abnormalities. Left ventricular diastolic parameters were normal.  2. Right ventricular systolic function is normal. The right ventricular size is normal. There is normal pulmonary artery systolic pressure. The estimated right ventricular systolic pressure is 31.8 mmHg.  3. The mitral valve is grossly normal. Trivial mitral valve regurgitation.  4. The aortic valve is tricuspid. Aortic valve regurgitation is not visualized. No aortic stenosis is present.  5. The inferior vena cava is dilated in size with >50% respiratory variability, suggesting right atrial pressure of 8 mmHg. FINDINGS  Left Ventricle: Left ventricular ejection fraction, by estimation, is 60 to 65%. The left ventricle has normal function. The left ventricle has no regional wall motion abnormalities. The left ventricular internal cavity size was normal in size. There is  no left ventricular hypertrophy. Left ventricular diastolic parameters were normal. Right Ventricle: The right ventricular size is normal. No increase in right ventricular wall  thickness. Right ventricular systolic function is normal. There is normal pulmonary artery systolic pressure. The tricuspid regurgitant velocity is 2.44 m/s, and  with an assumed right atrial pressure of 8 mmHg, the estimated right ventricular systolic pressure is 31.8 mmHg. Left Atrium: Left atrial size was normal in size. Right Atrium: Right atrial size was normal in size. Pericardium: There is no evidence of pericardial effusion. Mitral Valve: The mitral valve is grossly normal. Trivial mitral valve regurgitation. Tricuspid Valve: The tricuspid valve is grossly normal. Tricuspid valve regurgitation is trivial. No evidence of tricuspid stenosis. Aortic Valve: The aortic valve is tricuspid. Aortic valve regurgitation is not visualized. No aortic stenosis is present. Aortic valve peak gradient measures 9.2 mmHg. Pulmonic Valve: The pulmonic valve was grossly normal. Pulmonic  Hematology/Oncology Consult note [facility] [phone] [phone]  Patient Care Team: Smitty Cords, DO as PCP - General (Family Medicine) Lemar Livings, Merrily Pew, MD (General Surgery) Shelia Media, MD (Internal Medicine) Creig Hines, MD as Consulting Physician (Oncology)   Name of the patient: Paula Garrett  [id number]  [birth year]    Reason for consult: History of multiple myeloma admitted for acute hypoxic respiratory failure   Requesting physician:  DR. Rosezetta Schlatter  Date of visit: 03/14/2023    History of presenting illness- Patient is a 57 year old female with newly diagnosed IgG lambda multiple myeloma status post 1 cycle of Cytoxan Velcade dexamethasone chemotherapy with last cycle given on 02/18/2023 when patient was admitted to the hospital for acute renal failure.  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  Plan was for her to start the RVD based chemotherapy starting cycle 2 upon discharge but patient has had repeated episodes of admission for acute hypoxic respiratory failure.  During her initial hospitalization for the paraspinal mass she was noted to have increased oxygen requirements prior to start of chemotherapy and was given Lasix and eventually weaned off oxygen and discharged.  When she was readmitted to the hospital for renal failure from myeloma she was noted to have increasing oxygen requirements VQ scan on 02/18/2023 showed no evidence of pulmonary embolism but chest x-ray showed moderate left pleural effusion as well as atelectasis versus pneumonia.  She was discharged on oral antibiotics.  Shortly thereafter patient was readmitted to the hospital for acute hypoxic as well as hypercapnic respiratory failure and was in the ICU briefly requiring diuresis.  She was then transferred to floor and was eventually discharged on Augmentin as she  was neutropenic from prior Cytoxan.Patient had thoracentesis on 02/24/2023 which was negative for malignancy and ISH stains on the pleural fluid did not identify any involvement with patient's myeloma.  Repeat thoracentesis on 03/08/2023 was also negative for malignancy.  Patient was seen in the clinic as an outpatient after her her discharge from hospitalization and was dropping her saturations less than 90% with 2 L and was therefore hospitalized Pulmonary was consulted during this hospitalization.  She had a CT chest without contrast on 03/15/2023 which showed worsening consolidation in the right upper lobe as well as a large loculated left pleural effusion.ID has also been on board and patient had chest tube placement thoracentesis shows lymphocytic predominant fluid but cultures negative.  She is currently on Zosyn and linezolid.  Patient underwent bronchoscopy and samples collected on 03/13/2023.  ECOG PS- 2  Pain scale- 6   Review of systems- Review of Systems  Constitutional:  Positive for malaise/fatigue. Negative for chills, fever and weight loss.  HENT:  Negative for congestion, ear discharge and nosebleeds.   Eyes:  Negative for blurred vision.  Respiratory:  Positive for shortness of breath. Negative for cough, hemoptysis, sputum production and wheezing.   Cardiovascular:  Negative for chest pain, palpitations, orthopnea and claudication.  Gastrointestinal:  Negative for abdominal pain, blood in stool, constipation, diarrhea, heartburn, melena, nausea and vomiting.  Genitourinary:  Negative for dysuria, flank pain, frequency, hematuria and urgency.  Musculoskeletal:  Positive for back pain. Negative for joint pain and myalgias.  Skin:  Negative for rash.  Neurological:  Negative for dizziness, tingling, focal weakness, seizures, weakness and headaches.  Endo/Heme/Allergies:  Does not bruise/bleed easily.  Psychiatric/Behavioral:  Negative for depression and suicidal  Hematology/Oncology Consult note [facility] [phone] [phone]  Patient Care Team: Smitty Cords, DO as PCP - General (Family Medicine) Lemar Livings, Merrily Pew, MD (General Surgery) Shelia Media, MD (Internal Medicine) Creig Hines, MD as Consulting Physician (Oncology)   Name of the patient: Paula Garrett  [id number]  [birth year]    Reason for consult: History of multiple myeloma admitted for acute hypoxic respiratory failure   Requesting physician:  DR. Rosezetta Schlatter  Date of visit: 03/14/2023    History of presenting illness- Patient is a 57 year old female with newly diagnosed IgG lambda multiple myeloma status post 1 cycle of Cytoxan Velcade dexamethasone chemotherapy with last cycle given on 02/18/2023 when patient was admitted to the hospital for acute renal failure.  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  Plan was for her to start the RVD based chemotherapy starting cycle 2 upon discharge but patient has had repeated episodes of admission for acute hypoxic respiratory failure.  During her initial hospitalization for the paraspinal mass she was noted to have increased oxygen requirements prior to start of chemotherapy and was given Lasix and eventually weaned off oxygen and discharged.  When she was readmitted to the hospital for renal failure from myeloma she was noted to have increasing oxygen requirements VQ scan on 02/18/2023 showed no evidence of pulmonary embolism but chest x-ray showed moderate left pleural effusion as well as atelectasis versus pneumonia.  She was discharged on oral antibiotics.  Shortly thereafter patient was readmitted to the hospital for acute hypoxic as well as hypercapnic respiratory failure and was in the ICU briefly requiring diuresis.  She was then transferred to floor and was eventually discharged on Augmentin as she  was neutropenic from prior Cytoxan.Patient had thoracentesis on 02/24/2023 which was negative for malignancy and ISH stains on the pleural fluid did not identify any involvement with patient's myeloma.  Repeat thoracentesis on 03/08/2023 was also negative for malignancy.  Patient was seen in the clinic as an outpatient after her her discharge from hospitalization and was dropping her saturations less than 90% with 2 L and was therefore hospitalized Pulmonary was consulted during this hospitalization.  She had a CT chest without contrast on 03/15/2023 which showed worsening consolidation in the right upper lobe as well as a large loculated left pleural effusion.ID has also been on board and patient had chest tube placement thoracentesis shows lymphocytic predominant fluid but cultures negative.  She is currently on Zosyn and linezolid.  Patient underwent bronchoscopy and samples collected on 03/13/2023.  ECOG PS- 2  Pain scale- 6   Review of systems- Review of Systems  Constitutional:  Positive for malaise/fatigue. Negative for chills, fever and weight loss.  HENT:  Negative for congestion, ear discharge and nosebleeds.   Eyes:  Negative for blurred vision.  Respiratory:  Positive for shortness of breath. Negative for cough, hemoptysis, sputum production and wheezing.   Cardiovascular:  Negative for chest pain, palpitations, orthopnea and claudication.  Gastrointestinal:  Negative for abdominal pain, blood in stool, constipation, diarrhea, heartburn, melena, nausea and vomiting.  Genitourinary:  Negative for dysuria, flank pain, frequency, hematuria and urgency.  Musculoskeletal:  Positive for back pain. Negative for joint pain and myalgias.  Skin:  Negative for rash.  Neurological:  Negative for dizziness, tingling, focal weakness, seizures, weakness and headaches.  Endo/Heme/Allergies:  Does not bruise/bleed easily.  Psychiatric/Behavioral:  Negative for depression and suicidal  Hematology/Oncology Consult note [facility] [phone] [phone]  Patient Care Team: Smitty Cords, DO as PCP - General (Family Medicine) Lemar Livings, Merrily Pew, MD (General Surgery) Shelia Media, MD (Internal Medicine) Creig Hines, MD as Consulting Physician (Oncology)   Name of the patient: Paula Garrett  [id number]  [birth year]    Reason for consult: History of multiple myeloma admitted for acute hypoxic respiratory failure   Requesting physician:  DR. Rosezetta Schlatter  Date of visit: 03/14/2023    History of presenting illness- Patient is a 57 year old female with newly diagnosed IgG lambda multiple myeloma status post 1 cycle of Cytoxan Velcade dexamethasone chemotherapy with last cycle given on 02/18/2023 when patient was admitted to the hospital for acute renal failure.  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  Plan was for her to start the RVD based chemotherapy starting cycle 2 upon discharge but patient has had repeated episodes of admission for acute hypoxic respiratory failure.  During her initial hospitalization for the paraspinal mass she was noted to have increased oxygen requirements prior to start of chemotherapy and was given Lasix and eventually weaned off oxygen and discharged.  When she was readmitted to the hospital for renal failure from myeloma she was noted to have increasing oxygen requirements VQ scan on 02/18/2023 showed no evidence of pulmonary embolism but chest x-ray showed moderate left pleural effusion as well as atelectasis versus pneumonia.  She was discharged on oral antibiotics.  Shortly thereafter patient was readmitted to the hospital for acute hypoxic as well as hypercapnic respiratory failure and was in the ICU briefly requiring diuresis.  She was then transferred to floor and was eventually discharged on Augmentin as she  was neutropenic from prior Cytoxan.Patient had thoracentesis on 02/24/2023 which was negative for malignancy and ISH stains on the pleural fluid did not identify any involvement with patient's myeloma.  Repeat thoracentesis on 03/08/2023 was also negative for malignancy.  Patient was seen in the clinic as an outpatient after her her discharge from hospitalization and was dropping her saturations less than 90% with 2 L and was therefore hospitalized Pulmonary was consulted during this hospitalization.  She had a CT chest without contrast on 03/15/2023 which showed worsening consolidation in the right upper lobe as well as a large loculated left pleural effusion.ID has also been on board and patient had chest tube placement thoracentesis shows lymphocytic predominant fluid but cultures negative.  She is currently on Zosyn and linezolid.  Patient underwent bronchoscopy and samples collected on 03/13/2023.  ECOG PS- 2  Pain scale- 6   Review of systems- Review of Systems  Constitutional:  Positive for malaise/fatigue. Negative for chills, fever and weight loss.  HENT:  Negative for congestion, ear discharge and nosebleeds.   Eyes:  Negative for blurred vision.  Respiratory:  Positive for shortness of breath. Negative for cough, hemoptysis, sputum production and wheezing.   Cardiovascular:  Negative for chest pain, palpitations, orthopnea and claudication.  Gastrointestinal:  Negative for abdominal pain, blood in stool, constipation, diarrhea, heartburn, melena, nausea and vomiting.  Genitourinary:  Negative for dysuria, flank pain, frequency, hematuria and urgency.  Musculoskeletal:  Positive for back pain. Negative for joint pain and myalgias.  Skin:  Negative for rash.  Neurological:  Negative for dizziness, tingling, focal weakness, seizures, weakness and headaches.  Endo/Heme/Allergies:  Does not bruise/bleed easily.  Psychiatric/Behavioral:  Negative for depression and suicidal  VIEW COMPARISON:  March 07, 2023 FINDINGS: The cardiomediastinal silhouette is unchanged in contour. Bilateral pleural effusions, LEFT greater than RIGHT. No pneumothorax. Bibasilar homogeneous opacities within more focal bandlike opacity in LEFT mid lung. Status post posterior fixation of the thoracic spine. IMPRESSION: 1. Bilateral pleural effusions, LEFT greater than RIGHT. 2. Bibasilar opacities likely reflect atelectasis. Electronically Signed   By: Meda Klinefelter M.D.   On: 03/09/2023 10:15   ECHOCARDIOGRAM COMPLETE  Result Date: 03/08/2023    ECHOCARDIOGRAM REPORT   Patient Name:   Paula Garrett Date of Exam: 03/08/2023 Medical Rec #:  [id number]      Height:       62.0 in Accession #:    [id number]     Weight:       204.4 lb Date of Birth:  12-31-1965      BSA:          1.929 m Patient Age:    57 years       BP:           121/65 mmHg Patient Gender: F              HR:           63 bpm. Exam Location:  [facility] Procedure: 2D Echo Indications:     Pleural effusion  History:         Patient has prior history of Echocardiogram examinations, most                  recent 01/31/2023.  Sonographer:     Overton Mam RDCS, FASE Referring Phys:  [id number] Alphonsus Sias GRIFFITH Diagnosing Phys: Lennie Odor MD IMPRESSIONS  1. Left ventricular ejection fraction, by estimation, is 60 to 65%. The left ventricle has normal function. The left ventricle has no regional wall motion abnormalities. Left ventricular diastolic parameters were normal.  2. Right ventricular systolic function is normal. The right ventricular size is normal. There is normal pulmonary artery systolic pressure. The estimated right ventricular systolic pressure is 31.8 mmHg.  3. The mitral valve is grossly normal. Trivial mitral valve regurgitation.  4. The aortic valve is tricuspid. Aortic valve regurgitation is not visualized. No aortic stenosis is present.  5. The inferior vena cava is dilated in size with >50% respiratory variability, suggesting right atrial pressure of 8 mmHg. FINDINGS  Left Ventricle: Left ventricular ejection fraction, by estimation, is 60 to 65%. The left ventricle has normal function. The left ventricle has no regional wall motion abnormalities. The left ventricular internal cavity size was normal in size. There is  no left ventricular hypertrophy. Left ventricular diastolic parameters were normal. Right Ventricle: The right ventricular size is normal. No increase in right ventricular wall  thickness. Right ventricular systolic function is normal. There is normal pulmonary artery systolic pressure. The tricuspid regurgitant velocity is 2.44 m/s, and  with an assumed right atrial pressure of 8 mmHg, the estimated right ventricular systolic pressure is 31.8 mmHg. Left Atrium: Left atrial size was normal in size. Right Atrium: Right atrial size was normal in size. Pericardium: There is no evidence of pericardial effusion. Mitral Valve: The mitral valve is grossly normal. Trivial mitral valve regurgitation. Tricuspid Valve: The tricuspid valve is grossly normal. Tricuspid valve regurgitation is trivial. No evidence of tricuspid stenosis. Aortic Valve: The aortic valve is tricuspid. Aortic valve regurgitation is not visualized. No aortic stenosis is present. Aortic valve peak gradient measures 9.2 mmHg. Pulmonic Valve: The pulmonic valve was grossly normal. Pulmonic  VIEW COMPARISON:  March 07, 2023 FINDINGS: The cardiomediastinal silhouette is unchanged in contour. Bilateral pleural effusions, LEFT greater than RIGHT. No pneumothorax. Bibasilar homogeneous opacities within more focal bandlike opacity in LEFT mid lung. Status post posterior fixation of the thoracic spine. IMPRESSION: 1. Bilateral pleural effusions, LEFT greater than RIGHT. 2. Bibasilar opacities likely reflect atelectasis. Electronically Signed   By: Meda Klinefelter M.D.   On: 03/09/2023 10:15   ECHOCARDIOGRAM COMPLETE  Result Date: 03/08/2023    ECHOCARDIOGRAM REPORT   Patient Name:   Paula Garrett Date of Exam: 03/08/2023 Medical Rec #:  [id number]      Height:       62.0 in Accession #:    [id number]     Weight:       204.4 lb Date of Birth:  12-31-1965      BSA:          1.929 m Patient Age:    57 years       BP:           121/65 mmHg Patient Gender: F              HR:           63 bpm. Exam Location:  [facility] Procedure: 2D Echo Indications:     Pleural effusion  History:         Patient has prior history of Echocardiogram examinations, most                  recent 01/31/2023.  Sonographer:     Overton Mam RDCS, FASE Referring Phys:  [id number] Alphonsus Sias GRIFFITH Diagnosing Phys: Lennie Odor MD IMPRESSIONS  1. Left ventricular ejection fraction, by estimation, is 60 to 65%. The left ventricle has normal function. The left ventricle has no regional wall motion abnormalities. Left ventricular diastolic parameters were normal.  2. Right ventricular systolic function is normal. The right ventricular size is normal. There is normal pulmonary artery systolic pressure. The estimated right ventricular systolic pressure is 31.8 mmHg.  3. The mitral valve is grossly normal. Trivial mitral valve regurgitation.  4. The aortic valve is tricuspid. Aortic valve regurgitation is not visualized. No aortic stenosis is present.  5. The inferior vena cava is dilated in size with >50% respiratory variability, suggesting right atrial pressure of 8 mmHg. FINDINGS  Left Ventricle: Left ventricular ejection fraction, by estimation, is 60 to 65%. The left ventricle has normal function. The left ventricle has no regional wall motion abnormalities. The left ventricular internal cavity size was normal in size. There is  no left ventricular hypertrophy. Left ventricular diastolic parameters were normal. Right Ventricle: The right ventricular size is normal. No increase in right ventricular wall  thickness. Right ventricular systolic function is normal. There is normal pulmonary artery systolic pressure. The tricuspid regurgitant velocity is 2.44 m/s, and  with an assumed right atrial pressure of 8 mmHg, the estimated right ventricular systolic pressure is 31.8 mmHg. Left Atrium: Left atrial size was normal in size. Right Atrium: Right atrial size was normal in size. Pericardium: There is no evidence of pericardial effusion. Mitral Valve: The mitral valve is grossly normal. Trivial mitral valve regurgitation. Tricuspid Valve: The tricuspid valve is grossly normal. Tricuspid valve regurgitation is trivial. No evidence of tricuspid stenosis. Aortic Valve: The aortic valve is tricuspid. Aortic valve regurgitation is not visualized. No aortic stenosis is present. Aortic valve peak gradient measures 9.2 mmHg. Pulmonic Valve: The pulmonic valve was grossly normal. Pulmonic

## 2023-03-14 NOTE — Progress Notes (Signed)
Progress Note   Patient: Paula Garrett GEX:[id number] DOB: 1965/08/31 DOA: 03/06/2023     8 DOS: the patient was seen and examined on 03/14/2023  Subjective:  Patient seen and examined at bedside this morning Still has some shortness of breath however improving Still awaiting on bronchoalveolar lavage results    Brief hospital course: HPI on admission 03/06/23:  "Paula Garrett is a 57 year old female with history of current tobacco use, recently diagnosed multiple myeloma status post cycle 1 of cyclophosphamide Velcade dexamethasone chemotherapy, history of COPD, who presents emergency department from outpatient cancer center for chief concerns of shortness of breath."   Vitals in the ED showed temperature of 98.3, respiration rate 24, heart rate of 73, blood pressure 149/133, SpO2 96% on 6 L nasal cannula.  Chest x-ray showed a large left pleural effusion over 50% of left hemithorax.   See H&P for full HPI on admission and detailed ER course.  Patient was in respiratory distress requiring BiPAP on admission.   Admitted to stepdown unit on BiPAP. Pulmonology and IR consulted for thoracentesis vs potential chest tube placement.   Further hospital course and management as outlined below.     Assessment and Plan: * Recurrent left pleural effusion Exudative pleural effusion Pulmonology performed bedside U/S-guided thoracentesis (9/20), 1600 cc fluid removed --Pulmonology and ID are following --Left chest tube placed 9/22 and removed on 03/13/2023 --IV lasix 20 mg x 2 (9/21, 9/22) - hold diuresis, Cr rising. --Follow pleural fluid cultures and cytology.  --Echo preserved EF, grade I diastolic dysfunction --Follow up consultant recommendations --ID changed Vanc >> Linezolid 9/23 -- Continue on Linezolid and Zosyn Patient is status post bronchoscopy on 03/13/2023 by Dr. Sharlyn Bologna lavage samples sent results pending Chest tube removed on 03/13/2023 I have discussed the case  with infectious disease specialist Dr. Rivka Safer   COPD with acute exacerbation (HCC) Seems more likely due to left pleural effusion.  Pt not wheezing on exam. Given Solu-medrol in ED. Defer further steroids for now. Pulmonology following. Continue Duonebs QID Dulera & Incruse   Acute on chronic respiratory failure with hypoxemia (HCC) Pt recently discharged on home oxygen. Presented in respiratory distress requiring BiPAP. Currently on 3 L intranasal oxygen Secondary to recurrent left sided pleural effusion complicated by patient with severe COPD. 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   --Resumed on home regimen - gabapentin, fentanyl patch (last placed 1700 on 9/18 at home, confirmed by family), PO dilaudid PRN -- Continue IV dilaudid for breakthrough pain -- requiring for chest tube pain at this time   AKI (acute kidney injury) (HCC) Baseline renal function unclear, has been recovering from severe AKI related to multiple myeloma, steadily improving since starting treatment.  Mild AKI currently due to diuresis most likely. 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   -Continue to monitor CBC closely   Anxiety Continue on home Lexapro, Klonopin BID   OSA (obstructive sleep apnea) Not on CPAP   Tobacco use disorder Nicotine patch ordered   Hypothyroidism Continue Synthroid   Benign essential hypertension Continue home amlodipine PRN hydralazine   Barrett's esophagus without dysplasia Continue Protonix   Hypokalemia Mild, due to diuresis. K was  replaced. Monitor & replace K as needed   Hypomagnesemia Mg 1.6 on 9/24 was  replaced Monitor & replace Mg as needed.   SOB (shortness of breath) Mostly due to large left pleural effusion. Initially required BiPAP. Improved after thoracentesis.   Status post bariatric surgery No acute issues. Monitor   Postoperative hypothyroidism .   Obesity (BMI 30-39.9) Body mass index is 37.38 kg/m. Complicates overall care and prognosis.  Recommend lifestyle modifications including physical activity and diet for weight loss and overall long-term health.       Physical Exam: General exam: awake, appears drowsy, no acute distress, mildly ill appearing HEENT: moist mucus membranes, hearing grossly normal  Respiratory system: Chest tubes in place in the left lung in place with serosanguinous fluid in chamber, diminished bases L>R, normal respiratory effort at rest, on 4 L/min [place] o2. Cardiovascular system: normal S1/S2, RRR, trace LE edema.   Gastrointestinal system: soft, NT, ND Central nervous system: A&O x 3. no gross focal neurologic deficits, normal speech Extremities: moves all, no edema, normal tone Skin: dry, intact, normal temperature Psychiatry: normal mood, congruent affect, judgement and insight appear normal     Data Reviewed: I have reviewed patient's labs as shown below Pending - pleural fluid cultures & cytology, aspergillus Ag, fungitell     Family Communication: none present on rounds today. Pt able to update.     Disposition: Status is: Inpatient Remains inpatient appropriate because: severity of illness, chest tube in place, cultures pending, ongoing O2 needs above baseline    Planned Discharge Destination: Home and Home with Home Health   Vitals:   03/13/23 2337 03/14/23 0717 03/14/23 0827 03/14/23 1624  BP: 114/65  101/69 (!) 124/57  Pulse: 79  73 77  Resp: 20  18 18   Temp: 98.6 F (37 C)  98.1 F (36.7 C) 98 F (36.7 C)  TempSrc: Oral     SpO2: 97% 94% 90% 90%  Weight:      Height:           Author: Loyce Dys,  MD 03/14/2023 5:18 PM  For on call review [email or web address].

## 2023-03-14 NOTE — Telephone Encounter (Signed)
Pharmacy Patient Advocate Encounter   Received notification that prior authorization for Linezolid 600MG  tabletsis required/requested.   Insurance verification completed.   The patient is insured through [employer] .   Per test claim: PA required; PA submitted to [employer] via CoverMyMeds Key/confirmation #/EOC [id number] Status is pending

## 2023-03-14 NOTE — Consult Note (Signed)
Pharmacy Antibiotic Note  Paula Garrett is a 57 y.o. female admitted on 03/06/2023 with recurrent left pleural effusion.  Pharmacy has been consulted for Zosyn dosing for pneumonia.  Today, 03/14/2023 Day 5 of Zosyn/linezolid  WBC 6.4  Afebrile  Scr 1.72 today with estimated CrCl 37.3 mL/min  Plan: Continue Zosyn 3.375g IV Q8H Patient also on linezolid 600 mg IV Q12H  Monitor platelets while on linezolid F/u sputum culture results and cytology from BAL  Pharmacy will continue to monitor and dose adjust appropriately   Height: 5\' 2"  (157.5 cm) Weight: 92.7 kg (204 lb 5.9 oz) IBW/kg (Calculated) : 50.1  Temp (24hrs), Avg:98.3 F (36.8 C), Min:98.1 F (36.7 C), Max:98.6 F (37 C)  Recent Labs  Lab 03/10/23 0537 03/11/23 0523 03/12/23 0507 03/13/23 0333 03/14/23 0427  WBC 10.6* 10.5 8.6 6.4 6.8  CREATININE 1.76* 1.84* 1.74* 1.70* 1.72*    Estimated Creatinine Clearance: 37.3 mL/min (A) (by C-G formula based on SCr of 1.72 mg/dL (H)).    Allergies  Allergen Reactions   Hctz [Hydrochlorothiazide]     Hyponatremia    Antimicrobials this admission: Zosyn 9/22 >>  Vancomycin 9/23 x 1 Linezolid 9/23 >>  Dose adjustments this admission: N/A  Microbiology results: 9/20 Pleural fluid: NG final  9/21 MRSA PCR: detected 9/25 Sputum culture: pending   Thank you for allowing pharmacy to be a part of this patient's care.  Bettey Costa, PharmD Clinical Pharmacist 03/14/2023 10:14 AM

## 2023-03-15 DIAGNOSIS — J90 Pleural effusion, not elsewhere classified: Secondary | ICD-10-CM | POA: Diagnosis not present

## 2023-03-15 LAB — CBC WITH DIFFERENTIAL/PLATELET
Abs Immature Granulocytes: 0.04 10*3/uL (ref 0.00–0.07)
Basophils Absolute: 0.1 10*3/uL (ref 0.0–0.1)
Basophils Relative: 1 %
Eosinophils Absolute: 0.1 10*3/uL (ref 0.0–0.5)
Eosinophils Relative: 1 %
HCT: 24.1 % — ABNORMAL LOW (ref 36.0–46.0)
Hemoglobin: 7.7 g/dL — ABNORMAL LOW (ref 12.0–15.0)
Immature Granulocytes: 1 %
Lymphocytes Relative: 28 %
Lymphs Abs: 2.4 10*3/uL (ref 0.7–4.0)
MCH: 29.8 pg (ref 26.0–34.0)
MCHC: 32 g/dL (ref 30.0–36.0)
MCV: 93.4 fL (ref 80.0–100.0)
Monocytes Absolute: 0.7 10*3/uL (ref 0.1–1.0)
Monocytes Relative: 8 %
Neutro Abs: 5.4 10*3/uL (ref 1.7–7.7)
Neutrophils Relative %: 61 %
Platelets: 261 10*3/uL (ref 150–400)
RBC: 2.58 MIL/uL — ABNORMAL LOW (ref 3.87–5.11)
RDW: 15.1 % (ref 11.5–15.5)
WBC: 8.7 10*3/uL (ref 4.0–10.5)
nRBC: 0 % (ref 0.0–0.2)

## 2023-03-15 LAB — BASIC METABOLIC PANEL
Anion gap: 9 (ref 5–15)
BUN: 12 mg/dL (ref 6–20)
CO2: 28 mmol/L (ref 22–32)
Calcium: 8.9 mg/dL (ref 8.9–10.3)
Chloride: 103 mmol/L (ref 98–111)
Creatinine, Ser: 1.74 mg/dL — ABNORMAL HIGH (ref 0.44–1.00)
GFR, Estimated: 33 mL/min — ABNORMAL LOW (ref >60)
Glucose, Bld: 87 mg/dL (ref 70–99)
Potassium: 3.6 mmol/L (ref 3.5–5.1)
Sodium: 140 mmol/L (ref 135–145)

## 2023-03-15 LAB — MISC LABCORP TEST (SEND OUT): Labcorp test code: 164284

## 2023-03-15 MED ORDER — IPRATROPIUM-ALBUTEROL 0.5-2.5 (3) MG/3ML IN SOLN
3.0000 mL | Freq: Three times a day (TID) | RESPIRATORY_TRACT | Status: DC
  Administered 2023-03-15 – 2023-03-17 (×8): 3 mL via RESPIRATORY_TRACT
  Filled 2023-03-15 (×7): qty 3
  Filled 2023-03-15: qty 9
  Filled 2023-03-15: qty 3

## 2023-03-15 NOTE — Progress Notes (Signed)
Mobility Specialist - Progress Note   03/15/23 1004  Mobility  Activity Ambulated independently in hallway;Stood at bedside;Dangled on edge of bed  Level of Assistance Independent  Assistive Device None  Distance Ambulated (ft) 200 ft  Activity Response Tolerated well  Mobility Referral Yes  $Mobility charge 1 Mobility  Mobility Specialist Start Time (ACUTE ONLY) [id number]  Mobility Specialist Stop Time (ACUTE ONLY) 0932  Mobility Specialist Time Calculation (min) (ACUTE ONLY) 11 min   Pt supine in bed on 2L upon arrival. Pt STS and ambulates in hallway indep. Pt desats to 80 Spo2 at end of ambulation. Spo2 rises up to 88-90 within 1 minute rest break. RN made aware. Pt returns to bed with needs in room and MD entering room.   Terrilyn Saver  Mobility Specialist  03/15/23 10:07 AM

## 2023-03-15 NOTE — Progress Notes (Signed)
Progress Note   Patient: Paula Garrett LKG:[id number] DOB: 1966/03/01 DOA: 03/06/2023     9 DOS: the patient was seen and examined on 03/15/2023     Subjective:  Patient seen and examined at bedside this morning Patient was able to move her bowels but however desaturated today I said he needs on 2 L Patient will need increased oxygen during ambulation and I have discussed this with the mobility team. Denied worsening shortness of breath     Brief hospital course: HPI on admission 03/06/23:  "Biancia Gedeon is a 57 year old female with history of current tobacco use, recently diagnosed multiple myeloma status post cycle 1 of cyclophosphamide Velcade dexamethasone chemotherapy, history of COPD, who presents emergency department from outpatient cancer center for chief concerns of shortness of breath."   Vitals in the ED showed temperature of 98.3, respiration rate 24, heart rate of 73, blood pressure 149/133, SpO2 96% on 6 L nasal cannula.  Chest x-ray showed a large left pleural effusion over 50% of left hemithorax.   See H&P for full HPI on admission and detailed ER course.  Patient was in respiratory distress requiring BiPAP on admission.   Admitted to stepdown unit on BiPAP. Pulmonology and IR consulted for thoracentesis vs potential chest tube placement.   Further hospital course and management as outlined below.     Assessment and Plan: * Recurrent left pleural effusion Exudative pleural effusion Pulmonology performed bedside U/S-guided thoracentesis (9/20), 1600 cc fluid removed --Pulmonology and ID are following --Left chest tube placed 9/22 and removed on 03/13/2023 --IV lasix 20 mg x 2 (9/21, 9/22) - hold diuresis, Cr rising. --Follow pleural fluid cultures and cytology.  --Echo preserved EF, grade I diastolic dysfunction --Follow up consultant recommendations --ID changed Vanc >> Linezolid 9/23 -- Continue on Linezolid  Zosyn have been discontinued by ID on  03/14/2023 Patient is status post bronchoscopy on 03/13/2023 by Dr. Sharlyn Bologna lavage samples sent results pending Chest tube removed on 03/13/2023 I have discussed the case with infectious disease specialist Dr. Rivka Safer   COPD with acute exacerbation (HCC) Seems more likely due to left pleural effusion.  Pt not wheezing on exam. Given Solu-medrol in ED. Defer further steroids for now. Pulmonology following. Continue Duonebs QID Dulera & Incruse   Acute on chronic respiratory failure with hypoxemia (HCC) Pt recently discharged on home oxygen. Presented in respiratory distress requiring BiPAP. Currently on 3 L intranasal oxygen Secondary to recurrent left sided pleural effusion complicated by patient with severe COPD. 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   --Resumed on home regimen - gabapentin, fentanyl patch (last placed 1700 on 9/18 at home, confirmed by family), PO dilaudid PRN -- Continue IV dilaudid for breakthrough pain -- requiring for chest tube pain at this time   AKI (acute kidney injury) (HCC) Baseline renal function unclear, has been recovering from severe AKI related to multiple myeloma, steadily improving since starting treatment.  Mild AKI currently due to diuresis most likely. 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   -Continue to monitor CBC closely   Anxiety Continue on home Lexapro, Klonopin BID   OSA (obstructive sleep apnea) Not on CPAP   Tobacco use  disorder Nicotine patch ordered   Hypothyroidism Continue Synthroid   Benign essential hypertension Continue home amlodipine PRN  hydralazine   Barrett's esophagus without dysplasia Continue Protonix   Hypokalemia Mild, due to diuresis. K was replaced. Monitor & replace K as needed   Hypomagnesemia Mg 1.6 on 9/24 was replaced Monitor & replace Mg as needed.   SOB (shortness of breath) Mostly due to large left pleural effusion. Initially required BiPAP. Improved after thoracentesis.   Status post bariatric surgery No acute issues. Monitor   Postoperative hypothyroidism .   Obesity (BMI 30-39.9) Body mass index is 37.38 kg/m. Complicates overall care and prognosis.  Recommend lifestyle modifications including physical activity and diet for weight loss and overall long-term health.       Physical Exam: General exam: awake, appears drowsy, no acute distress, mildly ill appearing HEENT: moist mucus membranes, hearing grossly normal  Respiratory system: Chest tubes in place in the left lung in place with serosanguinous fluid in chamber, diminished bases L>R, normal respiratory effort at rest, on 4 L/min [place] o2. Cardiovascular system: normal S1/S2, RRR, trace LE edema.   Gastrointestinal system: soft, NT, ND Central nervous system: A&O x 3. no gross focal neurologic deficits, normal speech Extremities: moves all, no edema, normal tone Skin: dry, intact, normal temperature Psychiatry: normal mood, congruent affect, judgement and insight appear normal     Data Reviewed: I have reviewed patient's labs as documented below as well as infectious disease documentation and nursing documentation     Latest Ref Rng & Units 03/15/2023    4:56 AM 03/14/2023    4:27 AM 03/13/2023    3:33 AM  CBC  WBC 4.0 - 10.5 K/uL 8.7  6.8  6.4   Hemoglobin 12.0 - 15.0 g/dL 7.7  7.5  7.4   Hematocrit 36.0 - 46.0 % 24.1  23.7  22.4   Platelets 150 - 400 K/uL 261  258  267        Latest Ref Rng & Units 03/15/2023    4:56 AM 03/14/2023    4:27 AM 03/13/2023    3:33 AM  BMP  Glucose 70 - 99 mg/dL 87  71  88   BUN 6 - 20  mg/dL 12  13  15    Creatinine 0.44 - 1.00 mg/dL 1.61  0.96  0.45   Sodium 135 - 145 mmol/L 140  139  139   Potassium 3.5 - 5.1 mmol/L 3.6  3.5  3.9   Chloride 98 - 111 mmol/L 103  100  102   CO2 22 - 32 mmol/L 28  30  29    Calcium 8.9 - 10.3 mg/dL 8.9  8.9  8.9         Family Communication: none present on rounds today. Pt able to update.     Disposition: Status is: Inpatient Remains inpatient appropriate because: severity of illness, chest tube in place, cultures pending, ongoing O2 needs above baseline    Planned Discharge Destination: Home and Home with Home Health        Vitals:   03/14/23 2336 03/15/23 0734 03/15/23 0824 03/15/23 1349  BP: 127/61  (!) 150/77   Pulse: 87  74   Resp: 18  18   Temp: 98.4 F (36.9 C)  98 F (36.7 C)   TempSrc:      SpO2: 90% 95% 90% 95%  Weight:      Height:         Author: Loyce Dys, MD 03/15/2023 3:00  PM  For on call review [email or web address].

## 2023-03-15 NOTE — Plan of Care (Signed)
  Problem: Education: Goal: Knowledge of the prescribed therapeutic regimen will improve Outcome: Progressing   Problem: Clinical Measurements: Goal: Diagnostic test results will improve Outcome: Progressing   Problem: Pain Management: Goal: Pain level will decrease Outcome: Progressing   Problem: Skin Integrity: Goal: Will show signs of wound healing Outcome: Progressing   Problem: Clinical Measurements: Goal: Respiratory complications will improve Outcome: Progressing Goal: Cardiovascular complication will be avoided Outcome: Progressing

## 2023-03-16 DIAGNOSIS — J90 Pleural effusion, not elsewhere classified: Secondary | ICD-10-CM | POA: Diagnosis not present

## 2023-03-16 LAB — BASIC METABOLIC PANEL
Anion gap: 9 (ref 5–15)
BUN: 9 mg/dL (ref 6–20)
CO2: 29 mmol/L (ref 22–32)
Calcium: 9.1 mg/dL (ref 8.9–10.3)
Chloride: 102 mmol/L (ref 98–111)
Creatinine, Ser: 1.7 mg/dL — ABNORMAL HIGH (ref 0.44–1.00)
GFR, Estimated: 34 mL/min — ABNORMAL LOW (ref >60)
Glucose, Bld: 100 mg/dL — ABNORMAL HIGH (ref 70–99)
Potassium: 3.3 mmol/L — ABNORMAL LOW (ref 3.5–5.1)
Sodium: 140 mmol/L (ref 135–145)

## 2023-03-16 LAB — CULTURE, RESPIRATORY W GRAM STAIN

## 2023-03-16 LAB — CULTURE, BAL-QUANTITATIVE W GRAM STAIN: Culture: NO GROWTH — AB

## 2023-03-16 LAB — CBC WITH DIFFERENTIAL/PLATELET
Abs Immature Granulocytes: 0.02 10*3/uL (ref 0.00–0.07)
Basophils Absolute: 0.1 10*3/uL (ref 0.0–0.1)
Basophils Relative: 1 %
Eosinophils Absolute: 0 10*3/uL (ref 0.0–0.5)
Eosinophils Relative: 1 %
HCT: 24.6 % — ABNORMAL LOW (ref 36.0–46.0)
Hemoglobin: 8 g/dL — ABNORMAL LOW (ref 12.0–15.0)
Immature Granulocytes: 0 %
Lymphocytes Relative: 34 %
Lymphs Abs: 2.1 10*3/uL (ref 0.7–4.0)
MCH: 30.2 pg (ref 26.0–34.0)
MCHC: 32.5 g/dL (ref 30.0–36.0)
MCV: 92.8 fL (ref 80.0–100.0)
Monocytes Absolute: 0.7 10*3/uL (ref 0.1–1.0)
Monocytes Relative: 11 %
Neutro Abs: 3.3 10*3/uL (ref 1.7–7.7)
Neutrophils Relative %: 53 %
Platelets: 260 10*3/uL (ref 150–400)
RBC: 2.65 MIL/uL — ABNORMAL LOW (ref 3.87–5.11)
RDW: 14.7 % (ref 11.5–15.5)
WBC: 6.1 10*3/uL (ref 4.0–10.5)
nRBC: 0 % (ref 0.0–0.2)

## 2023-03-16 MED ORDER — POTASSIUM CHLORIDE CRYS ER 20 MEQ PO TBCR
40.0000 meq | EXTENDED_RELEASE_TABLET | ORAL | Status: AC
  Administered 2023-03-16 (×2): 40 meq via ORAL
  Filled 2023-03-16 (×2): qty 2

## 2023-03-16 NOTE — Progress Notes (Signed)
Progress Note   Patient: Paula Garrett JYN:[id number] DOB: [birth year] DOA: 03/06/2023     10 DOS: the patient was seen and examined on 03/16/2023     Subjective:  Patient seen and examined at bedside this morning She continues to require 2 L of oxygen at rest She admits to improvement in her shortness of breath Denies nausea vomiting abdominal pain or chest pain     Brief hospital course: HPI on admission 03/06/23:  "Siclali Juvera is a 57 year old female with history of current tobacco use, recently diagnosed multiple myeloma status post cycle 1 of cyclophosphamide Velcade dexamethasone chemotherapy, history of COPD, who presents emergency department from outpatient cancer center for chief concerns of shortness of breath."   Vitals in the ED showed temperature of 98.3, respiration rate 24, heart rate of 73, blood pressure 149/133, SpO2 96% on 6 L nasal cannula.  Chest x-ray showed a large left pleural effusion over 50% of left hemithorax.   See H&P for full HPI on admission and detailed ER course.  Patient was in respiratory distress requiring BiPAP on admission.   Admitted to stepdown unit on BiPAP. Pulmonology and IR consulted for thoracentesis vs potential chest tube placement.   Further hospital course and management as outlined below.     Assessment and Plan: * Recurrent left pleural effusion Exudative pleural effusion Pulmonology performed bedside U/S-guided thoracentesis (9/20), 1600 cc fluid removed --Pulmonology and ID are following --Left chest tube placed 9/22 and removed on 03/13/2023 --IV lasix 20 mg x 2 (9/21, 9/22) - hold diuresis, Cr rising. --Follow pleural fluid cultures and cytology.  --Echo preserved EF, grade I diastolic dysfunction --Follow up consultant recommendations --ID changed Vanc >> Linezolid 9/23 Continue on Linezolid  Zosyn have been discontinued by ID on 03/14/2023 Patient is status post bronchoscopy on 03/13/2023 by Dr. Sharlyn Bologna  lavage samples sent results pending Chest tube removed on 03/13/2023 I have discussed the case with infectious disease specialist Dr. Rivka Safer   COPD with acute exacerbation (HCC) Seems more likely due to left pleural effusion.  Pt not wheezing on exam. Given Solu-medrol in ED. Defer further steroids for now. Pulmonology following. Continue Duonebs QID Continue Dulera & Incruse   Acute on chronic respiratory failure with hypoxemia (HCC) Pt recently discharged on home oxygen. Presented in respiratory distress requiring BiPAP. Currently on 2 L intranasal oxygen Secondary to recurrent left sided pleural effusion complicated by patient with severe COPD. 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   --Resumed on home regimen - gabapentin, fentanyl patch (last placed 1700 on 9/18 at home, confirmed by family), PO dilaudid PRN -- Continue IV dilaudid for breakthrough pain -- requiring for chest tube pain at this time   AKI (acute kidney injury) (HCC) Baseline renal function unclear, has been recovering from severe AKI related to multiple myeloma, steadily improving since starting treatment.  Mild AKI currently due to diuresis most likely. 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   -Continue to monitor CBC closely   Anxiety Continue on home Lexapro, Klonopin BID   OSA (obstructive sleep apnea) Not on CPAP   Tobacco use disorder Nicotine patch ordered   Hypothyroidism Continue Synthroid   Benign  essential hypertension Continue home amlodipine PRN hydralazine   Barrett's esophagus without dysplasia Continue Protonix    Hypokalemia Mild, due to diuresis. K was replaced. Monitor & replace K as needed   Hypomagnesemia Mg 1.6 on 9/24 was replaced Monitor & replace Mg as needed.   SOB (shortness of breath) Mostly due to large left pleural effusion. Initially required BiPAP. Improved after thoracentesis.   Status post bariatric surgery No acute issues. Monitor   Postoperative hypothyroidism .   Obesity (BMI 30-39.9) Body mass index is 37.38 kg/m. Complicates overall care and prognosis.  Recommend lifestyle modifications including physical activity and diet for weight loss and overall long-term health.       Physical Exam: General exam: awake, appears drowsy, no acute distress, mildly ill appearing HEENT: moist mucus membranes, hearing grossly normal  Respiratory system: Chest tubes in place in the left lung in place with serosanguinous fluid in chamber, diminished bases L>R, normal respiratory effort at rest, on 4 L/min [place] o2. Cardiovascular system: normal S1/S2, RRR, trace LE edema.   Gastrointestinal system: soft, NT, ND Central nervous system: A&O x 3. no gross focal neurologic deficits, normal speech Extremities: moves all, no edema, normal tone Skin: dry, intact, normal temperature Psychiatry: normal mood, congruent affect, judgement and insight appear normal     Data Reviewed: I have reviewed patient's labs as well as cytology report which is pending, I have also reviewed patient's CBC as well as BMP results and vitals as noted below    Latest Ref Rng & Units 03/16/2023    3:41 AM 03/15/2023    4:56 AM 03/14/2023    4:27 AM  CBC  WBC 4.0 - 10.5 K/uL 6.1  8.7  6.8   Hemoglobin 12.0 - 15.0 g/dL 8.0  7.7  7.5   Hematocrit 36.0 - 46.0 % 24.6  24.1  23.7   Platelets 150 - 400 K/uL 260  261  258        Latest Ref Rng & Units 03/16/2023    3:41 AM 03/15/2023    4:56 AM 03/14/2023    4:27 AM  BMP  Glucose 70 - 99 mg/dL 161  87  71   BUN 6 - 20 mg/dL 9  12  13    Creatinine 0.44 - 1.00  mg/dL 0.96  0.45  4.09   Sodium 135 - 145 mmol/L 140  140  139   Potassium 3.5 - 5.1 mmol/L 3.3  3.6  3.5   Chloride 98 - 111 mmol/L 102  103  100   CO2 22 - 32 mmol/L 29  28  30    Calcium 8.9 - 10.3 mg/dL 9.1  8.9  8.9      Vitals:   03/16/23 0723 03/16/23 0820 03/16/23 1343 03/16/23 1536  BP:  (!) 144/82  (!) 144/70  Pulse:  68  68  Resp:  16  16  Temp:  98.1 F (36.7 C)  98 F (36.7 C)  TempSrc:      SpO2: 94% 92% 94% 94%  Weight:      Height:         Author: Loyce Dys, MD 03/16/2023 5:47 PM  For on call review [email or web address].

## 2023-03-16 NOTE — Plan of Care (Signed)
  Problem: Skin Integrity: Goal: Will show signs of wound healing Outcome: Progressing   Problem: Pain Management: Goal: Pain level will decrease Outcome: Progressing   Problem: Health Behavior/Discharge Planning: Goal: Identification of resources available to assist in meeting health care needs will improve Outcome: Progressing   Problem: Elimination: Goal: Will not experience complications related to bowel motility Outcome: Progressing   Problem: Safety: Goal: Ability to remain free from injury will improve Outcome: Progressing

## 2023-03-16 NOTE — Progress Notes (Unsigned)
Peacock M.D.   On: 03/09/2023 10:15   ECHOCARDIOGRAM COMPLETE  Result Date: 03/08/2023    ECHOCARDIOGRAM REPORT   Patient Name:   Paula Garrett Date of Exam: 03/08/2023 Medical Rec #:  [id number]      Height:       62.0 in Accession #:    [id number]     Weight:       204.4 lb Date of Birth:  1965-11-08      BSA:          1.929 m Patient Age:    57 years       BP:           121/65 mmHg Patient Gender: F              HR:           63 bpm. Exam Location:  [facility] Procedure: 2D Echo Indications:     Pleural effusion  History:         Patient has prior history of Echocardiogram examinations, most                  recent 01/31/2023.  Sonographer:     Overton Mam RDCS, FASE Referring Phys:  [id number] Alphonsus Sias GRIFFITH Diagnosing Phys: Lennie Odor MD IMPRESSIONS  1. Left ventricular ejection fraction, by estimation, is 60 to 65%. The left  ventricle has normal function. The left ventricle has no regional wall motion abnormalities. Left ventricular diastolic parameters were normal.  2. Right ventricular systolic function is normal. The right ventricular size is normal. There is normal pulmonary artery systolic pressure. The estimated right ventricular systolic pressure is 31.8 mmHg.  3. The mitral valve is grossly normal. Trivial mitral valve regurgitation.  4. The aortic valve is tricuspid. Aortic valve regurgitation is not visualized. No aortic stenosis is present.  5. The inferior vena cava is dilated in size with >50% respiratory variability, suggesting right atrial pressure of 8 mmHg. FINDINGS  Left Ventricle: Left ventricular ejection fraction, by estimation, is 60 to 65%. The left ventricle has normal function. The left ventricle has no regional wall motion abnormalities. The left ventricular internal cavity size was normal in size. There is  no left ventricular hypertrophy. Left ventricular diastolic parameters were normal. Right Ventricle: The right ventricular size is normal. No increase in right ventricular wall thickness. Right ventricular systolic function is normal. There is normal pulmonary artery systolic pressure. The tricuspid regurgitant velocity is 2.44 m/s, and  with an assumed right atrial pressure of 8 mmHg, the estimated right ventricular systolic pressure is 31.8 mmHg. Left Atrium: Left atrial size was normal in size. Right Atrium: Right atrial size was normal in size. Pericardium: There is no evidence of pericardial effusion. Mitral Valve: The mitral valve is grossly normal. Trivial mitral valve regurgitation. Tricuspid Valve: The tricuspid valve is grossly normal. Tricuspid valve regurgitation is trivial. No evidence of tricuspid stenosis. Aortic Valve: The aortic valve is tricuspid. Aortic valve regurgitation is not visualized. No aortic stenosis is present. Aortic valve peak gradient measures 9.2 mmHg. Pulmonic Valve: The  pulmonic valve was grossly normal. Pulmonic valve regurgitation is trivial. No evidence of pulmonic stenosis. Aorta: The aortic root and ascending aorta are structurally normal, with no evidence of dilitation. Venous: The inferior vena cava is dilated in size with greater than 50% respiratory variability, suggesting right atrial pressure of 8 mmHg. IAS/Shunts: The atrial septum is grossly normal.  LEFT VENTRICLE PLAX 2D LVIDd:  Peacock M.D.   On: 03/09/2023 10:15   ECHOCARDIOGRAM COMPLETE  Result Date: 03/08/2023    ECHOCARDIOGRAM REPORT   Patient Name:   Paula Garrett Date of Exam: 03/08/2023 Medical Rec #:  [id number]      Height:       62.0 in Accession #:    [id number]     Weight:       204.4 lb Date of Birth:  1965-11-08      BSA:          1.929 m Patient Age:    57 years       BP:           121/65 mmHg Patient Gender: F              HR:           63 bpm. Exam Location:  [facility] Procedure: 2D Echo Indications:     Pleural effusion  History:         Patient has prior history of Echocardiogram examinations, most                  recent 01/31/2023.  Sonographer:     Overton Mam RDCS, FASE Referring Phys:  [id number] Alphonsus Sias GRIFFITH Diagnosing Phys: Lennie Odor MD IMPRESSIONS  1. Left ventricular ejection fraction, by estimation, is 60 to 65%. The left  ventricle has normal function. The left ventricle has no regional wall motion abnormalities. Left ventricular diastolic parameters were normal.  2. Right ventricular systolic function is normal. The right ventricular size is normal. There is normal pulmonary artery systolic pressure. The estimated right ventricular systolic pressure is 31.8 mmHg.  3. The mitral valve is grossly normal. Trivial mitral valve regurgitation.  4. The aortic valve is tricuspid. Aortic valve regurgitation is not visualized. No aortic stenosis is present.  5. The inferior vena cava is dilated in size with >50% respiratory variability, suggesting right atrial pressure of 8 mmHg. FINDINGS  Left Ventricle: Left ventricular ejection fraction, by estimation, is 60 to 65%. The left ventricle has normal function. The left ventricle has no regional wall motion abnormalities. The left ventricular internal cavity size was normal in size. There is  no left ventricular hypertrophy. Left ventricular diastolic parameters were normal. Right Ventricle: The right ventricular size is normal. No increase in right ventricular wall thickness. Right ventricular systolic function is normal. There is normal pulmonary artery systolic pressure. The tricuspid regurgitant velocity is 2.44 m/s, and  with an assumed right atrial pressure of 8 mmHg, the estimated right ventricular systolic pressure is 31.8 mmHg. Left Atrium: Left atrial size was normal in size. Right Atrium: Right atrial size was normal in size. Pericardium: There is no evidence of pericardial effusion. Mitral Valve: The mitral valve is grossly normal. Trivial mitral valve regurgitation. Tricuspid Valve: The tricuspid valve is grossly normal. Tricuspid valve regurgitation is trivial. No evidence of tricuspid stenosis. Aortic Valve: The aortic valve is tricuspid. Aortic valve regurgitation is not visualized. No aortic stenosis is present. Aortic valve peak gradient measures 9.2 mmHg. Pulmonic Valve: The  pulmonic valve was grossly normal. Pulmonic valve regurgitation is trivial. No evidence of pulmonic stenosis. Aorta: The aortic root and ascending aorta are structurally normal, with no evidence of dilitation. Venous: The inferior vena cava is dilated in size with greater than 50% respiratory variability, suggesting right atrial pressure of 8 mmHg. IAS/Shunts: The atrial septum is grossly normal.  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  She was last seen by me in April 2024 and at that time she had complained of mild left chest wall pain radiating to her back.  She underwent cardiac workup and I had done a bone survey at that time which did not show any evidence of bone disease.  Her smoldering multiple myeloma at that time was stable.  Subsequently she has had chest x-ray in May 2024 as well as a rib x-ray in July 2024.  None of these showed any abnormalities.  Her pain has gradually worsened in the last 2 months but especially so in the last 1 week.  She presented to the ER with symptoms of pleuritic chest wall pain and shortness of breath and underwent a CT angio chest which showed a soft tissue mass with bone destruction involving the left seventh rib as well as T6 and T7 vertebral bodies and central canal.  Possibility of cord compression.  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   Results of myeloma work-up from 06/20/2020 showed an  elevated IgG of 3668.  M protein was elevated at 3.1 and IFE showed IgG monoclonal lambda protein.  Beta-2 microglobulin and LDH were normal.  Serum free light chain ratio was elevated at 25 with a free light chain lambda of 303.  Bone marrow biopsy showed increased plasma cells 22% by manual count and 20% by IHC staining for CD138.  Cytogenetics for myeloma showed gain of 1 q.  PET CT scan was not approved by insurance.  MRI cervical and lumbar thoracic spine did not show any evidence of lytic lesions.  Bone survey was negative for bone lesions as well.  MRI pelvis with and without contrast showed diffuse patchy heterogeneous marrow concerning for myeloma    Patient had a repeat bone marrow biopsy inAugust 2024 which showed 20% overall plasma cells and hypercellular marrow consistent with plasma cell myeloma.  Cytogenetics and FISH studies for multiple myeloma pending.  PET scan pending.  Patient had stabilization procedure/spinal fusion T4-T9 with T6-T7 laminotomies.  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  However patient was found to be in acute kidney failure with a creatinine of 5 in August 2024 and was sent to the ER.  She received cycle 1 of CyBorD in the hospital with improvement in her creatinine with stabilized between 1.5-1.7.  Treatment has been complicated by repeated hospitalizations for acute hypoxic respiratory failure and left pleural effusion which has been treated with antibiotics in the past.  Interval history-patient does not  Peacock M.D.   On: 03/09/2023 10:15   ECHOCARDIOGRAM COMPLETE  Result Date: 03/08/2023    ECHOCARDIOGRAM REPORT   Patient Name:   Paula Garrett Date of Exam: 03/08/2023 Medical Rec #:  [id number]      Height:       62.0 in Accession #:    [id number]     Weight:       204.4 lb Date of Birth:  1965-11-08      BSA:          1.929 m Patient Age:    57 years       BP:           121/65 mmHg Patient Gender: F              HR:           63 bpm. Exam Location:  [facility] Procedure: 2D Echo Indications:     Pleural effusion  History:         Patient has prior history of Echocardiogram examinations, most                  recent 01/31/2023.  Sonographer:     Overton Mam RDCS, FASE Referring Phys:  [id number] Alphonsus Sias GRIFFITH Diagnosing Phys: Lennie Odor MD IMPRESSIONS  1. Left ventricular ejection fraction, by estimation, is 60 to 65%. The left  ventricle has normal function. The left ventricle has no regional wall motion abnormalities. Left ventricular diastolic parameters were normal.  2. Right ventricular systolic function is normal. The right ventricular size is normal. There is normal pulmonary artery systolic pressure. The estimated right ventricular systolic pressure is 31.8 mmHg.  3. The mitral valve is grossly normal. Trivial mitral valve regurgitation.  4. The aortic valve is tricuspid. Aortic valve regurgitation is not visualized. No aortic stenosis is present.  5. The inferior vena cava is dilated in size with >50% respiratory variability, suggesting right atrial pressure of 8 mmHg. FINDINGS  Left Ventricle: Left ventricular ejection fraction, by estimation, is 60 to 65%. The left ventricle has normal function. The left ventricle has no regional wall motion abnormalities. The left ventricular internal cavity size was normal in size. There is  no left ventricular hypertrophy. Left ventricular diastolic parameters were normal. Right Ventricle: The right ventricular size is normal. No increase in right ventricular wall thickness. Right ventricular systolic function is normal. There is normal pulmonary artery systolic pressure. The tricuspid regurgitant velocity is 2.44 m/s, and  with an assumed right atrial pressure of 8 mmHg, the estimated right ventricular systolic pressure is 31.8 mmHg. Left Atrium: Left atrial size was normal in size. Right Atrium: Right atrial size was normal in size. Pericardium: There is no evidence of pericardial effusion. Mitral Valve: The mitral valve is grossly normal. Trivial mitral valve regurgitation. Tricuspid Valve: The tricuspid valve is grossly normal. Tricuspid valve regurgitation is trivial. No evidence of tricuspid stenosis. Aortic Valve: The aortic valve is tricuspid. Aortic valve regurgitation is not visualized. No aortic stenosis is present. Aortic valve peak gradient measures 9.2 mmHg. Pulmonic Valve: The  pulmonic valve was grossly normal. Pulmonic valve regurgitation is trivial. No evidence of pulmonic stenosis. Aorta: The aortic root and ascending aorta are structurally normal, with no evidence of dilitation. Venous: The inferior vena cava is dilated in size with greater than 50% respiratory variability, suggesting right atrial pressure of 8 mmHg. IAS/Shunts: The atrial septum is grossly normal.  LEFT VENTRICLE PLAX 2D LVIDd:  Peacock M.D.   On: 03/09/2023 10:15   ECHOCARDIOGRAM COMPLETE  Result Date: 03/08/2023    ECHOCARDIOGRAM REPORT   Patient Name:   Paula Garrett Date of Exam: 03/08/2023 Medical Rec #:  [id number]      Height:       62.0 in Accession #:    [id number]     Weight:       204.4 lb Date of Birth:  1965-11-08      BSA:          1.929 m Patient Age:    57 years       BP:           121/65 mmHg Patient Gender: F              HR:           63 bpm. Exam Location:  [facility] Procedure: 2D Echo Indications:     Pleural effusion  History:         Patient has prior history of Echocardiogram examinations, most                  recent 01/31/2023.  Sonographer:     Overton Mam RDCS, FASE Referring Phys:  [id number] Alphonsus Sias GRIFFITH Diagnosing Phys: Lennie Odor MD IMPRESSIONS  1. Left ventricular ejection fraction, by estimation, is 60 to 65%. The left  ventricle has normal function. The left ventricle has no regional wall motion abnormalities. Left ventricular diastolic parameters were normal.  2. Right ventricular systolic function is normal. The right ventricular size is normal. There is normal pulmonary artery systolic pressure. The estimated right ventricular systolic pressure is 31.8 mmHg.  3. The mitral valve is grossly normal. Trivial mitral valve regurgitation.  4. The aortic valve is tricuspid. Aortic valve regurgitation is not visualized. No aortic stenosis is present.  5. The inferior vena cava is dilated in size with >50% respiratory variability, suggesting right atrial pressure of 8 mmHg. FINDINGS  Left Ventricle: Left ventricular ejection fraction, by estimation, is 60 to 65%. The left ventricle has normal function. The left ventricle has no regional wall motion abnormalities. The left ventricular internal cavity size was normal in size. There is  no left ventricular hypertrophy. Left ventricular diastolic parameters were normal. Right Ventricle: The right ventricular size is normal. No increase in right ventricular wall thickness. Right ventricular systolic function is normal. There is normal pulmonary artery systolic pressure. The tricuspid regurgitant velocity is 2.44 m/s, and  with an assumed right atrial pressure of 8 mmHg, the estimated right ventricular systolic pressure is 31.8 mmHg. Left Atrium: Left atrial size was normal in size. Right Atrium: Right atrial size was normal in size. Pericardium: There is no evidence of pericardial effusion. Mitral Valve: The mitral valve is grossly normal. Trivial mitral valve regurgitation. Tricuspid Valve: The tricuspid valve is grossly normal. Tricuspid valve regurgitation is trivial. No evidence of tricuspid stenosis. Aortic Valve: The aortic valve is tricuspid. Aortic valve regurgitation is not visualized. No aortic stenosis is present. Aortic valve peak gradient measures 9.2 mmHg. Pulmonic Valve: The  pulmonic valve was grossly normal. Pulmonic valve regurgitation is trivial. No evidence of pulmonic stenosis. Aorta: The aortic root and ascending aorta are structurally normal, with no evidence of dilitation. Venous: The inferior vena cava is dilated in size with greater than 50% respiratory variability, suggesting right atrial pressure of 8 mmHg. IAS/Shunts: The atrial septum is grossly normal.  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  She was last seen by me in April 2024 and at that time she had complained of mild left chest wall pain radiating to her back.  She underwent cardiac workup and I had done a bone survey at that time which did not show any evidence of bone disease.  Her smoldering multiple myeloma at that time was stable.  Subsequently she has had chest x-ray in May 2024 as well as a rib x-ray in July 2024.  None of these showed any abnormalities.  Her pain has gradually worsened in the last 2 months but especially so in the last 1 week.  She presented to the ER with symptoms of pleuritic chest wall pain and shortness of breath and underwent a CT angio chest which showed a soft tissue mass with bone destruction involving the left seventh rib as well as T6 and T7 vertebral bodies and central canal.  Possibility of cord compression.  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   Results of myeloma work-up from 06/20/2020 showed an  elevated IgG of 3668.  M protein was elevated at 3.1 and IFE showed IgG monoclonal lambda protein.  Beta-2 microglobulin and LDH were normal.  Serum free light chain ratio was elevated at 25 with a free light chain lambda of 303.  Bone marrow biopsy showed increased plasma cells 22% by manual count and 20% by IHC staining for CD138.  Cytogenetics for myeloma showed gain of 1 q.  PET CT scan was not approved by insurance.  MRI cervical and lumbar thoracic spine did not show any evidence of lytic lesions.  Bone survey was negative for bone lesions as well.  MRI pelvis with and without contrast showed diffuse patchy heterogeneous marrow concerning for myeloma    Patient had a repeat bone marrow biopsy inAugust 2024 which showed 20% overall plasma cells and hypercellular marrow consistent with plasma cell myeloma.  Cytogenetics and FISH studies for multiple myeloma pending.  PET scan pending.  Patient had stabilization procedure/spinal fusion T4-T9 with T6-T7 laminotomies.  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  However patient was found to be in acute kidney failure with a creatinine of 5 in August 2024 and was sent to the ER.  She received cycle 1 of CyBorD in the hospital with improvement in her creatinine with stabilized between 1.5-1.7.  Treatment has been complicated by repeated hospitalizations for acute hypoxic respiratory failure and left pleural effusion which has been treated with antibiotics in the past.  Interval history-patient does not  Peacock M.D.   On: 03/09/2023 10:15   ECHOCARDIOGRAM COMPLETE  Result Date: 03/08/2023    ECHOCARDIOGRAM REPORT   Patient Name:   Paula Garrett Date of Exam: 03/08/2023 Medical Rec #:  [id number]      Height:       62.0 in Accession #:    [id number]     Weight:       204.4 lb Date of Birth:  1965-11-08      BSA:          1.929 m Patient Age:    57 years       BP:           121/65 mmHg Patient Gender: F              HR:           63 bpm. Exam Location:  [facility] Procedure: 2D Echo Indications:     Pleural effusion  History:         Patient has prior history of Echocardiogram examinations, most                  recent 01/31/2023.  Sonographer:     Overton Mam RDCS, FASE Referring Phys:  [id number] Alphonsus Sias GRIFFITH Diagnosing Phys: Lennie Odor MD IMPRESSIONS  1. Left ventricular ejection fraction, by estimation, is 60 to 65%. The left  ventricle has normal function. The left ventricle has no regional wall motion abnormalities. Left ventricular diastolic parameters were normal.  2. Right ventricular systolic function is normal. The right ventricular size is normal. There is normal pulmonary artery systolic pressure. The estimated right ventricular systolic pressure is 31.8 mmHg.  3. The mitral valve is grossly normal. Trivial mitral valve regurgitation.  4. The aortic valve is tricuspid. Aortic valve regurgitation is not visualized. No aortic stenosis is present.  5. The inferior vena cava is dilated in size with >50% respiratory variability, suggesting right atrial pressure of 8 mmHg. FINDINGS  Left Ventricle: Left ventricular ejection fraction, by estimation, is 60 to 65%. The left ventricle has normal function. The left ventricle has no regional wall motion abnormalities. The left ventricular internal cavity size was normal in size. There is  no left ventricular hypertrophy. Left ventricular diastolic parameters were normal. Right Ventricle: The right ventricular size is normal. No increase in right ventricular wall thickness. Right ventricular systolic function is normal. There is normal pulmonary artery systolic pressure. The tricuspid regurgitant velocity is 2.44 m/s, and  with an assumed right atrial pressure of 8 mmHg, the estimated right ventricular systolic pressure is 31.8 mmHg. Left Atrium: Left atrial size was normal in size. Right Atrium: Right atrial size was normal in size. Pericardium: There is no evidence of pericardial effusion. Mitral Valve: The mitral valve is grossly normal. Trivial mitral valve regurgitation. Tricuspid Valve: The tricuspid valve is grossly normal. Tricuspid valve regurgitation is trivial. No evidence of tricuspid stenosis. Aortic Valve: The aortic valve is tricuspid. Aortic valve regurgitation is not visualized. No aortic stenosis is present. Aortic valve peak gradient measures 9.2 mmHg. Pulmonic Valve: The  pulmonic valve was grossly normal. Pulmonic valve regurgitation is trivial. No evidence of pulmonic stenosis. Aorta: The aortic root and ascending aorta are structurally normal, with no evidence of dilitation. Venous: The inferior vena cava is dilated in size with greater than 50% respiratory variability, suggesting right atrial pressure of 8 mmHg. IAS/Shunts: The atrial septum is grossly normal.  LEFT VENTRICLE PLAX 2D LVIDd:  Peacock M.D.   On: 03/09/2023 10:15   ECHOCARDIOGRAM COMPLETE  Result Date: 03/08/2023    ECHOCARDIOGRAM REPORT   Patient Name:   Paula Garrett Date of Exam: 03/08/2023 Medical Rec #:  [id number]      Height:       62.0 in Accession #:    [id number]     Weight:       204.4 lb Date of Birth:  1965-11-08      BSA:          1.929 m Patient Age:    57 years       BP:           121/65 mmHg Patient Gender: F              HR:           63 bpm. Exam Location:  [facility] Procedure: 2D Echo Indications:     Pleural effusion  History:         Patient has prior history of Echocardiogram examinations, most                  recent 01/31/2023.  Sonographer:     Overton Mam RDCS, FASE Referring Phys:  [id number] Alphonsus Sias GRIFFITH Diagnosing Phys: Lennie Odor MD IMPRESSIONS  1. Left ventricular ejection fraction, by estimation, is 60 to 65%. The left  ventricle has normal function. The left ventricle has no regional wall motion abnormalities. Left ventricular diastolic parameters were normal.  2. Right ventricular systolic function is normal. The right ventricular size is normal. There is normal pulmonary artery systolic pressure. The estimated right ventricular systolic pressure is 31.8 mmHg.  3. The mitral valve is grossly normal. Trivial mitral valve regurgitation.  4. The aortic valve is tricuspid. Aortic valve regurgitation is not visualized. No aortic stenosis is present.  5. The inferior vena cava is dilated in size with >50% respiratory variability, suggesting right atrial pressure of 8 mmHg. FINDINGS  Left Ventricle: Left ventricular ejection fraction, by estimation, is 60 to 65%. The left ventricle has normal function. The left ventricle has no regional wall motion abnormalities. The left ventricular internal cavity size was normal in size. There is  no left ventricular hypertrophy. Left ventricular diastolic parameters were normal. Right Ventricle: The right ventricular size is normal. No increase in right ventricular wall thickness. Right ventricular systolic function is normal. There is normal pulmonary artery systolic pressure. The tricuspid regurgitant velocity is 2.44 m/s, and  with an assumed right atrial pressure of 8 mmHg, the estimated right ventricular systolic pressure is 31.8 mmHg. Left Atrium: Left atrial size was normal in size. Right Atrium: Right atrial size was normal in size. Pericardium: There is no evidence of pericardial effusion. Mitral Valve: The mitral valve is grossly normal. Trivial mitral valve regurgitation. Tricuspid Valve: The tricuspid valve is grossly normal. Tricuspid valve regurgitation is trivial. No evidence of tricuspid stenosis. Aortic Valve: The aortic valve is tricuspid. Aortic valve regurgitation is not visualized. No aortic stenosis is present. Aortic valve peak gradient measures 9.2 mmHg. Pulmonic Valve: The  pulmonic valve was grossly normal. Pulmonic valve regurgitation is trivial. No evidence of pulmonic stenosis. Aorta: The aortic root and ascending aorta are structurally normal, with no evidence of dilitation. Venous: The inferior vena cava is dilated in size with greater than 50% respiratory variability, suggesting right atrial pressure of 8 mmHg. IAS/Shunts: The atrial septum is grossly normal.  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  She was last seen by me in April 2024 and at that time she had complained of mild left chest wall pain radiating to her back.  She underwent cardiac workup and I had done a bone survey at that time which did not show any evidence of bone disease.  Her smoldering multiple myeloma at that time was stable.  Subsequently she has had chest x-ray in May 2024 as well as a rib x-ray in July 2024.  None of these showed any abnormalities.  Her pain has gradually worsened in the last 2 months but especially so in the last 1 week.  She presented to the ER with symptoms of pleuritic chest wall pain and shortness of breath and underwent a CT angio chest which showed a soft tissue mass with bone destruction involving the left seventh rib as well as T6 and T7 vertebral bodies and central canal.  Possibility of cord compression.  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   Results of myeloma work-up from 06/20/2020 showed an  elevated IgG of 3668.  M protein was elevated at 3.1 and IFE showed IgG monoclonal lambda protein.  Beta-2 microglobulin and LDH were normal.  Serum free light chain ratio was elevated at 25 with a free light chain lambda of 303.  Bone marrow biopsy showed increased plasma cells 22% by manual count and 20% by IHC staining for CD138.  Cytogenetics for myeloma showed gain of 1 q.  PET CT scan was not approved by insurance.  MRI cervical and lumbar thoracic spine did not show any evidence of lytic lesions.  Bone survey was negative for bone lesions as well.  MRI pelvis with and without contrast showed diffuse patchy heterogeneous marrow concerning for myeloma    Patient had a repeat bone marrow biopsy inAugust 2024 which showed 20% overall plasma cells and hypercellular marrow consistent with plasma cell myeloma.  Cytogenetics and FISH studies for multiple myeloma pending.  PET scan pending.  Patient had stabilization procedure/spinal fusion T4-T9 with T6-T7 laminotomies.  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  However patient was found to be in acute kidney failure with a creatinine of 5 in August 2024 and was sent to the ER.  She received cycle 1 of CyBorD in the hospital with improvement in her creatinine with stabilized between 1.5-1.7.  Treatment has been complicated by repeated hospitalizations for acute hypoxic respiratory failure and left pleural effusion which has been treated with antibiotics in the past.  Interval history-patient does not  Peacock M.D.   On: 03/09/2023 10:15   ECHOCARDIOGRAM COMPLETE  Result Date: 03/08/2023    ECHOCARDIOGRAM REPORT   Patient Name:   Paula Garrett Date of Exam: 03/08/2023 Medical Rec #:  [id number]      Height:       62.0 in Accession #:    [id number]     Weight:       204.4 lb Date of Birth:  1965-11-08      BSA:          1.929 m Patient Age:    57 years       BP:           121/65 mmHg Patient Gender: F              HR:           63 bpm. Exam Location:  [facility] Procedure: 2D Echo Indications:     Pleural effusion  History:         Patient has prior history of Echocardiogram examinations, most                  recent 01/31/2023.  Sonographer:     Overton Mam RDCS, FASE Referring Phys:  [id number] Alphonsus Sias GRIFFITH Diagnosing Phys: Lennie Odor MD IMPRESSIONS  1. Left ventricular ejection fraction, by estimation, is 60 to 65%. The left  ventricle has normal function. The left ventricle has no regional wall motion abnormalities. Left ventricular diastolic parameters were normal.  2. Right ventricular systolic function is normal. The right ventricular size is normal. There is normal pulmonary artery systolic pressure. The estimated right ventricular systolic pressure is 31.8 mmHg.  3. The mitral valve is grossly normal. Trivial mitral valve regurgitation.  4. The aortic valve is tricuspid. Aortic valve regurgitation is not visualized. No aortic stenosis is present.  5. The inferior vena cava is dilated in size with >50% respiratory variability, suggesting right atrial pressure of 8 mmHg. FINDINGS  Left Ventricle: Left ventricular ejection fraction, by estimation, is 60 to 65%. The left ventricle has normal function. The left ventricle has no regional wall motion abnormalities. The left ventricular internal cavity size was normal in size. There is  no left ventricular hypertrophy. Left ventricular diastolic parameters were normal. Right Ventricle: The right ventricular size is normal. No increase in right ventricular wall thickness. Right ventricular systolic function is normal. There is normal pulmonary artery systolic pressure. The tricuspid regurgitant velocity is 2.44 m/s, and  with an assumed right atrial pressure of 8 mmHg, the estimated right ventricular systolic pressure is 31.8 mmHg. Left Atrium: Left atrial size was normal in size. Right Atrium: Right atrial size was normal in size. Pericardium: There is no evidence of pericardial effusion. Mitral Valve: The mitral valve is grossly normal. Trivial mitral valve regurgitation. Tricuspid Valve: The tricuspid valve is grossly normal. Tricuspid valve regurgitation is trivial. No evidence of tricuspid stenosis. Aortic Valve: The aortic valve is tricuspid. Aortic valve regurgitation is not visualized. No aortic stenosis is present. Aortic valve peak gradient measures 9.2 mmHg. Pulmonic Valve: The  pulmonic valve was grossly normal. Pulmonic valve regurgitation is trivial. No evidence of pulmonic stenosis. Aorta: The aortic root and ascending aorta are structurally normal, with no evidence of dilitation. Venous: The inferior vena cava is dilated in size with greater than 50% respiratory variability, suggesting right atrial pressure of 8 mmHg. IAS/Shunts: The atrial septum is grossly normal.  LEFT VENTRICLE PLAX 2D LVIDd:  Peacock M.D.   On: 03/09/2023 10:15   ECHOCARDIOGRAM COMPLETE  Result Date: 03/08/2023    ECHOCARDIOGRAM REPORT   Patient Name:   Paula Garrett Date of Exam: 03/08/2023 Medical Rec #:  [id number]      Height:       62.0 in Accession #:    [id number]     Weight:       204.4 lb Date of Birth:  1965-11-08      BSA:          1.929 m Patient Age:    57 years       BP:           121/65 mmHg Patient Gender: F              HR:           63 bpm. Exam Location:  [facility] Procedure: 2D Echo Indications:     Pleural effusion  History:         Patient has prior history of Echocardiogram examinations, most                  recent 01/31/2023.  Sonographer:     Overton Mam RDCS, FASE Referring Phys:  [id number] Alphonsus Sias GRIFFITH Diagnosing Phys: Lennie Odor MD IMPRESSIONS  1. Left ventricular ejection fraction, by estimation, is 60 to 65%. The left  ventricle has normal function. The left ventricle has no regional wall motion abnormalities. Left ventricular diastolic parameters were normal.  2. Right ventricular systolic function is normal. The right ventricular size is normal. There is normal pulmonary artery systolic pressure. The estimated right ventricular systolic pressure is 31.8 mmHg.  3. The mitral valve is grossly normal. Trivial mitral valve regurgitation.  4. The aortic valve is tricuspid. Aortic valve regurgitation is not visualized. No aortic stenosis is present.  5. The inferior vena cava is dilated in size with >50% respiratory variability, suggesting right atrial pressure of 8 mmHg. FINDINGS  Left Ventricle: Left ventricular ejection fraction, by estimation, is 60 to 65%. The left ventricle has normal function. The left ventricle has no regional wall motion abnormalities. The left ventricular internal cavity size was normal in size. There is  no left ventricular hypertrophy. Left ventricular diastolic parameters were normal. Right Ventricle: The right ventricular size is normal. No increase in right ventricular wall thickness. Right ventricular systolic function is normal. There is normal pulmonary artery systolic pressure. The tricuspid regurgitant velocity is 2.44 m/s, and  with an assumed right atrial pressure of 8 mmHg, the estimated right ventricular systolic pressure is 31.8 mmHg. Left Atrium: Left atrial size was normal in size. Right Atrium: Right atrial size was normal in size. Pericardium: There is no evidence of pericardial effusion. Mitral Valve: The mitral valve is grossly normal. Trivial mitral valve regurgitation. Tricuspid Valve: The tricuspid valve is grossly normal. Tricuspid valve regurgitation is trivial. No evidence of tricuspid stenosis. Aortic Valve: The aortic valve is tricuspid. Aortic valve regurgitation is not visualized. No aortic stenosis is present. Aortic valve peak gradient measures 9.2 mmHg. Pulmonic Valve: The  pulmonic valve was grossly normal. Pulmonic valve regurgitation is trivial. No evidence of pulmonic stenosis. Aorta: The aortic root and ascending aorta are structurally normal, with no evidence of dilitation. Venous: The inferior vena cava is dilated in size with greater than 50% respiratory variability, suggesting right atrial pressure of 8 mmHg. IAS/Shunts: The atrial septum is grossly normal.  LEFT VENTRICLE PLAX 2D LVIDd:  Peacock M.D.   On: 03/09/2023 10:15   ECHOCARDIOGRAM COMPLETE  Result Date: 03/08/2023    ECHOCARDIOGRAM REPORT   Patient Name:   Paula Garrett Date of Exam: 03/08/2023 Medical Rec #:  [id number]      Height:       62.0 in Accession #:    [id number]     Weight:       204.4 lb Date of Birth:  1965-11-08      BSA:          1.929 m Patient Age:    57 years       BP:           121/65 mmHg Patient Gender: F              HR:           63 bpm. Exam Location:  [facility] Procedure: 2D Echo Indications:     Pleural effusion  History:         Patient has prior history of Echocardiogram examinations, most                  recent 01/31/2023.  Sonographer:     Overton Mam RDCS, FASE Referring Phys:  [id number] Alphonsus Sias GRIFFITH Diagnosing Phys: Lennie Odor MD IMPRESSIONS  1. Left ventricular ejection fraction, by estimation, is 60 to 65%. The left  ventricle has normal function. The left ventricle has no regional wall motion abnormalities. Left ventricular diastolic parameters were normal.  2. Right ventricular systolic function is normal. The right ventricular size is normal. There is normal pulmonary artery systolic pressure. The estimated right ventricular systolic pressure is 31.8 mmHg.  3. The mitral valve is grossly normal. Trivial mitral valve regurgitation.  4. The aortic valve is tricuspid. Aortic valve regurgitation is not visualized. No aortic stenosis is present.  5. The inferior vena cava is dilated in size with >50% respiratory variability, suggesting right atrial pressure of 8 mmHg. FINDINGS  Left Ventricle: Left ventricular ejection fraction, by estimation, is 60 to 65%. The left ventricle has normal function. The left ventricle has no regional wall motion abnormalities. The left ventricular internal cavity size was normal in size. There is  no left ventricular hypertrophy. Left ventricular diastolic parameters were normal. Right Ventricle: The right ventricular size is normal. No increase in right ventricular wall thickness. Right ventricular systolic function is normal. There is normal pulmonary artery systolic pressure. The tricuspid regurgitant velocity is 2.44 m/s, and  with an assumed right atrial pressure of 8 mmHg, the estimated right ventricular systolic pressure is 31.8 mmHg. Left Atrium: Left atrial size was normal in size. Right Atrium: Right atrial size was normal in size. Pericardium: There is no evidence of pericardial effusion. Mitral Valve: The mitral valve is grossly normal. Trivial mitral valve regurgitation. Tricuspid Valve: The tricuspid valve is grossly normal. Tricuspid valve regurgitation is trivial. No evidence of tricuspid stenosis. Aortic Valve: The aortic valve is tricuspid. Aortic valve regurgitation is not visualized. No aortic stenosis is present. Aortic valve peak gradient measures 9.2 mmHg. Pulmonic Valve: The  pulmonic valve was grossly normal. Pulmonic valve regurgitation is trivial. No evidence of pulmonic stenosis. Aorta: The aortic root and ascending aorta are structurally normal, with no evidence of dilitation. Venous: The inferior vena cava is dilated in size with greater than 50% respiratory variability, suggesting right atrial pressure of 8 mmHg. IAS/Shunts: The atrial septum is grossly normal.  LEFT VENTRICLE PLAX 2D LVIDd:

## 2023-03-16 NOTE — Progress Notes (Signed)
Mobility Specialist - Progress Note   03/16/23 1236  Mobility  Activity Ambulated independently in hallway;Stood at bedside;Dangled on edge of bed  Level of Assistance Independent  Assistive Device None  Distance Ambulated (ft) 180 ft  Activity Response Tolerated well  Mobility Referral Yes  $Mobility charge 1 Mobility  Mobility Specialist Start Time (ACUTE ONLY) [id number]  Mobility Specialist Stop Time (ACUTE ONLY) 0949  Mobility Specialist Time Calculation (min) (ACUTE ONLY) 10 min   Pt supine in bed on RA upon arrival. Pt completes bed mobility, STS, and ambulates in hallway indep with no LOB noted. Pt desats to 75 Spo2 after return to room, Spo2 returns to normal with rest, RN notified. Pt left EOB with needs in reach.   Terrilyn Saver  Mobility Specialist  03/16/23 12:38 PM

## 2023-03-16 NOTE — Evaluation (Signed)
Physical Therapy Evaluation Patient Details Name: Paula Garrett MRN: [id number] DOB: 1966-05-07 Today's Date: 03/16/2023  History of Present Illness  Pt is a 57 y.o. female [facility] concerns of SOB.  Pt admitted with recurrent L pleural effusion, COPD with acute exacerbation, acute on chronic respiratory failure with hypoxemia.  Chest tube L placed 9/22 and removed 03/13/23. 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  Clinical Impression  Prior to hospital admission, pt was independent with functional mobility; lives alone in 1 level home with level entry.  5/10 LBP reported during session (nurse notified--pt not due for pain meds yet).  Currently pt is modified independent with bed mobility, independent with transfers, and independent with ambulation 200 feet (no AD use).  Pt requesting to toilet prior to walking but declined her supplemental O2 and took off her nasal cannula (pt reports she has not been using supplemental O2 when going to the bathroom); SpO2 sats noted to be 76% on room air post toileting and pt appearing SOB.  3 L supplemental O2 via nasal cannula donned to recover and for ambulation.  Pt's SpO2 sats (on 3 L) initially 90% with ambulation but decreased to 88% after 140 feet of ambulation.  Supplemental O2 increased to 4 L for rest of ambulation (pt's SpO2 sats 89% after last 60 feet of walking).  Pt titrated back to 2 L and pt's SpO2 sats 92% at rest end of session on 2 L O2 via nasal cannula.  Pt educated on importance of supplemental O2 use.  Pt's nurse updated on pt's status, SpO2 sats during session, and supplemental O2 needs. Pt appears to be ambulating well and pt reports using supplemental O2 at home prior to admission.  No acute PT needs  identified; will sign off (discussed with pt who was in agreement). Mobility specialists plan to continue working with pt.    If plan is discharge home, recommend the following:     Can travel by private vehicle    Yes    Equipment Recommendations None recommended by PT  Recommendations for Other Services       Functional Status Assessment Patient has not had a recent decline in their functional status     Precautions / Restrictions Precautions Precautions: Fall;Back Precaution Comments: Pt reports TLSO (for prior recent back surgery) was only for comfort and she doesn't have to wear it (and has not worn it since she left the hospital) Restrictions Weight Bearing Restrictions: No      Mobility  Bed Mobility Overal bed mobility: Modified Independent (HOB elevated)             General bed mobility comments: Able to get OOB easily and crawled (on her hands and knees) getting into bed    Transfers Overall transfer level: Independent Equipment used: None Transfers: Sit to/from Stand Sit to Stand: Independent           General transfer comment: steady safe transfers    Ambulation/Gait Ambulation/Gait assistance: Independent Gait Distance (Feet): 200 Feet Assistive device: None Gait Pattern/deviations: Step-through pattern       General Gait Details: steady ambulation  Stairs            Wheelchair Mobility     Tilt Bed    Modified Rankin (Stroke Patients Only)  Balance Overall balance assessment: Independent Sitting-balance support: No upper extremity supported, Feet supported   Sitting balance - Comments: steady reaching outside BOS   Standing balance support: During functional activity, No upper extremity supported Standing balance-Leahy Scale: Normal Standing balance comment: no loss of balance with ambulation                             Pertinent Vitals/Pain Pain Assessment Pain Assessment: 0-10 Pain Score: 5   Pain Location: low back Pain Descriptors / Indicators: Aching, Sore, Tender Pain Intervention(s): Limited activity within patient's tolerance, Monitored during session, Premedicated before session, Repositioned, Patient requesting pain meds-RN notified HR WFL during sessions activities.    Home Living Family/patient expects to be discharged to:: Private residence Living Arrangements: Alone Available Help at Discharge: Friend(s);Available PRN/intermittently;Neighbor Type of Home: Apartment Home Access: Level entry       Home Layout: One level Home Equipment: Agricultural consultant (2 wheels)      Prior Function Prior Level of Function : Independent/Modified Independent             Mobility Comments: Independent with ambulation.  No recent falls reported. ADLs Comments: Independent in ADL's/IADL's     Extremity/Trunk Assessment   Upper Extremity Assessment Upper Extremity Assessment: Overall WFL for tasks assessed    Lower Extremity Assessment Lower Extremity Assessment: Overall WFL for tasks assessed    Cervical / Trunk Assessment Cervical / Trunk Assessment: Back Surgery  Communication   Communication Communication: No apparent difficulties Cueing Techniques: Verbal cues  Cognition Arousal: Alert Behavior During Therapy: WFL for tasks assessed/performed, Anxious Overall Cognitive Status: Within Functional Limits for tasks assessed                                          General Comments  Nursing cleared pt for participation in physical therapy.  Pt agreeable to PT session.     Exercises     Assessment/Plan    PT Assessment Patient does not need any further PT services  PT Problem List Cardiopulmonary status limiting activity       PT Treatment Interventions      PT Goals (Current goals can be found in the Care Plan section)  Acute Rehab PT Goals Patient Stated Goal: to go home PT Goal Formulation: With patient Time For Goal  Achievement: 03/30/23 Potential to Achieve Goals: Good    Frequency       Co-evaluation               AM-PAC PT "6 Clicks" Mobility  Outcome Measure Help needed turning from your back to your side while in a flat bed without using bedrails?: None Help needed moving from lying on your back to sitting on the side of a flat bed without using bedrails?: None Help needed moving to and from a bed to a chair (including a wheelchair)?: None Help needed standing up from a chair using your arms (e.g., wheelchair or bedside chair)?: None Help needed to walk in hospital room?: None Help needed climbing 3-5 steps with a railing? : None 6 Click Score: 24    End of Session Equipment Utilized During Treatment: Oxygen Activity Tolerance: Patient tolerated treatment well Patient left: in bed;with call bell/phone within reach Nurse Communication: Mobility status;Precautions;Other (comment) (Pt's SpO2 sats and O2 needs during session) PT Visit Diagnosis: Other abnormalities  of gait and mobility (R26.89)    Time: 1610-9604 PT Time Calculation (min) (ACUTE ONLY): 19 min   Charges:   PT Evaluation $PT Eval Low Complexity: 1 Low   PT General Charges $$ ACUTE PT VISIT: 1 Visit        Hendricks Limes, PT 03/16/23, 4:58 PM

## 2023-03-16 NOTE — Plan of Care (Signed)
  Problem: Education: Goal: Ability to verbalize activity precautions or restrictions will improve Outcome: Progressing   Problem: Activity: Goal: Ability to avoid complications of mobility impairment will improve Outcome: Progressing   Problem: Bowel/Gastric: Goal: Gastrointestinal status for postoperative course will improve Outcome: Progressing   Problem: Skin Integrity: Goal: Will show signs of wound healing Outcome: Progressing   Problem: Health Behavior/Discharge Planning: Goal: Identification of resources available to assist in meeting health care needs will improve Outcome: Progressing   Problem: Pain Management: Goal: Pain level will decrease Outcome: Adequate for Discharge   Problem: Bladder/Genitourinary: Goal: Urinary functional status for postoperative course will improve Outcome: Adequate for Discharge

## 2023-03-17 ENCOUNTER — Other Ambulatory Visit: Payer: Self-pay | Admitting: *Deleted

## 2023-03-17 ENCOUNTER — Other Ambulatory Visit: Payer: Self-pay

## 2023-03-17 ENCOUNTER — Inpatient Hospital Stay: Payer: Managed Care, Other (non HMO)

## 2023-03-17 ENCOUNTER — Ambulatory Visit: Payer: Managed Care, Other (non HMO)

## 2023-03-17 DIAGNOSIS — J9601 Acute respiratory failure with hypoxia: Secondary | ICD-10-CM | POA: Diagnosis not present

## 2023-03-17 DIAGNOSIS — J90 Pleural effusion, not elsewhere classified: Secondary | ICD-10-CM | POA: Diagnosis not present

## 2023-03-17 DIAGNOSIS — J15212 Pneumonia due to Methicillin resistant Staphylococcus aureus: Secondary | ICD-10-CM | POA: Diagnosis not present

## 2023-03-17 LAB — BASIC METABOLIC PANEL
Anion gap: 9 (ref 5–15)
BUN: 9 mg/dL (ref 6–20)
CO2: 28 mmol/L (ref 22–32)
Calcium: 9 mg/dL (ref 8.9–10.3)
Chloride: 103 mmol/L (ref 98–111)
Creatinine, Ser: 1.52 mg/dL — ABNORMAL HIGH (ref 0.44–1.00)
GFR, Estimated: 39 mL/min — ABNORMAL LOW (ref >60)
Glucose, Bld: 83 mg/dL (ref 70–99)
Potassium: 4 mmol/L (ref 3.5–5.1)
Sodium: 140 mmol/L (ref 135–145)

## 2023-03-17 LAB — PROCALCITONIN: Procalcitonin: <0.1 ng/mL

## 2023-03-17 LAB — RAD ONC ARIA SESSION SUMMARY
Course Elapsed Days: 0
Plan Fractions Treated to Date: 1
Plan Prescribed Dose Per Fraction: 3 Gy
Plan Total Fractions Prescribed: 10
Plan Total Prescribed Dose: 30 Gy
Reference Point Dosage Given to Date: 3 Gy
Reference Point Session Dosage Given: 3 Gy
Session Number: 1

## 2023-03-17 LAB — CBC WITH DIFFERENTIAL/PLATELET
Abs Immature Granulocytes: 0.02 10*3/uL (ref 0.00–0.07)
Basophils Absolute: 0.1 10*3/uL (ref 0.0–0.1)
Basophils Relative: 1 %
Eosinophils Absolute: 0 10*3/uL (ref 0.0–0.5)
Eosinophils Relative: 1 %
HCT: 23.9 % — ABNORMAL LOW (ref 36.0–46.0)
Hemoglobin: 7.6 g/dL — ABNORMAL LOW (ref 12.0–15.0)
Immature Granulocytes: 0 %
Lymphocytes Relative: 31 %
Lymphs Abs: 1.9 10*3/uL (ref 0.7–4.0)
MCH: 29.2 pg (ref 26.0–34.0)
MCHC: 31.8 g/dL (ref 30.0–36.0)
MCV: 91.9 fL (ref 80.0–100.0)
Monocytes Absolute: 0.7 10*3/uL (ref 0.1–1.0)
Monocytes Relative: 11 %
Neutro Abs: 3.5 10*3/uL (ref 1.7–7.7)
Neutrophils Relative %: 56 %
Platelets: 247 10*3/uL (ref 150–400)
RBC: 2.6 MIL/uL — ABNORMAL LOW (ref 3.87–5.11)
RDW: 14.7 % (ref 11.5–15.5)
WBC: 6.1 10*3/uL (ref 4.0–10.5)
nRBC: 0 % (ref 0.0–0.2)

## 2023-03-17 LAB — ACID FAST SMEAR (AFB, MYCOBACTERIA): Acid Fast Smear: NEGATIVE

## 2023-03-17 MED ORDER — LINEZOLID 600 MG PO TABS: 600.0000 mg | ORAL_TABLET | Freq: Two times a day (BID) | ORAL | 0 refills | Status: DC

## 2023-03-17 MED ORDER — LINEZOLID 600 MG PO TABS
600.0000 mg | ORAL_TABLET | Freq: Two times a day (BID) | ORAL | Status: DC
  Filled 2023-03-17: qty 1

## 2023-03-17 MED ORDER — LINEZOLID 600 MG PO TABS: 600.0000 mg | ORAL_TABLET | Freq: Two times a day (BID) | ORAL | Status: DC

## 2023-03-17 MED ORDER — SENNOSIDES-DOCUSATE SODIUM 8.6-50 MG PO TABS: 1.0000 | ORAL_TABLET | Freq: Two times a day (BID) | ORAL | 0 refills | Status: DC

## 2023-03-17 NOTE — Plan of Care (Signed)
  Problem: Activity: Goal: Ability to tolerate increased activity will improve Outcome: Progressing   Problem: Health Behavior/Discharge Planning: Goal: Ability to manage health-related needs will improve Outcome: Progressing   Problem: Activity: Goal: Risk for activity intolerance will decrease Outcome: Progressing   Problem: Nutrition: Goal: Adequate nutrition will be maintained Outcome: Progressing   Problem: Pain Managment: Goal: General experience of comfort will improve Outcome: Progressing   Problem: Skin Integrity: Goal: Risk for impaired skin integrity will decrease Outcome: Progressing

## 2023-03-17 NOTE — Plan of Care (Signed)
  Problem: Education: Goal: Ability to verbalize activity precautions or restrictions will improve Outcome: Progressing   Problem: Activity: Goal: Ability to avoid complications of mobility impairment will improve Outcome: Progressing   Problem: Pain Management: Goal: Pain level will decrease Outcome: Progressing   Problem: Skin Integrity: Goal: Will show signs of wound healing Outcome: Progressing   Problem: Elimination: Goal: Will not experience complications related to bowel motility Outcome: Progressing   Problem: Safety: Goal: Ability to remain free from injury will improve Outcome: Progressing   Problem: Skin Integrity: Goal: Risk for impaired skin integrity will decrease Outcome: Progressing

## 2023-03-17 NOTE — Discharge Summary (Signed)
is normal. No acute bone abnormality. Thoracic fusion hardware again noted. IMPRESSION: 1. Interval placement of left basilar pigtail chest tube with partial evacuation of left-sided pleural effusion. Small residual pleural fluid is suspected, possibly loculated. 2. Residual retrocardiac consolidation in keeping with atelectasis, infiltrate, or loculated pleural fluid within this region. 3. Stable moderate right pleural effusion. Electronically Signed   By: Helyn Numbers M.D.   On: 03/09/2023 21:18   DG Chest Port 1 View  Result Date: 03/09/2023 CLINICAL DATA:  10026 Shortness of breath 10026 EXAM: PORTABLE CHEST 1 VIEW COMPARISON:  March 07, 2023 FINDINGS: The cardiomediastinal silhouette is unchanged in contour. Bilateral pleural effusions, LEFT greater than RIGHT. No pneumothorax. Bibasilar homogeneous opacities within more focal bandlike opacity in LEFT mid lung. Status post posterior fixation of the thoracic spine. IMPRESSION: 1. Bilateral pleural effusions, LEFT greater than RIGHT. 2. Bibasilar opacities likely reflect atelectasis. Electronically Signed   By: Meda Klinefelter M.D.   On: 03/09/2023 10:15   ECHOCARDIOGRAM COMPLETE  Result Date: 03/08/2023    ECHOCARDIOGRAM REPORT   Patient Name:   Paula Garrett Date of Exam: 03/08/2023 Medical Rec #:  [id number]      Height:       62.0 in Accession #:    [id number]     Weight:       204.4 lb Date of Birth:  Apr 03, 1966      BSA:          1.929 m Patient Age:    57 years       BP:           121/65 mmHg Patient Gender: F              HR:           63 bpm. Exam Location:  [facility] Procedure: 2D Echo Indications:     Pleural effusion  History:         Patient has prior history of Echocardiogram examinations, most                  recent 01/31/2023.  Sonographer:     Overton Mam RDCS, FASE Referring Phys:  [id number] Alphonsus Sias GRIFFITH Diagnosing Phys: Lennie Odor MD IMPRESSIONS  1. Left ventricular ejection fraction, by estimation, is 60 to 65%. The left ventricle has normal function. The left ventricle has no regional wall motion abnormalities. Left ventricular diastolic parameters were normal.  2. Right ventricular systolic function is normal. The right ventricular size is normal. There is normal pulmonary artery systolic pressure. The estimated right ventricular systolic pressure is 31.8 mmHg.  3. The mitral valve is grossly normal. Trivial mitral valve regurgitation.  4. The aortic valve is tricuspid. Aortic  valve regurgitation is not visualized. No aortic stenosis is present.  5. The inferior vena cava is dilated in size with >50% respiratory variability, suggesting right atrial pressure of 8 mmHg. FINDINGS  Left Ventricle: Left ventricular ejection fraction, by estimation, is 60 to 65%. The left ventricle has normal function. The left ventricle has no regional wall motion abnormalities. The left ventricular internal cavity size was normal in size. There is  no left ventricular hypertrophy. Left ventricular diastolic parameters were normal. Right Ventricle: The right ventricular size is normal. No increase in right ventricular wall thickness. Right ventricular systolic function is normal. There is normal pulmonary artery systolic pressure. The tricuspid regurgitant velocity is 2.44 m/s, and  with an assumed right atrial pressure of 8 mmHg, the estimated right ventricular  is normal. No acute bone abnormality. Thoracic fusion hardware again noted. IMPRESSION: 1. Interval placement of left basilar pigtail chest tube with partial evacuation of left-sided pleural effusion. Small residual pleural fluid is suspected, possibly loculated. 2. Residual retrocardiac consolidation in keeping with atelectasis, infiltrate, or loculated pleural fluid within this region. 3. Stable moderate right pleural effusion. Electronically Signed   By: Helyn Numbers M.D.   On: 03/09/2023 21:18   DG Chest Port 1 View  Result Date: 03/09/2023 CLINICAL DATA:  10026 Shortness of breath 10026 EXAM: PORTABLE CHEST 1 VIEW COMPARISON:  March 07, 2023 FINDINGS: The cardiomediastinal silhouette is unchanged in contour. Bilateral pleural effusions, LEFT greater than RIGHT. No pneumothorax. Bibasilar homogeneous opacities within more focal bandlike opacity in LEFT mid lung. Status post posterior fixation of the thoracic spine. IMPRESSION: 1. Bilateral pleural effusions, LEFT greater than RIGHT. 2. Bibasilar opacities likely reflect atelectasis. Electronically Signed   By: Meda Klinefelter M.D.   On: 03/09/2023 10:15   ECHOCARDIOGRAM COMPLETE  Result Date: 03/08/2023    ECHOCARDIOGRAM REPORT   Patient Name:   Paula Garrett Date of Exam: 03/08/2023 Medical Rec #:  [id number]      Height:       62.0 in Accession #:    [id number]     Weight:       204.4 lb Date of Birth:  Apr 03, 1966      BSA:          1.929 m Patient Age:    57 years       BP:           121/65 mmHg Patient Gender: F              HR:           63 bpm. Exam Location:  [facility] Procedure: 2D Echo Indications:     Pleural effusion  History:         Patient has prior history of Echocardiogram examinations, most                  recent 01/31/2023.  Sonographer:     Overton Mam RDCS, FASE Referring Phys:  [id number] Alphonsus Sias GRIFFITH Diagnosing Phys: Lennie Odor MD IMPRESSIONS  1. Left ventricular ejection fraction, by estimation, is 60 to 65%. The left ventricle has normal function. The left ventricle has no regional wall motion abnormalities. Left ventricular diastolic parameters were normal.  2. Right ventricular systolic function is normal. The right ventricular size is normal. There is normal pulmonary artery systolic pressure. The estimated right ventricular systolic pressure is 31.8 mmHg.  3. The mitral valve is grossly normal. Trivial mitral valve regurgitation.  4. The aortic valve is tricuspid. Aortic  valve regurgitation is not visualized. No aortic stenosis is present.  5. The inferior vena cava is dilated in size with >50% respiratory variability, suggesting right atrial pressure of 8 mmHg. FINDINGS  Left Ventricle: Left ventricular ejection fraction, by estimation, is 60 to 65%. The left ventricle has normal function. The left ventricle has no regional wall motion abnormalities. The left ventricular internal cavity size was normal in size. There is  no left ventricular hypertrophy. Left ventricular diastolic parameters were normal. Right Ventricle: The right ventricular size is normal. No increase in right ventricular wall thickness. Right ventricular systolic function is normal. There is normal pulmonary artery systolic pressure. The tricuspid regurgitant velocity is 2.44 m/s, and  with an assumed right atrial pressure of 8 mmHg, the estimated right ventricular  Physician Discharge Summary   Patient: Paula Garrett MRN: [id number] DOB: January 25, 1966  Admit date:     03/06/2023  Discharge date: 03/17/23  Discharge Physician: Loyce Dys   PCP: Smitty Cords, DO   Recommendations at discharge:  Follow-up with pulmonologist, oncologist  Hospital Course: HPI on admission 03/06/23:  "Zaharah Amir is a 57 year old female with history of current tobacco use, recently diagnosed multiple myeloma status post cycle 1 of cyclophosphamide Velcade dexamethasone chemotherapy, history of COPD, who presents emergency department from outpatient cancer center for chief concerns of shortness of breath."   Vitals in the ED showed temperature of 98.3, respiration rate 24, heart rate of 73, blood pressure 149/133, SpO2 96% on 6 L nasal cannula.  Chest x-ray showed a large left pleural effusion over 50% of left hemithorax.   See H&P for full HPI on admission and detailed ER course.  Patient was in respiratory distress requiring BiPAP on admission.  Patient required chest tube placement and removal on account of left-sided pleural effusion.  Patient's oxygen requirement has improved and currently on 2 L of intranasal oxygen.  Sputum culture results showing MRSA.  Infectious disease has recommended discharge on p.o. Zyvox for 5-7 more days.  Patient has upcoming appointment with oncologist as well as pulmonologist.     Discharge diagnosis: * Recurrent left pleural effusion Exudative pleural effusion Pulmonology performed bedside U/S-guided thoracentesis (9/20), 1600 cc fluid removed --Pulmonology and ID are following --Left chest tube placed 9/22 and removed on 03/13/2023 --IV lasix 20 mg x 2 (9/21, 9/22) - hold diuresis, Cr rising. --Follow pleural fluid cultures and cytology.  --Echo preserved EF, grade I diastolic dysfunction --Follow up consultant recommendations --ID changed Vanc >> Linezolid 9/23 Continue on Linezolid for 5-7 more days according to  infectious disease given MRSA in sputum Zosyn have been discontinued by ID on 03/14/2023 Patient is status post bronchoscopy on 03/13/2023 by Dr. Sharlyn Bologna lavage samples sent results pending Chest tube removed on 03/13/2023 I have discussed the case with infectious disease specialist Dr. Rivka Safer   COPD with acute exacerbation (HCC) Seems more likely due to left pleural effusion.  Pt not wheezing on exam. Given Solu-medrol in ED. Defer further steroids for now. Pulmonology following. Continue Duonebs QID Continue Dulera & Incruse   Acute on chronic respiratory failure with hypoxemia (HCC) Pt recently discharged on home oxygen. Presented in respiratory distress requiring BiPAP. Currently on 2 L intranasal oxygen Secondary to recurrent left sided pleural effusion complicated by patient with severe COPD. 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   --Resumed on home regimen - gabapentin, fentanyl patch (last placed 1700 on 9/18 at home, confirmed by family), PO dilaudid PRN    AKI (acute kidney injury) (HCC) Baseline renal function unclear, has been recovering from severe AKI related to multiple myeloma, steadily improving since starting treatment.  Mild AKI currently due to diuresis most likely. 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   Outpatient follow-up with oncologist   Anxiety Continue on home Lexapro, Klonopin BID   OSA (obstructive sleep apnea) Not on CPAP   Tobacco use disorder Nicotine patch ordered   Hypothyroidism Continue Synthroid   Benign essential hypertension Continue home  Physician Discharge Summary   Patient: Paula Garrett MRN: [id number] DOB: January 25, 1966  Admit date:     03/06/2023  Discharge date: 03/17/23  Discharge Physician: Loyce Dys   PCP: Smitty Cords, DO   Recommendations at discharge:  Follow-up with pulmonologist, oncologist  Hospital Course: HPI on admission 03/06/23:  "Zaharah Amir is a 57 year old female with history of current tobacco use, recently diagnosed multiple myeloma status post cycle 1 of cyclophosphamide Velcade dexamethasone chemotherapy, history of COPD, who presents emergency department from outpatient cancer center for chief concerns of shortness of breath."   Vitals in the ED showed temperature of 98.3, respiration rate 24, heart rate of 73, blood pressure 149/133, SpO2 96% on 6 L nasal cannula.  Chest x-ray showed a large left pleural effusion over 50% of left hemithorax.   See H&P for full HPI on admission and detailed ER course.  Patient was in respiratory distress requiring BiPAP on admission.  Patient required chest tube placement and removal on account of left-sided pleural effusion.  Patient's oxygen requirement has improved and currently on 2 L of intranasal oxygen.  Sputum culture results showing MRSA.  Infectious disease has recommended discharge on p.o. Zyvox for 5-7 more days.  Patient has upcoming appointment with oncologist as well as pulmonologist.     Discharge diagnosis: * Recurrent left pleural effusion Exudative pleural effusion Pulmonology performed bedside U/S-guided thoracentesis (9/20), 1600 cc fluid removed --Pulmonology and ID are following --Left chest tube placed 9/22 and removed on 03/13/2023 --IV lasix 20 mg x 2 (9/21, 9/22) - hold diuresis, Cr rising. --Follow pleural fluid cultures and cytology.  --Echo preserved EF, grade I diastolic dysfunction --Follow up consultant recommendations --ID changed Vanc >> Linezolid 9/23 Continue on Linezolid for 5-7 more days according to  infectious disease given MRSA in sputum Zosyn have been discontinued by ID on 03/14/2023 Patient is status post bronchoscopy on 03/13/2023 by Dr. Sharlyn Bologna lavage samples sent results pending Chest tube removed on 03/13/2023 I have discussed the case with infectious disease specialist Dr. Rivka Safer   COPD with acute exacerbation (HCC) Seems more likely due to left pleural effusion.  Pt not wheezing on exam. Given Solu-medrol in ED. Defer further steroids for now. Pulmonology following. Continue Duonebs QID Continue Dulera & Incruse   Acute on chronic respiratory failure with hypoxemia (HCC) Pt recently discharged on home oxygen. Presented in respiratory distress requiring BiPAP. Currently on 2 L intranasal oxygen Secondary to recurrent left sided pleural effusion complicated by patient with severe COPD. 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   --Resumed on home regimen - gabapentin, fentanyl patch (last placed 1700 on 9/18 at home, confirmed by family), PO dilaudid PRN    AKI (acute kidney injury) (HCC) Baseline renal function unclear, has been recovering from severe AKI related to multiple myeloma, steadily improving since starting treatment.  Mild AKI currently due to diuresis most likely. 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   Outpatient follow-up with oncologist   Anxiety Continue on home Lexapro, Klonopin BID   OSA (obstructive sleep apnea) Not on CPAP   Tobacco use disorder Nicotine patch ordered   Hypothyroidism Continue Synthroid   Benign essential hypertension Continue home  Physician Discharge Summary   Patient: Paula Garrett MRN: [id number] DOB: January 25, 1966  Admit date:     03/06/2023  Discharge date: 03/17/23  Discharge Physician: Loyce Dys   PCP: Smitty Cords, DO   Recommendations at discharge:  Follow-up with pulmonologist, oncologist  Hospital Course: HPI on admission 03/06/23:  "Zaharah Amir is a 57 year old female with history of current tobacco use, recently diagnosed multiple myeloma status post cycle 1 of cyclophosphamide Velcade dexamethasone chemotherapy, history of COPD, who presents emergency department from outpatient cancer center for chief concerns of shortness of breath."   Vitals in the ED showed temperature of 98.3, respiration rate 24, heart rate of 73, blood pressure 149/133, SpO2 96% on 6 L nasal cannula.  Chest x-ray showed a large left pleural effusion over 50% of left hemithorax.   See H&P for full HPI on admission and detailed ER course.  Patient was in respiratory distress requiring BiPAP on admission.  Patient required chest tube placement and removal on account of left-sided pleural effusion.  Patient's oxygen requirement has improved and currently on 2 L of intranasal oxygen.  Sputum culture results showing MRSA.  Infectious disease has recommended discharge on p.o. Zyvox for 5-7 more days.  Patient has upcoming appointment with oncologist as well as pulmonologist.     Discharge diagnosis: * Recurrent left pleural effusion Exudative pleural effusion Pulmonology performed bedside U/S-guided thoracentesis (9/20), 1600 cc fluid removed --Pulmonology and ID are following --Left chest tube placed 9/22 and removed on 03/13/2023 --IV lasix 20 mg x 2 (9/21, 9/22) - hold diuresis, Cr rising. --Follow pleural fluid cultures and cytology.  --Echo preserved EF, grade I diastolic dysfunction --Follow up consultant recommendations --ID changed Vanc >> Linezolid 9/23 Continue on Linezolid for 5-7 more days according to  infectious disease given MRSA in sputum Zosyn have been discontinued by ID on 03/14/2023 Patient is status post bronchoscopy on 03/13/2023 by Dr. Sharlyn Bologna lavage samples sent results pending Chest tube removed on 03/13/2023 I have discussed the case with infectious disease specialist Dr. Rivka Safer   COPD with acute exacerbation (HCC) Seems more likely due to left pleural effusion.  Pt not wheezing on exam. Given Solu-medrol in ED. Defer further steroids for now. Pulmonology following. Continue Duonebs QID Continue Dulera & Incruse   Acute on chronic respiratory failure with hypoxemia (HCC) Pt recently discharged on home oxygen. Presented in respiratory distress requiring BiPAP. Currently on 2 L intranasal oxygen Secondary to recurrent left sided pleural effusion complicated by patient with severe COPD. 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   --Resumed on home regimen - gabapentin, fentanyl patch (last placed 1700 on 9/18 at home, confirmed by family), PO dilaudid PRN    AKI (acute kidney injury) (HCC) Baseline renal function unclear, has been recovering from severe AKI related to multiple myeloma, steadily improving since starting treatment.  Mild AKI currently due to diuresis most likely. 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   Outpatient follow-up with oncologist   Anxiety Continue on home Lexapro, Klonopin BID   OSA (obstructive sleep apnea) Not on CPAP   Tobacco use disorder Nicotine patch ordered   Hypothyroidism Continue Synthroid   Benign essential hypertension Continue home  is normal. No acute bone abnormality. Thoracic fusion hardware again noted. IMPRESSION: 1. Interval placement of left basilar pigtail chest tube with partial evacuation of left-sided pleural effusion. Small residual pleural fluid is suspected, possibly loculated. 2. Residual retrocardiac consolidation in keeping with atelectasis, infiltrate, or loculated pleural fluid within this region. 3. Stable moderate right pleural effusion. Electronically Signed   By: Helyn Numbers M.D.   On: 03/09/2023 21:18   DG Chest Port 1 View  Result Date: 03/09/2023 CLINICAL DATA:  10026 Shortness of breath 10026 EXAM: PORTABLE CHEST 1 VIEW COMPARISON:  March 07, 2023 FINDINGS: The cardiomediastinal silhouette is unchanged in contour. Bilateral pleural effusions, LEFT greater than RIGHT. No pneumothorax. Bibasilar homogeneous opacities within more focal bandlike opacity in LEFT mid lung. Status post posterior fixation of the thoracic spine. IMPRESSION: 1. Bilateral pleural effusions, LEFT greater than RIGHT. 2. Bibasilar opacities likely reflect atelectasis. Electronically Signed   By: Meda Klinefelter M.D.   On: 03/09/2023 10:15   ECHOCARDIOGRAM COMPLETE  Result Date: 03/08/2023    ECHOCARDIOGRAM REPORT   Patient Name:   Paula Garrett Date of Exam: 03/08/2023 Medical Rec #:  [id number]      Height:       62.0 in Accession #:    [id number]     Weight:       204.4 lb Date of Birth:  Apr 03, 1966      BSA:          1.929 m Patient Age:    57 years       BP:           121/65 mmHg Patient Gender: F              HR:           63 bpm. Exam Location:  [facility] Procedure: 2D Echo Indications:     Pleural effusion  History:         Patient has prior history of Echocardiogram examinations, most                  recent 01/31/2023.  Sonographer:     Overton Mam RDCS, FASE Referring Phys:  [id number] Alphonsus Sias GRIFFITH Diagnosing Phys: Lennie Odor MD IMPRESSIONS  1. Left ventricular ejection fraction, by estimation, is 60 to 65%. The left ventricle has normal function. The left ventricle has no regional wall motion abnormalities. Left ventricular diastolic parameters were normal.  2. Right ventricular systolic function is normal. The right ventricular size is normal. There is normal pulmonary artery systolic pressure. The estimated right ventricular systolic pressure is 31.8 mmHg.  3. The mitral valve is grossly normal. Trivial mitral valve regurgitation.  4. The aortic valve is tricuspid. Aortic  valve regurgitation is not visualized. No aortic stenosis is present.  5. The inferior vena cava is dilated in size with >50% respiratory variability, suggesting right atrial pressure of 8 mmHg. FINDINGS  Left Ventricle: Left ventricular ejection fraction, by estimation, is 60 to 65%. The left ventricle has normal function. The left ventricle has no regional wall motion abnormalities. The left ventricular internal cavity size was normal in size. There is  no left ventricular hypertrophy. Left ventricular diastolic parameters were normal. Right Ventricle: The right ventricular size is normal. No increase in right ventricular wall thickness. Right ventricular systolic function is normal. There is normal pulmonary artery systolic pressure. The tricuspid regurgitant velocity is 2.44 m/s, and  with an assumed right atrial pressure of 8 mmHg, the estimated right ventricular  is normal. No acute bone abnormality. Thoracic fusion hardware again noted. IMPRESSION: 1. Interval placement of left basilar pigtail chest tube with partial evacuation of left-sided pleural effusion. Small residual pleural fluid is suspected, possibly loculated. 2. Residual retrocardiac consolidation in keeping with atelectasis, infiltrate, or loculated pleural fluid within this region. 3. Stable moderate right pleural effusion. Electronically Signed   By: Helyn Numbers M.D.   On: 03/09/2023 21:18   DG Chest Port 1 View  Result Date: 03/09/2023 CLINICAL DATA:  10026 Shortness of breath 10026 EXAM: PORTABLE CHEST 1 VIEW COMPARISON:  March 07, 2023 FINDINGS: The cardiomediastinal silhouette is unchanged in contour. Bilateral pleural effusions, LEFT greater than RIGHT. No pneumothorax. Bibasilar homogeneous opacities within more focal bandlike opacity in LEFT mid lung. Status post posterior fixation of the thoracic spine. IMPRESSION: 1. Bilateral pleural effusions, LEFT greater than RIGHT. 2. Bibasilar opacities likely reflect atelectasis. Electronically Signed   By: Meda Klinefelter M.D.   On: 03/09/2023 10:15   ECHOCARDIOGRAM COMPLETE  Result Date: 03/08/2023    ECHOCARDIOGRAM REPORT   Patient Name:   Paula Garrett Date of Exam: 03/08/2023 Medical Rec #:  [id number]      Height:       62.0 in Accession #:    [id number]     Weight:       204.4 lb Date of Birth:  Apr 03, 1966      BSA:          1.929 m Patient Age:    57 years       BP:           121/65 mmHg Patient Gender: F              HR:           63 bpm. Exam Location:  [facility] Procedure: 2D Echo Indications:     Pleural effusion  History:         Patient has prior history of Echocardiogram examinations, most                  recent 01/31/2023.  Sonographer:     Overton Mam RDCS, FASE Referring Phys:  [id number] Alphonsus Sias GRIFFITH Diagnosing Phys: Lennie Odor MD IMPRESSIONS  1. Left ventricular ejection fraction, by estimation, is 60 to 65%. The left ventricle has normal function. The left ventricle has no regional wall motion abnormalities. Left ventricular diastolic parameters were normal.  2. Right ventricular systolic function is normal. The right ventricular size is normal. There is normal pulmonary artery systolic pressure. The estimated right ventricular systolic pressure is 31.8 mmHg.  3. The mitral valve is grossly normal. Trivial mitral valve regurgitation.  4. The aortic valve is tricuspid. Aortic  valve regurgitation is not visualized. No aortic stenosis is present.  5. The inferior vena cava is dilated in size with >50% respiratory variability, suggesting right atrial pressure of 8 mmHg. FINDINGS  Left Ventricle: Left ventricular ejection fraction, by estimation, is 60 to 65%. The left ventricle has normal function. The left ventricle has no regional wall motion abnormalities. The left ventricular internal cavity size was normal in size. There is  no left ventricular hypertrophy. Left ventricular diastolic parameters were normal. Right Ventricle: The right ventricular size is normal. No increase in right ventricular wall thickness. Right ventricular systolic function is normal. There is normal pulmonary artery systolic pressure. The tricuspid regurgitant velocity is 2.44 m/s, and  with an assumed right atrial pressure of 8 mmHg, the estimated right ventricular  Physician Discharge Summary   Patient: Paula Garrett MRN: [id number] DOB: January 25, 1966  Admit date:     03/06/2023  Discharge date: 03/17/23  Discharge Physician: Loyce Dys   PCP: Smitty Cords, DO   Recommendations at discharge:  Follow-up with pulmonologist, oncologist  Hospital Course: HPI on admission 03/06/23:  "Zaharah Amir is a 57 year old female with history of current tobacco use, recently diagnosed multiple myeloma status post cycle 1 of cyclophosphamide Velcade dexamethasone chemotherapy, history of COPD, who presents emergency department from outpatient cancer center for chief concerns of shortness of breath."   Vitals in the ED showed temperature of 98.3, respiration rate 24, heart rate of 73, blood pressure 149/133, SpO2 96% on 6 L nasal cannula.  Chest x-ray showed a large left pleural effusion over 50% of left hemithorax.   See H&P for full HPI on admission and detailed ER course.  Patient was in respiratory distress requiring BiPAP on admission.  Patient required chest tube placement and removal on account of left-sided pleural effusion.  Patient's oxygen requirement has improved and currently on 2 L of intranasal oxygen.  Sputum culture results showing MRSA.  Infectious disease has recommended discharge on p.o. Zyvox for 5-7 more days.  Patient has upcoming appointment with oncologist as well as pulmonologist.     Discharge diagnosis: * Recurrent left pleural effusion Exudative pleural effusion Pulmonology performed bedside U/S-guided thoracentesis (9/20), 1600 cc fluid removed --Pulmonology and ID are following --Left chest tube placed 9/22 and removed on 03/13/2023 --IV lasix 20 mg x 2 (9/21, 9/22) - hold diuresis, Cr rising. --Follow pleural fluid cultures and cytology.  --Echo preserved EF, grade I diastolic dysfunction --Follow up consultant recommendations --ID changed Vanc >> Linezolid 9/23 Continue on Linezolid for 5-7 more days according to  infectious disease given MRSA in sputum Zosyn have been discontinued by ID on 03/14/2023 Patient is status post bronchoscopy on 03/13/2023 by Dr. Sharlyn Bologna lavage samples sent results pending Chest tube removed on 03/13/2023 I have discussed the case with infectious disease specialist Dr. Rivka Safer   COPD with acute exacerbation (HCC) Seems more likely due to left pleural effusion.  Pt not wheezing on exam. Given Solu-medrol in ED. Defer further steroids for now. Pulmonology following. Continue Duonebs QID Continue Dulera & Incruse   Acute on chronic respiratory failure with hypoxemia (HCC) Pt recently discharged on home oxygen. Presented in respiratory distress requiring BiPAP. Currently on 2 L intranasal oxygen Secondary to recurrent left sided pleural effusion complicated by patient with severe COPD. 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   --Resumed on home regimen - gabapentin, fentanyl patch (last placed 1700 on 9/18 at home, confirmed by family), PO dilaudid PRN    AKI (acute kidney injury) (HCC) Baseline renal function unclear, has been recovering from severe AKI related to multiple myeloma, steadily improving since starting treatment.  Mild AKI currently due to diuresis most likely. 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   Outpatient follow-up with oncologist   Anxiety Continue on home Lexapro, Klonopin BID   OSA (obstructive sleep apnea) Not on CPAP   Tobacco use disorder Nicotine patch ordered   Hypothyroidism Continue Synthroid   Benign essential hypertension Continue home  is normal. No acute bone abnormality. Thoracic fusion hardware again noted. IMPRESSION: 1. Interval placement of left basilar pigtail chest tube with partial evacuation of left-sided pleural effusion. Small residual pleural fluid is suspected, possibly loculated. 2. Residual retrocardiac consolidation in keeping with atelectasis, infiltrate, or loculated pleural fluid within this region. 3. Stable moderate right pleural effusion. Electronically Signed   By: Helyn Numbers M.D.   On: 03/09/2023 21:18   DG Chest Port 1 View  Result Date: 03/09/2023 CLINICAL DATA:  10026 Shortness of breath 10026 EXAM: PORTABLE CHEST 1 VIEW COMPARISON:  March 07, 2023 FINDINGS: The cardiomediastinal silhouette is unchanged in contour. Bilateral pleural effusions, LEFT greater than RIGHT. No pneumothorax. Bibasilar homogeneous opacities within more focal bandlike opacity in LEFT mid lung. Status post posterior fixation of the thoracic spine. IMPRESSION: 1. Bilateral pleural effusions, LEFT greater than RIGHT. 2. Bibasilar opacities likely reflect atelectasis. Electronically Signed   By: Meda Klinefelter M.D.   On: 03/09/2023 10:15   ECHOCARDIOGRAM COMPLETE  Result Date: 03/08/2023    ECHOCARDIOGRAM REPORT   Patient Name:   Paula Garrett Date of Exam: 03/08/2023 Medical Rec #:  [id number]      Height:       62.0 in Accession #:    [id number]     Weight:       204.4 lb Date of Birth:  Apr 03, 1966      BSA:          1.929 m Patient Age:    57 years       BP:           121/65 mmHg Patient Gender: F              HR:           63 bpm. Exam Location:  [facility] Procedure: 2D Echo Indications:     Pleural effusion  History:         Patient has prior history of Echocardiogram examinations, most                  recent 01/31/2023.  Sonographer:     Overton Mam RDCS, FASE Referring Phys:  [id number] Alphonsus Sias GRIFFITH Diagnosing Phys: Lennie Odor MD IMPRESSIONS  1. Left ventricular ejection fraction, by estimation, is 60 to 65%. The left ventricle has normal function. The left ventricle has no regional wall motion abnormalities. Left ventricular diastolic parameters were normal.  2. Right ventricular systolic function is normal. The right ventricular size is normal. There is normal pulmonary artery systolic pressure. The estimated right ventricular systolic pressure is 31.8 mmHg.  3. The mitral valve is grossly normal. Trivial mitral valve regurgitation.  4. The aortic valve is tricuspid. Aortic  valve regurgitation is not visualized. No aortic stenosis is present.  5. The inferior vena cava is dilated in size with >50% respiratory variability, suggesting right atrial pressure of 8 mmHg. FINDINGS  Left Ventricle: Left ventricular ejection fraction, by estimation, is 60 to 65%. The left ventricle has normal function. The left ventricle has no regional wall motion abnormalities. The left ventricular internal cavity size was normal in size. There is  no left ventricular hypertrophy. Left ventricular diastolic parameters were normal. Right Ventricle: The right ventricular size is normal. No increase in right ventricular wall thickness. Right ventricular systolic function is normal. There is normal pulmonary artery systolic pressure. The tricuspid regurgitant velocity is 2.44 m/s, and  with an assumed right atrial pressure of 8 mmHg, the estimated right ventricular  Physician Discharge Summary   Patient: Paula Garrett MRN: [id number] DOB: January 25, 1966  Admit date:     03/06/2023  Discharge date: 03/17/23  Discharge Physician: Loyce Dys   PCP: Smitty Cords, DO   Recommendations at discharge:  Follow-up with pulmonologist, oncologist  Hospital Course: HPI on admission 03/06/23:  "Zaharah Amir is a 57 year old female with history of current tobacco use, recently diagnosed multiple myeloma status post cycle 1 of cyclophosphamide Velcade dexamethasone chemotherapy, history of COPD, who presents emergency department from outpatient cancer center for chief concerns of shortness of breath."   Vitals in the ED showed temperature of 98.3, respiration rate 24, heart rate of 73, blood pressure 149/133, SpO2 96% on 6 L nasal cannula.  Chest x-ray showed a large left pleural effusion over 50% of left hemithorax.   See H&P for full HPI on admission and detailed ER course.  Patient was in respiratory distress requiring BiPAP on admission.  Patient required chest tube placement and removal on account of left-sided pleural effusion.  Patient's oxygen requirement has improved and currently on 2 L of intranasal oxygen.  Sputum culture results showing MRSA.  Infectious disease has recommended discharge on p.o. Zyvox for 5-7 more days.  Patient has upcoming appointment with oncologist as well as pulmonologist.     Discharge diagnosis: * Recurrent left pleural effusion Exudative pleural effusion Pulmonology performed bedside U/S-guided thoracentesis (9/20), 1600 cc fluid removed --Pulmonology and ID are following --Left chest tube placed 9/22 and removed on 03/13/2023 --IV lasix 20 mg x 2 (9/21, 9/22) - hold diuresis, Cr rising. --Follow pleural fluid cultures and cytology.  --Echo preserved EF, grade I diastolic dysfunction --Follow up consultant recommendations --ID changed Vanc >> Linezolid 9/23 Continue on Linezolid for 5-7 more days according to  infectious disease given MRSA in sputum Zosyn have been discontinued by ID on 03/14/2023 Patient is status post bronchoscopy on 03/13/2023 by Dr. Sharlyn Bologna lavage samples sent results pending Chest tube removed on 03/13/2023 I have discussed the case with infectious disease specialist Dr. Rivka Safer   COPD with acute exacerbation (HCC) Seems more likely due to left pleural effusion.  Pt not wheezing on exam. Given Solu-medrol in ED. Defer further steroids for now. Pulmonology following. Continue Duonebs QID Continue Dulera & Incruse   Acute on chronic respiratory failure with hypoxemia (HCC) Pt recently discharged on home oxygen. Presented in respiratory distress requiring BiPAP. Currently on 2 L intranasal oxygen Secondary to recurrent left sided pleural effusion complicated by patient with severe COPD. 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   --Resumed on home regimen - gabapentin, fentanyl patch (last placed 1700 on 9/18 at home, confirmed by family), PO dilaudid PRN    AKI (acute kidney injury) (HCC) Baseline renal function unclear, has been recovering from severe AKI related to multiple myeloma, steadily improving since starting treatment.  Mild AKI currently due to diuresis most likely. 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   Outpatient follow-up with oncologist   Anxiety Continue on home Lexapro, Klonopin BID   OSA (obstructive sleep apnea) Not on CPAP   Tobacco use disorder Nicotine patch ordered   Hypothyroidism Continue Synthroid   Benign essential hypertension Continue home  Physician Discharge Summary   Patient: Paula Garrett MRN: [id number] DOB: January 25, 1966  Admit date:     03/06/2023  Discharge date: 03/17/23  Discharge Physician: Loyce Dys   PCP: Smitty Cords, DO   Recommendations at discharge:  Follow-up with pulmonologist, oncologist  Hospital Course: HPI on admission 03/06/23:  "Zaharah Amir is a 57 year old female with history of current tobacco use, recently diagnosed multiple myeloma status post cycle 1 of cyclophosphamide Velcade dexamethasone chemotherapy, history of COPD, who presents emergency department from outpatient cancer center for chief concerns of shortness of breath."   Vitals in the ED showed temperature of 98.3, respiration rate 24, heart rate of 73, blood pressure 149/133, SpO2 96% on 6 L nasal cannula.  Chest x-ray showed a large left pleural effusion over 50% of left hemithorax.   See H&P for full HPI on admission and detailed ER course.  Patient was in respiratory distress requiring BiPAP on admission.  Patient required chest tube placement and removal on account of left-sided pleural effusion.  Patient's oxygen requirement has improved and currently on 2 L of intranasal oxygen.  Sputum culture results showing MRSA.  Infectious disease has recommended discharge on p.o. Zyvox for 5-7 more days.  Patient has upcoming appointment with oncologist as well as pulmonologist.     Discharge diagnosis: * Recurrent left pleural effusion Exudative pleural effusion Pulmonology performed bedside U/S-guided thoracentesis (9/20), 1600 cc fluid removed --Pulmonology and ID are following --Left chest tube placed 9/22 and removed on 03/13/2023 --IV lasix 20 mg x 2 (9/21, 9/22) - hold diuresis, Cr rising. --Follow pleural fluid cultures and cytology.  --Echo preserved EF, grade I diastolic dysfunction --Follow up consultant recommendations --ID changed Vanc >> Linezolid 9/23 Continue on Linezolid for 5-7 more days according to  infectious disease given MRSA in sputum Zosyn have been discontinued by ID on 03/14/2023 Patient is status post bronchoscopy on 03/13/2023 by Dr. Sharlyn Bologna lavage samples sent results pending Chest tube removed on 03/13/2023 I have discussed the case with infectious disease specialist Dr. Rivka Safer   COPD with acute exacerbation (HCC) Seems more likely due to left pleural effusion.  Pt not wheezing on exam. Given Solu-medrol in ED. Defer further steroids for now. Pulmonology following. Continue Duonebs QID Continue Dulera & Incruse   Acute on chronic respiratory failure with hypoxemia (HCC) Pt recently discharged on home oxygen. Presented in respiratory distress requiring BiPAP. Currently on 2 L intranasal oxygen Secondary to recurrent left sided pleural effusion complicated by patient with severe COPD. 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   --Resumed on home regimen - gabapentin, fentanyl patch (last placed 1700 on 9/18 at home, confirmed by family), PO dilaudid PRN    AKI (acute kidney injury) (HCC) Baseline renal function unclear, has been recovering from severe AKI related to multiple myeloma, steadily improving since starting treatment.  Mild AKI currently due to diuresis most likely. 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   Outpatient follow-up with oncologist   Anxiety Continue on home Lexapro, Klonopin BID   OSA (obstructive sleep apnea) Not on CPAP   Tobacco use disorder Nicotine patch ordered   Hypothyroidism Continue Synthroid   Benign essential hypertension Continue home  Physician Discharge Summary   Patient: Paula Garrett MRN: [id number] DOB: January 25, 1966  Admit date:     03/06/2023  Discharge date: 03/17/23  Discharge Physician: Loyce Dys   PCP: Smitty Cords, DO   Recommendations at discharge:  Follow-up with pulmonologist, oncologist  Hospital Course: HPI on admission 03/06/23:  "Zaharah Amir is a 57 year old female with history of current tobacco use, recently diagnosed multiple myeloma status post cycle 1 of cyclophosphamide Velcade dexamethasone chemotherapy, history of COPD, who presents emergency department from outpatient cancer center for chief concerns of shortness of breath."   Vitals in the ED showed temperature of 98.3, respiration rate 24, heart rate of 73, blood pressure 149/133, SpO2 96% on 6 L nasal cannula.  Chest x-ray showed a large left pleural effusion over 50% of left hemithorax.   See H&P for full HPI on admission and detailed ER course.  Patient was in respiratory distress requiring BiPAP on admission.  Patient required chest tube placement and removal on account of left-sided pleural effusion.  Patient's oxygen requirement has improved and currently on 2 L of intranasal oxygen.  Sputum culture results showing MRSA.  Infectious disease has recommended discharge on p.o. Zyvox for 5-7 more days.  Patient has upcoming appointment with oncologist as well as pulmonologist.     Discharge diagnosis: * Recurrent left pleural effusion Exudative pleural effusion Pulmonology performed bedside U/S-guided thoracentesis (9/20), 1600 cc fluid removed --Pulmonology and ID are following --Left chest tube placed 9/22 and removed on 03/13/2023 --IV lasix 20 mg x 2 (9/21, 9/22) - hold diuresis, Cr rising. --Follow pleural fluid cultures and cytology.  --Echo preserved EF, grade I diastolic dysfunction --Follow up consultant recommendations --ID changed Vanc >> Linezolid 9/23 Continue on Linezolid for 5-7 more days according to  infectious disease given MRSA in sputum Zosyn have been discontinued by ID on 03/14/2023 Patient is status post bronchoscopy on 03/13/2023 by Dr. Sharlyn Bologna lavage samples sent results pending Chest tube removed on 03/13/2023 I have discussed the case with infectious disease specialist Dr. Rivka Safer   COPD with acute exacerbation (HCC) Seems more likely due to left pleural effusion.  Pt not wheezing on exam. Given Solu-medrol in ED. Defer further steroids for now. Pulmonology following. Continue Duonebs QID Continue Dulera & Incruse   Acute on chronic respiratory failure with hypoxemia (HCC) Pt recently discharged on home oxygen. Presented in respiratory distress requiring BiPAP. Currently on 2 L intranasal oxygen Secondary to recurrent left sided pleural effusion complicated by patient with severe COPD. 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   --Resumed on home regimen - gabapentin, fentanyl patch (last placed 1700 on 9/18 at home, confirmed by family), PO dilaudid PRN    AKI (acute kidney injury) (HCC) Baseline renal function unclear, has been recovering from severe AKI related to multiple myeloma, steadily improving since starting treatment.  Mild AKI currently due to diuresis most likely. 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   Outpatient follow-up with oncologist   Anxiety Continue on home Lexapro, Klonopin BID   OSA (obstructive sleep apnea) Not on CPAP   Tobacco use disorder Nicotine patch ordered   Hypothyroidism Continue Synthroid   Benign essential hypertension Continue home  Physician Discharge Summary   Patient: Paula Garrett MRN: [id number] DOB: January 25, 1966  Admit date:     03/06/2023  Discharge date: 03/17/23  Discharge Physician: Loyce Dys   PCP: Smitty Cords, DO   Recommendations at discharge:  Follow-up with pulmonologist, oncologist  Hospital Course: HPI on admission 03/06/23:  "Zaharah Amir is a 57 year old female with history of current tobacco use, recently diagnosed multiple myeloma status post cycle 1 of cyclophosphamide Velcade dexamethasone chemotherapy, history of COPD, who presents emergency department from outpatient cancer center for chief concerns of shortness of breath."   Vitals in the ED showed temperature of 98.3, respiration rate 24, heart rate of 73, blood pressure 149/133, SpO2 96% on 6 L nasal cannula.  Chest x-ray showed a large left pleural effusion over 50% of left hemithorax.   See H&P for full HPI on admission and detailed ER course.  Patient was in respiratory distress requiring BiPAP on admission.  Patient required chest tube placement and removal on account of left-sided pleural effusion.  Patient's oxygen requirement has improved and currently on 2 L of intranasal oxygen.  Sputum culture results showing MRSA.  Infectious disease has recommended discharge on p.o. Zyvox for 5-7 more days.  Patient has upcoming appointment with oncologist as well as pulmonologist.     Discharge diagnosis: * Recurrent left pleural effusion Exudative pleural effusion Pulmonology performed bedside U/S-guided thoracentesis (9/20), 1600 cc fluid removed --Pulmonology and ID are following --Left chest tube placed 9/22 and removed on 03/13/2023 --IV lasix 20 mg x 2 (9/21, 9/22) - hold diuresis, Cr rising. --Follow pleural fluid cultures and cytology.  --Echo preserved EF, grade I diastolic dysfunction --Follow up consultant recommendations --ID changed Vanc >> Linezolid 9/23 Continue on Linezolid for 5-7 more days according to  infectious disease given MRSA in sputum Zosyn have been discontinued by ID on 03/14/2023 Patient is status post bronchoscopy on 03/13/2023 by Dr. Sharlyn Bologna lavage samples sent results pending Chest tube removed on 03/13/2023 I have discussed the case with infectious disease specialist Dr. Rivka Safer   COPD with acute exacerbation (HCC) Seems more likely due to left pleural effusion.  Pt not wheezing on exam. Given Solu-medrol in ED. Defer further steroids for now. Pulmonology following. Continue Duonebs QID Continue Dulera & Incruse   Acute on chronic respiratory failure with hypoxemia (HCC) Pt recently discharged on home oxygen. Presented in respiratory distress requiring BiPAP. Currently on 2 L intranasal oxygen Secondary to recurrent left sided pleural effusion complicated by patient with severe COPD. 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   --Resumed on home regimen - gabapentin, fentanyl patch (last placed 1700 on 9/18 at home, confirmed by family), PO dilaudid PRN    AKI (acute kidney injury) (HCC) Baseline renal function unclear, has been recovering from severe AKI related to multiple myeloma, steadily improving since starting treatment.  Mild AKI currently due to diuresis most likely. 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   Outpatient follow-up with oncologist   Anxiety Continue on home Lexapro, Klonopin BID   OSA (obstructive sleep apnea) Not on CPAP   Tobacco use disorder Nicotine patch ordered   Hypothyroidism Continue Synthroid   Benign essential hypertension Continue home

## 2023-03-17 NOTE — Progress Notes (Addendum)
Reviewed discharge instructions with pt.pt verbalized understanding. Pt discharged with all personal belongings. Staff wheeled pt out. Pt transported to home via family car.

## 2023-03-17 NOTE — Discharge Instructions (Signed)
 While on the antibiotic linezolid avoid or minimize following foods and drinks as they may interact with linezolid.  These foods and drinks contain tyramine.  Tyramine is a natural product found in some plants and animals.  Too much tyramine in combination with linezolid can cause high levels of serotonin in the body.  Serotonin is a chemical in our body that controls mood, sleep, digestion, and other functions.  Several signs of too much serotonin in the body (also called serotonin syndrome) are fast heart rate, sweating, fevers, high blood pressure, muscle twitching, and confusion.  It is important to seek medical attention if you have these symptoms.   Avoid foods and beverages that are high in tyramine while taking linezolid, including: Alcoholic beverages (such as beer, red wine, vermouth, sherry) Aged cheeses (such as cheddar, blue cheese, swiss, feta, parmesan, camembert) Fermented or pickled foods (such as sauerkraut, pickled beets, pickled peppers, pickled cucumbers/pickles, kim chee/kimchi)  Dried/aged, smoked or processed meats and sausages (such as salami, pepperoni, liverwurst, hot dogs, bologna, bacon) Soybean products (such as soy sauce, tofu) Preserved fish (such as pickled herring) Products that contain large amounts of yeast (such as bouillon cubes, powdered soup/gravy, homemade or sourdough bread)  Broad/fava beans  Following foods are OK to eat while taking linezolid: Pasteurized cheeses (such as [place], ricotta, cottage cheese, cream cheese) Vegetables (not fermented or pickled) Non-cured or smoked meats

## 2023-03-17 NOTE — Progress Notes (Addendum)
Date of Admission:  03/06/2023      ID: Paula Garrett is a 57 y.o. 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 female with a history of gastric bypass, Multiple myeloma, hypothyroidism.had chest pain and sob in Aug 2024 and found to have a large soft tissue mass Left T6-T7 paraspinal area with destruction 7th rib and invasion of T6-T7 vertebral bodiesT underwent T4-T9 fusion and T6-T7 laminotomy ( pathology plasma cell neoplasm). anemia, OSA, hypothyroidism, COPD, last admission to [facility] 02/23/23-02/27/23 for hypoxic, hypercapneic resp failure, pneunmonia, left pleural effusion and underwent thoracentesis and culture neg, sent on augmentin and home oxygen presented to the Onc clinic on 9/19 with SOB abd sats in 70s and was sent ot the ED   Subjective: Pt feeling better Ambulated SOB better  Cough present Medications:   amLODipine  10 mg Oral Daily   clonazePAM  1 mg Oral BID   docusate sodium  100 mg Oral BID   escitalopram  10 mg Oral Daily   fentaNYL  1 patch Transdermal Q72H   gabapentin  100 mg Oral TID   ipratropium-albuterol  3 mL Nebulization TID   levothyroxine  250 mcg Oral QAC breakfast   linezolid  600 mg Oral Q12H   mometasone-formoterol  2 puff Inhalation BID   pantoprazole  40 mg Oral BID AC   polyethylene glycol  17 g Oral Daily   senna-docusate  1 tablet Oral  BID   sodium bicarbonate  650 mg Oral TID   thiamine  100 mg Oral Daily   umeclidinium bromide  1 puff Inhalation Daily    Objective: Vital signs in last 24 hours: Patient Vitals for the past 24 hrs:  BP Temp Temp src Pulse Resp SpO2  03/17/23 0744 (!) 148/80 -- -- -- -- --  03/17/23 0730 -- -- -- -- -- 95 %  03/17/23 0727 (!) 160/80 97.9 F (36.6 C) Oral 66 18 95 %  03/17/23 0019 124/61 98.1 F (36.7 C) -- 68 20 94 %  03/16/23 2042 -- -- -- -- -- 92 %  03/16/23 1536 (!) 144/70 98 F (36.7 C) -- 68 16 94 %  03/16/23 1343 -- -- -- -- -- 94 %     PHYSICAL EXAM:  General: Alert, cooperative, no distress, appears stated age. pale Lungsb/l decreased air entry bases Heart: Regular rate and rhythm, no murmur, rub or gallop. Abdomen: Soft, non-tender,not distended. Bowel sounds normal. No masses Extremities: atraumatic, no cyanosis. No edema. No clubbing Skin: No rashes or lesions. Or bruising Lymph: Cervical, supraclavicular normal. Neurologic: Grossly non-focal Surgical scar back- healed  Lab Results    Latest Ref Rng & Units 03/17/2023    6:05 AM 03/16/2023    3:41 AM 03/15/2023    4:56 AM  CBC  WBC 4.0 - 10.5 K/uL 6.1  6.1  8.7  Hemoglobin 12.0 - 15.0 g/dL 7.6  8.0  7.7   Hematocrit 36.0 - 46.0 % 23.9  24.6  24.1   Platelets 150 - 400 K/uL 247  260  261        Latest Ref Rng & Units 03/17/2023    6:05 AM 03/16/2023    3:41 AM 03/15/2023    4:56 AM  CMP  Glucose 70 - 99 mg/dL 83  161  87   BUN 6 - 20 mg/dL 9  9  12    Creatinine 0.44 - 1.00 mg/dL 0.96  0.45  4.09   Sodium 135 - 145 mmol/L 140  140  140   Potassium 3.5 - 5.1 mmol/L 4.0  3.3  3.6   Chloride 98 - 111 mmol/L 103  102  103   CO2 22 - 32 mmol/L 28  29  28    Calcium 8.9 - 10.3 mg/dL 9.0  9.1  8.9       Microbiology: Sputum culture neg Pleural fluid culutre X 2 negative BAL fluid pending Studies/Results:   CXR from today- improved aeration of left lung compared to 9/19 Assessment/Plan:  Acute  hypoxic respiratory failure secondary to left loculated pleural effusion.  Lymphocytic predominance.  Thoracentesis done on 03/09/2023 showed monocytic lymphocytic predominance.  Culture negative  .  On Zosyn and linezolid  MRSA pneumonia Sputum culture  MRSA BAL pending- 1K colonies of candida albicans Pro-Cal is < 0.10 Day 7 of MRSA antibiotic ( now on linezolid- will give for another 5-7 days Will not treat candida in the sputum as it is an oral organisms colonizing and doubt that is pneumonia due to that.  Multiple myeloma had received 2 doses of chemotherapy with Velcade and cyclophosphamide last.  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

## 2023-03-17 NOTE — Progress Notes (Signed)
OT Cancellation Note  Patient Details Name: KRYSTALYN KUBOTA MRN: [id number] DOB: 11/27/1965   Cancelled Treatment:    Reason Eval/Treat Not Completed: OT screened, no needs identified, will sign off. Chart reviewed - pt working with mobility. OT reviews role of OT in hospital; pt stating she is at her baseline. Pt reports walking to hospital entrance, and showering. No acute OT needs identified, OT will complete orders and sign off.   Lise Auer Janele Lague 03/17/2023, 9:58 AM

## 2023-03-17 NOTE — Progress Notes (Signed)
Mobility Specialist - Progress Note   03/17/23 0853  Mobility  Activity Ambulated independently in hallway;Stood at bedside;Dangled on edge of bed  Level of Assistance Independent  Assistive Device None  Distance Ambulated (ft) 160 ft  Activity Response Tolerated well  Mobility Referral Yes  $Mobility charge 1 Mobility  Mobility Specialist Start Time (ACUTE ONLY) 0844  Mobility Specialist Stop Time (ACUTE ONLY) 0851  Mobility Specialist Time Calculation (min) (ACUTE ONLY) 7 min   Pt asleep on 2L upon arrival. Pt awakens to voice and agreeable to mobility. Pt completes bed mobility, STS, and ambulates in hallway on 4L indep. Spo2 stays above 88 throughout. Pt returns to EOB with needs in reach.   Terrilyn Saver  Mobility Specialist  03/17/23 8:55 AM

## 2023-03-18 ENCOUNTER — Encounter: Payer: Self-pay | Admitting: Student in an Organized Health Care Education/Training Program

## 2023-03-18 ENCOUNTER — Ambulatory Visit: Payer: Managed Care, Other (non HMO)

## 2023-03-18 ENCOUNTER — Ambulatory Visit
Admission: RE | Admit: 2023-03-18 | Discharge: 2023-03-18 | Discharge status: Home / Self Care | Payer: Managed Care, Other (non HMO) | Source: Ambulatory Visit | Attending: Radiation Oncology | Admitting: Radiation Oncology

## 2023-03-18 ENCOUNTER — Other Ambulatory Visit: Payer: Self-pay

## 2023-03-18 ENCOUNTER — Ambulatory Visit: Payer: Managed Care, Other (non HMO) | Admitting: Student in an Organized Health Care Education/Training Program

## 2023-03-18 ENCOUNTER — Telehealth: Payer: Self-pay

## 2023-03-18 ENCOUNTER — Encounter: Payer: Self-pay | Admitting: Oncology

## 2023-03-18 VITALS — BP 146/80 | HR 71 | Temp 97.6°F | Ht 62.0 in | Wt 169.8 lb

## 2023-03-18 DIAGNOSIS — Z87891 Personal history of nicotine dependence: Secondary | ICD-10-CM | POA: Diagnosis not present

## 2023-03-18 DIAGNOSIS — Z23 Encounter for immunization: Secondary | ICD-10-CM | POA: Diagnosis not present

## 2023-03-18 DIAGNOSIS — Z800 Family history of malignant neoplasm of digestive organs: Secondary | ICD-10-CM | POA: Diagnosis not present

## 2023-03-18 DIAGNOSIS — R0602 Shortness of breath: Secondary | ICD-10-CM

## 2023-03-18 DIAGNOSIS — C9020 Extramedullary plasmacytoma not having achieved remission: Secondary | ICD-10-CM

## 2023-03-18 DIAGNOSIS — R7989 Other specified abnormal findings of blood chemistry: Secondary | ICD-10-CM | POA: Insufficient documentation

## 2023-03-18 DIAGNOSIS — Z9884 Bariatric surgery status: Secondary | ICD-10-CM | POA: Insufficient documentation

## 2023-03-18 DIAGNOSIS — D509 Iron deficiency anemia, unspecified: Secondary | ICD-10-CM | POA: Insufficient documentation

## 2023-03-18 DIAGNOSIS — J9621 Acute and chronic respiratory failure with hypoxia: Secondary | ICD-10-CM

## 2023-03-18 DIAGNOSIS — M549 Dorsalgia, unspecified: Secondary | ICD-10-CM | POA: Insufficient documentation

## 2023-03-18 DIAGNOSIS — C9000 Multiple myeloma not having achieved remission: Secondary | ICD-10-CM | POA: Insufficient documentation

## 2023-03-18 DIAGNOSIS — Z7982 Long term (current) use of aspirin: Secondary | ICD-10-CM | POA: Diagnosis not present

## 2023-03-18 DIAGNOSIS — E538 Deficiency of other specified B group vitamins: Secondary | ICD-10-CM | POA: Diagnosis not present

## 2023-03-18 DIAGNOSIS — Z8719 Personal history of other diseases of the digestive system: Secondary | ICD-10-CM | POA: Diagnosis not present

## 2023-03-18 DIAGNOSIS — Z5112 Encounter for antineoplastic immunotherapy: Secondary | ICD-10-CM | POA: Diagnosis present

## 2023-03-18 DIAGNOSIS — J15212 Pneumonia due to Methicillin resistant Staphylococcus aureus: Secondary | ICD-10-CM

## 2023-03-18 DIAGNOSIS — Z79899 Other long term (current) drug therapy: Secondary | ICD-10-CM | POA: Insufficient documentation

## 2023-03-18 DIAGNOSIS — Z79624 Long term (current) use of inhibitors of nucleotide synthesis: Secondary | ICD-10-CM | POA: Diagnosis not present

## 2023-03-18 DIAGNOSIS — F17210 Nicotine dependence, cigarettes, uncomplicated: Secondary | ICD-10-CM | POA: Insufficient documentation

## 2023-03-18 DIAGNOSIS — G893 Neoplasm related pain (acute) (chronic): Secondary | ICD-10-CM | POA: Diagnosis not present

## 2023-03-18 DIAGNOSIS — Z79891 Long term (current) use of opiate analgesic: Secondary | ICD-10-CM | POA: Diagnosis not present

## 2023-03-18 DIAGNOSIS — Z7984 Long term (current) use of oral hypoglycemic drugs: Secondary | ICD-10-CM | POA: Diagnosis not present

## 2023-03-18 LAB — RAD ONC ARIA SESSION SUMMARY
Course Elapsed Days: 1
Plan Fractions Treated to Date: 2
Plan Prescribed Dose Per Fraction: 3 Gy
Plan Total Fractions Prescribed: 10
Plan Total Prescribed Dose: 30 Gy
Reference Point Dosage Given to Date: 6 Gy
Reference Point Session Dosage Given: 3 Gy
Session Number: 2

## 2023-03-18 NOTE — Transitions of Care (Post Inpatient/ED Visit) (Signed)
03/18/2023  Name: Paula Garrett MRN: [id number] DOB: [birth year]  Today's TOC FU Call Status: Today's TOC FU Call Status:: Successful TOC FU Call Completed TOC FU Call Complete Date: 03/18/23 Patient's Name and Date of Birth confirmed.  Transition Care Management Follow-up Telephone Call Date of Discharge: 03/17/23 Discharge Facility: [facility]) Type of Discharge: Inpatient Admission Primary Inpatient Discharge Diagnosis:: Recurrent LEFT Plueral Effusion How have you been since you were released from the hospital?: Better Any questions or concerns?: No  Items Reviewed: Did you receive and understand the discharge instructions provided?: Yes Medications obtained,verified, and reconciled?: Yes (Medications Reviewed) Any new allergies since your discharge?: No Dietary orders reviewed?: No Do you have support at home?: Yes People in Home: alone Name of Support/Comfort Primary Source: Brother, Paula Garrett  Medications Reviewed Today: Medications Reviewed Today     Reviewed by Paula Quarry, RN (Case Manager) on 03/18/23 at 1339  Med List Status: <None>   Medication Order Taking? Sig Documenting Provider Last Dose Status Informant  albuterol (VENTOLIN HFA) 108 (90 Base) MCG/ACT inhaler [id number] No Inhale 2 puffs into the lungs every 6 (six) hours as needed for wheezing or shortness of breath.  Patient not taking: Reported on 03/04/2023   Paula Finner, MD Not Taking Active Other  allopurinol (ZYLOPRIM) 100 MG tablet [id number] No Take 1 tablet (100 mg total) by mouth daily.  Patient not taking: Reported on 03/04/2023   Paula Priestly, MD Not Taking Active Other  amLODipine (NORVASC) 10 MG tablet [id number] Yes Take 1 tablet (10 mg total) by mouth daily. Paula Dies, NP Taking Active Other  aspirin EC 81 MG tablet [id number] Yes Take 1 tablet (81 mg total) by mouth daily. Swallow whole. Paula Priestly, MD Taking Active Other  butalbital-acetaminophen-caffeine  (FIORICET) 2407192824 MG tablet [id number] Yes Take 1 tablet by mouth every 4 (four) hours as needed for headache. Paula Priestly, MD Taking Active Other  clonazePAM [facility]) 1 MG tablet [id number] Yes TAKE 1 TABLET(1 MG) BY MOUTH TWICE DAILY Paula Garrett, Paula Neat, DO Taking Active   cyanocobalamin (VITAMIN B12) 1000 MCG/ML injection [id number] No INJECT 1 ML IN THE MUSCLE EVERY 30 DAYS  Patient not taking: Reported on 03/04/2023   Paula Garrett, Paula Spillers, MD Not Taking Active Other           Med Note [facility], TIFFANY A   Fri Jan 24, 2023  2:35 PM) Hasn't had in over a month but needs to have it  cyclobenzaprine (FLEXERIL) 10 MG tablet [id number] Yes Take 0.5-1 tablets (5-10 mg total) by mouth 3 (three) times daily as needed for muscle spasms. Paula Cords, DO Taking Active Other  dexamethasone (DECADRON) 4 MG tablet [id number] Yes Take 10 tablets (40 mg) on Day 15 of each cycle. Repeat every 21 days.Take with breakfast. Paula Hines, MD Taking Active Other  docusate sodium (COLACE) 100 MG capsule [id number] Yes Take 1 capsule (100 mg total) by mouth 2 (two) times daily. Paula Finner, MD Taking Active Other  escitalopram (LEXAPRO) 10 MG tablet [id number] Yes Take 1 tablet (10 mg total) by mouth daily. Paula Cords, DO Taking Active Other  fentaNYL (DURAGESIC) 50 MCG/HR [id number] Yes Place 1 patch onto the skin every 3 (three) days. Paula Garrett, Paula Eastern, NP Taking Active   gabapentin (NEURONTIN) 100 MG capsule [id number] Yes Take 1 capsule (100 mg total) by mouth 3 (three) times daily. Paula Finner, MD Taking Active Other  HYDROmorphone (DILAUDID) 2 MG tablet [id number] Yes Take 1-2 tablets (  2-4 mg total) by mouth every 6 (six) hours as needed for severe pain. Paula Garrett, Paula Eastern, NP Taking Active   ipratropium-albuterol (DUONEB) 0.5-2.5 (3) MG/3ML SOLN [id number] Yes Take 3 mLs by nebulization 4 (four) times daily. Paula Nielsen, DO Taking Active Other  levothyroxine (SYNTHROID) 125 MCG  tablet [id number] Yes Take 2 tablets (250 mcg total) by mouth daily before breakfast. Paula Dys, MD Taking Active   linezolid (ZYVOX) 600 MG tablet [id number] Yes Take 1 tablet (600 mg total) by mouth every 12 (twelve) hours. Paula Dys, MD Taking Active   mometasone-formoterol [facility]) 200-5 MCG/ACT Sandrea Matte [id number] Yes Inhale 2 puffs into the lungs 2 (two) times daily. Paula Finner, MD Taking Active Other  ondansetron [facility]) 8 MG tablet [id number] Yes Take 1 tablet (8 mg total) by mouth 2 (two) times daily as needed for nausea or vomiting. Start on day 3 after Cytoxan. Paula Hines, MD Taking Active Other  pantoprazole (PROTONIX) 40 MG tablet [id number]  30 min before lunch or dinner Paula Garrett, Paula Spillers, MD  Active Other  polyethylene glycol (MIRALAX / GLYCOLAX) 17 g packet [id number] Yes Take 17 g by mouth 2 (two) times daily. Paula Finner, MD Taking Active Other  prochlorperazine (COMPAZINE) 10 MG tablet [id number] Yes Take 1 tablet (10 mg total) by mouth every 6 (six) hours as needed for nausea or vomiting. Paula Hines, MD Taking Active Other  senna-docusate (SENOKOT-S) 8.6-50 MG tablet [id number] Yes Take 1 tablet by mouth 2 (two) times daily. Paula Dys, MD Taking Active   sodium bicarbonate 650 MG tablet [id number] Yes Take 1 tablet (650 mg total) by mouth 3 (three) times daily. Paula Priestly, MD Taking Active Other  Spacer/Aero-Holding Chambers (AEROCHAMBER MV) inhaler [id number] Yes Use as instructed Paula Chute, MD Taking Active Other  SYRINGE-NEEDLE, DISP, 3 ML 25G X 1-1/2" 3 ML MISC [id number] No 1 Device by Does not apply route every 30 (thirty) days. With B12 shot  Patient not taking: Reported on 03/18/2023   Paula Garrett, Paula Spillers, MD Not Taking Active Other  thiamine (VITAMIN B-1) 100 MG tablet [id number] Yes Take 1 tablet (100 mg total) by mouth daily. Paula Dys, MD Taking Active   umeclidinium bromide (INCRUSE ELLIPTA) 62.5 MCG/ACT AEPB [id number] Yes Inhale 1 puff into  the lungs daily. Paula Finner, MD Taking Active Other  Vitamin D, Ergocalciferol, (DRISDOL) 1.25 MG (50000 UNIT) CAPS capsule [id number] No Take 50,000 Units by mouth every 7 (seven) days.  Patient not taking: Reported on 03/18/2023   [provider] Not Taking Active Other           Med Note Judeth Horn   Mon Feb 10, 2023  3:20 PM) Been about 3 weeks since last dose, need refills  zolpidem (AMBIEN) 10 MG tablet [id number] Yes TAKE 1/2 TO 1 TABLET(5 TO 10 MG) BY MOUTH AT BEDTIME AS NEEDED FOR SLEEP Paula Garrett Paula Neat, DO Taking Active             Home Care and Equipment/Supplies: Were Home Health Services Ordered?: Yes Name of Home Health Agency:: Patient Declined Services Has Agency set up a time to come to your home?: No EMR reviewed for Home Health Orders: Home Health Not Ordered Any new equipment or medical supplies ordered?: Yes Name of Medical supply agency?: [employer] Were you able to get the equipment/medical supplies?: Yes  Functional Questionnaire: Do you need assistance with bathing/showering or dressing?: Yes Do you need assistance with meal preparation?: No Do you  need assistance with eating?: No Do you have difficulty maintaining continence: No Do you need assistance with getting out of bed/getting out of a chair/moving?: No Do you have difficulty managing or taking your medications?: No  Follow up appointments reviewed: PCP Follow-up appointment confirmed?: NA (Pt has appointment scheduled with PCP on 05/05/23) Specialist Hospital Follow-up appointment confirmed?: Yes Date of Specialist follow-up appointment?: 03/18/23 Follow-Up Specialty Provider:: Colvin Caroli Dgalyi/ [id number] Dr Howell Pringle Do you need transportation to your follow-up appointment?: No Do you understand care options if your condition(s) worsen?: Yes-patient verbalized understanding  SDOH Interventions Today    Flowsheet Row Most Recent Value  SDOH Interventions   Food Insecurity  Interventions Intervention Not Indicated  Housing Interventions Intervention Not Indicated  Transportation Interventions Intervention Not Indicated, Patient Resources (Friends/Family)  Utilities Interventions Intervention Not Indicated      Patient declined Care Coordination Services at this time.   Gabriel Cirri RN MSN  [employer] [facility]  [facility] Email: [email or web address] Direct Dial:[phone]

## 2023-03-18 NOTE — Progress Notes (Unsigned)
Synopsis: Referred in *** by Kara Dies, NP  Assessment & Plan:   There are no diagnoses linked to this encounter.  Return in about 2 weeks (around 04/01/2023).  I spent *** minutes caring for this patient today, including {EM billing:[id number]}  Raechel Chute, MD [facility] 03/18/2023 6:22 PM    End of visit medications:  No orders of the defined types were placed in this encounter.    Current Outpatient Medications:    albuterol (VENTOLIN HFA) 108 (90 Base) MCG/ACT inhaler, Inhale 2 puffs into the lungs every 6 (six) hours as needed for wheezing or shortness of breath., Disp: 18 g, Rfl: 11   allopurinol (ZYLOPRIM) 100 MG tablet, Take 1 tablet (100 mg total) by mouth daily., Disp: 30 tablet, Rfl: 0   amLODipine (NORVASC) 10 MG tablet, Take 1 tablet (10 mg total) by mouth daily., Disp: 90 tablet, Rfl: 3   aspirin EC 81 MG tablet, Take 1 tablet (81 mg total) by mouth daily. Swallow whole., Disp: 30 tablet, Rfl: 2   butalbital-acetaminophen-caffeine (FIORICET) 50-325-40 MG tablet, Take 1 tablet by mouth every 4 (four) hours as needed for headache., Disp: 14 tablet, Rfl: 0   clonazePAM (KLONOPIN) 1 MG tablet, TAKE 1 TABLET(1 MG) BY MOUTH TWICE DAILY, Disp: 60 tablet, Rfl: 2   cyanocobalamin (VITAMIN B12) 1000 MCG/ML injection, INJECT 1 ML IN THE MUSCLE EVERY 30 DAYS, Disp: 4 mL, Rfl: 12   cyclobenzaprine (FLEXERIL) 10 MG tablet, Take 0.5-1 tablets (5-10 mg total) by mouth 3 (three) times daily as needed for muscle spasms., Disp: 60 tablet, Rfl: 1   dexamethasone (DECADRON) 4 MG tablet, Take 10 tablets (40 mg) on Day 15 of each cycle. Repeat every 21 days.Take with breakfast., Disp: 10 tablet, Rfl: 3   docusate sodium (COLACE) 100 MG capsule, Take 1 capsule (100 mg total) by mouth 2 (two) times daily., Disp: 10 capsule, Rfl: 0   escitalopram (LEXAPRO) 10 MG tablet, Take 1 tablet (10 mg total) by mouth daily., Disp: 30 tablet, Rfl: 2   fentaNYL (DURAGESIC) 50  MCG/HR, Place 1 patch onto the skin every 3 (three) days., Disp: 5 patch, Rfl: 0   gabapentin (NEURONTIN) 100 MG capsule, Take 1 capsule (100 mg total) by mouth 3 (three) times daily., Disp: 30 capsule, Rfl: 0   HYDROmorphone (DILAUDID) 2 MG tablet, Take 1-2 tablets (2-4 mg total) by mouth every 6 (six) hours as needed for severe pain., Disp: 60 tablet, Rfl: 0   ipratropium-albuterol (DUONEB) 0.5-2.5 (3) MG/3ML SOLN, Take 3 mLs by nebulization 4 (four) times daily., Disp: 360 mL, Rfl: 0   levothyroxine (SYNTHROID) 125 MCG tablet, Take 2 tablets (250 mcg total) by mouth daily before breakfast., Disp: 60 tablet, Rfl: 0   linezolid (ZYVOX) 600 MG tablet, Take 1 tablet (600 mg total) by mouth every 12 (twelve) hours., Disp: 10 tablet, Rfl: 0   mometasone-formoterol (DULERA) 200-5 MCG/ACT AERO, Inhale 2 puffs into the lungs 2 (two) times daily., Disp: 1 each, Rfl: 2   ondansetron (ZOFRAN) 8 MG tablet, Take 1 tablet (8 mg total) by mouth 2 (two) times daily as needed for nausea or vomiting. Start on day 3 after Cytoxan., Disp: 30 tablet, Rfl: 1   pantoprazole (PROTONIX) 40 MG tablet, 30 min before lunch or dinner, Disp: 90 tablet, Rfl: 3   polyethylene glycol (MIRALAX / GLYCOLAX) 17 g packet, Take 17 g by mouth 2 (two) times daily., Disp: 14 each, Rfl: 0   prochlorperazine (COMPAZINE) 10 MG  tablet, Take 1 tablet (10 mg total) by mouth every 6 (six) hours as needed for nausea or vomiting., Disp: 30 tablet, Rfl: 1   senna-docusate (SENOKOT-S) 8.6-50 MG tablet, Take 1 tablet by mouth 2 (two) times daily., Disp: 30 tablet, Rfl: 0   sodium bicarbonate 650 MG tablet, Take 1 tablet (650 mg total) by mouth 3 (three) times daily., Disp: 90 tablet, Rfl: 0   Spacer/Aero-Holding Chambers (AEROCHAMBER MV) inhaler, Use as instructed, Disp: 1 each, Rfl: 0   SYRINGE-NEEDLE, DISP, 3 ML 25G X 1-1/2" 3 ML MISC, 1 Device by Does not apply route every 30 (thirty) days. With B12 shot, Disp: 12 each, Rfl: 1   thiamine (VITAMIN  B-1) 100 MG tablet, Take 1 tablet (100 mg total) by mouth daily., Disp: 30 tablet, Rfl: 0   umeclidinium bromide (INCRUSE ELLIPTA) 62.5 MCG/ACT AEPB, Inhale 1 puff into the lungs daily., Disp: 30 each, Rfl: 0   Vitamin D, Ergocalciferol, (DRISDOL) 1.25 MG (50000 UNIT) CAPS capsule, Take 50,000 Units by mouth every 7 (seven) days., Disp: , Rfl:    zolpidem (AMBIEN) 10 MG tablet, TAKE 1/2 TO 1 TABLET(5 TO 10 MG) BY MOUTH AT BEDTIME AS NEEDED FOR SLEEP, Disp: 30 tablet, Rfl: 2   Subjective:   PATIENT ID: Paula Garrett GENDER: female DOB: Jan 12, 1966, MRN: [id number]  Chief Complaint  Patient presents with   Follow-up    Shortness of breath on exertion. No cough or wheezing.     HPI  Post hospital follow up, had pleural effusion, drained with chest tube, MRSA on culture. On [place] today, right side and left side with trace effusions, will continue to monitor. Radiatio and chemotherapy started.   POCUS of the R. Lung    POCUS of the L Lung    Ancillary information including prior medications, full medical/surgical/family/social histories, and PFTs (when available) are listed below and have been reviewed.   ROS   Objective:   Vitals:   03/18/23 1554  BP: (!) 146/80  Pulse: 71  Temp: 97.6 F (36.4 C)  TempSrc: Temporal  SpO2: 96%  Weight: 169 lb 12.8 oz (77 kg)  Height: 5\' 2"  (1.575 m)   96% on *** LPM *** RA BMI Readings from Last 3 Encounters:  03/18/23 31.06 kg/m  03/06/23 37.38 kg/m  03/04/23 38.57 kg/m   Wt Readings from Last 3 Encounters:  03/18/23 169 lb 12.8 oz (77 kg)  03/06/23 204 lb 5.9 oz (92.7 kg)  03/04/23 210 lb 14.4 oz (95.7 kg)    Physical Exam    Ancillary Information    Past Medical History:  Diagnosis Date   Anxiety    Arthritis    Asthma    Barrett's esophagus 2015   COVID-19    03/10/20   Depression    GERD (gastroesophageal reflux disease)    Hypertension    Hypothyroidism    Pneumonia    Smoldering myeloma    Thyroid disease       Family History  Problem Relation Age of Onset   Heart disease Mother    COPD Mother    Arthritis Mother    Cancer Mother        uterine   Lupus Mother    Anxiety disorder Mother    Depression Mother    Drug abuse Mother    COPD Father    Arthritis Father    Heart disease Father    Heart attack Father    Alcohol abuse Father    Heart  disease Brother    Heart attack Brother    Drug abuse Brother    Anxiety disorder Brother    Depression Brother    Heart disease Brother    Cancer Brother        liver cancer age 27 y.o   Diabetes Maternal Grandmother    Diabetes Maternal Grandfather    Diabetes Paternal Grandmother    Diabetes Paternal Grandfather    Breast cancer Neg Hx      Past Surgical History:  Procedure Laterality Date   ABLATION  2006   APPLICATION OF INTRAOPERATIVE CT SCAN N/A 01/28/2023   Procedure: APPLICATION OF INTRAOPERATIVE CT SCAN;  Surgeon: Loreen Freud, MD;  Location: [facility] ORS;  Service: Neurosurgery;  Laterality: N/A;   BREAST CYST ASPIRATION Left 1998   BREAST SURGERY     cyst aspiration   BREAST SURGERY  1998   cyst aspiration   COLONOSCOPY  03/2014   COLONOSCOPY WITH PROPOFOL N/A 01/06/2020   Procedure: COLONOSCOPY WITH PROPOFOL;  Surgeon: Toledo, Boykin Nearing, MD;  Location: [facility] ENDOSCOPY;  Service: Gastroenterology;  Laterality: N/A;   ENDOMETRIAL ABLATION     ESOPHAGOGASTRODUODENOSCOPY (EGD) WITH PROPOFOL N/A 08/15/2015   Procedure: ESOPHAGOGASTRODUODENOSCOPY (EGD) WITH PROPOFOL;  Surgeon: Christena Deem, MD;  Location: [facility] ENDOSCOPY;  Service: Endoscopy;  Laterality: N/A;   ESOPHAGOGASTRODUODENOSCOPY (EGD) WITH PROPOFOL N/A 01/22/2016   Procedure: ESOPHAGOGASTRODUODENOSCOPY (EGD) WITH PROPOFOL;  Surgeon: Christena Deem, MD;  Location: [facility] ENDOSCOPY;  Service: Endoscopy;  Laterality: N/A;   ESOPHAGOGASTRODUODENOSCOPY (EGD) WITH PROPOFOL N/A 01/06/2020   Procedure: ESOPHAGOGASTRODUODENOSCOPY (EGD) WITH PROPOFOL;  Surgeon: Toledo,  Boykin Nearing, MD;  Location: [facility] ENDOSCOPY;  Service: Gastroenterology;  Laterality: N/A;   FLEXIBLE BRONCHOSCOPY Bilateral 03/13/2023   Procedure: FLEXIBLE BRONCHOSCOPY;  Surgeon: Erin Fulling, MD;  Location: [facility] ORS;  Service: Cardiopulmonary;  Laterality: Bilateral;   GASTRIC BYPASS  2005   HERNIA REPAIR     IR BONE MARROW BIOPSY & ASPIRATION  02/05/2023   NASAL SINUS SURGERY     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   TOTAL THYROIDECTOMY     TUBAL LIGATION      Social History   Socioeconomic History   Marital status: Divorced    Spouse name: Not on file   Number of children: 2   Years of education: Not on file   Highest education level: GED or equivalent  Occupational History   Occupation: Part-time  Tobacco Use   Smoking status: Former    Types: Cigarettes   Smokeless tobacco: Never   Tobacco comments:    Quit smoking 02/16/2023  Vaping Use   Vaping status: Never Used  Substance and Sexual Activity   Alcohol use: Yes    Alcohol/week: 10.0 - 12.0 standard drinks of alcohol    Types: 8 - 10 Glasses of wine, 2 Standard drinks or equivalent per week   Drug use: No   Sexual activity: Yes    Partners: Male  Other Topics Concern   Not on file  Social History Narrative   Lives in [place] single, divorced       Work - 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       Diet - healthy diet   Exercise - limited   Caffeine use: daily   Social Determinants of Health   Financial Resource Strain: Low Risk  (11/01/2022)   Overall Financial Resource Strain (CARDIA)    Difficulty of Paying Living Expenses: Not very hard  Food Insecurity: No Food  Insecurity (03/18/2023)   Hunger Vital Sign    Worried About Running Out of Food in the Last Year: Never true    Ran Out of Food in the Last Year: Never true  Transportation Needs: No Transportation Needs (03/18/2023)   PRAPARE - Administrator, Civil Service (Medical): No    Lack of Transportation (Non-Medical): No  Physical  Activity: Insufficiently Active (11/01/2022)   Exercise Vital Sign    Days of Exercise per Week: 3 days    Minutes of Exercise per Session: 20 min  Stress: Stress Concern Present (03/04/2023)   [employer] of Occupational Health - Occupational Stress Questionnaire    Feeling of Stress : Very much  Social Connections: Socially Isolated (03/04/2023)   Social Connection and Isolation Panel [NHANES]    Frequency of Communication with Friends and Family: Never    Frequency of Social Gatherings with Friends and Family: Never    Attends Religious Services: Never    Database administrator or Organizations: No    Attends Banker Meetings: Never    Marital Status: Divorced  Catering manager Violence: Not At Risk (03/06/2023)   Humiliation, Afraid, Rape, and Kick questionnaire    Fear of Current or Ex-Partner: No    Emotionally Abused: No    Physically Abused: No    Sexually Abused: No     Allergies  Allergen Reactions   Hctz [Hydrochlorothiazide]     Hyponatremia      CBC    Component Value Date/Time   WBC 6.1 03/17/2023 0605   RBC 2.60 (L) 03/17/2023 0605   HGB 7.6 (L) 03/17/2023 0605   HGB 7.4 (L) 02/10/2023 2009   HCT 23.9 (L) 03/17/2023 0605   HCT 38.5 07/02/2011 1529   PLT 247 03/17/2023 0605   PLT 311 07/02/2011 1529   MCV 91.9 03/17/2023 0605   MCV 90 07/02/2011 1529   MCH 29.2 03/17/2023 0605   MCHC 31.8 03/17/2023 0605   RDW 14.7 03/17/2023 0605   RDW 13.4 07/02/2011 1529   LYMPHSABS 1.9 03/17/2023 0605   MONOABS 0.7 03/17/2023 0605   EOSABS 0.0 03/17/2023 0605   BASOSABS 0.1 03/17/2023 0605    Pulmonary Functions Testing Results:     No data to display          Outpatient Medications Prior to Visit  Medication Sig Dispense Refill   albuterol (VENTOLIN HFA) 108 (90 Base) MCG/ACT inhaler Inhale 2 puffs into the lungs every 6 (six) hours as needed for wheezing or shortness of breath. 18 g 11   allopurinol (ZYLOPRIM) 100 MG tablet Take 1  tablet (100 mg total) by mouth daily. 30 tablet 0   amLODipine (NORVASC) 10 MG tablet Take 1 tablet (10 mg total) by mouth daily. 90 tablet 3   aspirin EC 81 MG tablet Take 1 tablet (81 mg total) by mouth daily. Swallow whole. 30 tablet 2   butalbital-acetaminophen-caffeine (FIORICET) 50-325-40 MG tablet Take 1 tablet by mouth every 4 (four) hours as needed for headache. 14 tablet 0   clonazePAM (KLONOPIN) 1 MG tablet TAKE 1 TABLET(1 MG) BY MOUTH TWICE DAILY 60 tablet 2   cyanocobalamin (VITAMIN B12) 1000 MCG/ML injection INJECT 1 ML IN THE MUSCLE EVERY 30 DAYS 4 mL 12   cyclobenzaprine (FLEXERIL) 10 MG tablet Take 0.5-1 tablets (5-10 mg total) by mouth 3 (three) times daily as needed for muscle spasms. 60 tablet 1   dexamethasone (DECADRON) 4 MG tablet Take 10 tablets (40 mg) on  Day 15 of each cycle. Repeat every 21 days.Take with breakfast. 10 tablet 3   docusate sodium (COLACE) 100 MG capsule Take 1 capsule (100 mg total) by mouth 2 (two) times daily. 10 capsule 0   escitalopram (LEXAPRO) 10 MG tablet Take 1 tablet (10 mg total) by mouth daily. 30 tablet 2   fentaNYL (DURAGESIC) 50 MCG/HR Place 1 patch onto the skin every 3 (three) days. 5 patch 0   gabapentin (NEURONTIN) 100 MG capsule Take 1 capsule (100 mg total) by mouth 3 (three) times daily. 30 capsule 0   HYDROmorphone (DILAUDID) 2 MG tablet Take 1-2 tablets (2-4 mg total) by mouth every 6 (six) hours as needed for severe pain. 60 tablet 0   ipratropium-albuterol (DUONEB) 0.5-2.5 (3) MG/3ML SOLN Take 3 mLs by nebulization 4 (four) times daily. 360 mL 0   levothyroxine (SYNTHROID) 125 MCG tablet Take 2 tablets (250 mcg total) by mouth daily before breakfast. 60 tablet 0   linezolid (ZYVOX) 600 MG tablet Take 1 tablet (600 mg total) by mouth every 12 (twelve) hours. 10 tablet 0   mometasone-formoterol (DULERA) 200-5 MCG/ACT AERO Inhale 2 puffs into the lungs 2 (two) times daily. 1 each 2   ondansetron (ZOFRAN) 8 MG tablet Take 1 tablet (8 mg  total) by mouth 2 (two) times daily as needed for nausea or vomiting. Start on day 3 after Cytoxan. 30 tablet 1   pantoprazole (PROTONIX) 40 MG tablet 30 min before lunch or dinner 90 tablet 3   polyethylene glycol (MIRALAX / GLYCOLAX) 17 g packet Take 17 g by mouth 2 (two) times daily. 14 each 0   prochlorperazine (COMPAZINE) 10 MG tablet Take 1 tablet (10 mg total) by mouth every 6 (six) hours as needed for nausea or vomiting. 30 tablet 1   senna-docusate (SENOKOT-S) 8.6-50 MG tablet Take 1 tablet by mouth 2 (two) times daily. 30 tablet 0   sodium bicarbonate 650 MG tablet Take 1 tablet (650 mg total) by mouth 3 (three) times daily. 90 tablet 0   Spacer/Aero-Holding Chambers (AEROCHAMBER MV) inhaler Use as instructed 1 each 0   SYRINGE-NEEDLE, DISP, 3 ML 25G X 1-1/2" 3 ML MISC 1 Device by Does not apply route every 30 (thirty) days. With B12 shot 12 each 1   thiamine (VITAMIN B-1) 100 MG tablet Take 1 tablet (100 mg total) by mouth daily. 30 tablet 0   umeclidinium bromide (INCRUSE ELLIPTA) 62.5 MCG/ACT AEPB Inhale 1 puff into the lungs daily. 30 each 0   Vitamin D, Ergocalciferol, (DRISDOL) 1.25 MG (50000 UNIT) CAPS capsule Take 50,000 Units by mouth every 7 (seven) days.     zolpidem (AMBIEN) 10 MG tablet TAKE 1/2 TO 1 TABLET(5 TO 10 MG) BY MOUTH AT BEDTIME AS NEEDED FOR SLEEP 30 tablet 2   No facility-administered medications prior to visit.

## 2023-03-19 ENCOUNTER — Ambulatory Visit
Admission: RE | Admit: 2023-03-19 | Discharge: 2023-03-19 | Disposition: A | Discharge status: Home / Self Care | Payer: Managed Care, Other (non HMO) | Source: Ambulatory Visit | Attending: Radiation Oncology | Admitting: Radiation Oncology

## 2023-03-19 ENCOUNTER — Other Ambulatory Visit: Payer: Self-pay

## 2023-03-19 ENCOUNTER — Ambulatory Visit: Payer: Managed Care, Other (non HMO)

## 2023-03-19 ENCOUNTER — Inpatient Hospital Stay: Payer: Managed Care, Other (non HMO) | Attending: Oncology | Admitting: Occupational Therapy

## 2023-03-19 DIAGNOSIS — D509 Iron deficiency anemia, unspecified: Secondary | ICD-10-CM | POA: Insufficient documentation

## 2023-03-19 DIAGNOSIS — E538 Deficiency of other specified B group vitamins: Secondary | ICD-10-CM | POA: Insufficient documentation

## 2023-03-19 DIAGNOSIS — M549 Dorsalgia, unspecified: Secondary | ICD-10-CM | POA: Insufficient documentation

## 2023-03-19 DIAGNOSIS — Z5112 Encounter for antineoplastic immunotherapy: Secondary | ICD-10-CM | POA: Insufficient documentation

## 2023-03-19 DIAGNOSIS — R0602 Shortness of breath: Secondary | ICD-10-CM | POA: Insufficient documentation

## 2023-03-19 DIAGNOSIS — G893 Neoplasm related pain (acute) (chronic): Secondary | ICD-10-CM | POA: Insufficient documentation

## 2023-03-19 DIAGNOSIS — Z8719 Personal history of other diseases of the digestive system: Secondary | ICD-10-CM | POA: Insufficient documentation

## 2023-03-19 DIAGNOSIS — Z79899 Other long term (current) drug therapy: Secondary | ICD-10-CM | POA: Insufficient documentation

## 2023-03-19 DIAGNOSIS — Z87891 Personal history of nicotine dependence: Secondary | ICD-10-CM | POA: Insufficient documentation

## 2023-03-19 DIAGNOSIS — Z7984 Long term (current) use of oral hypoglycemic drugs: Secondary | ICD-10-CM | POA: Insufficient documentation

## 2023-03-19 DIAGNOSIS — Z9884 Bariatric surgery status: Secondary | ICD-10-CM | POA: Insufficient documentation

## 2023-03-19 DIAGNOSIS — Z7982 Long term (current) use of aspirin: Secondary | ICD-10-CM | POA: Insufficient documentation

## 2023-03-19 DIAGNOSIS — Z79891 Long term (current) use of opiate analgesic: Secondary | ICD-10-CM | POA: Insufficient documentation

## 2023-03-19 DIAGNOSIS — R7989 Other specified abnormal findings of blood chemistry: Secondary | ICD-10-CM | POA: Insufficient documentation

## 2023-03-19 DIAGNOSIS — C9000 Multiple myeloma not having achieved remission: Secondary | ICD-10-CM | POA: Insufficient documentation

## 2023-03-19 DIAGNOSIS — Z79624 Long term (current) use of inhibitors of nucleotide synthesis: Secondary | ICD-10-CM | POA: Insufficient documentation

## 2023-03-19 DIAGNOSIS — Z23 Encounter for immunization: Secondary | ICD-10-CM | POA: Insufficient documentation

## 2023-03-19 DIAGNOSIS — Z800 Family history of malignant neoplasm of digestive organs: Secondary | ICD-10-CM | POA: Insufficient documentation

## 2023-03-19 LAB — RAD ONC ARIA SESSION SUMMARY
Course Elapsed Days: 2
Plan Fractions Treated to Date: 3
Plan Prescribed Dose Per Fraction: 3 Gy
Plan Total Fractions Prescribed: 10
Plan Total Prescribed Dose: 30 Gy
Reference Point Dosage Given to Date: 9 Gy
Reference Point Session Dosage Given: 3 Gy
Session Number: 3

## 2023-03-19 LAB — CYTOLOGY - NON PAP

## 2023-03-19 NOTE — Therapy (Signed)
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   Insomnia 04/21/2012   Hypothyroidism 04/21/2012   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    Oletta Cohn, OTR/L,CLT 03/19/2023, 5:05 PM  [facility] [address] [place], [place], [address] Phone: [phone]   Fax:  [phone]  Name: Paula Garrett MRN: [id number] Date of Birth: 10/18/1965  [facility] [address] [place], [place], [address] Phone: [phone]   Fax:  [phone]  Occupational Therapy Screen  Patient Details  Name: Paula Garrett MRN: [id number] Date of Birth: 1965/08/20 No data recorded  Encounter Date: 03/19/2023   OT End of Session - 03/19/23 1704     Visit Number 0             Past Medical History:  Diagnosis Date   Anxiety    Arthritis    Asthma    Barrett's esophagus 2015   COVID-19    03/10/20   Depression    GERD (gastroesophageal reflux disease)    Hypertension    Hypothyroidism    Pneumonia    Smoldering myeloma    Thyroid disease     Past Surgical History:  Procedure Laterality Date   ABLATION  2006   APPLICATION OF INTRAOPERATIVE CT SCAN N/A 01/28/2023   Procedure: APPLICATION OF INTRAOPERATIVE CT SCAN;  Surgeon: Loreen Freud, MD;  Location: [facility] ORS;  Service: Neurosurgery;  Laterality: N/A;   BREAST CYST ASPIRATION Left 1998   BREAST SURGERY     cyst aspiration   BREAST SURGERY  1998   cyst aspiration   COLONOSCOPY  03/2014   COLONOSCOPY WITH PROPOFOL N/A 01/06/2020   Procedure: COLONOSCOPY WITH PROPOFOL;  Surgeon: Toledo, Boykin Nearing, MD;  Location: [facility] ENDOSCOPY;  Service: Gastroenterology;  Laterality: N/A;   ENDOMETRIAL ABLATION     ESOPHAGOGASTRODUODENOSCOPY (EGD) WITH PROPOFOL N/A 08/15/2015   Procedure: ESOPHAGOGASTRODUODENOSCOPY (EGD) WITH PROPOFOL;  Surgeon: Christena Deem, MD;  Location: [facility] ENDOSCOPY;  Service: Endoscopy;  Laterality: N/A;   ESOPHAGOGASTRODUODENOSCOPY (EGD) WITH PROPOFOL N/A 01/22/2016   Procedure: ESOPHAGOGASTRODUODENOSCOPY (EGD) WITH PROPOFOL;  Surgeon: Christena Deem, MD;  Location: [facility] ENDOSCOPY;  Service: Endoscopy;  Laterality: N/A;   ESOPHAGOGASTRODUODENOSCOPY (EGD) WITH PROPOFOL N/A 01/06/2020   Procedure: ESOPHAGOGASTRODUODENOSCOPY (EGD) WITH PROPOFOL;  Surgeon: Toledo, Boykin Nearing, MD;  Location: [facility]  ENDOSCOPY;  Service: Gastroenterology;  Laterality: N/A;   FLEXIBLE BRONCHOSCOPY Bilateral 03/13/2023   Procedure: FLEXIBLE BRONCHOSCOPY;  Surgeon: Erin Fulling, MD;  Location: [facility] ORS;  Service: Cardiopulmonary;  Laterality: Bilateral;   GASTRIC BYPASS  2005   HERNIA REPAIR     IR BONE MARROW BIOPSY & ASPIRATION  02/05/2023   NASAL SINUS SURGERY     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   TOTAL THYROIDECTOMY     TUBAL LIGATION      There were no vitals filed for this visit.   Subjective Assessment - 03/19/23 1704     Subjective  I am living with my brother at the moment since getting out of the hospital - while doing radiation - while getting little stronger - on 2 L oxygen- goal is to get of the oxygen    Currently in Pain? No/denies              Pulmonary note 03/18/23: Chief Complaint  Patient presents with   Follow-up      Shortness of breath on exertion. No cough or wheezing.       HPI   Post hospital follow up, had pleural effusion, drained with chest tube, MRSA on culture. On [place] today, right side and left side with trace effusions, will continue to monitor. Radiatio and chemotherapy started.       OT SCREEN 03/19/23: Patient was hospitalized recently.  See above note from pulmonary consult yesterday. 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

## 2023-03-20 ENCOUNTER — Other Ambulatory Visit: Payer: Self-pay

## 2023-03-20 ENCOUNTER — Ambulatory Visit
Admission: RE | Admit: 2023-03-20 | Discharge: 2023-03-20 | Disposition: A | Discharge status: Home / Self Care | Payer: Managed Care, Other (non HMO) | Source: Ambulatory Visit | Attending: Radiation Oncology | Admitting: Radiation Oncology

## 2023-03-20 ENCOUNTER — Ambulatory Visit: Payer: Managed Care, Other (non HMO)

## 2023-03-20 DIAGNOSIS — C9000 Multiple myeloma not having achieved remission: Secondary | ICD-10-CM

## 2023-03-20 DIAGNOSIS — Z5112 Encounter for antineoplastic immunotherapy: Secondary | ICD-10-CM | POA: Diagnosis not present

## 2023-03-20 LAB — RAD ONC ARIA SESSION SUMMARY
Course Elapsed Days: 3
Plan Fractions Treated to Date: 4
Plan Prescribed Dose Per Fraction: 3 Gy
Plan Total Fractions Prescribed: 10
Plan Total Prescribed Dose: 30 Gy
Reference Point Dosage Given to Date: 12 Gy
Reference Point Session Dosage Given: 3 Gy
Session Number: 4

## 2023-03-20 LAB — FUNGITELL BETA-D-GLUCAN
Fungitell Value:: 82.32 pg/mL
Result Name:: POSITIVE — AB

## 2023-03-21 ENCOUNTER — Inpatient Hospital Stay (HOSPITAL_BASED_OUTPATIENT_CLINIC_OR_DEPARTMENT_OTHER): Payer: Managed Care, Other (non HMO) | Admitting: Oncology

## 2023-03-21 ENCOUNTER — Other Ambulatory Visit (HOSPITAL_COMMUNITY): Payer: Self-pay

## 2023-03-21 ENCOUNTER — Ambulatory Visit: Payer: Managed Care, Other (non HMO)

## 2023-03-21 ENCOUNTER — Encounter: Payer: Self-pay | Admitting: Oncology

## 2023-03-21 ENCOUNTER — Ambulatory Visit
Admission: RE | Admit: 2023-03-21 | Discharge: 2023-03-21 | Disposition: A | Discharge status: Home / Self Care | Payer: Managed Care, Other (non HMO) | Source: Ambulatory Visit | Attending: Radiation Oncology | Admitting: Radiation Oncology

## 2023-03-21 ENCOUNTER — Inpatient Hospital Stay: Payer: Managed Care, Other (non HMO) | Attending: Oncology

## 2023-03-21 ENCOUNTER — Other Ambulatory Visit: Payer: Self-pay | Admitting: Pharmacist

## 2023-03-21 ENCOUNTER — Other Ambulatory Visit: Payer: Self-pay

## 2023-03-21 VITALS — BP 156/76 | HR 74 | Temp 97.0°F | Resp 16 | Ht 62.0 in | Wt 166.7 lb

## 2023-03-21 DIAGNOSIS — D509 Iron deficiency anemia, unspecified: Secondary | ICD-10-CM

## 2023-03-21 DIAGNOSIS — R0602 Shortness of breath: Secondary | ICD-10-CM

## 2023-03-21 DIAGNOSIS — Z7189 Other specified counseling: Secondary | ICD-10-CM

## 2023-03-21 DIAGNOSIS — C9000 Multiple myeloma not having achieved remission: Secondary | ICD-10-CM

## 2023-03-21 DIAGNOSIS — R7989 Other specified abnormal findings of blood chemistry: Secondary | ICD-10-CM

## 2023-03-21 DIAGNOSIS — Z5112 Encounter for antineoplastic immunotherapy: Secondary | ICD-10-CM | POA: Diagnosis not present

## 2023-03-21 LAB — CBC WITH DIFFERENTIAL/PLATELET
Abs Immature Granulocytes: 0.03 10*3/uL (ref 0.00–0.07)
Basophils Absolute: 0 10*3/uL (ref 0.0–0.1)
Basophils Relative: 0 %
Eosinophils Absolute: 0 10*3/uL (ref 0.0–0.5)
Eosinophils Relative: 0 %
HCT: 23.3 % — ABNORMAL LOW (ref 36.0–46.0)
Hemoglobin: 7.6 g/dL — ABNORMAL LOW (ref 12.0–15.0)
Immature Granulocytes: 1 %
Lymphocytes Relative: 9 %
Lymphs Abs: 0.6 10*3/uL — ABNORMAL LOW (ref 0.7–4.0)
MCH: 29.6 pg (ref 26.0–34.0)
MCHC: 32.6 g/dL (ref 30.0–36.0)
MCV: 90.7 fL (ref 80.0–100.0)
Monocytes Absolute: 0.1 10*3/uL (ref 0.1–1.0)
Monocytes Relative: 2 %
Neutro Abs: 5.3 10*3/uL (ref 1.7–7.7)
Neutrophils Relative %: 88 %
Platelets: 250 10*3/uL (ref 150–400)
RBC: 2.57 MIL/uL — ABNORMAL LOW (ref 3.87–5.11)
RDW: 14.6 % (ref 11.5–15.5)
WBC: 6 10*3/uL (ref 4.0–10.5)
nRBC: 0 % (ref 0.0–0.2)

## 2023-03-21 LAB — SAMPLE TO BLOOD BANK

## 2023-03-21 LAB — RAD ONC ARIA SESSION SUMMARY
Course Elapsed Days: 4
Plan Fractions Treated to Date: 5
Plan Prescribed Dose Per Fraction: 3 Gy
Plan Total Fractions Prescribed: 10
Plan Total Prescribed Dose: 30 Gy
Reference Point Dosage Given to Date: 15 Gy
Reference Point Session Dosage Given: 3 Gy
Session Number: 5

## 2023-03-21 LAB — CMP (CANCER CENTER ONLY)
ALT: 9 U/L (ref 0–44)
AST: 10 U/L — ABNORMAL LOW (ref 15–41)
Albumin: 2.6 g/dL — ABNORMAL LOW (ref 3.5–5.0)
Alkaline Phosphatase: 68 U/L (ref 38–126)
Anion gap: 9 (ref 5–15)
BUN: 19 mg/dL (ref 6–20)
CO2: 23 mmol/L (ref 22–32)
Calcium: 8.8 mg/dL — ABNORMAL LOW (ref 8.9–10.3)
Chloride: 103 mmol/L (ref 98–111)
Creatinine: 1.6 mg/dL — ABNORMAL HIGH (ref 0.44–1.00)
GFR, Estimated: 37 mL/min — ABNORMAL LOW (ref >60)
Glucose, Bld: 140 mg/dL — ABNORMAL HIGH (ref 70–99)
Potassium: 3.6 mmol/L (ref 3.5–5.1)
Sodium: 135 mmol/L (ref 135–145)
Total Bilirubin: 0.4 mg/dL (ref 0.3–1.2)
Total Protein: 8.3 g/dL — ABNORMAL HIGH (ref 6.5–8.1)

## 2023-03-21 LAB — BRAIN NATRIURETIC PEPTIDE: B Natriuretic Peptide: 59.6 pg/mL (ref 0.0–100.0)

## 2023-03-21 MED ORDER — LENALIDOMIDE 10 MG PO CAPS
10.0000 mg | ORAL_CAPSULE | Freq: Every day | ORAL | 0 refills | Status: DC
Start: 2023-03-21 — End: 2023-03-24

## 2023-03-23 ENCOUNTER — Encounter: Payer: Self-pay | Admitting: Oncology

## 2023-03-23 ENCOUNTER — Other Ambulatory Visit: Payer: Self-pay | Admitting: Hospice and Palliative Medicine

## 2023-03-23 DIAGNOSIS — Z515 Encounter for palliative care: Secondary | ICD-10-CM

## 2023-03-23 MED ORDER — LENALIDOMIDE 10 MG PO CAPS
10.0000 mg | ORAL_CAPSULE | Freq: Every day | ORAL | Status: DC
Start: 2023-03-23 — End: 2023-03-24

## 2023-03-23 MED ORDER — DEXAMETHASONE 4 MG PO TABS: ORAL_TABLET | ORAL | 5 refills | Status: DC

## 2023-03-23 MED ORDER — ASPIRIN 81 MG PO TBEC: 81.0000 mg | DELAYED_RELEASE_TABLET | Freq: Every day | ORAL | 3 refills | Status: DC

## 2023-03-23 MED ORDER — PROCHLORPERAZINE MALEATE 10 MG PO TABS: 10.0000 mg | ORAL_TABLET | Freq: Four times a day (QID) | ORAL | 1 refills | Status: DC | PRN

## 2023-03-23 MED ORDER — LIDOCAINE-PRILOCAINE 2.5-2.5 % EX CREA: TOPICAL_CREAM | CUTANEOUS | 3 refills | Status: DC

## 2023-03-23 MED ORDER — ONDANSETRON HCL 8 MG PO TABS: 8.0000 mg | ORAL_TABLET | Freq: Three times a day (TID) | ORAL | 1 refills | Status: DC | PRN

## 2023-03-23 MED ORDER — ACYCLOVIR 400 MG PO TABS: 400.0000 mg | ORAL_TABLET | Freq: Two times a day (BID) | ORAL | 5 refills | Status: DC

## 2023-03-23 NOTE — Progress Notes (Signed)
associated atelectasis. Status post surgical posterior fusion of thoracic spine. IMPRESSION: Stable position of left-sided pleural drainage catheter. Stable small bilateral pleural effusions with associated atelectasis. Electronically Signed   By: Lupita Raider M.D.   On: 03/10/2023 13:59   DG Chest Port 1 View  Result Date: 03/09/2023 CLINICAL DATA:  Chest tube placement EXAM: PORTABLE CHEST 1 VIEW COMPARISON:  8 a.m. FINDINGS: Left basilar pigtail chest tube has been placed in the interval with partial evacuation of left-sided pleural effusion. Small residual pleural fluid is suspected, possibly loculated. There is residual retrocardiac consolidation in keeping with atelectasis, infiltrate, or loculated pleural fluid within this region. Moderate right pleural effusion is unchanged. Cardiomegaly is stable. Pulmonary vascularity is normal. No acute bone abnormality. Thoracic fusion hardware again noted. IMPRESSION: 1. Interval placement of left basilar pigtail chest tube with partial evacuation of left-sided pleural effusion. Small residual pleural fluid is suspected, possibly loculated. 2. Residual retrocardiac consolidation in keeping with atelectasis,  infiltrate, or loculated pleural fluid within this region. 3. Stable moderate right pleural effusion. Electronically Signed   By: Helyn Numbers M.D.   On: 03/09/2023 21:18   DG Chest Port 1 View  Result Date: 03/09/2023 CLINICAL DATA:  10026 Shortness of breath 10026 EXAM: PORTABLE CHEST 1 VIEW COMPARISON:  March 07, 2023 FINDINGS: The cardiomediastinal silhouette is unchanged in contour. Bilateral pleural effusions, LEFT greater than RIGHT. No pneumothorax. Bibasilar homogeneous opacities within more focal bandlike opacity in LEFT mid lung. Status post posterior fixation of the thoracic spine. IMPRESSION: 1. Bilateral pleural effusions, LEFT greater than RIGHT. 2. Bibasilar opacities likely reflect atelectasis. Electronically Signed   By: Meda Klinefelter M.D.   On: 03/09/2023 10:15   ECHOCARDIOGRAM COMPLETE  Result Date: 03/08/2023    ECHOCARDIOGRAM REPORT   Patient Name:   Paula Garrett Date of Exam: 03/08/2023 Medical Rec #:  [id number]      Height:       62.0 in Accession #:    [id number]     Weight:       204.4 lb Date of Birth:  1965-09-02      BSA:          1.929 m Patient Age:    57 years       BP:           121/65 mmHg Patient Gender: F              HR:           63 bpm. Exam Location:  [facility] Procedure: 2D Echo Indications:     Pleural effusion  History:         Patient has prior history of Echocardiogram examinations, most                  recent 01/31/2023.  Sonographer:     Overton Mam RDCS, FASE Referring Phys:  [id number] Alphonsus Sias GRIFFITH Diagnosing Phys: Lennie Odor MD IMPRESSIONS  1. Left ventricular ejection fraction, by estimation, is 60 to 65%. The left ventricle has normal function. The left ventricle has no regional wall motion abnormalities. Left ventricular diastolic parameters were normal.  2. Right ventricular systolic function is normal. The right ventricular size is normal. There is normal pulmonary artery systolic pressure. The estimated right ventricular systolic  pressure is 31.8 mmHg.  3. The mitral valve is grossly normal. Trivial mitral valve regurgitation.  4. The aortic valve is tricuspid. Aortic valve regurgitation is not visualized. No aortic stenosis is present.  associated atelectasis. Status post surgical posterior fusion of thoracic spine. IMPRESSION: Stable position of left-sided pleural drainage catheter. Stable small bilateral pleural effusions with associated atelectasis. Electronically Signed   By: Lupita Raider M.D.   On: 03/10/2023 13:59   DG Chest Port 1 View  Result Date: 03/09/2023 CLINICAL DATA:  Chest tube placement EXAM: PORTABLE CHEST 1 VIEW COMPARISON:  8 a.m. FINDINGS: Left basilar pigtail chest tube has been placed in the interval with partial evacuation of left-sided pleural effusion. Small residual pleural fluid is suspected, possibly loculated. There is residual retrocardiac consolidation in keeping with atelectasis, infiltrate, or loculated pleural fluid within this region. Moderate right pleural effusion is unchanged. Cardiomegaly is stable. Pulmonary vascularity is normal. No acute bone abnormality. Thoracic fusion hardware again noted. IMPRESSION: 1. Interval placement of left basilar pigtail chest tube with partial evacuation of left-sided pleural effusion. Small residual pleural fluid is suspected, possibly loculated. 2. Residual retrocardiac consolidation in keeping with atelectasis,  infiltrate, or loculated pleural fluid within this region. 3. Stable moderate right pleural effusion. Electronically Signed   By: Helyn Numbers M.D.   On: 03/09/2023 21:18   DG Chest Port 1 View  Result Date: 03/09/2023 CLINICAL DATA:  10026 Shortness of breath 10026 EXAM: PORTABLE CHEST 1 VIEW COMPARISON:  March 07, 2023 FINDINGS: The cardiomediastinal silhouette is unchanged in contour. Bilateral pleural effusions, LEFT greater than RIGHT. No pneumothorax. Bibasilar homogeneous opacities within more focal bandlike opacity in LEFT mid lung. Status post posterior fixation of the thoracic spine. IMPRESSION: 1. Bilateral pleural effusions, LEFT greater than RIGHT. 2. Bibasilar opacities likely reflect atelectasis. Electronically Signed   By: Meda Klinefelter M.D.   On: 03/09/2023 10:15   ECHOCARDIOGRAM COMPLETE  Result Date: 03/08/2023    ECHOCARDIOGRAM REPORT   Patient Name:   Paula Garrett Date of Exam: 03/08/2023 Medical Rec #:  [id number]      Height:       62.0 in Accession #:    [id number]     Weight:       204.4 lb Date of Birth:  1965-09-02      BSA:          1.929 m Patient Age:    57 years       BP:           121/65 mmHg Patient Gender: F              HR:           63 bpm. Exam Location:  [facility] Procedure: 2D Echo Indications:     Pleural effusion  History:         Patient has prior history of Echocardiogram examinations, most                  recent 01/31/2023.  Sonographer:     Overton Mam RDCS, FASE Referring Phys:  [id number] Alphonsus Sias GRIFFITH Diagnosing Phys: Lennie Odor MD IMPRESSIONS  1. Left ventricular ejection fraction, by estimation, is 60 to 65%. The left ventricle has normal function. The left ventricle has no regional wall motion abnormalities. Left ventricular diastolic parameters were normal.  2. Right ventricular systolic function is normal. The right ventricular size is normal. There is normal pulmonary artery systolic pressure. The estimated right ventricular systolic  pressure is 31.8 mmHg.  3. The mitral valve is grossly normal. Trivial mitral valve regurgitation.  4. The aortic valve is tricuspid. Aortic valve regurgitation is not visualized. No aortic stenosis is present.  associated atelectasis. Status post surgical posterior fusion of thoracic spine. IMPRESSION: Stable position of left-sided pleural drainage catheter. Stable small bilateral pleural effusions with associated atelectasis. Electronically Signed   By: Lupita Raider M.D.   On: 03/10/2023 13:59   DG Chest Port 1 View  Result Date: 03/09/2023 CLINICAL DATA:  Chest tube placement EXAM: PORTABLE CHEST 1 VIEW COMPARISON:  8 a.m. FINDINGS: Left basilar pigtail chest tube has been placed in the interval with partial evacuation of left-sided pleural effusion. Small residual pleural fluid is suspected, possibly loculated. There is residual retrocardiac consolidation in keeping with atelectasis, infiltrate, or loculated pleural fluid within this region. Moderate right pleural effusion is unchanged. Cardiomegaly is stable. Pulmonary vascularity is normal. No acute bone abnormality. Thoracic fusion hardware again noted. IMPRESSION: 1. Interval placement of left basilar pigtail chest tube with partial evacuation of left-sided pleural effusion. Small residual pleural fluid is suspected, possibly loculated. 2. Residual retrocardiac consolidation in keeping with atelectasis,  infiltrate, or loculated pleural fluid within this region. 3. Stable moderate right pleural effusion. Electronically Signed   By: Helyn Numbers M.D.   On: 03/09/2023 21:18   DG Chest Port 1 View  Result Date: 03/09/2023 CLINICAL DATA:  10026 Shortness of breath 10026 EXAM: PORTABLE CHEST 1 VIEW COMPARISON:  March 07, 2023 FINDINGS: The cardiomediastinal silhouette is unchanged in contour. Bilateral pleural effusions, LEFT greater than RIGHT. No pneumothorax. Bibasilar homogeneous opacities within more focal bandlike opacity in LEFT mid lung. Status post posterior fixation of the thoracic spine. IMPRESSION: 1. Bilateral pleural effusions, LEFT greater than RIGHT. 2. Bibasilar opacities likely reflect atelectasis. Electronically Signed   By: Meda Klinefelter M.D.   On: 03/09/2023 10:15   ECHOCARDIOGRAM COMPLETE  Result Date: 03/08/2023    ECHOCARDIOGRAM REPORT   Patient Name:   Paula Garrett Date of Exam: 03/08/2023 Medical Rec #:  [id number]      Height:       62.0 in Accession #:    [id number]     Weight:       204.4 lb Date of Birth:  1965-09-02      BSA:          1.929 m Patient Age:    57 years       BP:           121/65 mmHg Patient Gender: F              HR:           63 bpm. Exam Location:  [facility] Procedure: 2D Echo Indications:     Pleural effusion  History:         Patient has prior history of Echocardiogram examinations, most                  recent 01/31/2023.  Sonographer:     Overton Mam RDCS, FASE Referring Phys:  [id number] Alphonsus Sias GRIFFITH Diagnosing Phys: Lennie Odor MD IMPRESSIONS  1. Left ventricular ejection fraction, by estimation, is 60 to 65%. The left ventricle has normal function. The left ventricle has no regional wall motion abnormalities. Left ventricular diastolic parameters were normal.  2. Right ventricular systolic function is normal. The right ventricular size is normal. There is normal pulmonary artery systolic pressure. The estimated right ventricular systolic  pressure is 31.8 mmHg.  3. The mitral valve is grossly normal. Trivial mitral valve regurgitation.  4. The aortic valve is tricuspid. Aortic valve regurgitation is not visualized. No aortic stenosis is present.  associated atelectasis. Status post surgical posterior fusion of thoracic spine. IMPRESSION: Stable position of left-sided pleural drainage catheter. Stable small bilateral pleural effusions with associated atelectasis. Electronically Signed   By: Lupita Raider M.D.   On: 03/10/2023 13:59   DG Chest Port 1 View  Result Date: 03/09/2023 CLINICAL DATA:  Chest tube placement EXAM: PORTABLE CHEST 1 VIEW COMPARISON:  8 a.m. FINDINGS: Left basilar pigtail chest tube has been placed in the interval with partial evacuation of left-sided pleural effusion. Small residual pleural fluid is suspected, possibly loculated. There is residual retrocardiac consolidation in keeping with atelectasis, infiltrate, or loculated pleural fluid within this region. Moderate right pleural effusion is unchanged. Cardiomegaly is stable. Pulmonary vascularity is normal. No acute bone abnormality. Thoracic fusion hardware again noted. IMPRESSION: 1. Interval placement of left basilar pigtail chest tube with partial evacuation of left-sided pleural effusion. Small residual pleural fluid is suspected, possibly loculated. 2. Residual retrocardiac consolidation in keeping with atelectasis,  infiltrate, or loculated pleural fluid within this region. 3. Stable moderate right pleural effusion. Electronically Signed   By: Helyn Numbers M.D.   On: 03/09/2023 21:18   DG Chest Port 1 View  Result Date: 03/09/2023 CLINICAL DATA:  10026 Shortness of breath 10026 EXAM: PORTABLE CHEST 1 VIEW COMPARISON:  March 07, 2023 FINDINGS: The cardiomediastinal silhouette is unchanged in contour. Bilateral pleural effusions, LEFT greater than RIGHT. No pneumothorax. Bibasilar homogeneous opacities within more focal bandlike opacity in LEFT mid lung. Status post posterior fixation of the thoracic spine. IMPRESSION: 1. Bilateral pleural effusions, LEFT greater than RIGHT. 2. Bibasilar opacities likely reflect atelectasis. Electronically Signed   By: Meda Klinefelter M.D.   On: 03/09/2023 10:15   ECHOCARDIOGRAM COMPLETE  Result Date: 03/08/2023    ECHOCARDIOGRAM REPORT   Patient Name:   Paula Garrett Date of Exam: 03/08/2023 Medical Rec #:  [id number]      Height:       62.0 in Accession #:    [id number]     Weight:       204.4 lb Date of Birth:  1965-09-02      BSA:          1.929 m Patient Age:    57 years       BP:           121/65 mmHg Patient Gender: F              HR:           63 bpm. Exam Location:  [facility] Procedure: 2D Echo Indications:     Pleural effusion  History:         Patient has prior history of Echocardiogram examinations, most                  recent 01/31/2023.  Sonographer:     Overton Mam RDCS, FASE Referring Phys:  [id number] Alphonsus Sias GRIFFITH Diagnosing Phys: Lennie Odor MD IMPRESSIONS  1. Left ventricular ejection fraction, by estimation, is 60 to 65%. The left ventricle has normal function. The left ventricle has no regional wall motion abnormalities. Left ventricular diastolic parameters were normal.  2. Right ventricular systolic function is normal. The right ventricular size is normal. There is normal pulmonary artery systolic pressure. The estimated right ventricular systolic  pressure is 31.8 mmHg.  3. The mitral valve is grossly normal. Trivial mitral valve regurgitation.  4. The aortic valve is tricuspid. Aortic valve regurgitation is not visualized. No aortic stenosis is present.  associated atelectasis. Status post surgical posterior fusion of thoracic spine. IMPRESSION: Stable position of left-sided pleural drainage catheter. Stable small bilateral pleural effusions with associated atelectasis. Electronically Signed   By: Lupita Raider M.D.   On: 03/10/2023 13:59   DG Chest Port 1 View  Result Date: 03/09/2023 CLINICAL DATA:  Chest tube placement EXAM: PORTABLE CHEST 1 VIEW COMPARISON:  8 a.m. FINDINGS: Left basilar pigtail chest tube has been placed in the interval with partial evacuation of left-sided pleural effusion. Small residual pleural fluid is suspected, possibly loculated. There is residual retrocardiac consolidation in keeping with atelectasis, infiltrate, or loculated pleural fluid within this region. Moderate right pleural effusion is unchanged. Cardiomegaly is stable. Pulmonary vascularity is normal. No acute bone abnormality. Thoracic fusion hardware again noted. IMPRESSION: 1. Interval placement of left basilar pigtail chest tube with partial evacuation of left-sided pleural effusion. Small residual pleural fluid is suspected, possibly loculated. 2. Residual retrocardiac consolidation in keeping with atelectasis,  infiltrate, or loculated pleural fluid within this region. 3. Stable moderate right pleural effusion. Electronically Signed   By: Helyn Numbers M.D.   On: 03/09/2023 21:18   DG Chest Port 1 View  Result Date: 03/09/2023 CLINICAL DATA:  10026 Shortness of breath 10026 EXAM: PORTABLE CHEST 1 VIEW COMPARISON:  March 07, 2023 FINDINGS: The cardiomediastinal silhouette is unchanged in contour. Bilateral pleural effusions, LEFT greater than RIGHT. No pneumothorax. Bibasilar homogeneous opacities within more focal bandlike opacity in LEFT mid lung. Status post posterior fixation of the thoracic spine. IMPRESSION: 1. Bilateral pleural effusions, LEFT greater than RIGHT. 2. Bibasilar opacities likely reflect atelectasis. Electronically Signed   By: Meda Klinefelter M.D.   On: 03/09/2023 10:15   ECHOCARDIOGRAM COMPLETE  Result Date: 03/08/2023    ECHOCARDIOGRAM REPORT   Patient Name:   Paula Garrett Date of Exam: 03/08/2023 Medical Rec #:  [id number]      Height:       62.0 in Accession #:    [id number]     Weight:       204.4 lb Date of Birth:  1965-09-02      BSA:          1.929 m Patient Age:    57 years       BP:           121/65 mmHg Patient Gender: F              HR:           63 bpm. Exam Location:  [facility] Procedure: 2D Echo Indications:     Pleural effusion  History:         Patient has prior history of Echocardiogram examinations, most                  recent 01/31/2023.  Sonographer:     Overton Mam RDCS, FASE Referring Phys:  [id number] Alphonsus Sias GRIFFITH Diagnosing Phys: Lennie Odor MD IMPRESSIONS  1. Left ventricular ejection fraction, by estimation, is 60 to 65%. The left ventricle has normal function. The left ventricle has no regional wall motion abnormalities. Left ventricular diastolic parameters were normal.  2. Right ventricular systolic function is normal. The right ventricular size is normal. There is normal pulmonary artery systolic pressure. The estimated right ventricular systolic  pressure is 31.8 mmHg.  3. The mitral valve is grossly normal. Trivial mitral valve regurgitation.  4. The aortic valve is tricuspid. Aortic valve regurgitation is not visualized. No aortic stenosis is present.  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  She was last seen by me in April 2024 and at that time she had complained of mild left chest wall pain radiating to her back.  She underwent cardiac workup and I had done a bone survey at that time which did not show any evidence of bone disease.  Her smoldering multiple myeloma at that time was stable.  Subsequently she has had chest x-ray in May 2024 as well as a rib x-ray in July 2024.  None of these showed any abnormalities.  Her pain has gradually worsened in the last 2 months but especially so in the last 1 week.  She presented to the ER with symptoms of pleuritic chest wall pain and shortness of breath and underwent a CT angio chest which showed a soft tissue mass with bone destruction involving the left seventh rib as well as T6 and T7 vertebral bodies and central canal.  Possibility of cord compression.  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   Results of myeloma work-up from 06/20/2020  showed an elevated IgG of 3668.  M protein was elevated at 3.1 and IFE showed IgG monoclonal lambda protein.  Beta-2 microglobulin and LDH were normal.  Serum free light chain ratio was elevated at 25 with a free light chain lambda of 303.  Bone marrow biopsy showed increased plasma cells 22% by manual count and 20% by IHC staining for CD138.  Cytogenetics for myeloma showed gain of 1 q.  PET CT scan was not approved by insurance.  MRI cervical and lumbar thoracic spine did not show any evidence of lytic lesions.  Bone survey was negative for bone lesions as well.  MRI pelvis with and without contrast showed diffuse patchy heterogeneous marrow concerning for myeloma    Patient had a repeat bone marrow biopsy inAugust 2024 which showed 20% overall plasma cells and hypercellular marrow consistent with plasma cell myeloma.  Cytogenetics normal and FISH studies did not show any abnormalities.  Patient had stabilization procedure/spinal fusion T4-T9 with T6-T7 laminotomies.  Spinal mass biopsy was consistent with plasma cell neoplasm. PET CT scan showed solitary hypermetabolic lytic lesion in the L4 vertebral body consistent with multiple myeloma.  Residual soft tissue density at the left paraspinal resection site T6-T7 with no significant metabolic activity.   Plan was to do outpatient daratumumab Revlimid Velcade dexamethasone regimen.  However patient was found to be in acute kidney failure with a creatinine of 5 in August 2024 and was sent to the ER.  She received cycle 1 of CyBorD in the hospital with improvement in her  associated atelectasis. Status post surgical posterior fusion of thoracic spine. IMPRESSION: Stable position of left-sided pleural drainage catheter. Stable small bilateral pleural effusions with associated atelectasis. Electronically Signed   By: Lupita Raider M.D.   On: 03/10/2023 13:59   DG Chest Port 1 View  Result Date: 03/09/2023 CLINICAL DATA:  Chest tube placement EXAM: PORTABLE CHEST 1 VIEW COMPARISON:  8 a.m. FINDINGS: Left basilar pigtail chest tube has been placed in the interval with partial evacuation of left-sided pleural effusion. Small residual pleural fluid is suspected, possibly loculated. There is residual retrocardiac consolidation in keeping with atelectasis, infiltrate, or loculated pleural fluid within this region. Moderate right pleural effusion is unchanged. Cardiomegaly is stable. Pulmonary vascularity is normal. No acute bone abnormality. Thoracic fusion hardware again noted. IMPRESSION: 1. Interval placement of left basilar pigtail chest tube with partial evacuation of left-sided pleural effusion. Small residual pleural fluid is suspected, possibly loculated. 2. Residual retrocardiac consolidation in keeping with atelectasis,  infiltrate, or loculated pleural fluid within this region. 3. Stable moderate right pleural effusion. Electronically Signed   By: Helyn Numbers M.D.   On: 03/09/2023 21:18   DG Chest Port 1 View  Result Date: 03/09/2023 CLINICAL DATA:  10026 Shortness of breath 10026 EXAM: PORTABLE CHEST 1 VIEW COMPARISON:  March 07, 2023 FINDINGS: The cardiomediastinal silhouette is unchanged in contour. Bilateral pleural effusions, LEFT greater than RIGHT. No pneumothorax. Bibasilar homogeneous opacities within more focal bandlike opacity in LEFT mid lung. Status post posterior fixation of the thoracic spine. IMPRESSION: 1. Bilateral pleural effusions, LEFT greater than RIGHT. 2. Bibasilar opacities likely reflect atelectasis. Electronically Signed   By: Meda Klinefelter M.D.   On: 03/09/2023 10:15   ECHOCARDIOGRAM COMPLETE  Result Date: 03/08/2023    ECHOCARDIOGRAM REPORT   Patient Name:   Paula Garrett Date of Exam: 03/08/2023 Medical Rec #:  [id number]      Height:       62.0 in Accession #:    [id number]     Weight:       204.4 lb Date of Birth:  1965-09-02      BSA:          1.929 m Patient Age:    57 years       BP:           121/65 mmHg Patient Gender: F              HR:           63 bpm. Exam Location:  [facility] Procedure: 2D Echo Indications:     Pleural effusion  History:         Patient has prior history of Echocardiogram examinations, most                  recent 01/31/2023.  Sonographer:     Overton Mam RDCS, FASE Referring Phys:  [id number] Alphonsus Sias GRIFFITH Diagnosing Phys: Lennie Odor MD IMPRESSIONS  1. Left ventricular ejection fraction, by estimation, is 60 to 65%. The left ventricle has normal function. The left ventricle has no regional wall motion abnormalities. Left ventricular diastolic parameters were normal.  2. Right ventricular systolic function is normal. The right ventricular size is normal. There is normal pulmonary artery systolic pressure. The estimated right ventricular systolic  pressure is 31.8 mmHg.  3. The mitral valve is grossly normal. Trivial mitral valve regurgitation.  4. The aortic valve is tricuspid. Aortic valve regurgitation is not visualized. No aortic stenosis is present.  associated atelectasis. Status post surgical posterior fusion of thoracic spine. IMPRESSION: Stable position of left-sided pleural drainage catheter. Stable small bilateral pleural effusions with associated atelectasis. Electronically Signed   By: Lupita Raider M.D.   On: 03/10/2023 13:59   DG Chest Port 1 View  Result Date: 03/09/2023 CLINICAL DATA:  Chest tube placement EXAM: PORTABLE CHEST 1 VIEW COMPARISON:  8 a.m. FINDINGS: Left basilar pigtail chest tube has been placed in the interval with partial evacuation of left-sided pleural effusion. Small residual pleural fluid is suspected, possibly loculated. There is residual retrocardiac consolidation in keeping with atelectasis, infiltrate, or loculated pleural fluid within this region. Moderate right pleural effusion is unchanged. Cardiomegaly is stable. Pulmonary vascularity is normal. No acute bone abnormality. Thoracic fusion hardware again noted. IMPRESSION: 1. Interval placement of left basilar pigtail chest tube with partial evacuation of left-sided pleural effusion. Small residual pleural fluid is suspected, possibly loculated. 2. Residual retrocardiac consolidation in keeping with atelectasis,  infiltrate, or loculated pleural fluid within this region. 3. Stable moderate right pleural effusion. Electronically Signed   By: Helyn Numbers M.D.   On: 03/09/2023 21:18   DG Chest Port 1 View  Result Date: 03/09/2023 CLINICAL DATA:  10026 Shortness of breath 10026 EXAM: PORTABLE CHEST 1 VIEW COMPARISON:  March 07, 2023 FINDINGS: The cardiomediastinal silhouette is unchanged in contour. Bilateral pleural effusions, LEFT greater than RIGHT. No pneumothorax. Bibasilar homogeneous opacities within more focal bandlike opacity in LEFT mid lung. Status post posterior fixation of the thoracic spine. IMPRESSION: 1. Bilateral pleural effusions, LEFT greater than RIGHT. 2. Bibasilar opacities likely reflect atelectasis. Electronically Signed   By: Meda Klinefelter M.D.   On: 03/09/2023 10:15   ECHOCARDIOGRAM COMPLETE  Result Date: 03/08/2023    ECHOCARDIOGRAM REPORT   Patient Name:   Paula Garrett Date of Exam: 03/08/2023 Medical Rec #:  [id number]      Height:       62.0 in Accession #:    [id number]     Weight:       204.4 lb Date of Birth:  1965-09-02      BSA:          1.929 m Patient Age:    57 years       BP:           121/65 mmHg Patient Gender: F              HR:           63 bpm. Exam Location:  [facility] Procedure: 2D Echo Indications:     Pleural effusion  History:         Patient has prior history of Echocardiogram examinations, most                  recent 01/31/2023.  Sonographer:     Overton Mam RDCS, FASE Referring Phys:  [id number] Alphonsus Sias GRIFFITH Diagnosing Phys: Lennie Odor MD IMPRESSIONS  1. Left ventricular ejection fraction, by estimation, is 60 to 65%. The left ventricle has normal function. The left ventricle has no regional wall motion abnormalities. Left ventricular diastolic parameters were normal.  2. Right ventricular systolic function is normal. The right ventricular size is normal. There is normal pulmonary artery systolic pressure. The estimated right ventricular systolic  pressure is 31.8 mmHg.  3. The mitral valve is grossly normal. Trivial mitral valve regurgitation.  4. The aortic valve is tricuspid. Aortic valve regurgitation is not visualized. No aortic stenosis is present.  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  She was last seen by me in April 2024 and at that time she had complained of mild left chest wall pain radiating to her back.  She underwent cardiac workup and I had done a bone survey at that time which did not show any evidence of bone disease.  Her smoldering multiple myeloma at that time was stable.  Subsequently she has had chest x-ray in May 2024 as well as a rib x-ray in July 2024.  None of these showed any abnormalities.  Her pain has gradually worsened in the last 2 months but especially so in the last 1 week.  She presented to the ER with symptoms of pleuritic chest wall pain and shortness of breath and underwent a CT angio chest which showed a soft tissue mass with bone destruction involving the left seventh rib as well as T6 and T7 vertebral bodies and central canal.  Possibility of cord compression.  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   Results of myeloma work-up from 06/20/2020  showed an elevated IgG of 3668.  M protein was elevated at 3.1 and IFE showed IgG monoclonal lambda protein.  Beta-2 microglobulin and LDH were normal.  Serum free light chain ratio was elevated at 25 with a free light chain lambda of 303.  Bone marrow biopsy showed increased plasma cells 22% by manual count and 20% by IHC staining for CD138.  Cytogenetics for myeloma showed gain of 1 q.  PET CT scan was not approved by insurance.  MRI cervical and lumbar thoracic spine did not show any evidence of lytic lesions.  Bone survey was negative for bone lesions as well.  MRI pelvis with and without contrast showed diffuse patchy heterogeneous marrow concerning for myeloma    Patient had a repeat bone marrow biopsy inAugust 2024 which showed 20% overall plasma cells and hypercellular marrow consistent with plasma cell myeloma.  Cytogenetics normal and FISH studies did not show any abnormalities.  Patient had stabilization procedure/spinal fusion T4-T9 with T6-T7 laminotomies.  Spinal mass biopsy was consistent with plasma cell neoplasm. PET CT scan showed solitary hypermetabolic lytic lesion in the L4 vertebral body consistent with multiple myeloma.  Residual soft tissue density at the left paraspinal resection site T6-T7 with no significant metabolic activity.   Plan was to do outpatient daratumumab Revlimid Velcade dexamethasone regimen.  However patient was found to be in acute kidney failure with a creatinine of 5 in August 2024 and was sent to the ER.  She received cycle 1 of CyBorD in the hospital with improvement in her  associated atelectasis. Status post surgical posterior fusion of thoracic spine. IMPRESSION: Stable position of left-sided pleural drainage catheter. Stable small bilateral pleural effusions with associated atelectasis. Electronically Signed   By: Lupita Raider M.D.   On: 03/10/2023 13:59   DG Chest Port 1 View  Result Date: 03/09/2023 CLINICAL DATA:  Chest tube placement EXAM: PORTABLE CHEST 1 VIEW COMPARISON:  8 a.m. FINDINGS: Left basilar pigtail chest tube has been placed in the interval with partial evacuation of left-sided pleural effusion. Small residual pleural fluid is suspected, possibly loculated. There is residual retrocardiac consolidation in keeping with atelectasis, infiltrate, or loculated pleural fluid within this region. Moderate right pleural effusion is unchanged. Cardiomegaly is stable. Pulmonary vascularity is normal. No acute bone abnormality. Thoracic fusion hardware again noted. IMPRESSION: 1. Interval placement of left basilar pigtail chest tube with partial evacuation of left-sided pleural effusion. Small residual pleural fluid is suspected, possibly loculated. 2. Residual retrocardiac consolidation in keeping with atelectasis,  infiltrate, or loculated pleural fluid within this region. 3. Stable moderate right pleural effusion. Electronically Signed   By: Helyn Numbers M.D.   On: 03/09/2023 21:18   DG Chest Port 1 View  Result Date: 03/09/2023 CLINICAL DATA:  10026 Shortness of breath 10026 EXAM: PORTABLE CHEST 1 VIEW COMPARISON:  March 07, 2023 FINDINGS: The cardiomediastinal silhouette is unchanged in contour. Bilateral pleural effusions, LEFT greater than RIGHT. No pneumothorax. Bibasilar homogeneous opacities within more focal bandlike opacity in LEFT mid lung. Status post posterior fixation of the thoracic spine. IMPRESSION: 1. Bilateral pleural effusions, LEFT greater than RIGHT. 2. Bibasilar opacities likely reflect atelectasis. Electronically Signed   By: Meda Klinefelter M.D.   On: 03/09/2023 10:15   ECHOCARDIOGRAM COMPLETE  Result Date: 03/08/2023    ECHOCARDIOGRAM REPORT   Patient Name:   Paula Garrett Date of Exam: 03/08/2023 Medical Rec #:  [id number]      Height:       62.0 in Accession #:    [id number]     Weight:       204.4 lb Date of Birth:  1965-09-02      BSA:          1.929 m Patient Age:    57 years       BP:           121/65 mmHg Patient Gender: F              HR:           63 bpm. Exam Location:  [facility] Procedure: 2D Echo Indications:     Pleural effusion  History:         Patient has prior history of Echocardiogram examinations, most                  recent 01/31/2023.  Sonographer:     Overton Mam RDCS, FASE Referring Phys:  [id number] Alphonsus Sias GRIFFITH Diagnosing Phys: Lennie Odor MD IMPRESSIONS  1. Left ventricular ejection fraction, by estimation, is 60 to 65%. The left ventricle has normal function. The left ventricle has no regional wall motion abnormalities. Left ventricular diastolic parameters were normal.  2. Right ventricular systolic function is normal. The right ventricular size is normal. There is normal pulmonary artery systolic pressure. The estimated right ventricular systolic  pressure is 31.8 mmHg.  3. The mitral valve is grossly normal. Trivial mitral valve regurgitation.  4. The aortic valve is tricuspid. Aortic valve regurgitation is not visualized. No aortic stenosis is present.  associated atelectasis. Status post surgical posterior fusion of thoracic spine. IMPRESSION: Stable position of left-sided pleural drainage catheter. Stable small bilateral pleural effusions with associated atelectasis. Electronically Signed   By: Lupita Raider M.D.   On: 03/10/2023 13:59   DG Chest Port 1 View  Result Date: 03/09/2023 CLINICAL DATA:  Chest tube placement EXAM: PORTABLE CHEST 1 VIEW COMPARISON:  8 a.m. FINDINGS: Left basilar pigtail chest tube has been placed in the interval with partial evacuation of left-sided pleural effusion. Small residual pleural fluid is suspected, possibly loculated. There is residual retrocardiac consolidation in keeping with atelectasis, infiltrate, or loculated pleural fluid within this region. Moderate right pleural effusion is unchanged. Cardiomegaly is stable. Pulmonary vascularity is normal. No acute bone abnormality. Thoracic fusion hardware again noted. IMPRESSION: 1. Interval placement of left basilar pigtail chest tube with partial evacuation of left-sided pleural effusion. Small residual pleural fluid is suspected, possibly loculated. 2. Residual retrocardiac consolidation in keeping with atelectasis,  infiltrate, or loculated pleural fluid within this region. 3. Stable moderate right pleural effusion. Electronically Signed   By: Helyn Numbers M.D.   On: 03/09/2023 21:18   DG Chest Port 1 View  Result Date: 03/09/2023 CLINICAL DATA:  10026 Shortness of breath 10026 EXAM: PORTABLE CHEST 1 VIEW COMPARISON:  March 07, 2023 FINDINGS: The cardiomediastinal silhouette is unchanged in contour. Bilateral pleural effusions, LEFT greater than RIGHT. No pneumothorax. Bibasilar homogeneous opacities within more focal bandlike opacity in LEFT mid lung. Status post posterior fixation of the thoracic spine. IMPRESSION: 1. Bilateral pleural effusions, LEFT greater than RIGHT. 2. Bibasilar opacities likely reflect atelectasis. Electronically Signed   By: Meda Klinefelter M.D.   On: 03/09/2023 10:15   ECHOCARDIOGRAM COMPLETE  Result Date: 03/08/2023    ECHOCARDIOGRAM REPORT   Patient Name:   Paula Garrett Date of Exam: 03/08/2023 Medical Rec #:  [id number]      Height:       62.0 in Accession #:    [id number]     Weight:       204.4 lb Date of Birth:  1965-09-02      BSA:          1.929 m Patient Age:    57 years       BP:           121/65 mmHg Patient Gender: F              HR:           63 bpm. Exam Location:  [facility] Procedure: 2D Echo Indications:     Pleural effusion  History:         Patient has prior history of Echocardiogram examinations, most                  recent 01/31/2023.  Sonographer:     Overton Mam RDCS, FASE Referring Phys:  [id number] Alphonsus Sias GRIFFITH Diagnosing Phys: Lennie Odor MD IMPRESSIONS  1. Left ventricular ejection fraction, by estimation, is 60 to 65%. The left ventricle has normal function. The left ventricle has no regional wall motion abnormalities. Left ventricular diastolic parameters were normal.  2. Right ventricular systolic function is normal. The right ventricular size is normal. There is normal pulmonary artery systolic pressure. The estimated right ventricular systolic  pressure is 31.8 mmHg.  3. The mitral valve is grossly normal. Trivial mitral valve regurgitation.  4. The aortic valve is tricuspid. Aortic valve regurgitation is not visualized. No aortic stenosis is present.  associated atelectasis. Status post surgical posterior fusion of thoracic spine. IMPRESSION: Stable position of left-sided pleural drainage catheter. Stable small bilateral pleural effusions with associated atelectasis. Electronically Signed   By: Lupita Raider M.D.   On: 03/10/2023 13:59   DG Chest Port 1 View  Result Date: 03/09/2023 CLINICAL DATA:  Chest tube placement EXAM: PORTABLE CHEST 1 VIEW COMPARISON:  8 a.m. FINDINGS: Left basilar pigtail chest tube has been placed in the interval with partial evacuation of left-sided pleural effusion. Small residual pleural fluid is suspected, possibly loculated. There is residual retrocardiac consolidation in keeping with atelectasis, infiltrate, or loculated pleural fluid within this region. Moderate right pleural effusion is unchanged. Cardiomegaly is stable. Pulmonary vascularity is normal. No acute bone abnormality. Thoracic fusion hardware again noted. IMPRESSION: 1. Interval placement of left basilar pigtail chest tube with partial evacuation of left-sided pleural effusion. Small residual pleural fluid is suspected, possibly loculated. 2. Residual retrocardiac consolidation in keeping with atelectasis,  infiltrate, or loculated pleural fluid within this region. 3. Stable moderate right pleural effusion. Electronically Signed   By: Helyn Numbers M.D.   On: 03/09/2023 21:18   DG Chest Port 1 View  Result Date: 03/09/2023 CLINICAL DATA:  10026 Shortness of breath 10026 EXAM: PORTABLE CHEST 1 VIEW COMPARISON:  March 07, 2023 FINDINGS: The cardiomediastinal silhouette is unchanged in contour. Bilateral pleural effusions, LEFT greater than RIGHT. No pneumothorax. Bibasilar homogeneous opacities within more focal bandlike opacity in LEFT mid lung. Status post posterior fixation of the thoracic spine. IMPRESSION: 1. Bilateral pleural effusions, LEFT greater than RIGHT. 2. Bibasilar opacities likely reflect atelectasis. Electronically Signed   By: Meda Klinefelter M.D.   On: 03/09/2023 10:15   ECHOCARDIOGRAM COMPLETE  Result Date: 03/08/2023    ECHOCARDIOGRAM REPORT   Patient Name:   Paula Garrett Date of Exam: 03/08/2023 Medical Rec #:  [id number]      Height:       62.0 in Accession #:    [id number]     Weight:       204.4 lb Date of Birth:  1965-09-02      BSA:          1.929 m Patient Age:    57 years       BP:           121/65 mmHg Patient Gender: F              HR:           63 bpm. Exam Location:  [facility] Procedure: 2D Echo Indications:     Pleural effusion  History:         Patient has prior history of Echocardiogram examinations, most                  recent 01/31/2023.  Sonographer:     Overton Mam RDCS, FASE Referring Phys:  [id number] Alphonsus Sias GRIFFITH Diagnosing Phys: Lennie Odor MD IMPRESSIONS  1. Left ventricular ejection fraction, by estimation, is 60 to 65%. The left ventricle has normal function. The left ventricle has no regional wall motion abnormalities. Left ventricular diastolic parameters were normal.  2. Right ventricular systolic function is normal. The right ventricular size is normal. There is normal pulmonary artery systolic pressure. The estimated right ventricular systolic  pressure is 31.8 mmHg.  3. The mitral valve is grossly normal. Trivial mitral valve regurgitation.  4. The aortic valve is tricuspid. Aortic valve regurgitation is not visualized. No aortic stenosis is present.

## 2023-03-23 NOTE — Progress Notes (Signed)
DISCONTINUE ON PATHWAY REGIMEN - Multiple Myeloma and Other Plasma Cell Dyscrasias     A cycle is every 21 days:     Dexamethasone      Bortezomib      Cyclophosphamide   **Always confirm dose/schedule in your pharmacy ordering system**  REASON: Other Reason PRIOR TREATMENT: [id number]: CyBord (Cyclophosphamide 500 mg/m2 IV D1, 8 + Bortezomib 1.3 mg/m2 SUBQ D1, 4, 8, 11 + Dexamethasone 40 mg PO D1, 8, 15) q21 Days x 4 Cycles Concurrent with Referral to Transplant Service TREATMENT RESPONSE: Unable to Evaluate  START ON PATHWAY REGIMEN - Multiple Myeloma and Other Plasma Cell Dyscrasias   DaraVRd (Daratumumab SUBQ + Bortezomib SUBQ D1,4,8,11 + Lenalidomide PO D1-14 + Dexamethasone 20 mg IV/PO D1,2,8,9,15,16) q21 Days (Induction Schema):   A cycle is every 21 days:     Lenalidomide      Dexamethasone      Bortezomib      Daratumumab and hyaluronidase-fihj    DaraVRd (Daratumumab SUBQ + Bortezomib SUBQ D1,4,8,11 + Lenalidomide PO D1-14 + Dexamethasone 20 mg IV/PO D1,2,8,9,15,16) q21 Days (Consolidation Schema):   A cycle is every 21 days:     Lenalidomide      Dexamethasone      Bortezomib      Daratumumab and hyaluronidase-fihj   **Always confirm dose/schedule in your pharmacy ordering system**  Patient Characteristics: Multiple Myeloma, Newly Diagnosed, Transplant Eligible, Standard Risk Disease Classification: Multiple Myeloma Therapeutic Status: Newly Diagnosed R2-ISS Staging: II Is Patient Eligible for Transplant<= Transplant Eligible Risk Status: Standard Risk Intent of Therapy: Non-Curative / Palliative Intent, Discussed with Patient

## 2023-03-24 ENCOUNTER — Ambulatory Visit: Payer: Managed Care, Other (non HMO)

## 2023-03-24 ENCOUNTER — Encounter: Payer: Self-pay | Admitting: Oncology

## 2023-03-24 ENCOUNTER — Other Ambulatory Visit: Payer: Self-pay | Admitting: Pharmacist

## 2023-03-24 ENCOUNTER — Other Ambulatory Visit (HOSPITAL_COMMUNITY): Payer: Self-pay

## 2023-03-24 ENCOUNTER — Other Ambulatory Visit: Payer: Self-pay

## 2023-03-24 ENCOUNTER — Ambulatory Visit
Admission: RE | Admit: 2023-03-24 | Discharge: 2023-03-24 | Disposition: A | Discharge status: Home / Self Care | Payer: Managed Care, Other (non HMO) | Source: Ambulatory Visit | Attending: Radiation Oncology | Admitting: Radiation Oncology

## 2023-03-24 ENCOUNTER — Inpatient Hospital Stay: Payer: Managed Care, Other (non HMO) | Admitting: Pharmacist

## 2023-03-24 DIAGNOSIS — Z5112 Encounter for antineoplastic immunotherapy: Secondary | ICD-10-CM | POA: Diagnosis not present

## 2023-03-24 DIAGNOSIS — C9000 Multiple myeloma not having achieved remission: Secondary | ICD-10-CM

## 2023-03-24 LAB — RAD ONC ARIA SESSION SUMMARY
Course Elapsed Days: 7
Plan Fractions Treated to Date: 6
Plan Prescribed Dose Per Fraction: 3 Gy
Plan Total Fractions Prescribed: 10
Plan Total Prescribed Dose: 30 Gy
Reference Point Dosage Given to Date: 18 Gy
Reference Point Session Dosage Given: 3 Gy
Session Number: 6

## 2023-03-24 MED ORDER — LENALIDOMIDE 10 MG PO CAPS
10.0000 mg | ORAL_CAPSULE | Freq: Every day | ORAL | 0 refills | Status: DC
Start: 2023-03-24 — End: 2023-03-25

## 2023-03-24 MED ORDER — HYDROMORPHONE HCL 2 MG PO TABS
2.0000 mg | ORAL_TABLET | Freq: Four times a day (QID) | ORAL | 0 refills | Status: DC | PRN
Start: 2023-03-24 — End: 2023-04-07

## 2023-03-24 NOTE — Addendum Note (Signed)
Addended by: Remi Haggard on: 03/24/2023 02:45 PM   Modules accepted: Orders

## 2023-03-24 NOTE — Progress Notes (Unsigned)
Clinical Pharmacist Practitioner [facility]  [phone] Fax:([phone]  Patient Care Team: Smitty Cords, DO as PCP - General (Family Medicine) Lemar Livings Merrily Pew, MD (General Surgery) Shelia Media, MD (Internal Medicine) Creig Hines, MD as Consulting Physician (Oncology)   Name of the patient: Paula Garrett  [id number]  07/04/1965   Date of visit: 03/24/23  HPI: Patient is a 57 y.o. female with IgG lambda multiple myeloma. Due to poor renal function, patient was originally treated with CyBorD, but on 03/31/23 she will be transitioned to treatment with DaraVRd.   Reason for Consult: Lenalidomide oral chemotherapy education.   PAST MEDICAL HISTORY: Past Medical History:  Diagnosis Date   Anxiety    Arthritis    Asthma    Barrett's esophagus 2015   COVID-19    03/10/20   Depression    GERD (gastroesophageal reflux disease)    Hypertension    Hypothyroidism    Pneumonia    Smoldering myeloma    Thyroid disease     HEMATOLOGY/ONCOLOGY HISTORY:  Oncology History  Multiple myeloma (HCC)  01/25/2023 Initial Diagnosis   Multiple myeloma (HCC)   02/11/2023 - 02/17/2023 Chemotherapy   Patient is on Treatment Plan : MYELOMA NEWLY DIAGNOSED TRANSPLANT CANDIDATE Cyclophosphamide IV + Bortezomib SQ + Dexamethasone (CyBorD) q21d x 4 cycles     03/05/2023 Cancer Staging   Staging form: Plasma Cell Myeloma and Plasma Cell Disorders, AJCC 8th Edition - Clinical stage from 03/05/2023: RISS Stage II (Beta-2-microglobulin (mg/L): 2.5, Albumin (g/dL): 2.1, ISS: Stage II, High-risk cytogenetics: Absent, LDH: Normal) - Signed by Creig Hines, MD on 03/23/2023 Stage prefix: Initial diagnosis Beta 2 microglobulin range (mg/L): Less than 3.5 Albumin range (g/dL): Less than 3.5 Cytogenetics: No abnormalities   03/31/2023 -  Chemotherapy   Patient is on Treatment Plan : MYELOMA NEWLY DIAGNOSED TRANSPLANT CANDIDATE DaraVRd (Daratumumab  SQ) q21d x 6 Cycles (Induction/Consolidation)       ALLERGIES:  is allergic to hctz [hydrochlorothiazide].  MEDICATIONS:  Current Outpatient Medications  Medication Sig Dispense Refill   acyclovir (ZOVIRAX) 400 MG tablet Take 1 tablet (400 mg total) by mouth 2 (two) times daily. 60 tablet 5   albuterol (VENTOLIN HFA) 108 (90 Base) MCG/ACT inhaler Inhale 2 puffs into the lungs every 6 (six) hours as needed for wheezing or shortness of breath. 18 g 11   allopurinol (ZYLOPRIM) 100 MG tablet Take 1 tablet (100 mg total) by mouth daily. 30 tablet 0   amLODipine (NORVASC) 10 MG tablet Take 1 tablet (10 mg total) by mouth daily. 90 tablet 3   aspirin EC 81 MG tablet Take 1 tablet (81 mg total) by mouth daily. Swallow whole. 30 tablet 2   aspirin EC 81 MG tablet Take 1 tablet (81 mg total) by mouth daily. 100 tablet 3   butalbital-acetaminophen-caffeine (FIORICET) 50-325-40 MG tablet Take 1 tablet by mouth every 4 (four) hours as needed for headache. 14 tablet 0   clonazePAM (KLONOPIN) 1 MG tablet TAKE 1 TABLET(1 MG) BY MOUTH TWICE DAILY 60 tablet 2   cyanocobalamin (VITAMIN B12) 1000 MCG/ML injection INJECT 1 ML IN THE MUSCLE EVERY 30 DAYS 4 mL 12   cyclobenzaprine (FLEXERIL) 10 MG tablet Take 0.5-1 tablets (5-10 mg total) by mouth 3 (three) times daily as needed for muscle spasms. 60 tablet 1   dexamethasone (DECADRON) 4 MG tablet Take 5 tabs (20 mg) weekly the day after daratumumab for 12 weeks. Take with breakfast. 20 tablet 5  docusate sodium (COLACE) 100 MG capsule Take 1 capsule (100 mg total) by mouth 2 (two) times daily. 10 capsule 0   escitalopram (LEXAPRO) 10 MG tablet Take 1 tablet (10 mg total) by mouth daily. 30 tablet 2   fentaNYL (DURAGESIC) 50 MCG/HR Place 1 patch onto the skin every 3 (three) days. 5 patch 0   gabapentin (NEURONTIN) 100 MG capsule Take 1 capsule (100 mg total) by mouth 3 (three) times daily. (Patient not taking: Reported on 03/21/2023) 30 capsule 0   HYDROmorphone  (DILAUDID) 2 MG tablet Take 1-2 tablets (2-4 mg total) by mouth every 6 (six) hours as needed for severe pain. 60 tablet 0   ipratropium-albuterol (DUONEB) 0.5-2.5 (3) MG/3ML SOLN Take 3 mLs by nebulization 4 (four) times daily. 360 mL 0   lenalidomide (REVLIMID) 10 MG capsule Take 1 capsule (10 mg total) by mouth daily. Take for 14 days, then hold for 7 days. Repeat every 21 days. 14 capsule 0   lenalidomide (REVLIMID) 10 MG capsule Take 1 capsule (10 mg total) by mouth daily.     levothyroxine (SYNTHROID) 125 MCG tablet Take 2 tablets (250 mcg total) by mouth daily before breakfast. 60 tablet 0   lidocaine-prilocaine (EMLA) cream Apply to affected area once 30 g 3   linezolid (ZYVOX) 600 MG tablet Take 1 tablet (600 mg total) by mouth every 12 (twelve) hours. 10 tablet 0   mometasone-formoterol (DULERA) 200-5 MCG/ACT AERO Inhale 2 puffs into the lungs 2 (two) times daily. 1 each 2   ondansetron (ZOFRAN) 8 MG tablet Take 1 tablet (8 mg total) by mouth every 8 (eight) hours as needed for nausea or vomiting. 30 tablet 1   pantoprazole (PROTONIX) 40 MG tablet 30 min before lunch or dinner 90 tablet 3   polyethylene glycol (MIRALAX / GLYCOLAX) 17 g packet Take 17 g by mouth 2 (two) times daily. 14 each 0   prochlorperazine (COMPAZINE) 10 MG tablet Take 1 tablet (10 mg total) by mouth every 6 (six) hours as needed for nausea or vomiting. 30 tablet 1   senna-docusate (SENOKOT-S) 8.6-50 MG tablet Take 1 tablet by mouth 2 (two) times daily. 30 tablet 0   Spacer/Aero-Holding Chambers (AEROCHAMBER MV) inhaler Use as instructed 1 each 0   SYRINGE-NEEDLE, DISP, 3 ML 25G X 1-1/2" 3 ML MISC 1 Device by Does not apply route every 30 (thirty) days. With B12 shot (Patient not taking: Reported on 03/21/2023) 12 each 1   thiamine (VITAMIN B-1) 100 MG tablet Take 1 tablet (100 mg total) by mouth daily. 30 tablet 0   umeclidinium bromide (INCRUSE ELLIPTA) 62.5 MCG/ACT AEPB Inhale 1 puff into the lungs daily. 30 each 0    Vitamin D, Ergocalciferol, (DRISDOL) 1.25 MG (50000 UNIT) CAPS capsule Take 50,000 Units by mouth every 7 (seven) days.     zolpidem (AMBIEN) 10 MG tablet TAKE 1/2 TO 1 TABLET(5 TO 10 MG) BY MOUTH AT BEDTIME AS NEEDED FOR SLEEP 30 tablet 2   No current facility-administered medications for this visit.    VITAL SIGNS: There were no vitals taken for this visit. There were no vitals filed for this visit.  Estimated body mass index is 30.49 kg/m as calculated from the following:   Height as of 03/21/23: 5\' 2"  (1.575 m).   Weight as of 03/21/23: 75.6 kg (166 lb 11.2 oz).  LABS: CBC:    Component Value Date/Time   WBC 6.0 03/21/2023 1302   HGB 7.6 (L) 03/21/2023 1302  HGB 7.4 (L) 02/10/2023 2009   HCT 23.3 (L) 03/21/2023 1302   HCT 38.5 07/02/2011 1529   PLT 250 03/21/2023 1302   PLT 311 07/02/2011 1529   MCV 90.7 03/21/2023 1302   MCV 90 07/02/2011 1529   NEUTROABS 5.3 03/21/2023 1302   LYMPHSABS 0.6 (L) 03/21/2023 1302   MONOABS 0.1 03/21/2023 1302   EOSABS 0.0 03/21/2023 1302   BASOSABS 0.0 03/21/2023 1302   Comprehensive Metabolic Panel:    Component Value Date/Time   NA 135 03/21/2023 1302   K 3.6 03/21/2023 1302   CL 103 03/21/2023 1302   CO2 23 03/21/2023 1302   BUN 19 03/21/2023 1302   CREATININE 1.60 (H) 03/21/2023 1302   GLUCOSE 140 (H) 03/21/2023 1302   CALCIUM 8.8 (L) 03/21/2023 1302   AST 10 (L) 03/21/2023 1302   ALT 9 03/21/2023 1302   ALKPHOS 68 03/21/2023 1302   BILITOT 0.4 03/21/2023 1302   PROT 8.3 (H) 03/21/2023 1302   ALBUMIN 2.6 (L) 03/21/2023 1302     Present during today's visit: patient and her friend  Start plan: Patient will start lenalidomide on 03/31/23 along with her in office treatment    Patient Education I spoke with patient for overview of new oral chemotherapy medication: lenalidomide    Administration: Counseled patient on administration, dosing, side effects, monitoring, drug-food interactions, safe handling, storage, and  disposal. Patient will take 1 capsule (10 mg total) by mouth daily. Take for 14 days, then hold for 7 days. Repeat every 21 days.  She reports already taking aspirin 81mg  daily.   Side Effects: Side effects include but not limited to: rash/itchy skin, N/V, fatigue, decreased wbc/hgb/plt, constipation or diarrhea.    Drug-drug Interactions (DDI): No current lenalidomide DDIs   Adherence: After discussion with patient no patient barriers to medication adherence identified.  Reviewed with patient importance of keeping a medication schedule and plan for any missed doses.  Ms. Derman voiced understanding and appreciation. All questions answered. Medication handout provided.  Provided patient with Oral Chemotherapy Navigation Clinic phone number. Patient knows to call the office with questions or concerns. Oral Chemotherapy Navigation Clinic will continue to follow.  Patient expressed understanding and was in agreement with this plan. She also understands that She can call clinic at any time with any questions, concerns, or complaints.   Medication Access Issues: RX sent to [facility] (per insurance requirement), instructed patient to call pharmacy on 03/26/23 if she has not heard from them by then  Follow-up plan: RTc as scheduled  Thank you for allowing me to participate in the care of this patient.   Time Total: 20 mins  Visit consisted of counseling and education on dealing with issues of symptom management in the setting of serious and potentially life-threatening illness.Greater than 50%  of this time was spent counseling and coordinating care related to the above assessment and plan.  Signed by: Remi Haggard, PharmD, BCPS, Nolon Bussing, CPP Hematology/Oncology Clinical Pharmacist Practitioner [place] [phone]  03/24/2023 10:50 AM

## 2023-03-25 ENCOUNTER — Other Ambulatory Visit: Payer: Self-pay | Admitting: Pharmacist

## 2023-03-25 ENCOUNTER — Other Ambulatory Visit: Payer: Self-pay

## 2023-03-25 ENCOUNTER — Ambulatory Visit: Payer: Managed Care, Other (non HMO)

## 2023-03-25 ENCOUNTER — Ambulatory Visit
Admission: RE | Admit: 2023-03-25 | Discharge: 2023-03-25 | Disposition: A | Discharge status: Home / Self Care | Payer: Managed Care, Other (non HMO) | Source: Ambulatory Visit | Attending: Radiation Oncology | Admitting: Radiation Oncology

## 2023-03-25 ENCOUNTER — Encounter: Payer: Self-pay | Admitting: Oncology

## 2023-03-25 DIAGNOSIS — Z5112 Encounter for antineoplastic immunotherapy: Secondary | ICD-10-CM | POA: Diagnosis not present

## 2023-03-25 DIAGNOSIS — C9000 Multiple myeloma not having achieved remission: Secondary | ICD-10-CM

## 2023-03-25 LAB — RAD ONC ARIA SESSION SUMMARY
Course Elapsed Days: 8
Plan Fractions Treated to Date: 7
Plan Prescribed Dose Per Fraction: 3 Gy
Plan Total Fractions Prescribed: 10
Plan Total Prescribed Dose: 30 Gy
Reference Point Dosage Given to Date: 21 Gy
Reference Point Session Dosage Given: 3 Gy
Session Number: 7

## 2023-03-25 MED ORDER — BUTALBITAL-APAP-CAFFEINE 50-325-40 MG PO TABS: 1.0000 | ORAL_TABLET | ORAL | 0 refills | Status: DC | PRN

## 2023-03-25 MED ORDER — LENALIDOMIDE 10 MG PO CAPS
10.0000 mg | ORAL_CAPSULE | Freq: Every day | ORAL | 0 refills | Status: DC
Start: 2023-03-25 — End: 2023-04-17

## 2023-03-25 NOTE — Progress Notes (Signed)
Pharmacist Chemotherapy Monitoring - Initial Assessment    Anticipated start date: 03/31/23   The following has been reviewed per standard work regarding the patient's treatment regimen: The patient's diagnosis, treatment plan and drug doses, and organ/hematologic function Lab orders and baseline tests specific to treatment regimen  The treatment plan start date, drug sequencing, and pre-medications Prior authorization status  Patient's documented medication list, including drug-drug interaction screen and prescriptions for anti-emetics and supportive care specific to the treatment regimen The drug concentrations, fluid compatibility, administration routes, and timing of the medications to be used The patient's access for treatment and lifetime cumulative dose history, if applicable  The patient's medication allergies and previous infusion related reactions, if applicable   Changes made to treatment plan:  T&S + Pre-tx phenotype  lab ordered to monitor   with Daratumumab  Follow up needed:  F/u pre-tx phenotype & T&S drawn prior to starting Daratumumab  Ebony Hail, Pharm.D., CPP 03/25/2023@10 :28 PM

## 2023-03-25 NOTE — Progress Notes (Signed)
Name: Paula Garrett DOB: 02-25-1966  Final Intake Summary:  Paula Garrett is a 57 y/o white female with stage II MM first dx'd in Aug 2024, referred by NP Laurette Schimke at [facility] for apparent depression. Paula Garrett has had been hospitalized 3 times since her dx, twice for acute respiratory failure. At time of CC referral, Paula Garrett has also been dx'd with moderate MDD and unspecified anxiety disorder. She expressed openness to psychiatric medication recommendations through CC, however she is presently established with an external psychiatrist.   Assessments:  Paula Garrett scored PHQ-9=21 (severe), reporting daily anhedonia, depression, trouble falling & staying asleep, poor appetite, feeling bad about herself, and trouble concentrating. She reported fatigue over 1/2 the days and psychomotor agitation several days. Paula Garrett denied SI.  Paula Garrett scored GAD-7=18 (severe), reporting fear, irritability, nervousness, trouble relaxing, restlessness, and worrying w/o ability to control. Her primary worries involve finances and social isolation.   Psych Tx/Hx: Paula Garrett reported current prescriptions for Klonopin 1mg , Lexapro 10mg , and Ambien 10mg . She was unable to verify prior psych meds today, however she recalls disliking Trazodone. Additional past meds per Epic include Alprazolam, Hydroxyzine, Cymbalta, and Abilify. Paula Garrett has engaged in virtual and in-person psychiatry but hasn't been able to schedule w/ her psychiatrist recently d/t being hospitalized.   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 Paula Garrett has 2 sons, ages 55 and 59. She grew up cheerleading.  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

## 2023-03-25 NOTE — Progress Notes (Signed)
Lenalidomide redirected to [facility]

## 2023-03-26 ENCOUNTER — Telehealth: Payer: Self-pay

## 2023-03-26 ENCOUNTER — Ambulatory Visit: Payer: Managed Care, Other (non HMO)

## 2023-03-26 ENCOUNTER — Ambulatory Visit
Admission: RE | Admit: 2023-03-26 | Discharge: 2023-03-26 | Disposition: A | Discharge status: Home / Self Care | Payer: Managed Care, Other (non HMO) | Source: Ambulatory Visit | Attending: Radiation Oncology | Admitting: Radiation Oncology

## 2023-03-26 ENCOUNTER — Other Ambulatory Visit: Payer: Self-pay

## 2023-03-26 DIAGNOSIS — Z5112 Encounter for antineoplastic immunotherapy: Secondary | ICD-10-CM | POA: Diagnosis not present

## 2023-03-26 LAB — RAD ONC ARIA SESSION SUMMARY
Course Elapsed Days: 9
Plan Fractions Treated to Date: 8
Plan Prescribed Dose Per Fraction: 3 Gy
Plan Total Fractions Prescribed: 10
Plan Total Prescribed Dose: 30 Gy
Reference Point Dosage Given to Date: 24 Gy
Reference Point Session Dosage Given: 3 Gy
Session Number: 8

## 2023-03-26 NOTE — Patient Outreach (Signed)
Care Management  Transitions of Care Program Transitions of Care Post-discharge week 2   03/26/2023 Name: Paula Garrett MRN: [id number] DOB: 1965-08-03  Subjective: Paula Garrett is a 57 y.o. year old female who is a primary care patient of Smitty Cords, DO. The Care Management team Engaged with patient Engaged with patient by telephone to assess and address transitions of care needs.   Consent to Services:  Patient was given information about care management services, agreed to services, and gave verbal consent to participate.   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 Declined additional Child psychotherapist services. Per patient she is feeling related to discharge diagnosis and her SOB is better. [facility] having [facility] Has multiple follow up appointment with oncology Acknowledged her PCP visit set for November. Has kept all appointments and is adhering to prescribed medication regime. Patient understands her medications, her oxygenation needs, her need to follow up. She had no further needs or questions for RNCM at this time.   SDOH Interventions    Flowsheet Row Telephone from 03/18/2023 in [place] [facility] Infusion from 03/04/2023 in [facility] Visit from 01/01/2023 in [facility] Office Visit from 11/20/2022 in [facility] Video Visit from 10/18/2020 in [facility]  SDOH Interventions       Food Insecurity Interventions Intervention Not Indicated  -- -- -- --  Housing Interventions Intervention Not Indicated -- -- -- --  Transportation Interventions Intervention Not Indicated, Patient Resources Dietitian) -- -- -- --  Utilities Interventions Intervention Not Indicated -- -- -- --  Alcohol Usage Interventions -- -- Alcohol Education/Brief Advice -- --  Depression Interventions/Treatment  -- Referral to Psychiatry Counseling, Currently on Treatment Currently on Treatment Medication  Stress Interventions -- Provide Counseling, Other (Comment)  Engineer, petroleum consult placed] -- -- --  Social Connections Interventions -- Other (Comment)  Engineer, petroleum consult placed] -- -- --  Health Literacy Interventions -- Intervention Not Indicated -- -- --        Plan:  Patient declined further outreach calls.  Gabriel Cirri RN MSN  [place]  [facility],  Email: [email or web address] Direct Dial: [phone]

## 2023-03-27 ENCOUNTER — Other Ambulatory Visit: Payer: Managed Care, Other (non HMO)

## 2023-03-27 ENCOUNTER — Encounter: Payer: Self-pay | Admitting: Oncology

## 2023-03-27 ENCOUNTER — Other Ambulatory Visit: Payer: Self-pay

## 2023-03-27 ENCOUNTER — Ambulatory Visit
Admission: RE | Admit: 2023-03-27 | Discharge: 2023-03-27 | Disposition: A | Discharge status: Home / Self Care | Payer: Managed Care, Other (non HMO) | Source: Ambulatory Visit | Attending: Radiation Oncology | Admitting: Radiation Oncology

## 2023-03-27 DIAGNOSIS — Z5112 Encounter for antineoplastic immunotherapy: Secondary | ICD-10-CM | POA: Diagnosis not present

## 2023-03-27 LAB — RAD ONC ARIA SESSION SUMMARY
Course Elapsed Days: 10
Plan Fractions Treated to Date: 9
Plan Prescribed Dose Per Fraction: 3 Gy
Plan Total Fractions Prescribed: 10
Plan Total Prescribed Dose: 30 Gy
Reference Point Dosage Given to Date: 27 Gy
Reference Point Session Dosage Given: 3 Gy
Session Number: 9

## 2023-03-28 ENCOUNTER — Other Ambulatory Visit (HOSPITAL_COMMUNITY): Payer: Self-pay

## 2023-03-28 ENCOUNTER — Ambulatory Visit
Admission: RE | Admit: 2023-03-28 | Discharge: 2023-03-28 | Disposition: A | Discharge status: Home / Self Care | Payer: Managed Care, Other (non HMO) | Source: Ambulatory Visit | Attending: Radiation Oncology | Admitting: Radiation Oncology

## 2023-03-28 ENCOUNTER — Other Ambulatory Visit: Payer: Self-pay

## 2023-03-28 ENCOUNTER — Telehealth: Payer: Self-pay

## 2023-03-28 ENCOUNTER — Ambulatory Visit: Payer: Managed Care, Other (non HMO)

## 2023-03-28 DIAGNOSIS — Z5112 Encounter for antineoplastic immunotherapy: Secondary | ICD-10-CM | POA: Diagnosis not present

## 2023-03-28 LAB — RAD ONC ARIA SESSION SUMMARY
Course Elapsed Days: 11
Plan Fractions Treated to Date: 10
Plan Prescribed Dose Per Fraction: 3 Gy
Plan Total Fractions Prescribed: 10
Plan Total Prescribed Dose: 30 Gy
Reference Point Dosage Given to Date: 30 Gy
Reference Point Session Dosage Given: 3 Gy
Session Number: 10

## 2023-03-28 NOTE — Telephone Encounter (Signed)
Oral Oncology Patient Advocate Encounter  Prior Authorization for Lenalidomide has been approved.    PA# [id number]  Effective dates: 03/28/23 through 03/27/24  Patients co-pay is $0.00.    Ardeen Fillers, CPhT Oncology Pharmacy Patient Advocate  [facility]  [phone] (phone) [phone] (fax) 03/28/2023 1:09 PM

## 2023-03-28 NOTE — Telephone Encounter (Signed)
Oral Oncology Patient Advocate Encounter  New authorization   Received notification that prior authorization for Lenalidomide 10 mg capsules is required.   PA submitted verbally to [employer] on 03/28/23  Case ID: [id number]  Status is pending     Ardeen Fillers, CPhT Oncology Pharmacy Patient Advocate  [facility]  [phone] (phone) [phone] (fax) 03/28/2023 1:07 PM

## 2023-03-28 NOTE — Telephone Encounter (Addendum)
Obtained Prior Auth approval for Lenalidomide 10mg  capsules and called [facility] to provide them with PA information. Informed by [facility] Representative that they would more than likely reach out to patient on Monday, 03/31/23 to schedule delivery. I will follow up with them until I have a confirmed delivery date for patient.  Called and spoke to patient to let her know what was going on. Patient knows to expect a call from [facility] on Monday and knows that I will be following up with them on Monday as well. Patient also knows to call me at [phone] with any questions or concerns regarding receiving medication from [facility] once we have initial delivery scheduled.     Ardeen Fillers, CPhT Oncology Pharmacy Patient Advocate  [facility]  [phone] (phone) [phone] (fax) 03/28/2023 1:35 PM

## 2023-03-28 NOTE — Telephone Encounter (Signed)
Called [facility] to check on status of Lenalidomide script. Informed by representative that a new Prior Authorization was needed since original PA was for 15mg  and script was sent in for 10mg .  See additional encounter.    Ardeen Fillers, CPhT Oncology Pharmacy Patient Advocate  [facility]  [phone] (phone) [phone] (fax) 03/28/2023 12:56 PM

## 2023-03-31 ENCOUNTER — Encounter: Payer: Self-pay | Admitting: Oncology

## 2023-03-31 ENCOUNTER — Inpatient Hospital Stay: Payer: Managed Care, Other (non HMO)

## 2023-03-31 ENCOUNTER — Other Ambulatory Visit (HOSPITAL_COMMUNITY): Payer: Self-pay

## 2023-03-31 ENCOUNTER — Ambulatory Visit: Payer: Managed Care, Other (non HMO)

## 2023-03-31 ENCOUNTER — Other Ambulatory Visit: Payer: Managed Care, Other (non HMO)

## 2023-03-31 ENCOUNTER — Inpatient Hospital Stay (HOSPITAL_BASED_OUTPATIENT_CLINIC_OR_DEPARTMENT_OTHER): Payer: Managed Care, Other (non HMO) | Admitting: Oncology

## 2023-03-31 ENCOUNTER — Other Ambulatory Visit: Payer: Self-pay

## 2023-03-31 VITALS — BP 142/92 | HR 97 | Temp 97.8°F | Resp 16

## 2023-03-31 VITALS — BP 147/80 | HR 91 | Temp 96.8°F | Resp 17 | Ht 62.0 in | Wt 164.4 lb

## 2023-03-31 DIAGNOSIS — C9000 Multiple myeloma not having achieved remission: Secondary | ICD-10-CM | POA: Diagnosis not present

## 2023-03-31 DIAGNOSIS — Z5112 Encounter for antineoplastic immunotherapy: Secondary | ICD-10-CM | POA: Diagnosis not present

## 2023-03-31 DIAGNOSIS — Z5111 Encounter for antineoplastic chemotherapy: Secondary | ICD-10-CM | POA: Diagnosis not present

## 2023-03-31 DIAGNOSIS — G893 Neoplasm related pain (acute) (chronic): Secondary | ICD-10-CM

## 2023-03-31 DIAGNOSIS — D472 Monoclonal gammopathy: Secondary | ICD-10-CM

## 2023-03-31 LAB — CBC WITH DIFFERENTIAL/PLATELET
Abs Immature Granulocytes: 0.03 10*3/uL (ref 0.00–0.07)
Basophils Absolute: 0.1 10*3/uL (ref 0.0–0.1)
Basophils Relative: 1 %
Eosinophils Absolute: 0.2 10*3/uL (ref 0.0–0.5)
Eosinophils Relative: 3 %
HCT: 22.3 % — ABNORMAL LOW (ref 36.0–46.0)
Hemoglobin: 7.2 g/dL — ABNORMAL LOW (ref 12.0–15.0)
Immature Granulocytes: 1 %
Lymphocytes Relative: 13 %
Lymphs Abs: 0.8 10*3/uL (ref 0.7–4.0)
MCH: 29.1 pg (ref 26.0–34.0)
MCHC: 32.3 g/dL (ref 30.0–36.0)
MCV: 90.3 fL (ref 80.0–100.0)
Monocytes Absolute: 0.9 10*3/uL (ref 0.1–1.0)
Monocytes Relative: 15 %
Neutro Abs: 4.1 10*3/uL (ref 1.7–7.7)
Neutrophils Relative %: 67 %
Platelets: 355 10*3/uL (ref 150–400)
RBC: 2.47 MIL/uL — ABNORMAL LOW (ref 3.87–5.11)
RDW: 16.3 % — ABNORMAL HIGH (ref 11.5–15.5)
WBC: 6 10*3/uL (ref 4.0–10.5)
nRBC: 0 % (ref 0.0–0.2)

## 2023-03-31 LAB — COMPREHENSIVE METABOLIC PANEL
ALT: 12 U/L (ref 0–44)
AST: 15 U/L (ref 15–41)
Albumin: 2.8 g/dL — ABNORMAL LOW (ref 3.5–5.0)
Alkaline Phosphatase: 69 U/L (ref 38–126)
Anion gap: 9 (ref 5–15)
BUN: 15 mg/dL (ref 6–20)
CO2: 25 mmol/L (ref 22–32)
Calcium: 9.4 mg/dL (ref 8.9–10.3)
Chloride: 103 mmol/L (ref 98–111)
Creatinine, Ser: 1.43 mg/dL — ABNORMAL HIGH (ref 0.44–1.00)
GFR, Estimated: 42 mL/min — ABNORMAL LOW (ref >60)
Glucose, Bld: 125 mg/dL — ABNORMAL HIGH (ref 70–99)
Potassium: 3.4 mmol/L — ABNORMAL LOW (ref 3.5–5.1)
Sodium: 137 mmol/L (ref 135–145)
Total Bilirubin: 0.2 mg/dL — ABNORMAL LOW (ref 0.3–1.2)
Total Protein: 8.1 g/dL (ref 6.5–8.1)

## 2023-03-31 LAB — TYPE AND SCREEN
ABO/RH(D): A POS
Antibody Screen: NEGATIVE

## 2023-03-31 MED ORDER — BORTEZOMIB CHEMO SQ INJECTION 3.5 MG (2.5MG/ML)
1.3000 mg/m2 | Freq: Once | INTRAMUSCULAR | Status: AC
  Administered 2023-03-31: 2.25 mg via SUBCUTANEOUS
  Filled 2023-03-31: qty 0.9

## 2023-03-31 MED ORDER — ACETAMINOPHEN 325 MG PO TABS
650.0000 mg | ORAL_TABLET | Freq: Once | ORAL | Status: AC
  Administered 2023-03-31: 650 mg via ORAL
  Filled 2023-03-31: qty 2

## 2023-03-31 MED ORDER — DIPHENHYDRAMINE HCL 25 MG PO CAPS
50.0000 mg | ORAL_CAPSULE | Freq: Once | ORAL | Status: AC
  Administered 2023-03-31: 50 mg via ORAL
  Filled 2023-03-31: qty 2

## 2023-03-31 MED ORDER — MONTELUKAST SODIUM 10 MG PO TABS
10.0000 mg | ORAL_TABLET | Freq: Once | ORAL | Status: AC
  Administered 2023-03-31: 10 mg via ORAL
  Filled 2023-03-31: qty 1

## 2023-03-31 MED ORDER — DARATUMUMAB-HYALURONIDASE-FIHJ 1800-30000 MG-UT/15ML ~~LOC~~ SOLN
1800.0000 mg | Freq: Once | SUBCUTANEOUS | Status: AC
  Administered 2023-03-31: 1800 mg via SUBCUTANEOUS
  Filled 2023-03-31: qty 15

## 2023-03-31 MED ORDER — DEXAMETHASONE 4 MG PO TABS
20.0000 mg | ORAL_TABLET | Freq: Once | ORAL | Status: AC
  Administered 2023-03-31: 20 mg via ORAL
  Filled 2023-03-31: qty 5

## 2023-03-31 NOTE — Patient Instructions (Signed)
[facility]  Discharge Instructions: Thank you for choosing [facility] to provide your oncology and hematology care.  If you have a lab appointment with the Cancer Center, please go directly to the Cancer Center and check in at the registration area.  Wear comfortable clothing and clothing appropriate for easy access to any Portacath or PICC line.   We strive to give you quality time with your provider. You may need to reschedule your appointment if you arrive late (15 or more minutes).  Arriving late affects you and other patients whose appointments are after yours.  Also, if you miss three or more appointments without notifying the office, you may be dismissed from the clinic at the provider's discretion.      For prescription refill requests, have your pharmacy contact our office and allow 72 hours for refills to be completed.    Today you received the following chemotherapy and/or immunotherapy agents Velcade, Darzalex      To help prevent nausea and vomiting after your treatment, we encourage you to take your nausea medication as directed.  BELOW ARE SYMPTOMS THAT SHOULD BE REPORTED IMMEDIATELY: *FEVER GREATER THAN 100.4 F (38 C) OR HIGHER *CHILLS OR SWEATING *NAUSEA AND VOMITING THAT IS NOT CONTROLLED WITH YOUR NAUSEA MEDICATION *UNUSUAL SHORTNESS OF BREATH *UNUSUAL BRUISING OR BLEEDING *URINARY PROBLEMS (pain or burning when urinating, or frequent urination) *BOWEL PROBLEMS (unusual diarrhea, constipation, pain near the anus) TENDERNESS IN MOUTH AND THROAT WITH OR WITHOUT PRESENCE OF ULCERS (sore throat, sores in mouth, or a toothache) UNUSUAL RASH, SWELLING OR PAIN  UNUSUAL VAGINAL DISCHARGE OR ITCHING   Items with * indicate a potential emergency and should be followed up as soon as possible or go to the Emergency Department if any problems should occur.  Please show the CHEMOTHERAPY ALERT CARD or IMMUNOTHERAPY ALERT CARD at  check-in to the Emergency Department and triage nurse.  Should you have questions after your visit or need to cancel or reschedule your appointment, please contact [facility]  [phone] and follow the prompts.  Office hours are 8:00 a.m. to 4:30 p.m. Monday - Friday. Please note that voicemails left after 4:00 p.m. may not be returned until the following business day.  We are closed weekends and major holidays. You have access to a nurse at all times for urgent questions. Please call the main number to the clinic [phone] and follow the prompts.  For any non-urgent questions, you may also contact your provider using MyChart. We now offer e-Visits for anyone 25 and older to request care online for non-urgent symptoms. For details visit [email or web address].   Also download the MyChart app! Go to the app store, search "MyChart", open the app, select , and log in with your MyChart username and password.  Daratumumab; Hyaluronidase Injection What is this medication? DARATUMUMAB; HYALURONIDASE (dar a toom ue mab; hye al ur ON i dase) treats multiple myeloma, a type of bone marrow cancer. Daratumumab works by blocking a protein that causes cancer cells to grow and multiply. This helps to slow or stop the spread of cancer cells. Hyaluronidase works by increasing the absorption of other medications in the body to help them work better. This medication may also be used treat amyloidosis, a condition that causes the buildup of a protein (amyloid) in your body. It works by reducing the buildup of this protein, which decreases symptoms. It is a combination medication that contains a monoclonal  antibody. This medicine may be used for other purposes; ask your health care provider or pharmacist if you have questions. COMMON BRAND NAME(S): [name] What should I tell my care team before I take this medication? They need to know if you have any of these  conditions: Heart disease Infection, such as chickenpox, cold sores, herpes, hepatitis B Lung or breathing disease An unusual or allergic reaction to daratumumab, hyaluronidase, other medications, foods, dyes, or preservatives Pregnant or trying to get pregnant Breast-feeding How should I use this medication? This medication is injected under the skin. It is given by your care team in a hospital or clinic setting. Talk to your care team about the use of this medication in children. Special care may be needed. Overdosage: If you think you have taken too much of this medicine contact a poison control center or emergency room at once. NOTE: This medicine is only for you. Do not share this medicine with others. What if I miss a dose? Keep appointments for follow-up doses. It is important not to miss your dose. Call your care team if you are unable to keep an appointment. What may interact with this medication? Interactions have not been studied. This list may not describe all possible interactions. Give your health care provider a list of all the medicines, herbs, non-prescription drugs, or dietary supplements you use. Also tell them if you smoke, drink alcohol, or use illegal drugs. Some items may interact with your medicine. What should I watch for while using this medication? Your condition will be monitored carefully while you are receiving this medication. This medication can cause serious allergic reactions. To reduce your risk, your care team may give you other medication to take before receiving this one. Be sure to follow the directions from your care team. This medication can affect the results of blood tests to match your blood type. These changes can last for up to 6 months after the final dose. Your care team will do blood tests to match your blood type before you start treatment. Tell all of your care team that you are being treated with this medication before receiving a blood  transfusion. This medication can affect the results of some tests used to determine treatment response; extra tests may be needed to evaluate response. Talk to your care team if you wish to become pregnant or think you are pregnant. This medication can cause serious birth defects if taken during pregnancy and for 3 months after the last dose. A reliable form of contraception is recommended while taking this medication and for 3 months after the last dose. Talk to your care team about effective forms of contraception. Do not breast-feed while taking this medication. What side effects may I notice from receiving this medication? Side effects that you should report to your care team as soon as possible: Allergic reactions--skin rash, itching, hives, swelling of the face, lips, tongue, or throat Heart rhythm changes--fast or irregular heartbeat, dizziness, feeling faint or lightheaded, chest pain, trouble breathing Infection--fever, chills, cough, sore throat, wounds that don't heal, pain or trouble when passing urine, general feeling of discomfort or being unwell Infusion reactions--chest pain, shortness of breath or trouble breathing, feeling faint or lightheaded Sudden eye pain or change in vision such as blurry vision, seeing halos around lights, vision loss Unusual bruising or bleeding Side effects that usually do not require medical attention (report to your care team if they continue or are bothersome): Constipation Diarrhea Fatigue Nausea Pain, tingling, or  numbness in the hands or feet Swelling of the ankles, hands, or feet This list may not describe all possible side effects. Call your doctor for medical advice about side effects. You may report side effects to FDA at 1-800-FDA-1088. Where should I keep my medication? This medication is given in a hospital or clinic. It will not be stored at home. NOTE: This sheet is a summary. It may not cover all possible information. If you have  questions about this medicine, talk to your doctor, pharmacist, or health care provider.  2024 Elsevier/Gold Standard (2021-10-09 00:00:00)  Bortezomib Injection What is this medication? BORTEZOMIB (bor TEZ oh mib) treats lymphoma. It may also be used to treat multiple myeloma, a type of bone marrow cancer. It works by blocking a protein that causes cancer cells to grow and multiply. This helps to slow or stop the spread of cancer cells. This medicine may be used for other purposes; ask your health care provider or pharmacist if you have questions. COMMON BRAND NAME(S): Velcade What should I tell my care team before I take this medication? They need to know if you have any of these conditions: Dehydration Diabetes Heart disease Liver disease Tingling of the fingers or toes or other nerve disorder An unusual or allergic reaction to bortezomib, other medications, foods, dyes, or preservatives If you or your partner are pregnant or trying to get pregnant Breastfeeding How should I use this medication? This medication is injected into a vein or under the skin. It is given by your care team in a hospital or clinic setting. Talk to your care team about the use of this medication in children. Special care may be needed. Overdosage: If you think you have taken too much of this medicine contact a poison control center or emergency room at once. NOTE: This medicine is only for you. Do not share this medicine with others. What if I miss a dose? Keep appointments for follow-up doses. It is important not to miss your dose. Call your care team if you are unable to keep an appointment. What may interact with this medication? Ketoconazole Rifampin This list may not describe all possible interactions. Give your health care provider a list of all the medicines, herbs, non-prescription drugs, or dietary supplements you use. Also tell them if you smoke, drink alcohol, or use illegal drugs. Some items may  interact with your medicine. What should I watch for while using this medication? Your condition will be monitored carefully while you are receiving this medication. You may need blood work while taking this medication. This medication may affect your coordination, reaction time, or judgment. Do not drive or operate machinery until you know how this medication affects you. Sit up or stand slowly to reduce the risk of dizzy or fainting spells. Drinking alcohol with this medication can increase the risk of these side effects. This medication may increase your risk of getting an infection. Call your care team for advice if you get a fever, chills, sore throat, or other symptoms of a cold or flu. Do not treat yourself. Try to avoid being around people who are sick. Check with your care team if you have severe diarrhea, nausea, and vomiting, or if you sweat a lot. The loss of too much body fluid may make it dangerous for you to take this medication. Talk to your care team if you may be pregnant. Serious birth defects can occur if you take this medication during pregnancy and for 7 months after the  last dose. You will need a negative pregnancy test before starting this medication. Contraception is recommended while taking this medication and for 7 months after the last dose. Your care team can help you find the option that works for you. If your partner can get pregnant, use a condom during sex while taking this medication and for 4 months after the last dose. Do not breastfeed while taking this medication and for 2 months after the last dose. This medication may cause infertility. Talk to your care team if you are concerned about your fertility. What side effects may I notice from receiving this medication? Side effects that you should report to your care team as soon as possible: Allergic reactions--skin rash, itching, hives, swelling of the face, lips, tongue, or throat Bleeding--bloody or black, tar-like  stools, vomiting blood or brown material that looks like coffee grounds, red or dark brown urine, small red or purple spots on skin, unusual bruising or bleeding Bleeding in the brain--severe headache, stiff neck, confusion, dizziness, change in vision, numbness or weakness of the face, arm, or leg, trouble speaking, trouble walking, vomiting Bowel blockage--stomach cramping, unable to have a bowel movement or pass gas, loss of appetite, vomiting Heart failure--shortness of breath, swelling of the ankles, feet, or hands, sudden weight gain, unusual weakness or fatigue Infection--fever, chills, cough, sore throat, wounds that don't heal, pain or trouble when passing urine, general feeling of discomfort or being unwell Liver injury--right upper belly pain, loss of appetite, nausea, light-colored stool, dark yellow or brown urine, yellowing skin or eyes, unusual weakness or fatigue Low blood pressure--dizziness, feeling faint or lightheaded, blurry vision Lung injury--shortness of breath or trouble breathing, cough, spitting up blood, chest pain, fever Pain, tingling, or numbness in the hands or feet Severe or prolonged diarrhea Stomach pain, bloody diarrhea, pale skin, unusual weakness or fatigue, decrease in the amount of urine, which may be signs of hemolytic uremic syndrome Sudden and severe headache, confusion, change in vision, seizures, which may be signs of posterior reversible encephalopathy syndrome (PRES) TTP--purple spots on the skin or inside the mouth, pale skin, yellowing skin or eyes, unusual weakness or fatigue, fever, fast or irregular heartbeat, confusion, change in vision, trouble speaking, trouble walking Tumor lysis syndrome (TLS)--nausea, vomiting, diarrhea, decrease in the amount of urine, dark urine, unusual weakness or fatigue, confusion, muscle pain or cramps, fast or irregular heartbeat, joint pain Side effects that usually do not require medical attention (report to your care  team if they continue or are bothersome): Constipation Diarrhea Fatigue Loss of appetite Nausea This list may not describe all possible side effects. Call your doctor for medical advice about side effects. You may report side effects to FDA at 1-800-FDA-1088. Where should I keep my medication? This medication is given in a hospital or clinic. It will not be stored at home. NOTE: This sheet is a summary. It may not cover all possible information. If you have questions about this medicine, talk to your doctor, pharmacist, or health care provider.  2024 Elsevier/Gold Standard (2021-11-06 00:00:00)

## 2023-03-31 NOTE — Progress Notes (Signed)
[place]. 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 Benign cyst in the liver. Musculoskeletal: Posterior thoracolumbar fusion. Destruction of the T6 and T7 vertebral body by paraspinal mass as described in lung section IMPRESSION: 1. Interval decrease in pleural effusion following LEFT chest tube placement. 2. Persistent bibasilar atelectasis versus infiltrates. 3. Aggressive LEFT paraspinal mass which invades the adjacent vertebral body, encroaches upon the central canal, and erodes the medial LEFT seventh rib. Pathology indicates plasmacytoma. Electronically Signed   By: Genevive Bi M.D.   On: 03/11/2023 12:53   DG Chest Port 1 View  Result Date: 03/10/2023 CLINICAL DATA:  Chest tube. EXAM: PORTABLE CHEST 1 VIEW COMPARISON:  March 09, 2023. FINDINGS: Stable cardiomediastinal silhouette. Left-sided pleural drainage catheter is unchanged. No definite pneumothorax is noted. Stable bilateral pleural effusions with associated atelectasis. Status post surgical posterior fusion of thoracic spine. IMPRESSION: Stable position of left-sided pleural drainage catheter. Stable small bilateral pleural effusions with associated atelectasis. Electronically Signed   By: Lupita Raider M.D.   On: 03/10/2023 13:59   DG Chest Port 1  View  Result Date: 03/09/2023 CLINICAL DATA:  Chest tube placement EXAM: PORTABLE CHEST 1 VIEW COMPARISON:  8 a.m. FINDINGS: Left basilar pigtail chest tube has been placed in the interval with partial evacuation of left-sided pleural effusion. Small residual pleural fluid is suspected, possibly loculated. There is residual retrocardiac consolidation in keeping with atelectasis, infiltrate, or loculated pleural fluid within this region. Moderate right pleural effusion is unchanged. Cardiomegaly is stable. Pulmonary vascularity is normal. No acute bone abnormality. Thoracic fusion hardware again noted. IMPRESSION: 1. Interval placement of left basilar pigtail chest tube with partial evacuation of left-sided pleural effusion. Small residual pleural fluid is suspected, possibly loculated. 2. Residual retrocardiac consolidation in keeping with atelectasis, infiltrate, or loculated pleural fluid within this region. 3. Stable moderate right pleural effusion. Electronically Signed   By: Helyn Numbers M.D.   On: 03/09/2023 21:18   DG Chest Port 1 View  Result Date: 03/09/2023 CLINICAL DATA:  10026 Shortness of breath 10026 EXAM: PORTABLE CHEST 1 VIEW COMPARISON:  March 07, 2023 FINDINGS: The cardiomediastinal silhouette is unchanged in contour. Bilateral pleural effusions, LEFT greater than RIGHT. No pneumothorax. Bibasilar homogeneous opacities within more focal bandlike opacity in LEFT mid lung. Status post posterior fixation of the thoracic spine. IMPRESSION: 1. Bilateral pleural effusions, LEFT greater than RIGHT. 2. Bibasilar opacities likely reflect atelectasis. Electronically Signed   By: Meda Klinefelter M.D.   On: 03/09/2023 10:15   ECHOCARDIOGRAM COMPLETE  Result Date: 03/08/2023    ECHOCARDIOGRAM REPORT   Patient Name:   ANYSIA Garrett Date of Exam: 03/08/2023 Medical Rec #:  [id number]      Height:       62.0 in Accession #:    [id number]     Weight:       204.4 lb Date of Birth:  04/24/1966       BSA:          1.929 m Patient Age:    57 years       BP:           121/65 mmHg Patient Gender: F              HR:           63 bpm. 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 She was last seen by me in April 2024 and at that time she had complained of mild left chest wall pain radiating to her back.  She underwent cardiac workup and I had done a bone survey at that time which did not show Paula evidence of bone disease.  Her smoldering multiple myeloma at that time was stable.  Subsequently she has had chest x-ray in May 2024 as well as a rib x-ray in July 2024.  None of these showed Paula abnormalities.  Her pain has gradually worsened in the last 2 months but especially so in the last 1 week.  She presented to the ER with symptoms of pleuritic chest wall pain and shortness of breath and underwent a CT angio chest which showed a soft tissue mass with bone destruction involving the left seventh rib as well as T6 and T7 vertebral bodies and central canal.  Possibility of cord compression.  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   Results of myeloma work-up from 06/20/2020  showed an elevated IgG of 3668.  M protein was elevated at 3.1 and IFE showed IgG monoclonal lambda protein.  Beta-2 microglobulin and LDH were normal.  Serum free light chain ratio was elevated at 25 with a free light chain lambda of 303.  Bone marrow biopsy showed increased plasma cells 22% by manual count and 20% by IHC staining for CD138.  Cytogenetics for myeloma showed gain of 1 q.  PET CT scan was not approved by insurance.  MRI cervical and lumbar thoracic spine did not show Paula evidence of lytic lesions.  Bone survey was negative for bone lesions as well.  MRI pelvis with and without contrast showed diffuse patchy heterogeneous marrow concerning for myeloma    Patient had a repeat bone marrow biopsy inAugust 2024 which showed 20% overall plasma cells and hypercellular marrow consistent with plasma cell myeloma.  Cytogenetics normal and FISH studies did not show Paula abnormalities.  Patient had stabilization procedure/spinal fusion T4-T9 with T6-T7 laminotomies.  Spinal mass biopsy was consistent with plasma cell neoplasm. PET CT scan showed solitary hypermetabolic lytic lesion in the L4 vertebral body consistent with multiple myeloma.  Residual soft tissue density at the left paraspinal resection site T6-T7 with no significant metabolic activity.   Plan was to do outpatient daratumumab Revlimid Velcade dexamethasone regimen.  However patient was found to be in acute kidney failure with a creatinine of 5 in August 2024 and was sent to the ER.  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 She was last seen by me in April 2024 and at that time she had complained of mild left chest wall pain radiating to her back.  She underwent cardiac workup and I had done a bone survey at that time which did not show Paula evidence of bone disease.  Her smoldering multiple myeloma at that time was stable.  Subsequently she has had chest x-ray in May 2024 as well as a rib x-ray in July 2024.  None of these showed Paula abnormalities.  Her pain has gradually worsened in the last 2 months but especially so in the last 1 week.  She presented to the ER with symptoms of pleuritic chest wall pain and shortness of breath and underwent a CT angio chest which showed a soft tissue mass with bone destruction involving the left seventh rib as well as T6 and T7 vertebral bodies and central canal.  Possibility of cord compression.  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   Results of myeloma work-up from 06/20/2020  showed an elevated IgG of 3668.  M protein was elevated at 3.1 and IFE showed IgG monoclonal lambda protein.  Beta-2 microglobulin and LDH were normal.  Serum free light chain ratio was elevated at 25 with a free light chain lambda of 303.  Bone marrow biopsy showed increased plasma cells 22% by manual count and 20% by IHC staining for CD138.  Cytogenetics for myeloma showed gain of 1 q.  PET CT scan was not approved by insurance.  MRI cervical and lumbar thoracic spine did not show Paula evidence of lytic lesions.  Bone survey was negative for bone lesions as well.  MRI pelvis with and without contrast showed diffuse patchy heterogeneous marrow concerning for myeloma    Patient had a repeat bone marrow biopsy inAugust 2024 which showed 20% overall plasma cells and hypercellular marrow consistent with plasma cell myeloma.  Cytogenetics normal and FISH studies did not show Paula abnormalities.  Patient had stabilization procedure/spinal fusion T4-T9 with T6-T7 laminotomies.  Spinal mass biopsy was consistent with plasma cell neoplasm. PET CT scan showed solitary hypermetabolic lytic lesion in the L4 vertebral body consistent with multiple myeloma.  Residual soft tissue density at the left paraspinal resection site T6-T7 with no significant metabolic activity.   Plan was to do outpatient daratumumab Revlimid Velcade dexamethasone regimen.  However patient was found to be in acute kidney failure with a creatinine of 5 in August 2024 and was sent to the ER.  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 She was last seen by me in April 2024 and at that time she had complained of mild left chest wall pain radiating to her back.  She underwent cardiac workup and I had done a bone survey at that time which did not show Paula evidence of bone disease.  Her smoldering multiple myeloma at that time was stable.  Subsequently she has had chest x-ray in May 2024 as well as a rib x-ray in July 2024.  None of these showed Paula abnormalities.  Her pain has gradually worsened in the last 2 months but especially so in the last 1 week.  She presented to the ER with symptoms of pleuritic chest wall pain and shortness of breath and underwent a CT angio chest which showed a soft tissue mass with bone destruction involving the left seventh rib as well as T6 and T7 vertebral bodies and central canal.  Possibility of cord compression.  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   Results of myeloma work-up from 06/20/2020  showed an elevated IgG of 3668.  M protein was elevated at 3.1 and IFE showed IgG monoclonal lambda protein.  Beta-2 microglobulin and LDH were normal.  Serum free light chain ratio was elevated at 25 with a free light chain lambda of 303.  Bone marrow biopsy showed increased plasma cells 22% by manual count and 20% by IHC staining for CD138.  Cytogenetics for myeloma showed gain of 1 q.  PET CT scan was not approved by insurance.  MRI cervical and lumbar thoracic spine did not show Paula evidence of lytic lesions.  Bone survey was negative for bone lesions as well.  MRI pelvis with and without contrast showed diffuse patchy heterogeneous marrow concerning for myeloma    Patient had a repeat bone marrow biopsy inAugust 2024 which showed 20% overall plasma cells and hypercellular marrow consistent with plasma cell myeloma.  Cytogenetics normal and FISH studies did not show Paula abnormalities.  Patient had stabilization procedure/spinal fusion T4-T9 with T6-T7 laminotomies.  Spinal mass biopsy was consistent with plasma cell neoplasm. PET CT scan showed solitary hypermetabolic lytic lesion in the L4 vertebral body consistent with multiple myeloma.  Residual soft tissue density at the left paraspinal resection site T6-T7 with no significant metabolic activity.   Plan was to do outpatient daratumumab Revlimid Velcade dexamethasone regimen.  However patient was found to be in acute kidney failure with a creatinine of 5 in August 2024 and was sent to the ER.  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 She was last seen by me in April 2024 and at that time she had complained of mild left chest wall pain radiating to her back.  She underwent cardiac workup and I had done a bone survey at that time which did not show Paula evidence of bone disease.  Her smoldering multiple myeloma at that time was stable.  Subsequently she has had chest x-ray in May 2024 as well as a rib x-ray in July 2024.  None of these showed Paula abnormalities.  Her pain has gradually worsened in the last 2 months but especially so in the last 1 week.  She presented to the ER with symptoms of pleuritic chest wall pain and shortness of breath and underwent a CT angio chest which showed a soft tissue mass with bone destruction involving the left seventh rib as well as T6 and T7 vertebral bodies and central canal.  Possibility of cord compression.  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   Results of myeloma work-up from 06/20/2020  showed an elevated IgG of 3668.  M protein was elevated at 3.1 and IFE showed IgG monoclonal lambda protein.  Beta-2 microglobulin and LDH were normal.  Serum free light chain ratio was elevated at 25 with a free light chain lambda of 303.  Bone marrow biopsy showed increased plasma cells 22% by manual count and 20% by IHC staining for CD138.  Cytogenetics for myeloma showed gain of 1 q.  PET CT scan was not approved by insurance.  MRI cervical and lumbar thoracic spine did not show Paula evidence of lytic lesions.  Bone survey was negative for bone lesions as well.  MRI pelvis with and without contrast showed diffuse patchy heterogeneous marrow concerning for myeloma    Patient had a repeat bone marrow biopsy inAugust 2024 which showed 20% overall plasma cells and hypercellular marrow consistent with plasma cell myeloma.  Cytogenetics normal and FISH studies did not show Paula abnormalities.  Patient had stabilization procedure/spinal fusion T4-T9 with T6-T7 laminotomies.  Spinal mass biopsy was consistent with plasma cell neoplasm. PET CT scan showed solitary hypermetabolic lytic lesion in the L4 vertebral body consistent with multiple myeloma.  Residual soft tissue density at the left paraspinal resection site T6-T7 with no significant metabolic activity.   Plan was to do outpatient daratumumab Revlimid Velcade dexamethasone regimen.  However patient was found to be in acute kidney failure with a creatinine of 5 in August 2024 and was sent to the ER.  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 She was last seen by me in April 2024 and at that time she had complained of mild left chest wall pain radiating to her back.  She underwent cardiac workup and I had done a bone survey at that time which did not show Paula evidence of bone disease.  Her smoldering multiple myeloma at that time was stable.  Subsequently she has had chest x-ray in May 2024 as well as a rib x-ray in July 2024.  None of these showed Paula abnormalities.  Her pain has gradually worsened in the last 2 months but especially so in the last 1 week.  She presented to the ER with symptoms of pleuritic chest wall pain and shortness of breath and underwent a CT angio chest which showed a soft tissue mass with bone destruction involving the left seventh rib as well as T6 and T7 vertebral bodies and central canal.  Possibility of cord compression.  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   Results of myeloma work-up from 06/20/2020  showed an elevated IgG of 3668.  M protein was elevated at 3.1 and IFE showed IgG monoclonal lambda protein.  Beta-2 microglobulin and LDH were normal.  Serum free light chain ratio was elevated at 25 with a free light chain lambda of 303.  Bone marrow biopsy showed increased plasma cells 22% by manual count and 20% by IHC staining for CD138.  Cytogenetics for myeloma showed gain of 1 q.  PET CT scan was not approved by insurance.  MRI cervical and lumbar thoracic spine did not show Paula evidence of lytic lesions.  Bone survey was negative for bone lesions as well.  MRI pelvis with and without contrast showed diffuse patchy heterogeneous marrow concerning for myeloma    Patient had a repeat bone marrow biopsy inAugust 2024 which showed 20% overall plasma cells and hypercellular marrow consistent with plasma cell myeloma.  Cytogenetics normal and FISH studies did not show Paula abnormalities.  Patient had stabilization procedure/spinal fusion T4-T9 with T6-T7 laminotomies.  Spinal mass biopsy was consistent with plasma cell neoplasm. PET CT scan showed solitary hypermetabolic lytic lesion in the L4 vertebral body consistent with multiple myeloma.  Residual soft tissue density at the left paraspinal resection site T6-T7 with no significant metabolic activity.   Plan was to do outpatient daratumumab Revlimid Velcade dexamethasone regimen.  However patient was found to be in acute kidney failure with a creatinine of 5 in August 2024 and was sent to the ER.  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 She was last seen by me in April 2024 and at that time she had complained of mild left chest wall pain radiating to her back.  She underwent cardiac workup and I had done a bone survey at that time which did not show Paula evidence of bone disease.  Her smoldering multiple myeloma at that time was stable.  Subsequently she has had chest x-ray in May 2024 as well as a rib x-ray in July 2024.  None of these showed Paula abnormalities.  Her pain has gradually worsened in the last 2 months but especially so in the last 1 week.  She presented to the ER with symptoms of pleuritic chest wall pain and shortness of breath and underwent a CT angio chest which showed a soft tissue mass with bone destruction involving the left seventh rib as well as T6 and T7 vertebral bodies and central canal.  Possibility of cord compression.  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   Results of myeloma work-up from 06/20/2020  showed an elevated IgG of 3668.  M protein was elevated at 3.1 and IFE showed IgG monoclonal lambda protein.  Beta-2 microglobulin and LDH were normal.  Serum free light chain ratio was elevated at 25 with a free light chain lambda of 303.  Bone marrow biopsy showed increased plasma cells 22% by manual count and 20% by IHC staining for CD138.  Cytogenetics for myeloma showed gain of 1 q.  PET CT scan was not approved by insurance.  MRI cervical and lumbar thoracic spine did not show Paula evidence of lytic lesions.  Bone survey was negative for bone lesions as well.  MRI pelvis with and without contrast showed diffuse patchy heterogeneous marrow concerning for myeloma    Patient had a repeat bone marrow biopsy inAugust 2024 which showed 20% overall plasma cells and hypercellular marrow consistent with plasma cell myeloma.  Cytogenetics normal and FISH studies did not show Paula abnormalities.  Patient had stabilization procedure/spinal fusion T4-T9 with T6-T7 laminotomies.  Spinal mass biopsy was consistent with plasma cell neoplasm. PET CT scan showed solitary hypermetabolic lytic lesion in the L4 vertebral body consistent with multiple myeloma.  Residual soft tissue density at the left paraspinal resection site T6-T7 with no significant metabolic activity.   Plan was to do outpatient daratumumab Revlimid Velcade dexamethasone regimen.  However patient was found to be in acute kidney failure with a creatinine of 5 in August 2024 and was sent to the ER.  She received cycle 1 of CyBorD in the hospital with  canal. 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 Benign cyst in the liver. Musculoskeletal: Posterior thoracolumbar fusion. Destruction of the T6 and T7 vertebral body by paraspinal mass as described in lung section IMPRESSION: 1. Interval decrease in pleural effusion following LEFT chest tube placement. 2. Persistent bibasilar atelectasis versus infiltrates. 3. Aggressive LEFT paraspinal mass which invades the adjacent vertebral body, encroaches upon the central canal, and erodes the medial LEFT seventh rib. Pathology indicates plasmacytoma. Electronically Signed   By: Genevive Bi M.D.   On: 03/11/2023 12:53   DG Chest Port 1 View  Result Date: 03/10/2023 CLINICAL DATA:  Chest tube. EXAM: PORTABLE CHEST 1 VIEW COMPARISON:  March 09, 2023. FINDINGS: Stable cardiomediastinal silhouette. Left-sided pleural drainage catheter is unchanged. No definite pneumothorax is noted. Stable bilateral pleural effusions with associated atelectasis. Status post surgical posterior fusion of thoracic spine. IMPRESSION: Stable position of left-sided pleural drainage catheter. Stable small bilateral pleural effusions with associated atelectasis. Electronically Signed   By: Lupita Raider M.D.   On: 03/10/2023 13:59   DG Chest Port 1  View  Result Date: 03/09/2023 CLINICAL DATA:  Chest tube placement EXAM: PORTABLE CHEST 1 VIEW COMPARISON:  8 a.m. FINDINGS: Left basilar pigtail chest tube has been placed in the interval with partial evacuation of left-sided pleural effusion. Small residual pleural fluid is suspected, possibly loculated. There is residual retrocardiac consolidation in keeping with atelectasis, infiltrate, or loculated pleural fluid within this region. Moderate right pleural effusion is unchanged. Cardiomegaly is stable. Pulmonary vascularity is normal. No acute bone abnormality. Thoracic fusion hardware again noted. IMPRESSION: 1. Interval placement of left basilar pigtail chest tube with partial evacuation of left-sided pleural effusion. Small residual pleural fluid is suspected, possibly loculated. 2. Residual retrocardiac consolidation in keeping with atelectasis, infiltrate, or loculated pleural fluid within this region. 3. Stable moderate right pleural effusion. Electronically Signed   By: Helyn Numbers M.D.   On: 03/09/2023 21:18   DG Chest Port 1 View  Result Date: 03/09/2023 CLINICAL DATA:  10026 Shortness of breath 10026 EXAM: PORTABLE CHEST 1 VIEW COMPARISON:  March 07, 2023 FINDINGS: The cardiomediastinal silhouette is unchanged in contour. Bilateral pleural effusions, LEFT greater than RIGHT. No pneumothorax. Bibasilar homogeneous opacities within more focal bandlike opacity in LEFT mid lung. Status post posterior fixation of the thoracic spine. IMPRESSION: 1. Bilateral pleural effusions, LEFT greater than RIGHT. 2. Bibasilar opacities likely reflect atelectasis. Electronically Signed   By: Meda Klinefelter M.D.   On: 03/09/2023 10:15   ECHOCARDIOGRAM COMPLETE  Result Date: 03/08/2023    ECHOCARDIOGRAM REPORT   Patient Name:   ANYSIA Garrett Date of Exam: 03/08/2023 Medical Rec #:  [id number]      Height:       62.0 in Accession #:    [id number]     Weight:       204.4 lb Date of Birth:  04/24/1966       BSA:          1.929 m Patient Age:    57 years       BP:           121/65 mmHg Patient Gender: F              HR:           63 bpm. 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 She was last seen by me in April 2024 and at that time she had complained of mild left chest wall pain radiating to her back.  She underwent cardiac workup and I had done a bone survey at that time which did not show Paula evidence of bone disease.  Her smoldering multiple myeloma at that time was stable.  Subsequently she has had chest x-ray in May 2024 as well as a rib x-ray in July 2024.  None of these showed Paula abnormalities.  Her pain has gradually worsened in the last 2 months but especially so in the last 1 week.  She presented to the ER with symptoms of pleuritic chest wall pain and shortness of breath and underwent a CT angio chest which showed a soft tissue mass with bone destruction involving the left seventh rib as well as T6 and T7 vertebral bodies and central canal.  Possibility of cord compression.  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   Results of myeloma work-up from 06/20/2020  showed an elevated IgG of 3668.  M protein was elevated at 3.1 and IFE showed IgG monoclonal lambda protein.  Beta-2 microglobulin and LDH were normal.  Serum free light chain ratio was elevated at 25 with a free light chain lambda of 303.  Bone marrow biopsy showed increased plasma cells 22% by manual count and 20% by IHC staining for CD138.  Cytogenetics for myeloma showed gain of 1 q.  PET CT scan was not approved by insurance.  MRI cervical and lumbar thoracic spine did not show Paula evidence of lytic lesions.  Bone survey was negative for bone lesions as well.  MRI pelvis with and without contrast showed diffuse patchy heterogeneous marrow concerning for myeloma    Patient had a repeat bone marrow biopsy inAugust 2024 which showed 20% overall plasma cells and hypercellular marrow consistent with plasma cell myeloma.  Cytogenetics normal and FISH studies did not show Paula abnormalities.  Patient had stabilization procedure/spinal fusion T4-T9 with T6-T7 laminotomies.  Spinal mass biopsy was consistent with plasma cell neoplasm. PET CT scan showed solitary hypermetabolic lytic lesion in the L4 vertebral body consistent with multiple myeloma.  Residual soft tissue density at the left paraspinal resection site T6-T7 with no significant metabolic activity.   Plan was to do outpatient daratumumab Revlimid Velcade dexamethasone regimen.  However patient was found to be in acute kidney failure with a creatinine of 5 in August 2024 and was sent to the ER.  She received cycle 1 of CyBorD in the hospital with  canal. 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 Benign cyst in the liver. Musculoskeletal: Posterior thoracolumbar fusion. Destruction of the T6 and T7 vertebral body by paraspinal mass as described in lung section IMPRESSION: 1. Interval decrease in pleural effusion following LEFT chest tube placement. 2. Persistent bibasilar atelectasis versus infiltrates. 3. Aggressive LEFT paraspinal mass which invades the adjacent vertebral body, encroaches upon the central canal, and erodes the medial LEFT seventh rib. Pathology indicates plasmacytoma. Electronically Signed   By: Genevive Bi M.D.   On: 03/11/2023 12:53   DG Chest Port 1 View  Result Date: 03/10/2023 CLINICAL DATA:  Chest tube. EXAM: PORTABLE CHEST 1 VIEW COMPARISON:  March 09, 2023. FINDINGS: Stable cardiomediastinal silhouette. Left-sided pleural drainage catheter is unchanged. No definite pneumothorax is noted. Stable bilateral pleural effusions with associated atelectasis. Status post surgical posterior fusion of thoracic spine. IMPRESSION: Stable position of left-sided pleural drainage catheter. Stable small bilateral pleural effusions with associated atelectasis. Electronically Signed   By: Lupita Raider M.D.   On: 03/10/2023 13:59   DG Chest Port 1  View  Result Date: 03/09/2023 CLINICAL DATA:  Chest tube placement EXAM: PORTABLE CHEST 1 VIEW COMPARISON:  8 a.m. FINDINGS: Left basilar pigtail chest tube has been placed in the interval with partial evacuation of left-sided pleural effusion. Small residual pleural fluid is suspected, possibly loculated. There is residual retrocardiac consolidation in keeping with atelectasis, infiltrate, or loculated pleural fluid within this region. Moderate right pleural effusion is unchanged. Cardiomegaly is stable. Pulmonary vascularity is normal. No acute bone abnormality. Thoracic fusion hardware again noted. IMPRESSION: 1. Interval placement of left basilar pigtail chest tube with partial evacuation of left-sided pleural effusion. Small residual pleural fluid is suspected, possibly loculated. 2. Residual retrocardiac consolidation in keeping with atelectasis, infiltrate, or loculated pleural fluid within this region. 3. Stable moderate right pleural effusion. Electronically Signed   By: Helyn Numbers M.D.   On: 03/09/2023 21:18   DG Chest Port 1 View  Result Date: 03/09/2023 CLINICAL DATA:  10026 Shortness of breath 10026 EXAM: PORTABLE CHEST 1 VIEW COMPARISON:  March 07, 2023 FINDINGS: The cardiomediastinal silhouette is unchanged in contour. Bilateral pleural effusions, LEFT greater than RIGHT. No pneumothorax. Bibasilar homogeneous opacities within more focal bandlike opacity in LEFT mid lung. Status post posterior fixation of the thoracic spine. IMPRESSION: 1. Bilateral pleural effusions, LEFT greater than RIGHT. 2. Bibasilar opacities likely reflect atelectasis. Electronically Signed   By: Meda Klinefelter M.D.   On: 03/09/2023 10:15   ECHOCARDIOGRAM COMPLETE  Result Date: 03/08/2023    ECHOCARDIOGRAM REPORT   Patient Name:   ANYSIA Garrett Date of Exam: 03/08/2023 Medical Rec #:  [id number]      Height:       62.0 in Accession #:    [id number]     Weight:       204.4 lb Date of Birth:  04/24/1966       BSA:          1.929 m Patient Age:    57 years       BP:           121/65 mmHg Patient Gender: F              HR:           63 bpm. Exam Location:  [facility] Procedure: 2D Echo Indications:     Pleural effusion  History:

## 2023-03-31 NOTE — Telephone Encounter (Signed)
Called [facility] to follow up on Lenalidomide prescription. Informed by representative that patient was now required to register with [employer] in order to process medication. I met with patient in clinic and provided her with the phone number to register with [employer] [phone]. Patient called and registered and they obtained a co-pay card from [facility] [employer] even though patient's co-pay is $0.00. I called [facility] back and provided co-pay card information (see additional encounter) and let them know she was now registered with [employer]. Confirmed paid claim of $0.00 with representative, and informed they would be reaching out to patient to schedule fill in 24 to 48 hours.  I have spoken with the patient.    Ardeen Fillers, CPhT Oncology Pharmacy Patient Advocate  [facility]  [phone] (phone) [phone] (fax) 03/31/2023 1:38 PM

## 2023-04-01 ENCOUNTER — Ambulatory Visit
Admission: RE | Admit: 2023-04-01 | Discharge: 2023-04-01 | Disposition: A | Discharge status: Home / Self Care | Payer: Managed Care, Other (non HMO) | Source: Ambulatory Visit | Attending: Student in an Organized Health Care Education/Training Program | Admitting: Student in an Organized Health Care Education/Training Program

## 2023-04-01 ENCOUNTER — Ambulatory Visit: Payer: Managed Care, Other (non HMO) | Admitting: Student in an Organized Health Care Education/Training Program

## 2023-04-01 ENCOUNTER — Encounter: Payer: Self-pay | Admitting: Student in an Organized Health Care Education/Training Program

## 2023-04-01 VITALS — BP 120/74 | HR 104 | Temp 97.6°F | Ht 62.0 in | Wt 167.0 lb

## 2023-04-01 DIAGNOSIS — C9000 Multiple myeloma not having achieved remission: Secondary | ICD-10-CM

## 2023-04-01 DIAGNOSIS — C9020 Extramedullary plasmacytoma not having achieved remission: Secondary | ICD-10-CM | POA: Diagnosis present

## 2023-04-01 DIAGNOSIS — J15212 Pneumonia due to Methicillin resistant Staphylococcus aureus: Secondary | ICD-10-CM | POA: Diagnosis not present

## 2023-04-01 DIAGNOSIS — J9621 Acute and chronic respiratory failure with hypoxia: Secondary | ICD-10-CM | POA: Diagnosis present

## 2023-04-01 NOTE — Progress Notes (Signed)
Assessment & Plan:   #Left Sided Pleural Effusion #Acute Hypoxic Respiratory Failure #Multiple Myeloma #Plasmacytoma   Patient initially seen for shortness of breath, cough, and wheeze that was concerning for Asthma vs COPD based on symptoms. She was pending PFT's and was empirically initiated on Breztri with some improvement in symptoms.   She then presented to the hospital with chest pain, diagnosed with a plasmacytoma, and subsequently diagnosed with multiple myeloma. She was planned for initiation of chemotherapy but had to be re-admitted with worsening respiratory failure and found to have a new left sided pleural effusion. Pleural fluid analysis noted an exudate with lymphocyte predominance. Cholesterol was elevated at 84 mg/dL, LDL at 74 U/L and triglycerides normal at 24 mg/dL. Given she was admitted with concern for pneumonia, the differential for this pleural effusion included a parapneumonic effusion, an effusion associated with the plasmacytoma, and less likely an effusion secondary to a CSF leak given recent surgery. She underwent thoracentesis and subsequently chest tube placement with drainage and improvement. Respiratory cultures while inpatient grew MRSA (sputum) and candida (Bronchoscopy).  Today, she is reporting significant improvement in her symptoms. Her dyspnea is much improved, as are her other symptoms. She hasn't used any of her inhalers since our last visit. Patient has not been using her oxygen during the day and feels well. We did trend her on room air today, and her oxygen level was 96%. I have asked her to stop using her oxygen with rest or exertion, and to continue using it nocturnally until we do overnight oxymetry. Should this not show hypoxia, we will discontinue oxygen therapy. She is at risk for OSA and this could confound this result - I would certainly revisit in the future as well.  As to the pleural effusion, repeat CXR today showing significant improvement  in the infiltrate both on the right and on the left. I will see her for follow up in 4 weeks at which point we'll order a chest CT to re-evaluate the right and left pleural spaces. I suspect that the effusion is most likely driven by the plasmacytoma and with control of her disease this will improve. Will continue to monitor for the development of any treatment related side effects, however.  -PFT's when able, will delay until MM controlled -Continue Breztri twice daily - DG Chest 2 View; Future - Pulse oximetry, overnight; Future   Return in about 4 weeks (around 04/29/2023).  I spent 30 minutes caring for this patient today, including preparing to see the patient, obtaining a medical history , reviewing a separately obtained history, performing a medically appropriate examination and/or evaluation, counseling and educating the patient/family/caregiver, ordering medications, tests, or procedures, and documenting clinical information in the electronic health record  Paula Chute, MD [facility] 04/01/2023 4:32 PM    End of visit medications:  No orders of the defined types were placed in this encounter.    Current Outpatient Medications:    acyclovir (ZOVIRAX) 400 MG tablet, Take 1 tablet (400 mg total) by mouth 2 (two) times daily., Disp: 60 tablet, Rfl: 5   amLODipine (NORVASC) 10 MG tablet, Take 1 tablet (10 mg total) by mouth daily., Disp: 90 tablet, Rfl: 3   aspirin EC 81 MG tablet, Take 1 tablet (81 mg total) by mouth daily. Swallow whole., Disp: 30 tablet, Rfl: 2   aspirin EC 81 MG tablet, Take 1 tablet (81 mg total) by mouth daily., Disp: 100 tablet, Rfl: 3   butalbital-acetaminophen-caffeine (FIORICET) 50-325-40  MG tablet, Take 1 tablet by mouth every 4 (four) hours as needed for headache., Disp: 14 tablet, Rfl: 0   clonazePAM (KLONOPIN) 1 MG tablet, TAKE 1 TABLET(1 MG) BY MOUTH TWICE DAILY, Disp: 60 tablet, Rfl: 2   cyanocobalamin (VITAMIN B12) 1000 MCG/ML  injection, INJECT 1 ML IN THE MUSCLE EVERY 30 DAYS, Disp: 4 mL, Rfl: 12   cyclobenzaprine (FLEXERIL) 10 MG tablet, Take 0.5-1 tablets (5-10 mg total) by mouth 3 (three) times daily as needed for muscle spasms., Disp: 60 tablet, Rfl: 1   dexamethasone (DECADRON) 4 MG tablet, Take 5 tabs (20 mg) weekly the day after daratumumab for 12 weeks. Take with breakfast., Disp: 20 tablet, Rfl: 5   docusate sodium (COLACE) 100 MG capsule, Take 1 capsule (100 mg total) by mouth 2 (two) times daily., Disp: 10 capsule, Rfl: 0   escitalopram (LEXAPRO) 10 MG tablet, Take 1 tablet (10 mg total) by mouth daily., Disp: 30 tablet, Rfl: 2   fentaNYL (DURAGESIC) 50 MCG/HR, Place 1 patch onto the skin every 3 (three) days., Disp: 5 patch, Rfl: 0   gabapentin (NEURONTIN) 100 MG capsule, Take 1 capsule (100 mg total) by mouth 3 (three) times daily., Disp: 30 capsule, Rfl: 0   HYDROmorphone (DILAUDID) 2 MG tablet, Take 1-2 tablets (2-4 mg total) by mouth every 6 (six) hours as needed for severe pain., Disp: 60 tablet, Rfl: 0   ipratropium-albuterol (DUONEB) 0.5-2.5 (3) MG/3ML SOLN, Take 3 mLs by nebulization 4 (four) times daily., Disp: 360 mL, Rfl: 0   lenalidomide (REVLIMID) 10 MG capsule, Take 1 capsule (10 mg total) by mouth daily. Take for 14 days, then hold for 7 days. Repeat every 21 days., Disp: 14 capsule, Rfl: 0   levothyroxine (SYNTHROID) 125 MCG tablet, Take 2 tablets (250 mcg total) by mouth daily before breakfast., Disp: 60 tablet, Rfl: 0   linezolid (ZYVOX) 600 MG tablet, Take 1 tablet (600 mg total) by mouth every 12 (twelve) hours., Disp: 10 tablet, Rfl: 0   ondansetron (ZOFRAN) 8 MG tablet, Take 1 tablet (8 mg total) by mouth every 8 (eight) hours as needed for nausea or vomiting., Disp: 30 tablet, Rfl: 1   pantoprazole (PROTONIX) 40 MG tablet, 30 min before lunch or dinner, Disp: 90 tablet, Rfl: 3   polyethylene glycol (MIRALAX / GLYCOLAX) 17 g packet, Take 17 g by mouth 2 (two) times daily., Disp: 14 each,  Rfl: 0   prochlorperazine (COMPAZINE) 10 MG tablet, Take 1 tablet (10 mg total) by mouth every 6 (six) hours as needed for nausea or vomiting., Disp: 30 tablet, Rfl: 1   senna-docusate (SENOKOT-S) 8.6-50 MG tablet, Take 1 tablet by mouth 2 (two) times daily., Disp: 30 tablet, Rfl: 0   Spacer/Aero-Holding Chambers (AEROCHAMBER MV) inhaler, Use as instructed, Disp: 1 each, Rfl: 0   SYRINGE-NEEDLE, DISP, 3 ML 25G X 1-1/2" 3 ML MISC, 1 Device by Does not apply route every 30 (thirty) days. With B12 shot, Disp: 12 each, Rfl: 1   thiamine (VITAMIN B-1) 100 MG tablet, Take 1 tablet (100 mg total) by mouth daily., Disp: 30 tablet, Rfl: 0   Vitamin D, Ergocalciferol, (DRISDOL) 1.25 MG (50000 UNIT) CAPS capsule, Take 50,000 Units by mouth every 7 (seven) days., Disp: , Rfl:    zolpidem (AMBIEN) 10 MG tablet, TAKE 1/2 TO 1 TABLET(5 TO 10 MG) BY MOUTH AT BEDTIME AS NEEDED FOR SLEEP, Disp: 30 tablet, Rfl: 2   albuterol (VENTOLIN HFA) 108 (90 Base) MCG/ACT inhaler, Inhale 2  puffs into the lungs every 6 (six) hours as needed for wheezing or shortness of breath. (Patient not taking: Reported on 04/01/2023), Disp: 18 g, Rfl: 11   mometasone-formoterol (DULERA) 200-5 MCG/ACT AERO, Inhale 2 puffs into the lungs 2 (two) times daily. (Patient not taking: Reported on 04/01/2023), Disp: 1 each, Rfl: 2   umeclidinium bromide (INCRUSE ELLIPTA) 62.5 MCG/ACT AEPB, Inhale 1 puff into the lungs daily. (Patient not taking: Reported on 04/01/2023), Disp: 30 each, Rfl: 0   Subjective:   PATIENT ID: Paula Garrett GENDER: female DOB: April 05, 1966, MRN: [id number]  Chief Complaint  Patient presents with   Follow-up    Shortness of breath on exertion, cough and wheezing.     HPI  Patient is a pleasant 57 year old female presenting to clinic for follow up.  I last saw Paula Garrett 2 weeks ago following hospital discharge. She is presenting today for follow up and reports significant improvement in her symptoms. She feels very much  improved. She has less dyspnea, less chest discomfort, and reports less cough. She's been measuring her oxygen levels and her oxygenation has been within normal off of oxygen. She continues to use her oxygen nocturnally.  Following discharge, she's been followed closely by Dr. Smith Robert from oncology. She finished her radiation therapy and will be initiated on D-RVD for myeloma directed therapy.   During our initial visit, she'd reported a 6 month history of exertional dyspnea, cough, wheeze, and sputum production. The cough was productive of whitish sputum. She reported a wheeze and a rattling in her chest without any chest pain or tightness.   Since that visit, she presented to the ED with the chief complaint of chest discomfort and chest pain that was described as pleuritic. CXR showed a pleural based nodular opacity in the left hemithorax, and follow up chest CT showed a soft tissue mass at T6-T7 with destruction of the 7th rib and invasion of T6-T7 vertebral bodies and with spinal canal involvement. She was admitted to the hospital and seen by oncology and neurosurgery. She underwent spinal stabilization surgery on 01/28/2023 and the mass diagnosed as a plasmacytoma and diagnosis of multiple myeloma made. No pleural effusion was noted at the time of that admission.   Patient subsequently followed by oncology with plan to initiate chemotherapy.This was complicated by hyponatremia again requiring admission to the hospital. Imaging during said admission is notable for the development of left sided pleural effusion. Patient then had two admissions to the hospital for shortness of breath, found to have the left sided pleural effusion. She initially had a thoracentesis, with re-accumulation, and subsequent placement of a chest tube. Pleural fluid was noted to be exudative, and predominantly lymphocytic. POCUS was also noted for a right sided consolidation. Patient was initiated on broad spectrum antibiotics with  sputum culture growing MRSA. Bronchoscopy was performed while she was in the hospital and respiratory cultures obtained grew candida (1000 colonies). Chest tube was discontinued and her symptoms improved and she was discharged with plans for outpatient follow up.    She has a long standing history of smoking, and has smoked a pack a day for over 30 years. She works as a Industrial/product designer. She doesn't have any other occupational exposures and denies illicit drug use.   Post hospital follow up, had pleural effusion, drained with chest tube, MRSA on culture. On [place] today, right side and left side with trace effusions, will continue to monitor. Radiatio and chemotherapy started.   Ancillary information including  prior medications, full medical/surgical/family/social histories, and PFTs (when available) are listed below and have been reviewed.   Review of Systems  Constitutional:  Negative for chills, fever, malaise/fatigue and weight loss.  Respiratory:  Negative for cough, hemoptysis, sputum production, shortness of breath and wheezing.   Cardiovascular:  Negative for chest pain.  Skin:  Negative for rash.     Objective:   Vitals:   04/01/23 1552  BP: 120/74  Pulse: (!) 104  Temp: 97.6 F (36.4 C)  TempSrc: Temporal  SpO2: 96%  Weight: 167 lb (75.8 kg)  Height: 5\' 2"  (1.575 m)   96% on RA  BMI Readings from Last 3 Encounters:  04/01/23 30.54 kg/m  03/31/23 30.07 kg/m  03/21/23 30.49 kg/m   Wt Readings from Last 3 Encounters:  04/01/23 167 lb (75.8 kg)  03/31/23 164 lb 6.4 oz (74.6 kg)  03/21/23 166 lb 11.2 oz (75.6 kg)    Physical Exam Constitutional:      Appearance: Normal appearance. She is obese.  Eyes:     Pupils: Pupils are equal, round, and reactive to light.  Cardiovascular:     Rate and Rhythm: Normal rate and regular rhythm.  Pulmonary:     Effort: Pulmonary effort is normal.     Breath sounds: Normal breath sounds. No stridor. No wheezing or rales.   Neurological:     General: No focal deficit present.     Mental Status: She is alert and oriented to Garrett, place, and time. Mental status is at baseline.       Ancillary Information    Past Medical History:  Diagnosis Date   Anxiety    Arthritis    Asthma    Barrett's esophagus 2015   COVID-19    03/10/20   Depression    GERD (gastroesophageal reflux disease)    Hypertension    Hypothyroidism    Pneumonia    Smoldering myeloma    Thyroid disease      Family History  Problem Relation Age of Onset   Heart disease Mother    COPD Mother    Arthritis Mother    Cancer Mother        uterine   Lupus Mother    Anxiety disorder Mother    Depression Mother    Drug abuse Mother    COPD Father    Arthritis Father    Heart disease Father    Heart attack Father    Alcohol abuse Father    Heart disease Brother    Heart attack Brother    Drug abuse Brother    Anxiety disorder Brother    Depression Brother    Heart disease Brother    Cancer Brother        liver cancer age 75 y.o   Diabetes Maternal Grandmother    Diabetes Maternal Grandfather    Diabetes Paternal Grandmother    Diabetes Paternal Grandfather    Breast cancer Neg Hx      Past Surgical History:  Procedure Laterality Date   ABLATION  2006   APPLICATION OF INTRAOPERATIVE CT SCAN N/A 01/28/2023   Procedure: APPLICATION OF INTRAOPERATIVE CT SCAN;  Surgeon: Loreen Freud, MD;  Location: [facility] ORS;  Service: Neurosurgery;  Laterality: N/A;   BREAST CYST ASPIRATION Left 1998   BREAST SURGERY     cyst aspiration   BREAST SURGERY  1998   cyst aspiration   COLONOSCOPY  03/2014   COLONOSCOPY WITH PROPOFOL N/A 01/06/2020   Procedure: COLONOSCOPY  WITH PROPOFOL;  Surgeon: Toledo, Boykin Nearing, MD;  Location: [facility] ENDOSCOPY;  Service: Gastroenterology;  Laterality: N/A;   ENDOMETRIAL ABLATION     ESOPHAGOGASTRODUODENOSCOPY (EGD) WITH PROPOFOL N/A 08/15/2015   Procedure: ESOPHAGOGASTRODUODENOSCOPY (EGD) WITH  PROPOFOL;  Surgeon: Christena Deem, MD;  Location: [facility] ENDOSCOPY;  Service: Endoscopy;  Laterality: N/A;   ESOPHAGOGASTRODUODENOSCOPY (EGD) WITH PROPOFOL N/A 01/22/2016   Procedure: ESOPHAGOGASTRODUODENOSCOPY (EGD) WITH PROPOFOL;  Surgeon: Christena Deem, MD;  Location: [facility] ENDOSCOPY;  Service: Endoscopy;  Laterality: N/A;   ESOPHAGOGASTRODUODENOSCOPY (EGD) WITH PROPOFOL N/A 01/06/2020   Procedure: ESOPHAGOGASTRODUODENOSCOPY (EGD) WITH PROPOFOL;  Surgeon: Toledo, Boykin Nearing, MD;  Location: [facility] ENDOSCOPY;  Service: Gastroenterology;  Laterality: N/A;   FLEXIBLE BRONCHOSCOPY Bilateral 03/13/2023   Procedure: FLEXIBLE BRONCHOSCOPY;  Surgeon: Erin Fulling, MD;  Location: [facility] ORS;  Service: Cardiopulmonary;  Laterality: Bilateral;   GASTRIC BYPASS  2005   HERNIA REPAIR     IR BONE MARROW BIOPSY & ASPIRATION  02/05/2023   NASAL SINUS SURGERY     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   TOTAL THYROIDECTOMY     TUBAL LIGATION      Social History   Socioeconomic History   Marital status: Divorced    Spouse name: Not on file   Number of children: 2   Years of education: Not on file   Highest education level: GED or equivalent  Occupational History   Occupation: Part-time  Tobacco Use   Smoking status: Former    Types: Cigarettes   Smokeless tobacco: Never   Tobacco comments:    Quit smoking 02/16/2023  Vaping Use   Vaping status: Never Used  Substance and Sexual Activity   Alcohol use: Yes    Alcohol/week: 10.0 - 12.0 standard drinks of alcohol    Types: 8 - 10 Glasses of wine, 2 Standard drinks or equivalent per week   Drug use: No   Sexual activity: Yes    Partners: Male  Other Topics Concern   Not on file  Social History Narrative   Lives in [place] single, divorced       Work - 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       Diet - healthy diet   Exercise - limited   Caffeine use: daily   Social Determinants of Health   Financial Resource Strain: Low Risk   (11/01/2022)   Overall Financial Resource Strain (CARDIA)    Difficulty of Paying Living Expenses: Not very hard  Food Insecurity: No Food Insecurity (03/18/2023)   Hunger Vital Sign    Worried About Running Out of Food in the Last Year: Never true    Ran Out of Food in the Last Year: Never true  Transportation Needs: No Transportation Needs (03/18/2023)   PRAPARE - Administrator, Civil Service (Medical): No    Lack of Transportation (Non-Medical): No  Physical Activity: Insufficiently Active (11/01/2022)   Exercise Vital Sign    Days of Exercise per Week: 3 days    Minutes of Exercise per Session: 20 min  Stress: Stress Concern Present (03/04/2023)   [employer] of Occupational Health - Occupational Stress Questionnaire    Feeling of Stress : Very much  Social Connections: Socially Isolated (03/04/2023)   Social Connection and Isolation Panel [NHANES]    Frequency of Communication with Friends and Family: Never    Frequency of Social Gatherings with Friends and Family: Never    Attends Religious Services: Never    Active Member of  Clubs or Organizations: No    Attends Banker Meetings: Never    Marital Status: Divorced  Catering manager Violence: Not At Risk (03/06/2023)   Humiliation, Afraid, Rape, and Kick questionnaire    Fear of Current or Ex-Partner: No    Emotionally Abused: No    Physically Abused: No    Sexually Abused: No     Allergies  Allergen Reactions   Hctz [Hydrochlorothiazide]     Hyponatremia      CBC    Component Value Date/Time   WBC 6.0 03/31/2023 0830   RBC 2.47 (L) 03/31/2023 0830   HGB 7.2 (L) 03/31/2023 0830   HGB 7.4 (L) 02/10/2023 2009   HCT 22.3 (L) 03/31/2023 0830   HCT 38.5 07/02/2011 1529   PLT 355 03/31/2023 0830   PLT 311 07/02/2011 1529   MCV 90.3 03/31/2023 0830   MCV 90 07/02/2011 1529   MCH 29.1 03/31/2023 0830   MCHC 32.3 03/31/2023 0830   RDW 16.3 (H) 03/31/2023 0830   RDW 13.4 07/02/2011 1529    LYMPHSABS 0.8 03/31/2023 0830   MONOABS 0.9 03/31/2023 0830   EOSABS 0.2 03/31/2023 0830   BASOSABS 0.1 03/31/2023 0830    Pulmonary Functions Testing Results:     No data to display          Outpatient Medications Prior to Visit  Medication Sig Dispense Refill   acyclovir (ZOVIRAX) 400 MG tablet Take 1 tablet (400 mg total) by mouth 2 (two) times daily. 60 tablet 5   amLODipine (NORVASC) 10 MG tablet Take 1 tablet (10 mg total) by mouth daily. 90 tablet 3   aspirin EC 81 MG tablet Take 1 tablet (81 mg total) by mouth daily. Swallow whole. 30 tablet 2   aspirin EC 81 MG tablet Take 1 tablet (81 mg total) by mouth daily. 100 tablet 3   butalbital-acetaminophen-caffeine (FIORICET) 50-325-40 MG tablet Take 1 tablet by mouth every 4 (four) hours as needed for headache. 14 tablet 0   clonazePAM (KLONOPIN) 1 MG tablet TAKE 1 TABLET(1 MG) BY MOUTH TWICE DAILY 60 tablet 2   cyanocobalamin (VITAMIN B12) 1000 MCG/ML injection INJECT 1 ML IN THE MUSCLE EVERY 30 DAYS 4 mL 12   cyclobenzaprine (FLEXERIL) 10 MG tablet Take 0.5-1 tablets (5-10 mg total) by mouth 3 (three) times daily as needed for muscle spasms. 60 tablet 1   dexamethasone (DECADRON) 4 MG tablet Take 5 tabs (20 mg) weekly the day after daratumumab for 12 weeks. Take with breakfast. 20 tablet 5   docusate sodium (COLACE) 100 MG capsule Take 1 capsule (100 mg total) by mouth 2 (two) times daily. 10 capsule 0   escitalopram (LEXAPRO) 10 MG tablet Take 1 tablet (10 mg total) by mouth daily. 30 tablet 2   fentaNYL (DURAGESIC) 50 MCG/HR Place 1 patch onto the skin every 3 (three) days. 5 patch 0   gabapentin (NEURONTIN) 100 MG capsule Take 1 capsule (100 mg total) by mouth 3 (three) times daily. 30 capsule 0   HYDROmorphone (DILAUDID) 2 MG tablet Take 1-2 tablets (2-4 mg total) by mouth every 6 (six) hours as needed for severe pain. 60 tablet 0   ipratropium-albuterol (DUONEB) 0.5-2.5 (3) MG/3ML SOLN Take 3 mLs by nebulization 4 (four)  times daily. 360 mL 0   lenalidomide (REVLIMID) 10 MG capsule Take 1 capsule (10 mg total) by mouth daily. Take for 14 days, then hold for 7 days. Repeat every 21 days. 14 capsule 0   levothyroxine (  SYNTHROID) 125 MCG tablet Take 2 tablets (250 mcg total) by mouth daily before breakfast. 60 tablet 0   linezolid (ZYVOX) 600 MG tablet Take 1 tablet (600 mg total) by mouth every 12 (twelve) hours. 10 tablet 0   ondansetron (ZOFRAN) 8 MG tablet Take 1 tablet (8 mg total) by mouth every 8 (eight) hours as needed for nausea or vomiting. 30 tablet 1   pantoprazole (PROTONIX) 40 MG tablet 30 min before lunch or dinner 90 tablet 3   polyethylene glycol (MIRALAX / GLYCOLAX) 17 g packet Take 17 g by mouth 2 (two) times daily. 14 each 0   prochlorperazine (COMPAZINE) 10 MG tablet Take 1 tablet (10 mg total) by mouth every 6 (six) hours as needed for nausea or vomiting. 30 tablet 1   senna-docusate (SENOKOT-S) 8.6-50 MG tablet Take 1 tablet by mouth 2 (two) times daily. 30 tablet 0   Spacer/Aero-Holding Chambers (AEROCHAMBER MV) inhaler Use as instructed 1 each 0   SYRINGE-NEEDLE, DISP, 3 ML 25G X 1-1/2" 3 ML MISC 1 Device by Does not apply route every 30 (thirty) days. With B12 shot 12 each 1   thiamine (VITAMIN B-1) 100 MG tablet Take 1 tablet (100 mg total) by mouth daily. 30 tablet 0   Vitamin D, Ergocalciferol, (DRISDOL) 1.25 MG (50000 UNIT) CAPS capsule Take 50,000 Units by mouth every 7 (seven) days.     zolpidem (AMBIEN) 10 MG tablet TAKE 1/2 TO 1 TABLET(5 TO 10 MG) BY MOUTH AT BEDTIME AS NEEDED FOR SLEEP 30 tablet 2   albuterol (VENTOLIN HFA) 108 (90 Base) MCG/ACT inhaler Inhale 2 puffs into the lungs every 6 (six) hours as needed for wheezing or shortness of breath. (Patient not taking: Reported on 04/01/2023) 18 g 11   mometasone-formoterol (DULERA) 200-5 MCG/ACT AERO Inhale 2 puffs into the lungs 2 (two) times daily. (Patient not taking: Reported on 04/01/2023) 1 each 2   umeclidinium bromide (INCRUSE  ELLIPTA) 62.5 MCG/ACT AEPB Inhale 1 puff into the lungs daily. (Patient not taking: Reported on 04/01/2023) 30 each 0   allopurinol (ZYLOPRIM) 100 MG tablet Take 1 tablet (100 mg total) by mouth daily. (Patient not taking: Reported on 04/01/2023) 30 tablet 0   No facility-administered medications prior to visit.

## 2023-04-02 ENCOUNTER — Other Ambulatory Visit: Payer: Managed Care, Other (non HMO)

## 2023-04-02 ENCOUNTER — Encounter: Payer: Self-pay | Admitting: Student in an Organized Health Care Education/Training Program

## 2023-04-02 ENCOUNTER — Telehealth: Payer: Self-pay | Admitting: *Deleted

## 2023-04-02 ENCOUNTER — Ambulatory Visit: Payer: Managed Care, Other (non HMO) | Admitting: Oncology

## 2023-04-02 ENCOUNTER — Encounter: Payer: Self-pay | Admitting: Oncology

## 2023-04-02 NOTE — Telephone Encounter (Signed)
Called [facility] to check on status of Lenalidomide prescription. Informed by representative it was ready to schedule. I was able to conference patient in to call to set up first fill shipment and delivery. Lenalidomide scheduled to be shipped today, 04/02/23, for delivery tomorrow, 04/03/23, to patient's address. Patient also knows to call me at [phone] with any questions or concerns regarding receiving medication from [facility].    Ardeen Fillers, CPhT Oncology Pharmacy Patient Advocate  [facility]  [phone] (phone) [phone] (fax) 04/02/2023 11:11 AM

## 2023-04-02 NOTE — Telephone Encounter (Signed)
She should come on Thursday as planned and get the delivery on friday

## 2023-04-02 NOTE — Telephone Encounter (Signed)
Patient has appointment tomorrow for infusion, but also needs to be home to sign for and receive er Revlimid and is asking if she needs to postpone her treatment tomorrow or not. Please advise. She said if she is not therem,they will not leave her medicine

## 2023-04-02 NOTE — Telephone Encounter (Signed)
What can be done about this? Can they deliver day after? I dont want to cancel or move rx if possible

## 2023-04-02 NOTE — Telephone Encounter (Signed)
Call returned to patient and informed of doctor response and that she can also call pharmacy to change delivery to Friday, She stated "OK"

## 2023-04-02 NOTE — Telephone Encounter (Signed)
Dr. Dgayli, please advise. Thanks   

## 2023-04-03 ENCOUNTER — Inpatient Hospital Stay: Payer: Managed Care, Other (non HMO)

## 2023-04-03 ENCOUNTER — Encounter: Payer: Self-pay | Admitting: Oncology

## 2023-04-03 VITALS — BP 125/70 | HR 100 | Temp 97.6°F

## 2023-04-03 DIAGNOSIS — C9000 Multiple myeloma not having achieved remission: Secondary | ICD-10-CM

## 2023-04-03 DIAGNOSIS — Z5112 Encounter for antineoplastic immunotherapy: Secondary | ICD-10-CM | POA: Diagnosis not present

## 2023-04-03 LAB — PRETREATMENT RBC PHENOTYPE

## 2023-04-03 MED ORDER — BORTEZOMIB CHEMO SQ INJECTION 3.5 MG (2.5MG/ML)
1.3000 mg/m2 | Freq: Once | INTRAMUSCULAR | Status: AC
  Administered 2023-04-03: 2.25 mg via SUBCUTANEOUS
  Filled 2023-04-03: qty 0.9

## 2023-04-03 MED ORDER — PROCHLORPERAZINE MALEATE 10 MG PO TABS
10.0000 mg | ORAL_TABLET | Freq: Once | ORAL | Status: AC
  Administered 2023-04-03: 10 mg via ORAL
  Filled 2023-04-03: qty 1

## 2023-04-03 NOTE — Patient Instructions (Signed)
[place] [facility]  Discharge Instructions: Thank you for choosing [facility] to provide your oncology and hematology care.  If you have a lab appointment with the Cancer Center, please go directly to the Cancer Center and check in at the registration area.  Wear comfortable clothing and clothing appropriate for easy access to any Portacath or PICC line.   We strive to give you quality time with your provider. You may need to reschedule your appointment if you arrive late (15 or more minutes).  Arriving late affects you and other patients whose appointments are after yours.  Also, if you miss three or more appointments without notifying the office, you may be dismissed from the clinic at the provider's discretion.      For prescription refill requests, have your pharmacy contact our office and allow 72 hours for refills to be completed.    Today you received the following chemotherapy and/or immunotherapy agents Velcade.      To help prevent nausea and vomiting after your treatment, we encourage you to take your nausea medication as directed.  BELOW ARE SYMPTOMS THAT SHOULD BE REPORTED IMMEDIATELY: *FEVER GREATER THAN 100.4 F (38 C) OR HIGHER *CHILLS OR SWEATING *NAUSEA AND VOMITING THAT IS NOT CONTROLLED WITH YOUR NAUSEA MEDICATION *UNUSUAL SHORTNESS OF BREATH *UNUSUAL BRUISING OR BLEEDING *URINARY PROBLEMS (pain or burning when urinating, or frequent urination) *BOWEL PROBLEMS (unusual diarrhea, constipation, pain near the anus) TENDERNESS IN MOUTH AND THROAT WITH OR WITHOUT PRESENCE OF ULCERS (sore throat, sores in mouth, or a toothache) UNUSUAL RASH, SWELLING OR PAIN  UNUSUAL VAGINAL DISCHARGE OR ITCHING   Items with * indicate a potential emergency and should be followed up as soon as possible or go to the Emergency Department if any problems should occur.  Please show the CHEMOTHERAPY ALERT CARD or IMMUNOTHERAPY ALERT CARD at check-in to  the Emergency Department and triage nurse.  Should you have questions after your visit or need to cancel or reschedule your appointment, please contact [place] [facility]  [phone] and follow the prompts.  Office hours are 8:00 a.m. to 4:30 p.m. Monday - Friday. Please note that voicemails left after 4:00 p.m. may not be returned until the following business day.  We are closed weekends and major holidays. You have access to a nurse at all times for urgent questions. Please call the main number to the clinic [phone] and follow the prompts.  For any non-urgent questions, you may also contact your provider using MyChart. We now offer e-Visits for anyone 31 and older to request care online for non-urgent symptoms. For details visit [email or web address].   Also download the MyChart app! Go to the app store, search "MyChart", open the app, select [place], and log in with your MyChart username and password.

## 2023-04-03 NOTE — Telephone Encounter (Signed)
Met patient in clinic today and provided them with updated Revlimid dosing calendar. Patient was very thankful and knows to call me at [phone] with any questions or concerns regarding receiving medication from [facility].    Ardeen Fillers, CPhT Oncology Pharmacy Patient Advocate  [facility]  [phone] (phone) [phone] (fax) 04/03/2023 2:39 PM

## 2023-04-07 ENCOUNTER — Other Ambulatory Visit: Payer: Self-pay | Admitting: *Deleted

## 2023-04-07 ENCOUNTER — Inpatient Hospital Stay: Payer: Managed Care, Other (non HMO)

## 2023-04-07 ENCOUNTER — Other Ambulatory Visit: Payer: Self-pay

## 2023-04-07 ENCOUNTER — Encounter: Payer: Self-pay | Admitting: Oncology

## 2023-04-07 VITALS — BP 115/64 | HR 74 | Temp 98.7°F | Resp 18 | Wt 162.8 lb

## 2023-04-07 DIAGNOSIS — Z5112 Encounter for antineoplastic immunotherapy: Secondary | ICD-10-CM | POA: Diagnosis not present

## 2023-04-07 DIAGNOSIS — C9000 Multiple myeloma not having achieved remission: Secondary | ICD-10-CM

## 2023-04-07 DIAGNOSIS — Z515 Encounter for palliative care: Secondary | ICD-10-CM

## 2023-04-07 LAB — CMP (CANCER CENTER ONLY)
ALT: 11 U/L (ref 0–44)
AST: 11 U/L — ABNORMAL LOW (ref 15–41)
Albumin: 2.8 g/dL — ABNORMAL LOW (ref 3.5–5.0)
Alkaline Phosphatase: 65 U/L (ref 38–126)
Anion gap: 9 (ref 5–15)
BUN: 22 mg/dL — ABNORMAL HIGH (ref 6–20)
CO2: 25 mmol/L (ref 22–32)
Calcium: 8.9 mg/dL (ref 8.9–10.3)
Chloride: 101 mmol/L (ref 98–111)
Creatinine: 1.13 mg/dL — ABNORMAL HIGH (ref 0.44–1.00)
GFR, Estimated: 56 mL/min — ABNORMAL LOW (ref >60)
Glucose, Bld: 105 mg/dL — ABNORMAL HIGH (ref 70–99)
Potassium: 3.2 mmol/L — ABNORMAL LOW (ref 3.5–5.1)
Sodium: 135 mmol/L (ref 135–145)
Total Bilirubin: 0.3 mg/dL (ref 0.3–1.2)
Total Protein: 7.2 g/dL (ref 6.5–8.1)

## 2023-04-07 LAB — CBC WITH DIFFERENTIAL (CANCER CENTER ONLY)
Abs Immature Granulocytes: 0.01 10*3/uL (ref 0.00–0.07)
Basophils Absolute: 0 10*3/uL (ref 0.0–0.1)
Basophils Relative: 1 %
Eosinophils Absolute: 0.3 10*3/uL (ref 0.0–0.5)
Eosinophils Relative: 6 %
HCT: 24.8 % — ABNORMAL LOW (ref 36.0–46.0)
Hemoglobin: 8 g/dL — ABNORMAL LOW (ref 12.0–15.0)
Immature Granulocytes: 0 %
Lymphocytes Relative: 11 %
Lymphs Abs: 0.5 10*3/uL — ABNORMAL LOW (ref 0.7–4.0)
MCH: 29.3 pg (ref 26.0–34.0)
MCHC: 32.3 g/dL (ref 30.0–36.0)
MCV: 90.8 fL (ref 80.0–100.0)
Monocytes Absolute: 0.5 10*3/uL (ref 0.1–1.0)
Monocytes Relative: 12 %
Neutro Abs: 2.8 10*3/uL (ref 1.7–7.7)
Neutrophils Relative %: 70 %
Platelet Count: 279 10*3/uL (ref 150–400)
RBC: 2.73 MIL/uL — ABNORMAL LOW (ref 3.87–5.11)
RDW: 17.5 % — ABNORMAL HIGH (ref 11.5–15.5)
WBC Count: 4.1 10*3/uL (ref 4.0–10.5)
nRBC: 0 % (ref 0.0–0.2)

## 2023-04-07 LAB — SAMPLE TO BLOOD BANK

## 2023-04-07 LAB — IRON AND TIBC
Iron: 40 ug/dL (ref 28–170)
Saturation Ratios: 16 % (ref 10.4–31.8)
TIBC: 251 ug/dL (ref 250–450)
UIBC: 211 ug/dL

## 2023-04-07 LAB — VITAMIN B12: Vitamin B-12: 911 pg/mL (ref 180–914)

## 2023-04-07 LAB — FERRITIN: Ferritin: 334 ng/mL — ABNORMAL HIGH (ref 11–307)

## 2023-04-07 MED ORDER — DEXAMETHASONE 4 MG PO TABS
20.0000 mg | ORAL_TABLET | Freq: Once | ORAL | Status: AC
  Administered 2023-04-07: 20 mg via ORAL
  Filled 2023-04-07: qty 5

## 2023-04-07 MED ORDER — ACETAMINOPHEN 325 MG PO TABS
650.0000 mg | ORAL_TABLET | Freq: Once | ORAL | Status: AC
  Administered 2023-04-07: 650 mg via ORAL
  Filled 2023-04-07: qty 2

## 2023-04-07 MED ORDER — FENTANYL 50 MCG/HR TD PT72: 1.0000 | MEDICATED_PATCH | TRANSDERMAL | 0 refills | Status: DC

## 2023-04-07 MED ORDER — BORTEZOMIB CHEMO SQ INJECTION 3.5 MG (2.5MG/ML)
1.3000 mg/m2 | Freq: Once | INTRAMUSCULAR | Status: AC
  Administered 2023-04-07: 2.25 mg via SUBCUTANEOUS
  Filled 2023-04-07: qty 0.9

## 2023-04-07 MED ORDER — HYDROMORPHONE HCL 2 MG PO TABS: 2.0000 mg | ORAL_TABLET | Freq: Four times a day (QID) | ORAL | 0 refills | Status: DC | PRN

## 2023-04-07 MED ORDER — DARATUMUMAB-HYALURONIDASE-FIHJ 1800-30000 MG-UT/15ML ~~LOC~~ SOLN
1800.0000 mg | Freq: Once | SUBCUTANEOUS | Status: AC
  Administered 2023-04-07: 1800 mg via SUBCUTANEOUS
  Filled 2023-04-07: qty 15

## 2023-04-07 MED ORDER — DIPHENHYDRAMINE HCL 25 MG PO CAPS
50.0000 mg | ORAL_CAPSULE | Freq: Once | ORAL | Status: AC
  Administered 2023-04-07: 50 mg via ORAL
  Filled 2023-04-07: qty 2

## 2023-04-07 MED ORDER — MONTELUKAST SODIUM 10 MG PO TABS
10.0000 mg | ORAL_TABLET | Freq: Once | ORAL | Status: AC
  Administered 2023-04-07: 10 mg via ORAL
  Filled 2023-04-07: qty 1

## 2023-04-08 LAB — KAPPA/LAMBDA LIGHT CHAINS
Kappa free light chain: 18.1 mg/L (ref 3.3–19.4)
Kappa, lambda light chain ratio: 0.05 — ABNORMAL LOW (ref 0.26–1.65)
Lambda free light chains: 371.9 mg/L — ABNORMAL HIGH (ref 5.7–26.3)

## 2023-04-10 ENCOUNTER — Inpatient Hospital Stay: Payer: Managed Care, Other (non HMO)

## 2023-04-10 VITALS — BP 138/84 | HR 91 | Temp 98.0°F

## 2023-04-10 DIAGNOSIS — C9000 Multiple myeloma not having achieved remission: Secondary | ICD-10-CM

## 2023-04-10 DIAGNOSIS — D509 Iron deficiency anemia, unspecified: Secondary | ICD-10-CM

## 2023-04-10 DIAGNOSIS — Z5112 Encounter for antineoplastic immunotherapy: Secondary | ICD-10-CM | POA: Diagnosis not present

## 2023-04-10 LAB — MULTIPLE MYELOMA PANEL, SERUM
Albumin SerPl Elph-Mcnc: 3 g/dL (ref 2.9–4.4)
Albumin/Glob SerPl: 0.8 (ref 0.7–1.7)
Alpha 1: 0.3 g/dL (ref 0.0–0.4)
Alpha2 Glob SerPl Elph-Mcnc: 0.7 g/dL (ref 0.4–1.0)
B-Globulin SerPl Elph-Mcnc: 2.5 g/dL — ABNORMAL HIGH (ref 0.7–1.3)
Gamma Glob SerPl Elph-Mcnc: 0.5 g/dL (ref 0.4–1.8)
Globulin, Total: 4 g/dL — ABNORMAL HIGH (ref 2.2–3.9)
IgA: 46 mg/dL — ABNORMAL LOW (ref 87–352)
IgG (Immunoglobin G), Serum: 2567 mg/dL — ABNORMAL HIGH (ref 586–1602)
IgM (Immunoglobulin M), Srm: 57 mg/dL (ref 26–217)
M Protein SerPl Elph-Mcnc: 1.9 g/dL — ABNORMAL HIGH
Total Protein ELP: 7 g/dL (ref 6.0–8.5)

## 2023-04-10 MED ORDER — BORTEZOMIB CHEMO SQ INJECTION 3.5 MG (2.5MG/ML)
1.3000 mg/m2 | Freq: Once | INTRAMUSCULAR | Status: AC
  Administered 2023-04-10: 2.25 mg via SUBCUTANEOUS
  Filled 2023-04-10: qty 0.9

## 2023-04-10 MED ORDER — INFLUENZA VIRUS VACC SPLIT PF (FLUZONE) 0.5 ML IM SUSY
0.5000 mL | PREFILLED_SYRINGE | Freq: Once | INTRAMUSCULAR | Status: AC
Start: 2023-04-10 — End: 2023-04-10
  Administered 2023-04-10: 0.5 mL via INTRAMUSCULAR
  Filled 2023-04-10: qty 0.5

## 2023-04-10 MED ORDER — PROCHLORPERAZINE MALEATE 10 MG PO TABS
10.0000 mg | ORAL_TABLET | Freq: Once | ORAL | Status: AC
  Administered 2023-04-10: 10 mg via ORAL
  Filled 2023-04-10: qty 1

## 2023-04-10 MED ORDER — CYANOCOBALAMIN 1000 MCG/ML IJ SOLN
1000.0000 ug | Freq: Once | INTRAMUSCULAR | Status: AC
  Administered 2023-04-10: 1000 ug via INTRAMUSCULAR
  Filled 2023-04-10: qty 1

## 2023-04-10 NOTE — Patient Instructions (Signed)
[place] [facility] [place] [facility]  Discharge Instructions: Thank you for choosing [facility] to provide your oncology and hematology care.  If you have a lab appointment with the Cancer Center, please go directly to the Cancer Center and check in at the registration area.  Wear comfortable clothing and clothing appropriate for easy access to any Portacath or PICC line.   We strive to give you quality time with your provider. You may need to reschedule your appointment if you arrive late (15 or more minutes).  Arriving late affects you and other patients whose appointments are after yours.  Also, if you miss three or more appointments without notifying the office, you may be dismissed from the clinic at the provider's discretion.      For prescription refill requests, have your pharmacy contact our office and allow 72 hours for refills to be completed.    Today you received the following chemotherapy and/or immunotherapy agents Velcade.      To help prevent nausea and vomiting after your treatment, we encourage you to take your nausea medication as directed.  BELOW ARE SYMPTOMS THAT SHOULD BE REPORTED IMMEDIATELY: *FEVER GREATER THAN 100.4 F (38 C) OR HIGHER *CHILLS OR SWEATING *NAUSEA AND VOMITING THAT IS NOT CONTROLLED WITH YOUR NAUSEA MEDICATION *UNUSUAL SHORTNESS OF BREATH *UNUSUAL BRUISING OR BLEEDING *URINARY PROBLEMS (pain or burning when urinating, or frequent urination) *BOWEL PROBLEMS (unusual diarrhea, constipation, pain near the anus) TENDERNESS IN MOUTH AND THROAT WITH OR WITHOUT PRESENCE OF ULCERS (sore throat, sores in mouth, or a toothache) UNUSUAL RASH, SWELLING OR PAIN  UNUSUAL VAGINAL DISCHARGE OR ITCHING   Items with * indicate a potential emergency and should be followed up as soon as possible or go to the Emergency Department if any problems should occur.  Please show the CHEMOTHERAPY ALERT CARD or IMMUNOTHERAPY ALERT CARD at check-in to  the Emergency Department and triage nurse.  Should you have questions after your visit or need to cancel or reschedule your appointment, please contact  [facility] [place] [facility]  [phone] and follow the prompts.  Office hours are 8:00 a.m. to 4:30 p.m. Monday - Friday. Please note that voicemails left after 4:00 p.m. may not be returned until the following business day.  We are closed weekends and major holidays. You have access to a nurse at all times for urgent questions. Please call the main number to the clinic [phone] and follow the prompts.  For any non-urgent questions, you may also contact your provider using MyChart. We now offer e-Visits for anyone 18 and older to request care online for non-urgent symptoms. For details visit [email or web address].   Also download the MyChart app! Go to the app store, search "MyChart", open the app, select , and log in with your MyChart username and password.    

## 2023-04-11 LAB — ACID FAST CULTURE WITH REFLEXED SENSITIVITIES (MYCOBACTERIA): Acid Fast Culture: NEGATIVE

## 2023-04-14 ENCOUNTER — Encounter: Payer: Self-pay | Admitting: Oncology

## 2023-04-14 ENCOUNTER — Inpatient Hospital Stay (HOSPITAL_BASED_OUTPATIENT_CLINIC_OR_DEPARTMENT_OTHER): Payer: Managed Care, Other (non HMO) | Admitting: Oncology

## 2023-04-14 ENCOUNTER — Inpatient Hospital Stay: Payer: Managed Care, Other (non HMO)

## 2023-04-14 ENCOUNTER — Other Ambulatory Visit: Payer: Self-pay | Admitting: Oncology

## 2023-04-14 VITALS — BP 128/77 | HR 72 | Resp 18

## 2023-04-14 VITALS — BP 138/79 | HR 55 | Temp 98.0°F | Resp 18 | Wt 167.3 lb

## 2023-04-14 DIAGNOSIS — G893 Neoplasm related pain (acute) (chronic): Secondary | ICD-10-CM | POA: Diagnosis not present

## 2023-04-14 DIAGNOSIS — C9000 Multiple myeloma not having achieved remission: Secondary | ICD-10-CM

## 2023-04-14 DIAGNOSIS — Z5111 Encounter for antineoplastic chemotherapy: Secondary | ICD-10-CM | POA: Diagnosis not present

## 2023-04-14 DIAGNOSIS — Z5112 Encounter for antineoplastic immunotherapy: Secondary | ICD-10-CM | POA: Diagnosis not present

## 2023-04-14 DIAGNOSIS — Z79899 Other long term (current) drug therapy: Secondary | ICD-10-CM

## 2023-04-14 LAB — CBC WITH DIFFERENTIAL (CANCER CENTER ONLY)
Abs Immature Granulocytes: 0.02 10*3/uL (ref 0.00–0.07)
Basophils Absolute: 0 10*3/uL (ref 0.0–0.1)
Basophils Relative: 0 %
Eosinophils Absolute: 0.2 10*3/uL (ref 0.0–0.5)
Eosinophils Relative: 4 %
HCT: 26.9 % — ABNORMAL LOW (ref 36.0–46.0)
Hemoglobin: 8.6 g/dL — ABNORMAL LOW (ref 12.0–15.0)
Immature Granulocytes: 0 %
Lymphocytes Relative: 9 %
Lymphs Abs: 0.4 10*3/uL — ABNORMAL LOW (ref 0.7–4.0)
MCH: 29.9 pg (ref 26.0–34.0)
MCHC: 32 g/dL (ref 30.0–36.0)
MCV: 93.4 fL (ref 80.0–100.0)
Monocytes Absolute: 0.8 10*3/uL (ref 0.1–1.0)
Monocytes Relative: 15 %
Neutro Abs: 3.7 10*3/uL (ref 1.7–7.7)
Neutrophils Relative %: 72 %
Platelet Count: 201 10*3/uL (ref 150–400)
RBC: 2.88 MIL/uL — ABNORMAL LOW (ref 3.87–5.11)
RDW: 16.7 % — ABNORMAL HIGH (ref 11.5–15.5)
WBC Count: 5.2 10*3/uL (ref 4.0–10.5)
nRBC: 0 % (ref 0.0–0.2)

## 2023-04-14 MED ORDER — MONTELUKAST SODIUM 10 MG PO TABS
10.0000 mg | ORAL_TABLET | Freq: Once | ORAL | Status: AC
  Administered 2023-04-14: 10 mg via ORAL
  Filled 2023-04-14: qty 1

## 2023-04-14 MED ORDER — DEXAMETHASONE 4 MG PO TABS
20.0000 mg | ORAL_TABLET | Freq: Once | ORAL | Status: AC
  Administered 2023-04-14: 20 mg via ORAL
  Filled 2023-04-14: qty 5

## 2023-04-14 MED ORDER — DARATUMUMAB-HYALURONIDASE-FIHJ 1800-30000 MG-UT/15ML ~~LOC~~ SOLN
1800.0000 mg | Freq: Once | SUBCUTANEOUS | Status: AC
  Administered 2023-04-14: 1800 mg via SUBCUTANEOUS
  Filled 2023-04-14: qty 15

## 2023-04-14 MED ORDER — DIPHENHYDRAMINE HCL 25 MG PO CAPS
50.0000 mg | ORAL_CAPSULE | Freq: Once | ORAL | Status: AC
  Administered 2023-04-14: 50 mg via ORAL
  Filled 2023-04-14: qty 2

## 2023-04-14 MED ORDER — ACETAMINOPHEN 325 MG PO TABS
650.0000 mg | ORAL_TABLET | Freq: Once | ORAL | Status: AC
  Administered 2023-04-14: 650 mg via ORAL
  Filled 2023-04-14: qty 2

## 2023-04-14 NOTE — Progress Notes (Signed)
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 She was last seen by me in April 2024 and at that time she had complained of mild left chest wall pain radiating to her back.  She underwent cardiac workup and I had done a bone survey at that time which did not show any evidence of bone disease.  Her smoldering multiple myeloma at that time was stable.  Subsequently she has had chest x-ray in May 2024 as well as a rib x-ray in July 2024.  None of these showed any abnormalities.  Her pain has gradually worsened in the last 2 months but especially so in the last 1 week.  She presented to the ER with symptoms of pleuritic chest wall pain and shortness of breath and underwent a CT angio chest which showed a soft tissue mass with bone destruction involving the left seventh rib as well as T6 and T7 vertebral bodies and central canal.  Possibility of cord compression.  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   Results of myeloma work-up from 06/20/2020 showed an  elevated IgG of 3668.  M protein was elevated at 3.1 and IFE showed IgG monoclonal lambda protein.  Beta-2 microglobulin and LDH were normal.  Serum free light chain ratio was elevated at 25 with a free light chain lambda of 303.  Bone marrow biopsy showed increased plasma cells 22% by manual count and 20% by IHC staining for CD138.  Cytogenetics for myeloma showed gain of 1 q.  PET CT scan was not approved by insurance.  MRI cervical and lumbar thoracic spine did not show any evidence of lytic lesions.  Bone survey was negative for bone lesions as well.  MRI pelvis with and without contrast showed diffuse patchy heterogeneous marrow concerning for myeloma    Patient had a repeat bone marrow biopsy inAugust 2024 which showed 20% overall plasma cells and hypercellular marrow consistent with plasma cell myeloma.  Cytogenetics normal and FISH studies did not show any abnormalities.  Patient had stabilization procedure/spinal fusion T4-T9 with T6-T7 laminotomies.  Spinal mass biopsy was consistent with plasma cell neoplasm. PET CT scan showed solitary hypermetabolic lytic lesion in the L4 vertebral body consistent with multiple myeloma.  Residual soft tissue density at the left paraspinal resection site T6-T7 with no significant metabolic activity.   Plan was to do outpatient daratumumab Revlimid Velcade dexamethasone regimen.  However patient was found to be in acute kidney failure with a creatinine of 5 in August 2024 and was sent to the ER.  She received cycle 1 of CyBorD in the hospital  with improvement in her creatinine with stabilized between 1.5-1.7.  Treatment has been complicated by repeated hospitalizations for acute hypoxic respiratory failure and left pleural effusion which has been treated with antibiotics and thoracentesis.    Subsequently patient's clinical condition improved and she started D VRD regimen as per Valentina Lucks protocol on 03/31/2023  Interval history-tolerating treatments well so far.   She does not feel short of breath anymore and is off oxygen.  Continues to have back pain and is presently taking Dilaudid 2 mg 4 times a day as needed along with fentanyl patch to 50 mcg.  She was previously on gabapentin but feels dizzy with that medication.  Bowel habits are regular  ECOG PS- 1 Pain scale- 0 Opioid associated constipation- no  Review of systems- Review of Systems  Constitutional:  Positive for malaise/fatigue. Negative for chills, fever and weight loss.  HENT:  Negative for congestion, ear discharge and nosebleeds.   Eyes:  Negative for blurred vision.  Respiratory:  Negative for cough, hemoptysis, sputum production, shortness of breath and wheezing.   Cardiovascular:  Negative for chest pain, palpitations, orthopnea and claudication.  Gastrointestinal:  Negative for abdominal pain, blood in stool, constipation, diarrhea, heartburn, melena, nausea and vomiting.  Genitourinary:  Negative for dysuria, flank pain, frequency, hematuria and urgency.  Musculoskeletal:  Positive for back pain. Negative for joint pain and myalgias.  Skin:  Negative for rash.  Neurological:  Negative for dizziness, tingling, focal weakness, seizures, weakness and headaches.  Endo/Heme/Allergies:  Does not bruise/bleed easily.  Psychiatric/Behavioral:  Negative for depression and suicidal ideas. The patient does not have insomnia.       Allergies  Allergen Reactions   Hctz [Hydrochlorothiazide]     Hyponatremia      Past Medical History:  Diagnosis Date   Anxiety    Arthritis    Asthma    Barrett's esophagus 2015   COVID-19    03/10/20   Depression    GERD (gastroesophageal reflux disease)    Hypertension    Hypothyroidism    Pneumonia    Smoldering myeloma    Thyroid disease      Past Surgical History:  Procedure Laterality Date   ABLATION  2006   APPLICATION OF INTRAOPERATIVE CT SCAN N/A 01/28/2023   Procedure: APPLICATION OF INTRAOPERATIVE CT SCAN;  Surgeon: Loreen Freud, MD;  Location: [facility] ORS;  Service: Neurosurgery;  Laterality: N/A;   BREAST CYST ASPIRATION Left 1998   BREAST SURGERY     cyst aspiration   BREAST SURGERY  1998   cyst aspiration   COLONOSCOPY  03/2014   COLONOSCOPY WITH PROPOFOL N/A 01/06/2020   Procedure: COLONOSCOPY WITH PROPOFOL;  Surgeon: Toledo, Boykin Nearing, MD;  Location: [facility] ENDOSCOPY;  Service: Gastroenterology;  Laterality: N/A;   ENDOMETRIAL ABLATION     ESOPHAGOGASTRODUODENOSCOPY (EGD) WITH PROPOFOL N/A 08/15/2015   Procedure: ESOPHAGOGASTRODUODENOSCOPY (EGD) WITH PROPOFOL;  Surgeon: Christena Deem, MD;  Location: [facility];  Service: Endoscopy;  Laterality: N/A;   ESOPHAGOGASTRODUODENOSCOPY (EGD) WITH PROPOFOL N/A 01/22/2016   Procedure: ESOPHAGOGASTRODUODENOSCOPY (EGD) WITH PROPOFOL;  Surgeon: Christena Deem, MD;  Location: [facility] ENDOSCOPY;  Service: Endoscopy;  Laterality: N/A;   ESOPHAGOGASTRODUODENOSCOPY (EGD) WITH PROPOFOL N/A 01/06/2020   Procedure: ESOPHAGOGASTRODUODENOSCOPY (EGD) WITH PROPOFOL;  Surgeon: Toledo, Boykin Nearing, MD;  Location: [facility] ENDOSCOPY;  Service: Gastroenterology;  Laterality: N/A;   FLEXIBLE BRONCHOSCOPY Bilateral 03/13/2023   Procedure: FLEXIBLE BRONCHOSCOPY;  Surgeon: Erin Fulling, MD;  Location: [facility] ORS;  Service: Cardiopulmonary;  Laterality: Bilateral;  GASTRIC BYPASS  2005   HERNIA REPAIR     IR BONE MARROW BIOPSY & ASPIRATION  02/05/2023   NASAL SINUS SURGERY     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   TOTAL THYROIDECTOMY     TUBAL LIGATION      Social History   Socioeconomic History   Marital status: Divorced    Spouse name: Not on file   Number of children: 2   Years of education: Not on file   Highest education level: GED or equivalent  Occupational History   Occupation: Part-time  Tobacco Use   Smoking status: Former    Types: Cigarettes   Smokeless tobacco: Never   Tobacco comments:    Quit smoking 02/16/2023  Vaping Use   Vaping status: Never Used   Substance and Sexual Activity   Alcohol use: Yes    Alcohol/week: 10.0 - 12.0 standard drinks of alcohol    Types: 8 - 10 Glasses of wine, 2 Standard drinks or equivalent per week   Drug use: No   Sexual activity: Yes    Partners: Male  Other Topics Concern   Not on file  Social History Narrative   Lives in [place] single, divorced       Work - 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       Diet - healthy diet   Exercise - limited   Caffeine use: daily   Social Determinants of Health   Financial Resource Strain: Low Risk  (11/01/2022)   Overall Financial Resource Strain (CARDIA)    Difficulty of Paying Living Expenses: Not very hard  Food Insecurity: No Food Insecurity (03/18/2023)   Hunger Vital Sign    Worried About Running Out of Food in the Last Year: Never true    Ran Out of Food in the Last Year: Never true  Transportation Needs: No Transportation Needs (03/18/2023)   PRAPARE - Administrator, Civil Service (Medical): No    Lack of Transportation (Non-Medical): No  Physical Activity: Insufficiently Active (11/01/2022)   Exercise Vital Sign    Days of Exercise per Week: 3 days    Minutes of Exercise per Session: 20 min  Stress: Stress Concern Present (03/04/2023)   [employer] of Occupational Health - Occupational Stress Questionnaire    Feeling of Stress : Very much  Social Connections: Socially Isolated (03/04/2023)   Social Connection and Isolation Panel [NHANES]    Frequency of Communication with Friends and Family: Never    Frequency of Social Gatherings with Friends and Family: Never    Attends Religious Services: Never    Database administrator or Organizations: No    Attends Banker Meetings: Never    Marital Status: Divorced  Catering manager Violence: Not At Risk (03/06/2023)   Humiliation, Afraid, Rape, and Kick questionnaire    Fear of Current or Ex-Partner: No    Emotionally Abused: No    Physically Abused: No    Sexually  Abused: No    Family History  Problem Relation Age of Onset   Heart disease Mother    COPD Mother    Arthritis Mother    Cancer Mother        uterine   Lupus Mother    Anxiety disorder Mother    Depression Mother    Drug abuse Mother    COPD Father    Arthritis Father    Heart disease Father    Heart attack Father  Alcohol abuse Father    Heart disease Brother    Heart attack Brother    Drug abuse Brother    Anxiety disorder Brother    Depression Brother    Heart disease Brother    Cancer Brother        liver cancer age 20 y.o   Diabetes Maternal Grandmother    Diabetes Maternal Grandfather    Diabetes Paternal Grandmother    Diabetes Paternal Grandfather    Breast cancer Neg Hx      Current Outpatient Medications:    acyclovir (ZOVIRAX) 400 MG tablet, Take 1 tablet (400 mg total) by mouth 2 (two) times daily., Disp: 60 tablet, Rfl: 5   albuterol (VENTOLIN HFA) 108 (90 Base) MCG/ACT inhaler, Inhale 2 puffs into the lungs every 6 (six) hours as needed for wheezing or shortness of breath. (Patient not taking: Reported on 04/01/2023), Disp: 18 g, Rfl: 11   amLODipine (NORVASC) 10 MG tablet, Take 1 tablet (10 mg total) by mouth daily., Disp: 90 tablet, Rfl: 3   aspirin EC 81 MG tablet, Take 1 tablet (81 mg total) by mouth daily. Swallow whole., Disp: 30 tablet, Rfl: 2   aspirin EC 81 MG tablet, Take 1 tablet (81 mg total) by mouth daily., Disp: 100 tablet, Rfl: 3   butalbital-acetaminophen-caffeine (FIORICET) 50-325-40 MG tablet, Take 1 tablet by mouth every 4 (four) hours as needed for headache., Disp: 14 tablet, Rfl: 0   clonazePAM (KLONOPIN) 1 MG tablet, TAKE 1 TABLET(1 MG) BY MOUTH TWICE DAILY, Disp: 60 tablet, Rfl: 2   cyanocobalamin (VITAMIN B12) 1000 MCG/ML injection, INJECT 1 ML IN THE MUSCLE EVERY 30 DAYS, Disp: 4 mL, Rfl: 12   cyclobenzaprine (FLEXERIL) 10 MG tablet, Take 0.5-1 tablets (5-10 mg total) by mouth 3 (three) times daily as needed for muscle spasms.,  Disp: 60 tablet, Rfl: 1   dexamethasone (DECADRON) 4 MG tablet, Take 5 tabs (20 mg) weekly the day after daratumumab for 12 weeks. Take with breakfast., Disp: 20 tablet, Rfl: 5   docusate sodium (COLACE) 100 MG capsule, Take 1 capsule (100 mg total) by mouth 2 (two) times daily., Disp: 10 capsule, Rfl: 0   escitalopram (LEXAPRO) 10 MG tablet, Take 1 tablet (10 mg total) by mouth daily., Disp: 30 tablet, Rfl: 2   fentaNYL (DURAGESIC) 50 MCG/HR, Place 1 patch onto the skin every 3 (three) days., Disp: 5 patch, Rfl: 0   gabapentin (NEURONTIN) 100 MG capsule, Take 1 capsule (100 mg total) by mouth 3 (three) times daily., Disp: 30 capsule, Rfl: 0   HYDROmorphone (DILAUDID) 2 MG tablet, Take 1-2 tablets (2-4 mg total) by mouth every 6 (six) hours as needed for severe pain (pain score 7-10)., Disp: 60 tablet, Rfl: 0   ipratropium-albuterol (DUONEB) 0.5-2.5 (3) MG/3ML SOLN, Take 3 mLs by nebulization 4 (four) times daily., Disp: 360 mL, Rfl: 0   lenalidomide (REVLIMID) 10 MG capsule, Take 1 capsule (10 mg total) by mouth daily. Take for 14 days, then hold for 7 days. Repeat every 21 days., Disp: 14 capsule, Rfl: 0   levothyroxine (SYNTHROID) 125 MCG tablet, Take 2 tablets (250 mcg total) by mouth daily before breakfast., Disp: 60 tablet, Rfl: 0   linezolid (ZYVOX) 600 MG tablet, Take 1 tablet (600 mg total) by mouth every 12 (twelve) hours., Disp: 10 tablet, Rfl: 0   mometasone-formoterol (DULERA) 200-5 MCG/ACT AERO, Inhale 2 puffs into the lungs 2 (two) times daily. (Patient not taking: Reported on 04/01/2023), Disp: 1 each, Rfl:  2   ondansetron (ZOFRAN) 8 MG tablet, Take 1 tablet (8 mg total) by mouth every 8 (eight) hours as needed for nausea or vomiting., Disp: 30 tablet, Rfl: 1   pantoprazole (PROTONIX) 40 MG tablet, 30 min before lunch or dinner, Disp: 90 tablet, Rfl: 3   polyethylene glycol (MIRALAX / GLYCOLAX) 17 g packet, Take 17 g by mouth 2 (two) times daily., Disp: 14 each, Rfl: 0    prochlorperazine (COMPAZINE) 10 MG tablet, Take 1 tablet (10 mg total) by mouth every 6 (six) hours as needed for nausea or vomiting., Disp: 30 tablet, Rfl: 1   senna-docusate (SENOKOT-S) 8.6-50 MG tablet, Take 1 tablet by mouth 2 (two) times daily., Disp: 30 tablet, Rfl: 0   Spacer/Aero-Holding Chambers (AEROCHAMBER MV) inhaler, Use as instructed, Disp: 1 each, Rfl: 0   SYRINGE-NEEDLE, DISP, 3 ML 25G X 1-1/2" 3 ML MISC, 1 Device by Does not apply route every 30 (thirty) days. With B12 shot, Disp: 12 each, Rfl: 1   thiamine (VITAMIN B-1) 100 MG tablet, Take 1 tablet (100 mg total) by mouth daily., Disp: 30 tablet, Rfl: 0   umeclidinium bromide (INCRUSE ELLIPTA) 62.5 MCG/ACT AEPB, Inhale 1 puff into the lungs daily. (Patient not taking: Reported on 04/01/2023), Disp: 30 each, Rfl: 0   Vitamin D, Ergocalciferol, (DRISDOL) 1.25 MG (50000 UNIT) CAPS capsule, Take 50,000 Units by mouth every 7 (seven) days., Disp: , Rfl:    zolpidem (AMBIEN) 10 MG tablet, TAKE 1/2 TO 1 TABLET(5 TO 10 MG) BY MOUTH AT BEDTIME AS NEEDED FOR SLEEP, Disp: 30 tablet, Rfl: 2  Physical exam:  Vitals:   04/14/23 0930 04/14/23 0934  BP: (!) 140/69 138/79  Pulse: (!) 55   Resp: 18   Temp: 98 F (36.7 C)   TempSrc: Tympanic   SpO2: 100%   Weight: 167 lb 4.8 oz (75.9 kg)    Physical Exam Cardiovascular:     Rate and Rhythm: Normal rate and regular rhythm.     Heart sounds: Normal heart sounds.  Pulmonary:     Effort: Pulmonary effort is normal.     Breath sounds: Normal breath sounds.  Abdominal:     General: Bowel sounds are normal.     Palpations: Abdomen is soft.  Skin:    General: Skin is warm and dry.  Neurological:     Mental Status: She is alert and oriented to person, place, and time.         Latest Ref Rng & Units 04/07/2023    9:37 AM  CMP  Glucose 70 - 99 mg/dL 161   BUN 6 - 20 mg/dL 22   Creatinine 0.96 - 1.00 mg/dL 0.45   Sodium 409 - 811 mmol/L 135   Potassium 3.5 - 5.1 mmol/L 3.2    Chloride 98 - 111 mmol/L 101   CO2 22 - 32 mmol/L 25   Calcium 8.9 - 10.3 mg/dL 8.9   Total Protein 6.5 - 8.1 g/dL 7.2   Total Bilirubin 0.3 - 1.2 mg/dL 0.3   Alkaline Phos 38 - 126 U/L 65   AST 15 - 41 U/L 11   ALT 0 - 44 U/L 11       Latest Ref Rng & Units 04/14/2023    9:05 AM  CBC  WBC 4.0 - 10.5 K/uL 5.2   Hemoglobin 12.0 - 15.0 g/dL 8.6   Hematocrit 91.4 - 46.0 % 26.9   Platelets 150 - 400 K/uL 201     No images  are attached to the encounter.  DG Chest Port 1 View  Result Date: 03/17/2023 CLINICAL DATA:  Shortness of breath. EXAM: PORTABLE CHEST 1 VIEW COMPARISON:  March 13, 2023. FINDINGS: Stable cardiomediastinal silhouette. Mild bibasilar atelectasis or infiltrates are noted with bilateral pleural effusions. Postsurgical changes are noted in upper thoracic spine. IMPRESSION: Bilateral pleural effusions with adjacent bibasilar atelectasis or infiltrates. Electronically Signed   By: Lupita Raider M.D.   On: 03/17/2023 12:13     Assessment and plan- Patient is a 57 y.o. female with standard risk IgG lambda multiple myeloma R-ISS stage II.  She is here for on treatment assessment prior to cycle 1 day 15 of Darzalex  Counts okay to proceed with cycle 1 day 15 of Darzalex today.  She is on the VRD regimen as per [facility] protocol.  Hemoglobin is gradually improving and renal functions are better with a GFR now in the 50s.  She will directly proceed for cycle 2-day 1 of Darzalex and Velcade next week and I will see her back in 2 weeks for cycle 2-day 8.  She is receiving Velcade twice a week.  I will increase the dose of Revlimid from 10 mg to 15 mg starting cycle 2.  She will be taking Decadron 20 mg once a week  Patient will remain on aspirin prophylaxis along with acyclovir  Patient recently seen by her dentist and we will obtain dental clearance to start Xgeva next week  I am also referring her to [facility] bone marrow transplant team for consideration of autoLog a stem cell  transplant after 4 cycles of induction D VRD treatment  Neoplasm related pain: She will increase Dilaudid from 2 mg to 4 mg every 6 hours as needed and continue fentanyl patch at 50 mcg   Visit Diagnosis 1. Encounter for antineoplastic chemotherapy   2. Multiple myeloma not having achieved remission (HCC)   3. High risk medication use   4. Neoplasm related pain      Dr. Owens Shark, MD, MPH [facility] at [facility] [phone] 04/14/2023 12:49 PM

## 2023-04-15 LAB — PE (RFX IFE+FLC), S
A/G Ratio: 0.7 (ref 0.7–1.7)
Albumin ELP: 2.9 g/dL (ref 2.9–4.4)
Alpha-1-Globulin: 0.3 g/dL (ref 0.0–0.4)
Alpha-2-Globulin: 0.7 g/dL (ref 0.4–1.0)
Beta Globulin: 2.4 g/dL — ABNORMAL HIGH (ref 0.7–1.3)
Gamma Globulin: 0.5 g/dL (ref 0.4–1.8)
Globulin, Total: 3.9 g/dL (ref 2.2–3.9)
IFE Reflex: 0
M-Spike, %: 1.8 g/dL — ABNORMAL HIGH
Total Protein ELP: 6.8 g/dL (ref 6.0–8.5)

## 2023-04-15 LAB — IFE+KAPPA/LAMBDA QN S
IgA: 53 mg/dL — ABNORMAL LOW (ref 87–352)
IgG (Immunoglobin G), Serum: 2800 mg/dL — ABNORMAL HIGH (ref 586–1602)
IgM (Immunoglobulin M), Srm: 60 mg/dL (ref 26–217)
Kappa free light chain: 17.2 mg/L (ref 3.3–19.4)
Kappa, lambda light chain ratio: 0.05 — ABNORMAL LOW (ref 0.26–1.65)
Lambda free light chains: 348.5 mg/L — ABNORMAL HIGH (ref 5.7–26.3)

## 2023-04-16 ENCOUNTER — Telehealth: Payer: Self-pay | Admitting: *Deleted

## 2023-04-16 NOTE — Telephone Encounter (Signed)
Dental clearance form faxed to [facility]; [phone] requiring clearance to begin xgeva injections.

## 2023-04-17 ENCOUNTER — Other Ambulatory Visit: Payer: Self-pay | Admitting: *Deleted

## 2023-04-17 ENCOUNTER — Encounter: Payer: Self-pay | Admitting: Oncology

## 2023-04-17 ENCOUNTER — Other Ambulatory Visit: Payer: Self-pay | Admitting: Oncology

## 2023-04-17 ENCOUNTER — Other Ambulatory Visit: Payer: Self-pay | Admitting: Hospice and Palliative Medicine

## 2023-04-17 DIAGNOSIS — C9000 Multiple myeloma not having achieved remission: Secondary | ICD-10-CM

## 2023-04-17 DIAGNOSIS — Z515 Encounter for palliative care: Secondary | ICD-10-CM

## 2023-04-17 DIAGNOSIS — F332 Major depressive disorder, recurrent severe without psychotic features: Secondary | ICD-10-CM | POA: Diagnosis not present

## 2023-04-17 DIAGNOSIS — F17200 Nicotine dependence, unspecified, uncomplicated: Secondary | ICD-10-CM | POA: Diagnosis not present

## 2023-04-17 DIAGNOSIS — F411 Generalized anxiety disorder: Secondary | ICD-10-CM | POA: Diagnosis not present

## 2023-04-17 LAB — FUNGUS CULTURE WITH STAIN

## 2023-04-17 LAB — FUNGAL ORGANISM REFLEX

## 2023-04-17 LAB — FUNGUS CULTURE RESULT

## 2023-04-17 MED ORDER — HYDROMORPHONE HCL 2 MG PO TABS: 2.0000 mg | ORAL_TABLET | Freq: Four times a day (QID) | ORAL | 0 refills | Status: DC | PRN

## 2023-04-17 NOTE — Progress Notes (Signed)
[facility] Care Clinical Summary 2024-09 Name: Paula Garrett, Paula Garrett DOB: Jun 20, 1965 Patient MRN: [id number]  Referring Provider: Laurette Schimke [facility] Name: Marissa Nestle Date of Last Psychiatric Consultant Review:   Completed Behavioral Health Coach Visits  Completed Behavioral Health Care Mngr. 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 No new action requested. Please reference last Care Plan for most up-to-date behavioral health recommendations.  Safety Plan: No Safety Plan Date:

## 2023-04-18 ENCOUNTER — Other Ambulatory Visit: Payer: Self-pay

## 2023-04-18 DIAGNOSIS — M4804 Spinal stenosis, thoracic region: Secondary | ICD-10-CM

## 2023-04-18 NOTE — Progress Notes (Unsigned)
   REFERRING PHYSICIAN:  Kasandra Knudsen [address] [place],  [place] [address]  DOS: 01/28/23  T4-9 PSF, T6-7 lamiotomy for tumor   HISTORY OF PRESENT ILLNESS: Paula Garrett is approximately *** status post ***. Was given *** on discharge from the hospital.   She is being treated for multiple myeloma. She has been back in hospital since her surgery.   She is on dilaudid and fentanyl patches for her pain. Stopped neurontin due to dizziness.     PHYSICAL EXAMINATION:  General: Patient is well developed, well nourished, calm, collected, and in no apparent distress.   NEUROLOGICAL:  General: In no acute distress.   Awake, alert, oriented to person, place, and time.  Pupils equal round and reactive to light.  Facial tone is symmetric.     Strength:            Side Iliopsoas Quads Hamstring PF DF EHL  R 5 5 5 5 5 5   L 5 5 5 5 5 5    Incision c/d/i   ROS (Neurologic):  Negative except as noted above  IMAGING: Thoracic xrays dated ***:  ***  Radiology report not yet available.   ASSESSMENT/PLAN:  Paula Garrett is doing well s/p above surgery. Treatment options reviewed with patient and following plan made:   - I have advised the patient to lift up to 10 pounds until 6 weeks after surgery (follow up with Dr. Myer Haff).  - Reviewed wound care.  - No bending, twisting, or lifting.  - Continue on current medications including ***.  - Follow up as scheduled in 4 weeks and prn.   Advised to contact the office if any questions or concerns arise.  Drake Leach PA-C Department of neurosurgery

## 2023-04-21 ENCOUNTER — Ambulatory Visit (INDEPENDENT_AMBULATORY_CARE_PROVIDER_SITE_OTHER): Payer: Managed Care, Other (non HMO) | Admitting: Orthopedic Surgery

## 2023-04-21 ENCOUNTER — Encounter: Payer: Self-pay | Admitting: Orthopedic Surgery

## 2023-04-21 ENCOUNTER — Inpatient Hospital Stay: Payer: Managed Care, Other (non HMO) | Attending: Oncology

## 2023-04-21 ENCOUNTER — Ambulatory Visit
Admission: RE | Admit: 2023-04-21 | Discharge: 2023-04-21 | Disposition: A | Discharge status: Home / Self Care | Payer: Managed Care, Other (non HMO) | Attending: Orthopedic Surgery | Admitting: Orthopedic Surgery

## 2023-04-21 ENCOUNTER — Encounter: Payer: Self-pay | Admitting: Oncology

## 2023-04-21 ENCOUNTER — Ambulatory Visit
Admission: RE | Admit: 2023-04-21 | Discharge: 2023-04-21 | Disposition: A | Discharge status: Home / Self Care | Payer: Managed Care, Other (non HMO) | Source: Ambulatory Visit | Attending: Orthopedic Surgery | Admitting: Orthopedic Surgery

## 2023-04-21 ENCOUNTER — Inpatient Hospital Stay: Payer: Managed Care, Other (non HMO)

## 2023-04-21 VITALS — BP 136/83 | HR 90 | Temp 97.9°F | Resp 20 | Wt 160.8 lb

## 2023-04-21 VITALS — BP 134/82 | Ht 62.0 in | Wt 160.0 lb

## 2023-04-21 DIAGNOSIS — Z5112 Encounter for antineoplastic immunotherapy: Secondary | ICD-10-CM | POA: Diagnosis present

## 2023-04-21 DIAGNOSIS — C9000 Multiple myeloma not having achieved remission: Secondary | ICD-10-CM | POA: Insufficient documentation

## 2023-04-21 DIAGNOSIS — Z79624 Long term (current) use of inhibitors of nucleotide synthesis: Secondary | ICD-10-CM | POA: Diagnosis not present

## 2023-04-21 DIAGNOSIS — R0981 Nasal congestion: Secondary | ICD-10-CM | POA: Insufficient documentation

## 2023-04-21 DIAGNOSIS — M4804 Spinal stenosis, thoracic region: Secondary | ICD-10-CM | POA: Diagnosis present

## 2023-04-21 DIAGNOSIS — D509 Iron deficiency anemia, unspecified: Secondary | ICD-10-CM

## 2023-04-21 DIAGNOSIS — G893 Neoplasm related pain (acute) (chronic): Secondary | ICD-10-CM | POA: Diagnosis not present

## 2023-04-21 DIAGNOSIS — Z800 Family history of malignant neoplasm of digestive organs: Secondary | ICD-10-CM | POA: Insufficient documentation

## 2023-04-21 DIAGNOSIS — Z923 Personal history of irradiation: Secondary | ICD-10-CM | POA: Diagnosis not present

## 2023-04-21 DIAGNOSIS — Z8719 Personal history of other diseases of the digestive system: Secondary | ICD-10-CM | POA: Diagnosis not present

## 2023-04-21 DIAGNOSIS — Z7982 Long term (current) use of aspirin: Secondary | ICD-10-CM | POA: Diagnosis not present

## 2023-04-21 DIAGNOSIS — Z981 Arthrodesis status: Secondary | ICD-10-CM

## 2023-04-21 DIAGNOSIS — G9589 Other specified diseases of spinal cord: Secondary | ICD-10-CM

## 2023-04-21 DIAGNOSIS — Z79899 Other long term (current) drug therapy: Secondary | ICD-10-CM | POA: Insufficient documentation

## 2023-04-21 DIAGNOSIS — Z9884 Bariatric surgery status: Secondary | ICD-10-CM | POA: Insufficient documentation

## 2023-04-21 DIAGNOSIS — Z87891 Personal history of nicotine dependence: Secondary | ICD-10-CM | POA: Diagnosis not present

## 2023-04-21 DIAGNOSIS — Z79891 Long term (current) use of opiate analgesic: Secondary | ICD-10-CM | POA: Insufficient documentation

## 2023-04-21 DIAGNOSIS — Z7984 Long term (current) use of oral hypoglycemic drugs: Secondary | ICD-10-CM | POA: Insufficient documentation

## 2023-04-21 LAB — CMP (CANCER CENTER ONLY)
ALT: 13 U/L (ref 0–44)
AST: 25 U/L (ref 15–41)
Albumin: 3.4 g/dL — ABNORMAL LOW (ref 3.5–5.0)
Alkaline Phosphatase: 105 U/L (ref 38–126)
Anion gap: 9 (ref 5–15)
BUN: 12 mg/dL (ref 6–20)
CO2: 24 mmol/L (ref 22–32)
Calcium: 9.7 mg/dL (ref 8.9–10.3)
Chloride: 102 mmol/L (ref 98–111)
Creatinine: 1.16 mg/dL — ABNORMAL HIGH (ref 0.44–1.00)
GFR, Estimated: 54 mL/min — ABNORMAL LOW (ref >60)
Glucose, Bld: 118 mg/dL — ABNORMAL HIGH (ref 70–99)
Potassium: 4.4 mmol/L (ref 3.5–5.1)
Sodium: 135 mmol/L (ref 135–145)
Total Bilirubin: 0.3 mg/dL (ref <1.2)
Total Protein: 7.8 g/dL (ref 6.5–8.1)

## 2023-04-21 LAB — CBC WITH DIFFERENTIAL (CANCER CENTER ONLY)
Abs Immature Granulocytes: 0.02 10*3/uL (ref 0.00–0.07)
Basophils Absolute: 0 10*3/uL (ref 0.0–0.1)
Basophils Relative: 1 %
Eosinophils Absolute: 0.1 10*3/uL (ref 0.0–0.5)
Eosinophils Relative: 2 %
HCT: 29.3 % — ABNORMAL LOW (ref 36.0–46.0)
Hemoglobin: 9.7 g/dL — ABNORMAL LOW (ref 12.0–15.0)
Immature Granulocytes: 0 %
Lymphocytes Relative: 7 %
Lymphs Abs: 0.5 10*3/uL — ABNORMAL LOW (ref 0.7–4.0)
MCH: 29.7 pg (ref 26.0–34.0)
MCHC: 33.1 g/dL (ref 30.0–36.0)
MCV: 89.6 fL (ref 80.0–100.0)
Monocytes Absolute: 0.7 10*3/uL (ref 0.1–1.0)
Monocytes Relative: 11 %
Neutro Abs: 5.4 10*3/uL (ref 1.7–7.7)
Neutrophils Relative %: 79 %
Platelet Count: 515 10*3/uL — ABNORMAL HIGH (ref 150–400)
RBC: 3.27 MIL/uL — ABNORMAL LOW (ref 3.87–5.11)
RDW: 15.9 % — ABNORMAL HIGH (ref 11.5–15.5)
WBC Count: 6.7 10*3/uL (ref 4.0–10.5)
nRBC: 0 % (ref 0.0–0.2)

## 2023-04-21 MED ORDER — DENOSUMAB 120 MG/1.7ML ~~LOC~~ SOLN
120.0000 mg | SUBCUTANEOUS | Status: DC
  Administered 2023-04-21: 120 mg via SUBCUTANEOUS
  Filled 2023-04-21: qty 1.7

## 2023-04-21 MED ORDER — BORTEZOMIB CHEMO SQ INJECTION 3.5 MG (2.5MG/ML)
1.3000 mg/m2 | Freq: Once | INTRAMUSCULAR | Status: AC
  Administered 2023-04-21: 2.25 mg via SUBCUTANEOUS
  Filled 2023-04-21: qty 0.9

## 2023-04-21 MED ORDER — DEXAMETHASONE 4 MG PO TABS
20.0000 mg | ORAL_TABLET | Freq: Once | ORAL | Status: AC
  Administered 2023-04-21: 20 mg via ORAL
  Filled 2023-04-21: qty 5

## 2023-04-21 MED ORDER — DIPHENHYDRAMINE HCL 25 MG PO CAPS
50.0000 mg | ORAL_CAPSULE | Freq: Once | ORAL | Status: AC
  Administered 2023-04-21: 50 mg via ORAL
  Filled 2023-04-21: qty 2

## 2023-04-21 MED ORDER — ACETAMINOPHEN 325 MG PO TABS
650.0000 mg | ORAL_TABLET | Freq: Once | ORAL | Status: AC
  Administered 2023-04-21: 650 mg via ORAL
  Filled 2023-04-21: qty 2

## 2023-04-21 MED ORDER — DARATUMUMAB-HYALURONIDASE-FIHJ 1800-30000 MG-UT/15ML ~~LOC~~ SOLN
1800.0000 mg | Freq: Once | SUBCUTANEOUS | Status: AC
  Administered 2023-04-21: 1800 mg via SUBCUTANEOUS
  Filled 2023-04-21: qty 15

## 2023-04-22 ENCOUNTER — Encounter: Payer: Self-pay | Admitting: Oncology

## 2023-04-22 ENCOUNTER — Encounter: Payer: Self-pay | Admitting: Orthopedic Surgery

## 2023-04-24 ENCOUNTER — Encounter: Payer: Self-pay | Admitting: Oncology

## 2023-04-24 ENCOUNTER — Inpatient Hospital Stay: Payer: Managed Care, Other (non HMO)

## 2023-04-24 VITALS — BP 139/75 | HR 94 | Temp 98.9°F | Resp 20 | Wt 166.0 lb

## 2023-04-24 DIAGNOSIS — C9000 Multiple myeloma not having achieved remission: Secondary | ICD-10-CM

## 2023-04-24 DIAGNOSIS — Z5112 Encounter for antineoplastic immunotherapy: Secondary | ICD-10-CM | POA: Diagnosis not present

## 2023-04-24 MED ORDER — BORTEZOMIB CHEMO SQ INJECTION 3.5 MG (2.5MG/ML)
1.3000 mg/m2 | Freq: Once | INTRAMUSCULAR | Status: AC
  Administered 2023-04-24: 2.25 mg via SUBCUTANEOUS
  Filled 2023-04-24: qty 0.9

## 2023-04-24 MED ORDER — PROCHLORPERAZINE MALEATE 10 MG PO TABS
10.0000 mg | ORAL_TABLET | Freq: Once | ORAL | Status: AC
Start: 2023-04-24 — End: 2023-04-24
  Administered 2023-04-24: 10 mg via ORAL
  Filled 2023-04-24: qty 1

## 2023-04-24 NOTE — Patient Instructions (Signed)
[facility] - A DEPT OF [facility]  Discharge Instructions: Thank you for choosing [facility] to provide your oncology and hematology care.  If you have a lab appointment with the Cancer Center, please go directly to the Cancer Center and check in at the registration area.  Wear comfortable clothing and clothing appropriate for easy access to any Portacath or PICC line.   We strive to give you quality time with your provider. You may need to reschedule your appointment if you arrive late (15 or more minutes).  Arriving late affects you and other patients whose appointments are after yours.  Also, if you miss three or more appointments without notifying the office, you may be dismissed from the clinic at the provider's discretion.      For prescription refill requests, have your pharmacy contact our office and allow 72 hours for refills to be completed.    Today you received the following chemotherapy and/or immunotherapy agents Velcade      To help prevent nausea and vomiting after your treatment, we encourage you to take your nausea medication as directed.  BELOW ARE SYMPTOMS THAT SHOULD BE REPORTED IMMEDIATELY: *FEVER GREATER THAN 100.4 F (38 C) OR HIGHER *CHILLS OR SWEATING *NAUSEA AND VOMITING THAT IS NOT CONTROLLED WITH YOUR NAUSEA MEDICATION *UNUSUAL SHORTNESS OF BREATH *UNUSUAL BRUISING OR BLEEDING *URINARY PROBLEMS (pain or burning when urinating, or frequent urination) *BOWEL PROBLEMS (unusual diarrhea, constipation, pain near the anus) TENDERNESS IN MOUTH AND THROAT WITH OR WITHOUT PRESENCE OF ULCERS (sore throat, sores in mouth, or a toothache) UNUSUAL RASH, SWELLING OR PAIN  UNUSUAL VAGINAL DISCHARGE OR ITCHING   Items with * indicate a potential emergency and should be followed up as soon as possible or go to the Emergency Department if any problems should occur.  Please show the CHEMOTHERAPY ALERT CARD or IMMUNOTHERAPY ALERT  CARD at check-in to the Emergency Department and triage nurse.  Should you have questions after your visit or need to cancel or reschedule your appointment, please contact [facility] - A DEPT OF [facility]  [phone] and follow the prompts.  Office hours are 8:00 a.m. to 4:30 p.m. Monday - Friday. Please note that voicemails left after 4:00 p.m. may not be returned until the following business day.  We are closed weekends and major holidays. You have access to a nurse at all times for urgent questions. Please call the main number to the clinic [phone] and follow the prompts.  For any non-urgent questions, you may also contact your provider using MyChart. We now offer e-Visits for anyone 29 and older to request care online for non-urgent symptoms. For details visit [email or web address].   Also download the MyChart app! Go to the app store, search "MyChart", open the app, select [facility], and log in with your MyChart username and password.

## 2023-04-25 ENCOUNTER — Other Ambulatory Visit: Payer: Self-pay

## 2023-04-25 ENCOUNTER — Encounter: Payer: Self-pay | Admitting: Oncology

## 2023-04-25 DIAGNOSIS — Z0000 Encounter for general adult medical examination without abnormal findings: Secondary | ICD-10-CM

## 2023-04-25 DIAGNOSIS — R7303 Prediabetes: Secondary | ICD-10-CM

## 2023-04-25 DIAGNOSIS — E039 Hypothyroidism, unspecified: Secondary | ICD-10-CM

## 2023-04-25 DIAGNOSIS — I10 Essential (primary) hypertension: Secondary | ICD-10-CM

## 2023-04-25 DIAGNOSIS — E785 Hyperlipidemia, unspecified: Secondary | ICD-10-CM

## 2023-04-25 DIAGNOSIS — E538 Deficiency of other specified B group vitamins: Secondary | ICD-10-CM

## 2023-04-28 ENCOUNTER — Other Ambulatory Visit: Payer: Managed Care, Other (non HMO)

## 2023-04-28 ENCOUNTER — Encounter: Payer: Self-pay | Admitting: Oncology

## 2023-04-28 ENCOUNTER — Inpatient Hospital Stay: Payer: Managed Care, Other (non HMO)

## 2023-04-28 ENCOUNTER — Inpatient Hospital Stay (HOSPITAL_BASED_OUTPATIENT_CLINIC_OR_DEPARTMENT_OTHER): Payer: Managed Care, Other (non HMO) | Admitting: Oncology

## 2023-04-28 ENCOUNTER — Ambulatory Visit
Admission: RE | Admit: 2023-04-28 | Discharge: 2023-04-28 | Disposition: A | Discharge status: Home / Self Care | Payer: Managed Care, Other (non HMO) | Source: Ambulatory Visit | Attending: Radiation Oncology | Admitting: Radiation Oncology

## 2023-04-28 ENCOUNTER — Encounter: Payer: Self-pay | Admitting: Radiation Oncology

## 2023-04-28 VITALS — BP 123/80 | HR 82 | Temp 98.6°F | Resp 18 | Wt 171.2 lb

## 2023-04-28 DIAGNOSIS — Z79899 Other long term (current) drug therapy: Secondary | ICD-10-CM

## 2023-04-28 DIAGNOSIS — Z923 Personal history of irradiation: Secondary | ICD-10-CM | POA: Insufficient documentation

## 2023-04-28 DIAGNOSIS — G893 Neoplasm related pain (acute) (chronic): Secondary | ICD-10-CM

## 2023-04-28 DIAGNOSIS — Z5111 Encounter for antineoplastic chemotherapy: Secondary | ICD-10-CM | POA: Diagnosis not present

## 2023-04-28 DIAGNOSIS — C9000 Multiple myeloma not having achieved remission: Secondary | ICD-10-CM | POA: Insufficient documentation

## 2023-04-28 DIAGNOSIS — Z5112 Encounter for antineoplastic immunotherapy: Secondary | ICD-10-CM | POA: Diagnosis not present

## 2023-04-28 LAB — CBC WITH DIFFERENTIAL (CANCER CENTER ONLY)
Abs Immature Granulocytes: 0 10*3/uL (ref 0.00–0.07)
Basophils Absolute: 0.1 10*3/uL (ref 0.0–0.1)
Basophils Relative: 2 %
Eosinophils Absolute: 0.1 10*3/uL (ref 0.0–0.5)
Eosinophils Relative: 5 %
HCT: 29.3 % — ABNORMAL LOW (ref 36.0–46.0)
Hemoglobin: 9.4 g/dL — ABNORMAL LOW (ref 12.0–15.0)
Immature Granulocytes: 0 %
Lymphocytes Relative: 14 %
Lymphs Abs: 0.4 10*3/uL — ABNORMAL LOW (ref 0.7–4.0)
MCH: 29.7 pg (ref 26.0–34.0)
MCHC: 32.1 g/dL (ref 30.0–36.0)
MCV: 92.7 fL (ref 80.0–100.0)
Monocytes Absolute: 0.2 10*3/uL (ref 0.1–1.0)
Monocytes Relative: 8 %
Neutro Abs: 2.1 10*3/uL (ref 1.7–7.7)
Neutrophils Relative %: 71 %
Platelet Count: 311 10*3/uL (ref 150–400)
RBC: 3.16 MIL/uL — ABNORMAL LOW (ref 3.87–5.11)
RDW: 15.8 % — ABNORMAL HIGH (ref 11.5–15.5)
WBC Count: 2.9 10*3/uL — ABNORMAL LOW (ref 4.0–10.5)
nRBC: 0 % (ref 0.0–0.2)

## 2023-04-28 LAB — CMP (CANCER CENTER ONLY)
ALT: 20 U/L (ref 0–44)
AST: 16 U/L (ref 15–41)
Albumin: 3.2 g/dL — ABNORMAL LOW (ref 3.5–5.0)
Alkaline Phosphatase: 96 U/L (ref 38–126)
Anion gap: 8 (ref 5–15)
BUN: 18 mg/dL (ref 6–20)
CO2: 23 mmol/L (ref 22–32)
Calcium: 7 mg/dL — ABNORMAL LOW (ref 8.9–10.3)
Chloride: 104 mmol/L (ref 98–111)
Creatinine: 1.06 mg/dL — ABNORMAL HIGH (ref 0.44–1.00)
GFR, Estimated: >60 mL/min (ref >60)
Glucose, Bld: 172 mg/dL — ABNORMAL HIGH (ref 70–99)
Potassium: 4.1 mmol/L (ref 3.5–5.1)
Sodium: 135 mmol/L (ref 135–145)
Total Bilirubin: 0.3 mg/dL (ref <1.2)
Total Protein: 6.7 g/dL (ref 6.5–8.1)

## 2023-04-28 MED ORDER — DARATUMUMAB-HYALURONIDASE-FIHJ 1800-30000 MG-UT/15ML ~~LOC~~ SOLN
1800.0000 mg | Freq: Once | SUBCUTANEOUS | Status: AC
  Administered 2023-04-28: 1800 mg via SUBCUTANEOUS
  Filled 2023-04-28: qty 15

## 2023-04-28 MED ORDER — DEXAMETHASONE 4 MG PO TABS
20.0000 mg | ORAL_TABLET | Freq: Once | ORAL | Status: AC
  Administered 2023-04-28: 20 mg via ORAL
  Filled 2023-04-28: qty 5

## 2023-04-28 MED ORDER — BORTEZOMIB CHEMO SQ INJECTION 3.5 MG (2.5MG/ML)
1.3000 mg/m2 | Freq: Once | INTRAMUSCULAR | Status: AC
  Administered 2023-04-28: 2.25 mg via SUBCUTANEOUS
  Filled 2023-04-28: qty 0.9

## 2023-04-28 MED ORDER — ACETAMINOPHEN 325 MG PO TABS
650.0000 mg | ORAL_TABLET | Freq: Once | ORAL | Status: AC
  Administered 2023-04-28: 650 mg via ORAL
  Filled 2023-04-28: qty 2

## 2023-04-28 MED ORDER — DIPHENHYDRAMINE HCL 25 MG PO CAPS
50.0000 mg | ORAL_CAPSULE | Freq: Once | ORAL | Status: AC
  Administered 2023-04-28: 50 mg via ORAL
  Filled 2023-04-28: qty 2

## 2023-04-28 NOTE — Progress Notes (Signed)
Radiation Oncology Follow up Note  Name: Paula Garrett   Date:   04/28/2023 MRN:  [id number] DOB: Sep 07, 1965    This 57 y.o. female presents to the clinic today for 1 month follow-up status post palliative radiation therapy.  To her thoracic spine for involvement of multiple myeloma.  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 She reports that her back pain is gradually improving. She is currently on Velcade and Darzalex, and the dosage is expected to be increased in the next cycle. She denies any significant pain, except for a mild discomfort at the incision site. The patient denies any other pain or discomfort and reports being able to walk fine..  COMPLICATIONS OF TREATMENT: none  FOLLOW UP COMPLIANCE: keeps appointments   PHYSICAL EXAM:  There were no vitals taken for this visit. Deep palpation of her spine does not also pain range of motion of her lower extremities does not elicit pain.  Well-developed well-nourished patient in NAD. HEENT reveals PERLA, EOMI, discs not visualized.  Oral cavity is clear. No oral mucosal lesions are identified. Neck is clear without evidence of cervical or supraclavicular adenopathy. Lungs are clear to A&P. Cardiac examination is essentially unremarkable with regular rate and rhythm without murmur rub or thrill. Abdomen is benign with no organomegaly or masses noted. Motor sensory and DTR levels are equal and symmetric in the upper and lower extremities. Cranial nerves II through XII are grossly intact. Proprioception is intact. No peripheral adenopathy or edema is identified. No motor or sensory levels are noted. Crude visual fields are within normal range.  RADIOLOGY RESULTS: No current films for review  PLAN: IgG Lambda Multiple Myeloma Completed palliative radiation therapy to the  spine. Currently on Velcade and Darzalex. Improvement in back pain. Scheduled to meet with the [facility] transplant team. -Continue current treatment regimen. -Refer back to radiation oncology if additional spots need treatment as determined by the primary oncology team. -No routine follow-up needed with radiation oncology at this time.    Carmina Miller, MD

## 2023-04-28 NOTE — Progress Notes (Signed)
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 She was last seen by me in April 2024 and at that time she had complained of mild left chest wall pain radiating to her back.  She underwent cardiac workup and I had done a bone survey at that time which did not show any evidence of bone disease.  Her smoldering multiple myeloma at that time was stable.  Subsequently she has had chest x-ray in May 2024 as well as a rib x-ray in July 2024.  None of these showed any abnormalities.  Her pain has gradually worsened in the last 2 months but especially so in the last 1 week.  She presented to the ER with symptoms of pleuritic chest wall pain and shortness of breath and underwent a CT angio chest which showed a soft tissue mass with bone destruction involving the left seventh rib as well as T6 and T7 vertebral bodies and central canal.  Possibility of cord compression.  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   Results of myeloma work-up from 06/20/2020 showed an  elevated IgG of 3668.  M protein was elevated at 3.1 and IFE showed IgG monoclonal lambda protein.  Beta-2 microglobulin and LDH were normal.  Serum free light chain ratio was elevated at 25 with a free light chain lambda of 303.  Bone marrow biopsy showed increased plasma cells 22% by manual count and 20% by IHC staining for CD138.  Cytogenetics for myeloma showed gain of 1 q.  PET CT scan was not approved by insurance.  MRI cervical and lumbar thoracic spine did not show any evidence of lytic lesions.  Bone survey was negative for bone lesions as well.  MRI pelvis with and without contrast showed diffuse patchy heterogeneous marrow concerning for myeloma    Patient had a repeat bone marrow biopsy inAugust 2024 which showed 20% overall plasma cells and hypercellular marrow consistent with plasma cell myeloma.  Cytogenetics normal and FISH studies did not show any abnormalities.  Patient had stabilization procedure/spinal fusion T4-T9 with T6-T7 laminotomies.  Spinal mass biopsy was consistent with plasma cell neoplasm. PET CT scan showed solitary hypermetabolic lytic lesion in the L4 vertebral body consistent with multiple myeloma.  Residual soft tissue density at the left paraspinal resection site T6-T7 with no significant metabolic activity.   Plan was to do outpatient daratumumab Revlimid Velcade dexamethasone regimen.  However patient was found to be in acute kidney failure with a creatinine of 5 in August 2024 and was sent to the ER.  She received cycle 1 of CyBorD in the hospital with improvement in her  creatinine with stabilized between 1.5-1.7.  Treatment has been complicated by repeated hospitalizations for acute hypoxic respiratory failure and left pleural effusion which has been treated with antibiotics and thoracentesis.    Subsequently patient's clinical condition improved and she started D VRD regimen as per Valentina Lucks protocol on 03/31/2023  Interval history-tolerating treatments well so far.   Denies any nausea vomiting or diarrhea.  Denies any worsening cough or shortness of breath.  She reports some symptoms of nasal congestion.  She is not using her fentanyl patch and pain is currently well-controlled with as needed Dilaudid  ECOG PS- 1 Pain scale- 3 Opioid associated constipation- no  Review of systems- Review of Systems  Constitutional:  Negative for chills, fever, malaise/fatigue and weight loss.  HENT:  Positive for congestion. Negative for ear discharge and nosebleeds.   Eyes:  Negative for blurred vision.  Respiratory:  Negative for cough, hemoptysis, sputum production, shortness of breath and wheezing.   Cardiovascular:  Negative for chest pain, palpitations, orthopnea and claudication.  Gastrointestinal:  Negative for abdominal pain, blood in stool, constipation, diarrhea, heartburn, melena, nausea and vomiting.  Genitourinary:  Negative for dysuria, flank pain, frequency, hematuria and urgency.  Musculoskeletal:  Negative for back pain, joint pain and myalgias.  Skin:  Negative for rash.  Neurological:  Negative for dizziness, tingling, focal weakness, seizures, weakness and headaches.  Endo/Heme/Allergies:  Does not bruise/bleed easily.  Psychiatric/Behavioral:  Negative for depression and suicidal ideas. The patient does not have insomnia.       Allergies  Allergen Reactions   Hctz [Hydrochlorothiazide]     Hyponatremia      Past Medical History:  Diagnosis Date   Anxiety    Arthritis    Asthma    Barrett's esophagus 2015   COVID-19    03/10/20   Depression    GERD (gastroesophageal reflux disease)    Hypertension    Hypothyroidism    Pneumonia    Smoldering myeloma    Thyroid disease      Past Surgical History:  Procedure Laterality Date   ABLATION  2006   APPLICATION OF INTRAOPERATIVE CT SCAN N/A 01/28/2023   Procedure: APPLICATION OF INTRAOPERATIVE CT SCAN;  Surgeon: Loreen Freud, MD;  Location: [facility] ORS;  Service: Neurosurgery;   Laterality: N/A;   BREAST CYST ASPIRATION Left 1998   BREAST SURGERY     cyst aspiration   BREAST SURGERY  1998   cyst aspiration   COLONOSCOPY  03/2014   COLONOSCOPY WITH PROPOFOL N/A 01/06/2020   Procedure: COLONOSCOPY WITH PROPOFOL;  Surgeon: Toledo, Boykin Nearing, MD;  Location: [facility] ENDOSCOPY;  Service: Gastroenterology;  Laterality: N/A;   ENDOMETRIAL ABLATION     ESOPHAGOGASTRODUODENOSCOPY (EGD) WITH PROPOFOL N/A 08/15/2015   Procedure: ESOPHAGOGASTRODUODENOSCOPY (EGD) WITH PROPOFOL;  Surgeon: Christena Deem, MD;  Location: [facility] ENDOSCOPY;  Service: Endoscopy;  Laterality: N/A;   ESOPHAGOGASTRODUODENOSCOPY (EGD) WITH PROPOFOL N/A 01/22/2016   Procedure: ESOPHAGOGASTRODUODENOSCOPY (EGD) WITH PROPOFOL;  Surgeon: Christena Deem, MD;  Location: [facility] ENDOSCOPY;  Service: Endoscopy;  Laterality: N/A;   ESOPHAGOGASTRODUODENOSCOPY (EGD) WITH PROPOFOL N/A 01/06/2020   Procedure: ESOPHAGOGASTRODUODENOSCOPY (EGD) WITH PROPOFOL;  Surgeon: Toledo, Boykin Nearing, MD;  Location: [facility] ENDOSCOPY;  Service: Gastroenterology;  Laterality: N/A;   FLEXIBLE BRONCHOSCOPY Bilateral 03/13/2023   Procedure: FLEXIBLE BRONCHOSCOPY;  Surgeon: Erin Fulling, MD;  Location: [facility] ORS;  Service: Cardiopulmonary;  Laterality: Bilateral;   GASTRIC BYPASS  2005   HERNIA REPAIR     IR BONE MARROW BIOPSY & ASPIRATION  02/05/2023  NASAL SINUS SURGERY     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   TOTAL THYROIDECTOMY     TUBAL LIGATION      Social History   Socioeconomic History   Marital status: Divorced    Spouse name: Not on file   Number of children: 2   Years of education: Not on file   Highest education level: GED or equivalent  Occupational History   Occupation: Part-time  Tobacco Use   Smoking status: Former    Types: Cigarettes   Smokeless tobacco: Never   Tobacco comments:    Quit smoking 02/16/2023  Vaping Use   Vaping status: Never Used  Substance and Sexual Activity   Alcohol use: Yes     Alcohol/week: 10.0 - 12.0 standard drinks of alcohol    Types: 8 - 10 Glasses of wine, 2 Standard drinks or equivalent per week   Drug use: No   Sexual activity: Yes    Partners: Male  Other Topics Concern   Not on file  Social History Narrative   Lives in [place] single, divorced       Work - 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       Diet - healthy diet   Exercise - limited   Caffeine use: daily   Social Determinants of Health   Financial Resource Strain: Low Risk  (11/01/2022)   Overall Financial Resource Strain (CARDIA)    Difficulty of Paying Living Expenses: Not very hard  Food Insecurity: No Food Insecurity (03/18/2023)   Hunger Vital Sign    Worried About Running Out of Food in the Last Year: Never true    Ran Out of Food in the Last Year: Never true  Transportation Needs: No Transportation Needs (03/18/2023)   PRAPARE - Administrator, Civil Service (Medical): No    Lack of Transportation (Non-Medical): No  Physical Activity: Insufficiently Active (11/01/2022)   Exercise Vital Sign    Days of Exercise per Week: 3 days    Minutes of Exercise per Session: 20 min  Stress: Stress Concern Present (03/04/2023)   [employer] of Occupational Health - Occupational Stress Questionnaire    Feeling of Stress : Very much  Social Connections: Socially Isolated (03/04/2023)   Social Connection and Isolation Panel [NHANES]    Frequency of Communication with Friends and Family: Never    Frequency of Social Gatherings with Friends and Family: Never    Attends Religious Services: Never    Database administrator or Organizations: No    Attends Banker Meetings: Never    Marital Status: Divorced  Catering manager Violence: Not At Risk (03/06/2023)   Humiliation, Afraid, Rape, and Kick questionnaire    Fear of Current or Ex-Partner: No    Emotionally Abused: No    Physically Abused: No    Sexually Abused: No    Family History  Problem Relation Age of  Onset   Heart disease Mother    COPD Mother    Arthritis Mother    Cancer Mother        uterine   Lupus Mother    Anxiety disorder Mother    Depression Mother    Drug abuse Mother    COPD Father    Arthritis Father    Heart disease Father    Heart attack Father    Alcohol abuse Father    Heart disease Brother    Heart attack Brother    Drug abuse  Brother    Anxiety disorder Brother    Depression Brother    Heart disease Brother    Cancer Brother        liver cancer age 60 y.o   Diabetes Maternal Grandmother    Diabetes Maternal Grandfather    Diabetes Paternal Grandmother    Diabetes Paternal Grandfather    Breast cancer Neg Hx      Current Outpatient Medications:    acyclovir (ZOVIRAX) 400 MG tablet, Take 1 tablet (400 mg total) by mouth 2 (two) times daily., Disp: 60 tablet, Rfl: 5   amLODipine (NORVASC) 10 MG tablet, Take 1 tablet (10 mg total) by mouth daily., Disp: 90 tablet, Rfl: 3   aspirin EC 81 MG tablet, Take 1 tablet (81 mg total) by mouth daily. Swallow whole., Disp: 30 tablet, Rfl: 2   aspirin EC 81 MG tablet, Take 1 tablet (81 mg total) by mouth daily., Disp: 100 tablet, Rfl: 3   butalbital-acetaminophen-caffeine (FIORICET) 50-325-40 MG tablet, Take 1 tablet by mouth every 4 (four) hours as needed for headache., Disp: 14 tablet, Rfl: 0   clonazePAM (KLONOPIN) 1 MG tablet, TAKE 1 TABLET(1 MG) BY MOUTH TWICE DAILY, Disp: 60 tablet, Rfl: 2   cyanocobalamin (VITAMIN B12) 1000 MCG/ML injection, INJECT 1 ML IN THE MUSCLE EVERY 30 DAYS, Disp: 4 mL, Rfl: 12   cyclobenzaprine (FLEXERIL) 10 MG tablet, Take 0.5-1 tablets (5-10 mg total) by mouth 3 (three) times daily as needed for muscle spasms., Disp: 60 tablet, Rfl: 1   dexamethasone (DECADRON) 4 MG tablet, Take 5 tabs (20 mg) weekly the day after daratumumab for 12 weeks. Take with breakfast., Disp: 20 tablet, Rfl: 5   docusate sodium (COLACE) 100 MG capsule, Take 1 capsule (100 mg total) by mouth 2 (two) times daily.,  Disp: 10 capsule, Rfl: 0   escitalopram (LEXAPRO) 10 MG tablet, Take 1 tablet (10 mg total) by mouth daily., Disp: 30 tablet, Rfl: 2   fentaNYL (DURAGESIC) 50 MCG/HR, Place 1 patch onto the skin every 3 (three) days., Disp: 5 patch, Rfl: 0   HYDROmorphone (DILAUDID) 2 MG tablet, Take 1-2 tablets (2-4 mg total) by mouth every 6 (six) hours as needed for severe pain (pain score 7-10)., Disp: 60 tablet, Rfl: 0   ipratropium-albuterol (DUONEB) 0.5-2.5 (3) MG/3ML SOLN, Take 3 mLs by nebulization 4 (four) times daily., Disp: 360 mL, Rfl: 0   lenalidomide (REVLIMID) 10 MG capsule, Take 1 capsule (10 mg total) by mouth daily. Daily 10 mg for 14 days and 7 days off. REMS: [id number] obtained:04/17/2023, Disp: 14 capsule, Rfl: 0   levothyroxine (SYNTHROID) 125 MCG tablet, Take 2 tablets (250 mcg total) by mouth daily before breakfast., Disp: 60 tablet, Rfl: 0   ondansetron (ZOFRAN) 8 MG tablet, Take 1 tablet (8 mg total) by mouth every 8 (eight) hours as needed for nausea or vomiting., Disp: 30 tablet, Rfl: 1   pantoprazole (PROTONIX) 40 MG tablet, 30 min before lunch or dinner, Disp: 90 tablet, Rfl: 3   polyethylene glycol (MIRALAX / GLYCOLAX) 17 g packet, Take 17 g by mouth 2 (two) times daily., Disp: 14 each, Rfl: 0   prochlorperazine (COMPAZINE) 10 MG tablet, Take 1 tablet (10 mg total) by mouth every 6 (six) hours as needed for nausea or vomiting., Disp: 30 tablet, Rfl: 1   senna-docusate (SENOKOT-S) 8.6-50 MG tablet, Take 1 tablet by mouth 2 (two) times daily., Disp: 30 tablet, Rfl: 0   Spacer/Aero-Holding Chambers (AEROCHAMBER MV) inhaler, Use as instructed, Disp:  1 each, Rfl: 0   SYRINGE-NEEDLE, DISP, 3 ML 25G X 1-1/2" 3 ML MISC, 1 Device by Does not apply route every 30 (thirty) days. With B12 shot, Disp: 12 each, Rfl: 1   thiamine (VITAMIN B-1) 100 MG tablet, Take 1 tablet (100 mg total) by mouth daily., Disp: 30 tablet, Rfl: 0   Vitamin D, Ergocalciferol, (DRISDOL) 1.25 MG (50000 UNIT) CAPS capsule,  Take 50,000 Units by mouth every 7 (seven) days., Disp: , Rfl:    zolpidem (AMBIEN) 10 MG tablet, TAKE 1/2 TO 1 TABLET(5 TO 10 MG) BY MOUTH AT BEDTIME AS NEEDED FOR SLEEP, Disp: 30 tablet, Rfl: 2 No current facility-administered medications for this visit.  Facility-Administered Medications Ordered in Other Visits:    bortezomib SQ (VELCADE) chemo injection (2.5mg /mL concentration) 2.25 mg, 1.3 mg/m2 (Treatment Plan Recorded), Subcutaneous, Once, Creig Hines, MD   daratumumab-hyaluronidase-fihj ([name]) 1800-30000 MG-UT/15ML chemo SQ injection 1,800 mg, 1,800 mg, Subcutaneous, Once, Creig Hines, MD  Physical exam:  Vitals:   04/28/23 0930  BP: 123/80  Pulse: 82  Resp: 18  Temp: 98.6 F (37 C)  TempSrc: Tympanic  SpO2: 100%  Weight: 171 lb 3.2 oz (77.7 kg)   Physical Exam Cardiovascular:     Rate and Rhythm: Normal rate and regular rhythm.     Heart sounds: Normal heart sounds.  Pulmonary:     Effort: Pulmonary effort is normal.     Breath sounds: Normal breath sounds.  Skin:    General: Skin is warm and dry.  Neurological:     Mental Status: She is alert and oriented to person, place, and time.         Latest Ref Rng & Units 04/28/2023    9:08 AM  CMP  Glucose 70 - 99 mg/dL 811   BUN 6 - 20 mg/dL 18   Creatinine 9.14 - 1.00 mg/dL 7.82   Sodium 956 - 213 mmol/L 135   Potassium 3.5 - 5.1 mmol/L 4.1   Chloride 98 - 111 mmol/L 104   CO2 22 - 32 mmol/L 23   Calcium 8.9 - 10.3 mg/dL 7.0   Total Protein 6.5 - 8.1 g/dL 6.7   Total Bilirubin <0.8 mg/dL 0.3   Alkaline Phos 38 - 126 U/L 96   AST 15 - 41 U/L 16   ALT 0 - 44 U/L 20       Latest Ref Rng & Units 04/28/2023    9:08 AM  CBC  WBC 4.0 - 10.5 K/uL 2.9   Hemoglobin 12.0 - 15.0 g/dL 9.4   Hematocrit 65.7 - 46.0 % 29.3   Platelets 150 - 400 K/uL 311     No images are attached to the encounter.  DG Chest 2 View  Result Date: 04/26/2023 CLINICAL DATA:  Follow up pleural effusion EXAM: CHEST - 2  VIEW COMPARISON:  X-ray 03/17/2023 and older. FINDINGS: Stable enlarged cardiopericardial silhouette. Decreasing pleural effusions bilaterally. Trace residual on the left. Decreasing lung base opacities and left midlung opacity. Some residual. No pneumothorax or edema. Significant fixation hardware along the thoracic spine. IMPRESSION: Decreasing pleural effusion.  Trace residual left. Decreasing bilateral lung opacities with some residual as well. Recommend continued follow up to confirm clearance. Electronically Signed   By: Karen Kays M.D.   On: 04/26/2023 12:02     Assessment and plan- Patient is a 57 y.o. female  with standard risk IgG lambda multiple myeloma R-ISS stage II.  She is here for on treatment assessment  prior to cycle 2-day 8 of Darzalex  Counts okay to proceed with cycle 2-day 8 of velcade and Darzalex today.  She also receives Velcade twice a week.  She will proceed with cycle 2-day 15 of Darzalex next week and I will see her back in 2 weeks for cycle 3-day 1 of Velcade and Darzalex.  Will repeat myeloma labs next week.  Overall numbers are trending down.  She is meeting with [facility] transplant team on 05/23/2023.  Continue with Decadron 20 mg once a week.  Patient has not called pharmacy ahead of time when she gets done with her Revlimid and therefore has not been on Revlimid for the last 3 to 4 days.  She will be receiving her prescription tomorrow which she will continue for this week and her off week will be next week.  Starting cycle 3 I will plan to increase Darzalex to either 15 or 20 mg based on her creatinine clearance.  Continue aspirin and acyclovir prophylaxis  Patient received Xgeva last week and will receive her next in 3 weeks  Neoplasm related pain: Continue as needed Dilaudid   Visit Diagnosis 1. Multiple myeloma not having achieved remission (HCC)   2. High risk medication use   3. Encounter for antineoplastic chemotherapy   4. Neoplasm related pain      Dr.  Owens Shark, MD, MPH [facility] [phone] 04/28/2023 11:00 AM

## 2023-04-28 NOTE — Patient Instructions (Signed)
[facility] - A DEPT OF [facility]  Discharge Instructions: Thank you for choosing [facility] to provide your oncology and hematology care.  If you have a lab appointment with the Cancer Center, please go directly to the Cancer Center and check in at the registration area.  Wear comfortable clothing and clothing appropriate for easy access to any Portacath or PICC line.   We strive to give you quality time with your provider. You may need to reschedule your appointment if you arrive late (15 or more minutes).  Arriving late affects you and other patients whose appointments are after yours.  Also, if you miss three or more appointments without notifying the office, you may be dismissed from the clinic at the provider's discretion.      For prescription refill requests, have your pharmacy contact our office and allow 72 hours for refills to be completed.    Today you received the following chemotherapy and/or immunotherapy agents Velcade /Faspro     To help prevent nausea and vomiting after your treatment, we encourage you to take your nausea medication as directed.  BELOW ARE SYMPTOMS THAT SHOULD BE REPORTED IMMEDIATELY: *FEVER GREATER THAN 100.4 F (38 C) OR HIGHER *CHILLS OR SWEATING *NAUSEA AND VOMITING THAT IS NOT CONTROLLED WITH YOUR NAUSEA MEDICATION *UNUSUAL SHORTNESS OF BREATH *UNUSUAL BRUISING OR BLEEDING *URINARY PROBLEMS (pain or burning when urinating, or frequent urination) *BOWEL PROBLEMS (unusual diarrhea, constipation, pain near the anus) TENDERNESS IN MOUTH AND THROAT WITH OR WITHOUT PRESENCE OF ULCERS (sore throat, sores in mouth, or a toothache) UNUSUAL RASH, SWELLING OR PAIN  UNUSUAL VAGINAL DISCHARGE OR ITCHING   Items with * indicate a potential emergency and should be followed up as soon as possible or go to the Emergency Department if any problems should occur.  Please show the CHEMOTHERAPY ALERT CARD or  IMMUNOTHERAPY ALERT CARD at check-in to the Emergency Department and triage nurse.  Should you have questions after your visit or need to cancel or reschedule your appointment, please contact [facility] - A DEPT OF [facility]  [phone] and follow the prompts.  Office hours are 8:00 a.m. to 4:30 p.m. Monday - Friday. Please note that voicemails left after 4:00 p.m. may not be returned until the following business day.  We are closed weekends and major holidays. You have access to a nurse at all times for urgent questions. Please call the main number to the clinic [phone] and follow the prompts.  For any non-urgent questions, you may also contact your provider using MyChart. We now offer e-Visits for anyone 48 and older to request care online for non-urgent symptoms. For details visit [email or web address].   Also download the MyChart app! Go to the app store, search "MyChart", open the app, select [facility], and log in with your MyChart username and password.

## 2023-04-29 ENCOUNTER — Telehealth: Payer: Self-pay | Admitting: *Deleted

## 2023-04-29 ENCOUNTER — Other Ambulatory Visit: Payer: Managed Care, Other (non HMO)

## 2023-04-29 NOTE — Telephone Encounter (Signed)
Patient case is being transferred to another N[facility] due to her having a bone marrow transplant. Morton Peters will be her new case manager and can be contacted at [phone] ext [phone] for any needs

## 2023-04-30 ENCOUNTER — Encounter: Payer: Self-pay | Admitting: Oncology

## 2023-04-30 LAB — VITAMIN B12: Vitamin B-12: 598 pg/mL (ref 200–1100)

## 2023-04-30 LAB — COMPLETE METABOLIC PANEL WITH GFR
AG Ratio: 1.1 (calc) (ref 1.0–2.5)
ALT: 16 U/L (ref 6–29)
AST: 14 U/L (ref 10–35)
Albumin: 3.3 g/dL — ABNORMAL LOW (ref 3.6–5.1)
Alkaline phosphatase (APISO): 103 U/L (ref 37–153)
BUN: 17 mg/dL (ref 7–25)
CO2: 23 mmol/L (ref 20–32)
Calcium: 6.8 mg/dL — ABNORMAL LOW (ref 8.6–10.4)
Chloride: 106 mmol/L (ref 98–110)
Creat: 0.91 mg/dL (ref 0.50–1.03)
Globulin: 3.1 g/dL (ref 1.9–3.7)
Glucose, Bld: 84 mg/dL (ref 65–99)
Potassium: 4 mmol/L (ref 3.5–5.3)
Sodium: 138 mmol/L (ref 135–146)
Total Bilirubin: 0.2 mg/dL (ref 0.2–1.2)
Total Protein: 6.4 g/dL (ref 6.1–8.1)
eGFR: 73 mL/min/{1.73_m2} (ref >=60)

## 2023-04-30 LAB — HEMOGLOBIN A1C
Hgb A1c MFr Bld: 5.1 %{Hb} (ref <5.7)
Mean Plasma Glucose: 100 mg/dL
eAG (mmol/L): 5.5 mmol/L

## 2023-04-30 LAB — LIPID PANEL
Cholesterol: 222 mg/dL — ABNORMAL HIGH (ref <200)
HDL: 88 mg/dL (ref >=50)
LDL Cholesterol (Calc): 115 mg/dL — ABNORMAL HIGH
Non-HDL Cholesterol (Calc): 134 mg/dL — ABNORMAL HIGH (ref <130)
Total CHOL/HDL Ratio: 2.5 (calc) (ref <5.0)
Triglycerides: 86 mg/dL (ref <150)

## 2023-04-30 LAB — T4, FREE: Free T4: 1.6 ng/dL (ref 0.8–1.8)

## 2023-04-30 LAB — TSH: TSH: 0.23 m[IU]/L — ABNORMAL LOW (ref 0.40–4.50)

## 2023-05-01 ENCOUNTER — Other Ambulatory Visit: Payer: Self-pay | Admitting: *Deleted

## 2023-05-01 ENCOUNTER — Inpatient Hospital Stay: Payer: Managed Care, Other (non HMO)

## 2023-05-01 VITALS — BP 177/97 | HR 73 | Temp 97.1°F | Resp 18

## 2023-05-01 DIAGNOSIS — Z5112 Encounter for antineoplastic immunotherapy: Secondary | ICD-10-CM | POA: Diagnosis not present

## 2023-05-01 DIAGNOSIS — C9000 Multiple myeloma not having achieved remission: Secondary | ICD-10-CM

## 2023-05-01 MED ORDER — BORTEZOMIB CHEMO SQ INJECTION 3.5 MG (2.5MG/ML)
1.3000 mg/m2 | Freq: Once | INTRAMUSCULAR | Status: AC
  Administered 2023-05-01: 2.25 mg via SUBCUTANEOUS
  Filled 2023-05-01: qty 0.9

## 2023-05-01 MED ORDER — PROCHLORPERAZINE MALEATE 10 MG PO TABS
10.0000 mg | ORAL_TABLET | Freq: Once | ORAL | Status: AC
  Administered 2023-05-01: 10 mg via ORAL
  Filled 2023-05-01: qty 1

## 2023-05-01 MED ORDER — HYDROMORPHONE HCL 4 MG PO TABS: 4.0000 mg | ORAL_TABLET | Freq: Four times a day (QID) | ORAL | 0 refills | Status: DC | PRN

## 2023-05-01 NOTE — Patient Instructions (Signed)
[facility] - A DEPT OF [facility]  Discharge Instructions: Thank you for choosing [facility] to provide your oncology and hematology care.  If you have a lab appointment with the Cancer Center, please go directly to the Cancer Center and check in at the registration area.  Wear comfortable clothing and clothing appropriate for easy access to any Portacath or PICC line.   We strive to give you quality time with your provider. You may need to reschedule your appointment if you arrive late (15 or more minutes).  Arriving late affects you and other patients whose appointments are after yours.  Also, if you miss three or more appointments without notifying the office, you may be dismissed from the clinic at the provider's discretion.      For prescription refill requests, have your pharmacy contact our office and allow 72 hours for refills to be completed.    Today you received the following chemotherapy and/or immunotherapy agents VELCADE       To help prevent nausea and vomiting after your treatment, we encourage you to take your nausea medication as directed.  BELOW ARE SYMPTOMS THAT SHOULD BE REPORTED IMMEDIATELY: *FEVER GREATER THAN 100.4 F (38 C) OR HIGHER *CHILLS OR SWEATING *NAUSEA AND VOMITING THAT IS NOT CONTROLLED WITH YOUR NAUSEA MEDICATION *UNUSUAL SHORTNESS OF BREATH *UNUSUAL BRUISING OR BLEEDING *URINARY PROBLEMS (pain or burning when urinating, or frequent urination) *BOWEL PROBLEMS (unusual diarrhea, constipation, pain near the anus) TENDERNESS IN MOUTH AND THROAT WITH OR WITHOUT PRESENCE OF ULCERS (sore throat, sores in mouth, or a toothache) UNUSUAL RASH, SWELLING OR PAIN  UNUSUAL VAGINAL DISCHARGE OR ITCHING   Items with * indicate a potential emergency and should be followed up as soon as possible or go to the Emergency Department if any problems should occur.  Please show the CHEMOTHERAPY ALERT CARD or IMMUNOTHERAPY  ALERT CARD at check-in to the Emergency Department and triage nurse.  Should you have questions after your visit or need to cancel or reschedule your appointment, please contact [facility] - A DEPT OF [facility]  [phone] and follow the prompts.  Office hours are 8:00 a.m. to 4:30 p.m. Monday - Friday. Please note that voicemails left after 4:00 p.m. may not be returned until the following business day.  We are closed weekends and major holidays. You have access to a nurse at all times for urgent questions. Please call the main number to the clinic [phone] and follow the prompts.  For any non-urgent questions, you may also contact your provider using MyChart. We now offer e-Visits for anyone 89 and older to request care online for non-urgent symptoms. For details visit [email or web address].   Also download the MyChart app! Go to the app store, search "MyChart", open the app, select [place], and log in with your MyChart username and password.  Bortezomib Injection What is this medication? BORTEZOMIB (bor TEZ oh mib) treats lymphoma. It may also be used to treat multiple myeloma, a type of bone marrow cancer. It works by blocking a protein that causes cancer cells to grow and multiply. This helps to slow or stop the spread of cancer cells. This medicine may be used for other purposes; ask your health care provider or pharmacist if you have questions. COMMON BRAND NAME(S): Velcade What should I tell my care team before I take this medication? They need to know if you have any of these conditions: Dehydration Diabetes  Heart disease Liver disease Tingling of the fingers or toes or other nerve disorder An unusual or allergic reaction to bortezomib, other medications, foods, dyes, or preservatives If you or your partner are pregnant or trying to get pregnant Breastfeeding How should I use this medication? This medication is injected into a vein or  under the skin. It is given by your care team in a hospital or clinic setting. Talk to your care team about the use of this medication in children. Special care may be needed. Overdosage: If you think you have taken too much of this medicine contact a poison control center or emergency room at once. NOTE: This medicine is only for you. Do not share this medicine with others. What if I miss a dose? Keep appointments for follow-up doses. It is important not to miss your dose. Call your care team if you are unable to keep an appointment. What may interact with this medication? Ketoconazole Rifampin This list may not describe all possible interactions. Give your health care provider a list of all the medicines, herbs, non-prescription drugs, or dietary supplements you use. Also tell them if you smoke, drink alcohol, or use illegal drugs. Some items may interact with your medicine. What should I watch for while using this medication? Your condition will be monitored carefully while you are receiving this medication. You may need blood work while taking this medication. This medication may affect your coordination, reaction time, or judgment. Do not drive or operate machinery until you know how this medication affects you. Sit up or stand slowly to reduce the risk of dizzy or fainting spells. Drinking alcohol with this medication can increase the risk of these side effects. This medication may increase your risk of getting an infection. Call your care team for advice if you get a fever, chills, sore throat, or other symptoms of a cold or flu. Do not treat yourself. Try to avoid being around people who are sick. Check with your care team if you have severe diarrhea, nausea, and vomiting, or if you sweat a lot. The loss of too much body fluid may make it dangerous for you to take this medication. Talk to your care team if you may be pregnant. Serious birth defects can occur if you take this medication during  pregnancy and for 7 months after the last dose. You will need a negative pregnancy test before starting this medication. Contraception is recommended while taking this medication and for 7 months after the last dose. Your care team can help you find the option that works for you. If your partner can get pregnant, use a condom during sex while taking this medication and for 4 months after the last dose. Do not breastfeed while taking this medication and for 2 months after the last dose. This medication may cause infertility. Talk to your care team if you are concerned about your fertility. What side effects may I notice from receiving this medication? Side effects that you should report to your care team as soon as possible: Allergic reactions--skin rash, itching, hives, swelling of the face, lips, tongue, or throat Bleeding--bloody or black, tar-like stools, vomiting blood or brown material that looks like coffee grounds, red or dark brown urine, small red or purple spots on skin, unusual bruising or bleeding Bleeding in the brain--severe headache, stiff neck, confusion, dizziness, change in vision, numbness or weakness of the face, arm, or leg, trouble speaking, trouble walking, vomiting Bowel blockage--stomach cramping, unable to have a bowel movement  or pass gas, loss of appetite, vomiting Heart failure--shortness of breath, swelling of the ankles, feet, or hands, sudden weight gain, unusual weakness or fatigue Infection--fever, chills, cough, sore throat, wounds that don't heal, pain or trouble when passing urine, general feeling of discomfort or being unwell Liver injury--right upper belly pain, loss of appetite, nausea, light-colored stool, dark yellow or brown urine, yellowing skin or eyes, unusual weakness or fatigue Low blood pressure--dizziness, feeling faint or lightheaded, blurry vision Lung injury--shortness of breath or trouble breathing, cough, spitting up blood, chest pain, fever Pain,  tingling, or numbness in the hands or feet Severe or prolonged diarrhea Stomach pain, bloody diarrhea, pale skin, unusual weakness or fatigue, decrease in the amount of urine, which may be signs of hemolytic uremic syndrome Sudden and severe headache, confusion, change in vision, seizures, which may be signs of posterior reversible encephalopathy syndrome (PRES) TTP--purple spots on the skin or inside the mouth, pale skin, yellowing skin or eyes, unusual weakness or fatigue, fever, fast or irregular heartbeat, confusion, change in vision, trouble speaking, trouble walking Tumor lysis syndrome (TLS)--nausea, vomiting, diarrhea, decrease in the amount of urine, dark urine, unusual weakness or fatigue, confusion, muscle pain or cramps, fast or irregular heartbeat, joint pain Side effects that usually do not require medical attention (report to your care team if they continue or are bothersome): Constipation Diarrhea Fatigue Loss of appetite Nausea This list may not describe all possible side effects. Call your doctor for medical advice about side effects. You may report side effects to FDA at 1-800-FDA-1088. Where should I keep my medication? This medication is given in a hospital or clinic. It will not be stored at home. NOTE: This sheet is a summary. It may not cover all possible information. If you have questions about this medicine, talk to your doctor, pharmacist, or health care provider.  2024 Elsevier/Gold Standard (2021-11-06 00:00:00)

## 2023-05-02 ENCOUNTER — Other Ambulatory Visit: Payer: Self-pay | Admitting: Oncology

## 2023-05-03 ENCOUNTER — Encounter: Payer: Self-pay | Admitting: Internal Medicine

## 2023-05-03 ENCOUNTER — Other Ambulatory Visit: Payer: Self-pay | Admitting: Internal Medicine

## 2023-05-03 MED ORDER — HYDROMORPHONE HCL 2 MG PO TABS: 4.0000 mg | ORAL_TABLET | Freq: Four times a day (QID) | ORAL | 0 refills | Status: DC | PRN

## 2023-05-03 NOTE — Progress Notes (Signed)
We need to send new script on Monday for 2 2 mg pills

## 2023-05-03 NOTE — Progress Notes (Signed)
Pt called that dialudid 4 mg not avilable at pharmacy. Called in a new script x 3 days- 2 mg- 2 pills every 6 h prn.   GB

## 2023-05-04 LAB — ACID FAST CULTURE WITH REFLEXED SENSITIVITIES (MYCOBACTERIA): Acid Fast Culture: NEGATIVE

## 2023-05-05 ENCOUNTER — Other Ambulatory Visit: Payer: Self-pay | Admitting: *Deleted

## 2023-05-05 ENCOUNTER — Other Ambulatory Visit: Payer: Self-pay | Admitting: Internal Medicine

## 2023-05-05 ENCOUNTER — Inpatient Hospital Stay: Payer: Managed Care, Other (non HMO)

## 2023-05-05 ENCOUNTER — Encounter: Payer: Self-pay | Admitting: Oncology

## 2023-05-05 ENCOUNTER — Ambulatory Visit (INDEPENDENT_AMBULATORY_CARE_PROVIDER_SITE_OTHER): Payer: Managed Care, Other (non HMO) | Admitting: Family Medicine

## 2023-05-05 ENCOUNTER — Other Ambulatory Visit: Payer: Self-pay | Admitting: Pharmacist

## 2023-05-05 ENCOUNTER — Encounter: Payer: Self-pay | Admitting: Family Medicine

## 2023-05-05 VITALS — BP 124/82 | Ht 62.0 in | Wt 170.0 lb

## 2023-05-05 VITALS — BP 141/74 | HR 78 | Temp 98.3°F | Resp 20

## 2023-05-05 DIAGNOSIS — F331 Major depressive disorder, recurrent, moderate: Secondary | ICD-10-CM | POA: Diagnosis not present

## 2023-05-05 DIAGNOSIS — I10 Essential (primary) hypertension: Secondary | ICD-10-CM

## 2023-05-05 DIAGNOSIS — R7303 Prediabetes: Secondary | ICD-10-CM | POA: Diagnosis not present

## 2023-05-05 DIAGNOSIS — E039 Hypothyroidism, unspecified: Secondary | ICD-10-CM

## 2023-05-05 DIAGNOSIS — C9000 Multiple myeloma not having achieved remission: Secondary | ICD-10-CM

## 2023-05-05 DIAGNOSIS — F419 Anxiety disorder, unspecified: Secondary | ICD-10-CM

## 2023-05-05 DIAGNOSIS — Z5112 Encounter for antineoplastic immunotherapy: Secondary | ICD-10-CM | POA: Diagnosis not present

## 2023-05-05 DIAGNOSIS — D472 Monoclonal gammopathy: Secondary | ICD-10-CM

## 2023-05-05 LAB — CBC WITH DIFFERENTIAL (CANCER CENTER ONLY)
Abs Immature Granulocytes: 0.05 10*3/uL (ref 0.00–0.07)
Basophils Absolute: 0.1 10*3/uL (ref 0.0–0.1)
Basophils Relative: 2 %
Eosinophils Absolute: 0.2 10*3/uL (ref 0.0–0.5)
Eosinophils Relative: 5 %
HCT: 31.1 % — ABNORMAL LOW (ref 36.0–46.0)
Hemoglobin: 9.9 g/dL — ABNORMAL LOW (ref 12.0–15.0)
Immature Granulocytes: 1 %
Lymphocytes Relative: 20 %
Lymphs Abs: 0.8 10*3/uL (ref 0.7–4.0)
MCH: 29.5 pg (ref 26.0–34.0)
MCHC: 31.8 g/dL (ref 30.0–36.0)
MCV: 92.6 fL (ref 80.0–100.0)
Monocytes Absolute: 0.5 10*3/uL (ref 0.1–1.0)
Monocytes Relative: 13 %
Neutro Abs: 2.4 10*3/uL (ref 1.7–7.7)
Neutrophils Relative %: 59 %
Platelet Count: 202 10*3/uL (ref 150–400)
RBC: 3.36 MIL/uL — ABNORMAL LOW (ref 3.87–5.11)
RDW: 15.6 % — ABNORMAL HIGH (ref 11.5–15.5)
WBC Count: 4.1 10*3/uL (ref 4.0–10.5)
nRBC: 0 % (ref 0.0–0.2)

## 2023-05-05 MED ORDER — DEXAMETHASONE 4 MG PO TABS
20.0000 mg | ORAL_TABLET | Freq: Once | ORAL | Status: AC
  Administered 2023-05-05: 20 mg via ORAL
  Filled 2023-05-05: qty 5

## 2023-05-05 MED ORDER — DARATUMUMAB-HYALURONIDASE-FIHJ 1800-30000 MG-UT/15ML ~~LOC~~ SOLN
1800.0000 mg | Freq: Once | SUBCUTANEOUS | Status: AC
  Administered 2023-05-05: 1800 mg via SUBCUTANEOUS
  Filled 2023-05-05: qty 15

## 2023-05-05 MED ORDER — ACETAMINOPHEN 325 MG PO TABS
650.0000 mg | ORAL_TABLET | Freq: Once | ORAL | Status: AC
  Administered 2023-05-05: 650 mg via ORAL
  Filled 2023-05-05: qty 2

## 2023-05-05 MED ORDER — HYDROMORPHONE HCL 2 MG PO TABS: 4.0000 mg | ORAL_TABLET | Freq: Four times a day (QID) | ORAL | 0 refills | Status: DC | PRN

## 2023-05-05 MED ORDER — DIPHENHYDRAMINE HCL 25 MG PO CAPS
50.0000 mg | ORAL_CAPSULE | Freq: Once | ORAL | Status: AC
  Administered 2023-05-05: 50 mg via ORAL
  Filled 2023-05-05: qty 2

## 2023-05-05 MED ORDER — LENALIDOMIDE 20 MG PO CAPS: 20.0000 mg | ORAL_CAPSULE | Freq: Every day | ORAL | 0 refills | Status: DC

## 2023-05-05 NOTE — Telephone Encounter (Signed)
MD wanting to dose increase patient's lenalidomide to 20mg . New Rx sent to [facility]. Patient notified.

## 2023-05-05 NOTE — Patient Instructions (Signed)
[facility] - A [facility]  Discharge Instructions: Thank you for choosing [facility] to provide your oncology and hematology care.  If you have a lab appointment with the Cancer Center, please go directly to the Cancer Center and check in at the registration area.  Wear comfortable clothing and clothing appropriate for easy access to any Portacath or PICC line.   We strive to give you quality time with your provider. You may need to reschedule your appointment if you arrive late (15 or more minutes).  Arriving late affects you and other patients whose appointments are after yours.  Also, if you miss three or more appointments without notifying the office, you may be dismissed from the clinic at the provider's discretion.      For prescription refill requests, have your pharmacy contact our office and allow 72 hours for refills to be completed.    Today you received the following chemotherapy and/or immunotherapy agents darzalex faspro      To help prevent nausea and vomiting after your treatment, we encourage you to take your nausea medication as directed.  BELOW ARE SYMPTOMS THAT SHOULD BE REPORTED IMMEDIATELY: *FEVER GREATER THAN 100.4 F (38 C) OR HIGHER *CHILLS OR SWEATING *NAUSEA AND VOMITING THAT IS NOT CONTROLLED WITH YOUR NAUSEA MEDICATION *UNUSUAL SHORTNESS OF BREATH *UNUSUAL BRUISING OR BLEEDING *URINARY PROBLEMS (pain or burning when urinating, or frequent urination) *BOWEL PROBLEMS (unusual diarrhea, constipation, pain near the anus) TENDERNESS IN MOUTH AND THROAT WITH OR WITHOUT PRESENCE OF ULCERS (sore throat, sores in mouth, or a toothache) UNUSUAL RASH, SWELLING OR PAIN  UNUSUAL VAGINAL DISCHARGE OR ITCHING   Items with * indicate a potential emergency and should be followed up as soon as possible or go to the Emergency Department if any problems should occur.  Please show the CHEMOTHERAPY ALERT CARD or  IMMUNOTHERAPY ALERT CARD at check-in to the Emergency Department and triage nurse.  Should you have questions after your visit or need to cancel or reschedule your appointment, please contact [facility] - A DEPT OF [facility]  [phone] and follow the prompts.  Office hours are 8:00 a.m. to 4:30 p.m. Monday - Friday. Please note that voicemails left after 4:00 p.m. may not be returned until the following business day.  We are closed weekends and major holidays. You have access to a nurse at all times for urgent questions. Please call the main number to the clinic [phone] and follow the prompts.  For any non-urgent questions, you may also contact your provider using MyChart. We now offer e-Visits for anyone 30 and older to request care online for non-urgent symptoms. For details visit [email or web address].   Also download the MyChart app! Go to the app store, search "MyChart", open the app, select [place], and log in with your MyChart username and password.

## 2023-05-05 NOTE — Patient Instructions (Addendum)
Thank you for coming to the office today.  Recommend OTC Vitamin D3 2,000 unit daily. We can repeat lab in the future.  Cholesterol only mild elevated. 115 LDL No cholesterol medication required  A1c is normal. No need for treatment of blood sugar today. This could be altered by the blood transfusions, if within 3 months.   Please schedule a Follow-up Appointment to: Return if symptoms worsen or fail to improve.  If you have any other questions or concerns, please feel free to call the office or send a message through MyChart. You may also schedule an earlier appointment if necessary.  Additionally, you may be receiving a survey about your experience at our office within a few days to 1 week by e-mail or mail. We value your feedback.  Saralyn Pilar, DO [facility], [place]

## 2023-05-05 NOTE — Progress Notes (Signed)
Subjective:    Patient ID: Paula Garrett, female    DOB: September 15, 1965, 57 y.o.   MRN: [id number]  Paula Garrett is a 57 y.o. female presenting on 05/05/2023 for Annual Exam   HPI  Discussed the use of AI scribe software for clinical note transcription with the patient, who gave verbal consent to proceed.  CHRONIC HTN: Reports that her blood pressure has been well-controlled on her current regimen of amlodipine. Current Meds - amlodipine 10mg  daily   Reports good compliance, took meds today. Tolerating well, w/o complaints. 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 She is taking prophylactic medication to prevent shingles, a potential side effect of her chemotherapy. She also reports a need for a COVID booster and is considering a shingles vaccine once she is off the prophylactic medication.  Major Depression Anxiety The patient is also on Klonopin, Ambien, and Lexapro, prescribed by her psychiatrist, and she expresses a desire for her psychiatrist to continue managing these medications. She also mentions taking a high-dose vitamin D supplement in the past and expresses a willingness to switch to a lower daily dose.  Psychiatry Managing her mental health medications - lexapro, ambien, klonopin  Hyperlipidemia / LDL Elevated Cholesterol - Reports no concerns. Last lipid panel 04/2023, LDL 115 Not on statin therapy  A1c currently improved. But may be falsely low due to prior blood transfusion.   Health Maintenance: COVID booster at pharmacy  On Shingles prophylaxis treatment with medication due to chemo. We can consider injection in future.     05/05/2023    3:20 PM  03/04/2023    4:06 PM 01/01/2023    8:50 AM  Depression screen PHQ 2/9  Decreased Interest 1 3 1   Down, Depressed, Hopeless 1 3 1   PHQ - 2 Score 2 6 2   Altered sleeping 1 3 1   Tired, decreased energy 1 3 1   Change in appetite 1 3 2   Feeling bad or failure about yourself  1 3 1   Trouble concentrating 1 3 2   Moving slowly or fidgety/restless 0 3 3  Suicidal thoughts 0 0 0  PHQ-9 Score 7 24 12   Difficult doing work/chores Somewhat difficult  Somewhat difficult       05/05/2023    3:20 PM 01/01/2023    8:50 AM 11/20/2022   11:23 AM 11/01/2022    1:10 PM  GAD 7 : Generalized Anxiety Score  Nervous, Anxious, on Edge 1 1 3 3   Control/stop worrying 1 1 3 2   Worry too much - different things 1 1 3 2   Trouble relaxing 1 1 3 2   Restless 1 0 1 2  Easily annoyed or irritable 1 1 1 2   Afraid - awful might happen 3 2 1 2   Total GAD 7 Score 9 7 15 15   Anxiety Difficulty Somewhat difficult  Somewhat difficult Somewhat difficult    Social History   Tobacco Use   Smoking status: Former    Types: Cigarettes   Smokeless tobacco: Never   Tobacco comments:    Quit smoking 02/16/2023  Vaping Use   Vaping status: Never Used  Substance Use Topics   Alcohol use: Yes    Alcohol/week: 10.0 - 12.0 standard drinks of alcohol    Types: 8 - 10  Glasses of wine, 2 Standard drinks or equivalent per week   Drug use: No    Review of Systems Per HPI unless specifically indicated above     Objective:    BP 124/82   Ht 5\' 2"  (1.575 m)   Wt 170 lb (77.1 kg)   BMI 31.09 kg/m   Wt Readings from Last 3 Encounters:  05/05/23 170 lb (77.1 kg)  04/28/23 171 lb 3.2 oz (77.7 kg)  04/24/23 166 lb 0.1 oz (75.3 kg)    Physical Exam Vitals and nursing note reviewed.  Constitutional:      General: She is not in acute distress.    Appearance: Normal appearance. She is well-developed. She is not diaphoretic.     Comments: Well-appearing, comfortable, cooperative  HENT:     Head: Normocephalic and  atraumatic.  Eyes:     General:        Right eye: No discharge.        Left eye: No discharge.     Conjunctiva/sclera: Conjunctivae normal.  Cardiovascular:     Rate and Rhythm: Normal rate.  Pulmonary:     Effort: Pulmonary effort is normal.  Skin:    General: Skin is warm and dry.     Findings: No erythema or rash.  Neurological:     Mental Status: She is alert and oriented to person, place, and time.  Psychiatric:        Mood and Affect: Mood normal.        Behavior: Behavior normal.        Thought Content: Thought content normal.     Comments: Well groomed, good eye contact, normal speech and thoughts     Results for orders placed or performed in visit on 05/05/23  CBC with Differential (Cancer Center Only)  Result Value Ref Range   WBC Count 4.1 4.0 - 10.5 K/uL   RBC 3.36 (L) 3.87 - 5.11 MIL/uL   Hemoglobin 9.9 (L) 12.0 - 15.0 g/dL   HCT 16.1 (L) 09.6 - 04.5 %   MCV 92.6 80.0 - 100.0 fL   MCH 29.5 26.0 - 34.0 pg   MCHC 31.8 30.0 - 36.0 g/dL   RDW 40.9 (H) 81.1 - 91.4 %   Platelet Count 202 150 - 400 K/uL   nRBC 0.0 0.0 - 0.2 %   Neutrophils Relative % 59 %   Neutro Abs 2.4 1.7 - 7.7 K/uL   Lymphocytes Relative 20 %   Lymphs Abs 0.8 0.7 - 4.0 K/uL   Monocytes Relative 13 %   Monocytes Absolute 0.5 0.1 - 1.0 K/uL   Eosinophils Relative 5 %   Eosinophils Absolute 0.2 0.0 - 0.5 K/uL   Basophils Relative 2 %   Basophils Absolute 0.1 0.0 - 0.1 K/uL   Immature Granulocytes 1 %   Abs Immature Granulocytes 0.05 0.00 - 0.07 K/uL      Assessment & Plan:   Problem List Items Addressed This Visit     Anxiety   Benign essential hypertension - Primary   Hypothyroidism   MDD (major depressive disorder), recurrent episode, moderate (HCC)   Prediabetes   Smoldering multiple myeloma    Smoldering multiple myeloma Follow w/ [facility].  Hypertension Well controlled with Amlodipine 10mg  daily -Continue current regimen.  Hyperlipidemia LDL mildly  elevated at 115, but within acceptable range given the patient's cardiovascular risk score. -No need for cholesterol medication at this time.  Diabetes A1c normalized to A1c 5.1, however question if  blood transfusions skewed results may be falsely low -Monitor A1c going forward.  Vitamin D deficiency Patient was previously on high dose Vitamin D, but currently not taking it. -Recommend over-the-counter Vitamin D3 2000 units daily.  General Health Maintenance -Plan for COVID booster and shingles vaccine once off preventative medication. -Continue current psychiatric medications as managed by psychiatrist. -Continue monitoring thyroid function with specialist. -Follow-up as needed, particularly in relation to potential transplant.         No orders of the defined types were placed in this encounter.   No orders of the defined types were placed in this encounter.   Follow up plan: Return if symptoms worsen or fail to improve.  Saralyn Pilar, DO [facility] 05/05/2023, 3:13 PM

## 2023-05-06 LAB — KAPPA/LAMBDA LIGHT CHAINS
Kappa free light chain: 27.3 mg/L — ABNORMAL HIGH (ref 3.3–19.4)
Kappa, lambda light chain ratio: 1.41 (ref 0.26–1.65)
Lambda free light chains: 19.3 mg/L (ref 5.7–26.3)

## 2023-05-07 ENCOUNTER — Encounter: Payer: Self-pay | Admitting: Student in an Organized Health Care Education/Training Program

## 2023-05-07 ENCOUNTER — Ambulatory Visit (INDEPENDENT_AMBULATORY_CARE_PROVIDER_SITE_OTHER): Payer: Managed Care, Other (non HMO) | Admitting: Student in an Organized Health Care Education/Training Program

## 2023-05-07 VITALS — BP 136/86 | HR 87 | Temp 97.9°F | Ht 62.0 in | Wt 172.2 lb

## 2023-05-07 DIAGNOSIS — C9000 Multiple myeloma not having achieved remission: Secondary | ICD-10-CM | POA: Diagnosis not present

## 2023-05-07 DIAGNOSIS — J15212 Pneumonia due to Methicillin resistant Staphylococcus aureus: Secondary | ICD-10-CM

## 2023-05-07 DIAGNOSIS — F332 Major depressive disorder, recurrent severe without psychotic features: Secondary | ICD-10-CM | POA: Diagnosis not present

## 2023-05-07 DIAGNOSIS — F17200 Nicotine dependence, unspecified, uncomplicated: Secondary | ICD-10-CM | POA: Diagnosis not present

## 2023-05-07 DIAGNOSIS — J90 Pleural effusion, not elsewhere classified: Secondary | ICD-10-CM | POA: Diagnosis not present

## 2023-05-07 DIAGNOSIS — R0602 Shortness of breath: Secondary | ICD-10-CM

## 2023-05-07 DIAGNOSIS — F411 Generalized anxiety disorder: Secondary | ICD-10-CM | POA: Diagnosis not present

## 2023-05-07 NOTE — Progress Notes (Signed)
Assessment & Plan:   1. Shortness of breath 2. Multiple myeloma 3. Left sided pleural effusion - resolved   Patient initially seen for shortness of breath, cough, and wheeze that was concerning for Asthma vs COPD based on symptoms. She is still pending PFT's and was empirically initiated on Breztri with improvement in symptoms. She is presenting today for follow up.  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  She was noted to have a left-sided pleural effusion. Pleural fluid analysis noted an exudate with lymphocyte predominance. Cholesterol was elevated at 84 mg/dL, LDL at 74 U/L and triglycerides normal at 24 mg/dL. Given she was admitted with concern for pneumonia, the differential for this pleural effusion included a parapneumonic effusion, an effusion associated with the plasmacytoma, and less likely an effusion secondary to a CSF leak given recent surgery. She underwent thoracentesis and subsequently chest tube placement with drainage and improvement. Respiratory cultures while inpatient grew MRSA (sputum) and candida (Bronchoscopy).  Follow-up imaging has since shown resolution of the pleural effusion and no recurrence.  I will order repeat CT scan to confirm resolution of previously noted findings.  I suspect that the effusion was most likely driven by the plasmacytoma which is now improved following radiation and chemotherapy for disease control.  Today, she continues to feel better from a respiratory perspective.  She has discontinued her oxygen and has not underwent overnight pulse oximetry nor pulmonary function testing.  She reports no hypoxia on self measurement of SpO2.  She continues to use her Breztri inhaler twice daily as prescribed and reports significant improvement with it.   - Amb referral for DME -discontinue oxygen therapy - CT CHEST WO CONTRAST;  Future  Return in about 6 months (around 11/04/2023).  I spent 30 minutes caring for this patient today, including preparing to see the patient, obtaining a medical history , reviewing a separately obtained history, performing a medically appropriate examination and/or evaluation, counseling and educating the patient/family/caregiver, ordering medications, tests, or procedures, documenting clinical information in the electronic health record, and independently interpreting results (not separately reported/billed) and communicating results to the patient/family/caregiver  Raechel Chute, MD [facility] 05/07/2023 3:23 PM    End of visit medications:  No orders of the defined types were placed in this encounter.    Current Outpatient Medications:    acyclovir (ZOVIRAX) 400 MG tablet, Take 1 tablet (400 mg total) by mouth 2 (two) times daily., Disp: 60 tablet, Rfl: 5   amLODipine (NORVASC) 10 MG tablet, Take 1 tablet (10 mg total) by mouth daily., Disp: 90 tablet, Rfl: 3   aspirin EC 81 MG tablet, Take 1 tablet (81 mg total) by mouth daily., Disp: 100 tablet, Rfl: 3   butalbital-acetaminophen-caffeine (FIORICET) 50-325-40 MG tablet, TAKE 1 TABLET BY MOUTH EVERY 4 HOURS AS NEEDED FOR HEADACHE, Disp: 14 tablet, Rfl: 3   Cholecalciferol (VITAMIN D3) 50 MCG (2000 UT) CAPS, Take by mouth., Disp: , Rfl:    clonazePAM (KLONOPIN) 1 MG tablet, TAKE 1 TABLET(1 MG) BY MOUTH TWICE DAILY, Disp: 60 tablet, Rfl: 2   cyanocobalamin (VITAMIN B12) 1000 MCG/ML injection, INJECT 1 ML IN THE MUSCLE EVERY 30 DAYS, Disp: 4 mL, Rfl: 12   cyclobenzaprine (FLEXERIL) 10 MG tablet, Take 0.5-1 tablets (5-10 mg total) by mouth 3 (three) times daily as needed for muscle spasms., Disp: 60 tablet, Rfl: 1  dexamethasone (DECADRON) 4 MG tablet, Take 5 tabs (20 mg) weekly the day after daratumumab for 12 weeks. Take with breakfast., Disp: 20 tablet, Rfl: 5   docusate sodium (COLACE) 100 MG capsule, Take 1  capsule (100 mg total) by mouth 2 (two) times daily., Disp: 10 capsule, Rfl: 0   escitalopram (LEXAPRO) 10 MG tablet, Take 1 tablet (10 mg total) by mouth daily., Disp: 30 tablet, Rfl: 2   HYDROmorphone (DILAUDID) 2 MG tablet, Take 2 tablets (4 mg total) by mouth every 6 (six) hours as needed for severe pain (pain score 7-10)., Disp: 24 tablet, Rfl: 0   ipratropium-albuterol (DUONEB) 0.5-2.5 (3) MG/3ML SOLN, Take 3 mLs by nebulization 4 (four) times daily., Disp: 360 mL, Rfl: 0   lenalidomide (REVLIMID) 20 MG capsule, Take 1 capsule (20 mg total) by mouth daily. Take for 14 days, then hold for 7 days. Repeat every 21 days., Disp: 14 capsule, Rfl: 0   levothyroxine (SYNTHROID) 125 MCG tablet, Take 2 tablets (250 mcg total) by mouth daily before breakfast., Disp: 60 tablet, Rfl: 0   lidocaine-prilocaine (EMLA) cream, Apply 1 Application topically once., Disp: , Rfl:    ondansetron (ZOFRAN) 8 MG tablet, Take 1 tablet (8 mg total) by mouth every 8 (eight) hours as needed for nausea or vomiting., Disp: 30 tablet, Rfl: 1   pantoprazole (PROTONIX) 40 MG tablet, 30 min before lunch or dinner, Disp: 90 tablet, Rfl: 3   polyethylene glycol (MIRALAX / GLYCOLAX) 17 g packet, Take 17 g by mouth 2 (two) times daily., Disp: 14 each, Rfl: 0   prochlorperazine (COMPAZINE) 10 MG tablet, Take 1 tablet (10 mg total) by mouth every 6 (six) hours as needed for nausea or vomiting., Disp: 30 tablet, Rfl: 1   senna-docusate (SENOKOT-S) 8.6-50 MG tablet, Take 1 tablet by mouth 2 (two) times daily., Disp: 30 tablet, Rfl: 0   Spacer/Aero-Holding Chambers (AEROCHAMBER MV) inhaler, Use as instructed, Disp: 1 each, Rfl: 0   SYRINGE-NEEDLE, DISP, 3 ML 25G X 1-1/2" 3 ML MISC, 1 Device by Does not apply route every 30 (thirty) days. With B12 shot, Disp: 12 each, Rfl: 1   thiamine (VITAMIN B-1) 100 MG tablet, Take 1 tablet (100 mg total) by mouth daily., Disp: 30 tablet, Rfl: 0   zolpidem (AMBIEN) 10 MG tablet, TAKE 1/2 TO 1 TABLET(5  TO 10 MG) BY MOUTH AT BEDTIME AS NEEDED FOR SLEEP, Disp: 30 tablet, Rfl: 2   Subjective:   PATIENT ID: Paula Garrett GENDER: female DOB: 16-Nov-1965, MRN: [id number]  Chief Complaint  Patient presents with   Follow-up    No cough, shortness of breath or wheezing.     HPI  Ms. Nicklow is a pleasant 57 year old female presenting to clinic today for follow-up.  Since our last visit in October, she felt much better with near total resolution of her symptoms.  She no longer has shortness of breath nor cough.  She denies any chest pain or chest tightness, and does not have wheezing.  She is overall anxious about her diagnosis of multiple myeloma.  She is meeting with the [facility] transplant team next month to discuss bone marrow transplant.  She has had a chest x-ray as well as an x-ray of the spine both of which appear reassuring with improvement in her pleural effusion.  She has not used her oxygen for months (neither during the day nor nocturnally).  She continues to be compliant with Breztri.  Patient carries a diagnosis of IgG lambda multiple  myeloma followed closely by Dr. Elsie Lincoln.  She is starting cycle 2 of chemotherapy (D-RVD) after having finished radiation treatment to her spine.   During our initial visit, she'd reported a 6 month history of exertional dyspnea, cough, wheeze, and sputum production. The cough was productive of whitish sputum. She reported a wheeze and a rattling in her chest without any chest pain or tightness.   Following that visit, she presented to the ED with the chief complaint of chest discomfort and chest pain that was described as pleuritic. CXR showed a pleural based nodular opacity in the left hemithorax, and follow up chest CT showed a soft tissue mass at T6-T7 with destruction of the 7th rib and invasion of T6-T7 vertebral bodies and with spinal canal involvement. She was admitted to the hospital and seen by oncology and neurosurgery. She underwent spinal stabilization  surgery on 01/28/2023 and the mass diagnosed as a plasmacytoma and diagnosis of multiple myeloma made. No pleural effusion was noted at the time of that admission.   Patient subsequently followed by oncology with plan to initiate chemotherapy.This was complicated by hyponatremia again requiring admission to the hospital. Imaging during said admission is notable for the development of left sided pleural effusion. Patient then had two admissions to the hospital for shortness of breath, found to have the left sided pleural effusion. She initially had a thoracentesis, with re-accumulation, and subsequent placement of a chest tube. Pleural fluid was noted to be exudative, and predominantly lymphocytic. POCUS was also noted for a right sided consolidation. Patient was initiated on broad spectrum antibiotics with sputum culture growing MRSA. Bronchoscopy was performed while she was in the hospital and respiratory cultures obtained grew candida (1000 colonies). Chest tube was discontinued and her symptoms improved and she was discharged with plans for outpatient follow up.  She has a long standing history of smoking, and has smoked a pack a day for over 30 years. She works as a Industrial/product designer. She doesn't have any other occupational exposures and denies illicit drug use.   Ancillary information including prior medications, full medical/surgical/family/social histories, and PFTs (when available) are listed below and have been reviewed.   Review of Systems  Constitutional:  Negative for chills, fever, malaise/fatigue and weight loss.  Respiratory:  Negative for cough, hemoptysis, sputum production, shortness of breath and wheezing.   Cardiovascular:  Negative for chest pain.  Skin:  Negative for rash.     Objective:   Vitals:   05/07/23 1508  BP: 136/86  Pulse: 87  Temp: 97.9 F (36.6 C)  TempSrc: Temporal  SpO2: 98%  Weight: 172 lb 3.2 oz (78.1 kg)  Height: 5\' 2"  (1.575 m)   98% on RA  BMI  Readings from Last 3 Encounters:  05/07/23 31.50 kg/m  05/05/23 31.09 kg/m  04/28/23 31.31 kg/m   Wt Readings from Last 3 Encounters:  05/07/23 172 lb 3.2 oz (78.1 kg)  05/05/23 170 lb (77.1 kg)  04/28/23 171 lb 3.2 oz (77.7 kg)    Physical Exam Constitutional:      Appearance: Normal appearance. She is obese.  Eyes:     Pupils: Pupils are equal, round, and reactive to light.  Cardiovascular:     Rate and Rhythm: Normal rate and regular rhythm.  Pulmonary:     Effort: Pulmonary effort is normal.     Breath sounds: Normal breath sounds. No stridor. No wheezing or rales.  Neurological:     General: No focal deficit present.  Mental Status: She is alert and oriented to Garrett, place, and time. Mental status is at baseline.       Ancillary Information    Past Medical History:  Diagnosis Date   Anxiety    Arthritis    Asthma    Barrett's esophagus 2015   COVID-19    03/10/20   Depression    GERD (gastroesophageal reflux disease)    Hypertension    Hypothyroidism    Pneumonia    Smoldering myeloma    Thyroid disease      Family History  Problem Relation Age of Onset   Heart disease Mother    COPD Mother    Arthritis Mother    Cancer Mother        uterine   Lupus Mother    Anxiety disorder Mother    Depression Mother    Drug abuse Mother    COPD Father    Arthritis Father    Heart disease Father    Heart attack Father    Alcohol abuse Father    Heart disease Brother    Heart attack Brother    Drug abuse Brother    Anxiety disorder Brother    Depression Brother    Heart disease Brother    Cancer Brother        liver cancer age 45 y.o   Diabetes Maternal Grandmother    Diabetes Maternal Grandfather    Diabetes Paternal Grandmother    Diabetes Paternal Grandfather    Breast cancer Neg Hx      Past Surgical History:  Procedure Laterality Date   ABLATION  2006   APPLICATION OF INTRAOPERATIVE CT SCAN N/A 01/28/2023   Procedure: APPLICATION OF  INTRAOPERATIVE CT SCAN;  Surgeon: Loreen Freud, MD;  Location: [facility] ORS;  Service: Neurosurgery;  Laterality: N/A;   BREAST CYST ASPIRATION Left 1998   BREAST SURGERY     cyst aspiration   BREAST SURGERY  1998   cyst aspiration   COLONOSCOPY  03/2014   COLONOSCOPY WITH PROPOFOL N/A 01/06/2020   Procedure: COLONOSCOPY WITH PROPOFOL;  Surgeon: Toledo, Boykin Nearing, MD;  Location: [facility] ENDOSCOPY;  Service: Gastroenterology;  Laterality: N/A;   ENDOMETRIAL ABLATION     ESOPHAGOGASTRODUODENOSCOPY (EGD) WITH PROPOFOL N/A 08/15/2015   Procedure: ESOPHAGOGASTRODUODENOSCOPY (EGD) WITH PROPOFOL;  Surgeon: Christena Deem, MD;  Location: [facility] ENDOSCOPY;  Service: Endoscopy;  Laterality: N/A;   ESOPHAGOGASTRODUODENOSCOPY (EGD) WITH PROPOFOL N/A 01/22/2016   Procedure: ESOPHAGOGASTRODUODENOSCOPY (EGD) WITH PROPOFOL;  Surgeon: Christena Deem, MD;  Location: [facility] END[facility]COPY;  Service: Endoscopy;  Laterality: N/A;   ESOPHAGOGASTRODUODENOSCOPY (EGD) WITH PROPOFOL N/A 01/06/2020   Procedure: ESOPHAGOGASTRODUODENOSCOPY (EGD) WITH PROPOFOL;  Surgeon: Toledo, Boykin Nearing, MD;  Location: [facility] ENDOSCOPY;  Service: Gastroenterology;  Laterality: N/A;   FLEXIBLE BRONCHOSCOPY Bilateral 03/13/2023   Procedure: FLEXIBLE BRONCHOSCOPY;  Surgeon: Erin Fulling, MD;  Location: [facility] ORS;  Service: Cardiopulmonary;  Laterality: Bilateral;   GASTRIC BYPASS  2005   HERNIA REPAIR     IR BONE MARROW BIOPSY & ASPIRATION  02/05/2023   NASAL SINUS SURGERY     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   TOTAL THYROIDECTOMY     TUBAL LIGATION      Social History   Socioeconomic History   Marital status: Divorced    Spouse name: Not on file   Number of children: 2   Years of education: Not on file   Highest education level: GED or equivalent  Occupational History  Occupation: Part-time  Tobacco Use   Smoking status: Former    Types: Cigarettes   Smokeless tobacco: Never   Tobacco comments:    Quit smoking 02/16/2023   Vaping Use   Vaping status: Never Used  Substance and Sexual Activity   Alcohol use: Yes    Alcohol/week: 10.0 - 12.0 standard drinks of alcohol    Types: 8 - 10 Glasses of wine, 2 Standard drinks or equivalent per week   Drug use: No   Sexual activity: Yes    Partners: Male  Other Topics Concern   Not on file  Social History Narrative   Lives in [place] single, divorced       Work - 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       Diet - healthy diet   Exercise - limited   Caffeine use: daily   Social Determinants of Health   Financial Resource Strain: Low Risk  (11/01/2022)   Overall Financial Resource Strain (CARDIA)    Difficulty of Paying Living Expenses: Not very hard  Food Insecurity: No Food Insecurity (03/18/2023)   Hunger Vital Sign    Worried About Running Out of Food in the Last Year: Never true    Ran Out of Food in the Last Year: Never true  Transportation Needs: No Transportation Needs (03/18/2023)   PRAPARE - Administrator, Civil Service (Medical): No    Lack of Transportation (Non-Medical): No  Physical Activity: Insufficiently Active (11/01/2022)   Exercise Vital Sign    Days of Exercise per Week: 3 days    Minutes of Exercise per Session: 20 min  Stress: Stress Concern Present (03/04/2023)   [employer] of Occupational Health - Occupational Stress Questionnaire    Feeling of Stress : Very much  Social Connections: Socially Isolated (03/04/2023)   Social Connection and Isolation Panel [NHANES]    Frequency of Communication with Friends and Family: Never    Frequency of Social Gatherings with Friends and Family: Never    Attends Religious Services: Never    Database administrator or Organizations: No    Attends Banker Meetings: Never    Marital Status: Divorced  Catering manager Violence: Not At Risk (03/06/2023)   Humiliation, Afraid, Rape, and Kick questionnaire    Fear of Current or Ex-Partner: No    Emotionally Abused: No     Physically Abused: No    Sexually Abused: No     Allergies  Allergen Reactions   Hctz [Hydrochlorothiazide]     Hyponatremia      CBC    Component Value Date/Time   WBC 4.1 05/05/2023 0909   WBC 6.0 03/31/2023 0830   RBC 3.36 (L) 05/05/2023 0909   HGB 9.9 (L) 05/05/2023 0909   HGB 7.4 (L) 02/10/2023 2009   HCT 31.1 (L) 05/05/2023 0909   HCT 38.5 07/02/2011 1529   PLT 202 05/05/2023 0909   PLT 311 07/02/2011 1529   MCV 92.6 05/05/2023 0909   MCV 90 07/02/2011 1529   MCH 29.5 05/05/2023 0909   MCHC 31.8 05/05/2023 0909   RDW 15.6 (H) 05/05/2023 0909   RDW 13.4 07/02/2011 1529   LYMPHSABS 0.8 05/05/2023 0909   MONOABS 0.5 05/05/2023 0909   EOSABS 0.2 05/05/2023 0909   BASOSABS 0.1 05/05/2023 0909    Pulmonary Functions Testing Results:     No data to display          Outpatient Medications Prior to Visit  Medication Sig Dispense Refill  acyclovir (ZOVIRAX) 400 MG tablet Take 1 tablet (400 mg total) by mouth 2 (two) times daily. 60 tablet 5   amLODipine (NORVASC) 10 MG tablet Take 1 tablet (10 mg total) by mouth daily. 90 tablet 3   aspirin EC 81 MG tablet Take 1 tablet (81 mg total) by mouth daily. 100 tablet 3   butalbital-acetaminophen-caffeine (FIORICET) 50-325-40 MG tablet TAKE 1 TABLET BY MOUTH EVERY 4 HOURS AS NEEDED FOR HEADACHE 14 tablet 3   Cholecalciferol (VITAMIN D3) 50 MCG (2000 UT) CAPS Take by mouth.     clonazePAM (KLONOPIN) 1 MG tablet TAKE 1 TABLET(1 MG) BY MOUTH TWICE DAILY 60 tablet 2   cyanocobalamin (VITAMIN B12) 1000 MCG/ML injection INJECT 1 ML IN THE MUSCLE EVERY 30 DAYS 4 mL 12   cyclobenzaprine (FLEXERIL) 10 MG tablet Take 0.5-1 tablets (5-10 mg total) by mouth 3 (three) times daily as needed for muscle spasms. 60 tablet 1   dexamethasone (DECADRON) 4 MG tablet Take 5 tabs (20 mg) weekly the day after daratumumab for 12 weeks. Take with breakfast. 20 tablet 5   docusate sodium (COLACE) 100 MG capsule Take 1 capsule (100 mg total) by  mouth 2 (two) times daily. 10 capsule 0   escitalopram (LEXAPRO) 10 MG tablet Take 1 tablet (10 mg total) by mouth daily. 30 tablet 2   HYDROmorphone (DILAUDID) 2 MG tablet Take 2 tablets (4 mg total) by mouth every 6 (six) hours as needed for severe pain (pain score 7-10). 24 tablet 0   ipratropium-albuterol (DUONEB) 0.5-2.5 (3) MG/3ML SOLN Take 3 mLs by nebulization 4 (four) times daily. 360 mL 0   lenalidomide (REVLIMID) 20 MG capsule Take 1 capsule (20 mg total) by mouth daily. Take for 14 days, then hold for 7 days. Repeat every 21 days. 14 capsule 0   levothyroxine (SYNTHROID) 125 MCG tablet Take 2 tablets (250 mcg total) by mouth daily before breakfast. 60 tablet 0   lidocaine-prilocaine (EMLA) cream Apply 1 Application topically once.     ondansetron (ZOFRAN) 8 MG tablet Take 1 tablet (8 mg total) by mouth every 8 (eight) hours as needed for nausea or vomiting. 30 tablet 1   pantoprazole (PROTONIX) 40 MG tablet 30 min before lunch or dinner 90 tablet 3   polyethylene glycol (MIRALAX / GLYCOLAX) 17 g packet Take 17 g by mouth 2 (two) times daily. 14 each 0   prochlorperazine (COMPAZINE) 10 MG tablet Take 1 tablet (10 mg total) by mouth every 6 (six) hours as needed for nausea or vomiting. 30 tablet 1   senna-docusate (SENOKOT-S) 8.6-50 MG tablet Take 1 tablet by mouth 2 (two) times daily. 30 tablet 0   Spacer/Aero-Holding Chambers (AEROCHAMBER MV) inhaler Use as instructed 1 each 0   SYRINGE-NEEDLE, DISP, 3 ML 25G X 1-1/2" 3 ML MISC 1 Device by Does not apply route every 30 (thirty) days. With B12 shot 12 each 1   thiamine (VITAMIN B-1) 100 MG tablet Take 1 tablet (100 mg total) by mouth daily. 30 tablet 0   zolpidem (AMBIEN) 10 MG tablet TAKE 1/2 TO 1 TABLET(5 TO 10 MG) BY MOUTH AT BEDTIME AS NEEDED FOR SLEEP 30 tablet 2   mirtazapine (REMERON) 30 MG tablet Take 30 mg by mouth at bedtime. (Patient not taking: Reported on 05/07/2023)     No facility-administered medications prior to visit.

## 2023-05-08 ENCOUNTER — Other Ambulatory Visit: Payer: Self-pay | Admitting: Oncology

## 2023-05-08 ENCOUNTER — Other Ambulatory Visit (HOSPITAL_COMMUNITY): Payer: Self-pay

## 2023-05-08 ENCOUNTER — Telehealth: Payer: Self-pay

## 2023-05-08 ENCOUNTER — Telehealth: Payer: Self-pay | Admitting: Student in an Organized Health Care Education/Training Program

## 2023-05-08 DIAGNOSIS — C9000 Multiple myeloma not having achieved remission: Secondary | ICD-10-CM

## 2023-05-08 NOTE — Telephone Encounter (Signed)
PT states she is discontinuing O2 and Dr. consulted with her on this yesterday. [employer] will not pick up unless they have a written signed release from [place]. Please submit. Thanks.

## 2023-05-08 NOTE — Telephone Encounter (Signed)
Sent to [facility]  

## 2023-05-08 NOTE — Telephone Encounter (Addendum)
Oral Oncology Patient Advocate Encounter  Prior Authorization for Lenalidomide has been approved.    PA# [id number]  Effective dates: 05/08/23 through 05/07/24  Patient may continue to fill with [facility].   Called [facility] and provided them with the Approval details. Informed an urgent request was placed and that they would be contacting patient within 24 hours to set up delivery.   Called and informed patient of approval. They will call [facility] tomorrow if they have not heard from them by then.    Ardeen Fillers, CPhT Oncology Pharmacy Patient Advocate  [facility]  [phone] (phone) [phone] (fax) 05/08/2023 11:36 AM

## 2023-05-08 NOTE — Telephone Encounter (Signed)
Oral Oncology Patient Advocate Encounter  New authorization  **DOSE CHANGE FROM 15 MG to 20 MG**   Received notification that prior authorization for Lenalidomide is required.   PA submitted verbally to [employer] at [id number] on 05/08/23  Key [id number]  Status is pending     Ardeen Fillers, CPhT Oncology Pharmacy Patient Advocate  [facility]  [phone] (phone) [phone] (fax) 05/08/2023 11:35 AM

## 2023-05-11 LAB — MULTIPLE MYELOMA PANEL, SERUM
Albumin SerPl Elph-Mcnc: 3.6 g/dL (ref 2.9–4.4)
Albumin/Glob SerPl: 1.4 (ref 0.7–1.7)
Alpha 1: 0.3 g/dL (ref 0.0–0.4)
Alpha2 Glob SerPl Elph-Mcnc: 0.7 g/dL (ref 0.4–1.0)
B-Globulin SerPl Elph-Mcnc: 1.4 g/dL — ABNORMAL HIGH (ref 0.7–1.3)
Gamma Glob SerPl Elph-Mcnc: 0.3 g/dL — ABNORMAL LOW (ref 0.4–1.8)
Globulin, Total: 2.7 g/dL (ref 2.2–3.9)
IgA: 46 mg/dL — ABNORMAL LOW (ref 87–352)
IgG (Immunoglobin G), Serum: 1267 mg/dL (ref 586–1602)
IgM (Immunoglobulin M), Srm: 52 mg/dL (ref 26–217)
M Protein SerPl Elph-Mcnc: 0.8 g/dL — ABNORMAL HIGH
Total Protein ELP: 6.3 g/dL (ref 6.0–8.5)

## 2023-05-12 ENCOUNTER — Encounter: Payer: Self-pay | Admitting: Oncology

## 2023-05-12 ENCOUNTER — Inpatient Hospital Stay (HOSPITAL_BASED_OUTPATIENT_CLINIC_OR_DEPARTMENT_OTHER): Payer: Managed Care, Other (non HMO) | Admitting: Oncology

## 2023-05-12 ENCOUNTER — Inpatient Hospital Stay: Payer: Managed Care, Other (non HMO)

## 2023-05-12 ENCOUNTER — Other Ambulatory Visit: Payer: Self-pay | Admitting: *Deleted

## 2023-05-12 VITALS — BP 166/90 | HR 79 | Temp 98.8°F | Resp 18 | Wt 165.0 lb

## 2023-05-12 DIAGNOSIS — Z79899 Other long term (current) drug therapy: Secondary | ICD-10-CM

## 2023-05-12 DIAGNOSIS — C9000 Multiple myeloma not having achieved remission: Secondary | ICD-10-CM | POA: Diagnosis not present

## 2023-05-12 DIAGNOSIS — Z5112 Encounter for antineoplastic immunotherapy: Secondary | ICD-10-CM | POA: Diagnosis not present

## 2023-05-12 DIAGNOSIS — Z5111 Encounter for antineoplastic chemotherapy: Secondary | ICD-10-CM | POA: Diagnosis not present

## 2023-05-12 LAB — CMP (CANCER CENTER ONLY)
ALT: 15 U/L (ref 0–44)
AST: 19 U/L (ref 15–41)
Albumin: 3.5 g/dL (ref 3.5–5.0)
Alkaline Phosphatase: 95 U/L (ref 38–126)
Anion gap: 10 (ref 5–15)
BUN: 18 mg/dL (ref 6–20)
CO2: 23 mmol/L (ref 22–32)
Calcium: 7.8 mg/dL — ABNORMAL LOW (ref 8.9–10.3)
Chloride: 106 mmol/L (ref 98–111)
Creatinine: 0.97 mg/dL (ref 0.44–1.00)
GFR, Estimated: >60 mL/min (ref >60)
Glucose, Bld: 103 mg/dL — ABNORMAL HIGH (ref 70–99)
Potassium: 4.4 mmol/L (ref 3.5–5.1)
Sodium: 139 mmol/L (ref 135–145)
Total Bilirubin: 0.4 mg/dL (ref <1.2)
Total Protein: 6.8 g/dL (ref 6.5–8.1)

## 2023-05-12 LAB — CBC WITH DIFFERENTIAL (CANCER CENTER ONLY)
Abs Immature Granulocytes: 0.01 10*3/uL (ref 0.00–0.07)
Basophils Absolute: 0.1 10*3/uL (ref 0.0–0.1)
Basophils Relative: 2 %
Eosinophils Absolute: 0.1 10*3/uL (ref 0.0–0.5)
Eosinophils Relative: 3 %
HCT: 32.9 % — ABNORMAL LOW (ref 36.0–46.0)
Hemoglobin: 10.6 g/dL — ABNORMAL LOW (ref 12.0–15.0)
Immature Granulocytes: 0 %
Lymphocytes Relative: 31 %
Lymphs Abs: 1.5 10*3/uL (ref 0.7–4.0)
MCH: 29.3 pg (ref 26.0–34.0)
MCHC: 32.2 g/dL (ref 30.0–36.0)
MCV: 90.9 fL (ref 80.0–100.0)
Monocytes Absolute: 0.7 10*3/uL (ref 0.1–1.0)
Monocytes Relative: 15 %
Neutro Abs: 2.4 10*3/uL (ref 1.7–7.7)
Neutrophils Relative %: 49 %
Platelet Count: 288 10*3/uL (ref 150–400)
RBC: 3.62 MIL/uL — ABNORMAL LOW (ref 3.87–5.11)
RDW: 14.2 % (ref 11.5–15.5)
WBC Count: 4.8 10*3/uL (ref 4.0–10.5)
nRBC: 0 % (ref 0.0–0.2)

## 2023-05-12 MED ORDER — HYDROMORPHONE HCL 2 MG PO TABS: 4.0000 mg | ORAL_TABLET | Freq: Four times a day (QID) | ORAL | 0 refills | Status: DC | PRN

## 2023-05-12 MED ORDER — DARATUMUMAB-HYALURONIDASE-FIHJ 1800-30000 MG-UT/15ML ~~LOC~~ SOLN
1800.0000 mg | Freq: Once | SUBCUTANEOUS | Status: AC
Start: 2023-05-12 — End: 2023-05-12
  Administered 2023-05-12: 1800 mg via SUBCUTANEOUS
  Filled 2023-05-12: qty 15

## 2023-05-12 MED ORDER — DEXAMETHASONE 4 MG PO TABS
20.0000 mg | ORAL_TABLET | Freq: Once | ORAL | Status: AC
  Administered 2023-05-12: 20 mg via ORAL
  Filled 2023-05-12: qty 5

## 2023-05-12 MED ORDER — BORTEZOMIB CHEMO SQ INJECTION 3.5 MG (2.5MG/ML)
1.5000 mg/m2 | Freq: Once | INTRAMUSCULAR | Status: AC
Start: 2023-05-12 — End: 2023-05-12
  Administered 2023-05-12: 2.75 mg via SUBCUTANEOUS
  Filled 2023-05-12: qty 1.1

## 2023-05-12 MED ORDER — ACETAMINOPHEN 325 MG PO TABS
650.0000 mg | ORAL_TABLET | Freq: Once | ORAL | Status: AC
  Administered 2023-05-12: 650 mg via ORAL
  Filled 2023-05-12: qty 2

## 2023-05-12 MED ORDER — DIPHENHYDRAMINE HCL 25 MG PO CAPS
50.0000 mg | ORAL_CAPSULE | Freq: Once | ORAL | Status: AC
Start: 2023-05-12 — End: 2023-05-12
  Administered 2023-05-12: 50 mg via ORAL
  Filled 2023-05-12: qty 2

## 2023-05-12 NOTE — Progress Notes (Signed)
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 She was last seen by me in April 2024 and at that time she had complained of mild left chest wall pain radiating to her back.  She underwent cardiac workup and I had done a bone survey at that time which did not show any evidence of bone disease.  Her smoldering multiple myeloma at that time was stable.  Subsequently she has had chest x-ray in May 2024 as well as a rib x-ray in July 2024.  None of these showed any abnormalities.  Her pain has gradually worsened in the last 2 months but especially so in the last 1 week.  She presented to the ER with symptoms of pleuritic chest wall pain and shortness of breath and underwent a CT angio chest which showed a soft tissue mass with bone destruction involving the left seventh rib as well as T6 and T7 vertebral bodies and central canal.  Possibility of cord compression.  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   Results of myeloma work-up from 06/20/2020 showed  an elevated IgG of 3668.  M protein was elevated at 3.1 and IFE showed IgG monoclonal lambda protein.  Beta-2 microglobulin and LDH were normal.  Serum free light chain ratio was elevated at 25 with a free light chain lambda of 303.  Bone marrow biopsy showed increased plasma cells 22% by manual count and 20% by IHC staining for CD138.  Cytogenetics for myeloma showed gain of 1 q.   MRI cervical and lumbar thoracic spine did not show any evidence of lytic lesions.  Bone survey was negative for bone lesions as well.  MRI pelvis with and without contrast showed diffuse patchy heterogeneous marrow concerning for myeloma    Patient had a repeat bone marrow biopsy inAugust 2024 which showed 20% overall plasma cells and hypercellular marrow consistent with plasma cell myeloma.  Cytogenetics normal and FISH studies did not show any abnormalities.  Patient had stabilization procedure/spinal fusion T4-T9 with T6-T7 laminotomies.  Spinal mass biopsy was consistent with plasma cell neoplasm. PET CT scan showed solitary hypermetabolic lytic lesion in the L4 vertebral body consistent with multiple myeloma.  Residual soft tissue density at the left paraspinal resection site T6-T7 with no significant metabolic activity.   Plan was to do outpatient daratumumab Revlimid Velcade dexamethasone regimen.  However patient was found to be in acute kidney failure with a creatinine of 5 in August 2024 and was sent to the ER.  She received cycle 1 of CyBorD in the hospital with improvement in her creatinine with stabilized between 1.5-1.7.  Treatment has been complicated by repeated hospitalizations for acute hypoxic respiratory failure and left pleural effusion which has been treated with antibiotics and thoracentesis.    Subsequently patient's clinical condition improved and she started D VRD regimen as per Valentina Lucks protocol on 03/31/2023  Interval history-patient is tolerating treatments well so far.  He is not using fentanyl patch  for her pain but still relies on Dilaudid 4 mg 4 times a day as needed which is controlling her pain well.  ECOG PS- 1 Pain scale- 3  Review of systems- Review of Systems  Constitutional:  Negative for chills, fever, malaise/fatigue and weight loss.  HENT:  Negative for congestion, ear discharge and nosebleeds.   Eyes:  Negative for blurred vision.  Respiratory:  Negative for cough, hemoptysis, sputum production, shortness of breath and wheezing.   Cardiovascular:  Negative for chest pain, palpitations, orthopnea and claudication.  Gastrointestinal:  Negative for abdominal pain, blood in stool, constipation, diarrhea, heartburn, melena, nausea and vomiting.  Genitourinary:  Negative for dysuria, flank pain, frequency, hematuria and urgency.  Musculoskeletal:  Negative for back pain, joint pain and myalgias.       Left chest wall pain  Skin:  Negative for rash.  Neurological:  Negative for dizziness, tingling, focal weakness, seizures, weakness and headaches.  Endo/Heme/Allergies:  Does not bruise/bleed easily.  Psychiatric/Behavioral:  Negative for depression and suicidal ideas. The patient does not have insomnia.       Allergies  Allergen Reactions   Hctz [Hydrochlorothiazide]     Hyponatremia      Past Medical History:  Diagnosis Date   Anxiety    Arthritis    Asthma    Barrett's esophagus 2015   COVID-19    03/10/20   Depression    GERD (gastroesophageal reflux disease)    Hypertension    Hypothyroidism    Pneumonia    Smoldering myeloma    Thyroid disease      Past Surgical History:  Procedure Laterality Date   ABLATION  2006   APPLICATION OF INTRAOPERATIVE CT SCAN N/A 01/28/2023   Procedure: APPLICATION OF INTRAOPERATIVE CT SCAN;  Surgeon: Loreen Freud, MD;  Location: [facility] ORS;  Service: Neurosurgery;  Laterality: N/A;   BREAST CYST ASPIRATION Left 1998   BREAST SURGERY     cyst aspiration   BREAST SURGERY  1998   cyst aspiration   COLONOSCOPY   03/2014   COLONOSCOPY WITH PROPOFOL N/A 01/06/2020   Procedure: COLONOSCOPY WITH PROPOFOL;  Surgeon: Toledo, Boykin Nearing, MD;  Location: [facility] ENDOSCOPY;  Service: Gastroenterology;  Laterality: N/A;   ENDOMETRIAL ABLATION     ESOPHAGOGASTRODUODENOSCOPY (EGD) WITH PROPOFOL N/A 08/15/2015   Procedure: ESOPHAGOGASTRODUODENOSCOPY (EGD) WITH PROPOFOL;  Surgeon: Christena Deem, MD;  Location[facility] END[facility]COPY;  Service: Endoscopy;  Laterality: N/A;   ESOPHAGOGASTRODUODENOSCOPY (EGD) WITH PROPOFOL N/A 01/22/2016   Procedure: ESOPHAGOGASTRODUODENOSCOPY (EGD) WITH PROPOFOL;  Surgeon: Christena Deem, MD;  Location: [facility] ENDOSCOPY;  Service: Endoscopy;  Laterality: N/A;   ESOPHAGOGASTRODUODENOSCOPY (EGD) WITH PROPOFOL N/A 01/06/2020   Procedure: ESOPHAGOGASTRODUODENOSCOPY (EGD) WITH PROPOFOL;  Surgeon: Toledo, Boykin Nearing, MD;  Location: [facility] ENDOSCOPY;  Service: Gastroenterology;  Laterality: N/A;   FLEXIBLE BRONCHOSCOPY Bilateral 03/13/2023   Procedure: FLEXIBLE BRONCHOSCOPY;  Surgeon: Erin Fulling, MD;  Location: [facility] ORS;  Service: Cardiopulmonary;  Laterality: Bilateral;   GASTRIC BYPASS  2005   HERNIA REPAIR     IR BONE MARROW BIOPSY & ASPIRATION  02/05/2023   NASAL SINUS SURGERY     partial amputation Left  left 5th toe   TOTAL THYROIDECTOMY     TUBAL LIGATION      Social History   Socioeconomic History   Marital status: Divorced    Spouse name: Not on file   Number of children: 2   Years of education: Not on file   Highest education level: GED or equivalent  Occupational History   Occupation: Part-time  Tobacco Use   Smoking status: Former    Types: Cigarettes   Smokeless tobacco: Never   Tobacco comments:    Quit smoking 02/16/2023  Vaping Use   Vaping status: Never Used  Substance and Sexual Activity   Alcohol use: Yes    Alcohol/week: 10.0 - 12.0 standard drinks of alcohol    Types: 8 - 10 Glasses of wine, 2 Standard drinks or equivalent per week   Drug use: No   Sexual  activity: Yes    Partners: Male  Other Topics Concern   Not on file  Social History Narrative   Lives in [place] single, divorced       Work - 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       Diet - healthy diet   Exercise - limited   Caffeine use: daily   Social Determinants of Health   Financial Resource Strain: Low Risk  (11/01/2022)   Overall Financial Resource Strain (CARDIA)    Difficulty of Paying Living Expenses: Not very hard  Food Insecurity: No Food Insecurity (03/18/2023)   Hunger Vital Sign    Worried About Running Out of Food in the Last Year: Never true    Ran Out of Food in the Last Year: Never true  Transportation Needs: No Transportation Needs (03/18/2023)   PRAPARE - Administrator, Civil Service (Medical): No    Lack of Transportation (Non-Medical): No  Physical Activity: Insufficiently Active (11/01/2022)   Exercise Vital Sign    Days of Exercise per Week: 3 days    Minutes of Exercise per Session: 20 min  Stress: Stress Concern Present (03/04/2023)   [employer] of Occupational Health - Occupational Stress Questionnaire    Feeling of Stress : Very much  Social Connections: Socially Isolated (03/04/2023)   Social Connection and Isolation Panel [NHANES]    Frequency of Communication with Friends and Family: Never    Frequency of Social Gatherings with Friends and Family: Never    Attends Religious Services: Never    Database administrator or Organizations: No    Attends Banker Meetings: Never    Marital Status: Divorced  Catering manager Violence: Not At Risk (03/06/2023)   Humiliation, Afraid, Rape, and Kick questionnaire    Fear of Current or Ex-Partner: No    Emotionally Abused: No    Physically Abused: No    Sexually Abused: No    Family History  Problem Relation Age of Onset   Heart disease Mother    COPD Mother    Arthritis Mother    Cancer Mother        uterine   Lupus Mother    Anxiety disorder Mother     Depression Mother    Drug abuse Mother    COPD Father    Arthritis Father    Heart disease Father    Heart attack Father    Alcohol abuse Father    Heart disease Brother    Heart attack Brother    Drug abuse Brother    Anxiety disorder Brother    Depression Brother  Heart disease Brother    Cancer Brother        liver cancer age 53 y.o   Diabetes Maternal Grandmother    Diabetes Maternal Grandfather    Diabetes Paternal Grandmother    Diabetes Paternal Grandfather    Breast cancer Neg Hx      Current Outpatient Medications:    acyclovir (ZOVIRAX) 400 MG tablet, Take 1 tablet (400 mg total) by mouth 2 (two) times daily., Disp: 60 tablet, Rfl: 5   amLODipine (NORVASC) 10 MG tablet, Take 1 tablet (10 mg total) by mouth daily., Disp: 90 tablet, Rfl: 3   aspirin EC 81 MG tablet, Take 1 tablet (81 mg total) by mouth daily., Disp: 100 tablet, Rfl: 3   butalbital-acetaminophen-caffeine (FIORICET) 50-325-40 MG tablet, TAKE 1 TABLET BY MOUTH EVERY 4 HOURS AS NEEDED FOR HEADACHE, Disp: 14 tablet, Rfl: 3   Cholecalciferol (VITAMIN D3) 50 MCG (2000 UT) CAPS, Take by mouth., Disp: , Rfl:    clonazePAM (KLONOPIN) 1 MG tablet, TAKE 1 TABLET(1 MG) BY MOUTH TWICE DAILY, Disp: 60 tablet, Rfl: 2   cyanocobalamin (VITAMIN B12) 1000 MCG/ML injection, INJECT 1 ML IN THE MUSCLE EVERY 30 DAYS, Disp: 4 mL, Rfl: 12   cyclobenzaprine (FLEXERIL) 10 MG tablet, Take 0.5-1 tablets (5-10 mg total) by mouth 3 (three) times daily as needed for muscle spasms., Disp: 60 tablet, Rfl: 1   dexamethasone (DECADRON) 4 MG tablet, Take 5 tabs (20 mg) weekly the day after daratumumab for 12 weeks. Take with breakfast., Disp: 20 tablet, Rfl: 5   docusate sodium (COLACE) 100 MG capsule, Take 1 capsule (100 mg total) by mouth 2 (two) times daily., Disp: 10 capsule, Rfl: 0   escitalopram (LEXAPRO) 10 MG tablet, Take 1 tablet (10 mg total) by mouth daily., Disp: 30 tablet, Rfl: 2   HYDROmorphone (DILAUDID) 2 MG tablet, Take 2  tablets (4 mg total) by mouth every 6 (six) hours as needed for severe pain (pain score 7-10)., Disp: 112 tablet, Rfl: 0   ipratropium-albuterol (DUONEB) 0.5-2.5 (3) MG/3ML SOLN, Take 3 mLs by nebulization 4 (four) times daily., Disp: 360 mL, Rfl: 0   lenalidomide (REVLIMID) 20 MG capsule, Take 1 capsule (20 mg total) by mouth daily. Take for 14 days, then hold for 7 days. Repeat every 21 days., Disp: 14 capsule, Rfl: 0   levothyroxine (SYNTHROID) 125 MCG tablet, Take 2 tablets (250 mcg total) by mouth daily before breakfast., Disp: 60 tablet, Rfl: 0   lidocaine-prilocaine (EMLA) cream, Apply 1 Application topically once., Disp: , Rfl:    ondansetron (ZOFRAN) 8 MG tablet, Take 1 tablet (8 mg total) by mouth every 8 (eight) hours as needed for nausea or vomiting., Disp: 30 tablet, Rfl: 1   pantoprazole (PROTONIX) 40 MG tablet, 30 min before lunch or dinner, Disp: 90 tablet, Rfl: 3   polyethylene glycol (MIRALAX / GLYCOLAX) 17 g packet, Take 17 g by mouth 2 (two) times daily., Disp: 14 each, Rfl: 0   prochlorperazine (COMPAZINE) 10 MG tablet, Take 1 tablet (10 mg total) by mouth every 6 (six) hours as needed for nausea or vomiting., Disp: 30 tablet, Rfl: 1   senna-docusate (SENOKOT-S) 8.6-50 MG tablet, Take 1 tablet by mouth 2 (two) times daily., Disp: 30 tablet, Rfl: 0   Spacer/Aero-Holding Chambers (AEROCHAMBER MV) inhaler, Use as instructed, Disp: 1 each, Rfl: 0   SYRINGE-NEEDLE, DISP, 3 ML 25G X 1-1/2" 3 ML MISC, 1 Device by Does not apply route every 30 (thirty) days. With B12  shot, Disp: 12 each, Rfl: 1   thiamine (VITAMIN B-1) 100 MG tablet, Take 1 tablet (100 mg total) by mouth daily., Disp: 30 tablet, Rfl: 0   zolpidem (AMBIEN) 10 MG tablet, TAKE 1/2 TO 1 TABLET(5 TO 10 MG) BY MOUTH AT BEDTIME AS NEEDED FOR SLEEP, Disp: 30 tablet, Rfl: 2  Physical exam:  Vitals:   05/12/23 0911 05/12/23 0914  BP: (!) 169/99 (!) 166/90  Pulse: 79   Resp: 18   Temp: 98.8 F (37.1 C)   TempSrc: Tympanic    SpO2: 100%   Weight: 165 lb (74.8 kg)    Physical Exam Cardiovascular:     Rate and Rhythm: Normal rate and regular rhythm.     Heart sounds: Normal heart sounds.  Pulmonary:     Effort: Pulmonary effort is normal.     Breath sounds: Normal breath sounds.  Skin:    General: Skin is warm and dry.  Neurological:     Mental Status: She is alert and oriented to person, place, and time.         Latest Ref Rng & Units 05/12/2023    8:43 AM  CMP  Glucose 70 - 99 mg/dL 161   BUN 6 - 20 mg/dL 18   Creatinine 0.96 - 1.00 mg/dL 0.45   Sodium 409 - 811 mmol/L 139   Potassium 3.5 - 5.1 mmol/L 4.4   Chloride 98 - 111 mmol/L 106   CO2 22 - 32 mmol/L 23   Calcium 8.9 - 10.3 mg/dL 7.8   Total Protein 6.5 - 8.1 g/dL 6.8   Total Bilirubin <9.1 mg/dL 0.4   Alkaline Phos 38 - 126 U/L 95   AST 15 - 41 U/L 19   ALT 0 - 44 U/L 15       Latest Ref Rng & Units 05/12/2023    8:43 AM  CBC  WBC 4.0 - 10.5 K/uL 4.8   Hemoglobin 12.0 - 15.0 g/dL 47.8   Hematocrit 29.5 - 46.0 % 32.9   Platelets 150 - 400 K/uL 288     No images are attached to the encounter.  DG Thoracic Spine 2 View  Result Date: 05/10/2023 CLINICAL DATA:  Status post fusion EXAM: THORACIC SPINE 2 VIEWS COMPARISON:  01/31/2023, CT 03/11/2023 FINDINGS: Posterior spinal rods and transpedicular screwsT4 through T9 with stable appearance of the hardware. Chronic superior endplate deformity at T11. Interval mild compression deformity at T6, lytic lesions at T6 and T7 are noted on interval CT imaging. IMPRESSION: 1. Posterior fusion T4 through T9 with stable appearance of the hardware. 2. Interval mild compression deformity at T6 compared with radiograph from August. Electronically Signed   By: Jasmine Pang M.D.   On: 05/10/2023 21:40     Assessment and plan- Patient is a 57 y.o. female with standard risk IgG lambda multiple myeloma R-ISS stage II.  She is here for on treatment assessment prior to cycle 1 day 1 of Darzalex and  Velcade  Counts okay to proceed with cycle 3 day 1 of Darzalex and Velcade today as per [facility] protocol.  She is receiving Velcade twice a week but will only receive it once a week this week due to Thanksgiving.  I will see her back in 2 weeks for cycle 3-day 15 of treatment.  Patient will be seen by [facility] next week for evaluation for bone marrow transplant.  Depending on the timing of transplant I will continue with consolidation treatment as per [facility] protocol as she  awaits transplant.  Overall her myeloma numbers have responded well And she is currently in partial response as evidenced by more than 50% reduction in her M protein although it still remains detectable on electrophoresis  Continue aspirin and acyclovir prophylaxis  Patient will receive Xgeva next week if her calcium is 8.5 or higher.  I have asked her to start taking calcium supplements 1200 mg daily  Neoplasm related pain: Continue as needed Dilaudid   Visit Diagnosis 1. Multiple myeloma not having achieved remission (HCC)   2. High risk medication use   3. Encounter for antineoplastic chemotherapy      Dr. Owens Shark, MD, MPH [facility] at [facility] [phone] 05/12/2023 12:45 PM

## 2023-05-12 NOTE — Progress Notes (Signed)
Confirmed w/ Dr Smith Robert - Velcade dosed at 1.5 mg/m2 today only since pt not returning this week for Day 11.  Further dosing will be 1.3 mg/m2.  Ebony Hail, Pharm.D., CPP 05/12/2023@10 :40 AM

## 2023-05-12 NOTE — Patient Instructions (Signed)
[facility]  Discharge Instructions: Thank you for choosing [facility] to provide your oncology and hematology care.  If you have a lab appointment with the Cancer Center, please go directly to the Cancer Center and check in at the registration area.  Wear comfortable clothing and clothing appropriate for easy access to any Portacath or PICC line.   We strive to give you quality time with your provider. You may need to reschedule your appointment if you arrive late (15 or more minutes).  Arriving late affects you and other patients whose appointments are after yours.  Also, if you miss three or more appointments without notifying the office, you may be dismissed from the clinic at the provider's discretion.      For prescription refill requests, have your pharmacy contact our office and allow 72 hours for refills to be completed.    Today you received the following chemotherapy and/or immunotherapy agents DARZALEX, VELCADE      To help prevent nausea and vomiting after your treatment, we encourage you to take your nausea medication as directed.  BELOW ARE SYMPTOMS THAT SHOULD BE REPORTED IMMEDIATELY: *FEVER GREATER THAN 100.4 F (38 C) OR HIGHER *CHILLS OR SWEATING *NAUSEA AND VOMITING THAT IS NOT CONTROLLED WITH YOUR NAUSEA MEDICATION *UNUSUAL SHORTNESS OF BREATH *UNUSUAL BRUISING OR BLEEDING *URINARY PROBLEMS (pain or burning when urinating, or frequent urination) *BOWEL PROBLEMS (unusual diarrhea, constipation, pain near the anus) TENDERNESS IN MOUTH AND THROAT WITH OR WITHOUT PRESENCE OF ULCERS (sore throat, sores in mouth, or a toothache) UNUSUAL RASH, SWELLING OR PAIN  UNUSUAL VAGINAL DISCHARGE OR ITCHING   Items with * indicate a potential emergency and should be followed up as soon as possible or go to the Emergency Department if any problems should occur.  Please show the CHEMOTHERAPY ALERT CARD or  IMMUNOTHERAPY ALERT CARD at check-in to the Emergency Department and triage nurse.  Should you have questions after your visit or need to cancel or reschedule your appointment, please contact [facility] - A DEPT OF [facility] Brideg[facility]  [phone] and follow the prompts.  Office hours are 8:00 a.m. to 4:30 p.m. Monday - Friday. Please note that voicemails left after 4:00 p.m. may not be returned until the following business day.  We are closed weekends and major holidays. You have access to a nurse at all times for urgent questions. Please call the main number to the clinic [phone] and follow the prompts.  For any non-urgent questions, you may also contact your provider using MyChart. We now offer e-Visits for anyone 98 and older to request care online for non-urgent symptoms. For details visit [email or web address].   Also download the MyChart app! Go to the app store, search "MyChart", open the app, select [place], and log in with your MyChart username and password.  Daratumumab Injection What is this medication? DARATUMUMAB (dar a toom ue mab) treats multiple myeloma, a type of bone marrow cancer. It works by helping your immune system slow or stop the spread of cancer cells. It is a monoclonal antibody. This medicine may be used for other purposes; ask your health care provider or pharmacist if you have questions. COMMON BRAND NAME(S): DARZALEX What should I tell my care team before I take this medication? They need to know if you have any of these conditions: Hereditary fructose intolerance Infection, such as chickenpox, herpes, hepatitis B Lung or breathing disease,  such as asthma, COPD An unusual or allergic reaction to daratumumab, sorbitol, other medications, foods, dyes, or preservatives Pregnant or trying to get pregnant Breastfeeding How should I use this medication? This medication is injected into a vein. It is given by your care team in a  hospital or clinic setting. Talk to your care team about the use of this medication in children. Special care may be needed. Overdosage: If you think you have taken too much of this medicine contact a poison control center or emergency room at once. NOTE: This medicine is only for you. Do not share this medicine with others. What if I miss a dose? Keep appointments for follow-up doses. It is important not to miss your dose. Call your care team if you are unable to keep an appointment. What may interact with this medication? Interactions have not been studied. This list may not describe all possible interactions. Give your health care provider a list of all the medicines, herbs, non-prescription drugs, or dietary supplements you use. Also tell them if you smoke, drink alcohol, or use illegal drugs. Some items may interact with your medicine. What should I watch for while using this medication? Your condition will be monitored carefully while you are receiving this medication. This medication can cause serious allergic reactions. To reduce your risk, your care team may give you other medication to take before receiving this one. Be sure to follow the directions from your care team. This medication can affect the results of blood tests to match your blood type. These changes can last for up to 6 months after the final dose. Your care team will do blood tests to match your blood type before you start treatment. Tell all of your care team that you are being treated with this medication before receiving a blood transfusion. This medication can affect the results of some tests used to determine treatment response; extra tests may be needed to evaluate response. Talk to your care team if you wish to become pregnant or think you are pregnant. This medication can cause serious birth defects if taken during pregnancy and for 3 months after the last dose. A reliable form of contraception is recommended while  taking this medication and for 3 months after the last dose. Talk to your care team about effective forms of contraception. Do not breast-feed while taking this medication. What side effects may I notice from receiving this medication? Side effects that you should report to your care team as soon as possible: Allergic reactions--skin rash, itching, hives, swelling of the face, lips, tongue, or throat Infection--fever, chills, cough, sore throat, wounds that don't heal, pain or trouble when passing urine, general feeling of discomfort or being unwell Infusion reactions--chest pain, shortness of breath or trouble breathing, feeling faint or lightheaded Unusual bruising or bleeding Side effects that usually do not require medical attention (report to your care team if they continue or are bothersome): Constipation Diarrhea Fatigue Nausea Pain, tingling, or numbness in the hands or feet Swelling of the ankles, hands, or feet This list may not describe all possible side effects. Call your doctor for medical advice about side effects. You may report side effects to FDA at 1-800-FDA-1088. Where should I keep my medication? This medication is given in a hospital or clinic. It will not be stored at home. NOTE: This sheet is a summary. It may not cover all possible information. If you have questions about this medicine, talk to your doctor, pharmacist, or health care provider.  2024 Elsevier/Gold Standard (2022-04-11 00:00:00)  Bortezomib Injection What is this medication? BORTEZOMIB (bor TEZ oh mib) treats lymphoma. It may also be used to treat multiple myeloma, a type of bone marrow cancer. It works by blocking a protein that causes cancer cells to grow and multiply. This helps to slow or stop the spread of cancer cells. This medicine may be used for other purposes; ask your health care provider or pharmacist if you have questions. COMMON BRAND NAME(S): Velcade What should I tell my care team  before I take this medication? They need to know if you have any of these conditions: Dehydration Diabetes Heart disease Liver disease Tingling of the fingers or toes or other nerve disorder An unusual or allergic reaction to bortezomib, other medications, foods, dyes, or preservatives If you or your partner are pregnant or trying to get pregnant Breastfeeding How should I use this medication? This medication is injected into a vein or under the skin. It is given by your care team in a hospital or clinic setting. Talk to your care team about the use of this medication in children. Special care may be needed. Overdosage: If you think you have taken too much of this medicine contact a poison control center or emergency room at once. NOTE: This medicine is only for you. Do not share this medicine with others. What if I miss a dose? Keep appointments for follow-up doses. It is important not to miss your dose. Call your care team if you are unable to keep an appointment. What may interact with this medication? Ketoconazole Rifampin This list may not describe all possible interactions. Give your health care provider a list of all the medicines, herbs, non-prescription drugs, or dietary supplements you use. Also tell them if you smoke, drink alcohol, or use illegal drugs. Some items may interact with your medicine. What should I watch for while using this medication? Your condition will be monitored carefully while you are receiving this medication. You may need blood work while taking this medication. This medication may affect your coordination, reaction time, or judgment. Do not drive or operate machinery until you know how this medication affects you. Sit up or stand slowly to reduce the risk of dizzy or fainting spells. Drinking alcohol with this medication can increase the risk of these side effects. This medication may increase your risk of getting an infection. Call your care team for advice  if you get a fever, chills, sore throat, or other symptoms of a cold or flu. Do not treat yourself. Try to avoid being around people who are sick. Check with your care team if you have severe diarrhea, nausea, and vomiting, or if you sweat a lot. The loss of too much body fluid may make it dangerous for you to take this medication. Talk to your care team if you may be pregnant. Serious birth defects can occur if you take this medication during pregnancy and for 7 months after the last dose. You will need a negative pregnancy test before starting this medication. Contraception is recommended while taking this medication and for 7 months after the last dose. Your care team can help you find the option that works for you. If your partner can get pregnant, use a condom during sex while taking this medication and for 4 months after the last dose. Do not breastfeed while taking this medication and for 2 months after the last dose. This medication may cause infertility. Talk to your care team if you are  concerned about your fertility. What side effects may I notice from receiving this medication? Side effects that you should report to your care team as soon as possible: Allergic reactions--skin rash, itching, hives, swelling of the face, lips, tongue, or throat Bleeding--bloody or black, tar-like stools, vomiting blood or brown material that looks like coffee grounds, red or dark brown urine, small red or purple spots on skin, unusual bruising or bleeding Bleeding in the brain--severe headache, stiff neck, confusion, dizziness, change in vision, numbness or weakness of the face, arm, or leg, trouble speaking, trouble walking, vomiting Bowel blockage--stomach cramping, unable to have a bowel movement or pass gas, loss of appetite, vomiting Heart failure--shortness of breath, swelling of the ankles, feet, or hands, sudden weight gain, unusual weakness or fatigue Infection--fever, chills, cough, sore throat,  wounds that don't heal, pain or trouble when passing urine, general feeling of discomfort or being unwell Liver injury--right upper belly pain, loss of appetite, nausea, light-colored stool, dark yellow or brown urine, yellowing skin or eyes, unusual weakness or fatigue Low blood pressure--dizziness, feeling faint or lightheaded, blurry vision Lung injury--shortness of breath or trouble breathing, cough, spitting up blood, chest pain, fever Pain, tingling, or numbness in the hands or feet Severe or prolonged diarrhea Stomach pain, bloody diarrhea, pale skin, unusual weakness or fatigue, decrease in the amount of urine, which may be signs of hemolytic uremic syndrome Sudden and severe headache, confusion, change in vision, seizures, which may be signs of posterior reversible encephalopathy syndrome (PRES) TTP--purple spots on the skin or inside the mouth, pale skin, yellowing skin or eyes, unusual weakness or fatigue, fever, fast or irregular heartbeat, confusion, change in vision, trouble speaking, trouble walking Tumor lysis syndrome (TLS)--nausea, vomiting, diarrhea, decrease in the amount of urine, dark urine, unusual weakness or fatigue, confusion, muscle pain or cramps, fast or irregular heartbeat, joint pain Side effects that usually do not require medical attention (report to your care team if they continue or are bothersome): Constipation Diarrhea Fatigue Loss of appetite Nausea This list may not describe all possible side effects. Call your doctor for medical advice about side effects. You may report side effects to FDA at 1-800-FDA-1088. Where should I keep my medication? This medication is given in a hospital or clinic. It will not be stored at home. NOTE: This sheet is a summary. It may not cover all possible information. If you have questions about this medicine, talk to your doctor, pharmacist, or health care provider.  2024 Elsevier/Gold Standard (2021-11-06 00:00:00)

## 2023-05-13 ENCOUNTER — Other Ambulatory Visit: Payer: Self-pay | Admitting: Oncology

## 2023-05-13 LAB — KAPPA/LAMBDA LIGHT CHAINS
Kappa free light chain: 19.1 mg/L (ref 3.3–19.4)
Kappa, lambda light chain ratio: 1.13 (ref 0.26–1.65)
Lambda free light chains: 16.9 mg/L (ref 5.7–26.3)

## 2023-05-19 ENCOUNTER — Inpatient Hospital Stay: Payer: Managed Care, Other (non HMO) | Attending: Oncology

## 2023-05-19 ENCOUNTER — Encounter: Payer: Self-pay | Admitting: Oncology

## 2023-05-19 ENCOUNTER — Inpatient Hospital Stay: Payer: Managed Care, Other (non HMO)

## 2023-05-19 VITALS — BP 137/74 | HR 75 | Temp 98.6°F | Resp 16

## 2023-05-19 DIAGNOSIS — Z9884 Bariatric surgery status: Secondary | ICD-10-CM | POA: Diagnosis not present

## 2023-05-19 DIAGNOSIS — Z8719 Personal history of other diseases of the digestive system: Secondary | ICD-10-CM | POA: Diagnosis not present

## 2023-05-19 DIAGNOSIS — R0981 Nasal congestion: Secondary | ICD-10-CM | POA: Insufficient documentation

## 2023-05-19 DIAGNOSIS — G893 Neoplasm related pain (acute) (chronic): Secondary | ICD-10-CM | POA: Diagnosis not present

## 2023-05-19 DIAGNOSIS — Z79899 Other long term (current) drug therapy: Secondary | ICD-10-CM | POA: Diagnosis not present

## 2023-05-19 DIAGNOSIS — Z7984 Long term (current) use of oral hypoglycemic drugs: Secondary | ICD-10-CM | POA: Insufficient documentation

## 2023-05-19 DIAGNOSIS — Z79891 Long term (current) use of opiate analgesic: Secondary | ICD-10-CM | POA: Diagnosis not present

## 2023-05-19 DIAGNOSIS — Z79624 Long term (current) use of inhibitors of nucleotide synthesis: Secondary | ICD-10-CM | POA: Diagnosis not present

## 2023-05-19 DIAGNOSIS — C9000 Multiple myeloma not having achieved remission: Secondary | ICD-10-CM

## 2023-05-19 DIAGNOSIS — Z800 Family history of malignant neoplasm of digestive organs: Secondary | ICD-10-CM | POA: Insufficient documentation

## 2023-05-19 DIAGNOSIS — Z5112 Encounter for antineoplastic immunotherapy: Secondary | ICD-10-CM | POA: Insufficient documentation

## 2023-05-19 DIAGNOSIS — Z87891 Personal history of nicotine dependence: Secondary | ICD-10-CM | POA: Diagnosis not present

## 2023-05-19 DIAGNOSIS — Z923 Personal history of irradiation: Secondary | ICD-10-CM | POA: Insufficient documentation

## 2023-05-19 DIAGNOSIS — Z7982 Long term (current) use of aspirin: Secondary | ICD-10-CM | POA: Diagnosis not present

## 2023-05-19 DIAGNOSIS — R059 Cough, unspecified: Secondary | ICD-10-CM | POA: Diagnosis not present

## 2023-05-19 LAB — CMP (CANCER CENTER ONLY)
ALT: 12 U/L (ref 0–44)
AST: 16 U/L (ref 15–41)
Albumin: 3.5 g/dL (ref 3.5–5.0)
Alkaline Phosphatase: 86 U/L (ref 38–126)
Anion gap: 10 (ref 5–15)
BUN: 15 mg/dL (ref 6–20)
CO2: 24 mmol/L (ref 22–32)
Calcium: 7.6 mg/dL — ABNORMAL LOW (ref 8.9–10.3)
Chloride: 103 mmol/L (ref 98–111)
Creatinine: 0.89 mg/dL (ref 0.44–1.00)
GFR, Estimated: >60 mL/min (ref >60)
Glucose, Bld: 159 mg/dL — ABNORMAL HIGH (ref 70–99)
Potassium: 3.4 mmol/L — ABNORMAL LOW (ref 3.5–5.1)
Sodium: 137 mmol/L (ref 135–145)
Total Bilirubin: 0.5 mg/dL (ref <1.2)
Total Protein: 6.2 g/dL — ABNORMAL LOW (ref 6.5–8.1)

## 2023-05-19 LAB — CBC WITH DIFFERENTIAL (CANCER CENTER ONLY)
Abs Immature Granulocytes: 0.01 10*3/uL (ref 0.00–0.07)
Basophils Absolute: 0 10*3/uL (ref 0.0–0.1)
Basophils Relative: 1 %
Eosinophils Absolute: 0.5 10*3/uL (ref 0.0–0.5)
Eosinophils Relative: 10 %
HCT: 32.8 % — ABNORMAL LOW (ref 36.0–46.0)
Hemoglobin: 10.4 g/dL — ABNORMAL LOW (ref 12.0–15.0)
Immature Granulocytes: 0 %
Lymphocytes Relative: 26 %
Lymphs Abs: 1.3 10*3/uL (ref 0.7–4.0)
MCH: 29.3 pg (ref 26.0–34.0)
MCHC: 31.7 g/dL (ref 30.0–36.0)
MCV: 92.4 fL (ref 80.0–100.0)
Monocytes Absolute: 0.4 10*3/uL (ref 0.1–1.0)
Monocytes Relative: 8 %
Neutro Abs: 2.7 10*3/uL (ref 1.7–7.7)
Neutrophils Relative %: 55 %
Platelet Count: 216 10*3/uL (ref 150–400)
RBC: 3.55 MIL/uL — ABNORMAL LOW (ref 3.87–5.11)
RDW: 14.2 % (ref 11.5–15.5)
WBC Count: 4.9 10*3/uL (ref 4.0–10.5)
nRBC: 0 % (ref 0.0–0.2)

## 2023-05-19 MED ORDER — DARATUMUMAB-HYALURONIDASE-FIHJ 1800-30000 MG-UT/15ML ~~LOC~~ SOLN
1800.0000 mg | Freq: Once | SUBCUTANEOUS | Status: AC
  Administered 2023-05-19: 1800 mg via SUBCUTANEOUS
  Filled 2023-05-19: qty 15

## 2023-05-19 MED ORDER — DIPHENHYDRAMINE HCL 25 MG PO CAPS
50.0000 mg | ORAL_CAPSULE | Freq: Once | ORAL | Status: AC
  Administered 2023-05-19: 50 mg via ORAL
  Filled 2023-05-19: qty 2

## 2023-05-19 MED ORDER — DEXAMETHASONE 4 MG PO TABS
20.0000 mg | ORAL_TABLET | Freq: Once | ORAL | Status: AC
  Administered 2023-05-19: 20 mg via ORAL
  Filled 2023-05-19: qty 5

## 2023-05-19 MED ORDER — ACETAMINOPHEN 325 MG PO TABS
650.0000 mg | ORAL_TABLET | Freq: Once | ORAL | Status: AC
  Administered 2023-05-19: 650 mg via ORAL
  Filled 2023-05-19: qty 2

## 2023-05-19 MED ORDER — BORTEZOMIB CHEMO SQ INJECTION 3.5 MG (2.5MG/ML)
1.3000 mg/m2 | Freq: Once | INTRAMUSCULAR | Status: AC
  Administered 2023-05-19: 2.25 mg via SUBCUTANEOUS
  Filled 2023-05-19: qty 0.9

## 2023-05-19 NOTE — Patient Instructions (Signed)
[facility]  Discharge Instructions: Thank you for choosing [facility] to provide your oncology and hematology care.  If you have a lab appointment with the Cancer Center, please go directly to the Cancer Center and check in at the registration area.  Wear comfortable clothing and clothing appropriate for easy access to any Portacath or PICC line.   We strive to give you quality time with your provider. You may need to reschedule your appointment if you arrive late (15 or more minutes).  Arriving late affects you and other patients whose appointments are after yours.  Also, if you miss three or more appointments without notifying the office, you may be dismissed from the clinic at the provider's discretion.      For prescription refill requests, have your pharmacy contact our office and allow 72 hours for refills to be completed.    Today you received the following chemotherapy and/or immunotherapy agents- darzalex faspro, velcade      To help prevent nausea and vomiting after your treatment, we encourage you to take your nausea medication as directed.  BELOW ARE SYMPTOMS THAT SHOULD BE REPORTED IMMEDIATELY: *FEVER GREATER THAN 100.4 F (38 C) OR HIGHER *CHILLS OR SWEATING *NAUSEA AND VOMITING THAT IS NOT CONTROLLED WITH YOUR NAUSEA MEDICATION *UNUSUAL SHORTNESS OF BREATH *UNUSUAL BRUISING OR BLEEDING *URINARY PROBLEMS (pain or burning when urinating, or frequent urination) *BOWEL PROBLEMS (unusual diarrhea, constipation, pain near the anus) TENDERNESS IN MOUTH AND THROAT WITH OR WITHOUT PRESENCE OF ULCERS (sore throat, sores in mouth, or a toothache) UNUSUAL RASH, SWELLING OR PAIN  UNUSUAL VAGINAL DISCHARGE OR ITCHING   Items with * indicate a potential emergency and should be followed up as soon as possible or go to the Emergency Department if any problems should occur.  Please show the CHEMOTHERAPY ALERT CARD or  IMMUNOTHERAPY ALERT CARD at check-in to the Emergency Department and triage nurse.  Should you have questions after your visit or need to cancel or reschedule your appointment, please contact [facility] - A DEPT OF [facility]  [phone] and follow the prompts.  Office hours are 8:00 a.m. to 4:30 p.m. Monday - Friday. Please note that voicemails left after 4:00 p.m. may not be returned until the following business day.  We are closed weekends and major holidays. You have access to a nurse at all times for urgent questions. Please call the main number to the clinic [phone] and follow the prompts.  For any non-urgent questions, you may also contact your provider using MyChart. We now offer e-Visits for anyone 68 and older to request care online for non-urgent symptoms. For details visit [email or web address].   Also download the MyChart app! Go to the app store, search "MyChart", open the app, select [place], and log in with your MyChart username and password.

## 2023-05-19 NOTE — Progress Notes (Signed)
Clinical Summary 2024-10 Name: Paula Garrett, Paula Garrett DOB: 09/28/1965 Patient MRN: [id number] Referring Provider: Laurette Schimke [facility] Name: Marissa Nestle Date of Last Psychiatric Consultant Review: 2023-03-25 Completed Behavioral Health Coach Visits Completed [facility] 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 No new action requested. Please reference last Care Plan for most up-to-date behavioral health recommendations. Safety Plan: No Safety Plan Date:

## 2023-05-22 ENCOUNTER — Inpatient Hospital Stay: Payer: Managed Care, Other (non HMO)

## 2023-05-22 VITALS — BP 143/82 | HR 86 | Temp 97.0°F | Resp 17 | Wt 169.0 lb

## 2023-05-22 DIAGNOSIS — Z5112 Encounter for antineoplastic immunotherapy: Secondary | ICD-10-CM | POA: Diagnosis not present

## 2023-05-22 DIAGNOSIS — C9000 Multiple myeloma not having achieved remission: Secondary | ICD-10-CM

## 2023-05-22 MED ORDER — PROCHLORPERAZINE MALEATE 10 MG PO TABS
10.0000 mg | ORAL_TABLET | Freq: Once | ORAL | Status: AC
  Administered 2023-05-22: 10 mg via ORAL
  Filled 2023-05-22: qty 1

## 2023-05-22 MED ORDER — BORTEZOMIB CHEMO SQ INJECTION 3.5 MG (2.5MG/ML)
1.3000 mg/m2 | Freq: Once | INTRAMUSCULAR | Status: AC
  Administered 2023-05-22: 2.25 mg via SUBCUTANEOUS
  Filled 2023-05-22: qty 0.9

## 2023-05-22 NOTE — Patient Instructions (Signed)
[facility]  Discharge Instructions: Thank you for choosing [facility] to provide your oncology and hematology care.  If you have a lab appointment with the Cancer Center, please go directly to the Cancer Center and check in at the registration area.  Wear comfortable clothing and clothing appropriate for easy access to any Portacath or PICC line.   We strive to give you quality time with your provider. You may need to reschedule your appointment if you arrive late (15 or more minutes).  Arriving late affects you and other patients whose appointments are after yours.  Also, if you miss three or more appointments without notifying the office, you may be dismissed from the clinic at the provider's discretion.      For prescription refill requests, have your pharmacy contact our office and allow 72 hours for refills to be completed.    Today you received the following chemotherapy and/or immunotherapy agents Velcade       To help prevent nausea and vomiting after your treatment, we encourage you to take your nausea medication as directed.  BELOW ARE SYMPTOMS THAT SHOULD BE REPORTED IMMEDIATELY: *FEVER GREATER THAN 100.4 F (38 C) OR HIGHER *CHILLS OR SWEATING *NAUSEA AND VOMITING THAT IS NOT CONTROLLED WITH YOUR NAUSEA MEDICATION *UNUSUAL SHORTNESS OF BREATH *UNUSUAL BRUISING OR BLEEDING *URINARY PROBLEMS (pain or burning when urinating, or frequent urination) *BOWEL PROBLEMS (unusual diarrhea, constipation, pain near the anus) TENDERNESS IN MOUTH AND THROAT WITH OR WITHOUT PRESENCE OF ULCERS (sore throat, sores in mouth, or a toothache) UNUSUAL RASH, SWELLING OR PAIN  UNUSUAL VAGINAL DISCHARGE OR ITCHING   Items with * indicate a potential emergency and should be followed up as soon as possible or go to the Emergency Department if any problems should occur.  Please show the CHEMOTHERAPY ALERT CARD or IMMUNOTHERAPY  ALERT CARD at check-in to the Emergency Department and triage nurse.  Should you have questions after your visit or need to cancel or reschedule your appointment, please contact [facility] - A DEPT OF [facility]  [phone] and follow the prompts.  Office hours are 8:00 a.m. to 4:30 p.m. Monday - Friday. Please note that voicemails left after 4:00 p.m. may not be returned until the following business day.  We are closed weekends and major holidays. You have access to a nurse at all times for urgent questions. Please call the main number to the clinic [phone] and follow the prompts.  For any non-urgent questions, you may also contact your provider using MyChart. We now offer e-Visits for anyone 3 and older to request care online for non-urgent symptoms. For details visit [email or web address].   Also download the MyChart app! Go to the app store, search "MyChart", open the app, select , and log in with your MyChart username and password.

## 2023-05-26 ENCOUNTER — Encounter: Payer: Self-pay | Admitting: Oncology

## 2023-05-26 ENCOUNTER — Other Ambulatory Visit: Payer: Self-pay | Admitting: Oncology

## 2023-05-26 ENCOUNTER — Inpatient Hospital Stay: Payer: Managed Care, Other (non HMO)

## 2023-05-26 ENCOUNTER — Other Ambulatory Visit: Payer: Self-pay

## 2023-05-26 ENCOUNTER — Inpatient Hospital Stay (HOSPITAL_BASED_OUTPATIENT_CLINIC_OR_DEPARTMENT_OTHER): Payer: Managed Care, Other (non HMO) | Admitting: Oncology

## 2023-05-26 ENCOUNTER — Other Ambulatory Visit: Payer: Self-pay | Admitting: *Deleted

## 2023-05-26 VITALS — BP 138/81 | HR 88 | Temp 97.6°F | Resp 20 | Ht 62.0 in | Wt 169.5 lb

## 2023-05-26 DIAGNOSIS — Z79899 Other long term (current) drug therapy: Secondary | ICD-10-CM | POA: Diagnosis not present

## 2023-05-26 DIAGNOSIS — G893 Neoplasm related pain (acute) (chronic): Secondary | ICD-10-CM

## 2023-05-26 DIAGNOSIS — C9000 Multiple myeloma not having achieved remission: Secondary | ICD-10-CM

## 2023-05-26 DIAGNOSIS — C9001 Multiple myeloma in remission: Secondary | ICD-10-CM | POA: Diagnosis not present

## 2023-05-26 DIAGNOSIS — Z5112 Encounter for antineoplastic immunotherapy: Secondary | ICD-10-CM | POA: Diagnosis not present

## 2023-05-26 DIAGNOSIS — D509 Iron deficiency anemia, unspecified: Secondary | ICD-10-CM

## 2023-05-26 DIAGNOSIS — Z5111 Encounter for antineoplastic chemotherapy: Secondary | ICD-10-CM

## 2023-05-26 LAB — CBC WITH DIFFERENTIAL (CANCER CENTER ONLY)
Abs Immature Granulocytes: 0.02 10*3/uL (ref 0.00–0.07)
Basophils Absolute: 0 10*3/uL (ref 0.0–0.1)
Basophils Relative: 1 %
Eosinophils Absolute: 0.3 10*3/uL (ref 0.0–0.5)
Eosinophils Relative: 6 %
HCT: 33.2 % — ABNORMAL LOW (ref 36.0–46.0)
Hemoglobin: 10.7 g/dL — ABNORMAL LOW (ref 12.0–15.0)
Immature Granulocytes: 0 %
Lymphocytes Relative: 25 %
Lymphs Abs: 1.2 10*3/uL (ref 0.7–4.0)
MCH: 29 pg (ref 26.0–34.0)
MCHC: 32.2 g/dL (ref 30.0–36.0)
MCV: 90 fL (ref 80.0–100.0)
Monocytes Absolute: 0.9 10*3/uL (ref 0.1–1.0)
Monocytes Relative: 18 %
Neutro Abs: 2.4 10*3/uL (ref 1.7–7.7)
Neutrophils Relative %: 50 %
Platelet Count: 160 10*3/uL (ref 150–400)
RBC: 3.69 MIL/uL — ABNORMAL LOW (ref 3.87–5.11)
RDW: 13.6 % (ref 11.5–15.5)
WBC Count: 4.7 10*3/uL (ref 4.0–10.5)
nRBC: 0 % (ref 0.0–0.2)

## 2023-05-26 LAB — BASIC METABOLIC PANEL
Anion gap: 10 (ref 5–15)
BUN: 14 mg/dL (ref 6–20)
CO2: 22 mmol/L (ref 22–32)
Calcium: 6.7 mg/dL — ABNORMAL LOW (ref 8.9–10.3)
Chloride: 103 mmol/L (ref 98–111)
Creatinine, Ser: 0.84 mg/dL (ref 0.44–1.00)
GFR, Estimated: >60 mL/min (ref >60)
Glucose, Bld: 178 mg/dL — ABNORMAL HIGH (ref 70–99)
Potassium: 3.7 mmol/L (ref 3.5–5.1)
Sodium: 135 mmol/L (ref 135–145)

## 2023-05-26 LAB — MULTIPLE MYELOMA PANEL, SERUM
Albumin SerPl Elph-Mcnc: 3.4 g/dL (ref 2.9–4.4)
Albumin/Glob SerPl: 1.1 (ref 0.7–1.7)
Alpha 1: 0.3 g/dL (ref 0.0–0.4)
Alpha2 Glob SerPl Elph-Mcnc: 0.8 g/dL (ref 0.4–1.0)
B-Globulin SerPl Elph-Mcnc: 1.4 g/dL — ABNORMAL HIGH (ref 0.7–1.3)
Gamma Glob SerPl Elph-Mcnc: 0.5 g/dL (ref 0.4–1.8)
Globulin, Total: 3.1 g/dL (ref 2.2–3.9)
IgA: 41 mg/dL — ABNORMAL LOW (ref 87–352)
IgG (Immunoglobin G), Serum: 1203 mg/dL (ref 586–1602)
IgM (Immunoglobulin M), Srm: 48 mg/dL (ref 26–217)
M Protein SerPl Elph-Mcnc: 0.5 g/dL — ABNORMAL HIGH
Total Protein ELP: 6.5 g/dL (ref 6.0–8.5)

## 2023-05-26 LAB — ALBUMIN: Albumin: 3.3 g/dL — ABNORMAL LOW (ref 3.5–5.0)

## 2023-05-26 MED ORDER — SODIUM CHLORIDE 0.9 % IV SOLN: 2.0000 g | Freq: Once | INTRAVENOUS | Status: DC

## 2023-05-26 MED ORDER — SODIUM CHLORIDE 0.9 % IV SOLN
2.0000 g | Freq: Once | INTRAVENOUS | Status: AC
Start: 2023-05-26 — End: 2023-05-26
  Administered 2023-05-26: 2 g via INTRAVENOUS
  Filled 2023-05-26: qty 20

## 2023-05-26 MED ORDER — SODIUM CHLORIDE 0.9 % IV SOLN
INTRAVENOUS | Status: DC | PRN
  Filled 2023-05-26: qty 250

## 2023-05-26 MED ORDER — DEXAMETHASONE 4 MG PO TABS
20.0000 mg | ORAL_TABLET | Freq: Once | ORAL | Status: AC
  Administered 2023-05-26: 20 mg via ORAL
  Filled 2023-05-26: qty 5

## 2023-05-26 MED ORDER — DIPHENHYDRAMINE HCL 25 MG PO CAPS
50.0000 mg | ORAL_CAPSULE | Freq: Once | ORAL | Status: AC
  Administered 2023-05-26: 50 mg via ORAL
  Filled 2023-05-26: qty 2

## 2023-05-26 MED ORDER — DARATUMUMAB-HYALURONIDASE-FIHJ 1800-30000 MG-UT/15ML ~~LOC~~ SOLN
1800.0000 mg | Freq: Once | SUBCUTANEOUS | Status: AC
Start: 2023-05-26 — End: 2023-05-26
  Administered 2023-05-26: 1800 mg via SUBCUTANEOUS
  Filled 2023-05-26: qty 15

## 2023-05-26 MED ORDER — AMOXICILLIN-POT CLAVULANATE 875-125 MG PO TABS: 1.0000 | ORAL_TABLET | Freq: Two times a day (BID) | ORAL | 0 refills | Status: DC

## 2023-05-26 MED ORDER — ACETAMINOPHEN 325 MG PO TABS
650.0000 mg | ORAL_TABLET | Freq: Once | ORAL | Status: AC
Start: 2023-05-26 — End: 2023-05-26
  Administered 2023-05-26: 650 mg via ORAL
  Filled 2023-05-26: qty 2

## 2023-05-26 NOTE — Progress Notes (Signed)
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 She was last seen by me in April 2024 and at that time she had complained of mild left chest wall pain radiating to her back.  She underwent cardiac workup and I had done a bone survey at that time which did not show any evidence of bone disease.  Her smoldering multiple myeloma at that time was stable.  Subsequently she has had chest x-ray in May 2024 as well as a rib x-ray in July 2024.  None of these showed any abnormalities.  Her pain has gradually worsened in the last 2 months but especially so in the last 1 week.  She presented to the ER with symptoms of pleuritic chest wall pain and shortness of breath and underwent a CT angio chest which showed a soft tissue mass with bone destruction involving the left seventh rib as well as T6 and T7 vertebral bodies and central canal.  Possibility of cord compression.  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   Results of  myeloma work-up from 06/20/2020 showed an elevated IgG of 3668.  M protein was elevated at 3.1 and IFE showed IgG monoclonal lambda protein.  Beta-2 microglobulin and LDH were normal.  Serum free light chain ratio was elevated at 25 with a free light chain lambda of 303.  Bone marrow biopsy showed increased plasma cells 22% by manual count and 20% by IHC staining for CD138.  Cytogenetics for myeloma showed gain of 1 q.   MRI cervical and lumbar thoracic spine did not show any evidence of lytic lesions.  Bone survey was negative for bone lesions as well.  MRI pelvis with and without contrast showed diffuse patchy heterogeneous marrow concerning for myeloma    Patient had a repeat bone marrow biopsy inAugust 2024 which showed 20% overall plasma cells and hypercellular marrow consistent with plasma cell myeloma.  Cytogenetics normal and FISH studies did not show any abnormalities.  Patient had stabilization procedure/spinal fusion T4-T9 with T6-T7 laminotomies.  Spinal mass biopsy was consistent with plasma cell neoplasm. PET CT scan showed solitary hypermetabolic lytic lesion in the L4 vertebral body consistent with multiple myeloma.  Residual soft tissue density at the left paraspinal resection site T6-T7 with no significant metabolic activity.   Plan was to do outpatient daratumumab Revlimid Velcade dexamethasone regimen.  However patient was found to be in acute kidney failure with a creatinine of 5 in August 2024 and was sent to the ER.  She received cycle 1 of CyBorD in the hospital  with improvement in her creatinine with stabilized between 1.5-1.7.  Treatment has been complicated by repeated hospitalizations for acute hypoxic respiratory failure and left pleural effusion which has been treated with antibiotics and thoracentesis.    Subsequently patient's clinical condition improved and she started D VRD regimen as per Valentina Lucks protocol on 03/31/2023  Interval history-reports having some nasal congestion as  well as occasional cough with yellowish sputum production.  Denies any fever or shortness of breath.  Pain is currently well-controlled with as needed Dilaudid  ECOG PS- 1 Pain scale- 2 Opioid associated constipation- no  Review of systems- Review of Systems  HENT:  Positive for congestion.   Respiratory:  Positive for cough and sputum production.       Allergies  Allergen Reactions   Hctz [Hydrochlorothiazide]     Hyponatremia      Past Medical History:  Diagnosis Date   Anxiety    Arthritis    Asthma    Barrett's esophagus 2015   COVID-19    03/10/20   Depression    GERD (gastroesophageal reflux disease)    Hypertension    Hypothyroidism    Pneumonia    Smoldering myeloma    Thyroid disease      Past Surgical History:  Procedure Laterality Date   ABLATION  2006   APPLICATION OF INTRAOPERATIVE CT SCAN N/A 01/28/2023   Procedure: APPLICATION OF INTRAOPERATIVE CT SCAN;  Surgeon: Loreen Freud, MD;  Location: [facility] ORS;  Service: Neurosurgery;  Laterality: N/A;   BREAST CYST ASPIRATION Left 1998   BREAST SURGERY     cyst aspiration   BREAST SURGERY  1998   cyst aspiration   COLONOSCOPY  03/2014   COLONOSCOPY WITH PROPOFOL N/A 01/06/2020   Procedure: COLONOSCOPY WITH PROPOFOL;  Surgeon: Toledo, Boykin Nearing, MD;  Location: [facility] ENDOSCOPY;  Service: Gastroenterology;  Laterality: N/A;   ENDOMETRIAL ABLATION     ESOPHAGOGASTRODUODENOSCOPY (EGD) WITH PROPOFOL N/A 08/15/2015   Procedure: ESOPHAGOGASTRODUODENOSCOPY (EGD) WITH PROPOFOL;  Surgeon: Christena Deem, MD;  Location: [facility] ENDOSCOPY;  Service: Endoscopy;  Laterality: N/A;   ESOPHAGOGASTRODUODENOSCOPY (EGD) WITH PROPOFOL N/A 01/22/2016   Procedure: ESOPHAGOGASTRODUODENOSCOPY (EGD) WITH PROPOFOL;  Surgeon: Christena Deem, MD;  Location: [facility] ENDOSCOPY;  Service: Endoscopy;  Laterality: N/A;   ESOPHAGOGASTRODUODENOSCOPY (EGD) WITH PROPOFOL N/A 01/06/2020   Procedure: ESOPHAGOGASTRODUODENOSCOPY (EGD) WITH  PROPOFOL;  Surgeon: Toledo, Boykin Nearing, MD;  Location: [facility] ENDOSCOPY;  Service: Gastroenterology;  Laterality: N/A;   FLEXIBLE BRONCHOSCOPY Bilateral 03/13/2023   Procedure: FLEXIBLE BRONCHOSCOPY;  Surgeon: Erin Fulling, MD;  Location: [facility] ORS;  Service: Cardiopulmonary;  Laterality: Bilateral;   GASTRIC BYPASS  2005   HERNIA REPAIR     IR BONE MARROW BIOPSY & ASPIRATION  02/05/2023   NASAL SINUS SURGERY     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   TOTAL THYROIDECTOMY     TUBAL LIGATION      Social History   Socioeconomic History   Marital status: Divorced    Spouse name: Not on file   Number of children: 2   Years of education: Not on file   Highest education level: GED or equivalent  Occupational History   Occupation: Part-time  Tobacco Use   Smoking status: Former    Types: Cigarettes   Smokeless tobacco: Never   Tobacco comments:    Quit smoking 02/16/2023  Vaping Use   Vaping status: Never Used  Substance and Sexual Activity   Alcohol use: Yes    Alcohol/week: 10.0 -  12.0 standard drinks of alcohol    Types: 8 - 10 Glasses of wine, 2 Standard drinks or equivalent per week   Drug use: No   Sexual activity: Yes    Partners: Male  Other Topics Concern   Not on file  Social History Narrative   Lives in [place] single, divorced       Work - 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       Diet - healthy diet   Exercise - limited   Caffeine use: daily   Social Determinants of Health   Financial Resource Strain: Low Risk  (11/01/2022)   Overall Financial Resource Strain (CARDIA)    Difficulty of Paying Living Expenses: Not very hard  Food Insecurity: No Food Insecurity (03/18/2023)   Hunger Vital Sign    Worried About Running Out of Food in the Last Year: Never true    Ran Out of Food in the Last Year: Never true  Transportation Needs: No Transportation Needs (03/18/2023)   PRAPARE - Administrator, Civil Service (Medical): No    Lack of Transportation  (Non-Medical): No  Physical Activity: Insufficiently Active (11/01/2022)   Exercise Vital Sign    Days of Exercise per Week: 3 days    Minutes of Exercise per Session: 20 min  Stress: Stress Concern Present (03/04/2023)   [employer] of Occupational Health - Occupational Stress Questionnaire    Feeling of Stress : Very much  Social Connections: Socially Isolated (03/04/2023)   Social Connection and Isolation Panel [NHANES]    Frequency of Communication with Friends and Family: Never    Frequency of Social Gatherings with Friends and Family: Never    Attends Religious Services: Never    Database administrator or Organizations: No    Attends Banker Meetings: Never    Marital Status: Divorced  Catering manager Violence: Not At Risk (03/06/2023)   Humiliation, Afraid, Rape, and Kick questionnaire    Fear of Current or Ex-Partner: No    Emotionally Abused: No    Physically Abused: No    Sexually Abused: No    Family History  Problem Relation Age of Onset   Heart disease Mother    COPD Mother    Arthritis Mother    Cancer Mother        uterine   Lupus Mother    Anxiety disorder Mother    Depression Mother    Drug abuse Mother    COPD Father    Arthritis Father    Heart disease Father    Heart attack Father    Alcohol abuse Father    Heart disease Brother    Heart attack Brother    Drug abuse Brother    Anxiety disorder Brother    Depression Brother    Heart disease Brother    Cancer Brother        liver cancer age 78 y.o   Diabetes Maternal Grandmother    Diabetes Maternal Grandfather    Diabetes Paternal Grandmother    Diabetes Paternal Grandfather    Breast cancer Neg Hx      Current Outpatient Medications:    acyclovir (ZOVIRAX) 400 MG tablet, Take 1 tablet (400 mg total) by mouth 2 (two) times daily., Disp: 60 tablet, Rfl: 5   amLODipine (NORVASC) 10 MG tablet, Take 1 tablet (10 mg total) by mouth daily., Disp: 90 tablet, Rfl: 3   aspirin EC 81  MG tablet, Take 1 tablet (81 mg total)  by mouth daily., Disp: 100 tablet, Rfl: 3   butalbital-acetaminophen-caffeine (FIORICET) 50-325-40 MG tablet, TAKE 1 TABLET BY MOUTH EVERY 4 HOURS AS NEEDED FOR HEADACHE, Disp: 14 tablet, Rfl: 3   Cholecalciferol (VITAMIN D3) 50 MCG (2000 UT) CAPS, Take by mouth., Disp: , Rfl:    clonazePAM (KLONOPIN) 1 MG tablet, TAKE 1 TABLET(1 MG) BY MOUTH TWICE DAILY, Disp: 60 tablet, Rfl: 2   cyanocobalamin (VITAMIN B12) 1000 MCG/ML injection, INJECT 1 ML IN THE MUSCLE EVERY 30 DAYS, Disp: 4 mL, Rfl: 12   cyclobenzaprine (FLEXERIL) 10 MG tablet, Take 0.5-1 tablets (5-10 mg total) by mouth 3 (three) times daily as needed for muscle spasms., Disp: 60 tablet, Rfl: 1   dexamethasone (DECADRON) 4 MG tablet, Take 5 tabs (20 mg) weekly the day after daratumumab for 12 weeks. Take with breakfast., Disp: 20 tablet, Rfl: 5   docusate sodium (COLACE) 100 MG capsule, Take 1 capsule (100 mg total) by mouth 2 (two) times daily., Disp: 10 capsule, Rfl: 0   escitalopram (LEXAPRO) 10 MG tablet, Take 1 tablet (10 mg total) by mouth daily., Disp: 30 tablet, Rfl: 2   HYDROmorphone (DILAUDID) 2 MG tablet, Take 2 tablets (4 mg total) by mouth every 6 (six) hours as needed for severe pain (pain score 7-10)., Disp: 112 tablet, Rfl: 0   ipratropium-albuterol (DUONEB) 0.5-2.5 (3) MG/3ML SOLN, Take 3 mLs by nebulization 4 (four) times daily., Disp: 360 mL, Rfl: 0   lenalidomide (REVLIMID) 20 MG capsule, Take 1 capsule (20 mg total) by mouth daily. Take for 14 days, then hold for 7 days. Repeat every 21 days., Disp: 14 capsule, Rfl: 0   levothyroxine (SYNTHROID) 125 MCG tablet, Take 2 tablets (250 mcg total) by mouth daily before breakfast., Disp: 60 tablet, Rfl: 0   lidocaine-prilocaine (EMLA) cream, Apply 1 Application topically once., Disp: , Rfl:    ondansetron (ZOFRAN) 8 MG tablet, Take 1 tablet (8 mg total) by mouth every 8 (eight) hours as needed for nausea or vomiting., Disp: 30 tablet, Rfl: 1    pantoprazole (PROTONIX) 40 MG tablet, 30 min before lunch or dinner, Disp: 90 tablet, Rfl: 3   polyethylene glycol (MIRALAX / GLYCOLAX) 17 g packet, Take 17 g by mouth 2 (two) times daily., Disp: 14 each, Rfl: 0   prochlorperazine (COMPAZINE) 10 MG tablet, Take 1 tablet (10 mg total) by mouth every 6 (six) hours as needed for nausea or vomiting., Disp: 30 tablet, Rfl: 1   senna-docusate (SENOKOT-S) 8.6-50 MG tablet, Take 1 tablet by mouth 2 (two) times daily., Disp: 30 tablet, Rfl: 0   Spacer/Aero-Holding Chambers (AEROCHAMBER MV) inhaler, Use as instructed, Disp: 1 each, Rfl: 0   SYRINGE-NEEDLE, DISP, 3 ML 25G X 1-1/2" 3 ML MISC, 1 Device by Does not apply route every 30 (thirty) days. With B12 shot, Disp: 12 each, Rfl: 1   thiamine (VITAMIN B-1) 100 MG tablet, Take 1 tablet (100 mg total) by mouth daily., Disp: 30 tablet, Rfl: 0   zolpidem (AMBIEN) 10 MG tablet, TAKE 1/2 TO 1 TABLET(5 TO 10 MG) BY MOUTH AT BEDTIME AS NEEDED FOR SLEEP, Disp: 30 tablet, Rfl: 2  Physical exam:  Vitals:   05/26/23 0857  BP: (!) 162/69  Pulse: 86  Resp: 20  Temp: 97.6 F (36.4 C)  TempSrc: Tympanic  Weight: 169 lb 8 oz (76.9 kg)  Height: 5\' 2"  (1.575 m)   Physical Exam Cardiovascular:     Rate and Rhythm: Normal rate and regular rhythm.  Heart sounds: Normal heart sounds.  Pulmonary:     Effort: Pulmonary effort is normal.     Breath sounds: Normal breath sounds.  Skin:    General: Skin is warm and dry.  Neurological:     Mental Status: She is alert and oriented to person, place, and time.         Latest Ref Rng & Units 05/19/2023    9:17 AM  CMP  Glucose 70 - 99 mg/dL 119   BUN 6 - 20 mg/dL 15   Creatinine 1.47 - 1.00 mg/dL 8.29   Sodium 562 - 130 mmol/L 137   Potassium 3.5 - 5.1 mmol/L 3.4   Chloride 98 - 111 mmol/L 103   CO2 22 - 32 mmol/L 24   Calcium 8.9 - 10.3 mg/dL 7.6   Total Protein 6.5 - 8.1 g/dL 6.2   Total Bilirubin <8.6 mg/dL 0.5   Alkaline Phos 38 - 126 U/L 86   AST 15  - 41 U/L 16   ALT 0 - 44 U/L 12       Latest Ref Rng & Units 05/26/2023    8:45 AM  CBC  WBC 4.0 - 10.5 K/uL 4.7   Hemoglobin 12.0 - 15.0 g/dL 57.8   Hematocrit 46.9 - 46.0 % 33.2   Platelets 150 - 400 K/uL 160      Assessment and plan- Patient is a 57 y.o. female  with standard risk IgG lambda multiple myeloma R-ISS stage II.  She is here for on treatment assessment prior to cycle 3-day 15 of DRVD treatment  Counts okay to proceed with cycle 3-day 15 of Darzalex today.  She completed Revlimid 20 mg day 1 to day 14 for cycle 3.  She is receiving Velcade 1.3 mg/m twice a week.  She will proceed for cycle 4-day 1 of treatment next week and I will see her back in 2 weeks for cycle 4-day 15.  Patient has been seen by the transplant team at [facility] and will undergo further workup for transplant there.  Depending on when they tell us to stop her treatments here she will likely get a 1 month break before she proceeds with autoLog a stem cell transplantation.  She remains in partial remission asFar as her myeloma is concerned.  Her last myeloma panel showed 0.8 g of IgG lambda M protein.  Serum free light chain levels have normalized.  Patient will continue low-dose aspirin and acyclovir prophylaxis.  Patient was supposed to receive Xgeva today but her corrected calcium is 7.2.  I have asked her to take oral calcium and we will be giving her 2 g of IV calcium today.  She remains asymptomatic.  Possible Xgeva in 1 week depending on her calcium levels.  Neoplasm related pain: Continue as needed Dilaudid   Visit Diagnosis 1. Encounter for antineoplastic chemotherapy   2. High risk medication use   3. Multiple myeloma in remission (HCC)   4. Multiple myeloma not having achieved remission (HCC)   5. Hypocalcemia   6. Neoplasm related pain      Dr. Owens Shark, MD, MPH [facility] [phone] 05/26/2023 8:58 AM

## 2023-05-26 NOTE — Patient Instructions (Signed)

## 2023-05-30 ENCOUNTER — Other Ambulatory Visit: Payer: Self-pay | Admitting: Family Medicine

## 2023-05-30 DIAGNOSIS — F5104 Psychophysiologic insomnia: Secondary | ICD-10-CM

## 2023-05-30 DIAGNOSIS — F419 Anxiety disorder, unspecified: Secondary | ICD-10-CM

## 2023-06-02 ENCOUNTER — Inpatient Hospital Stay: Payer: Managed Care, Other (non HMO)

## 2023-06-02 VITALS — BP 167/74 | HR 84 | Temp 97.0°F | Resp 18 | Wt 171.6 lb

## 2023-06-02 DIAGNOSIS — C9000 Multiple myeloma not having achieved remission: Secondary | ICD-10-CM

## 2023-06-02 DIAGNOSIS — Z5112 Encounter for antineoplastic immunotherapy: Secondary | ICD-10-CM | POA: Diagnosis not present

## 2023-06-02 LAB — CBC WITH DIFFERENTIAL (CANCER CENTER ONLY)
Abs Immature Granulocytes: 0.02 10*3/uL (ref 0.00–0.07)
Basophils Absolute: 0.1 10*3/uL (ref 0.0–0.1)
Basophils Relative: 1 %
Eosinophils Absolute: 0.1 10*3/uL (ref 0.0–0.5)
Eosinophils Relative: 1 %
HCT: 34.6 % — ABNORMAL LOW (ref 36.0–46.0)
Hemoglobin: 11.3 g/dL — ABNORMAL LOW (ref 12.0–15.0)
Immature Granulocytes: 0 %
Lymphocytes Relative: 31 %
Lymphs Abs: 1.8 10*3/uL (ref 0.7–4.0)
MCH: 28.7 pg (ref 26.0–34.0)
MCHC: 32.7 g/dL (ref 30.0–36.0)
MCV: 87.8 fL (ref 80.0–100.0)
Monocytes Absolute: 0.7 10*3/uL (ref 0.1–1.0)
Monocytes Relative: 12 %
Neutro Abs: 3.2 10*3/uL (ref 1.7–7.7)
Neutrophils Relative %: 55 %
Platelet Count: 316 10*3/uL (ref 150–400)
RBC: 3.94 MIL/uL (ref 3.87–5.11)
RDW: 13.6 % (ref 11.5–15.5)
WBC Count: 5.9 10*3/uL (ref 4.0–10.5)
nRBC: 0 % (ref 0.0–0.2)

## 2023-06-02 LAB — CMP (CANCER CENTER ONLY)
ALT: 16 U/L (ref 0–44)
AST: 16 U/L (ref 15–41)
Albumin: 3.5 g/dL (ref 3.5–5.0)
Alkaline Phosphatase: 80 U/L (ref 38–126)
Anion gap: 10 (ref 5–15)
BUN: 12 mg/dL (ref 6–20)
CO2: 23 mmol/L (ref 22–32)
Calcium: 6.6 mg/dL — ABNORMAL LOW (ref 8.9–10.3)
Chloride: 106 mmol/L (ref 98–111)
Creatinine: 0.81 mg/dL (ref 0.44–1.00)
GFR, Estimated: >60 mL/min (ref >60)
Glucose, Bld: 93 mg/dL (ref 70–99)
Potassium: 3.8 mmol/L (ref 3.5–5.1)
Sodium: 139 mmol/L (ref 135–145)
Total Bilirubin: 0.5 mg/dL (ref <1.2)
Total Protein: 6.7 g/dL (ref 6.5–8.1)

## 2023-06-02 MED ORDER — BORTEZOMIB CHEMO SQ INJECTION 3.5 MG (2.5MG/ML)
1.3000 mg/m2 | Freq: Once | INTRAMUSCULAR | Status: AC
Start: 2023-06-02 — End: 2023-06-02
  Administered 2023-06-02: 2.25 mg via SUBCUTANEOUS
  Filled 2023-06-02: qty 0.9

## 2023-06-02 MED ORDER — ACETAMINOPHEN 325 MG PO TABS
650.0000 mg | ORAL_TABLET | Freq: Once | ORAL | Status: AC
Start: 2023-06-02 — End: 2023-06-02
  Administered 2023-06-02: 650 mg via ORAL
  Filled 2023-06-02: qty 2

## 2023-06-02 MED ORDER — DEXAMETHASONE 4 MG PO TABS
20.0000 mg | ORAL_TABLET | Freq: Once | ORAL | Status: AC
  Administered 2023-06-02: 20 mg via ORAL
  Filled 2023-06-02: qty 5

## 2023-06-02 MED ORDER — DIPHENHYDRAMINE HCL 25 MG PO CAPS
50.0000 mg | ORAL_CAPSULE | Freq: Once | ORAL | Status: AC
Start: 2023-06-02 — End: 2023-06-02
  Administered 2023-06-02: 50 mg via ORAL
  Filled 2023-06-02: qty 2

## 2023-06-02 MED ORDER — DARATUMUMAB-HYALURONIDASE-FIHJ 1800-30000 MG-UT/15ML ~~LOC~~ SOLN
1800.0000 mg | Freq: Once | SUBCUTANEOUS | Status: AC
Start: 2023-06-02 — End: 2023-06-02
  Administered 2023-06-02: 1800 mg via SUBCUTANEOUS
  Filled 2023-06-02: qty 15

## 2023-06-02 NOTE — Telephone Encounter (Signed)
Requested medication (s) are due for refill today: Due 06/03/23  Requested medication (s) are on the active medication list: yes    Last refill: Ambien  03/04/23  #30  2 refills  Clonazepam  03/04/23  #60  2 refills  Future visit scheduled No  Notes to clinic:Not delegated, please review. Thank you.  Requested Prescriptions  Pending Prescriptions Disp Refills   zolpidem (AMBIEN) 10 MG tablet [Pharmacy Med Name: ZOLPIDEM 10MG  TABLETS] 30 tablet     Sig: TAKE 1/2 TO 1 TABLET(5 TO 10 MG) BY MOUTH AT BEDTIME AS NEEDED FOR SLEEP     Not Delegated - Psychiatry:  Anxiolytics/Hypnotics Failed - 06/02/2023  7:38 AM      Failed - This refill cannot be delegated      Passed - Urine Drug Screen completed in last 360 days      Passed - Valid encounter within last 6 months    Recent Outpatient Visits           4 weeks ago Benign essential hypertension   [place] [facility] [place], Netta Neat, DO   5 months ago MDD (major depressive disorder), recurrent episode, moderate [facility])   [place] [facility] [place], Netta Neat, DO   6 months ago Benign essential hypertension   [place] [facility] [place], Netta Neat, DO               clonazePAM (KLONOPIN) 1 MG tablet [Pharmacy Med Name: CLONAZEPAM 1MG  TABLETS] 60 tablet     Sig: TAKE 1 TABLET(1 MG) BY MOUTH TWICE DAILY     Not Delegated - Psychiatry: Anxiolytics/Hypnotics 2 Failed - 06/02/2023  7:38 AM      Failed - This refill cannot be delegated      Passed - Urine Drug Screen completed in last 360 days      Passed - Patient is not pregnant      Passed - Valid encounter within last 6 months    Recent Outpatient Visits           4 weeks ago Benign essential hypertension   [place] [facility] [place], Netta Neat, DO   5 months ago MDD (major depressive disorder), recurrent episode, moderate [facility])   [place] [facility]  [place], Netta Neat, DO   6 months ago Benign essential hypertension   [place] [facility] [place], Netta Neat, [place]

## 2023-06-03 ENCOUNTER — Telehealth: Payer: Self-pay

## 2023-06-03 DIAGNOSIS — Z1211 Encounter for screening for malignant neoplasm of colon: Secondary | ICD-10-CM

## 2023-06-03 NOTE — Telephone Encounter (Signed)
Please notify patient that Cologuard has been ordered.  I spoke with Dr Smith Robert and confirmed that she needs cologuard as a part of her pre bone marrow transplant evaluation at [facility].  ---------  Here is info on the Cologuard if she has questions.  It will be shipped to the patient directly. If not received in 2-4 weeks, call us or the company.   If she sends it back and no results are received in 2-4 weeks, call us or the company as well.   - If Cologuard is NEGATIVE, then it is good for 3 years before next due - If Cologuard is POSITIVE, then it is strongly advised to get a Colonoscopy  Follow instructions to collect sample, you may call the company for any help or questions, 24/7 telephone support at [phone].  Saralyn Pilar, DO [facility] 06/03/2023, 11:17 AM

## 2023-06-03 NOTE — Telephone Encounter (Signed)
Copied from CRM [id number]. Topic: General - Other >> Jun 02, 2023  1:14 PM Phill Myron wrote: Dr Smith Robert is recommending (but can not order) that patient Paula Garrett have a Cologard study.

## 2023-06-05 ENCOUNTER — Inpatient Hospital Stay: Payer: Managed Care, Other (non HMO)

## 2023-06-05 VITALS — BP 155/81 | HR 83 | Temp 97.2°F | Resp 18

## 2023-06-05 DIAGNOSIS — Z5112 Encounter for antineoplastic immunotherapy: Secondary | ICD-10-CM | POA: Diagnosis not present

## 2023-06-05 DIAGNOSIS — C9000 Multiple myeloma not having achieved remission: Secondary | ICD-10-CM

## 2023-06-05 MED ORDER — BORTEZOMIB CHEMO SQ INJECTION 3.5 MG (2.5MG/ML)
1.3000 mg/m2 | Freq: Once | INTRAMUSCULAR | Status: AC
Start: 2023-06-05 — End: 2023-06-05
  Administered 2023-06-05: 2.25 mg via SUBCUTANEOUS
  Filled 2023-06-05: qty 0.9

## 2023-06-05 MED ORDER — PROCHLORPERAZINE MALEATE 10 MG PO TABS
10.0000 mg | ORAL_TABLET | Freq: Once | ORAL | Status: AC
  Administered 2023-06-05: 10 mg via ORAL
  Filled 2023-06-05: qty 1

## 2023-06-09 ENCOUNTER — Encounter: Payer: Self-pay | Admitting: Oncology

## 2023-06-09 ENCOUNTER — Inpatient Hospital Stay: Payer: Managed Care, Other (non HMO)

## 2023-06-09 ENCOUNTER — Inpatient Hospital Stay (HOSPITAL_BASED_OUTPATIENT_CLINIC_OR_DEPARTMENT_OTHER): Payer: Managed Care, Other (non HMO) | Admitting: Oncology

## 2023-06-09 VITALS — BP 119/93 | HR 79 | Temp 97.4°F | Resp 18 | Wt 176.1 lb

## 2023-06-09 DIAGNOSIS — Z5111 Encounter for antineoplastic chemotherapy: Secondary | ICD-10-CM

## 2023-06-09 DIAGNOSIS — C9000 Multiple myeloma not having achieved remission: Secondary | ICD-10-CM

## 2023-06-09 DIAGNOSIS — C9001 Multiple myeloma in remission: Secondary | ICD-10-CM

## 2023-06-09 DIAGNOSIS — Z79899 Other long term (current) drug therapy: Secondary | ICD-10-CM

## 2023-06-09 DIAGNOSIS — Z5112 Encounter for antineoplastic immunotherapy: Secondary | ICD-10-CM | POA: Diagnosis not present

## 2023-06-09 DIAGNOSIS — G893 Neoplasm related pain (acute) (chronic): Secondary | ICD-10-CM

## 2023-06-09 LAB — CMP (CANCER CENTER ONLY)
ALT: 15 U/L (ref 0–44)
AST: 17 U/L (ref 15–41)
Albumin: 3.7 g/dL (ref 3.5–5.0)
Alkaline Phosphatase: 79 U/L (ref 38–126)
Anion gap: 10 (ref 5–15)
BUN: 18 mg/dL (ref 6–20)
CO2: 23 mmol/L (ref 22–32)
Calcium: 7.8 mg/dL — ABNORMAL LOW (ref 8.9–10.3)
Chloride: 103 mmol/L (ref 98–111)
Creatinine: 0.9 mg/dL (ref 0.44–1.00)
GFR, Estimated: >60 mL/min (ref >60)
Glucose, Bld: 149 mg/dL — ABNORMAL HIGH (ref 70–99)
Potassium: 3.7 mmol/L (ref 3.5–5.1)
Sodium: 136 mmol/L (ref 135–145)
Total Bilirubin: 0.4 mg/dL (ref <1.2)
Total Protein: 6.9 g/dL (ref 6.5–8.1)

## 2023-06-09 LAB — CBC WITH DIFFERENTIAL (CANCER CENTER ONLY)
Abs Immature Granulocytes: 0.01 10*3/uL (ref 0.00–0.07)
Basophils Absolute: 0.1 10*3/uL (ref 0.0–0.1)
Basophils Relative: 2 %
Eosinophils Absolute: 0.3 10*3/uL (ref 0.0–0.5)
Eosinophils Relative: 7 %
HCT: 34.8 % — ABNORMAL LOW (ref 36.0–46.0)
Hemoglobin: 11.1 g/dL — ABNORMAL LOW (ref 12.0–15.0)
Immature Granulocytes: 0 %
Lymphocytes Relative: 34 %
Lymphs Abs: 1.4 10*3/uL (ref 0.7–4.0)
MCH: 28.6 pg (ref 26.0–34.0)
MCHC: 31.9 g/dL (ref 30.0–36.0)
MCV: 89.7 fL (ref 80.0–100.0)
Monocytes Absolute: 0.3 10*3/uL (ref 0.1–1.0)
Monocytes Relative: 8 %
Neutro Abs: 2 10*3/uL (ref 1.7–7.7)
Neutrophils Relative %: 49 %
Platelet Count: 233 10*3/uL (ref 150–400)
RBC: 3.88 MIL/uL (ref 3.87–5.11)
RDW: 14.6 % (ref 11.5–15.5)
WBC Count: 4.1 10*3/uL (ref 4.0–10.5)
nRBC: 0 % (ref 0.0–0.2)

## 2023-06-09 MED ORDER — DARATUMUMAB-HYALURONIDASE-FIHJ 1800-30000 MG-UT/15ML ~~LOC~~ SOLN
1800.0000 mg | Freq: Once | SUBCUTANEOUS | Status: AC
Start: 2023-06-09 — End: 2023-06-09
  Administered 2023-06-09: 1800 mg via SUBCUTANEOUS
  Filled 2023-06-09: qty 15

## 2023-06-09 MED ORDER — DEXAMETHASONE 4 MG PO TABS
20.0000 mg | ORAL_TABLET | Freq: Once | ORAL | Status: AC
Start: 2023-06-09 — End: 2023-06-09
  Administered 2023-06-09: 20 mg via ORAL
  Filled 2023-06-09: qty 5

## 2023-06-09 MED ORDER — BORTEZOMIB CHEMO SQ INJECTION 3.5 MG (2.5MG/ML)
1.3000 mg/m2 | Freq: Once | INTRAMUSCULAR | Status: AC
Start: 2023-06-09 — End: 2023-06-09
  Administered 2023-06-09: 2.25 mg via SUBCUTANEOUS
  Filled 2023-06-09: qty 0.9

## 2023-06-09 MED ORDER — HYDROMORPHONE HCL 1 MG/ML IJ SOLN
1.0000 mg | Freq: Once | INTRAMUSCULAR | Status: AC
  Administered 2023-06-09: 1 mg via INTRAVENOUS
  Filled 2023-06-09: qty 1

## 2023-06-09 MED ORDER — DIPHENHYDRAMINE HCL 25 MG PO CAPS
50.0000 mg | ORAL_CAPSULE | Freq: Once | ORAL | Status: AC
  Administered 2023-06-09: 50 mg via ORAL
  Filled 2023-06-09: qty 2

## 2023-06-09 MED ORDER — ACETAMINOPHEN 325 MG PO TABS
650.0000 mg | ORAL_TABLET | Freq: Once | ORAL | Status: AC
  Administered 2023-06-09: 650 mg via ORAL
  Filled 2023-06-09: qty 2

## 2023-06-09 NOTE — Patient Instructions (Signed)
[facility] - A DEPT OF [facility]  Discharge Instructions: Thank you for choosing [facility] to provide your oncology and hematology care.  If you have a lab appointment with the Cancer Center, please go directly to the Cancer Center and check in at the registration area.  Wear comfortable clothing and clothing appropriate for easy access to any Portacath or PICC line.   We strive to give you quality time with your provider. You may need to reschedule your appointment if you arrive late (15 or more minutes).  Arriving late affects you and other patients whose appointments are after yours.  Also, if you miss three or more appointments without notifying the office, you may be dismissed from the clinic at the provider's discretion.      For prescription refill requests, have your pharmacy contact our office and allow 72 hours for refills to be completed.    Today you received the following chemotherapy and/or immunotherapy agents Velcade & darzalex        To help prevent nausea and vomiting after your treatment, we encourage you to take your nausea medication as directed.  BELOW ARE SYMPTOMS THAT SHOULD BE REPORTED IMMEDIATELY: *FEVER GREATER THAN 100.4 F (38 C) OR HIGHER *CHILLS OR SWEATING *NAUSEA AND VOMITING THAT IS NOT CONTROLLED WITH YOUR NAUSEA MEDICATION *UNUSUAL SHORTNESS OF BREATH *UNUSUAL BRUISING OR BLEEDING *URINARY PROBLEMS (pain or burning when urinating, or frequent urination) *BOWEL PROBLEMS (unusual diarrhea, constipation, pain near the anus) TENDERNESS IN MOUTH AND THROAT WITH OR WITHOUT PRESENCE OF ULCERS (sore throat, sores in mouth, or a toothache) UNUSUAL RASH, SWELLING OR PAIN  UNUSUAL VAGINAL DISCHARGE OR ITCHING   Items with * indicate a potential emergency and should be followed up as soon as possible or go to the Emergency Department if any problems should occur.  Please show the CHEMOTHERAPY ALERT CARD or  IMMUNOTHERAPY ALERT CARD at check-in to the Emergency Department and triage nurse.  Should you have questions after your visit or need to cancel or reschedule your appointment, please contact [facility] - A DEPT OF [facility]  [phone] and follow the prompts.  Office hours are 8:00 a.m. to 4:30 p.m. Monday - Friday. Please note that voicemails left after 4:00 p.m. may not be returned until the following business day.  We are closed weekends and major holidays. You have access to a nurse at all times for urgent questions. Please call the main number to the clinic [phone] and follow the prompts.  For any non-urgent questions, you may also contact your provider using MyChart. We now offer e-Visits for anyone 38 and older to request care online for non-urgent symptoms. For details visit [email or web address].   Also download the MyChart app! Go to the app store, search "MyChart", open the app, select [place], and log in with your MyChart username and password.

## 2023-06-09 NOTE — Progress Notes (Signed)
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 She was last seen by me in April 2024 and at that time she had complained of mild left chest wall pain radiating to her back.  She underwent cardiac workup and I had done a bone survey at that time which did not show any evidence of bone disease.  Her smoldering multiple myeloma at that time was stable.  Subsequently she has had chest x-ray in May 2024 as well as a rib x-ray in July 2024.  None of these showed any abnormalities.  Her pain has gradually worsened in the last 2 months but especially so in the last 1 week.  She presented to the ER with symptoms of pleuritic chest wall pain and shortness of breath and underwent a CT angio chest which showed a soft tissue mass with bone destruction involving the left seventh rib as well as T6 and T7 vertebral bodies and central canal.  Possibility of cord compression.  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   Results of myeloma work-up from 06/20/2020 showed  an elevated IgG of 3668.  M protein was elevated at 3.1 and IFE showed IgG monoclonal lambda protein.  Beta-2 microglobulin and LDH were normal.  Serum free light chain ratio was elevated at 25 with a free light chain lambda of 303.  Bone marrow biopsy showed increased plasma cells 22% by manual count and 20% by IHC staining for CD138.  Cytogenetics for myeloma showed gain of 1 q.   MRI cervical and lumbar thoracic spine did not show any evidence of lytic lesions.  Bone survey was negative for bone lesions as well.  MRI pelvis with and without contrast showed diffuse patchy heterogeneous marrow concerning for myeloma    Patient had a repeat bone marrow biopsy inAugust 2024 which showed 20% overall plasma cells and hypercellular marrow consistent with plasma cell myeloma.  Cytogenetics normal and FISH studies did not show any abnormalities.  Patient had stabilization procedure/spinal fusion T4-T9 with T6-T7 laminotomies.  Spinal mass biopsy was consistent with plasma cell neoplasm. PET CT scan showed solitary hypermetabolic lytic lesion in the L4 vertebral body consistent with multiple myeloma.  Residual soft tissue density at the left paraspinal resection site T6-T7 with no significant metabolic activity.   Plan was to do outpatient daratumumab Revlimid Velcade dexamethasone regimen.  However patient was found to be in acute kidney failure with a creatinine of 5 in August 2024 and was sent to the ER.  She received cycle 1 of CyBorD in the hospital with improvement in her creatinine with stabilized between  1.5-1.7.  Treatment has been complicated by repeated hospitalizations for acute hypoxic respiratory failure and left pleural effusion which has been treated with antibiotics and thoracentesis.    Subsequently patient's clinical condition improved and she started D VRD regimen as per Valentina Lucks protocol on 03/31/2023    Interval history-back pain is better but she still complains of left chest wall pain for  which she uses as needed Dilaudid.  She has been using her fentanyl patch on and off.  Symptoms of congestion and cough have improved.  ECOG PS- 1 Pain scale- 3   Review of systems- Review of Systems  Constitutional:  Negative for chills, fever, malaise/fatigue and weight loss.  HENT:  Negative for congestion, ear discharge and nosebleeds.   Eyes:  Negative for blurred vision.  Respiratory:  Negative for cough, hemoptysis, sputum production, shortness of breath and wheezing.        Left chest wall pain  Cardiovascular:  Negative for chest pain, palpitations, orthopnea and claudication.  Gastrointestinal:  Negative for abdominal pain, blood in stool, constipation, diarrhea, heartburn, melena, nausea and vomiting.  Genitourinary:  Negative for dysuria, flank pain, frequency, hematuria and urgency.  Musculoskeletal:  Negative for back pain, joint pain and myalgias.  Skin:  Negative for rash.  Neurological:  Negative for dizziness, tingling, focal weakness, seizures, weakness and headaches.  Endo/Heme/Allergies:  Does not bruise/bleed easily.  Psychiatric/Behavioral:  Negative for depression and suicidal ideas. The patient does not have insomnia.       Allergies  Allergen Reactions   Hctz [Hydrochlorothiazide]     Hyponatremia      Past Medical History:  Diagnosis Date   Anxiety    Arthritis    Asthma    Barrett's esophagus 2015   COVID-19    03/10/20   Depression    GERD (gastroesophageal reflux disease)    Hypertension    Hypothyroidism    Pneumonia    Smoldering myeloma    Thyroid disease      Past Surgical History:  Procedure Laterality Date   ABLATION  2006   APPLICATION OF INTRAOPERATIVE CT SCAN N/A 01/28/2023   Procedure: APPLICATION OF INTRAOPERATIVE CT SCAN;  Surgeon: Loreen Freud, MD;  Location: [facility] ORS;  Service: Neurosurgery;  Laterality: N/A;   BREAST CYST ASPIRATION Left 1998   BREAST SURGERY     cyst aspiration   BREAST SURGERY  1998   cyst  aspiration   COLONOSCOPY  03/2014   COLONOSCOPY WITH PROPOFOL N/A 01/06/2020   Procedure: COLONOSCOPY WITH PROPOFOL;  Surgeon: Toledo, Boykin Nearing, MD;  Location: [facility] ENDOSCOPY;  Service: Gastroenterology;  Laterality: N/A;   ENDOMETRIAL ABLATION     ESOPHAGOGASTRODUODENOSCOPY (EGD) WITH PROPOFOL N/A 08/15/2015   Procedure: ESOPHAGOGASTRODUODENOSCOPY (EGD) WITH PROPOFOL;  Surgeon: Christena Deem, MD;  Location: [facility] ENDOSCOPY;  Service: Endoscopy;  Laterality: N/A;   ESOPHAGOGASTRODUODENOSCOPY (EGD) WITH PROPOFOL N/A 01/22/2016   Procedure: ESOPHAGOGASTRODUODENOSCOPY (EGD) WITH PROPOFOL;  Surgeon: Christena Deem, MD;  Location: [facility] ENDOSCOPY;  Service: Endoscopy;  Laterality: N/A;   ESOPHAGOGASTRODUODENOSCOPY (EGD) WITH PROPOFOL N/A 01/06/2020   Procedure: ESOPHAGOGASTRODUODENOSCOPY (EGD) WITH PROPOFOL;  Surgeon: Toledo, Boykin Nearing, MD;  Location: [facility] ENDOSCOPY;  Service: Gastroenterology;  Laterality: N/A;   FLEXIBLE BRONCHOSCOPY Bilateral 03/13/2023   Procedure: FLEXIBLE BRONCHOSCOPY;  Surgeon: Erin Fulling, MD;  Location: [facility] ORS;  Service: Cardiopulmonary;  Laterality: Bilateral;   GASTRIC BYPASS  2005   HERNIA REPAIR     IR BONE MARROW BIOPSY & ASPIRATION  02/05/2023   NASAL SINUS  SURGERY     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   TOTAL THYROIDECTOMY     TUBAL LIGATION      Social History   Socioeconomic History   Marital status: Divorced    Spouse name: Not on file   Number of children: 2   Years of education: Not on file   Highest education level: GED or equivalent  Occupational History   Occupation: Part-time  Tobacco Use   Smoking status: Former    Types: Cigarettes   Smokeless tobacco: Never   Tobacco comments:    Quit smoking 02/16/2023  Vaping Use   Vaping status: Never Used  Substance and Sexual Activity   Alcohol use: Yes    Alcohol/week: 10.0 - 12.0 standard drinks of alcohol    Types: 8 - 10 Glasses of wine, 2 Standard drinks or equivalent per week    Drug use: No   Sexual activity: Yes    Partners: Male  Other Topics Concern   Not on file  Social History Narrative   Lives in [place] single, divorced       Work - 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       Diet - healthy diet   Exercise - limited   Caffeine use: daily   Social Drivers of Corporate investment banker Strain: Low Risk  (11/01/2022)   Overall Financial Resource Strain (CARDIA)    Difficulty of Paying Living Expenses: Not very hard  Food Insecurity: No Food Insecurity (03/18/2023)   Hunger Vital Sign    Worried About Running Out of Food in the Last Year: Never true    Ran Out of Food in the Last Year: Never true  Transportation Needs: No Transportation Needs (03/18/2023)   PRAPARE - Administrator, Civil Service (Medical): No    Lack of Transportation (Non-Medical): No  Physical Activity: Insufficiently Active (11/01/2022)   Exercise Vital Sign    Days of Exercise per Week: 3 days    Minutes of Exercise per Session: 20 min  Stress: Stress Concern Present (03/04/2023)   [employer] of Occupational Health - Occupational Stress Questionnaire    Feeling of Stress : Very much  Social Connections: Socially Isolated (03/04/2023)   Social Connection and Isolation Panel [NHANES]    Frequency of Communication with Friends and Family: Never    Frequency of Social Gatherings with Friends and Family: Never    Attends Religious Services: Never    Database administrator or Organizations: No    Attends Banker Meetings: Never    Marital Status: Divorced  Catering manager Violence: Not At Risk (03/06/2023)   Humiliation, Afraid, Rape, and Kick questionnaire    Fear of Current or Ex-Partner: No    Emotionally Abused: No    Physically Abused: No    Sexually Abused: No    Family History  Problem Relation Age of Onset   Heart disease Mother    COPD Mother    Arthritis Mother    Cancer Mother        uterine   Lupus Mother    Anxiety disorder  Mother    Depression Mother    Drug abuse Mother    COPD Father    Arthritis Father    Heart disease Father    Heart attack Father    Alcohol abuse Father    Heart disease Brother    Heart attack Brother    Drug abuse Brother  Anxiety disorder Brother    Depression Brother    Heart disease Brother    Cancer Brother        liver cancer age 1 y.o   Diabetes Maternal Grandmother    Diabetes Maternal Grandfather    Diabetes Paternal Grandmother    Diabetes Paternal Grandfather    Breast cancer Neg Hx      Current Outpatient Medications:    acyclovir (ZOVIRAX) 400 MG tablet, Take 1 tablet (400 mg total) by mouth 2 (two) times daily., Disp: 60 tablet, Rfl: 5   amLODipine (NORVASC) 10 MG tablet, Take 1 tablet (10 mg total) by mouth daily., Disp: 90 tablet, Rfl: 3   amoxicillin-clavulanate (AUGMENTIN) 875-125 MG tablet, Take 1 tablet by mouth 2 (two) times daily., Disp: 14 tablet, Rfl: 0   aspirin EC 81 MG tablet, Take 1 tablet (81 mg total) by mouth daily., Disp: 100 tablet, Rfl: 3   butalbital-acetaminophen-caffeine (FIORICET) 50-325-40 MG tablet, TAKE 1 TABLET BY MOUTH EVERY 4 HOURS AS NEEDED FOR HEADACHE, Disp: 14 tablet, Rfl: 3   Cholecalciferol (VITAMIN D3) 50 MCG (2000 UT) CAPS, Take by mouth., Disp: , Rfl:    clonazePAM (KLONOPIN) 1 MG tablet, TAKE 1 TABLET(1 MG) BY MOUTH TWICE DAILY, Disp: 60 tablet, Rfl: 2   cyanocobalamin (VITAMIN B12) 1000 MCG/ML injection, INJECT 1 ML IN THE MUSCLE EVERY 30 DAYS, Disp: 4 mL, Rfl: 12   cyclobenzaprine (FLEXERIL) 10 MG tablet, Take 0.5-1 tablets (5-10 mg total) by mouth 3 (three) times daily as needed for muscle spasms., Disp: 60 tablet, Rfl: 1   dexamethasone (DECADRON) 4 MG tablet, Take 5 tabs (20 mg) weekly the day after daratumumab for 12 weeks. Take with breakfast., Disp: 20 tablet, Rfl: 5   docusate sodium (COLACE) 100 MG capsule, Take 1 capsule (100 mg total) by mouth 2 (two) times daily. (Patient not taking: Reported on 05/26/2023),  Disp: 10 capsule, Rfl: 0   escitalopram (LEXAPRO) 10 MG tablet, Take 1 tablet (10 mg total) by mouth daily., Disp: 30 tablet, Rfl: 2   HYDROmorphone (DILAUDID) 2 MG tablet, Take 2 tablets (4 mg total) by mouth every 6 (six) hours as needed for severe pain (pain score 7-10)., Disp: 112 tablet, Rfl: 0   ipratropium-albuterol (DUONEB) 0.5-2.5 (3) MG/3ML SOLN, Take 3 mLs by nebulization 4 (four) times daily., Disp: 360 mL, Rfl: 0   lenalidomide (REVLIMID) 20 MG capsule, Take 1 capsule (20 mg total) by mouth daily. Take 1 capsule every day for 14 day and then hold for 1 week.  [employer]- [id number] obtained 05/26/2023, Disp: 14 capsule, Rfl: 0   levothyroxine (SYNTHROID) 125 MCG tablet, Take 2 tablets (250 mcg total) by mouth daily before breakfast., Disp: 60 tablet, Rfl: 0   lidocaine-prilocaine (EMLA) cream, Apply 1 Application topically once., Disp: , Rfl:    ondansetron (ZOFRAN) 8 MG tablet, Take 1 tablet (8 mg total) by mouth every 8 (eight) hours as needed for nausea or vomiting., Disp: 30 tablet, Rfl: 1   pantoprazole (PROTONIX) 40 MG tablet, 30 min before lunch or dinner, Disp: 90 tablet, Rfl: 3   polyethylene glycol (MIRALAX / GLYCOLAX) 17 g packet, Take 17 g by mouth 2 (two) times daily. (Patient not taking: Reported on 05/26/2023), Disp: 14 each, Rfl: 0   prochlorperazine (COMPAZINE) 10 MG tablet, Take 1 tablet (10 mg total) by mouth every 6 (six) hours as needed for nausea or vomiting., Disp: 30 tablet, Rfl: 1   senna-docusate (SENOKOT-S) 8.6-50 MG tablet, Take 1 tablet by  mouth 2 (two) times daily., Disp: 30 tablet, Rfl: 0   Spacer/Aero-Holding Chambers (AEROCHAMBER MV) inhaler, Use as instructed, Disp: 1 each, Rfl: 0   SYRINGE-NEEDLE, DISP, 3 ML 25G X 1-1/2" 3 ML MISC, 1 Device by Does not apply route every 30 (thirty) days. With B12 shot, Disp: 12 each, Rfl: 1   thiamine (VITAMIN B-1) 100 MG tablet, Take 1 tablet (100 mg total) by mouth daily., Disp: 30 tablet, Rfl: 0   zolpidem (AMBIEN) 10 MG  tablet, TAKE 1/2 TO 1 TABLET(5 TO 10 MG) BY MOUTH AT BEDTIME AS NEEDED FOR SLEEP, Disp: 30 tablet, Rfl: 2  Physical exam:  Vitals:   06/09/23 0937  BP: (!) 119/93  Pulse: 79  Resp: 18  Temp: (!) 97.4 F (36.3 C)  TempSrc: Tympanic  SpO2: 100%  Weight: 176 lb 1.6 oz (79.9 kg)   Physical Exam Cardiovascular:     Rate and Rhythm: Normal rate and regular rhythm.     Heart sounds: Normal heart sounds.  Pulmonary:     Effort: Pulmonary effort is normal.     Breath sounds: Normal breath sounds.  Skin:    General: Skin is warm and dry.  Neurological:     Mental Status: She is alert and oriented to person, place, and time.         Latest Ref Rng & Units 06/09/2023    9:18 AM  CMP  Glucose 70 - 99 mg/dL 782   BUN 6 - 20 mg/dL 18   Creatinine 9.56 - 1.00 mg/dL 2.13   Sodium 086 - 578 mmol/L 136   Potassium 3.5 - 5.1 mmol/L 3.7   Chloride 98 - 111 mmol/L 103   CO2 22 - 32 mmol/L 23   Calcium 8.9 - 10.3 mg/dL 7.8   Total Protein 6.5 - 8.1 g/dL 6.9   Total Bilirubin <4.6 mg/dL 0.4   Alkaline Phos 38 - 126 U/L 79   AST 15 - 41 U/L 17   ALT 0 - 44 U/L 15       Latest Ref Rng & Units 06/09/2023    9:19 AM  CBC  WBC 4.0 - 10.5 K/uL 4.1   Hemoglobin 12.0 - 15.0 g/dL 96.2   Hematocrit 95.2 - 46.0 % 34.8   Platelets 150 - 400 K/uL 233     Assessment and plan- Patient is a 57 y.o. female with standard risk IgG lambda multiple myeloma R-ISS stage II.  She is here for on treatment assessment prior to cycle 4-day 8 of D RVD chemotherapy  Counts okay to proceed with cycle 4-day 8 of Darzalex and Velcade today.  She will receiveCycle 4-day 15 of Darzalex alone next week and following that she will receive DRVD treatment 2 weeks on and 1 week off for 2 cycles.  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  She will be seen by covering provider in 2 weeks for start of cycle 5-day 1 of treatment and I will see her for cycle 6-day 1.  She  is also on revlimid 20 mg 2 weeks on and 1 week off which she will continue.  Neoplasm related pain: Continue as needed Dilaudid.  She will be getting repeat PET scans at [facility]  Continue low-dose aspirin and acyclovir prophylaxis.  Patient received 1 dose of Xgeva in November 2024 but since then her hypocalcemia has persisted and therefore she is unable to receive Xgeva at this time.  Will consider giving it to her should her calcium levels been more than 8.5   Visit Diagnosis 1. Multiple myeloma not having achieved remission (HCC)   2. High risk medication use   3. Encounter for antineoplastic chemotherapy      Dr. Owens Shark, MD, MPH [facility] [phone] 06/09/2023 11:54 AM

## 2023-06-12 ENCOUNTER — Inpatient Hospital Stay: Payer: Managed Care, Other (non HMO)

## 2023-06-12 ENCOUNTER — Encounter: Payer: Self-pay | Admitting: Oncology

## 2023-06-12 VITALS — BP 166/92 | HR 83 | Temp 98.5°F | Resp 18

## 2023-06-12 DIAGNOSIS — Z5112 Encounter for antineoplastic immunotherapy: Secondary | ICD-10-CM | POA: Diagnosis not present

## 2023-06-12 DIAGNOSIS — C9000 Multiple myeloma not having achieved remission: Secondary | ICD-10-CM

## 2023-06-12 LAB — KAPPA/LAMBDA LIGHT CHAINS
Kappa free light chain: 25.8 mg/L — ABNORMAL HIGH (ref 3.3–19.4)
Kappa, lambda light chain ratio: 1.55 (ref 0.26–1.65)
Lambda free light chains: 16.6 mg/L (ref 5.7–26.3)

## 2023-06-12 MED ORDER — PROCHLORPERAZINE MALEATE 10 MG PO TABS
10.0000 mg | ORAL_TABLET | Freq: Once | ORAL | Status: AC
  Administered 2023-06-12: 10 mg via ORAL
  Filled 2023-06-12: qty 1

## 2023-06-12 MED ORDER — BORTEZOMIB CHEMO SQ INJECTION 3.5 MG (2.5MG/ML)
1.3000 mg/m2 | Freq: Once | INTRAMUSCULAR | Status: AC
  Administered 2023-06-12: 2.25 mg via SUBCUTANEOUS
  Filled 2023-06-12: qty 0.9

## 2023-06-12 NOTE — Patient Instructions (Signed)
 [facility] - A DEPT OF [facility]  Discharge Instructions: Thank you for choosing [facility] to provide your oncology and hematology care.  If you have a lab appointment with the Cancer Center, please go directly to the Cancer Center and check in at the registration area.  Wear comfortable clothing and clothing appropriate for easy access to any Portacath or PICC line.   We strive to give you quality time with your provider. You may need to reschedule your appointment if you arrive late (15 or more minutes).  Arriving late affects you and other patients whose appointments are after yours.  Also, if you miss three or more appointments without notifying the office, you may be dismissed from the clinic at the provider's discretion.      For prescription refill requests, have your pharmacy contact our office and allow 72 hours for refills to be completed.    Today you received the following chemotherapy and/or immunotherapy agents VELCADE       To help prevent nausea and vomiting after your treatment, we encourage you to take your nausea medication as directed.  BELOW ARE SYMPTOMS THAT SHOULD BE REPORTED IMMEDIATELY: *FEVER GREATER THAN 100.4 F (38 C) OR HIGHER *CHILLS OR SWEATING *NAUSEA AND VOMITING THAT IS NOT CONTROLLED WITH YOUR NAUSEA MEDICATION *UNUSUAL SHORTNESS OF BREATH *UNUSUAL BRUISING OR BLEEDING *URINARY PROBLEMS (pain or burning when urinating, or frequent urination) *BOWEL PROBLEMS (unusual diarrhea, constipation, pain near the anus) TENDERNESS IN MOUTH AND THROAT WITH OR WITHOUT PRESENCE OF ULCERS (sore throat, sores in mouth, or a toothache) UNUSUAL RASH, SWELLING OR PAIN  UNUSUAL VAGINAL DISCHARGE OR ITCHING   Items with * indicate a potential emergency and should be followed up as soon as possible or go to the Emergency Department if any problems should occur.  Please show the CHEMOTHERAPY ALERT CARD or IMMUNOTHERAPY  ALERT CARD at check-in to the Emergency Department and triage nurse.  Should you have questions after your visit or need to cancel or reschedule your appointment, please contact [facility] - A DEPT OF [facility]  [phone] and follow the prompts.  Office hours are 8:00 a.m. to 4:30 p.m. Monday - Friday. Please note that voicemails left after 4:00 p.m. may not be returned until the following business day.  We are closed weekends and major holidays. You have access to a nurse at all times for urgent questions. Please call the main number to the clinic [phone] and follow the prompts.  For any non-urgent questions, you may also contact your provider using MyChart. We now offer e-Visits for anyone 81 and older to request care online for non-urgent symptoms. For details visit [email or web address].   Also download the MyChart app! Go to the app store, search "MyChart", open the app, select [place], and log in with your MyChart username and password.   Bortezomib Injection What is this medication? BORTEZOMIB (bor TEZ oh mib) treats lymphoma. It may also be used to treat multiple myeloma, a type of bone marrow cancer. It works by blocking a protein that causes cancer cells to grow and multiply. This helps to slow or stop the spread of cancer cells. This medicine may be used for other purposes; ask your health care provider or pharmacist if you have questions. COMMON BRAND NAME(S): Velcade What should I tell my care team before I take this medication? They need to know if you have any  of these conditions: Dehydration Diabetes Heart disease Liver disease Tingling of the fingers or toes or other nerve disorder An unusual or allergic reaction to bortezomib, other medications, foods, dyes, or preservatives If you or your partner are pregnant or trying to get pregnant Breastfeeding How should I use this medication? This medication is injected into a vein  or under the skin. It is given by your care team in a hospital or clinic setting. Talk to your care team about the use of this medication in children. Special care may be needed. Overdosage: If you think you have taken too much of this medicine contact a poison control center or emergency room at once. NOTE: This medicine is only for you. Do not share this medicine with others. What if I miss a dose? Keep appointments for follow-up doses. It is important not to miss your dose. Call your care team if you are unable to keep an appointment. What may interact with this medication? Ketoconazole Rifampin This list may not describe all possible interactions. Give your health care provider a list of all the medicines, herbs, non-prescription drugs, or dietary supplements you use. Also tell them if you smoke, drink alcohol, or use illegal drugs. Some items may interact with your medicine. What should I watch for while using this medication? Your condition will be monitored carefully while you are receiving this medication. You may need blood work while taking this medication. This medication may affect your coordination, reaction time, or judgment. Do not drive or operate machinery until you know how this medication affects you. Sit up or stand slowly to reduce the risk of dizzy or fainting spells. Drinking alcohol with this medication can increase the risk of these side effects. This medication may increase your risk of getting an infection. Call your care team for advice if you get a fever, chills, sore throat, or other symptoms of a cold or flu. Do not treat yourself. Try to avoid being around people who are sick. Check with your care team if you have severe diarrhea, nausea, and vomiting, or if you sweat a lot. The loss of too much body fluid may make it dangerous for you to take this medication. Talk to your care team if you may be pregnant. Serious birth defects can occur if you take this medication  during pregnancy and for 7 months after the last dose. You will need a negative pregnancy test before starting this medication. Contraception is recommended while taking this medication and for 7 months after the last dose. Your care team can help you find the option that works for you. If your partner can get pregnant, use a condom during sex while taking this medication and for 4 months after the last dose. Do not breastfeed while taking this medication and for 2 months after the last dose. This medication may cause infertility. Talk to your care team if you are concerned about your fertility. What side effects may I notice from receiving this medication? Side effects that you should report to your care team as soon as possible: Allergic reactions--skin rash, itching, hives, swelling of the face, lips, tongue, or throat Bleeding--bloody or black, tar-like stools, vomiting blood or brown material that looks like coffee grounds, red or dark brown urine, small red or purple spots on skin, unusual bruising or bleeding Bleeding in the brain--severe headache, stiff neck, confusion, dizziness, change in vision, numbness or weakness of the face, arm, or leg, trouble speaking, trouble walking, vomiting Bowel blockage--stomach cramping, unable  to have a bowel movement or pass gas, loss of appetite, vomiting Heart failure--shortness of breath, swelling of the ankles, feet, or hands, sudden weight gain, unusual weakness or fatigue Infection--fever, chills, cough, sore throat, wounds that don't heal, pain or trouble when passing urine, general feeling of discomfort or being unwell Liver injury--right upper belly pain, loss of appetite, nausea, light-colored stool, dark yellow or brown urine, yellowing skin or eyes, unusual weakness or fatigue Low blood pressure--dizziness, feeling faint or lightheaded, blurry vision Lung injury--shortness of breath or trouble breathing, cough, spitting up blood, chest pain,  fever Pain, tingling, or numbness in the hands or feet Severe or prolonged diarrhea Stomach pain, bloody diarrhea, pale skin, unusual weakness or fatigue, decrease in the amount of urine, which may be signs of hemolytic uremic syndrome Sudden and severe headache, confusion, change in vision, seizures, which may be signs of posterior reversible encephalopathy syndrome (PRES) TTP--purple spots on the skin or inside the mouth, pale skin, yellowing skin or eyes, unusual weakness or fatigue, fever, fast or irregular heartbeat, confusion, change in vision, trouble speaking, trouble walking Tumor lysis syndrome (TLS)--nausea, vomiting, diarrhea, decrease in the amount of urine, dark urine, unusual weakness or fatigue, confusion, muscle pain or cramps, fast or irregular heartbeat, joint pain Side effects that usually do not require medical attention (report to your care team if they continue or are bothersome): Constipation Diarrhea Fatigue Loss of appetite Nausea This list may not describe all possible side effects. Call your doctor for medical advice about side effects. You may report side effects to FDA at 1-800-FDA-1088. Where should I keep my medication? This medication is given in a hospital or clinic. It will not be stored at home. NOTE: This sheet is a summary. It may not cover all possible information. If you have questions about this medicine, talk to your doctor, pharmacist, or health care provider.  2024 Elsevier/Gold Standard (2021-11-06 00:00:00)

## 2023-06-13 ENCOUNTER — Other Ambulatory Visit: Payer: Self-pay | Admitting: Oncology

## 2023-06-13 MED ORDER — HYDROMORPHONE HCL 2 MG PO TABS: 4.0000 mg | ORAL_TABLET | Freq: Four times a day (QID) | ORAL | 0 refills | Status: DC | PRN

## 2023-06-16 ENCOUNTER — Inpatient Hospital Stay: Payer: Managed Care, Other (non HMO)

## 2023-06-16 ENCOUNTER — Other Ambulatory Visit: Payer: Self-pay

## 2023-06-16 DIAGNOSIS — Z5111 Encounter for antineoplastic chemotherapy: Secondary | ICD-10-CM

## 2023-06-16 LAB — COLOGUARD

## 2023-06-17 ENCOUNTER — Other Ambulatory Visit: Payer: Self-pay

## 2023-06-17 ENCOUNTER — Inpatient Hospital Stay: Payer: Managed Care, Other (non HMO)

## 2023-06-17 VITALS — BP 165/73 | HR 63 | Temp 98.9°F | Resp 16

## 2023-06-17 DIAGNOSIS — Z5112 Encounter for antineoplastic immunotherapy: Secondary | ICD-10-CM | POA: Diagnosis not present

## 2023-06-17 DIAGNOSIS — C9000 Multiple myeloma not having achieved remission: Secondary | ICD-10-CM

## 2023-06-17 LAB — CBC WITH DIFFERENTIAL (CANCER CENTER ONLY)
Abs Immature Granulocytes: 0.01 10*3/uL (ref 0.00–0.07)
Basophils Absolute: 0.1 10*3/uL (ref 0.0–0.1)
Basophils Relative: 1 %
Eosinophils Absolute: 0.4 10*3/uL (ref 0.0–0.5)
Eosinophils Relative: 7 %
HCT: 32.7 % — ABNORMAL LOW (ref 36.0–46.0)
Hemoglobin: 10.8 g/dL — ABNORMAL LOW (ref 12.0–15.0)
Immature Granulocytes: 0 %
Lymphocytes Relative: 22 %
Lymphs Abs: 1.1 10*3/uL (ref 0.7–4.0)
MCH: 28.6 pg (ref 26.0–34.0)
MCHC: 33 g/dL (ref 30.0–36.0)
MCV: 86.5 fL (ref 80.0–100.0)
Monocytes Absolute: 0.5 10*3/uL (ref 0.1–1.0)
Monocytes Relative: 10 %
Neutro Abs: 3.1 10*3/uL (ref 1.7–7.7)
Neutrophils Relative %: 60 %
Platelet Count: 157 10*3/uL (ref 150–400)
RBC: 3.78 MIL/uL — ABNORMAL LOW (ref 3.87–5.11)
RDW: 14.3 % (ref 11.5–15.5)
WBC Count: 5.2 10*3/uL (ref 4.0–10.5)
nRBC: 0 % (ref 0.0–0.2)

## 2023-06-17 LAB — CMP (CANCER CENTER ONLY)
ALT: 15 U/L (ref 0–44)
AST: 14 U/L — ABNORMAL LOW (ref 15–41)
Albumin: 3.3 g/dL — ABNORMAL LOW (ref 3.5–5.0)
Alkaline Phosphatase: 66 U/L (ref 38–126)
Anion gap: 9 (ref 5–15)
BUN: 13 mg/dL (ref 6–20)
CO2: 21 mmol/L — ABNORMAL LOW (ref 22–32)
Calcium: 6.4 mg/dL — CL (ref 8.9–10.3)
Chloride: 106 mmol/L (ref 98–111)
Creatinine: 1.03 mg/dL — ABNORMAL HIGH (ref 0.44–1.00)
GFR, Estimated: >60 mL/min (ref >60)
Glucose, Bld: 109 mg/dL — ABNORMAL HIGH (ref 70–99)
Potassium: 3.7 mmol/L (ref 3.5–5.1)
Sodium: 136 mmol/L (ref 135–145)
Total Bilirubin: 0.4 mg/dL (ref 0.0–1.2)
Total Protein: 6.3 g/dL — ABNORMAL LOW (ref 6.5–8.1)

## 2023-06-17 MED ORDER — DIPHENHYDRAMINE HCL 25 MG PO CAPS
50.0000 mg | ORAL_CAPSULE | Freq: Once | ORAL | Status: AC
Start: 2023-06-17 — End: 2023-06-17
  Administered 2023-06-17: 50 mg via ORAL
  Filled 2023-06-17: qty 2

## 2023-06-17 MED ORDER — ACETAMINOPHEN 325 MG PO TABS
650.0000 mg | ORAL_TABLET | Freq: Once | ORAL | Status: AC
Start: 2023-06-17 — End: 2023-06-17
  Administered 2023-06-17: 650 mg via ORAL
  Filled 2023-06-17: qty 2

## 2023-06-17 MED ORDER — DEXAMETHASONE 4 MG PO TABS
20.0000 mg | ORAL_TABLET | Freq: Once | ORAL | Status: AC
Start: 2023-06-17 — End: 2023-06-17
  Administered 2023-06-17: 20 mg via ORAL
  Filled 2023-06-17: qty 5

## 2023-06-17 MED ORDER — SODIUM CHLORIDE 0.9 % IV SOLN
2.0000 g | Freq: Once | INTRAVENOUS | Status: AC
  Administered 2023-06-17: 2 g via INTRAVENOUS
  Filled 2023-06-17: qty 20

## 2023-06-17 MED ORDER — DARATUMUMAB-HYALURONIDASE-FIHJ 1800-30000 MG-UT/15ML ~~LOC~~ SOLN
1800.0000 mg | Freq: Once | SUBCUTANEOUS | Status: AC
  Administered 2023-06-17: 1800 mg via SUBCUTANEOUS
  Filled 2023-06-17: qty 15

## 2023-06-17 MED ORDER — SODIUM CHLORIDE 0.9 % IV SOLN
INTRAVENOUS | Status: DC
  Filled 2023-06-17: qty 250

## 2023-06-17 NOTE — Patient Instructions (Addendum)
 [facility] - A DEPT OF [facility]  Discharge Instructions: Thank you for choosing [facility] to provide your oncology and hematology care.  If you have a lab appointment with the Cancer Center, please go directly to the Cancer Center and check in at the registration area.  Wear comfortable clothing and clothing appropriate for easy access to any Portacath or PICC line.   We strive to give you quality time with your provider. You may need to reschedule your appointment if you arrive late (15 or more minutes).  Arriving late affects you and other patients whose appointments are after yours.  Also, if you miss three or more appointments without notifying the office, you may be dismissed from the clinic at the provider's discretion.      For prescription refill requests, have your pharmacy contact our office and allow 72 hours for refills to be completed.    Today you received the following chemotherapy and/or immunotherapy agents- daratumumab       To help prevent nausea and vomiting after your treatment, we encourage you to take your nausea medication as directed.  BELOW ARE SYMPTOMS THAT SHOULD BE REPORTED IMMEDIATELY: *FEVER GREATER THAN 100.4 F (38 C) OR HIGHER *CHILLS OR SWEATING *NAUSEA AND VOMITING THAT IS NOT CONTROLLED WITH YOUR NAUSEA MEDICATION *UNUSUAL SHORTNESS OF BREATH *UNUSUAL BRUISING OR BLEEDING *URINARY PROBLEMS (pain or burning when urinating, or frequent urination) *BOWEL PROBLEMS (unusual diarrhea, constipation, pain near the anus) TENDERNESS IN MOUTH AND THROAT WITH OR WITHOUT PRESENCE OF ULCERS (sore throat, sores in mouth, or a toothache) UNUSUAL RASH, SWELLING OR PAIN  UNUSUAL VAGINAL DISCHARGE OR ITCHING   Items with * indicate a potential emergency and should be followed up as soon as possible or go to the Emergency Department if any problems should occur.  Please show the CHEMOTHERAPY ALERT CARD or IMMUNOTHERAPY  ALERT CARD at check-in to the Emergency Department and triage nurse.  Should you have questions after your visit or need to cancel or reschedule your appointment, please contact [facility] - A DEPT OF [facility]  [phone] and follow the prompts.  Office hours are 8:00 a.m. to 4:30 p.m. Monday - Friday. Please note that voicemails left after 4:00 p.m. may not be returned until the following business day.  We are closed weekends and major holidays. You have access to a nurse at all times for urgent questions. Please call the main number to the clinic [phone] and follow the prompts.  For any non-urgent questions, you may also contact your provider using MyChart. We now offer e-Visits for anyone 76 and older to request care online for non-urgent symptoms. For details visit [email or web address].   Also download the MyChart app! Go to the app store, search MyChart, open the app, select , and log in with your MyChart username and password.   Calcium  Gluconate Injection What is this medication? CALCIUM  GLUCONATE (KAL see um GLOO koe nate) increases calcium  levels in your body. Calcium  is a mineral that plays an important role in building strong bones and maintaining heart health. This medicine may be used for other purposes; ask your health care provider or pharmacist if you have questions. What should I tell my care team before I take this medication? They need to know if you have any of these conditions: High levels of calcium  in the blood History of irregular heartbeat History of kidney stones Kidney disease  Parathyroid disease An unusual or allergic reaction to calcium , other medications, foods, dyes, or preservatives Pregnant or trying to get pregnant Breast-feeding How should I use this medication? This medication is injected into a vein. It is given by your care team in a hospital or clinic setting. Talk to your care team about the  use of this medication in children. While it may be given to children for selected conditions, precautions do apply. Overdosage: If you think you have taken too much of this medicine contact a poison control center or emergency room at once. NOTE: This medicine is only for you. Do not share this medicine with others. What if I miss a dose? This does not apply. What may interact with this medication? Ceftriaxone  Certain diuretics Digoxin Other calcium  products This list may not describe all possible interactions. Give your health care provider a list of all the medicines, herbs, non-prescription drugs, or dietary supplements you use. Also tell them if you smoke, drink alcohol, or use illegal drugs. Some items may interact with your medicine. What should I watch for while using this medication? Your condition will be monitored carefully while you are receiving this medication. You may need blood work while you are taking this medication. What side effects may I notice from receiving this medication? Side effects that you should report to your care team as soon as possible: Allergic reactions--skin rash, itching, hives, swelling of the face, lips, tongue, or throat Fast or irregular heartbeat High calcium  level--increased thirst or amount of urine, nausea, vomiting, confusion, unusual weakness or fatigue, bone pain Low blood pressure--dizziness, feeling faint or lightheaded, blurry vision Pain, redness, or irritation at injection site Side effects that usually do not require medical attention (report to your care team if they continue or are bothersome): Change in taste Flushing, mostly over the face, neck, and chest, during injection This list may not describe all possible side effects. Call your doctor for medical advice about side effects. You may report side effects to FDA at 1-800-FDA-1088. Where should I keep my medication? This medication is given in a hospital or clinic. It will not be  stored at home. NOTE: This sheet is a summary. It may not cover all possible information. If you have questions about this medicine, talk to your doctor, pharmacist, or health care provider.  2024 Elsevier/Gold Standard (2021-12-24 00:00:00)

## 2023-06-19 ENCOUNTER — Inpatient Hospital Stay: Payer: Managed Care, Other (non HMO) | Attending: Oncology

## 2023-06-19 ENCOUNTER — Inpatient Hospital Stay: Payer: Managed Care, Other (non HMO)

## 2023-06-19 ENCOUNTER — Encounter: Payer: Self-pay | Admitting: Oncology

## 2023-06-19 DIAGNOSIS — R0602 Shortness of breath: Secondary | ICD-10-CM

## 2023-06-19 DIAGNOSIS — Z5112 Encounter for antineoplastic immunotherapy: Secondary | ICD-10-CM | POA: Diagnosis present

## 2023-06-19 DIAGNOSIS — Z7982 Long term (current) use of aspirin: Secondary | ICD-10-CM | POA: Diagnosis not present

## 2023-06-19 DIAGNOSIS — R5383 Other fatigue: Secondary | ICD-10-CM | POA: Diagnosis not present

## 2023-06-19 DIAGNOSIS — Z8719 Personal history of other diseases of the digestive system: Secondary | ICD-10-CM | POA: Insufficient documentation

## 2023-06-19 DIAGNOSIS — Z9884 Bariatric surgery status: Secondary | ICD-10-CM | POA: Insufficient documentation

## 2023-06-19 DIAGNOSIS — Z79891 Long term (current) use of opiate analgesic: Secondary | ICD-10-CM | POA: Diagnosis not present

## 2023-06-19 DIAGNOSIS — C9000 Multiple myeloma not having achieved remission: Secondary | ICD-10-CM

## 2023-06-19 DIAGNOSIS — Z923 Personal history of irradiation: Secondary | ICD-10-CM | POA: Diagnosis not present

## 2023-06-19 DIAGNOSIS — Z800 Family history of malignant neoplasm of digestive organs: Secondary | ICD-10-CM | POA: Insufficient documentation

## 2023-06-19 DIAGNOSIS — D509 Iron deficiency anemia, unspecified: Secondary | ICD-10-CM

## 2023-06-19 DIAGNOSIS — Z87891 Personal history of nicotine dependence: Secondary | ICD-10-CM | POA: Insufficient documentation

## 2023-06-19 DIAGNOSIS — Z7984 Long term (current) use of oral hypoglycemic drugs: Secondary | ICD-10-CM | POA: Insufficient documentation

## 2023-06-19 DIAGNOSIS — G893 Neoplasm related pain (acute) (chronic): Secondary | ICD-10-CM | POA: Insufficient documentation

## 2023-06-19 DIAGNOSIS — R7989 Other specified abnormal findings of blood chemistry: Secondary | ICD-10-CM

## 2023-06-19 DIAGNOSIS — Z5111 Encounter for antineoplastic chemotherapy: Secondary | ICD-10-CM

## 2023-06-19 DIAGNOSIS — Z79899 Other long term (current) drug therapy: Secondary | ICD-10-CM | POA: Insufficient documentation

## 2023-06-19 LAB — BASIC METABOLIC PANEL
Anion gap: 8 (ref 5–15)
BUN: 20 mg/dL (ref 6–20)
CO2: 22 mmol/L (ref 22–32)
Calcium: 7.1 mg/dL — ABNORMAL LOW (ref 8.9–10.3)
Chloride: 106 mmol/L (ref 98–111)
Creatinine, Ser: 1.05 mg/dL — ABNORMAL HIGH (ref 0.44–1.00)
GFR, Estimated: >60 mL/min (ref >60)
Glucose, Bld: 92 mg/dL (ref 70–99)
Potassium: 3.8 mmol/L (ref 3.5–5.1)
Sodium: 136 mmol/L (ref 135–145)

## 2023-06-19 LAB — CBC WITH DIFFERENTIAL (CANCER CENTER ONLY)
Abs Immature Granulocytes: 0.02 10*3/uL (ref 0.00–0.07)
Basophils Absolute: 0.1 10*3/uL (ref 0.0–0.1)
Basophils Relative: 1 %
Eosinophils Absolute: 0.5 10*3/uL (ref 0.0–0.5)
Eosinophils Relative: 10 %
HCT: 32.1 % — ABNORMAL LOW (ref 36.0–46.0)
Hemoglobin: 10.7 g/dL — ABNORMAL LOW (ref 12.0–15.0)
Immature Granulocytes: 0 %
Lymphocytes Relative: 23 %
Lymphs Abs: 1.1 10*3/uL (ref 0.7–4.0)
MCH: 28.5 pg (ref 26.0–34.0)
MCHC: 33.3 g/dL (ref 30.0–36.0)
MCV: 85.6 fL (ref 80.0–100.0)
Monocytes Absolute: 0.7 10*3/uL (ref 0.1–1.0)
Monocytes Relative: 14 %
Neutro Abs: 2.5 10*3/uL (ref 1.7–7.7)
Neutrophils Relative %: 52 %
Platelet Count: 177 10*3/uL (ref 150–400)
RBC: 3.75 MIL/uL — ABNORMAL LOW (ref 3.87–5.11)
RDW: 14.3 % (ref 11.5–15.5)
WBC Count: 4.9 10*3/uL (ref 4.0–10.5)
nRBC: 0 % (ref 0.0–0.2)

## 2023-06-19 LAB — KAPPA/LAMBDA LIGHT CHAINS
Kappa free light chain: 24.3 mg/L — ABNORMAL HIGH (ref 3.3–19.4)
Kappa, lambda light chain ratio: 1.46 (ref 0.26–1.65)
Lambda free light chains: 16.6 mg/L (ref 5.7–26.3)

## 2023-06-19 LAB — VITAMIN B12: Vitamin B-12: 1122 pg/mL — ABNORMAL HIGH (ref 180–914)

## 2023-06-19 MED ORDER — SODIUM CHLORIDE 0.9 % IV SOLN
2.0000 g | Freq: Once | INTRAVENOUS | Status: AC
Start: 2023-06-19 — End: 2023-06-19
  Administered 2023-06-19: 2 g via INTRAVENOUS
  Filled 2023-06-19: qty 20

## 2023-06-19 MED ORDER — SODIUM CHLORIDE 0.9% FLUSH
10.0000 mL | Freq: Once | INTRAVENOUS | Status: AC
  Administered 2023-06-19: 10 mL via INTRAVENOUS
  Filled 2023-06-19: qty 10

## 2023-06-19 MED ORDER — SODIUM CHLORIDE 0.9 % IV SOLN
INTRAVENOUS | Status: DC
Start: 2023-06-19 — End: 2023-06-19
  Filled 2023-06-19: qty 250

## 2023-06-19 NOTE — Patient Instructions (Signed)
 Please continue to take calcium 1200 mg twice daily and vit d 1000 units twice daily.

## 2023-06-19 NOTE — Progress Notes (Signed)
 Calcium  level 7.1. per v/o Josh pt needs 2G of calcium  gluconate. Pt needs f/u next week for repeat metb. I reviewed chart. Pt has an apt next Monday for repeat labs and tx.  I confirmed with patient that she is taking the calcium  1200 mg twice daily and is taking vit d 1000 units twice daily.

## 2023-06-20 ENCOUNTER — Other Ambulatory Visit: Payer: Self-pay | Admitting: *Deleted

## 2023-06-20 DIAGNOSIS — C9000 Multiple myeloma not having achieved remission: Secondary | ICD-10-CM

## 2023-06-23 ENCOUNTER — Inpatient Hospital Stay (HOSPITAL_BASED_OUTPATIENT_CLINIC_OR_DEPARTMENT_OTHER): Payer: Managed Care, Other (non HMO) | Admitting: Nurse Practitioner

## 2023-06-23 ENCOUNTER — Encounter: Payer: Self-pay | Admitting: Oncology

## 2023-06-23 ENCOUNTER — Encounter: Payer: Self-pay | Admitting: Nurse Practitioner

## 2023-06-23 ENCOUNTER — Inpatient Hospital Stay: Payer: Managed Care, Other (non HMO)

## 2023-06-23 ENCOUNTER — Other Ambulatory Visit: Payer: Self-pay | Admitting: *Deleted

## 2023-06-23 VITALS — BP 158/98 | HR 78 | Temp 97.6°F | Resp 20 | Wt 173.3 lb

## 2023-06-23 DIAGNOSIS — Z5111 Encounter for antineoplastic chemotherapy: Secondary | ICD-10-CM

## 2023-06-23 DIAGNOSIS — C9000 Multiple myeloma not having achieved remission: Secondary | ICD-10-CM

## 2023-06-23 DIAGNOSIS — D509 Iron deficiency anemia, unspecified: Secondary | ICD-10-CM

## 2023-06-23 DIAGNOSIS — Z5112 Encounter for antineoplastic immunotherapy: Secondary | ICD-10-CM | POA: Diagnosis not present

## 2023-06-23 LAB — CBC WITH DIFFERENTIAL (CANCER CENTER ONLY)
Abs Immature Granulocytes: 0.01 10*3/uL (ref 0.00–0.07)
Basophils Absolute: 0.1 10*3/uL (ref 0.0–0.1)
Basophils Relative: 2 %
Eosinophils Absolute: 1.1 10*3/uL — ABNORMAL HIGH (ref 0.0–0.5)
Eosinophils Relative: 16 %
HCT: 33.6 % — ABNORMAL LOW (ref 36.0–46.0)
Hemoglobin: 11.1 g/dL — ABNORMAL LOW (ref 12.0–15.0)
Immature Granulocytes: 0 %
Lymphocytes Relative: 22 %
Lymphs Abs: 1.4 10*3/uL (ref 0.7–4.0)
MCH: 27.9 pg (ref 26.0–34.0)
MCHC: 33 g/dL (ref 30.0–36.0)
MCV: 84.4 fL (ref 80.0–100.0)
Monocytes Absolute: 1 10*3/uL (ref 0.1–1.0)
Monocytes Relative: 15 %
Neutro Abs: 3 10*3/uL (ref 1.7–7.7)
Neutrophils Relative %: 45 %
Platelet Count: 283 10*3/uL (ref 150–400)
RBC: 3.98 MIL/uL (ref 3.87–5.11)
RDW: 13.8 % (ref 11.5–15.5)
WBC Count: 6.5 10*3/uL (ref 4.0–10.5)
nRBC: 0 % (ref 0.0–0.2)

## 2023-06-23 LAB — MULTIPLE MYELOMA PANEL, SERUM
Albumin SerPl Elph-Mcnc: 3.5 g/dL (ref 2.9–4.4)
Albumin/Glob SerPl: 1.3 (ref 0.7–1.7)
Alpha 1: 0.3 g/dL (ref 0.0–0.4)
Alpha2 Glob SerPl Elph-Mcnc: 0.8 g/dL (ref 0.4–1.0)
B-Globulin SerPl Elph-Mcnc: 1.2 g/dL (ref 0.7–1.3)
Gamma Glob SerPl Elph-Mcnc: 0.5 g/dL (ref 0.4–1.8)
Globulin, Total: 2.7 g/dL (ref 2.2–3.9)
IgA: 29 mg/dL — ABNORMAL LOW (ref 87–352)
IgG (Immunoglobin G), Serum: 896 mg/dL (ref 586–1602)
IgM (Immunoglobulin M), Srm: 37 mg/dL (ref 26–217)
M Protein SerPl Elph-Mcnc: 0.6 g/dL — ABNORMAL HIGH
Total Protein ELP: 6.2 g/dL (ref 6.0–8.5)

## 2023-06-23 LAB — CMP (CANCER CENTER ONLY)
ALT: 14 U/L (ref 0–44)
AST: 20 U/L (ref 15–41)
Albumin: 3.4 g/dL — ABNORMAL LOW (ref 3.5–5.0)
Alkaline Phosphatase: 71 U/L (ref 38–126)
Anion gap: 10 (ref 5–15)
BUN: 11 mg/dL (ref 6–20)
CO2: 20 mmol/L — ABNORMAL LOW (ref 22–32)
Calcium: 6.6 mg/dL — ABNORMAL LOW (ref 8.9–10.3)
Chloride: 104 mmol/L (ref 98–111)
Creatinine: 1.19 mg/dL — ABNORMAL HIGH (ref 0.44–1.00)
GFR, Estimated: 53 mL/min — ABNORMAL LOW (ref >60)
Glucose, Bld: 129 mg/dL — ABNORMAL HIGH (ref 70–99)
Potassium: 3.6 mmol/L (ref 3.5–5.1)
Sodium: 134 mmol/L — ABNORMAL LOW (ref 135–145)
Total Bilirubin: 0.4 mg/dL (ref 0.0–1.2)
Total Protein: 6.3 g/dL — ABNORMAL LOW (ref 6.5–8.1)

## 2023-06-23 MED ORDER — ACETAMINOPHEN 325 MG PO TABS
650.0000 mg | ORAL_TABLET | Freq: Once | ORAL | Status: AC
  Administered 2023-06-23: 650 mg via ORAL
  Filled 2023-06-23: qty 2

## 2023-06-23 MED ORDER — SODIUM CHLORIDE 0.9 % IV SOLN
INTRAVENOUS | Status: DC
Start: 2023-06-23 — End: 2023-06-23
  Filled 2023-06-23: qty 250

## 2023-06-23 MED ORDER — DEXAMETHASONE 4 MG PO TABS
20.0000 mg | ORAL_TABLET | Freq: Once | ORAL | Status: AC
  Administered 2023-06-23: 20 mg via ORAL
  Filled 2023-06-23: qty 5

## 2023-06-23 MED ORDER — DARATUMUMAB-HYALURONIDASE-FIHJ 1800-30000 MG-UT/15ML ~~LOC~~ SOLN
1800.0000 mg | Freq: Once | SUBCUTANEOUS | Status: AC
  Administered 2023-06-23: 1800 mg via SUBCUTANEOUS
  Filled 2023-06-23: qty 15

## 2023-06-23 MED ORDER — DIPHENHYDRAMINE HCL 25 MG PO CAPS
50.0000 mg | ORAL_CAPSULE | Freq: Once | ORAL | Status: AC
  Administered 2023-06-23: 50 mg via ORAL
  Filled 2023-06-23: qty 2

## 2023-06-23 MED ORDER — LENALIDOMIDE 20 MG PO CAPS: 20.0000 mg | ORAL_CAPSULE | Freq: Every day | ORAL | 0 refills | Status: DC

## 2023-06-23 MED ORDER — SODIUM CHLORIDE 0.9 % IV SOLN
2.0000 g | Freq: Once | INTRAVENOUS | Status: AC
Start: 2023-06-23 — End: 2023-06-23
  Administered 2023-06-23: 2 g via INTRAVENOUS
  Filled 2023-06-23: qty 20

## 2023-06-23 MED ORDER — BORTEZOMIB CHEMO SQ INJECTION 3.5 MG (2.5MG/ML)
1.3000 mg/m2 | Freq: Once | INTRAMUSCULAR | Status: AC
Start: 2023-06-23 — End: 2023-06-23
  Administered 2023-06-23: 2.25 mg via SUBCUTANEOUS
  Filled 2023-06-23: qty 0.9

## 2023-06-23 NOTE — Progress Notes (Signed)
 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 She was last seen by me in April 2024 and at that time she had complained of mild left chest wall pain radiating to her back.  She underwent cardiac workup and I had done a bone survey at that time which did not show any evidence of bone disease.  Her smoldering multiple myeloma at that time was stable.  Subsequently she has had chest x-ray in May 2024 as well as a rib x-ray in July 2024.  None of these showed any abnormalities.  Her pain has gradually worsened in the last 2 months but especially so in the last 1 week.  She presented to the ER with symptoms of pleuritic chest wall pain and shortness of breath and underwent a CT angio chest which showed a soft tissue mass with bone destruction involving the left seventh rib as well as T6 and T7 vertebral bodies and central canal.  Possibility of cord compression.  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   Results of myeloma work-up from 06/20/2020 showed an  elevated IgG of 3668.  M protein was elevated at 3.1 and IFE showed IgG monoclonal lambda protein.  Beta-2  microglobulin and LDH were normal.  Serum free light chain ratio was elevated at 25 with a free light chain lambda of 303.  Bone marrow biopsy showed increased plasma cells 22% by manual count and 20% by IHC staining for CD138.  Cytogenetics for myeloma showed gain of 1 q.   MRI cervical and lumbar thoracic spine did not show any evidence of lytic lesions.  Bone survey was negative for bone lesions as well.  MRI pelvis with and without contrast showed diffuse patchy heterogeneous marrow concerning for myeloma    Patient had a repeat bone marrow biopsy inAugust 2024 which showed 20% overall plasma cells and hypercellular marrow consistent with plasma cell myeloma.  Cytogenetics normal and FISH studies did not show any abnormalities.  Patient had stabilization procedure/spinal fusion T4-T9 with T6-T7 laminotomies.  Spinal mass biopsy was consistent with plasma cell neoplasm. PET CT scan showed solitary hypermetabolic lytic lesion in the L4 vertebral body consistent with multiple myeloma.  Residual soft tissue density at the left paraspinal resection site T6-T7 with no significant metabolic activity.   Plan was to do outpatient daratumumab  Revlimid  Velcade  dexamethasone  regimen.  However patient was found to be in acute kidney failure with a creatinine of 5 in August 2024 and was sent to the ER.  She received cycle 1 of CyBorD in the hospital with improvement in her creatinine with stabilized between 1.5-1.7.  Treatment has been complicated by repeated hospitalizations for acute hypoxic respiratory failure and left pleural effusion which has been treated with antibiotics and thoracentesis.    Subsequently patient's clinical condition improved and she started D VRD regimen as per [facility] protocol on 03/31/2023    Interval history- Patient returns to clinic for follow up an dconsideration of treatment. Her  pain is better controlled. Cough and congestion have resolved. She reports compliance with calcium  tablets and vitamin d .   ECOG PS- 1 Pain scale- 0   Review of systems- Review of Systems  Constitutional:  Negative for chills, fever, malaise/fatigue and weight loss.  HENT:  Negative for congestion, ear discharge and nosebleeds.   Eyes:  Negative for blurred vision.  Respiratory:  Negative for cough, hemoptysis, sputum production, shortness of breath and wheezing.   Cardiovascular:  Negative for chest pain, palpitations, orthopnea and claudication.  Gastrointestinal:  Negative for abdominal pain, blood in stool, constipation, diarrhea, heartburn, melena, nausea and vomiting.  Genitourinary:  Negative for dysuria, flank pain, frequency, hematuria and urgency.  Musculoskeletal:  Negative for back pain, joint pain and myalgias.  Skin:  Negative for rash.  Neurological:  Negative for dizziness, tingling, focal weakness, seizures, weakness and headaches.  Endo/Heme/Allergies:  Does not bruise/bleed easily.  Psychiatric/Behavioral:  Negative for depression and suicidal ideas. The patient does not have insomnia.      Allergies  Allergen Reactions   Hctz [Hydrochlorothiazide ]     Hyponatremia     Past Medical History:  Diagnosis Date   Anxiety    Arthritis    Asthma    Barrett's esophagus 2015   COVID-19    03/10/20   Depression    GERD (gastroesophageal reflux disease)    Hypertension    Hypothyroidism    Pneumonia    Smoldering myeloma    Thyroid  disease     Past Surgical History:  Procedure Laterality Date   ABLATION  2006   APPLICATION OF INTRAOPERATIVE CT SCAN N/A 01/28/2023   Procedure: APPLICATION OF INTRAOPERATIVE CT SCAN;  Surgeon: Claudene Penne Lin, MD;  Location: [facility] ORS;  Service: Neurosurgery;  Laterality: N/A;   BREAST CYST ASPIRATION Left 1998   BREAST SURGERY     cyst aspiration   BREAST SURGERY  1998   cyst aspiration   COLONOSCOPY  03/2014    COLONOSCOPY WITH PROPOFOL  N/A 01/06/2020   Procedure: COLONOSCOPY WITH PROPOFOL ;  Surgeon: Toledo, Ladell POUR, MD;  Location: [facility] ENDOSCOPY;  Service: Gastroenterology;  Laterality: N/A;   ENDOMETRIAL ABLATION     ESOPHAGOGASTRODUODENOSCOPY (EGD) WITH PROPOFOL  N/A 08/15/2015   Procedure: ESOPHAGOGASTRODUODENOSCOPY (EGD) WITH PROPOFOL ;  Surgeon: Gladis RAYMOND Mariner, MD;  Location: [facility] ENDOSCOPY;  Service: Endoscopy;  Laterality: N/A;   ESOPHAGOGASTRODUODENOSCOPY (EGD) WITH PROPOFOL  N/A 01/22/2016   Procedure: ESOPHAGOGASTRODUODENOSCOPY (EGD) WITH PROPOFOL ;  Surgeon: Gladis RAYMOND Mariner, MD;  Location: [facility] ENDOSCOPY;  Service: Endoscopy;  Laterality: N/A;   ESOPHAGOGASTRODUODENOSCOPY (EGD) WITH PROPOFOL  N/A 01/06/2020   Procedure: ESOPHAGOGASTRODUODENOSCOPY (EGD) WITH PROPOFOL ;  Surgeon: Toledo, Ladell POUR, MD;  Location: [facility] ENDOSCOPY;  Service: Gastroenterology;  Laterality: N/A;   FLEXIBLE BRONCHOSCOPY Bilateral 03/13/2023   Procedure: FLEXIBLE BRONCHOSCOPY;  Surgeon: Isaiah Scrivener, MD;  Location: [facility] ORS;  Service: Cardiopulmonary;  Laterality: Bilateral;   GASTRIC BYPASS  2005   HERNIA REPAIR     IR BONE MARROW BIOPSY & ASPIRATION  02/05/2023   NASAL SINUS SURGERY     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   TOTAL THYROIDECTOMY  TUBAL LIGATION      Social History   Socioeconomic History   Marital status: Divorced    Spouse name: Not on file   Number of children: 2   Years of education: Not on file   Highest education level: GED or equivalent  Occupational History   Occupation: Part-time  Tobacco Use   Smoking status: Former    Types: Cigarettes   Smokeless tobacco: Never   Tobacco comments:    Quit smoking 02/16/2023  Vaping Use   Vaping status: Never Used  Substance and Sexual Activity   Alcohol use: Yes    Alcohol/week: 10.0 - 12.0 standard drinks of alcohol    Types: 8 - 10 Glasses of wine, 2 Standard drinks or equivalent per week   Drug use: No   Sexual activity: Yes     Partners: Male  Other Topics Concern   Not on file  Social History Narrative   Lives in [place] single, divorced       Work - 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       Diet - healthy diet   Exercise - limited   Caffeine  use: daily   Social Drivers of Corporate Investment Banker Strain: Low Risk  (11/01/2022)   Overall Financial Resource Strain (CARDIA)    Difficulty of Paying Living Expenses: Not very hard  Food Insecurity: No Food Insecurity (03/18/2023)   Hunger Vital Sign    Worried About Running Out of Food in the Last Year: Never true    Ran Out of Food in the Last Year: Never true  Transportation Needs: No Transportation Needs (03/18/2023)   PRAPARE - Administrator, Civil Service (Medical): No    Lack of Transportation (Non-Medical): No  Physical Activity: Insufficiently Active (11/01/2022)   Exercise Vital Sign    Days of Exercise per Week: 3 days    Minutes of Exercise per Session: 20 min  Stress: Stress Concern Present (03/04/2023)   [employer] of Occupational Health - Occupational Stress Questionnaire    Feeling of Stress : Very much  Social Connections: Socially Isolated (03/04/2023)   Social Connection and Isolation Panel [NHANES]    Frequency of Communication with Friends and Family: Never    Frequency of Social Gatherings with Friends and Family: Never    Attends Religious Services: Never    Database Administrator or Organizations: No    Attends Banker Meetings: Never    Marital Status: Divorced  Catering Manager Violence: Not At Risk (03/06/2023)   Humiliation, Afraid, Rape, and Kick questionnaire    Fear of Current or Ex-Partner: No    Emotionally Abused: No    Physically Abused: No    Sexually Abused: No    Family History  Problem Relation Age of Onset   Heart disease Mother    COPD Mother    Arthritis Mother    Cancer Mother        uterine   Lupus Mother    Anxiety disorder Mother    Depression Mother    Drug  abuse Mother    COPD Father    Arthritis Father    Heart disease Father    Heart attack Father    Alcohol abuse Father    Heart disease Brother    Heart attack Brother    Drug abuse Brother    Anxiety disorder Brother    Depression Brother    Heart disease Brother    Cancer Brother  liver cancer age 50 y.o   Diabetes Maternal Grandmother    Diabetes Maternal Grandfather    Diabetes Paternal Grandmother    Diabetes Paternal Grandfather    Breast cancer Neg Hx      Current Outpatient Medications:    acyclovir  (ZOVIRAX ) 400 MG tablet, Take 1 tablet (400 mg total) by mouth 2 (two) times daily., Disp: 60 tablet, Rfl: 5   amLODipine  (NORVASC ) 10 MG tablet, Take 1 tablet (10 mg total) by mouth daily., Disp: 90 tablet, Rfl: 3   amoxicillin -clavulanate (AUGMENTIN ) 875-125 MG tablet, Take 1 tablet by mouth 2 (two) times daily., Disp: 14 tablet, Rfl: 0   aspirin  EC 81 MG tablet, Take 1 tablet (81 mg total) by mouth daily., Disp: 100 tablet, Rfl: 3   butalbital -acetaminophen -caffeine  (FIORICET ) 50-325-40 MG tablet, TAKE 1 TABLET BY MOUTH EVERY 4 HOURS AS NEEDED FOR HEADACHE, Disp: 14 tablet, Rfl: 3   Cholecalciferol  (VITAMIN D3) 50 MCG (2000 UT) CAPS, Take by mouth., Disp: , Rfl:    clonazePAM  (KLONOPIN ) 1 MG tablet, TAKE 1 TABLET(1 MG) BY MOUTH TWICE DAILY, Disp: 60 tablet, Rfl: 2   cyanocobalamin  (VITAMIN B12) 1000 MCG/ML injection, INJECT 1 ML IN THE MUSCLE EVERY 30 DAYS, Disp: 4 mL, Rfl: 12   cyclobenzaprine  (FLEXERIL ) 10 MG tablet, Take 0.5-1 tablets (5-10 mg total) by mouth 3 (three) times daily as needed for muscle spasms., Disp: 60 tablet, Rfl: 1   dexamethasone  (DECADRON ) 4 MG tablet, Take 5 tabs (20 mg) weekly the day after daratumumab  for 12 weeks. Take with breakfast., Disp: 20 tablet, Rfl: 5   escitalopram  (LEXAPRO ) 10 MG tablet, Take 1 tablet (10 mg total) by mouth daily., Disp: 30 tablet, Rfl: 2   HYDROmorphone  (DILAUDID ) 2 MG tablet, Take 2 tablets (4 mg total) by mouth  every 6 (six) hours as needed for severe pain (pain score 7-10)., Disp: 112 tablet, Rfl: 0   ipratropium-albuterol  (DUONEB) 0.5-2.5 (3) MG/3ML SOLN, Take 3 mLs by nebulization 4 (four) times daily., Disp: 360 mL, Rfl: 0   lenalidomide  (REVLIMID ) 20 MG capsule, Take 1 capsule (20 mg total) by mouth daily. Take 1 capsule every day for 14 day and then hold for 1 week.  [employer]- [id number] obtained 05/26/2023, Disp: 14 capsule, Rfl: 0   levothyroxine  (SYNTHROID ) 125 MCG tablet, Take 2 tablets (250 mcg total) by mouth daily before breakfast., Disp: 60 tablet, Rfl: 0   lidocaine -prilocaine  (EMLA ) cream, Apply 1 Application topically once., Disp: , Rfl:    ondansetron  (ZOFRAN ) 8 MG tablet, Take 1 tablet (8 mg total) by mouth every 8 (eight) hours as needed for nausea or vomiting., Disp: 30 tablet, Rfl: 1   pantoprazole  (PROTONIX ) 40 MG tablet, 30 min before lunch or dinner, Disp: 90 tablet, Rfl: 3   prochlorperazine  (COMPAZINE ) 10 MG tablet, Take 1 tablet (10 mg total) by mouth every 6 (six) hours as needed for nausea or vomiting., Disp: 30 tablet, Rfl: 1   senna-docusate (SENOKOT-S) 8.6-50 MG tablet, Take 1 tablet by mouth 2 (two) times daily., Disp: 30 tablet, Rfl: 0   Spacer/Aero-Holding Chambers (AEROCHAMBER MV) inhaler, Use as instructed, Disp: 1 each, Rfl: 0   SYRINGE-NEEDLE, DISP, 3 ML 25G X 1-1/2 3 ML MISC, 1 Device by Does not apply route every 30 (thirty) days. With B12 shot, Disp: 12 each, Rfl: 1   thiamine  (VITAMIN B-1) 100 MG tablet, Take 1 tablet (100 mg total) by mouth daily., Disp: 30 tablet, Rfl: 0   zolpidem  (AMBIEN ) 10 MG tablet, TAKE 1/2  TO 1 TABLET(5 TO 10 MG) BY MOUTH AT BEDTIME AS NEEDED FOR SLEEP, Disp: 30 tablet, Rfl: 2   docusate sodium  (COLACE) 100 MG capsule, Take 1 capsule (100 mg total) by mouth 2 (two) times daily. (Patient not taking: Reported on 05/26/2023), Disp: 10 capsule, Rfl: 0   polyethylene glycol (MIRALAX  / GLYCOLAX ) 17 g packet, Take 17 g by mouth 2 (two) times daily.  (Patient not taking: Reported on 05/26/2023), Disp: 14 each, Rfl: 0  Physical exam:  Vitals:   06/23/23 0851  BP: (!) 158/98  Pulse: 78  Resp: 20  Temp: 97.6 F (36.4 C)  SpO2: 100%  Weight: 173 lb 4.8 oz (78.6 kg)   Physical Exam Constitutional:      Appearance: She is not ill-appearing.  Cardiovascular:     Rate and Rhythm: Normal rate and regular rhythm.  Pulmonary:     Effort: Pulmonary effort is normal.     Breath sounds: Normal breath sounds.  Skin:    General: Skin is warm and dry.     Findings: No rash.  Neurological:     Mental Status: She is alert and oriented to person, place, and time.  Psychiatric:        Mood and Affect: Mood normal.        Behavior: Behavior normal.        Latest Ref Rng & Units 06/23/2023    8:38 AM  CMP  Glucose 70 - 99 mg/dL 870   BUN 6 - 20 mg/dL 11   Creatinine 9.55 - 1.00 mg/dL 8.80   Sodium 864 - 854 mmol/L 134   Potassium 3.5 - 5.1 mmol/L 3.6   Chloride 98 - 111 mmol/L 104   CO2 22 - 32 mmol/L 20   Calcium  8.9 - 10.3 mg/dL 6.6   Total Protein 6.5 - 8.1 g/dL 6.3   Total Bilirubin 0.0 - 1.2 mg/dL 0.4   Alkaline Phos 38 - 126 U/L 71   AST 15 - 41 U/L 20   ALT 0 - 44 U/L 14       Latest Ref Rng & Units 06/23/2023    8:38 AM  CBC  WBC 4.0 - 10.5 K/uL 6.5   Hemoglobin 12.0 - 15.0 g/dL 88.8   Hematocrit 63.9 - 46.0 % 33.6   Platelets 150 - 400 K/uL 283     Assessment and plan- Patient is a 58 y.o. female   Standard risk IgG lambda multiple myeloma R-ISS stage II. 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 She will see Dr Melanee for c6d1. Xgeva  on hold d/t hypocalcemia. Consider restarting if calcium  > 8.5.  Treatment assessment prior to cycle 5-day 1 of D RVD chemotherapy- labs reviewed. Proceed with DRVD chemotherapy today. She is also on revlimid  20 mg 2 weeks on and 1 week off which she will continue. Will work on her refill today.  Hypocalcemia- corrected calcium  6.7 today.  D/t denosumab  which is held. She will continue oral calcium  1200 mg twice a day. She has not started oral vitamin d . Recommend vitamin d  1000 mcg twice daily. Plan for calcium  gluconate 2 g IV today.  Neoplasm related pain: Continue as needed Dilaudid .  She will be getting repeat PET [facility] Prophylaxis- continue aspirin  81 mg and acyclovir .   Disposition:  Treatment today Add calcium  gluconate 2 g IV today Refill revlimid  Follow up per IS. Add possible calcium  to each infusion- la  Visit Diagnosis 1. Hypocalcemia   2. Multiple myeloma not having achieved remission (HCC)   3. Encounter for antineoplastic chemotherapy    Tinnie Dawn, DNP, AGNP-C, [facility] [phone] (clinic) 06/23/2023

## 2023-06-24 ENCOUNTER — Encounter: Payer: Self-pay | Admitting: Oncology

## 2023-06-26 ENCOUNTER — Inpatient Hospital Stay: Payer: Managed Care, Other (non HMO)

## 2023-06-26 VITALS — BP 136/79 | HR 79 | Temp 97.5°F | Resp 18 | Wt 173.5 lb

## 2023-06-26 DIAGNOSIS — Z5112 Encounter for antineoplastic immunotherapy: Secondary | ICD-10-CM | POA: Diagnosis not present

## 2023-06-26 DIAGNOSIS — C9000 Multiple myeloma not having achieved remission: Secondary | ICD-10-CM

## 2023-06-26 MED ORDER — PROCHLORPERAZINE MALEATE 10 MG PO TABS
10.0000 mg | ORAL_TABLET | Freq: Once | ORAL | Status: AC
  Administered 2023-06-26: 10 mg via ORAL
  Filled 2023-06-26: qty 1

## 2023-06-26 MED ORDER — BORTEZOMIB CHEMO SQ INJECTION 3.5 MG (2.5MG/ML)
1.3000 mg/m2 | Freq: Once | INTRAMUSCULAR | Status: AC
Start: 2023-06-26 — End: 2023-06-26
  Administered 2023-06-26: 2.25 mg via SUBCUTANEOUS
  Filled 2023-06-26: qty 0.9

## 2023-06-27 ENCOUNTER — Other Ambulatory Visit: Payer: Self-pay | Admitting: *Deleted

## 2023-06-27 DIAGNOSIS — C9000 Multiple myeloma not having achieved remission: Secondary | ICD-10-CM

## 2023-06-27 LAB — MULTIPLE MYELOMA PANEL, SERUM
Albumin SerPl Elph-Mcnc: 3.1 g/dL (ref 2.9–4.4)
Albumin/Glob SerPl: 1.2 (ref 0.7–1.7)
Alpha 1: 0.3 g/dL (ref 0.0–0.4)
Alpha2 Glob SerPl Elph-Mcnc: 0.8 g/dL (ref 0.4–1.0)
B-Globulin SerPl Elph-Mcnc: 1.1 g/dL (ref 0.7–1.3)
Gamma Glob SerPl Elph-Mcnc: 0.4 g/dL (ref 0.4–1.8)
Globulin, Total: 2.6 g/dL (ref 2.2–3.9)
IgA: 25 mg/dL — ABNORMAL LOW (ref 87–352)
IgG (Immunoglobin G), Serum: 730 mg/dL (ref 586–1602)
IgM (Immunoglobulin M), Srm: 35 mg/dL (ref 26–217)
M Protein SerPl Elph-Mcnc: 0.1 g/dL — ABNORMAL HIGH
Total Protein ELP: 5.7 g/dL — ABNORMAL LOW (ref 6.0–8.5)

## 2023-06-27 MED ORDER — PROCHLORPERAZINE MALEATE 10 MG PO TABS: 10.0000 mg | ORAL_TABLET | Freq: Four times a day (QID) | ORAL | 1 refills | Status: DC | PRN

## 2023-06-30 ENCOUNTER — Inpatient Hospital Stay (HOSPITAL_BASED_OUTPATIENT_CLINIC_OR_DEPARTMENT_OTHER): Payer: Managed Care, Other (non HMO) | Admitting: Oncology

## 2023-06-30 ENCOUNTER — Inpatient Hospital Stay: Payer: Managed Care, Other (non HMO)

## 2023-06-30 ENCOUNTER — Encounter: Payer: Self-pay | Admitting: Oncology

## 2023-06-30 ENCOUNTER — Other Ambulatory Visit: Payer: Self-pay | Admitting: *Deleted

## 2023-06-30 VITALS — BP 133/79 | HR 74 | Temp 98.8°F | Resp 18 | Wt 174.0 lb

## 2023-06-30 DIAGNOSIS — G893 Neoplasm related pain (acute) (chronic): Secondary | ICD-10-CM | POA: Diagnosis not present

## 2023-06-30 DIAGNOSIS — D702 Other drug-induced agranulocytosis: Secondary | ICD-10-CM

## 2023-06-30 DIAGNOSIS — C9000 Multiple myeloma not having achieved remission: Secondary | ICD-10-CM

## 2023-06-30 DIAGNOSIS — C9001 Multiple myeloma in remission: Secondary | ICD-10-CM | POA: Diagnosis not present

## 2023-06-30 DIAGNOSIS — Z79899 Other long term (current) drug therapy: Secondary | ICD-10-CM | POA: Diagnosis not present

## 2023-06-30 DIAGNOSIS — Z5111 Encounter for antineoplastic chemotherapy: Secondary | ICD-10-CM

## 2023-06-30 DIAGNOSIS — Z5112 Encounter for antineoplastic immunotherapy: Secondary | ICD-10-CM | POA: Diagnosis not present

## 2023-06-30 LAB — CBC WITH DIFFERENTIAL (CANCER CENTER ONLY)
Abs Immature Granulocytes: 0.01 10*3/uL (ref 0.00–0.07)
Basophils Absolute: 0.1 10*3/uL (ref 0.0–0.1)
Basophils Relative: 1 %
Eosinophils Absolute: 1.4 10*3/uL — ABNORMAL HIGH (ref 0.0–0.5)
Eosinophils Relative: 32 %
HCT: 35 % — ABNORMAL LOW (ref 36.0–46.0)
Hemoglobin: 11.5 g/dL — ABNORMAL LOW (ref 12.0–15.0)
Immature Granulocytes: 0 %
Lymphocytes Relative: 29 %
Lymphs Abs: 1.3 10*3/uL (ref 0.7–4.0)
MCH: 27.8 pg (ref 26.0–34.0)
MCHC: 32.9 g/dL (ref 30.0–36.0)
MCV: 84.7 fL (ref 80.0–100.0)
Monocytes Absolute: 0.3 10*3/uL (ref 0.1–1.0)
Monocytes Relative: 7 %
Neutro Abs: 1.3 10*3/uL — ABNORMAL LOW (ref 1.7–7.7)
Neutrophils Relative %: 31 %
Platelet Count: 208 10*3/uL (ref 150–400)
RBC: 4.13 MIL/uL (ref 3.87–5.11)
RDW: 14.2 % (ref 11.5–15.5)
WBC Count: 4.3 10*3/uL (ref 4.0–10.5)
nRBC: 0 % (ref 0.0–0.2)

## 2023-06-30 LAB — CMP (CANCER CENTER ONLY)
ALT: 18 U/L (ref 0–44)
AST: 20 U/L (ref 15–41)
Albumin: 3.7 g/dL (ref 3.5–5.0)
Alkaline Phosphatase: 72 U/L (ref 38–126)
Anion gap: 9 (ref 5–15)
BUN: 13 mg/dL (ref 6–20)
CO2: 24 mmol/L (ref 22–32)
Calcium: 7 mg/dL — ABNORMAL LOW (ref 8.9–10.3)
Chloride: 101 mmol/L (ref 98–111)
Creatinine: 1.03 mg/dL — ABNORMAL HIGH (ref 0.44–1.00)
GFR, Estimated: >60 mL/min (ref >60)
Glucose, Bld: 107 mg/dL — ABNORMAL HIGH (ref 70–99)
Potassium: 3.6 mmol/L (ref 3.5–5.1)
Sodium: 134 mmol/L — ABNORMAL LOW (ref 135–145)
Total Bilirubin: 0.3 mg/dL (ref 0.0–1.2)
Total Protein: 6.6 g/dL (ref 6.5–8.1)

## 2023-06-30 MED ORDER — PROCHLORPERAZINE MALEATE 10 MG PO TABS
10.0000 mg | ORAL_TABLET | Freq: Once | ORAL | Status: AC
  Administered 2023-06-30: 10 mg via ORAL
  Filled 2023-06-30: qty 1

## 2023-06-30 MED ORDER — BORTEZOMIB CHEMO SQ INJECTION 3.5 MG (2.5MG/ML)
1.3000 mg/m2 | Freq: Once | INTRAMUSCULAR | Status: AC
Start: 2023-06-30 — End: 2023-06-30
  Administered 2023-06-30: 2.25 mg via SUBCUTANEOUS
  Filled 2023-06-30: qty 0.9

## 2023-06-30 NOTE — Addendum Note (Signed)
 Addended by: Corene Cornea on: 06/30/2023 10:04 AM   Modules accepted: Orders

## 2023-06-30 NOTE — Progress Notes (Signed)
 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 She was last seen by me in April 2024 and at that time she had complained of mild left chest wall pain radiating to her back.  She underwent cardiac workup and I had done a bone survey at that time which did not show any evidence of bone disease.  Her smoldering multiple myeloma at that time was stable.  Subsequently she has had chest x-ray in May 2024 as well as a rib x-ray in July 2024.  None of these showed any abnormalities.  Her pain has gradually worsened in the last 2 months but especially so in the last 1 week.  She presented to the ER with symptoms of pleuritic chest wall pain and shortness of breath and underwent a CT angio chest which showed a soft tissue mass with bone destruction involving the left seventh rib as well as T6 and T7 vertebral bodies and central canal.  Possibility of cord compression.  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   Results of myeloma work-up from 06/20/2020 showed an  elevated IgG of 3668.  M protein was elevated at 3.1 and IFE showed IgG monoclonal lambda protein.  Beta-2  microglobulin and LDH were normal.  Serum free light chain ratio was elevated at 25 with a free light chain lambda of 303.  Bone marrow biopsy showed increased plasma cells 22% by manual count and 20% by IHC staining for CD138.  Cytogenetics for myeloma showed gain of 1 q.   MRI cervical and lumbar thoracic spine did not show any evidence of lytic lesions.  Bone survey was negative for bone lesions as well.  MRI pelvis with and without contrast showed diffuse patchy heterogeneous marrow concerning for myeloma    Patient had a repeat bone marrow biopsy inAugust 2024 which showed 20% overall plasma cells and hypercellular marrow consistent with plasma cell myeloma.  Cytogenetics normal and FISH studies did not show any abnormalities.  Patient had stabilization procedure/spinal fusion T4-T9 with T6-T7 laminotomies.  Spinal mass biopsy was consistent with plasma cell neoplasm. PET CT scan showed solitary hypermetabolic lytic lesion in the L4 vertebral body consistent with multiple myeloma.  Residual soft tissue density at the left paraspinal resection site T6-T7 with no significant metabolic activity.   Plan was to do outpatient daratumumab  Revlimid  Velcade  dexamethasone  regimen.  However patient was found to be in acute kidney failure with a creatinine of 5 in August 2024 and was sent to the ER.  She received cycle 1 of CyBorD in the hospital with improvement in her creatinine with stabilized between 1.5-1.7.  Treatment  has been complicated by repeated hospitalizations for acute hypoxic respiratory failure and left pleural effusion which has been treated with antibiotics and thoracentesis.    Subsequently patient's clinical condition improved and she started D VRD regimen as per [facility] protocol on 03/31/2023  Interval history-she still has ongoing back pain as well as on and off headaches for which she is  using as needed Dilaudid .  Also reports constant Headaches in the last couple of weeks she has been on Fioricet  for headaches but it has not been helping her.  ECOG PS- 1 Pain scale- 3 Opioid associated constipation- no  Review of systems- Review of Systems  Constitutional:  Positive for malaise/fatigue. Negative for chills, fever and weight loss.  HENT:  Negative for congestion, ear discharge and nosebleeds.   Eyes:  Negative for blurred vision.  Respiratory:  Negative for cough, hemoptysis, sputum production, shortness of breath and wheezing.   Cardiovascular:  Negative for chest pain, palpitations, orthopnea and claudication.  Gastrointestinal:  Negative for abdominal pain, blood in stool, constipation, diarrhea, heartburn, melena, nausea and vomiting.  Genitourinary:  Negative for dysuria, flank pain, frequency, hematuria and urgency.  Musculoskeletal:  Positive for back pain. Negative for joint pain and myalgias.  Skin:  Negative for rash.  Neurological:  Positive for headaches. Negative for dizziness, tingling, focal weakness, seizures and weakness.  Endo/Heme/Allergies:  Does not bruise/bleed easily.  Psychiatric/Behavioral:  Negative for depression and suicidal ideas. The patient does not have insomnia.       Allergies  Allergen Reactions   Hctz [Hydrochlorothiazide ]     Hyponatremia      Past Medical History:  Diagnosis Date   Anxiety    Arthritis    Asthma    Barrett's esophagus 2015   COVID-19    03/10/20   Depression    GERD (gastroesophageal reflux disease)    Hypertension    Hypothyroidism    Pneumonia    Smoldering myeloma    Thyroid  disease      Past Surgical History:  Procedure Laterality Date   ABLATION  2006   APPLICATION OF INTRAOPERATIVE CT SCAN N/A 01/28/2023   Procedure: APPLICATION OF INTRAOPERATIVE CT SCAN;  Surgeon: Claudene Penne Lin, MD;  Location: [facility] ORS;  Service: Neurosurgery;  Laterality: N/A;   BREAST CYST ASPIRATION Left 1998    BREAST SURGERY     cyst aspiration   BREAST SURGERY  1998   cyst aspiration   COLONOSCOPY  03/2014   COLONOSCOPY WITH PROPOFOL  N/A 01/06/2020   Procedure: COLONOSCOPY WITH PROPOFOL ;  Surgeon: Toledo, Ladell POUR, MD;  Location: [facility] ENDOSCOPY;  Service: Gastroenterology;  Laterality: N/A;   ENDOMETRIAL ABLATION     ESOPHAGOGASTRODUODENOSCOPY (EGD) WITH PROPOFOL  N/A 08/15/2015   Procedure: ESOPHAGOGASTRODUODENOSCOPY (EGD) WITH PROPOFOL ;  Surgeon: Gladis RAYMOND Mariner, MD;  Location: [facility] ENDOSCOPY;  Service: Endoscopy;  Laterality: N/A;   ESOPHAGOGASTRODUODENOSCOPY (EGD) WITH PROPOFOL  N/A 01/22/2016   Procedure: ESOPHAGOGASTRODUODENOSCOPY (EGD) WITH PROPOFOL ;  Surgeon: Gladis RAYMOND Mariner, MD;  Location: [facility] ENDOSCOPY;  Service: Endoscopy;  Laterality: N/A;   ESOPHAGOGASTRODUODENOSCOPY (EGD) WITH PROPOFOL  N/A 01/06/2020   Procedure: ESOPHAGOGASTRODUODENOSCOPY (EGD) WITH PROPOFOL ;  Surgeon: Toledo, Ladell POUR, MD;  Location: [facility] ENDOSCOPY;  Service: Gastroenterology;  Laterality: N/A;   FLEXIBLE BRONCHOSCOPY Bilateral 03/13/2023   Procedure: FLEXIBLE BRONCHOSCOPY;  Surgeon: Isaiah Scrivener, MD;  Location: [facility] ORS;  Service: Cardiopulmonary;  Laterality: Bilateral;   GASTRIC BYPASS  2005   HERNIA REPAIR     IR BONE MARROW BIOPSY & ASPIRATION  02/05/2023   NASAL  SINUS SURGERY     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   TOTAL THYROIDECTOMY     TUBAL LIGATION      Social History   Socioeconomic History   Marital status: Divorced    Spouse name: Not on file   Number of children: 2   Years of education: Not on file   Highest education level: GED or equivalent  Occupational History   Occupation: Part-time  Tobacco Use   Smoking status: Former    Types: Cigarettes   Smokeless tobacco: Never   Tobacco comments:    Quit smoking 02/16/2023  Vaping Use   Vaping status: Never Used  Substance and Sexual Activity   Alcohol use: Yes    Alcohol/week: 10.0 - 12.0 standard drinks of alcohol    Types: 8  - 10 Glasses of wine, 2 Standard drinks or equivalent per week   Drug use: No   Sexual activity: Yes    Partners: Male  Other Topics Concern   Not on file  Social History Narrative   Lives in [place] single, divorced       Work - 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       Diet - healthy diet   Exercise - limited   Caffeine  use: daily   Social Drivers of Corporate Investment Banker Strain: Low Risk  (11/01/2022)   Overall Financial Resource Strain (CARDIA)    Difficulty of Paying Living Expenses: Not very hard  Food Insecurity: No Food Insecurity (03/18/2023)   Hunger Vital Sign    Worried About Running Out of Food in the Last Year: Never true    Ran Out of Food in the Last Year: Never true  Transportation Needs: No Transportation Needs (03/18/2023)   PRAPARE - Administrator, Civil Service (Medical): No    Lack of Transportation (Non-Medical): No  Physical Activity: Insufficiently Active (11/01/2022)   Exercise Vital Sign    Days of Exercise per Week: 3 days    Minutes of Exercise per Session: 20 min  Stress: Stress Concern Present (03/04/2023)   [employer] of Occupational Health - Occupational Stress Questionnaire    Feeling of Stress : Very much  Social Connections: Socially Isolated (03/04/2023)   Social Connection and Isolation Panel [NHANES]    Frequency of Communication with Friends and Family: Never    Frequency of Social Gatherings with Friends and Family: Never    Attends Religious Services: Never    Database Administrator or Organizations: No    Attends Banker Meetings: Never    Marital Status: Divorced  Catering Manager Violence: Not At Risk (03/06/2023)   Humiliation, Afraid, Rape, and Kick questionnaire    Fear of Current or Ex-Partner: No    Emotionally Abused: No    Physically Abused: No    Sexually Abused: No    Family History  Problem Relation Age of Onset   Heart disease Mother    COPD Mother    Arthritis Mother     Cancer Mother        uterine   Lupus Mother    Anxiety disorder Mother    Depression Mother    Drug abuse Mother    COPD Father    Arthritis Father    Heart disease Father    Heart attack Father    Alcohol abuse Father    Heart disease Brother    Heart attack Brother    Drug abuse Brother  Anxiety disorder Brother    Depression Brother    Heart disease Brother    Cancer Brother        liver cancer age 31 y.o   Diabetes Maternal Grandmother    Diabetes Maternal Grandfather    Diabetes Paternal Grandmother    Diabetes Paternal Grandfather    Breast cancer Neg Hx      Current Outpatient Medications:    acyclovir  (ZOVIRAX ) 400 MG tablet, Take 1 tablet (400 mg total) by mouth 2 (two) times daily., Disp: 60 tablet, Rfl: 5   amLODipine  (NORVASC ) 10 MG tablet, Take 1 tablet (10 mg total) by mouth daily., Disp: 90 tablet, Rfl: 3   amoxicillin -clavulanate (AUGMENTIN ) 875-125 MG tablet, Take 1 tablet by mouth 2 (two) times daily., Disp: 14 tablet, Rfl: 0   aspirin  EC 81 MG tablet, Take 1 tablet (81 mg total) by mouth daily., Disp: 100 tablet, Rfl: 3   butalbital -acetaminophen -caffeine  (FIORICET ) 50-325-40 MG tablet, TAKE 1 TABLET BY MOUTH EVERY 4 HOURS AS NEEDED FOR HEADACHE, Disp: 14 tablet, Rfl: 3   Cholecalciferol  (VITAMIN D3) 50 MCG (2000 UT) CAPS, Take by mouth., Disp: , Rfl:    clonazePAM  (KLONOPIN ) 1 MG tablet, TAKE 1 TABLET(1 MG) BY MOUTH TWICE DAILY, Disp: 60 tablet, Rfl: 2   cyanocobalamin  (VITAMIN B12) 1000 MCG/ML injection, INJECT 1 ML IN THE MUSCLE EVERY 30 DAYS, Disp: 4 mL, Rfl: 12   cyclobenzaprine  (FLEXERIL ) 10 MG tablet, Take 0.5-1 tablets (5-10 mg total) by mouth 3 (three) times daily as needed for muscle spasms., Disp: 60 tablet, Rfl: 1   dexamethasone  (DECADRON ) 4 MG tablet, Take 5 tabs (20 mg) weekly the day after daratumumab  for 12 weeks. Take with breakfast., Disp: 20 tablet, Rfl: 5   docusate sodium  (COLACE) 100 MG capsule, Take 1 capsule (100 mg total) by mouth 2  (two) times daily. (Patient not taking: Reported on 05/26/2023), Disp: 10 capsule, Rfl: 0   escitalopram  (LEXAPRO ) 10 MG tablet, Take 1 tablet (10 mg total) by mouth daily., Disp: 30 tablet, Rfl: 2   HYDROmorphone  (DILAUDID ) 2 MG tablet, Take 2 tablets (4 mg total) by mouth every 6 (six) hours as needed for severe pain (pain score 7-10)., Disp: 112 tablet, Rfl: 0   ipratropium-albuterol  (DUONEB) 0.5-2.5 (3) MG/3ML SOLN, Take 3 mLs by nebulization 4 (four) times daily., Disp: 360 mL, Rfl: 0   lenalidomide  (REVLIMID ) 20 MG capsule, Take 1 capsule (20 mg total) by mouth daily. Take 1 capsule every day for 14 day and then hold for 1 week.  [employer]- [id number]           obtained:06/23/2023, Disp: 14 capsule, Rfl: 0   levothyroxine  (SYNTHROID ) 125 MCG tablet, Take 2 tablets (250 mcg total) by mouth daily before breakfast., Disp: 60 tablet, Rfl: 0   lidocaine -prilocaine  (EMLA ) cream, Apply 1 Application topically once., Disp: , Rfl:    ondansetron  (ZOFRAN ) 8 MG tablet, Take 1 tablet (8 mg total) by mouth every 8 (eight) hours as needed for nausea or vomiting., Disp: 30 tablet, Rfl: 1   pantoprazole  (PROTONIX ) 40 MG tablet, 30 min before lunch or dinner, Disp: 90 tablet, Rfl: 3   polyethylene glycol (MIRALAX  / GLYCOLAX ) 17 g packet, Take 17 g by mouth 2 (two) times daily. (Patient not taking: Reported on 05/26/2023), Disp: 14 each, Rfl: 0   prochlorperazine  (COMPAZINE ) 10 MG tablet, Take 1 tablet (10 mg total) by mouth every 6 (six) hours as needed for nausea or vomiting., Disp: 30 tablet, Rfl: 1  senna-docusate (SENOKOT-S) 8.6-50 MG tablet, Take 1 tablet by mouth 2 (two) times daily., Disp: 30 tablet, Rfl: 0   Spacer/Aero-Holding Chambers (AEROCHAMBER MV) inhaler, Use as instructed, Disp: 1 each, Rfl: 0   SYRINGE-NEEDLE, DISP, 3 ML 25G X 1-1/2 3 ML MISC, 1 Device by Does not apply route every 30 (thirty) days. With B12 shot, Disp: 12 each, Rfl: 1   thiamine  (VITAMIN B-1) 100 MG tablet, Take 1 tablet (100 mg  total) by mouth daily., Disp: 30 tablet, Rfl: 0   zolpidem  (AMBIEN ) 10 MG tablet, TAKE 1/2 TO 1 TABLET(5 TO 10 MG) BY MOUTH AT BEDTIME AS NEEDED FOR SLEEP, Disp: 30 tablet, Rfl: 2  Physical exam:  Vitals:   06/30/23 0911  BP: 133/79  Pulse: 74  Resp: 18  Temp: 98.8 F (37.1 C)  TempSrc: Tympanic  SpO2: 98%  Weight: 174 lb (78.9 kg)   Physical Exam Cardiovascular:     Rate and Rhythm: Normal rate and regular rhythm.     Heart sounds: Normal heart sounds.  Pulmonary:     Effort: Pulmonary effort is normal.     Breath sounds: Normal breath sounds.  Abdominal:     General: Bowel sounds are normal.     Palpations: Abdomen is soft.  Skin:    General: Skin is warm and dry.  Neurological:     Mental Status: She is alert and oriented to person, place, and time.         Latest Ref Rng & Units 06/23/2023    8:38 AM  CMP  Glucose 70 - 99 mg/dL 870   BUN 6 - 20 mg/dL 11   Creatinine 9.55 - 1.00 mg/dL 8.80   Sodium 864 - 854 mmol/L 134   Potassium 3.5 - 5.1 mmol/L 3.6   Chloride 98 - 111 mmol/L 104   CO2 22 - 32 mmol/L 20   Calcium  8.9 - 10.3 mg/dL 6.6   Total Protein 6.5 - 8.1 g/dL 6.3   Total Bilirubin 0.0 - 1.2 mg/dL 0.4   Alkaline Phos 38 - 126 U/L 71   AST 15 - 41 U/L 20   ALT 0 - 44 U/L 14       Latest Ref Rng & Units 06/23/2023    8:38 AM  CBC  WBC 4.0 - 10.5 K/uL 6.5   Hemoglobin 12.0 - 15.0 g/dL 88.8   Hematocrit 63.9 - 46.0 % 33.6   Platelets 150 - 400 K/uL 283     Assessment and plan- Patient is a 58 y.o. female  with standard risk IgG lambda multiple myeloma R-ISS stage II.  She is here for on treatment assessment prior to cycle 5-day 8 of D RVD regimen  Counts okay to proceed with cycle 5-day 8 of 8 today.  Cycle 5 and cycle 6 will be her consolidation cycles and she will be receiving Darzalex  every 21 days and continue to receive Velcade  twice a week 2 weeks on and 1 week off.  She will continue with Revlimid  20 mg 2 weeks on and 1 week off.  We will reach  out to [facility] to see when we need to stop her ongoing treatments in anticipation of bone marrow transplant.  Repeat PET scans will be done in [facility] and she will likely get a repeat bone marrow biopsy there as well.  Based on her labs she is in very good partial remission at this time.  M protein is decreasing but still detectable on SPEP.  Serum free  light chain ratio has normalized I will see her back in 2 weeks for cycle 6-day 1 of D RVD treatment  Neoplasm related pain: Continue as needed Dilaudid .  It is unclear as to why her back pain is getting worse especially when her myeloma labs are improving.  She is scheduled for a PET scan in a week's time at [facility] and I will follow-up on that.  Patient is agreeable with this plan  Patient will also continue with low-dose aspirin  and acyclovir  prophylaxis.  Xgeva  is currently on hold due to ongoing hypocalcemia.  Clinically patient is asymptomatic and I have asked her to increase her oral intake of calcium  from 2 times a day to 3 times a day.   Visit Diagnosis 1. Encounter for antineoplastic chemotherapy   2. High risk medication use   3. Multiple myeloma in remission [facility])      Dr. Annah Skene, MD, MPH [facility] at [facility] [phone] 06/30/2023 8:44 AM

## 2023-06-30 NOTE — Patient Instructions (Signed)
 [facility] - A DEPT OF [facility]  Discharge Instructions: Thank you for choosing [facility] to provide your oncology and hematology care.  If you have a lab appointment with the Cancer Center, please go directly to the Cancer Center and check in at the registration area.  Wear comfortable clothing and clothing appropriate for easy access to any Portacath or PICC line.   We strive to give you quality time with your provider. You may need to reschedule your appointment if you arrive late (15 or more minutes).  Arriving late affects you and other patients whose appointments are after yours.  Also, if you miss three or more appointments without notifying the office, you may be dismissed from the clinic at the provider's discretion.      For prescription refill requests, have your pharmacy contact our office and allow 72 hours for refills to be completed.    Today you received the following chemotherapy and/or immunotherapy agents Velcade       To help prevent nausea and vomiting after your treatment, we encourage you to take your nausea medication as directed.  BELOW ARE SYMPTOMS THAT SHOULD BE REPORTED IMMEDIATELY: *FEVER GREATER THAN 100.4 F (38 C) OR HIGHER *CHILLS OR SWEATING *NAUSEA AND VOMITING THAT IS NOT CONTROLLED WITH YOUR NAUSEA MEDICATION *UNUSUAL SHORTNESS OF BREATH *UNUSUAL BRUISING OR BLEEDING *URINARY PROBLEMS (pain or burning when urinating, or frequent urination) *BOWEL PROBLEMS (unusual diarrhea, constipation, pain near the anus) TENDERNESS IN MOUTH AND THROAT WITH OR WITHOUT PRESENCE OF ULCERS (sore throat, sores in mouth, or a toothache) UNUSUAL RASH, SWELLING OR PAIN  UNUSUAL VAGINAL DISCHARGE OR ITCHING   Items with * indicate a potential emergency and should be followed up as soon as possible or go to the Emergency Department if any problems should occur.  Please show the CHEMOTHERAPY ALERT CARD or IMMUNOTHERAPY  ALERT CARD at check-in to the Emergency Department and triage nurse.  Should you have questions after your visit or need to cancel or reschedule your appointment, please contact [facility] - A DEPT OF [facility]  [phone] and follow the prompts.  Office hours are 8:00 a.m. to 4:30 p.m. Monday - Friday. Please note that voicemails left after 4:00 p.m. may not be returned until the following business day.  We are closed weekends and major holidays. You have access to a nurse at all times for urgent questions. Please call the main number to the clinic [phone] and follow the prompts.  For any non-urgent questions, you may also contact your provider using MyChart. We now offer e-Visits for anyone 63 and older to request care online for non-urgent symptoms. For details visit [email or web address].   Also download the MyChart app! Go to the app store, search MyChart, open the app, select [facility], and log in with your MyChart username and password.

## 2023-07-01 ENCOUNTER — Encounter: Payer: Self-pay | Admitting: Oncology

## 2023-07-01 MED ORDER — HYDROMORPHONE HCL 2 MG PO TABS: 4.0000 mg | ORAL_TABLET | Freq: Four times a day (QID) | ORAL | 0 refills | Status: DC | PRN

## 2023-07-03 ENCOUNTER — Other Ambulatory Visit: Payer: Self-pay | Admitting: Hospice and Palliative Medicine

## 2023-07-03 ENCOUNTER — Other Ambulatory Visit: Payer: Self-pay

## 2023-07-03 ENCOUNTER — Inpatient Hospital Stay: Payer: Managed Care, Other (non HMO)

## 2023-07-03 VITALS — BP 149/74 | HR 81 | Temp 97.4°F | Resp 18

## 2023-07-03 DIAGNOSIS — Z5112 Encounter for antineoplastic immunotherapy: Secondary | ICD-10-CM | POA: Diagnosis not present

## 2023-07-03 DIAGNOSIS — C9000 Multiple myeloma not having achieved remission: Secondary | ICD-10-CM

## 2023-07-03 MED ORDER — HYDROMORPHONE HCL 1 MG/ML IJ SOLN
1.0000 mg | Freq: Once | INTRAMUSCULAR | Status: AC
  Administered 2023-07-03: 1 mg via INTRAVENOUS
  Filled 2023-07-03: qty 1

## 2023-07-03 MED ORDER — HYDROMORPHONE HCL 4 MG PO TABS
4.0000 mg | ORAL_TABLET | ORAL | 0 refills | Status: DC | PRN
  Filled 2023-07-03: qty 50, 9d supply, fill #0

## 2023-07-03 MED ORDER — PROCHLORPERAZINE MALEATE 10 MG PO TABS
10.0000 mg | ORAL_TABLET | Freq: Once | ORAL | Status: AC
  Administered 2023-07-03: 10 mg via ORAL
  Filled 2023-07-03: qty 1

## 2023-07-03 MED ORDER — BORTEZOMIB CHEMO SQ INJECTION 3.5 MG (2.5MG/ML)
1.3000 mg/m2 | Freq: Once | INTRAMUSCULAR | Status: AC
Start: 2023-07-03 — End: 2023-07-03
  Administered 2023-07-03: 2.25 mg via SUBCUTANEOUS
  Filled 2023-07-03: qty 0.9

## 2023-07-03 NOTE — Progress Notes (Signed)
Was informed patient's pharmacy did not have hydromorphone in stock.  Patient now out of of pain medications.  She is taking 4 mg per dose so we will switch to 4 mg tablets to reduce pill burden.  Will send new Rx to [facility] pharmacy per patient request. PDMP reviewed.   Patient also in severe pain today.  She is here for infusion.  Will have RN give hydromorphone 1 mg IV x 1.

## 2023-07-03 NOTE — Patient Instructions (Signed)
 [facility] - A DEPT OF [facility]  Discharge Instructions: Thank you for choosing [facility] to provide your oncology and hematology care.  If you have a lab appointment with the Cancer Center, please go directly to the Cancer Center and check in at the registration area.  Wear comfortable clothing and clothing appropriate for easy access to any Portacath or PICC line.   We strive to give you quality time with your provider. You may need to reschedule your appointment if you arrive late (15 or more minutes).  Arriving late affects you and other patients whose appointments are after yours.  Also, if you miss three or more appointments without notifying the office, you may be dismissed from the clinic at the provider's discretion.      For prescription refill requests, have your pharmacy contact our office and allow 72 hours for refills to be completed.    Today you received the following chemotherapy and/or immunotherapy agents Velcade       To help prevent nausea and vomiting after your treatment, we encourage you to take your nausea medication as directed.  BELOW ARE SYMPTOMS THAT SHOULD BE REPORTED IMMEDIATELY: *FEVER GREATER THAN 100.4 F (38 C) OR HIGHER *CHILLS OR SWEATING *NAUSEA AND VOMITING THAT IS NOT CONTROLLED WITH YOUR NAUSEA MEDICATION *UNUSUAL SHORTNESS OF BREATH *UNUSUAL BRUISING OR BLEEDING *URINARY PROBLEMS (pain or burning when urinating, or frequent urination) *BOWEL PROBLEMS (unusual diarrhea, constipation, pain near the anus) TENDERNESS IN MOUTH AND THROAT WITH OR WITHOUT PRESENCE OF ULCERS (sore throat, sores in mouth, or a toothache) UNUSUAL RASH, SWELLING OR PAIN  UNUSUAL VAGINAL DISCHARGE OR ITCHING   Items with * indicate a potential emergency and should be followed up as soon as possible or go to the Emergency Department if any problems should occur.  Please show the CHEMOTHERAPY ALERT CARD or IMMUNOTHERAPY  ALERT CARD at check-in to the Emergency Department and triage nurse.  Should you have questions after your visit or need to cancel or reschedule your appointment, please contact [facility] - A DEPT OF [facility]  [phone] and follow the prompts.  Office hours are 8:00 a.m. to 4:30 p.m. Monday - Friday. Please note that voicemails left after 4:00 p.m. may not be returned until the following business day.  We are closed weekends and major holidays. You have access to a nurse at all times for urgent questions. Please call the main number to the clinic [phone] and follow the prompts.  For any non-urgent questions, you may also contact your provider using MyChart. We now offer e-Visits for anyone 3 and older to request care online for non-urgent symptoms. For details visit [email or web address].   Also download the MyChart app! Go to the app store, search "MyChart", open the app, select , and log in with your MyChart username and password.

## 2023-07-07 ENCOUNTER — Encounter: Payer: Self-pay | Admitting: Oncology

## 2023-07-08 ENCOUNTER — Other Ambulatory Visit: Payer: Self-pay | Admitting: Oncology

## 2023-07-08 ENCOUNTER — Other Ambulatory Visit: Payer: Self-pay

## 2023-07-08 DIAGNOSIS — G893 Neoplasm related pain (acute) (chronic): Secondary | ICD-10-CM

## 2023-07-08 DIAGNOSIS — C9000 Multiple myeloma not having achieved remission: Secondary | ICD-10-CM

## 2023-07-08 NOTE — Telephone Encounter (Signed)
Can cel existing treatment and appointments with me. She will see me mid may with cbc with diff, cmp, myeloma panel and free light chains.

## 2023-07-09 ENCOUNTER — Encounter: Payer: Self-pay | Admitting: Oncology

## 2023-07-09 NOTE — Progress Notes (Signed)
Patient consented and enrolled in [facility] services on 03/07/23. Patient scheduled initial behavioral health evaluation for 03/24/23.

## 2023-07-12 LAB — COLOGUARD: COLOGUARD: NEGATIVE

## 2023-07-14 ENCOUNTER — Other Ambulatory Visit: Payer: Managed Care, Other (non HMO)

## 2023-07-14 ENCOUNTER — Ambulatory Visit: Payer: Managed Care, Other (non HMO)

## 2023-07-14 ENCOUNTER — Ambulatory Visit: Payer: Managed Care, Other (non HMO) | Admitting: Oncology

## 2023-07-15 ENCOUNTER — Encounter: Payer: Self-pay | Admitting: Family Medicine

## 2023-07-15 ENCOUNTER — Encounter: Payer: Self-pay | Admitting: Hospice and Palliative Medicine

## 2023-07-16 ENCOUNTER — Other Ambulatory Visit: Payer: Self-pay

## 2023-07-16 MED ORDER — HYDROMORPHONE HCL 4 MG PO TABS: 4.0000 mg | ORAL_TABLET | ORAL | 0 refills | Status: DC | PRN

## 2023-07-16 NOTE — Telephone Encounter (Signed)
Refill request

## 2023-07-17 ENCOUNTER — Encounter: Payer: Self-pay | Admitting: Oncology

## 2023-07-17 ENCOUNTER — Ambulatory Visit: Payer: Managed Care, Other (non HMO)

## 2023-07-18 DIAGNOSIS — C9000 Multiple myeloma not having achieved remission: Secondary | ICD-10-CM | POA: Diagnosis not present

## 2023-07-18 DIAGNOSIS — F411 Generalized anxiety disorder: Secondary | ICD-10-CM | POA: Diagnosis not present

## 2023-07-18 DIAGNOSIS — F341 Dysthymic disorder: Secondary | ICD-10-CM | POA: Diagnosis not present

## 2023-07-18 DIAGNOSIS — F17200 Nicotine dependence, unspecified, uncomplicated: Secondary | ICD-10-CM | POA: Diagnosis not present

## 2023-07-21 ENCOUNTER — Other Ambulatory Visit: Payer: Managed Care, Other (non HMO)

## 2023-07-21 ENCOUNTER — Ambulatory Visit: Payer: Managed Care, Other (non HMO) | Admitting: Oncology

## 2023-07-21 ENCOUNTER — Ambulatory Visit: Payer: Managed Care, Other (non HMO)

## 2023-07-23 ENCOUNTER — Encounter: Payer: Self-pay | Admitting: Oncology

## 2023-07-24 ENCOUNTER — Ambulatory Visit: Payer: Managed Care, Other (non HMO)

## 2023-07-27 ENCOUNTER — Other Ambulatory Visit: Payer: Self-pay | Admitting: Hospice and Palliative Medicine

## 2023-07-28 ENCOUNTER — Encounter: Payer: Self-pay | Admitting: Oncology

## 2023-07-28 MED ORDER — HYDROMORPHONE HCL 4 MG PO TABS: 4.0000 mg | ORAL_TABLET | ORAL | 0 refills | Status: DC | PRN

## 2023-07-31 ENCOUNTER — Other Ambulatory Visit: Payer: Self-pay | Admitting: Nurse Practitioner

## 2023-07-31 DIAGNOSIS — C9000 Multiple myeloma not having achieved remission: Secondary | ICD-10-CM

## 2023-08-01 ENCOUNTER — Encounter: Payer: Self-pay | Admitting: Oncology

## 2023-08-01 ENCOUNTER — Other Ambulatory Visit: Payer: Self-pay

## 2023-08-01 DIAGNOSIS — C9000 Multiple myeloma not having achieved remission: Secondary | ICD-10-CM

## 2023-08-01 MED ORDER — LENALIDOMIDE 20 MG PO CAPS: 20.0000 mg | ORAL_CAPSULE | Freq: Every day | ORAL | 0 refills | Status: DC

## 2023-08-11 ENCOUNTER — Telehealth: Payer: Self-pay | Admitting: *Deleted

## 2023-08-11 NOTE — Telephone Encounter (Signed)
 Cancel all the revlimid because pt is on having a stem cell tranplant. We will need to call back when pt has to start back.

## 2023-08-15 DIAGNOSIS — F341 Dysthymic disorder: Secondary | ICD-10-CM | POA: Diagnosis not present

## 2023-08-15 DIAGNOSIS — F17200 Nicotine dependence, unspecified, uncomplicated: Secondary | ICD-10-CM | POA: Diagnosis not present

## 2023-08-15 DIAGNOSIS — C9000 Multiple myeloma not having achieved remission: Secondary | ICD-10-CM | POA: Diagnosis not present

## 2023-08-15 DIAGNOSIS — F411 Generalized anxiety disorder: Secondary | ICD-10-CM | POA: Diagnosis not present

## 2023-08-25 ENCOUNTER — Telehealth: Payer: Self-pay

## 2023-08-25 ENCOUNTER — Other Ambulatory Visit: Payer: Self-pay | Admitting: Family Medicine

## 2023-08-25 DIAGNOSIS — F419 Anxiety disorder, unspecified: Secondary | ICD-10-CM

## 2023-08-25 DIAGNOSIS — F5104 Psychophysiologic insomnia: Secondary | ICD-10-CM

## 2023-08-25 NOTE — Transitions of Care (Post Inpatient/ED Visit) (Signed)
   08/25/2023  Name: Paula Garrett MRN: [id number] DOB: January 14, 1966  Today's TOC FU Call Status: Today's TOC FU Call Status:: Unsuccessful Call (1st Attempt) Unsuccessful Call (1st Attempt) Date: 08/25/23  Attempted to reach the patient regarding the most recent Inpatient/ED visit.  Follow Up Plan: Additional outreach attempts will be made to reach the patient to complete the Transitions of Care (Post Inpatient/ED visit) call.   Deidre Ala, BSN, RN [place]  VBCI - [employer] [facility] RN Care Manager [phone]

## 2023-08-26 ENCOUNTER — Telehealth: Payer: Self-pay

## 2023-08-26 NOTE — Telephone Encounter (Signed)
 Requested medication (s) are due for refill today: yes  Requested medication (s) are on the active medication list: yes  Last refill:  06/02/23 for both   Future visit scheduled: yes  Notes to clinic:  Unable to refill per protocol, cannot delegate.      Requested Prescriptions  Pending Prescriptions Disp Refills   clonazePAM (KLONOPIN) 1 MG tablet [Pharmacy Med Name: CLONAZEPAM 1MG  TABLETS] 60 tablet     Sig: TAKE 1 TABLET(1 MG) BY MOUTH TWICE DAILY     Not Delegated - Psychiatry: Anxiolytics/Hypnotics 2 Failed - 08/26/2023  4:39 PM      Failed - This refill cannot be delegated      Passed - Urine Drug Screen completed in last 360 days      Passed - Patient is not pregnant      Passed - Valid encounter within last 6 months    Recent Outpatient Visits           3 months ago Benign essential hypertension   [place] [facility] [place], Netta Neat, DO   7 months ago MDD (major depressive disorder), recurrent episode, moderate [facility])   [place] [facility] Smitty Cords, DO   9 months ago Benign essential hypertension   [place] [facility] Althea Charon, Netta Neat, DO       Future Appointments             In 6 days Althea Charon, Netta Neat, DO [facility] [place], [facility]   In 2 months Raechel Chute, MD [facility] at [facility]             zolpidem (AMBIEN) 10 MG tablet [Pharmacy Med Name: ZOLPIDEM 10MG  TABLETS] 30 tablet     Sig: TAKE 1/2 TO 1 TABLET(5 TO 10 MG) BY MOUTH AT BEDTIME AS NEEDED FOR SLEEP     Not Delegated - Psychiatry:  Anxiolytics/Hypnotics Failed - 08/26/2023  4:39 PM      Failed - This refill cannot be delegated      Passed - Urine Drug Screen completed in last 360 days      Passed - Valid encounter within last 6 months    Recent Outpatient Visits           3 months ago Benign essential hypertension   [place] [facility] [place], Netta Neat, DO   7 months ago MDD (major depressive disorder), recurrent episode, moderate [facility])   [place] [facility] Smitty Cords, DO   9 months ago Benign essential hypertension   [place] [facility] [place], Netta Neat, DO       Future Appointments             In 6 days Althea Charon, Netta Neat, DO [place] [facility], [place]   In 2 months Raechel Chute, MD [facility] at [facility]

## 2023-08-26 NOTE — Transitions of Care (Post Inpatient/ED Visit) (Signed)
 08/26/2023  Name: Paula Garrett MRN: [id number] DOB: 1966-06-16  Today's TOC FU Call Status: Today's TOC FU Call Status:: Successful TOC FU Call Completed TOC FU Call Complete Date: 08/26/23 Patient's Name and Date of Birth confirmed.  Transition Care Management Follow-up Telephone Call Date of Discharge: 08/22/23 Discharge Facility: Other [facility]) Name of Other (Non[facility]) Discharge Facility: [facility] Type of Discharge: Inpatient Admission Primary Inpatient Discharge Diagnosis:: Stem Cell Transplant How have you been since you were released from the hospital?: Better Any questions or concerns?: No  Items Reviewed: Did you receive and understand the discharge instructions provided?: Yes Medications obtained,verified, and reconciled?: Yes (Medications Reviewed) Any new allergies since your discharge?: No Dietary orders reviewed?: Yes Type of Diet Ordered:: Immunetherapy Diet Do you have support at home?: Yes People in Home: alone Name of Support/Comfort Primary Source: Jannet Mantis, brother  Medications Reviewed Today: Medications Reviewed Today     Reviewed by Redge Gainer, RN (Case Manager) on 08/26/23 at 1332  Med List Status: <None>   Medication Order Taking? Sig Documenting Provider Last Dose Status Informant  acyclovir (ZOVIRAX) 400 MG tablet [id number]  Take 1 tablet (400 mg total) by mouth 2 (two) times daily. Creig Hines, MD  Active   amLODipine (NORVASC) 10 MG tablet [id number]  Take 1 tablet (10 mg total) by mouth daily. Kara Dies, NP  Active Other  amoxicillin-clavulanate (AUGMENTIN) 875-125 MG tablet [id number] No Take 1 tablet by mouth 2 (two) times daily.  Patient not taking: Reported on 08/26/2023   Creig Hines, MD Not Taking Active   aspirin EC 81 MG tablet [id number]  Take 1 tablet (81 mg total) by mouth daily. Creig Hines, MD  Active   butalbital-acetaminophen-caffeine (FIORICET) 9494971568 MG tablet [id number]  TAKE 1  TABLET BY MOUTH EVERY 4 HOURS AS NEEDED FOR HEADACHE Creig Hines, MD  Active   Cholecalciferol (VITAMIN D3) 50 MCG (2000 UT) CAPS [id number]  Take by mouth. [provider]  Active   clonazePAM (KLONOPIN) 1 MG tablet [id number]  TAKE 1 TABLET(1 MG) BY MOUTH TWICE DAILY Karamalegos, Netta Neat, DO  Active   cyanocobalamin (VITAMIN B12) 1000 MCG/ML injection [id number]  INJECT 1 ML IN THE MUSCLE EVERY 30 DAYS McLean-Scocuzza, Pasty Spillers, MD  Active Other           Med Note Judd Gaudier   Mon May 05, 2023  3:08 PM)    cyclobenzaprine (FLEXERIL) 10 MG tablet [id number]  Take 0.5-1 tablets (5-10 mg total) by mouth 3 (three) times daily as needed for muscle spasms. Smitty Cords, DO  Active Other  dexamethasone (DECADRON) 4 MG tablet [id number]  Take 5 tabs (20 mg) weekly the day after daratumumab for 12 weeks. Take with breakfast. Creig Hines, MD  Active   docusate sodium (COLACE) 100 MG capsule [id number]  Take 1 capsule (100 mg total) by mouth 2 (two) times daily.  Patient not taking: Reported on 05/26/2023   Enedina Finner, MD  Active Other  escitalopram (LEXAPRO) 10 MG tablet [id number]  Take 1 tablet (10 mg total) by mouth daily. Smitty Cords, DO  Active Other  HYDROmorphone (DILAUDID) 4 MG tablet [id number]  Take 1 tablet (4 mg total) by mouth every 4 (four) hours as needed for severe pain (pain score 7-10). Borders, Daryl Eastern, NP  Active   ipratropium-albuterol (DUONEB) 0.5-2.5 (3) MG/3ML SOLN [id number]  Take 3 mLs by nebulization 4 (four) times daily. Sunnie Nielsen, DO  Active  Other  lenalidomide (REVLIMID) 20 MG capsule [id number] No Take 1 capsule (20 mg total) by mouth daily. Take 1 capsule every day for 14 day and then hold for 1 week.  [employer]- [id number]          obtained:2/14//2025  Patient not taking: Reported on 08/26/2023   Creig Hines, MD Not Taking Active   levothyroxine (SYNTHROID) 125 MCG tablet [id number]  Take 2 tablets (250 mcg total) by mouth  daily before breakfast. Loyce Dys, MD  Active   lidocaine-prilocaine (EMLA) cream [id number]  Apply 1 Application topically once. [provider]  Active   ondansetron (ZOFRAN) 8 MG tablet [id number]  Take 1 tablet (8 mg total) by mouth every 8 (eight) hours as needed for nausea or vomiting. Creig Hines, MD  Active   pantoprazole (PROTONIX) 40 MG tablet [id number]  30 min before lunch or dinner McLean-Scocuzza, Pasty Spillers, MD  Active Other  polyethylene glycol (MIRALAX / GLYCOLAX) 17 g packet [id number]  Take 17 g by mouth 2 (two) times daily.  Patient not taking: Reported on 05/26/2023   Enedina Finner, MD  Active Other  prochlorperazine (COMPAZINE) 10 MG tablet [id number]  Take 1 tablet (10 mg total) by mouth every 6 (six) hours as needed for nausea or vomiting. Creig Hines, MD  Active   senna-docusate (SENOKOT-S) 8.6-50 MG tablet [id number]  Take 1 tablet by mouth 2 (two) times daily. Loyce Dys, MD  Active   Spacer/Aero-Holding Chambers (AEROCHAMBER MV) inhaler [id number]  Use as instructed Raechel Chute, MD  Active Other  SYRINGE-NEEDLE, DISP, 3 ML 25G X 1-1/2" 3 ML MISC [id number]  1 Device by Does not apply route every 30 (thirty) days. With B12 shot McLean-Scocuzza, Pasty Spillers, MD  Active Other  thiamine (VITAMIN B-1) 100 MG tablet [id number]  Take 1 tablet (100 mg total) by mouth daily. Loyce Dys, MD  Active   zolpidem (AMBIEN) 10 MG tablet [id number] No TAKE 1/2 TO 1 TABLET(5 TO 10 MG) BY MOUTH AT BEDTIME AS NEEDED FOR SLEEP  Patient not taking: Reported on 08/26/2023   Smitty Cords, DO Not Taking Active   Med List Note Remi Haggard, RPH-CPP 03/25/23 1047): Revlimid [facility] at [facility]            Home Care and Equipment/Supplies: Were Home Health Services Ordered?: Yes Name of Home Health Agency:: [facility] Has Agency set up a time to come to your home?: No EMR reviewed for Home Health Orders: Orders present/patient has not received call (refer  to CM for follow-up) (The patient states she is at her brother's home and does not want home health right now) Any new equipment or medical supplies ordered?: No  Functional Questionnaire: Do you need assistance with bathing/showering or dressing?: No Do you need assistance with meal preparation?: Yes Do you need assistance with eating?: No Do you have difficulty maintaining continence: No Do you need assistance with getting out of bed/getting out of a chair/moving?: No Do you have difficulty managing or taking your medications?: No  Follow up appointments reviewed: PCP Follow-up appointment confirmed?: Yes Date of PCP follow-up appointment?: 09/01/23 Follow-up Provider: Dr. Althea Charon Specialist [facility] Follow-up appointment confirmed?: Yes Date of Specialist follow-up appointment?: 08/29/23 Follow-Up Specialty Provider:: [facility] Oncology department Do you need transportation to your follow-up appointment?: No Do you understand care options if your condition(s) worsen?: Yes-patient verbalized understanding  SDOH Interventions Today    Flowsheet Row Most Recent Value  SDOH Interventions  Food Insecurity Interventions Intervention Not Indicated  Housing Interventions Intervention Not Indicated  Transportation Interventions Intervention Not Indicated  Utilities Interventions Intervention Not Indicated      Interventions Today    Flowsheet Row Most Recent Value  Chronic Disease   Chronic disease during today's visit Other  [Multiple Myeloma]  General Interventions   General Interventions Discussed/Reviewed General Interventions Discussed, General Interventions Reviewed, Doctor Visits  Doctor Visits Discussed/Reviewed Doctor Visits Reviewed, Doctor Visits Discussed  Exercise Interventions   Exercise Discussed/Reviewed Physical Activity  Physical Activity Discussed/Reviewed Physical Activity Discussed  Education Interventions   Education Provided Provided Education  Provided  Verbal Education On Insurance Plans, Medication, When to see the doctor  Mental Health Interventions   Mental Health Discussed/Reviewed Mental Health Reviewed, Anxiety  Nutrition Interventions   Nutrition Discussed/Reviewed Nutrition Discussed  [Immunotherapy diet]  Pharmacy Interventions   Pharmacy Dicussed/Reviewed Medications and their functions        Goals      TOC Outreach Program     Current Barriers:  Chronic Disease Management support and education needs related to Multiple Myeloma   RNCM Clinical Goal(s):  Patient will work with the Care Management team over the next 30 days to address Transition of Care Barriers: Medication access Medication Management Diet/Nutrition/Food Resources Support at home Provider appointments Home Health services verbalize understanding of plan for management of Multiple Myeloma as evidenced by EMR, weekly support  through collaboration with RN Care manager, provider, and care team.   Interventions: Evaluation of current treatment plan related to  self management and patient's adherence to plan as established by provider   Oncology:  (Status: New goal.) Short Term Goal Assessment of understanding of oncology diagnosis:  Assessed patient understanding of cancer diagnosis and recommended treatment plan Reviewed upcoming provider appointments and treatment appointments Assessed support system. Has consistent/reliable family or other support: Yes Assess Pain level - the patient has Hydromorphone as needed Assess Anxiety and Depression weekly   Patient Goals/Self-Care Activities: Participate in Transition of Care Program/Attend TOC scheduled calls Notify RN Care Manager of TOC call rescheduling needs Take all medications as prescribed Attend all scheduled provider appointments Call pharmacy for medication refills 3-7 days in advance of running out of medications Perform all self care activities independently   Follow Up Plan:  Telephone  follow up appointment with care management team member scheduled for:  Tuesday March 18th at 11:30am        The patient had a recent Stem Cell transplant for Multiple Myeloma. She is staying with her brother until she can get her strength back. She has not heard from the [facility] which she states she doesn't want to do while she is at her brother's. She states her pain is well controlled and she has many appointments coming up with [facility].  Routine follow-up and on-going assessment evaluation and education of disease processes, and recommended interventions for both chronic and acute medical conditions, will occur during each weekly visit during [facility] 30-day Program Outreach calls along with ongoing review of symptoms, medication reviews and reconciliation. Any updates, inconsistencies, discrepancies or acute care concerns will be addressed on the Care Plan and routed to the correct Practitioner if indicated.     Deidre Ala, BSN, RN [place]  VBCI - [employer] [facility] RN Care Manager [phone]

## 2023-08-29 ENCOUNTER — Telehealth: Payer: Self-pay

## 2023-08-29 NOTE — Telephone Encounter (Signed)
 Copied from CRM [id number]. Topic: Clinical - Request for Lab/Test Order >> Aug 29, 2023 10:49 AM Izetta Dakin wrote: Reason for CRM: Patient requesting lab work.  Callback # [phone]

## 2023-09-01 ENCOUNTER — Ambulatory Visit: Admitting: Family Medicine

## 2023-09-01 ENCOUNTER — Other Ambulatory Visit: Payer: Self-pay

## 2023-09-01 ENCOUNTER — Other Ambulatory Visit: Payer: Self-pay | Admitting: Hospice and Palliative Medicine

## 2023-09-01 ENCOUNTER — Encounter: Payer: Self-pay | Admitting: Family Medicine

## 2023-09-01 VITALS — BP 120/78 | HR 91 | Ht 62.0 in | Wt 160.0 lb

## 2023-09-01 DIAGNOSIS — E612 Magnesium deficiency: Secondary | ICD-10-CM

## 2023-09-01 DIAGNOSIS — D696 Thrombocytopenia, unspecified: Secondary | ICD-10-CM | POA: Diagnosis not present

## 2023-09-01 DIAGNOSIS — C9000 Multiple myeloma not having achieved remission: Secondary | ICD-10-CM | POA: Diagnosis not present

## 2023-09-01 DIAGNOSIS — F332 Major depressive disorder, recurrent severe without psychotic features: Secondary | ICD-10-CM

## 2023-09-01 MED ORDER — HYDROMORPHONE HCL 4 MG PO TABS: 4.0000 mg | ORAL_TABLET | ORAL | 0 refills | Status: DC | PRN

## 2023-09-01 NOTE — Patient Instructions (Addendum)
 Thank you for coming to the office today.    Please schedule a Follow-up Appointment to: Return if symptoms worsen or fail to improve.  If you have any other questions or concerns, please feel free to call the office or send a message through MyChart. You may also schedule an earlier appointment if necessary.  Additionally, you may be receiving a survey about your experience at our office within a few days to 1 week by e-mail or mail. We value your feedback.  Saralyn Pilar, DO [facility] [place]

## 2023-09-01 NOTE — Progress Notes (Unsigned)
 Subjective:    Patient ID: Paula Garrett, female    DOB: 1966/01/25, 58 y.o.   MRN: [id number]  Paula Garrett is a 58 y.o. female presenting on 09/01/2023 for No chief complaint on file.   HPI  Discussed the use of AI scribe software for clinical note transcription with the patient, who gave verbal consent to proceed.  History of Present Illness   Paula Garrett is a 59 year old female who presents for follow-up after a recent bone marrow transplant for multiple myeloma. Followed by Dr Hewitt Shorts [facility]  She underwent a bone marrow transplant on August 06, 2023, and was discharged from the hospital on August 22, 2023. She has been following up with her oncologist and is scheduled to see another specialist in April. She is here today for a comprehensive panel as her platelets are not at optimal levels.  She experiences significant pain, particularly in her legs, which she attributes to the shots given to boost her platelet or cell count prior to the transplant. She has been on Dilaudid for pain management, initially prescribed for spinal pain due to screws placed in her spine. Her current regimen isr 4 mg every six hours. Her pain has been exacerbated by the shots, causing 'terrible throbbing legs and aches.'  After her hospital discharge, she felt weak and unwell, with symptoms worsening on August 30, 2023, including diarrhea, dizziness, and severe leg pain. She has a history of C. diff infection, which was treated with a specialized antibiotic for ten days, and she was not sent home with further antibiotics. She is currently managing diarrhea with Imodium.  Her current medications include Dilaudid for pain, and she has been taking vitamins to address low magnesium and calcium levels. She is no longer on blood pressure medications or steroids. She has clonazepam but reports it is ineffective for her symptoms.          09/01/2023   10:38 AM 09/01/2023   10:37 AM 05/05/2023    3:20  PM  Depression screen PHQ 2/9  Decreased Interest 1 1 1   Down, Depressed, Hopeless 1 1 1   PHQ - 2 Score 2 2 2   Altered sleeping 2 2 1   Tired, decreased energy 2 2 1   Change in appetite 1 1 1   Feeling bad or failure about yourself  1 1 1   Trouble concentrating 1 1 1   Moving slowly or fidgety/restless 0 0 0  Suicidal thoughts 0 0 0  PHQ-9 Score 9 9 7   Difficult doing work/chores Somewhat difficult Somewhat difficult Somewhat difficult       09/01/2023   10:38 AM 05/05/2023    3:20 PM 01/01/2023    8:50 AM 11/20/2022   11:23 AM  GAD 7 : Generalized Anxiety Score  Nervous, Anxious, on Edge 1 1 1 3   Control/stop worrying 1 1 1 3   Worry too much - different things 1 1 1 3   Trouble relaxing 1 1 1 3   Restless 0 1 0 1  Easily annoyed or irritable 2 1 1 1   Afraid - awful might happen 0 3 2 1   Total GAD 7 Score 6 9 7 15   Anxiety Difficulty Somewhat difficult Somewhat difficult  Somewhat difficult    Social History   Tobacco Use   Smoking status: Former    Types: Cigarettes   Smokeless tobacco: Never   Tobacco comments:    Quit smoking 02/16/2023  Vaping Use   Vaping status: Never Used  Substance Use Topics   Alcohol use: Yes    Alcohol/week: 10.0 - 12.0 standard drinks of alcohol    Types: 8 - 10 Glasses of wine, 2 Standard drinks or equivalent per week   Drug use: No    Review of Systems Per HPI unless specifically indicated above     Objective:    BP 120/78   Pulse 91   Ht 5\' 2"  (1.575 m)   Wt 160 lb (72.6 kg)   SpO2 96%   BMI 29.26 kg/m   Wt Readings from Last 3 Encounters:  09/01/23 160 lb (72.6 kg)  06/30/23 174 lb (78.9 kg)  06/26/23 173 lb 8 oz (78.7 kg)    Physical Exam Vitals and nursing note reviewed.  Constitutional:      General: She is not in acute distress.    Appearance: Normal appearance. She is well-developed. She is not diaphoretic.     Comments: Well-appearing, comfortable, cooperative  HENT:     Head: Normocephalic and atraumatic.   Eyes:     General:        Right eye: No discharge.        Left eye: No discharge.     Conjunctiva/sclera: Conjunctivae normal.  Cardiovascular:     Rate and Rhythm: Normal rate.  Pulmonary:     Effort: Pulmonary effort is normal.  Skin:    General: Skin is warm and dry.     Findings: No erythema or rash.  Neurological:     Mental Status: She is alert and oriented to person, place, and time.  Psychiatric:        Behavior: Behavior normal.        Thought Content: Thought content normal.     Comments: Well groomed, good eye contact, normal speech and thoughts. Depressed mood, tearful     Results for orders placed or performed in visit on 09/01/23  CBC with Differential/Platelet   Collection Time: 09/01/23 11:33 AM  Result Value Ref Range   WBC 4.8 3.8 - 10.8 Thousand/uL   RBC 3.30 (L) 3.80 - 5.10 Million/uL   Hemoglobin 9.0 (L) 11.7 - 15.5 g/dL   HCT 40.9 (L) 81.1 - 91.4 %   MCV 83.9 80.0 - 100.0 fL   MCH 27.3 27.0 - 33.0 pg   MCHC 32.5 32.0 - 36.0 g/dL   RDW 78.2 (H) 95.6 - 21.3 %   Platelets 242 140 - 400 Thousand/uL   MPV 10.7 7.5 - 12.5 fL   Neutro Abs 3,134 1,500 - 7,800 cells/uL   Absolute Lymphocytes 989 850 - 3,900 cells/uL   Absolute Monocytes 648 200 - 950 cells/uL   Eosinophils Absolute 0 (L) 15 - 500 cells/uL   Basophils Absolute 29 0 - 200 cells/uL   Neutrophils Relative % 65.3 %   Total Lymphocyte 20.6 %   Monocytes Relative 13.5 %   Eosinophils Relative 0.0 %   Basophils Relative 0.6 %      Assessment & Plan:   Problem List Items Addressed This Visit     Multiple myeloma (HCC) - Primary   Relevant Medications   sulfamethoxazole-trimethoprim (BACTRIM DS) 800-160 MG tablet   Other Relevant Orders   Magnesium   CBC with Differential/Platelet (Completed)   COMPLETE METABOLIC PANEL WITH GFR   Severe recurrent major depression without psychotic features (HCC)   Other Visit Diagnoses       Magnesium deficiency       Relevant Orders   Magnesium      Thrombocytopenia (  HCC)       Relevant Orders   CBC with Differential/Platelet (Completed)       Multiple Myeloma HFU Post-bone marrow transplant care Post-transplant symptoms include weakness, dizziness, leg pain, and diarrhea. Platelets increasing but require monitoring. - Order comprehensive blood panel including magnesium level, complete metabolic panel, and complete blood count. - Communicate with Dr. Assunta Gambles office regarding pain management and medication refills. Coordinated with symptom management provider Laurette Schimke NP and he was able to refill Dilaudid 4mg , as this is for cancer pain  Diarrhea Diarrhea managed with over-the-counter medications. C. diff infection previously treated. - Monitor diarrhea symptoms and manage with over-the-counter medications as needed.  Major Depression recurrent On therapy currently SSRI      Orders Placed This Encounter  Procedures   Magnesium   CBC with Differential/Platelet   COMPLETE METABOLIC PANEL WITH GFR    No orders of the defined types were placed in this encounter.   Follow up plan: Return if symptoms worsen or fail to improve.  Saralyn Pilar, DO [facility] 09/01/2023, 11:19 AM

## 2023-09-02 ENCOUNTER — Other Ambulatory Visit: Payer: Self-pay

## 2023-09-02 ENCOUNTER — Encounter: Payer: Self-pay | Admitting: Family Medicine

## 2023-09-02 ENCOUNTER — Encounter: Payer: Self-pay | Admitting: Oncology

## 2023-09-02 LAB — COMPLETE METABOLIC PANEL WITHOUT GFR
AG Ratio: 1.8 (calc) (ref 1.0–2.5)
ALT: 12 U/L (ref 6–29)
AST: 13 U/L (ref 10–35)
Albumin: 4 g/dL (ref 3.6–5.1)
Alkaline phosphatase (APISO): 79 U/L (ref 37–153)
BUN: 12 mg/dL (ref 7–25)
CO2: 25 mmol/L (ref 20–32)
Calcium: 8.6 mg/dL (ref 8.6–10.4)
Chloride: 104 mmol/L (ref 98–110)
Creat: 0.93 mg/dL (ref 0.50–1.03)
Globulin: 2.2 g/dL (ref 1.9–3.7)
Glucose, Bld: 113 mg/dL — ABNORMAL HIGH (ref 65–99)
Potassium: 4.7 mmol/L (ref 3.5–5.3)
Sodium: 137 mmol/L (ref 135–146)
Total Bilirubin: 0.4 mg/dL (ref 0.2–1.2)
Total Protein: 6.2 g/dL (ref 6.1–8.1)
eGFR: 71 mL/min/1.73m2

## 2023-09-02 LAB — CBC WITH DIFFERENTIAL/PLATELET
Absolute Lymphocytes: 989 {cells}/uL (ref 850–3900)
Absolute Monocytes: 648 {cells}/uL (ref 200–950)
Basophils Absolute: 29 {cells}/uL (ref 0–200)
Basophils Relative: 0.6 %
Eosinophils Absolute: 0 {cells}/uL — ABNORMAL LOW (ref 15–500)
Eosinophils Relative: 0 %
HCT: 27.7 % — ABNORMAL LOW (ref 35.0–45.0)
Hemoglobin: 9 g/dL — ABNORMAL LOW (ref 11.7–15.5)
MCH: 27.3 pg (ref 27.0–33.0)
MCHC: 32.5 g/dL (ref 32.0–36.0)
MCV: 83.9 fL (ref 80.0–100.0)
MPV: 10.7 fL (ref 7.5–12.5)
Monocytes Relative: 13.5 %
Neutro Abs: 3134 {cells}/uL (ref 1500–7800)
Neutrophils Relative %: 65.3 %
Platelets: 242 Thousand/uL (ref 140–400)
RBC: 3.3 Million/uL — ABNORMAL LOW (ref 3.80–5.10)
RDW: 18 % — ABNORMAL HIGH (ref 11.0–15.0)
Total Lymphocyte: 20.6 %
WBC: 4.8 Thousand/uL (ref 3.8–10.8)

## 2023-09-02 LAB — MAGNESIUM: Magnesium: 1.6 mg/dL (ref 1.5–2.5)

## 2023-09-02 NOTE — Patient Outreach (Signed)
 Care Management  Transitions of Care Program Transitions of Care Post-discharge week 2  09/02/2023 Name: Paula Garrett MRN: [id number] DOB: Mar 09, 1966  Subjective: Paula Garrett is a 58 y.o. year old female who is a primary care patient of Smitty Cords, DO. The Care Management team was unable to reach the patient by phone to assess and address transitions of care needs.   Plan: Additional outreach attempts will be made to reach the patient enrolled in the [facility] Program (Post Inpatient/ED Visit).  Deidre Ala, BSN, RN [place]  VBCI - [employer] [facility] RN Care Manager [phone]

## 2023-09-03 ENCOUNTER — Telehealth: Payer: Self-pay

## 2023-09-03 NOTE — Patient Outreach (Signed)
 Care Management  Transitions of Care Program Transitions of Care Post-discharge week 2  09/03/2023 Name: Paula Garrett MRN: [id number] DOB: 03/23/1966  Subjective: Paula Garrett is a 58 y.o. year old female who is a primary care patient of Smitty Cords, DO. The Care Management team was unable to reach the patient by phone to assess and address transitions of care needs.   Plan: Additional outreach attempts will be made to reach the patient enrolled in the [facility] Program (Post Inpatient/ED Visit). Deidre Ala, BSN, RN [place]  VBCI - [employer] [facility] RN Care Manager [phone]

## 2023-09-12 ENCOUNTER — Other Ambulatory Visit: Payer: Self-pay | Admitting: Hospice and Palliative Medicine

## 2023-09-12 MED ORDER — HYDROMORPHONE HCL 4 MG PO TABS: 4.0000 mg | ORAL_TABLET | ORAL | 0 refills | Status: DC | PRN

## 2023-09-17 ENCOUNTER — Inpatient Hospital Stay (HOSPITAL_BASED_OUTPATIENT_CLINIC_OR_DEPARTMENT_OTHER): Admitting: Oncology

## 2023-09-17 ENCOUNTER — Encounter: Payer: Self-pay | Admitting: Oncology

## 2023-09-17 ENCOUNTER — Inpatient Hospital Stay: Attending: Oncology

## 2023-09-17 VITALS — BP 108/76 | HR 101 | Temp 98.8°F | Wt 157.9 lb

## 2023-09-17 DIAGNOSIS — C9001 Multiple myeloma in remission: Secondary | ICD-10-CM | POA: Diagnosis not present

## 2023-09-17 DIAGNOSIS — Z87891 Personal history of nicotine dependence: Secondary | ICD-10-CM | POA: Insufficient documentation

## 2023-09-17 DIAGNOSIS — Z7982 Long term (current) use of aspirin: Secondary | ICD-10-CM | POA: Insufficient documentation

## 2023-09-17 DIAGNOSIS — Z79891 Long term (current) use of opiate analgesic: Secondary | ICD-10-CM | POA: Insufficient documentation

## 2023-09-17 DIAGNOSIS — Z800 Family history of malignant neoplasm of digestive organs: Secondary | ICD-10-CM | POA: Insufficient documentation

## 2023-09-17 DIAGNOSIS — C9000 Multiple myeloma not having achieved remission: Secondary | ICD-10-CM | POA: Diagnosis present

## 2023-09-17 DIAGNOSIS — Z7984 Long term (current) use of oral hypoglycemic drugs: Secondary | ICD-10-CM | POA: Diagnosis not present

## 2023-09-17 DIAGNOSIS — R5383 Other fatigue: Secondary | ICD-10-CM | POA: Diagnosis not present

## 2023-09-17 DIAGNOSIS — G893 Neoplasm related pain (acute) (chronic): Secondary | ICD-10-CM | POA: Diagnosis not present

## 2023-09-17 DIAGNOSIS — Z9484 Stem cells transplant status: Secondary | ICD-10-CM | POA: Insufficient documentation

## 2023-09-17 DIAGNOSIS — Z8719 Personal history of other diseases of the digestive system: Secondary | ICD-10-CM | POA: Diagnosis not present

## 2023-09-17 DIAGNOSIS — Z9884 Bariatric surgery status: Secondary | ICD-10-CM | POA: Insufficient documentation

## 2023-09-17 DIAGNOSIS — Z7189 Other specified counseling: Secondary | ICD-10-CM | POA: Diagnosis not present

## 2023-09-17 DIAGNOSIS — Z923 Personal history of irradiation: Secondary | ICD-10-CM | POA: Insufficient documentation

## 2023-09-17 LAB — CBC WITH DIFFERENTIAL (CANCER CENTER ONLY)
Abs Immature Granulocytes: 0.02 10*3/uL (ref 0.00–0.07)
Basophils Absolute: 0 10*3/uL (ref 0.0–0.1)
Basophils Relative: 1 %
Eosinophils Absolute: 0.2 10*3/uL (ref 0.0–0.5)
Eosinophils Relative: 4 %
HCT: 27.7 % — ABNORMAL LOW (ref 36.0–46.0)
Hemoglobin: 9 g/dL — ABNORMAL LOW (ref 12.0–15.0)
Immature Granulocytes: 0 %
Lymphocytes Relative: 36 %
Lymphs Abs: 1.6 10*3/uL (ref 0.7–4.0)
MCH: 28.9 pg (ref 26.0–34.0)
MCHC: 32.5 g/dL (ref 30.0–36.0)
MCV: 89.1 fL (ref 80.0–100.0)
Monocytes Absolute: 0.6 10*3/uL (ref 0.1–1.0)
Monocytes Relative: 13 %
Neutro Abs: 2.1 10*3/uL (ref 1.7–7.7)
Neutrophils Relative %: 46 %
Platelet Count: 213 10*3/uL (ref 150–400)
RBC: 3.11 MIL/uL — ABNORMAL LOW (ref 3.87–5.11)
RDW: 19.7 % — ABNORMAL HIGH (ref 11.5–15.5)
WBC Count: 4.5 10*3/uL (ref 4.0–10.5)
nRBC: 0 % (ref 0.0–0.2)

## 2023-09-17 LAB — CMP (CANCER CENTER ONLY)
ALT: 14 U/L (ref 0–44)
AST: 23 U/L (ref 15–41)
Albumin: 3.4 g/dL — ABNORMAL LOW (ref 3.5–5.0)
Alkaline Phosphatase: 66 U/L (ref 38–126)
Anion gap: 9 (ref 5–15)
BUN: 11 mg/dL (ref 6–20)
CO2: 22 mmol/L (ref 22–32)
Calcium: 8.6 mg/dL — ABNORMAL LOW (ref 8.9–10.3)
Chloride: 102 mmol/L (ref 98–111)
Creatinine: 1.25 mg/dL — ABNORMAL HIGH (ref 0.44–1.00)
GFR, Estimated: 49 mL/min — ABNORMAL LOW (ref >60)
Glucose, Bld: 100 mg/dL — ABNORMAL HIGH (ref 70–99)
Potassium: 4.4 mmol/L (ref 3.5–5.1)
Sodium: 133 mmol/L — ABNORMAL LOW (ref 135–145)
Total Bilirubin: 0.4 mg/dL (ref 0.0–1.2)
Total Protein: 5.9 g/dL — ABNORMAL LOW (ref 6.5–8.1)

## 2023-09-17 NOTE — Progress Notes (Signed)
 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   She presented to the ER with symptoms of pleuritic chest wall pain and shortness of breath and underwent a CT angio chest which showed a soft tissue mass with bone destruction involving the left seventh rib as well as T6 and T7 vertebral bodies and central canal.  Possibility of cord compression.  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   Results of myeloma work-up from 06/20/2020 showed an elevated IgG of 3668.  M protein was elevated at 3.1 and IFE showed IgG monoclonal lambda protein.  Beta-2 microglobulin and LDH were normal.  Serum free light chain ratio was elevated at 25 with a free light chain lambda of 303.  Bone marrow biopsy showed increased plasma cells 22% by manual count and 20% by IHC staining for CD138.  Cytogenetics for myeloma showed gain of 1 q.   MRI cervical and lumbar thoracic  spine did not show any evidence of lytic lesions.  Bone survey was negative for bone lesions as well.  MRI pelvis with and without contrast showed diffuse patchy heterogeneous marrow concerning for myeloma    Patient had a repeat bone marrow biopsy inAugust 2024 which showed 20% overall plasma cells and hypercellular marrow consistent with plasma cell myeloma.  Cytogenetics normal and FISH studies did not show any abnormalities.  Patient had stabilization procedure/spinal fusion T4-T9 with T6-T7 laminotomies.  Spinal mass biopsy was consistent with plasma cell neoplasm. PET CT scan showed solitary hypermetabolic lytic lesion in the L4 vertebral body consistent with multiple myeloma.  Residual soft tissue density at the left paraspinal resection site T6-T7 with no significant metabolic activity.   Plan was to do outpatient daratumumab Revlimid Velcade dexamethasone regimen.  However patient was found to be in acute kidney failure with a creatinine of 5 in August 2024 and was sent to the ER.  She received cycle 1 of CyBorD in the hospital with improvement in her creatinine with stabilized between 1.5-1.7.  Treatment has been complicated by repeated hospitalizations for acute hypoxic respiratory failure and left pleural effusion which has been treated with antibiotics and thoracentesis.  Patient only received 1 dose of denosumab in November 2024 and subsequently had to be held due to significant hypocalcemia.   Subsequently patient's clinical condition improved and she started D VRD regimen as per Valentina Lucks protocol on 03/31/2023.  She received 4 cycles of D VRD chemotherapy and underwent autoLog a stem cell transplantation day 0 on 08/08/2023 at [facility].  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  Interval history-patient is doing better overall since her transplant.  She still reports ongoing fatigue.  On and off left knee pain as well as back pain for which she is on  as needed Dilaudid.  ECOG PS- 1 Pain scale-5 Opioid associated constipation- no  Review of systems- Review of Systems  Constitutional:  Positive for malaise/fatigue.  Musculoskeletal:  Positive for back pain.       Left knee pain      Allergies  Allergen Reactions   Hctz [Hydrochlorothiazide]     Hyponatremia      Past Medical History:  Diagnosis Date   Anxiety    Arthritis    Asthma    Barrett's esophagus 2015   COVID-19    03/10/20   Depression    GERD (gastroesophageal reflux disease)    Hypertension    Hypothyroidism    Pneumonia    Smoldering myeloma    Thyroid disease      Past Surgical History:  Procedure Laterality Date   ABLATION  2006   APPLICATION OF INTRAOPERATIVE CT SCAN N/A 01/28/2023   Procedure: APPLICATION OF INTRAOPERATIVE CT SCAN;  Surgeon: Loreen Freud, MD;  Location: [facility] ORS;  Service: Neurosurgery;  Laterality: N/A;   BREAST CYST ASPIRATION Left 1998   BREAST SURGERY     cyst aspiration   BREAST SURGERY  1998   cyst aspiration   COLONOSCOPY  03/2014   COLONOSCOPY WITH PROPOFOL N/A 01/06/2020   Procedure: COLONOSCOPY WITH PROPOFOL;  Surgeon: Toledo, Boykin Nearing, MD;  Location: [facility] ENDOSCOPY;  Service: Gastroenterology;  Laterality: N/A;   ENDOMETRIAL ABLATION     ESOPHAGOGASTRODUODENOSCOPY (EGD) WITH PROPOFOL N/A 08/15/2015   Procedure: ESOPHAGOGASTRODUODENOSCOPY (EGD) WITH PROPOFOL;  Surgeon: Christena Deem, MD;  Location: [facility] ENDOSCOPY;  Service: Endoscopy;  Laterality: N/A;   ESOPHAGOGASTRODUODENOSCOPY (EGD) WITH PROPOFOL N/A 01/22/2016   Procedure: ESOPHAGOGASTRODUODENOSCOPY (EGD) WITH PROPOFOL;  Surgeon: Christena Deem, MD;  Location: [facility] ENDOSCOPY;  Service: Endoscopy;  Laterality: N/A;   ESOPHAGOGASTRODUODENOSCOPY (EGD) WITH PROPOFOL N/A 01/06/2020   Procedure: ESOPHAGOGASTRODUODENOSCOPY (EGD) WITH PROPOFOL;  Surgeon: Toledo, Boykin Nearing, MD;  Location: [facility] ENDOSCOPY;  Service: Gastroenterology;  Laterality: N/A;   FLEXIBLE  BRONCHOSCOPY Bilateral 03/13/2023   Procedure: FLEXIBLE BRONCHOSCOPY;  Surgeon: Erin Fulling, MD;  Location: [facility] ORS;  Service: Cardiopulmonary;  Laterality: Bilateral;   GASTRIC BYPASS  2005   HERNIA REPAIR     IR BONE MARROW BIOPSY & ASPIRATION  02/05/2023   NASAL SINUS SURGERY     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   TOTAL THYROIDECTOMY     TUBAL LIGATION      Social History   Socioeconomic History   Marital status: Divorced    Spouse name: Not on file   Number of children: 2   Years of education: Not on file   Highest education level: GED or equivalent  Occupational History   Occupation: Part-time  Tobacco Use   Smoking status: Former    Types: Cigarettes   Smokeless tobacco: Never   Tobacco comments:    Quit smoking 02/16/2023  Vaping Use   Vaping status: Never Used  Substance and Sexual Activity   Alcohol use: Yes    Alcohol/week: 10.0 - 12.0 standard drinks of alcohol    Types: 8 - 10 Glasses of wine, 2 Standard drinks or equivalent per week   Drug use: No   Sexual activity: Yes    Partners: Male  Other Topics Concern   Not on file  Social History Narrative   Lives in [place] single, divorced       Work - 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       Diet - healthy diet   Exercise - limited   Caffeine use: daily   Social Drivers of Corporate investment banker Strain: Low Risk  (11/01/2022)   Overall Financial Resource Strain (CARDIA)    Difficulty of Paying Living Expenses: Not very hard  Food Insecurity: No Food Insecurity (08/26/2023)   Hunger Vital Sign    Worried About Running Out of Food in the Last Year: Never true    Ran Out of Food in the Last Year: Never true  Transportation Needs: No Transportation Needs (08/26/2023)   PRAPARE - Administrator, Civil Service (Medical): No    Lack of Transportation (Non-Medical): No  Physical Activity: Insufficiently Active (11/01/2022)   Exercise Vital Sign    Days of Exercise per Week: 3 days     Minutes of Exercise per Session: 20 min  Stress: Stress Concern Present (03/04/2023)   [employer] of Occupational Health - Occupational Stress Questionnaire    Feeling of Stress : Very much  Social Connections: Socially Isolated (03/04/2023)   Social Connection and Isolation Panel [NHANES]    Frequency of Communication with Friends and Family: Never    Frequency of Social Gatherings with Friends and Family: Never    Attends Religious Services: Never    Database administrator or Organizations: No    Attends Banker Meetings: Never    Marital Status: Divorced  Catering manager Violence: Not At Risk (08/26/2023)   Humiliation, Afraid, Rape, and Kick questionnaire    Fear of Current or Ex-Partner: No    Emotionally Abused: No    Physically Abused: No    Sexually Abused: No    Family History  Problem Relation Age of Onset   Heart disease Mother    COPD Mother    Arthritis Mother    Cancer Mother        uterine   Lupus Mother    Anxiety disorder Mother    Depression Mother    Drug abuse Mother    COPD Father    Arthritis Father    Heart disease Father    Heart attack Father    Alcohol abuse Father    Heart disease Brother    Heart attack Brother    Drug abuse Brother    Anxiety disorder Brother    Depression Brother    Heart disease Brother    Cancer Brother        liver cancer age 58 y.o   Diabetes Maternal Grandmother    Diabetes Maternal Grandfather    Diabetes Paternal Grandmother    Diabetes Paternal Grandfather    Breast cancer Neg Hx      Current Outpatient Medications:    acyclovir (ZOVIRAX) 400 MG tablet, Take 1 tablet (400 mg total) by mouth 2 (two) times daily., Disp: 60 tablet, Rfl: 5   calcium carbonate (OSCAL) 1500 (600 Ca) MG TABS tablet, Take 1,500 mg by mouth 2 (two) times daily with a meal., Disp: , Rfl:    Cholecalciferol (VITAMIN D3) 50 MCG (2000 UT) CAPS, Take by mouth., Disp: , Rfl:    clonazePAM (KLONOPIN) 1 MG tablet, TAKE  1 TABLET(1 MG) BY MOUTH TWICE DAILY, Disp: 60 tablet, Rfl: 2   escitalopram (LEXAPRO) 10 MG tablet, Take  1 tablet (10 mg total) by mouth daily., Disp: 30 tablet, Rfl: 2   HYDROmorphone (DILAUDID) 4 MG tablet, Take 1 tablet (4 mg total) by mouth every 4 (four) hours as needed for severe pain (pain score 7-10)., Disp: 60 tablet, Rfl: 0   ipratropium-albuterol (DUONEB) 0.5-2.5 (3) MG/3ML SOLN, Take 3 mLs by nebulization 4 (four) times daily., Disp: 360 mL, Rfl: 0   levothyroxine (SYNTHROID) 125 MCG tablet, Take 2 tablets (250 mcg total) by mouth daily before breakfast., Disp: 60 tablet, Rfl: 0   magnesium (MAGTAB) 84 MG ( ) TBCR SR tablet, Take 84 mg by mouth., Disp: , Rfl:    pantoprazole (PROTONIX) 40 MG tablet, 30 min before lunch or dinner, Disp: 90 tablet, Rfl: 3   prochlorperazine (COMPAZINE) 10 MG tablet, Take 1 tablet (10 mg total) by mouth every 6 (six) hours as needed for nausea or vomiting., Disp: 30 tablet, Rfl: 1   Spacer/Aero-Holding Chambers (AEROCHAMBER MV) inhaler, Use as instructed, Disp: 1 each, Rfl: 0   zolpidem (AMBIEN) 10 MG tablet, TAKE 1/2 TO 1 TABLET(5 TO 10 MG) BY MOUTH AT BEDTIME AS NEEDED FOR SLEEP, Disp: 30 tablet, Rfl: 2   aspirin EC 81 MG tablet, Take 1 tablet (81 mg total) by mouth daily. (Patient not taking: Reported on 09/17/2023), Disp: 100 tablet, Rfl: 3   butalbital-acetaminophen-caffeine (FIORICET) 50-325-40 MG tablet, TAKE 1 TABLET BY MOUTH EVERY 4 HOURS AS NEEDED FOR HEADACHE (Patient not taking: Reported on 09/17/2023), Disp: 14 tablet, Rfl: 3   cyanocobalamin (VITAMIN B12) 1000 MCG/ML injection, INJECT 1 ML IN THE MUSCLE EVERY 30 DAYS (Patient not taking: No sig reported), Disp: 4 mL, Rfl: 12   docusate sodium (COLACE) 100 MG capsule, Take 1 capsule (100 mg total) by mouth 2 (two) times daily. (Patient not taking: Reported on 05/26/2023), Disp: 10 capsule, Rfl: 0   lenalidomide (REVLIMID) 20 MG capsule, Take 1 capsule (20 mg total) by mouth daily. Take 1 capsule  every day for 14 day and then hold for 1 week.  [employer]- [id number]          obtained:2/14//2025 (Patient not taking: Reported on 08/26/2023), Disp: 14 capsule, Rfl: 0   lidocaine-prilocaine (EMLA) cream, Apply 1 Application topically once. (Patient not taking: Reported on 09/17/2023), Disp: , Rfl:    ondansetron (ZOFRAN) 8 MG tablet, Take 1 tablet (8 mg total) by mouth every 8 (eight) hours as needed for nausea or vomiting. (Patient not taking: Reported on 09/17/2023), Disp: 30 tablet, Rfl: 1   polyethylene glycol (MIRALAX / GLYCOLAX) 17 g packet, Take 17 g by mouth 2 (two) times daily. (Patient not taking: Reported on 05/26/2023), Disp: 14 each, Rfl: 0   senna-docusate (SENOKOT-S) 8.6-50 MG tablet, Take 1 tablet by mouth 2 (two) times daily. (Patient not taking: Reported on 09/17/2023), Disp: 30 tablet, Rfl: 0   sulfamethoxazole-trimethoprim (BACTRIM DS) 800-160 MG tablet, Take 1 tablet by mouth 2 (two) times daily. (Patient not taking: Reported on 09/17/2023), Disp: , Rfl:    SYRINGE-NEEDLE, DISP, 3 ML 25G X 1-1/2" 3 ML MISC, 1 Device by Does not apply route every 30 (thirty) days. With B12 shot (Patient not taking: Reported on 09/17/2023), Disp: 12 each, Rfl: 1   thiamine (VITAMIN B-1) 100 MG tablet, Take 1 tablet (100 mg total) by mouth daily. (Patient not taking: Reported on 09/17/2023), Disp: 30 tablet, Rfl: 0  Physical exam: There were no vitals filed for this visit. Physical Exam Cardiovascular:     Rate and Rhythm: Normal rate and  regular rhythm.     Heart sounds: Normal heart sounds.  Pulmonary:     Effort: Pulmonary effort is normal.     Breath sounds: Normal breath sounds.  Abdominal:     General: Bowel sounds are normal.     Palpations: Abdomen is soft.  Musculoskeletal:     Right lower leg: No edema.     Left lower leg: No edema.  Skin:    General: Skin is warm and dry.  Neurological:     Mental Status: She is alert and oriented to person, place, and time.      I have personally reviewed  labs listed below:    Latest Ref Rng & Units 09/01/2023   11:33 AM  CMP  Glucose 65 - 99 mg/dL 829   BUN 7 - 25 mg/dL 12   Creatinine 5.62 - 1.03 mg/dL 1.30   Sodium 865 - 784 mmol/L 137   Potassium 3.5 - 5.3 mmol/L 4.7   Chloride 98 - 110 mmol/L 104   CO2 20 - 32 mmol/L 25   Calcium 8.6 - 10.4 mg/dL 8.6   Total Protein 6.1 - 8.1 g/dL 6.2   Total Bilirubin 0.2 - 1.2 mg/dL 0.4   AST 10 - 35 U/L 13   ALT 6 - 29 U/L 12       Latest Ref Rng & Units 09/01/2023   11:33 AM  CBC  WBC 3.8 - 10.8 Thousand/uL 4.8   Hemoglobin 11.7 - 15.5 g/dL 9.0   Hematocrit 69.6 - 45.0 % 27.7   Platelets 140 - 400 Thousand/uL 242     Assessment and plan- Patient is a 58 y.o. female with history of standard risk IgG lambda multiple myeloma R-ISS stage II s/p D RVD chemotherapy as induction regimen followed by autologous stem cell transplantation currently in [facility] here for routine follow-up  She is presently day 40 posttransplant.  Day 0 was 08/08/2023.Labs today including CBC with differential shows normocytic anemia likely posttransplant but white count and platelets are normal.  Serum creatinine is mildly elevated at 1.2 which we will continue to monitor.  Plan is to restart treatment between day 60 today 800 posttransplant.  I will tentatively see her back on 10/29/2023 which would be approximately day 80 posttransplant and we will plan to get started with 1 cycle of consolidation treatment with D VRD followed by maintenance treatment which will comprise of single agent Revlimid alone instead of both Darzalex and Revlimid as maintenance.  I will repeat CBC with differential CMP myeloma panel and serum free light chains made in May 2025 and discuss start of treatment at that time.  Patient will be receiving posttransplant vaccinations at [facility].  Chronic back pain: Continue as needed DilaudidPET scan in January 2025 showed soft tissue mass spanning T8-T9 vertebral body with osseous destruction of T6 and T7  vertebral bodies but without focal radiotracer uptake.  Well-defined lytic lesions in axial and appendicular skeleton which may represent treated sites of myeloma.   Visit Diagnosis 1. Multiple myeloma in remission (HCC)   2. Goals of care, counseling/discussion      Dr. Owens Shark, MD, MPH [facility] [phone] 09/17/2023 9:48 AM

## 2023-09-17 NOTE — Progress Notes (Signed)
 Patient denies new or acute problems/concerns today.

## 2023-09-18 LAB — KAPPA/LAMBDA LIGHT CHAINS
Kappa free light chain: 4.7 mg/L (ref 3.3–19.4)
Kappa, lambda light chain ratio: 1.57 (ref 0.26–1.65)
Lambda free light chains: 3 mg/L — ABNORMAL LOW (ref 5.7–26.3)

## 2023-09-21 LAB — MULTIPLE MYELOMA PANEL, SERUM
Albumin SerPl Elph-Mcnc: 3.2 g/dL (ref 2.9–4.4)
Albumin/Glob SerPl: 1.6 (ref 0.7–1.7)
Alpha 1: 0.3 g/dL (ref 0.0–0.4)
Alpha2 Glob SerPl Elph-Mcnc: 0.7 g/dL (ref 0.4–1.0)
B-Globulin SerPl Elph-Mcnc: 0.8 g/dL (ref 0.7–1.3)
Gamma Glob SerPl Elph-Mcnc: 0.3 g/dL — ABNORMAL LOW (ref 0.4–1.8)
Globulin, Total: 2.1 g/dL — ABNORMAL LOW (ref 2.2–3.9)
IgA: 24 mg/dL — ABNORMAL LOW (ref 87–352)
IgG (Immunoglobin G), Serum: 481 mg/dL — ABNORMAL LOW (ref 586–1602)
IgM (Immunoglobulin M), Srm: 28 mg/dL (ref 26–217)
Total Protein ELP: 5.3 g/dL — ABNORMAL LOW (ref 6.0–8.5)

## 2023-09-24 ENCOUNTER — Other Ambulatory Visit: Payer: Self-pay | Admitting: Oncology

## 2023-09-24 DIAGNOSIS — C9000 Multiple myeloma not having achieved remission: Secondary | ICD-10-CM

## 2023-09-25 ENCOUNTER — Encounter: Payer: Self-pay | Admitting: Hospice and Palliative Medicine

## 2023-09-26 ENCOUNTER — Encounter: Payer: Self-pay | Admitting: Oncology

## 2023-09-26 MED ORDER — HYDROMORPHONE HCL 4 MG PO TABS: 4.0000 mg | ORAL_TABLET | ORAL | 0 refills | Status: DC | PRN

## 2023-10-09 ENCOUNTER — Other Ambulatory Visit: Payer: Self-pay | Admitting: Oncology

## 2023-10-10 ENCOUNTER — Encounter: Payer: Self-pay | Admitting: Oncology

## 2023-10-10 MED ORDER — HYDROMORPHONE HCL 4 MG PO TABS: 4.0000 mg | ORAL_TABLET | ORAL | 0 refills | Status: DC | PRN

## 2023-10-15 DIAGNOSIS — F341 Dysthymic disorder: Secondary | ICD-10-CM | POA: Diagnosis not present

## 2023-10-15 DIAGNOSIS — F17200 Nicotine dependence, unspecified, uncomplicated: Secondary | ICD-10-CM | POA: Diagnosis not present

## 2023-10-15 DIAGNOSIS — F411 Generalized anxiety disorder: Secondary | ICD-10-CM | POA: Diagnosis not present

## 2023-10-15 DIAGNOSIS — C9000 Multiple myeloma not having achieved remission: Secondary | ICD-10-CM | POA: Diagnosis not present

## 2023-10-17 ENCOUNTER — Other Ambulatory Visit: Payer: Self-pay

## 2023-10-17 DIAGNOSIS — M4804 Spinal stenosis, thoracic region: Secondary | ICD-10-CM

## 2023-10-20 ENCOUNTER — Ambulatory Visit
Admission: RE | Admit: 2023-10-20 | Discharge: 2023-10-20 | Disposition: A | Discharge status: Home / Self Care | Source: Ambulatory Visit | Attending: Neurosurgery | Admitting: Neurosurgery

## 2023-10-20 ENCOUNTER — Ambulatory Visit (INDEPENDENT_AMBULATORY_CARE_PROVIDER_SITE_OTHER): Payer: Managed Care, Other (non HMO) | Admitting: Neurosurgery

## 2023-10-20 ENCOUNTER — Encounter: Payer: Self-pay | Admitting: Neurosurgery

## 2023-10-20 ENCOUNTER — Ambulatory Visit
Admission: RE | Admit: 2023-10-20 | Discharge: 2023-10-20 | Disposition: A | Discharge status: Home / Self Care | Attending: Neurosurgery | Admitting: Neurosurgery

## 2023-10-20 VITALS — BP 110/78 | Ht 62.0 in | Wt 157.0 lb

## 2023-10-20 DIAGNOSIS — Z981 Arthrodesis status: Secondary | ICD-10-CM | POA: Insufficient documentation

## 2023-10-20 DIAGNOSIS — M4804 Spinal stenosis, thoracic region: Secondary | ICD-10-CM | POA: Insufficient documentation

## 2023-10-20 DIAGNOSIS — G9589 Other specified diseases of spinal cord: Secondary | ICD-10-CM

## 2023-10-20 NOTE — Progress Notes (Signed)
   REFERRING PHYSICIAN:  Bryant Capron [address] [place],  [place] [address]  DOS: 01/28/23  T4-9 PSF, T6-7 lamiotomy for tumor   HISTORY OF PRESENT ILLNESS: Paula Garrett is almost 9 months status post above surgery.  She is currently getting chemotherapy.  She has had a recent bone marrow transplant.  Has getting some neuropathy.  Does have intermittent back pain.  PHYSICAL EXAMINATION:  General: Patient is well developed, well nourished, calm, collected, and in no apparent distress.  NEUROLOGICAL:  General: In no acute distress.   Awake, alert, oriented to person, place, and time.  Pupils equal round and reactive to light.  Facial tone is symmetric.     Strength:    Side Iliopsoas Quads Hamstring PF DF EHL  R 5 5 5 5 5 5   L 5 5 5 5 5 5    Incision well healed   ROS (Neurologic):  Negative except as noted above  IMAGING: Thoracic imaging taken today, shows stable hardware, this is not yet been evaluated by radiology, will continue to follow-up on the final read.  ASSESSMENT/PLAN:  Paula Garrett is doing well s/p above surgery. Treatment options reviewed with patient and following plan made:   - Continue with medications from oncology, currently status post bone marrow transplant - Her lower extremity tingling is likely more medically related than from any ongoing compression.  It sounds like she is planning to have a change in her chemotherapy regimen, will continue to follow-up whether or not this is chemotherapy induced neuropathy.  Advised to contact the office if any questions or concerns arise.  Henderson Lock, MD Department of neurosurgery

## 2023-10-22 ENCOUNTER — Other Ambulatory Visit: Payer: Self-pay | Admitting: Oncology

## 2023-10-23 ENCOUNTER — Encounter: Payer: Self-pay | Admitting: Oncology

## 2023-10-23 MED ORDER — HYDROMORPHONE HCL 4 MG PO TABS: 4.0000 mg | ORAL_TABLET | ORAL | 0 refills | Status: DC | PRN

## 2023-10-27 ENCOUNTER — Encounter: Payer: Self-pay | Admitting: Oncology

## 2023-10-27 ENCOUNTER — Other Ambulatory Visit: Payer: Self-pay

## 2023-10-27 ENCOUNTER — Telehealth: Payer: Self-pay | Admitting: *Deleted

## 2023-10-27 NOTE — Telephone Encounter (Signed)
 Case manager Lynnie Saucier at [facility] wanting to talk about Darzalex  fast pro and the code was J911.4 and she wanted a call back at [phone]-extension [phone].  I called and no one answered and it does not have a way to leave a message

## 2023-10-28 ENCOUNTER — Other Ambulatory Visit: Payer: Managed Care, Other (non HMO)

## 2023-10-28 ENCOUNTER — Ambulatory Visit: Payer: Managed Care, Other (non HMO) | Admitting: Oncology

## 2023-10-29 ENCOUNTER — Inpatient Hospital Stay: Attending: Oncology

## 2023-10-29 ENCOUNTER — Inpatient Hospital Stay (HOSPITAL_BASED_OUTPATIENT_CLINIC_OR_DEPARTMENT_OTHER): Admitting: Oncology

## 2023-10-29 ENCOUNTER — Telehealth: Payer: Self-pay

## 2023-10-29 ENCOUNTER — Encounter: Payer: Self-pay | Admitting: Oncology

## 2023-10-29 VITALS — BP 126/81 | HR 91 | Temp 98.0°F | Resp 18 | Ht 62.0 in | Wt 155.2 lb

## 2023-10-29 DIAGNOSIS — Z79891 Long term (current) use of opiate analgesic: Secondary | ICD-10-CM | POA: Insufficient documentation

## 2023-10-29 DIAGNOSIS — Z8719 Personal history of other diseases of the digestive system: Secondary | ICD-10-CM | POA: Diagnosis not present

## 2023-10-29 DIAGNOSIS — G893 Neoplasm related pain (acute) (chronic): Secondary | ICD-10-CM | POA: Insufficient documentation

## 2023-10-29 DIAGNOSIS — Z87891 Personal history of nicotine dependence: Secondary | ICD-10-CM | POA: Insufficient documentation

## 2023-10-29 DIAGNOSIS — Z7952 Long term (current) use of systemic steroids: Secondary | ICD-10-CM | POA: Diagnosis not present

## 2023-10-29 DIAGNOSIS — Z800 Family history of malignant neoplasm of digestive organs: Secondary | ICD-10-CM | POA: Insufficient documentation

## 2023-10-29 DIAGNOSIS — Z9484 Stem cells transplant status: Secondary | ICD-10-CM

## 2023-10-29 DIAGNOSIS — Z7189 Other specified counseling: Secondary | ICD-10-CM

## 2023-10-29 DIAGNOSIS — C9000 Multiple myeloma not having achieved remission: Secondary | ICD-10-CM

## 2023-10-29 DIAGNOSIS — Z8049 Family history of malignant neoplasm of other genital organs: Secondary | ICD-10-CM | POA: Diagnosis not present

## 2023-10-29 DIAGNOSIS — C9001 Multiple myeloma in remission: Secondary | ICD-10-CM

## 2023-10-29 LAB — CBC WITH DIFFERENTIAL (CANCER CENTER ONLY)
Abs Immature Granulocytes: 0.02 10*3/uL (ref 0.00–0.07)
Basophils Absolute: 0 10*3/uL (ref 0.0–0.1)
Basophils Relative: 0 %
Eosinophils Absolute: 0.1 10*3/uL (ref 0.0–0.5)
Eosinophils Relative: 2 %
HCT: 31 % — ABNORMAL LOW (ref 36.0–46.0)
Hemoglobin: 10.1 g/dL — ABNORMAL LOW (ref 12.0–15.0)
Immature Granulocytes: 0 %
Lymphocytes Relative: 62 %
Lymphs Abs: 4.2 10*3/uL — ABNORMAL HIGH (ref 0.7–4.0)
MCH: 29.6 pg (ref 26.0–34.0)
MCHC: 32.6 g/dL (ref 30.0–36.0)
MCV: 90.9 fL (ref 80.0–100.0)
Monocytes Absolute: 0.6 10*3/uL (ref 0.1–1.0)
Monocytes Relative: 8 %
Neutro Abs: 1.9 10*3/uL (ref 1.7–7.7)
Neutrophils Relative %: 28 %
Platelet Count: 215 10*3/uL (ref 150–400)
RBC: 3.41 MIL/uL — ABNORMAL LOW (ref 3.87–5.11)
RDW: 14.5 % (ref 11.5–15.5)
WBC Count: 6.8 10*3/uL (ref 4.0–10.5)
nRBC: 0 % (ref 0.0–0.2)

## 2023-10-29 LAB — CMP (CANCER CENTER ONLY)
ALT: 12 U/L (ref 0–44)
AST: 17 U/L (ref 15–41)
Albumin: 3.5 g/dL (ref 3.5–5.0)
Alkaline Phosphatase: 71 U/L (ref 38–126)
Anion gap: 8 (ref 5–15)
BUN: 12 mg/dL (ref 6–20)
CO2: 26 mmol/L (ref 22–32)
Calcium: 8.9 mg/dL (ref 8.9–10.3)
Chloride: 102 mmol/L (ref 98–111)
Creatinine: 1.04 mg/dL — ABNORMAL HIGH (ref 0.44–1.00)
GFR, Estimated: >60 mL/min (ref >60)
Glucose, Bld: 106 mg/dL — ABNORMAL HIGH (ref 70–99)
Potassium: 3.8 mmol/L (ref 3.5–5.1)
Sodium: 136 mmol/L (ref 135–145)
Total Bilirubin: 0.3 mg/dL (ref 0.0–1.2)
Total Protein: 6.3 g/dL — ABNORMAL LOW (ref 6.5–8.1)

## 2023-10-29 MED ORDER — DEXAMETHASONE 4 MG PO TABS: 20.0000 mg | ORAL_TABLET | Freq: Two times a day (BID) | ORAL | 0 refills | Status: AC

## 2023-10-29 NOTE — Telephone Encounter (Signed)
 Per Dr. Randy Buttery "she needs to resume revlimid  10 mg starting next Tuesday 3 weeks on and 1 week off ".

## 2023-10-29 NOTE — Progress Notes (Unsigned)
 Patient is reporting of severe pain and her current pain medications aren't working.

## 2023-10-30 ENCOUNTER — Other Ambulatory Visit: Payer: Self-pay

## 2023-10-30 ENCOUNTER — Encounter: Payer: Self-pay | Admitting: Oncology

## 2023-10-30 DIAGNOSIS — M549 Dorsalgia, unspecified: Secondary | ICD-10-CM

## 2023-10-30 DIAGNOSIS — C9000 Multiple myeloma not having achieved remission: Secondary | ICD-10-CM

## 2023-10-30 LAB — KAPPA/LAMBDA LIGHT CHAINS
Kappa free light chain: 10.7 mg/L (ref 3.3–19.4)
Kappa, lambda light chain ratio: 1.37 (ref 0.26–1.65)
Lambda free light chains: 7.8 mg/L (ref 5.7–26.3)

## 2023-10-30 MED ORDER — LENALIDOMIDE 10 MG PO CAPS
ORAL_CAPSULE | ORAL | 0 refills | Status: AC
Start: 2023-10-30 — End: ?

## 2023-10-30 NOTE — Telephone Encounter (Signed)
 Per Dr. Randy Buttery "she needs to resume revlimid  10 mg starting next Tuesday 3 weeks on and 1 week off".  Outbound call to patient; voice message left.  On voice message informed patient that a my chart message would be sent covering the details that would have been discussed.

## 2023-10-30 NOTE — Progress Notes (Signed)
 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   She presented to the ER with symptoms of pleuritic chest wall pain and shortness of breath and underwent a CT angio chest which showed a soft tissue mass with bone destruction involving the left seventh rib as well as T6 and T7 vertebral bodies and central canal.  Possibility of cord compression.  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   Results of myeloma work-up from 06/20/2020 showed an elevated IgG of 3668.  M protein was elevated at 3.1 and IFE showed IgG monoclonal lambda protein.  Beta-2  microglobulin and LDH were normal.  Serum free light chain ratio was elevated at 25 with a free light chain lambda of 303.  Bone marrow biopsy showed increased plasma cells 22% by manual count and 20% by IHC staining for CD138.  Cytogenetics for myeloma showed gain of 1 q.   MRI cervical and lumbar thoracic spine  did not show any evidence of lytic lesions.  Bone survey was negative for bone lesions as well.  MRI pelvis with and without contrast showed diffuse patchy heterogeneous marrow concerning for myeloma    Patient had a repeat bone marrow biopsy inAugust 2024 which showed 20% overall plasma cells and hypercellular marrow consistent with plasma cell myeloma.  Cytogenetics normal and FISH studies did not show any abnormalities.  Patient had stabilization procedure/spinal fusion T4-T9 with T6-T7 laminotomies.  Spinal mass biopsy was consistent with plasma cell neoplasm. PET CT scan showed solitary hypermetabolic lytic lesion in the L4 vertebral body consistent with multiple myeloma.  Residual soft tissue density at the left paraspinal resection site T6-T7 with no significant metabolic activity.   Plan was to do outpatient daratumumab  Revlimid  Velcade  dexamethasone  regimen.  However patient was found to be in acute kidney failure with a creatinine of 5 in August 2024 and was sent to the ER.  She received cycle 1 of CyBorD in the hospital with improvement in her creatinine with stabilized between 1.5-1.7.  Treatment has been complicated by repeated hospitalizations for acute hypoxic respiratory failure and left pleural effusion which has been treated with antibiotics and thoracentesis.  Patient only received 1 dose of denosumab  in November 2024 and subsequently had to be held due to significant hypocalcemia.   Subsequently patient's clinical condition improved and she started D VRD regimen as per [facility] protocol on 03/31/2023.  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    Interval history-patient states that back pain has worsened over the last 3 weeks and the present pain regimen is not working for her.  She is on as needed Dilaudid  but is not on  any long-acting pain medication presently.  She also takes gabapentin  300 mg 3 times a day.  She was recently seen by Dr. Henderson Lock from neurosurgery as well.  X-ray was unremarkable and hardware was not displaced.  ECOG PS- 1 Pain scale- 6 Opioid associated constipation- no  Review of systems- Review of Systems  Constitutional:  Negative for chills, fever, malaise/fatigue and weight loss.  HENT:  Negative for congestion, ear discharge and nosebleeds.   Eyes:  Negative for blurred vision.  Respiratory:  Negative for cough, hemoptysis, sputum production, shortness of breath and wheezing.   Cardiovascular:  Negative for chest pain, palpitations, orthopnea and claudication.  Gastrointestinal:  Negative for abdominal pain, blood in stool, constipation, diarrhea, heartburn, melena, nausea and vomiting.  Genitourinary:  Negative for dysuria, flank pain, frequency, hematuria and urgency.  Musculoskeletal:  Positive for back pain. Negative for joint pain and myalgias.  Skin:  Negative for rash.  Neurological:  Negative for dizziness, tingling, focal weakness, seizures, weakness and headaches.  Endo/Heme/Allergies:  Does not bruise/bleed easily.  Psychiatric/Behavioral:  Negative for depression and suicidal ideas. The patient does not have insomnia.       Allergies  Allergen Reactions   Hctz [Hydrochlorothiazide ]     Hyponatremia      Past Medical History:  Diagnosis Date   Anxiety    Arthritis    Asthma    Barrett's esophagus 2015   COVID-19    03/10/20   Depression    GERD (gastroesophageal reflux disease)    Hypertension    Hypothyroidism    Pneumonia    Smoldering myeloma    Thyroid  disease      Past Surgical History:  Procedure Laterality Date   ABLATION  2006   APPLICATION OF INTRAOPERATIVE CT SCAN N/A 01/28/2023   Procedure: APPLICATION OF INTRAOPERATIVE CT SCAN;  Surgeon: Aurther Blue, MD;  Location: [facility] ORS;  Service: Neurosurgery;  Laterality: N/A;    BREAST CYST ASPIRATION Left 1998   BREAST SURGERY     cyst aspiration   BREAST SURGERY  1998   cyst aspiration   COLONOSCOPY  03/2014   COLONOSCOPY WITH PROPOFOL  N/A 01/06/2020   Procedure: COLONOSCOPY WITH PROPOFOL ;  Surgeon: Toledo, Alphonsus Jeans, MD;  Location: [facility] ENDOSCOPY;  Service: Gastroenterology;  Laterality: N/A;   ENDOMETRIAL ABLATION     ESOPHAGOGASTRODUODENOSCOPY (EGD) WITH PROPOFOL  N/A 08/15/2015   Procedure: ESOPHAGOGASTRODUODENOSCOPY (EGD) WITH PROPOFOL ;  Surgeon: Deveron Fly, MD;  Location: [facility] ENDOSCOPY;  Service: Endoscopy;  Laterality: N/A;   ESOPHAGOGASTRODUODENOSCOPY (EGD) WITH PROPOFOL  N/A 01/22/2016   Procedure: ESOPHAGOGASTRODUODENOSCOPY (EGD) WITH PROPOFOL ;  Surgeon: Deveron Fly, MD;  Location: [facility] ENDOSCOPY;  Service: Endoscopy;  Laterality: N/A;   ESOPHAGOGASTRODUODENOSCOPY (EGD) WITH PROPOFOL  N/A 01/06/2020   Procedure: ESOPHAGOGASTRODUODENOSCOPY (EGD) WITH PROPOFOL ;  Surgeon: Toledo, Alphonsus Jeans, MD;  Location: [facility] ENDOSCOPY;  Service: Gastroenterology;  Laterality: N/A;   FLEXIBLE BRONCHOSCOPY Bilateral 03/13/2023   Procedure: FLEXIBLE BRONCHOSCOPY;  Surgeon: Cleve Dale, MD;  Location: [facility] ORS;  Service: Cardiopulmonary;  Laterality: Bilateral;   GASTRIC BYPASS  2005   HERNIA REPAIR     IR BONE MARROW BIOPSY & ASPIRATION  02/05/2023   NASAL SINUS SURGERY     partial amputation Left    left  5th toe   TOTAL THYROIDECTOMY     TUBAL LIGATION      Social History   Socioeconomic History   Marital status: Divorced    Spouse name: Not on file   Number of children: 2   Years of education: Not on file   Highest education level: GED or equivalent  Occupational History   Occupation: Part-time  Tobacco Use   Smoking status: Former    Types: Cigarettes   Smokeless tobacco: Never   Tobacco comments:    Quit smoking 02/16/2023  Vaping Use   Vaping status: Never Used  Substance and Sexual Activity   Alcohol use: Yes    Alcohol/week: 10.0 - 12.0  standard drinks of alcohol    Types: 8 - 10 Glasses of wine, 2 Standard drinks or equivalent per week   Drug use: No   Sexual activity: Yes    Partners: Male  Other Topics Concern   Not on file  Social History Narrative   Lives in [place] single, divorced       Work - 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       Diet - healthy diet   Exercise - limited   Caffeine  use: daily   Social Drivers of Health   Financial Resource Strain: Low Risk  (11/01/2022)   Overall Financial Resource Strain (CARDIA)    Difficulty of Paying Living Expenses: Not very hard  Food Insecurity: No Food Insecurity (08/26/2023)   Hunger Vital Sign    Worried About Running Out of Food in the Last Year: Never true    Ran Out of Food in the Last Year: Never true  Transportation Needs: No Transportation Needs (08/26/2023)   PRAPARE - Administrator, Civil Service (Medical): No    Lack of Transportation (Non-Medical): No  Physical Activity: Insufficiently Active (11/01/2022)   Exercise Vital Sign    Days of Exercise per Week: 3 days    Minutes of Exercise per Session: 20 min  Stress: Stress Concern Present (03/04/2023)   [employer] of Occupational Health - Occupational Stress Questionnaire    Feeling of Stress : Very much  Social Connections: Socially Isolated (03/04/2023)   Social Connection and Isolation Panel [NHANES]    Frequency of Communication with Friends and Family: Never    Frequency of Social Gatherings with Friends and Family: Never    Attends Religious Services: Never    Database administrator or Organizations: No    Attends Banker Meetings: Never    Marital Status: Divorced  Catering manager Violence: Not At Risk (08/26/2023)   Humiliation, Afraid, Rape, and Kick questionnaire    Fear of Current or Ex-Partner: No    Emotionally Abused: No    Physically Abused: No    Sexually Abused: No    Family History  Problem Relation Age of Onset   Heart disease Mother     COPD Mother    Arthritis Mother    Cancer Mother        uterine   Lupus Mother    Anxiety disorder Mother    Depression Mother    Drug abuse Mother    COPD Father    Arthritis Father    Heart disease Father    Heart attack Father    Alcohol abuse Father    Heart disease Brother    Heart attack Brother    Drug abuse Brother    Anxiety disorder Brother    Depression Brother  Heart disease Brother    Cancer Brother        liver cancer age 41 y.o   Diabetes Maternal Grandmother    Diabetes Maternal Grandfather    Diabetes Paternal Grandmother    Diabetes Paternal Grandfather    Breast cancer Neg Hx      Current Outpatient Medications:    dexamethasone  (DECADRON ) 4 MG tablet, Take 5 tablets (20 mg total) by mouth 2 (two) times daily with a meal., Disp: 25 tablet, Rfl: 0   acyclovir  (ZOVIRAX ) 400 MG tablet, Take 1 tablet (400 mg total) by mouth 2 (two) times daily., Disp: 60 tablet, Rfl: 5   aspirin  EC 81 MG tablet, Take 1 tablet (81 mg total) by mouth daily., Disp: 100 tablet, Rfl: 3   butalbital -acetaminophen -caffeine  (FIORICET ) 50-325-40 MG tablet, TAKE 1 TABLET BY MOUTH EVERY 4 HOURS AS NEEDED FOR HEADACHE, Disp: 14 tablet, Rfl: 3   calcium  carbonate (OSCAL) 1500 (600 Ca) MG TABS tablet, Take 1,500 mg by mouth 2 (two) times daily with a meal., Disp: , Rfl:    clonazePAM  (KLONOPIN ) 1 MG tablet, TAKE 1 TABLET(1 MG) BY MOUTH TWICE DAILY, Disp: 60 tablet, Rfl: 2   escitalopram  (LEXAPRO ) 10 MG tablet, Take 1 tablet (10 mg total) by mouth daily., Disp: 30 tablet, Rfl: 2   gabapentin  (NEURONTIN ) 300 MG capsule, Take 300 mg by mouth., Disp: , Rfl:    HYDROmorphone  (DILAUDID ) 4 MG tablet, Take 1 tablet (4 mg total) by mouth every 4 (four) hours as needed for severe pain (pain score 7-10)., Disp: 60 tablet, Rfl: 0   ipratropium-albuterol  (DUONEB) 0.5-2.5 (3) MG/3ML SOLN, Take 3 mLs by nebulization 4 (four) times daily., Disp: 360 mL, Rfl: 0   lenalidomide  (REVLIMID ) 10 MG capsule,  Take 1 capsule every day for 3 weeks on and then hold for 1 week.  [employer]- [id number]  Date obtained: 10/30/23, Disp: 28 capsule, Rfl: 0   levothyroxine  (SYNTHROID ) 125 MCG tablet, Take 2 tablets (250 mcg total) by mouth daily before breakfast., Disp: 60 tablet, Rfl: 0   pantoprazole  (PROTONIX ) 40 MG tablet, 30 min before lunch or dinner, Disp: 90 tablet, Rfl: 3   potassium chloride  SA (KLOR-CON  M) 20 MEQ tablet, Take 20 mEq by mouth 2 (two) times daily., Disp: , Rfl:    prochlorperazine  (COMPAZINE ) 10 MG tablet, TAKE 1 TABLET(10 MG) BY MOUTH EVERY 6 HOURS AS NEEDED FOR NAUSEA OR VOMITING, Disp: 30 tablet, Rfl: 2   Spacer/Aero-Holding Chambers (AEROCHAMBER MV) inhaler, Use as instructed, Disp: 1 each, Rfl: 0   Specialty Vitamins Products (MG PLUS PROTEIN) 133 MG TABS, Take 1 tablet by mouth every morning., Disp: , Rfl:    thiamine  (VITAMIN B-1) 100 MG tablet, Take 1 tablet (100 mg total) by mouth daily., Disp: 30 tablet, Rfl: 0   zolpidem  (AMBIEN ) 10 MG tablet, TAKE 1/2 TO 1 TABLET(5 TO 10 MG) BY MOUTH AT BEDTIME AS NEEDED FOR SLEEP, Disp: 30 tablet, Rfl: 2  Physical exam:  Vitals:   10/29/23 1039  BP: 126/81  Pulse: 91  Resp: 18  Temp: 98 F (36.7 C)  TempSrc: Tympanic  SpO2: 95%  Weight: 155 lb 3.2 oz (70.4 kg)  Height: 5\' 2"  (1.575 m)   Physical Exam Cardiovascular:     Rate and Rhythm: Normal rate and regular rhythm.     Heart sounds: Normal heart sounds.  Pulmonary:     Effort: Pulmonary effort is normal.     Breath sounds: Normal breath sounds.  Skin:  General: Skin is warm and dry.  Neurological:     Mental Status: She is alert and oriented to person, place, and time.     Motor: No weakness.     Gait: Gait normal.      I have personally reviewed labs listed below:    Latest Ref Rng & Units 10/29/2023   10:24 AM  CMP  Glucose 70 - 99 mg/dL 161   BUN 6 - 20 mg/dL 12   Creatinine 0.96 - 1.00 mg/dL 0.45   Sodium 409 - 811 mmol/L 136   Potassium 3.5 - 5.1 mmol/L 3.8    Chloride 98 - 111 mmol/L 102   CO2 22 - 32 mmol/L 26   Calcium  8.9 - 10.3 mg/dL 8.9   Total Protein 6.5 - 8.1 g/dL 6.3   Total Bilirubin 0.0 - 1.2 mg/dL 0.3   Alkaline Phos 38 - 126 U/L 71   AST 15 - 41 U/L 17   ALT 0 - 44 U/L 12       Latest Ref Rng & Units 10/29/2023   10:24 AM  CBC  WBC 4.0 - 10.5 K/uL 6.8   Hemoglobin 12.0 - 15.0 g/dL 91.4   Hematocrit 78.2 - 46.0 % 31.0   Platelets 150 - 400 K/uL 215    I have personally reviewed Radiology images listed below: No images are attached to the encounter.  DG Thoracic Spine 2 View Result Date: 10/29/2023 CLINICAL DATA:  Status post surgical posterior fusion of thoracic spine. EXAM: THORACIC SPINE 2 VIEWS COMPARISON:  April 21, 2023. FINDINGS: Status post surgical posterior fusion extending from T4-T9 bilateral intrapedicular screw placement. Moderate thoracic kyphosis remains. No acute fracture or spondylolisthesis is noted. IMPRESSION: Stable postsurgical changes as noted above. Electronically Signed   By: Rosalene Colon M.D.   On: 10/29/2023 15:27     Assessment and plan- Patient is a 58 y.o. female with history of standard risk R-ISS stage II IgG lambda multiple myeloma s/p induction D VRD regimen followed by autoLog a stem cell transplantation here to discuss posttransplant treatment  Patient received 1 cycle of consolidation even before going for her transplant.  I will plan to offer her cycle 6 of consolidation treatment starting next week.  She has plans to go out of town and therefore I will be giving her Velcade  1.5 mg/m 2 weeks on and 1 week off along with Darzalex  on day 1.  She will take Revlimid  10 mg 2 weeks on and 1 week off for cycle 6.  Following that we will move her to maintenance treatment as per [facility] protocol.  I will plan to give her monthly Darzalex  along with Revlimid  10 mg 3 weeks on and 1 week off.  After about 3 to 4 months of monthly Darzalex  I will drop Darzalex  altogether and continue single agent  Revlimid  alone as per [place] trial.  We are sending new prescription for Revlimid  which she will start taking next week.  She will also restart aspirin  prophylaxis.  She is already on Valtrex prophylaxis.  Treatment will start around day 90 posttransplant.  Back pain: I will discuss her case with Dr. Henderson Lock from neurosurgery who saw her recently and x-rays did not show any concern with heart ware that she has had from prior surgery.  Myeloma labs also did not show any overt evidence of progression.  I am giving her a short course of Decadron  40 mg daily for 5 days.  I will consider getting  a repeat MRI given her ongoing pain.  She will continue with as needed Dilaudid  at this time.  Have also asked her to increase the dose of gabapentin  from 300 mg 3 times a day to 600 mg 3 times a day.  I will consider adding long-acting pain medication at some point.  I will tentatively see her back in 2 weeks for cycle 6-day 8 of Velcade    Visit Diagnosis 1. Goals of care, counseling/discussion   2. Neoplasm related pain   3. Multiple myeloma in remission [facility])      Dr. Seretha Dance, MD, MPH [facility] [phone] 10/30/2023 10:18 AM

## 2023-10-30 NOTE — Telephone Encounter (Signed)
 Outbound call to [employer] to reactivate [employer]; on website reads [employer]S de-enrolled 08/01/23.  Spoke with [facility] who assisted me with re-enrolling patient into [employer]S program.  REMS authorization obtained and refill processed.

## 2023-10-31 ENCOUNTER — Telehealth: Payer: Self-pay | Admitting: *Deleted

## 2023-10-31 NOTE — Telephone Encounter (Signed)
 Called [facility] pharmacy regarding discrepancies in Revlimid  prescription sent yesterday. I spoke to a pharmacist. Based on prescription sent she is to be taking revlimid  10 mg for 3 weeks or 21 days, then off for 7 days. # of tablets should be 21 ( Rx said 28 tabs) with no refills. Pharmacist also stated pt had been on a 2 week cycle of Revlimid , I saw Dr Denese Finn note and confirmed she is making a change in dosage with this cycle. Pharmacist is pushing drug refill forward.

## 2023-10-31 NOTE — Telephone Encounter (Signed)
 Alicia at [facility] says that they need to get nurse to call  in and clarify about the cycles days off and the duration.  You can call at [phone] .  Reference [id number] CC

## 2023-11-03 ENCOUNTER — Encounter: Payer: Self-pay | Admitting: Oncology

## 2023-11-03 ENCOUNTER — Other Ambulatory Visit: Payer: Self-pay | Admitting: Oncology

## 2023-11-03 ENCOUNTER — Telehealth: Payer: Self-pay | Admitting: *Deleted

## 2023-11-03 DIAGNOSIS — C9000 Multiple myeloma not having achieved remission: Secondary | ICD-10-CM

## 2023-11-03 LAB — MULTIPLE MYELOMA PANEL, SERUM
Albumin SerPl Elph-Mcnc: 3.3 g/dL (ref 2.9–4.4)
Albumin/Glob SerPl: 1.4 (ref 0.7–1.7)
Alpha 1: 0.3 g/dL (ref 0.0–0.4)
Alpha2 Glob SerPl Elph-Mcnc: 0.8 g/dL (ref 0.4–1.0)
B-Globulin SerPl Elph-Mcnc: 0.9 g/dL (ref 0.7–1.3)
Gamma Glob SerPl Elph-Mcnc: 0.4 g/dL (ref 0.4–1.8)
Globulin, Total: 2.4 g/dL (ref 2.2–3.9)
IgA: 21 mg/dL — ABNORMAL LOW (ref 87–352)
IgG (Immunoglobin G), Serum: 543 mg/dL — ABNORMAL LOW (ref 586–1602)
IgM (Immunoglobulin M), Srm: 18 mg/dL — ABNORMAL LOW (ref 26–217)
Total Protein ELP: 5.7 g/dL — ABNORMAL LOW (ref 6.0–8.5)

## 2023-11-03 NOTE — Telephone Encounter (Signed)
 Tonya case manager from [employer] is wanting the nurse from Dr. Denese Finn team to call her [phone] extension [phone] she is working on confirm ing on treatment plan

## 2023-11-03 NOTE — Telephone Encounter (Signed)
 Please call [employer]. If they are looking into doing darzalex  at home for her, we are not allowing that. She needs to get darzalex  at the cancer center. FYI

## 2023-11-04 ENCOUNTER — Ambulatory Visit

## 2023-11-04 ENCOUNTER — Encounter: Payer: Self-pay | Admitting: Oncology

## 2023-11-04 ENCOUNTER — Ambulatory Visit
Admission: RE | Admit: 2023-11-04 | Discharge: 2023-11-04 | Disposition: A | Discharge status: Home / Self Care | Source: Ambulatory Visit | Attending: Oncology | Admitting: Oncology

## 2023-11-04 DIAGNOSIS — M549 Dorsalgia, unspecified: Secondary | ICD-10-CM | POA: Diagnosis present

## 2023-11-04 MED ORDER — IOHEXOL 300 MG/ML  SOLN
75.0000 mL | Freq: Once | INTRAMUSCULAR | Status: AC | PRN
  Administered 2023-11-04: 75 mL via INTRAVENOUS

## 2023-11-05 ENCOUNTER — Inpatient Hospital Stay

## 2023-11-05 ENCOUNTER — Other Ambulatory Visit: Payer: Self-pay

## 2023-11-05 ENCOUNTER — Telehealth: Payer: Self-pay | Admitting: *Deleted

## 2023-11-05 ENCOUNTER — Encounter: Payer: Self-pay | Admitting: Oncology

## 2023-11-05 VITALS — BP 137/83 | HR 71 | Temp 97.1°F | Resp 18 | Wt 153.8 lb

## 2023-11-05 DIAGNOSIS — C9000 Multiple myeloma not having achieved remission: Secondary | ICD-10-CM

## 2023-11-05 DIAGNOSIS — G893 Neoplasm related pain (acute) (chronic): Secondary | ICD-10-CM

## 2023-11-05 LAB — CBC WITH DIFFERENTIAL (CANCER CENTER ONLY)
Abs Immature Granulocytes: 0.04 10*3/uL (ref 0.00–0.07)
Basophils Absolute: 0 10*3/uL (ref 0.0–0.1)
Basophils Relative: 1 %
Eosinophils Absolute: 0.1 10*3/uL (ref 0.0–0.5)
Eosinophils Relative: 1 %
HCT: 34.4 % — ABNORMAL LOW (ref 36.0–46.0)
Hemoglobin: 11.2 g/dL — ABNORMAL LOW (ref 12.0–15.0)
Immature Granulocytes: 1 %
Lymphocytes Relative: 44 %
Lymphs Abs: 3.6 10*3/uL (ref 0.7–4.0)
MCH: 29.4 pg (ref 26.0–34.0)
MCHC: 32.6 g/dL (ref 30.0–36.0)
MCV: 90.3 fL (ref 80.0–100.0)
Monocytes Absolute: 0.4 10*3/uL (ref 0.1–1.0)
Monocytes Relative: 5 %
Neutro Abs: 4 10*3/uL (ref 1.7–7.7)
Neutrophils Relative %: 48 %
Platelet Count: 273 10*3/uL (ref 150–400)
RBC: 3.81 MIL/uL — ABNORMAL LOW (ref 3.87–5.11)
RDW: 14 % (ref 11.5–15.5)
WBC Count: 8.1 10*3/uL (ref 4.0–10.5)
nRBC: 0 % (ref 0.0–0.2)

## 2023-11-05 LAB — CMP (CANCER CENTER ONLY)
ALT: 11 U/L (ref 0–44)
AST: 15 U/L (ref 15–41)
Albumin: 3.7 g/dL (ref 3.5–5.0)
Alkaline Phosphatase: 73 U/L (ref 38–126)
Anion gap: 8 (ref 5–15)
BUN: 10 mg/dL (ref 6–20)
CO2: 26 mmol/L (ref 22–32)
Calcium: 9.1 mg/dL (ref 8.9–10.3)
Chloride: 103 mmol/L (ref 98–111)
Creatinine: 0.89 mg/dL (ref 0.44–1.00)
GFR, Estimated: >60 mL/min (ref >60)
Glucose, Bld: 110 mg/dL — ABNORMAL HIGH (ref 70–99)
Potassium: 3.7 mmol/L (ref 3.5–5.1)
Sodium: 137 mmol/L (ref 135–145)
Total Bilirubin: 0.4 mg/dL (ref 0.0–1.2)
Total Protein: 6.6 g/dL (ref 6.5–8.1)

## 2023-11-05 MED ORDER — ACETAMINOPHEN 325 MG PO TABS
650.0000 mg | ORAL_TABLET | Freq: Once | ORAL | Status: AC
  Administered 2023-11-05: 650 mg via ORAL
  Filled 2023-11-05: qty 2

## 2023-11-05 MED ORDER — HYDROMORPHONE HCL 1 MG/ML IJ SOLN: 1.0000 mg | Freq: Once | INTRAMUSCULAR | Status: DC

## 2023-11-05 MED ORDER — DEXAMETHASONE 4 MG PO TABS
20.0000 mg | ORAL_TABLET | Freq: Once | ORAL | Status: AC
  Administered 2023-11-05: 20 mg via ORAL
  Filled 2023-11-05: qty 5

## 2023-11-05 MED ORDER — HYDROMORPHONE HCL 4 MG PO TABS
4.0000 mg | ORAL_TABLET | ORAL | 0 refills | Status: DC | PRN
Start: 2023-11-05 — End: 2023-11-20

## 2023-11-05 MED ORDER — BORTEZOMIB CHEMO SQ INJECTION 3.5 MG (2.5MG/ML)
1.5000 mg/m2 | Freq: Once | INTRAMUSCULAR | Status: AC
  Administered 2023-11-05: 2.75 mg via SUBCUTANEOUS
  Filled 2023-11-05: qty 1.1

## 2023-11-05 MED ORDER — OXYCODONE HCL 5 MG PO TABS
10.0000 mg | ORAL_TABLET | Freq: Once | ORAL | Status: DC
  Filled 2023-11-05: qty 2

## 2023-11-05 MED ORDER — ACETAMINOPHEN 325 MG PO TABS: 650.0000 mg | ORAL_TABLET | Freq: Once | ORAL | Status: DC

## 2023-11-05 MED ORDER — DARATUMUMAB-HYALURONIDASE-FIHJ 1800-30000 MG-UT/15ML ~~LOC~~ SOLN
1800.0000 mg | Freq: Once | SUBCUTANEOUS | Status: AC
  Administered 2023-11-05: 1800 mg via SUBCUTANEOUS
  Filled 2023-11-05: qty 15

## 2023-11-05 MED ORDER — DIPHENHYDRAMINE HCL 25 MG PO CAPS
50.0000 mg | ORAL_CAPSULE | Freq: Once | ORAL | Status: AC
  Administered 2023-11-05: 50 mg via ORAL
  Filled 2023-11-05: qty 2

## 2023-11-05 MED ORDER — OXYCODONE HCL 5 MG PO TABS
10.0000 mg | ORAL_TABLET | Freq: Once | ORAL | Status: AC
  Administered 2023-11-05: 10 mg via ORAL

## 2023-11-05 NOTE — Telephone Encounter (Signed)
 Lynnie Saucier -case manager at [employer] called and asked if Paula Garrett could call her back about the approval of the Darzalex  Faspro telephone [phone] tension [id number].

## 2023-11-05 NOTE — Telephone Encounter (Signed)
 Per Fairy Homer -case manager at [facility] called and asked if Paula Garrett could call her back about the approval of the Darzalex  Faspro telephone [phone] tension [phone]."  Outbound call; detailed voice message left for Paula Garrett indicating that per Dr. Randy Buttery [name] is to be administered in clinic to the patient, not in the home setting.  Informed if there's any other questions / concerns to contact the clinic.

## 2023-11-05 NOTE — Telephone Encounter (Addendum)
 Per Dr. Randy Buttery proceed with Dilaudid  refill; per [facility] CMA when she checked earlier this week it was too soon to fill script. Outbound call to [facility] to speak with a pharmacist to confirm the refill will not be held and indeed processed.  Spoke to pharmacy tech who transferred me to pharmacist to ensure the script will not be held when received.  Transferred to pharmacist who indicated medication was picked up on 10/23/23 and should last for 10 days.  Patient is outside 10 day window so refill should go through.  Rph indicated if there are any issues with insurance for example they will follow up with a fax.

## 2023-11-06 ENCOUNTER — Encounter: Payer: Self-pay | Admitting: Student in an Organized Health Care Education/Training Program

## 2023-11-06 ENCOUNTER — Telehealth: Payer: Self-pay | Admitting: *Deleted

## 2023-11-06 ENCOUNTER — Ambulatory Visit: Payer: Managed Care, Other (non HMO) | Admitting: Student in an Organized Health Care Education/Training Program

## 2023-11-06 ENCOUNTER — Encounter: Payer: Self-pay | Admitting: Oncology

## 2023-11-06 VITALS — BP 112/70 | HR 73 | Ht 62.0 in | Wt 158.0 lb

## 2023-11-06 DIAGNOSIS — J90 Pleural effusion, not elsewhere classified: Secondary | ICD-10-CM | POA: Diagnosis not present

## 2023-11-06 DIAGNOSIS — R0602 Shortness of breath: Secondary | ICD-10-CM

## 2023-11-06 DIAGNOSIS — Z87891 Personal history of nicotine dependence: Secondary | ICD-10-CM

## 2023-11-06 DIAGNOSIS — C9000 Multiple myeloma not having achieved remission: Secondary | ICD-10-CM

## 2023-11-06 NOTE — Telephone Encounter (Signed)
 Tonya the case manager is leaving  the patient up options. Terrea Ferrier already told Lynnie Saucier that doing the DARZALEX  FASPRO at home is not owing to work. Next is redirection whiten bagging Clear bagging possibility through billing pharmacy benefits Next is single case agreement where [facility] will negotiate a price and that is the price that the cancer center has to take; The last 1 is clinical exception.   Telephone for [facility] case manager [phone] with an extension [phone]

## 2023-11-06 NOTE — Telephone Encounter (Signed)
 Duplicate note created in error.

## 2023-11-06 NOTE — Telephone Encounter (Signed)
 Secure chat sent to Darlena Clark indicating the details listed below.  Darlena wrote to also please feel free to share her contact info with any CM. She would be more than happy to assist [phone].

## 2023-11-06 NOTE — Telephone Encounter (Signed)
 Lynnie Saucier the case manager left a message for Paula Garrett on this patient.  Lynnie Saucier states that they can try to redirection having white bagging for the pharmacy.  It 1 is clear bagging possibility through billing to the pharmacy benefits and would have to have the MPI to see if that could be done, a single case agreement to negotiate a price that is good for [employer] and good for pharmacy.  Or clinical exception. Debria Fang is #1-8 [phone] extension [phone]

## 2023-11-06 NOTE — Progress Notes (Signed)
 Assessment & Plan:   1. Shortness of breath (Primary) 2. Multiple myeloma not having achieved remission (HCC) 3. Recurrent left pleural effusion  Patient initially seen for shortness of breath, cough, and wheeze that was concerning for Asthma vs COPD based on symptoms.  PFTs in January 2025 at [facility] showed FEV1/FVC of 0.63, FEV1 of 1.16 L (54% predicted) overall consistent with a diagnosis of COPD.  TLC was 75% of predicted while DLCO was 77% of predicted.  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  She is tolerating this well which we will continue.  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  She had had a left-sided pleural effusion previously noted on CT and drained with chest tube placement.  I have reviewed her most recent CT scan of the thoracic and lumbar spines and reviewed the pulmonary windows and do not see any pleural effusion.  Given this, there will be no need for a dedicated CT scan of the chest moving forward.   -Continue Breztri  2 puffs twice daily -Continue with smoking cessation and vape cessation  Return in about 6 months (around 05/08/2024).  I spent 30 minutes caring for this patient today, including preparing to see the patient, obtaining a medical history , reviewing a separately obtained history, performing a medically appropriate examination and/or evaluation, counseling and educating the patient/family/caregiver, ordering medications, tests, or procedures, documenting clinical information in the electronic health record, and independently interpreting results (not separately reported/billed) and communicating results to the patient/family/caregiver  Vergia Glasgow, MD [facility]   End of visit  medications:  No orders of the defined types were placed in this encounter.    Current Outpatient Medications:    acyclovir  (ZOVIRAX ) 400 MG tablet, Take 1 tablet (400 mg total) by mouth 2 (two) times daily., Disp: 60 tablet, Rfl: 5   aspirin  EC 81 MG tablet, Take 1 tablet (81 mg total) by mouth daily., Disp: 100 tablet, Rfl: 3   butalbital -acetaminophen -caffeine  (FIORICET ) 50-325-40 MG tablet, TAKE 1 TABLET BY MOUTH EVERY 4 HOURS AS NEEDED FOR HEADACHE, Disp: 14 tablet, Rfl: 3   calcium  carbonate (OSCAL) 1500 (600 Ca) MG TABS tablet, Take 1,500 mg by mouth 2 (two) times daily with a meal., Disp: , Rfl:    clonazePAM  (KLONOPIN ) 1 MG tablet, TAKE 1 TABLET(1 MG) BY MOUTH TWICE DAILY, Disp: 60 tablet, Rfl: 2   dexamethasone  (DECADRON ) 4 MG tablet, Take 5 tablets (20 mg total) by mouth 2 (two) times daily with a meal., Disp: 25 tablet, Rfl: 0   escitalopram  (LEXAPRO ) 10 MG tablet, Take 1 tablet (10 mg total) by mouth daily., Disp: 30 tablet, Rfl: 2   gabapentin  (NEURONTIN ) 300 MG capsule, Take 300 mg by mouth., Disp: , Rfl:    HYDROmorphone  (DILAUDID ) 4 MG tablet, Take 1 tablet (4 mg total) by mouth every 4 (four) hours as needed for severe pain (pain score 7-10)., Disp: 60 tablet, Rfl: 0   ipratropium-albuterol  (DUONEB) 0.5-2.5 (3) MG/3ML SOLN, Take 3 mLs by nebulization 4 (four) times daily., Disp: 360 mL, Rfl: 0   lenalidomide  (REVLIMID ) 10 MG capsule, Take 1 capsule every day for 3 weeks on and then hold for 1 week.  Cel[employer]- [id number]  Date obtained: 10/30/23, Disp: 28 capsule, Rfl: 0   levothyroxine  (SYNTHROID )  125 MCG tablet, Take 2 tablets (250 mcg total) by mouth daily before breakfast., Disp: 60 tablet, Rfl: 0   pantoprazole  (PROTONIX ) 40 MG tablet, 30 min before lunch or dinner, Disp: 90 tablet, Rfl: 3   potassium chloride  SA (KLOR-CON  M) 20 MEQ tablet, Take 20 mEq by mouth 2 (two) times daily., Disp: , Rfl:    prochlorperazine  (COMPAZINE ) 10 MG tablet, TAKE 1 TABLET(10 MG) BY MOUTH EVERY 6  HOURS AS NEEDED FOR NAUSEA OR VOMITING, Disp: 30 tablet, Rfl: 2   Spacer/Aero-Holding Chambers (AEROCHAMBER MV) inhaler, Use as instructed, Disp: 1 each, Rfl: 0   Specialty Vitamins Products (MG PLUS PROTEIN) 133 MG TABS, Take 1 tablet by mouth every morning., Disp: , Rfl:    thiamine  (VITAMIN B-1) 100 MG tablet, Take 1 tablet (100 mg total) by mouth daily., Disp: 30 tablet, Rfl: 0   zolpidem  (AMBIEN ) 10 MG tablet, TAKE 1/2 TO 1 TABLET(5 TO 10 MG) BY MOUTH AT BEDTIME AS NEEDED FOR SLEEP, Disp: 30 tablet, Rfl: 2   Subjective:   PATIENT ID: Paula Garrett GENDER: female DOB: May 10, 1966, MRN: [id number]  Chief Complaint  Patient presents with   Shortness of Breath    No concerns    HPI  Ms. Ekstein is a pleasant 58 year old female presenting for follow up.  She continues to feel well from a respiratory point of view.  She did have her stem cell transplant at [facility] and was admitted following that due to complications but has overall returned to her baseline.  She has an occasional cough and some mild exertional dyspnea but this is close to where she was prior to all of this.  During her hospitalization, she was noted to have pulmonary edema versus TACO versus engraftment syndrome which was managed conservatively.  She is compliant with her Breztri  inhaler and has not had any issues with its use.  She has not had any increase in the wheeze, chest tightness, chest pain, fevers, or chills.  She has had a CT scan of her thoracic and lumbar spine ordered by her oncologist.  She had her PFTs at [facility] prior to her autologous stem cell transplant.    During our initial visit, she'd reported a 6 month history of exertional dyspnea, cough, wheeze, and sputum production. The cough was productive of whitish sputum. She reported a wheeze and a rattling in her chest without any chest pain or tightness.   Following that visit, she presented to the ED with the chief complaint of chest discomfort and chest pain that was  described as pleuritic. CXR showed a pleural based nodular opacity in the left hemithorax, and follow up chest CT showed a soft tissue mass at T6-T7 with destruction of the 7th rib and invasion of T6-T7 vertebral bodies and with spinal canal involvement. She was admitted to the hospital and seen by oncology and neurosurgery. She underwent spinal stabilization surgery on 01/28/2023 and the mass diagnosed as a plasmacytoma and diagnosis of multiple myeloma made. No pleural effusion was noted at the time of that admission.   Patient subsequently followed by oncology with plan to initiate chemotherapy.This was complicated by hyponatremia again requiring admission to the hospital. Imaging during said admission is notable for the development of left sided pleural effusion. Patient then had two admissions to the hospital for shortness of breath, found to have the left sided pleural effusion. She initially had a thoracentesis, with re-accumulation, and subsequent placement of a chest tube. Pleural fluid was noted to  be exudative, and predominantly lymphocytic. POCUS was also noted for a right sided consolidation. Patient was initiated on broad spectrum antibiotics with sputum culture growing MRSA. Bronchoscopy was performed while she was in the hospital and respiratory cultures obtained grew candida (1000 colonies). Chest tube was discontinued and her symptoms improved and she was discharged with plans for outpatient follow up.   She has a long standing history of smoking, and has smoked a pack a day for over 30 years. She works as a Industrial/product designer. She doesn't have any other occupational exposures and denies illicit drug use.  Ancillary information including prior medications, full medical/surgical/family/social histories, and PFTs (when available) are listed below and have been reviewed.   Review of Systems  Constitutional:  Negative for chills, fever, malaise/fatigue and weight loss.  Respiratory:  Negative for  cough, hemoptysis, sputum production, shortness of breath and wheezing.   Cardiovascular:  Negative for chest pain.  Skin:  Negative for rash.     Objective:   Vitals:   11/06/23 1514  BP: 112/70  Pulse: 73  SpO2: 95%  Weight: 158 lb (71.7 kg)  Height: 5\' 2"  (1.575 m)   95% on RA BMI Readings from Last 3 Encounters:  11/06/23 28.90 kg/m  11/05/23 28.13 kg/m  10/29/23 28.39 kg/m   Wt Readings from Last 3 Encounters:  11/06/23 158 lb (71.7 kg)  11/05/23 153 lb 12.3 oz (69.7 kg)  10/29/23 155 lb 3.2 oz (70.4 kg)    Physical Exam Constitutional:      Appearance: Normal appearance. She is obese.  Eyes:     Pupils: Pupils are equal, round, and reactive to light.  Cardiovascular:     Rate and Rhythm: Normal rate and regular rhythm.  Pulmonary:     Effort: Pulmonary effort is normal.     Breath sounds: Normal breath sounds. No stridor. No wheezing or rales.  Neurological:     General: No focal deficit present.     Mental Status: She is alert and oriented to person, place, and time. Mental status is at baseline.       Ancillary Information    Past Medical History:  Diagnosis Date   Anxiety    Arthritis    Asthma    Barrett's esophagus 2015   COVID-19    03/10/20   Depression    GERD (gastroesophageal reflux disease)    Hypertension    Hypothyroidism    Pneumonia    Smoldering myeloma    Thyroid  disease      Family History  Problem Relation Age of Onset   Heart disease Mother    COPD Mother    Arthritis Mother    Cancer Mother        uterine   Lupus Mother    Anxiety disorder Mother    Depression Mother    Drug abuse Mother    COPD Father    Arthritis Father    Heart disease Father    Heart attack Father    Alcohol abuse Father    Heart disease Brother    Heart attack Brother    Drug abuse Brother    Anxiety disorder Brother    Depression Brother    Heart disease Brother    Cancer Brother        liver cancer age 52 y.o   Diabetes  Maternal Grandmother    Diabetes Maternal Grandfather    Diabetes Paternal Grandmother    Diabetes Paternal Grandfather    Breast cancer Neg  Hx      Past Surgical History:  Procedure Laterality Date   ABLATION  2006   APPLICATION OF INTRAOPERATIVE CT SCAN N/A 01/28/2023   Procedure: APPLICATION OF INTRAOPERATIVE CT SCAN;  Surgeon: Aurther Blue, MD;  Location: [facility] ORS;  Service: Neurosurgery;  Laterality: N/A;   BREAST CYST ASPIRATION Left 1998   BREAST SURGERY     cyst aspiration   BREAST SURGERY  1998   cyst aspiration   COLONOSCOPY  03/2014   COLONOSCOPY WITH PROPOFOL  N/A 01/06/2020   Procedure: COLONOSCOPY WITH PROPOFOL ;  Surgeon: Toledo, Alphonsus Jeans, MD;  Location: [facility] ENDOSCOPY;  Service: Gastroenterology;  Laterality: N/A;   ENDOMETRIAL ABLATION     ESOPHAGOGASTRODUODENOSCOPY (EGD) WITH PROPOFOL  N/A 08/15/2015   Procedure: ESOPHAGOGASTRODUODENOSCOPY (EGD) WITH PROPOFOL ;  Surgeon: Deveron Fly, MD;  Location: [facility] ENDOSCOPY;  Service: Endoscopy;  Laterality: N/A;   ESOPHAGOGASTRODUODENOSCOPY (EGD) WITH PROPOFOL  N/A 01/22/2016   Procedure: ESOPHAGOGASTRODUODENOSCOPY (EGD) WITH PROPOFOL ;  Surgeon: Deveron Fly, MD;  Location: [facility] ENDOSCOPY;  Service: Endoscopy;  Laterality: N/A;   ESOPHAGOGASTRODUODENOSCOPY (EGD) WITH PROPOFOL  N/A 01/06/2020   Procedure: ESOPHAGOGASTRODUODENOSCOPY (EGD) WITH PROPOFOL ;  Surgeon: Toledo, Alphonsus Jeans, MD;  Location: [facility] ENDOSCOPY;  Service: Gastroenterology;  Laterality: N/A;   FLEXIBLE BRONCHOSCOPY Bilateral 03/13/2023   Procedure: FLEXIBLE BRONCHOSCOPY;  Surgeon: Cleve Dale, MD;  Location: [facility] ORS;  Service: Cardiopulmonary;  Laterality: Bilateral;   GASTRIC BYPASS  2005   HERNIA REPAIR     IR BONE MARROW BIOPSY & ASPIRATION  02/05/2023   NASAL SINUS SURGERY     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   TOTAL THYROIDECTOMY     TUBAL LIGATION      Social History   Socioeconomic History   Marital status: Divorced     Spouse name: Not on file   Number of children: 2   Years of education: Not on file   Highest education level: GED or equivalent  Occupational History   Occupation: Part-time  Tobacco Use   Smoking status: Former    Types: Cigarettes   Smokeless tobacco: Never   Tobacco comments:    Quit smoking 02/16/2023  Vaping Use   Vaping status: Never Used  Substance and Sexual Activity   Alcohol use: Yes    Alcohol/week: 10.0 - 12.0 standard drinks of alcohol    Types: 8 - 10 Glasses of wine, 2 Standard drinks or equivalent per week   Drug use: No   Sexual activity: Yes    Partners: Male  Other Topics Concern   Not on file  Social History Narrative   Lives in [place] single, divorced       Work - 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       Diet - healthy diet   Exercise - limited   Caffeine  use: daily   Social Drivers of Corporate investment banker Strain: Low Risk  (11/01/2022)   Overall Financial Resource Strain (CARDIA)    Difficulty of Paying Living Expenses: Not very hard  Food Insecurity: No Food Insecurity (08/26/2023)   Hunger Vital Sign    Worried About Running Out of Food in the Last Year: Never true    Ran Out of Food in the Last Year: Never true  Transportation Needs: No Transportation Needs (08/26/2023)   PRAPARE - Administrator, Civil Service (Medical): No    Lack of Transportation (Non-Medical): No  Physical Activity: Insufficiently Active (11/01/2022)   Exercise Vital Sign  Days of Exercise per Week: 3 days    Minutes of Exercise per Session: 20 min  Stress: Stress Concern Present (03/04/2023)   [employer] of Occupational Health - Occupational Stress Questionnaire    Feeling of Stress : Very much  Social Connections: Socially Isolated (03/04/2023)   Social Connection and Isolation Panel [NHANES]    Frequency of Communication with Friends and Family: Never    Frequency of Social Gatherings with Friends and Family: Never    Attends Religious  Services: Never    Database administrator or Organizations: No    Attends Banker Meetings: Never    Marital Status: Divorced  Catering manager Violence: Not At Risk (08/26/2023)   Humiliation, Afraid, Rape, and Kick questionnaire    Fear of Current or Ex-Partner: No    Emotionally Abused: No    Physically Abused: No    Sexually Abused: No     Allergies  Allergen Reactions   Hctz [Hydrochlorothiazide ]     Hyponatremia      CBC    Component Value Date/Time   WBC 8.1 11/05/2023 1021   WBC 4.8 09/01/2023 1133   RBC 3.81 (L) 11/05/2023 1021   HGB 11.2 (L) 11/05/2023 1021   HGB 7.4 (L) 02/10/2023 2009   HCT 34.4 (L) 11/05/2023 1021   HCT 38.5 07/02/2011 1529   PLT 273 11/05/2023 1021   PLT 311 07/02/2011 1529   MCV 90.3 11/05/2023 1021   MCV 90 07/02/2011 1529   MCH 29.4 11/05/2023 1021   MCHC 32.6 11/05/2023 1021   RDW 14.0 11/05/2023 1021   RDW 13.4 07/02/2011 1529   LYMPHSABS 3.6 11/05/2023 1021   MONOABS 0.4 11/05/2023 1021   EOSABS 0.1 11/05/2023 1021   BASOSABS 0.0 11/05/2023 1021    Pulmonary Functions Testing Results:     No data to display          Outpatient Medications Prior to Visit  Medication Sig Dispense Refill   acyclovir  (ZOVIRAX ) 400 MG tablet Take 1 tablet (400 mg total) by mouth 2 (two) times daily. 60 tablet 5   aspirin  EC 81 MG tablet Take 1 tablet (81 mg total) by mouth daily. 100 tablet 3   butalbital -acetaminophen -caffeine  (FIORICET ) 50-325-40 MG tablet TAKE 1 TABLET BY MOUTH EVERY 4 HOURS AS NEEDED FOR HEADACHE 14 tablet 3   calcium  carbonate (OSCAL) 1500 (600 Ca) MG TABS tablet Take 1,500 mg by mouth 2 (two) times daily with a meal.     clonazePAM  (KLONOPIN ) 1 MG tablet TAKE 1 TABLET(1 MG) BY MOUTH TWICE DAILY 60 tablet 2   dexamethasone  (DECADRON ) 4 MG tablet Take 5 tablets (20 mg total) by mouth 2 (two) times daily with a meal. 25 tablet 0   escitalopram  (LEXAPRO ) 10 MG tablet Take 1 tablet (10 mg total) by mouth daily. 30  tablet 2   gabapentin  (NEURONTIN ) 300 MG capsule Take 300 mg by mouth.     HYDROmorphone  (DILAUDID ) 4 MG tablet Take 1 tablet (4 mg total) by mouth every 4 (four) hours as needed for severe pain (pain score 7-10). 60 tablet 0   ipratropium-albuterol  (DUONEB) 0.5-2.5 (3) MG/3ML SOLN Take 3 mLs by nebulization 4 (four) times daily. 360 mL 0   lenalidomide  (REVLIMID ) 10 MG capsule Take 1 capsule every day for 3 weeks on and then hold for 1 week.  [employer]elgene- [id number]  Date obtained: 10/30/23 28 capsule 0   levothyroxine  (SYNTHROID ) 125 MCG tablet Take 2 tablets (250 mcg total) by mouth daily  before breakfast. 60 tablet 0   pantoprazole  (PROTONIX ) 40 MG tablet 30 min before lunch or dinner 90 tablet 3   potassium chloride  SA (KLOR-CON  M) 20 MEQ tablet Take 20 mEq by mouth 2 (two) times daily.     prochlorperazine  (COMPAZINE ) 10 MG tablet TAKE 1 TABLET(10 MG) BY MOUTH EVERY 6 HOURS AS NEEDED FOR NAUSEA OR VOMITING 30 tablet 2   Spacer/Aero-Holding Chambers (AEROCHAMBER MV) inhaler Use as instructed 1 each 0   Specialty Vitamins Products (MG PLUS PROTEIN) 133 MG TABS Take 1 tablet by mouth every morning.     thiamine  (VITAMIN B-1) 100 MG tablet Take 1 tablet (100 mg total) by mouth daily. 30 tablet 0   zolpidem  (AMBIEN ) 10 MG tablet TAKE 1/2 TO 1 TABLET(5 TO 10 MG) BY MOUTH AT BEDTIME AS NEEDED FOR SLEEP 30 tablet 2   No facility-administered medications prior to visit.

## 2023-11-06 NOTE — Telephone Encounter (Signed)
 HI ginna- not sure who is going to look into this. Any insights?

## 2023-11-11 ENCOUNTER — Inpatient Hospital Stay

## 2023-11-11 ENCOUNTER — Inpatient Hospital Stay (HOSPITAL_BASED_OUTPATIENT_CLINIC_OR_DEPARTMENT_OTHER): Admitting: Oncology

## 2023-11-11 ENCOUNTER — Encounter: Payer: Self-pay | Admitting: Oncology

## 2023-11-11 VITALS — BP 96/65 | HR 81 | Temp 98.6°F | Resp 19 | Ht 62.0 in | Wt 150.5 lb

## 2023-11-11 DIAGNOSIS — G893 Neoplasm related pain (acute) (chronic): Secondary | ICD-10-CM | POA: Diagnosis not present

## 2023-11-11 DIAGNOSIS — Z79899 Other long term (current) drug therapy: Secondary | ICD-10-CM | POA: Diagnosis not present

## 2023-11-11 DIAGNOSIS — C9000 Multiple myeloma not having achieved remission: Secondary | ICD-10-CM | POA: Diagnosis not present

## 2023-11-11 DIAGNOSIS — C9001 Multiple myeloma in remission: Secondary | ICD-10-CM | POA: Insufficient documentation

## 2023-11-11 DIAGNOSIS — Z5111 Encounter for antineoplastic chemotherapy: Secondary | ICD-10-CM

## 2023-11-11 LAB — CBC WITH DIFFERENTIAL (CANCER CENTER ONLY)
Abs Immature Granulocytes: 0.02 10*3/uL (ref 0.00–0.07)
Basophils Absolute: 0 10*3/uL (ref 0.0–0.1)
Basophils Relative: 0 %
Eosinophils Absolute: 0.1 10*3/uL (ref 0.0–0.5)
Eosinophils Relative: 2 %
HCT: 35.4 % — ABNORMAL LOW (ref 36.0–46.0)
Hemoglobin: 11.3 g/dL — ABNORMAL LOW (ref 12.0–15.0)
Immature Granulocytes: 0 %
Lymphocytes Relative: 46 %
Lymphs Abs: 2.8 10*3/uL (ref 0.7–4.0)
MCH: 29 pg (ref 26.0–34.0)
MCHC: 31.9 g/dL (ref 30.0–36.0)
MCV: 90.8 fL (ref 80.0–100.0)
Monocytes Absolute: 0.5 10*3/uL (ref 0.1–1.0)
Monocytes Relative: 7 %
Neutro Abs: 2.9 10*3/uL (ref 1.7–7.7)
Neutrophils Relative %: 45 %
Platelet Count: 191 10*3/uL (ref 150–400)
RBC: 3.9 MIL/uL (ref 3.87–5.11)
RDW: 14 % (ref 11.5–15.5)
WBC Count: 6.4 10*3/uL (ref 4.0–10.5)
nRBC: 0 % (ref 0.0–0.2)

## 2023-11-11 LAB — CMP (CANCER CENTER ONLY)
ALT: 12 U/L (ref 0–44)
AST: 12 U/L — ABNORMAL LOW (ref 15–41)
Albumin: 3.5 g/dL (ref 3.5–5.0)
Alkaline Phosphatase: 58 U/L (ref 38–126)
Anion gap: 8 (ref 5–15)
BUN: 9 mg/dL (ref 6–20)
CO2: 26 mmol/L (ref 22–32)
Calcium: 8.6 mg/dL — ABNORMAL LOW (ref 8.9–10.3)
Chloride: 102 mmol/L (ref 98–111)
Creatinine: 1.1 mg/dL — ABNORMAL HIGH (ref 0.44–1.00)
GFR, Estimated: 58 mL/min — ABNORMAL LOW (ref >60)
Glucose, Bld: 105 mg/dL — ABNORMAL HIGH (ref 70–99)
Potassium: 3.4 mmol/L — ABNORMAL LOW (ref 3.5–5.1)
Sodium: 136 mmol/L (ref 135–145)
Total Bilirubin: 0.5 mg/dL (ref 0.0–1.2)
Total Protein: 5.6 g/dL — ABNORMAL LOW (ref 6.5–8.1)

## 2023-11-11 MED ORDER — BORTEZOMIB CHEMO SQ INJECTION 3.5 MG (2.5MG/ML)
1.5000 mg/m2 | Freq: Once | INTRAMUSCULAR | Status: AC
  Administered 2023-11-11: 2.75 mg via SUBCUTANEOUS
  Filled 2023-11-11: qty 1.1

## 2023-11-11 MED ORDER — PROCHLORPERAZINE MALEATE 10 MG PO TABS
10.0000 mg | ORAL_TABLET | Freq: Once | ORAL | Status: AC
Start: 2023-11-11 — End: 2023-11-11
  Administered 2023-11-11: 10 mg via ORAL
  Filled 2023-11-11: qty 1

## 2023-11-11 NOTE — Progress Notes (Signed)
 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   She presented to the ER with symptoms of pleuritic chest wall pain and shortness of breath and underwent a CT angio chest which showed a soft tissue mass with bone destruction involving the left seventh rib as well as T6 and T7 vertebral bodies and central canal.  Possibility of cord compression.  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   Results of myeloma work-up from 06/20/2020 showed an elevated IgG of 3668.  M protein was elevated at 3.1 and IFE showed IgG monoclonal lambda protein.  Beta-2  microglobulin and LDH were normal.  Serum free light chain ratio was elevated at 25 with a free light chain lambda of 303.  Bone marrow biopsy showed increased plasma cells 22% by manual count and 20% by IHC staining for CD138.  Cytogenetics for myeloma showed gain of 1 q.   MRI cervical and lumbar thoracic spine  did not show any evidence of lytic lesions.  Bone survey was negative for bone lesions as well.  MRI pelvis with and without contrast showed diffuse patchy heterogeneous marrow concerning for myeloma    Patient had a repeat bone marrow biopsy inAugust 2024 which showed 20% overall plasma cells and hypercellular marrow consistent with plasma cell myeloma.  Cytogenetics normal and FISH studies did not show any abnormalities.  Patient had stabilization procedure/spinal fusion T4-T9 with T6-T7 laminotomies.  Spinal mass biopsy was consistent with plasma cell neoplasm. PET CT scan showed solitary hypermetabolic lytic lesion in the L4 vertebral body consistent with multiple myeloma.  Residual soft tissue density at the left paraspinal resection site T6-T7 with no significant metabolic activity.   Plan was to do outpatient daratumumab  Revlimid  Velcade  dexamethasone  regimen.  However patient was found to be in acute kidney failure with a creatinine of 5 in August 2024 and was sent to the ER.  She received cycle 1 of CyBorD in the hospital with improvement in her creatinine with stabilized between 1.5-1.7.  Treatment has been complicated by repeated hospitalizations for acute hypoxic respiratory failure and left pleural effusion which has been treated with antibiotics and thoracentesis.  Patient only received 1 dose of denosumab  in November 2024 and subsequently had to be held due to significant hypocalcemia.   Subsequently patient's clinical condition improved and she started D VRD regimen as per [facility] protocol on 03/31/2023.  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      Interval history-back pain fluctuates but she is still using PRN Dilaudid  consistently.  Last week has been better in terms of her pain.  ECOG PS- 0 Pain scale- 4 Opioid  associated constipation- no  Review of systems- Review of Systems  Constitutional:  Positive for malaise/fatigue. Negative for chills, fever and weight loss.  HENT:  Negative for congestion, ear discharge and nosebleeds.   Eyes:  Negative for blurred vision.  Respiratory:  Negative for cough, hemoptysis, sputum production, shortness of breath and wheezing.   Cardiovascular:  Negative for chest pain, palpitations, orthopnea and claudication.  Gastrointestinal:  Negative for abdominal pain, blood in stool, constipation, diarrhea, heartburn, melena, nausea and vomiting.  Genitourinary:  Negative for dysuria, flank pain, frequency, hematuria and urgency.  Musculoskeletal:  Positive for back pain. Negative for joint pain and myalgias.  Skin:  Negative for rash.  Neurological:  Negative for dizziness, tingling, focal weakness, seizures, weakness and headaches.  Endo/Heme/Allergies:  Does not bruise/bleed easily.  Psychiatric/Behavioral:  Negative for depression and suicidal ideas. The patient does not have insomnia.       Allergies  Allergen Reactions   Hctz [Hydrochlorothiazide ]     Hyponatremia      Past Medical History:  Diagnosis Date   Anxiety    Arthritis    Asthma    Barrett's esophagus 2015   COVID-19    03/10/20   Depression    GERD (gastroesophageal reflux disease)    Hypertension    Hypothyroidism    Pneumonia    Smoldering myeloma    Thyroid  disease      Past Surgical History:  Procedure Laterality Date   ABLATION  2006   APPLICATION OF INTRAOPERATIVE CT SCAN N/A 01/28/2023   Procedure: APPLICATION OF INTRAOPERATIVE CT SCAN;  Surgeon: Aurther Blue, MD;  Location: [facility] ORS;  Service: Neurosurgery;  Laterality: N/A;   BREAST CYST ASPIRATION Left 1998   BREAST SURGERY     cyst aspiration   BREAST SURGERY  1998   cyst aspiration   COLONOSCOPY  03/2014   COLONOSCOPY WITH PROPOFOL  N/A 01/06/2020   Procedure: COLONOSCOPY WITH PROPOFOL ;  Surgeon: Toledo,  Alphonsus Jeans, MD;  Location: [facility] ENDOSCOPY;  Service: Gastroenterology;  Laterality: N/A;   ENDOMETRIAL ABLATION     ESOPHAGOGASTRODUODENOSCOPY (EGD) WITH PROPOFOL  N/A 08/15/2015   Procedure: ESOPHAGOGASTRODUODENOSCOPY (EGD) WITH PROPOFOL ;  Surgeon: Deveron Fly, MD;  Location: [facility] ENDOSCOPY;  Service: Endoscopy;  Laterality: N/A;   ESOPHAGOGASTRODUODENOSCOPY (EGD) WITH PROPOFOL  N/A 01/22/2016   Procedure: ESOPHAGOGASTRODUODENOSCOPY (EGD) WITH PROPOFOL ;  Surgeon: Deveron Fly, MD;  Location: [facility] ENDOSCOPY;  Service: Endoscopy;  Laterality: N/A;   ESOPHAGOGASTRODUODENOSCOPY (EGD) WITH PROPOFOL  N/A 01/06/2020   Procedure: ESOPHAGOGASTRODUODENOSCOPY (EGD) WITH PROPOFOL ;  Surgeon: Toledo, Alphonsus Jeans, MD;  Location: [facility] ENDOSCOPY;  Service: Gastroenterology;  Laterality: N/A;   FLEXIBLE BRONCHOSCOPY Bilateral 03/13/2023   Procedure: FLEXIBLE BRONCHOSCOPY;  Surgeon: Cleve Dale, MD;  Location: [facility] ORS;  Service: Cardiopulmonary;  Laterality: Bilateral;   GASTRIC BYPASS  2005   HERNIA REPAIR     IR BONE MARROW BIOPSY & ASPIRATION  02/05/2023   NASAL SINUS SURGERY     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   TOTAL THYROIDECTOMY     TUBAL LIGATION      Social History   Socioeconomic History   Marital status: Divorced    Spouse name: Not on file   Number of children: 2  Years of education: Not on file   Highest education level: GED or equivalent  Occupational History   Occupation: Part-time  Tobacco Use   Smoking status: Former    Types: Cigarettes   Smokeless tobacco: Never   Tobacco comments:    Quit smoking 02/16/2023  Vaping Use   Vaping status: Never Used  Substance and Sexual Activity   Alcohol use: Yes    Alcohol/week: 10.0 - 12.0 standard drinks of alcohol    Types: 8 - 10 Glasses of wine, 2 Standard drinks or equivalent per week   Drug use: No   Sexual activity: Yes    Partners: Male  Other Topics Concern   Not on file  Social History Narrative   Lives in  [place] single, divorced       Work - 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       Diet - healthy diet   Exercise - limited   Caffeine  use: daily   Social Drivers of Corporate investment banker Strain: Low Risk  (11/01/2022)   Overall Financial Resource Strain (CARDIA)    Difficulty of Paying Living Expenses: Not very hard  Food Insecurity: No Food Insecurity (08/26/2023)   Hunger Vital Sign    Worried About Running Out of Food in the Last Year: Never true    Ran Out of Food in the Last Year: Never true  Transportation Needs: No Transportation Needs (08/26/2023)   PRAPARE - Administrator, Civil Service (Medical): No    Lack of Transportation (Non-Medical): No  Physical Activity: Insufficiently Active (11/01/2022)   Exercise Vital Sign    Days of Exercise per Week: 3 days    Minutes of Exercise per Session: 20 min  Stress: Stress Concern Present (03/04/2023)   [employer] of Occupational Health - Occupational Stress Questionnaire    Feeling of Stress : Very much  Social Connections: Socially Isolated (03/04/2023)   Social Connection and Isolation Panel [NHANES]    Frequency of Communication with Friends and Family: Never    Frequency of Social Gatherings with Friends and Family: Never    Attends Religious Services: Never    Database administrator or Organizations: No    Attends Banker Meetings: Never    Marital Status: Divorced  Catering manager Violence: Not At Risk (08/26/2023)   Humiliation, Afraid, Rape, and Kick questionnaire    Fear of Current or Ex-Partner: No    Emotionally Abused: No    Physically Abused: No    Sexually Abused: No    Family History  Problem Relation Age of Onset   Heart disease Mother    COPD Mother    Arthritis Mother    Cancer Mother        uterine   Lupus Mother    Anxiety disorder Mother    Depression Mother    Drug abuse Mother    COPD Father    Arthritis Father    Heart disease Father    Heart attack  Father    Alcohol abuse Father    Heart disease Brother    Heart attack Brother    Drug abuse Brother    Anxiety disorder Brother    Depression Brother    Heart disease Brother    Cancer Brother        liver cancer age 38 y.o   Diabetes Maternal Grandmother    Diabetes Maternal Grandfather    Diabetes Paternal Grandmother    Diabetes Paternal Actor  Breast cancer Neg Hx      Current Outpatient Medications:    acyclovir  (ZOVIRAX ) 400 MG tablet, Take 1 tablet (400 mg total) by mouth 2 (two) times daily., Disp: 60 tablet, Rfl: 5   aspirin  EC 81 MG tablet, Take 1 tablet (81 mg total) by mouth daily., Disp: 100 tablet, Rfl: 3   butalbital -acetaminophen -caffeine  (FIORICET ) 50-325-40 MG tablet, TAKE 1 TABLET BY MOUTH EVERY 4 HOURS AS NEEDED FOR HEADACHE, Disp: 14 tablet, Rfl: 3   calcium  carbonate (OSCAL) 1500 (600 Ca) MG TABS tablet, Take 1,500 mg by mouth 2 (two) times daily with a meal., Disp: , Rfl:    clonazePAM  (KLONOPIN ) 1 MG tablet, TAKE 1 TABLET(1 MG) BY MOUTH TWICE DAILY, Disp: 60 tablet, Rfl: 2   dexamethasone  (DECADRON ) 4 MG tablet, Take 5 tablets (20 mg total) by mouth 2 (two) times daily with a meal., Disp: 25 tablet, Rfl: 0   escitalopram  (LEXAPRO ) 10 MG tablet, Take 1 tablet (10 mg total) by mouth daily., Disp: 30 tablet, Rfl: 2   gabapentin  (NEURONTIN ) 300 MG capsule, Take 300 mg by mouth., Disp: , Rfl:    HYDROmorphone  (DILAUDID ) 4 MG tablet, Take 1 tablet (4 mg total) by mouth every 4 (four) hours as needed for severe pain (pain score 7-10)., Disp: 60 tablet, Rfl: 0   ipratropium-albuterol  (DUONEB) 0.5-2.5 (3) MG/3ML SOLN, Take 3 mLs by nebulization 4 (four) times daily., Disp: 360 mL, Rfl: 0   lenalidomide  (REVLIMID ) 10 MG capsule, Take 1 capsule every day for 3 weeks on and then hold for 1 week.  [employer]- [id number]  Date obtained: 10/30/23, Disp: 28 capsule, Rfl: 0   levothyroxine  (SYNTHROID ) 125 MCG tablet, Take 2 tablets (250 mcg total) by mouth daily before  breakfast., Disp: 60 tablet, Rfl: 0   pantoprazole  (PROTONIX ) 40 MG tablet, 30 min before lunch or dinner, Disp: 90 tablet, Rfl: 3   potassium chloride  SA (KLOR-CON  M) 20 MEQ tablet, Take 20 mEq by mouth 2 (two) times daily., Disp: , Rfl:    prochlorperazine  (COMPAZINE ) 10 MG tablet, TAKE 1 TABLET(10 MG) BY MOUTH EVERY 6 HOURS AS NEEDED FOR NAUSEA OR VOMITING, Disp: 30 tablet, Rfl: 2   Spacer/Aero-Holding Chambers (AEROCHAMBER MV) inhaler, Use as instructed, Disp: 1 each, Rfl: 0   Specialty Vitamins Products (MG PLUS PROTEIN) 133 MG TABS, Take 1 tablet by mouth every morning., Disp: , Rfl:    thiamine  (VITAMIN B-1) 100 MG tablet, Take 1 tablet (100 mg total) by mouth daily., Disp: 30 tablet, Rfl: 0   zolpidem  (AMBIEN ) 10 MG tablet, TAKE 1/2 TO 1 TABLET(5 TO 10 MG) BY MOUTH AT BEDTIME AS NEEDED FOR SLEEP, Disp: 30 tablet, Rfl: 2  Physical exam:  Vitals:   11/11/23 0907  BP: 96/65  Pulse: 81  Resp: 19  Temp: 98.6 F (37 C)  TempSrc: Tympanic  SpO2: 94%  Weight: 150 lb 8 oz (68.3 kg)  Height: 5\' 2"  (1.575 m)   Physical Exam Cardiovascular:     Rate and Rhythm: Normal rate and regular rhythm.     Heart sounds: Normal heart sounds.  Pulmonary:     Effort: Pulmonary effort is normal.     Breath sounds: Normal breath sounds.  Abdominal:     General: Bowel sounds are normal.     Palpations: Abdomen is soft.  Skin:    General: Skin is warm and dry.  Neurological:     Mental Status: She is alert and oriented to person, place, and time.  I have personally reviewed labs listed below:    Latest Ref Rng & Units 11/11/2023    8:48 AM  CMP  Glucose 70 - 99 mg/dL 098   BUN 6 - 20 mg/dL 9   Creatinine 1.19 - 1.47 mg/dL 8.29   Sodium 562 - 130 mmol/L 136   Potassium 3.5 - 5.1 mmol/L 3.4   Chloride 98 - 111 mmol/L 102   CO2 22 - 32 mmol/L 26   Calcium  8.9 - 10.3 mg/dL 8.6   Total Protein 6.5 - 8.1 g/dL 5.6   Total Bilirubin 0.0 - 1.2 mg/dL 0.5   Alkaline Phos 38 - 126 U/L 58    AST 15 - 41 U/L 12   ALT 0 - 44 U/L 12       Latest Ref Rng & Units 11/11/2023    8:48 AM  CBC  WBC 4.0 - 10.5 K/uL 6.4   Hemoglobin 12.0 - 15.0 g/dL 86.5   Hematocrit 78.4 - 46.0 % 35.4   Platelets 150 - 400 K/uL 191    I have personally reviewed Radiology images listed below: No images are attached to the encounter.  DG Thoracic Spine 2 View Result Date: 10/29/2023 CLINICAL DATA:  Status post surgical posterior fusion of thoracic spine. EXAM: THORACIC SPINE 2 VIEWS COMPARISON:  April 21, 2023. FINDINGS: Status post surgical posterior fusion extending from T4-T9 bilateral intrapedicular screw placement. Moderate thoracic kyphosis remains. No acute fracture or spondylolisthesis is noted. IMPRESSION: Stable postsurgical changes as noted above. Electronically Signed   By: Rosalene Colon M.D.   On: 10/29/2023 15:27     Assessment and plan- Patient is a 58 y.o. female with history of standard risk R-ISS stage II IgG lambda multiple myeloma s/p induction D VRD regimen followed by autoLog a stem cell transplantation she is here for on treatment assessment prior to cycle 6-day 8 of Velcade ..  Counts okay to proceed with cycle 6-day 8 of Velcade  today.  I will plan to start maintenance phase as per [facility] protocol in 2 weeks time when she will be receiving Darzalex  every 28 days and will be on Revlimid  10 mg 3 weeks on and 1 week off.  If myeloma labs continue to look stable in about 3 to 4 months we will plan to drop her Darzalex  altogether.  Due to her ongoing back pain we have obtained CT thoracic and lumbar spine the results of which are pending.  Pain is presently well-controlled with as needed Dilaudid .  Patient will continue with aspirin  and acyclovir  prophylaxis.  Denosumab  has been on hold given that patient developed significant hypocalcemia following 1 dose   Visit Diagnosis 1. Encounter for antineoplastic chemotherapy   2. High risk medication use   3. Neoplasm related pain    4. Multiple myeloma in remission [facility])      Dr. Seretha Dance, MD, MPH [facility] [phone] 11/11/2023 10:48 AM

## 2023-11-13 ENCOUNTER — Ambulatory Visit: Payer: Self-pay | Admitting: Oncology

## 2023-11-13 NOTE — Telephone Encounter (Signed)
 Per Dr. Randy Buttery "Please let her know that her CT does not show any new findings. Old fractures and changes of myeloma and hardware from surgery looks stable".  Outbound call to patient; informed of above.  Advised to contact clinic for any questions / concerns.

## 2023-11-13 NOTE — Telephone Encounter (Signed)
-----   Message from Avonne Boettcher sent at 11/13/2023  9:44 AM EDT ----- Please let her know that her CT does not show any new findings. Old fractures and changes of myeloma and hardware from surgery looks stable

## 2023-11-15 DIAGNOSIS — F411 Generalized anxiety disorder: Secondary | ICD-10-CM | POA: Diagnosis not present

## 2023-11-15 DIAGNOSIS — F17200 Nicotine dependence, unspecified, uncomplicated: Secondary | ICD-10-CM | POA: Diagnosis not present

## 2023-11-15 DIAGNOSIS — C9000 Multiple myeloma not having achieved remission: Secondary | ICD-10-CM | POA: Diagnosis not present

## 2023-11-18 NOTE — Progress Notes (Signed)
 Pharmacist Chemotherapy Monitoring - Initial Assessment    Anticipated start date: 6/17   The following has been reviewed per standard work regarding the patient's treatment regimen: The patient's diagnosis, treatment plan and drug doses, and organ/hematologic function Lab orders and baseline tests specific to treatment regimen  The treatment plan start date, drug sequencing, and pre-medications Prior authorization status  Patient's documented medication list, including drug-drug interaction screen and prescriptions for anti-emetics and supportive care specific to the treatment regimen The drug concentrations, fluid compatibility, administration routes, and timing of the medications to be used The patient's access for treatment and lifetime cumulative dose history, if applicable  The patient's medication allergies and previous infusion related reactions, if applicable   Plan to start maintenance phase as per [facility] protocol in 2 weeks time when she will be receiving Darzalex  every 28 days and will be on Revlimid  10 mg 3 weeks on and 1 week off. If myeloma labs continue to look stable in about 3 to 4 months we will plan to drop her Darzalex  altogether.   Follow up needed:     Emaline Handsome, [facility], 11/18/2023  12:45 PM

## 2023-11-20 ENCOUNTER — Other Ambulatory Visit: Payer: Self-pay

## 2023-11-21 ENCOUNTER — Encounter: Payer: Self-pay | Admitting: Oncology

## 2023-11-21 MED ORDER — HYDROMORPHONE HCL 4 MG PO TABS: 4.0000 mg | ORAL_TABLET | ORAL | 0 refills | Status: DC | PRN

## 2023-11-25 ENCOUNTER — Encounter: Payer: Self-pay | Admitting: Oncology

## 2023-11-26 ENCOUNTER — Encounter: Payer: Self-pay | Admitting: Oncology

## 2023-11-27 ENCOUNTER — Other Ambulatory Visit: Payer: Self-pay | Admitting: Oncology

## 2023-11-27 DIAGNOSIS — C9000 Multiple myeloma not having achieved remission: Secondary | ICD-10-CM

## 2023-11-28 ENCOUNTER — Other Ambulatory Visit: Payer: Self-pay

## 2023-11-28 ENCOUNTER — Encounter: Payer: Self-pay | Admitting: Oncology

## 2023-11-28 NOTE — Telephone Encounter (Signed)
Can you please look into this?

## 2023-12-02 ENCOUNTER — Inpatient Hospital Stay: Attending: Oncology

## 2023-12-02 ENCOUNTER — Encounter: Payer: Self-pay | Admitting: Oncology

## 2023-12-02 ENCOUNTER — Inpatient Hospital Stay

## 2023-12-02 ENCOUNTER — Inpatient Hospital Stay: Admitting: Oncology

## 2023-12-02 VITALS — BP 112/74 | HR 69 | Temp 98.3°F | Resp 19 | Wt 150.8 lb

## 2023-12-02 DIAGNOSIS — R5383 Other fatigue: Secondary | ICD-10-CM | POA: Diagnosis not present

## 2023-12-02 DIAGNOSIS — C9001 Multiple myeloma in remission: Secondary | ICD-10-CM

## 2023-12-02 DIAGNOSIS — Z87891 Personal history of nicotine dependence: Secondary | ICD-10-CM | POA: Diagnosis not present

## 2023-12-02 DIAGNOSIS — Z79891 Long term (current) use of opiate analgesic: Secondary | ICD-10-CM | POA: Diagnosis not present

## 2023-12-02 DIAGNOSIS — Z800 Family history of malignant neoplasm of digestive organs: Secondary | ICD-10-CM | POA: Diagnosis not present

## 2023-12-02 DIAGNOSIS — Z8049 Family history of malignant neoplasm of other genital organs: Secondary | ICD-10-CM | POA: Insufficient documentation

## 2023-12-02 DIAGNOSIS — C9000 Multiple myeloma not having achieved remission: Secondary | ICD-10-CM | POA: Insufficient documentation

## 2023-12-02 DIAGNOSIS — Z9484 Stem cells transplant status: Secondary | ICD-10-CM | POA: Diagnosis not present

## 2023-12-02 DIAGNOSIS — Z5111 Encounter for antineoplastic chemotherapy: Secondary | ICD-10-CM

## 2023-12-02 DIAGNOSIS — Z7952 Long term (current) use of systemic steroids: Secondary | ICD-10-CM | POA: Diagnosis not present

## 2023-12-02 DIAGNOSIS — G893 Neoplasm related pain (acute) (chronic): Secondary | ICD-10-CM | POA: Insufficient documentation

## 2023-12-02 DIAGNOSIS — Z79899 Other long term (current) drug therapy: Secondary | ICD-10-CM | POA: Diagnosis not present

## 2023-12-02 DIAGNOSIS — Z8719 Personal history of other diseases of the digestive system: Secondary | ICD-10-CM | POA: Diagnosis not present

## 2023-12-02 LAB — CBC WITH DIFFERENTIAL (CANCER CENTER ONLY)
Abs Immature Granulocytes: 0.01 10*3/uL (ref 0.00–0.07)
Basophils Absolute: 0.1 10*3/uL (ref 0.0–0.1)
Basophils Relative: 1 %
Eosinophils Absolute: 0.1 10*3/uL (ref 0.0–0.5)
Eosinophils Relative: 3 %
HCT: 32 % — ABNORMAL LOW (ref 36.0–46.0)
Hemoglobin: 10.5 g/dL — ABNORMAL LOW (ref 12.0–15.0)
Immature Granulocytes: 0 %
Lymphocytes Relative: 60 %
Lymphs Abs: 3 10*3/uL (ref 0.7–4.0)
MCH: 29.7 pg (ref 26.0–34.0)
MCHC: 32.8 g/dL (ref 30.0–36.0)
MCV: 90.7 fL (ref 80.0–100.0)
Monocytes Absolute: 0.2 10*3/uL (ref 0.1–1.0)
Monocytes Relative: 4 %
Neutro Abs: 1.6 10*3/uL — ABNORMAL LOW (ref 1.7–7.7)
Neutrophils Relative %: 32 %
Platelet Count: 254 10*3/uL (ref 150–400)
RBC: 3.53 MIL/uL — ABNORMAL LOW (ref 3.87–5.11)
RDW: 13.4 % (ref 11.5–15.5)
WBC Count: 5 10*3/uL (ref 4.0–10.5)
nRBC: 0 % (ref 0.0–0.2)

## 2023-12-02 MED ORDER — DIPHENHYDRAMINE HCL 25 MG PO CAPS
50.0000 mg | ORAL_CAPSULE | Freq: Once | ORAL | Status: AC
  Administered 2023-12-02: 50 mg via ORAL
  Filled 2023-12-02: qty 2

## 2023-12-02 MED ORDER — ACETAMINOPHEN 325 MG PO TABS
650.0000 mg | ORAL_TABLET | Freq: Once | ORAL | Status: AC
  Administered 2023-12-02: 650 mg via ORAL
  Filled 2023-12-02: qty 2

## 2023-12-02 MED ORDER — DEXAMETHASONE 4 MG PO TABS
20.0000 mg | ORAL_TABLET | Freq: Once | ORAL | Status: AC
  Administered 2023-12-02: 20 mg via ORAL
  Filled 2023-12-02: qty 5

## 2023-12-02 MED ORDER — MONTELUKAST SODIUM 10 MG PO TABS
10.0000 mg | ORAL_TABLET | Freq: Once | ORAL | Status: AC
  Administered 2023-12-02: 10 mg via ORAL
  Filled 2023-12-02: qty 1

## 2023-12-02 MED ORDER — DARATUMUMAB-HYALURONIDASE-FIHJ 1800-30000 MG-UT/15ML ~~LOC~~ SOLN
1800.0000 mg | Freq: Once | SUBCUTANEOUS | Status: AC
  Administered 2023-12-02: 1800 mg via SUBCUTANEOUS
  Filled 2023-12-02: qty 15

## 2023-12-02 NOTE — Progress Notes (Signed)
 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   She presented to the ER with symptoms of pleuritic chest wall pain and shortness of breath and underwent a CT angio chest which showed a soft tissue mass with bone destruction involving the left seventh rib as well as T6 and T7 vertebral bodies and central canal.  Possibility of cord compression.  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   Results of myeloma work-up from 06/20/2020 showed an elevated IgG of 3668.  M protein was elevated at 3.1 and IFE showed IgG monoclonal lambda protein.  Beta-2  microglobulin and LDH were normal.  Serum free light chain ratio was elevated at 25 with a free light chain lambda of 303.  Bone marrow biopsy showed increased plasma cells 22% by manual count and 20% by IHC staining for CD138.  Cytogenetics for myeloma showed gain of 1 q.   MRI cervical and lumbar thoracic  spine did not show any evidence of lytic lesions.  Bone survey was negative for bone lesions as well.  MRI pelvis with and without contrast showed diffuse patchy heterogeneous marrow concerning for myeloma    Patient had a repeat bone marrow biopsy inAugust 2024 which showed 20% overall plasma cells and hypercellular marrow consistent with plasma cell myeloma.  Cytogenetics normal and FISH studies did not show any abnormalities.  Patient had stabilization procedure/spinal fusion T4-T9 with T6-T7 laminotomies.  Spinal mass biopsy was consistent with plasma cell neoplasm. PET CT scan showed solitary hypermetabolic lytic lesion in the L4 vertebral body consistent with multiple myeloma.  Residual soft tissue density at the left paraspinal resection site T6-T7 with no significant metabolic activity.   Plan was to do outpatient daratumumab  Revlimid  Velcade  dexamethasone  regimen.  However patient was found to be in acute kidney failure with a creatinine of 5 in August 2024 and was sent to the ER.  She received cycle 1 of CyBorD in the hospital with improvement in her creatinine with stabilized between 1.5-1.7.  Treatment has been complicated by repeated hospitalizations for acute hypoxic respiratory failure and left pleural effusion which has been treated with antibiotics and thoracentesis.  Patient only received 1 dose of denosumab  in November 2024 and subsequently had to be held due to significant hypocalcemia.   Subsequently patient's clinical condition improved and she started D VRD regimen as per [facility] Seed protocol on 03/31/2023.  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      Interval history-patient has waxing and waning back pain but presently reports that her pain medications are working well for her and she has not needed to use them as  much in the last 1 week.  She was away for graduation ceremony in her family last week when she developed an episode of worsening shortness of breath and was diagnosed with RSV.  That is getting better as well.  ECOG PS- 1 Pain scale- 3 Opioid associated constipation- no  Review of systems- Review of Systems  Constitutional:  Positive for malaise/fatigue. Negative for chills, fever and weight loss.  HENT:  Negative for congestion, ear discharge and nosebleeds.   Eyes:  Negative for blurred vision.  Respiratory:  Negative for cough, hemoptysis, sputum production, shortness of breath and wheezing.   Cardiovascular:  Negative for chest pain, palpitations, orthopnea and claudication.  Gastrointestinal:  Negative for abdominal pain, blood in stool, constipation, diarrhea, heartburn, melena, nausea and vomiting.  Genitourinary:  Negative for dysuria, flank pain, frequency, hematuria and urgency.  Musculoskeletal:  Positive for back pain. Negative for joint pain and myalgias.  Skin:  Negative for rash.  Neurological:  Negative for dizziness, tingling, focal weakness, seizures, weakness and headaches.  Endo/Heme/Allergies:  Does not bruise/bleed easily.  Psychiatric/Behavioral:  Negative for depression and suicidal ideas. The patient does not have insomnia.       Allergies  Allergen Reactions   Hctz [Hydrochlorothiazide ]     Hyponatremia      Past Medical History:  Diagnosis Date   Anxiety    Arthritis    Asthma    Barrett's esophagus 2015   COVID-19    03/10/20   Depression    GERD (gastroesophageal reflux disease)    Hypertension    Hypothyroidism    Pneumonia    Smoldering myeloma    Thyroid  disease      Past Surgical History:  Procedure Laterality Date   ABLATION  2006   APPLICATION OF INTRAOPERATIVE CT SCAN N/A 01/28/2023   Procedure: APPLICATION OF INTRAOPERATIVE CT SCAN;  Surgeon: Aurther Blue, MD;  Location: [facility] ORS;  Service: Neurosurgery;  Laterality: N/A;    BREAST CYST ASPIRATION Left 1998   BREAST SURGERY     cyst aspiration   BREAST SURGERY  1998   cyst aspiration   COLONOSCOPY  03/2014   COLONOSCOPY WITH PROPOFOL  N/A 01/06/2020   Procedure: COLONOSCOPY WITH PROPOFOL ;  Surgeon: Toledo, Alphonsus Jeans, MD;  Location: [facility] ENDOSCOPY;  Service: Gastroenterology;  Laterality: N/A;   ENDOMETRIAL ABLATION     ESOPHAGOGASTRODUODENOSCOPY (EGD) WITH PROPOFOL  N/A 08/15/2015   Procedure: ESOPHAGOGASTRODUODENOSCOPY (EGD) WITH PROPOFOL ;  Surgeon: Deveron Fly, MD;  Location: [facility] ENDOSCOPY;  Service: Endoscopy;  Laterality: N/A;   ESOPHAGOGASTRODUODENOSCOPY (EGD) WITH PROPOFOL  N/A 01/22/2016   Procedure: ESOPHAGOGASTRODUODENOSCOPY (EGD) WITH PROPOFOL ;  Surgeon: Deveron Fly, MD;  Location: [facility] ENDOSCOPY;  Service: Endoscopy;  Laterality: N/A;   ESOPHAGOGASTRODUODENOSCOPY (EGD) WITH PROPOFOL  N/A 01/06/2020   Procedure: ESOPHAGOGASTRODUODENOSCOPY (EGD) WITH PROPOFOL ;  Surgeon: Toledo, Alphonsus Jeans, MD;  Location: [facility] ENDOSCOPY;  Service: Gastroenterology;  Laterality: N/A;   FLEXIBLE BRONCHOSCOPY Bilateral 03/13/2023   Procedure: FLEXIBLE BRONCHOSCOPY;  Surgeon: Cleve Dale, MD;  Location: [facility] ORS;  Service: Cardiopulmonary;  Laterality: Bilateral;   GASTRIC BYPASS  2005   HERNIA REPAIR     IR BONE MARROW BIOPSY & ASPIRATION  02/05/2023   NASAL SINUS SURGERY     partial amputation Left  left 5th toe   TOTAL THYROIDECTOMY     TUBAL LIGATION      Social History   Socioeconomic History   Marital status: Divorced    Spouse name: Not on file   Number of children: 2   Years of education: Not on file   Highest education level: GED or equivalent  Occupational History   Occupation: Part-time  Tobacco Use   Smoking status: Former    Types: Cigarettes   Smokeless tobacco: Never   Tobacco comments:    Quit smoking 02/16/2023  Vaping Use   Vaping status: Never Used  Substance and Sexual Activity   Alcohol use: Yes    Alcohol/week: 10.0 -  12.0 standard drinks of alcohol    Types: 8 - 10 Glasses of wine, 2 Standard drinks or equivalent per week   Drug use: No   Sexual activity: Yes    Partners: Male  Other Topics Concern   Not on file  Social History Narrative   Lives in [place] single, divorced       Work - 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       Diet - healthy diet   Exercise - limited   Caffeine  use: daily   Social Drivers of Corporate investment banker Strain: Low Risk  (11/01/2022)   Overall Financial Resource Strain (CARDIA)    Difficulty of Paying Living Expenses: Not very hard  Food Insecurity: No Food Insecurity (08/26/2023)   Hunger Vital Sign    Worried About Running Out of Food in the Last Year: Never true    Ran Out of Food in the Last Year: Never true  Transportation Needs: No Transportation Needs (08/26/2023)   PRAPARE - Administrator, Civil Service (Medical): No    Lack of Transportation (Non-Medical): No  Physical Activity: Insufficiently Active (11/01/2022)   Exercise Vital Sign    Days of Exercise per Week: 3 days    Minutes of Exercise per Session: 20 min  Stress: Stress Concern Present (03/04/2023)   [employer] of Occupational Health - Occupational Stress Questionnaire    Feeling of Stress : Very much  Social Connections: Socially Isolated (03/04/2023)   Social Connection and Isolation Panel    Frequency of Communication with Friends and Family: Never    Frequency of Social Gatherings with Friends and Family: Never    Attends Religious Services: Never    Database administrator or Organizations: No    Attends Banker Meetings: Never    Marital Status: Divorced  Catering manager Violence: Not At Risk (08/26/2023)   Humiliation, Afraid, Rape, and Kick questionnaire    Fear of Current or Ex-Partner: No    Emotionally Abused: No    Physically Abused: No    Sexually Abused: No    Family History  Problem Relation Age of Onset   Heart disease Mother     COPD Mother    Arthritis Mother    Cancer Mother        uterine   Lupus Mother    Anxiety disorder Mother    Depression Mother    Drug abuse Mother    COPD Father    Arthritis Father    Heart disease Father    Heart attack Father    Alcohol abuse Father    Heart disease Brother    Heart attack Brother    Drug abuse Brother    Anxiety disorder Brother    Depression Brother  Heart disease Brother    Cancer Brother        liver cancer age 76 y.o   Diabetes Maternal Grandmother    Diabetes Maternal Grandfather    Diabetes Paternal Grandmother    Diabetes Paternal Grandfather    Breast cancer Neg Hx      Current Outpatient Medications:    butalbital -acetaminophen -caffeine  (FIORICET ) 50-325-40 MG tablet, TAKE 1 TABLET BY MOUTH EVERY 4 HOURS AS NEEDED FOR HEADACHE, Disp: 14 tablet, Rfl: 3   calcium  carbonate (OSCAL) 1500 (600 Ca) MG TABS tablet, Take 1,500 mg by mouth 2 (two) times daily with a meal., Disp: , Rfl:    clonazePAM  (KLONOPIN ) 1 MG tablet, TAKE 1 TABLET(1 MG) BY MOUTH TWICE DAILY, Disp: 60 tablet, Rfl: 2   dexamethasone  (DECADRON ) 4 MG tablet, Take 5 tablets (20 mg total) by mouth 2 (two) times daily with a meal., Disp: 25 tablet, Rfl: 0   escitalopram  (LEXAPRO ) 10 MG tablet, Take 1 tablet (10 mg total) by mouth daily., Disp: 30 tablet, Rfl: 2   gabapentin  (NEURONTIN ) 300 MG capsule, Take 300 mg by mouth., Disp: , Rfl:    HYDROmorphone  (DILAUDID ) 4 MG tablet, Take 1 tablet (4 mg total) by mouth every 4 (four) hours as needed for severe pain (pain score 7-10)., Disp: 60 tablet, Rfl: 0   ipratropium-albuterol  (DUONEB) 0.5-2.5 (3) MG/3ML SOLN, Take 3 mLs by nebulization 4 (four) times daily., Disp: 360 mL, Rfl: 0   lenalidomide  (REVLIMID ) 10 MG capsule, TAKE 1 CAPSULE DAILY FOR 3 WEEKS ON AND THEN HOLD FOR 1 WEEK, Disp: 21 capsule, Rfl: 0   levothyroxine  (SYNTHROID ) 125 MCG tablet, Take 2 tablets (250 mcg total) by mouth daily before breakfast., Disp: 60 tablet, Rfl: 0    pantoprazole  (PROTONIX ) 40 MG tablet, 30 min before lunch or dinner, Disp: 90 tablet, Rfl: 3   potassium chloride  SA (KLOR-CON  M) 20 MEQ tablet, Take 20 mEq by mouth 2 (two) times daily., Disp: , Rfl:    Spacer/Aero-Holding Chambers (AEROCHAMBER MV) inhaler, Use as instructed, Disp: 1 each, Rfl: 0   Specialty Vitamins Products (MG PLUS PROTEIN) 133 MG TABS, Take 1 tablet by mouth every morning., Disp: , Rfl:    thiamine  (VITAMIN B-1) 100 MG tablet, Take 1 tablet (100 mg total) by mouth daily., Disp: 30 tablet, Rfl: 0   zolpidem  (AMBIEN ) 10 MG tablet, TAKE 1/2 TO 1 TABLET(5 TO 10 MG) BY MOUTH AT BEDTIME AS NEEDED FOR SLEEP, Disp: 30 tablet, Rfl: 2  Physical exam: There were no vitals filed for this visit. Physical Exam  Cardiovascular:     Rate and Rhythm: Normal rate and regular rhythm.     Heart sounds: Normal heart sounds.  Pulmonary:     Effort: Pulmonary effort is normal.     Breath sounds: Normal breath sounds.  Abdominal:     General: Bowel sounds are normal.   Skin:    General: Skin is warm and dry.   Neurological:     Mental Status: She is alert and oriented to person, place, and time.      I have personally reviewed labs listed below:    Latest Ref Rng & Units 11/11/2023    8:48 AM  CMP  Glucose 70 - 99 mg/dL 332   BUN 6 - 20 mg/dL 9   Creatinine 9.51 - 8.84 mg/dL 1.66   Sodium 063 - 016 mmol/L 136   Potassium 3.5 - 5.1 mmol/L 3.4   Chloride 98 - 111 mmol/L 102  CO2 22 - 32 mmol/L 26   Calcium  8.9 - 10.3 mg/dL 8.6   Total Protein 6.5 - 8.1 g/dL 5.6   Total Bilirubin 0.0 - 1.2 mg/dL 0.5   Alkaline Phos 38 - 126 U/L 58   AST 15 - 41 U/L 12   ALT 0 - 44 U/L 12       Latest Ref Rng & Units 11/11/2023    8:48 AM  CBC  WBC 4.0 - 10.5 K/uL 6.4   Hemoglobin 12.0 - 15.0 g/dL 16.1   Hematocrit 09.6 - 46.0 % 35.4   Platelets 150 - 400 K/uL 191    I have personally reviewed Radiology images listed below: No images are attached to the encounter.  CT LUMBAR SPINE W  WO CONTRAST Result Date: 11/11/2023 EXAM: CT OF THE LUMBAR SPINE WITH AND WITHOUT CONTRAST 11/04/2023 02:47:35 PM TECHNIQUE: CT of the lumbar spine was performed with and without the administration of intravenous contrast. Multiplanar reformatted images are provided for review. Automated exposure control, iterative reconstruction, and/or weight based adjustment of the mA/kV was utilized to reduce the radiation dose to as low as reasonably achievable. COMPARISON: MRI of the lumbar spine 07/20/2020. CLINICAL HISTORY: Worsening back pain. Patient with history of multiple myeloma and recent bone marrow transplant. FINDINGS: BONES AND ALIGNMENT: There is normal alignment of the spine. The vertebral body heights are maintained. No pathologic fractures are present. Lytic lesion in L4 at the right midline measures 1.9 x 1.9 x 1.7 cm. An 8 mm lesion is present anteriorly on the right at L2. A lytic lesion along the superior aspect of S1 on the left measures 2.2 x 2.2 x 1.1 cm. DEGENERATIVE CHANGES: Leftward disc protrusion has progressed at L3-4. Mild foraminal narrowing is worse on the left. A leftward disc protrusion has progressed at L4-5. Moderate left and mild right foraminal narrowing is present. Mild disc extrusion is present at L5 to S1 without focal stenosis. SOFT TISSUES: No paraspinal hematoma. LIMITED RETROPERITONEUM: Limited images of the retroperitoneum demonstrate no acute abnormality. VASCULATURE: Atherosclerotic changes are present in the aorta without aneurysm. IMPRESSION: 1. Lytic lesions at L2, L4, and S1 without pathologic fracture, consistent with known multiple myeloma. 2. Progressed leftward disc protrusion at L3-4 with mild foraminal narrowing, worse on the left. 3. Progressed leftward disc protrusion at L4-5 with moderate left and mild right foraminal narrowing. 4. Mild disc extrusion at L5-S1 without focal stenosis. Electronically signed by: Audree Leas MD 11/11/2023 12:51 PM EDT RP  Workstation: [id number]   CT THORACIC SPINE W WO CONTRAST Result Date: 11/11/2023 EXAM: CT THORACIC SPINE WITH AND WITHOUT CONTRAST 11/04/2023 02:47:35 PM TECHNIQUE: CT of the thoracic spine was performed with and without the administration of intravenous contrast. Multiplanar reformatted images are provided for review. Automated exposure control, iterative reconstruction, and/or weight based adjustment of the mA/kV was utilized to reduce the radiation dose to as low as reasonably achievable. COMPARISON: MRI of the thoracic spine 03/11/2023. Thoracic spine radiographs 04/21/2023. CLINICAL HISTORY: Worsening back pain. Patient complains of severe mid low back pain for 1-2 months. She is being treated for Multiple Myeloma and had to have a bone marrow biopsy and transplant in 01/2023. No known injury. History of lumbar and thoracic spine surgery in 01/2023 with plates and screws. FINDINGS: BONES AND ALIGNMENT: Fusion of the thoracic spine is again noted from T5 to T10. Diffuse lytic changes and remote pathologic fractures are again noted at T7 and T8. Remote superior endplate fractures are present at T2  and T12. No new osseous lesions are present. Thoracic kyphosis is stable. DEGENERATIVE CHANGES: No gross spinal canal stenosis or bony neural foraminal narrowing of the thoracic spine. SOFT TISSUES: No paraspinal mass or hematoma. LIMITED CHEST: Previous surgery is noted at the gastroesophageal junction. Atherosclerotic changes are present in the aorta. Coronary artery calcifications are present. IMPRESSION: 1. No acute abnormality of the thoracic spine. 2. Stable fusion of the thoracic spine from T5 to T1. 3. Diffuse lytic changes, and remote pathologic fractures at T7 and T8, with no new osseous lesions. Electronically signed by: Audree Leas MD 11/11/2023 12:36 PM EDT RP Workstation: [id number]     Assessment and plan- Patient is a 58 y.o. female with history of standard risk R-ISS stage II IgG lambda  multiple myeloma s/p induction D VRD regimen followed by autoLog a stem cell transplantation.  She is here for on treatment assessment prior to cycle 1 of maintenance Darzalex  as per Manfred Seed protocol  Counts okay to proceed with Darzalex  today.  She will be seen in 4 weeks by covering MD for cycle 2 of maintenance Darzalex  and I will see her back in 2 months for cycle 3.  She is also on Revlimid  10 mg 3 weeks on and 1 week of which she will continue.  Myeloma labs showed no evidence of M protein and serum free light chain ratio is normal.  We will continue to monitor that monthly.  If labs remain stable for the next 3 to 4 months I will consider dropping Darzalex  altogether and only continue with single agent Revlimid  as per [place] trial.  Neoplasm related pain: Discussed results of CT thoracic and lumbar spine which do not show any evidence of progression of myeloma.Stable fusion of thoracic spine from T5-T1 with diffuse lytic changes and remote pathologic fractures of T7 and T8 which remain unchanged.  She is not presently on bisphosphonates as she had significant hypocalcemia following 1 dose.  She will continue with aspirin  and acyclovir  prophylaxis.   Visit Diagnosis 1. High risk medication use   2. Encounter for antineoplastic chemotherapy   3. Multiple myeloma in remission [facility])      Dr. Seretha Dance, MD, MPH [facility] at [facility] [phone] 12/02/2023 8:45 AM

## 2023-12-02 NOTE — Progress Notes (Signed)
 Patient has no concerns

## 2023-12-08 ENCOUNTER — Encounter: Payer: Self-pay | Admitting: Oncology

## 2023-12-08 MED ORDER — HYDROMORPHONE HCL 4 MG PO TABS
4.0000 mg | ORAL_TABLET | ORAL | 0 refills | Status: DC | PRN
Start: 2023-12-08 — End: 2023-12-22

## 2023-12-18 ENCOUNTER — Encounter: Payer: Self-pay | Admitting: Oncology

## 2023-12-22 ENCOUNTER — Other Ambulatory Visit: Payer: Self-pay

## 2023-12-22 MED ORDER — HYDROMORPHONE HCL 4 MG PO TABS: 4.0000 mg | ORAL_TABLET | ORAL | 0 refills | Status: DC | PRN

## 2023-12-30 ENCOUNTER — Inpatient Hospital Stay

## 2023-12-30 ENCOUNTER — Encounter: Payer: Self-pay | Admitting: Oncology

## 2023-12-30 ENCOUNTER — Inpatient Hospital Stay: Attending: Oncology

## 2023-12-30 ENCOUNTER — Inpatient Hospital Stay (HOSPITAL_BASED_OUTPATIENT_CLINIC_OR_DEPARTMENT_OTHER): Admitting: Oncology

## 2023-12-30 VITALS — BP 142/84 | HR 63 | Temp 96.0°F | Resp 18 | Wt 145.6 lb

## 2023-12-30 DIAGNOSIS — C9001 Multiple myeloma in remission: Secondary | ICD-10-CM | POA: Diagnosis not present

## 2023-12-30 DIAGNOSIS — C9000 Multiple myeloma not having achieved remission: Secondary | ICD-10-CM | POA: Diagnosis present

## 2023-12-30 DIAGNOSIS — R634 Abnormal weight loss: Secondary | ICD-10-CM | POA: Insufficient documentation

## 2023-12-30 DIAGNOSIS — Z8719 Personal history of other diseases of the digestive system: Secondary | ICD-10-CM | POA: Diagnosis not present

## 2023-12-30 DIAGNOSIS — Z800 Family history of malignant neoplasm of digestive organs: Secondary | ICD-10-CM | POA: Diagnosis not present

## 2023-12-30 DIAGNOSIS — D702 Other drug-induced agranulocytosis: Secondary | ICD-10-CM | POA: Diagnosis not present

## 2023-12-30 DIAGNOSIS — G893 Neoplasm related pain (acute) (chronic): Secondary | ICD-10-CM | POA: Insufficient documentation

## 2023-12-30 DIAGNOSIS — Z87891 Personal history of nicotine dependence: Secondary | ICD-10-CM | POA: Diagnosis not present

## 2023-12-30 DIAGNOSIS — D701 Agranulocytosis secondary to cancer chemotherapy: Secondary | ICD-10-CM | POA: Diagnosis not present

## 2023-12-30 DIAGNOSIS — Z8049 Family history of malignant neoplasm of other genital organs: Secondary | ICD-10-CM | POA: Insufficient documentation

## 2023-12-30 DIAGNOSIS — Z79891 Long term (current) use of opiate analgesic: Secondary | ICD-10-CM | POA: Insufficient documentation

## 2023-12-30 DIAGNOSIS — Z7952 Long term (current) use of systemic steroids: Secondary | ICD-10-CM | POA: Insufficient documentation

## 2023-12-30 DIAGNOSIS — T451X5A Adverse effect of antineoplastic and immunosuppressive drugs, initial encounter: Secondary | ICD-10-CM | POA: Diagnosis not present

## 2023-12-30 DIAGNOSIS — Z9484 Stem cells transplant status: Secondary | ICD-10-CM | POA: Insufficient documentation

## 2023-12-30 LAB — CMP (CANCER CENTER ONLY)
ALT: 11 U/L (ref 0–44)
AST: 15 U/L (ref 15–41)
Albumin: 3.4 g/dL — ABNORMAL LOW (ref 3.5–5.0)
Alkaline Phosphatase: 70 U/L (ref 38–126)
Anion gap: 8 (ref 5–15)
BUN: 9 mg/dL (ref 6–20)
CO2: 29 mmol/L (ref 22–32)
Calcium: 8.9 mg/dL (ref 8.9–10.3)
Chloride: 99 mmol/L (ref 98–111)
Creatinine: 1.07 mg/dL — ABNORMAL HIGH (ref 0.44–1.00)
GFR, Estimated: 59 mL/min — ABNORMAL LOW (ref >60)
Glucose, Bld: 110 mg/dL — ABNORMAL HIGH (ref 70–99)
Potassium: 3.6 mmol/L (ref 3.5–5.1)
Sodium: 136 mmol/L (ref 135–145)
Total Bilirubin: 0.6 mg/dL (ref 0.0–1.2)
Total Protein: 6 g/dL — ABNORMAL LOW (ref 6.5–8.1)

## 2023-12-30 LAB — CBC WITH DIFFERENTIAL (CANCER CENTER ONLY)
Abs Immature Granulocytes: 0 K/uL (ref 0.00–0.07)
Basophils Absolute: 0.1 K/uL (ref 0.0–0.1)
Basophils Relative: 2 %
Eosinophils Absolute: 0.1 K/uL (ref 0.0–0.5)
Eosinophils Relative: 4 %
HCT: 34.6 % — ABNORMAL LOW (ref 36.0–46.0)
Hemoglobin: 11.2 g/dL — ABNORMAL LOW (ref 12.0–15.0)
Immature Granulocytes: 0 %
Lymphocytes Relative: 66 %
Lymphs Abs: 1.9 K/uL (ref 0.7–4.0)
MCH: 28.6 pg (ref 26.0–34.0)
MCHC: 32.4 g/dL (ref 30.0–36.0)
MCV: 88.3 fL (ref 80.0–100.0)
Monocytes Absolute: 0.2 K/uL (ref 0.1–1.0)
Monocytes Relative: 6 %
Neutro Abs: 0.6 K/uL — ABNORMAL LOW (ref 1.7–7.7)
Neutrophils Relative %: 22 %
Platelet Count: 202 K/uL (ref 150–400)
RBC: 3.92 MIL/uL (ref 3.87–5.11)
RDW: 14.3 % (ref 11.5–15.5)
WBC Count: 2.9 K/uL — ABNORMAL LOW (ref 4.0–10.5)
nRBC: 0 % (ref 0.0–0.2)

## 2023-12-30 NOTE — Progress Notes (Signed)
 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   She presented to the ER with symptoms of pleuritic chest wall pain and shortness of breath and underwent a CT angio chest which showed a soft tissue mass with bone destruction involving the left seventh rib as well as T6 and T7 vertebral bodies and central canal.  Possibility of cord compression.  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   Results of myeloma work-up from 06/20/2020 showed an elevated IgG of 3668.  M protein was elevated at 3.1 and IFE showed IgG monoclonal lambda protein.  Beta-2  microglobulin and LDH were normal.  Serum free light chain ratio was elevated at 25 with a free light chain lambda of 303.  Bone marrow biopsy showed increased plasma cells 22% by manual count and 20% by IHC staining for CD138.  Cytogenetics for myeloma showed gain of 1 q.   MRI cervical and lumbar thoracic spine did not show any evidence of  lytic lesions.  Bone survey was negative for bone lesions as well.  MRI pelvis with and without contrast showed diffuse patchy heterogeneous marrow concerning for myeloma    Patient had a repeat bone marrow biopsy inAugust 2024 which showed 20% overall plasma cells and hypercellular marrow consistent with plasma cell myeloma.  Cytogenetics normal and FISH studies did not show any abnormalities.  Patient had stabilization procedure/spinal fusion T4-T9 with T6-T7 laminotomies.  Spinal mass biopsy was consistent with plasma cell neoplasm. PET CT scan showed solitary hypermetabolic lytic lesion in the L4 vertebral body consistent with multiple myeloma.  Residual soft tissue density at the left paraspinal resection site T6-T7 with no significant metabolic activity.   Plan was to do outpatient daratumumab  Revlimid  Velcade  dexamethasone  regimen.  However patient was found to be in acute kidney failure with a creatinine of 5 in August 2024 and was sent to the ER.  She received cycle 1 of CyBorD in the hospital with improvement in her creatinine with stabilized between 1.5-1.7.  Treatment has been complicated by repeated hospitalizations for acute hypoxic respiratory failure and left pleural effusion which has been treated with antibiotics and thoracentesis.  Patient only received 1 dose of denosumab  in November 2024 and subsequently had to be held due to significant hypocalcemia.   Subsequently patient's clinical condition improved and she started D VRD regimen as per [facility] protocol on 03/31/2023.  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      Interval history- Patient follows up with Dr.Rao who is off today, I am covering her to see this patient.   She reports feeling well today. No fever chills.  + fatigue. Appetite waxes and wanes. She  has lost 5 pounds.  Chronic back pain, unchanged.   ECOG PS- 1 Pain scale- 3 Opioid associated constipation- no  Review of systems- Review of Systems  Constitutional:  Positive for malaise/fatigue. Negative for chills, fever and weight loss.  HENT:  Negative for congestion, ear discharge and nosebleeds.   Eyes:  Negative for blurred vision.  Respiratory:  Negative for cough, hemoptysis, sputum production, shortness of breath and wheezing.   Cardiovascular:  Negative for chest pain, palpitations, orthopnea and claudication.  Gastrointestinal:  Negative for abdominal pain, blood in stool, constipation, diarrhea, heartburn, melena, nausea and vomiting.  Genitourinary:  Negative for dysuria, flank pain, frequency, hematuria and urgency.  Musculoskeletal:  Positive for back pain. Negative for joint pain and myalgias.  Skin:  Negative for rash.  Neurological:  Negative for dizziness, tingling, focal weakness, seizures, weakness and headaches.  Endo/Heme/Allergies:  Does not bruise/bleed easily.  Psychiatric/Behavioral:  Negative for depression and suicidal ideas. The patient does not have insomnia.       Allergies  Allergen Reactions   Hctz [Hydrochlorothiazide ]     Hyponatremia      Past Medical History:  Diagnosis Date   Anxiety    Arthritis    Asthma    Barrett's esophagus 2015   COVID-19    03/10/20   Depression    GERD (gastroesophageal reflux disease)    Hypertension    Hypothyroidism    Pneumonia    Smoldering myeloma    Thyroid  disease      Past Surgical History:  Procedure Laterality Date   ABLATION  2006   APPLICATION OF INTRAOPERATIVE CT SCAN N/A 01/28/2023   Procedure: APPLICATION OF INTRAOPERATIVE CT SCAN;  Surgeon: Claudene Penne Lin, MD;  Location: [facility] ORS;  Service: Neurosurgery;  Laterality: N/A;   BREAST CYST ASPIRATION Left 1998   BREAST SURGERY     cyst aspiration   BREAST SURGERY  1998   cyst aspiration   COLONOSCOPY  03/2014   COLONOSCOPY  WITH PROPOFOL  N/A 01/06/2020   Procedure: COLONOSCOPY WITH PROPOFOL ;  Surgeon: Toledo, Ladell POUR, MD;  Location: [facility] ENDOSCOPY;  Service: Gastroenterology;  Laterality: N/A;   ENDOMETRIAL ABLATION     ESOPHAGOGASTRODUODENOSCOPY (EGD) WITH PROPOFOL  N/A 08/15/2015   Procedure: ESOPHAGOGASTRODUODENOSCOPY (EGD) WITH PROPOFOL ;  Surgeon: Gladis RAYMOND Mariner, MD;  Location: [facility] ENDOSCOPY;  Service: Endoscopy;  Laterality: N/A;   ESOPHAGOGASTRODUODENOSCOPY (EGD) WITH PROPOFOL  N/A 01/22/2016   Procedure: ESOPHAGOGASTRODUODENOSCOPY (EGD) WITH PROPOFOL ;  Surgeon: Gladis RAYMOND Mariner, MD;  Location: [facility] END[facility];  Service: Endoscopy;  Laterality: N/A;   ESOPHAGOGASTRODUODENOSCOPY (EGD) WITH PROPOFOL  N/A 01/06/2020   Procedure: ESOPHAGOGASTRODUODENOSCOPY (EGD) WITH PROPOFOL ;  Surgeon: Toledo, Ladell POUR, MD;  Location: [facility] ENDOSCOPY;  Service: Gastroenterology;  Laterality: N/A;   FLEXIBLE BRONCHOSCOPY Bilateral 03/13/2023   Procedure: FLEXIBLE BRONCHOSCOPY;  Surgeon: Isaiah Scrivener, MD;  Location: [facility] ORS;  Service: Cardiopulmonary;  Laterality: Bilateral;   GASTRIC BYPASS  2005   HERNIA REPAIR     IR BONE MARROW BIOPSY & ASPIRATION  02/05/2023   NASAL SINUS SURGERY     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   TOTAL THYROIDECTOMY     TUBAL LIGATION      Social History   Socioeconomic History  Marital status: Divorced    Spouse name: Not on file   Number of children: 2   Years of education: Not on file   Highest education level: GED or equivalent  Occupational History   Occupation: Part-time  Tobacco Use   Smoking status: Former    Types: Cigarettes   Smokeless tobacco: Never   Tobacco comments:    Quit smoking 02/16/2023  Vaping Use   Vaping status: Never Used  Substance and Sexual Activity   Alcohol use: Yes    Alcohol/week: 10.0 - 12.0 standard drinks of alcohol    Types: 8 - 10 Glasses of wine, 2 Standard drinks or equivalent per week   Drug use: No   Sexual activity: Yes     Partners: Male  Other Topics Concern   Not on file  Social History Narrative   Lives in [place] single, divorced       Work - 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       Diet - healthy diet   Exercise - limited   Caffeine  use: daily   Social Drivers of Health   Financial Resource Strain: Low Risk  (11/01/2022)   Overall Financial Resource Strain (CARDIA)    Difficulty of Paying Living Expenses: Not very hard  Food Insecurity: No Food Insecurity (08/26/2023)   Hunger Vital Sign    Worried About Running Out of Food in the Last Year: Never true    Ran Out of Food in the Last Year: Never true  Transportation Needs: No Transportation Needs (08/26/2023)   PRAPARE - Administrator, Civil Service (Medical): No    Lack of Transportation (Non-Medical): No  Physical Activity: Insufficiently Active (11/01/2022)   Exercise Vital Sign    Days of Exercise per Week: 3 days    Minutes of Exercise per Session: 20 min  Stress: Stress Concern Present (03/04/2023)   [employer] of Occupational Health - Occupational Stress Questionnaire    Feeling of Stress : Very much  Social Connections: Socially Isolated (03/04/2023)   Social Connection and Isolation Panel    Frequency of Communication with Friends and Family: Never    Frequency of Social Gatherings with Friends and Family: Never    Attends Religious Services: Never    Database administrator or Organizations: No    Attends Banker Meetings: Never    Marital Status: Divorced  Catering manager Violence: Not At Risk (08/26/2023)   Humiliation, Afraid, Rape, and Kick questionnaire    Fear of Current or Ex-Partner: No    Emotionally Abused: No    Physically Abused: No    Sexually Abused: No    Family History  Problem Relation Age of Onset   Heart disease Mother    COPD Mother    Arthritis Mother    Cancer Mother        uterine   Lupus Mother    Anxiety disorder Mother    Depression Mother    Drug abuse Mother     COPD Father    Arthritis Father    Heart disease Father    Heart attack Father    Alcohol abuse Father    Heart disease Brother    Heart attack Brother    Drug abuse Brother    Anxiety disorder Brother    Depression Brother    Heart disease Brother    Cancer Brother        liver cancer age 58 y.o   Diabetes Maternal Grandmother  Diabetes Maternal Grandfather    Diabetes Paternal Grandmother    Diabetes Paternal Grandfather    Breast cancer Neg Hx      Current Outpatient Medications:    butalbital -acetaminophen -caffeine  (FIORICET ) 50-325-40 MG tablet, TAKE 1 TABLET BY MOUTH EVERY 4 HOURS AS NEEDED FOR HEADACHE, Disp: 14 tablet, Rfl: 3   calcium  carbonate (OSCAL) 1500 (600 Ca) MG TABS tablet, Take 1,500 mg by mouth 2 (two) times daily with a meal., Disp: , Rfl:    clonazePAM  (KLONOPIN ) 1 MG tablet, TAKE 1 TABLET(1 MG) BY MOUTH TWICE DAILY (Patient taking differently: Take 1 mg by mouth 3 (three) times daily as needed.), Disp: 60 tablet, Rfl: 2   dexamethasone  (DECADRON ) 4 MG tablet, Take 5 tablets (20 mg total) by mouth 2 (two) times daily with a meal., Disp: 25 tablet, Rfl: 0   escitalopram  (LEXAPRO ) 10 MG tablet, Take 1 tablet (10 mg total) by mouth daily., Disp: 30 tablet, Rfl: 2   gabapentin  (NEURONTIN ) 300 MG capsule, Take 300 mg by mouth., Disp: , Rfl:    HYDROmorphone  (DILAUDID ) 4 MG tablet, Take 1 tablet (4 mg total) by mouth every 4 (four) hours as needed for severe pain (pain score 7-10)., Disp: 60 tablet, Rfl: 0   ipratropium-albuterol  (DUONEB) 0.5-2.5 (3) MG/3ML SOLN, Take 3 mLs by nebulization 4 (four) times daily., Disp: 360 mL, Rfl: 0   lenalidomide  (REVLIMID ) 10 MG capsule, TAKE 1 CAPSULE DAILY FOR 3 WEEKS ON AND THEN HOLD FOR 1 WEEK, Disp: 21 capsule, Rfl: 0   levothyroxine  (SYNTHROID ) 125 MCG tablet, Take 2 tablets (250 mcg total) by mouth daily before breakfast., Disp: 60 tablet, Rfl: 0   pantoprazole  (PROTONIX ) 40 MG tablet, 30 min before lunch or dinner, Disp:  90 tablet, Rfl: 3   potassium chloride  SA (KLOR-CON  M) 20 MEQ tablet, Take 20 mEq by mouth 2 (two) times daily., Disp: , Rfl:    Spacer/Aero-Holding Chambers (AEROCHAMBER MV) inhaler, Use as instructed, Disp: 1 each, Rfl: 0   Specialty Vitamins Products (MG PLUS PROTEIN) 133 MG TABS, Take 1 tablet by mouth every morning., Disp: , Rfl:    thiamine  (VITAMIN B-1) 100 MG tablet, Take 1 tablet (100 mg total) by mouth daily., Disp: 30 tablet, Rfl: 0   zolpidem  (AMBIEN ) 10 MG tablet, TAKE 1/2 TO 1 TABLET(5 TO 10 MG) BY MOUTH AT BEDTIME AS NEEDED FOR SLEEP (Patient not taking: Reported on 12/30/2023), Disp: 30 tablet, Rfl: 2  Physical exam:  Vitals:   12/30/23 0849  BP: (!) 142/84  Pulse: 63  Resp: 18  Temp: (!) 96 F (35.6 C)  TempSrc: Tympanic  SpO2: 99%  Weight: 145 lb 9.6 oz (66 kg)   Physical Exam Cardiovascular:     Rate and Rhythm: Normal rate and regular rhythm.     Heart sounds: Normal heart sounds.  Pulmonary:     Effort: Pulmonary effort is normal.     Breath sounds: Normal breath sounds.  Abdominal:     General: Bowel sounds are normal.  Skin:    General: Skin is warm and dry.  Neurological:     Mental Status: She is alert and oriented to person, place, and time.      I have personally reviewed labs listed below:    Latest Ref Rng & Units 12/30/2023    8:41 AM  CMP  Glucose 70 - 99 mg/dL 889   BUN 6 - 20 mg/dL 9   Creatinine 9.55 - 8.99 mg/dL 8.92   Sodium 864 -  145 mmol/L 136   Potassium 3.5 - 5.1 mmol/L 3.6   Chloride 98 - 111 mmol/L 99   CO2 22 - 32 mmol/L 29   Calcium  8.9 - 10.3 mg/dL 8.9   Total Protein 6.5 - 8.1 g/dL 6.0   Total Bilirubin 0.0 - 1.2 mg/dL 0.6   Alkaline Phos 38 - 126 U/L 70   AST 15 - 41 U/L 15   ALT 0 - 44 U/L 11       Latest Ref Rng & Units 12/30/2023    8:41 AM  CBC  WBC 4.0 - 10.5 K/uL 2.9   Hemoglobin 12.0 - 15.0 g/dL 88.7   Hematocrit 63.9 - 46.0 % 34.6   Platelets 150 - 400 K/uL 202    I have personally reviewed Radiology  images listed below: No images are attached to the encounter.  No results found.    Assessment and plan- Patient is a 58 y.o. 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  She is here for on treatment assessment prior to cycle 2 of maintenance Darzalex  + Revlimid    # standard risk R-ISS stage II IgG lambda multiple myeloma  Labs are reviewed and discussed with patient. ANC <1 Hold off Darzalex  + Revlimid  Consider GSF if ANC is persistently low next and also cosnider lower maintenance Revlimd dosage.  She will continue with aspirin  and acyclovir  prophylaxis.  # Neutropenia, recommend neutropenia precaution.  # Neoplasm related pain/ bone lesions:  She is not presently on bisphosphonates as she had significant hypocalcemia following 1 dose. Dilaudid  PRN for pain. Controlled well.   # weight loss refer to nutritionist   Visit Diagnosis 1. Multiple myeloma in remission (HCC)   2. Loss of weight   3. Neoplasm related pain   4. Drug-induced neutropenia (HCC)    Follow up in 1 week   Zelphia Cap, MD, PhD [facility] Hematology Oncology 12/30/2023

## 2023-12-31 LAB — KAPPA/LAMBDA LIGHT CHAINS
Kappa free light chain: 35.4 mg/L — ABNORMAL HIGH (ref 3.3–19.4)
Kappa, lambda light chain ratio: 1.82 — ABNORMAL HIGH (ref 0.26–1.65)
Lambda free light chains: 19.4 mg/L (ref 5.7–26.3)

## 2024-01-02 LAB — MULTIPLE MYELOMA PANEL, SERUM
Albumin SerPl Elph-Mcnc: 3.1 g/dL (ref 2.9–4.4)
Albumin/Glob SerPl: 1.3 (ref 0.7–1.7)
Alpha 1: 0.3 g/dL (ref 0.0–0.4)
Alpha2 Glob SerPl Elph-Mcnc: 0.6 g/dL (ref 0.4–1.0)
B-Globulin SerPl Elph-Mcnc: 0.8 g/dL (ref 0.7–1.3)
Gamma Glob SerPl Elph-Mcnc: 0.7 g/dL (ref 0.4–1.8)
Globulin, Total: 2.4 g/dL (ref 2.2–3.9)
IgA: 45 mg/dL — ABNORMAL LOW (ref 87–352)
IgG (Immunoglobin G), Serum: 727 mg/dL (ref 586–1602)
IgM (Immunoglobulin M), Srm: 25 mg/dL — ABNORMAL LOW (ref 26–217)
Total Protein ELP: 5.5 g/dL — ABNORMAL LOW (ref 6.0–8.5)

## 2024-01-04 ENCOUNTER — Other Ambulatory Visit: Payer: Self-pay | Admitting: Oncology

## 2024-01-04 DIAGNOSIS — C9000 Multiple myeloma not having achieved remission: Secondary | ICD-10-CM

## 2024-01-05 ENCOUNTER — Encounter: Payer: Self-pay | Admitting: Oncology

## 2024-01-05 MED ORDER — HYDROMORPHONE HCL 4 MG PO TABS: 4.0000 mg | ORAL_TABLET | ORAL | 0 refills | Status: DC | PRN

## 2024-01-05 NOTE — Telephone Encounter (Signed)
 Per Dr. Layvonne last OV note dated 12/30/23 ANC <1 Hold off Darzalex  + Revlimid . Consider GSF if ANC is persistently low next and also cosnider lower maintenance Revlimd dosage. She will continue with aspirin  and acyclovir  prophylaxis.SABRA Huge refill request to Dr. Babara for review.

## 2024-01-08 ENCOUNTER — Inpatient Hospital Stay

## 2024-01-08 ENCOUNTER — Encounter: Payer: Self-pay | Admitting: Oncology

## 2024-01-08 ENCOUNTER — Inpatient Hospital Stay: Admitting: Oncology

## 2024-01-08 VITALS — BP 136/91 | Temp 98.4°F | Resp 18 | Wt 149.5 lb

## 2024-01-08 DIAGNOSIS — C9000 Multiple myeloma not having achieved remission: Secondary | ICD-10-CM | POA: Diagnosis not present

## 2024-01-08 DIAGNOSIS — C9001 Multiple myeloma in remission: Secondary | ICD-10-CM | POA: Diagnosis not present

## 2024-01-08 DIAGNOSIS — D702 Other drug-induced agranulocytosis: Secondary | ICD-10-CM | POA: Diagnosis not present

## 2024-01-08 DIAGNOSIS — G893 Neoplasm related pain (acute) (chronic): Secondary | ICD-10-CM | POA: Diagnosis not present

## 2024-01-08 DIAGNOSIS — Z5111 Encounter for antineoplastic chemotherapy: Secondary | ICD-10-CM

## 2024-01-08 LAB — CMP (CANCER CENTER ONLY)
ALT: 20 U/L (ref 0–44)
AST: 23 U/L (ref 15–41)
Albumin: 3.4 g/dL — ABNORMAL LOW (ref 3.5–5.0)
Alkaline Phosphatase: 72 U/L (ref 38–126)
Anion gap: 7 (ref 5–15)
BUN: 14 mg/dL (ref 6–20)
CO2: 21 mmol/L — ABNORMAL LOW (ref 22–32)
Calcium: 8.8 mg/dL — ABNORMAL LOW (ref 8.9–10.3)
Chloride: 108 mmol/L (ref 98–111)
Creatinine: 0.98 mg/dL (ref 0.44–1.00)
GFR, Estimated: >60 mL/min (ref >60)
Glucose, Bld: 98 mg/dL (ref 70–99)
Potassium: 4.1 mmol/L (ref 3.5–5.1)
Sodium: 136 mmol/L (ref 135–145)
Total Bilirubin: 0.5 mg/dL (ref 0.0–1.2)
Total Protein: 5.6 g/dL — ABNORMAL LOW (ref 6.5–8.1)

## 2024-01-08 LAB — CBC WITH DIFFERENTIAL (CANCER CENTER ONLY)
Abs Immature Granulocytes: 0.02 K/uL (ref 0.00–0.07)
Basophils Absolute: 0.1 K/uL (ref 0.0–0.1)
Basophils Relative: 1 %
Eosinophils Absolute: 0.1 K/uL (ref 0.0–0.5)
Eosinophils Relative: 1 %
HCT: 30.2 % — ABNORMAL LOW (ref 36.0–46.0)
Hemoglobin: 10.1 g/dL — ABNORMAL LOW (ref 12.0–15.0)
Immature Granulocytes: 0 %
Lymphocytes Relative: 46 %
Lymphs Abs: 2.9 K/uL (ref 0.7–4.0)
MCH: 28.9 pg (ref 26.0–34.0)
MCHC: 33.4 g/dL (ref 30.0–36.0)
MCV: 86.5 fL (ref 80.0–100.0)
Monocytes Absolute: 0.7 K/uL (ref 0.1–1.0)
Monocytes Relative: 11 %
Neutro Abs: 2.6 K/uL (ref 1.7–7.7)
Neutrophils Relative %: 41 %
Platelet Count: 134 K/uL — ABNORMAL LOW (ref 150–400)
RBC: 3.49 MIL/uL — ABNORMAL LOW (ref 3.87–5.11)
RDW: 14.4 % (ref 11.5–15.5)
WBC Count: 6.3 K/uL (ref 4.0–10.5)
nRBC: 0 % (ref 0.0–0.2)

## 2024-01-08 MED ORDER — DEXAMETHASONE 4 MG PO TABS
20.0000 mg | ORAL_TABLET | Freq: Once | ORAL | Status: AC
  Administered 2024-01-08: 20 mg via ORAL
  Filled 2024-01-08: qty 5

## 2024-01-08 MED ORDER — DARATUMUMAB-HYALURONIDASE-FIHJ 1800-30000 MG-UT/15ML ~~LOC~~ SOLN
1800.0000 mg | Freq: Once | SUBCUTANEOUS | Status: AC
  Administered 2024-01-08: 1800 mg via SUBCUTANEOUS
  Filled 2024-01-08: qty 15

## 2024-01-08 MED ORDER — DIPHENHYDRAMINE HCL 25 MG PO CAPS
50.0000 mg | ORAL_CAPSULE | Freq: Once | ORAL | Status: AC
  Administered 2024-01-08: 50 mg via ORAL
  Filled 2024-01-08: qty 2

## 2024-01-08 MED ORDER — ACETAMINOPHEN 325 MG PO TABS
650.0000 mg | ORAL_TABLET | Freq: Once | ORAL | Status: AC
  Administered 2024-01-08: 650 mg via ORAL
  Filled 2024-01-08: qty 2

## 2024-01-08 NOTE — Progress Notes (Signed)
 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   She presented to the ER with symptoms of pleuritic chest wall pain and shortness of breath and underwent a CT angio chest which showed a soft tissue mass with bone destruction involving the left seventh rib as well as T6 and T7 vertebral bodies and central canal.  Possibility of cord compression.  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   Results of myeloma work-up from 06/20/2020 showed an elevated IgG of 3668.  M protein was elevated at 3.1 and IFE showed IgG monoclonal lambda protein.  Beta-2  microglobulin and LDH were normal.  Serum free light chain ratio was elevated at 25 with a free light chain lambda of 303.  Bone marrow biopsy showed increased plasma cells 22% by manual count and 20% by IHC staining for CD138.  Cytogenetics for myeloma showed gain of 1 q.   MRI cervical and lumbar  thoracic spine did not show any evidence of lytic lesions.  Bone survey was negative for bone lesions as well.  MRI pelvis with and without contrast showed diffuse patchy heterogeneous marrow concerning for myeloma    Patient had a repeat bone marrow biopsy inAugust 2024 which showed 20% overall plasma cells and hypercellular marrow consistent with plasma cell myeloma.  Cytogenetics normal and FISH studies did not show any abnormalities.  Patient had stabilization procedure/spinal fusion T4-T9 with T6-T7 laminotomies.  Spinal mass biopsy was consistent with plasma cell neoplasm. PET CT scan showed solitary hypermetabolic lytic lesion in the L4 vertebral body consistent with multiple myeloma.  Residual soft tissue density at the left paraspinal resection site T6-T7 with no significant metabolic activity.   Plan was to do outpatient daratumumab  Revlimid  Velcade  dexamethasone  regimen.  However patient was found to be in acute kidney failure with a creatinine of 5 in August 2024 and was sent to the ER.  She received cycle 1 of CyBorD in the hospital with improvement in her creatinine with stabilized between 1.5-1.7.  Treatment has been complicated by repeated hospitalizations for acute hypoxic respiratory failure and left pleural effusion which has been treated with antibiotics and thoracentesis.  Patient only received 1 dose of denosumab  in November 2024 and subsequently had to be held due to significant hypocalcemia.   Subsequently patient's clinical condition improved and she started D VRD regimen as per [facility] protocol on 03/31/2023.  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      Interval history- Patient follows up with Dr.Rao who is off today, I am covering her to see this patient.   She reports feeling well today. No fever chills.   + fatigue. Appetite waxes and wanes.  Chronic back pain, unchanged.   ECOG PS- 1 Pain scale- 3 Opioid associated constipation- no  Review of systems- Review of Systems  Constitutional:  Positive for malaise/fatigue. Negative for chills, fever and weight loss.  HENT:  Negative for congestion, ear discharge and nosebleeds.   Eyes:  Negative for blurred vision.  Respiratory:  Negative for cough, hemoptysis, sputum production, shortness of breath and wheezing.   Cardiovascular:  Negative for chest pain, palpitations, orthopnea and claudication.  Gastrointestinal:  Negative for abdominal pain, blood in stool, constipation, diarrhea, heartburn, melena, nausea and vomiting.  Genitourinary:  Negative for dysuria, flank pain, frequency, hematuria and urgency.  Musculoskeletal:  Positive for back pain. Negative for joint pain and myalgias.  Skin:  Negative for rash.  Neurological:  Negative for dizziness, tingling, focal weakness, seizures, weakness and headaches.  Endo/Heme/Allergies:  Does not bruise/bleed easily.  Psychiatric/Behavioral:  Negative for depression and suicidal ideas. The patient does not have insomnia.       Allergies  Allergen Reactions   Hctz [Hydrochlorothiazide ]     Hyponatremia      Past Medical History:  Diagnosis Date   Anxiety    Arthritis    Asthma    Barrett's esophagus 2015   COVID-19    03/10/20   Depression    GERD (gastroesophageal reflux disease)    Hypertension    Hypothyroidism    Pneumonia    Smoldering myeloma    Thyroid  disease      Past Surgical History:  Procedure Laterality Date   ABLATION  2006   APPLICATION OF INTRAOPERATIVE CT SCAN N/A 01/28/2023   Procedure: APPLICATION OF INTRAOPERATIVE CT SCAN;  Surgeon: Claudene Penne Lin, MD;  Location: [facility] ORS;  Service: Neurosurgery;  Laterality: N/A;   BREAST CYST ASPIRATION Left 1998   BREAST SURGERY     cyst aspiration   BREAST SURGERY  1998   cyst aspiration   COLONOSCOPY   03/2014   COLONOSCOPY WITH PROPOFOL  N/A 01/06/2020   Procedure: COLONOSCOPY WITH PROPOFOL ;  Surgeon: Toledo, Ladell POUR, MD;  Location: [facility] ENDOSCOPY;  Service: Gastroenterology;  Laterality: N/A;   ENDOMETRIAL ABLATION     ESOPHAGOGASTRODUODENOSCOPY (EGD) WITH PROPOFOL  N/A 08/15/2015   Procedure: ESOPHAGOGASTRODUODENOSCOPY (EGD) WITH PROPOFOL ;  Surgeon: Gladis RAYMOND Mariner, MD;  Location: [facility] ENDOSCOPY;  Service: Endoscopy;  Laterality: N/A;   ESOPHAGOGASTRODUODENOSCOPY (EGD) WITH PROPOFOL  N/A 01/22/2016   Procedure: ESOPHAGOGASTRODUODENOSCOPY (EGD) WITH PROPOFOL ;  Surgeon: Gladis RAYMOND Mariner, MD;  Location[facility] END[facility]COPY;  Service: Endoscopy;  Laterality: N/A;   ESOPHAGOGASTRODUODENOSCOPY (EGD) WITH PROPOFOL  N/A 01/06/2020   Procedure: ESOPHAGOGASTRODUODENOSCOPY (EGD) WITH PROPOFOL ;  Surgeon: Toledo, Ladell POUR, MD;  Location: [facility] ENDOSCOPY;  Service: Gastroenterology;  Laterality: N/A;   FLEXIBLE BRONCHOSCOPY Bilateral 03/13/2023   Procedure: FLEXIBLE BRONCHOSCOPY;  Surgeon: Isaiah Scrivener, MD;  Location: [facility] ORS;  Service: Cardiopulmonary;  Laterality: Bilateral;   GASTRIC BYPASS  2005   HERNIA REPAIR     IR BONE MARROW BIOPSY & ASPIRATION  02/05/2023   NASAL SINUS SURGERY     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   TOTAL THYROIDECTOMY     TUBAL LIGATION      Social History  Socioeconomic History   Marital status: Divorced    Spouse name: Not on file   Number of children: 2   Years of education: Not on file   Highest education level: GED or equivalent  Occupational History   Occupation: Part-time  Tobacco Use   Smoking status: Former    Types: Cigarettes   Smokeless tobacco: Never   Tobacco comments:    Quit smoking 02/16/2023  Vaping Use   Vaping status: Never Used  Substance and Sexual Activity   Alcohol use: Yes    Alcohol/week: 10.0 - 12.0 standard drinks of alcohol    Types: 8 - 10 Glasses of wine, 2 Standard drinks or equivalent per week   Drug use: No   Sexual  activity: Yes    Partners: Male  Other Topics Concern   Not on file  Social History Narrative   Lives in [place] single, divorced       Work - 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       Diet - healthy diet   Exercise - limited   Caffeine  use: daily   Social Drivers of Corporate investment banker Strain: Low Risk  (11/01/2022)   Overall Financial Resource Strain (CARDIA)    Difficulty of Paying Living Expenses: Not very hard  Food Insecurity: No Food Insecurity (08/26/2023)   Hunger Vital Sign    Worried About Running Out of Food in the Last Year: Never true    Ran Out of Food in the Last Year: Never true  Transportation Needs: No Transportation Needs (08/26/2023)   PRAPARE - Administrator, Civil Service (Medical): No    Lack of Transportation (Non-Medical): No  Physical Activity: Insufficiently Active (11/01/2022)   Exercise Vital Sign    Days of Exercise per Week: 3 days    Minutes of Exercise per Session: 20 min  Stress: Stress Concern Present (03/04/2023)   [employer] of Occupational Health - Occupational Stress Questionnaire    Feeling of Stress : Very much  Social Connections: Socially Isolated (03/04/2023)   Social Connection and Isolation Panel    Frequency of Communication with Friends and Family: Never    Frequency of Social Gatherings with Friends and Family: Never    Attends Religious Services: Never    Database administrator or Organizations: No    Attends Banker Meetings: Never    Marital Status: Divorced  Catering manager Violence: Not At Risk (08/26/2023)   Humiliation, Afraid, Rape, and Kick questionnaire    Fear of Current or Ex-Partner: No    Emotionally Abused: No    Physically Abused: No    Sexually Abused: No    Family History  Problem Relation Age of Onset   Heart disease Mother    COPD Mother    Arthritis Mother    Cancer Mother        uterine   Lupus Mother    Anxiety disorder Mother    Depression Mother     Drug abuse Mother    COPD Father    Arthritis Father    Heart disease Father    Heart attack Father    Alcohol abuse Father    Heart disease Brother    Heart attack Brother    Drug abuse Brother    Anxiety disorder Brother    Depression Brother    Heart disease Brother    Cancer Brother        liver cancer age 30 y.o  Diabetes Maternal Grandmother    Diabetes Maternal Grandfather    Diabetes Paternal Grandmother    Diabetes Paternal Grandfather    Breast cancer Neg Hx      Current Outpatient Medications:    butalbital -acetaminophen -caffeine  (FIORICET ) 50-325-40 MG tablet, TAKE 1 TABLET BY MOUTH EVERY 4 HOURS AS NEEDED FOR HEADACHE, Disp: 14 tablet, Rfl: 3   calcium  carbonate (OSCAL) 1500 (600 Ca) MG TABS tablet, Take 1,500 mg by mouth 2 (two) times daily with a meal., Disp: , Rfl:    clonazePAM  (KLONOPIN ) 1 MG tablet, TAKE 1 TABLET(1 MG) BY MOUTH TWICE DAILY, Disp: 60 tablet, Rfl: 2   dexamethasone  (DECADRON ) 4 MG tablet, Take 5 tablets (20 mg total) by mouth 2 (two) times daily with a meal., Disp: 25 tablet, Rfl: 0   escitalopram  (LEXAPRO ) 10 MG tablet, Take 1 tablet (10 mg total) by mouth daily., Disp: 30 tablet, Rfl: 2   gabapentin  (NEURONTIN ) 300 MG capsule, Take 300 mg by mouth., Disp: , Rfl:    HYDROmorphone  (DILAUDID ) 4 MG tablet, Take 1 tablet (4 mg total) by mouth every 4 (four) hours as needed for severe pain (pain score 7-10)., Disp: 60 tablet, Rfl: 0   ipratropium-albuterol  (DUONEB) 0.5-2.5 (3) MG/3ML SOLN, Take 3 mLs by nebulization 4 (four) times daily., Disp: 360 mL, Rfl: 0   levothyroxine  (SYNTHROID ) 125 MCG tablet, Take 2 tablets (250 mcg total) by mouth daily before breakfast., Disp: 60 tablet, Rfl: 0   pantoprazole  (PROTONIX ) 40 MG tablet, 30 min before lunch or dinner, Disp: 90 tablet, Rfl: 3   potassium chloride  SA (KLOR-CON  M) 20 MEQ tablet, Take 20 mEq by mouth 2 (two) times daily., Disp: , Rfl:    Spacer/Aero-Holding Chambers (AEROCHAMBER MV) inhaler, Use as  instructed, Disp: 1 each, Rfl: 0   Specialty Vitamins Products (MG PLUS PROTEIN) 133 MG TABS, Take 1 tablet by mouth every morning., Disp: , Rfl:    thiamine  (VITAMIN B-1) 100 MG tablet, Take 1 tablet (100 mg total) by mouth daily., Disp: 30 tablet, Rfl: 0   lenalidomide  (REVLIMID ) 10 MG capsule, TAKE 1 CAPSULE DAILY FOR 3 WEEKS ON AND THEN HOLD FOR 1 WEEK (Patient not taking: Reported on 01/08/2024), Disp: 21 capsule, Rfl: 0   zolpidem  (AMBIEN ) 10 MG tablet, TAKE 1/2 TO 1 TABLET(5 TO 10 MG) BY MOUTH AT BEDTIME AS NEEDED FOR SLEEP (Patient not taking: Reported on 01/08/2024), Disp: 30 tablet, Rfl: 2  Physical exam:  Vitals:   01/08/24 0843  BP: (!) 136/91  Resp: 18  Temp: 98.4 F (36.9 C)  Weight: 149 lb 8 oz (67.8 kg)   Physical Exam Cardiovascular:     Rate and Rhythm: Normal rate and regular rhythm.     Heart sounds: Normal heart sounds.  Pulmonary:     Effort: Pulmonary effort is normal.     Breath sounds: Normal breath sounds.  Abdominal:     General: Bowel sounds are normal.  Skin:    General: Skin is warm and dry.  Neurological:     Mental Status: She is alert and oriented to person, place, and time.      I have personally reviewed labs listed below:    Latest Ref Rng & Units 01/08/2024    8:05 AM  CMP  Glucose 70 - 99 mg/dL 98   BUN 6 - 20 mg/dL 14   Creatinine 9.55 - 1.00 mg/dL 9.01   Sodium 864 - 854 mmol/L 136   Potassium 3.5 - 5.1 mmol/L 4.1  Chloride 98 - 111 mmol/L 108   CO2 22 - 32 mmol/L 21   Calcium  8.9 - 10.3 mg/dL 8.8   Total Protein 6.5 - 8.1 g/dL 5.6   Total Bilirubin 0.0 - 1.2 mg/dL 0.5   Alkaline Phos 38 - 126 U/L 72   AST 15 - 41 U/L 23   ALT 0 - 44 U/L 20       Latest Ref Rng & Units 01/08/2024    8:05 AM  CBC  WBC 4.0 - 10.5 K/uL 6.3   Hemoglobin 12.0 - 15.0 g/dL 89.8   Hematocrit 63.9 - 46.0 % 30.2   Platelets 150 - 400 K/uL 134    I have personally reviewed Radiology images listed below: No images are attached to the  encounter.  No results found.    Assessment and plan- Patient is a 58 y.o. 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  She is here for on treatment assessment prior to cycle 3 maintenance Darzalex  + Revlimid    # standard risk R-ISS stage II IgG lambda multiple myeloma  Labs are reviewed and discussed with patient. ANC normalized.  Proceed with Darzalex , recommend take Revlimid10mg  for 2 weeks instead of 3 weeks for this cycle. Consider lower Revlimid  to 5mg  3 weeks on 1 week off when combined with Darzalex  with future cycles. I will defer to Dr. Melanee.    She will continue with aspirin  and acyclovir  prophylaxis.  # Neutropenia, resolved.  # Neoplasm related pain/ bone lesions:  She is not presently on bisphosphonates as she had significant hypocalcemia following 1 dose. Dilaudid  PRN for pain. Controlled well.   # weight loss I have referred her to nutritionist   Visit Diagnosis No diagnosis found.  Follow up in 4 weeks  Zelphia Cap, MD, PhD [facility] Hematology Oncology 01/08/2024

## 2024-01-08 NOTE — Progress Notes (Signed)
 Nutrition Assessment   Reason for Assessment:  Weight loss   ASSESSMENT:  58 year old female with multiple myeloma.  Past medical history of GERD, HTN, hypothyroidism, barrett's esophagus.  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  Patient receiving maintenance darzalex .  Met with patient during infusion.  Reports that her appetite is up and down.  Yesterday ate a bean burrito for lunch and stuffed green pepper for dinner.  Reports that some foods do not have much taste but this does not keep her from eating.  Reports loose stool about 3-4 times a day.  Has drank premier protein shakes in the past but not currently because appetite and weight is good.  Does not want to gain water above 150 lb.     Medications: KCL, dexamethasone , protonix , revlimid , thamine, calcium  carbonate.     Labs: reviewed   Anthropometrics:   Weight 149 lb 8 oz today  153 lb 112.3 oz on 5/21 157 lb on 4/2 174 lb on 1/13  NUTRITION DIAGNOSIS: none at this time   INTERVENTION:  Encouraged well balanced diet including lean protein foods.  Encouraged patient to discuss frequent loose stool with MD Contact information provided   MONITORING, EVALUATION, GOAL: weight trends, intake   Next Visit: Tuesday, August 19 during infusion  Ardell Makarewicz B. Dasie SOLON, CSO, LDN Registered Dietitian [phone]

## 2024-01-08 NOTE — Progress Notes (Signed)
 Pt here for follow up. Pt complains of back pain 6/10. States she took pain medication this morning.

## 2024-01-09 ENCOUNTER — Encounter: Payer: Self-pay | Admitting: Oncology

## 2024-01-09 ENCOUNTER — Inpatient Hospital Stay

## 2024-01-09 NOTE — Progress Notes (Signed)
 [facility] CSW Progress Note  Clinical Child psychotherapist contacted patient by phone to follow-up on social security disabilty.    Interventions: CSW received a referral from Dr. Darold nurse.  She had received documents requesting information related to social security disability.  Informed patient that documents required an assessment from a PCP.  Patient stated she understood.      Follow Up Plan:  Patient will contact CSW with any support or resource needs    Macario CHRISTELLA Au, LCSW Clinical Social Worker [facility]

## 2024-01-15 ENCOUNTER — Encounter: Payer: Self-pay | Admitting: Oncology

## 2024-01-16 ENCOUNTER — Encounter: Payer: Self-pay | Admitting: Hospice and Palliative Medicine

## 2024-01-19 ENCOUNTER — Other Ambulatory Visit: Payer: Self-pay | Admitting: Hospice and Palliative Medicine

## 2024-01-19 MED ORDER — HYDROMORPHONE HCL 4 MG PO TABS: 4.0000 mg | ORAL_TABLET | ORAL | 0 refills | Status: DC | PRN

## 2024-01-19 NOTE — Telephone Encounter (Signed)
 Have you done it or would you like me to? If she due for it- ok to refill

## 2024-01-20 ENCOUNTER — Encounter: Payer: Self-pay | Admitting: Oncology

## 2024-01-20 ENCOUNTER — Other Ambulatory Visit: Payer: Self-pay | Admitting: Family Medicine

## 2024-01-20 ENCOUNTER — Telehealth: Payer: Self-pay

## 2024-01-20 DIAGNOSIS — Z1231 Encounter for screening mammogram for malignant neoplasm of breast: Secondary | ICD-10-CM

## 2024-01-20 NOTE — Telephone Encounter (Signed)
 Copied from CRM [id number]. Topic: General - Other >> Jan 20, 2024  1:17 PM Gustabo D wrote: Patient has been trying to get disability for over a year and hasn't been able to get it. Her cancel doctor won't fill out the paperwork she needs to get disability. She wants to know if her pcp can fill out the paperwork and fax it back.

## 2024-01-20 NOTE — Telephone Encounter (Signed)
 Attempted to reach patient to schedule an appointment to discuss further. Okay to schedule in next available if patient calls back.

## 2024-01-23 ENCOUNTER — Other Ambulatory Visit: Payer: Self-pay | Admitting: *Deleted

## 2024-01-23 DIAGNOSIS — C9000 Multiple myeloma not having achieved remission: Secondary | ICD-10-CM

## 2024-01-23 MED ORDER — LENALIDOMIDE 10 MG PO CAPS: ORAL_CAPSULE | ORAL | 0 refills | Status: DC

## 2024-01-27 ENCOUNTER — Other Ambulatory Visit

## 2024-01-27 ENCOUNTER — Ambulatory Visit

## 2024-01-27 ENCOUNTER — Ambulatory Visit: Admitting: Oncology

## 2024-01-30 ENCOUNTER — Encounter: Payer: Self-pay | Admitting: Oncology

## 2024-01-30 MED ORDER — HYDROMORPHONE HCL 4 MG PO TABS: 4.0000 mg | ORAL_TABLET | ORAL | 0 refills | Status: DC | PRN

## 2024-02-02 ENCOUNTER — Other Ambulatory Visit: Payer: Self-pay

## 2024-02-02 DIAGNOSIS — C9000 Multiple myeloma not having achieved remission: Secondary | ICD-10-CM

## 2024-02-03 ENCOUNTER — Ambulatory Visit: Admitting: Family Medicine

## 2024-02-03 ENCOUNTER — Inpatient Hospital Stay

## 2024-02-03 ENCOUNTER — Inpatient Hospital Stay: Attending: Oncology

## 2024-02-03 ENCOUNTER — Inpatient Hospital Stay (HOSPITAL_BASED_OUTPATIENT_CLINIC_OR_DEPARTMENT_OTHER): Admitting: Nurse Practitioner

## 2024-02-03 ENCOUNTER — Other Ambulatory Visit: Payer: Self-pay

## 2024-02-03 VITALS — BP 142/73 | HR 60 | Temp 97.4°F | Resp 20 | Wt 142.0 lb

## 2024-02-03 DIAGNOSIS — Z7952 Long term (current) use of systemic steroids: Secondary | ICD-10-CM | POA: Diagnosis not present

## 2024-02-03 DIAGNOSIS — C9001 Multiple myeloma in remission: Secondary | ICD-10-CM

## 2024-02-03 DIAGNOSIS — Z8049 Family history of malignant neoplasm of other genital organs: Secondary | ICD-10-CM | POA: Diagnosis not present

## 2024-02-03 DIAGNOSIS — Z9884 Bariatric surgery status: Secondary | ICD-10-CM | POA: Diagnosis not present

## 2024-02-03 DIAGNOSIS — D702 Other drug-induced agranulocytosis: Secondary | ICD-10-CM | POA: Diagnosis not present

## 2024-02-03 DIAGNOSIS — Z5112 Encounter for antineoplastic immunotherapy: Secondary | ICD-10-CM

## 2024-02-03 DIAGNOSIS — Z8719 Personal history of other diseases of the digestive system: Secondary | ICD-10-CM | POA: Insufficient documentation

## 2024-02-03 DIAGNOSIS — R634 Abnormal weight loss: Secondary | ICD-10-CM | POA: Diagnosis not present

## 2024-02-03 DIAGNOSIS — R197 Diarrhea, unspecified: Secondary | ICD-10-CM | POA: Insufficient documentation

## 2024-02-03 DIAGNOSIS — Z800 Family history of malignant neoplasm of digestive organs: Secondary | ICD-10-CM | POA: Diagnosis not present

## 2024-02-03 DIAGNOSIS — B338 Other specified viral diseases: Secondary | ICD-10-CM | POA: Insufficient documentation

## 2024-02-03 DIAGNOSIS — G893 Neoplasm related pain (acute) (chronic): Secondary | ICD-10-CM | POA: Insufficient documentation

## 2024-02-03 DIAGNOSIS — Z79891 Long term (current) use of opiate analgesic: Secondary | ICD-10-CM | POA: Diagnosis not present

## 2024-02-03 DIAGNOSIS — Z87891 Personal history of nicotine dependence: Secondary | ICD-10-CM | POA: Diagnosis not present

## 2024-02-03 DIAGNOSIS — C9000 Multiple myeloma not having achieved remission: Secondary | ICD-10-CM

## 2024-02-03 LAB — CBC WITH DIFFERENTIAL (CANCER CENTER ONLY)
Abs Immature Granulocytes: 0 K/uL (ref 0.00–0.07)
Basophils Absolute: 0 K/uL (ref 0.0–0.1)
Basophils Relative: 1 %
Eosinophils Absolute: 0 K/uL (ref 0.0–0.5)
Eosinophils Relative: 0 %
HCT: 31.4 % — ABNORMAL LOW (ref 36.0–46.0)
Hemoglobin: 10.5 g/dL — ABNORMAL LOW (ref 12.0–15.0)
Immature Granulocytes: 0 %
Lymphocytes Relative: 69 %
Lymphs Abs: 2.9 K/uL (ref 0.7–4.0)
MCH: 29.5 pg (ref 26.0–34.0)
MCHC: 33.4 g/dL (ref 30.0–36.0)
MCV: 88.2 fL (ref 80.0–100.0)
Monocytes Absolute: 0.3 K/uL (ref 0.1–1.0)
Monocytes Relative: 6 %
Neutro Abs: 1 K/uL — ABNORMAL LOW (ref 1.7–7.7)
Neutrophils Relative %: 24 %
Platelet Count: 250 K/uL (ref 150–400)
RBC: 3.56 MIL/uL — ABNORMAL LOW (ref 3.87–5.11)
RDW: 16.3 % — ABNORMAL HIGH (ref 11.5–15.5)
WBC Count: 4.2 K/uL (ref 4.0–10.5)
nRBC: 0 % (ref 0.0–0.2)

## 2024-02-03 LAB — CMP (CANCER CENTER ONLY)
ALT: 16 U/L (ref 0–44)
AST: 17 U/L (ref 15–41)
Albumin: 3.5 g/dL (ref 3.5–5.0)
Alkaline Phosphatase: 77 U/L (ref 38–126)
Anion gap: 7 (ref 5–15)
BUN: 16 mg/dL (ref 6–20)
CO2: 21 mmol/L — ABNORMAL LOW (ref 22–32)
Calcium: 8.6 mg/dL — ABNORMAL LOW (ref 8.9–10.3)
Chloride: 108 mmol/L (ref 98–111)
Creatinine: 1.04 mg/dL — ABNORMAL HIGH (ref 0.44–1.00)
GFR, Estimated: >60 mL/min (ref >60)
Glucose, Bld: 114 mg/dL — ABNORMAL HIGH (ref 70–99)
Potassium: 3.9 mmol/L (ref 3.5–5.1)
Sodium: 136 mmol/L (ref 135–145)
Total Bilirubin: 0.5 mg/dL (ref 0.0–1.2)
Total Protein: 6 g/dL — ABNORMAL LOW (ref 6.5–8.1)

## 2024-02-03 MED ORDER — ACETAMINOPHEN 325 MG PO TABS
650.0000 mg | ORAL_TABLET | Freq: Once | ORAL | Status: AC
  Administered 2024-02-03: 650 mg via ORAL
  Filled 2024-02-03: qty 2

## 2024-02-03 MED ORDER — DIPHENHYDRAMINE HCL 25 MG PO CAPS
50.0000 mg | ORAL_CAPSULE | Freq: Once | ORAL | Status: AC
  Administered 2024-02-03: 50 mg via ORAL
  Filled 2024-02-03: qty 2

## 2024-02-03 MED ORDER — LENALIDOMIDE 5 MG PO CAPS
ORAL_CAPSULE | ORAL | 4 refills | Status: DC
Start: 2024-02-03 — End: 2024-02-25

## 2024-02-03 MED ORDER — DARATUMUMAB-HYALURONIDASE-FIHJ 1800-30000 MG-UT/15ML ~~LOC~~ SOLN
1800.0000 mg | Freq: Once | SUBCUTANEOUS | Status: AC
  Administered 2024-02-03: 1800 mg via SUBCUTANEOUS
  Filled 2024-02-03: qty 15

## 2024-02-03 MED ORDER — DEXAMETHASONE 4 MG PO TABS
20.0000 mg | ORAL_TABLET | Freq: Once | ORAL | Status: AC
  Administered 2024-02-03: 20 mg via ORAL
  Filled 2024-02-03: qty 5

## 2024-02-03 NOTE — Progress Notes (Signed)
 Nutrition Follow-up:  Patient with multiple myeloma.  Receiving darzalex  and revlimid .    Met with patient during infusion.  Reports that her appetite is good.  Has been having diarrhea, multiple episodes.  Planning to start taking imodium  per NP.  Yesterday ate cheeseburger with baked potato for lunch and dinner was 2 slices of pizza and popcorn.  Does not really like breakfast.    Medications: imodium , protonix , thiamine , calcium , KCL, Vit D  Labs: Glucose 114, creatinine 1.04, calcium  8.6  Anthropometrics:   Weight 142 lb   149 lb 8 oz on 7/24 153 lb 5/21 157 lb on 4/2 174 lb on 1/13   NUTRITION DIAGNOSIS: Unintentional weight loss related to cancer treatment side effects (likely diarrhea) as evidenced by 5% weight loss in the last 3 weeks.     INTERVENTION:  Encouraged taking imodium  Discussed foods to choose with diarrhea.  Handout provided     MONITORING, EVALUATION, GOAL: weight trends, intake   NEXT VISIT: as needed Pt moving to [facility] B. Dasie SOLON, CSO, LDN Registered Dietitian [phone]

## 2024-02-03 NOTE — Progress Notes (Signed)
 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   She presented to the ER with symptoms of pleuritic chest wall pain and shortness of breath and underwent a CT angio chest which showed a soft tissue mass with bone destruction involving the left seventh rib as well as T6 and T7 vertebral bodies and central canal.  Possibility of cord compression.  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   Results of myeloma work-up from 06/20/2020 showed an elevated IgG of 3668.  M protein was elevated at 3.1 and IFE showed IgG monoclonal lambda protein.  Beta-2  microglobulin and LDH were normal.  Serum free light chain ratio was elevated at 25 with a free light chain lambda of 303.  Bone marrow biopsy showed increased plasma cells 22% by manual count and 20% by IHC staining for CD138.  Cytogenetics for myeloma showed gain of 1 q.   MRI cervical and lumbar  thoracic spine did not show any evidence of lytic lesions.  Bone survey was negative for bone lesions as well.  MRI pelvis with and without contrast showed diffuse patchy heterogeneous marrow concerning for myeloma    Patient had a repeat bone marrow biopsy inAugust 2024 which showed 20% overall plasma cells and hypercellular marrow consistent with plasma cell myeloma.  Cytogenetics normal and FISH studies did not show any abnormalities.  Patient had stabilization procedure/spinal fusion T4-T9 with T6-T7 laminotomies.  Spinal mass biopsy was consistent with plasma cell neoplasm. PET CT scan showed solitary hypermetabolic lytic lesion in the L4 vertebral body consistent with multiple myeloma.  Residual soft tissue density at the left paraspinal resection site T6-T7 with no significant metabolic activity.   Plan was to do outpatient daratumumab  Revlimid  Velcade  dexamethasone  regimen.  However patient was found to be in acute kidney failure with a creatinine of 5 in August 2024 and was sent to the ER.  She received cycle 1 of CyBorD in the hospital with improvement in her creatinine with stabilized between 1.5-1.7.  Treatment has been complicated by repeated hospitalizations for acute hypoxic respiratory failure and left pleural effusion which has been treated with antibiotics and thoracentesis.  Patient only received 1 dose of denosumab  in November 2024 and subsequently had to be held due to significant hypocalcemia.   Subsequently patient's clinical condition improved and she started D VRD regimen as per [facility] protocol on 03/31/2023.  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      Interval history- Patient returns to clinic for reevaluation and consideration of darzalex . She continues to feel well. Denies fevers, chills, night sweats. She  has chronic back pain which is unchanged. Weight is stable. Generally feels tired but not significantly changed. She is moving to [place], [place] later this month but plans to keep her care here at [facility].   ECOG PS- 1 Pain scale- 6 Opioid associated constipation- no  Review of systems- Review of Systems  Constitutional:  Positive for malaise/fatigue. Negative for chills, fever and weight loss.  HENT:  Negative for congestion, ear discharge and nosebleeds.   Eyes:  Negative for blurred vision.  Respiratory:  Negative for cough, hemoptysis, sputum production, shortness of breath and wheezing.   Cardiovascular:  Negative for chest pain, palpitations, orthopnea and claudication.  Gastrointestinal:  Negative for abdominal pain, blood in stool, constipation, diarrhea, heartburn, melena, nausea and vomiting.  Genitourinary:  Negative for dysuria, flank pain, frequency, hematuria and urgency.  Musculoskeletal:  Positive for back pain. Negative for joint pain and myalgias.  Skin:  Negative for rash.  Neurological:  Negative for dizziness, tingling, focal weakness, seizures, weakness and headaches.  Endo/Heme/Allergies:  Does not bruise/bleed easily.  Psychiatric/Behavioral:  Negative for depression and suicidal ideas. The patient does not have insomnia.     Allergies  Allergen Reactions   Hctz [Hydrochlorothiazide ]     Hyponatremia    Past Medical History:  Diagnosis Date   Anxiety    Arthritis    Asthma    Barrett's esophagus 2015   COVID-19    03/10/20   Depression    GERD (gastroesophageal reflux disease)    Hypertension    Hypothyroidism    Pneumonia    Smoldering myeloma    Thyroid  disease    Past Surgical History:  Procedure Laterality Date   ABLATION  2006   APPLICATION OF INTRAOPERATIVE CT SCAN N/A 01/28/2023   Procedure: APPLICATION OF INTRAOPERATIVE CT SCAN;  Surgeon: Claudene Penne Lin, MD;  Location: [facility] ORS;  Service: Neurosurgery;  Laterality: N/A;   BREAST CYST  ASPIRATION Left 1998   BREAST SURGERY     cyst aspiration   BREAST SURGERY  1998   cyst aspiration   COLONOSCOPY  03/2014   COLONOSCOPY WITH PROPOFOL  N/A 01/06/2020   Procedure: COLONOSCOPY WITH PROPOFOL ;  Surgeon: Toledo, Ladell POUR, MD;  Location: [facility] ENDOSCOPY;  Service: Gastroenterology;  Laterality: N/A;   ENDOMETRIAL ABLATION     ESOPHAGOGASTRODUODENOSCOPY (EGD) WITH PROPOFOL  N/A 08/15/2015   Procedure: ESOPHAGOGASTRODUODENOSCOPY (EGD) WITH PROPOFOL ;  Surgeon: Gladis RAYMOND Mariner, MD;  Location: [facility] ENDOSCOPY;  Service: Endoscopy;  Laterality: N/A;   ESOPHAGOGASTRODUODENOSCOPY (EGD) WITH PROPOFOL  N/A 01/22/2016   Procedure: ESOPHAGOGASTRODUODENOSCOPY (EGD) WITH PROPOFOL ;  Surgeon: Gladis RAYMOND Mariner, MD;  Location: [facility] ENDOSCOPY;  Service: Endoscopy;  Laterality: N/A;   ESOPHAGOGASTRODUODENOSCOPY (EGD) WITH PROPOFOL  N/A 01/06/2020   Procedure: ESOPHAGOGASTRODUODENOSCOPY (EGD) WITH PROPOFOL ;  Surgeon: Toledo, Ladell POUR, MD;  Location: [facility] ENDOSCOPY;  Service: Gastroenterology;  Laterality: N/A;   FLEXIBLE BRONCHOSCOPY Bilateral 03/13/2023   Procedure: FLEXIBLE BRONCHOSCOPY;  Surgeon: Isaiah Scrivener, MD;  Location: [facility] ORS;  Service: Cardiopulmonary;  Laterality: Bilateral;   GASTRIC BYPASS  2005   HERNIA REPAIR     IR BONE MARROW BIOPSY & ASPIRATION  02/05/2023   NASAL SINUS SURGERY     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   TOTAL THYROIDECTOMY  TUBAL LIGATION     Social History   Socioeconomic History   Marital status: Divorced    Spouse name: Not on file   Number of children: 2   Years of education: Not on file   Highest education level: GED or equivalent  Occupational History   Occupation: Part-time  Tobacco Use   Smoking status: Former    Types: Cigarettes   Smokeless tobacco: Never   Tobacco comments:    Quit smoking 02/16/2023  Vaping Use   Vaping status: Never Used  Substance and Sexual Activity   Alcohol use: Yes    Alcohol/week: 10.0 - 12.0 standard  drinks of alcohol    Types: 8 - 10 Glasses of wine, 2 Standard drinks or equivalent per week   Drug use: No   Sexual activity: Yes    Partners: Male  Other Topics Concern   Not on file  Social History Narrative   Lives in [place] single, divorced       Work - 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       Diet - healthy diet   Exercise - limited   Caffeine  use: daily   Social Drivers of Corporate investment banker Strain: Low Risk  (11/01/2022)   Overall Financial Resource Strain (CARDIA)    Difficulty of Paying Living Expenses: Not very hard  Food Insecurity: No Food Insecurity (08/26/2023)   Hunger Vital Sign    Worried About Running Out of Food in the Last Year: Never true    Ran Out of Food in the Last Year: Never true  Transportation Needs: No Transportation Needs (08/26/2023)   PRAPARE - Administrator, Civil Service (Medical): No    Lack of Transportation (Non-Medical): No  Physical Activity: Insufficiently Active (11/01/2022)   Exercise Vital Sign    Days of Exercise per Week: 3 days    Minutes of Exercise per Session: 20 min  Stress: Stress Concern Present (03/04/2023)   [employer] of Occupational Health - Occupational Stress Questionnaire    Feeling of Stress : Very much  Social Connections: Socially Isolated (03/04/2023)   Social Connection and Isolation Panel    Frequency of Communication with Friends and Family: Never    Frequency of Social Gatherings with Friends and Family: Never    Attends Religious Services: Never    Database administrator or Organizations: No    Attends Banker Meetings: Never    Marital Status: Divorced  Catering manager Violence: Not At Risk (08/26/2023)   Humiliation, Afraid, Rape, and Kick questionnaire    Fear of Current or Ex-Partner: No    Emotionally Abused: No    Physically Abused: No    Sexually Abused: No   Family History  Problem Relation Age of Onset   Heart disease Mother    COPD Mother     Arthritis Mother    Cancer Mother        uterine   Lupus Mother    Anxiety disorder Mother    Depression Mother    Drug abuse Mother    COPD Father    Arthritis Father    Heart disease Father    Heart attack Father    Alcohol abuse Father    Heart disease Brother    Heart attack Brother    Drug abuse Brother    Anxiety disorder Brother    Depression Brother    Heart disease Brother    Cancer Brother  liver cancer age 15 y.o   Diabetes Maternal Grandmother    Diabetes Maternal Grandfather    Diabetes Paternal Grandmother    Diabetes Paternal Grandfather    Breast cancer Neg Hx     Current Outpatient Medications:    calcium  carbonate (OSCAL) 1500 (600 Ca) MG TABS tablet, Take 1,500 mg by mouth 2 (two) times daily with a meal., Disp: , Rfl:    clonazePAM  (KLONOPIN ) 1 MG tablet, TAKE 1 TABLET(1 MG) BY MOUTH TWICE DAILY, Disp: 60 tablet, Rfl: 2   escitalopram  (LEXAPRO ) 10 MG tablet, Take 1 tablet (10 mg total) by mouth daily., Disp: 30 tablet, Rfl: 2   gabapentin  (NEURONTIN ) 300 MG capsule, Take 300 mg by mouth., Disp: , Rfl:    HYDROmorphone  (DILAUDID ) 4 MG tablet, Take 1 tablet (4 mg total) by mouth every 4 (four) hours as needed for severe pain (pain score 7-10)., Disp: 60 tablet, Rfl: 0   ipratropium-albuterol  (DUONEB) 0.5-2.5 (3) MG/3ML SOLN, Take 3 mLs by nebulization 4 (four) times daily., Disp: 360 mL, Rfl: 0   lenalidomide  (REVLIMID ) 10 MG capsule, TAKE 1 CAPSULE DAILY FOR 3 WEEKS ON AND THEN HOLD FOR 1 WEEK [id number], obtain on 01/23/2024, Disp: 21 capsule, Rfl: 0   levothyroxine  (SYNTHROID ) 125 MCG tablet, Take 2 tablets (250 mcg total) by mouth daily before breakfast., Disp: 60 tablet, Rfl: 0   pantoprazole  (PROTONIX ) 40 MG tablet, 30 min before lunch or dinner, Disp: 90 tablet, Rfl: 3   potassium chloride  SA (KLOR-CON  M) 20 MEQ tablet, Take 20 mEq by mouth 2 (two) times daily., Disp: , Rfl:    Spacer/Aero-Holding Chambers (AEROCHAMBER MV) inhaler, Use as  instructed, Disp: 1 each, Rfl: 0   Specialty Vitamins Products (MG PLUS PROTEIN) 133 MG TABS, Take 1 tablet by mouth every morning., Disp: , Rfl:    thiamine  (VITAMIN B-1) 100 MG tablet, Take 1 tablet (100 mg total) by mouth daily., Disp: 30 tablet, Rfl: 0   butalbital -acetaminophen -caffeine  (FIORICET ) 50-325-40 MG tablet, TAKE 1 TABLET BY MOUTH EVERY 4 HOURS AS NEEDED FOR HEADACHE (Patient not taking: Reported on 02/03/2024), Disp: 14 tablet, Rfl: 3   dexamethasone  (DECADRON ) 4 MG tablet, Take 5 tablets (20 mg total) by mouth 2 (two) times daily with a meal. (Patient not taking: Reported on 02/03/2024), Disp: 25 tablet, Rfl: 0   zolpidem  (AMBIEN ) 10 MG tablet, TAKE 1/2 TO 1 TABLET(5 TO 10 MG) BY MOUTH AT BEDTIME AS NEEDED FOR SLEEP (Patient not taking: Reported on 01/08/2024), Disp: 30 tablet, Rfl: 2  Physical exam:  There were no vitals filed for this visit.  Physical Exam Constitutional:      Appearance: She is not ill-appearing.  Cardiovascular:     Rate and Rhythm: Normal rate and regular rhythm.     Heart sounds: Normal heart sounds.  Pulmonary:     Effort: Pulmonary effort is normal.  Abdominal:     General: There is no distension.     Tenderness: There is no abdominal tenderness.  Musculoskeletal:        General: No tenderness.  Skin:    General: Skin is warm and dry.     Coloration: Skin is not pale.  Neurological:     Mental Status: She is alert and oriented to person, place, and time.  Psychiatric:        Mood and Affect: Mood normal.        Behavior: Behavior normal.     I have personally reviewed labs listed below:  Latest Ref Rng & Units 01/08/2024    8:05 AM  CMP  Glucose 70 - 99 mg/dL 98   BUN 6 - 20 mg/dL 14   Creatinine 9.55 - 1.00 mg/dL 9.01   Sodium 864 - 854 mmol/L 136   Potassium 3.5 - 5.1 mmol/L 4.1   Chloride 98 - 111 mmol/L 108   CO2 22 - 32 mmol/L 21   Calcium  8.9 - 10.3 mg/dL 8.8   Total Protein 6.5 - 8.1 g/dL 5.6   Total Bilirubin 0.0 - 1.2  mg/dL 0.5   Alkaline Phos 38 - 126 U/L 72   AST 15 - 41 U/L 23   ALT 0 - 44 U/L 20       Latest Ref Rng & Units 02/03/2024    8:53 AM  CBC  WBC 4.0 - 10.5 K/uL 4.2   Hemoglobin 12.0 - 15.0 g/dL 89.4   Hematocrit 63.9 - 46.0 % 31.4   Platelets 150 - 400 K/uL 250    I have personally reviewed Radiology images listed below: No results found.    Assessment and plan- Patient is a 58 y.o. female who returns to clinic for follow up of:   History of standard risk R-ISS stage II IgG lambda multiple myeloma - s/p induction D-VRD regimen followed by autologous stem cell transplantation.  Now on maintenance darzalez + revlimid . She received Darzalex  on 01/08/24 with Dr Babara. Due to decreased neutrophils, revlimid  was adjusted to 10 mg, 2 weeks on as opposed to 3 weeks on for this cycle. She continues aspirin  and acyclovir  prophylaxis. Labs reviewed and discussed with patient. ANC 1.0 today. Recommend reducing revlidmid to 5 mg, 3 weeks on, 1 week off when combined with darzalex . Plan to repeat this for a few cycles and monitor her counts. Discussed with Dr Melanee. Proceed with cycle 3 darzalex -revlimid  today.  Neutropenia- resolved. ANC today is 1.0.  Neoplasm related bone pain- not presently on bisphosphonates given history of significant hypocalcemia after 1 dose 04/21/23. On dilaudid  prn for pain. Tolerating well. Pain well controlled. No OIC.  Weight loss- referred to [facility]. Diarrhea- possibly related to darzalex  (15%). Occurring 8 times a day. Advised to take imodium  2 tablets with first loose stool then 1 tablet for each subsequent loose stool. Hold if diarrhea resolves.  Hypocalcemia- recommend calcium  1200 mg and vitamin D  1000 mcg daily.   Disposition:  Follow up per IS- la  Visit Diagnosis 1. Multiple myeloma in remission (HCC)   2. Encounter for monoclonal antibody treatment for malignancy   3. Drug-induced neutropenia (HCC)     Tinnie Dawn, DNP, AGNP-C, [facility] [phone] (clinic) 02/03/2024

## 2024-02-03 NOTE — Telephone Encounter (Signed)
 Outbound call to [facility] per Tinnie Dasie Walterine Jereld- could you please help get this patient revlimid  5 mg asap? She had 10 mg capsules shipped but we need to dose reduce; confirmed same schedule 3 weeks on / 1 week off.  Spoke to [place], Tinnie Allen's not showing in [facility].   Obtained verbal from Dr. Melanee. Melonie says he will override this order since the 10mg  was just shipped out to day.  New Authorization number obtained [id number].  Representative advised for patient to contact them in regards to the return process and to also remind them to complete their survey every 6 months.  Last survey completed May 19,2025, next survey due 04/12/24.  Representative is Also leaving a comment on this interaction as well indicating that when she calls in she can ask to speak with [place].

## 2024-02-03 NOTE — Progress Notes (Signed)
 Ok to treat with anc 1.0 per Tinnie Dawn ARNP

## 2024-02-04 LAB — KAPPA/LAMBDA LIGHT CHAINS
Kappa free light chain: 15.5 mg/L (ref 3.3–19.4)
Kappa, lambda light chain ratio: 1.53 (ref 0.26–1.65)
Lambda free light chains: 10.1 mg/L (ref 5.7–26.3)

## 2024-02-05 ENCOUNTER — Ambulatory Visit
Admission: RE | Admit: 2024-02-05 | Discharge: 2024-02-05 | Disposition: A | Discharge status: Home / Self Care | Source: Ambulatory Visit | Attending: Family Medicine | Admitting: Family Medicine

## 2024-02-05 DIAGNOSIS — Z1231 Encounter for screening mammogram for malignant neoplasm of breast: Secondary | ICD-10-CM | POA: Diagnosis present

## 2024-02-05 LAB — MULTIPLE MYELOMA PANEL, SERUM
Albumin SerPl Elph-Mcnc: 3.1 g/dL (ref 2.9–4.4)
Albumin/Glob SerPl: 1.3 (ref 0.7–1.7)
Alpha 1: 0.3 g/dL (ref 0.0–0.4)
Alpha2 Glob SerPl Elph-Mcnc: 0.7 g/dL (ref 0.4–1.0)
B-Globulin SerPl Elph-Mcnc: 0.8 g/dL (ref 0.7–1.3)
Gamma Glob SerPl Elph-Mcnc: 0.6 g/dL (ref 0.4–1.8)
Globulin, Total: 2.4 g/dL (ref 2.2–3.9)
IgA: 27 mg/dL — ABNORMAL LOW (ref 87–352)
IgG (Immunoglobin G), Serum: 598 mg/dL (ref 586–1602)
IgM (Immunoglobulin M), Srm: 25 mg/dL — ABNORMAL LOW (ref 26–217)
Total Protein ELP: 5.5 g/dL — ABNORMAL LOW (ref 6.0–8.5)

## 2024-02-11 ENCOUNTER — Encounter: Payer: Self-pay | Admitting: Oncology

## 2024-02-12 ENCOUNTER — Encounter: Payer: Self-pay | Admitting: Oncology

## 2024-02-12 MED ORDER — HYDROMORPHONE HCL 4 MG PO TABS: 4.0000 mg | ORAL_TABLET | ORAL | 0 refills | Status: DC | PRN

## 2024-02-25 ENCOUNTER — Other Ambulatory Visit: Payer: Self-pay | Admitting: Oncology

## 2024-02-25 ENCOUNTER — Encounter: Payer: Self-pay | Admitting: Oncology

## 2024-02-25 DIAGNOSIS — C9000 Multiple myeloma not having achieved remission: Secondary | ICD-10-CM

## 2024-02-25 MED ORDER — LENALIDOMIDE 5 MG PO CAPS: ORAL_CAPSULE | ORAL | 3 refills | Status: DC

## 2024-02-25 NOTE — Addendum Note (Signed)
 Addended by: FAUSTINO BENCE on: 02/25/2024 09:00 AM   Modules accepted: Orders

## 2024-02-27 ENCOUNTER — Encounter: Payer: Self-pay | Admitting: Oncology

## 2024-02-27 MED ORDER — HYDROMORPHONE HCL 4 MG PO TABS: 4.0000 mg | ORAL_TABLET | ORAL | 0 refills | Status: DC | PRN

## 2024-03-01 ENCOUNTER — Encounter: Payer: Self-pay | Admitting: Oncology

## 2024-03-01 ENCOUNTER — Other Ambulatory Visit: Payer: Self-pay | Admitting: Oncology

## 2024-03-01 DIAGNOSIS — C9001 Multiple myeloma in remission: Secondary | ICD-10-CM

## 2024-03-01 NOTE — Telephone Encounter (Signed)
 Pt has no f/u appts scheduled in the future. I wou like to see her next week for darzalex .  Labs CBC with differential CMP myeloma panel and serum free light chains.  Mercedes okay to refill her Dilaudid  if she is due

## 2024-03-01 NOTE — Addendum Note (Signed)
 Addended by: FAUSTINO BENCE on: 03/01/2024 04:19 PM   Modules accepted: Orders

## 2024-03-09 ENCOUNTER — Encounter: Payer: Self-pay | Admitting: Oncology

## 2024-03-09 ENCOUNTER — Inpatient Hospital Stay

## 2024-03-09 ENCOUNTER — Inpatient Hospital Stay: Admitting: Oncology

## 2024-03-09 ENCOUNTER — Inpatient Hospital Stay: Attending: Oncology

## 2024-03-09 VITALS — BP 120/72 | HR 97 | Resp 18

## 2024-03-09 VITALS — BP 116/87 | HR 63 | Temp 95.6°F | Resp 19 | Ht 62.0 in | Wt 149.8 lb

## 2024-03-09 DIAGNOSIS — G8929 Other chronic pain: Secondary | ICD-10-CM

## 2024-03-09 DIAGNOSIS — Z8049 Family history of malignant neoplasm of other genital organs: Secondary | ICD-10-CM | POA: Diagnosis not present

## 2024-03-09 DIAGNOSIS — G893 Neoplasm related pain (acute) (chronic): Secondary | ICD-10-CM | POA: Insufficient documentation

## 2024-03-09 DIAGNOSIS — Z800 Family history of malignant neoplasm of digestive organs: Secondary | ICD-10-CM | POA: Diagnosis not present

## 2024-03-09 DIAGNOSIS — Z79899 Other long term (current) drug therapy: Secondary | ICD-10-CM | POA: Insufficient documentation

## 2024-03-09 DIAGNOSIS — Z7952 Long term (current) use of systemic steroids: Secondary | ICD-10-CM | POA: Diagnosis not present

## 2024-03-09 DIAGNOSIS — C9001 Multiple myeloma in remission: Secondary | ICD-10-CM

## 2024-03-09 DIAGNOSIS — Z5111 Encounter for antineoplastic chemotherapy: Secondary | ICD-10-CM

## 2024-03-09 DIAGNOSIS — Z8719 Personal history of other diseases of the digestive system: Secondary | ICD-10-CM | POA: Diagnosis not present

## 2024-03-09 DIAGNOSIS — Z79891 Long term (current) use of opiate analgesic: Secondary | ICD-10-CM | POA: Diagnosis not present

## 2024-03-09 DIAGNOSIS — M549 Dorsalgia, unspecified: Secondary | ICD-10-CM | POA: Insufficient documentation

## 2024-03-09 DIAGNOSIS — C9000 Multiple myeloma not having achieved remission: Secondary | ICD-10-CM

## 2024-03-09 DIAGNOSIS — Z9884 Bariatric surgery status: Secondary | ICD-10-CM | POA: Diagnosis not present

## 2024-03-09 LAB — CMP (CANCER CENTER ONLY)
ALT: 14 U/L (ref 0–44)
AST: 17 U/L (ref 15–41)
Albumin: 3.3 g/dL — ABNORMAL LOW (ref 3.5–5.0)
Alkaline Phosphatase: 86 U/L (ref 38–126)
Anion gap: 7 (ref 5–15)
BUN: 9 mg/dL (ref 6–20)
CO2: 26 mmol/L (ref 22–32)
Calcium: 8.3 mg/dL — ABNORMAL LOW (ref 8.9–10.3)
Chloride: 100 mmol/L (ref 98–111)
Creatinine: 1.03 mg/dL — ABNORMAL HIGH (ref 0.44–1.00)
GFR, Estimated: >60 mL/min (ref >60)
Glucose, Bld: 99 mg/dL (ref 70–99)
Potassium: 3.6 mmol/L (ref 3.5–5.1)
Sodium: 133 mmol/L — ABNORMAL LOW (ref 135–145)
Total Bilirubin: 0.5 mg/dL (ref 0.0–1.2)
Total Protein: 5.7 g/dL — ABNORMAL LOW (ref 6.5–8.1)

## 2024-03-09 LAB — CBC WITH DIFFERENTIAL (CANCER CENTER ONLY)
Abs Immature Granulocytes: 0.01 K/uL (ref 0.00–0.07)
Basophils Absolute: 0 K/uL (ref 0.0–0.1)
Basophils Relative: 1 %
Eosinophils Absolute: 0.5 K/uL (ref 0.0–0.5)
Eosinophils Relative: 13 %
HCT: 34.7 % — ABNORMAL LOW (ref 36.0–46.0)
Hemoglobin: 11.4 g/dL — ABNORMAL LOW (ref 12.0–15.0)
Immature Granulocytes: 0 %
Lymphocytes Relative: 53 %
Lymphs Abs: 2.2 K/uL (ref 0.7–4.0)
MCH: 31.2 pg (ref 26.0–34.0)
MCHC: 32.9 g/dL (ref 30.0–36.0)
MCV: 95.1 fL (ref 80.0–100.0)
Monocytes Absolute: 0.2 K/uL (ref 0.1–1.0)
Monocytes Relative: 5 %
Neutro Abs: 1.2 K/uL — ABNORMAL LOW (ref 1.7–7.7)
Neutrophils Relative %: 28 %
Platelet Count: 216 K/uL (ref 150–400)
RBC: 3.65 MIL/uL — ABNORMAL LOW (ref 3.87–5.11)
RDW: 17.7 % — ABNORMAL HIGH (ref 11.5–15.5)
WBC Count: 4.1 K/uL (ref 4.0–10.5)
nRBC: 0 % (ref 0.0–0.2)

## 2024-03-09 MED ORDER — DIPHENHYDRAMINE HCL 25 MG PO CAPS
50.0000 mg | ORAL_CAPSULE | Freq: Once | ORAL | Status: AC
  Administered 2024-03-09: 50 mg via ORAL
  Filled 2024-03-09: qty 2

## 2024-03-09 MED ORDER — DEXAMETHASONE 4 MG PO TABS
20.0000 mg | ORAL_TABLET | Freq: Once | ORAL | Status: AC
  Administered 2024-03-09: 20 mg via ORAL
  Filled 2024-03-09: qty 5

## 2024-03-09 MED ORDER — ACETAMINOPHEN 325 MG PO TABS
650.0000 mg | ORAL_TABLET | Freq: Once | ORAL | Status: AC
  Administered 2024-03-09: 650 mg via ORAL
  Filled 2024-03-09: qty 2

## 2024-03-09 MED ORDER — DARATUMUMAB-HYALURONIDASE-FIHJ 1800-30000 MG-UT/15ML ~~LOC~~ SOLN
1800.0000 mg | Freq: Once | SUBCUTANEOUS | Status: AC
  Administered 2024-03-09: 1800 mg via SUBCUTANEOUS
  Filled 2024-03-09: qty 15

## 2024-03-09 NOTE — Progress Notes (Signed)
 Patient doing well. She is experiencing loose stools. She also wanted to know if she can get pain meds refilled.

## 2024-03-09 NOTE — Patient Instructions (Signed)
 [facility] - A DEPT OF [facility]  Discharge Instructions: Thank you for choosing [facility] to provide your oncology and hematology care.  If you have a lab appointment with the Cancer Center, please go directly to the Cancer Center and check in at the registration area.  Wear comfortable clothing and clothing appropriate for easy access to any Portacath or PICC line.   We strive to give you quality time with your provider. You may need to reschedule your appointment if you arrive late (15 or more minutes).  Arriving late affects you and other patients whose appointments are after yours.  Also, if you miss three or more appointments without notifying the office, you may be dismissed from the clinic at the provider's discretion.      For prescription refill requests, have your pharmacy contact our office and allow 72 hours for refills to be completed.    Today you received the following chemotherapy and/or immunotherapy agents Darzalex  injection      To help prevent nausea and vomiting after your treatment, we encourage you to take your nausea medication as directed.  BELOW ARE SYMPTOMS THAT SHOULD BE REPORTED IMMEDIATELY: *FEVER GREATER THAN 100.4 F (38 C) OR HIGHER *CHILLS OR SWEATING *NAUSEA AND VOMITING THAT IS NOT CONTROLLED WITH YOUR NAUSEA MEDICATION *UNUSUAL SHORTNESS OF BREATH *UNUSUAL BRUISING OR BLEEDING *URINARY PROBLEMS (pain or burning when urinating, or frequent urination) *BOWEL PROBLEMS (unusual diarrhea, constipation, pain near the anus) TENDERNESS IN MOUTH AND THROAT WITH OR WITHOUT PRESENCE OF ULCERS (sore throat, sores in mouth, or a toothache) UNUSUAL RASH, SWELLING OR PAIN  UNUSUAL VAGINAL DISCHARGE OR ITCHING   Items with * indicate a potential emergency and should be followed up as soon as possible or go to the Emergency Department if any problems should occur.  Please show the CHEMOTHERAPY ALERT CARD or  IMMUNOTHERAPY ALERT CARD at check-in to the Emergency Department and triage nurse.  Should you have questions after your visit or need to cancel or reschedule your appointment, please contact [facility] - A DEPT OF [facility]  [phone] and follow the prompts.  Office hours are 8:00 a.m. to 4:30 p.m. Monday - Friday. Please note that voicemails left after 4:00 p.m. may not be returned until the following business day.  We are closed weekends and major holidays. You have access to a nurse at all times for urgent questions. Please call the main number to the clinic [phone] and follow the prompts.  For any non-urgent questions, you may also contact your provider using MyChart. We now offer e-Visits for anyone 40 and older to request care online for non-urgent symptoms. For details visit [email or web address].   Also download the MyChart app! Go to the app store, search MyChart, open the app, select [facility], and log in with your MyChart username and password.

## 2024-03-09 NOTE — Progress Notes (Signed)
 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   She presented to the ER with symptoms of pleuritic chest wall pain and shortness of breath and underwent a CT angio chest which showed a soft tissue mass with bone destruction involving the left seventh rib as well as T6 and T7 vertebral bodies and central canal.  Possibility of cord compression.  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   Results of myeloma work-up from 06/20/2020 showed an elevated IgG of 3668.  M protein was elevated at 3.1 and IFE showed IgG monoclonal lambda protein.  Beta-2  microglobulin and LDH were normal.  Serum free light chain ratio was elevated at 25 with a free light chain lambda of 303.  Bone marrow biopsy showed increased plasma cells 22% by manual count and 20% by IHC staining for CD138.  Cytogenetics for myeloma showed  gain of 1 q.   MRI cervical and lumbar thoracic spine did not show any evidence of lytic lesions.  Bone survey was negative for bone lesions as well.  MRI pelvis with and without contrast showed diffuse patchy heterogeneous marrow concerning for myeloma    Patient had a repeat bone marrow biopsy inAugust 2024 which showed 20% overall plasma cells and hypercellular marrow consistent with plasma cell myeloma.  Cytogenetics normal and FISH studies did not show any abnormalities.  Patient had stabilization procedure/spinal fusion T4-T9 with T6-T7 laminotomies.  Spinal mass biopsy was consistent with plasma cell neoplasm. PET CT scan showed solitary hypermetabolic lytic lesion in the L4 vertebral body consistent with multiple myeloma.  Residual soft tissue density at the left paraspinal resection site T6-T7 with no significant metabolic activity.   Plan was to do outpatient daratumumab  Revlimid  Velcade  dexamethasone  regimen.  However patient was found to be in acute kidney failure with a creatinine of 5 in August 2024 and was sent to the ER.  She received cycle 1 of CyBorD in the hospital with improvement in her creatinine with stabilized between 1.5-1.7.  Treatment has been complicated by repeated hospitalizations for acute hypoxic respiratory failure and left pleural effusion which has been treated with antibiotics and thoracentesis.  Patient only received 1 dose of denosumab  in November 2024 and subsequently had to be held due to significant hypocalcemia.   Subsequently patient's clinical condition improved and she started D VRD regimen as per [facility] protocol on 03/31/2023.  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  She is on as needed Dilaudid .  Also has occasional diarrhea but has not used Imodium  consistently  ECOG PS- 1 Pain scale- 4 Opioid associated constipation- no  Review of systems- Review of Systems  Constitutional:  Negative for chills, fever, malaise/fatigue and weight loss.  HENT:  Negative for congestion, ear discharge and nosebleeds.   Eyes:  Negative for blurred vision.  Respiratory:  Negative for cough, hemoptysis, sputum production, shortness of breath and wheezing.   Cardiovascular:  Negative for chest pain, palpitations, orthopnea and claudication.  Gastrointestinal:  Positive for diarrhea. Negative for abdominal pain, blood in stool, constipation, heartburn, melena, nausea and vomiting.  Genitourinary:  Negative for dysuria, flank pain, frequency, hematuria and urgency.  Musculoskeletal:  Positive for back pain. Negative for joint pain and myalgias.  Skin:  Negative for rash.  Neurological:  Negative for dizziness, tingling, focal weakness, seizures, weakness and headaches.  Endo/Heme/Allergies:  Does not bruise/bleed easily.  Psychiatric/Behavioral:  Negative for depression and suicidal ideas. The patient does not have insomnia.       Allergies  Allergen Reactions   Hctz [Hydrochlorothiazide ]     Hyponatremia      Past Medical History:  Diagnosis Date   Anxiety    Arthritis    Asthma    Barrett's esophagus 2015   COVID-19    03/10/20   Depression    GERD (gastroesophageal reflux disease)    Hypertension    Hypothyroidism    Pneumonia    Smoldering myeloma    Thyroid  disease      Past Surgical History:  Procedure Laterality Date   ABLATION  2006   APPLICATION OF INTRAOPERATIVE CT SCAN N/A 01/28/2023   Procedure: APPLICATION OF INTRAOPERATIVE CT SCAN;  Surgeon: Claudene Penne Lin, MD;  Location: [facility] ORS;  Service: Neurosurgery;  Laterality: N/A;   BREAST CYST ASPIRATION Left 1998   BREAST SURGERY     cyst aspiration   BREAST  SURGERY  1998   cyst aspiration   COLONOSCOPY  03/2014   COLONOSCOPY WITH PROPOFOL  N/A 01/06/2020   Procedure: COLONOSCOPY WITH PROPOFOL ;  Surgeon: Toledo, Ladell POUR, MD;  Location: [facility] ENDOSCOPY;  Service: Gastroenterology;  Laterality: N/A;   ENDOMETRIAL ABLATION     ESOPHAGOGASTRODUODENOSCOPY (EGD) WITH PROPOFOL  N/A 08/15/2015   Procedure: ESOPHAGOGASTRODUODENOSCOPY (EGD) WITH PROPOFOL ;  Surgeon: Gladis RAYMOND Mariner, MD;  Location: [facility] ENDOSCOPY;  Service: Endoscopy;  Laterality: N/A;   ESOPHAGOGASTRODUODENOSCOPY (EGD) WITH PROPOFOL  N/A 01/22/2016   Procedure: ESOPHAGOGASTRODUODENOSCOPY (EGD) WITH PROPOFOL ;  Surgeon: Gladis RAYMOND Mariner, MD;  Location: [facility] ENDOSCOPY;  Service: Endoscopy;  Laterality: N/A;   ESOPHAGOGASTRODUODENOSCOPY (EGD) WITH PROPOFOL  N/A 01/06/2020   Procedure: ESOPHAGOGASTRODUODENOSCOPY (EGD) WITH PROPOFOL ;  Surgeon: Toledo, Ladell POUR, MD;  Location: [facility] ENDOSCOPY;  Service: Gastroenterology;  Laterality: N/A;   FLEXIBLE BRONCHOSCOPY Bilateral 03/13/2023   Procedure: FLEXIBLE BRONCHOSCOPY;  Surgeon: Isaiah Scrivener, MD;  Location: [facility] ORS;  Service: Cardiopulmonary;  Laterality: Bilateral;   GASTRIC BYPASS  2005   HERNIA REPAIR     IR BONE MARROW BIOPSY & ASPIRATION  02/05/2023   NASAL SINUS SURGERY     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   TOTAL THYROIDECTOMY     TUBAL LIGATION      Social History   Socioeconomic History  Marital status: Divorced    Spouse name: Not on file   Number of children: 2   Years of education: Not on file   Highest education level: GED or equivalent  Occupational History   Occupation: Part-time  Tobacco Use   Smoking status: Former    Types: Cigarettes   Smokeless tobacco: Never   Tobacco comments:    Quit smoking 02/16/2023  Vaping Use   Vaping status: Never Used  Substance and Sexual Activity   Alcohol use: Yes    Alcohol/week: 10.0 - 12.0 standard drinks of alcohol    Types: 8 - 10 Glasses of wine, 2 Standard drinks  or equivalent per week   Drug use: No   Sexual activity: Yes    Partners: Male  Other Topics Concern   Not on file  Social History Narrative   Lives in [place] single, divorced       Work - 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       Diet - healthy diet   Exercise - limited   Caffeine  use: daily   Social Drivers of Corporate investment banker Strain: Low Risk  (11/01/2022)   Overall Financial Resource Strain (CARDIA)    Difficulty of Paying Living Expenses: Not very hard  Food Insecurity: No Food Insecurity (02/13/2024)   Received from [facility]   Hunger Vital Sign    Within the past 12 months, you worried that your food would run out before you got the money to buy more.: Never true    Within the past 12 months, the food you bought just didn't last and you didn't have money to get more.: Never true  Transportation Needs: No Transportation Needs (02/13/2024)   Received from [facility] - Transportation    Lack of Transportation (Medical): No    Lack of Transportation (Non-Medical): No  Physical Activity: Insufficiently Active (11/01/2022)   Exercise Vital Sign    Days of Exercise per Week: 3 days    Minutes of Exercise per Session: 20 min  Stress: Stress Concern Present (03/04/2023)   [employer] of Occupational Health - Occupational Stress Questionnaire    Feeling of Stress : Very much  Social Connections: Socially Isolated (03/04/2023)   Social Connection and Isolation Panel    Frequency of Communication with Friends and Family: Never    Frequency of Social Gatherings with Friends and Family: Never    Attends Religious Services: Never    Database administrator or Organizations: No    Attends Banker Meetings: Never    Marital Status: Divorced  Catering manager Violence: Not At Risk (08/26/2023)   Humiliation, Afraid, Rape, and Kick questionnaire    Fear of Current or Ex-Partner: No    Emotionally Abused: No    Physically Abused: No     Sexually Abused: No    Family History  Problem Relation Age of Onset   Heart disease Mother    COPD Mother    Arthritis Mother    Cancer Mother        uterine   Lupus Mother    Anxiety disorder Mother    Depression Mother    Drug abuse Mother    COPD Father    Arthritis Father    Heart disease Father    Heart attack Father    Alcohol abuse Father    Heart disease Brother    Heart attack Brother    Drug abuse Brother  Anxiety disorder Brother    Depression Brother    Heart disease Brother    Cancer Brother        liver cancer age 19 y.o   Diabetes Maternal Grandmother    Diabetes Maternal Grandfather    Diabetes Paternal Grandmother    Diabetes Paternal Grandfather    Breast cancer Neg Hx      Current Outpatient Medications:    acyclovir  (ZOVIRAX ) 400 MG tablet, Take 400 mg by mouth 2 (two) times daily., Disp: , Rfl:    calcium  carbonate (OSCAL) 1500 (600 Ca) MG TABS tablet, Take 1,500 mg by mouth 2 (two) times daily with a meal., Disp: , Rfl:    cetirizine  (ZYRTEC ) 10 MG tablet, 1 tablet Orally Once a day; Duration: 30 day(s), Disp: , Rfl:    clonazePAM  (KLONOPIN ) 1 MG tablet, TAKE 1 TABLET(1 MG) BY MOUTH TWICE DAILY, Disp: 60 tablet, Rfl: 2   escitalopram  (LEXAPRO ) 10 MG tablet, Take 1 tablet (10 mg total) by mouth daily., Disp: 30 tablet, Rfl: 2   gabapentin  (NEURONTIN ) 300 MG capsule, Take 300 mg by mouth., Disp: , Rfl:    HYDROmorphone  (DILAUDID ) 4 MG tablet, Take 1 tablet (4 mg total) by mouth every 4 (four) hours as needed for severe pain (pain score 7-10)., Disp: 60 tablet, Rfl: 0   ipratropium-albuterol  (DUONEB) 0.5-2.5 (3) MG/3ML SOLN, Take 3 mLs by nebulization 4 (four) times daily., Disp: 360 mL, Rfl: 0   lenalidomide  (REVLIMID ) 5 MG capsule, TAKE 1 CAPSULE FOR 3 WEEKS ON AND 1 WEEK OFF, Disp: 21 capsule, Rfl: 3   levothyroxine  (SYNTHROID ) 125 MCG tablet, Take 2 tablets (250 mcg total) by mouth daily before breakfast., Disp: 60 tablet, Rfl: 0    loperamide  (IMODIUM ) 2 MG capsule, Take by mouth as needed., Disp: , Rfl:    montelukast  (SINGULAIR ) 10 MG tablet, 1 tablet Orally Once a day; Duration: 30 day(s), Disp: , Rfl:    pantoprazole  (PROTONIX ) 40 MG tablet, 30 min before lunch or dinner, Disp: 90 tablet, Rfl: 3   potassium chloride  SA (KLOR-CON  M) 20 MEQ tablet, Take 20 mEq by mouth 2 (two) times daily., Disp: , Rfl:    Spacer/Aero-Holding Chambers (AEROCHAMBER MV) inhaler, Use as instructed, Disp: 1 each, Rfl: 0   Specialty Vitamins Products (MG PLUS PROTEIN) 133 MG TABS, Take 1 tablet by mouth every morning., Disp: , Rfl:    thiamine  (VITAMIN B-1) 100 MG tablet, Take 1 tablet (100 mg total) by mouth daily., Disp: 30 tablet, Rfl: 0   venlafaxine  XR (EFFEXOR -XR) 150 MG 24 hr capsule, 1 capsule with food Orally Once a day; Duration: 30 day(s), Disp: , Rfl:    Vitamin D , Ergocalciferol , (DRISDOL ) 1.25 MG (50000 UNIT) CAPS capsule, Take 50,000 Units by mouth once a week., Disp: , Rfl:    zolpidem  (AMBIEN ) 10 MG tablet, TAKE 1/2 TO 1 TABLET(5 TO 10 MG) BY MOUTH AT BEDTIME AS NEEDED FOR SLEEP, Disp: 30 tablet, Rfl: 2   buPROPion  (WELLBUTRIN  XL) 150 MG 24 hr tablet, 1 tablet in the morning Orally Once a day; Duration: 30 day(s) (Patient not taking: Reported on 03/09/2024), Disp: , Rfl:    butalbital -acetaminophen -caffeine  (FIORICET ) 50-325-40 MG tablet, TAKE 1 TABLET BY MOUTH EVERY 4 HOURS AS NEEDED FOR HEADACHE (Patient not taking: Reported on 02/03/2024), Disp: 14 tablet, Rfl: 3   dexamethasone  (DECADRON ) 4 MG tablet, Take 5 tablets (20 mg total) by mouth 2 (two) times daily with a meal. (Patient not taking: Reported on 02/03/2024), Disp: 25  tablet, Rfl: 0   hydroxychloroquine  (PLAQUENIL ) 200 MG tablet, 1 tablet with food or milk Orally twice a day; Duration: 90 days, Disp: , Rfl:    lenalidomide  (REVLIMID ) 10 MG capsule, TAKE 1 CAPSULE DAILY FOR 3 WEEKS ON AND THEN HOLD FOR 1 WEEK [id number], obtain on 01/23/2024 (Patient not taking: Reported on  03/09/2024), Disp: 21 capsule, Rfl: 0   lisinopril -hydrochlorothiazide  (ZESTORETIC ) 20-12.5 MG tablet, 1 tablet Orally Once a day; Duration: 30 day(s) (Patient not taking: Reported on 03/09/2024), Disp: , Rfl:  No current facility-administered medications for this visit.  Facility-Administered Medications Ordered in Other Visits:    daratumumab -hyaluronidase -fihj (DARZALEX  FASPRO) 1800-30000 MG-UT/15ML chemo SQ injection 1,800 mg, 1,800 mg, Subcutaneous, Once, Melanee Annah BROCKS, MD  Physical exam:  Vitals:   03/09/24 1106  BP: 116/87  Pulse: 63  Resp: 19  Temp: (!) 95.6 F (35.3 C)  TempSrc: Tympanic  SpO2: 100%  Weight: 149 lb 12.8 oz (67.9 kg)  Height: 5' 2 (1.575 m)   Physical Exam Cardiovascular:     Rate and Rhythm: Normal rate and regular rhythm.     Heart sounds: Normal heart sounds.  Pulmonary:     Effort: Pulmonary effort is normal.     Breath sounds: Normal breath sounds.  Abdominal:     General: Bowel sounds are normal.     Palpations: Abdomen is soft.  Skin:    General: Skin is warm and dry.  Neurological:     Mental Status: She is alert and oriented to person, place, and time.      I have personally reviewed labs listed below:    Latest Ref Rng & Units 03/09/2024   10:38 AM  CMP  Glucose 70 - 99 mg/dL 99   BUN 6 - 20 mg/dL 9   Creatinine 9.55 - 8.99 mg/dL 8.96   Sodium 864 - 854 mmol/L 133   Potassium 3.5 - 5.1 mmol/L 3.6   Chloride 98 - 111 mmol/L 100   CO2 22 - 32 mmol/L 26   Calcium  8.9 - 10.3 mg/dL 8.3   Total Protein 6.5 - 8.1 g/dL 5.7   Total Bilirubin 0.0 - 1.2 mg/dL 0.5   Alkaline Phos 38 - 126 U/L 86   AST 15 - 41 U/L 17   ALT 0 - 44 U/L 14       Latest Ref Rng & Units 03/09/2024   10:38 AM  CBC  WBC 4.0 - 10.5 K/uL 4.1   Hemoglobin 12.0 - 15.0 g/dL 88.5   Hematocrit 63.9 - 46.0 % 34.7   Platelets 150 - 400 K/uL 216       Assessment and plan- Patient is a 58 y.o. female with history of standard risk R-ISS stage II IgG lambda multiple  myeloma s/p induction D VRD regimen followed by autoLog a stem cell transplantation.  She is here for on treatment assessment prior to cycle 4 of maintenance Darzalex  as per [facility] protocol  Counts okay to proceed with cycle 4 of Darzalex  today.  She will also take Revlimid   5 mg 3 weeks on and 1 week off.  Dose was reduced from 10 mg due to neutropenia.  Given that she has standard risk disease and her myeloma panel from a month ago continues to show that she is in remission with no evidence of M protein and normal free light chain ratio I will be stopping Darzalex  altogether at this time and continue her with single agent Revlimid .  Based on her counts in  3 months I will see if I can put her on 5 mg daily without any breaks  She will continue with aspirin  and acyclovir  prophylaxis at this time but I will consider dropping acyclovir  prophylaxis in 3 months since we are stopping Darzalex .  Myeloma panel and serum free light chains from today are pending.  Chronic back pain: Secondary to myeloma involvement of her T-spine s/p surgery.  I am renewing her Dilaudid  today but I have encouraged her to rely less on her narcotic pain medications and we will see if we can slowly wean down on her dosage when I see her back in 3 months   Visit Diagnosis 1. Multiple myeloma in remission (HCC)   2. Encounter for antineoplastic chemotherapy   3. High risk medication use   4. Chronic mid back pain      Dr. Annah Skene, MD, MPH [facility] [phone] 03/09/2024 12:32 PM

## 2024-03-10 LAB — KAPPA/LAMBDA LIGHT CHAINS
Kappa free light chain: 28.6 mg/L — ABNORMAL HIGH (ref 3.3–19.4)
Kappa, lambda light chain ratio: 1.44 (ref 0.26–1.65)
Lambda free light chains: 19.8 mg/L (ref 5.7–26.3)

## 2024-03-11 ENCOUNTER — Other Ambulatory Visit: Payer: Self-pay

## 2024-03-11 LAB — MULTIPLE MYELOMA PANEL, SERUM
Albumin SerPl Elph-Mcnc: 3 g/dL (ref 2.9–4.4)
Albumin/Glob SerPl: 1.4 (ref 0.7–1.7)
Alpha 1: 0.3 g/dL (ref 0.0–0.4)
Alpha2 Glob SerPl Elph-Mcnc: 0.7 g/dL (ref 0.4–1.0)
B-Globulin SerPl Elph-Mcnc: 0.8 g/dL (ref 0.7–1.3)
Gamma Glob SerPl Elph-Mcnc: 0.5 g/dL (ref 0.4–1.8)
Globulin, Total: 2.3 g/dL (ref 2.2–3.9)
IgA: 41 mg/dL — ABNORMAL LOW (ref 87–352)
IgG (Immunoglobin G), Serum: 590 mg/dL (ref 586–1602)
IgM (Immunoglobulin M), Srm: 22 mg/dL — ABNORMAL LOW (ref 26–217)
Total Protein ELP: 5.3 g/dL — ABNORMAL LOW (ref 6.0–8.5)

## 2024-03-12 ENCOUNTER — Encounter: Payer: Self-pay | Admitting: Oncology

## 2024-03-12 ENCOUNTER — Telehealth: Payer: Self-pay

## 2024-03-12 MED ORDER — HYDROMORPHONE HCL 4 MG PO TABS: 4.0000 mg | ORAL_TABLET | ORAL | 0 refills | Status: DC | PRN

## 2024-03-12 NOTE — Telephone Encounter (Signed)
 Script sent to [facility] location as requested.

## 2024-03-15 ENCOUNTER — Other Ambulatory Visit (HOSPITAL_COMMUNITY): Payer: Self-pay

## 2024-03-15 ENCOUNTER — Telehealth: Payer: Self-pay | Admitting: Pharmacy Technician

## 2024-03-15 NOTE — Telephone Encounter (Signed)
 Oral Oncology Patient Advocate Encounter   Re-authorization   Received notification that prior authorization for Lenalidomide  5 mg is required.   PA submitted via phone to Sherleen (unable to submit via [facility]) Case # [id number] Status is pending     Dontavion Noxon (Patty) Chet Burnet, CPhT  [facility], Zelda Salmon, [facility] - Oral Chemotherapy Patient Advocate Specialist III Phone: [phone]  Fax: [phone]

## 2024-03-15 NOTE — Telephone Encounter (Signed)
 Oral Oncology Patient Advocate Encounter  Prior Authorization for Lenalidomide  5 mg has been approved.    Authorization # [id number] Effective dates: 03/28/2024 through 03/28/2025  Patient can continue to fill at [facility].   [name] Chet Burnet, CPhT  [facility], Zelda Salmon, [facility] - Oral Chemotherapy Patient Advocate Specialist III Phone: [phone]  Fax: [phone]

## 2024-03-15 NOTE — Addendum Note (Signed)
 Addended by: RODGERS RENAEE SAILOR on: 03/15/2024 12:59 PM   Modules accepted: Orders

## 2024-03-25 ENCOUNTER — Other Ambulatory Visit: Payer: Self-pay | Admitting: Oncology

## 2024-03-26 ENCOUNTER — Encounter: Payer: Self-pay | Admitting: Oncology

## 2024-03-26 MED ORDER — HYDROMORPHONE HCL 4 MG PO TABS: 4.0000 mg | ORAL_TABLET | ORAL | 0 refills | Status: DC | PRN

## 2024-04-05 ENCOUNTER — Encounter: Payer: Self-pay | Admitting: Oncology

## 2024-04-07 ENCOUNTER — Encounter: Payer: Self-pay | Admitting: Oncology

## 2024-04-07 NOTE — Telephone Encounter (Signed)
 Per Dr. Melanee If she wants to slowly come off hydromorphone  she should be consuming less hydromorphone  each day and not ask for a refill exactly in 2 weeks. She is still taking about 4 tablets/day and I do not see how she is going to suddenly come off hydromorphone  if she thinks that this is going to be her last prescription.  Please talk to her before I refill the prescription.  Outbound call; Spoke to patient who indicated she is currently taking 1-2 tabs / daily (she did not provide a count but says she has a few pills on hand); she mentioned if she does not take the 1-2 tabs daily, she gets sick.  Reviewed with her the above and indicated this is not a prescription she can cut cold [place] per say.  Informed her I'd loop back with you and if you had any recommendations on how to go about tapering down I'd follow up with her.  Patient verbalized understanding.

## 2024-04-07 NOTE — Telephone Encounter (Signed)
 Per Dr. Melanee if she is taking 1-2 tab daily she should not be asking for renewal exactly in 2 weeks.  I will refill it on Friday.

## 2024-04-08 ENCOUNTER — Encounter: Payer: Self-pay | Admitting: Oncology

## 2024-04-08 MED ORDER — HYDROMORPHONE HCL 4 MG PO TABS: 4.0000 mg | ORAL_TABLET | ORAL | 0 refills | Status: AC | PRN

## 2024-04-16 ENCOUNTER — Encounter: Payer: Self-pay | Admitting: Oncology

## 2024-04-19 ENCOUNTER — Encounter: Payer: Self-pay | Admitting: Oncology

## 2024-04-19 ENCOUNTER — Other Ambulatory Visit: Payer: Self-pay | Admitting: Oncology

## 2024-04-19 DIAGNOSIS — G893 Neoplasm related pain (acute) (chronic): Secondary | ICD-10-CM

## 2024-04-19 DIAGNOSIS — C9000 Multiple myeloma not having achieved remission: Secondary | ICD-10-CM

## 2024-04-19 NOTE — Telephone Encounter (Signed)
 You can make the referral

## 2024-04-20 ENCOUNTER — Encounter: Payer: Self-pay | Admitting: Oncology

## 2024-04-21 ENCOUNTER — Encounter: Payer: Self-pay | Admitting: Oncology

## 2024-04-21 NOTE — Telephone Encounter (Signed)
 Outbound call to [facility] to ensure they received referral sent.  Spoke to Putnam who said she's checking with the coordinator, Gwenith did confirm that the coordinator has the referral but it has not been processed as of yet.

## 2024-04-27 ENCOUNTER — Other Ambulatory Visit: Payer: Self-pay | Admitting: Oncology

## 2024-05-21 ENCOUNTER — Encounter: Payer: Self-pay | Admitting: Oncology

## 2024-05-21 ENCOUNTER — Other Ambulatory Visit: Payer: Self-pay | Admitting: Oncology

## 2024-05-21 NOTE — Telephone Encounter (Signed)
 Yes ok to stop acyclovir 

## 2024-05-21 NOTE — Telephone Encounter (Signed)
 You can just restart a new cycle at this time

## 2024-05-25 ENCOUNTER — Other Ambulatory Visit: Payer: Self-pay | Admitting: Oncology

## 2024-06-15 ENCOUNTER — Other Ambulatory Visit: Payer: Self-pay | Admitting: Oncology

## 2024-06-18 ENCOUNTER — Other Ambulatory Visit: Payer: Self-pay | Admitting: Oncology

## 2024-06-18 DIAGNOSIS — C9000 Multiple myeloma not having achieved remission: Secondary | ICD-10-CM

## 2024-06-22 ENCOUNTER — Inpatient Hospital Stay: Admitting: Oncology

## 2024-06-22 ENCOUNTER — Inpatient Hospital Stay: Attending: Oncology

## 2024-06-22 ENCOUNTER — Encounter: Payer: Self-pay | Admitting: Oncology

## 2024-06-22 VITALS — BP 97/79 | HR 90 | Temp 97.6°F | Resp 18 | Wt 138.7 lb

## 2024-06-22 DIAGNOSIS — Z8719 Personal history of other diseases of the digestive system: Secondary | ICD-10-CM | POA: Insufficient documentation

## 2024-06-22 DIAGNOSIS — Z8049 Family history of malignant neoplasm of other genital organs: Secondary | ICD-10-CM | POA: Diagnosis not present

## 2024-06-22 DIAGNOSIS — E876 Hypokalemia: Secondary | ICD-10-CM | POA: Insufficient documentation

## 2024-06-22 DIAGNOSIS — D649 Anemia, unspecified: Secondary | ICD-10-CM | POA: Insufficient documentation

## 2024-06-22 DIAGNOSIS — Z87891 Personal history of nicotine dependence: Secondary | ICD-10-CM | POA: Insufficient documentation

## 2024-06-22 DIAGNOSIS — Z79899 Other long term (current) drug therapy: Secondary | ICD-10-CM | POA: Insufficient documentation

## 2024-06-22 DIAGNOSIS — Z9884 Bariatric surgery status: Secondary | ICD-10-CM | POA: Diagnosis not present

## 2024-06-22 DIAGNOSIS — Z7952 Long term (current) use of systemic steroids: Secondary | ICD-10-CM | POA: Diagnosis not present

## 2024-06-22 DIAGNOSIS — Z800 Family history of malignant neoplasm of digestive organs: Secondary | ICD-10-CM | POA: Diagnosis not present

## 2024-06-22 DIAGNOSIS — C9001 Multiple myeloma in remission: Secondary | ICD-10-CM | POA: Insufficient documentation

## 2024-06-22 DIAGNOSIS — Z7982 Long term (current) use of aspirin: Secondary | ICD-10-CM | POA: Insufficient documentation

## 2024-06-22 DIAGNOSIS — E878 Other disorders of electrolyte and fluid balance, not elsewhere classified: Secondary | ICD-10-CM | POA: Insufficient documentation

## 2024-06-22 LAB — CMP (CANCER CENTER ONLY)
ALT: 84 U/L — ABNORMAL HIGH (ref 0–44)
AST: 34 U/L (ref 15–41)
Albumin: 3.7 g/dL (ref 3.5–5.0)
Alkaline Phosphatase: 181 U/L — ABNORMAL HIGH (ref 38–126)
Anion gap: 10 (ref 5–15)
BUN: 13 mg/dL (ref 6–20)
CO2: 25 mmol/L (ref 22–32)
Calcium: 8.8 mg/dL — ABNORMAL LOW (ref 8.9–10.3)
Chloride: 108 mmol/L (ref 98–111)
Creatinine: 1.09 mg/dL — ABNORMAL HIGH (ref 0.44–1.00)
GFR, Estimated: 58 mL/min — ABNORMAL LOW
Glucose, Bld: 66 mg/dL — ABNORMAL LOW (ref 70–99)
Potassium: 3.3 mmol/L — ABNORMAL LOW (ref 3.5–5.1)
Sodium: 144 mmol/L (ref 135–145)
Total Bilirubin: 0.2 mg/dL (ref 0.0–1.2)
Total Protein: 5.8 g/dL — ABNORMAL LOW (ref 6.5–8.1)

## 2024-06-22 LAB — CBC WITH DIFFERENTIAL (CANCER CENTER ONLY)
Abs Immature Granulocytes: 0 K/uL (ref 0.00–0.07)
Basophils Absolute: 0 K/uL (ref 0.0–0.1)
Basophils Relative: 1 %
Eosinophils Absolute: 0 K/uL (ref 0.0–0.5)
Eosinophils Relative: 0 %
HCT: 28.7 % — ABNORMAL LOW (ref 36.0–46.0)
Hemoglobin: 9.3 g/dL — ABNORMAL LOW (ref 12.0–15.0)
Immature Granulocytes: 0 %
Lymphocytes Relative: 52 %
Lymphs Abs: 1.5 K/uL (ref 0.7–4.0)
MCH: 31.6 pg (ref 26.0–34.0)
MCHC: 32.4 g/dL (ref 30.0–36.0)
MCV: 97.6 fL (ref 80.0–100.0)
Monocytes Absolute: 0.3 K/uL (ref 0.1–1.0)
Monocytes Relative: 12 %
Neutro Abs: 1 K/uL — ABNORMAL LOW (ref 1.7–7.7)
Neutrophils Relative %: 35 %
Platelet Count: 137 K/uL — ABNORMAL LOW (ref 150–400)
RBC: 2.94 MIL/uL — ABNORMAL LOW (ref 3.87–5.11)
RDW: 19.8 % — ABNORMAL HIGH (ref 11.5–15.5)
WBC Count: 2.8 K/uL — ABNORMAL LOW (ref 4.0–10.5)
nRBC: 0 % (ref 0.0–0.2)

## 2024-06-22 NOTE — Progress Notes (Signed)
 "    Hematology/Oncology Consult note [facility]  [phone] [phone]  Patient Care Team: Edman Marsa PARAS, DO as PCP - General (Family Medicine) Dessa, Reyes ORN, MD (General Surgery) Vannie Delon LABOR, MD (Internal Medicine) Melanee Annah BROCKS, MD as Consulting Physician (Oncology) Babara Call, MD as Consulting Physician (Oncology)   Name of the patient: Paula Garrett  [id number]  January 11, 1966   Date of visit: 06/22/2024  Diagnosis-  Cancer Staging  Multiple myeloma (HCC) Staging form: Plasma Cell Myeloma and Plasma Cell Disorders, AJCC 8th Edition - Clinical stage from 03/05/2023: RISS Stage II (Beta-2 -microglobulin (mg/L): 2.5, Albumin  (g/dL): 2.1, ISS: Stage II, High-risk cytogenetics: Absent, LDH: Normal) - Signed by Melanee Annah BROCKS, MD on 03/23/2023 Stage prefix: Initial diagnosis Beta 2 microglobulin range (mg/L): Less than 3.5 Albumin  range (g/dL): Less than 3.5 Cytogenetics: No abnormalities    Chief complaint/ Reason for visit-routine follow-up of multiple myeloma in remission presently on maintenance Revlimid    Heme/Onc history:  Patient is a 59 year old female which was 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   She presented to the ER with symptoms of pleuritic chest wall pain and shortness of breath and underwent a CT angio chest which showed a soft tissue mass with bone destruction involving the left seventh rib as well as T6 and T7 vertebral bodies and central canal.  Possibility of cord compression.  This was followed by an MRI cervical and thoracic spine which showed a large 8.5 cm left paraspinal soft tissue mass in the T6-T7 with destruction of the seventh rib invasion of the T6-T7 vertebral bodies and involvement of posterior elements and epidural tumor in the spinal canal resulting in moderate spinal canal stenosis without frank cord compression or  edema.   Results of myeloma work-up from 06/20/2020 showed an elevated IgG of 3668.  M protein was elevated at 3.1 and IFE showed IgG monoclonal lambda protein.  Beta-2  microglobulin and LDH were normal.  Serum free light chain ratio was elevated at 25 with a free light chain lambda of 303.  Bone marrow biopsy showed increased plasma cells 22% by manual count and 20% by IHC staining for CD138.  Cytogenetics for myeloma showed gain of 1 q.   MRI cervical and lumbar thoracic spine did not show any evidence of lytic lesions.  Bone survey was negative for bone lesions as well.  MRI pelvis with and without contrast showed diffuse patchy heterogeneous marrow concerning for myeloma    Patient had a repeat bone marrow biopsy inAugust 2024 which showed 20% overall plasma cells and hypercellular marrow consistent with plasma cell myeloma.  Cytogenetics normal and FISH studies did not show any abnormalities.  Patient had stabilization procedure/spinal fusion T4-T9 with T6-T7 laminotomies.  Spinal mass biopsy was consistent with plasma cell neoplasm. PET CT scan showed solitary hypermetabolic lytic lesion in the L4 vertebral body consistent with multiple myeloma.  Residual soft tissue density at the left paraspinal resection site T6-T7 with no significant metabolic activity.   Plan was to do outpatient daratumumab  Revlimid  Velcade  dexamethasone  regimen.  However patient was found to be in acute kidney failure with a creatinine of 5 in August 2024 and was sent to the ER.  She received cycle 1 of CyBorD in the hospital with improvement in her creatinine with stabilized between 1.5-1.7.  Treatment has been complicated by repeated hospitalizations for acute hypoxic respiratory failure and left pleural effusion which has been treated with  antibiotics and thoracentesis.  Patient only received 1 dose of denosumab  in November 2024 and subsequently had to be held due to significant hypocalcemia.   Subsequently patient's clinical  condition improved and she started D VRD regimen as per [facility] protocol on 03/31/2023.  She received 4 cycles of D VRD chemotherapy and underwent autoLog a stem cell transplantation day 0 on 08/08/2023 at [facility].  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  She is on maintenance Revlimid   Interval history- Paula Garrett is a 59 year old female with multiple myeloma, status post DVRD induction and autologous bone marrow transplant, presenting for oncology follow-up during relocation to [place] .  She completed four to five cycles of DVRD chemotherapy followed by autologous bone marrow transplant and is currently on maintenance oral Revlimid  (5 mg, 21 days on/7 days off). She is no longer receiving Darzalex  and has discontinued acyclovir  for shingles prophylaxis as directed. She continues daily aspirin  and vitamins. She reports that she is not currently taking any pain medications. She awaits updated myeloma markers from today's visit.  She is mildly anemic with a hemoglobin of 9.3 g/dL. She was hospitalized over Thanksgiving for pneumonia, completed a course of prednisone , and is still recovering. Her potassium remains mildly low at 3.3 mmol/L after discontinuing supplementation during her hospitalization. Liver function tests are mildly abnormal. Kidney function is stable.     ECOG PS- 1 Pain scale- 0   Review of systems- Review of Systems  Constitutional:  Positive for malaise/fatigue. Negative for chills, fever and weight loss.  HENT:  Negative for congestion, ear discharge and nosebleeds.   Eyes:  Negative for blurred vision.  Respiratory:  Negative for cough, hemoptysis, sputum production, shortness of breath and wheezing.   Cardiovascular:  Negative for chest pain, palpitations, orthopnea and claudication.  Gastrointestinal:  Negative for abdominal pain, blood in stool, constipation, diarrhea, heartburn, melena, nausea and vomiting.   Genitourinary:  Negative for dysuria, flank pain, frequency, hematuria and urgency.  Musculoskeletal:  Negative for back pain, joint pain and myalgias.  Skin:  Negative for rash.  Neurological:  Negative for dizziness, tingling, focal weakness, seizures, weakness and headaches.  Endo/Heme/Allergies:  Does not bruise/bleed easily.  Psychiatric/Behavioral:  Negative for depression and suicidal ideas. The patient does not have insomnia.       Allergies[1]   Past Medical History:  Diagnosis Date   Anxiety    Arthritis    Asthma    Barrett's esophagus 2015   COVID-19    03/10/20   Depression    GERD (gastroesophageal reflux disease)    Hypertension    Hypothyroidism    Pneumonia    Smoldering myeloma    Thyroid  disease      Past Surgical History:  Procedure Laterality Date   ABLATION  2006   APPLICATION OF INTRAOPERATIVE CT SCAN N/A 01/28/2023   Procedure: APPLICATION OF INTRAOPERATIVE CT SCAN;  Surgeon: Claudene Penne Lin, MD;  Location: [facility] ORS;  Service: Neurosurgery;  Laterality: N/A;   BREAST CYST ASPIRATION Left 1998   BREAST SURGERY     cyst aspiration   BREAST SURGERY  1998   cyst aspiration   COLONOSCOPY  03/2014   COLONOSCOPY WITH PROPOFOL  N/A 01/06/2020   Procedure: COLONOSCOPY WITH PROPOFOL ;  Surgeon: Toledo, Ladell MARLA, MD;  Location: [facility] ENDOSCOPY;  Service: Gastroenterology;  Laterality: N/A;   ENDOMETRIAL ABLATION     ESOPHAGOGASTRODUODENOSCOPY (EGD) WITH PROPOFOL  N/A 08/15/2015   Procedure: ESOPHAGOGASTRODUODENOSCOPY (EGD) WITH PROPOFOL ;  Surgeon:  Gladis RAYMOND Mariner, MD;  Location: [facility] ENDOSCOPY;  Service: Endoscopy;  Laterality: N/A;   ESOPHAGOGASTRODUODENOSCOPY (EGD) WITH PROPOFOL  N/A 01/22/2016   Procedure: ESOPHAGOGASTRODUODENOSCOPY (EGD) WITH PROPOFOL ;  Surgeon: Gladis RAYMOND Mariner, MD;  Location: [facility] ENDOSCOPY;  Service: Endoscopy;  Laterality: N/A;   ESOPHAGOGASTRODUODENOSCOPY (EGD) WITH PROPOFOL  N/A 01/06/2020   Procedure:  ESOPHAGOGASTRODUODENOSCOPY (EGD) WITH PROPOFOL ;  Surgeon: Toledo, Ladell POUR, MD;  Location: [facility] ENDOSCOPY;  Service: Gastroenterology;  Laterality: N/A;   FLEXIBLE BRONCHOSCOPY Bilateral 03/13/2023   Procedure: FLEXIBLE BRONCHOSCOPY;  Surgeon: Isaiah Scrivener, MD;  Location: [facility] ORS;  Service: Cardiopulmonary;  Laterality: Bilateral;   GASTRIC BYPASS  2005   HERNIA REPAIR     IR BONE MARROW BIOPSY & ASPIRATION  02/05/2023   NASAL SINUS SURGERY     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   TOTAL THYROIDECTOMY     TUBAL LIGATION      Social History   Socioeconomic History   Marital status: Divorced    Spouse name: Not on file   Number of children: 2   Years of education: Not on file   Highest education level: GED or equivalent  Occupational History   Occupation: Part-time  Tobacco Use   Smoking status: Former    Types: Cigarettes   Smokeless tobacco: Never   Tobacco comments:    Quit smoking 02/16/2023  Vaping Use   Vaping status: Never Used  Substance and Sexual Activity   Alcohol use: Yes    Alcohol/week: 10.0 - 12.0 standard drinks of alcohol    Types: 8 - 10 Glasses of wine, 2 Standard drinks or equivalent per week   Drug use: No   Sexual activity: Yes    Partners: Male  Other Topics Concern   Not on file  Social History Narrative   Lives in [place] single, divorced       Work - 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       Diet - healthy diet   Exercise - limited   Caffeine  use: daily   Social Drivers of Health   Tobacco Use: Medium Risk (06/22/2024)   Patient History    Smoking Tobacco Use: Former    Smokeless Tobacco Use: Never    Passive Exposure: Not on Actuary Strain: Low Risk (11/01/2022)   Overall Financial Resource Strain (CARDIA)    Difficulty of Paying Living Expenses: Not very hard  Food Insecurity: No Food Insecurity (02/13/2024)   Received from [facility]   Epic    Within the past 12 months, you worried that your food would  run out before you got the money to buy more.: Never true    Within the past 12 months, the food you bought just didn't last and you didn't have money to get more.: Never true  Transportation Needs: No Transportation Needs (02/13/2024)   Received from [facility] - Transportation    Lack of Transportation (Medical): No    Lack of Transportation (Non-Medical): No  Physical Activity: Insufficiently Active (11/01/2022)   Exercise Vital Sign    Days of Exercise per Week: 3 days    Minutes of Exercise per Session: 20 min  Stress: Stress Concern Present (03/04/2023)   [employer] of [employer]ational [employer] - Occupational Stress Questionnaire    Feeling of Stress : Very much  Social Connections: Socially Isolated (03/04/2023)   Social Connection and Isolation Panel    Frequency of Communication with Friends and Family: Never  Frequency of Social Gatherings with Friends and Family: Never    Attends Religious Services: Never    Database Administrator or Organizations: No    Attends Banker Meetings: Never    Marital Status: Divorced  Catering Manager Violence: Not At Risk (08/26/2023)   Humiliation, Afraid, Rape, and Kick questionnaire    Fear of Current or Ex-Partner: No    Emotionally Abused: No    Physically Abused: No    Sexually Abused: No  Depression (PHQ2-9): Low Risk (03/09/2024)   Depression (PHQ2-9)    PHQ-2 Score: 0  Alcohol Screen: Low Risk (01/01/2023)   Alcohol Screen    Last Alcohol Screening Score (AUDIT): 5  Housing: Unknown (01/20/2024)   Received from [facility]   Epic    Unable to Pay for Housing in the Last Year: Not on file    Number of Times Moved in the Last Year: Not on file    At any time in the past 12 months, were you homeless or living in a shelter (including now)?: No  Utilities: Low Risk (02/13/2024)   Received from [facility]   Utilities    Within the past 12 months, have you been unable to get  utilities(heat, electricity) when it was really needed?: No  Health Literacy: Adequate Health Literacy (03/04/2023)   B1300 Health Literacy    Frequency of need for help with medical instructions: Never    Family History  Problem Relation Age of Onset   Heart disease Mother    COPD Mother    Arthritis Mother    Cancer Mother        uterine   Lupus Mother    Anxiety disorder Mother    Depression Mother    Drug abuse Mother    COPD Father    Arthritis Father    Heart disease Father    Heart attack Father    Alcohol abuse Father    Heart disease Brother    Heart attack Brother    Drug abuse Brother    Anxiety disorder Brother    Depression Brother    Heart disease Brother    Cancer Brother        liver cancer age 58 y.o   Diabetes Maternal Grandmother    Diabetes Maternal Grandfather    Diabetes Paternal Grandmother    Diabetes Paternal Grandfather    Breast cancer Neg Hx     Current Medications[2]  Physical exam:  Vitals:   06/22/24 1046  BP: 97/79  Pulse: 90  Resp: 18  Temp: 97.6 F (36.4 C)  SpO2: 96%  Weight: 138 lb 11.2 oz (62.9 kg)   Physical Exam Cardiovascular:     Rate and Rhythm: Normal rate and regular rhythm.     Heart sounds: Normal heart sounds.  Pulmonary:     Effort: Pulmonary effort is normal.     Breath sounds: Normal breath sounds.  Skin:    General: Skin is warm and dry.  Neurological:     Mental Status: She is alert and oriented to person, place, and time.      I have personally reviewed labs listed below:    Latest Ref Rng & Units 06/22/2024   10:29 AM  CMP  Glucose 70 - 99 mg/dL 66   BUN 6 - 20 mg/dL 13   Creatinine 9.55 - 1.00 mg/dL 8.90   Sodium 864 - 854 mmol/L 144   Potassium 3.5 - 5.1 mmol/L 3.3  Chloride 98 - 111 mmol/L 108   CO2 22 - 32 mmol/L 25   Calcium  8.9 - 10.3 mg/dL 8.8   Total Protein 6.5 - 8.1 g/dL 5.8   Total Bilirubin 0.0 - 1.2 mg/dL 0.2   Alkaline Phos 38 - 126 U/L 181   AST 15 - 41 U/L 34   ALT 0 -  44 U/L 84       Latest Ref Rng & Units 06/22/2024   10:29 AM  CBC  WBC 4.0 - 10.5 K/uL 2.8   Hemoglobin 12.0 - 15.0 g/dL 9.3   Hematocrit 63.9 - 46.0 % 28.7   Platelets 150 - 400 K/uL 137     Assessment and plan- Patient is a 59 y.o. 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  She is currently in VGPR on maintenance Revlimid  and this is a routine follow-up visit  Assessment and Plan    Multiple myeloma on maintenance therapy Multiple myeloma managed with DVRD chemotherapy and autologous transplant, now on lifelong Revlimid  maintenance. Requires ongoing therapy for disease control. Relocating to [place], [place], necessitating continued oncology follow-up and medication management. - Continued Revlimid  5 mg, 21 days on and 7 days off. - Discontinued Darzalex  injections and acyclovir . - Continued aspirin  for thromboprophylaxis. - Ordered myeloma panel; results pending. - Monitor myeloma markers every three months. - Discussed follow-up options: every three months at current center or transfer to [facility] cancer center.  She wishes to continue to follow-up with me -- Continued vitamin supplementation and psychiatric medications as managed by psychiatrist. - Repeat blood work in three months to monitor disease status and treatment effects.  Anemia Mild anemia with hemoglobin 9.3 g/dL, likely due to recent pneumonia - Monitor hemoglobin and CBC every three months. - Repeat blood work in three months to reassess anemia.  Hypokalemia Mild hypokalemia (potassium 3.3 mmol/L), previously advised to discontinue potassium supplementation during pneumonia hospitalization. - Restarted potassium supplementation, one tablet daily. - Monitor potassium levels with routine blood work. - Encouraged establishment of primary care in [place] for ongoing management of electrolyte abnormalities.       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  She had an episode of opioid overdose and was admitted to the hospital.  Subsequently her opioids have been stopped and she is not presently on any pain medications   Visit Diagnosis 1. Multiple myeloma in remission (HCC)   2. High risk medication use      Dr. Annah Skene, MD, MPH [facility] at [facility] [phone] 06/22/2024 1:03 PM                   [1]  Allergies Allergen Reactions   Hctz [Hydrochlorothiazide ]     Hyponatremia   [2]  Current Outpatient Medications:    acyclovir  (ZOVIRAX ) 400 MG tablet, Take 400 mg by mouth 2 (two) times daily., Disp: , Rfl:    calcium  carbonate (OSCAL) 1500 (600 Ca) MG TABS tablet, Take 1,500 mg by mouth 2 (two) times daily with a meal., Disp: , Rfl:    cetirizine  (ZYRTEC ) 10 MG tablet, 1 tablet Orally Once a day; Duration: 30 day(s), Disp: , Rfl:    clonazePAM  (KLONOPIN ) 1 MG tablet, TAKE 1 TABLET(1 MG) BY MOUTH TWICE DAILY, Disp: 60 tablet, Rfl: 2   doxycycline  (VIBRAMYCIN ) 100 MG capsule, Take 100 mg by mouth 2 (two)  times daily., Disp: , Rfl:    escitalopram  (LEXAPRO ) 10 MG tablet, Take 1 tablet (10 mg total) by mouth daily., Disp: 30 tablet, Rfl: 2   gabapentin  (NEURONTIN ) 300 MG capsule, Take 300 mg by mouth., Disp: , Rfl:    HYDROmorphone  (DILAUDID ) 4 MG tablet, Take 1 tablet (4 mg total) by mouth every 4 (four) hours as needed for severe pain (pain score 7-10)., Disp: 60 tablet, Rfl: 0   hydroxychloroquine  (PLAQUENIL ) 200 MG tablet, 1 tablet with food or milk Orally twice a day; Duration: 90 days, Disp: , Rfl:    ipratropium-albuterol  (DUONEB) 0.5-2.5 (3) MG/3ML SOLN, Take 3 mLs by nebulization 4 (four) times daily., Disp: 360 mL, Rfl: 0   lenalidomide  (REVLIMID ) 5 MG capsule, TAKE 1 CAPSULE FOR 3 WEEKS ON AND 1 WEEK OFF, Disp: 21 capsule, Rfl: 3   levothyroxine  (SYNTHROID ) 125 MCG tablet, Take 2 tablets (250 mcg total)  by mouth daily before breakfast., Disp: 60 tablet, Rfl: 0   loperamide  (IMODIUM ) 2 MG capsule, Take by mouth as needed., Disp: , Rfl:    montelukast  (SINGULAIR ) 10 MG tablet, 1 tablet Orally Once a day; Duration: 30 day(s), Disp: , Rfl:    pantoprazole  (PROTONIX ) 40 MG tablet, 30 min before lunch or dinner, Disp: 90 tablet, Rfl: 3   potassium chloride  SA (KLOR-CON  M) 20 MEQ tablet, Take 20 mEq by mouth 2 (two) times daily., Disp: , Rfl:    prochlorperazine  (COMPAZINE ) 10 MG tablet, TAKE 1 TABLET(10 MG) BY MOUTH EVERY 6 HOURS AS NEEDED FOR NAUSEA OR VOMITING, Disp: 30 tablet, Rfl: 2   Spacer/Aero-Holding Chambers (AEROCHAMBER MV) inhaler, Use as instructed, Disp: 1 each, Rfl: 0   Specialty Vitamins Products (MG PLUS PROTEIN) 133 MG TABS, Take 1 tablet by mouth every morning., Disp: , Rfl:    thiamine  (VITAMIN B-1) 100 MG tablet, Take 1 tablet (100 mg total) by mouth daily., Disp: 30 tablet, Rfl: 0   venlafaxine  XR (EFFEXOR -XR) 150 MG 24 hr capsule, 1 capsule with food Orally Once a day; Duration: 30 day(s), Disp: , Rfl:    Vitamin D , Ergocalciferol , (DRISDOL ) 1.25 MG (50000 UNIT) CAPS capsule, Take 50,000 Units by mouth once a week., Disp: , Rfl:    zolpidem  (AMBIEN ) 10 MG tablet, TAKE 1/2 TO 1 TABLET(5 TO 10 MG) BY MOUTH AT BEDTIME AS NEEDED FOR SLEEP, Disp: 30 tablet, Rfl: 2   buPROPion  (WELLBUTRIN  XL) 150 MG 24 hr tablet, 1 tablet in the morning Orally Once a day; Duration: 30 day(s) (Patient not taking: Reported on 06/22/2024), Disp: , Rfl:    butalbital -acetaminophen -caffeine  (FIORICET ) 50-325-40 MG tablet, TAKE 1 TABLET BY MOUTH EVERY 4 HOURS AS NEEDED FOR HEADACHE (Patient not taking: Reported on 06/22/2024), Disp: 14 tablet, Rfl: 3   dexamethasone  (DECADRON ) 4 MG tablet, Take 5 tablets (20 mg total) by mouth 2 (two) times daily with a meal. (Patient not taking: Reported on 06/22/2024), Disp: 25 tablet, Rfl: 0   lisinopril -hydrochlorothiazide  (ZESTORETIC ) 20-12.5 MG tablet, 1 tablet Orally Once a day;  Duration: 30 day(s) (Patient not taking: Reported on 06/22/2024), Disp: , Rfl:   "

## 2024-06-23 LAB — KAPPA/LAMBDA LIGHT CHAINS
Kappa free light chain: 28.3 mg/L — ABNORMAL HIGH (ref 3.3–19.4)
Kappa, lambda light chain ratio: 1.63 (ref 0.26–1.65)
Lambda free light chains: 17.4 mg/L (ref 5.7–26.3)

## 2024-06-24 LAB — MULTIPLE MYELOMA PANEL, SERUM
Albumin SerPl Elph-Mcnc: 2.8 g/dL — ABNORMAL LOW (ref 2.9–4.4)
Albumin/Glob SerPl: 1.2 (ref 0.7–1.7)
Alpha 1: 0.3 g/dL (ref 0.0–0.4)
Alpha2 Glob SerPl Elph-Mcnc: 0.8 g/dL (ref 0.4–1.0)
B-Globulin SerPl Elph-Mcnc: 0.8 g/dL (ref 0.7–1.3)
Gamma Glob SerPl Elph-Mcnc: 0.6 g/dL (ref 0.4–1.8)
Globulin, Total: 2.4 g/dL (ref 2.2–3.9)
IgA: 32 mg/dL — ABNORMAL LOW (ref 87–352)
IgG (Immunoglobin G), Serum: 610 mg/dL (ref 586–1602)
IgM (Immunoglobulin M), Srm: 27 mg/dL (ref 26–217)
M Protein SerPl Elph-Mcnc: 0.2 g/dL — ABNORMAL HIGH
Total Protein ELP: 5.2 g/dL — ABNORMAL LOW (ref 6.0–8.5)

## 2024-09-20 ENCOUNTER — Inpatient Hospital Stay

## 2024-09-20 ENCOUNTER — Inpatient Hospital Stay: Admitting: Oncology

## 2024-09-24 ENCOUNTER — Inpatient Hospital Stay: Admitting: Oncology

## 2024-09-24 ENCOUNTER — Inpatient Hospital Stay
